# Patient Record
Sex: Male | Born: 1970 | Race: White | Hispanic: No | Marital: Single | Smoking: Never smoker
Health system: Southern US, Community
[De-identification: ages and names within clinical notes are randomized; demographics above are authoritative.]

## PROBLEM LIST (undated history)

## (undated) DIAGNOSIS — I10 Essential (primary) hypertension: Secondary | ICD-10-CM

## (undated) DIAGNOSIS — M109 Gout, unspecified: Secondary | ICD-10-CM

## (undated) DIAGNOSIS — I879 Disorder of vein, unspecified: Secondary | ICD-10-CM

## (undated) DIAGNOSIS — I82409 Acute embolism and thrombosis of unspecified deep veins of unspecified lower extremity: Secondary | ICD-10-CM

## (undated) DIAGNOSIS — I509 Heart failure, unspecified: Secondary | ICD-10-CM

## (undated) DIAGNOSIS — M1990 Unspecified osteoarthritis, unspecified site: Secondary | ICD-10-CM

## (undated) DIAGNOSIS — M797 Fibromyalgia: Secondary | ICD-10-CM

## (undated) DIAGNOSIS — K589 Irritable bowel syndrome without diarrhea: Secondary | ICD-10-CM

## (undated) DIAGNOSIS — F32A Depression, unspecified: Secondary | ICD-10-CM

## (undated) DIAGNOSIS — I2510 Atherosclerotic heart disease of native coronary artery without angina pectoris: Secondary | ICD-10-CM

## (undated) DIAGNOSIS — E119 Type 2 diabetes mellitus without complications: Secondary | ICD-10-CM

## (undated) DIAGNOSIS — R0600 Dyspnea, unspecified: Secondary | ICD-10-CM

## (undated) DIAGNOSIS — F419 Anxiety disorder, unspecified: Secondary | ICD-10-CM

## (undated) DIAGNOSIS — G4730 Sleep apnea, unspecified: Secondary | ICD-10-CM

## (undated) DIAGNOSIS — Z87442 Personal history of urinary calculi: Secondary | ICD-10-CM

## (undated) DIAGNOSIS — J45909 Unspecified asthma, uncomplicated: Secondary | ICD-10-CM

## (undated) HISTORY — DX: Acute embolism and thrombosis of unspecified deep veins of unspecified lower extremity: I82.409

## (undated) HISTORY — PX: FOOT SURGERY: SHX648

---

## 2015-06-02 DIAGNOSIS — F4321 Adjustment disorder with depressed mood: Secondary | ICD-10-CM | POA: Insufficient documentation

## 2015-06-02 DIAGNOSIS — E669 Obesity, unspecified: Secondary | ICD-10-CM | POA: Insufficient documentation

## 2015-06-02 DIAGNOSIS — J4530 Mild persistent asthma, uncomplicated: Secondary | ICD-10-CM | POA: Insufficient documentation

## 2015-06-02 DIAGNOSIS — M1A09X0 Idiopathic chronic gout, multiple sites, without tophus (tophi): Secondary | ICD-10-CM | POA: Insufficient documentation

## 2015-06-02 DIAGNOSIS — M797 Fibromyalgia: Secondary | ICD-10-CM | POA: Insufficient documentation

## 2015-06-02 DIAGNOSIS — E119 Type 2 diabetes mellitus without complications: Secondary | ICD-10-CM | POA: Insufficient documentation

## 2015-06-02 DIAGNOSIS — K580 Irritable bowel syndrome with diarrhea: Secondary | ICD-10-CM | POA: Insufficient documentation

## 2015-07-23 DIAGNOSIS — M109 Gout, unspecified: Secondary | ICD-10-CM | POA: Insufficient documentation

## 2015-07-23 DIAGNOSIS — R0683 Snoring: Secondary | ICD-10-CM | POA: Insufficient documentation

## 2015-07-23 DIAGNOSIS — I10 Essential (primary) hypertension: Secondary | ICD-10-CM | POA: Insufficient documentation

## 2015-07-23 DIAGNOSIS — K2100 Gastro-esophageal reflux disease with esophagitis, without bleeding: Secondary | ICD-10-CM | POA: Insufficient documentation

## 2015-07-23 DIAGNOSIS — G4733 Obstructive sleep apnea (adult) (pediatric): Secondary | ICD-10-CM | POA: Insufficient documentation

## 2015-11-30 IMAGING — US US EXTREM LOW VENOUS*L*
1 series · 13 of 24 positions shown · non-contrast
Comparison: None.

ADDENDUM:
The initially dictated report fail to include a description of the
mid aspect of the left greater saphenous vein where there are
internal echoes and only partial compressibility consistent with the
presence of thrombus.
CLINICAL DATA: Diffuse left leg pain for 6 years which has recently
worsened with swelling.



[Series 1: us extrem low venous*left* · 0.08mm/px · 13 of 31 slices shown]
[im 1/31]
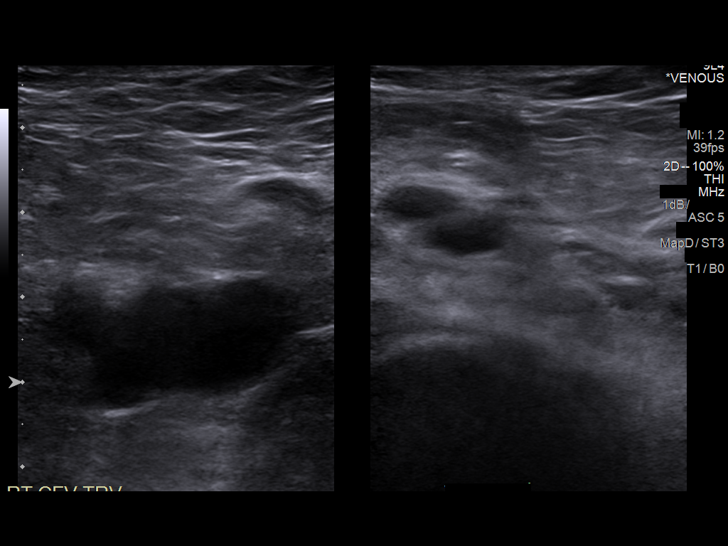
[im 3/31]
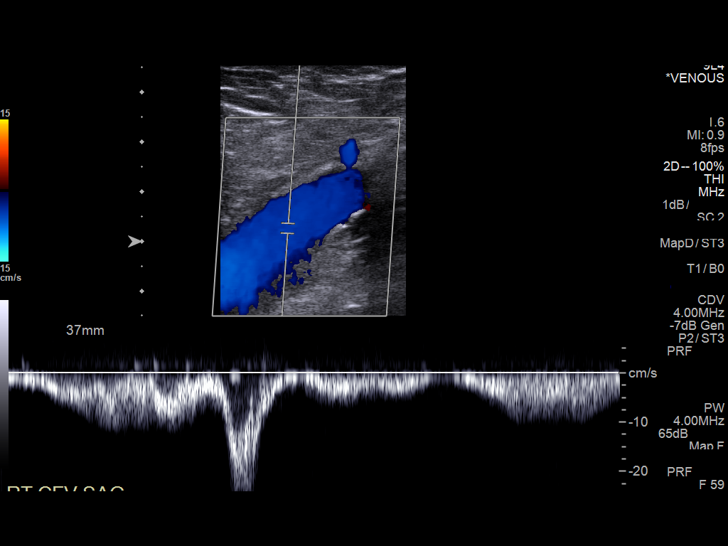
[im 6/31]
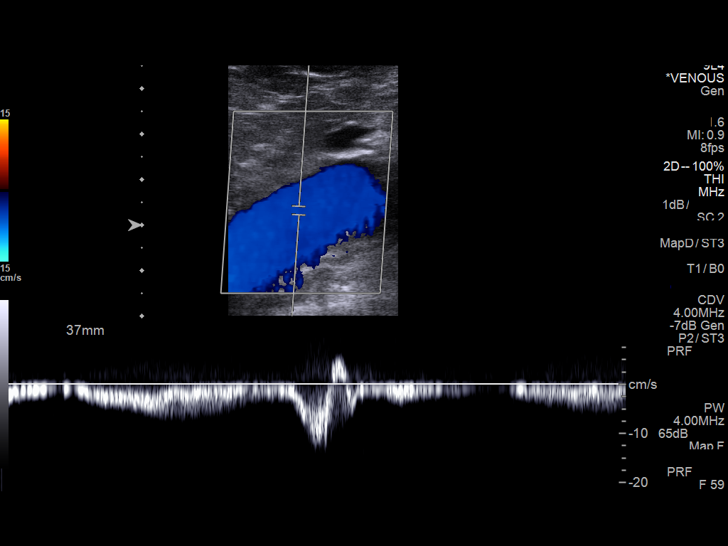
[im 8/31]
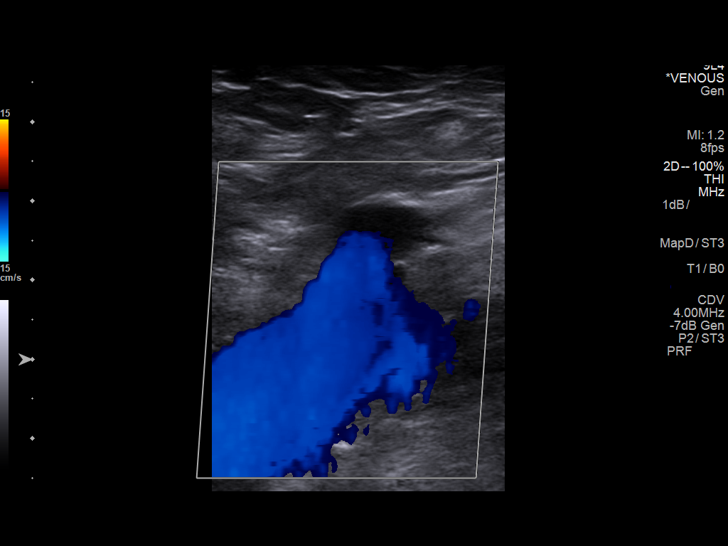
[im 11/31]
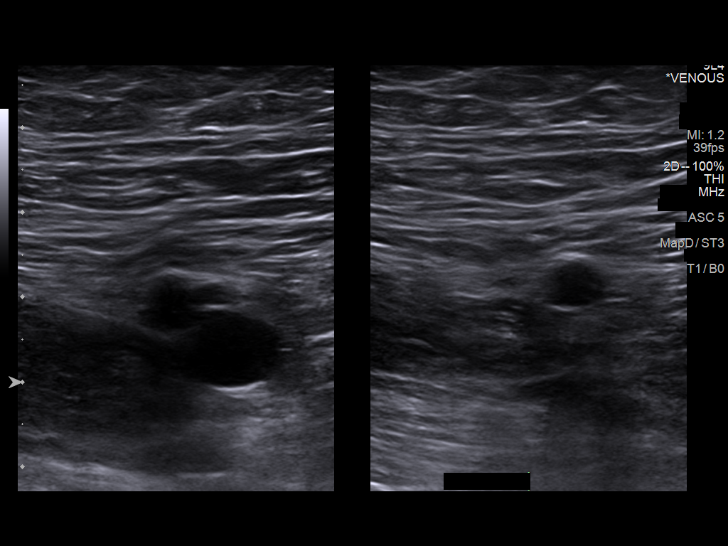
[im 14/31]
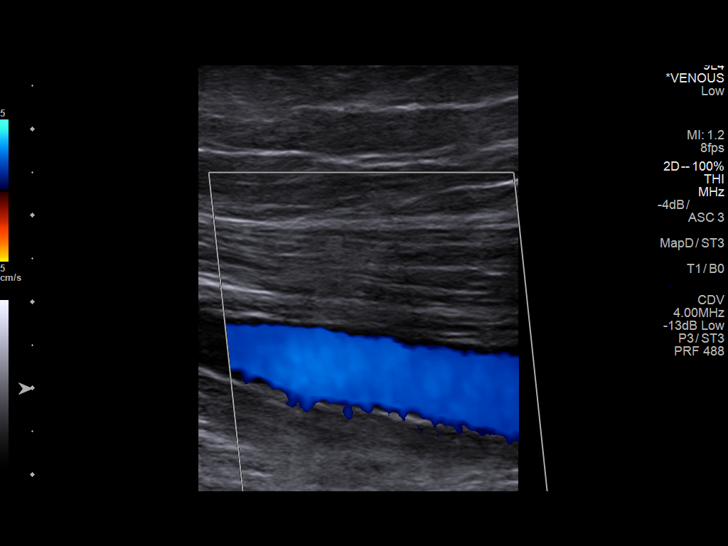
[im 16/31]
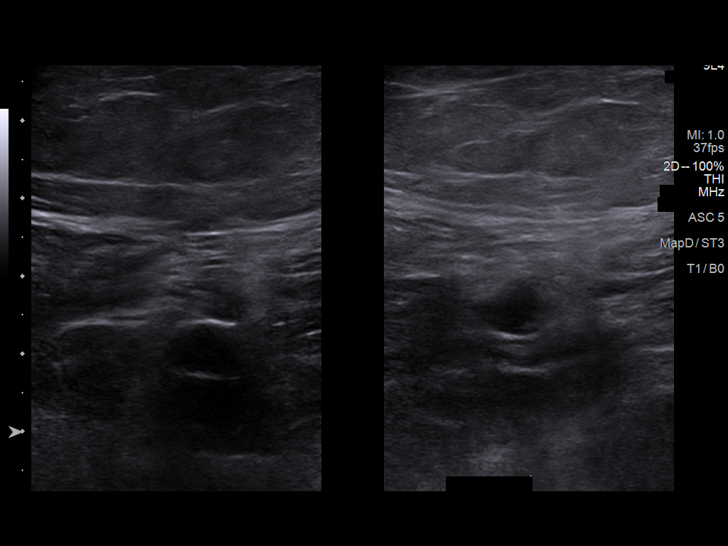
[im 17/31]
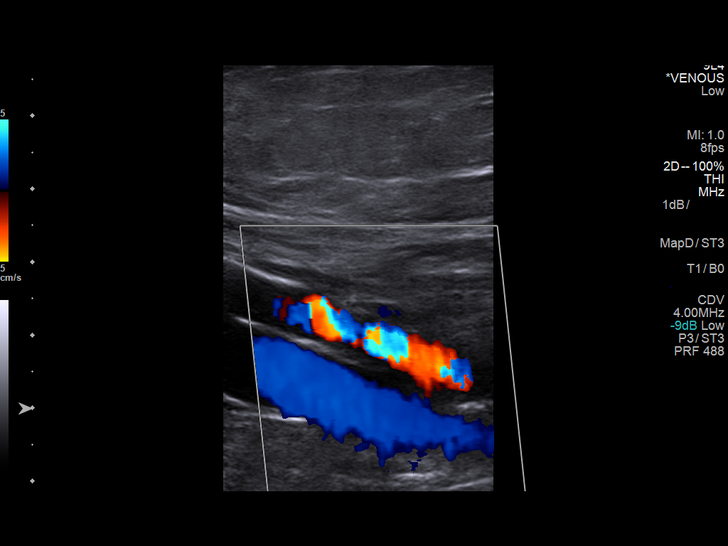
[im 20/31]
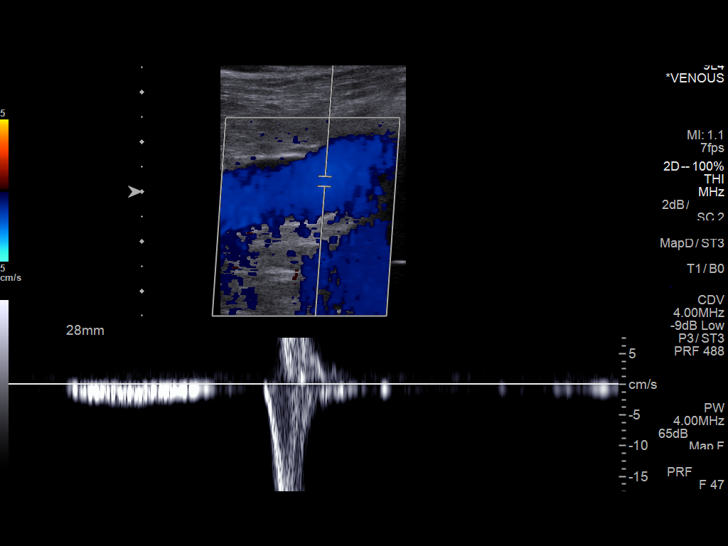
[im 23/31]
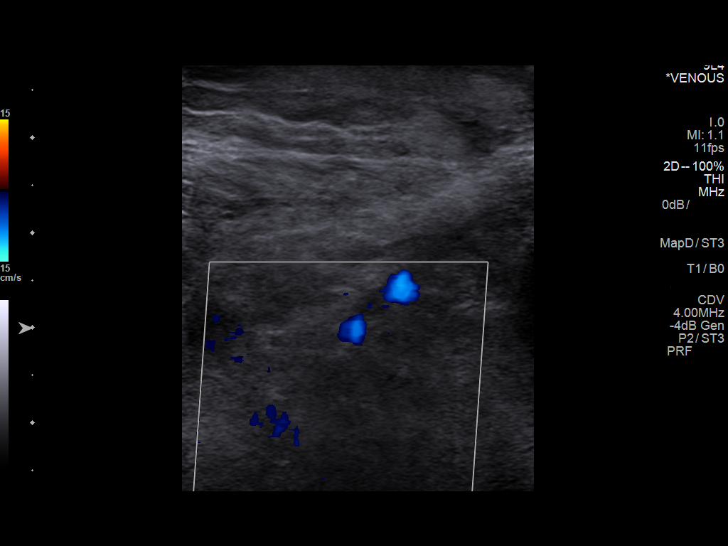
[im 25/31]
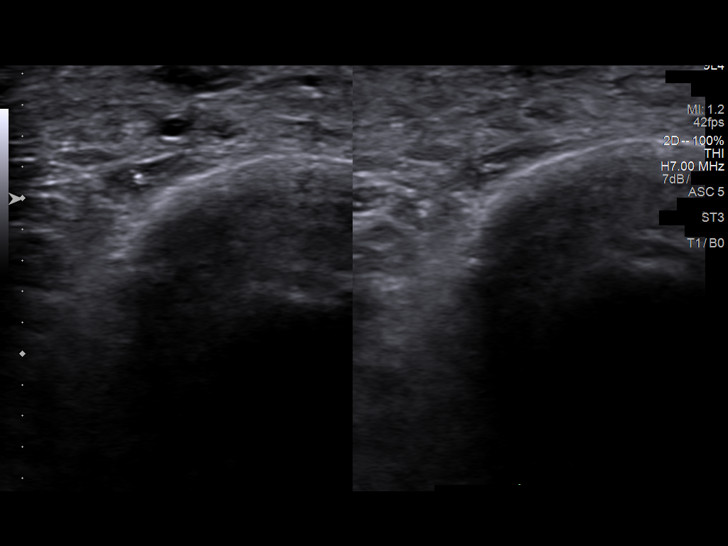
[im 28/31]
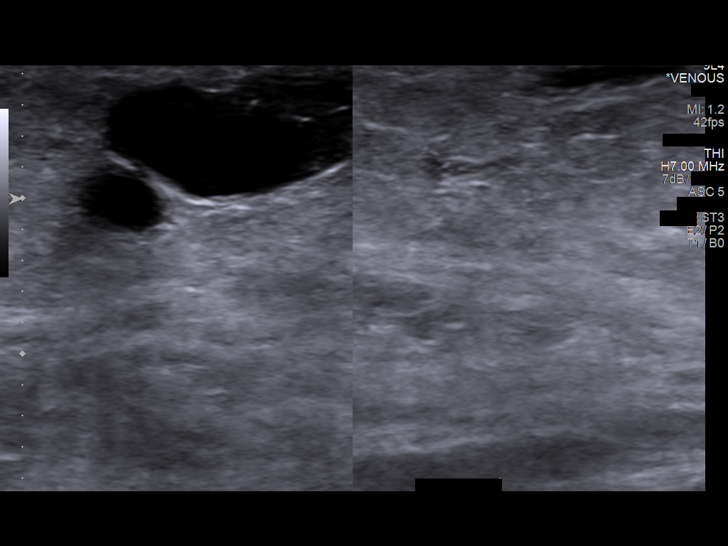
[im 31/31]
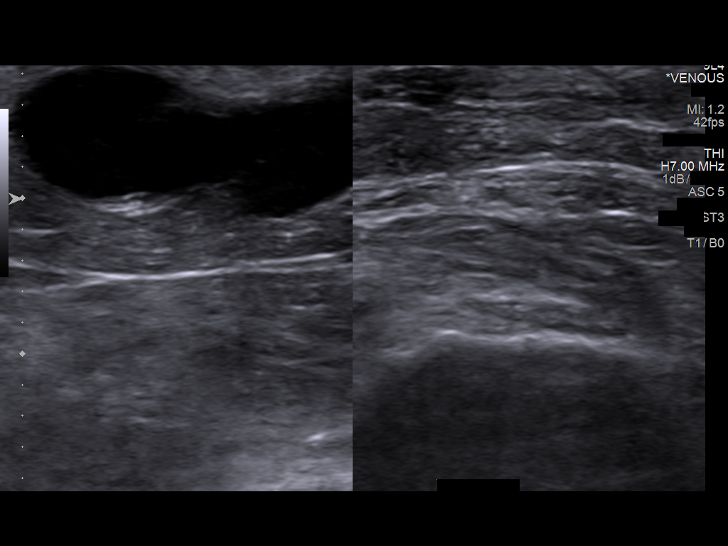

[13 of 24 positions shown; findings below may reference images not displayed]

FINDINGS: Contralateral Common Femoral Vein: Respiratory phasicity is normal
and symmetric with the symptomatic side. No evidence of thrombus.
Normal compressibility.

Common Femoral Vein: No evidence of thrombus. Normal
compressibility, respiratory phasicity and response to augmentation.

Saphenofemoral Junction: No evidence of thrombus. Normal
compressibility and flow on color Doppler imaging.

Profunda Femoral Vein: No evidence of thrombus. Normal
compressibility and flow on color Doppler imaging.

Femoral Vein: No evidence of thrombus. Normal compressibility,
respiratory phasicity and response to augmentation.

Popliteal Vein: No evidence of thrombus. Normal compressibility,
respiratory phasicity and response to augmentation.

Calf Veins: No evidence of thrombus. Normal compressibility and flow
on color Doppler imaging.

Superficial Great Saphenous Vein: No evidence of thrombus. Normal
compressibility.

Venous Reflux:  None.

Other Findings:  Multiple compressible varicosities are present.
IMPRESSION: Negative for deep venous thrombosis.

Varicose veins without evidence of thrombophlebitis.

## 2017-03-16 ENCOUNTER — Emergency Department (HOSPITAL_BASED_OUTPATIENT_CLINIC_OR_DEPARTMENT_OTHER)
Admission: EM | Admit: 2017-03-16 | Discharge: 2017-03-16 | Disposition: A | Discharge status: Home / Self Care | Payer: Self-pay | Attending: Emergency Medicine | Admitting: Emergency Medicine

## 2017-03-16 ENCOUNTER — Encounter (HOSPITAL_BASED_OUTPATIENT_CLINIC_OR_DEPARTMENT_OTHER): Payer: Self-pay | Admitting: *Deleted

## 2017-03-16 ENCOUNTER — Other Ambulatory Visit: Payer: Self-pay

## 2017-03-16 ENCOUNTER — Emergency Department (HOSPITAL_BASED_OUTPATIENT_CLINIC_OR_DEPARTMENT_OTHER): Payer: Self-pay

## 2017-03-16 DIAGNOSIS — E11621 Type 2 diabetes mellitus with foot ulcer: Secondary | ICD-10-CM | POA: Insufficient documentation

## 2017-03-16 DIAGNOSIS — L97421 Non-pressure chronic ulcer of left heel and midfoot limited to breakdown of skin: Secondary | ICD-10-CM | POA: Insufficient documentation

## 2017-03-16 DIAGNOSIS — Z79899 Other long term (current) drug therapy: Secondary | ICD-10-CM | POA: Insufficient documentation

## 2017-03-16 DIAGNOSIS — I110 Hypertensive heart disease with heart failure: Secondary | ICD-10-CM | POA: Insufficient documentation

## 2017-03-16 DIAGNOSIS — I509 Heart failure, unspecified: Secondary | ICD-10-CM | POA: Insufficient documentation

## 2017-03-16 DIAGNOSIS — J45909 Unspecified asthma, uncomplicated: Secondary | ICD-10-CM | POA: Insufficient documentation

## 2017-03-16 DIAGNOSIS — Z7984 Long term (current) use of oral hypoglycemic drugs: Secondary | ICD-10-CM | POA: Insufficient documentation

## 2017-03-16 HISTORY — DX: Fibromyalgia: M79.7

## 2017-03-16 HISTORY — DX: Disorder of vein, unspecified: I87.9

## 2017-03-16 HISTORY — DX: Heart failure, unspecified: I50.9

## 2017-03-16 HISTORY — DX: Irritable bowel syndrome, unspecified: K58.9

## 2017-03-16 HISTORY — DX: Essential (primary) hypertension: I10

## 2017-03-16 HISTORY — DX: Gout, unspecified: M10.9

## 2017-03-16 HISTORY — DX: Type 2 diabetes mellitus without complications: E11.9

## 2017-03-16 HISTORY — DX: Unspecified asthma, uncomplicated: J45.909

## 2017-03-16 HISTORY — DX: Unspecified osteoarthritis, unspecified site: M19.90

## 2017-03-16 LAB — CBC WITH DIFFERENTIAL/PLATELET
BASOS PCT: 0 %
Basophils Absolute: 0 10*3/uL (ref 0.0–0.1)
EOS ABS: 0 10*3/uL (ref 0.0–0.7)
Eosinophils Relative: 0 %
HCT: 41.8 % (ref 39.0–52.0)
Hemoglobin: 15 g/dL (ref 13.0–17.0)
Lymphocytes Relative: 7 %
Lymphs Abs: 1.3 10*3/uL (ref 0.7–4.0)
MCH: 32.6 pg (ref 26.0–34.0)
MCHC: 35.9 g/dL (ref 30.0–36.0)
MCV: 90.9 fL (ref 78.0–100.0)
Monocytes Absolute: 0.4 10*3/uL (ref 0.1–1.0)
Monocytes Relative: 2 %
NEUTROS PCT: 91 %
Neutro Abs: 16.4 10*3/uL — ABNORMAL HIGH (ref 1.7–7.7)
Platelets: 289 10*3/uL (ref 150–400)
RBC: 4.6 MIL/uL (ref 4.22–5.81)
RDW: 12.2 % (ref 11.5–15.5)
WBC: 18.1 10*3/uL — ABNORMAL HIGH (ref 4.0–10.5)

## 2017-03-16 LAB — BASIC METABOLIC PANEL
Anion gap: 16 — ABNORMAL HIGH (ref 5–15)
BUN: 20 mg/dL (ref 6–20)
CALCIUM: 9.3 mg/dL (ref 8.9–10.3)
CO2: 25 mmol/L (ref 22–32)
CREATININE: 1.05 mg/dL (ref 0.61–1.24)
Chloride: 89 mmol/L — ABNORMAL LOW (ref 101–111)
GFR calc Af Amer: >60 mL/min (ref >60)
Glucose, Bld: 477 mg/dL — ABNORMAL HIGH (ref 65–99)
Potassium: 4 mmol/L (ref 3.5–5.1)
SODIUM: 130 mmol/L — AB (ref 135–145)

## 2017-03-16 IMAGING — DX DG FOOT COMPLETE 3+V*L*
3 series · 3 of 3 positions shown · non-contrast
Comparison: None.

CLINICAL DATA: Diabetes.  Plantar ulcer at fifth metatarsal bone..

EXAM:
LEFT FOOT - COMPLETE 3+ VIEW

[foot ap]
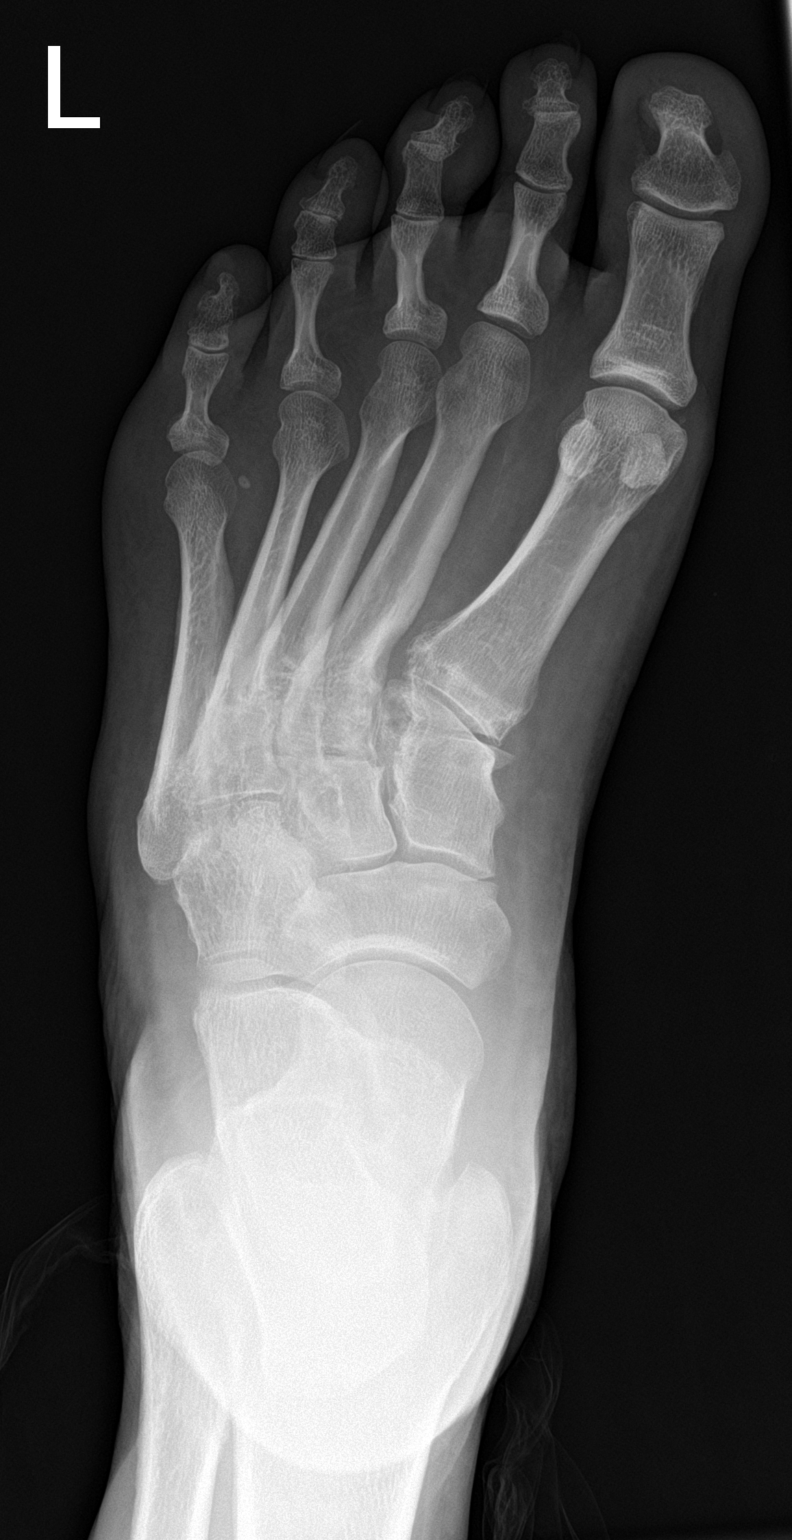

[foot obl]
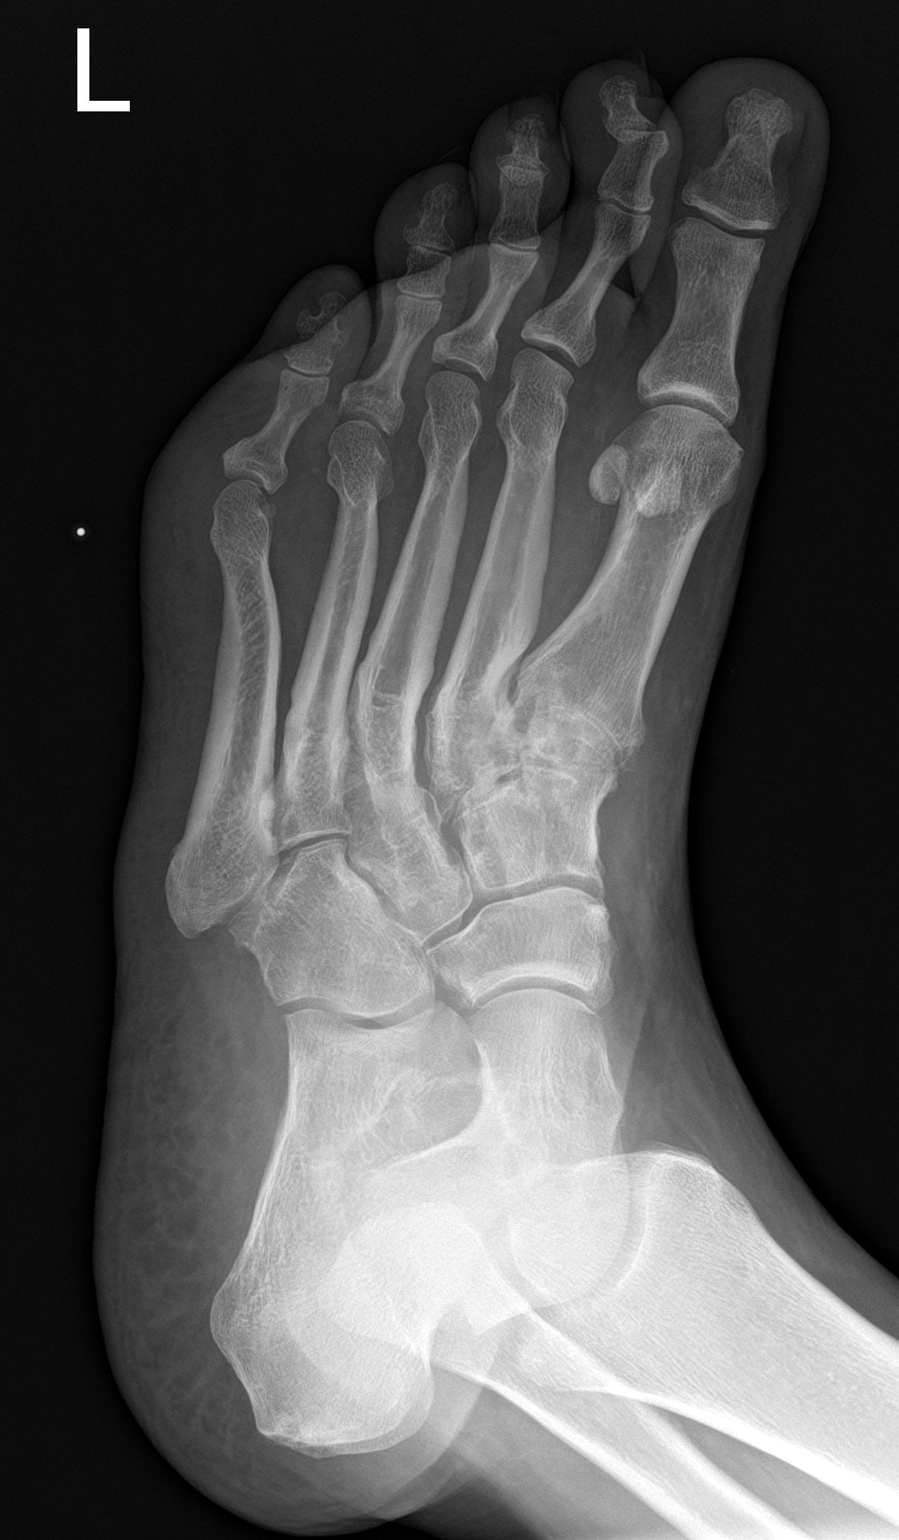

[foot lat]
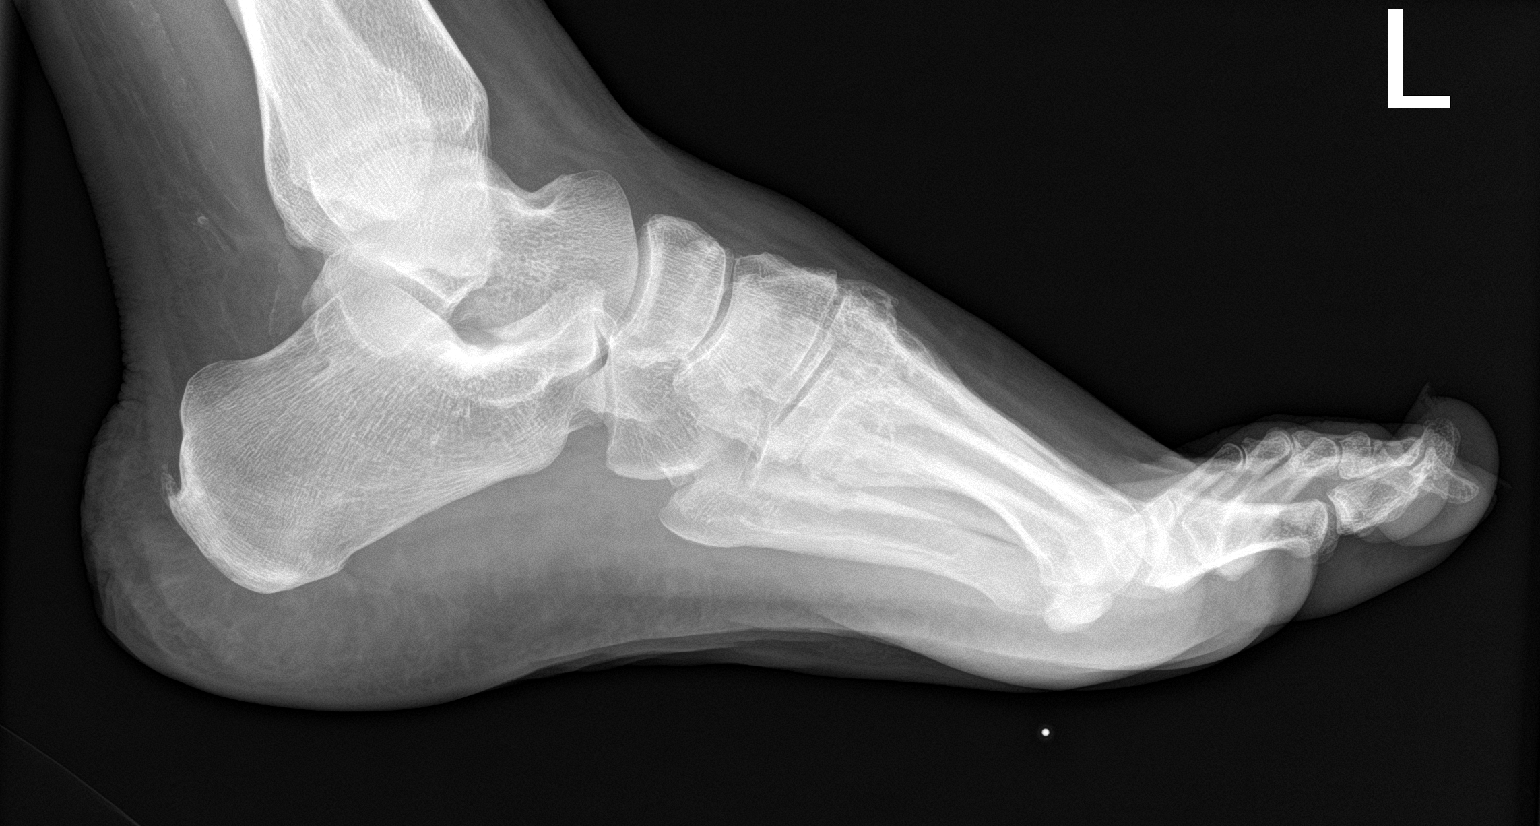

[3 of 3 positions shown; findings below may reference images not displayed]

FINDINGS: A fiducial marker was placed over the area of concern. No underlying
bone erosion identified. No fracture or dislocation. There is no
evidence of fracture or dislocation. There is no evidence of
arthropathy or other focal bone abnormality. Soft tissue swelling
overlying the distal aspect of the fifth metatarsal bone identified.
IMPRESSION: 1. Low lateral soft tissue swelling overlying the fifth metatarsal
bone without underlying osseous abnormality.

## 2017-03-16 MED ORDER — CIPROFLOXACIN HCL 500 MG PO TABS: 500.0000 mg | ORAL_TABLET | Freq: Two times a day (BID) | ORAL | 0 refills | Status: DC

## 2017-03-16 MED ORDER — METFORMIN HCL 1000 MG PO TABS: 1000.0000 mg | ORAL_TABLET | Freq: Two times a day (BID) | ORAL | 0 refills | Status: DC

## 2017-03-16 MED ORDER — TRAMADOL HCL 50 MG PO TABS: 50.0000 mg | ORAL_TABLET | Freq: Four times a day (QID) | ORAL | 0 refills | Status: DC | PRN

## 2017-03-16 NOTE — ED Provider Notes (Signed)
[facility] EMERGENCY DEPARTMENT Provider Note   CSN: [id number] Arrival date & time: 03/16/17  1434     History   Chief Complaint Chief Complaint  Patient presents with  . Foot Pain    HPI Micheal Bell is a 47 y.o. male.  Complaint is "my foot hurts"  HPI 47 year old male.  History of diabetes for the last 2 years.  On Glucophage 1000 mg once per day, and glipizide 10 mg once per day.  Said some pain with walking on his left foot for the last few weeks.  No drainage.  No erythema.  No injury.  No history of diabetic foot ulcers.  Past Medical History:  Diagnosis Date  . Arthritis   . Asthma   . CHF (congestive heart failure) (HCC)   . Diabetes mellitus without complication (HCC)   . Fibromyalgia   . Gout   . Hypertension   . IBS (irritable bowel syndrome)   . Vein disorder     There are no active problems to display for this patient.       Home Medications    Prior to Admission medications   Medication Sig Start Date End Date Taking? Authorizing Provider  albuterol (PROVENTIL HFA;VENTOLIN HFA) 108 (90 Base) MCG/ACT inhaler Inhale into the lungs every 6 (six) hours as needed for wheezing or shortness of breath.   Yes [provider]  allopurinol (ZYLOPRIM) 300 MG tablet Take 300 mg by mouth daily.   Yes [provider]  chlorthalidone (HYGROTON) 25 MG tablet Take 25 mg by mouth daily.   Yes [provider]  citalopram (CELEXA) 40 MG tablet Take 40 mg by mouth daily.   Yes [provider]  cyclobenzaprine (FLEXERIL) 10 MG tablet Take 10 mg by mouth 3 (three) times daily as needed for muscle spasms.   Yes [provider]  Fluticasone Propionate, Inhal, (FLOVENT IN) Inhale into the lungs.   Yes [provider]  furosemide (LASIX) 20 MG tablet Take 20 mg by mouth.   Yes [provider]  glipiZIDE (GLUCOTROL) 10 MG tablet Take 10 mg by mouth daily before breakfast.   Yes [provider]  HYDROcodone-acetaminophen (NORCO/VICODIN) 5-325 MG tablet Take 1 tablet by mouth every 6 (six) hours as needed for moderate pain.   Yes [provider]  meloxicam (MOBIC) 15 MG tablet Take 15 mg by mouth daily.   Yes [provider]  mometasone-formoterol (DULERA) 100-5 MCG/ACT AERO Inhale 2 puffs into the lungs 2 (two) times daily.   Yes [provider]  montelukast (SINGULAIR) 10 MG tablet Take 10 mg by mouth at bedtime.   Yes [provider]  nitroGLYCERIN (NITRODUR - DOSED IN MG/24 HR) 0.2 mg/hr patch Place 0.2 mg onto the skin daily.   Yes [provider]  nortriptyline (PAMELOR) 25 MG capsule Take 25 mg by mouth at bedtime.   Yes [provider]  pantoprazole (PROTONIX) 40 MG tablet Take 40 mg by mouth daily.   Yes [provider]  predniSONE (DELTASONE) 10 MG tablet Take 10 mg by mouth daily with breakfast.   Yes [provider]  verapamil (CALAN-SR) 240 MG CR tablet Take 240 mg by mouth at bedtime.   Yes [provider]  zolpidem (AMBIEN) 10 MG tablet Take 10 mg by mouth at bedtime as needed for sleep.   Yes [provider]  ciprofloxacin (CIPRO) 500 MG tablet Take 1 tablet (500 mg total) by mouth every 12 (twelve)  hours. 03/16/17   Rolland Porter, MD  metFORMIN (GLUCOPHAGE) 1000 MG tablet Take 1 tablet (1,000 mg total) by mouth 2 (two) times daily. 03/16/17   Rolland Porter, MD  traMADol (ULTRAM) 50 MG tablet Take 1 tablet (50 mg total) by mouth every 6 (six) hours as needed. 03/16/17   Rolland Porter, MD    Family History No family history on file.  Social History Social History   Tobacco Use  . Smoking status: Not on file  Substance Use Topics  . Alcohol use: Not on file  . Drug use: Not on file     Allergies   Azithromycin and Lodine [etodolac]   Review of Systems Review of Systems  Constitutional: Negative for appetite change, chills, diaphoresis, fatigue and fever.  HENT: Negative for  mouth sores, sore throat and trouble swallowing.   Eyes: Negative for visual disturbance.  Respiratory: Negative for cough, chest tightness, shortness of breath and wheezing.   Cardiovascular: Negative for chest pain.  Gastrointestinal: Negative for abdominal distention, abdominal pain, diarrhea, nausea and vomiting.  Endocrine: Negative for polydipsia, polyphagia and polyuria.  Genitourinary: Negative for dysuria, frequency and hematuria.  Musculoskeletal: Negative for gait problem.       Left lateral plantar foot pain  Skin: Negative for color change, pallor and rash.  Neurological: Negative for dizziness, syncope, light-headedness and headaches.  Hematological: Does not bruise/bleed easily.  Psychiatric/Behavioral: Negative for behavioral problems and confusion.     Physical Exam Updated Vital Signs BP 132/83 (BP Location: Left Arm)   Pulse 94   Temp 98 F (36.7 C) (Oral)   Resp 18   Ht 6\' 2"  (1.88 m)   Wt (!) 145.2 kg (320 lb)   SpO2 95%   BMI 41.09 kg/m   Physical Exam  Constitutional: He is oriented to person, place, and time. He appears well-developed and well-nourished. No distress.  HENT:  Head: Normocephalic.  Eyes: Conjunctivae are normal. Pupils are equal, round, and reactive to light. No scleral icterus.  Neck: Normal range of motion. Neck supple. No thyromegaly present.  Cardiovascular: Normal rate and regular rhythm. Exam reveals no gallop and no friction rub.  No murmur heard. Pulmonary/Chest: Effort normal and breath sounds normal. No respiratory distress. He has no wheezes. He has no rales.  Abdominal: Soft. Bowel sounds are normal. He exhibits no distension. There is no tenderness. There is no rebound.  Musculoskeletal: Normal range of motion.  Thick callus with some minimal surrounding erythema plantar aspect left fifth metatarsal distal.  No soft tissue swelling.  No crepitus.  No lymphangitis.  He has good sensation in the foot.  States he occasionally  gets pins and needles but is not have neuropathy  Neurological: He is alert and oriented to person, place, and time.  Skin: Skin is warm and dry. No rash noted.  Psychiatric: He has a normal mood and affect. His behavior is normal.     ED Treatments / Results  Labs (all labs ordered are listed, but only abnormal results are displayed) Labs Reviewed  CBC WITH DIFFERENTIAL/PLATELET - Abnormal; Notable for the following components:      Result Value   WBC 18.1 (*)    Neutro Abs 16.4 (*)    All other components within normal limits  BASIC METABOLIC PANEL - Abnormal; Notable for the following components:   Sodium 130 (*)    Chloride 89 (*)    Glucose, Bld 477 (*)    Anion gap 16 (*)    All  other components within normal limits    EKG  EKG Interpretation None       Radiology Dg Foot Complete Left  Result Date: 03/16/2017 CLINICAL DATA:  Diabetes.  Plantar ulcer at fifth metatarsal bone. EXAM: LEFT FOOT - COMPLETE 3+ VIEW COMPARISON:  None. FINDINGS: A fiducial marker was placed over the area of concern. No underlying bone erosion identified. No fracture or dislocation. There is no evidence of fracture or dislocation. There is no evidence of arthropathy or other focal bone abnormality. Soft tissue swelling overlying the distal aspect of the fifth metatarsal bone identified. IMPRESSION: 1. Low lateral soft tissue swelling overlying the fifth metatarsal bone without underlying osseous abnormality. Electronically Signed   By: Signa Kell M.D.   On: 03/16/2017 17:01    Procedures Procedures (including critical care time)  Medications Ordered in ED Medications - No data to display   Initial Impression / Assessment and Plan / ED Course  I have reviewed the triage vital signs and the nursing notes.  Pertinent labs & imaging results that were available during my care of the patient were reviewed by me and considered in my medical decision making (see chart for details).      X-rays shows soft tissue swelling and thickened skin.  No gas.  No foot bony abnormalities.  He is hyperglycemic but not acidotic.  Slight leukocytosis.  He does not appear ill or toxic.  Do not feel his sugars need acute intervention at this time.  Is taking 10 mg of prednisone a day for rheumatoid arthritis and has been on this dose for some time.  Plan increase Glucophage to twice per day.  New prescription given.  Tramadol for pain.  Of asked him to obtain a insert for his foot to take pressure off of this area and have given him referral to podiatry to discuss possibility of orthotic to offload pressure from his foot ulcer.  If signs of infection worsen with drainage redness or other symptoms recheck here.  Cipro times 10 days twice daily.  Final Clinical Impressions(s) / ED Diagnoses   Final diagnoses:  Diabetic ulcer of left midfoot associated with type 2 diabetes mellitus, limited to breakdown of skin [facility])    ED Discharge Orders        Ordered    ciprofloxacin (CIPRO) 500 MG tablet  Every 12 hours     03/16/17 1732    traMADol (ULTRAM) 50 MG tablet  Every 6 hours PRN     03/16/17 1732    metFORMIN (GLUCOPHAGE) 1000 MG tablet  2 times daily     03/16/17 1732       Rolland Porter, MD 03/16/17 1740

## 2017-03-16 NOTE — Discharge Instructions (Signed)
Call Dr. Ardelle Anton for follow up appointment. Use foot inserts to keep pressure off of this area. Increase your Glucophage to a.m. and p.m. Increase your Glucophage to morning and evening. New prescription given.

## 2017-03-16 NOTE — ED Triage Notes (Signed)
Pt. Reports he has an ulcer on the L lower foot.  Noted Pt. Reports pain for a week or two on the bottom side of the L foot just under the L 5th toe.  There is a callus purple/red in color with no drainage noted to the area.  Pt. Is a diabetic oral dependant.

## 2017-03-17 ENCOUNTER — Telehealth: Payer: Self-pay | Admitting: Podiatry

## 2017-03-17 NOTE — Telephone Encounter (Signed)
ED referral per, pt does not have insurance, or a way to pay. Hes going to call to speak with his Caseworker about full Medicaid. Currently has [employer].

## 2017-03-19 ENCOUNTER — Ambulatory Visit (HOSPITAL_BASED_OUTPATIENT_CLINIC_OR_DEPARTMENT_OTHER)
Admission: RE | Admit: 2017-03-19 | Discharge: 2017-03-19 | Disposition: A | Discharge status: Home / Self Care | Payer: Medicaid Other | Source: Ambulatory Visit | Attending: Podiatry | Admitting: Podiatry

## 2017-03-19 ENCOUNTER — Ambulatory Visit (INDEPENDENT_AMBULATORY_CARE_PROVIDER_SITE_OTHER): Payer: Self-pay | Admitting: Podiatry

## 2017-03-19 ENCOUNTER — Encounter: Payer: Self-pay | Admitting: Sports Medicine

## 2017-03-19 ENCOUNTER — Encounter: Payer: Self-pay | Admitting: Podiatry

## 2017-03-19 ENCOUNTER — Telehealth: Payer: Self-pay | Admitting: *Deleted

## 2017-03-19 ENCOUNTER — Other Ambulatory Visit: Payer: Self-pay | Admitting: Podiatry

## 2017-03-19 DIAGNOSIS — R609 Edema, unspecified: Secondary | ICD-10-CM | POA: Insufficient documentation

## 2017-03-19 DIAGNOSIS — L02619 Cutaneous abscess of unspecified foot: Secondary | ICD-10-CM

## 2017-03-19 DIAGNOSIS — I739 Peripheral vascular disease, unspecified: Secondary | ICD-10-CM

## 2017-03-19 DIAGNOSIS — M79662 Pain in left lower leg: Secondary | ICD-10-CM

## 2017-03-19 DIAGNOSIS — L03119 Cellulitis of unspecified part of limb: Secondary | ICD-10-CM

## 2017-03-19 DIAGNOSIS — I8390 Asymptomatic varicose veins of unspecified lower extremity: Secondary | ICD-10-CM | POA: Insufficient documentation

## 2017-03-19 DIAGNOSIS — L97501 Non-pressure chronic ulcer of other part of unspecified foot limited to breakdown of skin: Secondary | ICD-10-CM

## 2017-03-19 MED ORDER — AMOXICILLIN-POT CLAVULANATE 875-125 MG PO TABS: 1.0000 | ORAL_TABLET | Freq: Two times a day (BID) | ORAL | 0 refills | Status: DC

## 2017-03-19 NOTE — Telephone Encounter (Signed)
[facility] scheduled pt for appt today 7:00pm to arrive 6:45pm. I gave pt a Dr. Ardelle Anton card with the appt time.

## 2017-03-19 NOTE — Progress Notes (Signed)
Micheal Bell from imaging called with venous duplex results. Negative for DVT. I advise patient rest, ice, elevation and Tylenol or Motrin as needed for pain. Patient desires note and report to be sent to PCP. I advised patient that I will notify Dr. Ardelle Anton who will send note and reports. Patient expressed understanding and I advised him to call answering service if he has any problems over the weekend.  Dr. Marylene Land

## 2017-03-19 NOTE — Progress Notes (Signed)
Subjective:    Patient ID: Micheal Bell, male    DOB: 1971/02/06, 47 y.o.   MRN: [id number]  HPI Selassie presents the office today for concerns of a callus area which is painful to the left foot submetatarsal 5 which is been ongoing for about 2 weeks.  He states is been getting worse he went to the ER he was prescribed ciprofloxacin.  He states that the area has not improved with this antibiotic.  He has been using a callus pad for offloading.  He denies any drainage or pus but he has had some swelling to the left foot and leg.  Is also noticed some bruising to the back of the left leg.  Overall he feels well and he denies any systemic complaints such as fevers, chills, nausea, vomiting.  No calf pain, chest pain, shortness of breath.  His last A1c was 14.   Review of Systems  Constitutional: Positive for activity change and fatigue.  Musculoskeletal: Positive for arthralgias and gait problem.  Skin: Positive for wound.  All other systems reviewed and are negative.  Past Medical History:  Diagnosis Date  . Arthritis   . Asthma   . CHF (congestive heart failure) (HCC)   . Diabetes mellitus without complication (HCC)   . Fibromyalgia   . Gout   . Hypertension   . IBS (irritable bowel syndrome)   . Vein disorder        Current Outpatient Medications:  .  ciprofloxacin (CIPRO) 500 MG tablet, Take 500 mg by mouth 2 (two) times daily., Disp: , Rfl:  .  albuterol (PROVENTIL HFA;VENTOLIN HFA) 108 (90 Base) MCG/ACT inhaler, Inhale into the lungs every 6 (six) hours as needed for wheezing or shortness of breath., Disp: , Rfl:  .  allopurinol (ZYLOPRIM) 300 MG tablet, Take 300 mg by mouth daily., Disp: , Rfl:  .  amoxicillin-clavulanate (AUGMENTIN) 875-125 MG tablet, Take 1 tablet by mouth 2 (two) times daily., Disp: 20 tablet, Rfl: 0 .  chlorthalidone (HYGROTON) 25 MG tablet, Take 25 mg by mouth daily., Disp: , Rfl:  .  citalopram (CELEXA) 40 MG tablet, Take 40 mg by mouth daily., Disp: ,  Rfl:  .  cyclobenzaprine (FLEXERIL) 10 MG tablet, Take 10 mg by mouth 3 (three) times daily as needed for muscle spasms., Disp: , Rfl:  .  Fluticasone Propionate, Inhal, (FLOVENT IN), Inhale into the lungs., Disp: , Rfl:  .  furosemide (LASIX) 20 MG tablet, Take 20 mg by mouth., Disp: , Rfl:  .  glipiZIDE (GLUCOTROL) 10 MG tablet, Take 10 mg by mouth daily before breakfast., Disp: , Rfl:  .  HYDROcodone-acetaminophen (NORCO/VICODIN) 5-325 MG tablet, Take 1 tablet by mouth every 6 (six) hours as needed for moderate pain., Disp: , Rfl:  .  meloxicam (MOBIC) 15 MG tablet, Take 15 mg by mouth daily., Disp: , Rfl:  .  metFORMIN (GLUCOPHAGE) 1000 MG tablet, Take 1 tablet (1,000 mg total) by mouth 2 (two) times daily., Disp: 60 tablet, Rfl: 0 .  mometasone-formoterol (DULERA) 100-5 MCG/ACT AERO, Inhale 2 puffs into the lungs 2 (two) times daily., Disp: , Rfl:  .  montelukast (SINGULAIR) 10 MG tablet, Take 10 mg by mouth at bedtime., Disp: , Rfl:  .  nitroGLYCERIN (NITRODUR - DOSED IN MG/24 HR) 0.2 mg/hr patch, Place 0.2 mg onto the skin daily., Disp: , Rfl:  .  nortriptyline (PAMELOR) 25 MG capsule, Take 25 mg by mouth at bedtime., Disp: , Rfl:  .  pantoprazole (PROTONIX) 40 MG tablet, Take 40 mg by mouth daily., Disp: , Rfl:  .  predniSONE (DELTASONE) 10 MG tablet, Take 10 mg by mouth daily with breakfast., Disp: , Rfl:  .  traMADol (ULTRAM) 50 MG tablet, Take 1 tablet (50 mg total) by mouth every 6 (six) hours as needed., Disp: 15 tablet, Rfl: 0 .  verapamil (CALAN-SR) 240 MG CR tablet, Take 240 mg by mouth at bedtime., Disp: , Rfl:  .  zolpidem (AMBIEN) 10 MG tablet, Take 10 mg by mouth at bedtime as needed for sleep., Disp: , Rfl:   Allergies  Allergen Reactions  . Azithromycin   . Lodine [Etodolac] Anaphylaxis    Social History   Socioeconomic History  . Marital status: Single    Spouse name: Not on file  . Number of children: Not on file  . Years of education: Not on file  . Highest  education level: Not on file  Social Needs  . Financial resource strain: Not on file  . Food insecurity - worry: Not on file  . Food insecurity - inability: Not on file  . Transportation needs - medical: Not on file  . Transportation needs - non-medical: Not on file  Occupational History  . Not on file  Tobacco Use  . Smoking status: Unknown If Ever Smoked  . Smokeless tobacco: Never Used  Substance and Sexual Activity  . Alcohol use: Not on file  . Drug use: Not on file  . Sexual activity: Not on file  Other Topics Concern  . Not on file  Social History Narrative  . Not on file         Objective:   Physical Exam General: AAO x3, NAD  Dermatological: On the left foot submetatarsal 5.  Thick hyperkeratotic lesion with swelling around the area.  Upon debridement there was purulence expressed underneath the callus and there was a granular wound present measuring approximately 2 x 2 cm. There is no probing, undermining or tunneling.  No surrounding erythema but there is an increase in warmth to the foot and there is swelling to the left foot and leg.  No fluctuation or crepitation.  There is no malodor.  No other open lesions or pre-ulcerative lesions.  Vascular: Dorsalis Pedis artery and Posterior Tibial artery pedal pulses are 2/4 bilateral with immedate capillary fill time.  There is mild swelling to the left leg but there is no pain with calf compression there is bruising to the back of the left knee.  Varicose veins are present  Neruologic: Sensation decreased with Dorann Ou monofilament  Musculoskeletal: Tenderness to the hyperkeratotic lesion prior to debridement left foot muscular strength 5/5 in all groups tested bilateral.  Gait: Unassisted, Nonantalgic.      Assessment & Plan:  47 year old male abscess, cellulitis left foot with ulceration -Treatment options discussed including all alternatives, risks, and complications -Etiology of symptoms were  discussed -Reviewed the x-rays from the emergency department. -After verbal consent was obtained I sharply debrided the hyperkeratotic tissue and the wound.  There was purulence expressed and this was cultured today.  Area was cleaned.  The wound appeared to be superficial.  Given the continued cellulitis and increased white blood cell count in emergency department will start antibiotics again.  Finish Cipro and also prescribed Augmentin. -Recommend small amount of antibiotic ointment dressing changes daily to the area.  Offloading at all times. -We will get a venous duplex to rule out DVT -ABI was done in the office on  the left side was 1.24 the right was 1.23. -Monitor any signs or symptoms of worsening infection return to the ER should any occur.  If symptoms worsen he will likely need admission to the hospital IV antibiotics. -Follow-up in 1 week or sooner if needed.  Call any questions or concerns.  Vivi Barrack DPM

## 2017-03-23 LAB — WOUND CULTURE
MICRO NUMBER:: 90108930
SPECIMEN QUALITY: ADEQUATE

## 2017-03-24 ENCOUNTER — Telehealth: Payer: Self-pay | Admitting: *Deleted

## 2017-03-24 NOTE — Telephone Encounter (Signed)
-----   Message from Matthew R Wagoner, DPM sent at 03/24/2017  4:44 PM EST ----- Val please let him know they did an addendum to show he does have a superficial clot but it is not in a deep vein. Follow-up with PCP, continue motrin as needed but likely no need for anticoagulation. If he develops any calf pain, swelling increase return to the ER. 

## 2017-03-24 NOTE — Telephone Encounter (Signed)
Left message to call concerning the final results of the circulation test.

## 2017-03-26 ENCOUNTER — Encounter: Payer: Self-pay | Admitting: *Deleted

## 2017-03-26 NOTE — Telephone Encounter (Signed)
Mailed letter informing pt the circulation test results were available.

## 2017-03-26 NOTE — Telephone Encounter (Signed)
Left message informing pt results were available to call my office.

## 2017-03-26 NOTE — Telephone Encounter (Signed)
Left message informing pt I had left several messages and mailed a note to call for results.

## 2017-03-26 NOTE — Telephone Encounter (Signed)
Pt called and I informed of Dr. Gabriel Rung review of results and addendum and that he should make an appt with PCP and if calf pain, swelling worsened to go to the ED. Pt states understanding.

## 2017-03-26 NOTE — Telephone Encounter (Deleted)
-----   Message from Vivi Barrack, DPM sent at 03/24/2017  4:44 PM EST ----- Val please let him know they did an addendum to show he does have a superficial clot but it is not in a deep vein. Follow-up with PCP, continue motrin as needed but likely no need for anticoagulation. If he develops any calf pain, swelling increase return to the ER.

## 2017-03-26 NOTE — Telephone Encounter (Signed)
Pt returned call concerning results.

## 2017-04-02 ENCOUNTER — Ambulatory Visit: Payer: Self-pay | Admitting: Podiatry

## 2017-04-14 ENCOUNTER — Encounter: Payer: Self-pay | Admitting: Podiatry

## 2017-04-14 ENCOUNTER — Telehealth: Payer: Self-pay | Admitting: *Deleted

## 2017-04-14 NOTE — Telephone Encounter (Signed)
[facility] [facility] states the wound culture is final and the results will be on the fax machine in less than 10 minutes.

## 2017-04-14 NOTE — Telephone Encounter (Signed)
-----   Message from Vivi Barrack, DPM sent at 04/13/2017  5:16 PM EST ----- Val- can you call to see if the culture is complete? It has been so long and have not yet seen a final result.

## 2017-04-19 ENCOUNTER — Ambulatory Visit: Payer: Self-pay | Admitting: Podiatry

## 2017-04-22 ENCOUNTER — Telehealth: Payer: Self-pay | Admitting: *Deleted

## 2017-04-22 NOTE — Telephone Encounter (Signed)
-----   Message from Vivi Barrack, DPM sent at 04/19/2017  7:43 PM EST ----- He came in for a wound and he has cancelled his appointments and he has not come back in. Can you please try to call him to see how he is doing? If no answer we need to mail a letter. Thanks.

## 2017-04-22 NOTE — Telephone Encounter (Signed)
I spoke to Micheal Bell and he stated the left foot wound seemed to be healing, and in the process his grandfather fell and broke his pelvis and they are in the process of getting him in to a nursing home, but he is resting the area. I told Micheal Bell that it was important to let Dr.Wagoner evaluated the wound, to make sure it had not healed over infection, and that I would transfer to schedulers so he could schedule for his convenience.

## 2017-05-12 ENCOUNTER — Other Ambulatory Visit: Payer: Self-pay

## 2017-05-12 ENCOUNTER — Emergency Department (HOSPITAL_BASED_OUTPATIENT_CLINIC_OR_DEPARTMENT_OTHER)
Admission: EM | Admit: 2017-05-12 | Discharge: 2017-05-12 | Disposition: A | Discharge status: Home / Self Care | Payer: Medicaid Other | Attending: Emergency Medicine | Admitting: Emergency Medicine

## 2017-05-12 ENCOUNTER — Emergency Department (HOSPITAL_BASED_OUTPATIENT_CLINIC_OR_DEPARTMENT_OTHER): Payer: Medicaid Other

## 2017-05-12 ENCOUNTER — Encounter (HOSPITAL_BASED_OUTPATIENT_CLINIC_OR_DEPARTMENT_OTHER): Payer: Self-pay | Admitting: Emergency Medicine

## 2017-05-12 DIAGNOSIS — N50811 Right testicular pain: Secondary | ICD-10-CM | POA: Insufficient documentation

## 2017-05-12 DIAGNOSIS — R350 Frequency of micturition: Secondary | ICD-10-CM | POA: Insufficient documentation

## 2017-05-12 DIAGNOSIS — Z79899 Other long term (current) drug therapy: Secondary | ICD-10-CM | POA: Insufficient documentation

## 2017-05-12 DIAGNOSIS — J45909 Unspecified asthma, uncomplicated: Secondary | ICD-10-CM | POA: Insufficient documentation

## 2017-05-12 DIAGNOSIS — I110 Hypertensive heart disease with heart failure: Secondary | ICD-10-CM | POA: Insufficient documentation

## 2017-05-12 DIAGNOSIS — R1031 Right lower quadrant pain: Secondary | ICD-10-CM | POA: Insufficient documentation

## 2017-05-12 DIAGNOSIS — R3915 Urgency of urination: Secondary | ICD-10-CM | POA: Insufficient documentation

## 2017-05-12 DIAGNOSIS — I509 Heart failure, unspecified: Secondary | ICD-10-CM | POA: Insufficient documentation

## 2017-05-12 DIAGNOSIS — R109 Unspecified abdominal pain: Secondary | ICD-10-CM | POA: Insufficient documentation

## 2017-05-12 DIAGNOSIS — E119 Type 2 diabetes mellitus without complications: Secondary | ICD-10-CM | POA: Insufficient documentation

## 2017-05-12 DIAGNOSIS — Z7984 Long term (current) use of oral hypoglycemic drugs: Secondary | ICD-10-CM | POA: Insufficient documentation

## 2017-05-12 DIAGNOSIS — R300 Dysuria: Secondary | ICD-10-CM | POA: Insufficient documentation

## 2017-05-12 LAB — CBC WITH DIFFERENTIAL/PLATELET
BASOS PCT: 0 %
Basophils Absolute: 0 10*3/uL (ref 0.0–0.1)
EOS PCT: 0 %
Eosinophils Absolute: 0 10*3/uL (ref 0.0–0.7)
HCT: 37.8 % — ABNORMAL LOW (ref 39.0–52.0)
Hemoglobin: 13 g/dL (ref 13.0–17.0)
LYMPHS ABS: 1.7 10*3/uL (ref 0.7–4.0)
Lymphocytes Relative: 14 %
MCH: 32.2 pg (ref 26.0–34.0)
MCHC: 34.4 g/dL (ref 30.0–36.0)
MCV: 93.6 fL (ref 78.0–100.0)
MONO ABS: 0.5 10*3/uL (ref 0.1–1.0)
Monocytes Relative: 4 %
NEUTROS ABS: 9.6 10*3/uL — AB (ref 1.7–7.7)
Neutrophils Relative %: 82 %
PLATELETS: 262 10*3/uL (ref 150–400)
RBC: 4.04 MIL/uL — ABNORMAL LOW (ref 4.22–5.81)
RDW: 12.7 % (ref 11.5–15.5)
WBC: 11.8 10*3/uL — ABNORMAL HIGH (ref 4.0–10.5)

## 2017-05-12 LAB — URINALYSIS, ROUTINE W REFLEX MICROSCOPIC
BILIRUBIN URINE: NEGATIVE
Glucose, UA: >=500 mg/dL — AB
Hgb urine dipstick: NEGATIVE
KETONES UR: 15 mg/dL — AB
LEUKOCYTES UA: NEGATIVE
NITRITE: NEGATIVE
Protein, ur: NEGATIVE mg/dL
SPECIFIC GRAVITY, URINE: 1.015 (ref 1.005–1.030)
pH: 6 (ref 5.0–8.0)

## 2017-05-12 LAB — BASIC METABOLIC PANEL
Anion gap: 14 (ref 5–15)
BUN: 17 mg/dL (ref 6–20)
CHLORIDE: 95 mmol/L — AB (ref 101–111)
CO2: 25 mmol/L (ref 22–32)
Calcium: 9.3 mg/dL (ref 8.9–10.3)
Creatinine, Ser: 1.13 mg/dL (ref 0.61–1.24)
GFR calc Af Amer: >60 mL/min (ref >60)
GFR calc non Af Amer: >60 mL/min (ref >60)
Glucose, Bld: 371 mg/dL — ABNORMAL HIGH (ref 65–99)
Potassium: 4.1 mmol/L (ref 3.5–5.1)
Sodium: 134 mmol/L — ABNORMAL LOW (ref 135–145)

## 2017-05-12 LAB — URINALYSIS, MICROSCOPIC (REFLEX): WBC, UA: NONE SEEN WBC/hpf (ref 0–5)

## 2017-05-12 IMAGING — CT CT ABD-PELV W/ CM
2 of 5 series · 16 of 46 positions shown, 18 images · IV contrast (APPLIED)
Comparison: None.

CLINICAL DATA: Right-sided abdominal pain radiating to the hip and
groin

EXAM:
CT ABDOMEN AND PELVIS WITH CONTRAST
TECHNIQUE: Multidetector CT imaging of the abdomen and pelvis was performed
using the standard protocol following bolus administration of
intravenous contrast.
CONTRAST:  100mL [KM] IOPAMIDOL ([KM]) INJECTION 61%

[Series 2: axial st · axial · 0.95mm/px · z∈[-516,-61]mm · 13 of 103 slices shown, 15 images]
[im 6/103  soft-tissue]
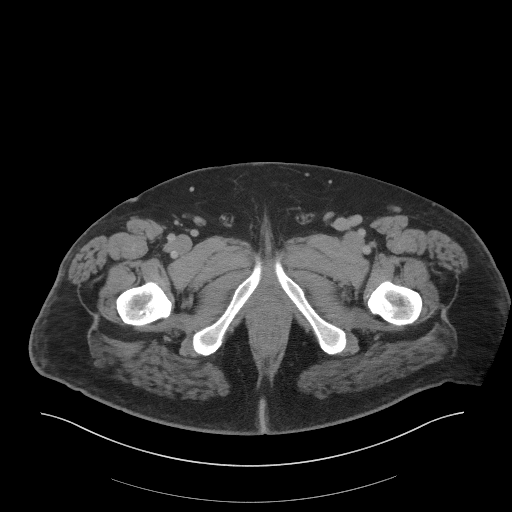
[im 6/103  bone]
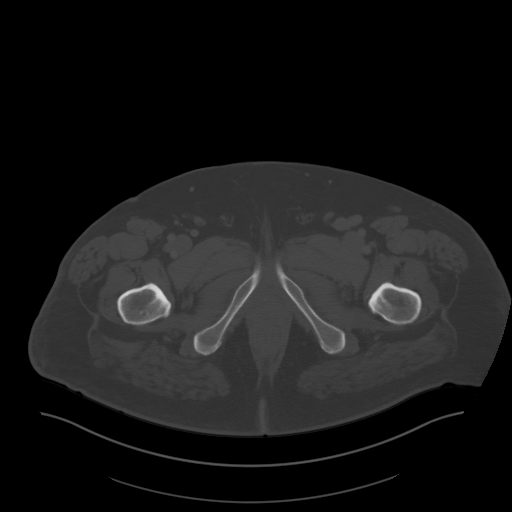
[im 12/103  soft-tissue]
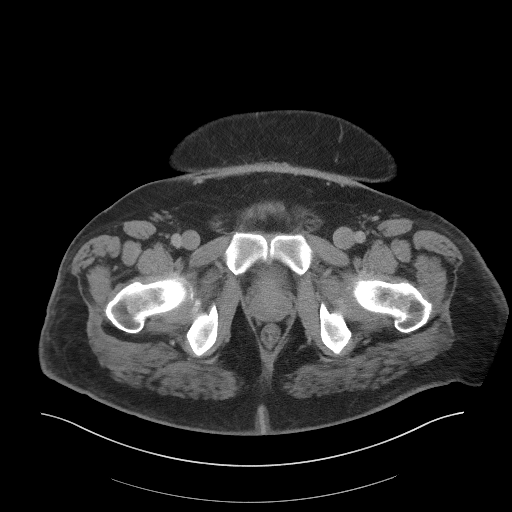
[im 23/103  soft-tissue]
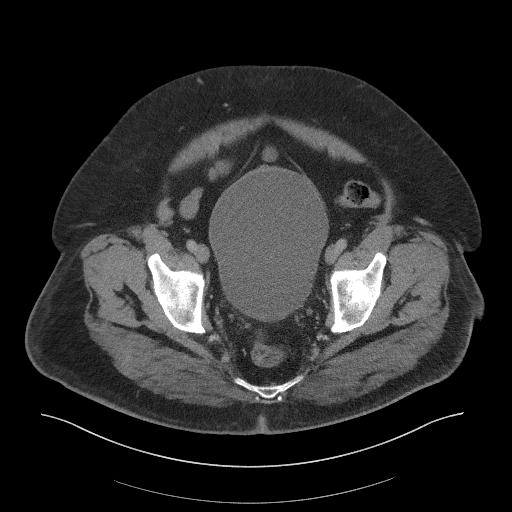
[im 29/103  soft-tissue]
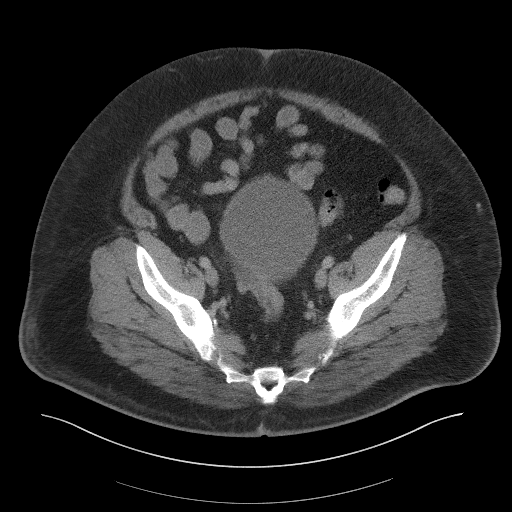
[im 35/103  soft-tissue]
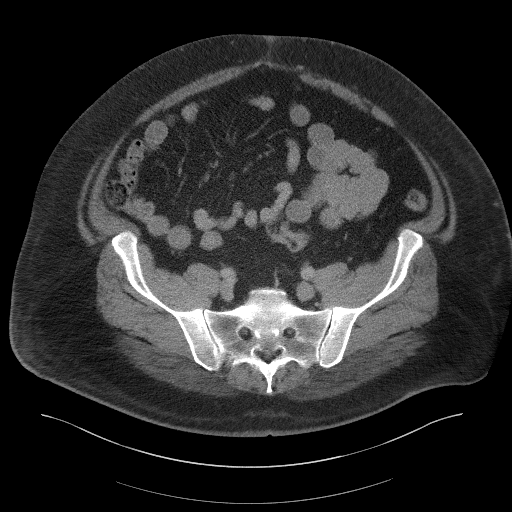
[im 46/103  soft-tissue]
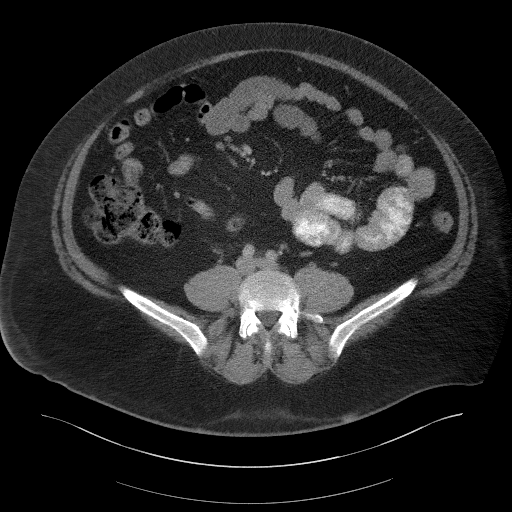
[im 52/103  soft-tissue]
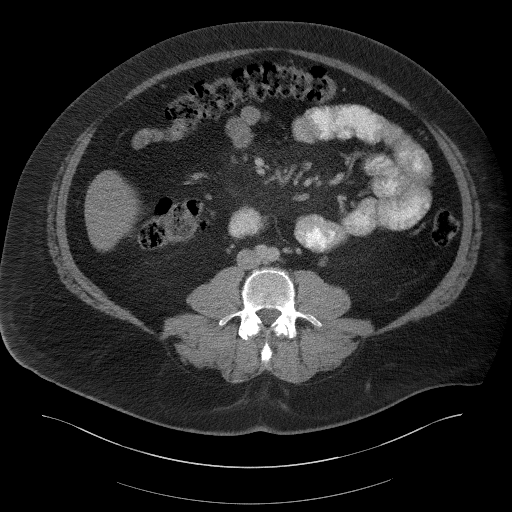
[im 57/103  soft-tissue]
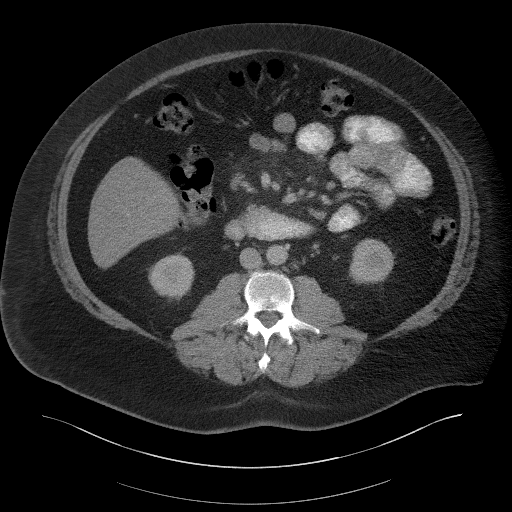
[im 69/103  soft-tissue]
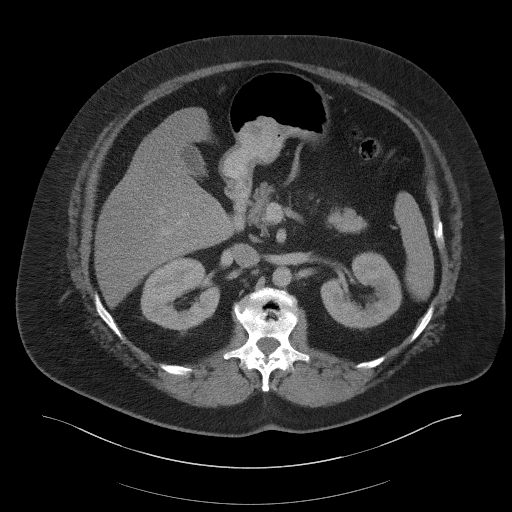
[im 69/103  bone]
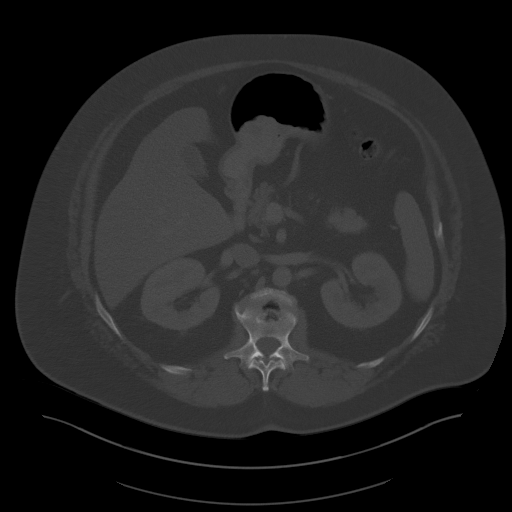
[im 74/103  soft-tissue]
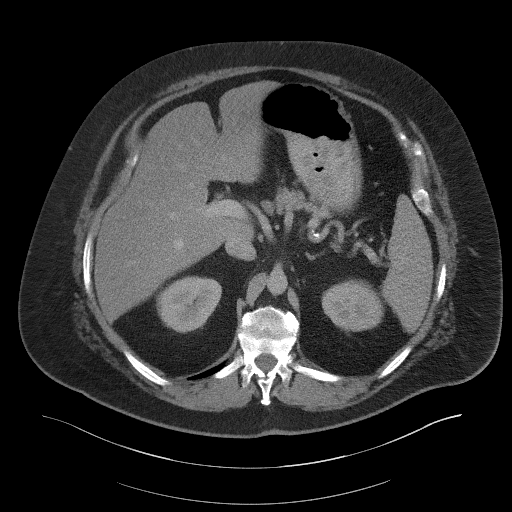
[im 80/103  soft-tissue]
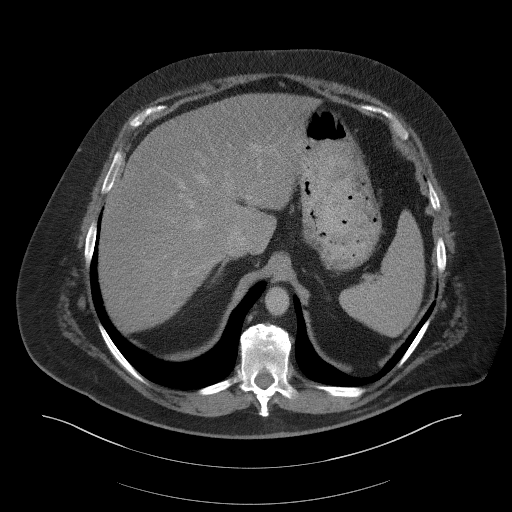
[im 91/103  soft-tissue]
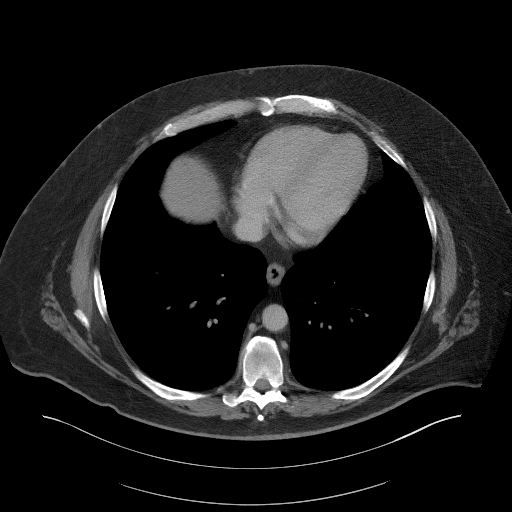
[im 97/103  soft-tissue]
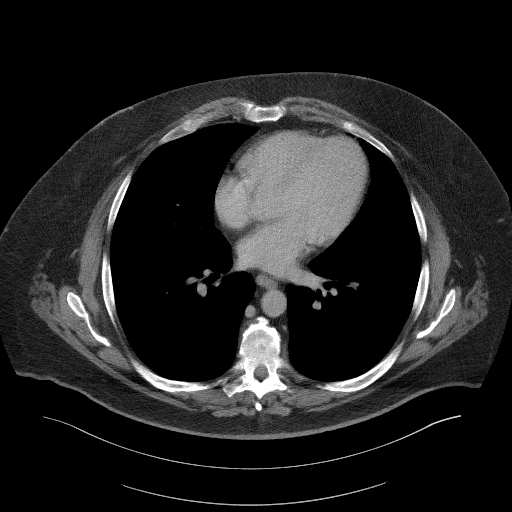

[Series 5: coronal st · coronal · 0.86mm/px · 3 of 122 slices shown]
[im 41/122  soft-tissue]
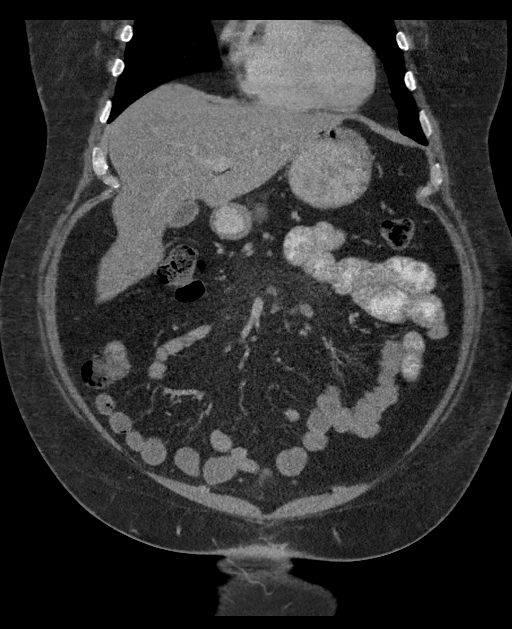
[im 54/122  soft-tissue]
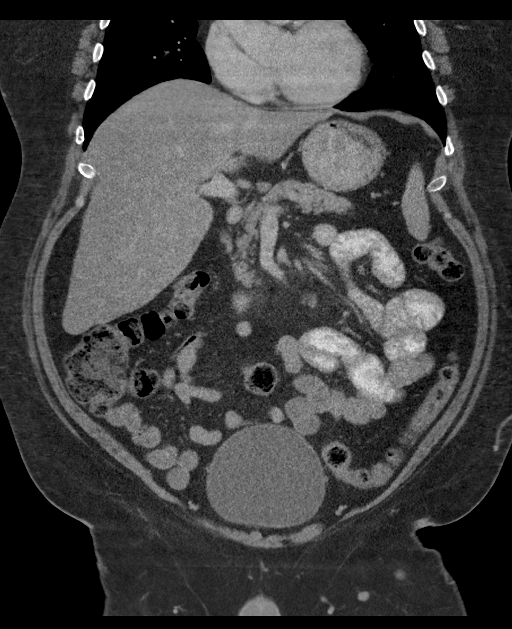
[im 68/122  soft-tissue]
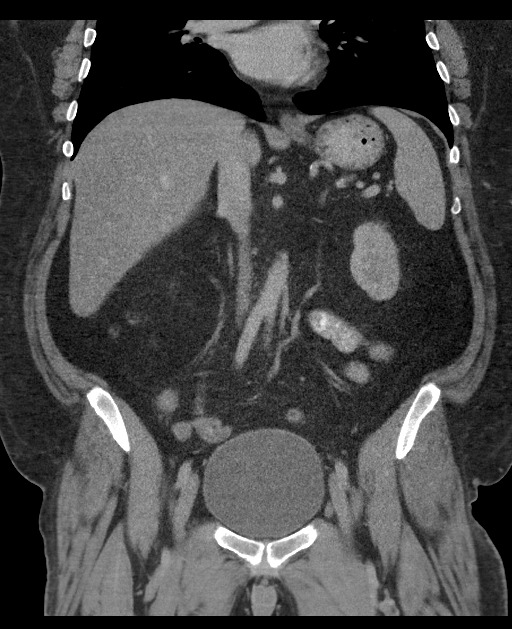

[16 of 46 positions shown; findings below may reference images not displayed]

FINDINGS: Lower chest: Lung bases demonstrate no acute consolidation or
effusion. The heart size is within normal limits.

Hepatobiliary: Hepatic steatosis. No calcified gallstones or biliary
dilatation

Pancreas: Unremarkable. No pancreatic ductal dilatation or
surrounding inflammatory changes.

Spleen: Normal in size without focal abnormality.

Adrenals/Urinary Tract: Adrenal glands are unremarkable. Kidneys are
normal, without renal calculi, focal lesion, or hydronephrosis.
Bladder is unremarkable.

Stomach/Bowel: Stomach is within normal limits. Appendix appears
normal. No evidence of bowel wall thickening, distention, or
inflammatory changes.

Vascular/Lymphatic: No significant vascular findings are present. No
enlarged abdominal or pelvic lymph nodes.

Reproductive: Prostate is unremarkable.

Other: Minimal haziness at the root of the mesentery. No free air or
free fluid. Small fat in the umbilical region.

Musculoskeletal: Kyphosis of the lower thoracic spine with partial
fusion, contiguous wedge deformities and anterior flowing
osteophytes. No acute abnormality.
IMPRESSION: 1. No CT evidence for acute intra-abdominal or pelvic abnormality.
2. Hepatic steatosis.

## 2017-05-12 MED ORDER — MORPHINE SULFATE (PF) 4 MG/ML IV SOLN
4.0000 mg | Freq: Once | INTRAVENOUS | Status: AC
  Administered 2017-05-12: 4 mg via INTRAVENOUS
  Filled 2017-05-12: qty 1

## 2017-05-12 MED ORDER — ONDANSETRON HCL 4 MG/2ML IJ SOLN
4.0000 mg | Freq: Once | INTRAMUSCULAR | Status: AC
  Administered 2017-05-12: 4 mg via INTRAVENOUS
  Filled 2017-05-12: qty 2

## 2017-05-12 MED ORDER — SULFAMETHOXAZOLE-TRIMETHOPRIM 800-160 MG PO TABS: 1.0000 | ORAL_TABLET | Freq: Two times a day (BID) | ORAL | 0 refills | Status: DC

## 2017-05-12 MED ORDER — IOPAMIDOL (ISOVUE-300) INJECTION 61%
100.0000 mL | Freq: Once | INTRAVENOUS | Status: AC | PRN
  Administered 2017-05-12: 100 mL via INTRAVENOUS

## 2017-05-12 MED ORDER — LIDOCAINE 5 % EX PTCH
1.0000 | MEDICATED_PATCH | CUTANEOUS | 0 refills | Status: DC
Start: 2017-05-12 — End: 2020-02-06

## 2017-05-12 NOTE — ED Provider Notes (Signed)
[facility] EMERGENCY DEPARTMENT Provider Note   CSN: [id number] Arrival date & time: 05/12/17  1615     History   Chief Complaint Chief Complaint  Patient presents with  . Flank Pain    HPI Micheal Bell is a 47 y.o. male.  HPI 47 year old Caucasian male past medical history significant for CHF, asthma, diabetes, fibromyalgia on chronic pain medication including hydrocodone, hypertension and irritable bowel syndrome presents to the emergency department today for evaluation of right flank pain, right testicular pain, dysuria.  Patient states that the right flank pain radiates to his right hip and into his right groin at times.  States that walking and prolonged sitting makes the pain worse.  States that when he lays on his left side the pain is improved.  Patient also reports some right testicular pain.  Denies any associated swelling.  Denies any associated penile discharge.  Does report some burning with associated with urgency and frequency.  Patient is concerned he may have a kidney stone with no history of.  Patient states he is on chronic pain medication including hydrocodone at home that is not helping his pain.  Denies any recent injury.  Patient states that he is not sexually active.  Denies any associated fevers, chills, nausea or vomiting.  Does report some right lower quadrant abdominal pain at times.  Denies any associated chest pain or shortness of breath.  Patient has been taking his pain medication along with Flexeril at home without any relief of his symptoms.  Does report chronic diarrhea with history of hemorrhoids in bleeding at times.  Denies any new symptoms of change in his bowel habits or melena or hematochezia. Pt denies any ha, night sweats, hx of ivdu/cancer, loss or bowel or bladder, urinary retention, saddle paresthesias, lower extremity paresthesias.    Pt denies any fever, chill, ha, vision changes, lightheadedness, dizziness, congestion, neck pain,  cp, sob, cough,  n/v/d, urinary symptoms, change in bowel habits,  lower extremity paresthesias.  Past Medical History:  Diagnosis Date  . Arthritis   . Asthma   . CHF (congestive heart failure) (HCC)   . Diabetes mellitus without complication (HCC)   . Fibromyalgia   . Gout   . Hypertension   . IBS (irritable bowel syndrome)   . Vein disorder     Patient Active Problem List   Diagnosis Date Noted  . Benign hypertension 07/23/2015  . Gastro-esophageal reflux disease with esophagitis 07/23/2015  . Gout 07/23/2015  . Obstructive sleep apnea 07/23/2015  . Snoring 07/23/2015  . Chronic idiopathic gout of multiple sites 06/02/2015  . Class 2 obesity in adult 06/02/2015  . Fibromyalgia 06/02/2015  . Irritable bowel syndrome with diarrhea 06/02/2015  . Mild persistent asthma 06/02/2015  . Reaction, adjustment, with depressed mood, prolonged 06/02/2015  . Type 2 diabetes mellitus without complication, without long-term current use of insulin (HCC) 06/02/2015    History reviewed. No pertinent surgical history.     Home Medications    Prior to Admission medications   Medication Sig Start Date End Date Taking? Authorizing Provider  albuterol (PROVENTIL HFA;VENTOLIN HFA) 108 (90 Base) MCG/ACT inhaler Inhale into the lungs every 6 (six) hours as needed for wheezing or shortness of breath.    [provider]  allopurinol (ZYLOPRIM) 300 MG tablet Take 300 mg by mouth daily.    [provider]  amoxicillin-clavulanate (AUGMENTIN) 875-125 MG tablet Take 1 tablet by mouth 2 (two) times daily. 03/19/17   Vivi Barrack,  DPM  chlorthalidone (HYGROTON) 25 MG tablet Take 25 mg by mouth daily.    [provider]  ciprofloxacin (CIPRO) 500 MG tablet Take 500 mg by mouth 2 (two) times daily.    [provider]  citalopram (CELEXA) 40 MG tablet Take 40 mg by mouth daily.    [provider]  cyclobenzaprine (FLEXERIL) 10 MG tablet Take 10 mg by mouth  3 (three) times daily as needed for muscle spasms.    [provider]  Fluticasone Propionate, Inhal, (FLOVENT IN) Inhale into the lungs.    [provider]  furosemide (LASIX) 20 MG tablet Take 20 mg by mouth.    [provider]  glipiZIDE (GLUCOTROL) 10 MG tablet Take 10 mg by mouth daily before breakfast.    [provider]  HYDROcodone-acetaminophen (NORCO/VICODIN) 5-325 MG tablet Take 1 tablet by mouth every 6 (six) hours as needed for moderate pain.    [provider]  lidocaine (LIDODERM) 5 % Place 1 patch onto the skin daily. Remove & Discard patch within 12 hours or as directed by MD 05/12/17   Rise Mu, PA-C  meloxicam (MOBIC) 15 MG tablet Take 15 mg by mouth daily.    [provider]  metFORMIN (GLUCOPHAGE) 1000 MG tablet Take 1 tablet (1,000 mg total) by mouth 2 (two) times daily. 03/16/17   Rolland Porter, MD  mometasone-formoterol [facility]) 100-5 MCG/ACT AERO Inhale 2 puffs into the lungs 2 (two) times daily.    [provider]  montelukast (SINGULAIR) 10 MG tablet Take 10 mg by mouth at bedtime.    [provider]  nitroGLYCERIN (NITRODUR - DOSED IN MG/24 HR) 0.2 mg/hr patch Place 0.2 mg onto the skin daily.    [provider]  nortriptyline (PAMELOR) 25 MG capsule Take 25 mg by mouth at bedtime.    [provider]  pantoprazole (PROTONIX) 40 MG tablet Take 40 mg by mouth daily.    [provider]  predniSONE (DELTASONE) 10 MG tablet Take 10 mg by mouth daily with breakfast.    [provider]  sulfamethoxazole-trimethoprim (BACTRIM DS,SEPTRA DS) 800-160 MG tablet Take 1 tablet by mouth 2 (two) times daily. 05/12/17   Rise Mu, PA-C  traMADol (ULTRAM) 50 MG tablet Take 1 tablet (50 mg total) by mouth every 6 (six) hours as needed. 03/16/17   Rolland Porter, MD  verapamil (CALAN-SR) 240 MG CR tablet Take 240 mg by mouth at bedtime.    [provider]  zolpidem  (AMBIEN) 10 MG tablet Take 10 mg by mouth at bedtime as needed for sleep.    [provider]    Family History History reviewed. No pertinent family history.  Social History Social History   Tobacco Use  . Smoking status: Unknown If Ever Smoked  . Smokeless tobacco: Never Used  Substance Use Topics  . Alcohol use: Not on file  . Drug use: Not on file     Allergies   Azithromycin and Lodine [etodolac]   Review of Systems Review of Systems  All other systems reviewed and are negative.    Physical Exam Updated Vital Signs BP 132/82 (BP Location: Left Arm)   Pulse 85   Temp 98.8 F (37.1 C) (Oral)   Resp 18   Ht 6\' 2"  (1.88 m)   Wt (!) 145.2 kg (320 lb)   SpO2 97%   BMI 41.09 kg/m   Physical Exam  Constitutional: He is oriented to person, place, and time.  He appears well-developed and well-nourished.  Non-toxic appearance. No distress.  HENT:  Head: Normocephalic and atraumatic.  Mouth/Throat: Oropharynx is clear and moist.  Eyes: Conjunctivae are normal. Pupils are equal, round, and reactive to light. Right eye exhibits no discharge. Left eye exhibits no discharge.  Neck: Normal range of motion. Neck supple.  Cardiovascular: Normal rate, regular rhythm, normal heart sounds and intact distal pulses. Exam reveals no gallop and no friction rub.  No murmur heard. Pulmonary/Chest: Effort normal and breath sounds normal. No stridor. No respiratory distress. He has no wheezes. He has no rales. He exhibits no tenderness.  Abdominal: Soft. Bowel sounds are normal. He exhibits no distension. There is no tenderness. There is CVA tenderness (right). There is no rigidity, no rebound, no guarding, no tenderness at McBurney's point and negative Murphy's sign.  Obese abdomen.  Genitourinary:  Genitourinary Comments: Chaperone present for exam. Circumcised male. No penile discharge, erythema, tenderness, lesion, or rash. 2 descended testes without swelling,, lesions or  rash.  Patient has mild pain to the right testicle with palpation.  No inguinal lymphadenopathy or hernia.    Musculoskeletal: Normal range of motion. He exhibits no tenderness.  No midline T spine or L spine tenderness. No deformities or step offs noted. Full ROM. Pelvis is stable. Patient does have mild tenderness palpation in the right lumbar paraspinal region that radiates to the right buttocks.  Negative straight leg raise test.  Skin compartments are soft.  DP pulses are 2+ bilaterally.  Sensation intact.  Brisk cap refill.  Normal reflexes of lower extremities.  Normal strength.  Lymphadenopathy:    He has no cervical adenopathy.  Neurological: He is alert and oriented to person, place, and time.  Skin: Skin is warm and dry. Capillary refill takes less than 2 seconds. No rash noted.  Psychiatric: His behavior is normal. Judgment and thought content normal.  Nursing note and vitals reviewed.    ED Treatments / Results  Labs (all labs ordered are listed, but only abnormal results are displayed) Labs Reviewed  URINALYSIS, ROUTINE W REFLEX MICROSCOPIC - Abnormal; Notable for the following components:      Result Value   Glucose, UA >=500 (*)    Ketones, ur 15 (*)    All other components within normal limits  URINALYSIS, MICROSCOPIC (REFLEX) - Abnormal; Notable for the following components:   Bacteria, UA RARE (*)    Squamous Epithelial / LPF 0-5 (*)    All other components within normal limits  BASIC METABOLIC PANEL - Abnormal; Notable for the following components:   Sodium 134 (*)    Chloride 95 (*)    Glucose, Bld 371 (*)    All other components within normal limits  CBC WITH DIFFERENTIAL/PLATELET - Abnormal; Notable for the following components:   WBC 11.8 (*)    RBC 4.04 (*)    HCT 37.8 (*)    Neutro Abs 9.6 (*)    All other components within normal limits    EKG  EKG Interpretation None       Radiology Ct Abdomen Pelvis W Contrast  Result Date:  05/12/2017 CLINICAL DATA:  Right-sided abdominal pain radiating to the hip and groin EXAM: CT ABDOMEN AND PELVIS WITH CONTRAST TECHNIQUE: Multidetector CT imaging of the abdomen and pelvis was performed using the standard protocol following bolus administration of intravenous contrast. CONTRAST:  ISOVUE-300 IOPAMIDOL (ISOVUE-300) INJECTION 61% COMPARISON:  None. FINDINGS: Lower chest: Lung bases demonstrate no acute consolidation or effusion. The heart size is  within normal limits. Hepatobiliary: Hepatic steatosis. No calcified gallstones or biliary dilatation Pancreas: Unremarkable. No pancreatic ductal dilatation or surrounding inflammatory changes. Spleen: Normal in size without focal abnormality. Adrenals/Urinary Tract: Adrenal glands are unremarkable. Kidneys are normal, without renal calculi, focal lesion, or hydronephrosis. Bladder is unremarkable. Stomach/Bowel: Stomach is within normal limits. Appendix appears normal. No evidence of bowel wall thickening, distention, or inflammatory changes. Vascular/Lymphatic: No significant vascular findings are present. No enlarged abdominal or pelvic lymph nodes. Reproductive: Prostate is unremarkable. Other: Minimal haziness at the root of the mesentery. No free air or free fluid. Small fat in the umbilical region. Musculoskeletal: Kyphosis of the lower thoracic spine with partial fusion, contiguous wedge deformities and anterior flowing osteophytes. No acute abnormality. IMPRESSION: 1. No CT evidence for acute intra-abdominal or pelvic abnormality. 2. Hepatic steatosis. Electronically Signed   By: Jasmine Pang M.D.   On: 05/12/2017 18:42   US Scrotum W/doppler  Result Date: 05/12/2017 CLINICAL DATA:  Right scrotal pain for 3 days EXAM: SCROTAL ULTRASOUND DOPPLER ULTRASOUND OF THE TESTICLES TECHNIQUE: Complete ultrasound examination of the testicles, epididymis, and other scrotal structures was performed. Color and spectral Doppler ultrasound were also  utilized to evaluate blood flow to the testicles. COMPARISON:  None. FINDINGS: Right testicle Measurements: 4.1 x 2.4 x 3 cm. No mass or microlithiasis visualized. Left testicle Measurements: 4 x 1.9 x 2.8 cm. No mass or microlithiasis visualized. Right epididymis:  Normal in size and appearance. Left epididymis:  Normal in size and appearance. Hydrocele:  Small bilateral hydroceles, right greater than left Varicocele:  None visualized. Pulsed Doppler interrogation of both testes demonstrates normal low resistance arterial and venous waveforms bilaterally. Slightly increased Doppler flow in the right testicle relative to the left. IMPRESSION: 1. Slightly increased Doppler flow in the right testicle relative the left concerning for mild orchitis. 2. No testicular mass or testicular torsion. 3. Small bilateral hydroceles. Electronically Signed   By: Elige Ko   On: 05/12/2017 19:55    Procedures Procedures (including critical care time)  Medications Ordered in ED Medications  morphine 4 MG/ML injection 4 mg (4 mg Intravenous Given 05/12/17 1727)  ondansetron (ZOFRAN) injection 4 mg (4 mg Intravenous Given 05/12/17 1727)  iopamidol (ISOVUE-300) 61 % injection 100 mL (100 mLs Intravenous Contrast Given 05/12/17 1818)  morphine 4 MG/ML injection 4 mg (4 mg Intravenous Given 05/12/17 2010)     Initial Impression / Assessment and Plan / ED Course  I have reviewed the triage vital signs and the nursing notes.  Pertinent labs & imaging results that were available during my care of the patient were reviewed by me and considered in my medical decision making (see chart for details).     Patient presents to the ED with complaints of right flank, right lower quadrant abdominal pain, right testicular pain with urinary symptoms.  Denies any associated change in bowel habits, fevers, chills, nausea or vomiting.  On exam patient overall well-appearing and nontoxic.  Vital signs are reassuring.  Patient is  afebrile, no tachycardia or hypotension is noted.  Patient has some mild right lower quadrant pain to palpation but no signs of peritonitis.  Mild right CVA tenderness with lumbar tenderness reproducible to palpation.  Negative straight leg raise test.  Neurovascularly intact in all extremities.  Genital exam reveals some mild tenderness of the right testicle but no edema or discharge noted.  Workup is a leukocytosis of 11,000.  Electrolytes are reassuring.  Elevation in glucose with history of diabetes.  Hemoglobin appears the patient's baseline and normal.  Normal liver enzymes.  UA shows no signs of infection.  CT scan of abdomen shows no acute findings.  Hepatic steatosis noted.  No signs of acute appendicitis, nephritis, ureteral stone, bowel obstruction, diverticulitis.  Ultrasound was obtained that shows no sign of testicular torsion but does note possible mild right orchitis.  Patient is older than 25.  Denies being sexually active.  Will treat patient with Bactrim given his CHF history and would like to avoid fluoroquinolones at this time.  Discussed symptomatic treatment with scrotal support and ice at home.  Patient is on chronic pain medication including daily hydrocodone.  He also has Flexeril at home.  I suspect the patient's back pain is related to musculoskeletal pain possibly patient's weight.  Pain is reproducible on palpation.  He has no red flag symptoms that be concerning for cauda equina.  Discussed that he may continue his hydrocodone and Flexeril at home.  Encouraged him to take over-the-counter NSAIDs along with Tylenol and have given him lidocaine patch.  Will need follow-up with his primary care doctor and urologist.   Patient's pain managed in the ED.  Repeat abdominal exam shows no focal signs of tenderness or peritonitis.  Patient is ambulatory.  Discharged in satisfactory condition.  Pt is hemodynamically stable, in NAD, & able to ambulate in the ED. Evaluation does not show  pathology that would require ongoing emergent intervention or inpatient treatment. I explained the diagnosis to the patient. Pain has been managed & has no complaints prior to dc. Pt is comfortable with above plan and is stable for discharge at this time. All questions were answered prior to disposition. Strict return precautions for f/u to the ED were discussed. Encouraged follow up with PCP.    Final Clinical Impressions(s) / ED Diagnoses   Final diagnoses:  Right flank pain  Pain in right testicle  Dysuria    ED Discharge Orders        Ordered    lidocaine (LIDODERM) 5 %  Every 24 hours     05/12/17 2021    sulfamethoxazole-trimethoprim (BACTRIM DS,SEPTRA DS) 800-160 MG tablet  2 times daily     05/12/17 2021       Wallace Keller 05/12/17 2034    Pricilla Loveless, MD 05/12/17 [phone]

## 2017-05-12 NOTE — ED Notes (Signed)
Pt in ultrasound at this time. Family at bedside

## 2017-05-12 NOTE — ED Triage Notes (Signed)
Patient states that he is having right side "kidney pain" that radiates to his hip and groin region  - Movement makes the pain worse - denies any N/V

## 2017-05-12 NOTE — ED Notes (Signed)
Patient transported to CT/Ultrasound

## 2017-05-12 NOTE — Discharge Instructions (Signed)
Your workup has been very reassuring in the ED.  Your CAT scan did not show any significant findings except some fat in your liver which does not explain your symptoms today.  Your blood work has been reassuring.  Your ultrasound did show some signs of inflammation in your testicle.  Will be treated with antibiotics for this.  I would encourage ice compression and elevation with little support as discussed.  In terms of your back pain this may be related to sciatic nerve pain versus musculoskeletal pain.  Would continue your hydrocodone at home.  May also use her muscle relaxers.  I would also recommend using NSAIDs such as Aleve, ibuprofen or Motrin for your pain.  I also given you lidocaine patches use of your back.  Perform low back stretches as discussed.  Follow-up with your primary care doctor concerning her symptoms have also given you follow-up to urology if your symptoms persist. Return to the ED if your symptoms worsen.

## 2017-05-12 NOTE — ED Notes (Signed)
ED Provider at bedside. 

## 2019-09-07 DIAGNOSIS — M79676 Pain in unspecified toe(s): Secondary | ICD-10-CM

## 2019-09-11 ENCOUNTER — Other Ambulatory Visit: Payer: Self-pay

## 2019-09-11 ENCOUNTER — Emergency Department (HOSPITAL_BASED_OUTPATIENT_CLINIC_OR_DEPARTMENT_OTHER): Payer: Self-pay

## 2019-09-11 ENCOUNTER — Encounter (HOSPITAL_BASED_OUTPATIENT_CLINIC_OR_DEPARTMENT_OTHER): Payer: Self-pay

## 2019-09-11 ENCOUNTER — Emergency Department (HOSPITAL_BASED_OUTPATIENT_CLINIC_OR_DEPARTMENT_OTHER)
Admission: EM | Admit: 2019-09-11 | Discharge: 2019-09-11 | Disposition: A | Discharge status: Home / Self Care | Payer: Self-pay | Attending: Emergency Medicine | Admitting: Emergency Medicine

## 2019-09-11 DIAGNOSIS — E119 Type 2 diabetes mellitus without complications: Secondary | ICD-10-CM | POA: Insufficient documentation

## 2019-09-11 DIAGNOSIS — S92342A Displaced fracture of fourth metatarsal bone, left foot, initial encounter for closed fracture: Secondary | ICD-10-CM | POA: Insufficient documentation

## 2019-09-11 DIAGNOSIS — Z7984 Long term (current) use of oral hypoglycemic drugs: Secondary | ICD-10-CM | POA: Insufficient documentation

## 2019-09-11 DIAGNOSIS — S92341A Displaced fracture of fourth metatarsal bone, right foot, initial encounter for closed fracture: Secondary | ICD-10-CM | POA: Diagnosis not present

## 2019-09-11 DIAGNOSIS — S92352A Displaced fracture of fifth metatarsal bone, left foot, initial encounter for closed fracture: Secondary | ICD-10-CM | POA: Diagnosis not present

## 2019-09-11 DIAGNOSIS — Y999 Unspecified external cause status: Secondary | ICD-10-CM | POA: Insufficient documentation

## 2019-09-11 DIAGNOSIS — I509 Heart failure, unspecified: Secondary | ICD-10-CM | POA: Insufficient documentation

## 2019-09-11 DIAGNOSIS — Y939 Activity, unspecified: Secondary | ICD-10-CM | POA: Insufficient documentation

## 2019-09-11 DIAGNOSIS — W1789XA Other fall from one level to another, initial encounter: Secondary | ICD-10-CM | POA: Insufficient documentation

## 2019-09-11 DIAGNOSIS — L03116 Cellulitis of left lower limb: Secondary | ICD-10-CM | POA: Diagnosis not present

## 2019-09-11 DIAGNOSIS — Z79899 Other long term (current) drug therapy: Secondary | ICD-10-CM | POA: Insufficient documentation

## 2019-09-11 DIAGNOSIS — I110 Hypertensive heart disease with heart failure: Secondary | ICD-10-CM | POA: Insufficient documentation

## 2019-09-11 DIAGNOSIS — L03119 Cellulitis of unspecified part of limb: Secondary | ICD-10-CM | POA: Insufficient documentation

## 2019-09-11 DIAGNOSIS — W19XXXA Unspecified fall, initial encounter: Secondary | ICD-10-CM

## 2019-09-11 DIAGNOSIS — J45909 Unspecified asthma, uncomplicated: Secondary | ICD-10-CM | POA: Insufficient documentation

## 2019-09-11 DIAGNOSIS — Z7951 Long term (current) use of inhaled steroids: Secondary | ICD-10-CM | POA: Insufficient documentation

## 2019-09-11 DIAGNOSIS — Y929 Unspecified place or not applicable: Secondary | ICD-10-CM | POA: Insufficient documentation

## 2019-09-11 LAB — COMPREHENSIVE METABOLIC PANEL
ALT: 28 U/L (ref 0–44)
AST: 25 U/L (ref 15–41)
Albumin: 4.2 g/dL (ref 3.5–5.0)
Alkaline Phosphatase: 97 U/L (ref 38–126)
Anion gap: 14 (ref 5–15)
BUN: 19 mg/dL (ref 6–20)
CO2: 31 mmol/L (ref 22–32)
Calcium: 9.2 mg/dL (ref 8.9–10.3)
Chloride: 91 mmol/L — ABNORMAL LOW (ref 98–111)
Creatinine, Ser: 0.9 mg/dL (ref 0.61–1.24)
GFR calc Af Amer: >60 mL/min (ref >60)
GFR calc non Af Amer: >60 mL/min (ref >60)
Glucose, Bld: 408 mg/dL — ABNORMAL HIGH (ref 70–99)
Potassium: 3.8 mmol/L (ref 3.5–5.1)
Sodium: 136 mmol/L (ref 135–145)
Total Bilirubin: 0.6 mg/dL (ref 0.3–1.2)
Total Protein: 7.6 g/dL (ref 6.5–8.1)

## 2019-09-11 LAB — CBC WITH DIFFERENTIAL/PLATELET
Abs Immature Granulocytes: 0.05 10*3/uL (ref 0.00–0.07)
Basophils Absolute: 0 10*3/uL (ref 0.0–0.1)
Basophils Relative: 0 %
Eosinophils Absolute: 0.1 10*3/uL (ref 0.0–0.5)
Eosinophils Relative: 1 %
HCT: 40.5 % (ref 39.0–52.0)
Hemoglobin: 13.9 g/dL (ref 13.0–17.0)
Immature Granulocytes: 0 %
Lymphocytes Relative: 18 %
Lymphs Abs: 2.1 10*3/uL (ref 0.7–4.0)
MCH: 31.5 pg (ref 26.0–34.0)
MCHC: 34.3 g/dL (ref 30.0–36.0)
MCV: 91.8 fL (ref 80.0–100.0)
Monocytes Absolute: 0.4 10*3/uL (ref 0.1–1.0)
Monocytes Relative: 4 %
Neutro Abs: 9.3 10*3/uL — ABNORMAL HIGH (ref 1.7–7.7)
Neutrophils Relative %: 77 %
Platelets: 272 10*3/uL (ref 150–400)
RBC: 4.41 MIL/uL (ref 4.22–5.81)
RDW: 12.2 % (ref 11.5–15.5)
WBC: 12 10*3/uL — ABNORMAL HIGH (ref 4.0–10.5)
nRBC: 0 % (ref 0.0–0.2)

## 2019-09-11 IMAGING — CR DG KNEE COMPLETE 4+V*R*
4 series · 4 of 4 positions shown · non-contrast
Comparison: None.

CLINICAL DATA: Pt arrives with c/o right knee pain and foot pain
after a fall on [REDACTED].

EXAM:
RIGHT KNEE - COMPLETE 4+ VIEW

[t knee ap right]
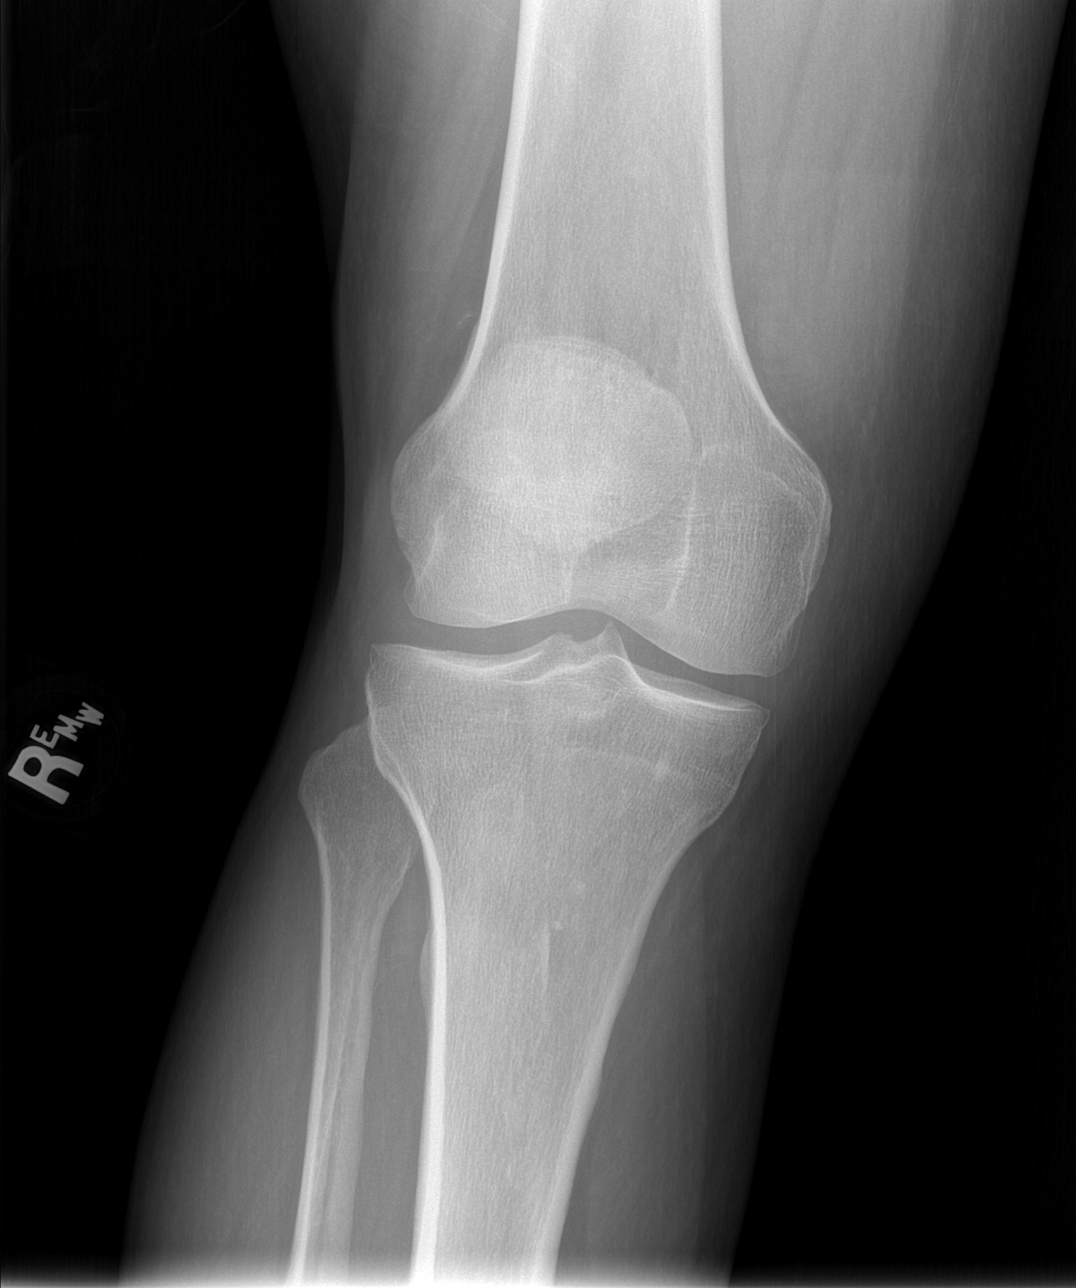

[t knee oblique right (1 of 2)]
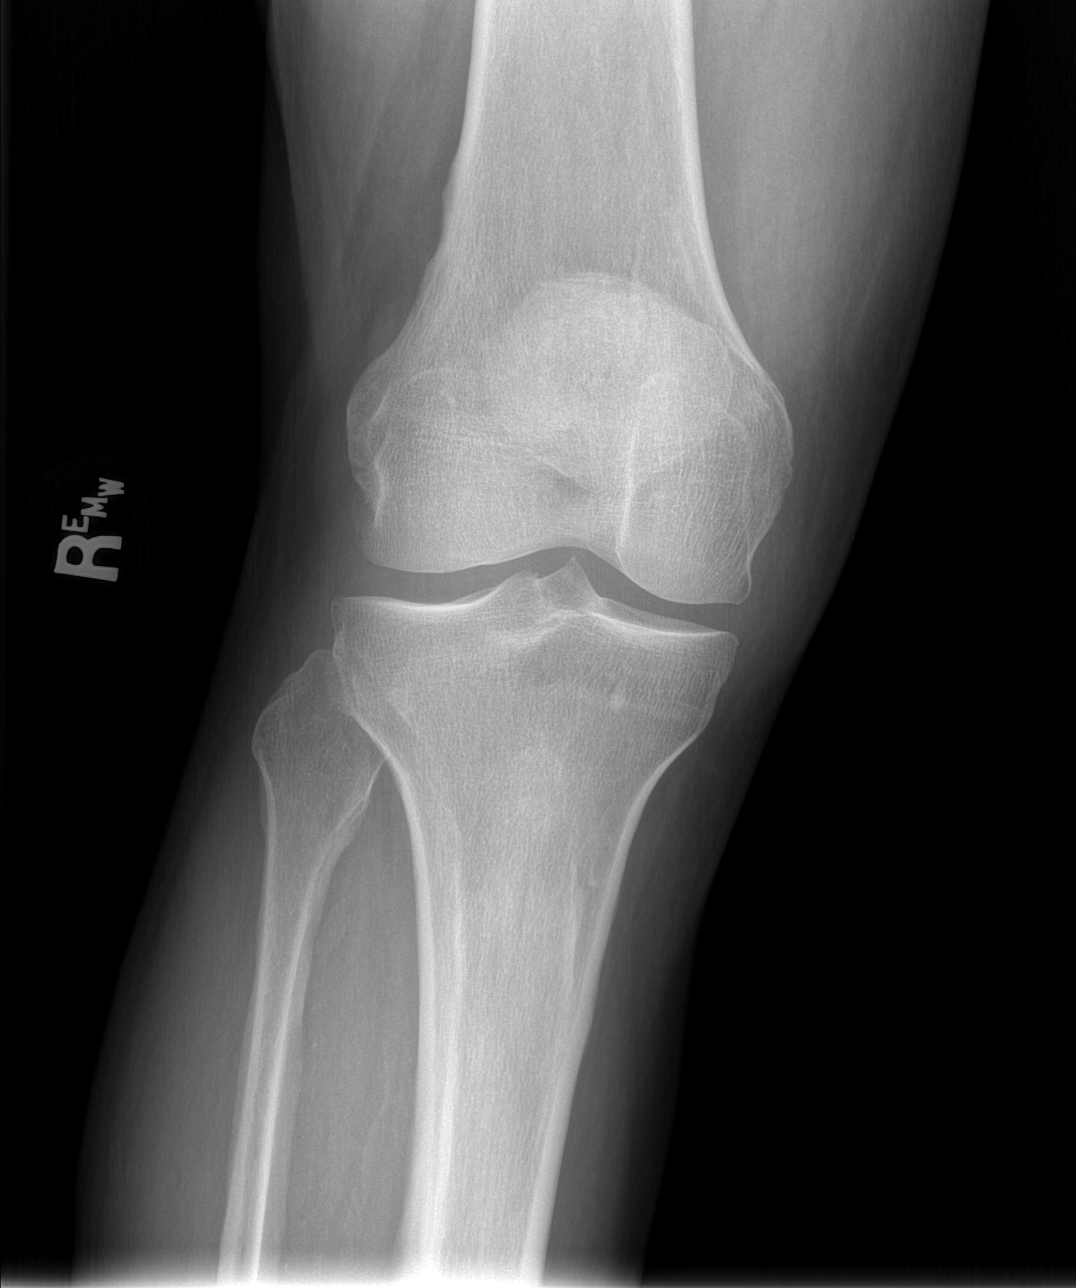

[t knee oblique right (2 of 2)]
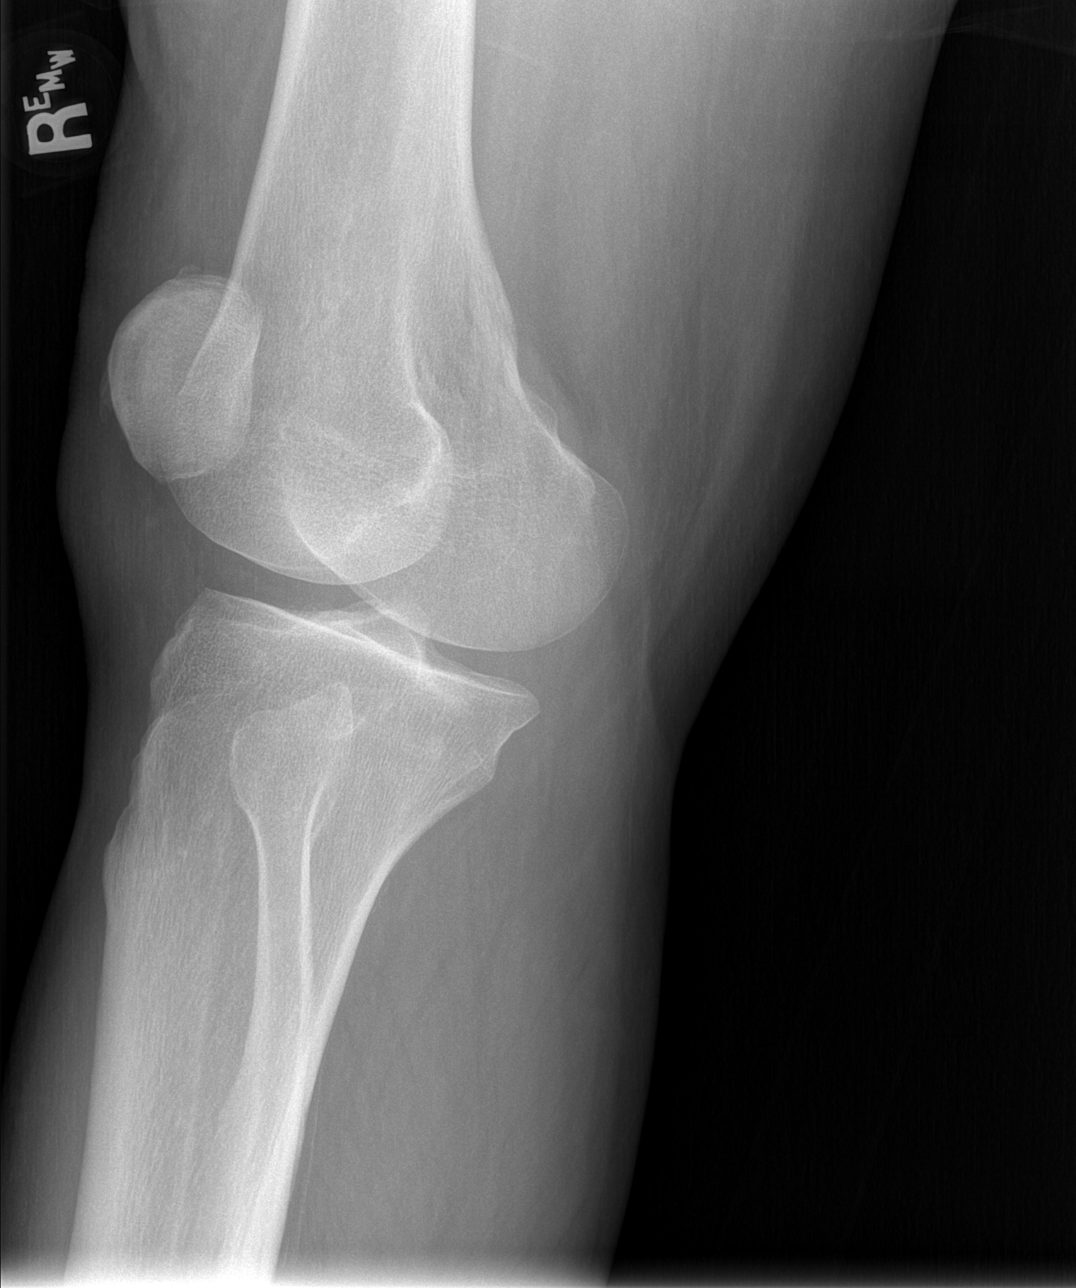

[t knee lat right]
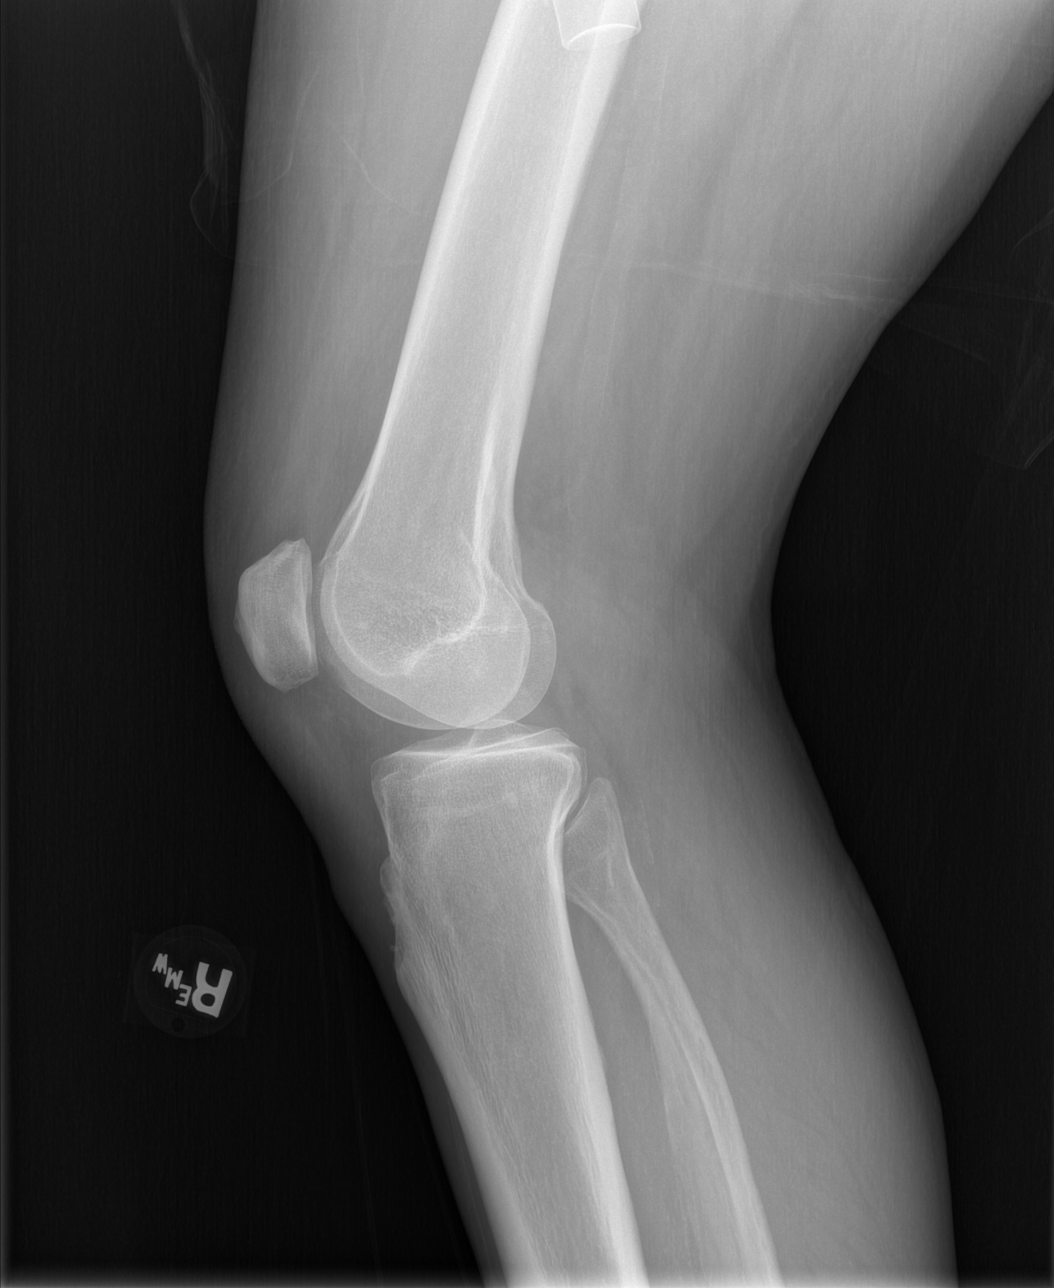

[4 of 4 positions shown; findings below may reference images not displayed]

FINDINGS: No evidence of fracture, dislocation, or joint effusion. No evidence
of arthropathy or other focal bone abnormality. Soft tissues are
unremarkable.

There is nonspecific bony expansion of the proximal fibular shaft,
incompletely visualized.
IMPRESSION: 1. No acute osseous abnormality in the right knee.
2. Nonspecific bony expansion of the proximal fibular shaft,
incompletely visualized. Consider dedicated radiographs of the tibia
and fibula.

## 2019-09-11 IMAGING — CR DG SHOULDER 2+V*R*
3 series · 3 of 3 positions shown · non-contrast
Comparison: None.

CLINICAL DATA: Right shoulder pain after recent fall

EXAM:
RIGHT SHOULDER - 2+ VIEW

[w shoulder grashey right]
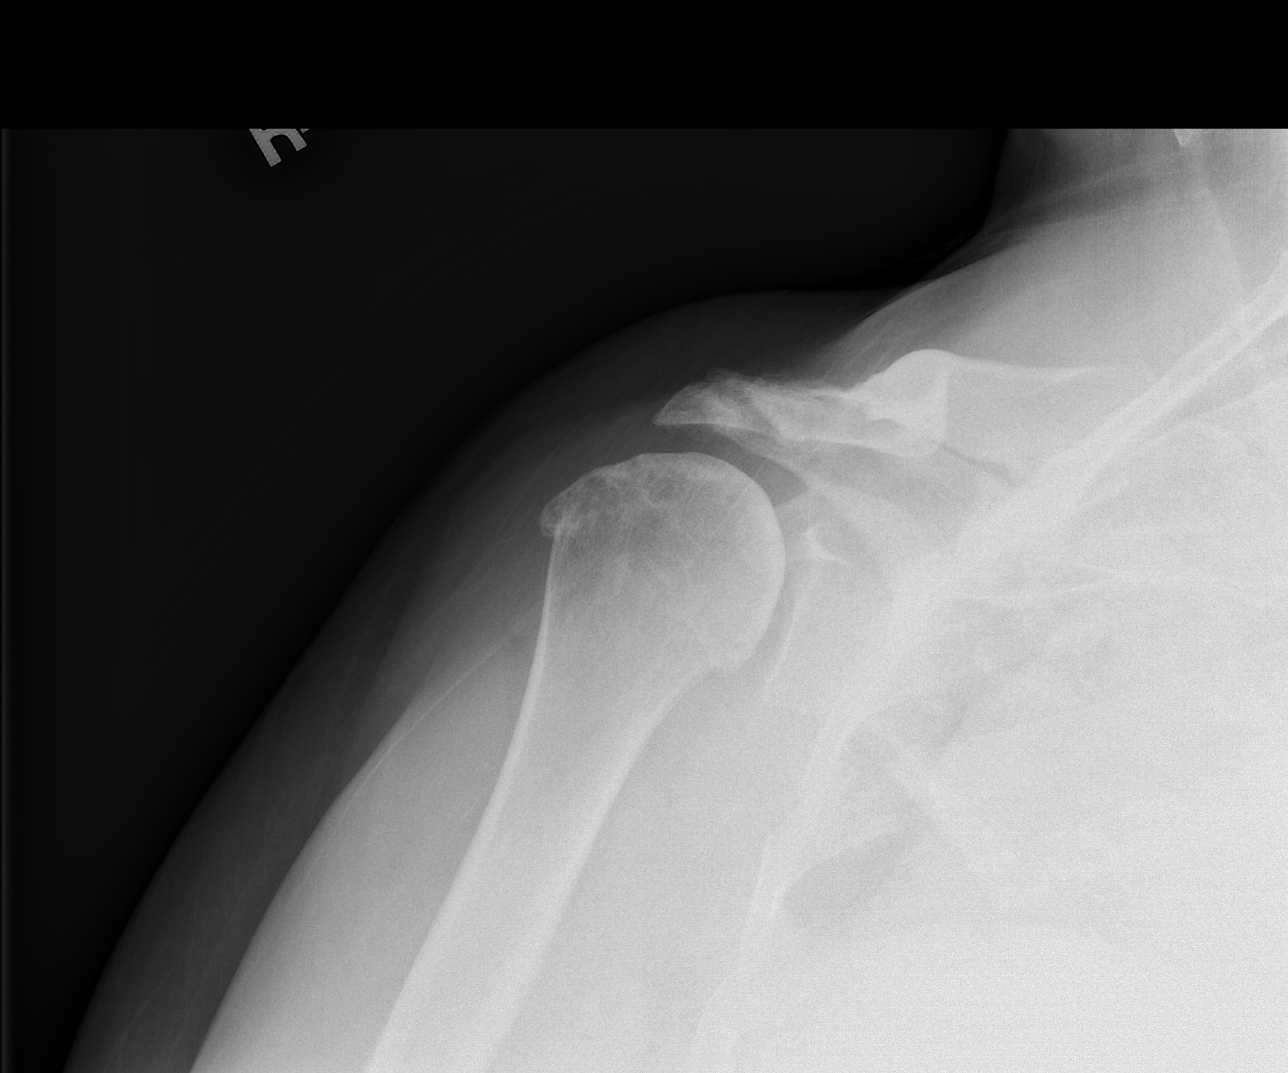

[w shoulder y view right *]
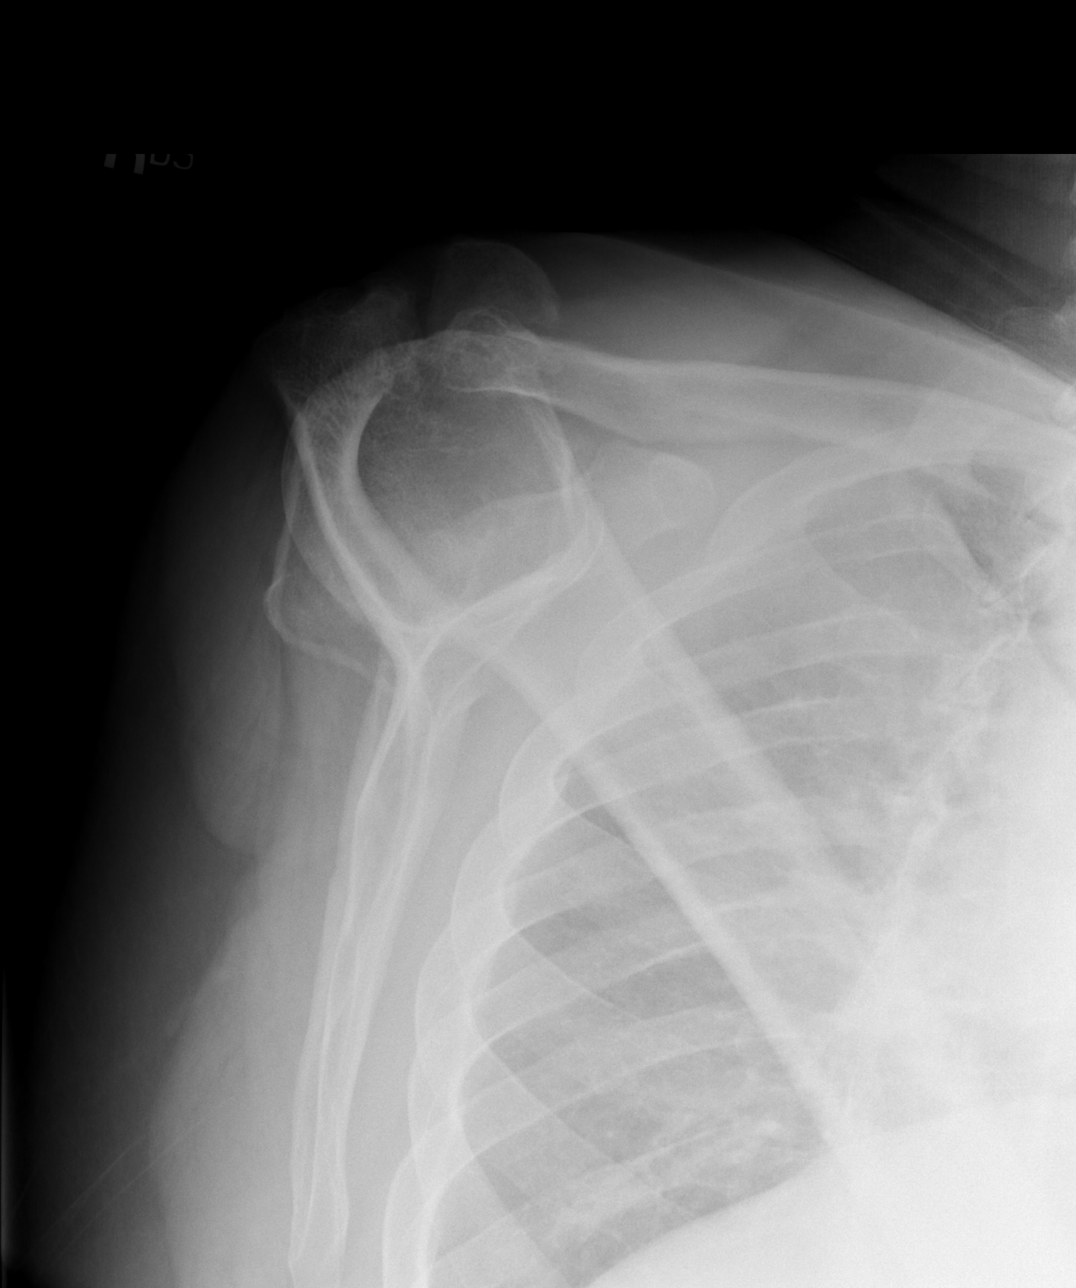

[x shoulder axillary right]
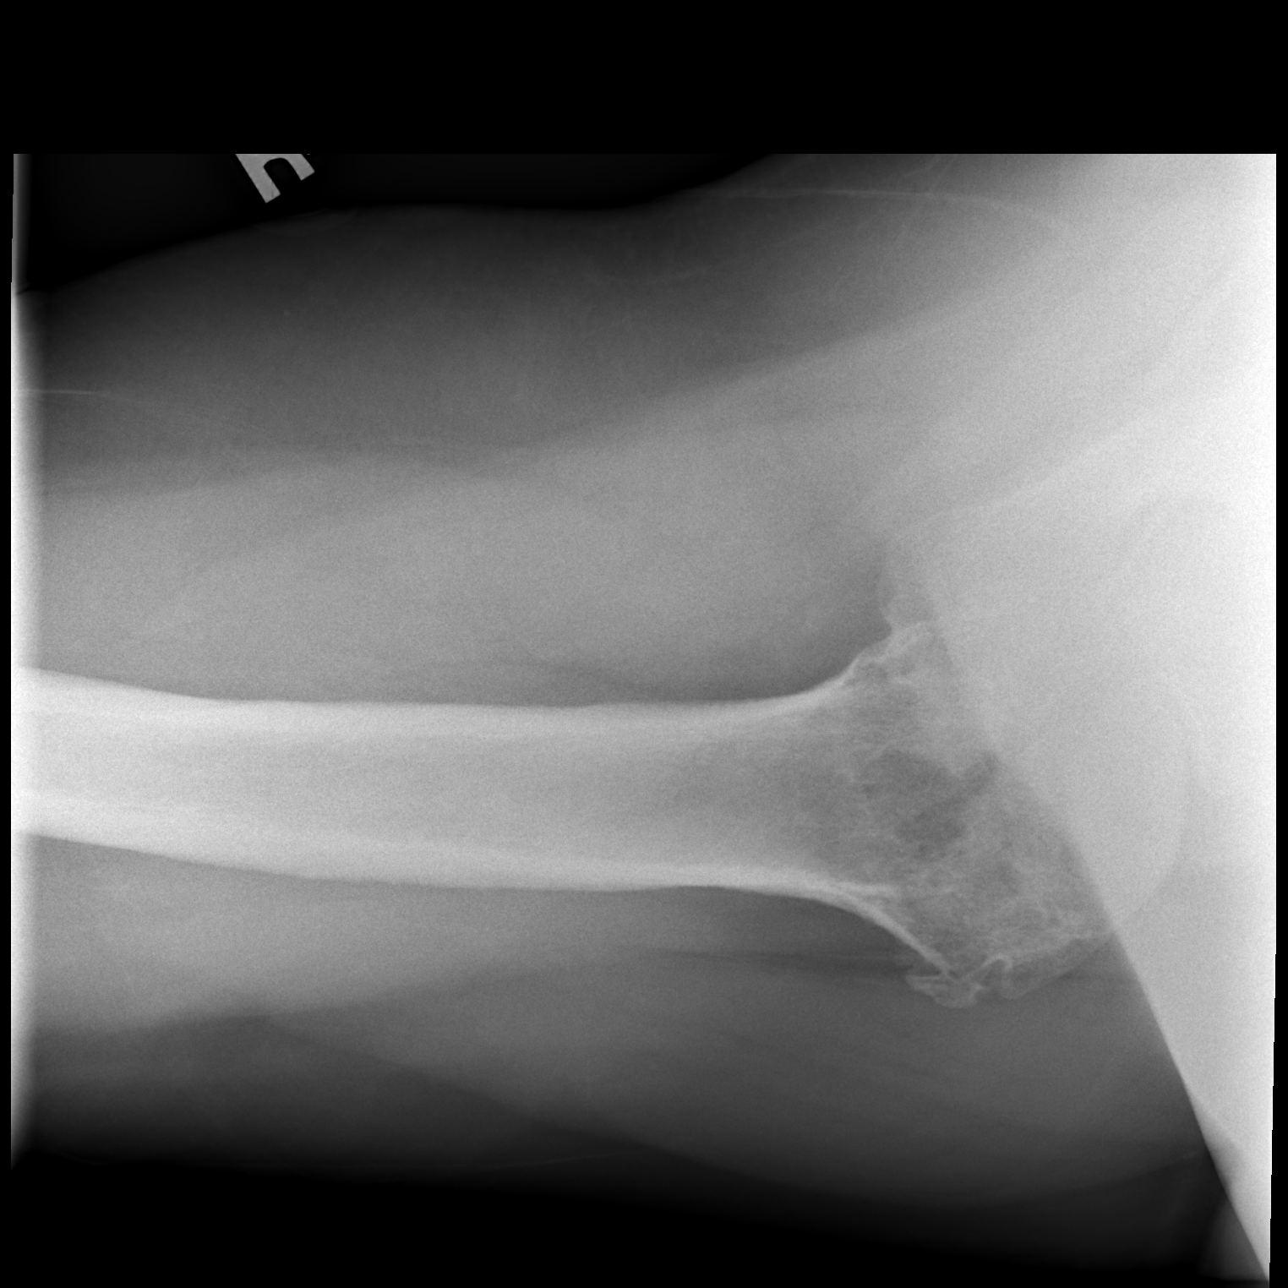

[3 of 3 positions shown; findings below may reference images not displayed]

FINDINGS: There is no evidence of fracture or dislocation. Mild-to-moderate
degenerative changes of the glenohumeral and acromioclavicular
joints. Sclerotic appearance of the superior humeral head raises the
suspicion for underlying avascular necrosis. Soft tissues are
unremarkable.
IMPRESSION: 1. No acute fracture or dislocation, right shoulder.
2. Mild-to-moderate degenerative changes of the right glenohumeral
and acromioclavicular joints.
3. Somewhat sclerotic appearance of the superior humeral head raises
the suspicion for underlying avascular necrosis.

## 2019-09-11 IMAGING — DX DG FOOT COMPLETE 3+V*R*
3 series · 3 of 3 positions shown · non-contrast
Comparison: None.

CLINICAL DATA: Fall, right foot pain

EXAM:
RIGHT FOOT COMPLETE - 3+ VIEW

[foot ap]
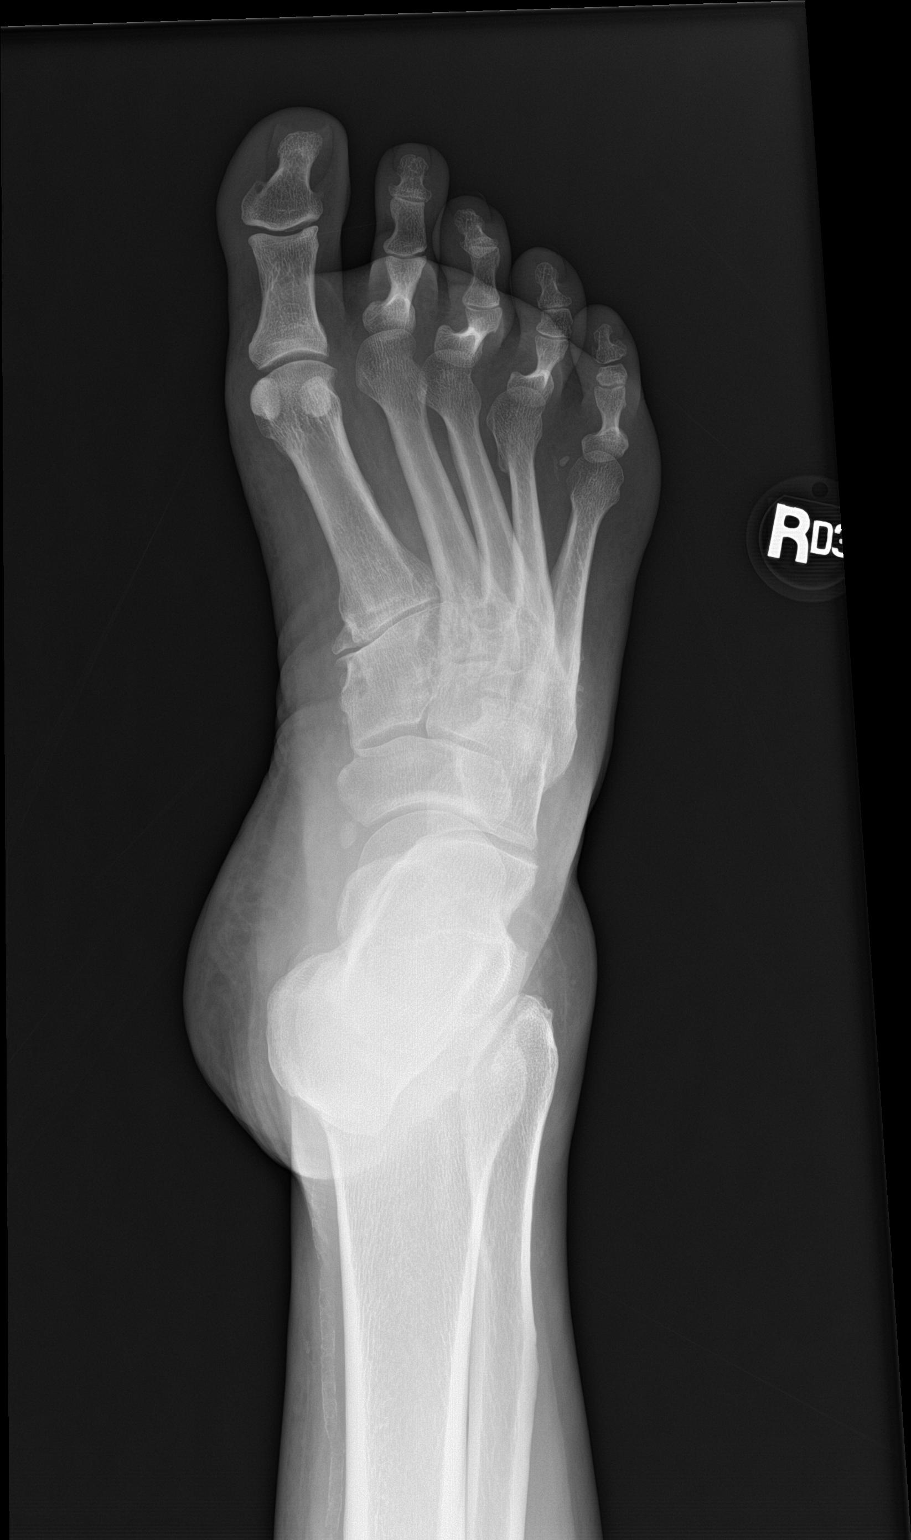

[foot obl]
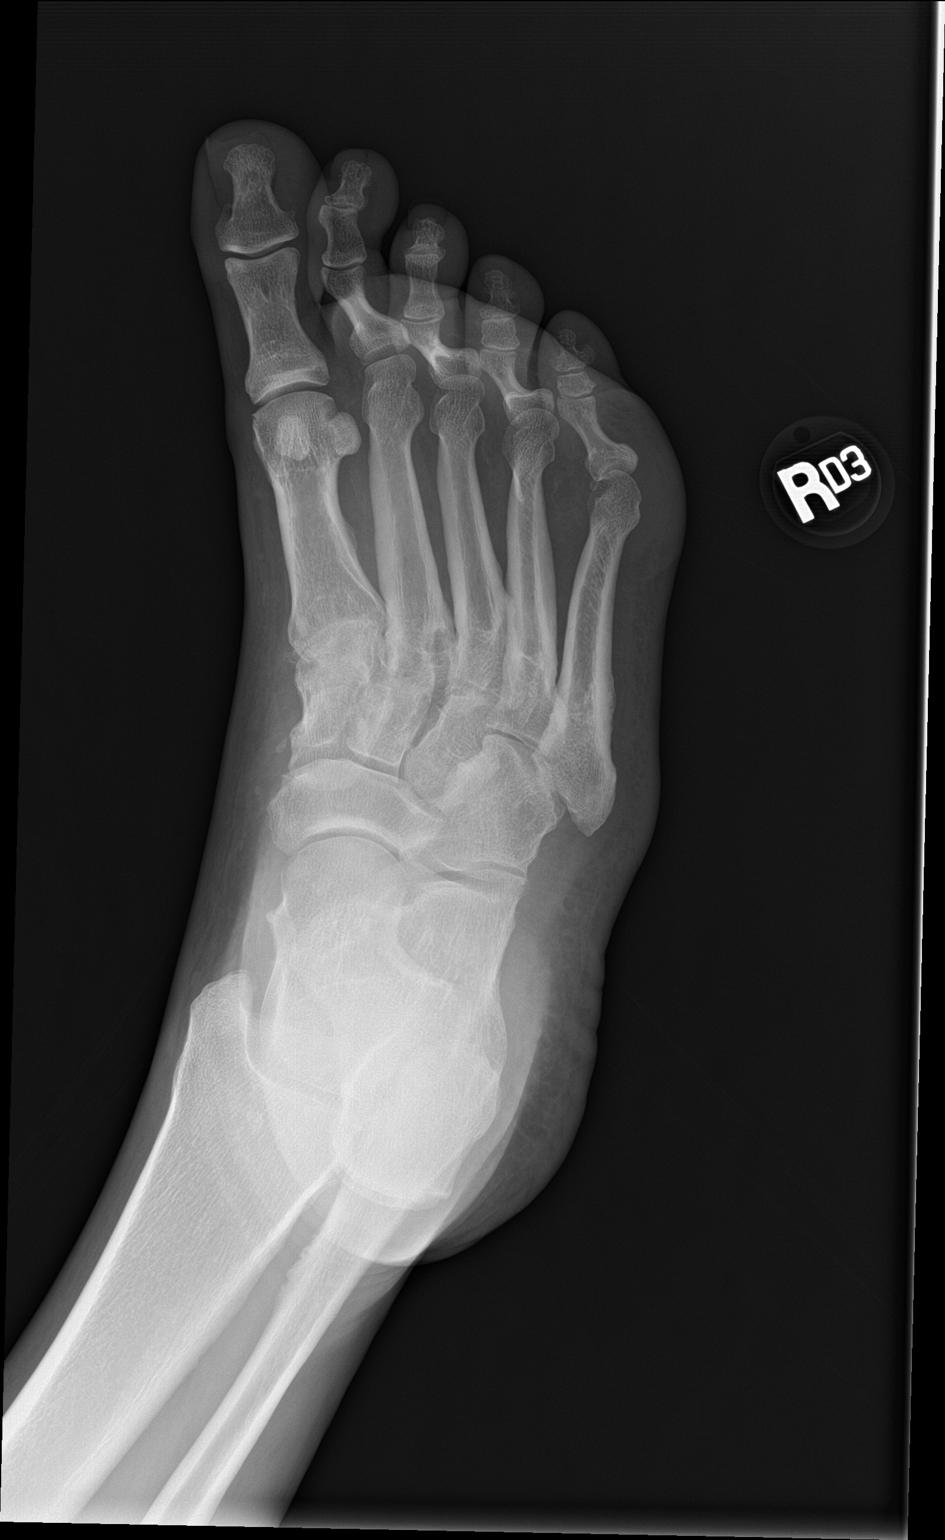

[foot lat]
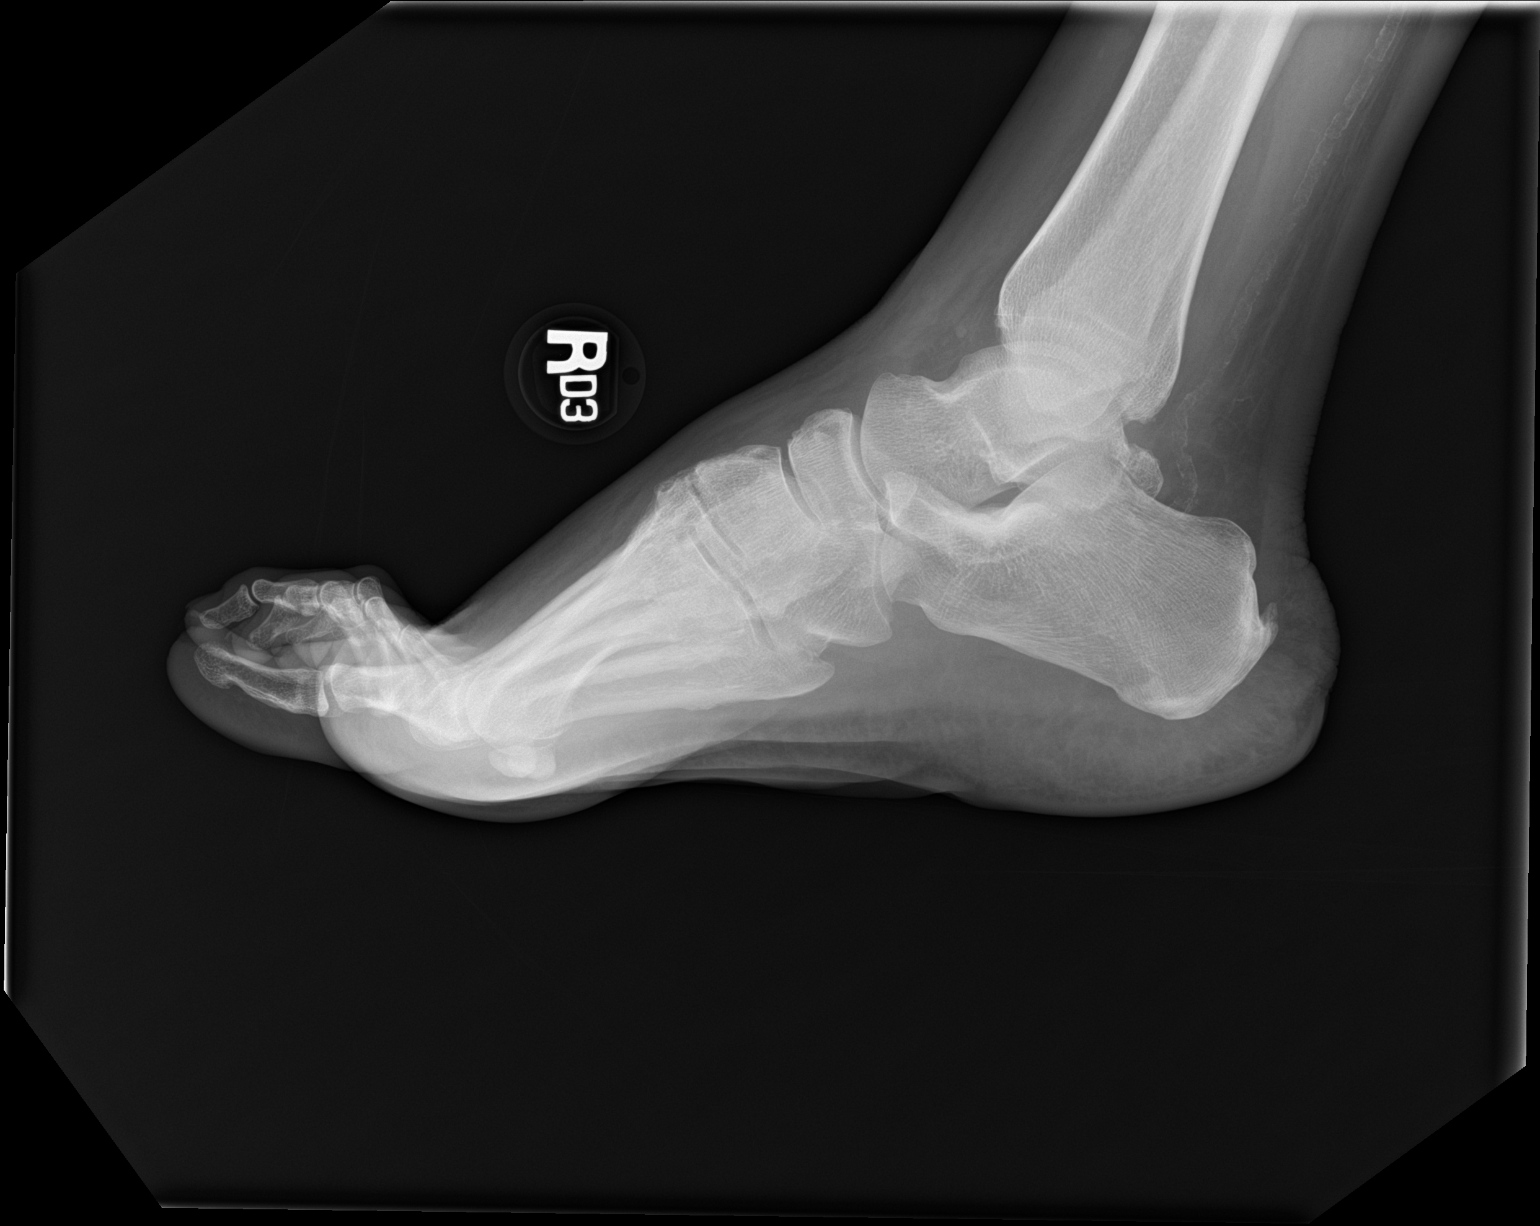

[3 of 3 positions shown; findings below may reference images not displayed]

FINDINGS: Three view radiograph right foot demonstrates mild varus angulation
of the a metatarsals, likely congenital in nature. Hammertoe
deformities of the a second, third, and fourth digits. There is no
acute fracture or dislocation. There is moderate degenerate
arthritis of the first, second, and third TMT joints, not well
characterized on this examination. Remaining joint spaces are
preserved. Small superior calcaneal spur. No ankle effusion.
Vascular calcifications are seen within the soft tissues.
IMPRESSION: Degenerative changes.  No acute fracture or dislocation.

## 2019-09-11 IMAGING — CR DG TIBIA/FIBULA 2V*R*
4 series · 4 of 4 positions shown · non-contrast
Comparison: None.

CLINICAL DATA: Right knee pain after fall

EXAM:
RIGHT TIBIA AND FIBULA - 2 VIEW

[t tib/fib ap right (1 of 2)]
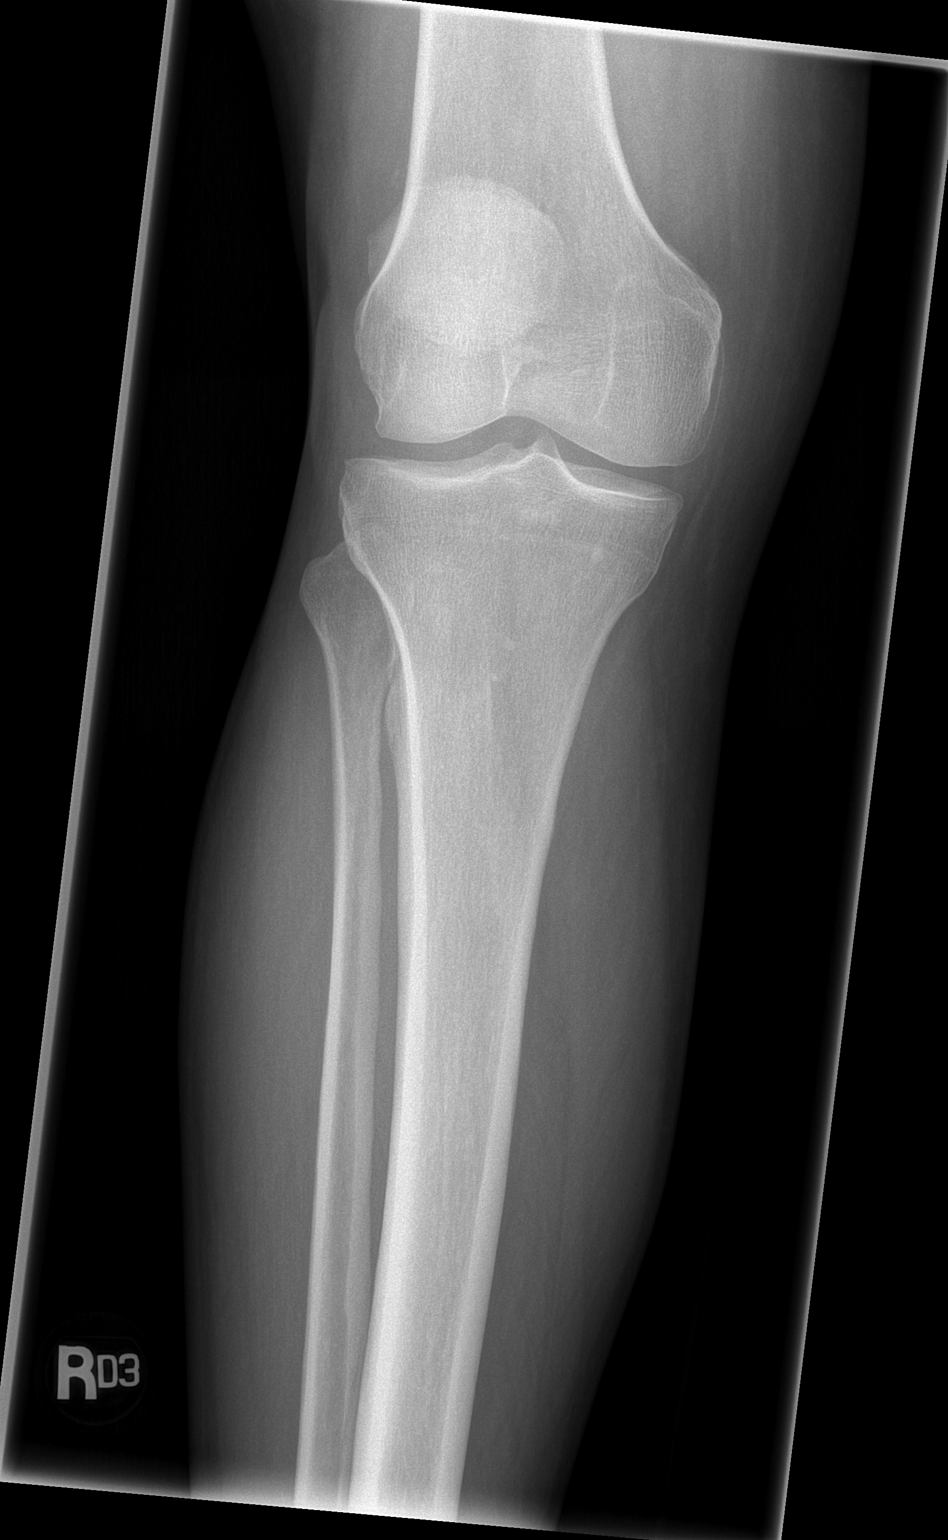

[t tib/fib ap right (2 of 2)]
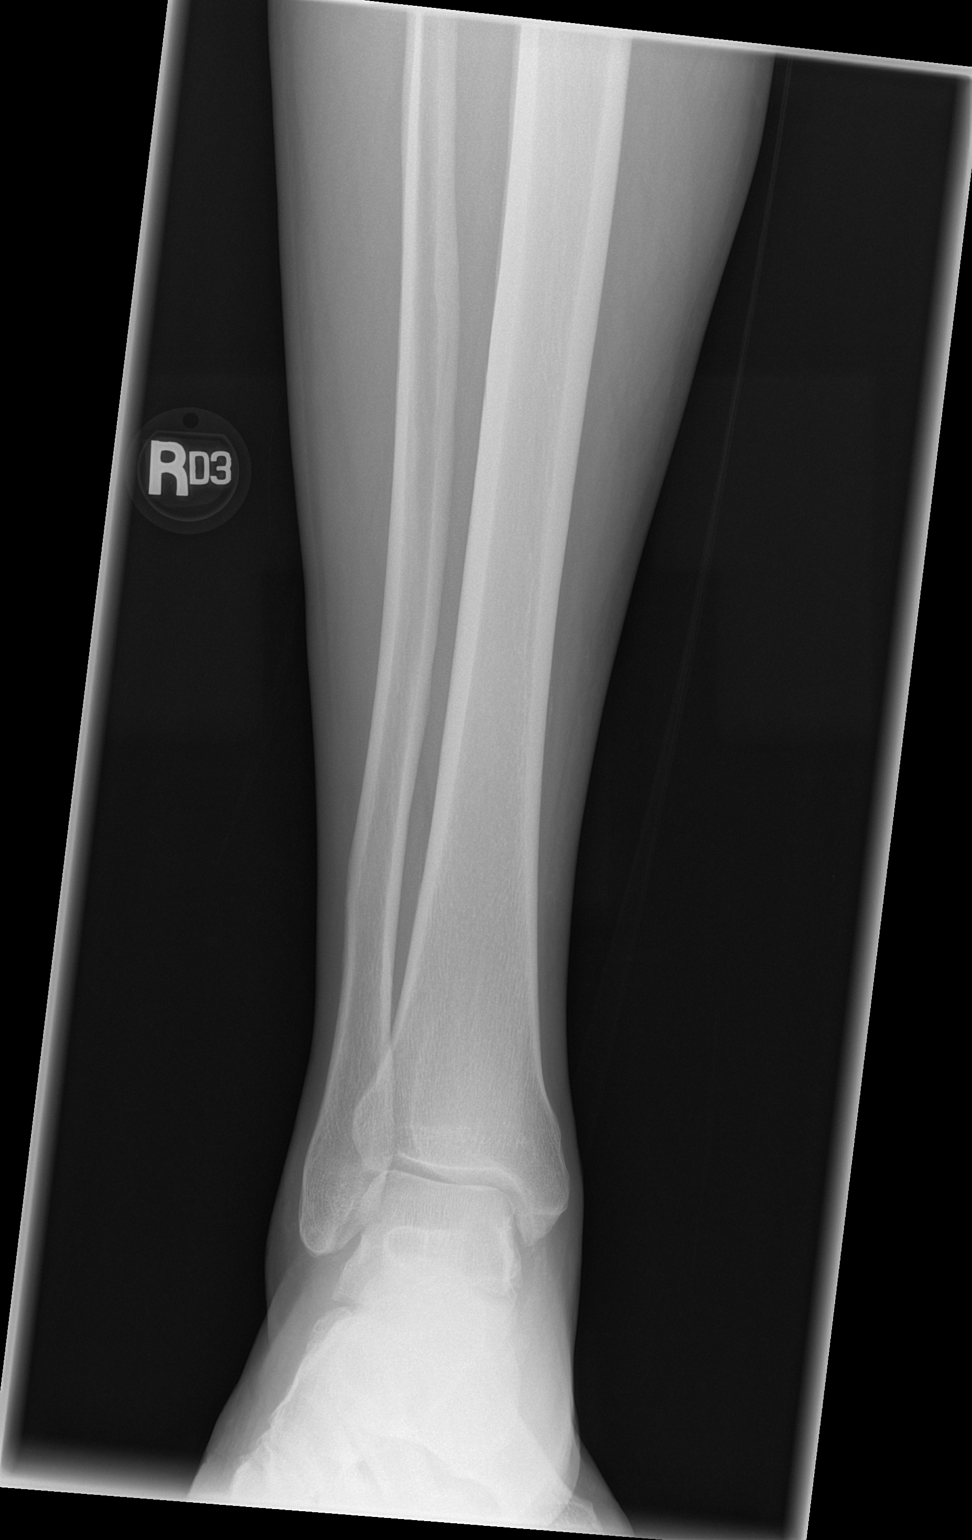

[t tib/fib lat right (1 of 2)]
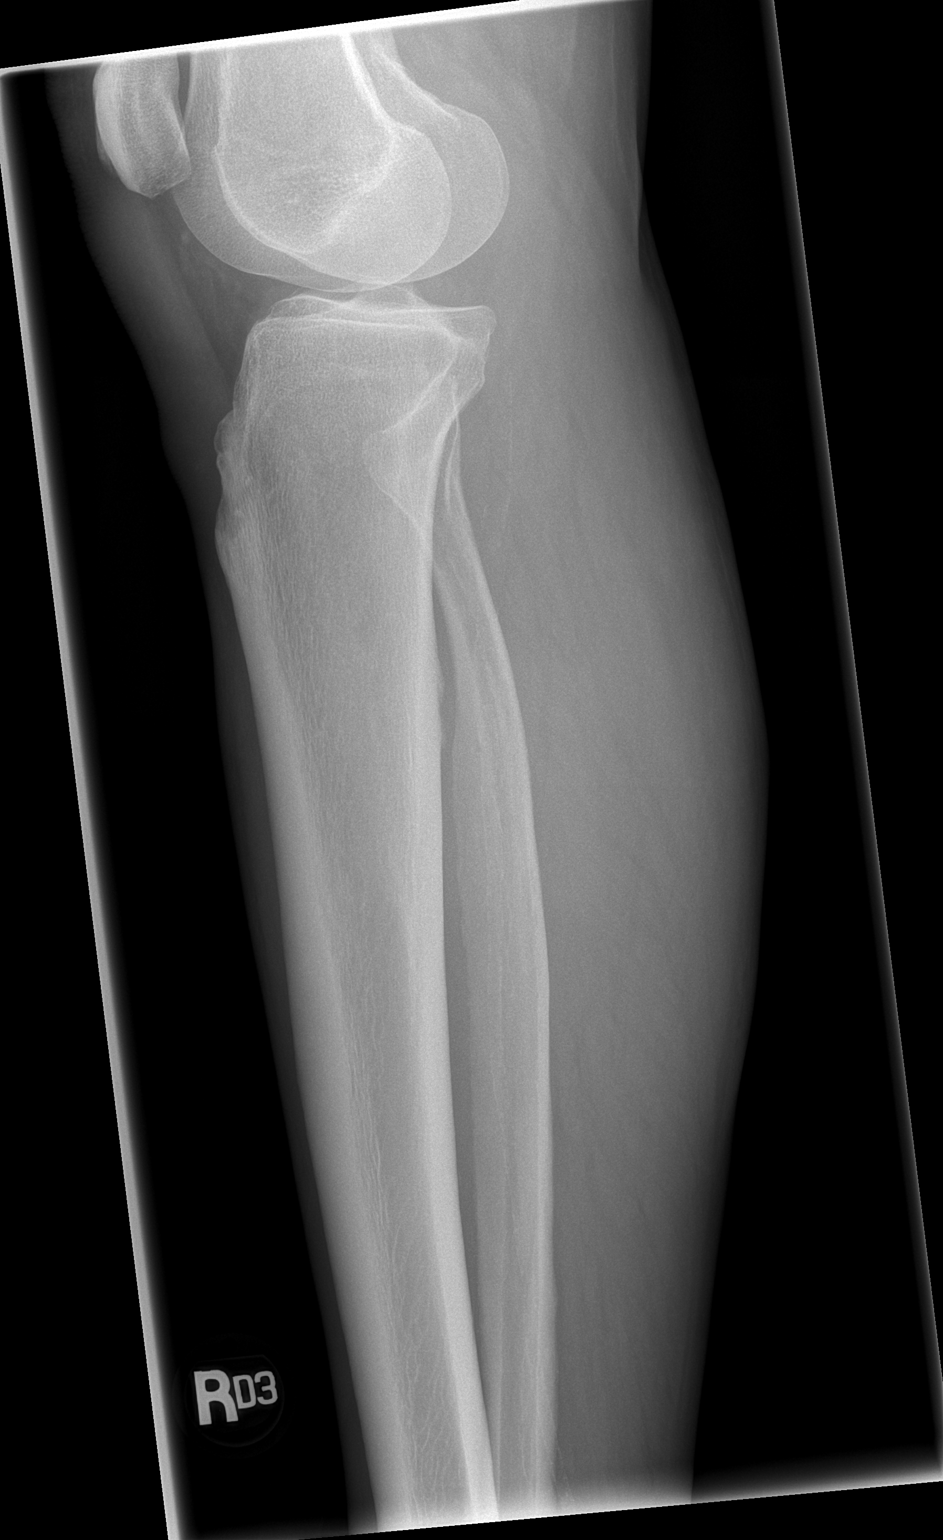

[t tib/fib lat right (2 of 2)]
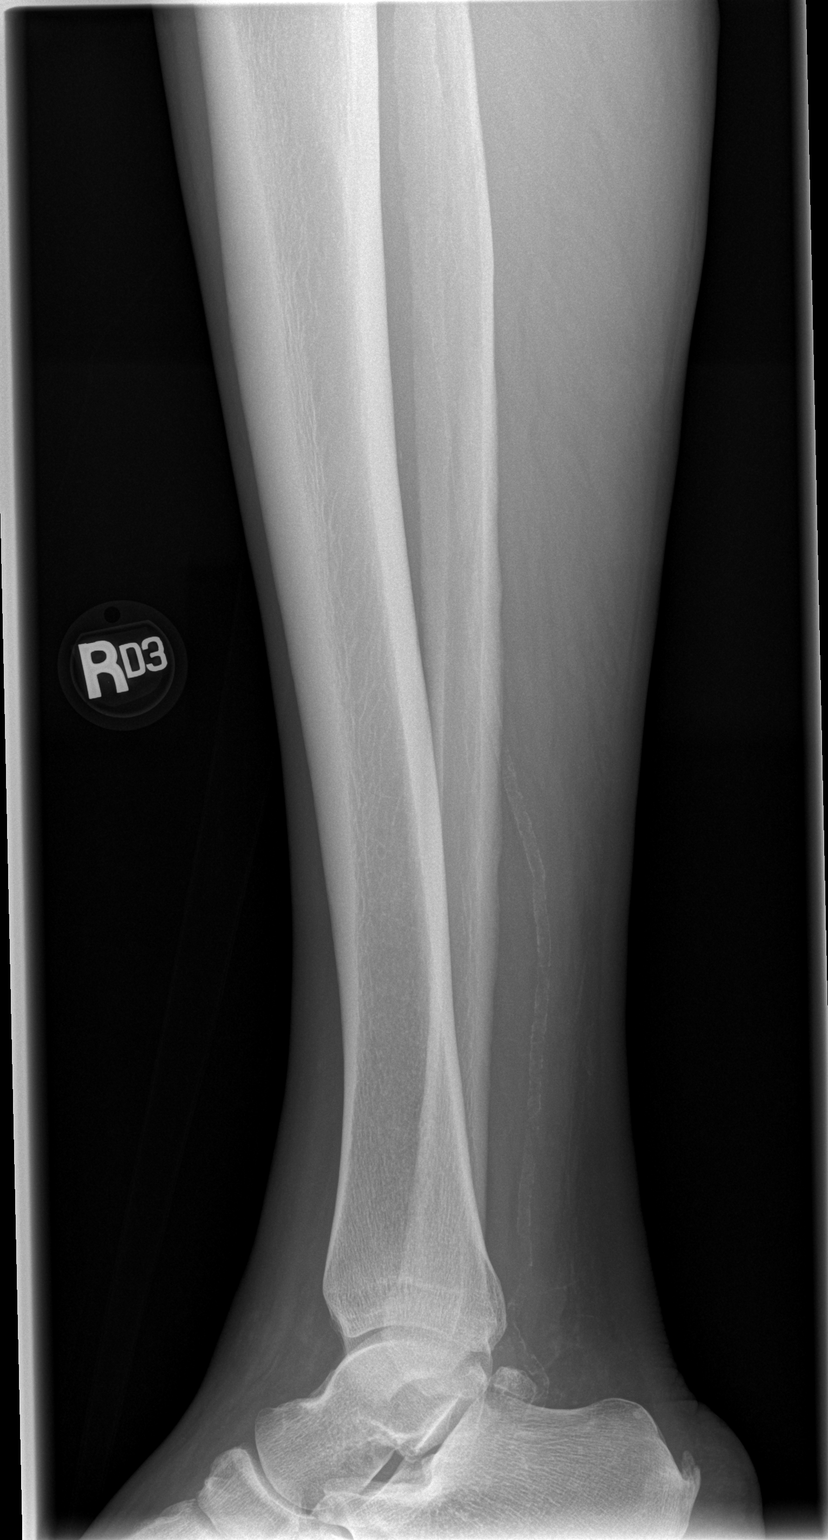

[4 of 4 positions shown; findings below may reference images not displayed]

FINDINGS: There is no evidence of fracture or other focal bone lesions.
Alignment at the knee and ankle are maintained. No focal soft tissue
swelling. Age advanced vascular calcifications.
IMPRESSION: Negative.

## 2019-09-11 IMAGING — CR DG FOOT COMPLETE 3+V*L*
3 series · 3 of 3 positions shown · non-contrast
Comparison: Left foot radiographs [DATE]

CLINICAL DATA: Pt arrives with c/o right knee pain and foot pain
after a fall on [REDACTED]. Pt also has a wound to the bottom of left
foot from [REDACTED].

EXAM:
LEFT FOOT - COMPLETE 3+ VIEW

[t foot ap left]
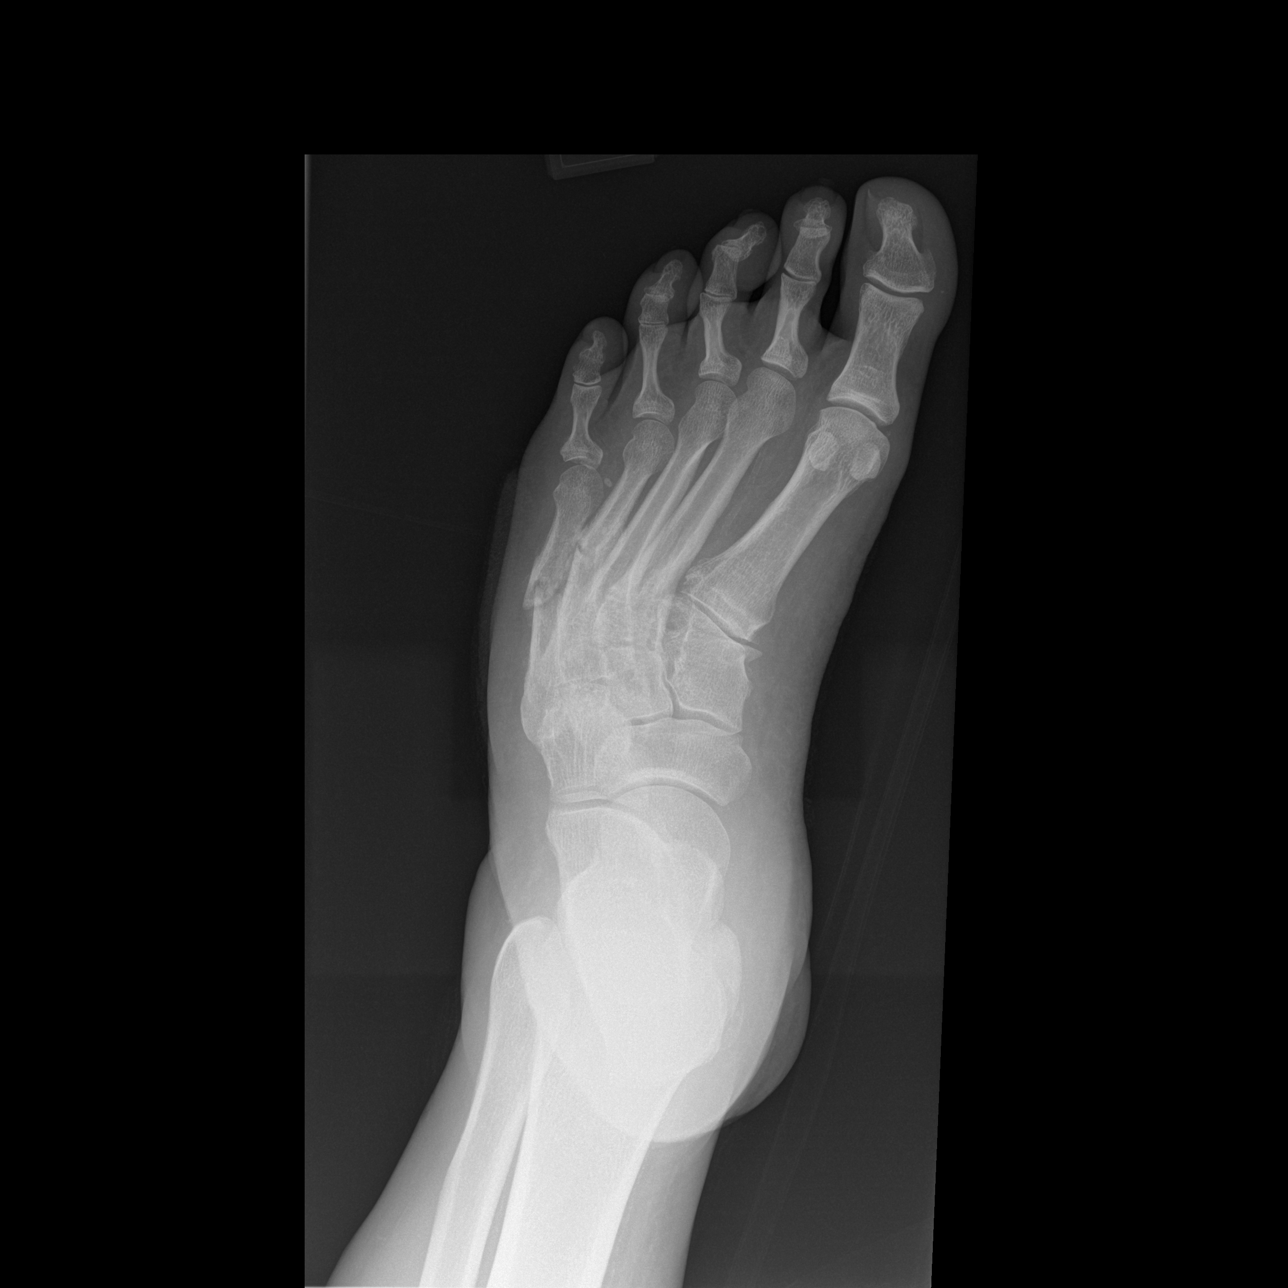

[t foot oblique left]
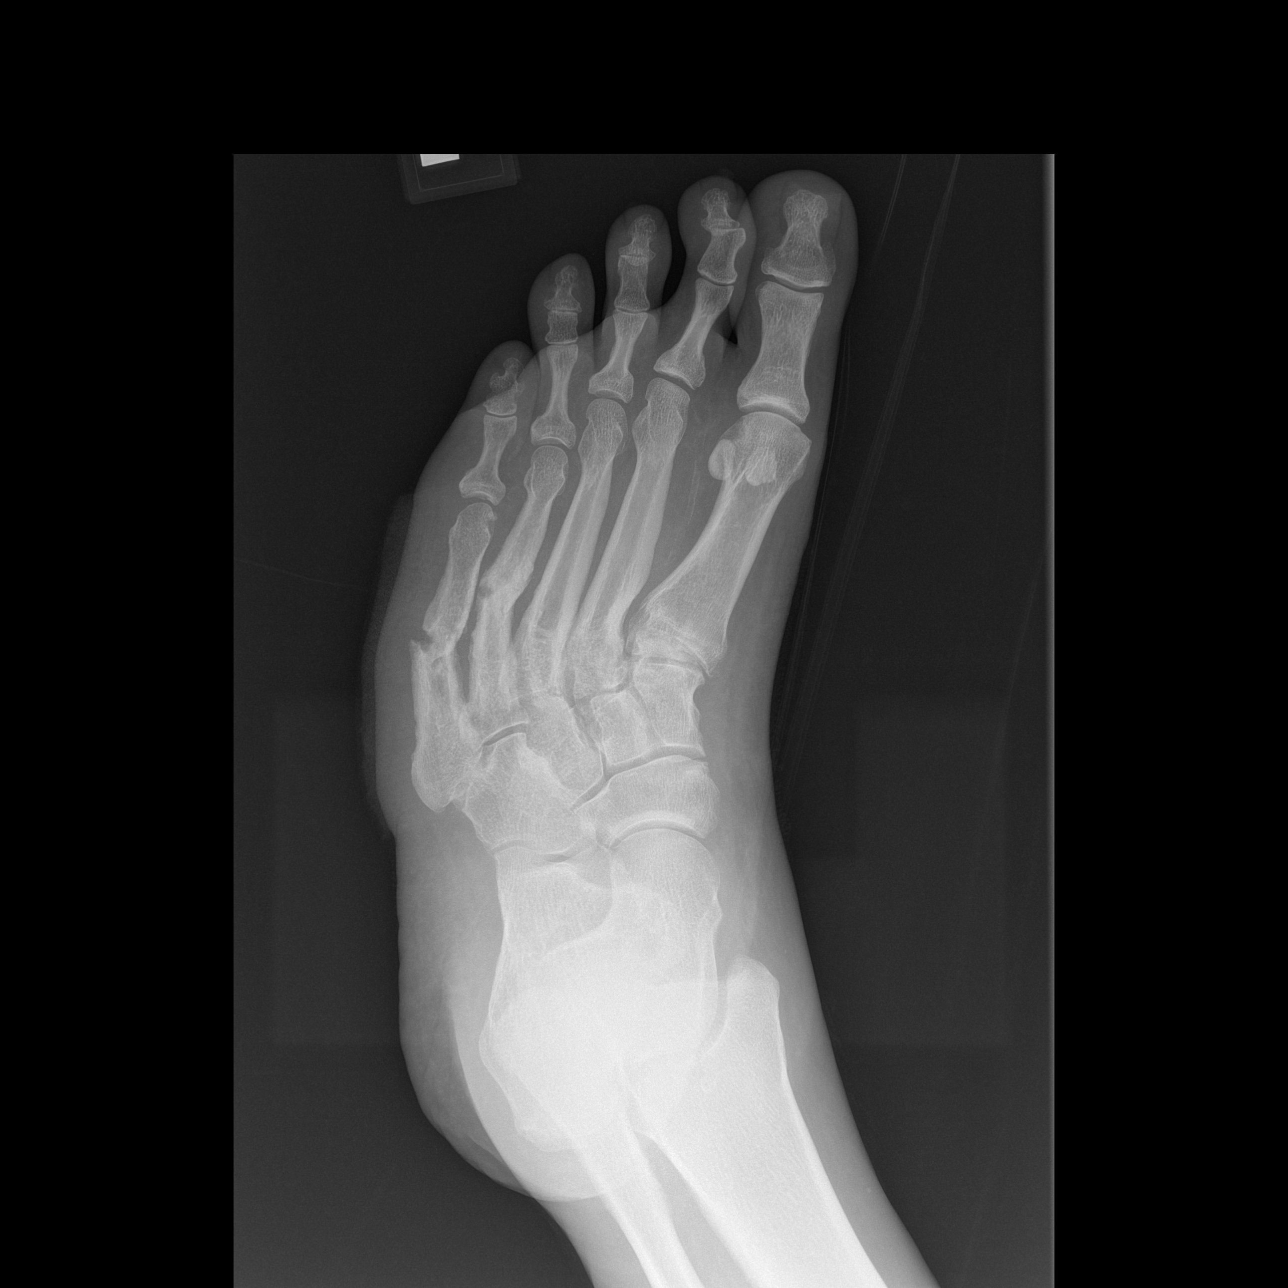

[t foot lat left]
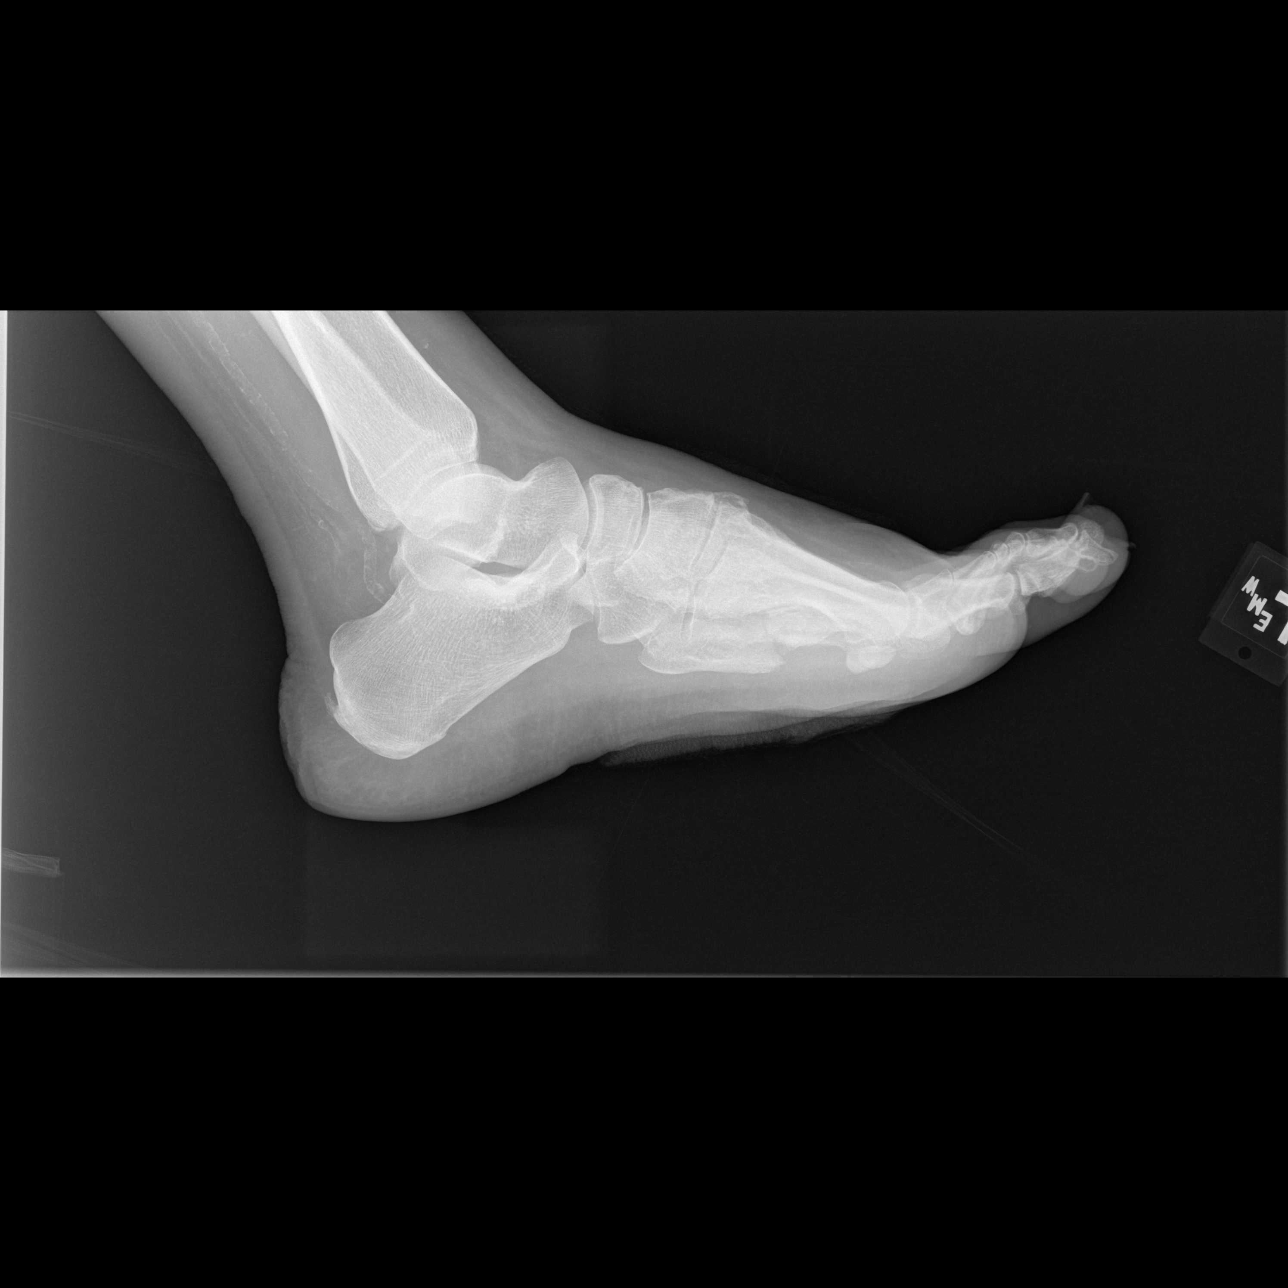

[3 of 3 positions shown; findings below may reference images not displayed]

FINDINGS: There are displaced and angulated fractures in the mid shafts of the
fourth and fifth metatarsals. There are degenerative changes
throughout the midfoot. Vascular calcifications are noted in the
regional cysts soft tissues.
IMPRESSION: Displaced and angulated fractures of the mid shafts of the fourth
and fifth metatarsals.

## 2019-09-11 MED ORDER — CIPROFLOXACIN HCL 500 MG PO TABS: 500.0000 mg | ORAL_TABLET | Freq: Two times a day (BID) | ORAL | 0 refills | Status: AC

## 2019-09-11 MED ORDER — CEPHALEXIN 500 MG PO CAPS: 500.0000 mg | ORAL_CAPSULE | Freq: Four times a day (QID) | ORAL | 0 refills | Status: AC

## 2019-09-11 MED ORDER — TETANUS-DIPHTH-ACELL PERTUSSIS 5-2.5-18.5 LF-MCG/0.5 IM SUSP
0.5000 mL | Freq: Once | INTRAMUSCULAR | Status: AC
  Administered 2019-09-11: 14:00:00 0.5 mL via INTRAMUSCULAR
  Filled 2019-09-11: qty 0.5

## 2019-09-11 MED ORDER — SODIUM CHLORIDE 0.9 % IV BOLUS
1000.0000 mL | Freq: Once | INTRAVENOUS | Status: AC
  Administered 2019-09-11: 15:00:00 1000 mL via INTRAVENOUS

## 2019-09-11 NOTE — ED Notes (Signed)
Patient transported to X-ray 

## 2019-09-11 NOTE — ED Notes (Signed)
Dr. Belfi at bedside 

## 2019-09-11 NOTE — ED Triage Notes (Signed)
Pt arrives with c/o right knee pain and foot pain after a fall on Saturday. Pt also has a wound to the bottom of left foot from Tuesday. Pt reports he had a staple in his shoe that he could not feel r/t his neuropathy, states when he got home he noticed blood in his shoe and that is how he discovered the wound.

## 2019-09-11 NOTE — Discharge Instructions (Addendum)
Want you to take both antibiotics for your cellulitis, I want you to speak to the pharmacist about any interactions that can be caused for example you could have some low blood sugar with your antibiotics and current diabetes medication, I want you to be aware of this and keep an eye on lightheadedness, nausea, vomiting, weakness, falling out.  If you start having any of the symptoms they stop taking your antibiotics.  I want you to speak to the pharmacist about this.  I want you to follow-up with the Ortho doctor in regards to your feet fractures and your shoulder in a week, you need to call them tomorrow to set up this appointment.  Please use the attached instructions.  If you start having any new or worsening concerning symptoms like high fever, numbness and tingling that is worsening, weakness please come back to the emergency department.  As we spoke about your blood sugar was high today in the emergency department, I want you to follow-up with your primary care about this as well.  If you have any new or worsening concerning symptoms come back to the ER.

## 2019-09-11 NOTE — ED Notes (Signed)
ED Provider at bedside. 

## 2019-09-11 NOTE — ED Provider Notes (Signed)
[facility] EMERGENCY DEPARTMENT Provider Note   CSN: [id number] Arrival date & time: 09/11/19  1104     History Chief Complaint  Patient presents with  . Foot Pain  . Knee Pain    Micheal FITZGERALD is a 49 y.o. male with pertinent past medical history of CHF, diabetes, fibromyalgia, hypertension, arthritis that presents the emergency department today for 2 complaints.  First complaint is patient fell on Saturday, states that he lives in a camper and fell through the camper onto the ground, states this is about 2 feet.  States that he hit his right knee on the ground and when he tried to get up to use his left foot and right shoulder, states that he think he injured both of these.  Did not hit his head, no LOC.  Did not hit anything else.  Was able to get up without any problems.  Was able to walk after this, wanted to come in today to get these checked out since he is having continuing knee pain specifically.  States that he has rotator cuff tears on bilateral shoulders constantly has pain, however states that the pain is little worse on the right shoulder today.  Has been using ice which has provided relief.  Does not know when last tetanus shot was.  Second complaint was that patient stepped on a staple gun stable on Tuesday, about a week ago.  States that he was walking around with a bloody shoe for the whole day, did not notice it because he has peripheral neuropathy due to diabetes. Is unsure if he stepped through with or without shoes on.  States that the wound is becoming bigger and is now ulcerating.  Stable was successfully removed from foot.  States that he is been able to walk on it, notices that it has been swelling.  Has been taking all his medications as directed.  Has not been taking any antibiotics.  Denies any chest pain, shortness of breath, fevers, chills, nausea, vomiting, abdominal pain, back pain, headache, vision changes, weakness, new paresthesias.  HPI     Past  Medical History:  Diagnosis Date  . Arthritis   . Asthma   . CHF (congestive heart failure) (HCC)   . Diabetes mellitus without complication (HCC)   . Fibromyalgia   . Gout   . Hypertension   . IBS (irritable bowel syndrome)   . Vein disorder     Patient Active Problem List   Diagnosis Date Noted  . Benign hypertension 07/23/2015  . Gastro-esophageal reflux disease with esophagitis 07/23/2015  . Gout 07/23/2015  . Obstructive sleep apnea 07/23/2015  . Snoring 07/23/2015  . Chronic idiopathic gout of multiple sites 06/02/2015  . Class 2 obesity in adult 06/02/2015  . Fibromyalgia 06/02/2015  . Irritable bowel syndrome with diarrhea 06/02/2015  . Mild persistent asthma 06/02/2015  . Reaction, adjustment, with depressed mood, prolonged 06/02/2015  . Type 2 diabetes mellitus without complication, without long-term current use of insulin (HCC) 06/02/2015    History reviewed. No pertinent surgical history.     No family history on file.  Social History   Tobacco Use  . Smoking status: Unknown If Ever Smoked  . Smokeless tobacco: Never Used  Substance Use Topics  . Alcohol use: Never  . Drug use: Never    Home Medications Prior to Admission medications   Medication Sig Start Date End Date Taking? Authorizing Provider  allopurinol (ZYLOPRIM) 300 MG tablet Take 300 mg by mouth daily.  Yes [provider]  chlorthalidone (HYGROTON) 25 MG tablet Take 25 mg by mouth daily.   Yes [provider]  cyclobenzaprine (FLEXERIL) 10 MG tablet Take 10 mg by mouth 3 (three) times daily as needed for muscle spasms.   Yes [provider]  Fluticasone Propionate, Inhal, (FLOVENT IN) Inhale into the lungs.   Yes [provider]  furosemide (LASIX) 20 MG tablet Take 20 mg by mouth.   Yes [provider]  glipiZIDE (GLUCOTROL) 10 MG tablet Take 10 mg by mouth daily before breakfast.   Yes [provider]  HYDROcodone-acetaminophen  (NORCO/VICODIN) 5-325 MG tablet Take 1 tablet by mouth every 6 (six) hours as needed for moderate pain.   Yes [provider]  nortriptyline (PAMELOR) 25 MG capsule Take 25 mg by mouth at bedtime.   Yes [provider]  pantoprazole (PROTONIX) 40 MG tablet Take 40 mg by mouth daily.   Yes [provider]  predniSONE (DELTASONE) 10 MG tablet Take 10 mg by mouth daily with breakfast.   Yes [provider]  verapamil (CALAN-SR) 240 MG CR tablet Take 240 mg by mouth at bedtime.   Yes [provider]  zolpidem (AMBIEN) 10 MG tablet Take 10 mg by mouth at bedtime as needed for sleep.   Yes [provider]  albuterol (PROVENTIL HFA;VENTOLIN HFA) 108 (90 Base) MCG/ACT inhaler Inhale into the lungs every 6 (six) hours as needed for wheezing or shortness of breath.    [provider]  amoxicillin-clavulanate (AUGMENTIN) 875-125 MG tablet Take 1 tablet by mouth 2 (two) times daily. 03/19/17   Vivi Barrack, DPM  buPROPion (WELLBUTRIN XL) 150 MG 24 hr tablet Take 150 mg by mouth every morning. 09/08/19   [provider]  cephALEXin (KEFLEX) 500 MG capsule Take 1 capsule (500 mg total) by mouth 4 (four) times daily for 7 days. 09/11/19 09/18/19  Farrel Gordon, PA-C  ciprofloxacin (CIPRO) 500 MG tablet Take 1 tablet (500 mg total) by mouth every 12 (twelve) hours for 7 days. 09/11/19 09/18/19  Farrel Gordon, PA-C  citalopram (CELEXA) 40 MG tablet Take 40 mg by mouth daily.    [provider]  lidocaine (LIDODERM) 5 % Place 1 patch onto the skin daily. Remove & Discard patch within 12 hours or as directed by MD 05/12/17   Rise Mu, PA-C  meloxicam (MOBIC) 15 MG tablet Take 15 mg by mouth daily.    [provider]  metFORMIN (GLUCOPHAGE) 1000 MG tablet Take 1 tablet (1,000 mg total) by mouth 2 (two) times daily. 03/16/17   Rolland Porter, MD  mometasone-formoterol [facility]) 100-5 MCG/ACT AERO Inhale 2 puffs into the lungs 2  (two) times daily.    [provider]  montelukast (SINGULAIR) 10 MG tablet Take 10 mg by mouth at bedtime.    [provider]  nitroGLYCERIN (NITRODUR - DOSED IN MG/24 HR) 0.2 mg/hr patch Place 0.2 mg onto the skin daily.    [provider]  pioglitazone (ACTOS) 15 MG tablet Take 15 mg by mouth daily. 08/11/19   [provider]  sulfamethoxazole-trimethoprim (BACTRIM DS,SEPTRA DS) 800-160 MG tablet Take 1 tablet by mouth 2 (two) times daily. 05/12/17   Rise Mu, PA-C  traMADol (ULTRAM) 50 MG tablet Take 1 tablet (50 mg total) by mouth every 6 (six) hours as needed. 03/16/17   Rolland Porter, MD    Allergies    Azithromycin and Lodine [etodolac]  Review of Systems  Review of Systems  Constitutional: Negative for chills, diaphoresis, fatigue and fever.  HENT: Negative for congestion, sore throat and trouble swallowing.   Eyes: Negative for pain and visual disturbance.  Respiratory: Negative for cough, shortness of breath and wheezing.   Cardiovascular: Negative for chest pain, palpitations and leg swelling.  Gastrointestinal: Negative for abdominal distention, abdominal pain, diarrhea, nausea and vomiting.  Genitourinary: Negative for difficulty urinating.  Musculoskeletal: Positive for arthralgias and joint swelling. Negative for back pain, neck pain and neck stiffness.  Skin: Positive for wound. Negative for pallor and rash.  Neurological: Negative for dizziness, speech difficulty, weakness and headaches.  Psychiatric/Behavioral: Negative for confusion.    Physical Exam Updated Vital Signs BP (!) 159/97 (BP Location: Right Arm)   Pulse 81   Temp 98.2 F (36.8 C) (Oral)   Resp 16   Ht  (1.88 m)   Wt 111.1 kg   SpO2 98%   BMI 31.46 kg/m   Physical Exam Constitutional:      General: He is not in acute distress.    Appearance: Normal appearance. He is not ill-appearing, toxic-appearing or diaphoretic.  HENT:     Mouth/Throat:      Mouth: Mucous membranes are moist.     Pharynx: Oropharynx is clear.  Eyes:     General: No scleral icterus.    Extraocular Movements: Extraocular movements intact.     Pupils: Pupils are equal, round, and reactive to light.  Cardiovascular:     Rate and Rhythm: Normal rate and regular rhythm.     Pulses: Normal pulses.     Heart sounds: Normal heart sounds.  Pulmonary:     Effort: Pulmonary effort is normal. No respiratory distress.     Breath sounds: Normal breath sounds. No stridor. No wheezing, rhonchi or rales.  Chest:     Chest wall: No tenderness.  Abdominal:     General: Abdomen is flat. There is no distension.     Palpations: Abdomen is soft.     Tenderness: There is no abdominal tenderness. There is no guarding or rebound.  Musculoskeletal:        General: No swelling or tenderness. Normal range of motion.     Cervical back: Normal range of motion and neck supple. No rigidity.     Right lower leg: No edema.     Left lower leg: No edema.     Comments: Upper extremity: Able to passively and actively range shoulder joints in all directions.  No pain to shoulder joint, no overlying signs of infection, no warmth or crepitus noticed.  Normal strength.  Normal sensation. Lower extremity: Left knee without any abnormalities.  Right knee with tenderness to lateral aspect, normal laxity test.  No overlying signs of infection, no edema.  Able to range knee in all directions passively and actively.  Normal strength.  Normal sensation.  Right and left foot with minimal sensation due to peripheral neuropathy, this is unchanged.  Normal strength.  Pulses equal.  Skin:    General: Skin is warm and dry.     Capillary Refill: Capillary refill takes less than 2 seconds.     Coloration: Skin is not pale.     Findings: Lesion present.     Comments: Left foot with dime sized ulceration, 1 mm thick to plantar foot, below fifth metatarsal.  Ulcer is not draining.  No palpable foreign bodies  noticed.  No overlying erythema or induration warmth noted on entire left foot.  Pedal edema  noticed to midfoot with mild erythema.  Neurological:     General: No focal deficit present.     Mental Status: He is alert and oriented to person, place, and time.  Psychiatric:        Mood and Affect: Mood normal.        Behavior: Behavior normal.     ED Results / Procedures / Treatments   Labs (all labs ordered are listed, but only abnormal results are displayed) Labs Reviewed  CBC WITH DIFFERENTIAL/PLATELET - Abnormal; Notable for the following components:      Result Value   WBC 12.0 (*)    Neutro Abs 9.3 (*)    All other components within normal limits  COMPREHENSIVE METABOLIC PANEL - Abnormal; Notable for the following components:   Chloride 91 (*)    Glucose, Bld 408 (*)    All other components within normal limits    EKG None  Radiology DG Shoulder Right  Result Date: 09/11/2019 CLINICAL DATA:  Right shoulder pain after recent fall EXAM: RIGHT SHOULDER - 2+ VIEW COMPARISON:  None. FINDINGS: There is no evidence of fracture or dislocation. Mild-to-moderate degenerative changes of the glenohumeral and acromioclavicular joints. Sclerotic appearance of the superior humeral head raises the suspicion for underlying avascular necrosis. Soft tissues are unremarkable. IMPRESSION: 1. No acute fracture or dislocation, right shoulder. 2. Mild-to-moderate degenerative changes of the right glenohumeral and acromioclavicular joints. 3. Somewhat sclerotic appearance of the superior humeral head raises the suspicion for underlying avascular necrosis. Electronically Signed   By: Duanne Guess D.O.   On: 09/11/2019 14:13   DG Tibia/Fibula Right  Result Date: 09/11/2019 CLINICAL DATA:  Right knee pain after fall EXAM: RIGHT TIBIA AND FIBULA - 2 VIEW COMPARISON:  None. FINDINGS: There is no evidence of fracture or other focal bone lesions. Alignment at the knee and ankle are maintained. No focal  soft tissue swelling. Age advanced vascular calcifications. IMPRESSION: Negative. Electronically Signed   By: Duanne Guess D.O.   On: 09/11/2019 14:11   DG Knee Complete 4 Views Right  Result Date: 09/11/2019 CLINICAL DATA:  Pt arrives with c/o right knee pain and foot pain after a fall on Saturday. EXAM: RIGHT KNEE - COMPLETE 4+ VIEW COMPARISON:  None. FINDINGS: No evidence of fracture, dislocation, or joint effusion. No evidence of arthropathy or other focal bone abnormality. Soft tissues are unremarkable. There is nonspecific bony expansion of the proximal fibular shaft, incompletely visualized. IMPRESSION: 1. No acute osseous abnormality in the right knee. 2. Nonspecific bony expansion of the proximal fibular shaft, incompletely visualized. Consider dedicated radiographs of the tibia and fibula. Electronically Signed   By: Emmaline Kluver M.D.   On: 09/11/2019 11:57   DG Foot Complete Left  Result Date: 09/11/2019 CLINICAL DATA:  Pt arrives with c/o right knee pain and foot pain after a fall on Saturday. Pt also has a wound to the bottom of left foot from Tuesday. EXAM: LEFT FOOT - COMPLETE 3+ VIEW COMPARISON:  Left foot radiographs 03/16/2017 FINDINGS: There are displaced and angulated fractures in the mid shafts of the fourth and fifth metatarsals. There are degenerative changes throughout the midfoot. Vascular calcifications are noted in the regional cysts soft tissues. IMPRESSION: Displaced and angulated fractures of the mid shafts of the fourth and fifth metatarsals. Electronically Signed   By: Emmaline Kluver M.D.   On: 09/11/2019 11:54   DG Foot Complete Right  Result Date: 09/11/2019 CLINICAL DATA:  Fall, right foot pain EXAM: RIGHT FOOT  COMPLETE - 3+ VIEW COMPARISON:  None. FINDINGS: Three view radiograph right foot demonstrates mild varus angulation of the a metatarsals, likely congenital in nature. Hammertoe deformities of the a second, third, and fourth digits. There is no acute  fracture or dislocation. There is moderate degenerate arthritis of the first, second, and third TMT joints, not well characterized on this examination. Remaining joint spaces are preserved. Small superior calcaneal spur. No ankle effusion. Vascular calcifications are seen within the soft tissues. IMPRESSION: Degenerative changes.  No acute fracture or dislocation. Electronically Signed   By: Helyn Numbers MD   On: 09/11/2019 15:36    Procedures Procedures (including critical care time)  Medications Ordered in ED Medications  Tdap (BOOSTRIX) injection 0.5 mL (0.5 mLs Intramuscular Given 09/11/19 1345)  sodium chloride 0.9 % bolus 1,000 mL (0 mLs Intravenous Stopped 09/11/19 1543)    ED Course  I have reviewed the triage vital signs and the nursing notes.  Pertinent labs & imaging results that were available during my care of the patient were reviewed by me and considered in my medical decision making (see chart for details).    MDM Rules/Calculators/A&P                         TAKSH HJORT is a 49 y.o. male with pertinent past medical history of CHF, diabetes, fibromyalgia, hypertension, arthritis that presents the emergency department today for fall and wound on left foot.  Patient is seeing podiatrist on Wednesday.  Patient given tetanus here today.  Stable vitals, leukocytosis to 12.  No other abnormalities on CBC. CMP without any abnormalities except for glucose in the 400s, will give IV fluids today.  Patient has not taken diabetes medications today.  Patient states that glucose is normally in the 500s at home. Plain films with displaced and angulated fractures of midshaft of fourth and fifth metatarsal, will consult Ortho.  Plain films of other extremities without any acute changes, did speak to patient about questionable avascular necrosis of right shoulder, patient agreeable to follow-up with Ortho.  Did speak to patient about all other incidental findings as well.  251 spoke to Dr.  Everardo Pacific, orthopedic surgery who states to put patient in nonweightbearing boot and to follow-up in office in 1 week.  Patient to be discharged with Ortho follow-up for metatarsal fractures and shoulder and knee.  Given cam boot with crutches and knee sleeve today.  Given Cipro and Keflex for cellulitis and possible puncture wound.  Patient given instructions and to consult pharmacist for interactions from all the medications he is taking.  Patient agreeable.  Patient aware of side effects of these medications.  Patient to follow-up with primary care about high blood sugar.  I discussed this case with my attending physician who cosigned this note including patient's presenting symptoms, physical exam, and planned diagnostics and interventions. Attending physician stated agreement with plan or made changes to plan which were implemented.   Attending physician assessed patient at bedside.  Final Clinical Impression(s) / ED Diagnoses Final diagnoses:  Fall  Cellulitis of foot  Closed displaced fracture of fifth metatarsal bone of left foot, initial encounter  Closed displaced fracture of fourth metatarsal bone of left foot, initial encounter    Rx / DC Orders ED Discharge Orders         Ordered    cephALEXin (KEFLEX) 500 MG capsule  4 times daily     Discontinue  Reprint  09/11/19 1654    ciprofloxacin (CIPRO) 500 MG tablet  Every 12 hours     Discontinue  Reprint     09/11/19 1654           Farrel Gordon, PA-C 09/11/19 1957    Rolan Bucco, MD 09/14/19 [phone]

## 2019-09-13 ENCOUNTER — Other Ambulatory Visit: Payer: Self-pay

## 2019-09-13 ENCOUNTER — Encounter: Payer: Self-pay | Admitting: Podiatry

## 2019-09-13 ENCOUNTER — Ambulatory Visit (INDEPENDENT_AMBULATORY_CARE_PROVIDER_SITE_OTHER): Payer: Self-pay | Admitting: Podiatry

## 2019-09-13 DIAGNOSIS — E11610 Type 2 diabetes mellitus with diabetic neuropathic arthropathy: Secondary | ICD-10-CM

## 2019-09-13 DIAGNOSIS — S92342A Displaced fracture of fourth metatarsal bone, left foot, initial encounter for closed fracture: Secondary | ICD-10-CM

## 2019-09-13 DIAGNOSIS — E1142 Type 2 diabetes mellitus with diabetic polyneuropathy: Secondary | ICD-10-CM

## 2019-09-13 DIAGNOSIS — L97422 Non-pressure chronic ulcer of left heel and midfoot with fat layer exposed: Secondary | ICD-10-CM

## 2019-09-13 DIAGNOSIS — E11621 Type 2 diabetes mellitus with foot ulcer: Secondary | ICD-10-CM

## 2019-09-13 DIAGNOSIS — Q66229 Congenital metatarsus adductus, unspecified foot: Secondary | ICD-10-CM

## 2019-09-13 DIAGNOSIS — S92352A Displaced fracture of fifth metatarsal bone, left foot, initial encounter for closed fracture: Secondary | ICD-10-CM

## 2019-09-13 NOTE — Progress Notes (Signed)
Subjective:  Patient ID: Micheal Bell, male    DOB: Feb 05, 1971,  MRN: [id number]  Chief Complaint  Patient presents with  . Wound Check    L plantar midfoot. Pt stated, "I discovered that a staple was sticking out of my shoe last Tuesday (8 days ago). The day after I noticed it, I found a wound on my foot. I went to the ER. They prescribed antibiotics and said that I also had a fracture. I don't remember any injury. I have neuropathy, so it doesn't hurt. No fever/chills/pus/foul odor/N&V. I just received the antibiotics yesterday, so I'm still taking them. I wear my boot from the ER, wrap the wound in gauze, and use salve".  . Diabetes    H/o neuropathy. A1c = 14 per pt. Pt stated, "My glucose has been 400mg /dL for awhile - it was like that before I got the wound/injury. I haven't checked it in awhile".    49 y.o. male presents with the above complaint. History confirmed with patient.   He lives in a camper and fell through the floor on Saturday last weekend, landing and injuring his R foot and shoulder. He also stepped on a staple gun about 2 weeks ago and thinks he had a staple in his left foot and caused a wound and is worsening. He is diabetic and his A1c has been very high for some time, his last 1 was 14%.  States he has not really received much medical care, sees a primary care doctor with [facility], [facility].  He states he has not worked for some time, he has been disabled for some time.  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  Objective:  Physical Exam: warm, good capillary refill, normal DP and PT pulses, reduced sensation grossly throughout the left lower extremity and ulceration at plantar lateral midfoot submet 5 base. Left Foot: C-shaped flap type with prominent fifth metatarsal base with overlying ulceration, this is full-thickness with exposed subcutaneous tissue.  Fibrotic  base.  No signs of infection.     Radiographs: X-ray of both feet: Films from [facility] ER 09/11/2019 reviewed.  There are transverse mildly displaced fractures of the fourth and fifth metatarsal diaphyses on the left foot with surrounding callus formation.  On the right foot there is subchondral sclerosis and joint space narrowing on the right foot with what looks like prior fragmentation of the midtarsal joint Assessment:   1. Type 2 diabetes mellitus with diabetic polyneuropathy, without long-term current use of insulin (HCC)   2. Diabetic ulcer of left midfoot associated with type 2 diabetes mellitus, with fat layer exposed (HCC)   3. Closed displaced fracture of fourth metatarsal bone of left foot, initial encounter   4. Closed displaced fracture of fifth metatarsal bone of left foot, initial encounter   5. Charcot's arthropathy, diabetic (HCC)   6. Metatarsus adductus      Plan:  Patient was evaluated and treated and all questions answered.  -I had a long discussion with him today regarding his left foot fractures and ulceration of the left foot as well as his worsening uncontrolled diabetes and overall health.  His A1c is severely elevated at 14%, he has an ulceration of the plantar foot with recent trauma and deformity of the foot.  I believe he is at high risk of developing Charcot arthropathy, and he has some evidence of  this already on the right side as well.  The combination of these severe pedal issues means that he is at high risk for minor or major amputation in the near future.  He currently is unable to work and pays for all his healthcare expenses out-of-pocket which is a huge financial strain on him and his mother.  I spoke with his disability advocate today Archie Endo, I agree with the patient and his advocate that he has severe health issues and complications resulting from them in his left lower extremity that put him at complete long-term disability.  Additionally, he  requires some sort of insurance and coverage in order to take care of his multiple health issues and left foot issues and to prevent significant morbidity, mortality, and amputation in the future.  I encouraged with him and his advocate to not hesitate to reach out to me if they require any supportive documentation for me regarding his pedal issues.  Long-term, his issues will require weekly or bimonthly visits and treatment for debridement of the wound, offloading with both prefabricated and custom molded bracing, serial radiographs and potential surgical intervention to heal his ulcer and prevent ulceration in the future. -I discussed with him that he develops any cellulitis, purulent drainage, or systemic symptoms of infection he should proceed to the nearest emergency room -A Peg assist offloading device was applied into his CAM boot.  He should remain nonweightbearing as much as possible with crutches to avoid weight and pressure on the left lower extremity -He has Bactroban ointment from the emergency room at home, he should continue to apply this daily.  Procedure: Excisional Debridement of Wound Indication: Removal of non-viable soft tissue from the wound to promote healing.  Anesthesia: none Pre-Debridement Wound Measurements: 2.1 cm x 1.8 cm x 0.3 cm  Post-Debridement Wound Measurements: 2.1 cm x 1.8 cm x 0.3 cm  Type of Debridement: Sharp Excisional Tissue Removed: Non-viable soft tissue Instrumentation: 15 blade and tissue nipper Depth of Debridement: subcutaneous tissue. Technique: Sharp excisional debridement to bleeding, viable wound base.  Dressing: Dry, sterile, compression dressing. Disposition: Patient tolerated procedure well. Patient to return in 1 week for follow-up.    Return in about 4 weeks (around 10/11/2019) for wound re-check.    Sharl Ma, DPM 09/13/2019

## 2019-09-13 NOTE — Patient Instructions (Signed)
Monitor for any signs/symptoms of infection. Signs of an infection could be redness beyond the site of the incision/procedure/wound, foul smelling odor, drainage that is thick and yellow or green, or severe swelling and pain. Call the office immediately if any occur or go directly to the emergency room. Call with any questions/concerns.    Wear the CAM boot with the peg insert daily. Avoid putting weight on the left foot as much as possible with crutches.   Change the dressing daily with a large band aid and the ointment from the hospital. If you run out, over the counter neosporin will work fine.

## 2019-09-15 DIAGNOSIS — M25551 Pain in right hip: Secondary | ICD-10-CM | POA: Diagnosis not present

## 2019-09-15 DIAGNOSIS — M25511 Pain in right shoulder: Secondary | ICD-10-CM | POA: Diagnosis not present

## 2019-09-15 DIAGNOSIS — M25561 Pain in right knee: Secondary | ICD-10-CM | POA: Diagnosis not present

## 2019-09-24 DIAGNOSIS — I219 Acute myocardial infarction, unspecified: Secondary | ICD-10-CM

## 2019-09-24 DIAGNOSIS — I639 Cerebral infarction, unspecified: Secondary | ICD-10-CM

## 2019-09-24 DIAGNOSIS — J189 Pneumonia, unspecified organism: Secondary | ICD-10-CM

## 2019-09-24 HISTORY — DX: Pneumonia, unspecified organism: J18.9

## 2019-09-24 HISTORY — DX: Cerebral infarction, unspecified: I63.9

## 2019-09-24 HISTORY — DX: Acute myocardial infarction, unspecified: I21.9

## 2019-10-03 DIAGNOSIS — Z23 Encounter for immunization: Secondary | ICD-10-CM | POA: Diagnosis not present

## 2019-10-05 ENCOUNTER — Ambulatory Visit (INDEPENDENT_AMBULATORY_CARE_PROVIDER_SITE_OTHER): Payer: Self-pay | Admitting: Podiatry

## 2019-10-05 ENCOUNTER — Other Ambulatory Visit: Payer: Self-pay

## 2019-10-05 DIAGNOSIS — E11621 Type 2 diabetes mellitus with foot ulcer: Secondary | ICD-10-CM

## 2019-10-05 DIAGNOSIS — S92352A Displaced fracture of fifth metatarsal bone, left foot, initial encounter for closed fracture: Secondary | ICD-10-CM

## 2019-10-05 DIAGNOSIS — S92342A Displaced fracture of fourth metatarsal bone, left foot, initial encounter for closed fracture: Secondary | ICD-10-CM

## 2019-10-05 DIAGNOSIS — E11610 Type 2 diabetes mellitus with diabetic neuropathic arthropathy: Secondary | ICD-10-CM

## 2019-10-05 DIAGNOSIS — Q66229 Congenital metatarsus adductus, unspecified foot: Secondary | ICD-10-CM

## 2019-10-05 DIAGNOSIS — L97422 Non-pressure chronic ulcer of left heel and midfoot with fat layer exposed: Secondary | ICD-10-CM

## 2019-10-05 DIAGNOSIS — E1142 Type 2 diabetes mellitus with diabetic polyneuropathy: Secondary | ICD-10-CM

## 2019-10-05 NOTE — Progress Notes (Signed)
  Subjective:  Patient ID: Micheal Bell, male    DOB: 1970-07-26,  MRN: [id number]  Chief Complaint  Patient presents with  . Wound Check    L plantar midfoot. Pt stated, "I think it's probably better. The dressing came off yesterday, so I left it off. Wearing the boot and using the salve". No fever/chills/N&V/foul odor per pt.    49 y.o. male presents with the above complaint. History confirmed with patient.  Received notification that he qualified for Medicaid, and then later did not qualify.  He is trying to work out the details with his advocate.  Still wearing the CAM boot and the peg assist device.  Applying mupirocin daily.  Feels he is doing well.  Objective:  Physical Exam: warm, good capillary refill, normal DP and PT pulses, reduced sensation grossly throughout the left lower extremity and ulceration at plantar lateral midfoot submet 5 base. Left Foot: C-shaped flap type with prominent fifth metatarsal base with overlying ulceration, this is full-thickness with exposed subcutaneous tissue.  Granular wound base today with a hyperkeratotic rim.  Much improved since visit.  Posterior debridement measures 1.5 cm x 1.5 cm x 0.3 cm     Assessment:   1. Type 2 diabetes mellitus with diabetic polyneuropathy, without long-term current use of insulin (HCC)   2. Diabetic ulcer of left midfoot associated with type 2 diabetes mellitus, with fat layer exposed (HCC)   3. Closed displaced fracture of fourth metatarsal bone of left foot, initial encounter   4. Closed displaced fracture of fifth metatarsal bone of left foot, initial encounter   5. Metatarsus adductus   6. Charcot's arthropathy, diabetic (HCC)      Plan:  Patient was evaluated and treated and all questions answered.  -Encouraged he has been hopefully able to get health coverage.  This would be critical to continue wound care visits as well as his overall prognosis and limb salvage effort. -Wound was debrided  today. -Continue WBAT in CAM boot, advised to rest as much as possible -Continue peg assist device -Need x-rays at next visit to evaluate metatarsal fractures -May consider total contact casting in the future  Procedure: Excisional Debridement of Wound Indication: Removal of non-viable soft tissue from the wound to promote healing.  Anesthesia: none Pre-Debridement Wound Measurements: 1.5 cm x 1.5 cm x 0.3 cm  Post-Debridement Wound Measurements: 1.5 cm x 1.5 cm x 0.3 cm Type of Debridement: Sharp selective Tissue Removed: Non-viable soft tissue Instrumentation: 15 blade and tissue nipper Depth of Debridement: subcutaneous tissue. Technique: Sharp selective debridement to bleeding, viable wound base.  Dressing: Dry, sterile, compression dressing. Disposition: Patient tolerated procedure well. Patient to return in 1 week for follow-up.    No follow-ups on file.    Sharl Ma, DPM 10/05/2019

## 2019-10-09 ENCOUNTER — Other Ambulatory Visit: Payer: Self-pay

## 2019-10-09 ENCOUNTER — Emergency Department (HOSPITAL_BASED_OUTPATIENT_CLINIC_OR_DEPARTMENT_OTHER): Payer: Medicaid Other

## 2019-10-09 ENCOUNTER — Inpatient Hospital Stay (HOSPITAL_BASED_OUTPATIENT_CLINIC_OR_DEPARTMENT_OTHER)
Admission: EM | Admit: 2019-10-09 | Discharge: 2019-11-15 | DRG: 871 | Disposition: A | Discharge status: Home Health | Payer: Medicaid Other | Attending: Internal Medicine | Admitting: Internal Medicine

## 2019-10-09 ENCOUNTER — Encounter (HOSPITAL_BASED_OUTPATIENT_CLINIC_OR_DEPARTMENT_OTHER): Payer: Self-pay

## 2019-10-09 DIAGNOSIS — I110 Hypertensive heart disease with heart failure: Secondary | ICD-10-CM | POA: Diagnosis present

## 2019-10-09 DIAGNOSIS — Z4659 Encounter for fitting and adjustment of other gastrointestinal appliance and device: Secondary | ICD-10-CM

## 2019-10-09 DIAGNOSIS — S92353A Displaced fracture of fifth metatarsal bone, unspecified foot, initial encounter for closed fracture: Secondary | ICD-10-CM | POA: Diagnosis present

## 2019-10-09 DIAGNOSIS — E1165 Type 2 diabetes mellitus with hyperglycemia: Secondary | ICD-10-CM | POA: Diagnosis present

## 2019-10-09 DIAGNOSIS — B9561 Methicillin susceptible Staphylococcus aureus infection as the cause of diseases classified elsewhere: Secondary | ICD-10-CM

## 2019-10-09 DIAGNOSIS — S92343A Displaced fracture of fourth metatarsal bone, unspecified foot, initial encounter for closed fracture: Secondary | ICD-10-CM | POA: Diagnosis present

## 2019-10-09 DIAGNOSIS — R0902 Hypoxemia: Secondary | ICD-10-CM

## 2019-10-09 DIAGNOSIS — I2510 Atherosclerotic heart disease of native coronary artery without angina pectoris: Secondary | ICD-10-CM | POA: Diagnosis present

## 2019-10-09 DIAGNOSIS — J1282 Pneumonia due to coronavirus disease 2019: Secondary | ICD-10-CM | POA: Diagnosis present

## 2019-10-09 DIAGNOSIS — U071 COVID-19: Secondary | ICD-10-CM | POA: Diagnosis not present

## 2019-10-09 DIAGNOSIS — Z789 Other specified health status: Secondary | ICD-10-CM

## 2019-10-09 DIAGNOSIS — G4733 Obstructive sleep apnea (adult) (pediatric): Secondary | ICD-10-CM | POA: Diagnosis present

## 2019-10-09 DIAGNOSIS — Z09 Encounter for follow-up examination after completed treatment for conditions other than malignant neoplasm: Secondary | ICD-10-CM

## 2019-10-09 DIAGNOSIS — I10 Essential (primary) hypertension: Secondary | ICD-10-CM | POA: Diagnosis not present

## 2019-10-09 DIAGNOSIS — N390 Urinary tract infection, site not specified: Secondary | ICD-10-CM | POA: Diagnosis present

## 2019-10-09 DIAGNOSIS — R739 Hyperglycemia, unspecified: Secondary | ICD-10-CM

## 2019-10-09 DIAGNOSIS — E876 Hypokalemia: Secondary | ICD-10-CM | POA: Diagnosis present

## 2019-10-09 DIAGNOSIS — Z794 Long term (current) use of insulin: Secondary | ICD-10-CM

## 2019-10-09 DIAGNOSIS — Z79899 Other long term (current) drug therapy: Secondary | ICD-10-CM

## 2019-10-09 DIAGNOSIS — E785 Hyperlipidemia, unspecified: Secondary | ICD-10-CM | POA: Diagnosis present

## 2019-10-09 DIAGNOSIS — Z978 Presence of other specified devices: Secondary | ICD-10-CM

## 2019-10-09 DIAGNOSIS — Z6832 Body mass index (BMI) 32.0-32.9, adult: Secondary | ICD-10-CM

## 2019-10-09 DIAGNOSIS — J22 Unspecified acute lower respiratory infection: Secondary | ICD-10-CM

## 2019-10-09 DIAGNOSIS — J80 Acute respiratory distress syndrome: Secondary | ICD-10-CM

## 2019-10-09 DIAGNOSIS — E669 Obesity, unspecified: Secondary | ICD-10-CM | POA: Diagnosis present

## 2019-10-09 DIAGNOSIS — G92 Toxic encephalopathy: Secondary | ICD-10-CM | POA: Diagnosis not present

## 2019-10-09 DIAGNOSIS — E1140 Type 2 diabetes mellitus with diabetic neuropathy, unspecified: Secondary | ICD-10-CM | POA: Diagnosis present

## 2019-10-09 DIAGNOSIS — L602 Onychogryphosis: Secondary | ICD-10-CM

## 2019-10-09 DIAGNOSIS — I749 Embolism and thrombosis of unspecified artery: Secondary | ICD-10-CM

## 2019-10-09 DIAGNOSIS — I82431 Acute embolism and thrombosis of right popliteal vein: Secondary | ICD-10-CM | POA: Diagnosis not present

## 2019-10-09 DIAGNOSIS — M109 Gout, unspecified: Secondary | ICD-10-CM | POA: Diagnosis present

## 2019-10-09 DIAGNOSIS — B951 Streptococcus, group B, as the cause of diseases classified elsewhere: Secondary | ICD-10-CM | POA: Diagnosis present

## 2019-10-09 DIAGNOSIS — E119 Type 2 diabetes mellitus without complications: Secondary | ICD-10-CM

## 2019-10-09 DIAGNOSIS — L02612 Cutaneous abscess of left foot: Secondary | ICD-10-CM

## 2019-10-09 DIAGNOSIS — R7881 Bacteremia: Secondary | ICD-10-CM

## 2019-10-09 DIAGNOSIS — M797 Fibromyalgia: Secondary | ICD-10-CM | POA: Diagnosis present

## 2019-10-09 DIAGNOSIS — R0602 Shortness of breath: Secondary | ICD-10-CM

## 2019-10-09 DIAGNOSIS — E11621 Type 2 diabetes mellitus with foot ulcer: Secondary | ICD-10-CM | POA: Diagnosis present

## 2019-10-09 DIAGNOSIS — I5021 Acute systolic (congestive) heart failure: Secondary | ICD-10-CM

## 2019-10-09 DIAGNOSIS — J45909 Unspecified asthma, uncomplicated: Secondary | ICD-10-CM | POA: Diagnosis present

## 2019-10-09 DIAGNOSIS — E872 Acidosis: Secondary | ICD-10-CM | POA: Diagnosis present

## 2019-10-09 DIAGNOSIS — E118 Type 2 diabetes mellitus with unspecified complications: Secondary | ICD-10-CM

## 2019-10-09 DIAGNOSIS — R509 Fever, unspecified: Secondary | ICD-10-CM

## 2019-10-09 DIAGNOSIS — Z8673 Personal history of transient ischemic attack (TIA), and cerebral infarction without residual deficits: Secondary | ICD-10-CM

## 2019-10-09 DIAGNOSIS — M869 Osteomyelitis, unspecified: Secondary | ICD-10-CM | POA: Diagnosis not present

## 2019-10-09 DIAGNOSIS — R230 Cyanosis: Secondary | ICD-10-CM | POA: Diagnosis not present

## 2019-10-09 DIAGNOSIS — R198 Other specified symptoms and signs involving the digestive system and abdomen: Secondary | ICD-10-CM

## 2019-10-09 DIAGNOSIS — J9601 Acute respiratory failure with hypoxia: Secondary | ICD-10-CM

## 2019-10-09 DIAGNOSIS — E1169 Type 2 diabetes mellitus with other specified complication: Secondary | ICD-10-CM | POA: Diagnosis not present

## 2019-10-09 DIAGNOSIS — G8929 Other chronic pain: Secondary | ICD-10-CM | POA: Diagnosis present

## 2019-10-09 DIAGNOSIS — B958 Unspecified staphylococcus as the cause of diseases classified elsewhere: Secondary | ICD-10-CM | POA: Diagnosis present

## 2019-10-09 DIAGNOSIS — A4189 Other specified sepsis: Principal | ICD-10-CM | POA: Diagnosis present

## 2019-10-09 DIAGNOSIS — E870 Hyperosmolality and hypernatremia: Secondary | ICD-10-CM | POA: Diagnosis present

## 2019-10-09 DIAGNOSIS — E871 Hypo-osmolality and hyponatremia: Secondary | ICD-10-CM | POA: Diagnosis present

## 2019-10-09 DIAGNOSIS — K589 Irritable bowel syndrome without diarrhea: Secondary | ICD-10-CM | POA: Diagnosis present

## 2019-10-09 DIAGNOSIS — Z888 Allergy status to other drugs, medicaments and biological substances status: Secondary | ICD-10-CM

## 2019-10-09 DIAGNOSIS — I5023 Acute on chronic systolic (congestive) heart failure: Secondary | ICD-10-CM | POA: Diagnosis present

## 2019-10-09 LAB — CBC WITH DIFFERENTIAL/PLATELET
Abs Immature Granulocytes: 0.06 10*3/uL (ref 0.00–0.07)
Basophils Absolute: 0 10*3/uL (ref 0.0–0.1)
Basophils Relative: 0 %
Eosinophils Absolute: 0 10*3/uL (ref 0.0–0.5)
Eosinophils Relative: 0 %
HCT: 43.6 % (ref 39.0–52.0)
Hemoglobin: 15.1 g/dL (ref 13.0–17.0)
Immature Granulocytes: 1 %
Lymphocytes Relative: 8 %
Lymphs Abs: 0.7 10*3/uL (ref 0.7–4.0)
MCH: 30.5 pg (ref 26.0–34.0)
MCHC: 34.6 g/dL (ref 30.0–36.0)
MCV: 88.1 fL (ref 80.0–100.0)
Monocytes Absolute: 0.3 10*3/uL (ref 0.1–1.0)
Monocytes Relative: 3 %
Neutro Abs: 8.1 10*3/uL — ABNORMAL HIGH (ref 1.7–7.7)
Neutrophils Relative %: 88 %
Platelets: 139 10*3/uL — ABNORMAL LOW (ref 150–400)
RBC: 4.95 MIL/uL (ref 4.22–5.81)
RDW: 11.9 % (ref 11.5–15.5)
WBC: 9.1 10*3/uL (ref 4.0–10.5)
nRBC: 0 % (ref 0.0–0.2)

## 2019-10-09 LAB — COMPREHENSIVE METABOLIC PANEL
ALT: 33 U/L (ref 0–44)
AST: 47 U/L — ABNORMAL HIGH (ref 15–41)
Albumin: 3.6 g/dL (ref 3.5–5.0)
Alkaline Phosphatase: 92 U/L (ref 38–126)
Anion gap: 16 — ABNORMAL HIGH (ref 5–15)
BUN: 23 mg/dL — ABNORMAL HIGH (ref 6–20)
CO2: 27 mmol/L (ref 22–32)
Calcium: 8.6 mg/dL — ABNORMAL LOW (ref 8.9–10.3)
Chloride: 82 mmol/L — ABNORMAL LOW (ref 98–111)
Creatinine, Ser: 1.04 mg/dL (ref 0.61–1.24)
GFR calc Af Amer: >60 mL/min (ref >60)
GFR calc non Af Amer: >60 mL/min (ref >60)
Glucose, Bld: 464 mg/dL — ABNORMAL HIGH (ref 70–99)
Potassium: 2.9 mmol/L — ABNORMAL LOW (ref 3.5–5.1)
Sodium: 125 mmol/L — ABNORMAL LOW (ref 135–145)
Total Bilirubin: 0.9 mg/dL (ref 0.3–1.2)
Total Protein: 7.6 g/dL (ref 6.5–8.1)

## 2019-10-09 LAB — URINALYSIS, MICROSCOPIC (REFLEX): WBC, UA: NONE SEEN WBC/hpf (ref 0–5)

## 2019-10-09 LAB — URINALYSIS, ROUTINE W REFLEX MICROSCOPIC
Bilirubin Urine: NEGATIVE
Glucose, UA: >=500 mg/dL — AB
Ketones, ur: 15 mg/dL — AB
Leukocytes,Ua: NEGATIVE
Nitrite: POSITIVE — AB
Protein, ur: 100 mg/dL — AB
Specific Gravity, Urine: 1.02 (ref 1.005–1.030)
pH: 6.5 (ref 5.0–8.0)

## 2019-10-09 LAB — LACTIC ACID, PLASMA
Lactic Acid, Venous: 2.4 mmol/L (ref 0.5–1.9)
Lactic Acid, Venous: 2.2 mmol/L (ref 0.5–1.9)

## 2019-10-09 LAB — SARS CORONAVIRUS 2 BY RT PCR (HOSPITAL ORDER, PERFORMED IN ~~LOC~~ HOSPITAL LAB): SARS Coronavirus 2: POSITIVE — AB

## 2019-10-09 IMAGING — DX DG CHEST 1V PORT
1 series · 1 of 1 positions shown · non-contrast
Comparison: None.

CLINICAL DATA: Cough, fever, shortness of breath

EXAM:
PORTABLE CHEST 1 VIEW

[chest ap]
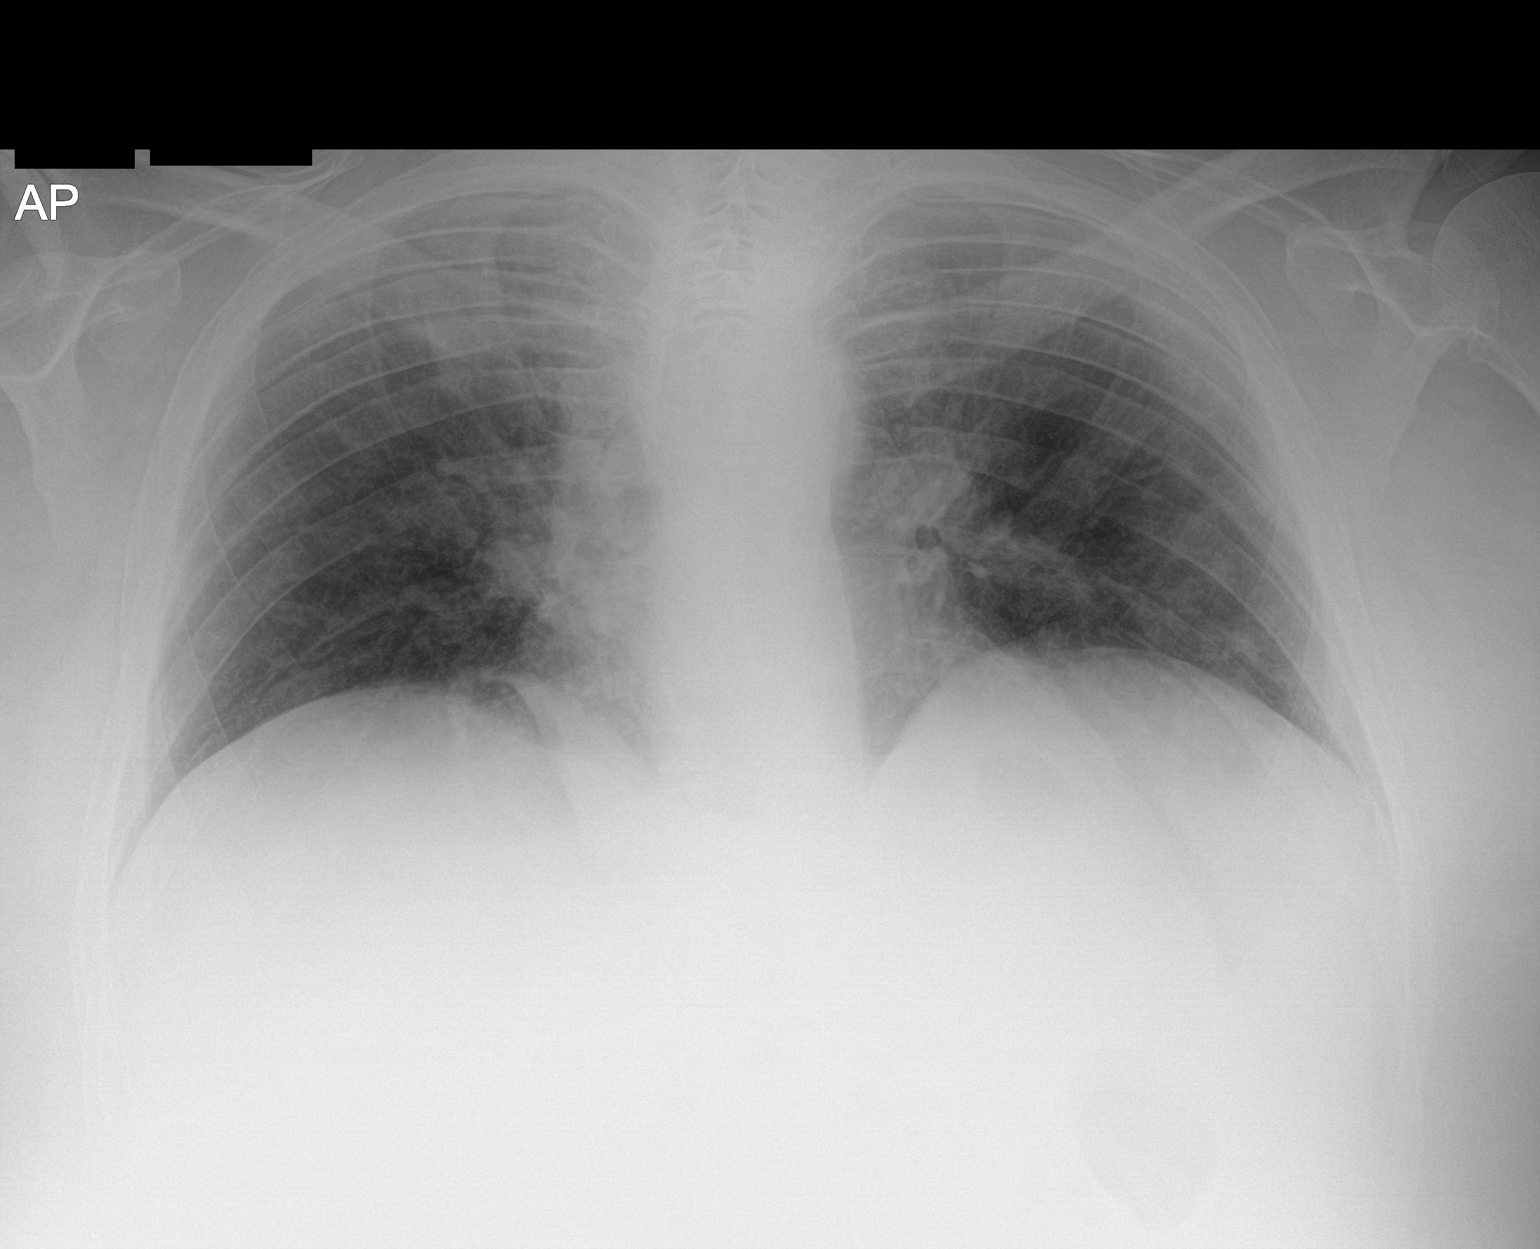

[1 of 1 positions shown; findings below may reference images not displayed]

FINDINGS: Single frontal view of the chest demonstrates unremarkable cardiac
silhouette. The lung volumes are diminished, with patchy bilateral
areas of faint ground-glass airspace disease consistent with
multifocal pneumonia. No effusion or pneumothorax.
IMPRESSION: 1. Patchy bilateral ground-glass airspace disease consistent with
multifocal pneumonia.

## 2019-10-09 MED ORDER — INSULIN REGULAR(HUMAN) IN NACL 100-0.9 UT/100ML-% IV SOLN
INTRAVENOUS | Status: DC
  Administered 2019-10-10: 19 [IU]/h via INTRAVENOUS
  Filled 2019-10-09: qty 100

## 2019-10-09 MED ORDER — SODIUM CHLORIDE 0.9 % IV BOLUS (SEPSIS)
1000.0000 mL | Freq: Once | INTRAVENOUS | Status: AC
  Administered 2019-10-10: 1000 mL via INTRAVENOUS

## 2019-10-09 MED ORDER — SODIUM CHLORIDE 0.9 % IV SOLN
1000.0000 mL | INTRAVENOUS | Status: DC
  Administered 2019-10-10 – 2019-10-12 (×5): 1000 mL via INTRAVENOUS

## 2019-10-09 MED ORDER — DEXTROSE 50 % IV SOLN: 0.0000 mL | INTRAVENOUS | Status: DC | PRN

## 2019-10-09 MED ORDER — DEXTROSE IN LACTATED RINGERS 5 % IV SOLN: INTRAVENOUS | Status: DC

## 2019-10-09 MED ORDER — POTASSIUM CHLORIDE 10 MEQ/100ML IV SOLN
10.0000 meq | Freq: Once | INTRAVENOUS | Status: AC
  Administered 2019-10-10: 10 meq via INTRAVENOUS
  Filled 2019-10-09: qty 100

## 2019-10-09 MED ORDER — ACETAMINOPHEN 325 MG PO TABS
650.0000 mg | ORAL_TABLET | Freq: Once | ORAL | Status: AC
  Administered 2019-10-09: 650 mg via ORAL
  Filled 2019-10-09: qty 2

## 2019-10-09 NOTE — ED Notes (Signed)
EDP at bedside  

## 2019-10-09 NOTE — ED Notes (Signed)
Critical lab results received; Lactic acid 2.2; Dr Charm Barges aware.

## 2019-10-09 NOTE — ED Notes (Signed)
Pt on monitor 

## 2019-10-09 NOTE — ED Notes (Signed)
Critical lab results received; lactic acid 2.4; Dr Charm Barges aware.

## 2019-10-09 NOTE — ED Notes (Signed)
Critical lab result received; Covid positive. Dr Charm Barges aware.

## 2019-10-09 NOTE — ED Triage Notes (Addendum)
Pt with flu sx x 1 week-pt also states he has wound to left foot-to triage in w/c

## 2019-10-09 NOTE — ED Notes (Addendum)
Pt placed on 4LNC; EDP Cardama made aware

## 2019-10-10 DIAGNOSIS — U071 COVID-19: Secondary | ICD-10-CM

## 2019-10-10 LAB — BASIC METABOLIC PANEL
Anion gap: 13 (ref 5–15)
BUN: 17 mg/dL (ref 6–20)
CO2: 25 mmol/L (ref 22–32)
Calcium: 7.7 mg/dL — ABNORMAL LOW (ref 8.9–10.3)
Chloride: 92 mmol/L — ABNORMAL LOW (ref 98–111)
Creatinine, Ser: 0.91 mg/dL (ref 0.61–1.24)
GFR calc Af Amer: >60 mL/min (ref >60)
GFR calc non Af Amer: >60 mL/min (ref >60)
Glucose, Bld: 177 mg/dL — ABNORMAL HIGH (ref 70–99)
Potassium: 3.2 mmol/L — ABNORMAL LOW (ref 3.5–5.1)
Sodium: 130 mmol/L — ABNORMAL LOW (ref 135–145)

## 2019-10-10 LAB — PROCALCITONIN: Procalcitonin: 1.52 ng/mL

## 2019-10-10 LAB — BLOOD CULTURE ID PANEL (REFLEXED) - BCID2
A.calcoaceticus-baumannii: NOT DETECTED
Bacteroides fragilis: NOT DETECTED
Candida albicans: NOT DETECTED
Candida auris: NOT DETECTED
Candida glabrata: NOT DETECTED
Candida krusei: NOT DETECTED
Candida parapsilosis: NOT DETECTED
Candida tropicalis: NOT DETECTED
Cryptococcus neoformans/gattii: NOT DETECTED
Enterobacter cloacae complex: NOT DETECTED
Enterobacterales: NOT DETECTED
Enterococcus Faecium: NOT DETECTED
Enterococcus faecalis: NOT DETECTED
Escherichia coli: NOT DETECTED
Haemophilus influenzae: NOT DETECTED
Klebsiella aerogenes: NOT DETECTED
Klebsiella oxytoca: NOT DETECTED
Klebsiella pneumoniae: NOT DETECTED
Listeria monocytogenes: NOT DETECTED
Meth resistant mecA/C and MREJ: NOT DETECTED
Neisseria meningitidis: NOT DETECTED
Proteus species: NOT DETECTED
Pseudomonas aeruginosa: NOT DETECTED
Salmonella species: NOT DETECTED
Serratia marcescens: NOT DETECTED
Staphylococcus aureus (BCID): DETECTED — AB
Staphylococcus epidermidis: NOT DETECTED
Staphylococcus lugdunensis: NOT DETECTED
Staphylococcus species: DETECTED — AB
Stenotrophomonas maltophilia: NOT DETECTED
Streptococcus agalactiae: DETECTED — AB
Streptococcus pneumoniae: NOT DETECTED
Streptococcus pyogenes: NOT DETECTED
Streptococcus species: DETECTED — AB

## 2019-10-10 LAB — CBG MONITORING, ED
Glucose-Capillary: 163 mg/dL — ABNORMAL HIGH (ref 70–99)
Glucose-Capillary: 168 mg/dL — ABNORMAL HIGH (ref 70–99)
Glucose-Capillary: 213 mg/dL — ABNORMAL HIGH (ref 70–99)
Glucose-Capillary: 236 mg/dL — ABNORMAL HIGH (ref 70–99)
Glucose-Capillary: 249 mg/dL — ABNORMAL HIGH (ref 70–99)
Glucose-Capillary: 251 mg/dL — ABNORMAL HIGH (ref 70–99)
Glucose-Capillary: 287 mg/dL — ABNORMAL HIGH (ref 70–99)
Glucose-Capillary: 380 mg/dL — ABNORMAL HIGH (ref 70–99)
Glucose-Capillary: 436 mg/dL — ABNORMAL HIGH (ref 70–99)

## 2019-10-10 LAB — D-DIMER, QUANTITATIVE: D-Dimer, Quant: 2.92 ug/mL-FEU — ABNORMAL HIGH (ref 0.00–0.50)

## 2019-10-10 LAB — TRIGLYCERIDES: Triglycerides: 120 mg/dL (ref <150)

## 2019-10-10 LAB — LACTIC ACID, PLASMA: Lactic Acid, Venous: 2 mmol/L (ref 0.5–1.9)

## 2019-10-10 LAB — HEMOGLOBIN A1C
Hgb A1c MFr Bld: 11.6 % — ABNORMAL HIGH (ref 4.8–5.6)
Mean Plasma Glucose: 286.22 mg/dL

## 2019-10-10 LAB — C-REACTIVE PROTEIN: CRP: 23.6 mg/dL — ABNORMAL HIGH (ref <1.0)

## 2019-10-10 LAB — LACTATE DEHYDROGENASE: LDH: 337 U/L — ABNORMAL HIGH (ref 98–192)

## 2019-10-10 LAB — FERRITIN: Ferritin: 519 ng/mL — ABNORMAL HIGH (ref 24–336)

## 2019-10-10 LAB — FIBRINOGEN: Fibrinogen: 780 mg/dL — ABNORMAL HIGH (ref 210–475)

## 2019-10-10 LAB — MAGNESIUM: Magnesium: 1 mg/dL — ABNORMAL LOW (ref 1.7–2.4)

## 2019-10-10 MED ORDER — POTASSIUM CHLORIDE IN NACL 20-0.9 MEQ/L-% IV SOLN
Freq: Once | INTRAVENOUS | Status: AC
  Filled 2019-10-10: qty 1000

## 2019-10-10 MED ORDER — MAGNESIUM SULFATE 4 GM/100ML IV SOLN
4.0000 g | INTRAVENOUS | Status: AC
  Administered 2019-10-10 – 2019-10-11 (×2): 4 g via INTRAVENOUS
  Filled 2019-10-10 (×2): qty 100

## 2019-10-10 MED ORDER — LOPERAMIDE HCL 2 MG PO CAPS
4.0000 mg | ORAL_CAPSULE | Freq: Once | ORAL | Status: AC
  Administered 2019-10-10: 4 mg via ORAL
  Filled 2019-10-10: qty 2

## 2019-10-10 MED ORDER — POTASSIUM CHLORIDE CRYS ER 20 MEQ PO TBCR
40.0000 meq | EXTENDED_RELEASE_TABLET | Freq: Once | ORAL | Status: AC
  Administered 2019-10-10: 40 meq via ORAL
  Filled 2019-10-10: qty 2

## 2019-10-10 MED ORDER — ONDANSETRON HCL 4 MG/2ML IJ SOLN
4.0000 mg | Freq: Once | INTRAMUSCULAR | Status: AC
  Administered 2019-10-10: 4 mg via INTRAVENOUS
  Filled 2019-10-10: qty 2

## 2019-10-10 MED ORDER — DEXAMETHASONE SODIUM PHOSPHATE 10 MG/ML IJ SOLN
6.0000 mg | Freq: Once | INTRAMUSCULAR | Status: AC
  Administered 2019-10-10: 6 mg via INTRAVENOUS
  Filled 2019-10-10: qty 1

## 2019-10-10 MED ORDER — SODIUM CHLORIDE 0.9 % IV SOLN
2.0000 g | INTRAVENOUS | Status: DC
  Administered 2019-10-10 – 2019-10-11 (×2): 2 g via INTRAVENOUS
  Filled 2019-10-10 (×2): qty 20

## 2019-10-10 MED ORDER — POTASSIUM CHLORIDE CRYS ER 20 MEQ PO TBCR
EXTENDED_RELEASE_TABLET | ORAL | Status: AC
  Administered 2019-10-10: 40 meq
  Filled 2019-10-10: qty 2

## 2019-10-10 MED ORDER — LACTATED RINGERS IV BOLUS
500.0000 mL | Freq: Once | INTRAVENOUS | Status: AC
  Administered 2019-10-10: 500 mL via INTRAVENOUS

## 2019-10-10 MED ORDER — LACTATED RINGERS IV BOLUS
1000.0000 mL | Freq: Once | INTRAVENOUS | Status: AC
  Administered 2019-10-10: 1000 mL via INTRAVENOUS

## 2019-10-10 MED ORDER — SODIUM CHLORIDE 0.9 % IV SOLN
100.0000 mg | INTRAVENOUS | Status: AC
  Administered 2019-10-10 (×2): 100 mg via INTRAVENOUS
  Filled 2019-10-10 (×2): qty 20

## 2019-10-10 MED ORDER — POTASSIUM CHLORIDE 20 MEQ PO PACK
40.0000 meq | PACK | ORAL | Status: AC
  Filled 2019-10-10: qty 2

## 2019-10-10 MED ORDER — LACTATED RINGERS IV SOLN: INTRAVENOUS | Status: AC

## 2019-10-10 MED ORDER — ACETAMINOPHEN 325 MG PO TABS
650.0000 mg | ORAL_TABLET | Freq: Once | ORAL | Status: AC
  Administered 2019-10-10: 650 mg via ORAL
  Filled 2019-10-10: qty 2

## 2019-10-10 MED ORDER — VANCOMYCIN HCL IN DEXTROSE 1-5 GM/200ML-% IV SOLN
1000.0000 mg | INTRAVENOUS | Status: AC
  Administered 2019-10-10 (×2): 1000 mg via INTRAVENOUS
  Filled 2019-10-10 (×2): qty 200

## 2019-10-10 MED ORDER — VANCOMYCIN HCL IN DEXTROSE 1-5 GM/200ML-% IV SOLN
1000.0000 mg | Freq: Three times a day (TID) | INTRAVENOUS | Status: DC
  Administered 2019-10-11 – 2019-10-12 (×3): 1000 mg via INTRAVENOUS
  Filled 2019-10-10 (×4): qty 200

## 2019-10-10 MED ORDER — SODIUM CHLORIDE 0.9 % IV SOLN
100.0000 mg | Freq: Every day | INTRAVENOUS | Status: AC
  Administered 2019-10-11 – 2019-10-14 (×4): 100 mg via INTRAVENOUS
  Filled 2019-10-10 (×5): qty 20

## 2019-10-10 MED ORDER — INSULIN ASPART 100 UNIT/ML ~~LOC~~ SOLN
0.0000 [IU] | SUBCUTANEOUS | Status: DC
  Administered 2019-10-10: 4 [IU] via SUBCUTANEOUS
  Administered 2019-10-10 (×2): 11 [IU] via SUBCUTANEOUS
  Administered 2019-10-10: 4 [IU] via SUBCUTANEOUS
  Administered 2019-10-10 – 2019-10-11 (×3): 7 [IU] via SUBCUTANEOUS
  Administered 2019-10-11 (×2): 4 [IU] via SUBCUTANEOUS
  Administered 2019-10-11: 7 [IU] via SUBCUTANEOUS
  Administered 2019-10-11: 4 [IU] via SUBCUTANEOUS
  Filled 2019-10-10: qty 7
  Filled 2019-10-10 (×3): qty 4
  Filled 2019-10-10: qty 15
  Filled 2019-10-10 (×2): qty 4
  Filled 2019-10-10: qty 7
  Filled 2019-10-10 (×2): qty 11
  Filled 2019-10-10: qty 7

## 2019-10-10 NOTE — ED Notes (Signed)
PT given 2 Entrees and multiple snacks, pt again requesting bedside commode. PT placed on bedside commode and bedside table, floor, commode, and bed cleaned for pt and cords untangled from iv lines.

## 2019-10-10 NOTE — ED Notes (Signed)
CBG entry for 0616 was for earlier, the glucometer needed new batteries and therefore just loaded previous data

## 2019-10-10 NOTE — Progress Notes (Signed)
Pharmacy Antibiotic Note  Micheal Bell is a 49 y.o. male admitted on 10/09/2019 with pneumonia.  Pharmacy has been consulted for vancomycin dosing. Pt is febrile with Tmax 102.9 and WBC is WNL. Lactic acid is elevated.   Plan: Vancomycin 2gm (1gm + 1gm) then 1gm IV Q8H F/u renal fxn, C&S, clinical status and trough at SS F/u de-escalation    Temp (24hrs), Avg:99.7 F (37.6 C), Min:99.2 F (37.3 C), Max:100.5 F (38.1 C)  Recent Labs  Lab 10/09/19 1900 10/09/19 2200 10/10/19 1411  WBC 9.1  --   --   CREATININE 1.04  --  0.91  LATICACIDVEN 2.4* 2.2*  --     CrCl cannot be calculated (Unknown ideal weight.).    Allergies  Allergen Reactions  . Azithromycin   . Lodine [Etodolac] Anaphylaxis    Thank you for allowing pharmacy to be a part of this patient's care.  Tayven Renteria, Drake Leach 10/10/2019 10:09 PM

## 2019-10-10 NOTE — ED Notes (Signed)
Diarrhea seems to be less often, pt still complains of feeling tired and weak, cont to await for room assignment in the [facility] system

## 2019-10-10 NOTE — ED Provider Notes (Signed)
[facility] EMERGENCY DEPARTMENT Provider Note  CSN: [id number] Arrival date & time: 10/09/19 1830  Chief Complaint(s) Cough  HPI Micheal Bell is a 49 y.o. male with a history of type 2 diabetes who presents to the emergency department with 1 week of cough, congestion, myalgias and now worsening shortness of breath with exertion.  Improved with rest.  Patient also reports decreased appetite and oral intake.  Has not taken his medications for 1 to 2 days.  Endorses nausea without emesis.  No abdominal pain.  No diarrhea.  No urinary symptoms.  HPI  Past Medical History Past Medical History:  Diagnosis Date  . Arthritis   . Asthma   . CHF (congestive heart failure) (HCC)   . Diabetes mellitus without complication (HCC)   . Fibromyalgia   . Gout   . Hypertension   . IBS (irritable bowel syndrome)   . Vein disorder    Patient Active Problem List   Diagnosis Date Noted  . COVID-19 virus infection 10/10/2019  . Benign hypertension 07/23/2015  . Gastro-esophageal reflux disease with esophagitis 07/23/2015  . Gout 07/23/2015  . Obstructive sleep apnea 07/23/2015  . Snoring 07/23/2015  . Chronic idiopathic gout of multiple sites 06/02/2015  . Class 2 obesity in adult 06/02/2015  . Fibromyalgia 06/02/2015  . Irritable bowel syndrome with diarrhea 06/02/2015  . Mild persistent asthma 06/02/2015  . Reaction, adjustment, with depressed mood, prolonged 06/02/2015  . Type 2 diabetes mellitus without complication, without long-term current use of insulin (HCC) 06/02/2015   Home Medication(s) Prior to Admission medications   Medication Sig Start Date End Date Taking? Authorizing Provider  albuterol (PROVENTIL HFA;VENTOLIN HFA) 108 (90 Base) MCG/ACT inhaler Inhale into the lungs every 6 (six) hours as needed for wheezing or shortness of breath.    [provider]  allopurinol (ZYLOPRIM) 300 MG tablet Take 300 mg by mouth daily.    [provider]  buPROPion  (WELLBUTRIN XL) 150 MG 24 hr tablet Take 150 mg by mouth every morning. 09/08/19   [provider]  chlorthalidone (HYGROTON) 25 MG tablet Take 25 mg by mouth daily.    [provider]  citalopram (CELEXA) 40 MG tablet Take 40 mg by mouth daily.    [provider]  cyclobenzaprine (FLEXERIL) 10 MG tablet Take 10 mg by mouth 3 (three) times daily as needed for muscle spasms.    [provider]  Fluticasone Propionate, Inhal, (FLOVENT IN) Inhale into the lungs.    [provider]  furosemide (LASIX) 20 MG tablet Take 20 mg by mouth.    [provider]  glipiZIDE (GLUCOTROL) 10 MG tablet Take 10 mg by mouth daily before breakfast.    [provider]  HYDROcodone-acetaminophen (NORCO/VICODIN) 5-325 MG tablet Take 1 tablet by mouth every 6 (six) hours as needed for moderate pain.    [provider]  ibuprofen (ADVIL) 600 MG tablet Take 600 mg by mouth every 6 (six) hours as needed. 03/21/19   [provider]  lidocaine (LIDODERM) 5 % Place 1 patch onto the skin daily. Remove & Discard patch within 12 hours or as directed by MD 05/12/17   Rise Mu, PA-C  meloxicam (MOBIC) 15 MG tablet Take 15 mg by mouth daily.    [provider]  mometasone-formoterol (DULERA) 100-5 MCG/ACT AERO Inhale 2 puffs into the lungs 2 (two) times daily.    [provider]  montelukast (SINGULAIR) 10 MG tablet Take 10 mg by mouth  at bedtime.    [provider]  nitroGLYCERIN (NITRODUR - DOSED IN MG/24 HR) 0.2 mg/hr patch Place 0.2 mg onto the skin daily.    [provider]  nortriptyline (PAMELOR) 25 MG capsule Take 25 mg by mouth at bedtime.    [provider]  pantoprazole (PROTONIX) 40 MG tablet Take 40 mg by mouth daily.    [provider]  pioglitazone (ACTOS) 15 MG tablet Take 15 mg by mouth daily. 08/11/19   [provider]  predniSONE (DELTASONE) 10 MG tablet Take 10 mg by  mouth daily with breakfast.    [provider]  traMADol (ULTRAM) 50 MG tablet Take 1 tablet (50 mg total) by mouth every 6 (six) hours as needed. 03/16/17   Rolland Porter, MD  verapamil (CALAN-SR) 240 MG CR tablet Take 240 mg by mouth at bedtime.    [provider]  zolpidem (AMBIEN) 10 MG tablet Take 10 mg by mouth at bedtime as needed for sleep.    [provider]                                                                                                                                    Past Surgical History Past Surgical History:  Procedure Laterality Date  . FOOT SURGERY     Family History No family history on file.  Social History Social History   Tobacco Use  . Smoking status: Never Smoker  . Smokeless tobacco: Never Used  Vaping Use  . Vaping Use: Never used  Substance Use Topics  . Alcohol use: Never  . Drug use: Never   Allergies Azithromycin and Lodine [etodolac]  Review of Systems Review of Systems All other systems are reviewed and are negative for acute change except as noted in the HPI  Physical Exam Vital Signs  I have reviewed the triage vital signs BP (!) 147/68   Pulse (!) 102   Temp 99.2 F (37.3 C) (Oral)   Resp (!) 21   SpO2 96%   Physical Exam Vitals reviewed.  Constitutional:      General: He is not in acute distress.    Appearance: He is well-developed. He is not diaphoretic.  HENT:     Head: Normocephalic and atraumatic.     Nose: Nose normal.  Eyes:     General: No scleral icterus.       Right eye: No discharge.        Left eye: No discharge.     Conjunctiva/sclera: Conjunctivae normal.     Pupils: Pupils are equal, round, and reactive to light.  Cardiovascular:     Rate and Rhythm: Regular rhythm. Tachycardia present.     Heart sounds: No murmur heard.  No friction rub. No gallop.   Pulmonary:     Effort: Pulmonary effort is normal. Tachypnea present. No respiratory distress.     Breath sounds: No  stridor. Examination  of the right-middle field reveals rales. Examination of the left-middle field reveals rales. Examination of the right-lower field reveals rales. Examination of the left-lower field reveals rales. Rales (faint) present.  Abdominal:     General: There is no distension.     Palpations: Abdomen is soft.     Tenderness: There is no abdominal tenderness.  Musculoskeletal:        General: No tenderness.     Cervical back: Normal range of motion and neck supple.  Skin:    General: Skin is warm and dry.     Findings: No erythema or rash.  Neurological:     Mental Status: He is alert and oriented to person, place, and time.     ED Results and Treatments Labs (all labs ordered are listed, but only abnormal results are displayed) Labs Reviewed  SARS CORONAVIRUS 2 BY RT PCR (HOSPITAL ORDER, PERFORMED IN [facility]) - Abnormal; Notable for the following components:      Result Value   SARS Coronavirus 2 POSITIVE (*)    All other components within normal limits  LACTIC ACID, PLASMA - Abnormal; Notable for the following components:   Lactic Acid, Venous 2.4 (*)    All other components within normal limits  LACTIC ACID, PLASMA - Abnormal; Notable for the following components:   Lactic Acid, Venous 2.2 (*)    All other components within normal limits  COMPREHENSIVE METABOLIC PANEL - Abnormal; Notable for the following components:   Sodium 125 (*)    Potassium 2.9 (*)    Chloride 82 (*)    Glucose, Bld 464 (*)    BUN 23 (*)    Calcium 8.6 (*)    AST 47 (*)    Anion gap 16 (*)    All other components within normal limits  CBC WITH DIFFERENTIAL/PLATELET - Abnormal; Notable for the following components:   Platelets 139 (*)    Neutro Abs 8.1 (*)    All other components within normal limits  URINALYSIS, ROUTINE W REFLEX MICROSCOPIC - Abnormal; Notable for the following components:   Glucose, UA >=500 (*)    Hgb urine dipstick SMALL (*)    Ketones, ur 15 (*)     Protein, ur 100 (*)    Nitrite POSITIVE (*)    All other components within normal limits  URINALYSIS, MICROSCOPIC (REFLEX) - Abnormal; Notable for the following components:   Bacteria, UA FEW (*)    All other components within normal limits  D-DIMER, QUANTITATIVE (NOT AT [facility]) - Abnormal; Notable for the following components:   D-Dimer, Quant 2.92 (*)    All other components within normal limits  CBG MONITORING, ED - Abnormal; Notable for the following components:   Glucose-Capillary 436 (*)    All other components within normal limits  CBG MONITORING, ED - Abnormal; Notable for the following components:   Glucose-Capillary 380 (*)    All other components within normal limits  CBG MONITORING, ED - Abnormal; Notable for the following components:   Glucose-Capillary 287 (*)    All other components within normal limits  CULTURE, BLOOD (SINGLE)  URINE CULTURE  PROCALCITONIN  LACTATE DEHYDROGENASE  FERRITIN  TRIGLYCERIDES  FIBRINOGEN  C-REACTIVE PROTEIN  HEMOGLOBIN A1C  EKG  EKG Interpretation  Date/Time:    Ventricular Rate:    PR Interval:    QRS Duration:   QT Interval:    QTC Calculation:   R Axis:     Text Interpretation:        Radiology DG Chest Portable 1 View  Result Date: 10/09/2019 CLINICAL DATA:  Cough, fever, shortness of breath EXAM: PORTABLE CHEST 1 VIEW COMPARISON:  None. FINDINGS: Single frontal view of the chest demonstrates unremarkable cardiac silhouette. The lung volumes are diminished, with patchy bilateral areas of faint ground-glass airspace disease consistent with multifocal pneumonia. No effusion or pneumothorax. IMPRESSION: 1. Patchy bilateral ground-glass airspace disease consistent with multifocal pneumonia. Electronically Signed   By: Sharlet Salina M.D.   On: 10/09/2019 19:32    Pertinent labs & imaging results that were  available during my care of the patient were reviewed by me and considered in my medical decision making (see chart for details).  Medications Ordered in ED Medications  sodium chloride 0.9 % bolus 1,000 mL (0 mLs Intravenous Stopped 10/10/19 0126)    Followed by  0.9 %  sodium chloride infusion (1,000 mLs Intravenous New Bag/Given 10/10/19 0127)  dextrose 5 % in lactated ringers infusion ( Intravenous Not Given 10/10/19 0217)  dextrose 50 % solution 0-50 mL (has no administration in time range)  remdesivir 100 mg in sodium chloride 0.9 % 100 mL IVPB (100 mg Intravenous New Bag/Given 10/10/19 0215)    Followed by  remdesivir 100 mg in sodium chloride 0.9 % 100 mL IVPB (has no administration in time range)  potassium chloride (KLOR-CON) packet 40 mEq (40 mEq Oral Not Given 10/10/19 0216)  insulin aspart (novoLOG) injection 0-20 Units (11 Units Subcutaneous Given 10/10/19 0210)  acetaminophen (TYLENOL) tablet 650 mg (650 mg Oral Given 10/09/19 1902)  potassium chloride 10 mEq in 100 mL IVPB (0 mEq Intravenous Stopped 10/10/19 0126)  0.9 % NaCl with KCl 20 mEq/ L  infusion ( Intravenous New Bag/Given 10/10/19 0216)  lactated ringers bolus 1,000 mL (1,000 mLs Intravenous New Bag/Given 10/10/19 0222)  potassium chloride SA (KLOR-CON) 20 MEQ CR tablet (40 mEq  Given 10/10/19 0216)                                                                                                                                    Procedures .Critical Care Performed by: Nira Conn, MD Authorized by: Nira Conn, MD     CRITICAL CARE Performed by: Amadeo Garnet Ramiel Forti Total critical care time: 40 minutes Critical care time was exclusive of separately billable procedures and treating other patients. Critical care was necessary to treat or prevent imminent or life-threatening deterioration. Critical care was time spent personally by me on the following activities: development of treatment plan with  patient and/or surrogate as well as nursing, discussions with consultants, evaluation of patient's response to treatment, examination of patient, obtaining history from patient  or surrogate, ordering and performing treatments and interventions, ordering and review of laboratory studies, ordering and review of radiographic studies, pulse oximetry and re-evaluation of patient's condition.   (including critical care time)  Medical Decision Making / ED Course I have reviewed the nursing notes for this encounter and the patient's prior records (if available in EHR or on provided paperwork).   Micheal Bell was evaluated in Emergency Department on 10/10/2019 for the symptoms described in the history of present illness. He was evaluated in the context of the global COVID-19 pandemic, which necessitated consideration that the patient might be at risk for infection with the SARS-CoV-2 virus that causes COVID-19. Institutional protocols and algorithms that pertain to the evaluation of patients at risk for COVID-19 are in a state of rapid change based on information released by regulatory bodies including the CDC and federal and state organizations. These policies and algorithms were followed during the patient's care in the ED.  Patient is Covid positive with chest x-ray consistent with Covid.  He is satting 86% on room air at rest.  Placed on 4 L nasal cannula which improved his saturations and work of breathing.  Labs also notable for hyperglycemia but not consistent with of DKA.   Patient given IV fluids and placed on insulin drip.  Remdesivir and steroids ordered.  Patient admitted to medicine for further work-up and management.      Final Clinical Impression(s) / ED Diagnoses Final diagnoses:  Lower respiratory tract infection due to COVID-19 virus  Hypoxia  Hyperglycemia      This chart was dictated using voice recognition software.  Despite best efforts to proofread,  errors can occur  which can change the documentation meaning.   Nira Conn, MD 10/10/19 [phone]

## 2019-10-10 NOTE — ED Notes (Signed)
Cont to have diarrhea, order rec for Immodium 4mg  PO

## 2019-10-10 NOTE — ED Notes (Signed)
Attempted to call family to give update, no answer

## 2019-10-10 NOTE — Significant Event (Addendum)
HOSPITAL MEDICINE OVERNIGHT EVENT NOTE    Called by charge nurse at [facility] concerning multiple medical issues with the patient seeing as how she has been awaiting a bed at either [facility] or [facility] for over 24 hours.  Blood cultures have come back positive for both Streptococcus and Staphylococcus.  Considering bilateral infiltrates on chest x-ray which were initially thought to be solely due to Covid, I will also add intravenous antibacterial coverage with vancomycin and ceftriaxone per the pneumonia order set.  Additionally, after reviewing the chart it seems the patient presents today lactic acidosis as of 10 PM last night however patient has not had a repeat lactate level since then.  I will order stat lactic acid level now.  Furthermore, patient is found to have substantial hypomagnesemia of 1.0 in addition to concurrent hypokalemia.  Ordered 4 g of intravenous magnesium sulfate as well as 40 mEq of oral potassium.  Will follow, bring to an inpatient bed as soon as available.   Marinda Elk  MD [facility]   ADDENDUM  Patient's lactate is 2.0, will order 500cc LR bolus followed by 125cc/hr.    Deno Lunger Daysen Gundrum

## 2019-10-10 NOTE — ED Notes (Signed)
Patient rounding placed on 10L HFNC due to increase in RR. Patient oxygen saturations of 92% on 10L HFNC @ 100% FIO2, HR 110, RR 30. Will continue to monitor and make changes as necessary. Patient on monitor. Patient tolerated well.

## 2019-10-10 NOTE — ED Notes (Signed)
Admitting provider discontinued insulin drip; this RN confirmed with EDP and Charge RN and then gave subQ insulin and discontinued insulin drip; pt aware of changes; will recheck BS in an hour

## 2019-10-10 NOTE — ED Notes (Signed)
Blood cultures reported to Dr Leafy Half  Orders received

## 2019-10-10 NOTE — ED Notes (Signed)
Current pt status: Pt alert and oriented x 4, GCS is 15, HR 102/min, NBP 156/85 by cuff of left arm, POX 90% on 8lpm via [place], pt noted to become even more tachypnea at times when moving around in bed and talking a lot, skin is warm and dry, c/o body aches, has completed Rocephin IVPB, 1st 1gm of Vanco, is now currently rec Mag gtt, LR bolus and 2 1gm IVPB of Vanco. Cont to await for room assignment, no further diarrhea is noted since last episode earlier this afternoon, at approx 1400hrs of sm amt of liquid light brown stool

## 2019-10-10 NOTE — ED Notes (Signed)
Spoke to pt sister and mother per pt request; gave following contact info and request update; will relay to oncoming RN Harriett Sine (mother): [phone]

## 2019-10-10 NOTE — ED Notes (Signed)
Pt desat to high 80s; Pt placed on HFNC at 8L per RT; EDP Adela Lank made aware

## 2019-10-11 LAB — CBG MONITORING, ED
Glucose-Capillary: 241 mg/dL — ABNORMAL HIGH (ref 70–99)
Glucose-Capillary: 208 mg/dL — ABNORMAL HIGH (ref 70–99)
Glucose-Capillary: 203 mg/dL — ABNORMAL HIGH (ref 70–99)
Glucose-Capillary: 192 mg/dL — ABNORMAL HIGH (ref 70–99)
Glucose-Capillary: 194 mg/dL — ABNORMAL HIGH (ref 70–99)
Glucose-Capillary: 192 mg/dL — ABNORMAL HIGH (ref 70–99)

## 2019-10-11 LAB — HIV ANTIBODY (ROUTINE TESTING W REFLEX): HIV Screen 4th Generation wRfx: NONREACTIVE

## 2019-10-11 LAB — LACTIC ACID, PLASMA: Lactic Acid, Venous: 2 mmol/L (ref 0.5–1.9)

## 2019-10-11 MED ORDER — ACETAMINOPHEN 325 MG PO TABS: 650.0000 mg | ORAL_TABLET | Freq: Once | ORAL | Status: AC

## 2019-10-11 MED ORDER — VERAPAMIL HCL ER 240 MG PO TBCR
240.0000 mg | EXTENDED_RELEASE_TABLET | Freq: Every day | ORAL | Status: DC
  Administered 2019-10-11: 240 mg via ORAL
  Filled 2019-10-11 (×2): qty 1

## 2019-10-11 MED ORDER — ACETAMINOPHEN 325 MG PO TABS
ORAL_TABLET | ORAL | Status: AC
  Administered 2019-10-11: 650 mg via ORAL
  Filled 2019-10-11: qty 2

## 2019-10-11 MED ORDER — HYDROCODONE-ACETAMINOPHEN 5-325 MG PO TABS
1.0000 | ORAL_TABLET | Freq: Four times a day (QID) | ORAL | Status: DC | PRN
  Administered 2019-10-11 (×3): 1 via ORAL
  Filled 2019-10-11 (×3): qty 1

## 2019-10-11 MED ORDER — NORTRIPTYLINE HCL 25 MG PO CAPS
25.0000 mg | ORAL_CAPSULE | Freq: Every day | ORAL | Status: DC
  Administered 2019-10-11: 25 mg via ORAL
  Filled 2019-10-11 (×2): qty 1

## 2019-10-11 MED ORDER — CYCLOBENZAPRINE HCL 10 MG PO TABS
10.0000 mg | ORAL_TABLET | Freq: Three times a day (TID) | ORAL | Status: DC | PRN
  Administered 2019-10-11 (×2): 10 mg via ORAL
  Filled 2019-10-11 (×2): qty 1

## 2019-10-11 NOTE — ED Notes (Signed)
Dr. Read Drivers in to see patient. He c/o lower back pain. Pain medications ordered by MD

## 2019-10-11 NOTE — ED Notes (Signed)
BP elevated, EDP notified

## 2019-10-11 NOTE — ED Notes (Signed)
Patient oxygen saturations continued to maintain @ 88-89% with occasional decreasing to 83%. Patient placed on additional 15L NRB to maintain oxygen saturations. Patient on monitor and will continue to monitor and make necessary adjustments as needed to maintain oxygen saturations. Patient tolerated well.

## 2019-10-11 NOTE — ED Notes (Addendum)
Pt with dime size circular open area to the posterior aspect of his left foot. States that he is currently being seen by a podiatrist for this. Wound cleaned. Non adherent dressing placed.

## 2019-10-11 NOTE — ED Notes (Signed)
[facility] pharm called for pt BP meds, dash notified. Meds in route to [facility]

## 2019-10-11 NOTE — Progress Notes (Signed)
Patient is on 15 liter Hi Flo nasal cannula and 100% non re-breather with a SPO2 of 95% and a respiratory rate of 18.  RT will continue to monitor.

## 2019-10-11 NOTE — ED Notes (Signed)
Patients' RR 30, oxygen saturations 89% increased HFNC to 15L/100% FIO2. Patient educated on a flutter valve. Patient demonstrated properly and able to expectorate afterwards. Patient on monitor and will continue to monitor and make changes as necessary. Patient tolerating well.

## 2019-10-11 NOTE — ED Notes (Signed)
Dr. Read Drivers aware of lactic of 2.0

## 2019-10-11 NOTE — ED Notes (Signed)
Checked CBG 208 RN Chanin informed

## 2019-10-11 NOTE — ED Provider Notes (Addendum)
Patient being admitted for Covid.  He has been on scheduled vancomycin for pneumonia as well as remdesivir for Covid..  Blood cultures grew out both staph and strep; vancomycin should cover these, sensitivity is pending.  The patient is in no distress but tells me he is having pain in his back and shoulders.  He is on hydrocodone at home (chronic pain management, receiving 120, hydrocodone/APAP 5/325 tablets monthly) as well as Flexeril, but these had not been ordered here.  Orders for these placed.    Kalliope Riesen, Jonny Ruiz, MD 10/11/19 0622    Paula Libra, MD 10/11/19 [phone]

## 2019-10-11 NOTE — ED Notes (Addendum)
Report to next shift

## 2019-10-11 NOTE — ED Notes (Signed)
02 sats 88% on RA non rebreather placed at 15L and 02 sats increased to 97%

## 2019-10-12 ENCOUNTER — Inpatient Hospital Stay (HOSPITAL_COMMUNITY): Payer: Medicaid Other

## 2019-10-12 ENCOUNTER — Inpatient Hospital Stay: Payer: Self-pay

## 2019-10-12 DIAGNOSIS — E872 Acidosis: Secondary | ICD-10-CM | POA: Diagnosis not present

## 2019-10-12 DIAGNOSIS — E118 Type 2 diabetes mellitus with unspecified complications: Secondary | ICD-10-CM | POA: Diagnosis not present

## 2019-10-12 DIAGNOSIS — E870 Hyperosmolality and hypernatremia: Secondary | ICD-10-CM | POA: Diagnosis not present

## 2019-10-12 DIAGNOSIS — B958 Unspecified staphylococcus as the cause of diseases classified elsewhere: Secondary | ICD-10-CM | POA: Diagnosis present

## 2019-10-12 DIAGNOSIS — R7881 Bacteremia: Secondary | ICD-10-CM | POA: Diagnosis not present

## 2019-10-12 DIAGNOSIS — J9601 Acute respiratory failure with hypoxia: Secondary | ICD-10-CM | POA: Diagnosis not present

## 2019-10-12 DIAGNOSIS — I82431 Acute embolism and thrombosis of right popliteal vein: Secondary | ICD-10-CM | POA: Diagnosis not present

## 2019-10-12 DIAGNOSIS — M869 Osteomyelitis, unspecified: Secondary | ICD-10-CM | POA: Diagnosis not present

## 2019-10-12 DIAGNOSIS — I5021 Acute systolic (congestive) heart failure: Secondary | ICD-10-CM | POA: Diagnosis not present

## 2019-10-12 DIAGNOSIS — B9561 Methicillin susceptible Staphylococcus aureus infection as the cause of diseases classified elsewhere: Secondary | ICD-10-CM | POA: Diagnosis not present

## 2019-10-12 DIAGNOSIS — E876 Hypokalemia: Secondary | ICD-10-CM | POA: Diagnosis not present

## 2019-10-12 DIAGNOSIS — R569 Unspecified convulsions: Secondary | ICD-10-CM | POA: Diagnosis not present

## 2019-10-12 DIAGNOSIS — E785 Hyperlipidemia, unspecified: Secondary | ICD-10-CM | POA: Diagnosis present

## 2019-10-12 DIAGNOSIS — R0902 Hypoxemia: Secondary | ICD-10-CM

## 2019-10-12 DIAGNOSIS — E1140 Type 2 diabetes mellitus with diabetic neuropathy, unspecified: Secondary | ICD-10-CM | POA: Diagnosis present

## 2019-10-12 DIAGNOSIS — L0390 Cellulitis, unspecified: Secondary | ICD-10-CM | POA: Diagnosis not present

## 2019-10-12 DIAGNOSIS — J45909 Unspecified asthma, uncomplicated: Secondary | ICD-10-CM | POA: Diagnosis not present

## 2019-10-12 DIAGNOSIS — I081 Rheumatic disorders of both mitral and tricuspid valves: Secondary | ICD-10-CM | POA: Diagnosis not present

## 2019-10-12 DIAGNOSIS — G92 Toxic encephalopathy: Secondary | ICD-10-CM | POA: Diagnosis not present

## 2019-10-12 DIAGNOSIS — A4189 Other specified sepsis: Secondary | ICD-10-CM | POA: Diagnosis not present

## 2019-10-12 DIAGNOSIS — U071 COVID-19: Secondary | ICD-10-CM | POA: Diagnosis not present

## 2019-10-12 DIAGNOSIS — I2510 Atherosclerotic heart disease of native coronary artery without angina pectoris: Secondary | ICD-10-CM | POA: Diagnosis present

## 2019-10-12 DIAGNOSIS — G8929 Other chronic pain: Secondary | ICD-10-CM | POA: Diagnosis present

## 2019-10-12 DIAGNOSIS — E1165 Type 2 diabetes mellitus with hyperglycemia: Secondary | ICD-10-CM | POA: Diagnosis present

## 2019-10-12 DIAGNOSIS — I110 Hypertensive heart disease with heart failure: Secondary | ICD-10-CM | POA: Diagnosis not present

## 2019-10-12 DIAGNOSIS — R739 Hyperglycemia, unspecified: Secondary | ICD-10-CM | POA: Diagnosis not present

## 2019-10-12 DIAGNOSIS — N390 Urinary tract infection, site not specified: Secondary | ICD-10-CM | POA: Diagnosis not present

## 2019-10-12 DIAGNOSIS — I749 Embolism and thrombosis of unspecified artery: Secondary | ICD-10-CM | POA: Diagnosis not present

## 2019-10-12 DIAGNOSIS — J1282 Pneumonia due to coronavirus disease 2019: Secondary | ICD-10-CM | POA: Diagnosis not present

## 2019-10-12 DIAGNOSIS — E871 Hypo-osmolality and hyponatremia: Secondary | ICD-10-CM | POA: Diagnosis not present

## 2019-10-12 DIAGNOSIS — L02612 Cutaneous abscess of left foot: Secondary | ICD-10-CM | POA: Diagnosis not present

## 2019-10-12 DIAGNOSIS — I5023 Acute on chronic systolic (congestive) heart failure: Secondary | ICD-10-CM | POA: Diagnosis not present

## 2019-10-12 DIAGNOSIS — G4733 Obstructive sleep apnea (adult) (pediatric): Secondary | ICD-10-CM | POA: Diagnosis not present

## 2019-10-12 DIAGNOSIS — I639 Cerebral infarction, unspecified: Secondary | ICD-10-CM | POA: Diagnosis not present

## 2019-10-12 LAB — LACTIC ACID, PLASMA
Lactic Acid, Venous: 2.1 mmol/L (ref 0.5–1.9)
Lactic Acid, Venous: 1.7 mmol/L (ref 0.5–1.9)

## 2019-10-12 LAB — POCT I-STAT 7, (LYTES, BLD GAS, ICA,H+H)
Acid-Base Excess: 1 mmol/L (ref 0.0–2.0)
Acid-Base Excess: 3 mmol/L — ABNORMAL HIGH (ref 0.0–2.0)
Acid-base deficit: 2 mmol/L (ref 0.0–2.0)
Bicarbonate: 24.7 mmol/L (ref 20.0–28.0)
Bicarbonate: 26 mmol/L (ref 20.0–28.0)
Bicarbonate: 26.2 mmol/L (ref 20.0–28.0)
Calcium, Ion: 1.08 mmol/L — ABNORMAL LOW (ref 1.15–1.40)
Calcium, Ion: 1.08 mmol/L — ABNORMAL LOW (ref 1.15–1.40)
Calcium, Ion: 1.1 mmol/L — ABNORMAL LOW (ref 1.15–1.40)
HCT: 37 % — ABNORMAL LOW (ref 39.0–52.0)
HCT: 38 % — ABNORMAL LOW (ref 39.0–52.0)
HCT: 42 % (ref 39.0–52.0)
Hemoglobin: 12.6 g/dL — ABNORMAL LOW (ref 13.0–17.0)
Hemoglobin: 12.9 g/dL — ABNORMAL LOW (ref 13.0–17.0)
Hemoglobin: 14.3 g/dL (ref 13.0–17.0)
O2 Saturation: 86 %
O2 Saturation: 88 %
O2 Saturation: 90 %
Patient temperature: 102
Patient temperature: 102
Patient temperature: 99.4
Potassium: 3.2 mmol/L — ABNORMAL LOW (ref 3.5–5.1)
Potassium: 3 mmol/L — ABNORMAL LOW (ref 3.5–5.1)
Potassium: 3.4 mmol/L — ABNORMAL LOW (ref 3.5–5.1)
Sodium: 129 mmol/L — ABNORMAL LOW (ref 135–145)
Sodium: 128 mmol/L — ABNORMAL LOW (ref 135–145)
Sodium: 129 mmol/L — ABNORMAL LOW (ref 135–145)
TCO2: 26 mmol/L (ref 22–32)
TCO2: 27 mmol/L (ref 22–32)
TCO2: 28 mmol/L (ref 22–32)
pCO2 arterial: 36.6 mmHg (ref 32.0–48.0)
pCO2 arterial: 36.1 mmHg (ref 32.0–48.0)
pCO2 arterial: 57.6 mmHg — ABNORMAL HIGH (ref 32.0–48.0)
pH, Arterial: 7.445 (ref 7.350–7.450)
pH, Arterial: 7.472 — ABNORMAL HIGH (ref 7.350–7.450)
pH, Arterial: 7.268 — ABNORMAL LOW (ref 7.350–7.450)
pO2, Arterial: 54 mmHg — ABNORMAL LOW (ref 83.0–108.0)
pO2, Arterial: 55 mmHg — ABNORMAL LOW (ref 83.0–108.0)
pO2, Arterial: 71 mmHg — ABNORMAL LOW (ref 83.0–108.0)

## 2019-10-12 LAB — COMPREHENSIVE METABOLIC PANEL
ALT: 36 U/L (ref 0–44)
AST: 67 U/L — ABNORMAL HIGH (ref 15–41)
Albumin: 2.3 g/dL — ABNORMAL LOW (ref 3.5–5.0)
Alkaline Phosphatase: 115 U/L (ref 38–126)
Anion gap: 17 — ABNORMAL HIGH (ref 5–15)
BUN: 10 mg/dL (ref 6–20)
CO2: 24 mmol/L (ref 22–32)
Calcium: 8.4 mg/dL — ABNORMAL LOW (ref 8.9–10.3)
Chloride: 89 mmol/L — ABNORMAL LOW (ref 98–111)
Creatinine, Ser: 0.91 mg/dL (ref 0.61–1.24)
GFR calc Af Amer: >60 mL/min (ref >60)
GFR calc non Af Amer: >60 mL/min (ref >60)
Glucose, Bld: 253 mg/dL — ABNORMAL HIGH (ref 70–99)
Potassium: 3.1 mmol/L — ABNORMAL LOW (ref 3.5–5.1)
Sodium: 130 mmol/L — ABNORMAL LOW (ref 135–145)
Total Bilirubin: 1.3 mg/dL — ABNORMAL HIGH (ref 0.3–1.2)
Total Protein: 6.7 g/dL (ref 6.5–8.1)

## 2019-10-12 LAB — GLUCOSE, CAPILLARY
Glucose-Capillary: 188 mg/dL — ABNORMAL HIGH (ref 70–99)
Glucose-Capillary: 219 mg/dL — ABNORMAL HIGH (ref 70–99)
Glucose-Capillary: 298 mg/dL — ABNORMAL HIGH (ref 70–99)
Glucose-Capillary: 374 mg/dL — ABNORMAL HIGH (ref 70–99)
Glucose-Capillary: 441 mg/dL — ABNORMAL HIGH (ref 70–99)
Glucose-Capillary: 389 mg/dL — ABNORMAL HIGH (ref 70–99)
Glucose-Capillary: 338 mg/dL — ABNORMAL HIGH (ref 70–99)
Glucose-Capillary: 355 mg/dL — ABNORMAL HIGH (ref 70–99)
Glucose-Capillary: 319 mg/dL — ABNORMAL HIGH (ref 70–99)
Glucose-Capillary: 230 mg/dL — ABNORMAL HIGH (ref 70–99)
Glucose-Capillary: 260 mg/dL — ABNORMAL HIGH (ref 70–99)

## 2019-10-12 LAB — ECHOCARDIOGRAM COMPLETE
Area-P 1/2: 2.34 cm2
Height: 74 in
S' Lateral: 3.9 cm
Weight: 3920 oz

## 2019-10-12 LAB — C-REACTIVE PROTEIN: CRP: 34.4 mg/dL — ABNORMAL HIGH (ref <1.0)

## 2019-10-12 LAB — CBC WITH DIFFERENTIAL/PLATELET
Abs Immature Granulocytes: 0.18 10*3/uL — ABNORMAL HIGH (ref 0.00–0.07)
Basophils Absolute: 0.1 10*3/uL (ref 0.0–0.1)
Basophils Relative: 1 %
Eosinophils Absolute: 0 10*3/uL (ref 0.0–0.5)
Eosinophils Relative: 0 %
HCT: 43.2 % (ref 39.0–52.0)
Hemoglobin: 15.1 g/dL (ref 13.0–17.0)
Immature Granulocytes: 1 %
Lymphocytes Relative: 4 %
Lymphs Abs: 0.6 10*3/uL — ABNORMAL LOW (ref 0.7–4.0)
MCH: 31.2 pg (ref 26.0–34.0)
MCHC: 35 g/dL (ref 30.0–36.0)
MCV: 89.3 fL (ref 80.0–100.0)
Monocytes Absolute: 0.4 10*3/uL (ref 0.1–1.0)
Monocytes Relative: 3 %
Neutro Abs: 14.2 10*3/uL — ABNORMAL HIGH (ref 1.7–7.7)
Neutrophils Relative %: 91 %
Platelets: 151 10*3/uL (ref 150–400)
RBC: 4.84 MIL/uL (ref 4.22–5.81)
RDW: 12.2 % (ref 11.5–15.5)
WBC: 15.5 10*3/uL — ABNORMAL HIGH (ref 4.0–10.5)
nRBC: 0 % (ref 0.0–0.2)

## 2019-10-12 LAB — COOXEMETRY PANEL
Carboxyhemoglobin: 0.5 % (ref 0.5–1.5)
Methemoglobin: 1 % (ref 0.0–1.5)
O2 Saturation: 69.3 %
Total hemoglobin: 12.5 g/dL (ref 12.0–16.0)

## 2019-10-12 LAB — PHOSPHORUS
Phosphorus: 2.1 mg/dL — ABNORMAL LOW (ref 2.5–4.6)
Phosphorus: 5.3 mg/dL — ABNORMAL HIGH (ref 2.5–4.6)

## 2019-10-12 LAB — TROPONIN I (HIGH SENSITIVITY)
Troponin I (High Sensitivity): 121 ng/L (ref <18)
Troponin I (High Sensitivity): 78 ng/L — ABNORMAL HIGH (ref <18)

## 2019-10-12 LAB — LACTATE DEHYDROGENASE: LDH: 565 U/L — ABNORMAL HIGH (ref 98–192)

## 2019-10-12 LAB — URINE CULTURE: Culture: >=100000 — AB

## 2019-10-12 LAB — FERRITIN: Ferritin: 866 ng/mL — ABNORMAL HIGH (ref 24–336)

## 2019-10-12 LAB — PROTIME-INR
INR: 1.1 (ref 0.8–1.2)
Prothrombin Time: 14.2 seconds (ref 11.4–15.2)

## 2019-10-12 LAB — TRIGLYCERIDES: Triglycerides: 393 mg/dL — ABNORMAL HIGH (ref <150)

## 2019-10-12 LAB — D-DIMER, QUANTITATIVE: D-Dimer, Quant: 8.99 ug/mL-FEU — ABNORMAL HIGH (ref 0.00–0.50)

## 2019-10-12 LAB — MAGNESIUM
Magnesium: 1.6 mg/dL — ABNORMAL LOW (ref 1.7–2.4)
Magnesium: 2.5 mg/dL — ABNORMAL HIGH (ref 1.7–2.4)

## 2019-10-12 IMAGING — DX DG CHEST 1V PORT
1 series · 1 of 1 positions shown · non-contrast
Comparison: Earlier today

CLINICAL DATA: Intubation

EXAM:
PORTABLE CHEST 1 VIEW

[chest ap]
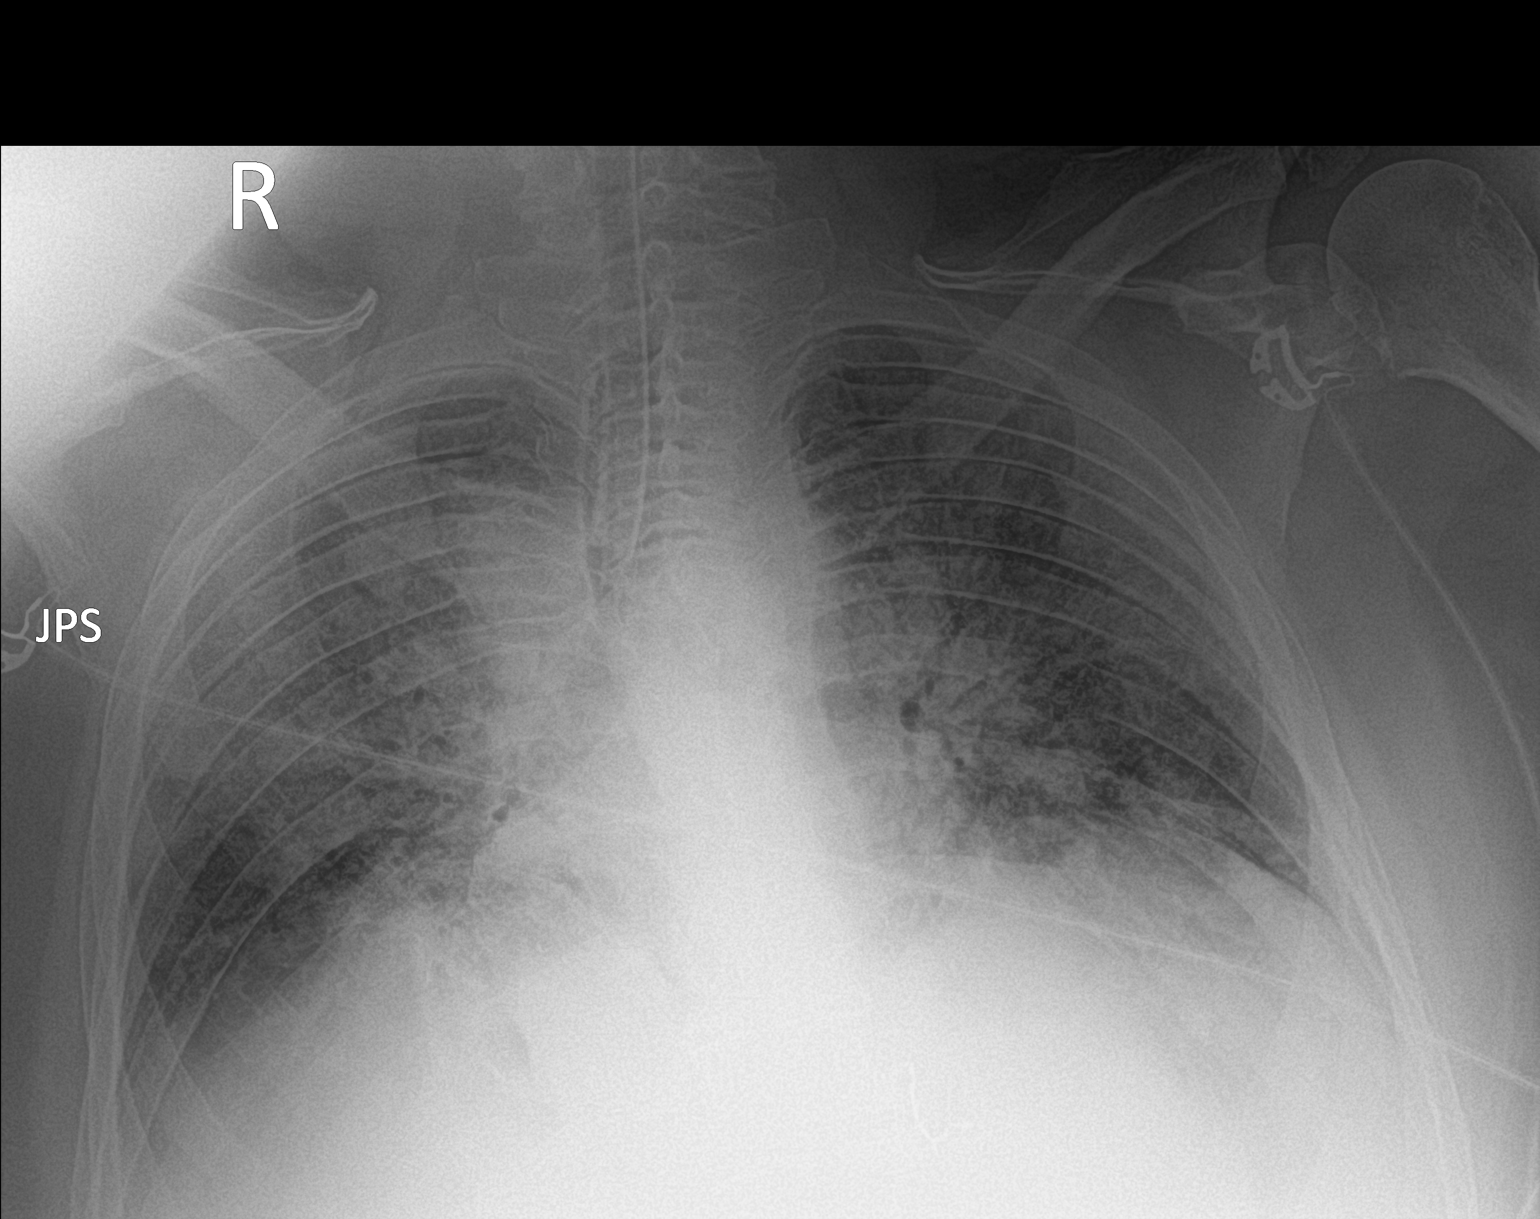

[1 of 1 positions shown; findings below may reference images not displayed]

FINDINGS: New endotracheal tube with tip at the clavicular heads, measuring 3
cm above the carina. Low volume chest with severe airspace disease.
No visible effusion or air leak. Stable heart size which is
distorted by positioning and volumes.
IMPRESSION: 1. Unremarkable positioning of the endotracheal tube.
2. Stable extensive airspace disease with low lung volumes.

## 2019-10-12 IMAGING — DX DG CHEST 1V PORT
1 series · 1 of 1 positions shown · non-contrast
Comparison: Three days ago

CLINICAL DATA: Shortness of breath

EXAM:
PORTABLE CHEST 1 VIEW

[chest]
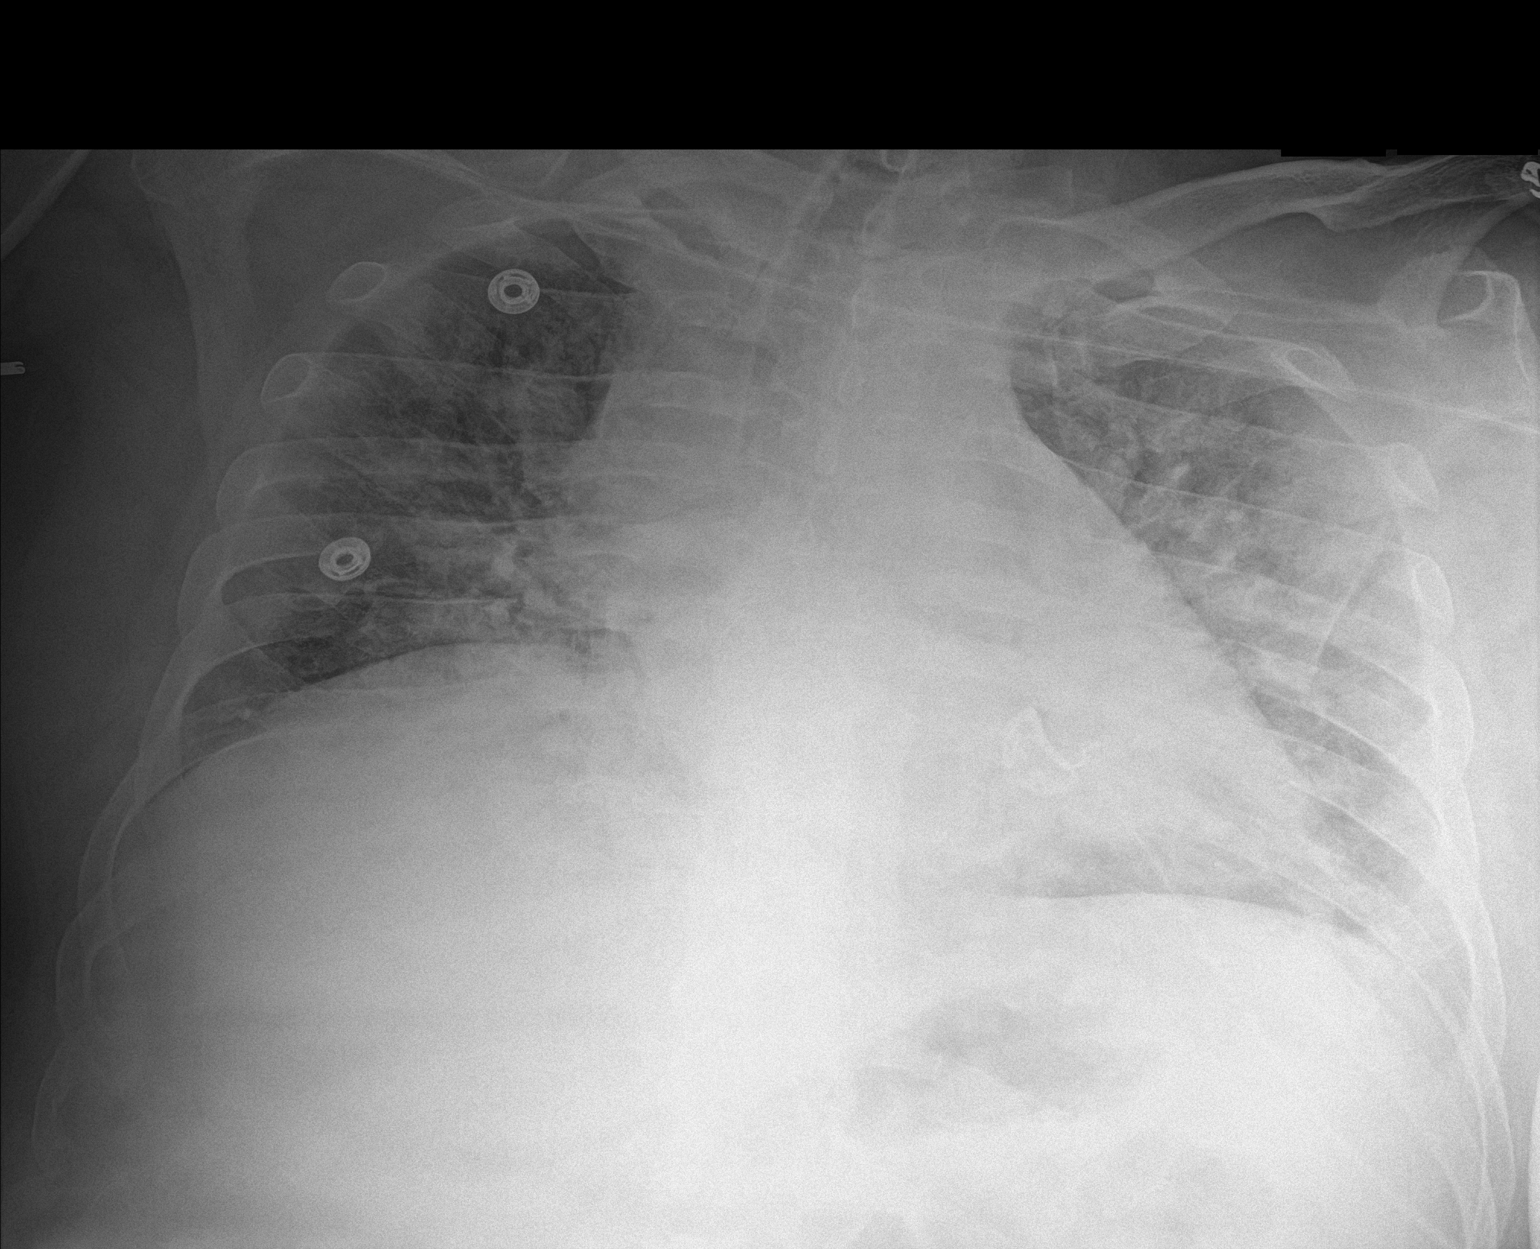

[1 of 1 positions shown; findings below may reference images not displayed]

FINDINGS: Very low volume chest with diffuse and patchy pulmonary opacity.
Vascular pedicle widening and prominent heart size largely related
to technique. No visible effusion or pneumothorax.
IMPRESSION: Worsening multifocal pneumonia.  Lung volumes remain very low.

## 2019-10-12 IMAGING — DX DG ABD PORTABLE 1V
2 series · 2 of 2 positions shown · non-contrast
Comparison: Portable exam [NI] hours without priors for comparison

CLINICAL DATA: Onychogryphosis

EXAM:
PORTABLE ABDOMEN - 1 VIEW

[abdomen kub (1 of 2)]
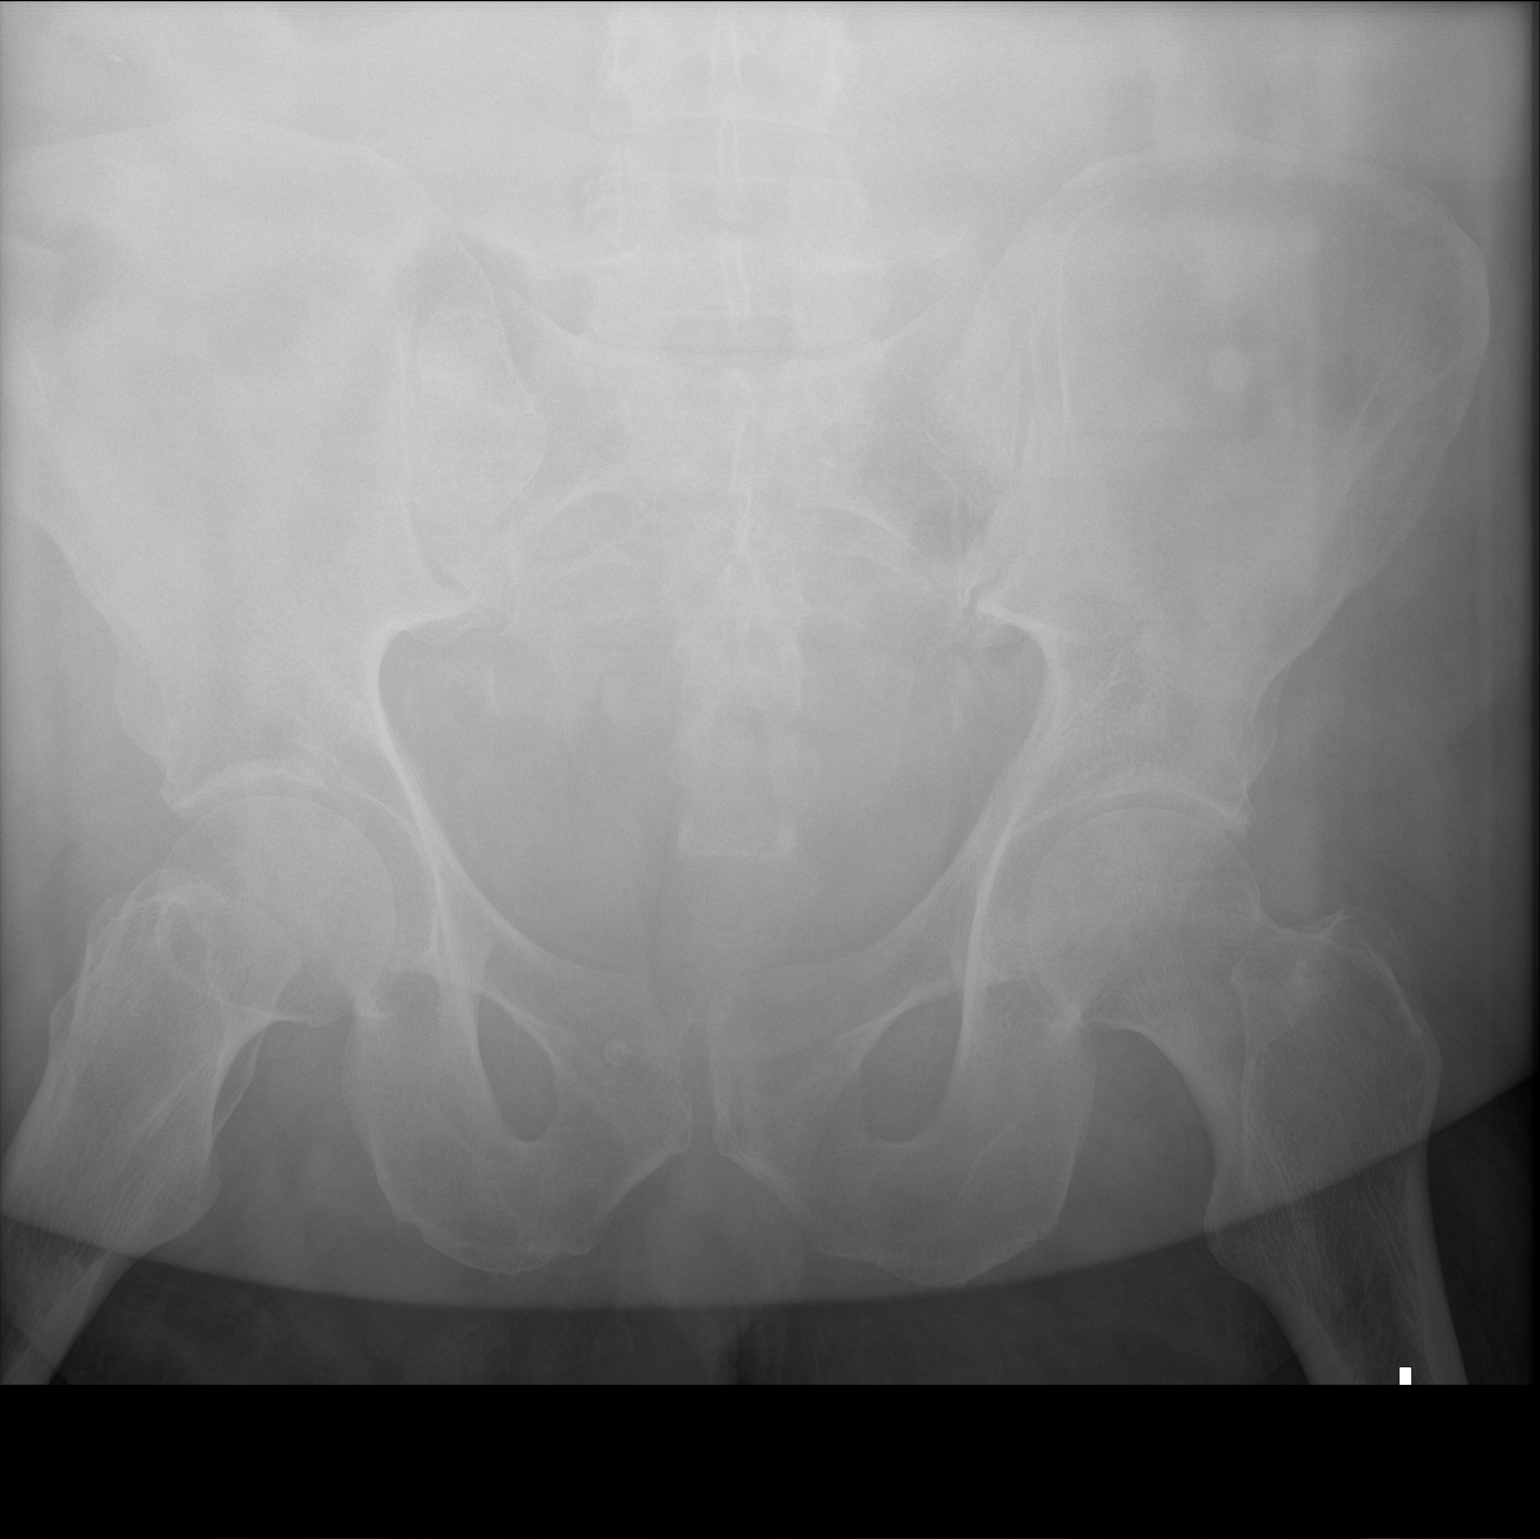

[abdomen kub (2 of 2)]
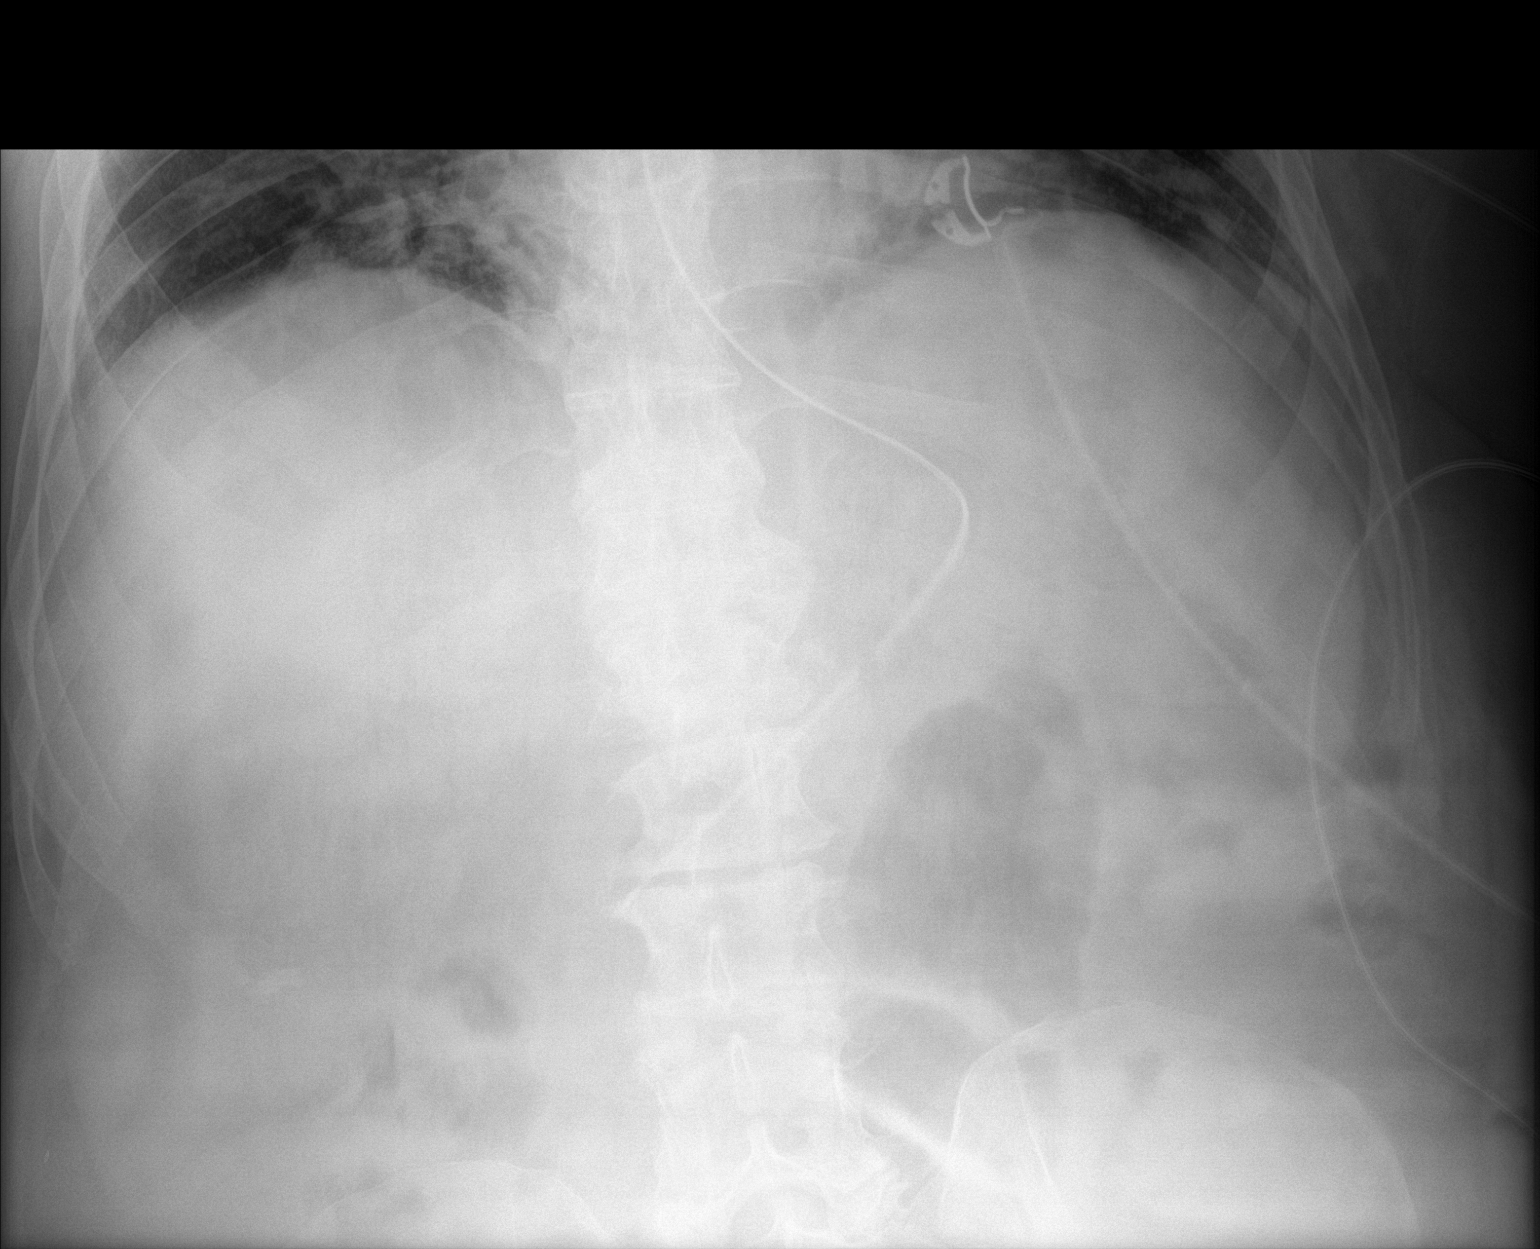

[2 of 2 positions shown; findings below may reference images not displayed]

FINDINGS: Tip of nasogastric tube projects over distal antrum.

Nonobstructive bowel gas pattern.

No bowel dilatation or bowel wall thickening.

Patchy bibasilar infiltrates.

No acute osseous findings.
IMPRESSION: Tip of nasogastric tube projects over distal gastric antrum.

Nonobstructive bowel gas pattern.

Bibasilar pulmonary infiltrates.

## 2019-10-12 MED ORDER — POLYETHYLENE GLYCOL 3350 17 G PO PACK
17.0000 g | PACK | Freq: Every day | ORAL | Status: DC
  Filled 2019-10-12: qty 1

## 2019-10-12 MED ORDER — CEFAZOLIN SODIUM-DEXTROSE 2-4 GM/100ML-% IV SOLN
2.0000 g | Freq: Three times a day (TID) | INTRAVENOUS | Status: DC
  Administered 2019-10-12 – 2019-10-26 (×43): 2 g via INTRAVENOUS
  Filled 2019-10-12 (×44): qty 100

## 2019-10-12 MED ORDER — SODIUM CHLORIDE 0.9 % IV SOLN
INTRAVENOUS | Status: DC | PRN
  Administered 2019-10-28: 250 mL via INTRAVENOUS

## 2019-10-12 MED ORDER — VITAL HIGH PROTEIN PO LIQD: 1000.0000 mL | ORAL | Status: DC

## 2019-10-12 MED ORDER — PROSOURCE TF PO LIQD
45.0000 mL | Freq: Two times a day (BID) | ORAL | Status: DC
  Administered 2019-10-12: 45 mL
  Filled 2019-10-12: qty 45

## 2019-10-12 MED ORDER — ENOXAPARIN SODIUM 40 MG/0.4ML ~~LOC~~ SOLN
40.0000 mg | Freq: Every day | SUBCUTANEOUS | Status: DC
  Administered 2019-10-12: 40 mg via SUBCUTANEOUS
  Filled 2019-10-12: qty 0.4

## 2019-10-12 MED ORDER — DEXTROSE 50 % IV SOLN: 0.0000 mL | INTRAVENOUS | Status: DC | PRN

## 2019-10-12 MED ORDER — METHYLPREDNISOLONE SODIUM SUCC 125 MG IJ SOLR
60.0000 mg | Freq: Three times a day (TID) | INTRAMUSCULAR | Status: DC
  Administered 2019-10-12 – 2019-10-15 (×9): 60 mg via INTRAVENOUS
  Filled 2019-10-12 (×9): qty 2

## 2019-10-12 MED ORDER — MIDAZOLAM HCL 2 MG/2ML IJ SOLN
INTRAMUSCULAR | Status: AC
  Administered 2019-10-12: 2 mg
  Filled 2019-10-12: qty 2

## 2019-10-12 MED ORDER — FENTANYL BOLUS VIA INFUSION
50.0000 ug | INTRAVENOUS | Status: DC | PRN
  Administered 2019-10-12 – 2019-10-15 (×6): 50 ug via INTRAVENOUS
  Filled 2019-10-12: qty 50

## 2019-10-12 MED ORDER — ENOXAPARIN SODIUM 60 MG/0.6ML ~~LOC~~ SOLN
0.5000 mg/kg | Freq: Every day | SUBCUTANEOUS | Status: DC
  Administered 2019-10-13: 55 mg via SUBCUTANEOUS
  Filled 2019-10-12: qty 0.6

## 2019-10-12 MED ORDER — DOCUSATE SODIUM 50 MG/5ML PO LIQD
100.0000 mg | Freq: Two times a day (BID) | ORAL | Status: DC
  Filled 2019-10-12: qty 10

## 2019-10-12 MED ORDER — BARICITINIB 2 MG PO TABS
4.0000 mg | ORAL_TABLET | Freq: Every day | ORAL | Status: DC
  Administered 2019-10-12 – 2019-10-14 (×3): 4 mg via ORAL
  Filled 2019-10-12 (×4): qty 2

## 2019-10-12 MED ORDER — FENTANYL CITRATE (PF) 100 MCG/2ML IJ SOLN
INTRAMUSCULAR | Status: AC
  Administered 2019-10-12: 100 ug
  Filled 2019-10-12: qty 2

## 2019-10-12 MED ORDER — DEXMEDETOMIDINE HCL IN NACL 400 MCG/100ML IV SOLN: 0.0000 ug/kg/h | INTRAVENOUS | Status: DC

## 2019-10-12 MED ORDER — FAMOTIDINE IN NACL 20-0.9 MG/50ML-% IV SOLN
20.0000 mg | Freq: Two times a day (BID) | INTRAVENOUS | Status: DC
  Administered 2019-10-12 – 2019-10-16 (×9): 20 mg via INTRAVENOUS
  Filled 2019-10-12 (×9): qty 50

## 2019-10-12 MED ORDER — ARTIFICIAL TEARS OPHTHALMIC OINT
1.0000 "application " | TOPICAL_OINTMENT | Freq: Three times a day (TID) | OPHTHALMIC | Status: DC
  Administered 2019-10-12 – 2019-10-15 (×9): 1 via OPHTHALMIC
  Filled 2019-10-12: qty 3.5

## 2019-10-12 MED ORDER — ETOMIDATE 2 MG/ML IV SOLN
20.0000 mg | Freq: Once | INTRAVENOUS | Status: AC
  Administered 2019-10-12: 20 mg via INTRAVENOUS

## 2019-10-12 MED ORDER — ROCURONIUM BROMIDE 50 MG/5ML IV SOLN
100.0000 mg | Freq: Once | INTRAVENOUS | Status: AC
  Administered 2019-10-12: 100 mg via INTRAVENOUS

## 2019-10-12 MED ORDER — VECURONIUM BROMIDE 10 MG IV SOLR
10.0000 mg | INTRAVENOUS | Status: DC | PRN
  Administered 2019-10-12 – 2019-10-13 (×4): 10 mg via INTRAVENOUS
  Filled 2019-10-12 (×5): qty 10

## 2019-10-12 MED ORDER — METHYLPREDNISOLONE SODIUM SUCC 125 MG IJ SOLR
55.0000 mg | Freq: Once | INTRAMUSCULAR | Status: AC
  Administered 2019-10-12: 55 mg via INTRAVENOUS

## 2019-10-12 MED ORDER — PROPOFOL 1000 MG/100ML IV EMUL
5.0000 ug/kg/min | INTRAVENOUS | Status: DC
  Administered 2019-10-12: 30 ug/kg/min via INTRAVENOUS
  Administered 2019-10-12: 80 ug/kg/min via INTRAVENOUS
  Filled 2019-10-12 (×2): qty 100

## 2019-10-12 MED ORDER — SODIUM CHLORIDE 0.9% FLUSH
10.0000 mL | Freq: Two times a day (BID) | INTRAVENOUS | Status: DC
  Administered 2019-10-12: 10 mL
  Administered 2019-10-12: 40 mL
  Administered 2019-10-13 – 2019-10-21 (×15): 10 mL
  Administered 2019-10-22: 20 mL
  Administered 2019-10-22 – 2019-10-23 (×3): 10 mL
  Administered 2019-10-27: 20 mL
  Administered 2019-10-29 – 2019-11-14 (×10): 10 mL

## 2019-10-12 MED ORDER — PROSOURCE TF PO LIQD
45.0000 mL | Freq: Four times a day (QID) | ORAL | Status: DC
  Administered 2019-10-12 – 2019-10-19 (×27): 45 mL
  Filled 2019-10-12 (×25): qty 45

## 2019-10-12 MED ORDER — SODIUM CHLORIDE 0.9% FLUSH: 10.0000 mL | INTRAVENOUS | Status: DC | PRN

## 2019-10-12 MED ORDER — FENTANYL 2500MCG IN NS 250ML (10MCG/ML) PREMIX INFUSION
50.0000 ug/h | INTRAVENOUS | Status: DC
  Administered 2019-10-12 – 2019-10-15 (×9): 300 ug/h via INTRAVENOUS
  Filled 2019-10-12 (×9): qty 250

## 2019-10-12 MED ORDER — SODIUM CHLORIDE 0.9 % IV SOLN: INTRAVENOUS | Status: DC | PRN

## 2019-10-12 MED ORDER — ORAL CARE MOUTH RINSE
15.0000 mL | OROMUCOSAL | Status: DC
  Administered 2019-10-12 – 2019-10-22 (×96): 15 mL via OROMUCOSAL

## 2019-10-12 MED ORDER — FENTANYL CITRATE (PF) 100 MCG/2ML IJ SOLN
50.0000 ug | Freq: Once | INTRAMUSCULAR | Status: AC
  Administered 2019-10-12: 50 ug via INTRAVENOUS

## 2019-10-12 MED ORDER — INSULIN ASPART 100 UNIT/ML ~~LOC~~ SOLN
5.0000 [IU] | SUBCUTANEOUS | Status: DC
  Administered 2019-10-12 (×2): 5 [IU] via SUBCUTANEOUS

## 2019-10-12 MED ORDER — PROPOFOL 1000 MG/100ML IV EMUL
25.0000 ug/kg/min | INTRAVENOUS | Status: DC
  Administered 2019-10-12: 60 ug/kg/min via INTRAVENOUS
  Administered 2019-10-12 – 2019-10-13 (×12): 80 ug/kg/min via INTRAVENOUS
  Filled 2019-10-12 (×9): qty 100
  Filled 2019-10-12: qty 200
  Filled 2019-10-12 (×2): qty 100

## 2019-10-12 MED ORDER — INSULIN REGULAR(HUMAN) IN NACL 100-0.9 UT/100ML-% IV SOLN
INTRAVENOUS | Status: DC
  Administered 2019-10-12: 30 [IU]/h via INTRAVENOUS
  Administered 2019-10-12: 24 [IU]/h via INTRAVENOUS
  Filled 2019-10-12 (×3): qty 100

## 2019-10-12 MED ORDER — VITAL 1.5 CAL PO LIQD
1000.0000 mL | ORAL | Status: DC
  Administered 2019-10-12 – 2019-10-16 (×4): 1000 mL
  Filled 2019-10-12 (×9): qty 1000

## 2019-10-12 MED ORDER — INSULIN ASPART 100 UNIT/ML ~~LOC~~ SOLN
0.0000 [IU] | SUBCUTANEOUS | Status: DC
  Administered 2019-10-12 (×2): 20 [IU] via SUBCUTANEOUS
  Administered 2019-10-12: 11 [IU] via SUBCUTANEOUS

## 2019-10-12 MED ORDER — DOCUSATE SODIUM 100 MG PO CAPS: 100.0000 mg | ORAL_CAPSULE | Freq: Two times a day (BID) | ORAL | Status: DC | PRN

## 2019-10-12 MED ORDER — FENTANYL 2500MCG IN NS 250ML (10MCG/ML) PREMIX INFUSION
0.0000 ug/h | INTRAVENOUS | Status: DC
  Administered 2019-10-12: 100 ug/h via INTRAVENOUS
  Filled 2019-10-12: qty 250

## 2019-10-12 MED ORDER — POTASSIUM PHOSPHATES 15 MMOLE/5ML IV SOLN
15.0000 mmol | Freq: Once | INTRAVENOUS | Status: AC
  Administered 2019-10-12: 15 mmol via INTRAVENOUS
  Filled 2019-10-12: qty 5

## 2019-10-12 MED ORDER — FUROSEMIDE 10 MG/ML IJ SOLN
40.0000 mg | Freq: Once | INTRAMUSCULAR | Status: AC
  Administered 2019-10-12: 40 mg via INTRAVENOUS
  Filled 2019-10-12: qty 4

## 2019-10-12 MED ORDER — DOCUSATE SODIUM 50 MG/5ML PO LIQD
100.0000 mg | Freq: Two times a day (BID) | ORAL | Status: DC | PRN
  Filled 2019-10-12: qty 10

## 2019-10-12 MED ORDER — SODIUM CHLORIDE 0.9 % IV SOLN: 2.0000 g | INTRAVENOUS | Status: DC

## 2019-10-12 MED ORDER — IPRATROPIUM-ALBUTEROL 0.5-2.5 (3) MG/3ML IN SOLN
3.0000 mL | Freq: Four times a day (QID) | RESPIRATORY_TRACT | Status: DC
  Administered 2019-10-12 – 2019-10-16 (×17): 3 mL via RESPIRATORY_TRACT
  Filled 2019-10-12 (×17): qty 3

## 2019-10-12 MED ORDER — POLYETHYLENE GLYCOL 3350 17 G PO PACK
17.0000 g | PACK | Freq: Every day | ORAL | Status: DC | PRN
  Administered 2019-10-12: 17 g via ORAL

## 2019-10-12 MED ORDER — MAGNESIUM SULFATE 4 GM/100ML IV SOLN
4.0000 g | Freq: Once | INTRAVENOUS | Status: AC
  Administered 2019-10-12: 4 g via INTRAVENOUS
  Filled 2019-10-12: qty 100

## 2019-10-12 MED ORDER — CHLORHEXIDINE GLUCONATE 0.12% ORAL RINSE (MEDLINE KIT)
15.0000 mL | Freq: Two times a day (BID) | OROMUCOSAL | Status: DC
  Administered 2019-10-12 – 2019-10-30 (×36): 15 mL via OROMUCOSAL

## 2019-10-12 MED ORDER — CHLORHEXIDINE GLUCONATE CLOTH 2 % EX PADS
6.0000 | MEDICATED_PAD | Freq: Every day | CUTANEOUS | Status: DC
  Administered 2019-10-12 – 2019-11-15 (×32): 6 via TOPICAL

## 2019-10-12 MED ORDER — POTASSIUM CHLORIDE 10 MEQ/100ML IV SOLN
10.0000 meq | INTRAVENOUS | Status: AC
  Administered 2019-10-12 (×4): 10 meq via INTRAVENOUS
  Filled 2019-10-12 (×5): qty 100

## 2019-10-12 NOTE — Progress Notes (Signed)
[facility] PCCM  LVEF 30-35%, mod depression in RV, no vegetation Coox OK UOP ok Not on levophed  Hyperglycemia worse  Plan: Hold vasopressors Repeat coox in AM Start insulin infusion  Heber [place], MD [facility] PCCM Pager: [phone] Cell: [phone] If no response, call [phone]

## 2019-10-12 NOTE — Progress Notes (Signed)
eLink Physician-Brief Progress Note Patient Name: DECODA VAN DOB: March 31, 1970 MRN: [id number]   Date of Service  10/12/2019  HPI/Events of Note  Patient recently intubated  And needs an order for sedation on the ventilator.  eICU Interventions  Propofol ordered.        Migdalia Dk 10/12/2019, 6:36 AM

## 2019-10-12 NOTE — Progress Notes (Signed)
Received pt from [facility] and placed on HHFNC. RT will closely monitor pt

## 2019-10-12 NOTE — Progress Notes (Signed)
Assisted tele visit to patient with wife.  Thomas, Lakiah Dhingra Renee, RN   

## 2019-10-12 NOTE — Progress Notes (Signed)
Patient is a 49 yo M with a PMHx of asthma, CHF, NIDDM, gout, HTN, IBS, obesity (BMI 31.46) presented to [facility] ED on 10/09/2019 with complaints of cough and SOB. SpO2 86% on RA on arrival, initially placed on 4L supplemental O2 but continued to have increasing oxygen requirements. SARS-CoV-2 PCR test positive. CXR done 8/16 showing multifocal pneumonia. Labs done at the ED showing elevated lactate 2.4, hypokalemia, hypomagnesemia, and mild hyponatremia. Blood glucose in the 400s on arrival to the ED on 8/16. Procalcitonin 1.52. Inflammatory markers elevated - D-dimer 2.92 and CRP 23.6. Blood cultures positive for both Streptococcus and Staphylococcus. Urine culture growing >100,000 colonies of staph aureus.   Patient was accepted for admission by the hospitalist service but has been boarding at [facility] ED since 8/16 due to lack of bed availability . He was given Decadron 6mg  x1 on 8/16, remdesivir, vancomycin, ceftriaxone, insulin, potassium supplementation, magnesium supplementation, and IVF. Patient arrived [facility] tonight. Paged by nursing staff upon patient arrival due to concern for respiratory distress and worsening hypoxemia.   Patient was immediately seen and examined at bedside. Hypoxic to the mid-70s on 50 L oxygen with H-HFNC, 100% FiO2 and 15 L via NRB. Tachypnic with RR up to 40s. Tachycardic with HR in 110s.  Awake and alert. Stated he does not feel well and complained of SOB. Course breath sounds appreciated upon auscultation of the lungs. No wheezing. Supplemental O2 increased to maximum capacity - 70 L via H-HFNC, 100% FiO2 and 15 L via NRB but patient continued to be hypoxic to the mid-70s with no improvement in tachypnea. PCCM consulted. Due to epic downtime, verbal orders placed for STAT CXR and patient was given IV Solumedrol 0.5/Kg (55 mg) x1. Spoke to RT and was told ABG could not be done due to epic downtime. Unable to start Bipap in 5W unit. I was in the room with the patient  until PCCM arrived. He is urgently being transferred to the ICU for further management.

## 2019-10-12 NOTE — Progress Notes (Signed)
°  Echocardiogram 2D Echocardiogram has been performed.  Micheal Bell 10/12/2019, 10:41 AM

## 2019-10-12 NOTE — Progress Notes (Signed)
Pt's head turned to the left and arms rotated. ETT secure throughout

## 2019-10-12 NOTE — Progress Notes (Signed)
Inpatient Diabetes Program Recommendations  AACE/ADA: New Consensus Statement on Inpatient Glycemic Control (2015)  Target Ranges:  Prepandial:   less than 140 mg/dL      Peak postprandial:   less than 180 mg/dL (1-2 hours)      Critically ill patients:  140 - 180 mg/dL   Lab Results  Component Value Date   GLUCAP 298 (H) 10/12/2019   HGBA1C 11.6 (H) 10/10/2019    Review of Glycemic Control Results for VEER, ELAMIN (MRN [id number]) as of 10/12/2019 10:58  Ref. Range 10/11/2019 15:43 10/11/2019 20:24 10/12/2019 03:44 10/12/2019 07:59  Glucose-Capillary Latest Ref Range: 70 - 99 mg/dL 974 (H) 163 (H) 845 (H) 298 (H)   Diabetes history: Type 2 DM Outpatient Diabetes medications: Actos 15 mg QD, Glipizide 10 mg QAM Current orders for Inpatient glycemic control: Novolog 5 units Q4H, Novolog 0-20 units Q4H Solumedrol 60 mg Q8H Vital 40 ml/hr  Inpatient Diabetes Program Recommendations:    In addition to recent changes, also consider adding Levemir 14 units BID and Tradjenta 5 mg QD.  Will plan to speak with patient when appropriate.   Thanks, Lujean Rave, MSN, RNC-OB Diabetes Coordinator [phone] (8a-5p)

## 2019-10-12 NOTE — Progress Notes (Signed)
Brief ID Progress Note:   Micheal Bell is a 49 y.o. male admitted with COVID pneumonia/ARDS. Also found to have active bacteremia with staphylococcus aureus and group b streptococcus.   With active infection resulting in bacteremia I worry about giving Baricitinib in this setting. Would recommend to defer at the moment until discussion with Dr. Drue Second & Dr. Kendrick Fries further.   Will see patient in formal consultation with auto-consult from [facility] bacteremia. Will narrow to cefazolin.   Rexene Alberts, MSN, NP-C [facility] Infectious Disease [facility]  [email or web address] Pager: [phone] Office: [phone] [facility] Main Line: [phone]

## 2019-10-12 NOTE — Consult Note (Signed)
[facility]  Total days of antibiotics 4         Reason for Consult: bacteremia    Referring Physician: [email or web address]  Active Problems:   COVID-19 virus infection   Hyperglycemia   Hypoxia    HPI: Micheal Bell is a 49 y.o. male with type 2 DM non insulin dependent, HTN, Gout, fibromyalgia, ?CHF, presented with 1 week history of worsening shortness of breath, found to have covid-19 pneumonia and subsequently intubated 36hr later. His infectious work up also revealed that he had had MSSA and group b strep bacteremia. On exam his left lateral foot has a plantar ulcer with draining purulence as possible source of bacteremia. He is undergoing bedside TTE presently, easily becoming hypoxic with movement  Past Medical History:  Diagnosis Date   Arthritis    Asthma    CHF (congestive heart failure) (HCC)    Diabetes mellitus without complication (HCC)    Fibromyalgia    Gout    Hypertension    IBS (irritable bowel syndrome)    Vein disorder     Allergies:  Allergies  Allergen Reactions   Azithromycin    Lodine [Etodolac] Anaphylaxis   MEDICATIONS:  artificial tears  1 application Both Eyes Q8H   baricitinib  4 mg Oral Daily   Chlorhexidine Gluconate Cloth  6 each Topical Daily   [START ON 10/13/2019] enoxaparin (LOVENOX) injection  0.5 mg/kg Subcutaneous Daily   feeding supplement (PROSource TF)  45 mL Per Tube BID   feeding supplement (VITAL HIGH PROTEIN)  1,000 mL Per Tube Q24H   insulin aspart  0-20 Units Subcutaneous Q4H   insulin aspart  5 Units Subcutaneous Q4H   ipratropium-albuterol  3 mL Nebulization Q6H   methylPREDNISolone (SOLU-MEDROL) injection  60 mg Intravenous Q8H    Social History   Tobacco Use   Smoking status: Never Smoker   Smokeless tobacco: Never Used  Vaping Use   Vaping Use: Never used  Substance Use Topics   Alcohol use: Never   Drug use: Never    No family history on file.  Review of  Systems - Unable to obtain since intubated/sedated  OBJECTIVE: Temp:  [99 F (37.2 C)-102 F (38.9 C)] 99.4 F (37.4 C) (08/19 0800) Pulse Rate:  [90-122] 90 (08/19 0849) Resp:  [17-50] 18 (08/19 0849) BP: (109-189)/(69-115) 111/74 (08/19 0849) SpO2:  [62 %-96 %] 62 % (08/19 0849) FiO2 (%):  [100 %] 100 % (08/19 0728) Physical Exam  Constitutional: He is sedated. He appears well-developed and well-nourished. No distress.  HENT:  Mouth/Throat: OETT.  Cardiovascular: tachy, regular rhythm and normal heart sounds. Exam reveals no gallop and no friction rub.  No murmur heard.  Pulmonary/Chest: Effort normal and breath sounds normal. No respiratory distress.  Abdominal: Soft. Bowel sounds are decreased. mildly distension. There is no tenderness.  Ext: left foot lateral ulcer expressed purulence Skin: Skin is warm and dry. No rash noted. No erythema.   LABS: Results for orders placed or performed during the hospital encounter of 10/09/19 (from the past 48 hour(s))  CBG monitoring, ED     Status: Abnormal   Collection Time: 10/10/19 10:29 AM  Result Value Ref Range   Glucose-Capillary 163 (H) 70 - 99 mg/dL    Comment: Glucose reference range applies only to samples taken after fasting for at least 8 hours.  CBG monitoring, ED     Status: Abnormal   Collection Time: 10/10/19  2:01 PM  Result Value  Ref Range   Glucose-Capillary 168 (H) 70 - 99 mg/dL    Comment: Glucose reference range applies only to samples taken after fasting for at least 8 hours.  Basic metabolic panel     Status: Abnormal   Collection Time: 10/10/19  2:11 PM  Result Value Ref Range   Sodium 130 (L) 135 - 145 mmol/L   Potassium 3.2 (L) 3.5 - 5.1 mmol/L   Chloride 92 (L) 98 - 111 mmol/L   CO2 25 22 - 32 mmol/L   Glucose, Bld 177 (H) 70 - 99 mg/dL    Comment: Glucose reference range applies only to samples taken after fasting for at least 8 hours.   BUN 17 6 - 20 mg/dL   Creatinine, Ser 1.61 0.61 - 1.24 mg/dL    Calcium 7.7 (L) 8.9 - 10.3 mg/dL   GFR calc non Af Amer >60 >60 mL/min   GFR calc Af Amer >60 >60 mL/min   Anion gap 13 5 - 15    Comment: Performed at [facility], [address] [place], [place] [address]  Magnesium     Status: Abnormal   Collection Time: 10/10/19  2:11 PM  Result Value Ref Range   Magnesium 1.0 (L) 1.7 - 2.4 mg/dL    Comment: Performed at [facility], [address] [place], [place] [address]  CBG monitoring, ED     Status: Abnormal   Collection Time: 10/10/19  6:51 PM  Result Value Ref Range   Glucose-Capillary 251 (H) 70 - 99 mg/dL    Comment: Glucose reference range applies only to samples taken after fasting for at least 8 hours.  CBG monitoring, ED     Status: Abnormal   Collection Time: 10/10/19 10:17 PM  Result Value Ref Range   Glucose-Capillary 236 (H) 70 - 99 mg/dL    Comment: Glucose reference range applies only to samples taken after fasting for at least 8 hours.  Lactic acid, plasma     Status: Abnormal   Collection Time: 10/10/19 10:27 PM  Result Value Ref Range   Lactic Acid, Venous 2.0 (HH) 0.5 - 1.9 mmol/L    Comment: CRITICAL RESULT CALLED TO, READ BACK BY AND VERIFIED WITH: ADKINS LISA RN AT 2317 ON 10/10/19 BY [email or web address] Performed at [facility], [address] [place], [place] [address]   HIV Antibody (routine testing w rflx)     Status: None   Collection Time: 10/10/19 10:27 PM  Result Value Ref Range   HIV Screen 4th Generation wRfx Non Reactive Non Reactive    Comment: Performed at [facility], [address], [place], [place] [address]  CBG monitoring, ED     Status: Abnormal   Collection Time: 10/11/19  1:26 AM  Result Value Ref Range   Glucose-Capillary 241 (H) 70 - 99 mg/dL    Comment: Glucose reference range applies only to samples taken after fasting for at least 8 hours.  Lactic acid, plasma     Status: Abnormal   Collection Time: 10/11/19  5:25 AM  Result Value Ref Range   Lactic  Acid, Venous 2.0 (HH) 0.5 - 1.9 mmol/L    Comment: ADKINS, L AT 0631 ON [id number] BY Devota Pace Performed at [facility], [address] [place], [place] [address]   CBG monitoring, ED     Status: Abnormal   Collection Time: 10/11/19  6:12 AM  Result Value Ref Range  Glucose-Capillary 208 (H) 70 - 99 mg/dL    Comment: Glucose reference range applies only to samples taken after fasting for at least 8 hours.   Comment 1 Notify RN   CBG monitoring, ED     Status: Abnormal   Collection Time: 10/11/19  9:25 AM  Result Value Ref Range   Glucose-Capillary 203 (H) 70 - 99 mg/dL    Comment: Glucose reference range applies only to samples taken after fasting for at least 8 hours.  CBG monitoring, ED     Status: Abnormal   Collection Time: 10/11/19 11:40 AM  Result Value Ref Range   Glucose-Capillary 192 (H) 70 - 99 mg/dL    Comment: Glucose reference range applies only to samples taken after fasting for at least 8 hours.  CBG monitoring, ED     Status: Abnormal   Collection Time: 10/11/19  3:43 PM  Result Value Ref Range   Glucose-Capillary 194 (H) 70 - 99 mg/dL    Comment: Glucose reference range applies only to samples taken after fasting for at least 8 hours.  CBG monitoring, ED     Status: Abnormal   Collection Time: 10/11/19  8:24 PM  Result Value Ref Range   Glucose-Capillary 192 (H) 70 - 99 mg/dL    Comment: Glucose reference range applies only to samples taken after fasting for at least 8 hours.  Glucose, capillary     Status: Abnormal   Collection Time: 10/12/19  3:44 AM  Result Value Ref Range   Glucose-Capillary 219 (H) 70 - 99 mg/dL    Comment: Glucose reference range applies only to samples taken after fasting for at least 8 hours.  I-STAT 7, (LYTES, BLD GAS, ICA, H+H)     Status: Abnormal   Collection Time: 10/12/19  3:53 AM  Result Value Ref Range   pH, Arterial 7.445 7.35 - 7.45   pCO2 arterial 36.6 32 - 48 mmHg   pO2, Arterial 54 (L) 83 - 108 mmHg    Bicarbonate 24.7 20.0 - 28.0 mmol/L   TCO2 26 22 - 32 mmol/L   O2 Saturation 86.0 %   Acid-Base Excess 1.0 0.0 - 2.0 mmol/L   Sodium 129 (L) 135 - 145 mmol/L   Potassium 3.2 (L) 3.5 - 5.1 mmol/L   Calcium, Ion 1.08 (L) 1.15 - 1.40 mmol/L   HCT 37.0 (L) 39 - 52 %   Hemoglobin 12.6 (L) 13.0 - 17.0 g/dL   Patient temperature 536.1 F    Collection site Radial    Drawn by RT    Sample type ARTERIAL   Comprehensive metabolic panel     Status: Abnormal   Collection Time: 10/12/19  4:23 AM  Result Value Ref Range   Sodium 130 (L) 135 - 145 mmol/L   Potassium 3.1 (L) 3.5 - 5.1 mmol/L   Chloride 89 (L) 98 - 111 mmol/L   CO2 24 22 - 32 mmol/L   Glucose, Bld 253 (H) 70 - 99 mg/dL    Comment: Glucose reference range applies only to samples taken after fasting for at least 8 hours.   BUN 10 6 - 20 mg/dL   Creatinine, Ser 4.43 0.61 - 1.24 mg/dL   Calcium 8.4 (L) 8.9 - 10.3 mg/dL   Total Protein 6.7 6.5 - 8.1 g/dL   Albumin 2.3 (L) 3.5 - 5.0 g/dL   AST 67 (H) 15 - 41 U/L   ALT 36 0 - 44 U/L   Alkaline Phosphatase 115 38 - 126 U/L  Total Bilirubin 1.3 (H) 0.3 - 1.2 mg/dL   GFR calc non Af Amer >60 >60 mL/min   GFR calc Af Amer >60 >60 mL/min   Anion gap 17 (H) 5 - 15    Comment: Performed at [facility], [address], [place], [place] [address]  Magnesium     Status: Abnormal   Collection Time: 10/12/19  4:23 AM  Result Value Ref Range   Magnesium 1.6 (L) 1.7 - 2.4 mg/dL    Comment: Performed at [facility], [address], [place], [place] [address]  Phosphorus     Status: Abnormal   Collection Time: 10/12/19  4:23 AM  Result Value Ref Range   Phosphorus 2.1 (L) 2.5 - 4.6 mg/dL    Comment: Performed at [facility], [address], [place], [place] [address]  CBC WITH DIFFERENTIAL     Status: Abnormal   Collection Time: 10/12/19  4:23 AM  Result Value Ref Range   WBC 15.5 (H) 4.0 - 10.5 K/uL   RBC 4.84 4.22 - 5.81 MIL/uL   Hemoglobin 15.1 13.0 - 17.0 g/dL    HCT 63.8 39 - 52 %   MCV 89.3 80.0 - 100.0 fL   MCH 31.2 26.0 - 34.0 pg   MCHC 35.0 30.0 - 36.0 g/dL   RDW 93.7 34.2 - 87.6 %   Platelets 151 150 - 400 K/uL   nRBC 0.0 0.0 - 0.2 %   Neutrophils Relative % 91 %   Neutro Abs 14.2 (H) 1.7 - 7.7 K/uL   Lymphocytes Relative 4 %   Lymphs Abs 0.6 (L) 0.7 - 4.0 K/uL   Monocytes Relative 3 %   Monocytes Absolute 0.4 0 - 1 K/uL   Eosinophils Relative 0 %   Eosinophils Absolute 0.0 0 - 0 K/uL   Basophils Relative 1 %   Basophils Absolute 0.1 0 - 0 K/uL   WBC Morphology DOHLE BODIES     Comment: TO PATHOLOGY TO REVIEW LYMPHOCYTES   Immature Granulocytes 1 %   Abs Immature Granulocytes 0.18 (H) 0.00 - 0.07 K/uL    Comment: Performed at [facility], [address] [place], [place] [address]  Protime-INR     Status: None   Collection Time: 10/12/19  4:23 AM  Result Value Ref Range   Prothrombin Time 14.2 11.4 - 15.2 seconds   INR 1.1 0.8 - 1.2    Comment: (NOTE) INR goal varies based on device and disease states. Performed at [facility], [address] [place], [place] [address]   Lactate dehydrogenase     Status: Abnormal   Collection Time: 10/12/19  4:23 AM  Result Value Ref Range   LDH 565 (H) 98 - 192 U/L    Comment: Performed at [facility], [address], [place], [place] [address]  C-reactive protein     Status: Abnormal   Collection Time: 10/12/19  4:23 AM  Result Value Ref Range   CRP 34.4 (H) <1.0 mg/dL    Comment: Performed at [facility], [address] [place], [place] [address]  D-dimer, quantitative (not at [facility])     Status: Abnormal   Collection Time: 10/12/19  4:23 AM  Result Value Ref Range   D-Dimer, Quant 8.99 (H) 0.00 - 0.50 ug/mL-FEU    Comment: (NOTE) At the manufacturer cut-off of 0.50 ug/mL FEU, this assay has been documented to exclude PE with a sensitivity and negative  predictive value of 97 to 99%.  At this time, this assay has not been approved by the FDA to exclude  DVT/VTE. Results should be correlated with clinical presentation. Performed at [facility], [address] [place], [place] [address]   Ferritin     Status: Abnormal   Collection Time: 10/12/19  4:23 AM  Result Value Ref Range   Ferritin 866 (H) 24 - 336 ng/mL    Comment: Performed at [facility], [address], [place], [place] [address]  I-STAT 7, (LYTES, BLD GAS, ICA, H+H)     Status: Abnormal   Collection Time: 10/12/19  5:11 AM  Result Value Ref Range   pH, Arterial 7.472 (H) 7.35 - 7.45   pCO2 arterial 36.1 32 - 48 mmHg   pO2, Arterial 55 (L) 83 - 108 mmHg   Bicarbonate 26.0 20.0 - 28.0 mmol/L   TCO2 27 22 - 32 mmol/L   O2 Saturation 88.0 %   Acid-Base Excess 3.0 (H) 0.0 - 2.0 mmol/L   Sodium 128 (L) 135 - 145 mmol/L   Potassium 3.0 (L) 3.5 - 5.1 mmol/L   Calcium, Ion 1.08 (L) 1.15 - 1.40 mmol/L   HCT 38.0 (L) 39 - 52 %   Hemoglobin 12.9 (L) 13.0 - 17.0 g/dL   Patient temperature 518.8 F    Collection site Radial    Drawn by RT    Sample type ARTERIAL   Lactic acid, plasma     Status: Abnormal   Collection Time: 10/12/19  5:25 AM  Result Value Ref Range   Lactic Acid, Venous 2.1 (HH) 0.5 - 1.9 mmol/L    Comment: CRITICAL RESULT CALLED TO, READ BACK BY AND VERIFIED WITH: WARD,S RN @ 0745 10/12/19 LEONARD,A Performed at [facility], [address], [place], [place] [address]   I-STAT 7, (LYTES, BLD GAS, ICA, H+H)     Status: Abnormal   Collection Time: 10/12/19  7:52 AM  Result Value Ref Range   pH, Arterial 7.268 (L) 7.35 - 7.45   pCO2 arterial 57.6 (H) 32 - 48 mmHg   pO2, Arterial 71 (L) 83 - 108 mmHg   Bicarbonate 26.2 20.0 - 28.0 mmol/L   TCO2 28 22 - 32 mmol/L   O2 Saturation 90.0 %   Acid-base deficit 2.0 0.0 - 2.0 mmol/L   Sodium 129 (L) 135 - 145 mmol/L   Potassium 3.4 (L) 3.5 - 5.1 mmol/L   Calcium, Ion 1.10 (L) 1.15 - 1.40 mmol/L   HCT 42.0 39 - 52 %   Hemoglobin 14.3 13.0 - 17.0 g/dL   Patient temperature 63.0 F    Collection  site Radial    Drawn by Operator    Sample type ARTERIAL   Glucose, capillary     Status: Abnormal   Collection Time: 10/12/19  7:59 AM  Result Value Ref Range   Glucose-Capillary 298 (H) 70 - 99 mg/dL    Comment: Glucose reference range applies only to samples taken after fasting for at least 8 hours.    MICRO: 8/16 blood cx +MSSA 8/16 urine cx +MSSA IMAGING: DG CHEST PORT 1 VIEW  Result Date: 10/12/2019 CLINICAL DATA:  Intubation EXAM: PORTABLE CHEST 1 VIEW COMPARISON:  Earlier today FINDINGS: New endotracheal tube with tip at the clavicular heads, measuring 3 cm above the carina. Low volume chest with severe airspace disease. No visible effusion or air leak. Stable heart size which is distorted by positioning and volumes. IMPRESSION: 1.  Unremarkable positioning of the endotracheal tube. 2. Stable extensive airspace disease with low lung volumes. Electronically Signed   By: Marnee Spring M.D.   On: 10/12/2019 07:13   DG CHEST PORT 1 VIEW  Result Date: 10/12/2019 CLINICAL DATA:  Shortness of breath EXAM: PORTABLE CHEST 1 VIEW COMPARISON:  Three days ago FINDINGS: Very low volume chest with diffuse and patchy pulmonary opacity. Vascular pedicle widening and prominent heart size largely related to technique. No visible effusion or pneumothorax. IMPRESSION: Worsening multifocal pneumonia.  Lung volumes remain very low. Electronically Signed   By: Marnee Spring M.D.   On: 10/12/2019 04:07   DG Abd Portable 1V  Result Date: 10/12/2019 CLINICAL DATA:  Onychogryphosis EXAM: PORTABLE ABDOMEN - 1 VIEW COMPARISON:  Portable exam 0830 hours without priors for comparison FINDINGS: Tip of nasogastric tube projects over distal antrum. Nonobstructive bowel gas pattern. No bowel dilatation or bowel wall thickening. Patchy bibasilar infiltrates. No acute osseous findings. IMPRESSION: Tip of nasogastric tube projects over distal gastric antrum. Nonobstructive bowel gas pattern. Bibasilar pulmonary  infiltrates. Electronically Signed   By: Ulyses Southward M.D.   On: 10/12/2019 08:51   [place] EKG SITE RITE  Result Date: 10/12/2019 If Site Rite image not attached, placement could not be confirmed due to current cardiac rhythm.   Assessment/Plan:  49yo M with MSSA bacteremia and COVID pneumonia sepsis, respiratory distress remains intubation.  mssa sepsis- continue with cefazolin  covid-pneumonia = continue on remdesivir, steroid and IL-6 inhibitor. Critical care management by dr Ulyses Jarred team.

## 2019-10-12 NOTE — Progress Notes (Addendum)
Initial Nutrition Assessment  DOCUMENTATION CODES:   Not applicable  INTERVENTION:   Tube Feeding:  Vital 1.5 at 45 ml/hr Pro-Stat 45 mL QID Provides 117 g of protein, 1780 kcals, 821 mL of free water Meets 100% estimated protein needs  TF regimen and propofol at current rate providing 2390 total kcal/day    NUTRITION DIAGNOSIS:   Inadequate oral intake related to acute illness as evidenced by NPO status.  GOAL:   Patient will meet greater than or equal to 90% of their needs  MONITOR:   Weight trends, Vent status, Labs, TF tolerance, Skin  REASON FOR ASSESSMENT:   Ventilator    ASSESSMENT:   49 yo male admitted with COVID 19 pneumonia requiring intubation. PMH includes HTN, gout, DM, CHF, IBS   8/16 Admitted to [facility] 8/19 Transferred to [facility], Intubated  Pt remains on vent support, plan to prone today, on paralytic, propofol at 23.1 ml/hr  OG tube in stomach, Adult TF protocol initiated this AM  Pt with wound to L. Foot after stepping on nail a few weeks ago.   Current weight 111.1 kg; no recent weight encounters  Unable to obtain diet and weight history at this time  CBGs as high as 400s, insulin drip started today  Labs: CBGs 298-441 Meds: ss novolog, novolog q 4 hours, solumedrol, potassium phosphate  Diet Order:   Diet Order            Diet NPO time specified  Diet effective now                 EDUCATION NEEDS:   Not appropriate for education at this time  Skin:  Skin Assessment: Skin Integrity Issues: Skin Integrity Issues:: Other (Comment) Other: wound to L. Foot after stepping on nail 2-3 weeks ago  Last BM:  8/17  Height:   Ht Readings from Last 1 Encounters:  10/10/19 6\' 2"  (1.88 m)    Weight:   Wt Readings from Last 1 Encounters:  10/10/19 111.1 kg    BMI:  Body mass index is 31.46 kg/m.  Estimated Nutritional Needs:   Kcal:  2000-2200 kcals  Protein:  115-130 g  Fluid:  >/= 2 L   10/12/19 MS, RDN, LDN,  CNSC Registered Dietitian III Clinical Nutrition RD Pager and On-Call Pager Number Located in [place]

## 2019-10-12 NOTE — Progress Notes (Signed)
Peripherally Inserted Central Catheter Placement  The IV Nurse has discussed with the patient and/or persons authorized to consent for the patient, the purpose of this procedure and the potential benefits and risks involved with this procedure.  The benefits include less needle sticks, lab draws from the catheter, and the patient may be discharged home with the catheter. Risks include, but not limited to, infection, bleeding, blood clot (thrombus formation), and puncture of an artery; nerve damage and irregular heartbeat and possibility to perform a PICC exchange if needed/ordered by physician.  Alternatives to this procedure were also discussed.  Bard Power PICC patient education guide, fact sheet on infection prevention and patient information card has been provided to patient /or left at bedside.    PICC Placement Documentation  PICC Triple Lumen 10/12/19 PICC Right Basilic 44 cm 0 cm (Active)  Indication for Insertion or Continuance of Line Vasoactive infusions 10/12/19 1052  Exposed Catheter (cm) 0 cm 10/12/19 1052  Site Assessment Dry;Intact 10/12/19 1052  Lumen #1 Status Blood return noted;Saline locked;Flushed 10/12/19 1052  Lumen #2 Status Flushed;Blood return noted;Saline locked 10/12/19 1052  Lumen #3 Status Flushed;Blood return noted;Saline locked 10/12/19 1052  Dressing Type Transparent;Securing device 10/12/19 1052  Dressing Status Clean;Dry;Intact;Antimicrobial disc in place 10/12/19 1052  Dressing Change Due 10/19/19 10/12/19 1052       Romie Jumper 10/12/2019, 10:57 AM

## 2019-10-12 NOTE — Progress Notes (Signed)
   10/12/19 0200  Assess: MEWS Score  Temp 99.2 F (37.3 C)  BP 127/75  Pulse Rate (!) 117  ECG Heart Rate (!) 117  Resp (!) 40  Level of Consciousness Alert  SpO2 (!) 76 %  O2 Device HFNC;Non-rebreather Mask (Heated)  O2 Flow Rate (L/min) 40 L/min  FiO2 (%) 100 %  Assess: MEWS Score  MEWS Temp 0  MEWS Systolic 0  MEWS Pulse 2  MEWS RR 3  MEWS LOC 0  MEWS Score 5  MEWS Score Color Red  Assess: if the MEWS score is Yellow or Red  Were vital signs taken at a resting state? Yes  Focused Assessment No change from prior assessment  Early Detection of Sepsis Score *See Row Information* Low  MEWS guidelines implemented *See Row Information* Yes  Take Vital Signs  Increase Vital Sign Frequency  Red: Q 1hr X 4 then Q 4hr X 4, if remains red, continue Q 4hrs  Escalate  MEWS: Escalate Red: discuss with charge nurse/RN and provider, consider discussing with RRT  Notify: Charge Nurse/RN  Name of Charge Nurse/RN Notified Casimiro Needle, Consulting civil engineer  Date Charge Nurse/RN Notified 10/12/19  Time Charge Nurse/RN Notified 0205  Notify: Provider  Provider Name/Title V. Loney Loh, MD  Date Provider Notified 10/12/19  Time Provider Notified 0205  Notification Type Page  Notification Reason Change in status  Response See new orders  Date of Provider Response 10/12/19  Time of Provider Response 0205   Pt was admitted around 0100~ from [facility] and at admission Epic was experiencing downtime. Per transfer report, pt was on 15L O2 w/ NRB on and was SpO2 >90%. On arrival to unit, pt's SpO2 was around mid 60s-70s. RT was contacted and pt was placed on 30L/100% FiO2 and was still continuing at mid 70s SpO2; O2 was adjusted to 40L/100% @ 0148 then 50L/100% @ 0230 and finally at 70L/100% @ 0245. Rathore MD was at bedside and initiated contact w/ ICU team and ICU team then gave verbal order to transfer pt to the ICU. Pt was transferred to the ICU w/ assistance from [place], [place]; bedside report given  to ICU RN.

## 2019-10-12 NOTE — Progress Notes (Signed)
RT NOTE: Pt placed in prone position by RT x 2, RN x 3 and NT x 1 with out complications. ETT resecured with cloth tape at 25 at the lips, with mepalex pads placed on cheeks and upper lip prior to proning.

## 2019-10-12 NOTE — Progress Notes (Signed)
Mg 1.6, K+ 3.1, Phos 2.1 Electrolytes replaced per protocol

## 2019-10-12 NOTE — Procedures (Signed)
Intubation Procedure Note  Micheal Bell  [id number]  May 01, 1970  Date:10/12/19  Time:6:02 AM   Provider Performing:Venus Gilles, Tennis Must    Procedure: Intubation (31500)  Indication(s) Respiratory Failure  Consent Risks of the procedure as well as the alternatives and risks of each were explained to the patient and/or caregiver.  Consent for the procedure was obtained and is signed in the bedside chart   Anesthesia Etomidate, Versed, Fentanyl and Rocuronium   Time Out Verified patient identification, verified procedure, site/side was marked, verified correct patient position, special equipment/implants available, medications/allergies/relevant history reviewed, required imaging and test results available.   Sterile Technique Usual hand hygeine, masks, and gloves were used   Procedure Description Patient positioned in bed supine.  Sedation given as noted above.  Patient was intubated with endotracheal tube using Glidescope.  View was Grade 1 full glottis .  Number of attempts was 1.  Colorimetric CO2 detector was consistent with tracheal placement.   Complications/Tolerance None; patient tolerated the procedure well. Chest X-ray is ordered to verify placement.   EBL Minimal   Specimen(s) None

## 2019-10-12 NOTE — Progress Notes (Signed)
Pt's head turned to the right and arms rotated. ETT secured throughout. 

## 2019-10-12 NOTE — Progress Notes (Signed)
NAME:  Micheal Bell, MRN:  [id number], DOB:  1970/06/16, LOS: 0 ADMISSION DATE:  10/09/2019, CONSULTATION DATE:  8/19  REFERRING MD:  Leafy Half, CHIEF COMPLAINT:  COVID 39   Brief History   49 year old male admitted 8/16 for COVID 19 pneumonia. 8/19 early AM decompensated requiring up to 70 L HHFNC and NRB with O2 sats in the 70s and complaints of "wearing out" PCCM consulted.  Also treated for staph bacteremia and UTI. Partially vaccinated, first dose of vaccine was on 8/10. Developed cold symptoms on 8/11.  Past Medical History  Hypertension Gout DM2 CHF Asthma Irritable bowel syndrome fibromyalgia   Significant Hospital Events   8/16 present to [facility], COVID pos on 4L 8/19 transfer to [facility] on 70L HF and NRB, transferred to ICU due to respiratory fatigue, intubated  Consults:    Procedures:  8/19 ETT >   Significant Diagnostic Tests:    Micro Data:  8/16 SARS COV 2 > positive 8/16 Urine > MSSA 8/16 Blood > staph aureus 2/2, group B strep 2/2 8/19 blood >   Antimicrobials/COVID Rx:  8/16 remdesivir >  8/19 Baricitinib >  8/19 solumedrol >   ========  8/17 ceftriaxone >  8/17 vanc >   Interim history/subjective:  Brought to ICU overnight, intubated around 6 AM  Objective   Blood pressure 112/89, pulse (!) 105, temperature (!) 102 F (38.9 C), temperature source Axillary, resp. rate (!) 22, height 6\' 2"  (1.88 m), weight 111.1 kg, SpO2 94 %.    Vent Mode: PRVC FiO2 (%):  [100 %] 100 % Set Rate:  [12 bmp-22 bmp] 22 bmp Vt Set:  [580 mL] 580 mL PEEP:  [8 cmH20-16 cmH20] 16 cmH20 Plateau Pressure:  [32 cmH20] 32 cmH20   Intake/Output Summary (Last 24 hours) at 10/12/2019 10/14/2019 Last data filed at 10/12/2019 10/14/2019 Gross per 24 hour  Intake 1200 ml  Output 1650 ml  Net -450 ml   Filed Weights   10/10/19 2211  Weight: 111.1 kg    Examination: General:  In bed on vent HENT: NCAT ETT in place PULM: CTA B, vent supported breathing CV: RRR, no mgr GI:  BS+, soft, nontender MSK: normal bulk and tone Neuro: sedated on vent Derm: left foot 2-3cm diameter shallow ulcer       8/17 CXR > bilateral airspace disease, ETT in place  Resolved Hospital Problem list     Assessment & Plan:  ARDS due to COVID 19 Continue mechanical ventilation per ARDS protocol Target TVol 6-8cc/kgIBW Target Plateau Pressure < 30cm H20 Target driving pressure less than 15 cm of water Target PaO2 55-65: titrate PEEP/FiO2 per protocol As long as PaO2 to FiO2 ratio is less than 1:150 position in prone position for 16 hours a day Check CVP daily if CVL in place Target CVP less than 4, diurese as necessary Ventilator associated pneumonia prevention protocol 8/18 solumedrol, remdesivir, baricitinib, place PICC and check CVP, prone after PICC and echo  Severe ventilator dyssynchrony causing profound hypoxemia Stop PAD Start neuromuscular blockade protocol Fentanyl, propofol per NMB protocol  Acute on chronic CHF, uncertain if diastolic or systolic Check HS troponin to screen for COVID myocarditis Check echo prior to prone  Staph aureus bacteremia and UTI Left foot wound: stepped on a nail 2-3 weeks prior to admission Will need tee Repeat cultures Antibiotics per ID, can we stop ceftriaxone? 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 Free water  restrict Replace K Monitor BMET and UOP Replace electrolytes as needed  DM2 Add tube feeding coverage SSI  Best practice:  Diet: start tube feeding Pain/Anxiety/Delirium protocol (if indicated): as VAP protocol (if indicated): yes DVT prophylaxis: lovenox GI prophylaxis: start famotidine Glucose control: as above Mobility: bed rest Code Status: full Family Communication: I called his sister Rosey Bath for an update on 8/19 Disposition: remain in ICU  Labs   CBC: Recent Labs  Lab 10/09/19 1900 10/12/19 0353  10/12/19 0423 10/12/19 0511  WBC 9.1  --  15.5*  --   NEUTROABS 8.1*  --  14.2*  --   HGB 15.1 12.6* 15.1 12.9*  HCT 43.6 37.0* 43.2 38.0*  MCV 88.1  --  89.3  --   PLT 139*  --  151  --     Basic Metabolic Panel: Recent Labs  Lab 10/09/19 1900 10/10/19 1411 10/12/19 0353 10/12/19 0423 10/12/19 0511  NA 125* 130* 129* 130* 128*  K 2.9* 3.2* 3.2* 3.1* 3.0*  CL 82* 92*  --  89*  --   CO2 27 25  --  24  --   GLUCOSE 464* 177*  --  253*  --   BUN 23* 17  --  10  --   CREATININE 1.04 0.91  --  0.91  --   CALCIUM 8.6* 7.7*  --  8.4*  --   MG  --  1.0*  --  1.6*  --   PHOS  --   --   --  2.1*  --    GFR: Estimated Creatinine Clearance: 127.4 mL/min (by C-G formula based on SCr of 0.91 mg/dL). Recent Labs  Lab 10/09/19 1900 10/09/19 2200 10/10/19 0115 10/10/19 2227 10/11/19 0525 10/12/19 0423  PROCALCITON  --   --  1.52  --   --   --   WBC 9.1  --   --   --   --  15.5*  LATICACIDVEN 2.4* 2.2*  --  2.0* 2.0*  --     Liver Function Tests: Recent Labs  Lab 10/09/19 1900 10/12/19 0423  AST 47* 67*  ALT 33 36  ALKPHOS 92 115  BILITOT 0.9 1.3*  PROT 7.6 6.7  ALBUMIN 3.6 2.3*   No results for input(s): LIPASE, AMYLASE in the last 168 hours. No results for input(s): AMMONIA in the last 168 hours.  ABG    Component Value Date/Time   PHART 7.472 (H) 10/12/2019 0511   PCO2ART 36.1 10/12/2019 0511   PO2ART 55 (L) 10/12/2019 0511   HCO3 26.0 10/12/2019 0511   TCO2 27 10/12/2019 0511   O2SAT 88.0 10/12/2019 0511     Coagulation Profile: Recent Labs  Lab 10/12/19 0423  INR 1.1    Cardiac Enzymes: No results for input(s): CKTOTAL, CKMB, CKMBINDEX, TROPONINI in the last 168 hours.  HbA1C: Hgb A1c MFr Bld  Date/Time Value Ref Range Status  10/10/2019 01:15 AM 11.6 (H) 4.8 - 5.6 % Final    Comment:    (NOTE) Pre diabetes:          5.7%-6.4%  Diabetes:              >6.4%  Glycemic control for   <7.0% adults with diabetes     CBG: Recent Labs  Lab  10/11/19 0925 10/11/19 1140 10/11/19 1543 10/11/19 2024 10/12/19 0344  GLUCAP 203* 192* 194* 192* 219*    Critical care time: 40 minutes     Heber Summerton, MD [place] [facility] Pager:  [phone] Cell: [phone] If no response, call [phone]

## 2019-10-12 NOTE — Consult Note (Signed)
NAME:  Micheal Bell, MRN:  [id number], DOB:  Sep 08, 1970, LOS: 0 ADMISSION DATE:  10/09/2019, CONSULTATION DATE:  8/19 REFERRING MD:  Dr. Leafy Half TRH, CHIEF COMPLAINT:  COVId 19 pneumonia   Brief History   49 year old male admitted 8/16 for COVID 19 pneumonia. 8/19 early AM decompensated requiring up to 70 L HHFNC and NRB with O2 sats in the 70s and complaints of "wearing out" PCCM consulted.   History of present illness   History limited by dypsnea and BiPAP, therefore some history taken from chart review.  49 year old male with PMH as below, which is significant for DM, CHF, OSA not on CPAP, and Asthma. He presented to [facility] 8/16 with complaints of cough, congestion, and myalgias x one week, which progressed to involve dyspnea. Upon arrival to the ED CXR was concerning for COVID. Satting 86% on room air at that time and improved with 4 L [place]. COVID PCR resulted positive. He was started on remdesivir and steroids. Oxygen requirements, however, continued to worsen. Escalating up to 50L heated high flow and concurrent non-rebreather. He was able to be transferred to [facility] in the early AM hours of 8/19. Upon arrival to [facility] his O2 sats were in low 70s and his HFNC was increased to 70lpm and PCCM was consulted.   Past Medical History   has a past medical history of Arthritis, Asthma, CHF (congestive heart failure) (HCC), Diabetes mellitus without complication (HCC), Fibromyalgia, Gout, Hypertension, IBS (irritable bowel syndrome), and Vein disorder.   Significant Hospital Events   8/16 present to [facility], COVID pos on 4L 8/19 transfer to [facility] on 70L HF and NRB, transferred to ICU due to respiratory fatigue.   Consults:    Procedures:    Significant Diagnostic Tests:    Micro Data:  Blood cx 8/16 > Straph aureus >>> Blood cx 8/16 . Groub B Strep >>> Urine cx > Staph aureus >>>  Antimicrobials:  Ceftriaxone 8/19 > Vancomycin 8/19 >  Interim  history/subjective:    Objective   Blood pressure (!) 146/97, pulse (!) 108, temperature 99 F (37.2 C), temperature source Oral, resp. rate 18, height 6\' 2"  (1.88 m), weight 111.1 kg, SpO2 94 %.    FiO2 (%):  [100 %] 100 %   Intake/Output Summary (Last 24 hours) at 10/12/2019 0342 Last data filed at 10/11/2019 2013 Gross per 24 hour  Intake 1200 ml  Output 1200 ml  Net 0 ml   Filed Weights   10/10/19 2211  Weight: 111.1 kg    Examination: General: overweight male in moderate resp distress  HENT: /AT, PERRL, no JVD Lungs: coarse bilaterally. Tachypneic, accessory muscle use Cardiovascular: RRR, no MRG Abdomen: Soft, non-tender, non-distended Extremities: No acute deformity or ROM limitation. No edema Neuro: Alert, oriented, non-focal   Resolved Hospital Problem list     Assessment & Plan:   ARDS secondary to COVID-19 infection - Transfer to ICU for intubation - BiPAP trial - Continue remdesivir for 5 day course - Solumedrol 1mg /kg/day for 3-5 days then taper - lasix x 1  - baricitinib 4mg  daily - Low threshold for intubation  Acute on chronic CHF: documented history, no echo on file. Has not received home lasix in a couple of days.  - Give lasix - Echocardiogram - ICU hemodynamic monitoring  - Holding home verapamil while clinical status is tenuous.  Bacteremia: staph aureus, group B strep by blood culture 8/16 Staph UTI - Continue ceftriaxone and  vancomycin - Continue to follow cultures for sensitivities  Asthma: without acute exaerbation OSA not on CPAP - Scheduled duoneb - Hold home dulera  Hypokalemia: on labs from 8/27 - repeat chemistry  DM - CBG monitoring and SSI - Holding home Actos, glipizide  Best practice:  Diet: NPO Pain/Anxiety/Delirium protocol (if indicated): NA VAP protocol (if indicated): NA DVT prophylaxis: enoxaparin  GI prophylaxis: PPI Glucose control: SSI Mobility: BR Code Status: FULL Family Communication: patient  updated, mother updated via phone Disposition: ICU  Labs   CBC: Recent Labs  Lab 10/09/19 1900  WBC 9.1  NEUTROABS 8.1*  HGB 15.1  HCT 43.6  MCV 88.1  PLT 139*    Basic Metabolic Panel: Recent Labs  Lab 10/09/19 1900 10/10/19 1411  NA 125* 130*  K 2.9* 3.2*  CL 82* 92*  CO2 27 25  GLUCOSE 464* 177*  BUN 23* 17  CREATININE 1.04 0.91  CALCIUM 8.6* 7.7*  MG  --  1.0*   GFR: Estimated Creatinine Clearance: 127.4 mL/min (by C-G formula based on SCr of 0.91 mg/dL). Recent Labs  Lab 10/09/19 1900 10/09/19 2200 10/10/19 0115 10/10/19 2227 10/11/19 0525  PROCALCITON  --   --  1.52  --   --   WBC 9.1  --   --   --   --   LATICACIDVEN 2.4* 2.2*  --  2.0* 2.0*    Liver Function Tests: Recent Labs  Lab 10/09/19 1900  AST 47*  ALT 33  ALKPHOS 92  BILITOT 0.9  PROT 7.6  ALBUMIN 3.6   No results for input(s): LIPASE, AMYLASE in the last 168 hours. No results for input(s): AMMONIA in the last 168 hours.  ABG No results found for: PHART, PCO2ART, PO2ART, HCO3, TCO2, ACIDBASEDEF, O2SAT   Coagulation Profile: No results for input(s): INR, PROTIME in the last 168 hours.  Cardiac Enzymes: No results for input(s): CKTOTAL, CKMB, CKMBINDEX, TROPONINI in the last 168 hours.  HbA1C: Hgb A1c MFr Bld  Date/Time Value Ref Range Status  10/10/2019 01:15 AM 11.6 (H) 4.8 - 5.6 % Final    Comment:    (NOTE) Pre diabetes:          5.7%-6.4%  Diabetes:              >6.4%  Glycemic control for   <7.0% adults with diabetes     CBG: Recent Labs  Lab 10/11/19 0612 10/11/19 0925 10/11/19 1140 10/11/19 1543 10/11/19 2024  GLUCAP 208* 203* 192* 194* 192*    Review of Systems:   Bolds are positive  Constitutional: weight loss, gain, night sweats, Fevers, chills, fatigue .  HEENT: headaches, Sore throat, sneezing, nasal congestion, post nasal drip, Difficulty swallowing, Tooth/dental problems, visual complaints visual changes, ear ache CV:  chest pain,  radiates:,Orthopnea, PND, swelling in lower extremitie, dizziness, palpitations, syncope.  GI  heartburn, indigestion, abdominal pain, nausea, vomiting, diarrhea, change in bowel habits, loss of appetite, bloody stools.  Resp: cough, productive:, hemoptysis, dyspnea, chest pain, pleuritic.  Skin: rash or itching or icterus GU: dysuria, change in color of urine, urgency or frequency. flank pain, hematuria  MS: back pain or swelling. decreased range of motion  Psych: change in mood or affect. depression or anxiety.  Neuro: difficulty with speech, weakness, numbness, ataxia    Past Medical History  He,  has a past medical history of Arthritis, Asthma, CHF (congestive heart failure) (HCC), Diabetes mellitus without complication (HCC), Fibromyalgia, Gout, Hypertension, IBS (irritable bowel syndrome), and Vein disorder.  Surgical History    Past Surgical History:  Procedure Laterality Date  . FOOT SURGERY       Social History   reports that he has never smoked. He has never used smokeless tobacco. He reports that he does not drink alcohol and does not use drugs.   Family History   His family history is not on file.   Allergies Allergies  Allergen Reactions  . Azithromycin   . Lodine [Etodolac] Anaphylaxis     Home Medications  Prior to Admission medications   Medication Sig Start Date End Date Taking? Authorizing Provider  albuterol (PROVENTIL HFA;VENTOLIN HFA) 108 (90 Base) MCG/ACT inhaler Inhale into the lungs every 6 (six) hours as needed for wheezing or shortness of breath.    [provider]  allopurinol (ZYLOPRIM) 300 MG tablet Take 300 mg by mouth daily.    [provider]  buPROPion (WELLBUTRIN XL) 150 MG 24 hr tablet Take 150 mg by mouth every morning. 09/08/19   [provider]  chlorthalidone (HYGROTON) 25 MG tablet Take 25 mg by mouth daily.    [provider]  citalopram (CELEXA) 40 MG tablet Take 40 mg by mouth daily.    [provider]  cyclobenzaprine (FLEXERIL) 10 MG tablet Take 10 mg by mouth 3 (three) times daily as needed for muscle spasms.    [provider]  Fluticasone Propionate, Inhal, (FLOVENT IN) Inhale into the lungs.    [provider]  furosemide (LASIX) 20 MG tablet Take 20 mg by mouth.    [provider]  glipiZIDE (GLUCOTROL) 10 MG tablet Take 10 mg by mouth daily before breakfast.    [provider]  HYDROcodone-acetaminophen (NORCO/VICODIN) 5-325 MG tablet Take 1 tablet by mouth every 6 (six) hours as needed for moderate pain.    [provider]  ibuprofen (ADVIL) 600 MG tablet Take 600 mg by mouth every 6 (six) hours as needed. 03/21/19   [provider]  lidocaine (LIDODERM) 5 % Place 1 patch onto the skin daily. Remove & Discard patch within 12 hours or as directed by MD 05/12/17   Rise Mu, PA-C  meloxicam (MOBIC) 15 MG tablet Take 15 mg by mouth daily.    [provider]  mometasone-formoterol (DULERA) 100-5 MCG/ACT AERO Inhale 2 puffs into the lungs 2 (two) times daily.    [provider]  montelukast (SINGULAIR) 10 MG tablet Take 10 mg by mouth at bedtime.    [provider]  nitroGLYCERIN (NITRODUR - DOSED IN MG/24 HR) 0.2 mg/hr patch Place 0.2 mg onto the skin daily.    [provider]  nortriptyline (PAMELOR) 25 MG capsule Take 25 mg by mouth at bedtime.    [provider]  pantoprazole (PROTONIX) 40 MG tablet Take 40 mg by mouth daily.    [provider]  pioglitazone (ACTOS) 15 MG tablet Take 15 mg by mouth daily. 08/11/19   [provider]  predniSONE (DELTASONE) 10 MG tablet Take 10 mg by mouth daily with breakfast.    [provider]  traMADol (ULTRAM) 50 MG tablet Take 1 tablet (50 mg total) by mouth every 6 (six) hours as needed. 03/16/17   Rolland Porter, MD  verapamil (CALAN-SR) 240 MG CR tablet Take 240 mg by mouth at bedtime.    [provider]  zolpidem (AMBIEN) 10 MG tablet Take 10 mg by mouth at bedtime as needed for sleep.    [provider]  Critical care time: 50 mins      Joneen Roach, AGACNP-BC [place] [facility]  See Amion for personal pager PCCM on call pager [phone]  10/12/2019 4:43 AM

## 2019-10-12 NOTE — Progress Notes (Signed)
eLink Physician-Brief Progress Note Patient Name: Micheal Bell DOB: [birth year] MRN: [id number]   Date of Service  10/12/2019  HPI/Events of Note  Patient with acute hypoxemic respiratory failure secondary to Covid 19 pneumonia transferred to the ICU due to rapid deterioration raising concern regarding potential need for emergent intubation.  eICU Interventions  New Patient Evaluation completed.        Micheal Bell [name] 10/12/2019, 5:29 AM

## 2019-10-12 NOTE — Progress Notes (Signed)
Patient's head turned to the left and arms rotated.

## 2019-10-13 ENCOUNTER — Inpatient Hospital Stay (HOSPITAL_COMMUNITY): Payer: Medicaid Other

## 2019-10-13 LAB — CBC WITH DIFFERENTIAL/PLATELET
Abs Immature Granulocytes: 0.3 10*3/uL — ABNORMAL HIGH (ref 0.00–0.07)
Basophils Absolute: 0.1 10*3/uL (ref 0.0–0.1)
Basophils Relative: 0 %
Eosinophils Absolute: 0 10*3/uL (ref 0.0–0.5)
Eosinophils Relative: 0 %
HCT: 35 % — ABNORMAL LOW (ref 39.0–52.0)
Hemoglobin: 12.1 g/dL — ABNORMAL LOW (ref 13.0–17.0)
Immature Granulocytes: 2 %
Lymphocytes Relative: 7 %
Lymphs Abs: 0.9 10*3/uL (ref 0.7–4.0)
MCH: 31.1 pg (ref 26.0–34.0)
MCHC: 34.6 g/dL (ref 30.0–36.0)
MCV: 90 fL (ref 80.0–100.0)
Monocytes Absolute: 0.6 10*3/uL (ref 0.1–1.0)
Monocytes Relative: 5 %
Neutro Abs: 11.5 10*3/uL — ABNORMAL HIGH (ref 1.7–7.7)
Neutrophils Relative %: 86 %
Platelets: 142 10*3/uL — ABNORMAL LOW (ref 150–400)
RBC: 3.89 MIL/uL — ABNORMAL LOW (ref 4.22–5.81)
RDW: 12.1 % (ref 11.5–15.5)
WBC Morphology: ABNORMAL
WBC: 13.7 10*3/uL — ABNORMAL HIGH (ref 4.0–10.5)
nRBC: 0 % (ref 0.0–0.2)

## 2019-10-13 LAB — BASIC METABOLIC PANEL
Anion gap: 12 (ref 5–15)
BUN: 32 mg/dL — ABNORMAL HIGH (ref 6–20)
CO2: 22 mmol/L (ref 22–32)
Calcium: 6.7 mg/dL — ABNORMAL LOW (ref 8.9–10.3)
Chloride: 103 mmol/L (ref 98–111)
Creatinine, Ser: 1 mg/dL (ref 0.61–1.24)
GFR calc Af Amer: >60 mL/min (ref >60)
GFR calc non Af Amer: >60 mL/min (ref >60)
Glucose, Bld: 309 mg/dL — ABNORMAL HIGH (ref 70–99)
Potassium: 3.4 mmol/L — ABNORMAL LOW (ref 3.5–5.1)
Sodium: 137 mmol/L (ref 135–145)

## 2019-10-13 LAB — GLUCOSE, CAPILLARY
Glucose-Capillary: 160 mg/dL — ABNORMAL HIGH (ref 70–99)
Glucose-Capillary: 174 mg/dL — ABNORMAL HIGH (ref 70–99)
Glucose-Capillary: 181 mg/dL — ABNORMAL HIGH (ref 70–99)
Glucose-Capillary: 185 mg/dL — ABNORMAL HIGH (ref 70–99)
Glucose-Capillary: 189 mg/dL — ABNORMAL HIGH (ref 70–99)
Glucose-Capillary: 196 mg/dL — ABNORMAL HIGH (ref 70–99)
Glucose-Capillary: 218 mg/dL — ABNORMAL HIGH (ref 70–99)
Glucose-Capillary: 281 mg/dL — ABNORMAL HIGH (ref 70–99)
Glucose-Capillary: 342 mg/dL — ABNORMAL HIGH (ref 70–99)
Glucose-Capillary: 356 mg/dL — ABNORMAL HIGH (ref 70–99)
Glucose-Capillary: 384 mg/dL — ABNORMAL HIGH (ref 70–99)
Glucose-Capillary: 478 mg/dL — ABNORMAL HIGH (ref 70–99)

## 2019-10-13 LAB — TRIGLYCERIDES: Triglycerides: 1111 mg/dL — ABNORMAL HIGH (ref <150)

## 2019-10-13 LAB — COMPREHENSIVE METABOLIC PANEL
ALT: 23 U/L (ref 0–44)
AST: 48 U/L — ABNORMAL HIGH (ref 15–41)
Albumin: 1.9 g/dL — ABNORMAL LOW (ref 3.5–5.0)
Alkaline Phosphatase: 87 U/L (ref 38–126)
Anion gap: 11 (ref 5–15)
BUN: 28 mg/dL — ABNORMAL HIGH (ref 6–20)
CO2: 23 mmol/L (ref 22–32)
Calcium: 7.5 mg/dL — ABNORMAL LOW (ref 8.9–10.3)
Chloride: 94 mmol/L — ABNORMAL LOW (ref 98–111)
Creatinine, Ser: 1.11 mg/dL (ref 0.61–1.24)
GFR calc Af Amer: >60 mL/min (ref >60)
GFR calc non Af Amer: >60 mL/min (ref >60)
Glucose, Bld: 168 mg/dL — ABNORMAL HIGH (ref 70–99)
Potassium: 2.8 mmol/L — ABNORMAL LOW (ref 3.5–5.1)
Sodium: 128 mmol/L — ABNORMAL LOW (ref 135–145)
Total Bilirubin: 0.8 mg/dL (ref 0.3–1.2)
Total Protein: 5.5 g/dL — ABNORMAL LOW (ref 6.5–8.1)

## 2019-10-13 LAB — POCT I-STAT 7, (LYTES, BLD GAS, ICA,H+H)
Acid-Base Excess: 1 mmol/L (ref 0.0–2.0)
Bicarbonate: 27.7 mmol/L (ref 20.0–28.0)
Calcium, Ion: 1.1 mmol/L — ABNORMAL LOW (ref 1.15–1.40)
HCT: 33 % — ABNORMAL LOW (ref 39.0–52.0)
Hemoglobin: 11.2 g/dL — ABNORMAL LOW (ref 13.0–17.0)
O2 Saturation: 92 %
Patient temperature: 97.7
Potassium: 4 mmol/L (ref 3.5–5.1)
Sodium: 133 mmol/L — ABNORMAL LOW (ref 135–145)
TCO2: 29 mmol/L (ref 22–32)
pCO2 arterial: 54.4 mmHg — ABNORMAL HIGH (ref 32.0–48.0)
pH, Arterial: 7.312 — ABNORMAL LOW (ref 7.350–7.450)
pO2, Arterial: 68 mmHg — ABNORMAL LOW (ref 83.0–108.0)

## 2019-10-13 LAB — MAGNESIUM
Magnesium: 2.6 mg/dL — ABNORMAL HIGH (ref 1.7–2.4)
Magnesium: 2 mg/dL (ref 1.7–2.4)

## 2019-10-13 LAB — PATHOLOGIST SMEAR REVIEW

## 2019-10-13 LAB — PHOSPHORUS
Phosphorus: 4.3 mg/dL (ref 2.5–4.6)
Phosphorus: 3.1 mg/dL (ref 2.5–4.6)

## 2019-10-13 IMAGING — DX DG CHEST 1V PORT
1 series · 1 of 1 positions shown · non-contrast
Comparison: Portable exam [98] hours compared to [DATE]

CLINICAL DATA: Acute respiratory failure with hypoxemia, COVID
positive, asthma, diabetes mellitus, hypertension, CHF

EXAM:
PORTABLE CHEST 1 VIEW

[chest]
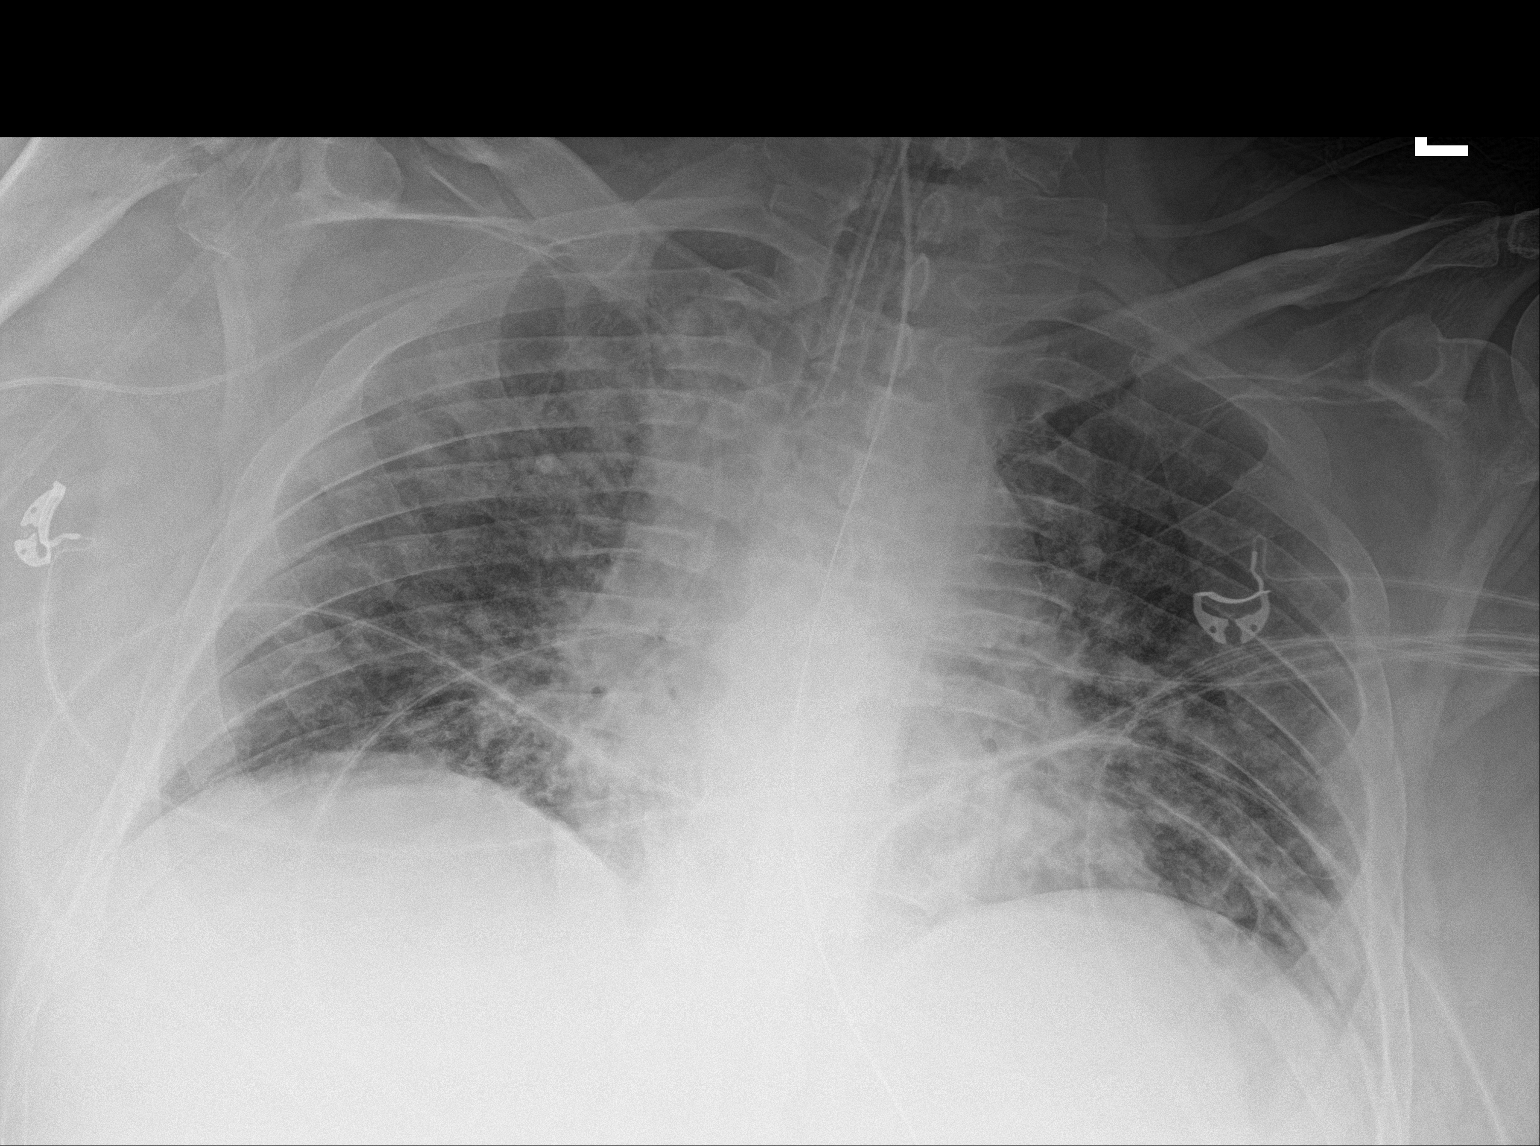

[1 of 1 positions shown; findings below may reference images not displayed]

FINDINGS: Tip of endotracheal tube projects 3.7 cm above carina.

Nasogastric tube extends into stomach.

RIGHT arm PICC line tip projects over cavoatrial junction.

Stable heart size mediastinal contours for technique.

Patchy BILATERAL airspace infiltrates consistent with multifocal
pneumonia, minimally improved.

No pleural effusion or pneumothorax.
IMPRESSION: Minimally improved pulmonary infiltrates.

## 2019-10-13 IMAGING — DX DG FOOT 2V*L*
2 series · 2 of 2 positions shown · non-contrast
Comparison: Left foot radiograph dated [DATE].

CLINICAL DATA: 49-year-old male with diabetic foot.

EXAM:
LEFT FOOT - 2 VIEW

[foot]
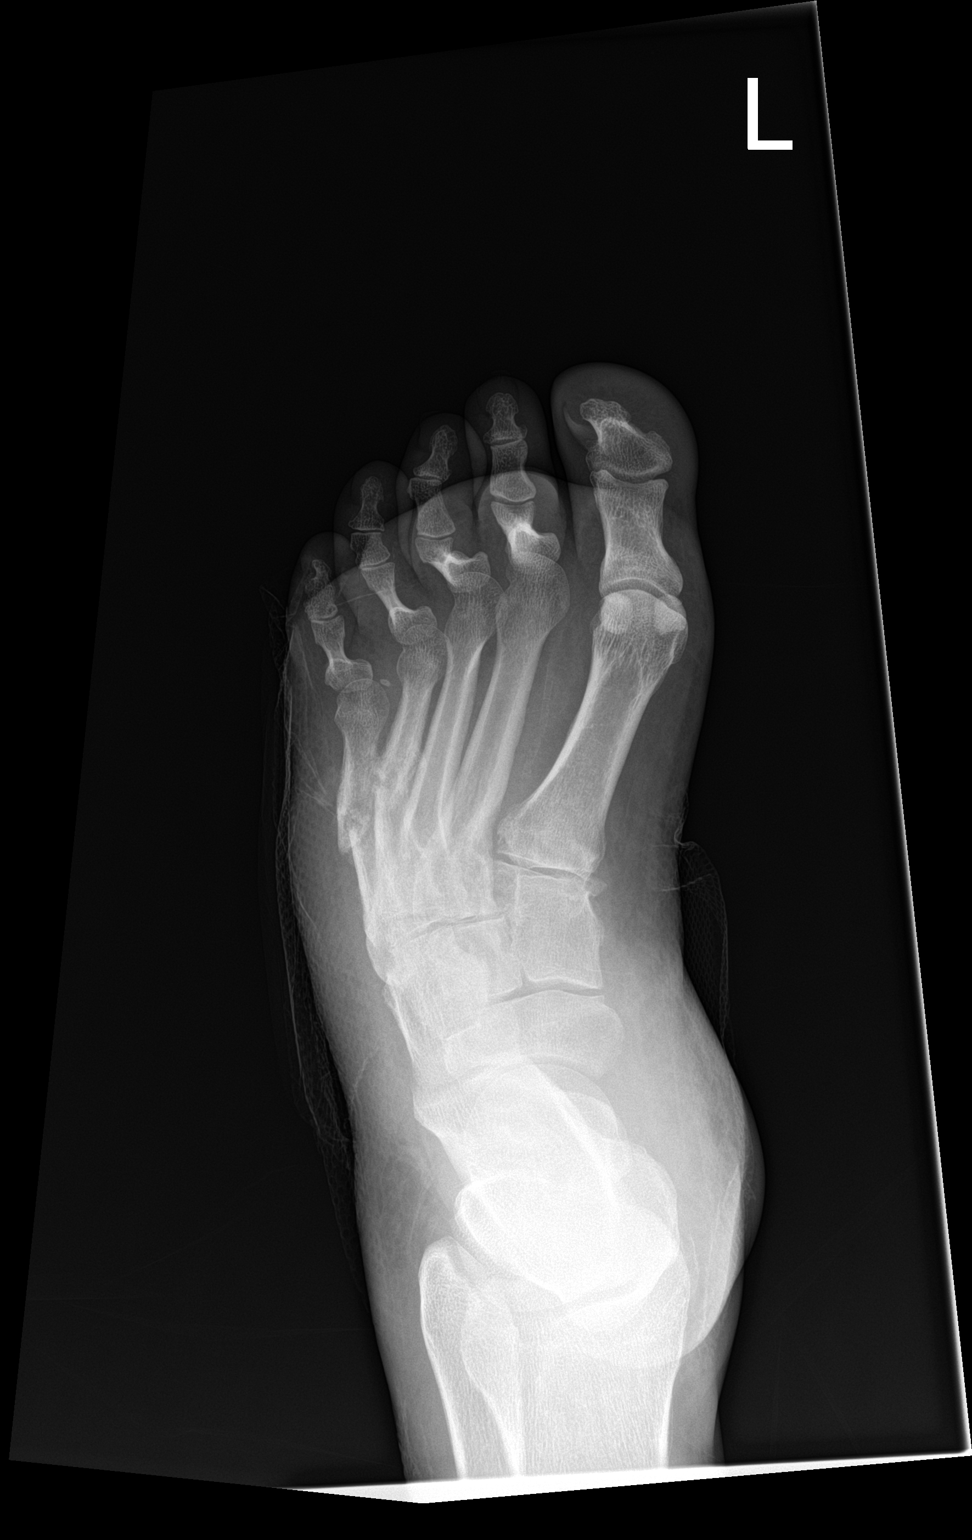

[leg]
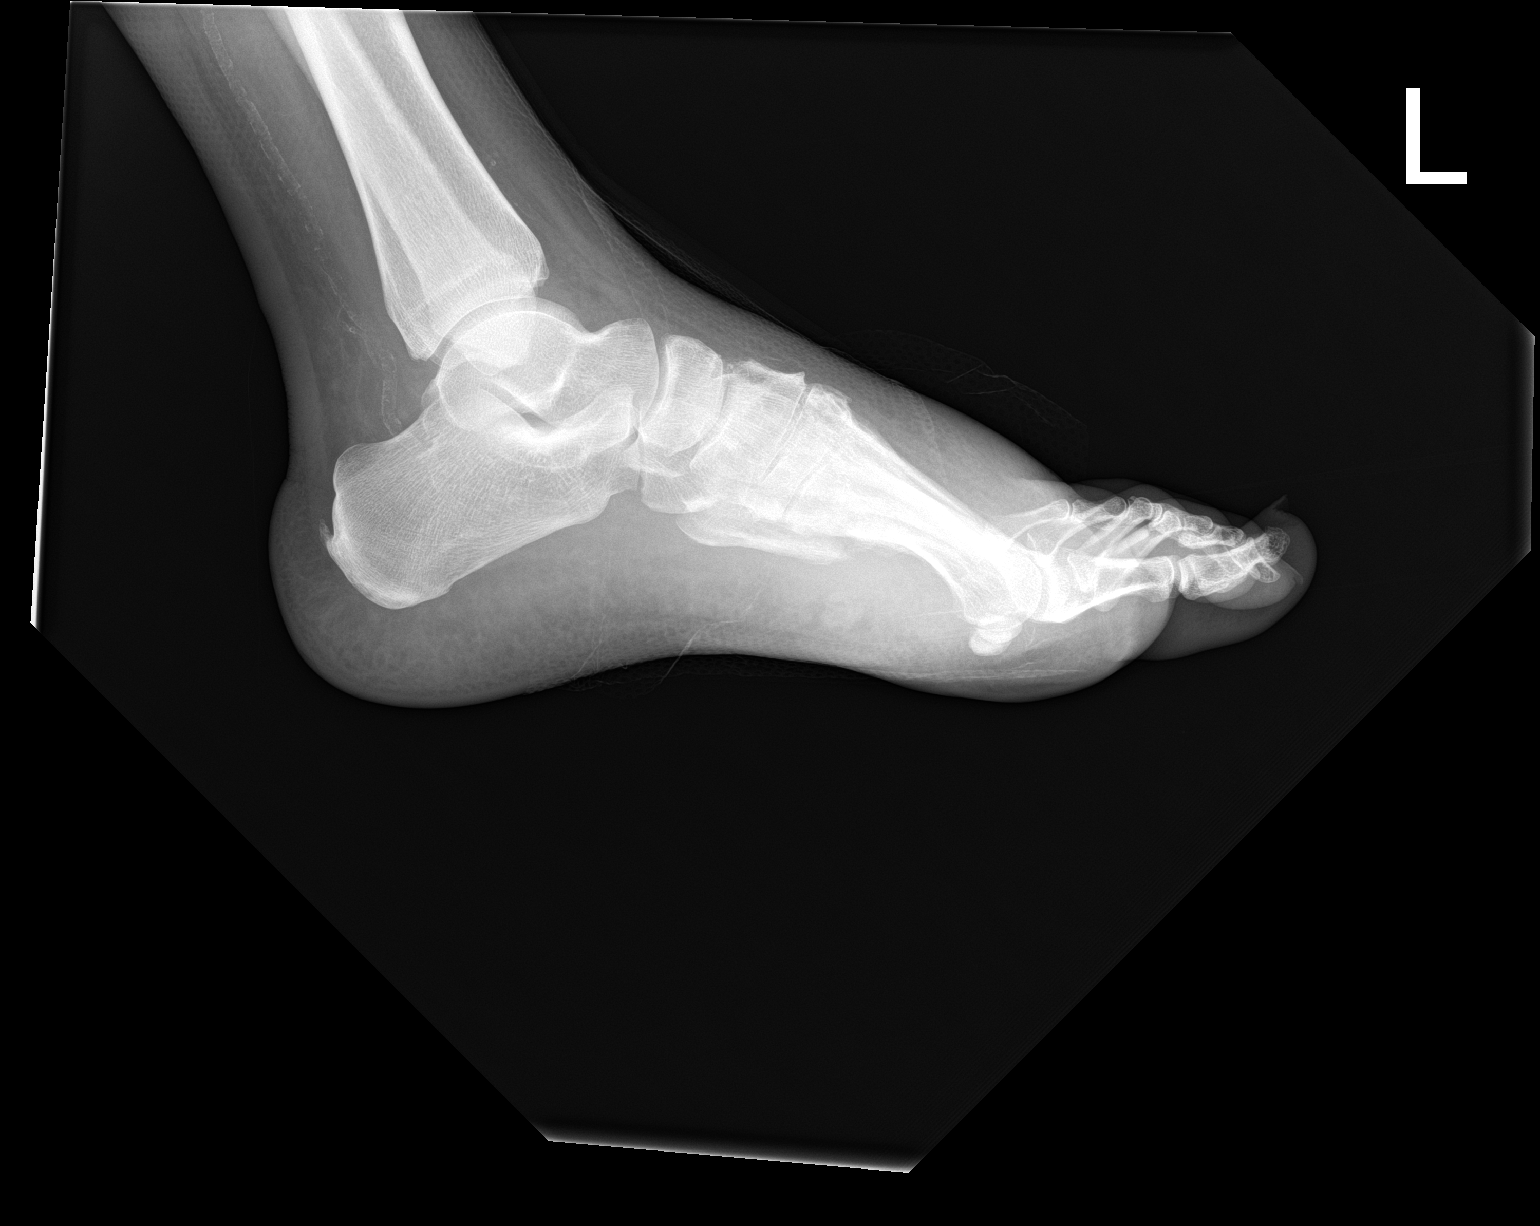

[2 of 2 positions shown; findings below may reference images not displayed]

FINDINGS: Evaluation is limited due to positioning. Fractures of the
midportion of the fourth and fifth metacarpal with interval
progression of bridging callus formation. No new fracture
identified. There is no dislocation. There is diffuse soft tissue
swelling of the foot. No radiopaque foreign object or soft tissue
gas.
IMPRESSION: 1. Healing fractures of the fourth and fifth metacarpal.
2. Diffuse soft tissue swelling. No radiopaque foreign object or
soft tissue gas.

## 2019-10-13 MED ORDER — ENOXAPARIN SODIUM 60 MG/0.6ML ~~LOC~~ SOLN
60.0000 mg | Freq: Every day | SUBCUTANEOUS | Status: DC
  Administered 2019-10-14 – 2019-10-16 (×3): 60 mg via SUBCUTANEOUS
  Filled 2019-10-13 (×3): qty 0.6

## 2019-10-13 MED ORDER — FUROSEMIDE 10 MG/ML IJ SOLN
40.0000 mg | Freq: Four times a day (QID) | INTRAMUSCULAR | Status: AC
  Administered 2019-10-13 (×2): 40 mg via INTRAVENOUS
  Filled 2019-10-13 (×2): qty 4

## 2019-10-13 MED ORDER — DEXTROSE 10 % IV SOLN: INTRAVENOUS | Status: DC | PRN

## 2019-10-13 MED ORDER — POTASSIUM CHLORIDE 10 MEQ/50ML IV SOLN
10.0000 meq | INTRAVENOUS | Status: AC
  Administered 2019-10-13 (×3): 10 meq via INTRAVENOUS
  Filled 2019-10-13 (×3): qty 50

## 2019-10-13 MED ORDER — POTASSIUM CHLORIDE 10 MEQ/100ML IV SOLN
INTRAVENOUS | Status: AC
  Filled 2019-10-13: qty 100

## 2019-10-13 MED ORDER — INSULIN DETEMIR 100 UNIT/ML ~~LOC~~ SOLN
38.0000 [IU] | Freq: Two times a day (BID) | SUBCUTANEOUS | Status: DC
  Administered 2019-10-13: 38 [IU] via SUBCUTANEOUS
  Filled 2019-10-13 (×2): qty 0.38

## 2019-10-13 MED ORDER — INSULIN ASPART 100 UNIT/ML ~~LOC~~ SOLN
3.0000 [IU] | SUBCUTANEOUS | Status: DC
  Administered 2019-10-13 – 2019-10-14 (×3): 9 [IU] via SUBCUTANEOUS

## 2019-10-13 MED ORDER — MIDAZOLAM 50MG/50ML (1MG/ML) PREMIX INFUSION
0.5000 mg/h | INTRAVENOUS | Status: DC
  Administered 2019-10-13: 10 mg/h via INTRAVENOUS
  Administered 2019-10-13: 3 mg/h via INTRAVENOUS
  Administered 2019-10-13: 5 mg/h via INTRAVENOUS
  Administered 2019-10-14 – 2019-10-15 (×6): 10 mg/h via INTRAVENOUS
  Filled 2019-10-13 (×9): qty 50

## 2019-10-13 MED ORDER — INSULIN ASPART 100 UNIT/ML ~~LOC~~ SOLN
2.0000 [IU] | SUBCUTANEOUS | Status: DC
  Administered 2019-10-13 (×2): 4 [IU] via SUBCUTANEOUS

## 2019-10-13 MED ORDER — POTASSIUM CHLORIDE 20 MEQ/15ML (10%) PO SOLN
40.0000 meq | Freq: Once | ORAL | Status: AC
  Administered 2019-10-13: 40 meq
  Filled 2019-10-13: qty 30

## 2019-10-13 MED ORDER — INSULIN ASPART 100 UNIT/ML ~~LOC~~ SOLN
15.0000 [IU] | Freq: Once | SUBCUTANEOUS | Status: AC
  Administered 2019-10-13: 15 [IU] via INTRAVENOUS

## 2019-10-13 MED ORDER — INSULIN ASPART 100 UNIT/ML ~~LOC~~ SOLN
13.0000 [IU] | SUBCUTANEOUS | Status: DC
  Administered 2019-10-13 (×3): 13 [IU] via SUBCUTANEOUS

## 2019-10-13 MED ORDER — INSULIN ASPART 100 UNIT/ML ~~LOC~~ SOLN
13.0000 [IU] | SUBCUTANEOUS | Status: DC
  Administered 2019-10-13 – 2019-10-14 (×4): 13 [IU] via SUBCUTANEOUS

## 2019-10-13 NOTE — Progress Notes (Signed)
Pts head turned to the left and arms rotated. ETT secured throughout.

## 2019-10-13 NOTE — Progress Notes (Signed)
Assisted tele visit to patient with mother & sister.  Offie Pickron DNP eLink RN

## 2019-10-13 NOTE — Progress Notes (Signed)
Pt's head turned to the right and arms rotated. ETT secured throughout. 

## 2019-10-13 NOTE — Progress Notes (Signed)
[facility] ADULT ICU REPLACEMENT PROTOCOL   The patient does apply for the [facility] Adult ICU Electrolyte Replacment Protocol based on the criteria listed below:   1. Is GFR >/= 30 ml/min? Yes.    Patient's GFR today is >60 2. Is SCr </= 2? Yes.   Patient's SCr is 1.11 ml/kg/hr 3. Did SCr increase >/= 0.5 in 24 hours? No. 4. Abnormal electrolyte(s): K+ 2.8 5. Ordered repletion with: Protocol 6. If a panic level lab has been reported, has the CCM MD in charge been notified? Yes.  .   Physician:  Reyne Dumas 10/13/2019 6:44 AM

## 2019-10-13 NOTE — Progress Notes (Signed)
Patient placed supine. Tolerated well. Vitals are stable. ABG to be drawn at 11:30. RT will continue to monitor.

## 2019-10-13 NOTE — Progress Notes (Signed)
NAME:  Micheal Bell, MRN:  [id number], DOB:  Sep 11, 1970, LOS: 1 ADMISSION DATE:  10/09/2019, CONSULTATION DATE:  8/19  REFERRING MD:  Leafy Half, CHIEF COMPLAINT:  COVID 41   Brief History   49 year old male admitted 8/16 for COVID 19 pneumonia. 8/19 early AM decompensated requiring up to 70 L HHFNC and NRB with O2 sats in the 70s and complaints of "wearing out" PCCM consulted.  Also treated for staph bacteremia and UTI. Partially vaccinated, first dose of vaccine was on 8/10. Developed cold symptoms on 8/11.  Past Medical History  Hypertension Gout DM2 CHF Asthma Irritable bowel syndrome fibromyalgia   Significant Hospital Events   8/16 present to [facility], COVID pos on 4L 8/19 transfer to [facility] on 70L HF and NRB, transferred to ICU due to respiratory fatigue, intubated  Consults:    Procedures:  8/19 ETT >  8/19 R arm PICC >   Significant Diagnostic Tests:  8/19 TTE > LVEF 30-35%  Micro Data:  8/16 SARS COV 2 > positive 8/16 Urine > MSSA 8/16 Blood > staph aureus 2/2, group B strep 2/2 8/19 blood >   Antimicrobials/COVID Rx:  8/16 remdesivir >  8/19 Baricitinib >  8/19 solumedrol >   ========  8/17 ceftriaxone >  8/17 vanc >   Interim history/subjective:    Off insulin drip K low Not on vasopressors   Objective   Blood pressure 107/73, pulse 69, temperature 97.6 F (36.4 C), temperature source Oral, resp. rate (!) 22, height 6\' 2"  (1.88 m), weight 127.2 kg, SpO2 99 %. CVP:  [20 mmHg] 20 mmHg  Vent Mode: PRVC FiO2 (%):  [70 %-100 %] 70 % Set Rate:  [22 bmp] 22 bmp Vt Set:  [580 mL] 580 mL PEEP:  [16 cmH20] 16 cmH20 Plateau Pressure:  [30 cmH20-33 cmH20] 30 cmH20   Intake/Output Summary (Last 24 hours) at 10/13/2019 0735 Last data filed at 10/13/2019 0700 Gross per 24 hour  Intake 4856.67 ml  Output 1700 ml  Net 3156.67 ml   Filed Weights   10/10/19 2211 10/13/19 0341  Weight: 111.1 kg 127.2 kg    Examination:  General:  In bed on  vent HENT: NCAT ETT in place PULM: Crackles bases  B, vent supported breathing CV: RRR, no mgr GI: BS+, soft, nontender MSK: normal bulk and tone Neuro: sedated on vent  8/20 CXR > bilateral airspace disease, ETT in place  Resolved Hospital Problem list     Assessment & Plan:  ARDS due to COVID 19 Continue mechanical ventilation per ARDS protocol Target TVol 6-8cc/kgIBW Target Plateau Pressure < 30cm H20 Target driving pressure less than 15 cm of water Target PaO2 55-65: titrate PEEP/FiO2 per protocol As long as PaO2 to FiO2 ratio is less than 1:150 position in prone position for 16 hours a day Check CVP daily if CVL in place Target CVP less than 4, diurese as necessary Ventilator associated pneumonia prevention protocol 8/20 continue solumedrol, remdesivir, baricitinib, lasix, prone again today  Severe ventilator dyssynchrony causing profound hypoxemia Continue neuromuscular blockade  RASS target -5 Fentanyl, propofol per NMB protocol  Acute on chronic systolic CHF Lasix today Tele  Staph aureus bacteremia and UTI Left foot wound: stepped on a nail 2-3 weeks prior to admission, diabetic foot Check X-ray foot F/u repeat cultures Continue cefazolin Wound care Will need MRI of foot at some point  Asthma without acute exacerbation Albuterol   Hypokalemia Hyponatremia> 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 Free water restrict Replace K  Monitor BMET and UOP Replace electrolytes as needed  DM2 Continue SSI  Add tube feeding coverage   Best practice:  Diet: start tube feeding Pain/Anxiety/Delirium protocol (if indicated): as VAP protocol (if indicated): yes DVT prophylaxis: lovenox GI prophylaxis: start famotidine Glucose control: as above Mobility: bed rest Code Status: full Family Communication: I called his sister Rosey Bath for an update on 8/20 Disposition: remain in ICU  Labs   CBC: Recent Labs  Lab 10/09/19 1900 10/09/19 1900 10/12/19 0353  10/12/19 0423 10/12/19 0511 10/12/19 0752 10/13/19 0340  WBC 9.1  --   --  15.5*  --   --  13.7*  NEUTROABS 8.1*  --   --  14.2*  --   --  PENDING  HGB 15.1   < > 12.6* 15.1 12.9* 14.3 12.1*  HCT 43.6   < > 37.0* 43.2 38.0* 42.0 35.0*  MCV 88.1  --   --  89.3  --   --  90.0  PLT 139*  --   --  151  --   --  142*   < > = values in this interval not displayed.    Basic Metabolic Panel: Recent Labs  Lab 10/09/19 1900 10/09/19 1900 10/10/19 1411 10/10/19 1411 10/12/19 0353 10/12/19 0423 10/12/19 0511 10/12/19 0752 10/12/19 1530 10/13/19 0340  NA 125*   < > 130*   < > 129* 130* 128* 129*  --  128*  K 2.9*   < > 3.2*   < > 3.2* 3.1* 3.0* 3.4*  --  2.8*  CL 82*  --  92*  --   --  89*  --   --   --  94*  CO2 27  --  25  --   --  24  --   --   --  23  GLUCOSE 464*  --  177*  --   --  253*  --   --   --  168*  BUN 23*  --  17  --   --  10  --   --   --  28*  CREATININE 1.04  --  0.91  --   --  0.91  --   --   --  1.11  CALCIUM 8.6*  --  7.7*  --   --  8.4*  --   --   --  7.5*  MG  --   --  1.0*  --   --  1.6*  --   --  2.5* 2.6*  PHOS  --   --   --   --   --  2.1*  --   --  5.3* 4.3   < > = values in this interval not displayed.   GFR: Estimated Creatinine Clearance: 111.6 mL/min (by C-G formula based on SCr of 1.11 mg/dL). Recent Labs  Lab 10/09/19 1900 10/09/19 2200 10/10/19 0115 10/10/19 2227 10/11/19 0525 10/12/19 0423 10/12/19 0525 10/12/19 1616 10/13/19 0340  PROCALCITON  --   --  1.52  --   --   --   --   --   --   WBC 9.1  --   --   --   --  15.5*  --   --  13.7*  LATICACIDVEN 2.4*   < >  --  2.0* 2.0*  --  2.1* 1.7  --    < > = values in this interval not displayed.    Liver Function Tests: Recent Labs  Lab 10/09/19 1900 10/12/19 0423 10/13/19 0340  AST 47* 67* 48*  ALT 33 36 23  ALKPHOS 92 115 87  BILITOT 0.9 1.3* 0.8  PROT 7.6 6.7 5.5*  ALBUMIN 3.6 2.3* 1.9*   No results for input(s): LIPASE, AMYLASE in the last 168 hours. No results for  input(s): AMMONIA in the last 168 hours.  ABG    Component Value Date/Time   PHART 7.268 (L) 10/12/2019 0752   PCO2ART 57.6 (H) 10/12/2019 0752   PO2ART 71 (L) 10/12/2019 0752   HCO3 26.2 10/12/2019 0752   TCO2 28 10/12/2019 0752   ACIDBASEDEF 2.0 10/12/2019 0752   O2SAT 69.3 10/12/2019 1245     Coagulation Profile: Recent Labs  Lab 10/12/19 0423  INR 1.1    Cardiac Enzymes: No results for input(s): CKTOTAL, CKMB, CKMBINDEX, TROPONINI in the last 168 hours.  HbA1C: Hgb A1c MFr Bld  Date/Time Value Ref Range Status  10/10/2019 01:15 AM 11.6 (H) 4.8 - 5.6 % Final    Comment:    (NOTE) Pre diabetes:          5.7%-6.4%  Diabetes:              >6.4%  Glycemic control for   <7.0% adults with diabetes     CBG: Recent Labs  Lab 10/13/19 0129 10/13/19 0227 10/13/19 0336 10/13/19 0447 10/13/19 0550  GLUCAP 189* 181* 196* 160* 185*    Critical care time: 35 minutes     Heber Bethany, MD [place] [facility] Pager: [phone] Cell: [phone] If no response, call [phone]

## 2019-10-13 NOTE — Progress Notes (Signed)
eLink Physician-Brief Progress Note Patient Name: Micheal Bell DOB: [birth year] MRN: [id number]   Date of Service  10/13/2019  HPI/Events of Note  Patient meets criteria for transition off Insulin infusion.  eICU Interventions  Hyperglycemia phase 3 orders (Transition) entered.        Thomasene Lot Micheal Bell 10/13/2019, 5:50 AM

## 2019-10-13 NOTE — Progress Notes (Signed)
[facility]ious [facility]    Date of Admission:  10/09/2019   Total days of antibiotics 5  ID: CY BRESEE is a 49 y.o. male with MSSA bacteremia, 2/2 DFU to left foot Active Problems:   COVID-19 virus infection   Hyperglycemia   Hypoxia    Subjective: Afebrile, remains ventilated on 70%FIO2. Leukocytosis slightly improved  Medications:   artificial tears  1 application Both Eyes [id number]   baricitinib  4 mg Oral Daily   chlorhexidine gluconate (MEDLINE KIT)  15 mL Mouth Rinse BID   Chlorhexidine Gluconate Cloth  6 each Topical Daily   [START ON 10/14/2019] enoxaparin (LOVENOX) injection  60 mg Subcutaneous Daily   feeding supplement (PROSource TF)  45 mL Per Tube QID   furosemide  40 mg Intravenous Q6H   insulin aspart  13 Units Subcutaneous Q4H   insulin aspart  3-9 Units Subcutaneous Q4H   insulin detemir  38 Units Subcutaneous BID   ipratropium-albuterol  3 mL Nebulization Q6H   mouth rinse  15 mL Mouth Rinse 10 times per day   methylPREDNISolone (SOLU-MEDROL) injection  60 mg Intravenous Q8H   sodium chloride flush  10-40 mL Intracatheter Q12H    Objective: Vital signs in last 24 hours: Temp:  [94 F (34.4 C)-97.7 F (36.5 C)] 97.6 F (36.4 C) (08/20 1555) Pulse Rate:  [62-78] 70 (08/20 1200) Resp:  [22] 22 (08/20 1200) BP: (94-125)/(64-84) 112/82 (08/20 1200) SpO2:  [89 %-100 %] 94 % (08/20 1459) FiO2 (%):  [70 %-80 %] 70 % (08/20 1500) Weight:  [127.2 kg] 127.2 kg (08/20 0341)  Physical Exam  Constitutional: He is intubated and sedated.He appears well-developed, obese, and well-nourished. No distress.  HENT:  Mouth/Throat: OETT No oropharyngeal exudate.  Cardiovascular: tachy, regular rhythm and normal heart sounds. Exam reveals no gallop and no friction rub.  No murmur heard.  Pulmonary/Chest: Effort normal and breath sounds normal. No respiratory distress. He has no wheezes.  Abdominal: Soft. Bowel sounds are normal. Mildly  distended WCH:ENID foot ulcer still draining purulent like exudate. Swollen with echymosis   Lab Results Recent Labs    10/12/19 0423 10/12/19 0511 10/13/19 0340 10/13/19 0340 10/13/19 1242 10/13/19 1419  WBC 15.5*  --  13.7*  --   --   --   HGB 15.1   < > 12.1*  --  11.2*  --   HCT 43.2   < > 35.0*  --  33.0*  --   NA 130*   < > 128*   < > 133* 137  K 3.1*   < > 2.8*   < > 4.0 3.4*  CL 89*   < > 94*  --   --  103  CO2 24   < > 23  --   --  22  BUN 10   < > 28*  --   --  32*  CREATININE 0.91   < > 1.11  --   --  1.00   < > = values in this interval not displayed.   Liver Panel Recent Labs    10/12/19 0423 10/13/19 0340  PROT 6.7 5.5*  ALBUMIN 2.3* 1.9*  AST 67* 48*  ALT 36 23  ALKPHOS 115 87  BILITOT 1.3* 0.8   Sedimentation Rate No results for input(s): ESRSEDRATE in the last 72 hours. C-Reactive Protein Recent Labs    10/12/19 0423  CRP 34.4*    Microbiology: 8/19 blood cxNGTD 8/16 blood cx MSSA  Studies/Results: DG Chest Port 1 View  Result Date: 10/13/2019 CLINICAL DATA:  Acute respiratory failure with hypoxemia, COVID positive, asthma, diabetes mellitus, hypertension, CHF EXAM: PORTABLE CHEST 1 VIEW COMPARISON:  Portable exam 1021 hours compared to 10/12/2019 FINDINGS: Tip of endotracheal tube projects 3.7 cm above carina. Nasogastric tube extends into stomach. RIGHT arm PICC line tip projects over cavoatrial junction. Stable heart size mediastinal contours for technique. Patchy BILATERAL airspace infiltrates consistent with multifocal pneumonia, minimally improved. No pleural effusion or pneumothorax. IMPRESSION: Minimally improved pulmonary infiltrates. Electronically Signed   By: Lavonia Dana M.D.   On: 10/13/2019 10:37   DG CHEST PORT 1 VIEW  Result Date: 10/12/2019 CLINICAL DATA:  Intubation EXAM: PORTABLE CHEST 1 VIEW COMPARISON:  Earlier today FINDINGS: New endotracheal tube with tip at the clavicular heads, measuring 3 cm above the carina. Low volume  chest with severe airspace disease. No visible effusion or air leak. Stable heart size which is distorted by positioning and volumes. IMPRESSION: 1. Unremarkable positioning of the endotracheal tube. 2. Stable extensive airspace disease with low lung volumes. Electronically Signed   By: Monte Fantasia M.D.   On: 10/12/2019 07:13   DG CHEST PORT 1 VIEW  Result Date: 10/12/2019 CLINICAL DATA:  Shortness of breath EXAM: PORTABLE CHEST 1 VIEW COMPARISON:  Three days ago FINDINGS: Very low volume chest with diffuse and patchy pulmonary opacity. Vascular pedicle widening and prominent heart size largely related to technique. No visible effusion or pneumothorax. IMPRESSION: Worsening multifocal pneumonia.  Lung volumes remain very low. Electronically Signed   By: Monte Fantasia M.D.   On: 10/12/2019 04:07   DG Abd Portable 1V  Result Date: 10/12/2019 CLINICAL DATA:  Onychogryphosis EXAM: PORTABLE ABDOMEN - 1 VIEW COMPARISON:  Portable exam 0830 hours without priors for comparison FINDINGS: Tip of nasogastric tube projects over distal antrum. Nonobstructive bowel gas pattern. No bowel dilatation or bowel wall thickening. Patchy bibasilar infiltrates. No acute osseous findings. IMPRESSION: Tip of nasogastric tube projects over distal gastric antrum. Nonobstructive bowel gas pattern. Bibasilar pulmonary infiltrates. Electronically Signed   By: Lavonia Dana M.D.   On: 10/12/2019 08:51   DG Foot 2 Views Left  Result Date: 10/13/2019 CLINICAL DATA:  49 year old male with diabetic foot. EXAM: LEFT FOOT - 2 VIEW COMPARISON:  Left foot radiograph dated 09/11/2019. FINDINGS: Evaluation is limited due to positioning. Fractures of the midportion of the fourth and fifth metacarpal with interval progression of bridging callus formation. No new fracture identified. There is no dislocation. There is diffuse soft tissue swelling of the foot. No radiopaque foreign object or soft tissue gas. IMPRESSION: 1. Healing fractures of  the fourth and fifth metacarpal. 2. Diffuse soft tissue swelling. No radiopaque foreign object or soft tissue gas. Electronically Signed   By: Anner Crete M.D.   On: 10/13/2019 16:49   ECHOCARDIOGRAM COMPLETE  Result Date: 10/12/2019    ECHOCARDIOGRAM REPORT   Patient Name:   Micheal Bell Date of Exam: 10/12/2019 Medical Rec #:  [id number]      Height:       74.0 in Accession #:    [id number]     Weight:       245.0 lb Date of Birth:  [birth year]      BSA:          2.369 m Patient Age:    6 years       BP:           111/74 mmHg Patient Gender: M  HR:           75 bpm. Exam Location:  Inpatient Procedure: 2D Echo Indications:    CHF 428                  & Bacteremia 790.7  History:        Patient has no prior history of Echocardiogram examinations.                 CHF; Risk Factors:Hypertension and Diabetes. Covid +.  Sonographer:    Jannett Celestine RDCS (AE) Referring Phys: [id number] Silvestre Moment [facility]  Sonographer Comments: Echo performed with patient supine and on artificial respirator. Image acquisition challenging due to respiratory motion and Image acquisition challenging due to patient body habitus. restricted mobility IMPRESSIONS  1. Extremely poor acoustic windows. LVEF appears to be depressed with diffuse hypokinesis, worse in the mid/distal inferior/inferoseptal walls. . Left ventricular ejection fraction, by estimation, is 30 to 35%. The left ventricle has moderately decreased function. The left ventricular internal cavity size was mildly dilated. Left ventricular diastolic parameters are indeterminate.  2. Right ventricular systolic function is moderately reduced. The right ventricular size is mildly enlarged.  3. The mitral valve is normal in structure. Trivial mitral valve regurgitation.  4. The aortic valve is normal in structure. Aortic valve regurgitation is not visualized. FINDINGS  Left Ventricle: Extremely poor acoustic windows. LVEF appears to be depressed with diffuse hypokinesis,  worse in the mid/distal inferior/inferoseptal walls. Left ventricular ejection fraction, by estimation, is 30 to 35%. The left ventricle has moderately decreased function. The left ventricle demonstrates regional wall motion abnormalities. The left ventricular internal cavity size was mildly dilated. There is no left ventricular hypertrophy. Left ventricular diastolic parameters are indeterminate. Right Ventricle: The right ventricular size is mildly enlarged. Right vetricular wall thickness was not assessed. Right ventricular systolic function is moderately reduced. Left Atrium: Left atrial size was normal in size. Right Atrium: Right atrial size was normal in size. Pericardium: There is no evidence of pericardial effusion. Mitral Valve: The mitral valve is normal in structure. Trivial mitral valve regurgitation. Tricuspid Valve: The tricuspid valve is normal in structure. Tricuspid valve regurgitation is trivial. Aortic Valve: The aortic valve is normal in structure. Aortic valve regurgitation is not visualized. Pulmonic Valve: The pulmonic valve was grossly normal. Pulmonic valve regurgitation is trivial. Aorta: The aortic root is normal in size and structure. IAS/Shunts: The interatrial septum was not assessed.  LEFT VENTRICLE PLAX 2D LVIDd:         5.60 cm  Diastology LVIDs:         3.90 cm  LV e' lateral:   8.81 cm/s LV PW:         1.40 cm  LV E/e' lateral: 5.7 LV IVS:        1.10 cm  LV e' medial:    5.87 cm/s LVOT diam:     2.40 cm  LV E/e' medial:  8.5 LV SV:         64 LV SV Index:   27 LVOT Area:     4.52 cm  RIGHT VENTRICLE RV S prime:     9.79 cm/s TAPSE (M-mode): 1.3 cm LEFT ATRIUM             Index       RIGHT ATRIUM           Index LA diam:        3.10 cm 1.31 cm/m  RA Area:  18.80 cm LA Vol (A2C):   62.9 ml 26.55 ml/m RA Volume:   51.80 ml  21.86 ml/m LA Vol (A4C):   40.8 ml 17.22 ml/m LA Biplane Vol: 50.8 ml 21.44 ml/m  AORTIC VALVE LVOT Vmax:   69.80 cm/s LVOT Vmean:  49.400 cm/s LVOT  VTI:    0.142 m  AORTA Ao Root diam: 3.60 cm MITRAL VALVE MV Area (PHT): 2.34 cm    SHUNTS MV Decel Time: 324 msec    Systemic VTI:  0.14 m MV E velocity: 50.10 cm/s  Systemic Diam: 2.40 cm MV A velocity: 71.60 cm/s MV E/A ratio:  0.70 Dorris Carnes MD Electronically signed by Dorris Carnes MD Signature Date/Time: 10/12/2019/1:58:41 PM    Final    [place] EKG SITE RITE  Result Date: 10/12/2019 If Site Rite image not attached, placement could not be confirmed due to current cardiac rhythm.    Assessment/Plan: MSSA bacteremia likely for DFU/wound = continue on cefazolin 2 gm IV Q 8hr. Once he is stabilized, recommend further imaging. On plain xray appears fractured 4th and 5th MT TTE is negative, once stable, would recommend TEE Will need to follow up on cutlures to see he has cleared his bacteremia  Severe covid illness with respiratory failure = continue with remdesivir, steroids, and baricitinib. pulm critical care guiding management.  Dr Tommy Medal available over the weekend. Will see on [facility] for Infect[facility] Cell: [phone] Pager: [phone]  10/13/2019, 5:51 PM

## 2019-10-13 NOTE — Progress Notes (Signed)
RT NOTE:  Pts head turned and arms rotated. ETT stable throughout 

## 2019-10-14 ENCOUNTER — Inpatient Hospital Stay (HOSPITAL_COMMUNITY): Payer: Medicaid Other

## 2019-10-14 LAB — GLUCOSE, CAPILLARY
Glucose-Capillary: 135 mg/dL — ABNORMAL HIGH (ref 70–99)
Glucose-Capillary: 140 mg/dL — ABNORMAL HIGH (ref 70–99)
Glucose-Capillary: 143 mg/dL — ABNORMAL HIGH (ref 70–99)
Glucose-Capillary: 144 mg/dL — ABNORMAL HIGH (ref 70–99)
Glucose-Capillary: 153 mg/dL — ABNORMAL HIGH (ref 70–99)
Glucose-Capillary: 153 mg/dL — ABNORMAL HIGH (ref 70–99)
Glucose-Capillary: 163 mg/dL — ABNORMAL HIGH (ref 70–99)
Glucose-Capillary: 165 mg/dL — ABNORMAL HIGH (ref 70–99)
Glucose-Capillary: 172 mg/dL — ABNORMAL HIGH (ref 70–99)
Glucose-Capillary: 187 mg/dL — ABNORMAL HIGH (ref 70–99)
Glucose-Capillary: 250 mg/dL — ABNORMAL HIGH (ref 70–99)
Glucose-Capillary: 254 mg/dL — ABNORMAL HIGH (ref 70–99)
Glucose-Capillary: 271 mg/dL — ABNORMAL HIGH (ref 70–99)
Glucose-Capillary: 275 mg/dL — ABNORMAL HIGH (ref 70–99)
Glucose-Capillary: 325 mg/dL — ABNORMAL HIGH (ref 70–99)
Glucose-Capillary: 375 mg/dL — ABNORMAL HIGH (ref 70–99)
Glucose-Capillary: 401 mg/dL — ABNORMAL HIGH (ref 70–99)
Glucose-Capillary: 417 mg/dL — ABNORMAL HIGH (ref 70–99)
Glucose-Capillary: 454 mg/dL — ABNORMAL HIGH (ref 70–99)
Glucose-Capillary: 465 mg/dL — ABNORMAL HIGH (ref 70–99)
Glucose-Capillary: 475 mg/dL — ABNORMAL HIGH (ref 70–99)
Glucose-Capillary: 480 mg/dL — ABNORMAL HIGH (ref 70–99)
Glucose-Capillary: 491 mg/dL — ABNORMAL HIGH (ref 70–99)

## 2019-10-14 LAB — CULTURE, BLOOD (SINGLE): Special Requests: ADEQUATE

## 2019-10-14 LAB — CBC WITH DIFFERENTIAL/PLATELET
Abs Immature Granulocytes: 0.45 10*3/uL — ABNORMAL HIGH (ref 0.00–0.07)
Basophils Absolute: 0 10*3/uL (ref 0.0–0.1)
Basophils Relative: 0 %
Eosinophils Absolute: 0 10*3/uL (ref 0.0–0.5)
Eosinophils Relative: 0 %
HCT: 33.5 % — ABNORMAL LOW (ref 39.0–52.0)
Hemoglobin: 11.5 g/dL — ABNORMAL LOW (ref 13.0–17.0)
Immature Granulocytes: 4 %
Lymphocytes Relative: 8 %
Lymphs Abs: 0.8 10*3/uL (ref 0.7–4.0)
MCH: 31 pg (ref 26.0–34.0)
MCHC: 34.3 g/dL (ref 30.0–36.0)
MCV: 90.3 fL (ref 80.0–100.0)
Monocytes Absolute: 0.5 10*3/uL (ref 0.1–1.0)
Monocytes Relative: 5 %
Neutro Abs: 9.1 10*3/uL — ABNORMAL HIGH (ref 1.7–7.7)
Neutrophils Relative %: 83 %
Platelets: 185 10*3/uL (ref 150–400)
RBC: 3.71 MIL/uL — ABNORMAL LOW (ref 4.22–5.81)
RDW: 12.4 % (ref 11.5–15.5)
Smear Review: ADEQUATE
WBC: 11.1 10*3/uL — ABNORMAL HIGH (ref 4.0–10.5)
nRBC: 0 % (ref 0.0–0.2)

## 2019-10-14 LAB — COMPREHENSIVE METABOLIC PANEL
ALT: 21 U/L (ref 0–44)
AST: 36 U/L (ref 15–41)
Albumin: 1.8 g/dL — ABNORMAL LOW (ref 3.5–5.0)
Alkaline Phosphatase: 92 U/L (ref 38–126)
Anion gap: 14 (ref 5–15)
BUN: 40 mg/dL — ABNORMAL HIGH (ref 6–20)
CO2: 27 mmol/L (ref 22–32)
Calcium: 7.8 mg/dL — ABNORMAL LOW (ref 8.9–10.3)
Chloride: 96 mmol/L — ABNORMAL LOW (ref 98–111)
Creatinine, Ser: 1.1 mg/dL (ref 0.61–1.24)
GFR calc Af Amer: >60 mL/min (ref >60)
GFR calc non Af Amer: >60 mL/min (ref >60)
Glucose, Bld: 517 mg/dL (ref 70–99)
Potassium: 3.1 mmol/L — ABNORMAL LOW (ref 3.5–5.1)
Sodium: 137 mmol/L (ref 135–145)
Total Bilirubin: 0.5 mg/dL (ref 0.3–1.2)
Total Protein: 5.5 g/dL — ABNORMAL LOW (ref 6.5–8.1)

## 2019-10-14 LAB — POCT I-STAT 7, (LYTES, BLD GAS, ICA,H+H)
Acid-Base Excess: 9 mmol/L — ABNORMAL HIGH (ref 0.0–2.0)
Bicarbonate: 35.1 mmol/L — ABNORMAL HIGH (ref 20.0–28.0)
Calcium, Ion: 1.15 mmol/L (ref 1.15–1.40)
HCT: 32 % — ABNORMAL LOW (ref 39.0–52.0)
Hemoglobin: 10.9 g/dL — ABNORMAL LOW (ref 13.0–17.0)
O2 Saturation: 98 %
Patient temperature: 98.7
Potassium: 3.7 mmol/L (ref 3.5–5.1)
Sodium: 143 mmol/L (ref 135–145)
TCO2: 37 mmol/L — ABNORMAL HIGH (ref 22–32)
pCO2 arterial: 56.7 mmHg — ABNORMAL HIGH (ref 32.0–48.0)
pH, Arterial: 7.4 (ref 7.350–7.450)
pO2, Arterial: 111 mmHg — ABNORMAL HIGH (ref 83.0–108.0)

## 2019-10-14 LAB — TRIGLYCERIDES: Triglycerides: 359 mg/dL — ABNORMAL HIGH (ref <150)

## 2019-10-14 IMAGING — DX DG CHEST 1V PORT
1 series · 1 of 1 positions shown · non-contrast
Comparison: [DATE]

CLINICAL DATA: [ZE], ARDS

EXAM:
PORTABLE CHEST 1 VIEW

[chest ap]
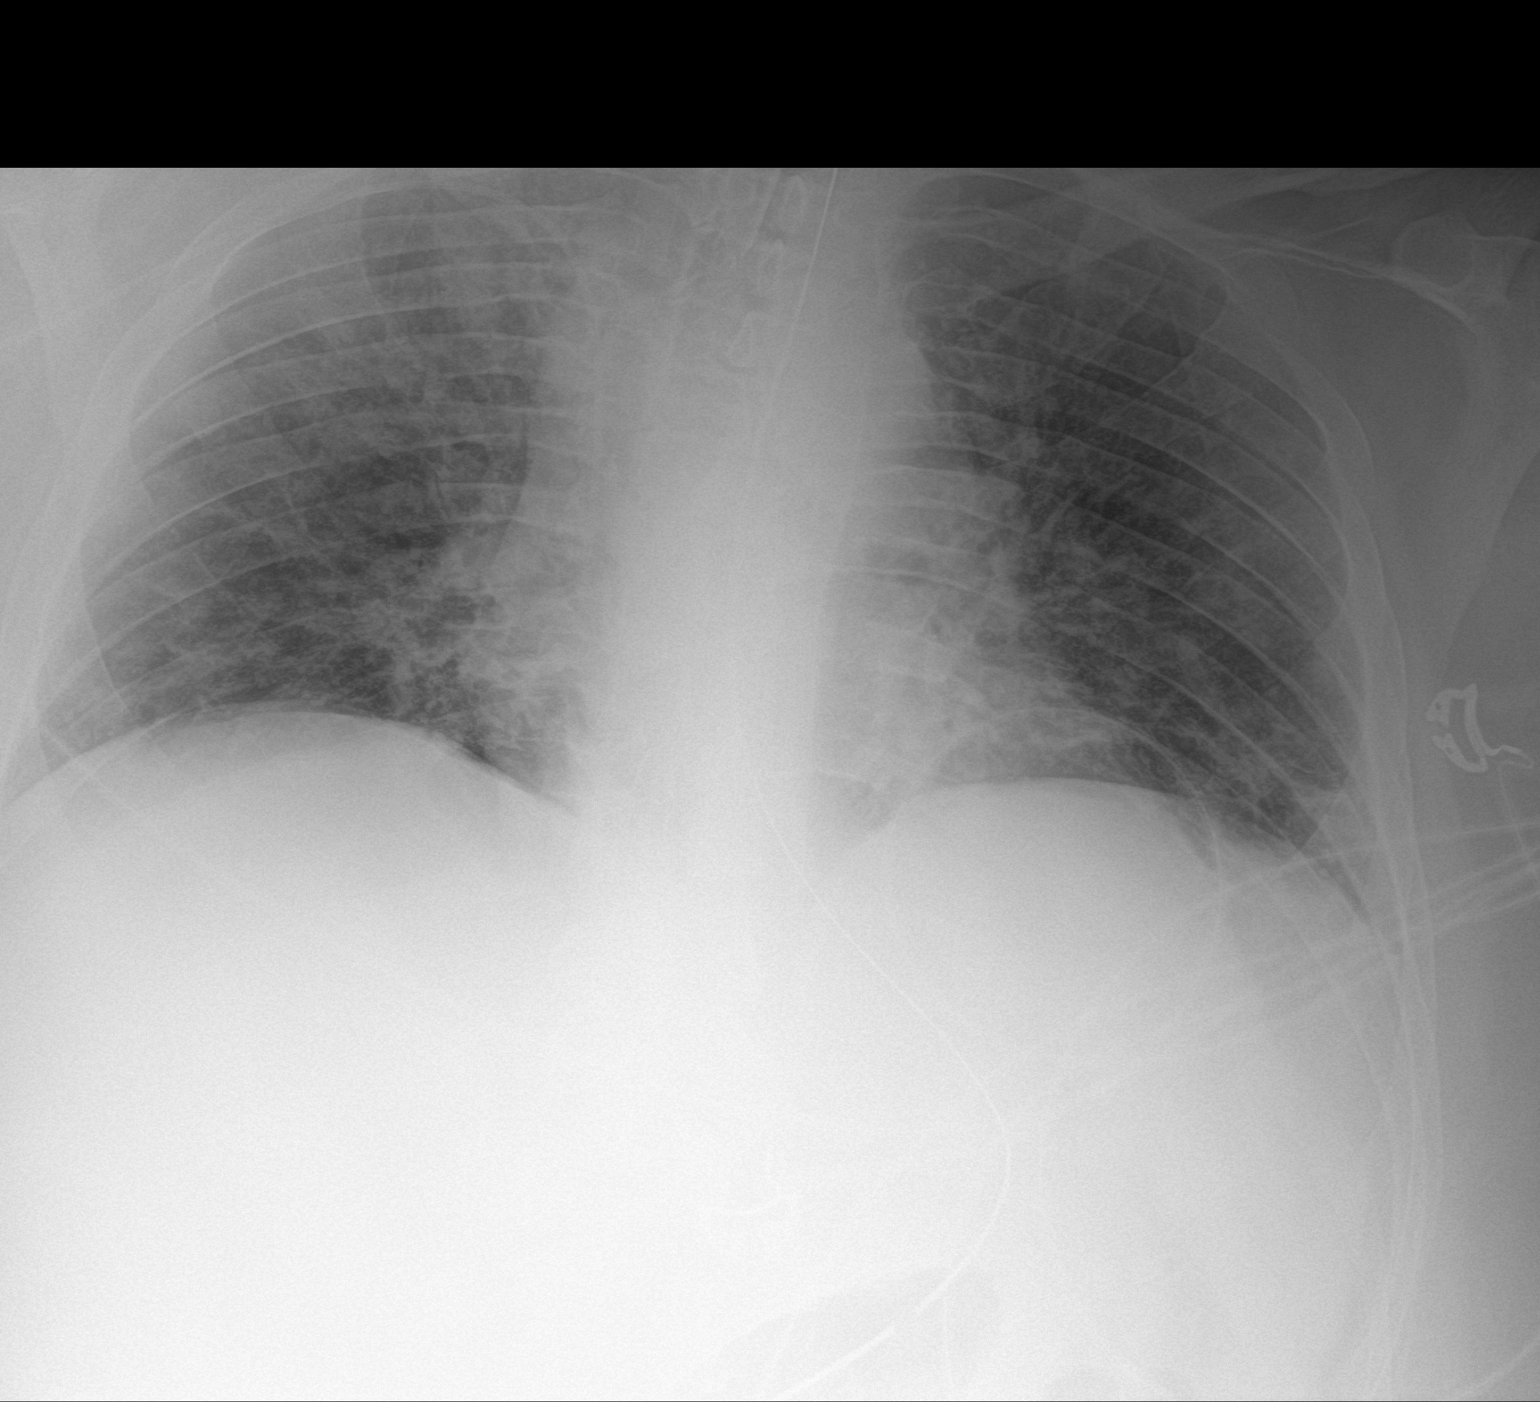

[1 of 1 positions shown; findings below may reference images not displayed]

FINDINGS: Single frontal view of the chest demonstrates stable position of the
endotracheal tube and enteric catheter. There is a right-sided PICC
tip overlying superior vena cava. The cardiac silhouette is stable.
Interstitial and ground-glass opacities are seen throughout the
lungs bilaterally, unchanged. Lung volumes are diminished. No
effusion or pneumothorax.
IMPRESSION: 1. Multifocal interstitial and ground-glass opacities, stable since
prior study, consistent with history of COVID 19 pneumonia and ARDS.
2. Stable support devices.

## 2019-10-14 MED ORDER — POTASSIUM CHLORIDE 10 MEQ/50ML IV SOLN
10.0000 meq | INTRAVENOUS | Status: AC
  Administered 2019-10-14 (×6): 10 meq via INTRAVENOUS
  Filled 2019-10-14 (×6): qty 50

## 2019-10-14 MED ORDER — POTASSIUM CHLORIDE 20 MEQ/15ML (10%) PO SOLN
40.0000 meq | Freq: Once | ORAL | Status: AC
  Administered 2019-10-14: 40 meq
  Filled 2019-10-14: qty 30

## 2019-10-14 MED ORDER — DEXTROSE 50 % IV SOLN: 0.0000 mL | INTRAVENOUS | Status: DC | PRN

## 2019-10-14 MED ORDER — BISACODYL 10 MG RE SUPP: 10.0000 mg | Freq: Every day | RECTAL | Status: DC | PRN

## 2019-10-14 MED ORDER — FUROSEMIDE 10 MG/ML IJ SOLN
40.0000 mg | Freq: Four times a day (QID) | INTRAMUSCULAR | Status: AC
  Administered 2019-10-14 (×2): 40 mg via INTRAVENOUS
  Filled 2019-10-14 (×2): qty 4

## 2019-10-14 MED ORDER — SENNOSIDES 8.8 MG/5ML PO SYRP: 10.0000 mL | ORAL_SOLUTION | Freq: Every day | ORAL | Status: DC | PRN

## 2019-10-14 MED ORDER — POLYETHYLENE GLYCOL 3350 17 G PO PACK
17.0000 g | PACK | Freq: Every day | ORAL | Status: DC
  Filled 2019-10-14: qty 1

## 2019-10-14 MED ORDER — INSULIN REGULAR(HUMAN) IN NACL 100-0.9 UT/100ML-% IV SOLN
INTRAVENOUS | Status: DC
  Administered 2019-10-14: 6 [IU]/h via INTRAVENOUS
  Administered 2019-10-14: 29 [IU]/h via INTRAVENOUS
  Administered 2019-10-14: 24 [IU]/h via INTRAVENOUS
  Administered 2019-10-15: 1.8 [IU]/h via INTRAVENOUS
  Administered 2019-10-15: 5.5 [IU]/h via INTRAVENOUS
  Filled 2019-10-14 (×7): qty 100

## 2019-10-14 NOTE — Progress Notes (Signed)
Assisted tele visit to patient with mother and family member.  Sandria Mcenroe DNP eLink RN

## 2019-10-14 NOTE — Progress Notes (Signed)
NAME:  Micheal Bell, MRN:  [id number], DOB:  01-Nov-1970, LOS: 2 ADMISSION DATE:  10/09/2019, CONSULTATION DATE:  8/19  REFERRING MD:  Leafy Half, CHIEF COMPLAINT:  COVID 63   Brief History   49 year old male admitted 8/16 for COVID 19 pneumonia. 8/19 early AM decompensated requiring up to 70 L HHFNC and NRB with O2 sats in the 70s and complaints of "wearing out" PCCM consulted.  Also treated for staph bacteremia and UTI. Partially vaccinated, first dose of vaccine was on 8/10. Developed cold symptoms on 8/11.  Past Medical History  Hypertension Gout DM2 CHF Asthma Irritable bowel syndrome fibromyalgia   Significant Hospital Events   8/16 present to [facility], COVID pos on 4L 8/19 transfer to [facility] on 70L HF and NRB, transferred to ICU due to respiratory fatigue, intubated, prone 8/20 prone, diuresed  Consults:    Procedures:  8/19 ETT >  8/19 R arm PICC >   Significant Diagnostic Tests:  8/19 TTE > LVEF 30-35% 8/20 left foot X -ray healing fractures of left foot (4-5th metatarsal)  Micro Data:  8/16 SARS COV 2 > positive 8/16 Urine > MSSA 8/16 Blood > staph aureus 2/2, group B strep 2/2 8/19 blood >   Antimicrobials/COVID Rx:  8/16 remdesivir >  8/19 Baricitinib >  8/19 solumedrol >   ========  8/17 ceftriaxone >  8/17 vanc >   Interim history/subjective:    Back on insulin drip overnight Net negative   Objective   Blood pressure 136/81, pulse 82, temperature 98.5 F (36.9 C), temperature source Oral, resp. rate (!) 22, height 6\' 2"  (1.88 m), weight 121.9 kg, SpO2 98 %. CVP:  [8 mmHg-12 mmHg] 9 mmHg  Vent Mode: PRVC FiO2 (%):  [60 %-70 %] 60 % Set Rate:  [22 bmp] 22 bmp Vt Set:  [580 mL] 580 mL PEEP:  [16 cmH20] 16 cmH20 Plateau Pressure:  [20 cmH20-30 cmH20] 29 cmH20   Intake/Output Summary (Last 24 hours) at 10/14/2019 0740 Last data filed at 10/14/2019 0600 Gross per 24 hour  Intake 2120.99 ml  Output 5750 ml  Net -3629.01 ml   Filed Weights    10/10/19 2211 10/13/19 0341 10/14/19 0401  Weight: 111.1 kg 127.2 kg 121.9 kg    Examination:  General:  In bed on vent HENT: NCAT ETT in place PULM: Crackles bases B, vent supported breathing CV: RRR, no mgr GI: BS+, soft, nontender MSK: normal bulk and tone Neuro: sedated on vent  8/21 CXR > bilateral airspace disease, ETT in place  Resolved Hospital Problem list     Assessment & Plan:  ARDS due to COVID 19 Continue mechanical ventilation per ARDS protocol Target TVol 6-8cc/kgIBW Target Plateau Pressure < 30cm H20 Target driving pressure less than 15 cm of water Target PaO2 55-65: titrate PEEP/FiO2 per protocol As long as PaO2 to FiO2 ratio is less than 1:150 position in prone position for 16 hours a day Check CVP daily if CVL in place Target CVP less than 4, diurese as necessary Ventilator associated pneumonia prevention protocol 8/21 continue solumedrol, remdesivir, baricitinib, lasix, check supine abg to determine if needs prone  Severe ventilator dyssynchrony causing profound hypoxemia Continue neuromuscular blockade RASS target -5 Fentanyl, propofol per NMB protocol  Acute on chronic systolic CHF Lasix again today Tele  Staph aureus bacteremia and UTI Left foot wound: stepped on a nail 2-3 weeks prior to admission, diabetic foot; has broken 4-5 metatarsal Continue cefazolin As has source control will hold off  on MRI foot to evaluate for osteo, but will need this eventually  Asthma without acute exacerbation Albuterol  Hypokalemia Hyponatremia> 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 Replace K Monitor BMET and UOP Replace electrolytes as needed  DM2 with difficult to control hyperglycemia Stay on insulin drip  Best practice:  Diet: start tube feeding Pain/Anxiety/Delirium protocol (if indicated): as VAP protocol (if indicated): yes DVT prophylaxis: lovenox GI prophylaxis: start famotidine Glucose control: as above Mobility: bed rest Code Status:  full Family Communication: I called his sister Rosey Bath for an update on 8/21 Disposition: remain in ICU  Labs   CBC: Recent Labs  Lab 10/09/19 1900 10/12/19 0353 10/12/19 0423 10/12/19 0423 10/12/19 0511 10/12/19 0752 10/13/19 0340 10/13/19 1242 10/14/19 0350  WBC 9.1  --  15.5*  --   --   --  13.7*  --  11.1*  NEUTROABS 8.1*  --  14.2*  --   --   --  11.5*  --  9.1*  HGB 15.1   < > 15.1   < > 12.9* 14.3 12.1* 11.2* 11.5*  HCT 43.6   < > 43.2   < > 38.0* 42.0 35.0* 33.0* 33.5*  MCV 88.1  --  89.3  --   --   --  90.0  --  90.3  PLT 139*  --  151  --   --   --  142*  --  185   < > = values in this interval not displayed.    Basic Metabolic Panel: Recent Labs  Lab 10/10/19 1411 10/12/19 0353 10/12/19 0423 10/12/19 0511 10/12/19 0752 10/12/19 1530 10/13/19 0340 10/13/19 1242 10/13/19 1419 10/14/19 0350  NA 130*   < > 130*   < > 129*  --  128* 133* 137 137  K 3.2*   < > 3.1*   < > 3.4*  --  2.8* 4.0 3.4* 3.1*  CL 92*  --  89*  --   --   --  94*  --  103 96*  CO2 25  --  24  --   --   --  23  --  22 27  GLUCOSE 177*  --  253*  --   --   --  168*  --  309* 517*  BUN 17  --  10  --   --   --  28*  --  32* 40*  CREATININE 0.91  --  0.91  --   --   --  1.11  --  1.00 1.10  CALCIUM 7.7*  --  8.4*  --   --   --  7.5*  --  6.7* 7.8*  MG 1.0*  --  1.6*  --   --  2.5* 2.6*  --  2.0  --   PHOS  --   --  2.1*  --   --  5.3* 4.3  --  3.1  --    < > = values in this interval not displayed.   GFR: Estimated Creatinine Clearance: 110.2 mL/min (by C-G formula based on SCr of 1.1 mg/dL). Recent Labs  Lab 10/09/19 1900 10/09/19 2200 10/10/19 0115 10/10/19 2227 10/11/19 0525 10/12/19 0423 10/12/19 0525 10/12/19 1616 10/13/19 0340 10/14/19 0350  PROCALCITON  --   --  1.52  --   --   --   --   --   --   --   WBC 9.1  --   --   --   --  15.5*  --   --  13.7* 11.1*  LATICACIDVEN 2.4*   < >  --  2.0* 2.0*  --  2.1* 1.7  --   --    < > = values in this interval not displayed.     Liver Function Tests: Recent Labs  Lab 10/09/19 1900 10/12/19 0423 10/13/19 0340 10/14/19 0350  AST 47* 67* 48* 36  ALT 33 36 23 21  ALKPHOS 92 115 87 92  BILITOT 0.9 1.3* 0.8 0.5  PROT 7.6 6.7 5.5* 5.5*  ALBUMIN 3.6 2.3* 1.9* 1.8*   No results for input(s): LIPASE, AMYLASE in the last 168 hours. No results for input(s): AMMONIA in the last 168 hours.  ABG    Component Value Date/Time   PHART 7.312 (L) 10/13/2019 1242   PCO2ART 54.4 (H) 10/13/2019 1242   PO2ART 68 (L) 10/13/2019 1242   HCO3 27.7 10/13/2019 1242   TCO2 29 10/13/2019 1242   ACIDBASEDEF 2.0 10/12/2019 0752   O2SAT 92.0 10/13/2019 1242     Coagulation Profile: Recent Labs  Lab 10/12/19 0423  INR 1.1    Cardiac Enzymes: No results for input(s): CKTOTAL, CKMB, CKMBINDEX, TROPONINI in the last 168 hours.  HbA1C: Hgb A1c MFr Bld  Date/Time Value Ref Range Status  10/10/2019 01:15 AM 11.6 (H) 4.8 - 5.6 % Final    Comment:    (NOTE) Pre diabetes:          5.7%-6.4%  Diabetes:              >6.4%  Glycemic control for   <7.0% adults with diabetes     CBG: Recent Labs  Lab 10/14/19 0447 10/14/19 0518 10/14/19 0559 10/14/19 0626 10/14/19 0653  GLUCAP 480* 465* 454* 417* 401*    Critical care time: 40 minutes     Heber [place], MD [facility] Pager: [phone] Cell: [phone] If no response, call [phone]

## 2019-10-14 NOTE — Progress Notes (Signed)
Patient's head repositioned and arms rotated.  Patient tolerated well.  No skin breakdown noted at this time.    

## 2019-10-14 NOTE — Progress Notes (Signed)
RT NOTE: ° °Pt's head turned to the right and arms rotated. ETT secured throughout. °

## 2019-10-14 NOTE — Progress Notes (Signed)
eLink Physician-Brief Progress Note Patient Name: Micheal Bell DOB: [birth year] MRN: [id number]   Date of Service  10/14/2019  HPI/Events of Note  Hyperglycemia - Blood glucose = 475. Patient already on Q 4 hour resistant Novolog SSI + high dose intermediate acting insulin + extra Q 4 hour Novolog.   eICU Interventions  Plan: 1. D/C currently insulin regimen.  2. Insulin IV infusion per [facility]     Intervention Category Major Interventions: Other:  Lenell Antu 10/14/2019, 3:57 AM

## 2019-10-14 NOTE — Progress Notes (Signed)
Patient placed in supine position.  ETT secured.  No breakdown noted.  Patient tolerated well with no complications. 

## 2019-10-14 NOTE — Progress Notes (Signed)
RT NOTE:  Pt's head turned to the left and arms rotated. ETT secured throughout. 

## 2019-10-15 ENCOUNTER — Inpatient Hospital Stay (HOSPITAL_COMMUNITY): Payer: Medicaid Other

## 2019-10-15 DIAGNOSIS — U071 COVID-19: Secondary | ICD-10-CM

## 2019-10-15 LAB — CBC WITH DIFFERENTIAL/PLATELET
Abs Immature Granulocytes: 0.89 10*3/uL — ABNORMAL HIGH (ref 0.00–0.07)
Basophils Absolute: 0 10*3/uL (ref 0.0–0.1)
Basophils Relative: 0 %
Eosinophils Absolute: 0 10*3/uL (ref 0.0–0.5)
Eosinophils Relative: 0 %
HCT: 32.6 % — ABNORMAL LOW (ref 39.0–52.0)
Hemoglobin: 10.9 g/dL — ABNORMAL LOW (ref 13.0–17.0)
Immature Granulocytes: 8 %
Lymphocytes Relative: 7 %
Lymphs Abs: 0.8 10*3/uL (ref 0.7–4.0)
MCH: 30.8 pg (ref 26.0–34.0)
MCHC: 33.4 g/dL (ref 30.0–36.0)
MCV: 92.1 fL (ref 80.0–100.0)
Monocytes Absolute: 0.7 10*3/uL (ref 0.1–1.0)
Monocytes Relative: 6 %
Neutro Abs: 9.2 10*3/uL — ABNORMAL HIGH (ref 1.7–7.7)
Neutrophils Relative %: 79 %
Platelets: 202 10*3/uL (ref 150–400)
RBC: 3.54 MIL/uL — ABNORMAL LOW (ref 4.22–5.81)
RDW: 13 % (ref 11.5–15.5)
WBC: 11.6 10*3/uL — ABNORMAL HIGH (ref 4.0–10.5)
nRBC: 0 % (ref 0.0–0.2)

## 2019-10-15 LAB — GLUCOSE, CAPILLARY
Glucose-Capillary: 156 mg/dL — ABNORMAL HIGH (ref 70–99)
Glucose-Capillary: 143 mg/dL — ABNORMAL HIGH (ref 70–99)
Glucose-Capillary: 192 mg/dL — ABNORMAL HIGH (ref 70–99)
Glucose-Capillary: 206 mg/dL — ABNORMAL HIGH (ref 70–99)
Glucose-Capillary: 181 mg/dL — ABNORMAL HIGH (ref 70–99)
Glucose-Capillary: 157 mg/dL — ABNORMAL HIGH (ref 70–99)
Glucose-Capillary: 195 mg/dL — ABNORMAL HIGH (ref 70–99)
Glucose-Capillary: 186 mg/dL — ABNORMAL HIGH (ref 70–99)
Glucose-Capillary: 190 mg/dL — ABNORMAL HIGH (ref 70–99)
Glucose-Capillary: 155 mg/dL — ABNORMAL HIGH (ref 70–99)
Glucose-Capillary: 129 mg/dL — ABNORMAL HIGH (ref 70–99)
Glucose-Capillary: 144 mg/dL — ABNORMAL HIGH (ref 70–99)
Glucose-Capillary: 113 mg/dL — ABNORMAL HIGH (ref 70–99)
Glucose-Capillary: 196 mg/dL — ABNORMAL HIGH (ref 70–99)
Glucose-Capillary: 201 mg/dL — ABNORMAL HIGH (ref 70–99)
Glucose-Capillary: 192 mg/dL — ABNORMAL HIGH (ref 70–99)
Glucose-Capillary: 170 mg/dL — ABNORMAL HIGH (ref 70–99)
Glucose-Capillary: 168 mg/dL — ABNORMAL HIGH (ref 70–99)
Glucose-Capillary: 169 mg/dL — ABNORMAL HIGH (ref 70–99)

## 2019-10-15 LAB — COMPREHENSIVE METABOLIC PANEL
ALT: 21 U/L (ref 0–44)
AST: 41 U/L (ref 15–41)
Albumin: 1.8 g/dL — ABNORMAL LOW (ref 3.5–5.0)
Alkaline Phosphatase: 108 U/L (ref 38–126)
Anion gap: 11 (ref 5–15)
BUN: 51 mg/dL — ABNORMAL HIGH (ref 6–20)
CO2: 33 mmol/L — ABNORMAL HIGH (ref 22–32)
Calcium: 8 mg/dL — ABNORMAL LOW (ref 8.9–10.3)
Chloride: 102 mmol/L (ref 98–111)
Creatinine, Ser: 0.96 mg/dL (ref 0.61–1.24)
GFR calc Af Amer: >60 mL/min (ref >60)
GFR calc non Af Amer: >60 mL/min (ref >60)
Glucose, Bld: 180 mg/dL — ABNORMAL HIGH (ref 70–99)
Potassium: 4 mmol/L (ref 3.5–5.1)
Sodium: 146 mmol/L — ABNORMAL HIGH (ref 135–145)
Total Bilirubin: 0.4 mg/dL (ref 0.3–1.2)
Total Protein: 5.6 g/dL — ABNORMAL LOW (ref 6.5–8.1)

## 2019-10-15 LAB — TRIGLYCERIDES: Triglycerides: 137 mg/dL (ref <150)

## 2019-10-15 IMAGING — DX DG CHEST 1V PORT
1 series · 1 of 1 positions shown · non-contrast
Comparison: Chest x-ray [DATE].

CLINICAL DATA: 49-year-old male with history of COVID infection.

EXAM:
PORTABLE CHEST 1 VIEW

[chest ap]
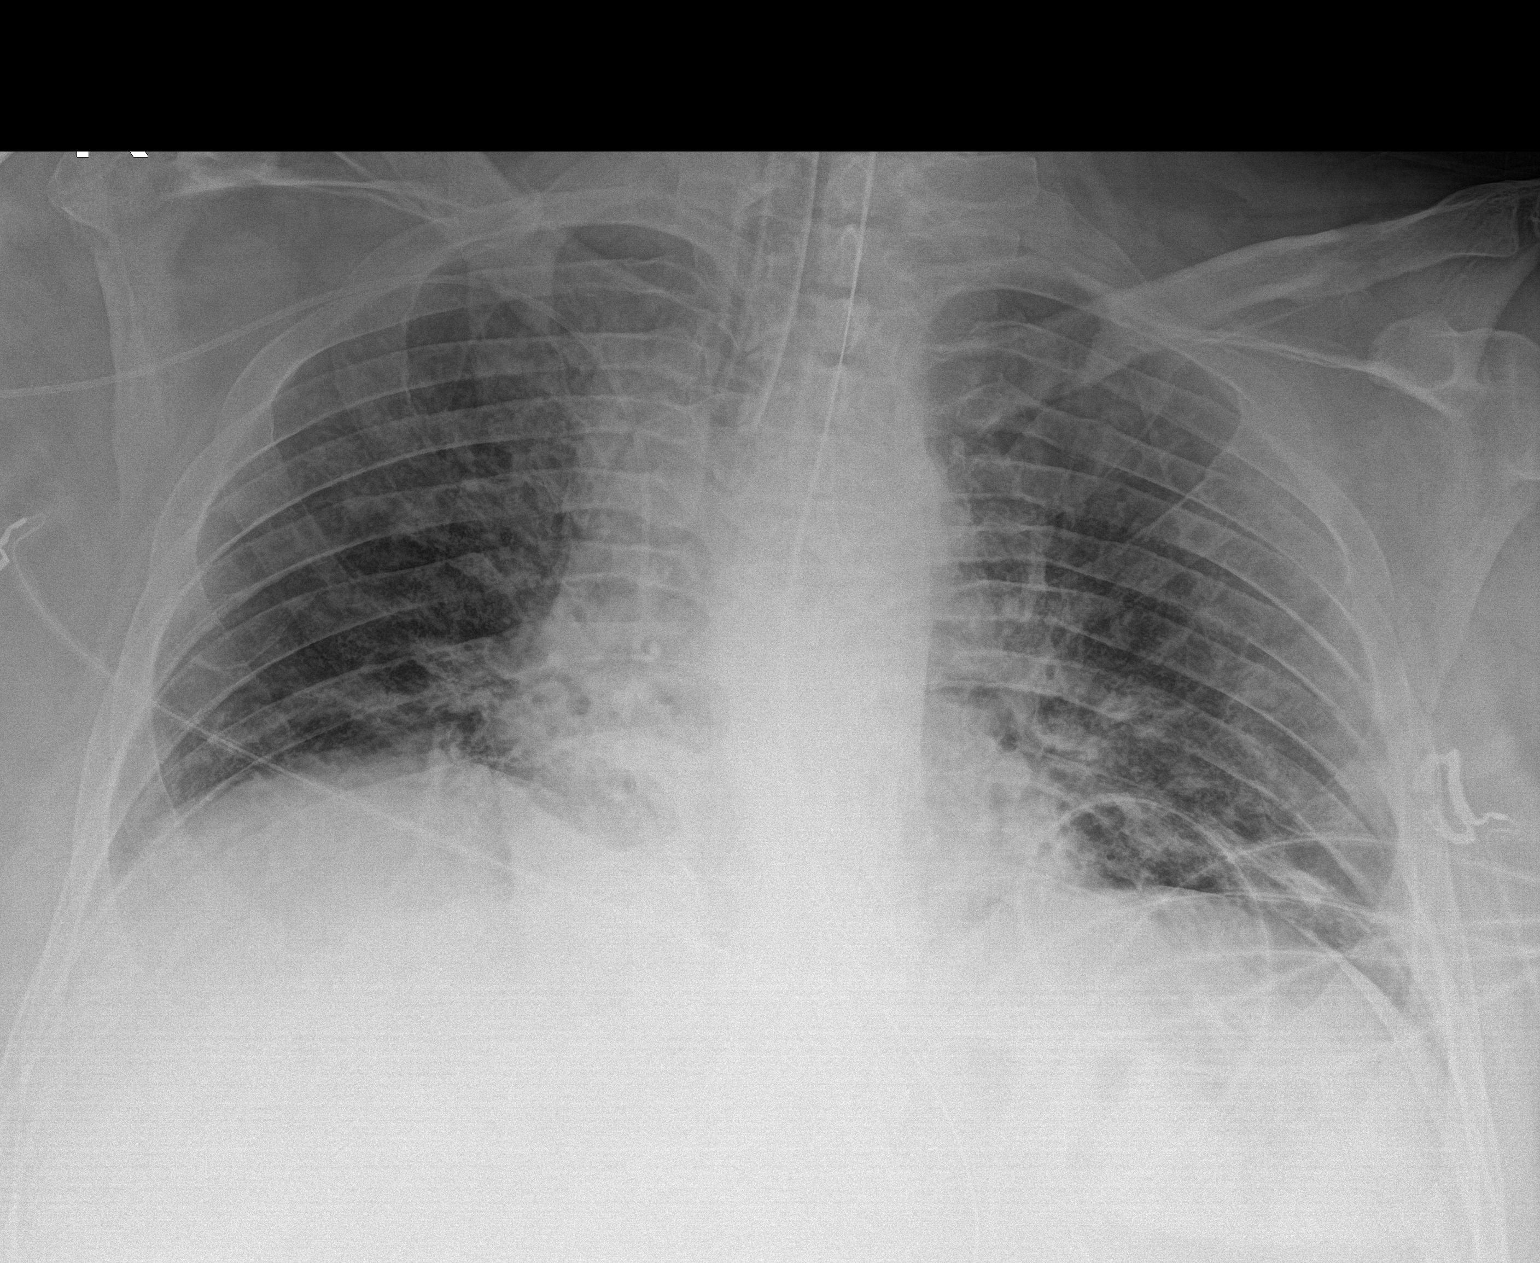

[1 of 1 positions shown; findings below may reference images not displayed]

FINDINGS: An endotracheal tube is in place with tip 3.6 cm above the carina. A
nasogastric tube is seen extending into the stomach, however, the
tip of the nasogastric tube extends below the lower margin of the
image. There is a right upper extremity PICC with tip terminating in
the right atrium. Lung volumes are low. Widespread patchy areas of
interstitial prominence an ill-defined airspace disease again noted
throughout the lungs bilaterally. Overall, aeration is essentially
unchanged. No pleural effusions. No evidence of pulmonary edema. No
pneumothorax. Heart size is normal. Upper mediastinal contours are
within normal limits.
IMPRESSION: 1. Support apparatus, as above.
2. The appearance the chest is compatible with multilobar pneumonia
from reported COVID infection.

## 2019-10-15 MED ORDER — MIDAZOLAM HCL 2 MG/2ML IJ SOLN
2.0000 mg | INTRAMUSCULAR | Status: AC | PRN
  Administered 2019-10-16 – 2019-10-20 (×3): 2 mg via INTRAVENOUS
  Filled 2019-10-15 (×4): qty 2

## 2019-10-15 MED ORDER — FENTANYL BOLUS VIA INFUSION
50.0000 ug | INTRAVENOUS | Status: DC | PRN
  Filled 2019-10-15: qty 50

## 2019-10-15 MED ORDER — CLONAZEPAM 0.5 MG PO TBDP
2.0000 mg | ORAL_TABLET | Freq: Two times a day (BID) | ORAL | Status: DC
  Administered 2019-10-15 – 2019-10-21 (×13): 2 mg
  Filled 2019-10-15 (×13): qty 4

## 2019-10-15 MED ORDER — PREDNISONE 20 MG PO TABS
50.0000 mg | ORAL_TABLET | Freq: Every day | ORAL | Status: DC
  Filled 2019-10-15: qty 1

## 2019-10-15 MED ORDER — FREE WATER
200.0000 mL | Status: DC
  Administered 2019-10-15 – 2019-10-16 (×7): 200 mL

## 2019-10-15 MED ORDER — DEXMEDETOMIDINE HCL IN NACL 400 MCG/100ML IV SOLN
0.0000 ug/kg/h | INTRAVENOUS | Status: AC
  Administered 2019-10-15: 0.8 ug/kg/h via INTRAVENOUS
  Administered 2019-10-15: 0.9 ug/kg/h via INTRAVENOUS
  Administered 2019-10-15: 0.8 ug/kg/h via INTRAVENOUS
  Administered 2019-10-15: 1 ug/kg/h via INTRAVENOUS
  Administered 2019-10-16: 1.2 ug/kg/h via INTRAVENOUS
  Administered 2019-10-16: 0.653 ug/kg/h via INTRAVENOUS
  Administered 2019-10-16: 1.2 ug/kg/h via INTRAVENOUS
  Administered 2019-10-16: 1 ug/kg/h via INTRAVENOUS
  Administered 2019-10-16: 0.8 ug/kg/h via INTRAVENOUS
  Administered 2019-10-16 (×2): 1.2 ug/kg/h via INTRAVENOUS
  Administered 2019-10-16 – 2019-10-17 (×4): 1 ug/kg/h via INTRAVENOUS
  Administered 2019-10-17: 0.4 ug/kg/h via INTRAVENOUS
  Administered 2019-10-17 (×2): 0.8 ug/kg/h via INTRAVENOUS
  Administered 2019-10-18 (×3): 1 ug/kg/h via INTRAVENOUS
  Filled 2019-10-15 (×2): qty 100
  Filled 2019-10-15: qty 200
  Filled 2019-10-15 (×7): qty 100
  Filled 2019-10-15: qty 200
  Filled 2019-10-15: qty 100
  Filled 2019-10-15: qty 200
  Filled 2019-10-15 (×2): qty 100
  Filled 2019-10-15: qty 200
  Filled 2019-10-15: qty 100

## 2019-10-15 MED ORDER — DOCUSATE SODIUM 50 MG/5ML PO LIQD
100.0000 mg | Freq: Two times a day (BID) | ORAL | Status: DC
  Administered 2019-10-15 – 2019-10-24 (×15): 100 mg
  Filled 2019-10-15 (×16): qty 10

## 2019-10-15 MED ORDER — FENTANYL CITRATE (PF) 100 MCG/2ML IJ SOLN: 50.0000 ug | Freq: Once | INTRAMUSCULAR | Status: DC

## 2019-10-15 MED ORDER — BARICITINIB 2 MG PO TABS
4.0000 mg | ORAL_TABLET | Freq: Every day | ORAL | Status: DC
  Administered 2019-10-15 – 2019-10-23 (×9): 4 mg
  Filled 2019-10-15 (×8): qty 2

## 2019-10-15 MED ORDER — FUROSEMIDE 10 MG/ML IJ SOLN
40.0000 mg | Freq: Four times a day (QID) | INTRAMUSCULAR | Status: AC
  Administered 2019-10-15 (×2): 40 mg via INTRAVENOUS
  Filled 2019-10-15 (×2): qty 4

## 2019-10-15 MED ORDER — POLYETHYLENE GLYCOL 3350 17 G PO PACK: 17.0000 g | PACK | Freq: Every day | ORAL | Status: DC

## 2019-10-15 MED ORDER — CLONAZEPAM 0.1 MG/ML ORAL SUSPENSION: 2.0000 mg | Freq: Two times a day (BID) | ORAL | Status: DC

## 2019-10-15 MED ORDER — DOCUSATE SODIUM 50 MG/5ML PO LIQD: 100.0000 mg | Freq: Two times a day (BID) | ORAL | Status: DC

## 2019-10-15 MED ORDER — POLYETHYLENE GLYCOL 3350 17 G PO PACK
17.0000 g | PACK | Freq: Every day | ORAL | Status: DC
  Administered 2019-10-15 – 2019-10-25 (×7): 17 g
  Filled 2019-10-15 (×7): qty 1

## 2019-10-15 MED ORDER — MIDAZOLAM HCL 2 MG/2ML IJ SOLN
2.0000 mg | INTRAMUSCULAR | Status: DC | PRN
  Administered 2019-10-15 – 2019-10-23 (×13): 2 mg via INTRAVENOUS
  Filled 2019-10-15 (×13): qty 2

## 2019-10-15 MED ORDER — FENTANYL 2500MCG IN NS 250ML (10MCG/ML) PREMIX INFUSION
0.0000 ug/h | INTRAVENOUS | Status: DC
  Administered 2019-10-15 (×2): 300 ug/h via INTRAVENOUS
  Administered 2019-10-16 (×3): 400 ug/h via INTRAVENOUS
  Administered 2019-10-16: 300 ug/h via INTRAVENOUS
  Administered 2019-10-17: 400 ug/h via INTRAVENOUS
  Administered 2019-10-17 (×2): 200 ug/h via INTRAVENOUS
  Administered 2019-10-18: 50 ug/h via INTRAVENOUS
  Administered 2019-10-18: 200 ug/h via INTRAVENOUS
  Administered 2019-10-19: 225 ug/h via INTRAVENOUS
  Administered 2019-10-19: 200 ug/h via INTRAVENOUS
  Administered 2019-10-20 (×2): 400 ug/h via INTRAVENOUS
  Administered 2019-10-20: 200 ug/h via INTRAVENOUS
  Administered 2019-10-21 (×4): 400 ug/h via INTRAVENOUS
  Filled 2019-10-15 (×4): qty 250
  Filled 2019-10-15: qty 500
  Filled 2019-10-15 (×12): qty 250

## 2019-10-15 MED ORDER — METOPROLOL TARTRATE 5 MG/5ML IV SOLN
2.5000 mg | Freq: Four times a day (QID) | INTRAVENOUS | Status: DC
  Administered 2019-10-15 – 2019-10-22 (×23): 2.5 mg via INTRAVENOUS
  Filled 2019-10-15 (×24): qty 5

## 2019-10-15 NOTE — Progress Notes (Signed)
NAME:  Micheal Bell, MRN:  [id number], DOB:  07-16-1970, LOS: 3 ADMISSION DATE:  10/09/2019, CONSULTATION DATE:  8/19  REFERRING MD:  Leafy Half, CHIEF COMPLAINT:  COVID 65   Brief History   49 year old male admitted 8/16 for COVID 19 pneumonia. 8/19 early AM decompensated requiring up to 70 L HHFNC and NRB with O2 sats in the 70s and complaints of "wearing out" PCCM consulted.  Also treated for staph bacteremia and UTI. Partially vaccinated, first dose of vaccine was on 8/10. Developed cold symptoms on 8/11.  Past Medical History  Hypertension Gout DM2 CHF Asthma Irritable bowel syndrome fibromyalgia   Significant Hospital Events   8/16 present to [facility], COVID pos on 4L 8/19 transfer to [facility] on 70L HF and NRB, transferred to ICU due to respiratory fatigue, intubated, prone 8/20 prone, diuresed  Consults:    Procedures:  8/19 ETT >  8/19 R arm PICC >   Significant Diagnostic Tests:  8/19 TTE > LVEF 30-35% 8/20 left foot X -ray healing fractures of left foot (4-5th metatarsal)  Micro Data:  8/16 SARS COV 2 > positive 8/16 Urine > MSSA 8/16 Blood > staph aureus 2/2, group B strep 2/2 8/19 blood >   Antimicrobials/COVID Rx:  8/16 remdesivir >  8/19 Baricitinib >  8/19 solumedrol >   ========  8/17 ceftriaxone >  8/17 vanc >   Interim history/subjective:   Diuresing Vent settings improving  Objective   Blood pressure (!) 153/87, pulse 72, temperature 98.8 F (37.1 C), temperature source Oral, resp. rate (!) 22, height 6\' 2"  (1.88 m), weight 122.5 kg, SpO2 95 %. CVP:  [6 mmHg-9 mmHg] 7 mmHg  Vent Mode: PRVC FiO2 (%):  [50 %-70 %] 50 % Set Rate:  [22 bmp] 22 bmp Vt Set:  [580 mL] 580 mL PEEP:  [12 cmH20-16 cmH20] 12 cmH20 Plateau Pressure:  [22 cmH20-27 cmH20] 22 cmH20   Intake/Output Summary (Last 24 hours) at 10/15/2019 0724 Last data filed at 10/15/2019 0700 Gross per 24 hour  Intake 3153.02 ml  Output 4650 ml  Net -1496.98 ml   Filed Weights     10/13/19 0341 10/14/19 0401 10/15/19 0229  Weight: 127.2 kg 121.9 kg 122.5 kg    Examination:  General:  In bed on vent HENT: NCAT ETT in place PULM: Crackles bases B, vent supported breathing CV: RRR, no mgr GI: BS+, soft, nontender  MSK: normal bulk and tone Neuro: sedated on vent but will wake up periodically  8/22 CXR > bilateral interstitial infiltrate slightly improved, left worse than right, ett in place  Resolved Hospital Problem list     Assessment & Plan:  ARDS due to COVID 19> oxygenation improving Acute pulmonary edema> improved with diuresis Continue mechanical ventilation per ARDS protocol Target TVol 6-8cc/kgIBW Target Plateau Pressure < 30cm H20 Target driving pressure less than 15 cm of water Target PaO2 55-65: titrate PEEP/FiO2 per protocol As long as PaO2 to FiO2 ratio is less than 1:150 position in prone position for 16 hours a day Check CVP daily if CVL in place Target CVP less than 4, diurese as necessary Ventilator associated pneumonia prevention protocol 8/22 > change solumedrol to prednisone, continue baricitinib, lasix,    Severe ventilator dyssynchrony causing profound hypoxemia D/c versed drip Fentanyl drip > consider change to dilaudid Start precedex Change RASS score to -2 Add clonazepam D/c NMB protocol  Acute on chronic systolic CHF Tele Lasix again today  Staph aureus bacteremia and UTI Left  foot wound: stepped on a nail 2-3 weeks prior to admission, diabetic foot; has broken 4-5 metatarsal; currently has source control Cefazolin Will need MRI foot eventually  Moderate persistent Asthma without acute exacerbation Scheduled douneb  Hyponatremia> resolved, now mild hypernatremia Monitor BMET and UOP Replace electrolytes as needed Add free water via tube  DM2 with difficult to control hyperglycemia Continue insulin drip as poorly controlled on sub cutaneous Stopping solumedrol, changing to prednisone Likely convert to sub q  this wee  Best practice:  Diet: start tube feeding Pain/Anxiety/Delirium protocol (if indicated): as VAP protocol (if indicated): yes DVT prophylaxis: lovenox GI prophylaxis: start famotidine Glucose control: as above Mobility: bed rest Code Status: full Family Communication: I called his sister Rosey Bath for an update on 8/21, will call again 8/22 Disposition: remain in ICU  Labs   CBC: Recent Labs  Lab 10/09/19 1900 10/12/19 0353 10/12/19 0423 10/12/19 0511 10/13/19 0340 10/13/19 1242 10/14/19 0350 10/14/19 1530 10/15/19 0430  WBC 9.1  --  15.5*  --  13.7*  --  11.1*  --  11.6*  NEUTROABS 8.1*  --  14.2*  --  11.5*  --  9.1*  --  9.2*  HGB 15.1   < > 15.1   < > 12.1* 11.2* 11.5* 10.9* 10.9*  HCT 43.6   < > 43.2   < > 35.0* 33.0* 33.5* 32.0* 32.6*  MCV 88.1  --  89.3  --  90.0  --  90.3  --  92.1  PLT 139*  --  151  --  142*  --  185  --  202   < > = values in this interval not displayed.    Basic Metabolic Panel: Recent Labs  Lab 10/10/19 1411 10/12/19 0353 10/12/19 0423 10/12/19 0511 10/12/19 0752 10/12/19 1530 10/13/19 0340 10/13/19 0340 10/13/19 1242 10/13/19 1419 10/14/19 0350 10/14/19 1530 10/15/19 0430  NA 130*   < > 130*   < >   < >  --  128*   < > 133* 137 137 143 146*  K 3.2*   < > 3.1*   < >   < >  --  2.8*   < > 4.0 3.4* 3.1* 3.7 4.0  CL 92*  --  89*  --   --   --  94*  --   --  103 96*  --  102  CO2 25  --  24  --   --   --  23  --   --  22 27  --  33*  GLUCOSE 177*  --  253*  --   --   --  168*  --   --  309* 517*  --  180*  BUN 17  --  10  --   --   --  28*  --   --  32* 40*  --  51*  CREATININE 0.91  --  0.91  --   --   --  1.11  --   --  1.00 1.10  --  0.96  CALCIUM 7.7*  --  8.4*  --   --   --  7.5*  --   --  6.7* 7.8*  --  8.0*  MG 1.0*  --  1.6*  --   --  2.5* 2.6*  --   --  2.0  --   --   --   PHOS  --   --  2.1*  --   --  5.3* 4.3  --   --  3.1  --   --   --    < > = values in this interval not displayed.   GFR: Estimated Creatinine  Clearance: 126.6 mL/min (by C-G formula based on SCr of 0.96 mg/dL). Recent Labs  Lab 10/09/19 1900 10/09/19 2200 10/10/19 0115 10/10/19 2227 10/11/19 0525 10/12/19 0423 10/12/19 0525 10/12/19 1616 10/13/19 0340 10/14/19 0350 10/15/19 0430  PROCALCITON  --   --  1.52  --   --   --   --   --   --   --   --   WBC   < >  --   --   --   --  15.5*  --   --  13.7* 11.1* 11.6*  LATICACIDVEN  --    < >  --  2.0* 2.0*  --  2.1* 1.7  --   --   --    < > = values in this interval not displayed.    Liver Function Tests: Recent Labs  Lab 10/09/19 1900 10/12/19 0423 10/13/19 0340 10/14/19 0350 10/15/19 0430  AST 47* 67* 48* 36 41  ALT 33 36 23 21 21   ALKPHOS 92 115 87 92 108  BILITOT 0.9 1.3* 0.8 0.5 0.4  PROT 7.6 6.7 5.5* 5.5* 5.6*  ALBUMIN 3.6 2.3* 1.9* 1.8* 1.8*   No results for input(s): LIPASE, AMYLASE in the last 168 hours. No results for input(s): AMMONIA in the last 168 hours.  ABG    Component Value Date/Time   PHART 7.400 10/14/2019 1530   PCO2ART 56.7 (H) 10/14/2019 1530   PO2ART 111 (H) 10/14/2019 1530   HCO3 35.1 (H) 10/14/2019 1530   TCO2 37 (H) 10/14/2019 1530   ACIDBASEDEF 2.0 10/12/2019 0752   O2SAT 98.0 10/14/2019 1530     Coagulation Profile: Recent Labs  Lab 10/12/19 0423  INR 1.1    Cardiac Enzymes: No results for input(s): CKTOTAL, CKMB, CKMBINDEX, TROPONINI in the last 168 hours.  HbA1C: Hgb A1c MFr Bld  Date/Time Value Ref Range Status  10/10/2019 01:15 AM 11.6 (H) 4.8 - 5.6 % Final    Comment:    (NOTE) Pre diabetes:          5.7%-6.4%  Diabetes:              >6.4%  Glycemic control for   <7.0% adults with diabetes     CBG: Recent Labs  Lab 10/15/19 0131 10/15/19 0325 10/15/19 0526 10/15/19 0619 10/15/19 0701  GLUCAP 156* 143* 192* 206* 181*    Critical care time: 32 minutes     10/17/19, MD [place] [facility] Pager: [phone] Cell: [phone] If no response, call [phone]

## 2019-10-15 NOTE — Progress Notes (Signed)
[facility]  I called Rosey Bath to give her an update  Has been hypertensive today, added low dose metoprolol IV  Ventilatory settings have weaned well  Diuresing   Heber [place], MD [place] [facility] Pager: [phone] Cell: [phone] If no response, call [phone]

## 2019-10-15 NOTE — Progress Notes (Signed)
Assisted tele visit to patient with family member.  Rawan Riendeau Ann, RN  

## 2019-10-15 NOTE — Plan of Care (Signed)
  Problem: Education: Goal: Knowledge of risk factors and measures for prevention of condition will improve Outcome: Progressing   Problem: Coping: Goal: Psychosocial and spiritual needs will be supported Outcome: Progressing   Problem: Respiratory: Goal: Will maintain a patent airway Outcome: Progressing   Problem: Education: Goal: Knowledge of General Education information will improve Description: Including pain rating scale, medication(s)/side effects and non-pharmacologic comfort measures Outcome: Progressing   Problem: Clinical Measurements: Goal: Respiratory complications will improve Outcome: Progressing   Problem: Activity: Goal: Risk for activity intolerance will decrease Outcome: Progressing   Problem: Coping: Goal: Level of anxiety will decrease Outcome: Progressing   Problem: Pain Managment: Goal: General experience of comfort will improve Outcome: Progressing   Problem: Safety: Goal: Ability to remain free from injury will improve Outcome: Progressing   Problem: Skin Integrity: Goal: Risk for impaired skin integrity will decrease Outcome: Progressing

## 2019-10-16 ENCOUNTER — Inpatient Hospital Stay (HOSPITAL_COMMUNITY): Payer: Medicaid Other

## 2019-10-16 LAB — CBC WITH DIFFERENTIAL/PLATELET
Abs Immature Granulocytes: 1.09 10*3/uL — ABNORMAL HIGH (ref 0.00–0.07)
Basophils Absolute: 0.1 10*3/uL (ref 0.0–0.1)
Basophils Relative: 1 %
Eosinophils Absolute: 0 10*3/uL (ref 0.0–0.5)
Eosinophils Relative: 0 %
HCT: 38.8 % — ABNORMAL LOW (ref 39.0–52.0)
Hemoglobin: 12.3 g/dL — ABNORMAL LOW (ref 13.0–17.0)
Immature Granulocytes: 8 %
Lymphocytes Relative: 11 %
Lymphs Abs: 1.5 10*3/uL (ref 0.7–4.0)
MCH: 30.2 pg (ref 26.0–34.0)
MCHC: 31.7 g/dL (ref 30.0–36.0)
MCV: 95.3 fL (ref 80.0–100.0)
Monocytes Absolute: 0.5 10*3/uL (ref 0.1–1.0)
Monocytes Relative: 4 %
Neutro Abs: 10.9 10*3/uL — ABNORMAL HIGH (ref 1.7–7.7)
Neutrophils Relative %: 76 %
Platelets: 238 10*3/uL (ref 150–400)
RBC: 4.07 MIL/uL — ABNORMAL LOW (ref 4.22–5.81)
RDW: 13.2 % (ref 11.5–15.5)
WBC: 14.1 10*3/uL — ABNORMAL HIGH (ref 4.0–10.5)
nRBC: 0.2 % (ref 0.0–0.2)

## 2019-10-16 LAB — COMPREHENSIVE METABOLIC PANEL
ALT: 34 U/L (ref 0–44)
AST: 73 U/L — ABNORMAL HIGH (ref 15–41)
Albumin: 2 g/dL — ABNORMAL LOW (ref 3.5–5.0)
Alkaline Phosphatase: 118 U/L (ref 38–126)
Anion gap: 12 (ref 5–15)
BUN: 51 mg/dL — ABNORMAL HIGH (ref 6–20)
CO2: 35 mmol/L — ABNORMAL HIGH (ref 22–32)
Calcium: 8.5 mg/dL — ABNORMAL LOW (ref 8.9–10.3)
Chloride: 102 mmol/L (ref 98–111)
Creatinine, Ser: 0.99 mg/dL (ref 0.61–1.24)
GFR calc Af Amer: >60 mL/min (ref >60)
GFR calc non Af Amer: >60 mL/min (ref >60)
Glucose, Bld: 131 mg/dL — ABNORMAL HIGH (ref 70–99)
Potassium: 3.4 mmol/L — ABNORMAL LOW (ref 3.5–5.1)
Sodium: 149 mmol/L — ABNORMAL HIGH (ref 135–145)
Total Bilirubin: 0.8 mg/dL (ref 0.3–1.2)
Total Protein: 6 g/dL — ABNORMAL LOW (ref 6.5–8.1)

## 2019-10-16 LAB — GLUCOSE, CAPILLARY
Glucose-Capillary: 103 mg/dL — ABNORMAL HIGH (ref 70–99)
Glucose-Capillary: 104 mg/dL — ABNORMAL HIGH (ref 70–99)
Glucose-Capillary: 130 mg/dL — ABNORMAL HIGH (ref 70–99)
Glucose-Capillary: 132 mg/dL — ABNORMAL HIGH (ref 70–99)
Glucose-Capillary: 134 mg/dL — ABNORMAL HIGH (ref 70–99)
Glucose-Capillary: 136 mg/dL — ABNORMAL HIGH (ref 70–99)
Glucose-Capillary: 141 mg/dL — ABNORMAL HIGH (ref 70–99)
Glucose-Capillary: 144 mg/dL — ABNORMAL HIGH (ref 70–99)
Glucose-Capillary: 147 mg/dL — ABNORMAL HIGH (ref 70–99)
Glucose-Capillary: 151 mg/dL — ABNORMAL HIGH (ref 70–99)
Glucose-Capillary: 154 mg/dL — ABNORMAL HIGH (ref 70–99)
Glucose-Capillary: 165 mg/dL — ABNORMAL HIGH (ref 70–99)
Glucose-Capillary: 173 mg/dL — ABNORMAL HIGH (ref 70–99)
Glucose-Capillary: 176 mg/dL — ABNORMAL HIGH (ref 70–99)
Glucose-Capillary: 176 mg/dL — ABNORMAL HIGH (ref 70–99)
Glucose-Capillary: 179 mg/dL — ABNORMAL HIGH (ref 70–99)

## 2019-10-16 LAB — POCT I-STAT 7, (LYTES, BLD GAS, ICA,H+H)
Acid-Base Excess: 14 mmol/L — ABNORMAL HIGH (ref 0.0–2.0)
Bicarbonate: 38.9 mmol/L — ABNORMAL HIGH (ref 20.0–28.0)
Calcium, Ion: 1.15 mmol/L (ref 1.15–1.40)
HCT: 37 % — ABNORMAL LOW (ref 39.0–52.0)
Hemoglobin: 12.6 g/dL — ABNORMAL LOW (ref 13.0–17.0)
O2 Saturation: 88 %
Patient temperature: 99.3
Potassium: 3.6 mmol/L (ref 3.5–5.1)
Sodium: 148 mmol/L — ABNORMAL HIGH (ref 135–145)
TCO2: 40 mmol/L — ABNORMAL HIGH (ref 22–32)
pCO2 arterial: 49.2 mmHg — ABNORMAL HIGH (ref 32.0–48.0)
pH, Arterial: 7.508 — ABNORMAL HIGH (ref 7.350–7.450)
pO2, Arterial: 52 mmHg — ABNORMAL LOW (ref 83.0–108.0)

## 2019-10-16 IMAGING — DX DG CHEST 1V PORT
1 series · 1 of 1 positions shown · non-contrast
Comparison: [DATE]

CLINICAL DATA: Status post intubation.

EXAM:
PORTABLE CHEST 1 VIEW

[chest ap]
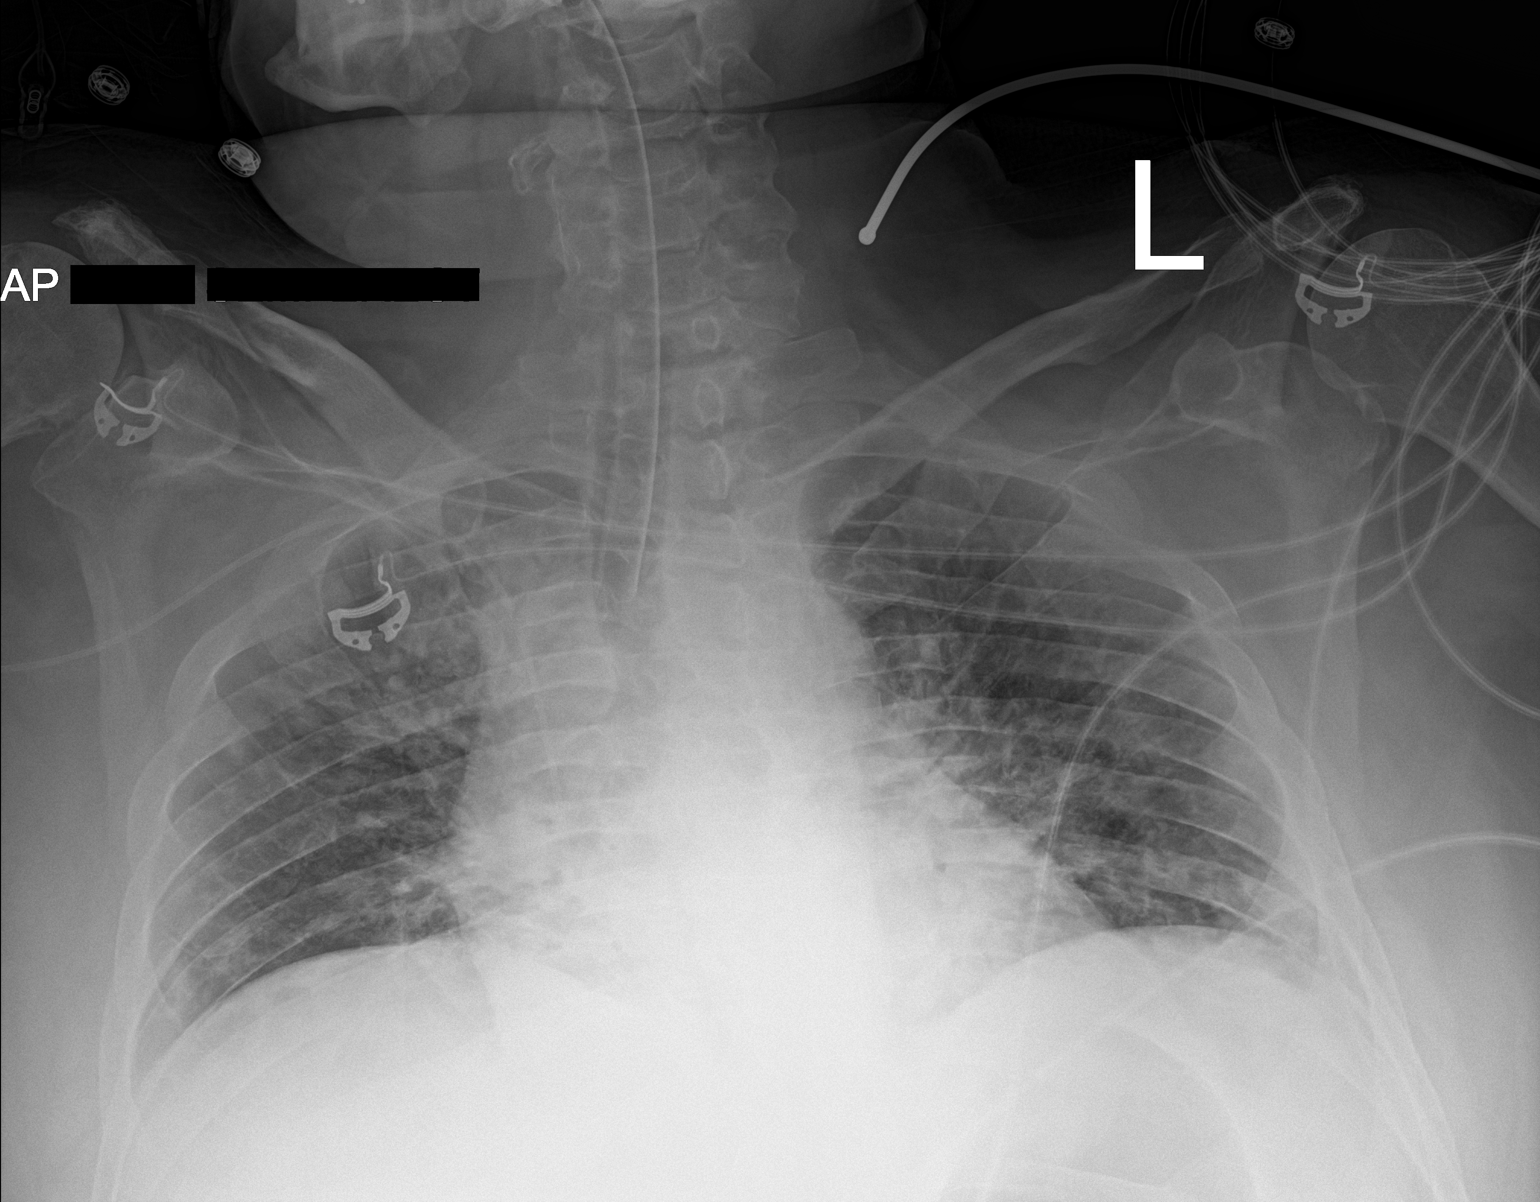

[1 of 1 positions shown; findings below may reference images not displayed]

FINDINGS: ET tube tip is above the carina. There is a right arm PICC line with
tip at the cavoatrial junction. Lung volumes are low. Similar
appearance of bilateral pulmonary opacities compatible with
multifocal pneumonia.
IMPRESSION: 1. Stable support apparatus.
2. Persistent bilateral pulmonary opacities compatible with
multifocal pneumonia.

## 2019-10-16 IMAGING — DX DG ABD PORTABLE 1V
1 series · 1 of 1 positions shown · non-contrast
Comparison: [DATE]

CLINICAL DATA: Status post OG tube placement

EXAM:
PORTABLE ABDOMEN - 1 VIEW

[abdomen]
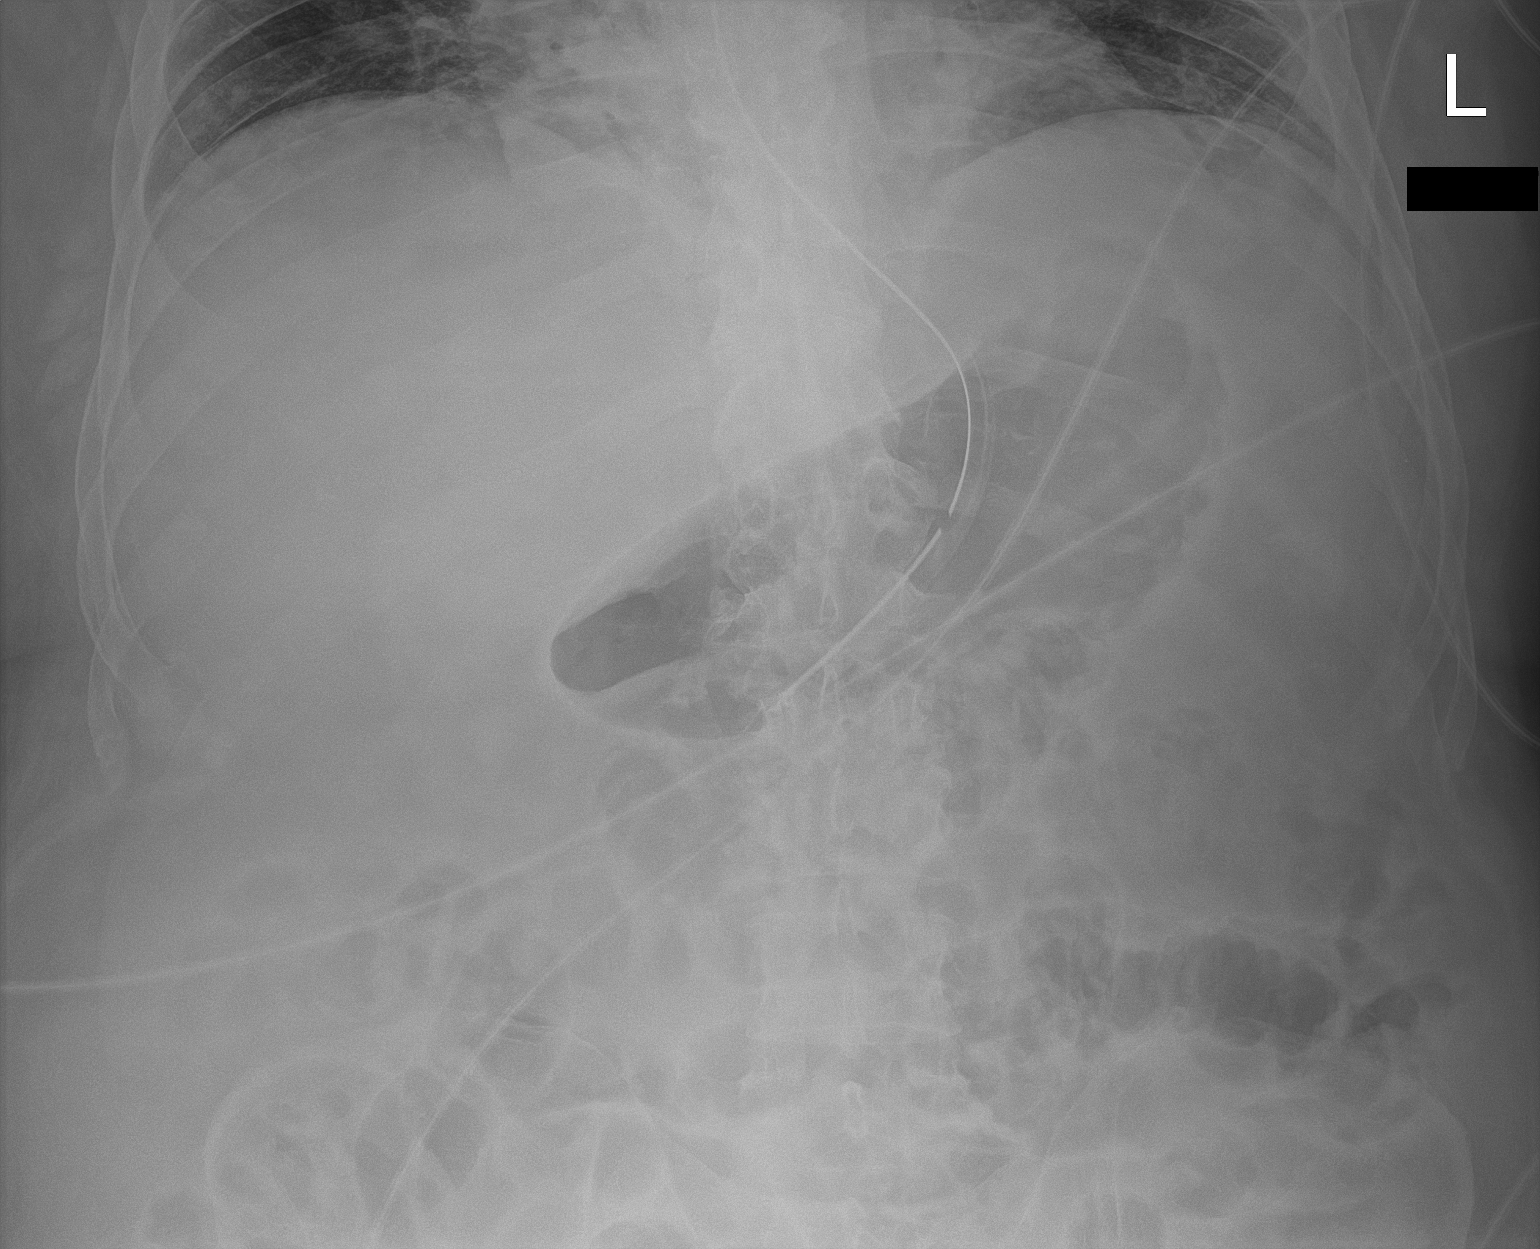

[1 of 1 positions shown; findings below may reference images not displayed]

FINDINGS: The enteric tube tip projects over the distal body of stomach in the
side port is below the level of the GE junction. A few air-filled
loops of small bowel are noted within the upper abdomen which
measure up to 2.8 cm.
IMPRESSION: Satisfactory position of enteric tube.

## 2019-10-16 MED ORDER — NALOXONE HCL 0.4 MG/ML IJ SOLN
INTRAMUSCULAR | Status: AC
  Administered 2019-10-16: 0.4 mg
  Filled 2019-10-16: qty 1

## 2019-10-16 MED ORDER — PREDNISONE 10 MG PO TABS: 10.0000 mg | ORAL_TABLET | Freq: Every day | ORAL | Status: DC

## 2019-10-16 MED ORDER — FAMOTIDINE 40 MG/5ML PO SUSR
20.0000 mg | Freq: Two times a day (BID) | ORAL | Status: DC
  Administered 2019-10-16 – 2019-10-25 (×17): 20 mg
  Filled 2019-10-16 (×19): qty 2.5

## 2019-10-16 MED ORDER — ROCURONIUM BROMIDE 50 MG/5ML IV SOLN
100.0000 mg | Freq: Once | INTRAVENOUS | Status: DC
  Filled 2019-10-16: qty 10

## 2019-10-16 MED ORDER — FREE WATER
300.0000 mL | Status: DC
  Administered 2019-10-16 – 2019-10-22 (×34): 300 mL

## 2019-10-16 MED ORDER — IPRATROPIUM-ALBUTEROL 0.5-2.5 (3) MG/3ML IN SOLN: 3.0000 mL | Freq: Four times a day (QID) | RESPIRATORY_TRACT | Status: DC | PRN

## 2019-10-16 MED ORDER — FENTANYL BOLUS VIA INFUSION
100.0000 ug | INTRAVENOUS | Status: DC | PRN
  Administered 2019-10-16 – 2019-10-21 (×4): 100 ug via INTRAVENOUS
  Filled 2019-10-16: qty 100

## 2019-10-16 MED ORDER — ETOMIDATE 2 MG/ML IV SOLN
20.0000 mg | Freq: Once | INTRAVENOUS | Status: AC
  Administered 2019-10-16: 20 mg via INTRAVENOUS

## 2019-10-16 MED ORDER — FENTANYL CITRATE (PF) 100 MCG/2ML IJ SOLN
200.0000 ug | Freq: Once | INTRAMUSCULAR | Status: AC
  Administered 2019-10-16: 200 ug via INTRAVENOUS
  Filled 2019-10-16: qty 4

## 2019-10-16 MED ORDER — PROPOFOL 1000 MG/100ML IV EMUL
5.0000 ug/kg/min | INTRAVENOUS | Status: DC
  Administered 2019-10-16: 10 ug/kg/min via INTRAVENOUS
  Administered 2019-10-17: 15 ug/kg/min via INTRAVENOUS
  Administered 2019-10-17: 20 ug/kg/min via INTRAVENOUS
  Administered 2019-10-17: 15 ug/kg/min via INTRAVENOUS
  Administered 2019-10-17 – 2019-10-18 (×4): 20 ug/kg/min via INTRAVENOUS
  Administered 2019-10-19: 15 ug/kg/min via INTRAVENOUS
  Administered 2019-10-19: 20 ug/kg/min via INTRAVENOUS
  Administered 2019-10-19: 25 ug/kg/min via INTRAVENOUS
  Administered 2019-10-20 (×2): 20 ug/kg/min via INTRAVENOUS
  Filled 2019-10-16 (×7): qty 100
  Filled 2019-10-16: qty 200
  Filled 2019-10-16 (×5): qty 100

## 2019-10-16 MED ORDER — PREDNISONE 20 MG PO TABS
50.0000 mg | ORAL_TABLET | Freq: Every day | ORAL | Status: AC
  Administered 2019-10-16 – 2019-10-21 (×6): 50 mg
  Filled 2019-10-16 (×5): qty 1

## 2019-10-16 MED ORDER — POTASSIUM CHLORIDE 20 MEQ PO PACK
40.0000 meq | PACK | Freq: Two times a day (BID) | ORAL | Status: AC
  Administered 2019-10-16 (×2): 40 meq
  Filled 2019-10-16 (×2): qty 2

## 2019-10-16 MED ORDER — POTASSIUM CHLORIDE 10 MEQ/100ML IV SOLN: 10.0000 meq | INTRAVENOUS | Status: DC

## 2019-10-16 NOTE — Progress Notes (Signed)
Patient self extubated about 4:30 am. Patient re-intubated.

## 2019-10-16 NOTE — Progress Notes (Addendum)
NAME:  Micheal Bell, MRN:  [id number], DOB:  [birth year], LOS: 4 ADMISSION DATE:  10/09/2019, CONSULTATION DATE:  8/19  REFERRING MD:  Leafy Half, CHIEF COMPLAINT:  COVID 49   Brief History   49 year old male admitted 8/16 for COVID 19 pneumonia. 8/19 early AM decompensated requiring up to 70 L HHFNC and NRB with O2 sats in the 70s and complaints of "wearing out" PCCM consulted.  Also treated for staph bacteremia and UTI. Partially vaccinated, first dose of vaccine was on 8/10. Developed cold symptoms on 8/11.  Past Medical History  Hypertension Gout DM2 CHF Asthma Irritable bowel syndrome fibromyalgia   Significant Hospital Events   8/16 present to [facility], COVID pos on 4L 8/19 transfer to [facility] on 70L HF and NRB, transferred to ICU due to respiratory fatigue, intubated, prone 8/20 prone, diuresed  Consults:    Procedures:  8/19 ETT >  8/19 R arm PICC >  8/23 self extubation and reintubation  Significant Diagnostic Tests:  8/19 TTE > LVEF 30-35%. LVEF appears to be depressed with  diffuse hypokinesis, worse in the mid/distal inferior/inferoseptal walls.   8/20 left foot X -ray healing fractures of left foot (4-5th metatarsal)  Micro Data:  8/16 SARS COV 2 > positive 8/16 Urine > MSSA 8/16 Blood > staph aureus 2/2, group B strep 2/2 8/19 wound> staph aureus  Antimicrobials/COVID Rx:  8/16 remdesivir >  8/19 Baricitinib >  8/19 solumedrol >   ========  8/17 ceftriaxone > 8/19 8/17 vanc > 8/19 8/19 cefazolin >  Interim history/subjective:  Overnight: Patient self extubated.  Given agitation and delirium not controlled with Precedex, he was reintubated  Patient is seen at bedside.  He is sedated and minimally responsive.  Blood sugars is well controlled with insulin drip.  Objective   Blood pressure 129/75, pulse 72, temperature 99.3 F (37.4 C), resp. rate (!) 22, height 6\' 2"  (1.88 m), weight 117.7 kg, SpO2 95 %. CVP:  [9 mmHg-14 mmHg] 10 mmHg  Vent  Mode: PRVC FiO2 (%):  [40 %-100 %] 70 % Set Rate:  [22 bmp] 22 bmp Vt Set:  [580 mL] 580 mL PEEP:  [8 cmH20-10 cmH20] 10 cmH20 Plateau Pressure:  [22 cmH20-27 cmH20] 27 cmH20   Intake/Output Summary (Last 24 hours) at 10/16/2019 1244 Last data filed at 10/16/2019 1100 Gross per 24 hour  Intake 2969.66 ml  Output 3250 ml  Net -280.34 ml   Filed Weights   10/14/19 0401 10/15/19 0229 10/16/19 0500  Weight: 121.9 kg 122.5 kg 117.7 kg    Examination:  Physical Exam Constitutional:      General: He is not in acute distress.    Comments: Sedated  HENT:     Head: Normocephalic and atraumatic.     Mouth/Throat:     Comments: Intubated Cardiovascular:     Rate and Rhythm: Normal rate and regular rhythm.     Heart sounds: Normal heart sounds. No murmur heard.   Pulmonary:     Comments: Diminished lung sound at bilateral lung bases Abdominal:     General: Bowel sounds are normal.     Palpations: Abdomen is soft.  Musculoskeletal:     Right lower leg: No edema.     Left lower leg: No edema.     Comments: A circular wound at left foot, no purulent discharge.  Neurological:     Comments: Sedated, minimally responsive to physical stimulation     8/23 CXR > persistent bilateral infiltrates consistent with  multifocal pneumonia, not improved since 8/22  Resolved Hospital Problem list     Assessment & Plan:  Acute hypoxic respiratory failure requiring mechanical ventilation COVID-19 pneumonia Acute on chronic congestive heart failure MSSA bacteremia Diabetic foot wound Hypernatremia Hypokalemia Diabetes with hyperglycemia   Plan:  Acute hypoxic respiratory failure requiring mechanical ventilation COVID-19 pneumonia Underlying asthma Continue to wean off of ventilator as tolerated.  Chest x-ray still shows persistent bilateral infiltrates which is not improved. PRVC/rate 22/VT 580/PEEP 10/FiO2 70% -Continue mechanical ventilation per ARDS protocol -Target TVol  6-8cc/kgIBW -Target Plateau Pressure < 30cm H20 -Target driving pressure less than 15 cm of water -Target PaO2 55-65: titrate PEEP/FiO2 per protocol -As long as PaO2 to FiO2 ratio is less than 1:150 position in prone position for 16 hours a day Check CVP daily if CVL in place -Target CVP less than 4, diurese as necessary -Ventilator associated pneumonia prevention protocol -Sedation with Precedex and fentanyl.  Wean as tolerated.  Clonazepam was added.  Target RASS score -2 -Scheduled duo nebs -Continue prednisone and baricitinib per protocol   Acute on chronic systolic CHF Echo 8/19 shows EF of 30 to 35% with regional wall motion abnormalities.  He got Lasix yesterday with good urine output.  Creatinine is normal.  Patient is euvolemic on exam.  We will hold Lasix for today   MSSA bacteremia Diabetic foot wound ID is on board.  Blood culture collected on 8/19 was negative.  Continue cefazolin for MSSA bacteremia.  Unclear if there is osteomyelitis involvement. -Continue cefazolin   Hypernatremia  Hypokalemia Sodium [phone].  Will increase free water flushes to 300 cc every 4 hours -Monitor BMET and UOP -Replace electrolytes as needed -Free water 300 cc every 4 hours   DM2 with difficult to control hyperglycemia -Continue insulin drip as poorly controlled on sub cutaneous -Stopping solumedrol, changing to prednisone -Can consider switch to injection when able  Best practice:  Diet: tube feeding Pain/Anxiety/Delirium protocol (if indicated): Fentanyl and Precedex VAP protocol (if indicated): yes DVT prophylaxis: lovenox GI prophylaxis:  famotidine Glucose control: Insulin drip Mobility: bed rest Code Status: full Family Communication: update family Disposition: remain in ICU for ventilator management  Critical care time: 30 minutes    Doran Stabler, DO Internal Medicine Residency My pager: [phone]   PCCM attending:   49 yo covid ARDs, BL PNA, complicated by  staph bacteremia, diabetic foot wound.  BP (!) 145/80   Pulse 66   Temp 99 F (37.2 C) (Oral)   Resp (!) 22   Ht 6\' 2"  (1.88 m)   Wt 117.7 kg   SpO2 97%   BMI 33.32 kg/m   Gen: elderly male, intuabted on life support  Heart: RRR s1 s2 Lungs: BL vented breaths   Labs reviewed  CXR: bl infiltrates, The patient's images have been independently reviewed by me.    A:  ARDS, COVID19 PNA  AoC systolic HF  Staph bacteremia  Diabetic foot wound  Asthma  Hyponatremia  Hyperglycemia, DM2  P: LTVV strategy  Tapering prednisone  Continue barcitinib  Stopped versed  precedex for sedation  Lasix for diuresis   This patient is critically ill with multiple organ system failure; which, requires frequent high complexity decision making, assessment, support, evaluation, and titration of therapies. This was completed through the application of advanced monitoring technologies and extensive interpretation of multiple databases. During this encounter critical care time was devoted to patient care services described in this note for 32 minutes.  ,  DO [facility] 10/16/2019 4:22 PM

## 2019-10-16 NOTE — Procedures (Signed)
Intubation Procedure Note  Micheal Bell  [id number]  1970-05-06  Date:10/16/19  Time:7:14 AM   Provider Performing:Shandie Bertz Marcos Eke    Procedure: Intubation (31500)  Indication(s) Respiratory Failure  Consent Unable to obtain consent due to emergent nature of procedure.   Anesthesia Fentanyl and etomidate   Time Out Verified patient identification, verified procedure, site/side was marked, verified correct patient position, special equipment/implants available, medications/allergies/relevant history reviewed, required imaging and test results available.   Sterile Technique Usual hand hygeine, masks, and gloves were used   Procedure Description Patient positioned in bed supine.  Sedation given as noted above.  Patient was intubated with endotracheal tube using Glidescope.  View was Grade 1 full glottis .  Number of attempts was 1.  Colorimetric CO2 detector was consistent with tracheal placement.   Complications/Tolerance None; patient tolerated the procedure well. Chest X-ray is ordered to verify placement.   EBL none   Specimen(s) None

## 2019-10-16 NOTE — Progress Notes (Signed)
[facility] for Infectious Disease    Date of Admission:  10/09/2019   Total days of antibiotics 8          ID: Micheal Bell is a 49 y.o. male with MSSA and group B streptococcal bacteremia and severe COVID illness with respiratory distress requiring ventilator Active Problems:   COVID-19 virus infection   Hyperglycemia   Hypoxia    Subjective: Had fever of 101F overnight, self extubated in the last 24hr requiring reintubation and  sedated, vent settings at [facility] of 60%, not needing pressors  Medications:  . baricitinib  4 mg Per Tube Daily  . chlorhexidine gluconate (MEDLINE KIT)  15 mL Mouth Rinse BID  . Chlorhexidine Gluconate Cloth  6 each Topical Daily  . clonazepam  2 mg Per Tube BID  . docusate  100 mg Per Tube BID  . famotidine  20 mg Per Tube BID  . feeding supplement (PROSource TF)  45 mL Per Tube QID  . free water  300 mL Per Tube Q4H  . mouth rinse  15 mL Mouth Rinse 10 times per day  . metoprolol tartrate  2.5 mg Intravenous Q6H  . polyethylene glycol  17 g Per Tube Daily  . potassium chloride  40 mEq Per Tube BID  . [START ON 10/22/2019] predniSONE  10 mg Oral Q breakfast  . predniSONE  50 mg Per Tube Q breakfast  . rocuronium  100 mg Intravenous Once  . sodium chloride flush  10-40 mL Intracatheter Q12H    Objective: Vital signs in last 24 hours: Temp:  [97.6 F (36.4 C)-101.5 F (38.6 C)] 97.6 F (36.4 C) (08/23 1600) Pulse Rate:  [64-108] 102 (08/23 1600) Resp:  [21-24] 21 (08/23 1600) BP: (109-183)/(67-92) 172/89 (08/23 1600) SpO2:  [88 %-100 %] 94 % (08/23 1600) FiO2 (%):  [40 %-100 %] 60 % (08/23 1600) Weight:  [117.7 kg] 117.7 kg (08/23 0500) Physical Exam  Constitutional: He is sedated. He appears well-developed and well-nourished. No distress.  HENT:  Mouth/Throat: OETT in place.  No oropharyngeal exudate.  Cardiovascular: Normal rate, regular rhythm and normal heart sounds. Exam reveals no gallop and no friction rub.  No murmur heard.   Pulmonary/Chest: Effort normal and breath sounds normal. No respiratory distress. He has no wheezes.  Abdominal: Soft. Bowel sounds are normal. He exhibits no distension. There is no tenderness.  Skin: Skin is warm and dry. No rash noted. No erythema. Left foot swollen, mild serous drainage from plantar wound     Lab Results Recent Labs    10/15/19 0430 10/15/19 0430 10/16/19 0307 10/16/19 0500  WBC 11.6*  --   --  14.1*  HGB 10.9*   < > 12.6* 12.3*  HCT 32.6*   < > 37.0* 38.8*  NA 146*   < > 148* 149*  K 4.0   < > 3.6 3.4*  CL 102  --   --  102  CO2 33*  --   --  35*  BUN 51*  --   --  51*  CREATININE 0.96  --   --  0.99   < > = values in this interval not displayed.   Liver Panel Recent Labs    10/15/19 0430 10/16/19 0500  PROT 5.6* 6.0*  ALBUMIN 1.8* 2.0*  AST 41 73*  ALT 21 34  ALKPHOS 108 118  BILITOT 0.4 0.8    Microbiology:  Studies/Results: DG CHEST PORT 1 VIEW  Result Date: 10/16/2019 CLINICAL  DATA:  Status post intubation. EXAM: PORTABLE CHEST 1 VIEW COMPARISON:  10/14/2019 FINDINGS: ET tube tip is above the carina. There is a right arm PICC line with tip at the cavoatrial junction. Lung volumes are low. Similar appearance of bilateral pulmonary opacities compatible with multifocal pneumonia. IMPRESSION: 1. Stable support apparatus. 2. Persistent bilateral pulmonary opacities compatible with multifocal pneumonia. Electronically Signed   By: Kerby Moors M.D.   On: 10/16/2019 06:14   DG Chest Port 1 View  Result Date: 10/15/2019 CLINICAL DATA:  49 year old male with history of COVID infection. EXAM: PORTABLE CHEST 1 VIEW COMPARISON:  Chest x-ray 10/14/2019. FINDINGS: An endotracheal tube is in place with tip 3.6 cm above the carina. A nasogastric tube is seen extending into the stomach, however, the tip of the nasogastric tube extends below the lower margin of the image. There is a right upper extremity PICC with tip terminating in the right atrium. Lung  volumes are low. Widespread patchy areas of interstitial prominence an ill-defined airspace disease again noted throughout the lungs bilaterally. Overall, aeration is essentially unchanged. No pleural effusions. No evidence of pulmonary edema. No pneumothorax. Heart size is normal. Upper mediastinal contours are within normal limits. IMPRESSION: 1. Support apparatus, as above. 2. The appearance the chest is compatible with multilobar pneumonia from reported COVID infection. Electronically Signed   By: Vinnie Langton M.D.   On: 10/15/2019 05:39   DG Abd Portable 1V  Result Date: 10/16/2019 CLINICAL DATA:  Status post OG tube placement EXAM: PORTABLE ABDOMEN - 1 VIEW COMPARISON:  10/16/2019 FINDINGS: The enteric tube tip projects over the distal body of stomach in the side port is below the level of the GE junction. A few air-filled loops of small bowel are noted within the upper abdomen which measure up to 2.8 cm. IMPRESSION: Satisfactory position of enteric tube. Electronically Signed   By: Kerby Moors M.D.   On: 10/16/2019 06:52     Assessment/Plan: Fever with leukocytosis = concern that we don't have source control. Recommend to repeat blood cx is still spiking fevers. Continue on cefazolin to cover MSSA and group B streptococcal bacteremia  dfu = appears unchanged. Eventually will need CT or MRI to decide if needs debridement and would help with length of therapy  covid-19= has finished remdesivir, continue on barticitinib and prednisone  IDDM = continue on insulin drip.  [facility] for Infectious Diseases Cell: [phone] Pager: [phone]  10/16/2019, 5:52 PM

## 2019-10-16 NOTE — Progress Notes (Signed)
eLink Physician-Brief Progress Note Patient Name: Micheal Bell DOB: 04-09-1970 MRN: [id number]   Date of Service  10/16/2019  HPI/Events of Note  Around 45 min ago, I was notified patient self extubated. Seen on camera, ETT was almost fully out, O2 sats were in 70s.   eICU Interventions  Tube removed and external O2 given and O2 sat up to 89. Bedside ICU team notified and promptly arrived to assess need for re intubation.      Intervention Category Major Interventions: Respiratory failure - evaluation and management  [name] [name] 10/16/2019, 5:18 AM

## 2019-10-16 NOTE — Progress Notes (Signed)
Called to bedside for extubation  Patient satting in mid to high 80s.  occasioonally in low 90s on HFNC. Strong cough, +gag, but Drowsy.  Non verbal.  Agitated with narcan, responsive, but remains very agitated.   Remained too agitated and delirious even with precedex.   Ultimately decided to proceed with reintubation.

## 2019-10-17 LAB — CULTURE, BLOOD (ROUTINE X 2)
Culture: NO GROWTH
Culture: NO GROWTH
Special Requests: ADEQUATE
Special Requests: ADEQUATE

## 2019-10-17 LAB — CBC
HCT: 38.2 % — ABNORMAL LOW (ref 39.0–52.0)
Hemoglobin: 12 g/dL — ABNORMAL LOW (ref 13.0–17.0)
MCH: 30.5 pg (ref 26.0–34.0)
MCHC: 31.4 g/dL (ref 30.0–36.0)
MCV: 97.2 fL (ref 80.0–100.0)
Platelets: 226 10*3/uL (ref 150–400)
RBC: 3.93 MIL/uL — ABNORMAL LOW (ref 4.22–5.81)
RDW: 13.2 % (ref 11.5–15.5)
WBC: 14 10*3/uL — ABNORMAL HIGH (ref 4.0–10.5)
nRBC: 0.2 % (ref 0.0–0.2)

## 2019-10-17 LAB — GLUCOSE, CAPILLARY
Glucose-Capillary: 189 mg/dL — ABNORMAL HIGH (ref 70–99)
Glucose-Capillary: 158 mg/dL — ABNORMAL HIGH (ref 70–99)
Glucose-Capillary: 153 mg/dL — ABNORMAL HIGH (ref 70–99)
Glucose-Capillary: 193 mg/dL — ABNORMAL HIGH (ref 70–99)
Glucose-Capillary: 168 mg/dL — ABNORMAL HIGH (ref 70–99)
Glucose-Capillary: 136 mg/dL — ABNORMAL HIGH (ref 70–99)
Glucose-Capillary: 229 mg/dL — ABNORMAL HIGH (ref 70–99)
Glucose-Capillary: 188 mg/dL — ABNORMAL HIGH (ref 70–99)
Glucose-Capillary: 126 mg/dL — ABNORMAL HIGH (ref 70–99)
Glucose-Capillary: 177 mg/dL — ABNORMAL HIGH (ref 70–99)
Glucose-Capillary: 215 mg/dL — ABNORMAL HIGH (ref 70–99)
Glucose-Capillary: 222 mg/dL — ABNORMAL HIGH (ref 70–99)
Glucose-Capillary: 204 mg/dL — ABNORMAL HIGH (ref 70–99)
Glucose-Capillary: 278 mg/dL — ABNORMAL HIGH (ref 70–99)
Glucose-Capillary: 283 mg/dL — ABNORMAL HIGH (ref 70–99)
Glucose-Capillary: 221 mg/dL — ABNORMAL HIGH (ref 70–99)

## 2019-10-17 LAB — MRSA PCR SCREENING: MRSA by PCR: NEGATIVE

## 2019-10-17 LAB — AEROBIC CULTURE W GRAM STAIN (SUPERFICIAL SPECIMEN): Special Requests: NORMAL

## 2019-10-17 LAB — BASIC METABOLIC PANEL
Anion gap: 9 (ref 5–15)
BUN: 35 mg/dL — ABNORMAL HIGH (ref 6–20)
CO2: 33 mmol/L — ABNORMAL HIGH (ref 22–32)
Calcium: 8.5 mg/dL — ABNORMAL LOW (ref 8.9–10.3)
Chloride: 104 mmol/L (ref 98–111)
Creatinine, Ser: 0.65 mg/dL (ref 0.61–1.24)
GFR calc Af Amer: >60 mL/min (ref >60)
GFR calc non Af Amer: >60 mL/min (ref >60)
Glucose, Bld: 200 mg/dL — ABNORMAL HIGH (ref 70–99)
Potassium: 4.1 mmol/L (ref 3.5–5.1)
Sodium: 146 mmol/L — ABNORMAL HIGH (ref 135–145)

## 2019-10-17 MED ORDER — INSULIN ASPART 100 UNIT/ML ~~LOC~~ SOLN
4.0000 [IU] | SUBCUTANEOUS | Status: DC
  Administered 2019-10-17: 4 [IU] via SUBCUTANEOUS

## 2019-10-17 MED ORDER — FUROSEMIDE 10 MG/ML IJ SOLN
40.0000 mg | Freq: Every day | INTRAMUSCULAR | Status: DC
  Administered 2019-10-17 – 2019-10-23 (×7): 40 mg via INTRAVENOUS
  Filled 2019-10-17 (×7): qty 4

## 2019-10-17 MED ORDER — INSULIN GLARGINE 100 UNIT/ML ~~LOC~~ SOLN
30.0000 [IU] | Freq: Two times a day (BID) | SUBCUTANEOUS | Status: DC
  Administered 2019-10-17 (×2): 30 [IU] via SUBCUTANEOUS
  Filled 2019-10-17 (×4): qty 0.3

## 2019-10-17 MED ORDER — INSULIN ASPART 100 UNIT/ML ~~LOC~~ SOLN
0.0000 [IU] | Freq: Three times a day (TID) | SUBCUTANEOUS | Status: DC
  Administered 2019-10-17: 5 [IU] via SUBCUTANEOUS

## 2019-10-17 MED ORDER — ENOXAPARIN SODIUM 60 MG/0.6ML ~~LOC~~ SOLN
60.0000 mg | Freq: Every day | SUBCUTANEOUS | Status: DC
  Administered 2019-10-17 – 2019-10-26 (×10): 60 mg via SUBCUTANEOUS
  Filled 2019-10-17 (×12): qty 0.6

## 2019-10-17 MED ORDER — INSULIN ASPART 100 UNIT/ML ~~LOC~~ SOLN
0.0000 [IU] | SUBCUTANEOUS | Status: DC
  Administered 2019-10-17 (×2): 8 [IU] via SUBCUTANEOUS
  Administered 2019-10-18 (×2): 3 [IU] via SUBCUTANEOUS
  Administered 2019-10-18: 5 [IU] via SUBCUTANEOUS
  Administered 2019-10-18: 3 [IU] via SUBCUTANEOUS
  Administered 2019-10-18: 8 [IU] via SUBCUTANEOUS
  Administered 2019-10-18: 5 [IU] via SUBCUTANEOUS
  Administered 2019-10-19: 2 [IU] via SUBCUTANEOUS
  Administered 2019-10-19 (×3): 8 [IU] via SUBCUTANEOUS
  Administered 2019-10-19: 5 [IU] via SUBCUTANEOUS
  Administered 2019-10-20: 8 [IU] via SUBCUTANEOUS
  Administered 2019-10-20: 5 [IU] via SUBCUTANEOUS
  Administered 2019-10-20: 3 [IU] via SUBCUTANEOUS
  Administered 2019-10-20: 5 [IU] via SUBCUTANEOUS
  Administered 2019-10-20: 8 [IU] via SUBCUTANEOUS
  Administered 2019-10-20 – 2019-10-21 (×2): 5 [IU] via SUBCUTANEOUS
  Administered 2019-10-21: 3 [IU] via SUBCUTANEOUS
  Administered 2019-10-21: 2 [IU] via SUBCUTANEOUS
  Administered 2019-10-21 (×2): 3 [IU] via SUBCUTANEOUS
  Administered 2019-10-21: 5 [IU] via SUBCUTANEOUS
  Administered 2019-10-22 (×3): 3 [IU] via SUBCUTANEOUS
  Administered 2019-10-22: 2 [IU] via SUBCUTANEOUS
  Administered 2019-10-22: 5 [IU] via SUBCUTANEOUS
  Administered 2019-10-23: 3 [IU] via SUBCUTANEOUS
  Administered 2019-10-23 (×4): 8 [IU] via SUBCUTANEOUS
  Administered 2019-10-23 – 2019-10-25 (×5): 3 [IU] via SUBCUTANEOUS
  Administered 2019-10-25: 8 [IU] via SUBCUTANEOUS
  Administered 2019-10-25: 5 [IU] via SUBCUTANEOUS
  Administered 2019-10-25: 2 [IU] via SUBCUTANEOUS
  Administered 2019-10-26: 8 [IU] via SUBCUTANEOUS
  Administered 2019-10-26: 2 [IU] via SUBCUTANEOUS
  Administered 2019-10-26: 5 [IU] via SUBCUTANEOUS
  Administered 2019-10-26: 8 [IU] via SUBCUTANEOUS
  Administered 2019-10-27: 3 [IU] via SUBCUTANEOUS
  Administered 2019-10-27: 5 [IU] via SUBCUTANEOUS
  Administered 2019-10-27: 11 [IU] via SUBCUTANEOUS
  Administered 2019-10-27: 5 [IU] via SUBCUTANEOUS
  Administered 2019-10-27: 2 [IU] via SUBCUTANEOUS
  Administered 2019-10-28: 5 [IU] via SUBCUTANEOUS
  Administered 2019-10-28: 2 [IU] via SUBCUTANEOUS
  Administered 2019-10-28: 8 [IU] via SUBCUTANEOUS
  Administered 2019-10-28 (×2): 5 [IU] via SUBCUTANEOUS
  Administered 2019-10-29: 2 [IU] via SUBCUTANEOUS
  Administered 2019-10-29: 4 [IU] via SUBCUTANEOUS
  Administered 2019-10-29: 3 [IU] via SUBCUTANEOUS
  Administered 2019-10-29 (×2): 5 [IU] via SUBCUTANEOUS
  Administered 2019-10-30: 2 [IU] via SUBCUTANEOUS
  Administered 2019-10-30: 5 [IU] via SUBCUTANEOUS
  Administered 2019-10-30: 2 [IU] via SUBCUTANEOUS
  Administered 2019-10-30: 3 [IU] via SUBCUTANEOUS
  Administered 2019-10-30: 5 [IU] via SUBCUTANEOUS
  Administered 2019-10-30: 3 [IU] via SUBCUTANEOUS
  Administered 2019-10-31 (×2): 2 [IU] via SUBCUTANEOUS
  Administered 2019-10-31 (×2): 3 [IU] via SUBCUTANEOUS
  Administered 2019-10-31: 5 [IU] via SUBCUTANEOUS
  Administered 2019-11-01: 2 [IU] via SUBCUTANEOUS
  Administered 2019-11-01 (×3): 3 [IU] via SUBCUTANEOUS
  Administered 2019-11-01 (×2): 5 [IU] via SUBCUTANEOUS
  Administered 2019-11-02 (×2): 3 [IU] via SUBCUTANEOUS
  Administered 2019-11-02: 2 [IU] via SUBCUTANEOUS
  Administered 2019-11-02 – 2019-11-03 (×5): 3 [IU] via SUBCUTANEOUS
  Administered 2019-11-03: 2 [IU] via SUBCUTANEOUS
  Administered 2019-11-03 – 2019-11-04 (×2): 3 [IU] via SUBCUTANEOUS
  Administered 2019-11-04: 2 [IU] via SUBCUTANEOUS
  Administered 2019-11-04: 5 [IU] via SUBCUTANEOUS
  Administered 2019-11-04: 2 [IU] via SUBCUTANEOUS
  Administered 2019-11-04 (×2): 3 [IU] via SUBCUTANEOUS
  Administered 2019-11-05: 2 [IU] via SUBCUTANEOUS
  Administered 2019-11-05 (×2): 3 [IU] via SUBCUTANEOUS
  Administered 2019-11-05: 5 [IU] via SUBCUTANEOUS
  Administered 2019-11-05 – 2019-11-06 (×3): 3 [IU] via SUBCUTANEOUS
  Administered 2019-11-06: 2 [IU] via SUBCUTANEOUS
  Administered 2019-11-06: 3 [IU] via SUBCUTANEOUS
  Administered 2019-11-06: 2 [IU] via SUBCUTANEOUS
  Administered 2019-11-06: 3 [IU] via SUBCUTANEOUS
  Administered 2019-11-06 – 2019-11-07 (×6): 2 [IU] via SUBCUTANEOUS
  Administered 2019-11-07: 3 [IU] via SUBCUTANEOUS
  Administered 2019-11-08: 2 [IU] via SUBCUTANEOUS
  Administered 2019-11-08 (×2): 3 [IU] via SUBCUTANEOUS
  Administered 2019-11-08 (×2): 2 [IU] via SUBCUTANEOUS
  Administered 2019-11-08 – 2019-11-09 (×2): 3 [IU] via SUBCUTANEOUS
  Administered 2019-11-09: 2 [IU] via SUBCUTANEOUS
  Administered 2019-11-09: 5 [IU] via SUBCUTANEOUS
  Administered 2019-11-09: 2 [IU] via SUBCUTANEOUS
  Administered 2019-11-09 (×2): 3 [IU] via SUBCUTANEOUS
  Administered 2019-11-10: 5 [IU] via SUBCUTANEOUS
  Administered 2019-11-10 (×3): 2 [IU] via SUBCUTANEOUS

## 2019-10-17 MED ORDER — INSULIN ASPART 100 UNIT/ML ~~LOC~~ SOLN
6.0000 [IU] | SUBCUTANEOUS | Status: DC
  Administered 2019-10-17 – 2019-10-19 (×11): 6 [IU] via SUBCUTANEOUS

## 2019-10-17 MED ORDER — SENNOSIDES 8.8 MG/5ML PO SYRP
10.0000 mL | ORAL_SOLUTION | Freq: Two times a day (BID) | ORAL | Status: DC
  Administered 2019-10-17 – 2019-10-21 (×8): 10 mL
  Filled 2019-10-17 (×16): qty 10

## 2019-10-17 NOTE — Progress Notes (Signed)
[facility] for Infectious Disease    Date of Admission:  10/09/2019   Total days of antibiotics 9           ID: Micheal Bell is a 49 y.o. male with  MSSA/Group b Strep bacteremia plus  Active Problems:   COVID-19 virus infection   Hyperglycemia   Hypoxia    Subjective: Afebrile, remains sedated and intubated  Medications:  . baricitinib  4 mg Per Tube Daily  . chlorhexidine gluconate (MEDLINE KIT)  15 mL Mouth Rinse BID  . Chlorhexidine Gluconate Cloth  6 each Topical Daily  . clonazepam  2 mg Per Tube BID  . docusate  100 mg Per Tube BID  . enoxaparin (LOVENOX) injection  60 mg Subcutaneous Daily  . famotidine  20 mg Per Tube BID  . feeding supplement (PROSource TF)  45 mL Per Tube QID  . free water  300 mL Per Tube Q4H  . furosemide  40 mg Intravenous Daily  . insulin aspart  0-15 Units Subcutaneous Q4H  . insulin aspart  6 Units Subcutaneous Q4H  . insulin glargine  30 Units Subcutaneous BID  . mouth rinse  15 mL Mouth Rinse 10 times per day  . metoprolol tartrate  2.5 mg Intravenous Q6H  . polyethylene glycol  17 g Per Tube Daily  . [START ON 10/22/2019] predniSONE  10 mg Oral Q breakfast  . predniSONE  50 mg Per Tube Q breakfast  . rocuronium  100 mg Intravenous Once  . sennosides  10 mL Per Tube BID  . sodium chloride flush  10-40 mL Intracatheter Q12H    Objective: Vital signs in last 24 hours: Temp:  [97.8 F (36.6 C)-99.9 F (37.7 C)] 97.8 F (36.6 C) (08/24 1531) Pulse Rate:  [61-117] 72 (08/24 1600) Resp:  [13-27] 15 (08/24 1600) BP: (111-156)/(66-99) 126/75 (08/24 1600) SpO2:  [89 %-95 %] 91 % (08/24 1600) FiO2 (%):  [50 %-60 %] 50 % (08/24 1503) Weight:  [117.5 kg] 117.5 kg (08/24 0500) Physical Exam  Constitutional: sedated. Intubated. He appears well-developed and well-nourished. No distress.  HENT:  OETT in place, no scleral icterus. No oropharyngeal exudate.  Cardiovascular: Normal rate, regular rhythm and normal heart sounds. Exam  reveals no gallop and no friction rub.  No murmur heard.  Pulmonary/Chest: Effort normal and breath sounds normal. No respiratory distress. He has no wheezes.  Abdominal: Soft. Bowel sounds are normal. He exhibits no distension. There is no tenderness.  Ext: right foot cool, left foot warm to touch, swollen     Lab Results Recent Labs    10/16/19 0500 10/17/19 0500  WBC 14.1* 14.0*  HGB 12.3* 12.0*  HCT 38.8* 38.2*  NA 149* 146*  K 3.4* 4.1  CL 102 104  CO2 35* 33*  BUN 51* 35*  CREATININE 0.99 0.65   Liver Panel Recent Labs    10/15/19 0430 10/16/19 0500  PROT 5.6* 6.0*  ALBUMIN 1.8* 2.0*  AST 41 73*  ALT 21 34  ALKPHOS 108 118  BILITOT 0.4 0.8   Sedimentation Rate No results for input(s): ESRSEDRATE in the last 72 hours. C-Reactive Protein No results for input(s): CRP in the last 72 hours.  Microbiology: 8/19 MRSA from wound  Studies/Results: DG CHEST PORT 1 VIEW  Result Date: 10/16/2019 CLINICAL DATA:  Status post intubation. EXAM: PORTABLE CHEST 1 VIEW COMPARISON:  10/14/2019 FINDINGS: ET tube tip is above the carina. There is a right arm PICC line with tip  at the cavoatrial junction. Lung volumes are low. Similar appearance of bilateral pulmonary opacities compatible with multifocal pneumonia. IMPRESSION: 1. Stable support apparatus. 2. Persistent bilateral pulmonary opacities compatible with multifocal pneumonia. Electronically Signed   By: Kerby Moors M.D.   On: 10/16/2019 06:14   DG Abd Portable 1V  Result Date: 10/16/2019 CLINICAL DATA:  Status post OG tube placement EXAM: PORTABLE ABDOMEN - 1 VIEW COMPARISON:  10/16/2019 FINDINGS: The enteric tube tip projects over the distal body of stomach in the side port is below the level of the GE junction. A few air-filled loops of small bowel are noted within the upper abdomen which measure up to 2.8 cm. IMPRESSION: Satisfactory position of enteric tube. Electronically Signed   By: Kerby Moors M.D.   On:  10/16/2019 06:52     Assessment/Plan: MSSA bacteremia and group b strep bacteremia likely secondary to left foot DFU = continue on cefazolin  covid pneumonia = currently on  steroids  [facility] for Infectious Diseases Cell: [phone] Pager: [phone]  10/17/2019, 6:15 PM

## 2019-10-17 NOTE — Progress Notes (Signed)
Spoke with patient's sister and mother and provided full update/answered all questions.

## 2019-10-17 NOTE — Progress Notes (Addendum)
NAME:  Micheal Bell, MRN:  [id number], DOB:  [birth year], LOS: 5 ADMISSION DATE:  10/09/2019, CONSULTATION DATE:  8/19  REFERRING MD:  Leafy Half, CHIEF COMPLAINT:  COVID 68   Brief History   49 year old male admitted 8/16 for COVID 19 pneumonia. 8/19 early AM decompensated requiring up to 70 L HHFNC and NRB with O2 sats in the 70s and complaints of "wearing out" PCCM consulted.  Also treated for staph bacteremia and UTI. Partially vaccinated, first dose of vaccine was on 8/10. Developed cold symptoms on 8/11.  Past Medical History  Hypertension Gout DM2 CHF Asthma Irritable bowel syndrome fibromyalgia   Significant Hospital Events   8/16 present to [facility], COVID pos on 4L 8/19 transfer to [facility] on 70L HF and NRB, transferred to ICU due to respiratory fatigue, intubated, prone 8/20 prone, diuresed  Consults:    Procedures:  8/19 ETT >  8/19 R arm PICC >  8/23 self extubation and reintubation  Significant Diagnostic Tests:  8/19 TTE > LVEF 30-35%. LVEF appears to be depressed with  diffuse hypokinesis, worse in the mid/distal inferior/inferoseptal walls.   8/20 left foot X -ray healing fractures of left foot (4-5th metatarsal)  Micro Data:  8/16 SARS COV 2 > positive 8/16 Urine > MSSA 8/16 Blood > staph aureus 2/2, group B strep 2/2 8/19 wound> staph aureus  Antimicrobials/COVID Rx:  8/16 remdesivir >  8/19 Baricitinib >  8/19 solumedrol >   ========  8/17 ceftriaxone > 8/19 8/17 vanc > 8/19 8/19 cefazolin >  Interim history/subjective:  No acute event overnight  Patient is seen at bedside.  He is sedated and minimally responsive.  Still spiking fever last 24 hours.  Transit to injectable insulin.  Objective   Blood pressure 116/73, pulse 66, temperature 98.2 F (36.8 C), temperature source Axillary, resp. rate 13, height 6\' 2"  (1.88 m), weight 117.5 kg, SpO2 (!) 89 %. CVP:  [10 mmHg] 10 mmHg  Vent Mode: PRVC FiO2 (%):  [50 %-70 %] 50 % Set Rate:  [22  bmp] 22 bmp Vt Set:  [580 mL] 580 mL PEEP:  [10 cmH20] 10 cmH20 Plateau Pressure:  [23 cmH20-27 cmH20] 24 cmH20   Intake/Output Summary (Last 24 hours) at 10/17/2019 1017 Last data filed at 10/17/2019 1000 Gross per 24 hour  Intake 2964.99 ml  Output 2560 ml  Net 404.99 ml   Filed Weights   10/15/19 0229 10/16/19 0500 10/17/19 0500  Weight: 122.5 kg 117.7 kg 117.5 kg    Examination:  Physical Exam Constitutional:      General: He is not in acute distress.    Appearance: He is not toxic-appearing.     Comments: Sedated  HENT:     Head: Normocephalic.     Mouth/Throat:     Comments: Intubated Eyes:     General: No scleral icterus.       Right eye: No discharge.        Left eye: No discharge.     Conjunctiva/sclera: Conjunctivae normal.     Pupils: Pupils are equal, round, and reactive to light.  Cardiovascular:     Rate and Rhythm: Normal rate and regular rhythm.     Heart sounds: Normal heart sounds. No murmur heard.   Pulmonary:     Effort: No respiratory distress.     Comments: Diminished breath sounds Abdominal:     General: Bowel sounds are normal. There is no distension.     Palpations: Abdomen is soft.  Musculoskeletal:     Comments: Trace edema lower extremity bilaterally  Skin:    General: Skin is warm.     Coloration: Skin is not jaundiced.  Neurological:     Comments:  minimally responsive     Resolved Hospital Problem list     Assessment & Plan:  Acute hypoxic respiratory failure requiring mechanical ventilation COVID-19 pneumonia Acute on chronic congestive heart failure MSSA bacteremia Diabetic foot wound Hypernatremia Hypokalemia Diabetes with hyperglycemia   Plan:  Acute hypoxic respiratory failure requiring mechanical ventilation COVID-19 pneumonia Underlying asthma Continue to wean off of ventilator as tolerated, currently on pressor support PEEP of 10/pressure 10.  Will come off sedation.  Hopefully can extubate  tomorrow -Continue mechanical ventilation per ARDS protocol -Target TVol 6-8cc/kgIBW -Target Plateau Pressure < 30cm H20 -Target driving pressure less than 15 cm of water -Target PaO2 55-65: titrate PEEP/FiO2 per protocol -As long as PaO2 to FiO2 ratio is less than 1:150 position in prone position for 16 hours a day Check CVP daily if CVL in place -Target CVP less than 4, diurese as necessary -Ventilator associated pneumonia prevention protocol -Sedation with Precedex, propofol and fentanyl.  Wean off as tolerated -Scheduled duo nebs -Continue prednisone and baricitinib per protocol   Acute on chronic systolic CHF Echo 8/19 shows EF of 30 to 35% with regional wall motion abnormalities.  He got good urine output yesterday.  Creatinine is normal.  Patient is euvolemic on exam.  Will start a daily Lasix 40 mg IV for gentle diuresis.  -Strict I&O -Daily weight -IV Lasix 40 mg daily   MSSA bacteremia Diabetic foot wound ID is on board.  Blood culture collected on 8/19 was negative.  Continue cefazolin for MSSA bacteremia.  Unclear if there is osteomyelitis involvement, will need MRI when he is more stable. -Continue cefazolin per ID   Hypernatremia  Sodium [phone].  Continue free water flushes to 300 cc every 4 hours -Monitor BMET and UOP -Replace electrolytes as needed -Free water 300 cc every 4 hours   DM2 with difficult to control hyperglycemia We will try to switch from insulin drip to basal insulin with NovoLog.  Continue tight blood sugar control with glucose less than 180. -Monitor CBG -Lantus 30 units twice daily -NovoLog 4 units every 4 hours -Sliding scale  Best practice:  Diet: tube feeding Pain/Anxiety/Delirium protocol (if indicated): Fentanyl, propofol and Precedex VAP protocol (if indicated): yes DVT prophylaxis: lovenox GI prophylaxis:  famotidine Glucose control: Lantus and NovoLog Mobility: bed rest Code Status: full Family Communication: update  family Disposition: remain in ICU for ventilator management  Critical care time: 25 minutes    Doran Stabler, DO Internal Medicine Residency My pager: [phone]   PCCM:   49 yo male, COVID 19 PNA, ARDS on mech vent   BP (!) 152/81   Pulse 66   Temp 97.8 F (36.6 C) (Axillary)   Resp 15   Ht 6\' 2"  (1.88 m)   Wt 117.5 kg   SpO2 91%   BMI 33.26 kg/m   Gen: male, intubated on life support  HENT: NCAT sclera clear Heart: rrr, s1 s2 Lung: BL vented breaths   Labs: reviewed  cxr reviewed   A:  AHRF  COVID19 PNA  ARDS  MSSA Bacteremia  Hyperglycemia, DMII  Acute systolic heart failure   P: LTVV  Tapering steroids  barcitinib  precedex  Scheduled diuresis to maintain euvolemia   This patient is critically ill with multiple organ system failure; which, requires  frequent high complexity decision making, assessment, support, evaluation, and titration of therapies. This was completed through the application of advanced monitoring technologies and extensive interpretation of multiple databases. During this encounter critical care time was devoted to patient care services described in this note for 33 minutes.  Josephine Igo, DO [facility] 10/17/2019 6:42 PM

## 2019-10-17 NOTE — Progress Notes (Signed)
Assisted tele visit to patient with provider.  Allyanna Appleman, Lang Snow, RN

## 2019-10-18 LAB — CBC
HCT: 37.5 % — ABNORMAL LOW (ref 39.0–52.0)
Hemoglobin: 11.8 g/dL — ABNORMAL LOW (ref 13.0–17.0)
MCH: 30.1 pg (ref 26.0–34.0)
MCHC: 31.5 g/dL (ref 30.0–36.0)
MCV: 95.7 fL (ref 80.0–100.0)
Platelets: 269 10*3/uL (ref 150–400)
RBC: 3.92 MIL/uL — ABNORMAL LOW (ref 4.22–5.81)
RDW: 12.8 % (ref 11.5–15.5)
WBC: 12.9 10*3/uL — ABNORMAL HIGH (ref 4.0–10.5)
nRBC: 0 % (ref 0.0–0.2)

## 2019-10-18 LAB — BASIC METABOLIC PANEL
Anion gap: 9 (ref 5–15)
BUN: 34 mg/dL — ABNORMAL HIGH (ref 6–20)
CO2: 34 mmol/L — ABNORMAL HIGH (ref 22–32)
Calcium: 8.6 mg/dL — ABNORMAL LOW (ref 8.9–10.3)
Chloride: 102 mmol/L (ref 98–111)
Creatinine, Ser: 0.74 mg/dL (ref 0.61–1.24)
GFR calc Af Amer: >60 mL/min (ref >60)
GFR calc non Af Amer: >60 mL/min (ref >60)
Glucose, Bld: 199 mg/dL — ABNORMAL HIGH (ref 70–99)
Potassium: 4 mmol/L (ref 3.5–5.1)
Sodium: 145 mmol/L (ref 135–145)

## 2019-10-18 LAB — GLUCOSE, CAPILLARY
Glucose-Capillary: 181 mg/dL — ABNORMAL HIGH (ref 70–99)
Glucose-Capillary: 183 mg/dL — ABNORMAL HIGH (ref 70–99)
Glucose-Capillary: 192 mg/dL — ABNORMAL HIGH (ref 70–99)
Glucose-Capillary: 227 mg/dL — ABNORMAL HIGH (ref 70–99)
Glucose-Capillary: 258 mg/dL — ABNORMAL HIGH (ref 70–99)
Glucose-Capillary: 266 mg/dL — ABNORMAL HIGH (ref 70–99)

## 2019-10-18 MED ORDER — DEXMEDETOMIDINE HCL IN NACL 400 MCG/100ML IV SOLN
0.4000 ug/kg/h | INTRAVENOUS | Status: AC
  Administered 2019-10-18 (×2): 1 ug/kg/h via INTRAVENOUS
  Administered 2019-10-18 – 2019-10-20 (×9): 0.8 ug/kg/h via INTRAVENOUS
  Administered 2019-10-20 – 2019-10-21 (×7): 1.2 ug/kg/h via INTRAVENOUS
  Filled 2019-10-18 (×7): qty 100
  Filled 2019-10-18: qty 400
  Filled 2019-10-18 (×11): qty 100

## 2019-10-18 MED ORDER — INSULIN GLARGINE 100 UNIT/ML ~~LOC~~ SOLN
33.0000 [IU] | Freq: Two times a day (BID) | SUBCUTANEOUS | Status: DC
  Administered 2019-10-18 – 2019-10-20 (×6): 33 [IU] via SUBCUTANEOUS
  Filled 2019-10-18 (×9): qty 0.33

## 2019-10-18 NOTE — Progress Notes (Addendum)
NAME:  Micheal Bell, MRN:  [id number], DOB:  1970/08/27, LOS: 6 ADMISSION DATE:  10/09/2019, CONSULTATION DATE:  8/19  REFERRING MD:  Leafy Half, CHIEF COMPLAINT:  COVID 71   Brief History   49 year old male admitted 8/16 for COVID 19 pneumonia. 8/19 early AM decompensated requiring up to 70 L HHFNC and NRB with O2 sats in the 70s and complaints of "wearing out" PCCM consulted.  Also treated for staph bacteremia and UTI. Partially vaccinated, first dose of vaccine was on 8/10. Developed cold symptoms on 8/11.  Past Medical History  Hypertension Gout DM2 CHF Asthma Irritable bowel syndrome fibromyalgia   Significant Hospital Events   8/16 present to [facility], COVID pos on 4L 8/19 transfer to [facility] on 70L HF and NRB, transferred to ICU due to respiratory fatigue, intubated, prone 8/20 prone, diuresed  Consults:    Procedures:  8/19 ETT >  8/19 R arm PICC >  8/23 self extubation and reintubation  Significant Diagnostic Tests:  8/19 TTE > LVEF 30-35%. LVEF appears to be depressed with  diffuse hypokinesis, worse in the mid/distal inferior/inferoseptal walls.   8/20 left foot X -ray healing fractures of left foot (4-5th metatarsal)  Micro Data:  8/16 SARS COV 2 > positive 8/16 Urine > MSSA 8/16 Blood > staph aureus 2/2, group B strep 2/2 8/19 wound> staph aureus  Antimicrobials/COVID Rx:  8/16 remdesivir >  8/19 Baricitinib >  8/19 solumedrol >   ========  8/17 ceftriaxone > 8/19 8/17 vanc > 8/19 8/19 cefazolin >  Interim history/subjective:  No acute event overnight  Patient is seen at bedside.  He is more responsive with physical and verbal stimulation.  Continue pressor support.  Objective   Blood pressure 125/80, pulse 70, temperature 98.6 F (37 C), temperature source Oral, resp. rate 20, height 6\' 2"  (1.88 m), weight 117.6 kg, SpO2 (!) 87 %. CVP:  [10 mmHg] 10 mmHg  Vent Mode: PRVC FiO2 (%):  [40 %-50 %] 40 % Set Rate:  [22 bmp] 22 bmp Vt Set:  [580  mL] 580 mL PEEP:  [10 cmH20] 10 cmH20 Pressure Support:  [8 cmH20] 8 cmH20 Plateau Pressure:  [23 cmH20-27 cmH20] 26 cmH20   Intake/Output Summary (Last 24 hours) at 10/18/2019 1320 Last data filed at 10/18/2019 1309 Gross per 24 hour  Intake 2809.93 ml  Output 6750 ml  Net -3940.07 ml   Filed Weights   10/16/19 0500 10/17/19 0500 10/18/19 0432  Weight: 117.7 kg 117.5 kg 117.6 kg    Examination:  Physical Exam Constitutional:      General: He is not in acute distress.    Appearance: He is not toxic-appearing.  HENT:     Head: Normocephalic.     Mouth/Throat:     Comments: Intubated Eyes:     General: No scleral icterus.    Conjunctiva/sclera: Conjunctivae normal.  Cardiovascular:     Rate and Rhythm: Normal rate and regular rhythm.     Heart sounds: Normal heart sounds. No murmur heard.   Pulmonary:     Effort: No respiratory distress.     Comments: Diminished breath sounds Abdominal:     General: Bowel sounds are normal. There is no distension.     Palpations: Abdomen is soft.  Musculoskeletal:     Comments: Trace edema Diabetic foot wound on the left lower extremity.  No purulent discharge  Skin:    General: Skin is warm.     Coloration: Skin is not jaundiced.  Neurological:     Comments: More responsive     Resolved Hospital Problem list     Assessment & Plan:  Acute hypoxic respiratory failure requiring mechanical ventilation COVID-19 pneumonia Acute on chronic congestive heart failure MSSA bacteremia Diabetic foot wound Hypernatremia Hypokalemia Diabetes with hyperglycemia   Plan:  Acute hypoxic respiratory failure requiring mechanical ventilation COVID-19 pneumonia Underlying asthma Continue pressure support.  Continue to wean off of propofol as sedation is a barrier for extubation. -Continue mechanical ventilation per ARDS protocol -Target TVol 6-8cc/kgIBW -Target Plateau Pressure < 30cm H20 -Target driving pressure less than 15 cm of  water -Ventilator associated pneumonia prevention protocol -Sedation with Precedex, propofol and fentanyl.  Wean off as tolerated.  Continue to try to come off of propofol. -Scheduled duo nebs -Continue prednisone and baricitinib per protocol   Acute on chronic systolic CHF Echo 8/19 shows EF of 30 to 35% with regional wall motion abnormalities.  Patient has good urine output in the last 24 hours -Strict I&O -Daily weight -IV Lasix 40 mg daily   MSSA bacteremia Diabetic foot wound ID is on board.  Blood culture collected on 8/19 was negative.  Continue cefazolin for MSSA bacteremia.  Unclear if there is osteomyelitis involvement, will need MRI when he is more stable. -Continue cefazolin per ID   Hypernatremia  Sodium [phone].  Continue free water flushes to 300 cc every 4 hours -Monitor BMET and UOP -Replace electrolytes as needed -Free water 300 cc every 4 hours   DM2 with difficult to control hyperglycemia  Continue tight blood sugar control with glucose less than 180. -Monitor CBG -Lantus 33 units twice daily -NovoLog 6 units every 4 hours -Sliding scale  Best practice:  Diet: tube feeding Pain/Anxiety/Delirium protocol (if indicated): Fentanyl, propofol and Precedex VAP protocol (if indicated): yes DVT prophylaxis: lovenox GI prophylaxis:  famotidine Glucose control: Lantus and NovoLog Mobility: bed rest Code Status: full Family Communication: update family Disposition: remain in ICU for ventilator management  Critical care time: 25 minutes    Doran Stabler, DO Internal Medicine Residency My pager: [phone]   Pulmonary critical care attending:  This is a 49 year old gentleman who COVID-19 pneumonia, ARDS, on mechanical support. Slowly has been improving from a mechanical ventilator mechanics standpoint.  Mental status has been difficult as well as sedation.  BP 119/68 (BP Location: Left Arm)   Pulse 71   Temp 98.3 F (36.8 C) (Oral)   Resp  16   Ht 6\' 2"  (1.88 m)   Wt 117.6 kg   SpO2 92%   BMI 33.29 kg/m   General: Male, intubated on life support. HEENT: NCAT, sclera clear Heart: Regular rate rhythm, S1-S2 Lungs: Bilateral mechanically ventilated breath sounds  Labs: reviewed  CXR: bilateral infiltrates   A:  Acute hypoxemic respiratory failure COVID-19 pneumonia ARDS MSSA bacteremia Hyperglycemia with type 2 diabetes Acute on chronic systolic heart failure  Plan: Continue diuresis Tolerating pressure support trials today. Mental status seems to be the biggest problem with liberation from the ventilator. Very difficult following commands.  Easily agitated. He is doing better.  But does still need some time off of Versed. Continue fentanyl and Precedex for sedation needs.  This patient is critically ill with multiple organ system failure; which, requires frequent high complexity decision making, assessment, support, evaluation, and titration of therapies. This was completed through the application of advanced monitoring technologies and extensive interpretation of multiple databases. During this encounter critical care time was devoted to patient care services described in  this note for 32 minutes.  Josephine Igo, DO [facility] 10/18/2019 6:50 PM

## 2019-10-18 NOTE — Progress Notes (Signed)
Assisted tele visit to patient with family member.  Micheal Dai McEachran, RN  

## 2019-10-18 NOTE — Progress Notes (Signed)
Updated pts mother and sister regarding pt current status.  All questions answered.  [name], RN

## 2019-10-18 NOTE — Procedures (Signed)
Cortrak  Person Inserting Tube:  Lashawn Bromwell, RD Tube Type:  Cortrak - 43 inches Tube Location:  Left nare Initial Placement:  Stomach Secured by: Bridle Technique Used to Measure Tube Placement:  Documented cm marking at nare/ corner of mouth Cortrak Secured At:  70 cm   Cortrak Tube Team Note:  Consult received to place a Cortrak feeding tube.   No x-ray is required. RN may begin using tube.   If the tube becomes dislodged please keep the tube and contact the Cortrak team at [email or web address] (password [email or web address]) for replacement.  If after hours and replacement cannot be delayed, place a NG tube and confirm placement with an abdominal x-ray.    Vanessa Kick RD, LDN Clinical Nutrition Pager listed in AMION

## 2019-10-19 ENCOUNTER — Ambulatory Visit: Payer: Medicaid Other | Admitting: Podiatry

## 2019-10-19 ENCOUNTER — Inpatient Hospital Stay (HOSPITAL_COMMUNITY): Payer: Medicaid Other

## 2019-10-19 DIAGNOSIS — U071 COVID-19: Secondary | ICD-10-CM

## 2019-10-19 DIAGNOSIS — M79606 Pain in leg, unspecified: Secondary | ICD-10-CM

## 2019-10-19 LAB — POCT I-STAT 7, (LYTES, BLD GAS, ICA,H+H)
Acid-Base Excess: 13 mmol/L — ABNORMAL HIGH (ref 0.0–2.0)
Bicarbonate: 36.2 mmol/L — ABNORMAL HIGH (ref 20.0–28.0)
Calcium, Ion: 1.16 mmol/L (ref 1.15–1.40)
HCT: 37 % — ABNORMAL LOW (ref 39.0–52.0)
Hemoglobin: 12.6 g/dL — ABNORMAL LOW (ref 13.0–17.0)
O2 Saturation: 90 %
Patient temperature: 100.9
Potassium: 3.9 mmol/L (ref 3.5–5.1)
Sodium: 141 mmol/L (ref 135–145)
TCO2: 37 mmol/L — ABNORMAL HIGH (ref 22–32)
pCO2 arterial: 41.1 mmHg (ref 32.0–48.0)
pH, Arterial: 7.557 — ABNORMAL HIGH (ref 7.350–7.450)
pO2, Arterial: 55 mmHg — ABNORMAL LOW (ref 83.0–108.0)

## 2019-10-19 LAB — BASIC METABOLIC PANEL
Anion gap: 9 (ref 5–15)
BUN: 28 mg/dL — ABNORMAL HIGH (ref 6–20)
CO2: 32 mmol/L (ref 22–32)
Calcium: 8.1 mg/dL — ABNORMAL LOW (ref 8.9–10.3)
Chloride: 98 mmol/L (ref 98–111)
Creatinine, Ser: 0.72 mg/dL (ref 0.61–1.24)
GFR calc Af Amer: >60 mL/min (ref >60)
GFR calc non Af Amer: >60 mL/min (ref >60)
Glucose, Bld: 250 mg/dL — ABNORMAL HIGH (ref 70–99)
Potassium: 4 mmol/L (ref 3.5–5.1)
Sodium: 139 mmol/L (ref 135–145)

## 2019-10-19 LAB — GLUCOSE, CAPILLARY
Glucose-Capillary: 275 mg/dL — ABNORMAL HIGH (ref 70–99)
Glucose-Capillary: 211 mg/dL — ABNORMAL HIGH (ref 70–99)
Glucose-Capillary: 143 mg/dL — ABNORMAL HIGH (ref 70–99)
Glucose-Capillary: 95 mg/dL (ref 70–99)
Glucose-Capillary: 188 mg/dL — ABNORMAL HIGH (ref 70–99)
Glucose-Capillary: 295 mg/dL — ABNORMAL HIGH (ref 70–99)

## 2019-10-19 LAB — CBC
HCT: 36.9 % — ABNORMAL LOW (ref 39.0–52.0)
Hemoglobin: 11.8 g/dL — ABNORMAL LOW (ref 13.0–17.0)
MCH: 30.5 pg (ref 26.0–34.0)
MCHC: 32 g/dL (ref 30.0–36.0)
MCV: 95.3 fL (ref 80.0–100.0)
Platelets: 299 10*3/uL (ref 150–400)
RBC: 3.87 MIL/uL — ABNORMAL LOW (ref 4.22–5.81)
RDW: 12.4 % (ref 11.5–15.5)
WBC: 10.8 10*3/uL — ABNORMAL HIGH (ref 4.0–10.5)
nRBC: 0 % (ref 0.0–0.2)

## 2019-10-19 LAB — TRIGLYCERIDES: Triglycerides: 162 mg/dL — ABNORMAL HIGH (ref <150)

## 2019-10-19 IMAGING — CT CT HEAD W/O CM
3 series · 14 of 47 positions shown, 16 images · non-contrast
Comparison: None.

CLINICAL DATA: Deliriums.  Mental status change.

EXAM:
CT HEAD WITHOUT CONTRAST
TECHNIQUE: Contiguous axial images were obtained from the base of the skull
through the vertex without intravenous contrast.

[Series 3: head 5.0 h30s · axial · 0.47mm/px · z∈[-98,+47]mm · 8 of 35 slices shown, 10 images]
[im 3/35  brain]
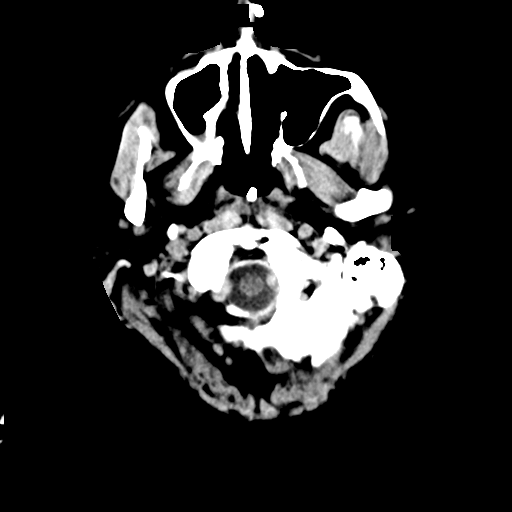
[im 3/35  bone]
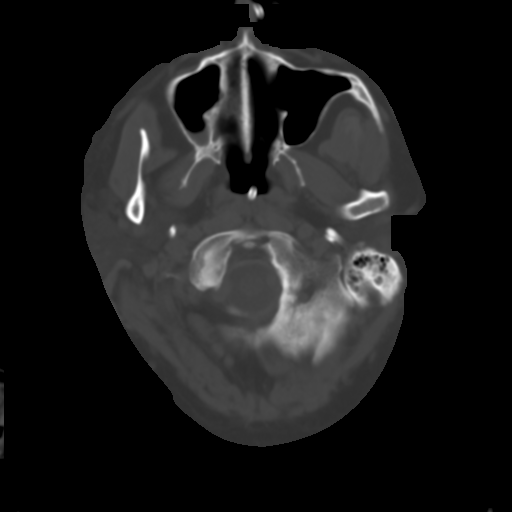
[im 8/35  brain]
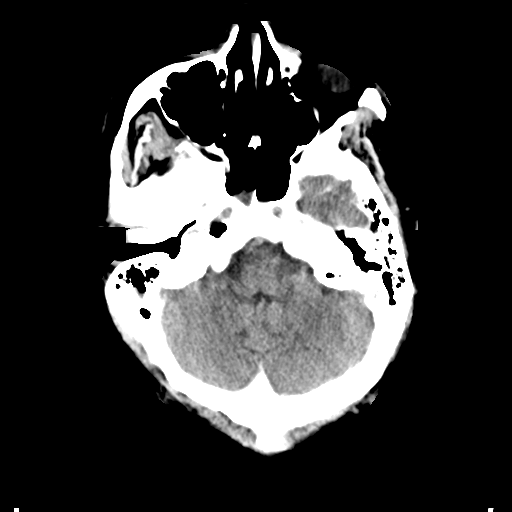
[im 11/35  brain]
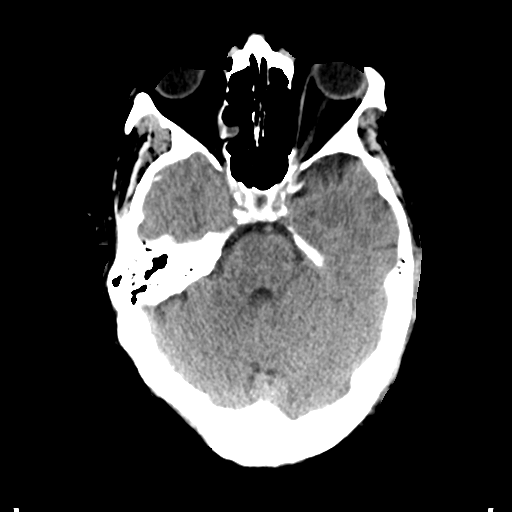
[im 16/35  brain]
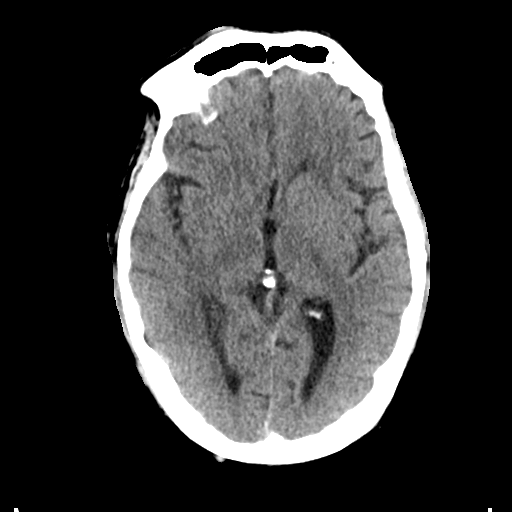
[im 19/35  brain]
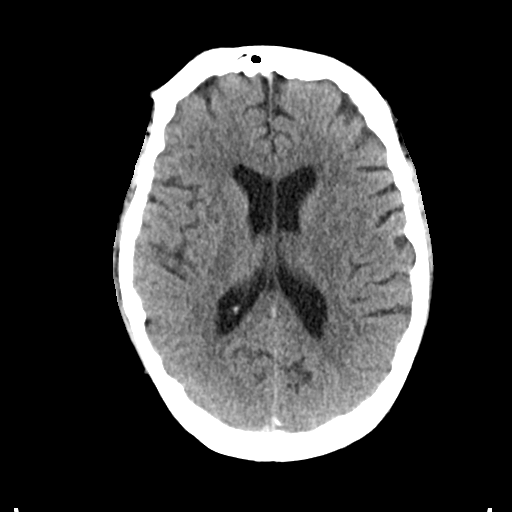
[im 19/35  bone]
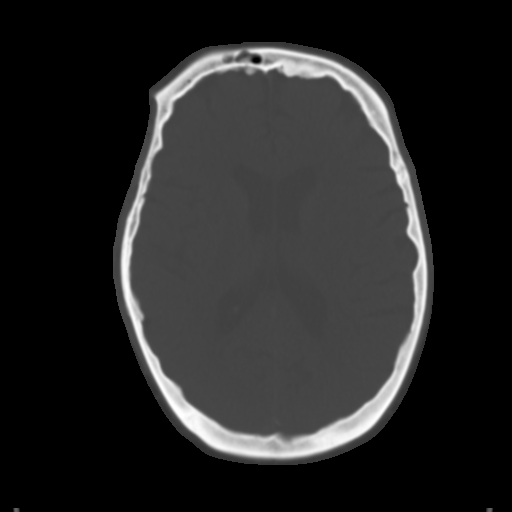
[im 24/35  brain]
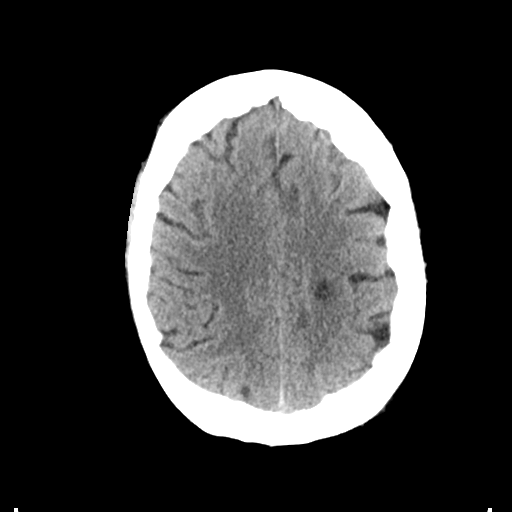
[im 27/35  brain]
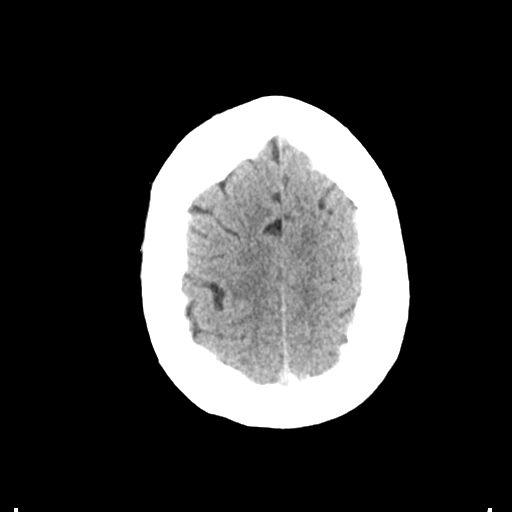
[im 32/35  brain]
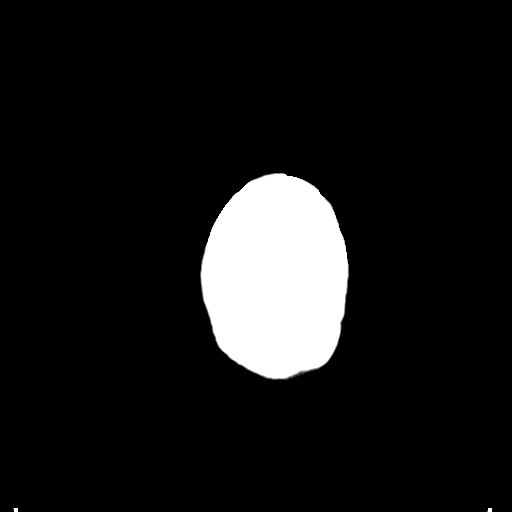

[Series 5: head 3.0 mpr cor · coronal · 0.33mm/px · 3 of 80 slices shown]
[im 27/80  brain]
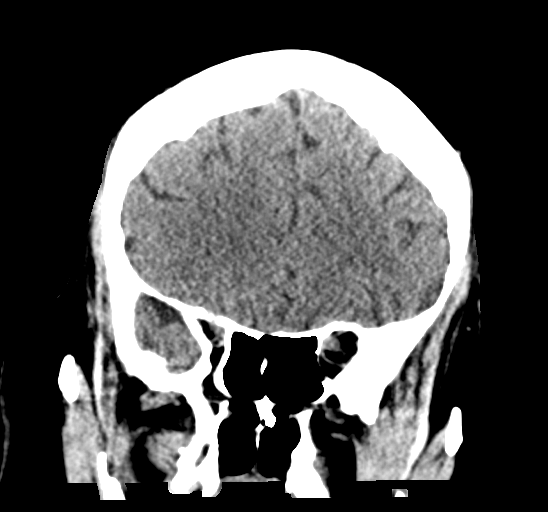
[im 36/80  brain]
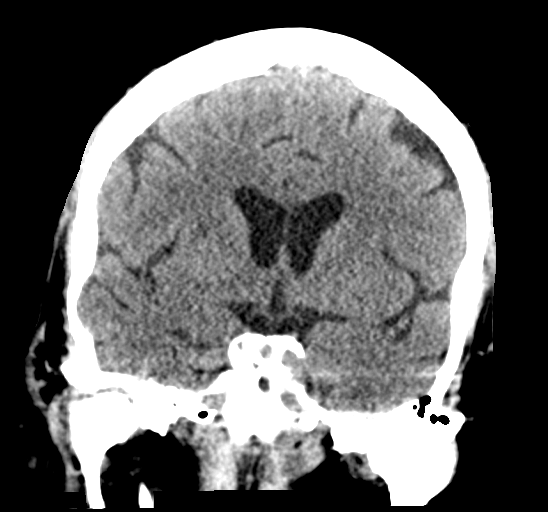
[im 44/80  brain]
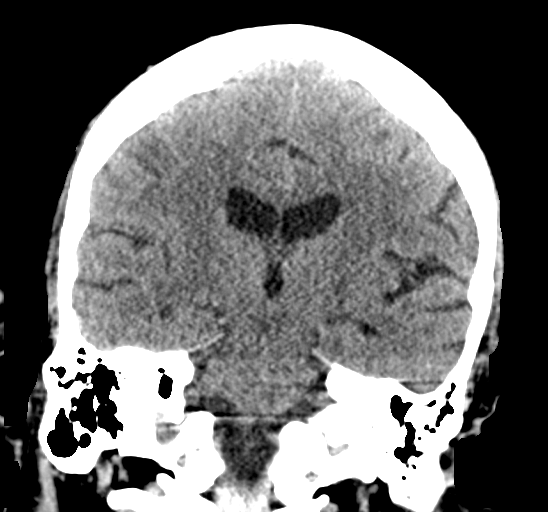

[Series 6: head 3.0 mpr sag · sagittal · 0.33mm/px · 3 of 62 slices shown]
[im 21/62  brain]
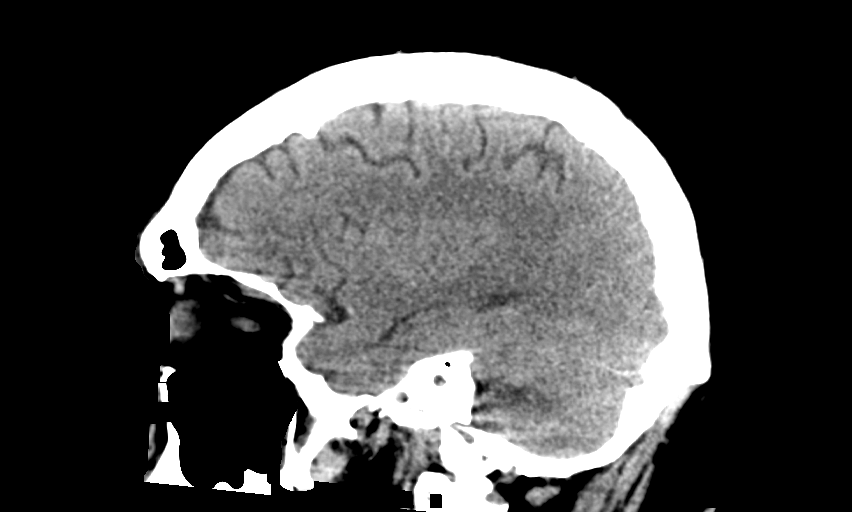
[im 31/62  brain]
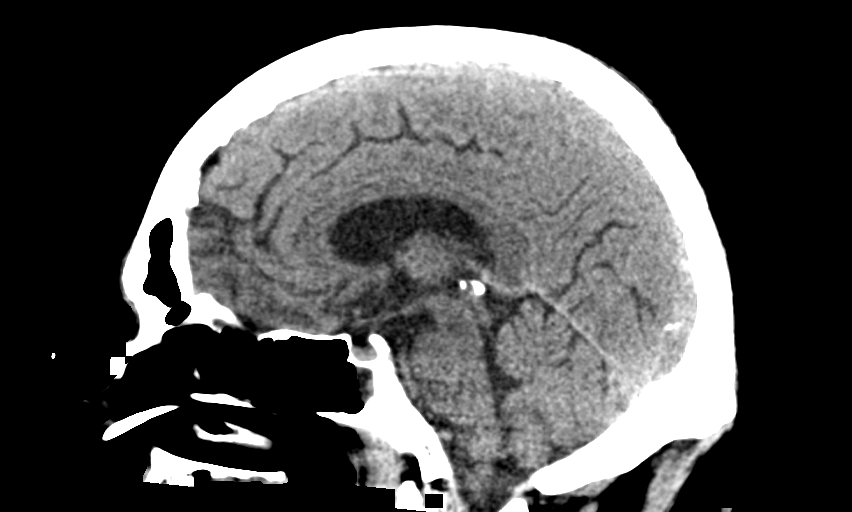
[im 41/62  brain]
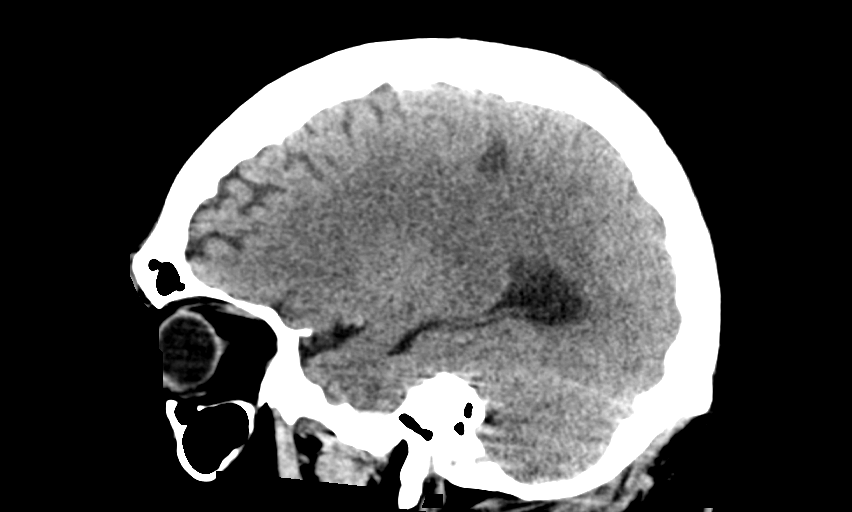

[14 of 47 positions shown; findings below may reference images not displayed]

FINDINGS: Brain: Area subcortical low-density involving the posterior left
frontal lobe at the convexity, series 3, image 25 and series 5,
image 49, likely encephalomalacia and sequela of remote infarct,
however nonspecific. No hemorrhage. No evidence of acute ischemia.
No hydrocephalus, midline shift or mass effect. No subdural or
extra-axial collection.

Vascular: Atherosclerosis of skullbase vasculature without
hyperdense vessel or abnormal calcification.

Skull: No fracture or focal lesion.

Sinuses/Orbits: Nasogastric tube in place. Fluid levels in the
sphenoid sinuses and right maxillary sinus with scattered
opacification of ethmoid air cells may be related to intubation.
Opacification of the right greater than left mastoid air cells.

Other: None.
IMPRESSION: 1. No acute intracranial abnormality.
2. Area of subcortical low-density involving the posterior left
frontal lobe at the convexity, likely encephalomalacia and sequela
of remote infarct. There are no prior exams for comparison to
establish chronicity. MRI could be considered for further evaluation
based on clinical concern.
3. Paranasal sinus fluid levels on opacification of the right
greater than left mastoid air cells, possibly related to intubation.

## 2019-10-19 MED ORDER — PREDNISONE 5 MG PO TABS
25.0000 mg | ORAL_TABLET | Freq: Every day | ORAL | Status: AC
  Administered 2019-10-22 – 2019-10-24 (×3): 25 mg
  Filled 2019-10-19 (×3): qty 1

## 2019-10-19 MED ORDER — INSULIN ASPART 100 UNIT/ML ~~LOC~~ SOLN
8.0000 [IU] | SUBCUTANEOUS | Status: DC
  Administered 2019-10-19 – 2019-10-23 (×25): 8 [IU] via SUBCUTANEOUS

## 2019-10-19 MED ORDER — PREDNISONE 10 MG PO TABS
10.0000 mg | ORAL_TABLET | Freq: Every day | ORAL | Status: DC
  Administered 2019-10-25: 10 mg
  Filled 2019-10-19 (×2): qty 1

## 2019-10-19 MED ORDER — PROSOURCE TF PO LIQD
45.0000 mL | Freq: Two times a day (BID) | ORAL | Status: DC
  Administered 2019-10-19 – 2019-10-25 (×10): 45 mL
  Filled 2019-10-19 (×11): qty 45

## 2019-10-19 MED ORDER — VITAL 1.5 CAL PO LIQD
1000.0000 mL | ORAL | Status: DC
  Administered 2019-10-19 – 2019-10-23 (×4): 1000 mL
  Filled 2019-10-19 (×12): qty 1000

## 2019-10-19 NOTE — Progress Notes (Signed)
Spoke with patient's sister and mother and answered all questions/provided full update.

## 2019-10-19 NOTE — Progress Notes (Signed)
VASCULAR LAB    Right lower extremity venous duplex completed.    Preliminary report:  See CV proc for preliminary results.  Cierra Rothgeb, RVT 10/19/2019, 12:24 PM

## 2019-10-19 NOTE — Progress Notes (Signed)
NAME:  Micheal Bell, MRN:  [id number], DOB:  April 10, 1970, LOS: 7 ADMISSION DATE:  10/09/2019, CONSULTATION DATE:  8/19  REFERRING MD:  Leafy Half, CHIEF COMPLAINT:  COVID 13   Brief History   49 year old male admitted 8/16 for COVID 19 pneumonia. 8/19 early AM decompensated requiring up to 70 L HHFNC and NRB with O2 sats in the 70s and complaints of "wearing out" PCCM consulted.  Also treated for staph bacteremia and UTI. Partially vaccinated, first dose of vaccine was on 8/10. Developed cold symptoms on 8/11.  Past Medical History  Hypertension Gout DM2 CHF Asthma Irritable bowel syndrome fibromyalgia   Significant Hospital Events   8/16 present to [facility], COVID pos on 4L 8/19 transfer to [facility] on 70L HF and NRB, transferred to ICU due to respiratory fatigue, intubated, prone 8/20 prone, diuresed  Consults:    Procedures:  8/19 ETT >  8/19 R arm PICC >  8/23 self extubation and reintubation  Significant Diagnostic Tests:  8/19 TTE > LVEF 30-35%. LVEF appears to be depressed with  diffuse hypokinesis, worse in the mid/distal inferior/inferoseptal walls.   8/20 left foot X -ray healing fractures of left foot (4-5th metatarsal)  Micro Data:  8/16 SARS COV 2 > positive 8/16 Urine > MSSA 8/16 Blood > staph aureus 2/2, group B strep 2/2 8/19 wound> staph aureus  Antimicrobials/COVID Rx:  8/16 remdesivir >  8/19 Baricitinib >  8/19 solumedrol >   ========  8/17 ceftriaxone > 8/19 8/17 vanc > 8/19 8/19 cefazolin >  Interim history/subjective:   Still not following commands.  Not moving right lower extremity also right lower extremity much cooler on exam.  Low-grade fevers.  Objective   Blood pressure 109/64, pulse 88, temperature (!) 100.4 F (38 C), temperature source Axillary, resp. rate (!) 27, height 6\' 2"  (1.88 m), weight 117.6 kg, SpO2 (!) 89 %.    Vent Mode: PRVC FiO2 (%):  [40 %] 40 % Set Rate:  [22 bmp] 22 bmp Vt Set:  [580 mL] 580 mL PEEP:  [8  cmH20-10 cmH20] 8 cmH20 Pressure Support:  [10 cmH20] 10 cmH20 Plateau Pressure:  [24 cmH20-26 cmH20] 26 cmH20   Intake/Output Summary (Last 24 hours) at 10/19/2019 0917 Last data filed at 10/19/2019 10/21/2019 Gross per 24 hour  Intake 3651.71 ml  Output 5000 ml  Net -1348.29 ml   Filed Weights   10/17/19 0500 10/18/19 0432 10/19/19 0500  Weight: 117.5 kg 117.6 kg 117.6 kg    Examination: General appearance: Debilitated male intubated on mechanical life support Eyes: Not tracking HENT: NCAT; endotracheal tube in place Neck: Trachea midline;  Lungs: Bilateral mechanical ventilation CV: Regular rate rhythm, S1-S2 Abdomen: Soft nontender nondistended Extremities: No significant edema, right lower extremity cooler than left Psych: Appropriate affect Neuro: Moves left side spontaneously right arm held move but I cannot get him to move his right lower extremity  Resolved Hospital Problem list     Assessment & Plan:  Acute hypoxic respiratory failure requiring mechanical ventilation COVID-19 pneumonia Acute on chronic congestive heart failure MSSA bacteremia Diabetic foot wound Hypernatremia Hypokalemia Diabetes with hyperglycemia   Plan:  Acute hypoxic respiratory failure requiring mechanical ventilation COVID-19 pneumonia Underlying asthma Continue mechanical ventilation per ARDS protocol Target TVol 6-8cc/kgIBW Target Plateau Pressure < 30cm H20 Target driving pressure less than 15 cm of water Target PaO2 55-65: titrate PEEP/FiO2 per protocol As long as PaO2 to FiO2 ratio is less than 1:150 position in prone position for 16  hours a day Check CVP daily if CVL in place Target CVP less than 4, diurese as necessary Ventilator associated pneumonia prevention protocol  Cool right lower extremity Also no movement with stimulus on the right lower extremity Plan: Head CT Arterial duplex lower extremity.  Acute on chronic systolic CHF Echo 8/19 shows EF of 30 to 35% with  regional wall motion abnormalities.  Patient has good urine output in the last 24 hours -Follow I's and O's, daily weight, IV Lasix daily maintain euvolemia  MSSA bacteremia Diabetic foot wound ID is on board.  Blood culture collected on 8/19 was negative.  Continue cefazolin for MSSA bacteremia.  Unclear if there is osteomyelitis involvement, will need MRI when he is more stable. -Continue cefazolin  Hypernatremia  Sodium [phone].  Continue free water flushes to 300 cc every 4 hours -Follow urine output and basic metabolic panel -Continue free water  DM2 with difficult to control hyperglycemia  Continue tight blood sugar control with glucose less than 180. -Continue to follow CBGs -Lantus plus NovoLog/SSI  Best practice:  Diet: tube feeding Pain/Anxiety/Delirium protocol (if indicated): Fentanyl, propofol and Precedex VAP protocol (if indicated): yes DVT prophylaxis: lovenox GI prophylaxis:  famotidine Glucose control: Lantus and NovoLog Mobility: bed rest Code Status: full Family Communication: update family Disposition: ICU for ventilator management  This patient is critically ill with multiple organ system failure; which, requires frequent high complexity decision making, assessment, support, evaluation, and titration of therapies. This was completed through the application of advanced monitoring technologies and extensive interpretation of multiple databases. During this encounter critical care time was devoted to patient care services described in this note for 33 minutes.  Josephine Igo, DO [facility] 10/19/2019 9:22 AM

## 2019-10-19 NOTE — Progress Notes (Signed)
eLink Physician-Brief Progress Note Patient Name: Micheal Bell DOB: 06/07/1970 MRN: [id number]   Date of Service  10/19/2019  HPI/Events of Note  Agitation - Request to renew bilateral soft wrist restraints.   eICU Interventions  Will renew bilateral soft wrist restraints X 10 hours.     Intervention Category Major Interventions: Delirium, psychosis, severe agitation - evaluation and management  [name] 10/19/2019, 10:16 PM

## 2019-10-19 NOTE — Progress Notes (Signed)
Nutrition Follow-up  DOCUMENTATION CODES:   Not applicable  INTERVENTION:   Tube Feeding via Cortrak:  Vital 1.5 at 70 ml/hr Pro-Source TF 45 mL BID Provide 135 g of protein, 2600 kcals and 1277 mL of free water Meets 100% estimated calorie and protein needs   NUTRITION DIAGNOSIS:   Inadequate oral intake related to acute illness as evidenced by NPO status.  Being addressed via TF   GOAL:   Patient will meet greater than or equal to 90% of their needs  Progressing  MONITOR:   Weight trends, Vent status, Labs, TF tolerance, Skin  REASON FOR ASSESSMENT:   Ventilator    ASSESSMENT:   49 yo male admitted with COVID 19 pneumonia requiring intubation. PMH includes HTN, gout, DM, CHF, IBS   8/16 Admitted to [facility] 8/19 Transferred to [facility], Intubated 8/25 Cortrak placed, gastric  Pt remains on vent support, sedated on fentanyl, propofol and precedex. Not currently prone  Propofol: 17.7 ml/hr  Vital 1.5 at 45 ml/hr via cortrak , Free water flush 300 mL q 4 hours  Current weight 117.6 kg. Net negative 6L  Labs: CBGs 181-275, sodium 141 (wdl), Creatinine wdl Meds: ss novolog, novolog q 4 hours, lantus, lasix, prednisone   Diet Order:   Diet Order            Diet NPO time specified  Diet effective now                 EDUCATION NEEDS:   Not appropriate for education at this time  Skin:  Skin Assessment: Skin Integrity Issues: Skin Integrity Issues:: Diabetic Ulcer Diabetic Ulcer: L. Foot after stepping on nail 2-3 weeks ago Other: wound to L. Foot after stepping on nail 2-3 weeks ago  Last BM:  8/22  Height:   Ht Readings from Last 1 Encounters:  10/10/19 6\' 2"  (1.88 m)    Weight:   Wt Readings from Last 1 Encounters:  10/19/19 117.6 kg    BMI:  Body mass index is 33.29 kg/m.  Estimated Nutritional Needs:   Kcal:  2600-2800 kcals  Protein:  130-150 g  Fluid:  >/= 2 L   10/21/19 MS, RDN, LDN, CNSC Registered Dietitian  III Clinical Nutrition RD Pager and On-Call Pager Number Located in [place]

## 2019-10-20 ENCOUNTER — Inpatient Hospital Stay (HOSPITAL_COMMUNITY): Payer: Medicaid Other

## 2019-10-20 LAB — POCT I-STAT 7, (LYTES, BLD GAS, ICA,H+H)
Acid-Base Excess: 12 mmol/L — ABNORMAL HIGH (ref 0.0–2.0)
Bicarbonate: 37.8 mmol/L — ABNORMAL HIGH (ref 20.0–28.0)
Calcium, Ion: 1.2 mmol/L (ref 1.15–1.40)
HCT: 36 % — ABNORMAL LOW (ref 39.0–52.0)
Hemoglobin: 12.2 g/dL — ABNORMAL LOW (ref 13.0–17.0)
O2 Saturation: 94 %
Patient temperature: 98.8
Potassium: 4.2 mmol/L (ref 3.5–5.1)
Sodium: 141 mmol/L (ref 135–145)
TCO2: 40 mmol/L — ABNORMAL HIGH (ref 22–32)
pCO2 arterial: 56.1 mmHg — ABNORMAL HIGH (ref 32.0–48.0)
pH, Arterial: 7.437 (ref 7.350–7.450)
pO2, Arterial: 70 mmHg — ABNORMAL LOW (ref 83.0–108.0)

## 2019-10-20 LAB — CBC
HCT: 38.1 % — ABNORMAL LOW (ref 39.0–52.0)
Hemoglobin: 11.9 g/dL — ABNORMAL LOW (ref 13.0–17.0)
MCH: 30.8 pg (ref 26.0–34.0)
MCHC: 31.2 g/dL (ref 30.0–36.0)
MCV: 98.7 fL (ref 80.0–100.0)
Platelets: 302 10*3/uL (ref 150–400)
RBC: 3.86 MIL/uL — ABNORMAL LOW (ref 4.22–5.81)
RDW: 12.2 % (ref 11.5–15.5)
WBC: 11 10*3/uL — ABNORMAL HIGH (ref 4.0–10.5)
nRBC: 0 % (ref 0.0–0.2)

## 2019-10-20 LAB — BASIC METABOLIC PANEL
Anion gap: 10 (ref 5–15)
BUN: 28 mg/dL — ABNORMAL HIGH (ref 6–20)
CO2: 30 mmol/L (ref 22–32)
Calcium: 8.1 mg/dL — ABNORMAL LOW (ref 8.9–10.3)
Chloride: 100 mmol/L (ref 98–111)
Creatinine, Ser: 0.61 mg/dL (ref 0.61–1.24)
GFR calc Af Amer: >60 mL/min (ref >60)
GFR calc non Af Amer: >60 mL/min (ref >60)
Glucose, Bld: 247 mg/dL — ABNORMAL HIGH (ref 70–99)
Potassium: 4.2 mmol/L (ref 3.5–5.1)
Sodium: 140 mmol/L (ref 135–145)

## 2019-10-20 LAB — GLUCOSE, CAPILLARY
Glucose-Capillary: 258 mg/dL — ABNORMAL HIGH (ref 70–99)
Glucose-Capillary: 220 mg/dL — ABNORMAL HIGH (ref 70–99)
Glucose-Capillary: 84 mg/dL (ref 70–99)
Glucose-Capillary: 233 mg/dL — ABNORMAL HIGH (ref 70–99)
Glucose-Capillary: 248 mg/dL — ABNORMAL HIGH (ref 70–99)
Glucose-Capillary: 181 mg/dL — ABNORMAL HIGH (ref 70–99)

## 2019-10-20 IMAGING — DX DG ABD PORTABLE 1V
1 series · 2 of 2 positions shown · non-contrast
Comparison: [DATE]

CLINICAL DATA: GI problem.

EXAM:
PORTABLE ABDOMEN - 1 VIEW

[Series 1: abdomen · 0.14mm/px · 2 of 2 slices shown]
[im 1/2]
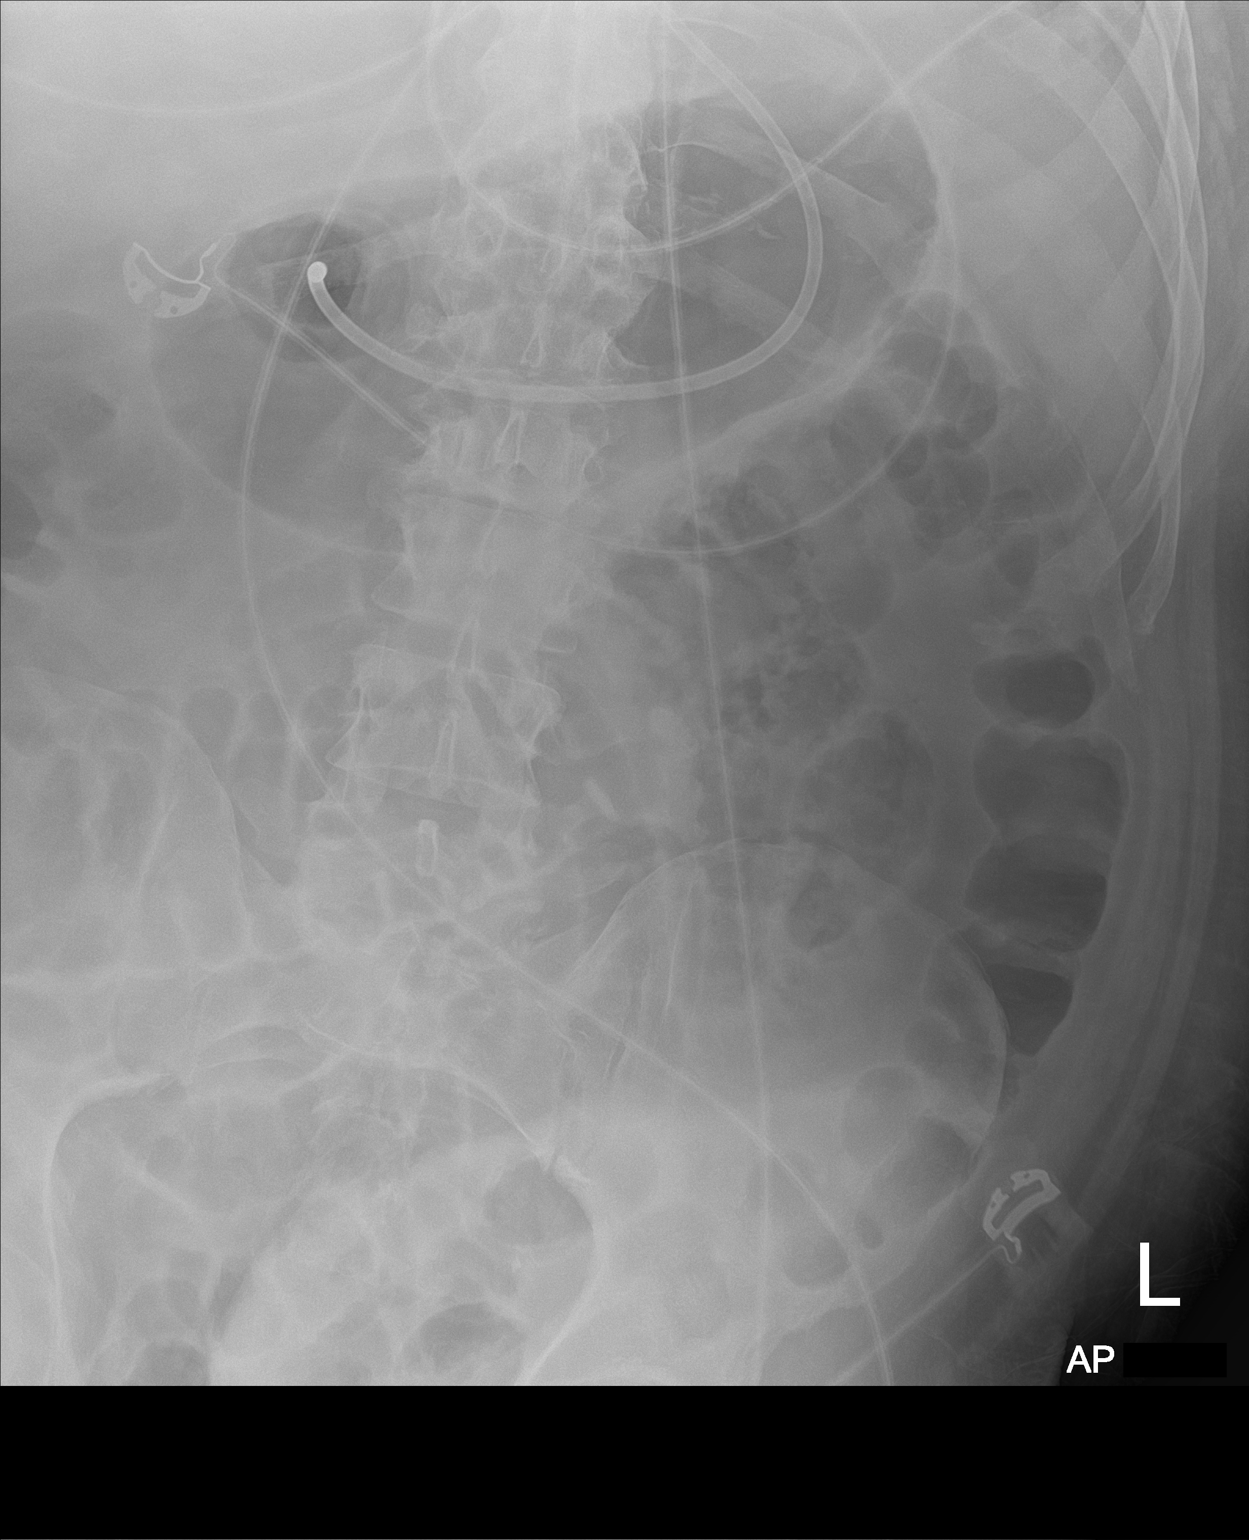
[im 2/2]
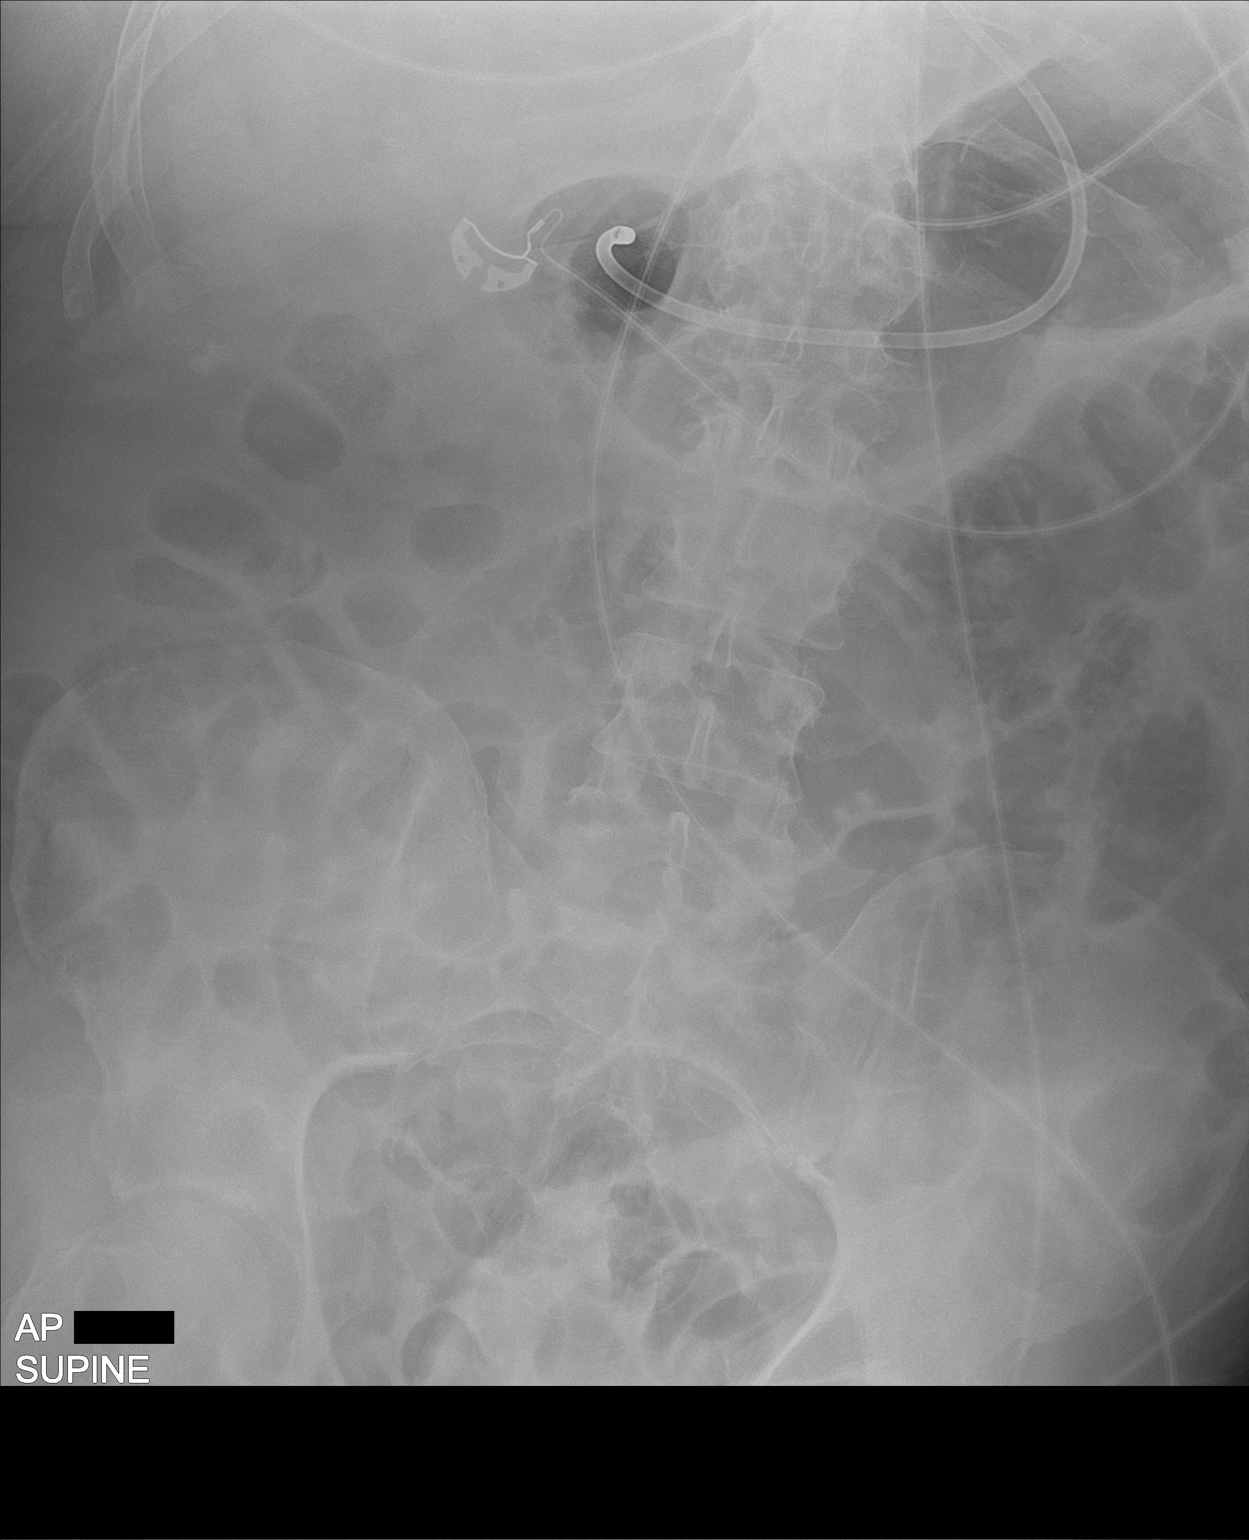

[2 of 2 positions shown; findings below may reference images not displayed]

FINDINGS: Feeding tube tip is in the distal stomach or proximal duodenum. Mild
gaseous distention of the stomach. Gas throughout nondistended large
and small bowel. No evidence of bowel obstruction, organomegaly or
free air.
IMPRESSION: Feeding tube tip in the distal stomach or proximal duodenum.

No evidence of obstruction or free air.

## 2019-10-20 NOTE — Progress Notes (Signed)
NAME:  Micheal Bell, MRN:  [id number], DOB:  Sep 09, 1970, LOS: 8 ADMISSION DATE:  10/09/2019, CONSULTATION DATE:  8/19  REFERRING MD:  Leafy Half, CHIEF COMPLAINT:  COVID 38   Brief History   49 year old male admitted 8/16 for COVID 19 pneumonia. 8/19 early AM decompensated requiring up to 70 L HHFNC and NRB with O2 sats in the 70s and complaints of "wearing out" PCCM consulted.  Also treated for staph bacteremia and UTI. Partially vaccinated, first dose of vaccine was on 8/10. Developed cold symptoms on 8/11.  Past Medical History  Hypertension Gout DM2 CHF Asthma Irritable bowel syndrome fibromyalgia   Significant Hospital Events   8/16 present to [facility], COVID pos on 4L 8/19 transfer to [facility] on 70L HF and NRB, transferred to ICU due to respiratory fatigue, intubated, prone 8/20 prone, diuresed  Consults:    Procedures:  8/19 ETT >  8/19 R arm PICC >  8/23 self extubation and reintubation  Significant Diagnostic Tests:  8/19 TTE > LVEF 30-35%. LVEF appears to be depressed with  diffuse hypokinesis, worse in the mid/distal inferior/inferoseptal walls.   8/20 left foot X -ray healing fractures of left foot (4-5th metatarsal)  Micro Data:  8/16 SARS COV 2 > positive 8/16 Urine > MSSA 8/16 Blood > staph aureus 2/2, group B strep 2/2 8/19 wound> staph aureus  Antimicrobials/COVID Rx:  8/16 remdesivir >  8/19 Baricitinib >  8/19 solumedrol >   ========  8/17 ceftriaxone > 8/19 8/17 vanc > 8/19 8/19 cefazolin >  Interim history/subjective:   Mental status remains and issue. Fully encephalopathic. Vent mechanics are better   Objective   Blood pressure 109/67, pulse 76, temperature 99.8 F (37.7 C), temperature source Axillary, resp. rate 17, height 6\' 2"  (1.88 m), weight 121.2 kg, SpO2 94 %.    Vent Mode: CPAP;PSV FiO2 (%):  [40 %] 40 % PEEP:  [8 cmH20] 8 cmH20 Pressure Support:  [8 cmH20] 8 cmH20 Plateau Pressure:  [19 cmH20] 19 cmH20   Intake/Output  Summary (Last 24 hours) at 10/20/2019 1713 Last data filed at 10/20/2019 1700 Gross per 24 hour  Intake 2590.64 ml  Output 3235 ml  Net -644.36 ml   Filed Weights   10/18/19 0432 10/19/19 0500 10/20/19 0402  Weight: 117.6 kg 117.6 kg 121.2 kg    Examination: General appearance: male intubated on life support  Eyes: tracking  HENT: ncat, ett in place  Neck: trachea midline  Lungs: BL mechanically ventilated breath sounds  CV: RRR s1 s2  Abdomen: soft, nt nd  Extremities: right LE still cooler  Neuro: still no moving right lower ext   Resolved Hospital Problem list     Assessment & Plan:  Acute hypoxic respiratory failure requiring mechanical ventilation COVID-19 pneumonia Acute on chronic congestive heart failure MSSA bacteremia Diabetic foot wound Hypernatremia Hypokalemia Diabetes with hyperglycemia   Plan:  Acute hypoxic respiratory failure requiring mechanical ventilation COVID-19 pneumonia Underlying asthma Continue mechanical ventilation per ARDS protocol Ventilator associated pneumonia prevention protocol 8/27: tolerating PS trials today  ?extubate tomorrow if his mental status is any better  Trying to avoid any bdz  Increased precedex Decreased fent ggt   ?CVA  Cool right lower extremity Also no movement with stimulus on the right lower extremity Plan: Head CT - was negative Arterial duplex lower extremity - no clot, patent  May need an MRI at some point   Acute on chronic systolic CHF Echo 8/19 shows EF of 30 to  35% with regional wall motion abnormalities.  Patient has good urine output in the last 24 hours - continue diuresis to maintain euvolemia   MSSA bacteremia Diabetic foot wound ID is on board.  Blood culture collected on 8/19 was negative.  Continue cefazolin for MSSA bacteremia.  Unclear if there is osteomyelitis involvement, will need MRI when he is more stable. -continue cefazolin   Hypernatremia  - follow bmp - continue free water  flushes   DM2 with difficult to control hyperglycemia cbg goal <180 - glycemic control protocol   Best practice:  Diet: tube feeding Pain/Anxiety/Delirium protocol (if indicated): Fentanyl, propofol and Precedex VAP protocol (if indicated): yes DVT prophylaxis: lovenox GI prophylaxis:  famotidine Glucose control: Lantus and NovoLog Mobility: bed rest Code Status: full Family Communication: we will update patients family  Disposition: ICU for ventilator management  This patient is critically ill with multiple organ system failure; which, requires frequent high complexity decision making, assessment, support, evaluation, and titration of therapies. This was completed through the application of advanced monitoring technologies and extensive interpretation of multiple databases. During this encounter critical care time was devoted to patient care services described in this note for 32 minutes.  Josephine Igo, DO [facility] 10/20/2019 5:13 PM

## 2019-10-21 LAB — POCT I-STAT 7, (LYTES, BLD GAS, ICA,H+H)
Acid-Base Excess: 13 mmol/L — ABNORMAL HIGH (ref 0.0–2.0)
Bicarbonate: 38.8 mmol/L — ABNORMAL HIGH (ref 20.0–28.0)
Calcium, Ion: 1.15 mmol/L (ref 1.15–1.40)
HCT: 36 % — ABNORMAL LOW (ref 39.0–52.0)
Hemoglobin: 12.2 g/dL — ABNORMAL LOW (ref 13.0–17.0)
O2 Saturation: 61 %
Patient temperature: 98.7
Potassium: 3.9 mmol/L (ref 3.5–5.1)
Sodium: 139 mmol/L (ref 135–145)
TCO2: 40 mmol/L — ABNORMAL HIGH (ref 22–32)
pCO2 arterial: 56 mmHg — ABNORMAL HIGH (ref 32.0–48.0)
pH, Arterial: 7.449 (ref 7.350–7.450)
pO2, Arterial: 31 mmHg — CL (ref 83.0–108.0)

## 2019-10-21 LAB — GLUCOSE, CAPILLARY
Glucose-Capillary: 205 mg/dL — ABNORMAL HIGH (ref 70–99)
Glucose-Capillary: 174 mg/dL — ABNORMAL HIGH (ref 70–99)
Glucose-Capillary: 165 mg/dL — ABNORMAL HIGH (ref 70–99)
Glucose-Capillary: 252 mg/dL — ABNORMAL HIGH (ref 70–99)
Glucose-Capillary: 136 mg/dL — ABNORMAL HIGH (ref 70–99)
Glucose-Capillary: 155 mg/dL — ABNORMAL HIGH (ref 70–99)

## 2019-10-21 LAB — BASIC METABOLIC PANEL
Anion gap: 11 (ref 5–15)
BUN: 27 mg/dL — ABNORMAL HIGH (ref 6–20)
CO2: 34 mmol/L — ABNORMAL HIGH (ref 22–32)
Calcium: 8.7 mg/dL — ABNORMAL LOW (ref 8.9–10.3)
Chloride: 98 mmol/L (ref 98–111)
Creatinine, Ser: 0.65 mg/dL (ref 0.61–1.24)
GFR calc Af Amer: >60 mL/min (ref >60)
GFR calc non Af Amer: >60 mL/min (ref >60)
Glucose, Bld: 203 mg/dL — ABNORMAL HIGH (ref 70–99)
Potassium: 3.8 mmol/L (ref 3.5–5.1)
Sodium: 143 mmol/L (ref 135–145)

## 2019-10-21 LAB — CBC
HCT: 39.4 % (ref 39.0–52.0)
Hemoglobin: 12.2 g/dL — ABNORMAL LOW (ref 13.0–17.0)
MCH: 30.1 pg (ref 26.0–34.0)
MCHC: 31 g/dL (ref 30.0–36.0)
MCV: 97.3 fL (ref 80.0–100.0)
Platelets: 368 10*3/uL (ref 150–400)
RBC: 4.05 MIL/uL — ABNORMAL LOW (ref 4.22–5.81)
RDW: 11.9 % (ref 11.5–15.5)
WBC: 15.3 10*3/uL — ABNORMAL HIGH (ref 4.0–10.5)
nRBC: 0 % (ref 0.0–0.2)

## 2019-10-21 LAB — LACTIC ACID, PLASMA: Lactic Acid, Venous: 1.1 mmol/L (ref 0.5–1.9)

## 2019-10-21 MED ORDER — DEXMEDETOMIDINE HCL IN NACL 400 MCG/100ML IV SOLN
0.4000 ug/kg/h | INTRAVENOUS | Status: DC
  Administered 2019-10-21 (×3): 1.2 ug/kg/h via INTRAVENOUS
  Administered 2019-10-22: 1.1 ug/kg/h via INTRAVENOUS
  Administered 2019-10-22: 0.5 ug/kg/h via INTRAVENOUS
  Filled 2019-10-21: qty 200
  Filled 2019-10-21 (×2): qty 100
  Filled 2019-10-21: qty 200
  Filled 2019-10-21: qty 100

## 2019-10-21 MED ORDER — FENTANYL CITRATE (PF) 100 MCG/2ML IJ SOLN
INTRAMUSCULAR | Status: AC
  Filled 2019-10-21: qty 4

## 2019-10-21 MED ORDER — INSULIN GLARGINE 100 UNIT/ML ~~LOC~~ SOLN
35.0000 [IU] | Freq: Two times a day (BID) | SUBCUTANEOUS | Status: DC
  Administered 2019-10-21 – 2019-10-23 (×5): 35 [IU] via SUBCUTANEOUS
  Filled 2019-10-21 (×6): qty 0.35

## 2019-10-21 MED ORDER — MIDAZOLAM HCL 2 MG/2ML IJ SOLN
INTRAMUSCULAR | Status: AC
  Filled 2019-10-21: qty 4

## 2019-10-21 NOTE — Progress Notes (Signed)
NAME:  Micheal Bell, MRN:  [id number], DOB:  1970-12-12, LOS: 9 ADMISSION DATE:  10/09/2019, CONSULTATION DATE:  8/19  REFERRING MD:  Leafy Half, CHIEF COMPLAINT:  COVID 38   Brief History   49 year old male admitted 8/16 for COVID 19 pneumonia. 8/19 early AM decompensated requiring up to 70 L HHFNC and NRB with O2 sats in the 70s and complaints of "wearing out" PCCM consulted.  Also treated for staph bacteremia and UTI. Partially vaccinated, first dose of vaccine was on 8/10. Developed cold symptoms on 8/11.  Past Medical History  Hypertension Gout DM2 CHF Asthma Irritable bowel syndrome fibromyalgia   Significant Hospital Events   8/16 present to [facility], COVID pos on 4L 8/19 transfer to [facility] on 70L HF and NRB, transferred to ICU due to respiratory fatigue, intubated, prone 8/20 prone, diuresed  Consults:    Procedures:  8/19 ETT >  8/19 R arm PICC >  8/23 self extubation and reintubation 8/28 extubate  Significant Diagnostic Tests:  8/19 TTE > LVEF 30-35%. LVEF appears to be depressed with  diffuse hypokinesis, worse in the mid/distal inferior/inferoseptal walls.   8/20 left foot X -ray healing fractures of left foot (4-5th metatarsal)  Micro Data:  8/16 SARS COV 2 > positive 8/16 Urine > MSSA 8/16 Blood > staph aureus 2/2, group B strep 2/2 8/19 wound> staph aureus  Antimicrobials/COVID Rx:  8/16 remdesivir >  8/19 Baricitinib >  8/19 solumedrol >   ========  8/17 ceftriaxone > 8/19 8/17 vanc > 8/19 8/19 cefazolin >  Interim history/subjective:  No acute event overnight  Patient was extubated today.  He does have good cough reflex.  Currently satting in the high 90s on 8 L of nasal cannula.  Continue to monitor overnight  I called and spoke to patient's mother, Harriett Sine.  All questions were answered.  She states that she will try to video chat him tonight  Objective   Blood pressure (!) 103/91, pulse 99, temperature 99.6 F (37.6 C), temperature  source Axillary, resp. rate 20, height 6\' 2"  (1.88 m), weight 116.1 kg, SpO2 92 %.    Vent Mode: PRVC FiO2 (%):  [30 %-100 %] 100 % Set Rate:  [18 bmp] 18 bmp Vt Set:  [400 mL] 400 mL PEEP:  [8 cmH20] 8 cmH20 Pressure Support:  [8 cmH20] 8 cmH20 Plateau Pressure:  [19 cmH20-20 cmH20] 20 cmH20   Intake/Output Summary (Last 24 hours) at 10/21/2019 1612 Last data filed at 10/21/2019 1500 Gross per 24 hour  Intake 3379.78 ml  Output 3670 ml  Net -290.22 ml   Filed Weights   10/19/19 0500 10/20/19 0402 10/21/19 0500  Weight: 117.6 kg 121.2 kg 116.1 kg    Examination: Physical Exam Constitutional:      General: He is not in acute distress. HENT:     Head: Normocephalic.  Eyes:     General: No scleral icterus.       Right eye: No discharge.        Left eye: No discharge.     Conjunctiva/sclera: Conjunctivae normal.  Cardiovascular:     Rate and Rhythm: Normal rate and regular rhythm.     Heart sounds: Normal heart sounds. No murmur heard.   Pulmonary:     Comments: Still limited air movement Abdominal:     General: Bowel sounds are normal. There is no distension.     Palpations: Abdomen is soft.  Musculoskeletal:     Cervical back: Normal range of  motion.     Right lower leg: No edema.     Left lower leg: No edema.     Comments: Left foot diabetic wound, nonpurulent discharge  Skin:    General: Skin is warm.     Coloration: Skin is not jaundiced.  Neurological:     Comments: Alert and able to follow simple commands     Resolved Hospital Problem list     Assessment & Plan:  Acute hypoxic respiratory failure requiring mechanical ventilation COVID-19 pneumonia Acute on chronic congestive heart failure MSSA bacteremia Diabetic foot wound Hypernatremia Hypokalemia Diabetes with hyperglycemia   Plan:  Acute hypoxic respiratory failure requiring mechanical ventilation COVID-19 pneumonia Underlying asthma Patient was extubated today.  SPO2 in the high 90s on 8  L of nasal cannula.  Patient did have good cough reflex.  We will continue to monitor overnight. -Continue oxygen   CVA? No for movement of right lower extremity but he does move his right upper extremity.  CT head was negative.  No MRI was obtained.  Sound duplex negative for DVT -May need an MRI when he is more stable   Acute on chronic systolic CHF Echo 8/19 shows EF of 30 to 35% with regional wall motion abnormalities.  Patient has good urine output in the last 24 hours. -Continue Lasix 40 daily -Strict I/O -Daily weight   MSSA bacteremia Diabetic foot wound ID is on board.  Blood culture collected on 8/19 was negative.  Continue cefazolin for MSSA bacteremia.  Unclear if there is osteomyelitis involvement, will need MRI when he is more stable. -continue cefazolin    Hypernatremia  Current sodium 143 - follow bmp -Continue free water flushes    DM2 with difficult to control hyperglycemia cbg goal <180 -Increase Lantus 35 twice daily -NovoLog 8 units every 4 hours -Sliding scale insulin -Monitor CBG  Best practice:  Diet: tube feeding Pain/Anxiety/Delirium protocol (if indicated): Precedex VAP protocol (if indicated): yes DVT prophylaxis: lovenox GI prophylaxis:  famotidine Glucose control: Lantus and NovoLog Mobility: bed rest Code Status: full Family Communication: Updated mother Disposition: ICU  Critical care time: 67 minutes  Doran Stabler, DO Internal Medicine Residency My pager: [phone]

## 2019-10-21 NOTE — Procedures (Signed)
Extubation Procedure Note  Patient Details:   Name: KEDRON UNO DOB: 1971/02/14 MRN: [id number]   Airway Documentation:    Vent end date: 10/21/19 Vent end time: 1450   Evaluation  O2 sats: stable throughout Complications: No apparent complications Patient did tolerate procedure well. Bilateral Breath Sounds: Diminished, Inspiratory wheezes   Yes   Pt extubated to 8L HFNC. Positive cuff leak noted prior to extubation. Pt unable/unwilling to speak or cough post extubation. MD at bedside. VS within normal limits  Carolan Shiver 10/21/2019, 2:53 PM

## 2019-10-22 LAB — GLUCOSE, CAPILLARY
Glucose-Capillary: 144 mg/dL — ABNORMAL HIGH (ref 70–99)
Glucose-Capillary: 166 mg/dL — ABNORMAL HIGH (ref 70–99)
Glucose-Capillary: 178 mg/dL — ABNORMAL HIGH (ref 70–99)
Glucose-Capillary: 187 mg/dL — ABNORMAL HIGH (ref 70–99)
Glucose-Capillary: 190 mg/dL — ABNORMAL HIGH (ref 70–99)
Glucose-Capillary: 206 mg/dL — ABNORMAL HIGH (ref 70–99)

## 2019-10-22 LAB — CBC
HCT: 36.7 % — ABNORMAL LOW (ref 39.0–52.0)
Hemoglobin: 11.6 g/dL — ABNORMAL LOW (ref 13.0–17.0)
MCH: 29.7 pg (ref 26.0–34.0)
MCHC: 31.6 g/dL (ref 30.0–36.0)
MCV: 94.1 fL (ref 80.0–100.0)
Platelets: 319 10*3/uL (ref 150–400)
RBC: 3.9 MIL/uL — ABNORMAL LOW (ref 4.22–5.81)
RDW: 11.6 % (ref 11.5–15.5)
WBC: 14.3 10*3/uL — ABNORMAL HIGH (ref 4.0–10.5)
nRBC: 0 % (ref 0.0–0.2)

## 2019-10-22 LAB — BASIC METABOLIC PANEL
Anion gap: 10 (ref 5–15)
BUN: 19 mg/dL (ref 6–20)
CO2: 30 mmol/L (ref 22–32)
Calcium: 8.1 mg/dL — ABNORMAL LOW (ref 8.9–10.3)
Chloride: 98 mmol/L (ref 98–111)
Creatinine, Ser: 0.53 mg/dL — ABNORMAL LOW (ref 0.61–1.24)
GFR calc Af Amer: >60 mL/min (ref >60)
GFR calc non Af Amer: >60 mL/min (ref >60)
Glucose, Bld: 185 mg/dL — ABNORMAL HIGH (ref 70–99)
Potassium: 3.3 mmol/L — ABNORMAL LOW (ref 3.5–5.1)
Sodium: 138 mmol/L (ref 135–145)

## 2019-10-22 MED ORDER — POTASSIUM CHLORIDE 20 MEQ/15ML (10%) PO SOLN
40.0000 meq | Freq: Two times a day (BID) | ORAL | Status: AC
  Administered 2019-10-22 (×2): 40 meq
  Filled 2019-10-22 (×2): qty 30

## 2019-10-22 MED ORDER — METOPROLOL TARTRATE 12.5 MG HALF TABLET
12.5000 mg | ORAL_TABLET | Freq: Two times a day (BID) | ORAL | Status: AC
  Administered 2019-10-22 – 2019-10-23 (×3): 12.5 mg
  Filled 2019-10-22 (×3): qty 1

## 2019-10-22 MED ORDER — METOPROLOL TARTRATE 12.5 MG HALF TABLET
12.5000 mg | ORAL_TABLET | Freq: Every day | ORAL | Status: DC
  Administered 2019-10-22: 12.5 mg
  Filled 2019-10-22: qty 1

## 2019-10-22 NOTE — Progress Notes (Signed)
Patient was extubated 8/28 and SPO2 is in the high 90s on 4 L of nasal cannula.  Patient will be transferred to 5 W. I signed off to [facility] hospitalist.  Doran Stabler, DO Internal Medicine Residency My pager: [phone]

## 2019-10-22 NOTE — Progress Notes (Signed)
NAME:  Micheal Bell, MRN:  [id number], DOB:  [birth year], LOS: 10 ADMISSION DATE:  10/09/2019, CONSULTATION DATE:  8/19  REFERRING MD:  Leafy Half, CHIEF COMPLAINT:  COVID 84   Brief History   49 year old male admitted 8/16 for COVID 19 pneumonia. 8/19 early AM decompensated requiring up to 70 L HHFNC and NRB with O2 sats in the 70s and complaints of "wearing out" PCCM consulted.  Also treated for staph bacteremia and UTI. Partially vaccinated, first dose of vaccine was on 8/10. Developed cold symptoms on 8/11.  Past Medical History  Hypertension Gout DM2 CHF Asthma Irritable bowel syndrome fibromyalgia   Significant Hospital Events   8/16 present to [facility], COVID pos on 4L 8/19 transfer to [facility] on 70L HF and NRB, transferred to ICU due to respiratory fatigue, intubated, prone 8/20 prone, diuresed  Consults:    Procedures:  8/19 ETT >  8/19 R arm PICC >  8/23 self extubation and reintubation 8/28 extubate  Significant Diagnostic Tests:  8/19 TTE > LVEF 30-35%. LVEF appears to be depressed with  diffuse hypokinesis, worse in the mid/distal inferior/inferoseptal walls.   8/20 left foot X -ray healing fractures of left foot (4-5th metatarsal)  Micro Data:  8/16 SARS COV 2 > positive 8/16 Urine > MSSA 8/16 Blood > staph aureus 2/2, group B strep 2/2 8/19 wound> staph aureus  Antimicrobials/COVID Rx:  8/16 remdesivir >  8/19 Baricitinib >  8/19 solumedrol >   ========  8/17 ceftriaxone > 8/19 8/17 vanc > 8/19 8/19 cefazolin >  Interim history/subjective:  No acute event overnight  Patient was extubated yesterday.  He appears comfortable and in no acute respiratory distress.  He is satting in the high 90s on 8 L of nasal cannula.  Patient is alert and only oriented to person.  He is able to move his right lower extremity and states that his right leg has always been stiff.  He moves other extremities normally.  I called and spoke to patient's sister and  mother.  I updated them with patient current medical condition and transfer to regular Covid floor.  They voiced understanding.  All questions were answered.  Objective   Blood pressure (!) 146/83, pulse 82, temperature 99.6 F (37.6 C), temperature source Oral, resp. rate (!) 24, height 6\' 2"  (1.88 m), weight 116.4 kg, SpO2 98 %.    Vent Mode: Stand-by FiO2 (%):  [100 %] 100 % Set Rate:  [18 bmp] 18 bmp Vt Set:  [400 mL] 400 mL PEEP:  [8 cmH20] 8 cmH20   Intake/Output Summary (Last 24 hours) at 10/22/2019 0814 Last data filed at 10/22/2019 0600 Gross per 24 hour  Intake 2805.16 ml  Output 2977 ml  Net -171.84 ml   Filed Weights   10/20/19 0402 10/21/19 0500 10/22/19 0423  Weight: 121.2 kg 116.1 kg 116.4 kg    Examination: Physical Exam Constitutional:      General: He is not in acute distress.    Comments: Pleasant  HENT:     Head: Normocephalic.  Eyes:     General: No scleral icterus.       Right eye: No discharge.        Left eye: No discharge.     Extraocular Movements: Extraocular movements intact.     Conjunctiva/sclera: Conjunctivae normal.  Cardiovascular:     Rate and Rhythm: Regular rhythm. Tachycardia present.     Heart sounds: Normal heart sounds. No murmur heard.   Pulmonary:  Effort: Pulmonary effort is normal. No respiratory distress.     Comments: Clear lungs sound Abdominal:     General: Bowel sounds are normal. There is no distension.     Palpations: Abdomen is soft.  Musculoskeletal:     Cervical back: Normal range of motion.     Right lower leg: No edema.     Left lower leg: No edema.     Comments: Able to flex right knee but unable to move right toes Able to move other extremities normally  Skin:    General: Skin is warm.     Coloration: Skin is not jaundiced.  Neurological:     Comments: Alert and only oriented to person  Psychiatric:        Mood and Affect: Mood normal.     Resolved Hospital Problem list     Assessment & Plan:    Acute hypoxic respiratory failure requiring mechanical ventilation COVID-19 pneumonia Acute on chronic congestive heart failure MSSA bacteremia Diabetic foot wound Hypernatremia Hypokalemia Diabetes with hyperglycemia   Plan:  Acute hypoxic respiratory failure requiring mechanical ventilation COVID-19 pneumonia Underlying asthma Patient was extubated yesterday.  Patient has good cough reflex.  SpO2 was in the high 90s on 8 L so wean down to 4 L.  Continue to monitor  -Continue oxygen -Transfer to regular Covid floor   CVA? Patient is able to move right lower extremity but limited motion.  He states this is a chronic problem.  This is unlikely to be CVA.  CT head was negative.  No MRI was obtained.   -No MRI was indicated at this time   Acute on chronic systolic CHF Echo 8/19 shows EF of 30 to 35% with regional wall motion abnormalities.  Patient has good urine output in the last 24 hours. -Continue Lasix 40 daily -Strict I/O -Daily weight   MSSA bacteremia Diabetic foot wound ID is on board.  Blood culture collected on 8/19 was negative.  Continue cefazolin for MSSA bacteremia.  Unclear if there is osteomyelitis involvement, will need MRI when he is more stable. -continue cefazolin  -Obtain MRI when patient is stable -Reach out to ID   Hypernatremia resolved - follow bmp -DC free water   DM2 with difficult to control hyperglycemia cbg goal <180 -Lantus 35 twice daily -NovoLog 8 units every 4 hours -Sliding scale insulin -Monitor CBG  Best practice:  Diet: tube feeding Pain/Anxiety/Delirium protocol (if indicated): N/A VAP protocol (if indicated): N/A DVT prophylaxis: lovenox GI prophylaxis:  famotidine Glucose control: Lantus and NovoLog Mobility: bed rest Code Status: full Family Communication: Updated mother and sister Disposition: Transfer to regular Covid floor  Critical care time: 25 minutes  Doran Stabler, DO Internal Medicine Residency My  pager: [phone]

## 2019-10-22 NOTE — Progress Notes (Signed)
[name] Ceciley Buist,RN and Burke Keels, RN wasted Fentanyl infusion 275 mls from the  bag and IV line in the stericycle.

## 2019-10-22 NOTE — Progress Notes (Signed)
eLink Physician-Brief Progress Note Patient Name: Micheal Bell DOB: 1970-08-10 MRN: [id number]   Date of Service  10/22/2019  HPI/Events of Note  Hypertension.  eICU Interventions  Increased metoprolol to 12.5mg  BID. May give a dose now.     Intervention Category Intermediate Interventions: Hypertension - evaluation and management  Marveen Reeks Mansour Balboa 10/22/2019, 9:45 PM

## 2019-10-23 ENCOUNTER — Inpatient Hospital Stay (HOSPITAL_COMMUNITY): Payer: Medicaid Other

## 2019-10-23 ENCOUNTER — Encounter (HOSPITAL_COMMUNITY): Payer: Medicaid Other

## 2019-10-23 DIAGNOSIS — J9601 Acute respiratory failure with hypoxia: Secondary | ICD-10-CM

## 2019-10-23 DIAGNOSIS — I5021 Acute systolic (congestive) heart failure: Secondary | ICD-10-CM

## 2019-10-23 DIAGNOSIS — I749 Embolism and thrombosis of unspecified artery: Secondary | ICD-10-CM

## 2019-10-23 DIAGNOSIS — E118 Type 2 diabetes mellitus with unspecified complications: Secondary | ICD-10-CM

## 2019-10-23 LAB — URINALYSIS, ROUTINE W REFLEX MICROSCOPIC
Bacteria, UA: NONE SEEN
Bilirubin Urine: NEGATIVE
Glucose, UA: 50 mg/dL — AB
Hgb urine dipstick: NEGATIVE
Ketones, ur: NEGATIVE mg/dL
Nitrite: NEGATIVE
Protein, ur: NEGATIVE mg/dL
Specific Gravity, Urine: 1.019 (ref 1.005–1.030)
pH: 7 (ref 5.0–8.0)

## 2019-10-23 LAB — BASIC METABOLIC PANEL
Anion gap: 12 (ref 5–15)
BUN: 21 mg/dL — ABNORMAL HIGH (ref 6–20)
CO2: 28 mmol/L (ref 22–32)
Calcium: 9 mg/dL (ref 8.9–10.3)
Chloride: 99 mmol/L (ref 98–111)
Creatinine, Ser: 0.66 mg/dL (ref 0.61–1.24)
GFR calc Af Amer: >60 mL/min (ref >60)
GFR calc non Af Amer: >60 mL/min (ref >60)
Glucose, Bld: 217 mg/dL — ABNORMAL HIGH (ref 70–99)
Potassium: 3.9 mmol/L (ref 3.5–5.1)
Sodium: 139 mmol/L (ref 135–145)

## 2019-10-23 LAB — CBC
HCT: 40.9 % (ref 39.0–52.0)
Hemoglobin: 13 g/dL (ref 13.0–17.0)
MCH: 29.8 pg (ref 26.0–34.0)
MCHC: 31.8 g/dL (ref 30.0–36.0)
MCV: 93.8 fL (ref 80.0–100.0)
Platelets: 523 10*3/uL — ABNORMAL HIGH (ref 150–400)
RBC: 4.36 MIL/uL (ref 4.22–5.81)
RDW: 11.9 % (ref 11.5–15.5)
WBC: 18.7 10*3/uL — ABNORMAL HIGH (ref 4.0–10.5)
nRBC: 0 % (ref 0.0–0.2)

## 2019-10-23 LAB — GLUCOSE, CAPILLARY
Glucose-Capillary: 190 mg/dL — ABNORMAL HIGH (ref 70–99)
Glucose-Capillary: 263 mg/dL — ABNORMAL HIGH (ref 70–99)
Glucose-Capillary: 317 mg/dL — ABNORMAL HIGH (ref 70–99)
Glucose-Capillary: 292 mg/dL — ABNORMAL HIGH (ref 70–99)
Glucose-Capillary: 272 mg/dL — ABNORMAL HIGH (ref 70–99)
Glucose-Capillary: 254 mg/dL — ABNORMAL HIGH (ref 70–99)
Glucose-Capillary: 182 mg/dL — ABNORMAL HIGH (ref 70–99)

## 2019-10-23 LAB — VITAMIN B12: Vitamin B-12: 1853 pg/mL — ABNORMAL HIGH (ref 180–914)

## 2019-10-23 LAB — PROCALCITONIN: Procalcitonin: <0.1 ng/mL

## 2019-10-23 LAB — D-DIMER, QUANTITATIVE: D-Dimer, Quant: 5.19 ug/mL-FEU — ABNORMAL HIGH (ref 0.00–0.50)

## 2019-10-23 LAB — LACTIC ACID, PLASMA
Lactic Acid, Venous: 2.3 mmol/L (ref 0.5–1.9)
Lactic Acid, Venous: 1.1 mmol/L (ref 0.5–1.9)

## 2019-10-23 LAB — AMMONIA: Ammonia: 22 umol/L (ref 9–35)

## 2019-10-23 LAB — TSH: TSH: 1.422 u[IU]/mL (ref 0.350–4.500)

## 2019-10-23 IMAGING — DX DG FOOT 2V*L*
2 series · 2 of 2 positions shown · non-contrast
Comparison: [DATE] and prior radiographs

CLINICAL DATA: Bacteremia, LEFT foot pain and diabetic.

EXAM:
LEFT FOOT - 2 VIEW

[foot ap]
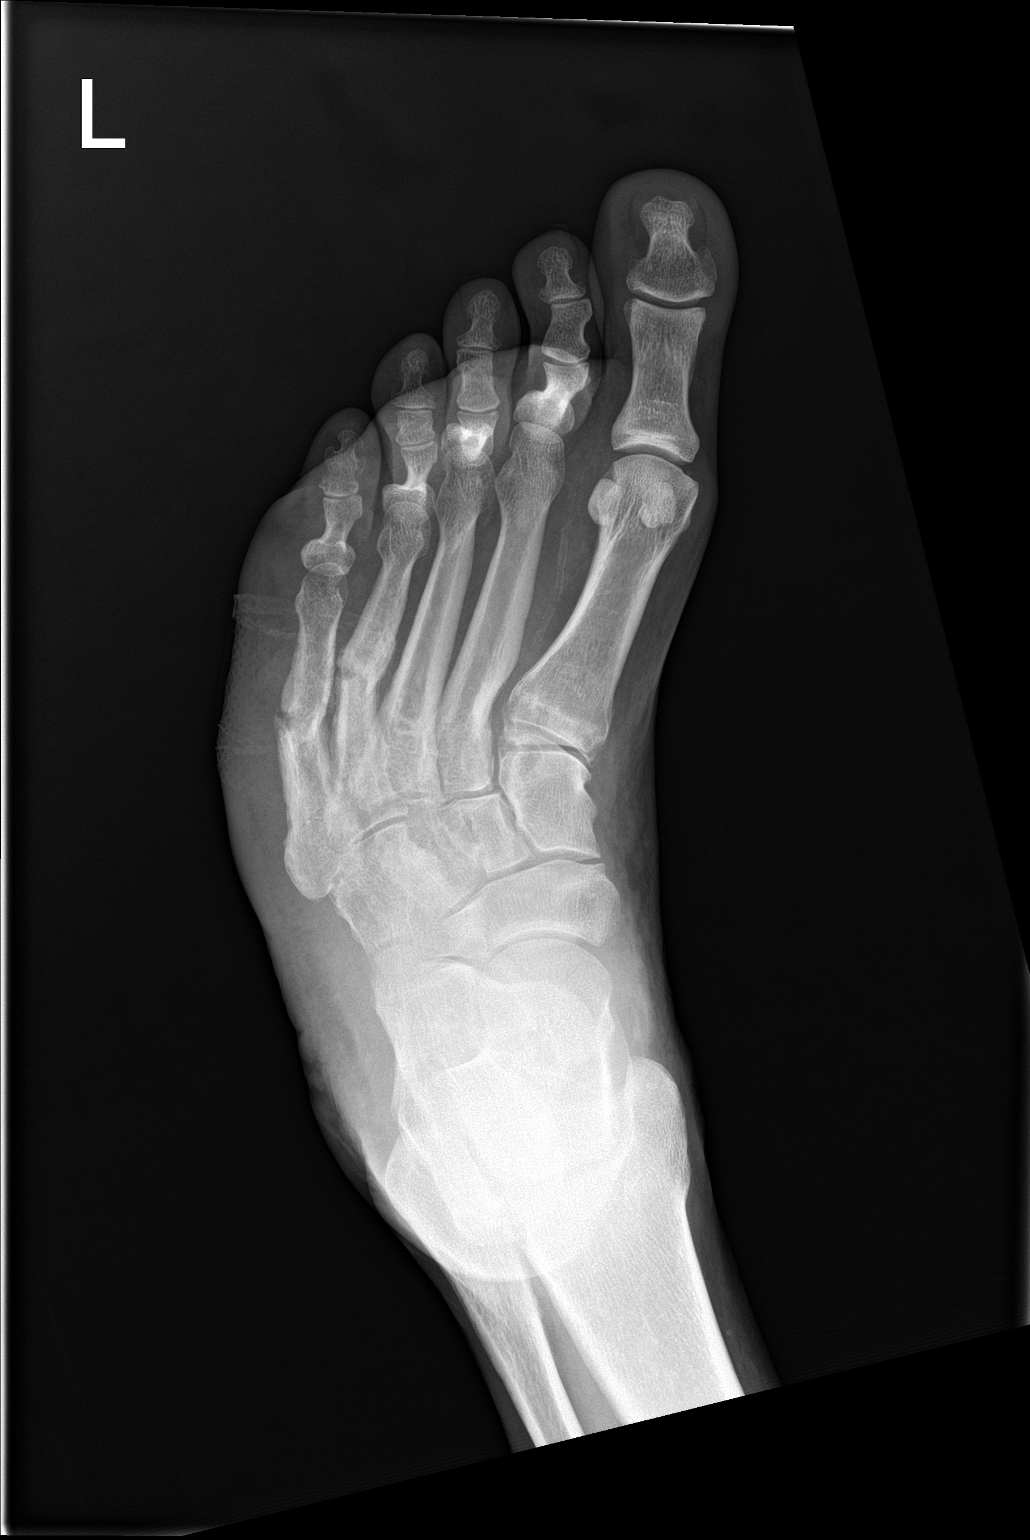

[foot lat]
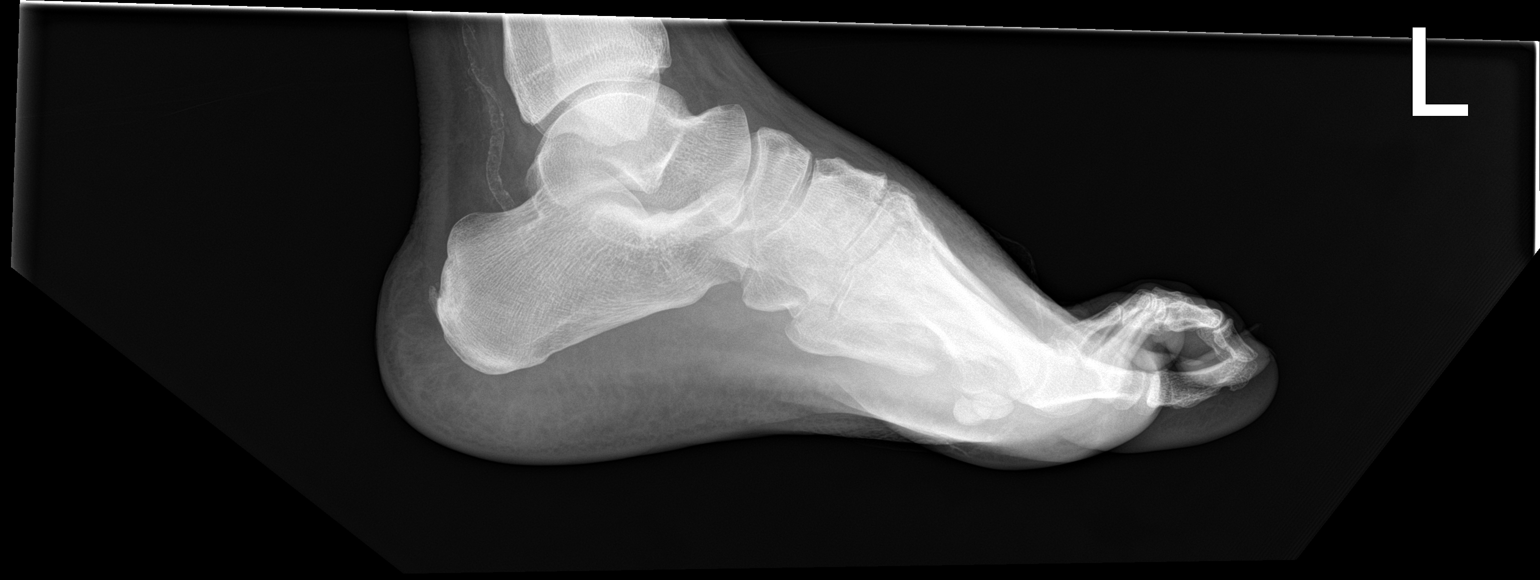

[2 of 2 positions shown; findings below may reference images not displayed]

FINDINGS: Mildly angulated fractures of the 4th and 5th metatarsals are again
noted with fracture lines still evident. Healing changes along these
fractures are noted.

Overlying soft tissue swelling is noted.

No new fracture, subluxation or dislocation noted.

No definite radiographic evidence of acute osteomyelitis noted.
IMPRESSION: Mildly angulated healing fractures of the 4th and 5th metatarsals
with soft tissue swelling again noted but without definite
radiographic evidence of acute osteomyelitis. Consider MRI for
further evaluation as clinically indicated.

## 2019-10-23 IMAGING — CT CT ANGIO CHEST
2 of 6 series · 17 of 46 positions shown · IV contrast (omnipaque)
Comparison: None.

CLINICAL DATA: COVID pneumonia, acute hypoxic respiratory failure,
positive D-dimer

EXAM:
CT ANGIOGRAPHY CHEST WITH CONTRAST
TECHNIQUE: Multidetector CT imaging of the chest was performed using the
standard protocol during bolus administration of intravenous
contrast. Multiplanar CT image reconstructions and MIPs were
obtained to evaluate the vascular anatomy.
CONTRAST:  75mL OMNIPAQUE IOHEXOL 350 MG/ML SOLN

[Series 6: thins · axial · 0.78mm/px · z∈[+1254,+1468]mm · 14 of 236 slices shown]
[im 11/236  lung]
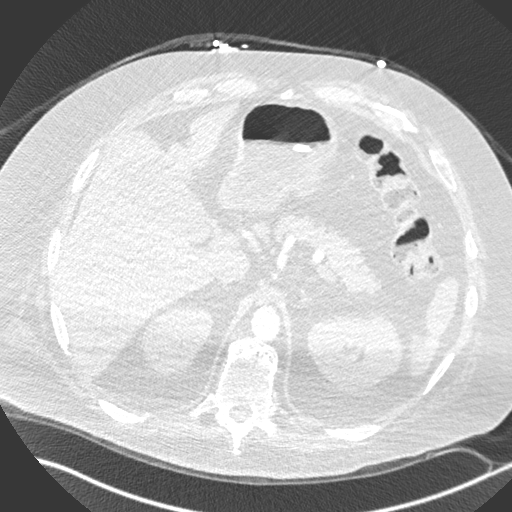
[im 31/236  soft-tissue]
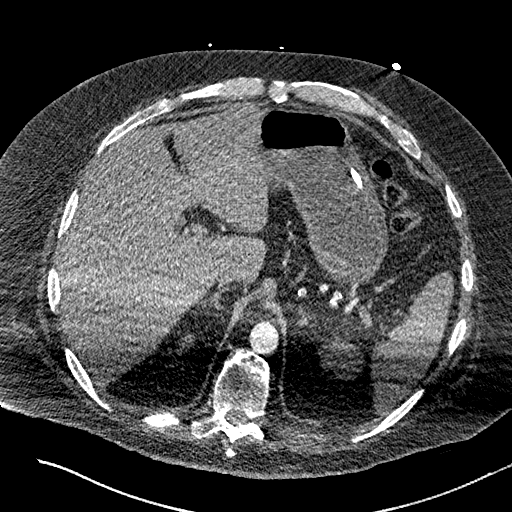
[im 41/236  lung]
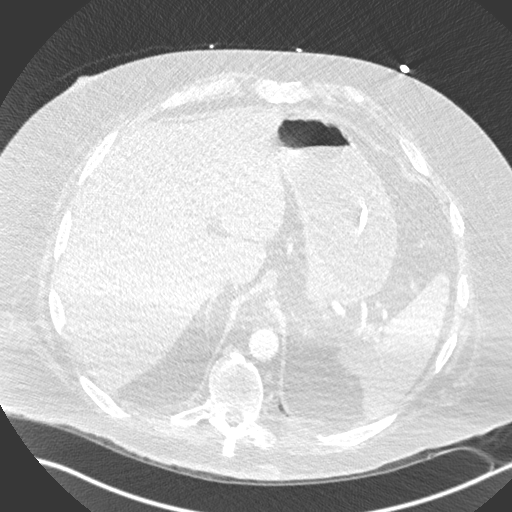
[im 62/236  soft-tissue]
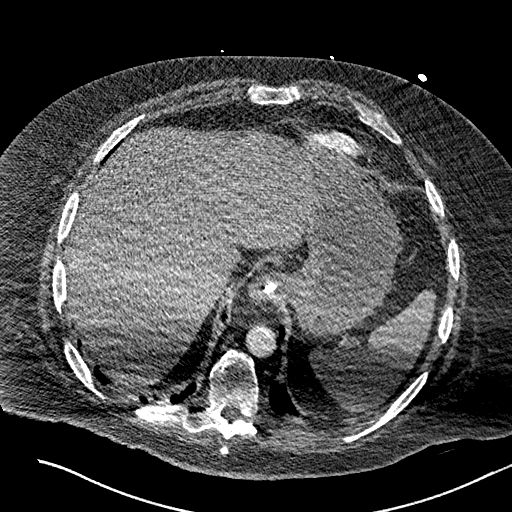
[im 82/236  lung]
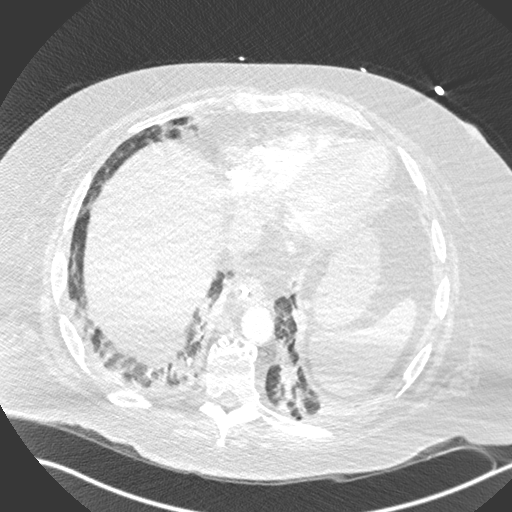
[im 92/236  soft-tissue]
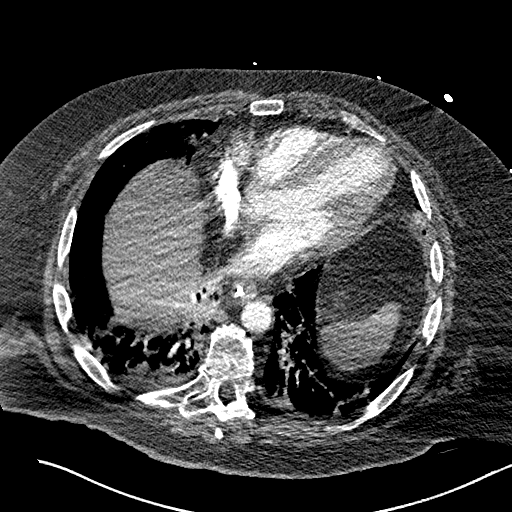
[im 113/236  lung]
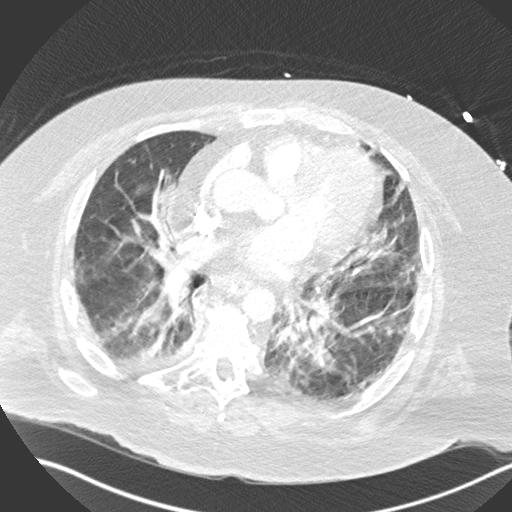
[im 123/236  soft-tissue]
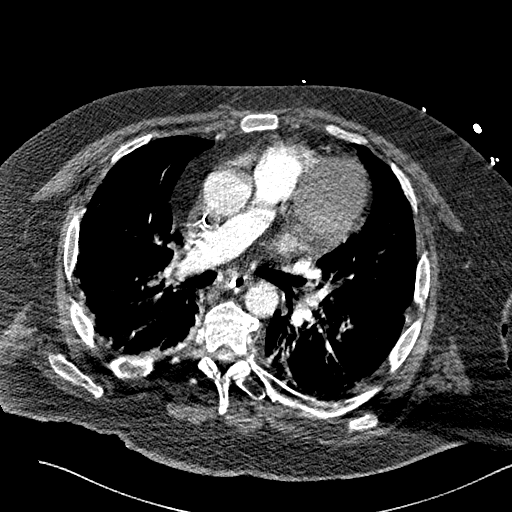
[im 144/236  lung]
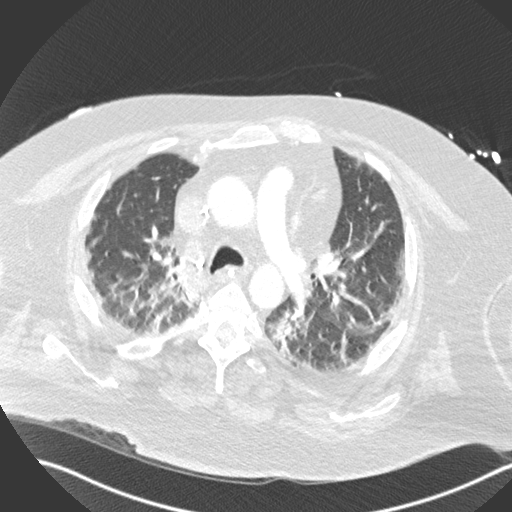
[im 154/236  soft-tissue]
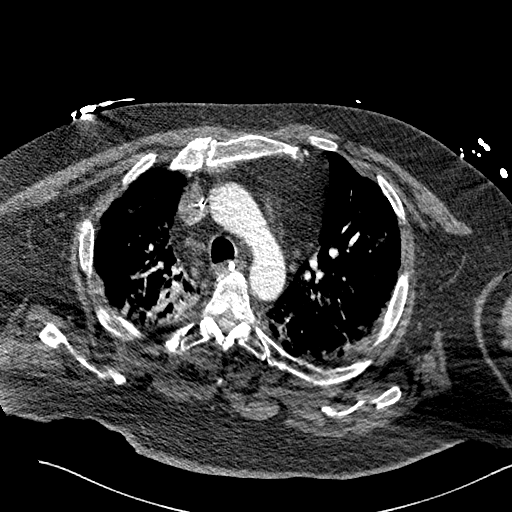
[im 174/236  lung]
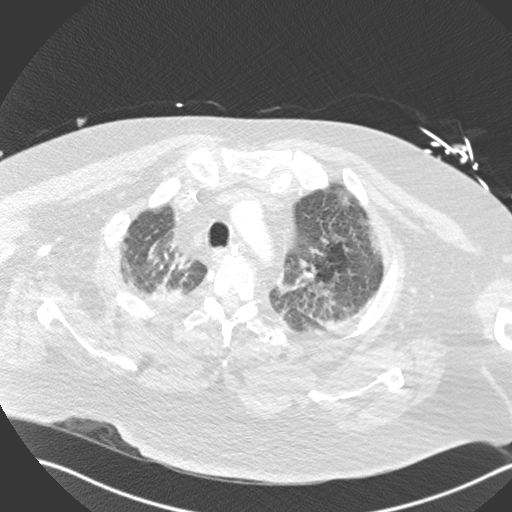
[im 195/236  soft-tissue]
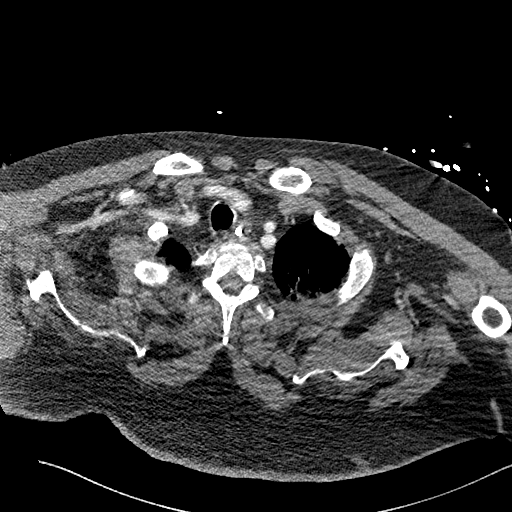
[im 205/236  lung]
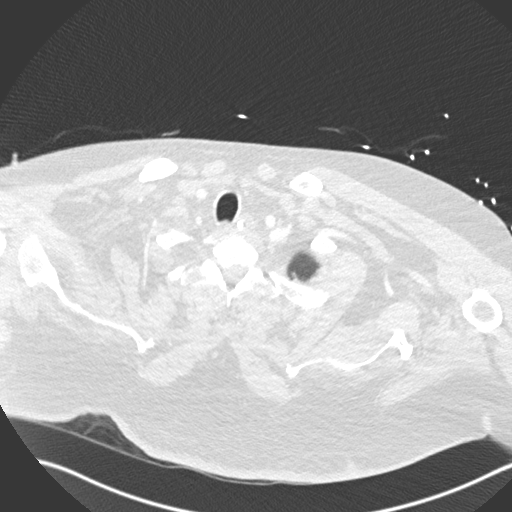
[im 225/236  soft-tissue]
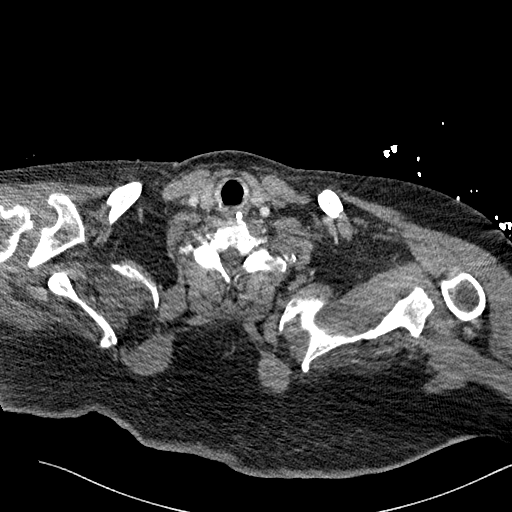

[Series 8: coronal mpr · coronal · 0.52mm/px · 3 of 147 slices shown]
[im 37/147  soft-tissue]
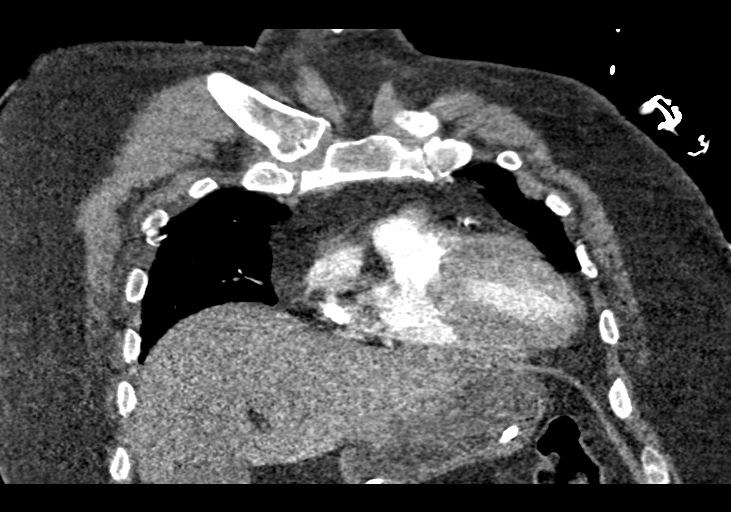
[im 74/147  soft-tissue]
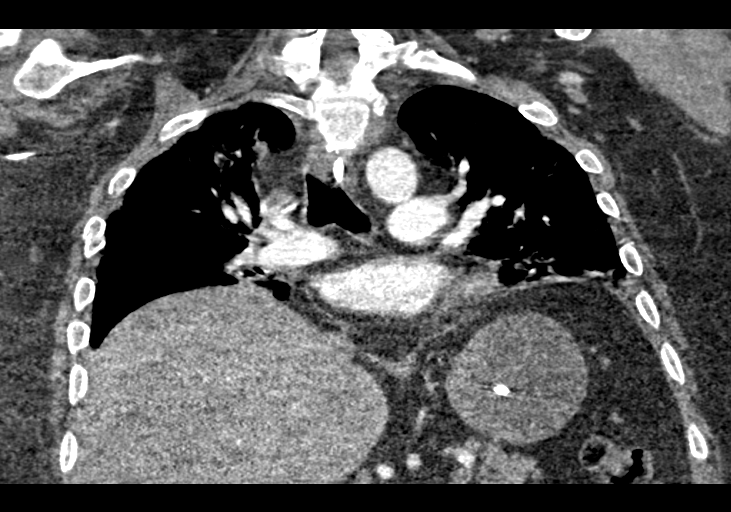
[im 110/147  soft-tissue]
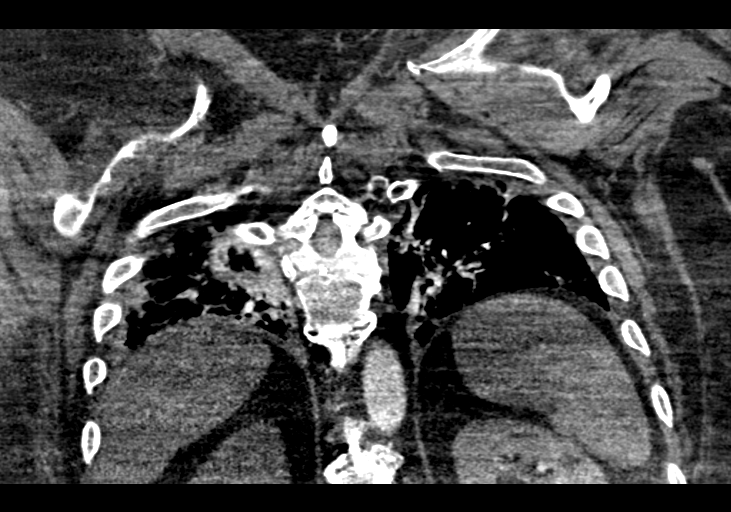

[17 of 46 positions shown; findings below may reference images not displayed]

FINDINGS: Cardiovascular: Satisfactory opacification of the pulmonary arteries
to the segmental level. No evidence of pulmonary embolism. The
central pulmonary arteries are of normal caliber. There is moderate
coronary artery calcification. Normal heart size. No pericardial
effusion. The thoracic aorta is unremarkable save for bovine arch
anatomy. Right upper extremity PICC line tip is seen within the
superior vena cava.

Mediastinum/Nodes: No enlarged mediastinal, hilar, or axillary lymph
nodes. Thyroid gland, trachea, and esophagus demonstrate no
significant findings. Nasoenteric feeding tube extends into the
upper abdomen beyond the margin of the examination.

Lungs/Pleura: Lung volumes are small. Evaluation is limited by
motion artifact, however, multifocal pulmonary infiltrates are
identified demonstrating a peripheral and basal predominance, likely
infectious or inflammatory in the acute setting. No central
obstructing lesion. No pneumothorax or pleural effusion. No
superimposed interstitial edema.

Upper Abdomen: No acute abnormality.

Musculoskeletal: No chest wall abnormality. No acute or significant
osseous findings.

Review of the MIP images confirms the above findings.
IMPRESSION: 1. No evidence of pulmonary embolism.
2. Multifocal pulmonary infiltrates are identified demonstrating a
peripheral and basal predominance, likely infectious or inflammatory
in the acute setting.
3. Moderate coronary artery calcification.

## 2019-10-23 MED ORDER — LACTATED RINGERS IV BOLUS: 500.0000 mL | Freq: Once | INTRAVENOUS | Status: DC

## 2019-10-23 MED ORDER — METOPROLOL SUCCINATE ER 25 MG PO TB24
25.0000 mg | ORAL_TABLET | Freq: Every day | ORAL | Status: DC
  Administered 2019-10-24 – 2019-10-26 (×3): 25 mg via ORAL
  Filled 2019-10-23 (×4): qty 1

## 2019-10-23 MED ORDER — LIVING WELL WITH DIABETES BOOK
Freq: Once | Status: AC
  Filled 2019-10-23: qty 1

## 2019-10-23 MED ORDER — OXYCODONE HCL 5 MG PO TABS: 5.0000 mg | ORAL_TABLET | ORAL | Status: DC | PRN

## 2019-10-23 MED ORDER — LACTATED RINGERS IV BOLUS
500.0000 mL | Freq: Once | INTRAVENOUS | Status: AC
  Administered 2019-10-23: 500 mL via INTRAVENOUS

## 2019-10-23 MED ORDER — IOHEXOL 350 MG/ML SOLN
75.0000 mL | Freq: Once | INTRAVENOUS | Status: AC | PRN
  Administered 2019-10-23: 75 mL via INTRAVENOUS

## 2019-10-23 MED ORDER — LINAGLIPTIN 5 MG PO TABS
5.0000 mg | ORAL_TABLET | Freq: Every day | ORAL | Status: DC
  Administered 2019-10-24 – 2019-11-06 (×14): 5 mg via ORAL
  Filled 2019-10-23 (×14): qty 1

## 2019-10-23 MED ORDER — ACETAMINOPHEN 325 MG PO TABS
650.0000 mg | ORAL_TABLET | Freq: Four times a day (QID) | ORAL | Status: AC | PRN
  Administered 2019-10-23: 650 mg via ORAL
  Filled 2019-10-23: qty 2

## 2019-10-23 MED ORDER — LACTATED RINGERS IV BOLUS
250.0000 mL | Freq: Once | INTRAVENOUS | Status: AC
  Administered 2019-10-23: 250 mL via INTRAVENOUS

## 2019-10-23 MED ORDER — INSULIN GLARGINE 100 UNIT/ML ~~LOC~~ SOLN
40.0000 [IU] | Freq: Two times a day (BID) | SUBCUTANEOUS | Status: DC
  Administered 2019-10-23 – 2019-10-24 (×2): 40 [IU] via SUBCUTANEOUS
  Filled 2019-10-23 (×3): qty 0.4

## 2019-10-23 NOTE — Progress Notes (Addendum)
Patient right foot is cold to touch, doppler DP, and could palpate TP MD at bedside and doppler ordered, Patient transfer to 5 west  With belongings including ring, report called and SWOT transported to [address]

## 2019-10-23 NOTE — Progress Notes (Signed)
   10/23/19 1800  Assess: MEWS Score  Temp 97.8 F (36.6 C)  BP 124/82  Pulse Rate (!) 111  ECG Heart Rate (!) 111  Resp (!) 32  Level of Consciousness Alert  SpO2 91 %  O2 Device Nasal Cannula  Patient Activity (if Appropriate) In bed  Assess: MEWS Score  MEWS Temp 0  MEWS Systolic 0  MEWS Pulse 2  MEWS RR 2  MEWS LOC 0  MEWS Score 4  MEWS Score Color Red  Assess: if the MEWS score is Yellow or Red  Were vital signs taken at a resting state? Yes  Focused Assessment No change from prior assessment  Early Detection of Sepsis Score *See Row Information* Low  MEWS guidelines implemented *See Row Information* Yes  Notified MD that patient of patient left foot wound, wound care orders on the chart and additional screening Reported off to next nurse

## 2019-10-23 NOTE — H&P (View-Only) (Signed)
Cardiology Consultation:   Patient ID: Micheal Bell; [id number]; 07/31/1970   Admit date: 10/09/2019 Date of Consult: 10/23/2019  Primary Care Provider: [name], MD Primary Cardiologist: New to [facility]   Patient Profile:   Micheal Bell is a 49 y.o. male with a hx of DM2, asthma, history of DVT, OSA not on CPAP who is being seen today for the evaluation of acute CHF at the request of Dr. Florene Glen.  History of Present Illness:   Micheal Bell is a 49 year old male with a history of type 2 diabetes who initially presented to [place] ED on 10/09/2019 with a 1 week history of cough, congestion, myalgias and worsening shortness of breath found to be Covid positive on ED evaluation. He was given Decadron 60m x1 on 8/16, remdesivir, vancomycin, ceftriaxone, insulin, potassium supplementation, magnesium supplementation, and IVF. Blood cultures found to be positive for Streptococcus and Staphylococcus with elevated lactic acid.  [facility] Ultimately, there were no beds available therefore he was held and treated until transfer 10/12/2019.   [place] to [place] requiring HFNC 100% and subsequently needed transfer to ICU for ETT intubation. Echocardiogram performed 10/12/2019 with an LVEF at 30 to 35% with indeterminate LV diastolic parameters with no prior study for comparison.  Unclear if this is a new diagnosis.  He has been treated with intermittent Lasix for fluid volume overload. He self extubated 10/16/2019 and was reintubated secondary to profound hypoxia and was then ultimately extubated to HFNC  10/21/2019. Course has been complicated secondary to sepsis with unclear etiology, currently being treated for MSSA and GBS bacteremia and presumed new systolic dysfunction.  Past Medical History:  Diagnosis Date  . Arthritis   . Asthma   . CHF (congestive heart failure) ([place]   .  Diabetes mellitus without complication ([place]   . Fibromyalgia   . Gout   . Hypertension   . IBS (irritable bowel syndrome)   . Vein disorder     Past Surgical History:  Procedure Laterality Date  . FOOT SURGERY       Prior to Admission medications   Medication Sig Start Date End Date Taking? Authorizing Provider  allopurinol (ZYLOPRIM) 300 MG tablet Take 300 mg by mouth daily.   Yes [provider]  buPROPion (WELLBUTRIN XL) 150 MG 24 hr tablet Take 150 mg by mouth every morning. 09/08/19  Yes [provider]  chlorthalidone (HYGROTON) 25 MG tablet Take 25 mg by mouth daily.   Yes [provider]  citalopram (CELEXA) 40 MG tablet Take 40 mg by mouth daily.   Yes [provider]  cyclobenzaprine (FLEXERIL) 10 MG tablet Take 10 mg by mouth 3 (three) times daily as needed for muscle spasms.   Yes [provider]  furosemide (LASIX) 20 MG tablet Take 20 mg by mouth.   Yes [provider]  glipiZIDE (GLUCOTROL) 10 MG tablet Take 10 mg by mouth daily before breakfast.   Yes [provider]  HYDROcodone-acetaminophen (NORCO/VICODIN) 5-325 MG tablet Take 1 tablet by mouth every 6 (six) hours as needed for moderate pain.   Yes [provider]  meloxicam (MOBIC) 15 MG tablet Take 15 mg by mouth daily.   Yes [provider]  montelukast (SINGULAIR) 10 MG tablet Take 10 mg by mouth at bedtime.   Yes [provider]  nitroGLYCERIN (NITRODUR - DOSED IN  MG/24 HR) 0.2 mg/hr patch Place 0.2 mg onto the skin daily.   Yes [provider]  nortriptyline (PAMELOR) 25 MG capsule Take 25 mg by mouth at bedtime.   Yes [provider]  pantoprazole (PROTONIX) 40 MG tablet Take 40 mg by mouth daily.   Yes [provider]  pioglitazone (ACTOS) 15 MG tablet Take 15 mg by mouth daily. 08/11/19  Yes [provider]  predniSONE (DELTASONE) 10 MG tablet Take 10 mg by mouth daily with breakfast.    Yes [provider]  verapamil (CALAN-SR) 240 MG CR tablet Take 240 mg by mouth at bedtime.   Yes [provider]  zolpidem (AMBIEN) 10 MG tablet Take 10 mg by mouth at bedtime as needed for sleep.   Yes [provider]  albuterol (PROVENTIL HFA;VENTOLIN HFA) 108 (90 Base) MCG/ACT inhaler Inhale into the lungs every 6 (six) hours as needed for wheezing or shortness of breath.    [provider]  Fluticasone Propionate, Inhal, (FLOVENT IN) Inhale into the lungs.    [provider]  ibuprofen (ADVIL) 600 MG tablet Take 600 mg by mouth every 6 (six) hours as needed (pain).  03/21/19   [provider]  lidocaine (LIDODERM) 5 % Place 1 patch onto the skin daily. Remove & Discard patch within 12 hours or as directed by MD 05/12/17   [name], PA-C  mometasone-formoterol (DULERA) 100-5 MCG/ACT AERO Inhale 2 puffs into the lungs 2 (two) times daily.    [provider]  traMADol (ULTRAM) 50 MG tablet Take 1 tablet (50 mg total) by mouth every 6 (six) hours as needed. Patient not taking: Reported on 10/12/2019 03/16/17   [name], MD    Inpatient Medications: Scheduled Meds: . chlorhexidine gluconate (MEDLINE KIT)  15 mL Mouth Rinse BID  . Chlorhexidine Gluconate Cloth  6 each Topical Daily  . docusate  100 mg Per Tube BID  . enoxaparin (LOVENOX) injection  60 mg Subcutaneous Daily  . famotidine  20 mg Per Tube BID  . feeding supplement (PROSource TF)  45 mL Per Tube BID  . insulin aspart  0-15 Units Subcutaneous Q4H  . insulin aspart  8 Units Subcutaneous Q4H  . insulin glargine  40 Units Subcutaneous BID  . linagliptin  5 mg Oral Daily  . living well with diabetes book   Does not apply Once  . metoprolol tartrate  12.5 mg Per Tube BID  . polyethylene glycol  17 g Per Tube Daily  . predniSONE  25 mg Per Tube Q breakfast   Followed by  . [START ON 10/25/2019] predniSONE  10 mg Per Tube Q breakfast  . sennosides  10 mL Per Tube  BID  . sodium chloride flush  10-40 mL Intracatheter Q12H   Continuous Infusions: . sodium chloride Stopped (10/12/19 1606)  . sodium chloride 10 mL/hr at 10/23/19 0900  .  ceFAZolin (ANCEF) IV 2 g (10/23/19 1416)  . feeding supplement (VITAL 1.5 CAL) 1,000 mL (10/23/19 1411)   PRN Meds: sodium chloride, sodium chloride, acetaminophen, bisacodyl, ipratropium-albuterol, oxyCODONE, sodium chloride flush  Allergies:    Allergies  Allergen Reactions  . Azithromycin   . Lodine [Etodolac] Anaphylaxis    Social History:   Social History   Socioeconomic History  . Marital status: Single    Spouse name: Not on file  . Number of children: Not on file  . Years of education: Not on file  . Highest education level: Not on  file  Occupational History  . Not on file  Tobacco Use  . Smoking status: Never Smoker  . Smokeless tobacco: Never Used  Vaping Use  . Vaping Use: Never used  Substance and Sexual Activity  . Alcohol use: Never  . Drug use: Never  . Sexual activity: Not on file  Other Topics Concern  . Not on file  Social History Narrative  . Not on file   Social Determinants of Health   Financial Resource Strain:   . Difficulty of Paying Living Expenses: Not on file  Food Insecurity:   . Worried About Charity fundraiser in the Last Year: Not on file  . Ran Out of Food in the Last Year: Not on file  Transportation Needs:   . Lack of Transportation (Medical): Not on file  . Lack of Transportation (Non-Medical): Not on file  Physical Activity:   . Days of Exercise per Week: Not on file  . Minutes of Exercise per Session: Not on file  Stress:   . Feeling of Stress : Not on file  Social Connections:   . Frequency of Communication with Friends and Family: Not on file  . Frequency of Social Gatherings with Friends and Family: Not on file  . Attends Religious Services: Not on file  . Active Member of Clubs or Organizations: Not on file  . Attends Archivist  Meetings: Not on file  . Marital Status: Not on file  Intimate Partner Violence:   . Fear of Current or Ex-Partner: Not on file  . Emotionally Abused: Not on file  . Physically Abused: Not on file  . Sexually Abused: Not on file    Family History:   No family history on file. Family Status:  No family status information on file.    ROS:  Please see the history of present illness.  All other ROS reviewed and negative.     Physical Exam/Data:   Vitals:   10/23/19 1100 10/23/19 1200 10/23/19 1300 10/23/19 1400  BP: 108/73 118/72 122/76 122/80  Pulse: (!) 129 (!) 123 (!) 123 (!) 119  Resp: (!) 34 (!) 21 19 (!) 29  Temp: 99.3 F (37.4 C) 98.9 F (37.2 C) 99.1 F (37.3 C) 98.8 F (37.1 C)  TempSrc: Oral  Oral Oral  SpO2: 97% 96% 96%   Weight:      Height:        Intake/Output Summary (Last 24 hours) at 10/23/2019 1530 Last data filed at 10/23/2019 0906 Gross per 24 hour  Intake 1219.54 ml  Output 1315 ml  Net -95.46 ml   Filed Weights   10/21/19 0500 10/22/19 0423 10/23/19 0346  Weight: 116.1 kg 116.4 kg 116.3 kg   Body mass index is 32.92 kg/m.   EKG:  The EKG was personally reviewed and demonstrates:  Sinus tachycardia.  Rate 133 bpm.  LVH with repolarization abnormalities. Telemetry:  Telemetry was personally reviewed and demonstrates:  Sinus tachycardia  Relevant CV Studies:  Echocardiogram 10/12/2019:  1. Extremely poor acoustic windows. LVEF appears to be depressed with  diffuse hypokinesis, worse in the mid/distal inferior/inferoseptal walls.  . Left ventricular ejection fraction, by estimation, is 30 to 35%. The  left ventricle has moderately  decreased function. The left ventricular internal cavity size was mildly  dilated. Left ventricular diastolic parameters are indeterminate.  2. Right ventricular systolic function is moderately reduced. The right  ventricular size is mildly enlarged.  3. The mitral valve is normal in structure. Trivial  mitral  valve  regurgitation.  4. The aortic valve is normal in structure. Aortic valve regurgitation is  not visualized.   Laboratory Data:  Chemistry Recent Labs  Lab 10/21/19 0458 10/22/19 0450 10/23/19 0352  NA 143 138 139  K 3.8 3.3* 3.9  CL 98 98 99  CO2 34* 30 28  GLUCOSE 203* 185* 217*  BUN 27* 19 21*  CREATININE 0.65 0.53* 0.66  CALCIUM 8.7* 8.1* 9.0  GFRNONAA >60 >60 >60  GFRAA >60 >60 >60  ANIONGAP 11 10 12     Total Protein  Date Value Ref Range Status  10/16/2019 6.0 (L) 6.5 - 8.1 g/dL Final   Albumin  Date Value Ref Range Status  10/16/2019 2.0 (L) 3.5 - 5.0 g/dL Final   AST  Date Value Ref Range Status  10/16/2019 73 (H) 15 - 41 U/L Final   ALT  Date Value Ref Range Status  10/16/2019 34 0 - 44 U/L Final   Alkaline Phosphatase  Date Value Ref Range Status  10/16/2019 118 38 - 126 U/L Final   Total Bilirubin  Date Value Ref Range Status  10/16/2019 0.8 0.3 - 1.2 mg/dL Final   Hematology Recent Labs  Lab 10/21/19 0458 10/22/19 0450 10/23/19 0352  WBC 15.3* 14.3* 18.7*  RBC 4.05* 3.90* 4.36  HGB 12.2* 11.6* 13.0  HCT 39.4 36.7* 40.9  MCV 97.3 94.1 93.8  MCH 30.1 29.7 29.8  MCHC 31.0 31.6 31.8  RDW 11.9 11.6 11.9  PLT 368 319 523*   Cardiac EnzymesNo results for input(s): TROPONINI in the last 168 hours. No results for input(s): TROPIPOC in the last 168 hours.  BNPNo results for input(s): BNP, PROBNP in the last 168 hours.  DDimer  Recent Labs  Lab 10/23/19 1058  DDIMER 5.19*   TSH:  Lab Results  Component Value Date   TSH 1.422 10/23/2019   Lipids: Lab Results  Component Value Date   TRIG 162 (H) 10/19/2019   HgbA1c: Lab Results  Component Value Date   HGBA1C 11.6 (H) 10/10/2019    Radiology/Studies:  DG Abd Portable 1V  Result Date: 10/20/2019 CLINICAL DATA:  GI problem. EXAM: PORTABLE ABDOMEN - 1 VIEW COMPARISON:  10/16/2019 FINDINGS: Feeding tube tip is in the distal stomach or proximal duodenum. Mild gaseous  distention of the stomach. Gas throughout nondistended large and small bowel. No evidence of bowel obstruction, organomegaly or free air. IMPRESSION: Feeding tube tip in the distal stomach or proximal duodenum. No evidence of obstruction or free air. Electronically Signed   By: Rolm Baptise M.D.   On: 10/20/2019 21:28   DG Foot 2 Views Left  Result Date: 10/23/2019 CLINICAL DATA:  Bacteremia, LEFT foot pain and diabetic. EXAM: LEFT FOOT - 2 VIEW COMPARISON:  10/13/2019 and prior radiographs FINDINGS: Mildly angulated fractures of the 4th and 5th metatarsals are again noted with fracture lines still evident. Healing changes along these fractures are noted. Overlying soft tissue swelling is noted. No new fracture, subluxation or dislocation noted. No definite radiographic evidence of acute osteomyelitis noted. IMPRESSION: Mildly angulated healing fractures of the 4th and 5th metatarsals with soft tissue swelling again noted but without definite radiographic evidence of acute osteomyelitis. Consider MRI for further evaluation as clinically indicated. Electronically Signed   By: Margarette Canada M.D.   On: 10/23/2019 13:40   Assessment and Plan:   1.  Acute systolic CHF: -Patient presented to [facility] ED on 10/09/2019 with a 1 week history of cough, congestion, myalgias  and worsening shortness of breath found to be Covid positive on ED evaluation. He was treated with Decadron 92m x1 on 8/16, remdesivir, vancomycin, ceftriaxone, insulin, potassium supplementation, magnesium supplementation, and IVF. Blood cultures found to be positive for Streptococcus and Staphylococcus with elevated lactic acid.  -Unfortunately, patient decompensated and was transferred to ICU for ETT intubation. -Echocardiogram performed 10/12/2019 with an LVEF at 30 to 35% with indeterminate LV diastolic parameters with no prior study for comparison.  Unclear if this is a new diagnosis>>>treated with intermittent Lasix for fluid volume  overload.  -IV Lasix held today per primary team given rising lactic acid from 1.1>> to 2.3 -Procalcitonin 1853>> 250 mL NS bolus given -Currently being treated with metoprolol tartrate 12.5 mg twice daily.  Can consider ARB/ARNI once stabilized from acute infection -Will likely need further ischemic work-up once stabilized from acute illness -See plan for #3>>> TEE for the evaluation of potential myocarditis given rising lactic acid, COVID-19 and bacteremia  2.  ARDS secondary to COVID-19 infection: -Extubated to high flow nasal cannula>> stabilizing -Plan per primary team/ID for continuation of steroid taper -Completed course of remdesivir  3.  Sepsis, likely secondary to left diabetic foot wound: -Blood cultures on presentation found to be positive for MSSA/GBS bacteremia treated with Ancef -Lactic acidosis with most current lactic acid elevated at 2.3 with a procalcitonin at 1853  Other acute issues per primary team include: -Acute metabolic encephalopathy -Left diabetic foot wound -DM2   For questions or updates, please contact [place] consult [email or web address] for contact info under Cardiology/STEMI.   SLyndel SafeNP-C [facility] Pager: [phone]/30/2021 3:30 PM   Attending addendum:  History and all data above reviewed.  Patient examined.  I agree with the findings as above.  The patient exam reveals:  Gen: Ill-appearing.  Not answering questions.  Neck: No JVD COR:  Tachycardic.  Regular rhythm.  No m/r/ Lungs:  CTAB. No crackles, wheezes or rhonchi Abd: Central adiposity.   Ext: R foot dusky.  Unable to palpate pedal pulses    All available labs, radiology testing, previous records reviewed. Agree with documented assessment and plan. Micheal Bell a 31M with obesity, diabetes and hypertension admitted with COVID-19/MSSA and Strep PNA.  His hospital course has been complicated by likely embolism to the R foot.  There is a remote CVA and echo  reveals LVEF 30-35% with no prior for comparison.  Mr. RMilhornis not answering any questions or speaking at the time of his interview.  Agree that a TEE is indicated for source of embolism and MSSA/Strep.  He is very tachycardic which is likely 2/2 sepsis as well as heart failure.  Will switch metoprolol to succinate.  Plan to add ARB as blood pressure allows.    Spok with the patient's mother and sister.  They report that they have a strong history of CAD and that he often reports that his heart hurts.  He hasn't seen a doctor because of lack of insurance and cost.  Chest CT-A today revealed coronary atherosclerosis.  He will need LHC once his acute illness has resolved.   Jacinta Penalver C. ROval Linsey MD, [facility] 10/23/2019 5:46 PM

## 2019-10-23 NOTE — Progress Notes (Signed)
   10/23/19 1300  Assess: MEWS Score  Temp 99.1 F (37.3 C)  BP 122/76  Pulse Rate (!) 123  ECG Heart Rate (!) 123  Resp 19  Level of Consciousness Alert  SpO2 96 %  O2 Device Nasal Cannula  Patient Activity (if Appropriate) In bed  O2 Flow Rate (L/min) 2 L/min  Assess: MEWS Score  MEWS Temp 0  MEWS Systolic 0  MEWS Pulse 2  MEWS RR 0  MEWS LOC 0  MEWS Score 2  MEWS Score Color Yellow  Assess: if the MEWS score is Yellow or Red  Were vital signs taken at a resting state? Yes  Focused Assessment No change from prior assessment  Early Detection of Sepsis Score *See Row Information* Low  MEWS guidelines implemented *See Row Information* Yes  Patient feeding started tolerating well, denied pain and no sob noted however shallow breathing evident. Patient Cta pending. Will continue to monitor

## 2019-10-23 NOTE — Progress Notes (Signed)
°   10/23/19 1700  Assess: MEWS Score  Temp 98.9 F (37.2 C)  BP 125/78  Pulse Rate (!) 124  ECG Heart Rate (!) 124  Resp (!) 33  Level of Consciousness Alert  SpO2 96 %  O2 Device Nasal Cannula  O2 Flow Rate (L/min) 2 L/min  Assess: MEWS Score  MEWS Temp 0  MEWS Systolic 0  MEWS Pulse 2  MEWS RR 2  MEWS LOC 0  MEWS Score 4  MEWS Score Color Red  Assess: if the MEWS score is Yellow or Red  Were vital signs taken at a resting state? Yes  Focused Assessment No change from prior assessment  Early Detection of Sepsis Score *See Row Information* Low  MEWS guidelines implemented *See Row Information* Yes  Patient episode of large urine incontinence and patient heart ranged from 111-124. Patient tolerating tube feeding and new orders noted in chart for increase in beta blocker beginning 10/23/19

## 2019-10-23 NOTE — Consult Note (Signed)
Cardiology Consultation:   Patient ID: Micheal Bell; [id number]; 1970/04/11   Admit date: 10/09/2019 Date of Consult: 10/23/2019  Primary Care Provider: [name], MD Primary Cardiologist: New to [facility]   Patient Profile:   Micheal Bell is a 49 y.o. male with a hx of DM2, asthma, history of DVT, OSA not on CPAP who is being seen today for the evaluation of acute CHF at the request of Dr. Florene Glen.  History of Present Illness:   Micheal Bell is a 49 year old male with a history of type 2 diabetes who initially presented to [facility] ED on 10/09/2019 with a 1 week history of cough, congestion, myalgias and worsening shortness of breath found to be Covid positive on ED evaluation. He was given Decadron 42m x1 on 8/16, remdesivir, vancomycin, ceftriaxone, insulin, potassium supplementation, magnesium supplementation, and IVF. Blood cultures found to be positive for Streptococcus and Staphylococcus with elevated lactic acid.  [facility] Ultimately, there were no beds available therefore he was held and treated until transfer 10/12/2019.   [place] requiring HFNC 100% and subsequently needed transfer to ICU for ETT intubation. Echocardiogram performed 10/12/2019 with an LVEF at 30 to 35% with indeterminate LV diastolic parameters with no prior study for comparison.  Unclear if this is a new diagnosis.  He has been treated with intermittent Lasix for fluid volume overload. He self extubated 10/16/2019 and was reintubated secondary to profound hypoxia and was then ultimately extubated to HFNC  10/21/2019. Course has been complicated secondary to sepsis with unclear etiology, currently being treated for MSSA and GBS bacteremia and presumed new systolic dysfunction.  Past Medical History:  Diagnosis Date  . Arthritis   . Asthma   . CH[place] ([place]estive heart failure) ([place]   .  Diabetes mellitus without complication ([place]   . Fibromyalgia   . Gout   . Hypertension   . IBS (irritable bowel syndrome)   . Vein disorder     Past Surgical History:  Procedure Laterality Date  . FOOT SURGERY       Prior to Admission medications   Medication Sig Start Date End Date Taking? Authorizing Provider  allopurinol (ZYLOPRIM) 300 MG tablet Take 300 mg by mouth daily.   Yes [provider]  buPROPion (WELLBUTRIN XL) 150 MG 24 hr tablet Take 150 mg by mouth every morning. 09/08/19  Yes [provider]  chlorthalidone (HYGROTON) 25 MG tablet Take 25 mg by mouth daily.   Yes [provider]  citalopram (CELEXA) 40 MG tablet Take 40 mg by mouth daily.   Yes [provider]  cyclobenzaprine (FLEXERIL) 10 MG tablet Take 10 mg by mouth 3 (three) times daily as needed for muscle spasms.   Yes [provider]  furosemide (LASIX) 20 MG tablet Take 20 mg by mouth.   Yes [provider]  glipiZIDE (GLUCOTROL) 10 MG tablet Take 10 mg by mouth daily before breakfast.   Yes [provider]  HYDROcodone-acetaminophen (NORCO/VICODIN) 5-325 MG tablet Take 1 tablet by mouth every 6 (six) hours as needed for moderate pain.   Yes [provider]  meloxicam (MOBIC) 15 MG tablet Take 15 mg by mouth daily.   Yes [provider]  montelukast (SINGULAIR) 10 MG tablet Take 10 mg by mouth at bedtime.   Yes [provider]  nitroGLYCERIN (NITRODUR - DOSED IN  MG/24 HR) 0.2 mg/hr patch Place 0.2 mg onto the skin daily.   Yes [provider]  nortriptyline (PAMELOR) 25 MG capsule Take 25 mg by mouth at bedtime.   Yes [provider]  pantoprazole (PROTONIX) 40 MG tablet Take 40 mg by mouth daily.   Yes [provider]  pioglitazone (ACTOS) 15 MG tablet Take 15 mg by mouth daily. 08/11/19  Yes [provider]  predniSONE (DELTASONE) 10 MG tablet Take 10 mg by mouth daily with breakfast.    Yes [provider]  verapamil (CALAN-SR) 240 MG CR tablet Take 240 mg by mouth at bedtime.   Yes [provider]  zolpidem (AMBIEN) 10 MG tablet Take 10 mg by mouth at bedtime as needed for sleep.   Yes [provider]  albuterol (PROVENTIL HFA;VENTOLIN HFA) 108 (90 Base) MCG/ACT inhaler Inhale into the lungs every 6 (six) hours as needed for wheezing or shortness of breath.    [provider]  Fluticasone Propionate, Inhal, (FLOVENT IN) Inhale into the lungs.    [provider]  ibuprofen (ADVIL) 600 MG tablet Take 600 mg by mouth every 6 (six) hours as needed (pain).  03/21/19   [provider]  lidocaine (LIDODERM) 5 % Place 1 patch onto the skin daily. Remove & Discard patch within 12 hours or as directed by MD 05/12/17   [name], PA-C  mometasone-formoterol (DULERA) 100-5 MCG/ACT AERO Inhale 2 puffs into the lungs 2 (two) times daily.    [provider]  traMADol (ULTRAM) 50 MG tablet Take 1 tablet (50 mg total) by mouth every 6 (six) hours as needed. Patient not taking: Reported on 10/12/2019 03/16/17   [name], MD    Inpatient Medications: Scheduled Meds: . chlorhexidine gluconate (MEDLINE KIT)  15 mL Mouth Rinse BID  . Chlorhexidine Gluconate Cloth  6 each Topical Daily  . docusate  100 mg Per Tube BID  . enoxaparin (LOVENOX) injection  60 mg Subcutaneous Daily  . famotidine  20 mg Per Tube BID  . feeding supplement (PROSource TF)  45 mL Per Tube BID  . insulin aspart  0-15 Units Subcutaneous Q4H  . insulin aspart  8 Units Subcutaneous Q4H  . insulin glargine  40 Units Subcutaneous BID  . linagliptin  5 mg Oral Daily  . living well with diabetes book   Does not apply Once  . metoprolol tartrate  12.5 mg Per Tube BID  . polyethylene glycol  17 g Per Tube Daily  . predniSONE  25 mg Per Tube Q breakfast   Followed by  . [START ON 10/25/2019] predniSONE  10 mg Per Tube Q breakfast  . sennosides  10 mL Per Tube  BID  . sodium chloride flush  10-40 mL Intracatheter Q12H   Continuous Infusions: . sodium chloride Stopped (10/12/19 1606)  . sodium chloride 10 mL/hr at 10/23/19 0900  .  ceFAZolin (ANCEF) IV 2 g (10/23/19 1416)  . feeding supplement (VITAL 1.5 CAL) 1,000 mL (10/23/19 1411)   PRN Meds: sodium chloride, sodium chloride, acetaminophen, bisacodyl, ipratropium-albuterol, oxyCODONE, sodium chloride flush  Allergies:    Allergies  Allergen Reactions  . Azithromycin   . Lodine [Etodolac] Anaphylaxis    Social History:   Social History   Socioeconomic History  . Marital status: Single    Spouse name: Not on file  . Number of children: Not on file  . Years of education: Not on file  . Highest education level: Not on  file  Occupational History  . Not on file  Tobacco Use  . Smoking status: Never Smoker  . Smokeless tobacco: Never Used  Vaping Use  . Vaping Use: Never used  Substance and Sexual Activity  . Alcohol use: Never  . Drug use: Never  . Sexual activity: Not on file  Other Topics Concern  . Not on file  Social History Narrative  . Not on file   Social Determinants of Health   Financial Resource Strain:   . Difficulty of Paying Living Expenses: Not on file  Food Insecurity:   . Worried About Charity fundraiser in the Last Year: Not on file  . Ran Out of Food in the Last Year: Not on file  Transportation Needs:   . Lack of Transportation (Medical): Not on file  . Lack of Transportation (Non-Medical): Not on file  Physical Activity:   . Days of Exercise per Week: Not on file  . Minutes of Exercise per Session: Not on file  Stress:   . Feeling of Stress : Not on file  Social Connections:   . Frequency of Communication with Friends and Family: Not on file  . Frequency of Social Gatherings with Friends and Family: Not on file  . Attends Religious Services: Not on file  . Active Member of Clubs or Organizations: Not on file  . Attends Archivist  Meetings: Not on file  . Marital Status: Not on file  Intimate Partner Violence:   . Fear of Current or Ex-Partner: Not on file  . Emotionally Abused: Not on file  . Physically Abused: Not on file  . Sexually Abused: Not on file    Family History:   No family history on file. Family Status:  No family status information on file.    ROS:  Please see the history of present illness.  All other ROS reviewed and negative.     Physical Exam/Data:   Vitals:   10/23/19 1100 10/23/19 1200 10/23/19 1300 10/23/19 1400  BP: 108/73 118/72 122/76 122/80  Pulse: (!) 129 (!) 123 (!) 123 (!) 119  Resp: (!) 34 (!) 21 19 (!) 29  Temp: 99.3 F (37.4 C) 98.9 F (37.2 C) 99.1 F (37.3 C) 98.8 F (37.1 C)  TempSrc: Oral  Oral Oral  SpO2: 97% 96% 96%   Weight:      Height:        Intake/Output Summary (Last 24 hours) at 10/23/2019 1530 Last data filed at 10/23/2019 0906 Gross per 24 hour  Intake 1219.54 ml  Output 1315 ml  Net -95.46 ml   Filed Weights   10/21/19 0500 10/22/19 0423 10/23/19 0346  Weight: 116.1 kg 116.4 kg 116.3 kg   Body mass index is 32.92 kg/m.   EKG:  The EKG was personally reviewed and demonstrates:  Sinus tachycardia.  Rate 133 bpm.  LVH with repolarization abnormalities. Telemetry:  Telemetry was personally reviewed and demonstrates:  Sinus tachycardia  Relevant CV Studies:  Echocardiogram 10/12/2019:  1. Extremely poor acoustic windows. LVEF appears to be depressed with  diffuse hypokinesis, worse in the mid/distal inferior/inferoseptal walls.  . Left ventricular ejection fraction, by estimation, is 30 to 35%. The  left ventricle has moderately  decreased function. The left ventricular internal cavity size was mildly  dilated. Left ventricular diastolic parameters are indeterminate.  2. Right ventricular systolic function is moderately reduced. The right  ventricular size is mildly enlarged.  3. The mitral valve is normal in structure. Trivial  mitral  valve  regurgitation.  4. The aortic valve is normal in structure. Aortic valve regurgitation is  not visualized.   Laboratory Data:  Chemistry Recent Labs  Lab 10/21/19 0458 10/22/19 0450 10/23/19 0352  NA 143 138 139  K 3.8 3.3* 3.9  CL 98 98 99  CO2 34* 30 28  GLUCOSE 203* 185* 217*  BUN 27* 19 21*  CREATININE 0.65 0.53* 0.66  CALCIUM 8.7* 8.1* 9.0  GFRNONAA >60 >60 >60  GFRAA >60 >60 >60  ANIONGAP 11 10 12     Total Protein  Date Value Ref Range Status  10/16/2019 6.0 (L) 6.5 - 8.1 g/dL Final   Albumin  Date Value Ref Range Status  10/16/2019 2.0 (L) 3.5 - 5.0 g/dL Final   AST  Date Value Ref Range Status  10/16/2019 73 (H) 15 - 41 U/L Final   ALT  Date Value Ref Range Status  10/16/2019 34 0 - 44 U/L Final   Alkaline Phosphatase  Date Value Ref Range Status  10/16/2019 118 38 - 126 U/L Final   Total Bilirubin  Date Value Ref Range Status  10/16/2019 0.8 0.3 - 1.2 mg/dL Final   Hematology Recent Labs  Lab 10/21/19 0458 10/22/19 0450 10/23/19 0352  WBC 15.3* 14.3* 18.7*  RBC 4.05* 3.90* 4.36  HGB 12.2* 11.6* 13.0  HCT 39.4 36.7* 40.9  MCV 97.3 94.1 93.8  MCH 30.1 29.7 29.8  MCHC 31.0 31.6 31.8  RDW 11.9 11.6 11.9  PLT 368 319 523*   Cardiac EnzymesNo results for input(s): TROPONINI in the last 168 hours. No results for input(s): TROPIPOC in the last 168 hours.  BNPNo results for input(s): BNP, PROBNP in the last 168 hours.  DDimer  Recent Labs  Lab 10/23/19 1058  DDIMER 5.19*   TSH:  Lab Results  Component Value Date   TSH 1.422 10/23/2019   Lipids: Lab Results  Component Value Date   TRIG 162 (H) 10/19/2019   HgbA1c: Lab Results  Component Value Date   HGBA1C 11.6 (H) 10/10/2019    Radiology/Studies:  DG Abd Portable 1V  Result Date: 10/20/2019 CLINICAL DATA:  GI problem. EXAM: PORTABLE ABDOMEN - 1 VIEW COMPARISON:  10/16/2019 FINDINGS: Feeding tube tip is in the distal stomach or proximal duodenum. Mild gaseous  distention of the stomach. Gas throughout nondistended large and small bowel. No evidence of bowel obstruction, organomegaly or free air. IMPRESSION: Feeding tube tip in the distal stomach or proximal duodenum. No evidence of obstruction or free air. Electronically Signed   By: Rolm Baptise M.D.   On: 10/20/2019 21:28   DG Foot 2 Views Left  Result Date: 10/23/2019 CLINICAL DATA:  Bacteremia, LEFT foot pain and diabetic. EXAM: LEFT FOOT - 2 VIEW COMPARISON:  10/13/2019 and prior radiographs FINDINGS: Mildly angulated fractures of the 4th and 5th metatarsals are again noted with fracture lines still evident. Healing changes along these fractures are noted. Overlying soft tissue swelling is noted. No new fracture, subluxation or dislocation noted. No definite radiographic evidence of acute osteomyelitis noted. IMPRESSION: Mildly angulated healing fractures of the 4th and 5th metatarsals with soft tissue swelling again noted but without definite radiographic evidence of acute osteomyelitis. Consider MRI for further evaluation as clinically indicated. Electronically Signed   By: Margarette Canada M.D.   On: 10/23/2019 13:40   Assessment and Plan:   1.  Acute systolic CHF: -Patient presented to [facility] ED on 10/09/2019 with a 1 week history of cough, congestion, myalgias  and worsening shortness of breath found to be Covid positive on ED evaluation. He was treated with Decadron 13m x1 on 8/16, remdesivir, vancomycin, ceftriaxone, insulin, potassium supplementation, magnesium supplementation, and IVF. Blood cultures found to be positive for Streptococcus and Staphylococcus with elevated lactic acid.  -Unfortunately, patient decompensated and was transferred to ICU for ETT intubation. -Echocardiogram performed 10/12/2019 with an LVEF at 30 to 35% with indeterminate LV diastolic parameters with no prior study for comparison.  Unclear if this is a new diagnosis>>>treated with intermittent Lasix for fluid volume  overload.  -IV Lasix held today per primary team given rising lactic acid from 1.1>> to 2.3 -Procalcitonin 1853>> 250 mL NS bolus given -Currently being treated with metoprolol tartrate 12.5 mg twice daily.  Can consider ARB/ARNI once stabilized from acute infection -Will likely need further ischemic work-up once stabilized from acute illness -See plan for #3>>> TEE for the evaluation of potential myocarditis given rising lactic acid, COVID-19 and bacteremia  2.  ARDS secondary to COVID-19 infection: -Extubated to high flow nasal cannula>> stabilizing -Plan per primary team/ID for continuation of steroid taper -Completed course of remdesivir  3.  Sepsis, likely secondary to left diabetic foot wound: -Blood cultures on presentation found to be positive for MSSA/GBS bacteremia treated with Ancef -Lactic acidosis with most current lactic acid elevated at 2.3 with a procalcitonin at 1853  Other acute issues per primary team include: -Acute metabolic encephalopathy -Left diabetic foot wound -DM2   For questions or updates, please contact CCanovaPlease consult [email or web address] for contact info under Cardiology/STEMI.   SLyndel SafeNP-C [facility] Pager: [phone]/30/2021 3:30 PM   Attending addendum:  History and all data above reviewed.  Patient examined.  I agree with the findings as above.  The patient exam reveals:  Gen: Ill-appearing.  Not answering questions.  Neck: No JVD COR:  Tachycardic.  Regular rhythm.  No m/r/ Lungs:  CTAB. No crackles, wheezes or rhonchi Abd: Central adiposity.   Ext: R foot dusky.  Unable to palpate pedal pulses    All available labs, radiology testing, previous records reviewed. Agree with documented assessment and plan. Mr. RSarvisis a 14M with obesity, diabetes and hypertension admitted with COVID-19/MSSA and Strep PNA.  His hospital course has been complicated by likely embolism to the R foot.  There is a remote CVA and echo  reveals LVEF 30-35% with no prior for comparison.  Mr. RManeis not answering any questions or speaking at the time of his interview.  Agree that a TEE is indicated for source of embolism and MSSA/Strep.  He is very tachycardic which is likely 2/2 sepsis as well as heart failure.  Will switch metoprolol to succinate.  Plan to add ARB as blood pressure allows.    Spok with the patient's mother and sister.  They report that they have a strong history of CAD and that he often reports that his heart hurts.  He hasn't seen a doctor because of lack of insurance and cost.  Chest CT-A today revealed coronary atherosclerosis.  He will need LHC once his acute illness has resolved.   Pedro Whiters C. ROval Linsey MD, [facility] 10/23/2019 5:46 PM

## 2019-10-23 NOTE — Progress Notes (Signed)
CRITICAL VALUE ALERT  Critical Value:  Lactic Acid 2.3  Date & Time Notied:  10/23/2019 1303  Provider Notified: Dr. Lowell Guitar paged   Orders Received/Actions taken: see new orders

## 2019-10-23 NOTE — Progress Notes (Signed)
Called Elink about patient's HR 125-130s and RR 25-30. No new orders received. Will continue to monitor.

## 2019-10-23 NOTE — Progress Notes (Signed)
Notified bedside nurse of need to draw repeat lactic acid again.

## 2019-10-23 NOTE — Progress Notes (Signed)
Inpatient Diabetes Program Recommendations  AACE/ADA: New Consensus Statement on Inpatient Glycemic Control (2015)  Target Ranges:  Prepandial:   less than 140 mg/dL      Peak postprandial:   less than 180 mg/dL (1-2 hours)      Critically ill patients:  140 - 180 mg/dL   Lab Results  Component Value Date   GLUCAP 292 (H) 10/23/2019   HGBA1C 11.6 (H) 10/10/2019    Review of Glycemic Control Results for Micheal Bell, Micheal Bell (MRN [id number]) as of 10/23/2019 12:59  Ref. Range 10/22/2019 23:50 10/23/2019 03:40 10/23/2019 07:57 10/23/2019 10:12 10/23/2019 11:41  Glucose-Capillary Latest Ref Range: 70 - 99 mg/dL 001 (H) 749 (H) 449 (H) 317 (H) 292 (H)   Diabetes history: DM 2 Outpatient Diabetes medications: Glipizide 10 mg Daily, Actos 15 mg Daily Current orders for Inpatient glycemic control:  Lantus 40 units bid Tradjenta 5 mg Daily Novolog 0-15 Q4 hours Novolog 8 units Q4  Vital AF 70 ml/hour PO prednisone 25 mg Daily decreased to 10 mg daily starting tomorrow 9/1  Inpatient Diabetes Program Recommendations:    Pt with medical issues going on. Per Nurse charting only oriented to person today. A1c 11.6% will continue to follow and discuss with pt when appropriate closer to d/c. Maybe new to insulin at time of d/c. Noted no insurance listed in chart. Dr. Benedetto Goad listed as PCP.  Thanks,  Christena Deem RN, MSN, BC-ADM Inpatient Diabetes Coordinator Team Pager [phone] (8a-5p)

## 2019-10-23 NOTE — Progress Notes (Signed)
°   10/23/19 1200  Assess: MEWS Score  Temp 98.9 F (37.2 C)  BP 118/72  Pulse Rate (!) 123  ECG Heart Rate (!) 123  Resp (!) 21  Level of Consciousness Alert  SpO2 96 %  O2 Device Nasal Cannula  Patient Activity (if Appropriate) In bed  O2 Flow Rate (L/min) 2 L/min  Assess: MEWS Score  MEWS Temp 0  MEWS Systolic 0  MEWS Pulse 2  MEWS RR 1  MEWS LOC 0  MEWS Score 3  MEWS Score Color Yellow  Assess: if the MEWS score is Yellow or Red  Were vital signs taken at a resting state? Yes  Focused Assessment No change from prior assessment  Early Detection of Sepsis Score *See Row Information* Low  MEWS guidelines implemented *See Row Information* Yes  Lactic acid resulted remote monitoring started, additional 500 cc LR bolus given per md and patient still sustaining at 120s heart rate and respirations between 22-30. Patient oxygen increased to 2 liters, feet elevated on pillows foley catheter removed and u/a sent to lab. Will continue to monitor

## 2019-10-23 NOTE — Progress Notes (Signed)
   10/23/19 1600  Assess: MEWS Score  Temp 98.9 F (37.2 C)  BP 127/76  Pulse Rate (!) 126  ECG Heart Rate (!) 121  Resp (!) 29  Level of Consciousness Alert  SpO2 96 %  O2 Device Nasal Cannula  O2 Flow Rate (L/min) 2 L/min  Assess: MEWS Score  MEWS Temp 0  MEWS Systolic 0  MEWS Pulse 2  MEWS RR 2  MEWS LOC 0  MEWS Score 4  MEWS Score Color Red  Assess: if the MEWS score is Yellow or Red  Were vital signs taken at a resting state? Yes  Focused Assessment No change from prior assessment  Early Detection of Sepsis Score *See Row Information* Low  MEWS guidelines implemented *See Row Information* Yes  IV stat order placed for lactic acid nurse reached out to the iv team and notified supervisor and manager of delay in access. Was informed lab would be drawn shortly, patient oral suction continued. No SOB noted will continue to monitor

## 2019-10-23 NOTE — Progress Notes (Signed)
eLink Physician-Brief Progress Note Patient Name: Micheal Bell DOB: 02/28/1970 MRN: [id number]   Date of Service  10/23/2019  HPI/Events of Note  Called regarding HR 120s-130s. Sinus tach. Patient examined on camera. Appears comfortable albeit slightly anxious. Afebrile. SBP 130s.   eICU Interventions  No changes to management indicated at this time. Continue to monitor.     Intervention Category Intermediate Interventions: Arrhythmia - evaluation and management  [name] 10/23/2019, 5:10 AM

## 2019-10-23 NOTE — Progress Notes (Signed)
Code sepsis called 1119, antibiotics given  @ 0501, blood cultures drawn @ 1058

## 2019-10-23 NOTE — Progress Notes (Signed)
    [facility]   Reason for visit: Follow up on bacteremia  Interval History: now in 5W.  Some mild fever to 100.7, WBC 18.7.  He does not answer questions.  Repeat blood cultures from 8/19 remained negative.     Physical Exam: Constitutional:  Vitals:   10/23/19 0904 10/23/19 1000  BP: (!) 126/94 129/85  Pulse: (!) 128 (!) 131  Resp:  (!) 35  Temp:  99 F (37.2 C)  SpO2:  92%   patient appears in NAD Respiratory: Normal respiratory effort; CTA B Cardiovascular: RRR GI: soft, nt, nd MS: right foot is cold, dusky appearance on all toes on the dorsal side; left foot with open ulcer, does not appear to extend to bone.    Review of Systems: Unable to be assessed due to mental status  Lab Results  Component Value Date   WBC 18.7 (H) 10/23/2019   HGB 13.0 10/23/2019   HCT 40.9 10/23/2019   MCV 93.8 10/23/2019   PLT 523 (H) 10/23/2019    Lab Results  Component Value Date   CREATININE 0.66 10/23/2019   BUN 21 (H) 10/23/2019   NA 139 10/23/2019   K 3.9 10/23/2019   CL 99 10/23/2019   CO2 28 10/23/2019    Lab Results  Component Value Date   ALT 34 10/16/2019   AST 73 (H) 10/16/2019   ALKPHOS 118 10/16/2019     Microbiology: Recent Results (from the past 240 hour(s))  MRSA PCR Screening     Status: None   Collection Time: 10/17/19  8:51 AM   Specimen: Nasal Mucosa; Nasopharyngeal  Result Value Ref Range Status   MRSA by PCR NEGATIVE NEGATIVE Final    Comment:        The GeneXpert MRSA Assay (FDA approved for NASAL specimens only), is one component of a comprehensive MRSA colonization surveillance program. It is not intended to diagnose MRSA infection nor to guide or monitor treatment for MRSA infections. Performed at [facility], [address] [place], [place] [address]     Impression/Plan:  1. Bacteremia - did have MSSA and group B strep bacteremia and has been on cefazolin.  TTE done with poor windows.   I do  recommend a TEE for ? Infective endocarditis.   2.  Left foot ulcer - swab culture with MSSA c/w his bacteremia and likely a source. Could repeat the foot xray to see if there is any progression or concern for osteomyelitis and if any debridement is indicated.  Also ABI for ? Blood flow for healing/wound care.    3.  Cold extremity - blue toes of right foot.  ? Embolic disease/IE.  ABI with good arterial blood flow.   As in #1, TEE for ? Left sided endocarditis.    4.  Infarct - remote finding on recent head CT.

## 2019-10-23 NOTE — Progress Notes (Signed)
°   10/23/19 1000  Assess: MEWS Score  Temp 99 F (37.2 C)  BP 129/85  Pulse Rate (!) 131  ECG Heart Rate (!) 128  Resp (!) 35  Level of Consciousness Alert  SpO2 92 %  O2 Device Nasal Cannula  O2 Flow Rate (L/min) 1 L/min  Assess: MEWS Score  MEWS Temp 0  MEWS Systolic 0  MEWS Pulse 2  MEWS RR 2  MEWS LOC 0  MEWS Score 4  MEWS Score Color Red  Assess: if the MEWS score is Yellow or Red  Were vital signs taken at a resting state? Yes  Focused Assessment No change from prior assessment  Early Detection of Sepsis Score *See Row Information* High  MEWS guidelines implemented *See Row Information* Yes  Patient received on unit MEWs 5, with st 125-130 sustaining. RR 35. Patient Axox2-3, feedtube in place, right lower foot purplish with weak pulse, left leg normal color with weak pulse. Patient denied pain responding by head motion and hand gestures. Patient required suctioning of white frothy secretions. Patient placed on 1 liter of oxygen and MD notified of findings. MD stated he would come to  Unit a assess patient.

## 2019-10-23 NOTE — Progress Notes (Signed)
Notified bedside nurse of need to draw repeat lactic acid. 

## 2019-10-23 NOTE — Progress Notes (Signed)
Per hospital policy notified iv team of lactic acid was informed team was behind schedule. Notif

## 2019-10-23 NOTE — Progress Notes (Addendum)
PROGRESS NOTE    Micheal Bell  GBE:[id number] DOB: 09-05-1970 DOA: 10/09/2019 PCP: [name], MD   Chief Complaint  Patient presents with  . Cough    Brief Narrative:  49 yo male with hx of HTN, T2DM, gout, IBS, fibromyalgia, obesity who presented on 8/16 for COVID pneumonia.  He decompensated on 8/19 and was intubated.  Self extubated on 8/23 and was reintubated.  He's subsequently been extubated on 8/28.  He's been on typical therapies for covid including steroids, baricitinib and remdesivir.  His hospitalization was complicated by MSSA and GBS bacteria.  Also found to have systolic heart failure.  Transferred to hospitalist service on 8/30.    Assessment & Plan:   Active Problems:   COVID-19 virus infection   Hyperglycemia   Hypoxia  Sepsis with unclear source: tachycardia and tachypnea appear to have worsened in past 24 hrs or so with fever x 2.  Leukocytosis is worsening, he continues on Eugean Arnott steroid taper.  Currently being treated for GBS bacteremia and MSSA bacteremia with ancef.    Code sepsis ordered Continue current abx with ancef Follow repeat blood cx, UA and urine culture (will discontinue indwelling foley catheter) Follow repeat procalcitonin, lactic acid Follow 250 cc bolus with hx of new HF and follow EKG Also on ddx would be VTE with dx of covid and elevated d dimer earlier in admission, follow LE [place] and CT PE protocol  Appreciate ID assistance, TEE with hx bacteremia? He has R arm PICC  Acute Hypoxic Respiratory Failure 2/2 COVID Pneumonia:  Intubated 8/19, extubated 8/28 Positive test on 10/09/2019 Remdesivir 8/16 - 8/21 Currently on steroid taper Baricitinib 8/19 - present --- with above and known bacteremia and improvement from resp standpoint, will discuss discontinuation with ID Continue to monitor IS, daily weights  COVID-19 Labs  No results for input(s): DDIMER, FERRITIN, LDH, CRP in the last 72 hours.  Lab Results  Component Value Date    SARSCOV2NAA POSITIVE (Micheal Bell) 10/09/2019   MSSA Bacteremia  GBS Bacteremia  L diabetic foot wound:  Appreciate ID assistance Vancomycin 8/17 - 8/19, ceftriaxone 8/17-18 Ancef 8/19 - present Follow ABI on left  Consider MRI once stable Consult cards for TEE  HFrEF with Exacerbation: new diagnosis? I don't see prior echo's in our system or care everywhere. Echo 8/19 with EF 30-35% with diffuse hypokinesis.  RV systolic function moderately reduced. (see report) With concern for sepsis above, hold lasix today, will give 250 cc bolus and follow response With new diagnosis and possible need for TEE with bacteremia above, will consult cardiology  Continue beta blocker, hold on ace/arb/arni with concern for sepsis above  Acute Metabolic Encephalopathy: suspect ICU/hospital delirium + COVID infection.  Unclear to me what his baseline is.  Currently he's alert, but only says 1 or 2 words.  Follows commands intermittently.  Per discussion with his mother, baseline is Micheal Bell&O, no hx of chronic RLE weakness. 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 Head CT as above Arterial duplex with normal examination, no evidence of arterial occlusive disease ? Embolic dz  Will continue to monitor  T2DM: Continue lantus, coverage, and SSI.  Tradjenta.  Adjust as needed.  Persistent hyperglycemia.  Hypertension: BP ok, will continue to follow on metop   DVT prophylaxis: lovenox Code Status: full  Family Communication: mother Disposition:   Status is: Inpatient  Remains inpatient appropriate because:Inpatient level of care appropriate due to severity of  illness   Dispo: The patient is from: Home              Anticipated d/c is to: pending              Anticipated d/c date is: > 3 days              Patient currently is not medically stable to d/c.   Consultants:   PCCM  Cardiology  ID  Procedures:  Arterial doppler Summary:  Right: Normal examination. No evidence of arterial occlusive disease.  Normal waveforms, no stenosis.     See table(s) above for measurements and observations.   Echo IMPRESSIONS    1. Extremely poor acoustic windows. LVEF appears to be depressed with  diffuse hypokinesis, worse in the mid/distal inferior/inferoseptal walls.  . Left ventricular ejection fraction, by estimation, is 30 to 35%. The  left ventricle has moderately  decreased function. The left ventricular internal cavity size was mildly  dilated. Left ventricular diastolic parameters are indeterminate.  2. Right ventricular systolic function is moderately reduced. The right  ventricular size is mildly enlarged.  3. The mitral valve is normal in structure. Trivial mitral valve  regurgitation.  4. The aortic valve is normal in structure. Aortic valve regurgitation is  not visualized.   Antimicrobials:  Anti-infectives (From admission, onward)   Start     Dose/Rate Route Frequency Ordered Stop   10/12/19 2200  cefTRIAXone (ROCEPHIN) 2 g in sodium chloride 0.9 % 100 mL IVPB  Status:  Discontinued        2 g 200 mL/hr over 30 Minutes Intravenous Every 24 hours 10/12/19 0441 10/12/19 0921   10/12/19 0930  ceFAZolin (ANCEF) IVPB 2g/100 mL premix        2 g 200 mL/hr over 30 Minutes Intravenous Every 8 hours 10/12/19 0921     10/11/19 1000  remdesivir 100 mg in sodium chloride 0.9 % 100 mL IVPB       "Followed by" Linked Group Details   100 mg 200 mL/hr over 30 Minutes Intravenous Daily 10/10/19 0050 10/14/19 0956   10/11/19 0800  vancomycin (VANCOCIN) IVPB 1000 mg/200 mL premix  Status:  Discontinued        1,000 mg 200 mL/hr  over 60 Minutes Intravenous Every 8 hours 10/10/19 2239 10/12/19 0921   10/10/19 2230  vancomycin (VANCOCIN) IVPB 1000 mg/200 mL premix        1,000 mg 200 mL/hr over 60 Minutes Intravenous Every 1 hr x 2 10/10/19 2208 10/11/19 0052   10/10/19 2215  cefTRIAXone (ROCEPHIN) 2 g in sodium chloride 0.9 % 100 mL IVPB  Status:  Discontinued        2 g 200 mL/hr over 30 Minutes Intravenous Every 24 hours 10/10/19 2205 10/12/19 0441   10/10/19 0100  remdesivir 100 mg in sodium chloride 0.9 % 100 mL IVPB       "Followed by" Linked Group Details   100 mg 200 mL/hr over 30 Minutes Intravenous Every 30 min 10/10/19 0050 10/10/19 0332     Subjective: Alert, but doesn't speak more than Micheal Bell few words, confused  Objective: Vitals:   10/23/19 0800  10/23/19 0900 10/23/19 0904 10/23/19 1000  BP:  (!) 126/94 (!) 126/94 129/85  Pulse: (!) 127 (!) 123 (!) 128 (!) 131  Resp: (!) 26 (!) 38  (!) 35  Temp: 99.5 F (37.5 C)   99 F (37.2 C)  TempSrc: Oral   Oral  SpO2: 98% 92%  92%  Weight:      Height:        Intake/Output Summary (Last 24 hours) at 10/23/2019 1112 Last data filed at 10/23/2019 0906 Gross per 24 hour  Intake 1641.24 ml  Output 1815 ml  Net -173.76 ml   Filed Weights   10/21/19 0500 10/22/19 0423 10/23/19 0346  Weight: 116.1 kg 116.4 kg 116.3 kg    Examination:  General exam: Appears calm and comfortable  Respiratory system: tachypneic, satting well on RA Cardiovascular system: tachycardic, regular  Gastrointestinal system: Abdomen is nondistended, soft and nontender. Central nervous system: alert, but not speaking much, says 1 or 2 words.  Follows some commands intermittently.  RLE weakness.  Extremities: cool RLE, faint palpable DP pulse.  Ulceration to L foot.  Data Reviewed: I have personally reviewed following labs and imaging studies  CBC: Recent Labs  Lab 10/19/19 0431 10/19/19 0455 10/20/19 0350 10/20/19 0350 10/20/19 0444 10/21/19 0417 10/21/19 0458  10/22/19 0450 10/23/19 0352  WBC 10.8*  --  11.0*  --   --   --  15.3* 14.3* 18.7*  HGB 11.8*   < > 11.9*   < > 12.2* 12.2* 12.2* 11.6* 13.0  HCT 36.9*   < > 38.1*   < > 36.0* 36.0* 39.4 36.7* 40.9  MCV 95.3  --  98.7  --   --   --  97.3 94.1 93.8  PLT 299  --  302  --   --   --  368 319 523*   < > = values in this interval not displayed.    Basic Metabolic Panel: Recent Labs  Lab 10/19/19 0431 10/19/19 0455 10/20/19 0350 10/20/19 0350 10/20/19 0444 10/21/19 0417 10/21/19 0458 10/22/19 0450 10/23/19 0352  NA 139   < > 140   < > 141 139 143 138 139  K 4.0   < > 4.2   < > 4.2 3.9 3.8 3.3* 3.9  CL 98  --  100  --   --   --  98 98 99  CO2 32  --  30  --   --   --  34* 30 28  GLUCOSE 250*  --  247*  --   --   --  203* 185* 217*  BUN 28*  --  28*  --   --   --  27* 19 21*  CREATININE 0.72  --  0.61  --   --   --  0.65 0.53* 0.66  CALCIUM 8.1*  --  8.1*  --   --   --  8.7* 8.1* 9.0   < > = values in this interval not displayed.    GFR: Estimated Creatinine Clearance: 148 mL/min (by C-G formula based on SCr of 0.66 mg/dL).  Liver Function Tests: No results for input(s): AST, ALT, ALKPHOS, BILITOT, PROT, ALBUMIN in the last 168 hours.  CBG: Recent Labs  Lab 10/22/19 1944 10/22/19 2350 10/23/19 0340 10/23/19 0757 10/23/19 1012  GLUCAP 206* 166* 190* 263* 317*     Recent Results (from the past 240 hour(s))  MRSA PCR Screening     Status: None   Collection Time: 10/17/19  8:51 AM   Specimen: Nasal Mucosa; Nasopharyngeal  Result Value Ref Range Status   MRSA by PCR NEGATIVE NEGATIVE Final    Comment:        The GeneXpert MRSA Assay (FDA approved for NASAL specimens only), is one component of Micheal Bell comprehensive MRSA colonization surveillance program. It is not intended to diagnose MRSA infection nor to guide or monitor treatment for MRSA infections. Performed at [facility], [place] [address] [place], [place] [address]          Radiology Studies: No  results found.      Scheduled Meds: . baricitinib  4 mg Per Tube Daily  . chlorhexidine gluconate (MEDLINE KIT)  15 mL Mouth Rinse BID  . Chlorhexidine Gluconate Cloth  6 each Topical Daily  . docusate  100 mg Per Tube BID  . enoxaparin (LOVENOX) injection  60 mg Subcutaneous Daily  . famotidine  20 mg Per Tube BID  . feeding supplement (PROSource TF)  45 mL Per Tube BID  . insulin aspart  0-15 Units Subcutaneous Q4H  . insulin aspart  8 Units Subcutaneous Q4H  . insulin glargine  35 Units Subcutaneous BID  . metoprolol tartrate  12.5 mg Per Tube BID  . polyethylene glycol  17 g Per Tube Daily  . predniSONE  25 mg Per Tube Q breakfast   Followed by  . [START ON 10/25/2019] predniSONE  10 mg Per Tube Q breakfast  . sennosides  10 mL Per Tube BID  . sodium chloride flush  10-40 mL Intracatheter Q12H   Continuous Infusions: . sodium chloride Stopped (10/12/19 1606)  . sodium chloride 10 mL/hr at 10/23/19 0900  .  ceFAZolin (ANCEF) IV Stopped (10/23/19 0531)  . dexmedetomidine (PRECEDEX) IV infusion Stopped (10/22/19 1200)  . feeding supplement (VITAL 1.5 CAL) 1,000 mL (10/23/19 0404)  . lactated ringers       LOS: 11 days    Time spent: over 30 min 40 min critical care time with sepsis with unclear source   Fayrene Helper, MD [facility]   To contact the attending provider between 7A-7P or the covering provider during after hours 7P-7A, please log into the web site [email or web address] and access using universal [place] password for that web site. If you do not have the password, please call the hospital operator.  10/23/2019, 11:12 AM

## 2019-10-23 NOTE — Progress Notes (Signed)
Notified bedside nurse of need to draw lactic acid.  

## 2019-10-23 NOTE — TOC Initial Note (Addendum)
Transition of Care [facility]) - Initial/Assessment Note    Patient Details  Name: Micheal Bell MRN: [id number] Date of Birth: 1970-12-05  [facility] CM/SW Contact:    Lockie Pares, RN Phone Number: 10/23/2019, 5:54 PM  Clinical Narrative:                 Patient admitted for COVID intubated heart failure ED 35% ? Emboli to legs. Patient encephalopathic, saying few words, needs MRI at some point of head. Has cotrak still on  oxygen flow.  Patient uninsured has mother and sister listed as next of kin,  Financial counseling needed.   Expected Discharge Plan: Skilled Nursing Facility Barriers to Discharge: Continued Medical Work up   Patient Goals and CMS Choice    Will need SNF, but patient uninsured     Expected Discharge Plan and Services Expected Discharge Plan: Skilled Nursing Facility In-house Referral: Clinical Social Work Discharge Planning Services: CM Consult                                          Prior Living Arrangements/Services   Lives with:: Self Patient language and need for interpreter reviewed:: Yes        Need for Family Participation in Patient Care: Yes (Comment) Care giver support system in place?: Yes (comment)   Criminal Activity/Legal Involvement Pertinent to Current Situation/Hospitalization: No - Comment as needed  Activities of Daily Living Home Assistive Devices/Equipment: Cane (specify quad or straight) ADL Screening (condition at time of admission) Patient's cognitive ability adequate to safely complete daily activities?: Yes Is the patient deaf or have difficulty hearing?: No Does the patient have difficulty seeing, even when wearing glasses/contacts?: No Does the patient have difficulty concentrating, remembering, or making decisions?: No Patient able to express need for assistance with ADLs?: Yes Does the patient have difficulty dressing or bathing?: Yes Independently performs ADLs?: No Communication:  Independent Dressing (OT): Independent Grooming: Independent Feeding: Independent Bathing: Needs assistance Is this a change from baseline?: Change from baseline, expected to last >3 days Toileting: Needs assistance Is this a change from baseline?: Change from baseline, expected to last >3days In/Out Bed: Needs assistance Is this a change from baseline?: Change from baseline, expected to last >3 days Walks in Home: Needs assistance Is this a change from baseline?: Change from baseline, expected to last >3 days Does the patient have difficulty walking or climbing stairs?: Yes Weakness of Legs: Both Weakness of Arms/Hands: Both  Permission Sought/Granted      Share Information with NAME: Sister           Emotional Assessment         Alcohol / Substance Use: Not Applicable Psych Involvement: No (comment)  Admission diagnosis:  Hyperglycemia [R73.9] Hypoxia [R09.02] COVID-19 virus infection [U07.1] Lower respiratory tract infection due to COVID-19 virus [U07.1, J22] Patient Active Problem List   Diagnosis Date Noted  . Acute respiratory failure with hypoxemia (HCC)   . Hyperglycemia   . Hypoxia   . COVID-19 virus infection 10/10/2019  . Benign hypertension 07/23/2015  . Gastro-esophageal reflux disease with esophagitis 07/23/2015  . Gout 07/23/2015  . Obstructive sleep apnea 07/23/2015  . Snoring 07/23/2015  . Chronic idiopathic gout of multiple sites 06/02/2015  . Class 2 obesity in adult 06/02/2015  . Fibromyalgia 06/02/2015  . Irritable bowel syndrome with diarrhea 06/02/2015  . Mild persistent asthma  06/02/2015  . Reaction, adjustment, with depressed mood, prolonged 06/02/2015  . Type 2 diabetes mellitus without complication, without long-term current use of insulin (HCC) 06/02/2015   PCP:  [name], MD Pharmacy:   [facility] [place], [place] - [address] [place] [address] Phone: [phone] Fax:  [phone]     Social Determinants of Health (SDOH) Interventions    Readmission Risk Interventions No flowsheet data found.

## 2019-10-23 NOTE — Progress Notes (Signed)
   10/23/19 1100  Assess: MEWS Score  Temp 99.3 F (37.4 C)  BP 108/73  Pulse Rate (!) 129  ECG Heart Rate (!) 128  Resp (!) 34  Level of Consciousness Alert  SpO2 97 %  O2 Device Nasal Cannula  O2 Flow Rate (L/min) 2 L/min  Assess: MEWS Score  MEWS Temp 0  MEWS Systolic 0  MEWS Pulse 2  MEWS RR 2  MEWS LOC 0  MEWS Score 4  MEWS Score Color Red  Assess: if the MEWS score is Yellow or Red  Were vital signs taken at a resting state? Yes  Focused Assessment No change from prior assessment  Early Detection of Sepsis Score *See Row Information* Low  MEWS guidelines implemented *See Row Information* Yes  MD assessment complete 250 cc bolus ordered and cta. Patient sustaining heart of 120s and respirations trending between 19-42. Patient bolus started. Will continue to monitor

## 2019-10-23 NOTE — Progress Notes (Signed)
   10/23/19 1400  Assess: MEWS Score  Temp 98.8 F (37.1 C)  BP 122/80  Pulse Rate (!) 119  ECG Heart Rate (!) 119  Resp (!) 29  SpO2 94 %  O2 Device Nasal Cannula  O2 Flow Rate (L/min) 2 L/min  Assess: MEWS Score  MEWS Temp 0  MEWS Systolic 0  MEWS Pulse 2  MEWS RR 2  MEWS LOC 0  MEWS Score 4  MEWS Score Color Red  Assess: if the MEWS score is Yellow or Red  Were vital signs taken at a resting state? Yes  Focused Assessment No change from prior assessment  Early Detection of Sepsis Score *See Row Information* Low  MEWS guidelines implemented *See Row Information* Yes  Patient antibotic started, axox2-3 patient spoke on the phone with family, denied -pain, no s/s of increased respiratory demands. Will continue to monitor

## 2019-10-24 ENCOUNTER — Inpatient Hospital Stay (HOSPITAL_COMMUNITY): Payer: Medicaid Other | Admitting: Certified Registered"

## 2019-10-24 ENCOUNTER — Inpatient Hospital Stay (HOSPITAL_COMMUNITY): Payer: Medicaid Other

## 2019-10-24 ENCOUNTER — Encounter (HOSPITAL_COMMUNITY)
Admission: EM | Disposition: A | Discharge status: Home Health | Payer: Self-pay | Source: Home / Self Care | Attending: Internal Medicine

## 2019-10-24 ENCOUNTER — Encounter (HOSPITAL_COMMUNITY): Payer: Self-pay | Admitting: Cardiovascular Disease

## 2019-10-24 DIAGNOSIS — J80 Acute respiratory distress syndrome: Secondary | ICD-10-CM

## 2019-10-24 DIAGNOSIS — R7881 Bacteremia: Secondary | ICD-10-CM

## 2019-10-24 HISTORY — PX: TEE WITHOUT CARDIOVERSION: SHX5443

## 2019-10-24 LAB — CBC WITH DIFFERENTIAL/PLATELET
Abs Immature Granulocytes: 0.3 10*3/uL — ABNORMAL HIGH (ref 0.00–0.07)
Basophils Absolute: 0.1 10*3/uL (ref 0.0–0.1)
Basophils Relative: 0 %
Eosinophils Absolute: 0.1 10*3/uL (ref 0.0–0.5)
Eosinophils Relative: 0 %
HCT: 39.2 % (ref 39.0–52.0)
Hemoglobin: 12.3 g/dL — ABNORMAL LOW (ref 13.0–17.0)
Immature Granulocytes: 2 %
Lymphocytes Relative: 21 %
Lymphs Abs: 3.8 10*3/uL (ref 0.7–4.0)
MCH: 29.8 pg (ref 26.0–34.0)
MCHC: 31.4 g/dL (ref 30.0–36.0)
MCV: 94.9 fL (ref 80.0–100.0)
Monocytes Absolute: 1.1 10*3/uL — ABNORMAL HIGH (ref 0.1–1.0)
Monocytes Relative: 6 %
Neutro Abs: 12.8 10*3/uL — ABNORMAL HIGH (ref 1.7–7.7)
Neutrophils Relative %: 71 %
Platelets: 554 10*3/uL — ABNORMAL HIGH (ref 150–400)
RBC: 4.13 MIL/uL — ABNORMAL LOW (ref 4.22–5.81)
RDW: 12.2 % (ref 11.5–15.5)
WBC: 18.1 10*3/uL — ABNORMAL HIGH (ref 4.0–10.5)
nRBC: 0 % (ref 0.0–0.2)

## 2019-10-24 LAB — MAGNESIUM: Magnesium: 2 mg/dL (ref 1.7–2.4)

## 2019-10-24 LAB — COMPREHENSIVE METABOLIC PANEL
ALT: 31 U/L (ref 0–44)
AST: 34 U/L (ref 15–41)
Albumin: 2.3 g/dL — ABNORMAL LOW (ref 3.5–5.0)
Alkaline Phosphatase: 122 U/L (ref 38–126)
Anion gap: 12 (ref 5–15)
BUN: 22 mg/dL — ABNORMAL HIGH (ref 6–20)
CO2: 29 mmol/L (ref 22–32)
Calcium: 9.2 mg/dL (ref 8.9–10.3)
Chloride: 102 mmol/L (ref 98–111)
Creatinine, Ser: 0.76 mg/dL (ref 0.61–1.24)
GFR calc Af Amer: >60 mL/min (ref >60)
GFR calc non Af Amer: >60 mL/min (ref >60)
Glucose, Bld: 82 mg/dL (ref 70–99)
Potassium: 3.5 mmol/L (ref 3.5–5.1)
Sodium: 143 mmol/L (ref 135–145)
Total Bilirubin: 0.3 mg/dL (ref 0.3–1.2)
Total Protein: 6.9 g/dL (ref 6.5–8.1)

## 2019-10-24 LAB — PROCALCITONIN: Procalcitonin: <0.1 ng/mL

## 2019-10-24 LAB — GLUCOSE, CAPILLARY
Glucose-Capillary: 70 mg/dL (ref 70–99)
Glucose-Capillary: 101 mg/dL — ABNORMAL HIGH (ref 70–99)
Glucose-Capillary: 159 mg/dL — ABNORMAL HIGH (ref 70–99)
Glucose-Capillary: 167 mg/dL — ABNORMAL HIGH (ref 70–99)
Glucose-Capillary: 107 mg/dL — ABNORMAL HIGH (ref 70–99)

## 2019-10-24 LAB — PHOSPHORUS: Phosphorus: 4.3 mg/dL (ref 2.5–4.6)

## 2019-10-24 LAB — FERRITIN: Ferritin: 779 ng/mL — ABNORMAL HIGH (ref 24–336)

## 2019-10-24 LAB — C-REACTIVE PROTEIN: CRP: 5.1 mg/dL — ABNORMAL HIGH (ref <1.0)

## 2019-10-24 LAB — D-DIMER, QUANTITATIVE: D-Dimer, Quant: 3.55 ug/mL-FEU — ABNORMAL HIGH (ref 0.00–0.50)

## 2019-10-24 SURGERY — ECHOCARDIOGRAM, TRANSESOPHAGEAL
Anesthesia: Monitor Anesthesia Care

## 2019-10-24 MED ORDER — PROPOFOL 10 MG/ML IV BOLUS
INTRAVENOUS | Status: DC | PRN
  Administered 2019-10-24 (×3): 25 mg via INTRAVENOUS
  Administered 2019-10-24: 20 mg via INTRAVENOUS

## 2019-10-24 MED ORDER — PROPOFOL 500 MG/50ML IV EMUL
INTRAVENOUS | Status: DC | PRN
  Administered 2019-10-24: 100 ug/kg/min via INTRAVENOUS

## 2019-10-24 MED ORDER — SODIUM CHLORIDE 0.9 % IV SOLN: INTRAVENOUS | Status: DC

## 2019-10-24 MED ORDER — INSULIN GLARGINE 100 UNIT/ML ~~LOC~~ SOLN
20.0000 [IU] | Freq: Two times a day (BID) | SUBCUTANEOUS | Status: DC
  Administered 2019-10-24 – 2019-10-29 (×11): 20 [IU] via SUBCUTANEOUS
  Filled 2019-10-24 (×13): qty 0.2

## 2019-10-24 MED ORDER — DEXTROSE IN LACTATED RINGERS 5 % IV SOLN: INTRAVENOUS | Status: DC

## 2019-10-24 NOTE — Progress Notes (Signed)
Nutrition Follow-up  DOCUMENTATION CODES:   Not applicable  INTERVENTION:  Once Cortrak NGT replaced and ready for use, Continue Vital 1.5 at goal rate of  70 ml/hr with Pro-Source TF 45 mL BID per tube.  Provide 135 g of protein, 2600 kcals and 1277 mL of free water Meets 100% estimated calorie and protein needs  NUTRITION DIAGNOSIS:   Inadequate oral intake related to acute illness as evidenced by NPO status; ongoing  GOAL:   Patient will meet greater than or equal to 90% of their needs; progressing  MONITOR:   Weight trends, Vent status, Labs, TF tolerance, Skin  REASON FOR ASSESSMENT:   Ventilator    ASSESSMENT:   49 yo male admitted with COVID 19 pneumonia requiring intubation. PMH includes HTN, gout, DM, CHF, IBS Pt with MSSA bacteremia, GBS bacteremia, L diabetic foot wound.   8/16 Admitted to [facility] 8/19 Transferred to [facility], Intubated 8/25 Cortrak placed, gastric  8/28 Extubated  Pt underwent TEE today without evidence of vegetation. Noted Cortrak dislodged today during TEE. Tube feeds on hold. Plans for Cortrak NGT replacement tomorrow. Pt currently on 3 L nasal cannula. Recommend continuation of tube feeding orders once Cortrak placed and ready for use.   Labs and medications reviewed.   Diet Order:   Diet Order    None      EDUCATION NEEDS:   Not appropriate for education at this time  Skin:  Skin Assessment: Reviewed RN Assessment Skin Integrity Issues:: Diabetic Ulcer Diabetic Ulcer: L. Foot after stepping on nail 2-3 weeks ago Other: wound to L. Foot after stepping on nail 2-3 weeks ago  Last BM:  8/29  Height:   Ht Readings from Last 1 Encounters:  10/24/19 6\' 2"  (1.88 m)    Weight:   Wt Readings from Last 1 Encounters:  10/24/19 112.2 kg   BMI:  Body mass index is 31.76 kg/m.  Estimated Nutritional Needs:   Kcal:  2600-2800 kcals  Protein:  130-150 g  Fluid:  >/= 2 L  10/26/19, MS, RD, LDN RD pager number/after hours  weekend pager number on Amion.

## 2019-10-24 NOTE — Progress Notes (Signed)
RN informed by Endo RN that pt cortrak had been pulled out during procedure. Dietitian paged to replace cortrak per MD.  Per on call RD, cortrak placed MWF. Order for re-placement put in to be filled tomorrow.

## 2019-10-24 NOTE — Progress Notes (Addendum)
PROGRESS NOTE    Micheal Bell  NOM:[id number] DOB: 08/07/1970 DOA: 10/09/2019 PCP: [name], MD   Chief Complaint  Patient presents with  . Cough    Brief Narrative:  49 yo male with hx of HTN, T2DM, gout, IBS, fibromyalgia, obesity who presented on 8/16 for COVID pneumonia.  He decompensated on 8/19 and was intubated.  Self extubated on 8/23 and was reintubated.  He's subsequently been extubated on 8/28.  He's been on typical therapies for covid including steroids, baricitinib and remdesivir.  His hospitalization was complicated by MSSA and GBS bacteria.  Also found to have systolic heart failure.  Transferred to hospitalist service on 8/30.    Assessment & Plan:   Active Problems:   Acute respiratory distress syndrome (ARDS) due to COVID-19 virus (HCC)   Hyperglycemia   Hypoxia   Acute respiratory failure with hypoxemia (HCC)   Diabetic foot (HCC)   Acute systolic heart failure (HCC)   Embolic infarction ([place])  Sepsis with unclear source: tachycardia and tachypnea appear worsened on 8/29-30 while he was on treatment for MSSA/GBS bacteremia.  Sepsis could have been related to this vs COVID 19 infection, though he's been improving on treatment noted below.  Physiology improved today.     Continue current abx with ancef Follow repeat blood cx (NG < 24 hrs), UA (UA not c/w UTI) and urine culture (doesn't appear to have been obtained, foley d/c'd) Follow repeat procalcitonin (< 0.10), lactic acid (resolved) Follow 250 cc bolus with hx of new HF and follow CT PP protocol without PE, multifocal pulm infiltrates  LE [place] pending TEE without evidence of vegetation He has R arm PICC  Acute Hypoxic Respiratory Failure 2/2 COVID Pneumonia:  Intubated 8/19, extubated 8/28 Currently on 3 L [place] Positive test on 10/09/2019 Remdesivir 8/16 - 8/21 Currently on steroid taper Baricitinib 8/19 - 8/31--- discontinued on 8/31 with sepsis and known bacteremia Continue to monitor IS, daily  weights  COVID-19 Labs  Recent Labs    10/23/19 1058 10/24/19 0130  DDIMER 5.19* 3.55*  FERRITIN  --  779*  CRP  --  5.1*    Lab Results  Component Value Date   SARSCOV2NAA POSITIVE ([facility] Bivins) 10/09/2019   MSSA Bacteremia  GBS Bacteremia  L diabetic foot wound:  Appreciate ID assistance Vancomycin 8/17 - 8/19, ceftriaxone 8/17-18 Ancef 8/19 - present L foot with healing fx of 4th and 5th metatarsals with soft tissue swelling, but no definite evidence of osteo Follow ABI on left  L foot MRI pending Consult cards for TEE (without apparent vegetation - see report)  HFrEF with Exacerbation: new diagnosis? I don't see prior echo's in our system or care everywhere. Echo 8/19 with EF 30-35% with diffuse hypokinesis.  RV systolic function moderately reduced. (see report) With concern for sepsis above, hold lasix today, will give 250 cc bolus and follow response With new diagnosis and possible need for TEE with bacteremia above, will consult cardiology  Continue beta blocker, hold on ace/arb/arni with concern for sepsis above  Acute Metabolic Encephalopathy: suspect ICU/hospital delirium + COVID infection.  Seems to be improving gradually, he spoke more to me today and followed commands. Per discussion with his mother, baseline is Bryley Chrisman&O, no hx of chronic RLE weakness (pt isn't clear enough to discuss this with me today). 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 Head CT as above Arterial duplex with normal examination, no evidence of arterial occlusive disease ? Embolic dz -> but echo without evidence of vegetation Will continue to  monitor   T2DM: Continue lantus, coverage, and SSI.  Tradjenta.  Hold tube feed coverage (8 units q4) while cortrak dislodged.  Half lantus while tube feeds being held.  D5 containing fluids.  Inadequate Oral Intake related to acute illness: was receiving tube feeds in ICU, cortrak dislodged during TEE.  Will plan to replace cortrak SLP eval for swallow eval with AMS  Hypertension: BP ok, will continue to follow on metop   DVT prophylaxis: lovenox Code Status: full  Family Communication: mother Disposition:   Status is: Inpatient  Remains inpatient appropriate because:Inpatient level of care appropriate due to severity of illness   Dispo: The patient is from: Home              Anticipated d/c is to: pending              Anticipated d/c date is: > 3 days              Patient currently is not medically stable to d/c.   Consultants:   PCCM  Cardiology  ID  Procedures:  Arterial doppler Summary:  Right: Normal examination. No evidence of arterial occlusive disease.  Normal waveforms, no stenosis.     See table(s) above for measurements and observations.   Echo IMPRESSIONS    1. Extremely poor acoustic windows. LVEF appears to be depressed with  diffuse hypokinesis, worse in the mid/distal inferior/inferoseptal walls.  . Left ventricular ejection fraction, by estimation, is 30 to 35%. The  left ventricle has moderately  decreased function. The left ventricular internal cavity size was mildly  dilated. Left ventricular diastolic parameters are indeterminate.  2. Right ventricular systolic function is moderately reduced. The right  ventricular size is mildly enlarged.  3. The mitral valve is normal in structure. Trivial mitral valve  regurgitation.  4. The aortic valve is normal in structure. Aortic valve regurgitation is  not visualized.   TEE  IMPRESSIONS    1. Left ventricular ejection fraction, by estimation, is 50 to 55%. The  left ventricle has low  normal function. The left ventricle has no regional  wall motion abnormalities.  2. Right ventricular systolic function is normal. The right ventricular  size is normal.  3. No left atrial/left atrial appendage thrombus was detected.  4. The mitral valve is normal in structure. Trivial mitral valve  regurgitation. No evidence of mitral stenosis.  5. The aortic valve is normal in structure. Aortic valve regurgitation is  not visualized. No aortic stenosis is present.  6. The inferior vena cava is normal in size with greater than 50%  respiratory variability, suggesting right atrial pressure of 3 mmHg.   Antimicrobials:  Anti-infectives (From admission, onward)   Start     Dose/Rate Route Frequency Ordered Stop   10/12/19 2200  cefTRIAXone (ROCEPHIN) 2 g in sodium chloride 0.9 % 100 mL IVPB  Status:  Discontinued        2 g 200 mL/hr over 30 Minutes Intravenous Every 24 hours 10/12/19 0441 10/12/19 0921   10/12/19 0930  ceFAZolin (ANCEF) IVPB 2g/100 mL premix  2 g 200 mL/hr over 30 Minutes Intravenous Every 8 hours 10/12/19 0921     10/11/19 1000  remdesivir 100 mg in sodium chloride 0.9 % 100 mL IVPB       "Followed by" Linked Group Details   100 mg 200 mL/hr over 30 Minutes Intravenous Daily 10/10/19 0050 10/14/19 0956   10/11/19 0800  vancomycin (VANCOCIN) IVPB 1000 mg/200 mL premix  Status:  Discontinued        1,000 mg 200 mL/hr over 60 Minutes Intravenous Every 8 hours 10/10/19 2239 10/12/19 0921   10/10/19 2230  vancomycin (VANCOCIN) IVPB 1000 mg/200 mL premix        1,000 mg 200 mL/hr over 60 Minutes Intravenous Every 1 hr x 2 10/10/19 2208 10/11/19 0052   10/10/19 2215  cefTRIAXone (ROCEPHIN) 2 g in sodium chloride 0.9 % 100 mL IVPB  Status:  Discontinued        2 g 200 mL/hr over 30 Minutes Intravenous Every 24 hours 10/10/19 2205 10/12/19 0441   10/10/19 0100  remdesivir 100 mg in sodium chloride 0.9 % 100 mL IVPB       "Followed by" Linked Group Details   100  mg 200 mL/hr over 30 Minutes Intravenous Every 30 min 10/10/19 0050 10/10/19 0332     Subjective: No new complaints Auren Valdes&Ox2 today  Objective: Vitals:   10/24/19 1524 10/24/19 1533 10/24/19 1539 10/24/19 1559  BP: 116/71 109/78 119/74 124/83  Pulse: (!) 116 (!) 107 100 97  Resp: (!) 23 (!) 26 (!) 30 19  Temp:   98.7 F (37.1 C) 98.6 F (37 C)  TempSrc:   Oral Oral  SpO2: 99% 98% 99% 95%  Weight:      Height:        Intake/Output Summary (Last 24 hours) at 10/24/2019 1653 Last data filed at 10/24/2019 1652 Gross per 24 hour  Intake 490 ml  Output 600 ml  Net -110 ml   Filed Weights   10/22/19 0423 10/23/19 0346 10/24/19 0331  Weight: 116.4 kg 116.3 kg 112.2 kg    Examination:  General: No acute distress. Cardiovascular: Heart sounds show Ambria Mayfield regular rate, and rhythm. Lungs: Clear to auscultation bilaterally  Abdomen: Soft, nontender, nondistended  Neurological: Alert and oriented 2. RLE weakness. Cranial nerves II through XII grossly intact. Skin: Warm and dry. No rashes or lesions. Extremities: intact dressing   Data Reviewed: I have personally reviewed following labs and imaging studies  CBC: Recent Labs  Lab 10/20/19 0350 10/20/19 0444 10/21/19 0417 10/21/19 0458 10/22/19 0450 10/23/19 0352 10/24/19 0130  WBC 11.0*  --   --  15.3* 14.3* 18.7* 18.1*  NEUTROABS  --   --   --   --   --   --  12.8*  HGB 11.9*   < > 12.2* 12.2* 11.6* 13.0 12.3*  HCT 38.1*   < > 36.0* 39.4 36.7* 40.9 39.2  MCV 98.7  --   --  97.3 94.1 93.8 94.9  PLT 302  --   --  368 319 523* 554*   < > = values in this interval not displayed.    Basic Metabolic Panel: Recent Labs  Lab 10/20/19 0350 10/20/19 0444 10/21/19 0417 10/21/19 0458 10/22/19 0450 10/23/19 0352 10/24/19 0130  NA 140   < > 139 143 138 139 143  K 4.2   < > 3.9 3.8 3.3* 3.9 3.5  CL 100  --   --  98 98 99 102  CO2 30  --   --  34* 30 28 29   GLUCOSE 247*  --   --  203* 185* 217* 82  BUN 28*  --   --  27* 19  21* 22*  CREATININE 0.61  --   --  0.65 0.53* 0.66 0.76  CALCIUM 8.1*  --   --  8.7* 8.1* 9.0 9.2  MG  --   --   --   --   --   --  2.0  PHOS  --   --   --   --   --   --  4.3   < > = values in this interval not displayed.    GFR: Estimated Creatinine Clearance: 145.6 mL/min (by C-G formula based on SCr of 0.76 mg/dL).  Liver Function Tests: Recent Labs  Lab 10/24/19 0130  AST 34  ALT 31  ALKPHOS 122  BILITOT 0.3  PROT 6.9  ALBUMIN 2.3*    CBG: Recent Labs  Lab 10/23/19 2312 10/24/19 0324 10/24/19 0751 10/24/19 1139 10/24/19 1604  GLUCAP 182* 70 101* 159* 167*     Recent Results (from the past 240 hour(s))  MRSA PCR Screening     Status: None   Collection Time: 10/17/19  8:51 AM   Specimen: Nasal Mucosa; Nasopharyngeal  Result Value Ref Range Status   MRSA by PCR NEGATIVE NEGATIVE Final    Comment:        The GeneXpert MRSA Assay (FDA approved for NASAL specimens only), is one component of Zakhari Fogel comprehensive MRSA colonization surveillance program. It is not intended to diagnose MRSA infection nor to guide or monitor treatment for MRSA infections. Performed at [facility], [place] [address], [place], [place] [address]   Culture, blood (routine x 2)     Status: None (Preliminary result)   Collection Time: 10/23/19 10:58 AM   Specimen: BLOOD  Result Value Ref Range Status   Specimen Description BLOOD LEFT ANTECUBITAL  Final   Special Requests   Final    BOTTLES DRAWN AEROBIC ONLY Blood Culture adequate volume   Culture   Final    NO GROWTH < 24 HOURS Performed at [facility], [place] [address] [place], [place] [address]    Report Status PENDING  Incomplete  Culture, blood (routine x 2)     Status: None (Preliminary result)   Collection Time: 10/23/19 11:00 AM   Specimen: BLOOD LEFT HAND  Result Value Ref Range Status   Specimen Description BLOOD LEFT HAND  Final   Special Requests   Final    BOTTLES DRAWN AEROBIC ONLY Blood Culture adequate  volume   Culture   Final    NO GROWTH < 24 HOURS Performed at [facility], [place] [address] [place], [place] [address]    Report Status PENDING  Incomplete         Radiology Studies: CT ANGIO CHEST PE W OR WO CONTRAST  Result Date: 10/23/2019 CLINICAL DATA:  COVID pneumonia, acute hypoxic respiratory failure, positive D-dimer EXAM: CT ANGIOGRAPHY CHEST WITH CONTRAST TECHNIQUE: Multidetector CT imaging of the chest was performed using the standard protocol during bolus administration of intravenous contrast. Multiplanar CT image reconstructions and MIPs were obtained to evaluate the vascular anatomy. CONTRAST:  23m OMNIPAQUE IOHEXOL 350 MG/ML SOLN COMPARISON:  None. FINDINGS: Cardiovascular: Satisfactory opacification of the pulmonary arteries to the segmental level. No evidence of pulmonary embolism. The central pulmonary arteries are of normal caliber. There is moderate coronary artery calcification. Normal heart size. No pericardial effusion.  The thoracic aorta is unremarkable save for bovine arch anatomy. Right upper extremity PICC line tip is seen within the superior vena cava. Mediastinum/Nodes: No enlarged mediastinal, hilar, or axillary lymph nodes. Thyroid gland, trachea, and esophagus demonstrate no significant findings. Nasoenteric feeding tube extends into the upper abdomen beyond the margin of the examination. Lungs/Pleura: Lung volumes are small. Evaluation is limited by motion artifact, however, multifocal pulmonary infiltrates are identified demonstrating Hermen Mario peripheral and basal predominance, likely infectious or inflammatory in the acute setting. No central obstructing lesion. No pneumothorax or pleural effusion. No superimposed interstitial edema. Upper Abdomen: No acute abnormality. Musculoskeletal: No chest wall abnormality. No acute or significant osseous findings. Review of the MIP images confirms the above findings. IMPRESSION: 1. No evidence of pulmonary embolism. 2.  Multifocal pulmonary infiltrates are identified demonstrating Doshie Maggi peripheral and basal predominance, likely infectious or inflammatory in the acute setting. 3. Moderate coronary artery calcification. Electronically Signed   By: Fidela Salisbury MD   On: 10/23/2019 16:25   DG Foot 2 Views Left  Result Date: 10/23/2019 CLINICAL DATA:  Bacteremia, LEFT foot pain and diabetic. EXAM: LEFT FOOT - 2 VIEW COMPARISON:  10/13/2019 and prior radiographs FINDINGS: Mildly angulated fractures of the 4th and 5th metatarsals are again noted with fracture lines still evident. Healing changes along these fractures are noted. Overlying soft tissue swelling is noted. No new fracture, subluxation or dislocation noted. No definite radiographic evidence of acute osteomyelitis noted. IMPRESSION: Mildly angulated healing fractures of the 4th and 5th metatarsals with soft tissue swelling again noted but without definite radiographic evidence of acute osteomyelitis. Consider MRI for further evaluation as clinically indicated. Electronically Signed   By: Margarette Canada M.D.   On: 10/23/2019 13:40   ECHO TEE  Result Date: 10/24/2019    TRANSESOPHOGEAL ECHO REPORT   Patient Name:   SHAFTER JUPIN Date of Exam: 10/24/2019 Medical Rec #:  [id number]      Height:       74.0 in Accession #:    [id number]     Weight:       247.4 lb Date of Birth:  03-02-1970      BSA:          2.379 m Patient Age:    76 years       BP:           120/80 mmHg Patient Gender: M              HR:           100 bpm. Exam Location:  Inpatient Procedure: Transesophageal Echo Indications:    Emobli/Bacteremia  History:        Patient has prior history of Echocardiogram examinations.  Sonographer:    Clayton Lefort RDCS (AE) Referring Phys: [phone] HAO MENG PROCEDURE: The transesophogeal probe was passed without difficulty through the esophogus of the patient. Sedation performed by different physician. The patient's vital signs; including heart rate, blood pressure, and oxygen  saturation; remained stable throughout the procedure. The patient developed no complications during the procedure. IMPRESSIONS  1. Left ventricular ejection fraction, by estimation, is 50 to 55%. The left ventricle has low normal function. The left ventricle has no regional wall motion abnormalities.  2. Right ventricular systolic function is normal. The right ventricular size is normal.  3. No left atrial/left atrial appendage thrombus was detected.  4. The mitral valve is normal in structure. Trivial mitral valve regurgitation. No evidence of mitral stenosis.  5. The aortic  valve is normal in structure. Aortic valve regurgitation is not visualized. No aortic stenosis is present.  6. The inferior vena cava is normal in size with greater than 50% respiratory variability, suggesting right atrial pressure of 3 mmHg. Conclusion(s)/Recommendation(s): Normal biventricular function without evidence of hemodynamically significant valvular heart disease. FINDINGS  Left Ventricle: Left ventricular ejection fraction, by estimation, is 50 to 55%. The left ventricle has low normal function. The left ventricle has no regional wall motion abnormalities. The left ventricular internal cavity size was normal in size. There is no left ventricular hypertrophy. Right Ventricle: The right ventricular size is normal. No increase in right ventricular wall thickness. Right ventricular systolic function is normal. Left Atrium: Left atrial size was normal in size. No left atrial/left atrial appendage thrombus was detected. Right Atrium: Right atrial size was normal in size. Pericardium: There is no evidence of pericardial effusion. Mitral Valve: The mitral valve is normal in structure. Normal mobility of the mitral valve leaflets. Trivial mitral valve regurgitation. No evidence of mitral valve stenosis. There is no evidence of mitral valve vegetation. Tricuspid Valve: The tricuspid valve is normal in structure. Tricuspid valve regurgitation  is mild . No evidence of tricuspid stenosis. There is no evidence of tricuspid valve vegetation. Aortic Valve: The aortic valve is normal in structure. Aortic valve regurgitation is not visualized. No aortic stenosis is present. There is no evidence of aortic valve vegetation. Pulmonic Valve: The pulmonic valve was normal in structure. Pulmonic valve regurgitation is trivial. No evidence of pulmonic stenosis. Aorta: The aortic root is normal in size and structure. Venous: The inferior vena cava is normal in size with greater than 50% respiratory variability, suggesting right atrial pressure of 3 mmHg. IAS/Shunts: No atrial level shunt detected by color flow Doppler. Jenkins Rouge MD Electronically signed by Jenkins Rouge MD Signature Date/Time: 10/24/2019/3:28:45 PM    Final         Scheduled Meds: . chlorhexidine gluconate (MEDLINE KIT)  15 mL Mouth Rinse BID  . Chlorhexidine Gluconate Cloth  6 each Topical Daily  . docusate  100 mg Per Tube BID  . enoxaparin (LOVENOX) injection  60 mg Subcutaneous Daily  . famotidine  20 mg Per Tube BID  . feeding supplement (PROSource TF)  45 mL Per Tube BID  . insulin aspart  0-15 Units Subcutaneous Q4H  . insulin aspart  8 Units Subcutaneous Q4H  . insulin glargine  40 Units Subcutaneous BID  . linagliptin  5 mg Oral Daily  . metoprolol succinate  25 mg Oral Daily  . polyethylene glycol  17 g Per Tube Daily  . [START ON 10/25/2019] predniSONE  10 mg Per Tube Q breakfast  . sennosides  10 mL Per Tube BID  . sodium chloride flush  10-40 mL Intracatheter Q12H   Continuous Infusions: . sodium chloride Stopped (10/12/19 1606)  . sodium chloride 10 mL/hr at 10/23/19 0900  .  ceFAZolin (ANCEF) IV 2 g (10/24/19 1312)  . feeding supplement (VITAL 1.5 CAL) Stopped (10/24/19 0000)     LOS: 12 days    Time spent: over 30 min    Fayrene Helper, MD [facility]   To contact the attending provider between 7A-7P or the covering provider during after  hours 7P-7A, please log into the web site [email or web address] and access using universal [place] password for that web site. If you do not have the password, please call the hospital operator.  10/24/2019, 4:53 PM

## 2019-10-24 NOTE — Progress Notes (Addendum)
Patient is scheduled for TEE this afternoon. Called and informed nurse. Consent previously given my patient's mother.   Micheal Gerads Fransico Michael, PA-C

## 2019-10-24 NOTE — Anesthesia Procedure Notes (Signed)
Procedure Name: MAC Date/Time: 10/24/2019 3:10 PM Performed by: Imagene Riches, CRNA Pre-anesthesia Checklist: Patient identified, Emergency Drugs available, Suction available, Patient being monitored and Timeout performed Patient Re-evaluated:Patient Re-evaluated prior to induction Oxygen Delivery Method: Nasal cannula

## 2019-10-24 NOTE — Anesthesia Preprocedure Evaluation (Signed)
Anesthesia Evaluation  Patient identified by MRN, date of birth, ID bandGeneral Assessment Comment:Awake , intermittently confused, respiratory extremis   Reviewed: Allergy & Precautions, NPO status , Patient's Chart, lab work & pertinent test results  History of Anesthesia Complications Negative for: history of anesthetic complications  Airway Mallampati: III  TM Distance: >3 FB Neck ROM: Full    Dental  (+) Dental Advisory Given   Pulmonary asthma , sleep apnea , Recent URI ,  Covid-19 Nucleic Acid Test Results Lab Results      Component                Value               Date                      SARSCOV2NAA              POSITIVE (A)        10/09/2019             covid ARDS intubated s/p extubation on high flow nasal cannula    + decreased breath sounds      Cardiovascular hypertension, +CHF   Rhythm:Regular Rate:Tachycardia     Neuro/Psych PSYCHIATRIC DISORDERS negative neurological ROS     GI/Hepatic negative GI ROS, Neg liver ROS,   Endo/Other  diabetes  Renal/GU negative Renal ROS     Musculoskeletal  (+) Arthritis , Fibromyalgia -  Abdominal   Peds  Hematology   Anesthesia Other Findings Sepsis, likely secondary to left diabetic foot wound:  Reproductive/Obstetrics                             Anesthesia Physical Anesthesia Plan  ASA: IV  Anesthesia Plan: MAC   Post-op Pain Management:    Induction: Intravenous  PONV Risk Score and Plan: 1 and Treatment may vary due to age or medical condition and Propofol infusion  Airway Management Planned: Nasal Cannula  Additional Equipment: None  Intra-op Plan:   Post-operative Plan:   Informed Consent: I have reviewed the patients History and Physical, chart, labs and discussed the procedure including the risks, benefits and alternatives for the proposed anesthesia with the patient or authorized representative who has indicated  his/her understanding and acceptance.     Dental advisory given  Plan Discussed with: CRNA and Surgeon  Anesthesia Plan Comments:         Anesthesia Quick Evaluation

## 2019-10-24 NOTE — Anesthesia Postprocedure Evaluation (Signed)
Anesthesia Post Note  Patient: Micheal Bell  Procedure(s) Performed: TRANSESOPHAGEAL ECHOCARDIOGRAM (TEE) (N/A )     Patient location during evaluation: Endoscopy Anesthesia Type: MAC Level of consciousness: patient cooperative and awake Pain management: pain level controlled Vital Signs Assessment: post-procedure vital signs reviewed and stable Respiratory status: spontaneous breathing, patient connected to nasal cannula oxygen and respiratory function unstable Cardiovascular status: stable Postop Assessment: no apparent nausea or vomiting Anesthetic complications: no   No complications documented.  Last Vitals:  Vitals:   10/24/19 1539 10/24/19 1559  BP: 119/74 124/83  Pulse: 100 97  Resp: (!) 30 19  Temp: 37.1 C 37 C  SpO2: 99% 95%    Last Pain:  Vitals:   10/24/19 1559  TempSrc: Oral  PainSc:                  Shontia Gillooly

## 2019-10-24 NOTE — CV Procedure (Signed)
Anesthesia:  Propofol TEE  EF 50-55% Trivial MR Mild TR No ASD/PFO Normal AV No effusion  Normal RV small with catheter seen in RV No LAA thrombus  No SOE/ vegetations   Micheal Haws MD [facility]

## 2019-10-24 NOTE — Progress Notes (Signed)
  Echocardiogram Echocardiogram Transesophageal has been performed.  Gerda Diss 10/24/2019, 3:48 PM

## 2019-10-24 NOTE — Transfer of Care (Signed)
Immediate Anesthesia Transfer of Care Note  Patient: Micheal Bell  Procedure(s) Performed: TRANSESOPHAGEAL ECHOCARDIOGRAM (TEE) (N/A )  Patient Location: Endoscopy Unit  Anesthesia Type:MAC  Level of Consciousness: drowsy  Airway & Oxygen Therapy: Patient Spontanous Breathing and Patient connected to nasal cannula oxygen  Post-op Assessment: Report given to RN and Post -op Vital signs reviewed and stable  Post vital signs: Reviewed and stable  Last Vitals:  Vitals Value Taken Time  BP    Temp    Pulse    Resp    SpO2      Last Pain:  Vitals:   10/24/19 1456  TempSrc: Oral  PainSc: 0-No pain         Complications: No complications documented.

## 2019-10-24 NOTE — Interval H&P Note (Signed)
History and Physical Interval Note:  10/24/2019 2:28 PM  Micheal Bell  has presented today for surgery, with the diagnosis of bacteremia.  The various methods of treatment have been discussed with the patient and family. After consideration of risks, benefits and other options for treatment, the patient has consented to  Procedure(s): TRANSESOPHAGEAL ECHOCARDIOGRAM (TEE) (N/A) as a surgical intervention.  The patient's history has been reviewed, patient examined, no change in status, stable for surgery.  I have reviewed the patient's chart and labs.  Questions were answered to the patient's satisfaction.     [name]

## 2019-10-25 ENCOUNTER — Inpatient Hospital Stay (HOSPITAL_COMMUNITY): Payer: Medicaid Other

## 2019-10-25 DIAGNOSIS — I5021 Acute systolic (congestive) heart failure: Secondary | ICD-10-CM

## 2019-10-25 DIAGNOSIS — B9561 Methicillin susceptible Staphylococcus aureus infection as the cause of diseases classified elsewhere: Secondary | ICD-10-CM

## 2019-10-25 DIAGNOSIS — I749 Embolism and thrombosis of unspecified artery: Secondary | ICD-10-CM

## 2019-10-25 DIAGNOSIS — R7881 Bacteremia: Secondary | ICD-10-CM

## 2019-10-25 LAB — CBC WITH DIFFERENTIAL/PLATELET
Abs Immature Granulocytes: 0.23 10*3/uL — ABNORMAL HIGH (ref 0.00–0.07)
Basophils Absolute: 0.1 10*3/uL (ref 0.0–0.1)
Basophils Relative: 1 %
Eosinophils Absolute: 0.1 10*3/uL (ref 0.0–0.5)
Eosinophils Relative: 1 %
HCT: 40.1 % (ref 39.0–52.0)
Hemoglobin: 13.2 g/dL (ref 13.0–17.0)
Immature Granulocytes: 2 %
Lymphocytes Relative: 16 %
Lymphs Abs: 2.4 10*3/uL (ref 0.7–4.0)
MCH: 31 pg (ref 26.0–34.0)
MCHC: 32.9 g/dL (ref 30.0–36.0)
MCV: 94.1 fL (ref 80.0–100.0)
Monocytes Absolute: 0.8 10*3/uL (ref 0.1–1.0)
Monocytes Relative: 5 %
Neutro Abs: 11.8 10*3/uL — ABNORMAL HIGH (ref 1.7–7.7)
Neutrophils Relative %: 75 %
Platelets: 506 10*3/uL — ABNORMAL HIGH (ref 150–400)
RBC: 4.26 MIL/uL (ref 4.22–5.81)
RDW: 12.4 % (ref 11.5–15.5)
WBC: 15.5 10*3/uL — ABNORMAL HIGH (ref 4.0–10.5)
nRBC: 0 % (ref 0.0–0.2)

## 2019-10-25 LAB — GLUCOSE, CAPILLARY
Glucose-Capillary: 102 mg/dL — ABNORMAL HIGH (ref 70–99)
Glucose-Capillary: 122 mg/dL — ABNORMAL HIGH (ref 70–99)
Glucose-Capillary: 183 mg/dL — ABNORMAL HIGH (ref 70–99)
Glucose-Capillary: 236 mg/dL — ABNORMAL HIGH (ref 70–99)
Glucose-Capillary: 278 mg/dL — ABNORMAL HIGH (ref 70–99)
Glucose-Capillary: 99 mg/dL (ref 70–99)

## 2019-10-25 LAB — MAGNESIUM: Magnesium: 1.7 mg/dL (ref 1.7–2.4)

## 2019-10-25 LAB — PHOSPHORUS: Phosphorus: 4.4 mg/dL (ref 2.5–4.6)

## 2019-10-25 LAB — COMPREHENSIVE METABOLIC PANEL
ALT: 25 U/L (ref 0–44)
AST: 30 U/L (ref 15–41)
Albumin: 2.5 g/dL — ABNORMAL LOW (ref 3.5–5.0)
Alkaline Phosphatase: 105 U/L (ref 38–126)
Anion gap: 12 (ref 5–15)
BUN: 17 mg/dL (ref 6–20)
CO2: 27 mmol/L (ref 22–32)
Calcium: 9.1 mg/dL (ref 8.9–10.3)
Chloride: 102 mmol/L (ref 98–111)
Creatinine, Ser: 0.69 mg/dL (ref 0.61–1.24)
GFR calc Af Amer: >60 mL/min (ref >60)
GFR calc non Af Amer: >60 mL/min (ref >60)
Glucose, Bld: 100 mg/dL — ABNORMAL HIGH (ref 70–99)
Potassium: 3.3 mmol/L — ABNORMAL LOW (ref 3.5–5.1)
Sodium: 141 mmol/L (ref 135–145)
Total Bilirubin: 0.4 mg/dL (ref 0.3–1.2)
Total Protein: 6.9 g/dL (ref 6.5–8.1)

## 2019-10-25 LAB — TYPE AND SCREEN
ABO/RH(D): O POS
Antibody Screen: NEGATIVE

## 2019-10-25 LAB — D-DIMER, QUANTITATIVE: D-Dimer, Quant: 5.12 ug/mL-FEU — ABNORMAL HIGH (ref 0.00–0.50)

## 2019-10-25 LAB — BRAIN NATRIURETIC PEPTIDE: B Natriuretic Peptide: 34.4 pg/mL (ref 0.0–100.0)

## 2019-10-25 LAB — PROCALCITONIN: Procalcitonin: <0.1 ng/mL

## 2019-10-25 LAB — FERRITIN: Ferritin: 658 ng/mL — ABNORMAL HIGH (ref 24–336)

## 2019-10-25 LAB — PROTIME-INR
INR: 1.2 (ref 0.8–1.2)
Prothrombin Time: 14.7 seconds (ref 11.4–15.2)

## 2019-10-25 LAB — C-REACTIVE PROTEIN: CRP: 3.7 mg/dL — ABNORMAL HIGH (ref <1.0)

## 2019-10-25 LAB — ABO/RH: ABO/RH(D): O POS

## 2019-10-25 IMAGING — DX DG CHEST 1V PORT
1 series · 1 of 1 positions shown · non-contrast
Comparison: [DATE].

CLINICAL DATA: Shortness of breath.

EXAM:
PORTABLE CHEST 1 VIEW

[chest]
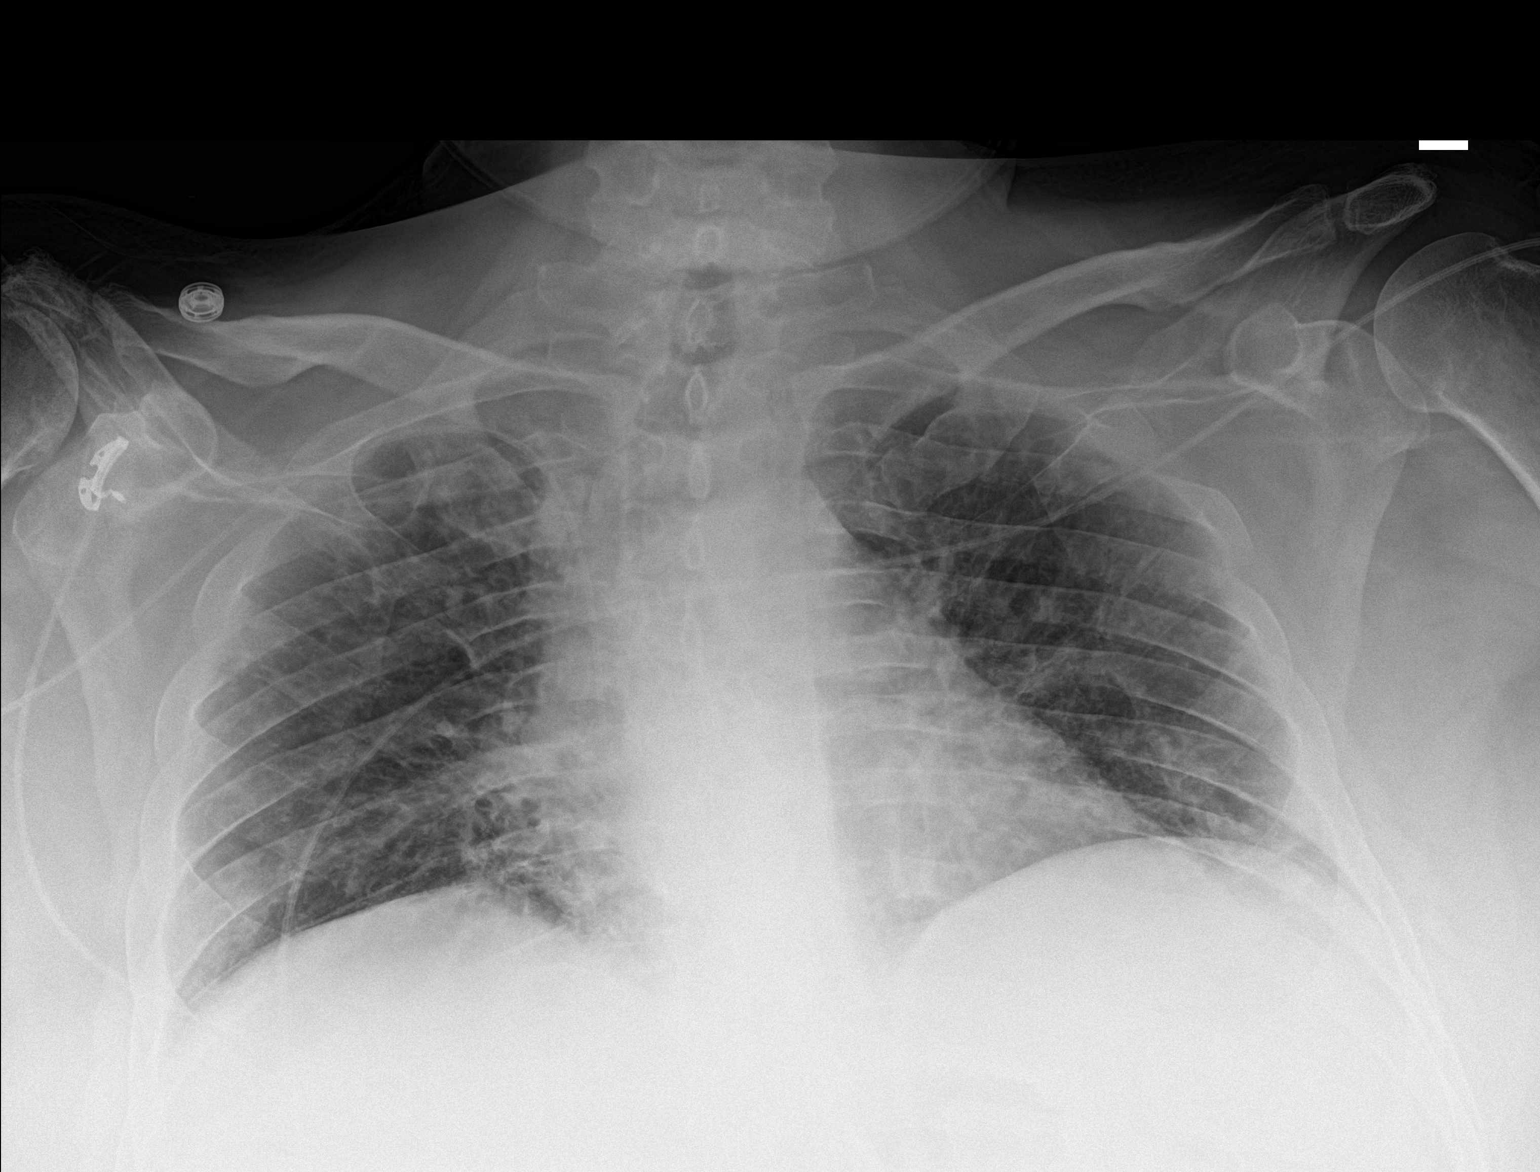

[1 of 1 positions shown; findings below may reference images not displayed]

FINDINGS: The heart size and mediastinal contours are within normal limits.
Hypoinflation of the lungs is noted with mild bibasilar subsegmental
atelectasis. Endotracheal tube has been removed. Right-sided PICC
line is unchanged in position. The visualized skeletal structures
are unremarkable.
IMPRESSION: Hypoinflation of the lungs with mild bibasilar subsegmental
atelectasis.

## 2019-10-25 IMAGING — MR MR FOOT*L* W/O CM
4 of 5 series · 18 of 40 positions shown · non-contrast
Comparison: [DATE]

CLINICAL DATA: Bacteremia left foot pain and diabetic

EXAM:
MRI OF THE LEFT FOOT WITHOUT CONTRAST
TECHNIQUE: Multiplanar, multisequence MR imaging of the left was performed. No
intravenous contrast was administered.

[Series 5: T2 fat-sat · oblique · 3.0mm · 0.31mm/px · 9 of 46 slices shown]
[im 1/46]
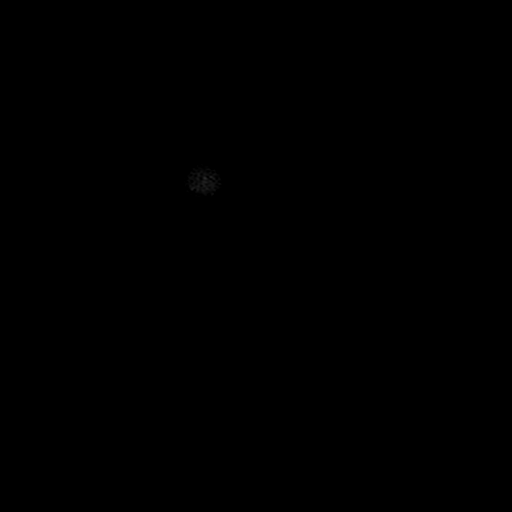
[im 6/46]
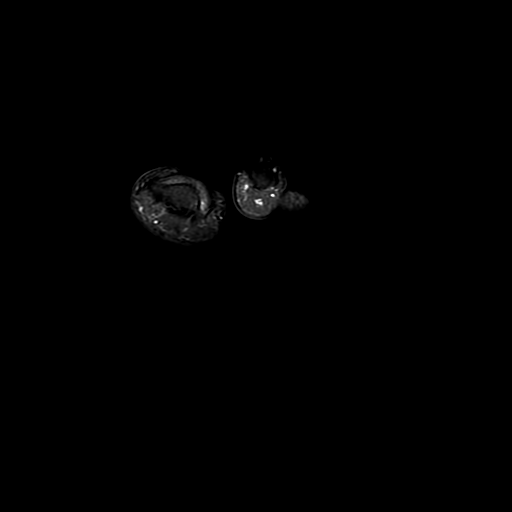
[im 12/46]
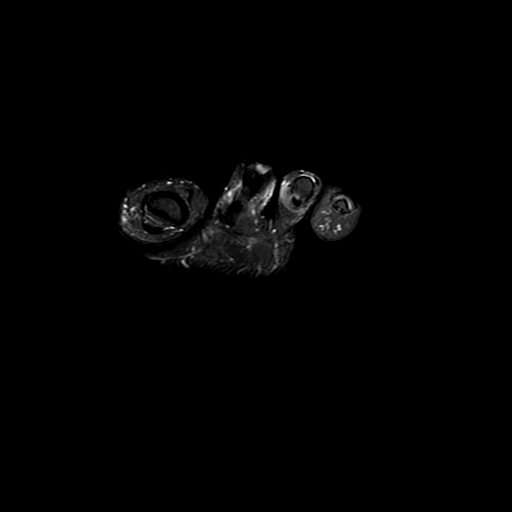
[im 17/46]
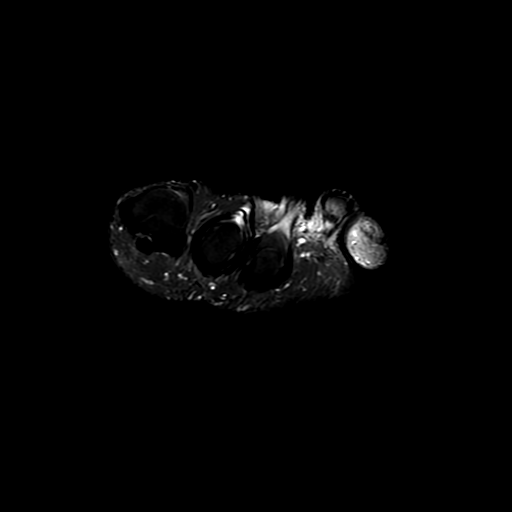
[im 23/46]
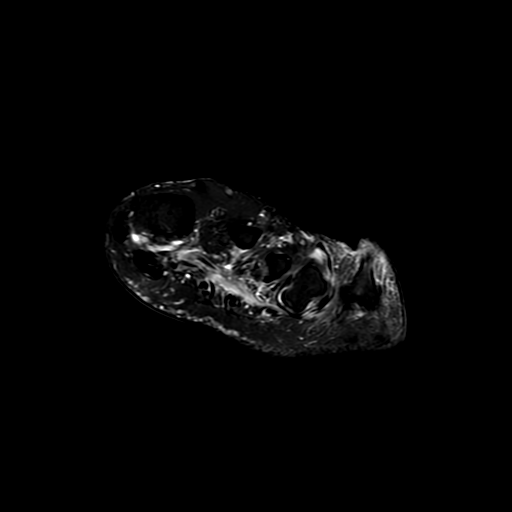
[im 29/46]
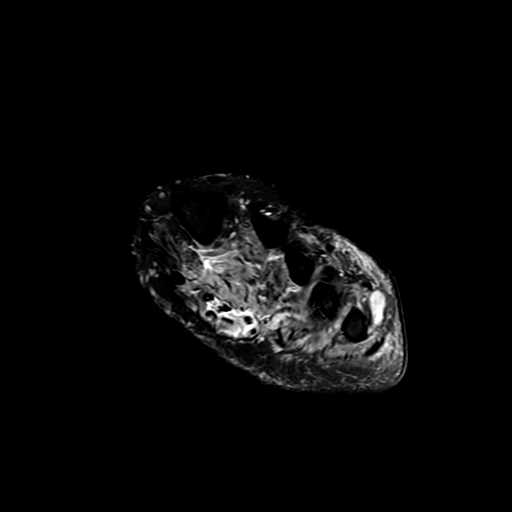
[im 34/46]
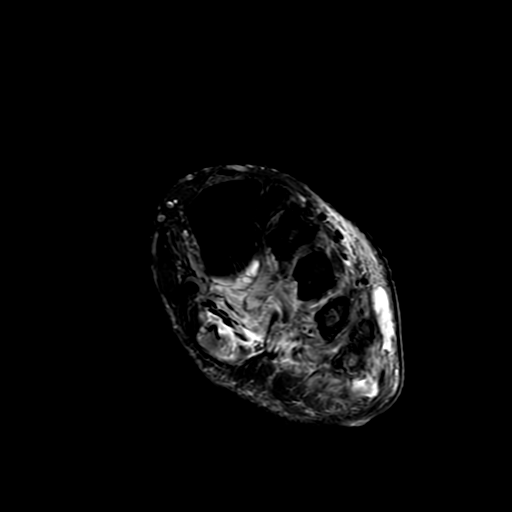
[im 40/46]
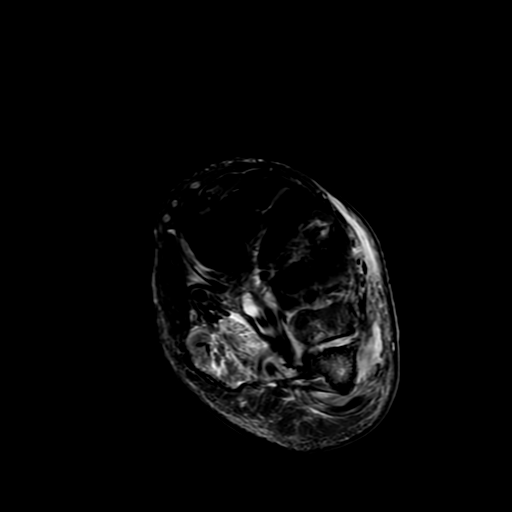
[im 46/46]
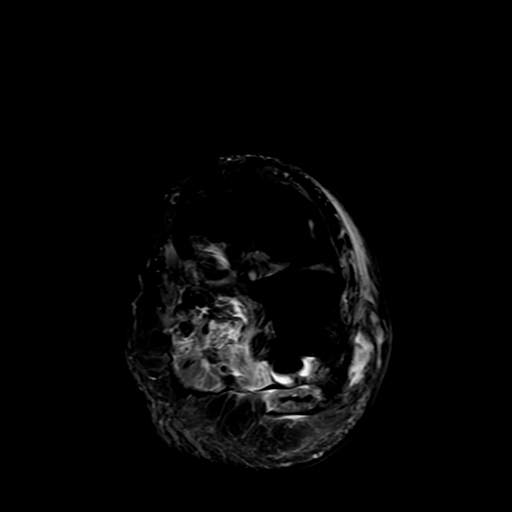

[Series 6: T1 · oblique · 3.0mm · 0.31mm/px · 3 of 46 slices shown (1 of 2)]
[im 6/46]
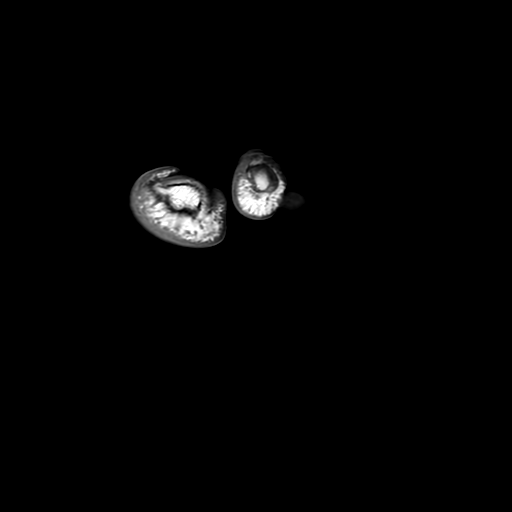
[im 26/46]
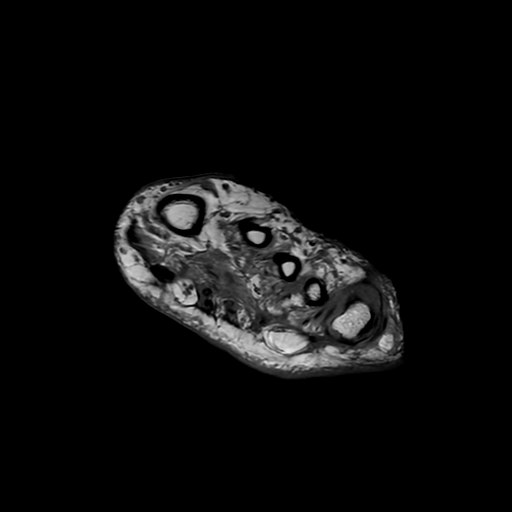
[im 41/46]
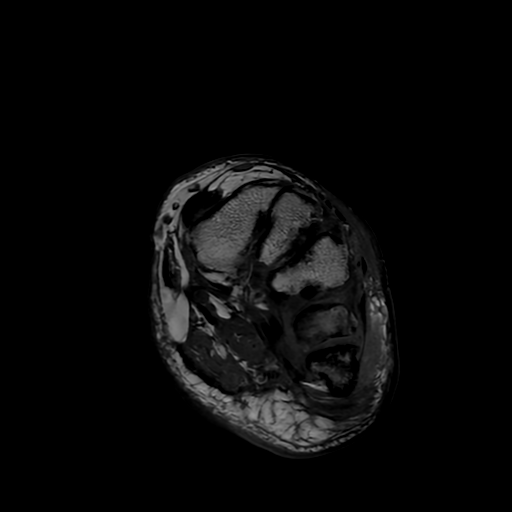

[Series 7: STIR · oblique · 3.0mm · 0.31mm/px · 3 of 31 slices shown]
[im 6/31]
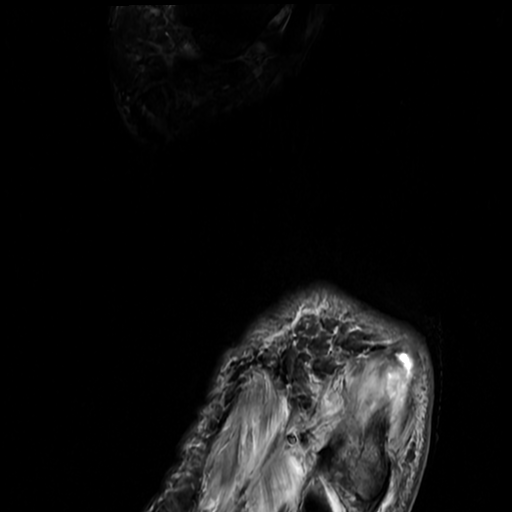
[im 16/31]
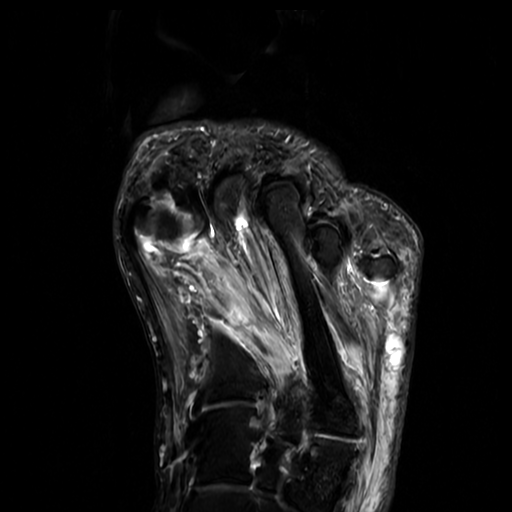
[im 26/31]
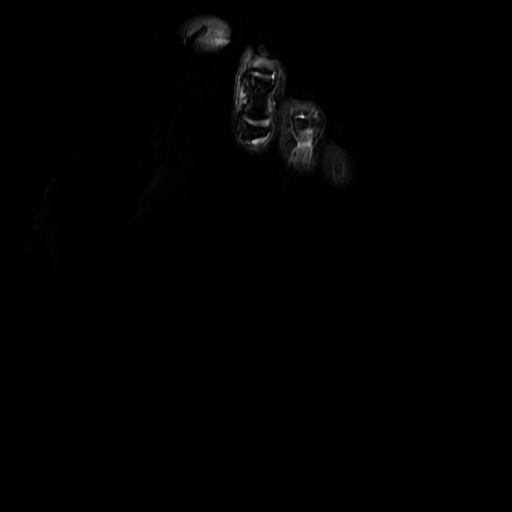

[Series 8: T1 · oblique · 3.0mm · 0.31mm/px · 3 of 31 slices shown (2 of 2)]
[im 6/31]
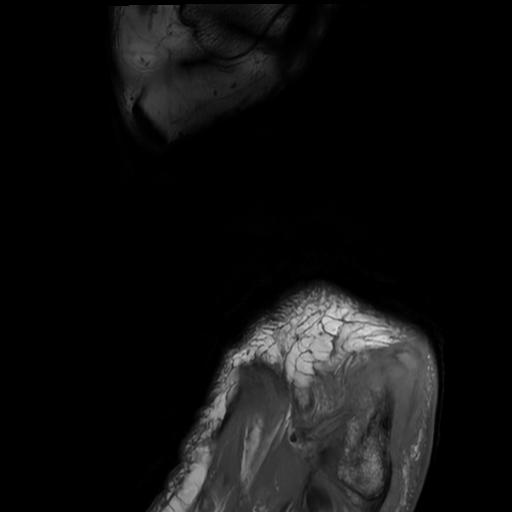
[im 16/31]
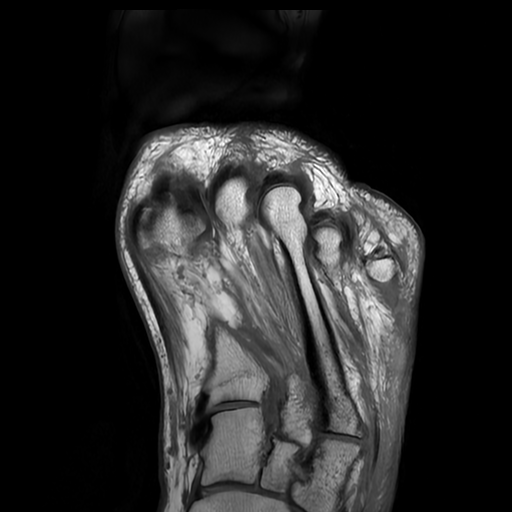
[im 26/31]
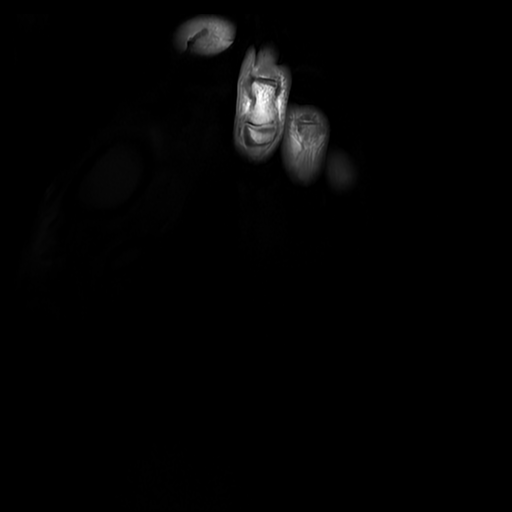

[18 of 40 positions shown; findings below may reference images not displayed]

FINDINGS: Bones/Joint/Cartilage

Mildly angulated nondisplaced fractures of the midshaft of the
fourth and fifth metatarsals are noted. There is periosteal reaction
seen along the medial margins of the metatarsal shafts. Increased
heterogeneous T2 hyperintense signal with subtle T1 hypointensity
seen at the base of the fourth and fifth metatarsals. No other
osseous marrow signal abnormality is seen. There is no large knee
joint effusions noted. The articular surfaces appear to be
maintained.

Ligaments

The Lisfranc ligament is intact.

Muscles and Tendons

Increased feathery signal with atrophy is seen throughout the
muscles of the forefoot. There is a small amount of fluid seen
surrounding the posterior tibialis tendon. The remainder of the
flexor and extensor tendons are intact.

Soft tissues

Lateral plantar surface of the forefoot overlying the fifth
metatarsal base there is a focal area of ulceration measuring
approximately 8 mm in transverse dimension. A fluid-filled sinus
tract is seen extending to the overlying osseous surface. There is a
multilocular fluid collection which extends around the dorsal
surface of the fifth metatarsal measuring approximately 5.4 x 0.9 x
3.4 cm. There is extensive dorsal and lateral subcutaneous edema and
skin thickening.
IMPRESSION: Superficial area of ulceration over the plantar lateral aspect of
the fifth metatarsal base with a fluid-filled sinus tract and
loculated probable abscess extending over the dorsal surface of the
fifth metatarsal measuring 5.4 x 0.9 by is a 3.4 cm.

Incomplete mildly angulated fourth and fifth metatarsal shaft
fractures with periosteal reaction

Findings which could be suggestive of reactive marrow versus early
osteomyelitis involving the base of the fourth and fifth
metatarsals.

## 2019-10-25 IMAGING — MR MR HEAD W/ CM
4 of 5 series · 12 of 48 positions shown · IV contrast (Yes GAD)
Comparison: Brain MRI without contrast [DATE]

CLINICAL DATA: Brain mass follow-up

EXAM:
MRI HEAD WITH CONTRAST
TECHNIQUE: Multiplanar, multiecho pulse sequences of the brain and surrounding
structures were obtained with intravenous contrast.
CONTRAST:  10mL GADAVIST GADOBUTROL 1 MMOL/ML IV SOLN

[Series 2: ax 3(person_name) pre · axial · non-contrast · 3.0mm · 0.94mm/px · z∈[-56,+44]mm · 3 of 52 slices shown]
[im 9/52]
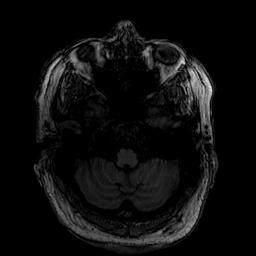
[im 26/52]
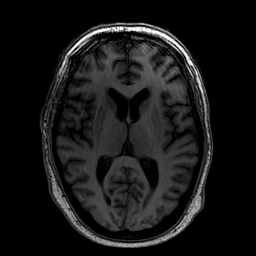
[im 43/52]
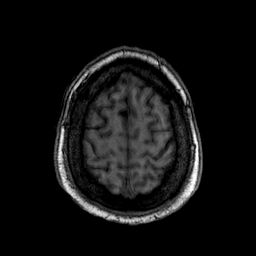

[Series 3: T2 post-contrast · coronal · 5.0mm · 0.39mm/px · 3 of 31 slices shown]
[im 5/31]
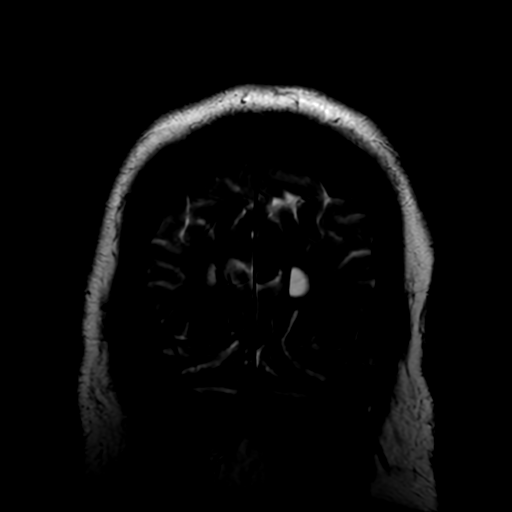
[im 18/31]
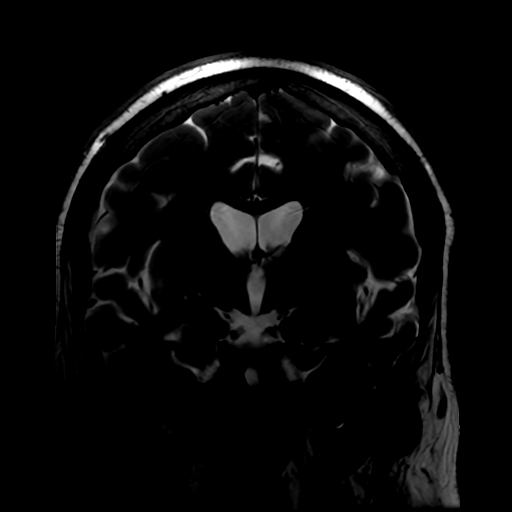
[im 26/31]
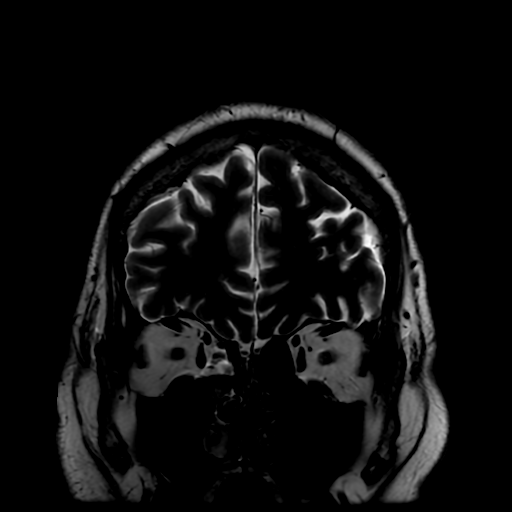

[Series 5: T1 · coronal · 5.0mm · 0.39mm/px · 3 of 31 slices shown]
[im 5/31]
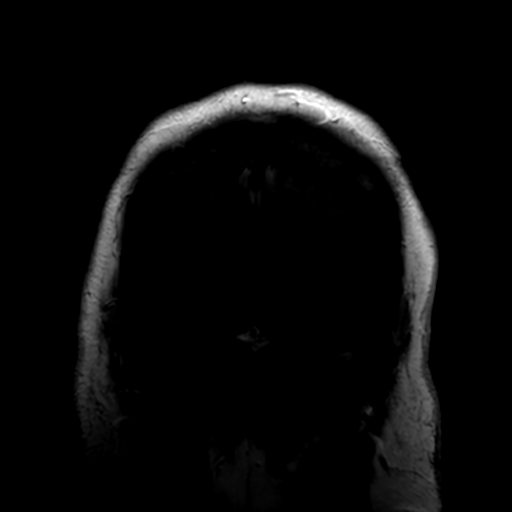
[im 18/31]
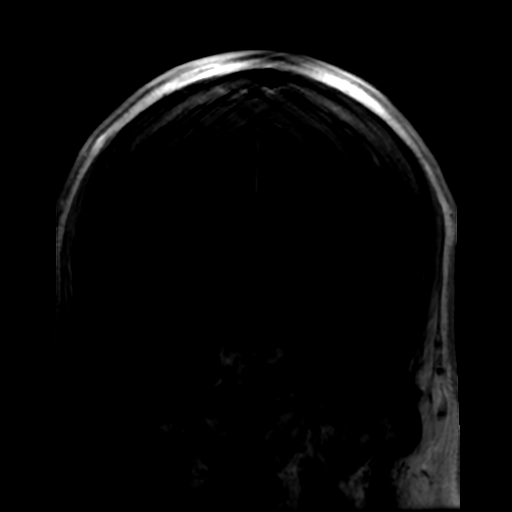
[im 26/31]
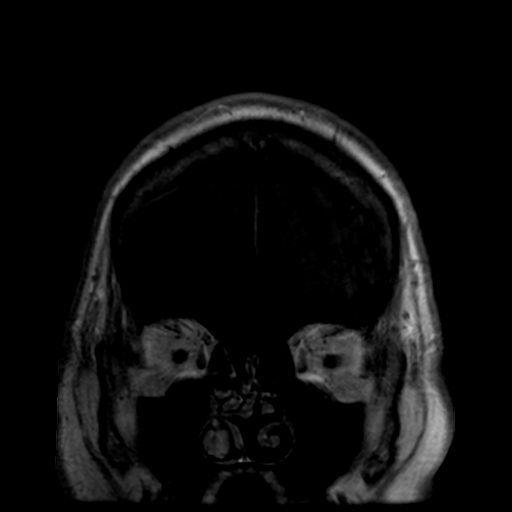

[Series 6: FLAIR post-contrast · sagittal · 5.0mm · 0.23mm/px · 3 of 24 slices shown]
[im 5/24]
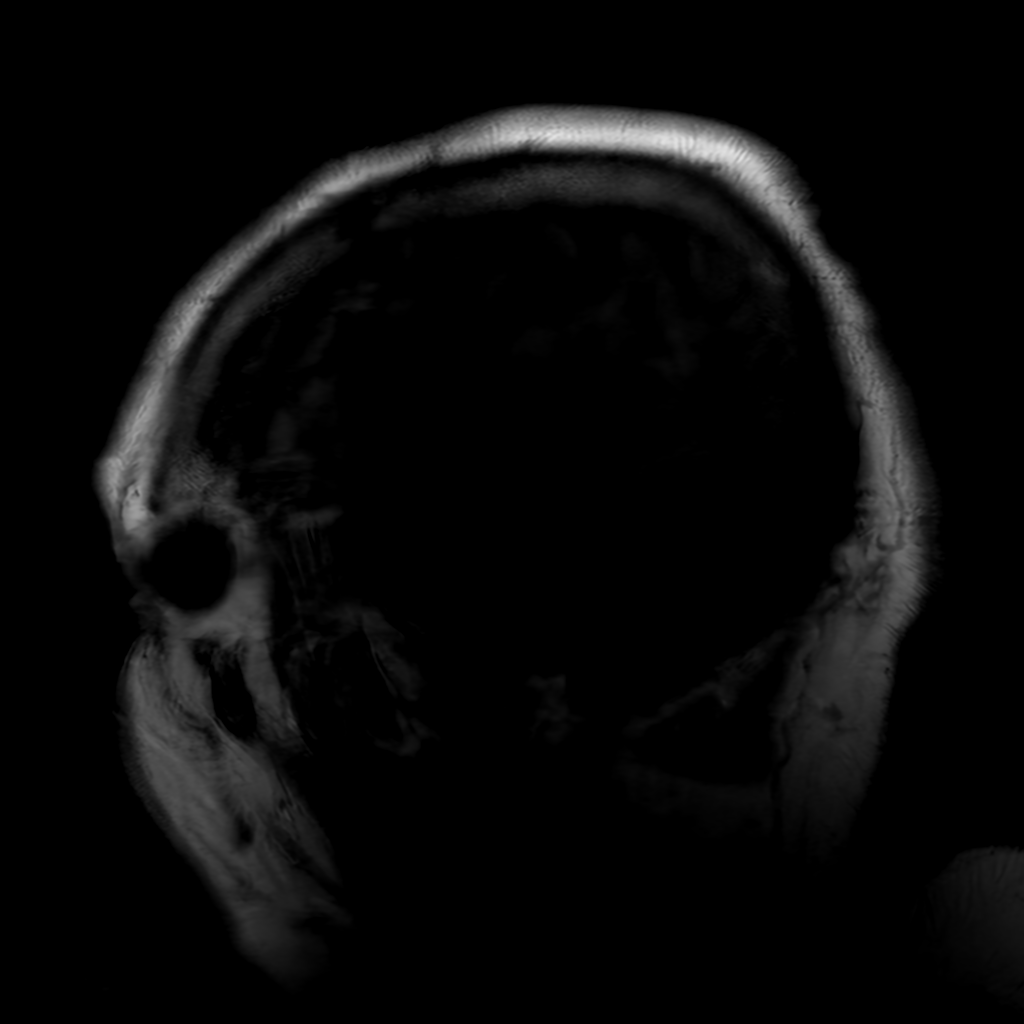
[im 14/24]
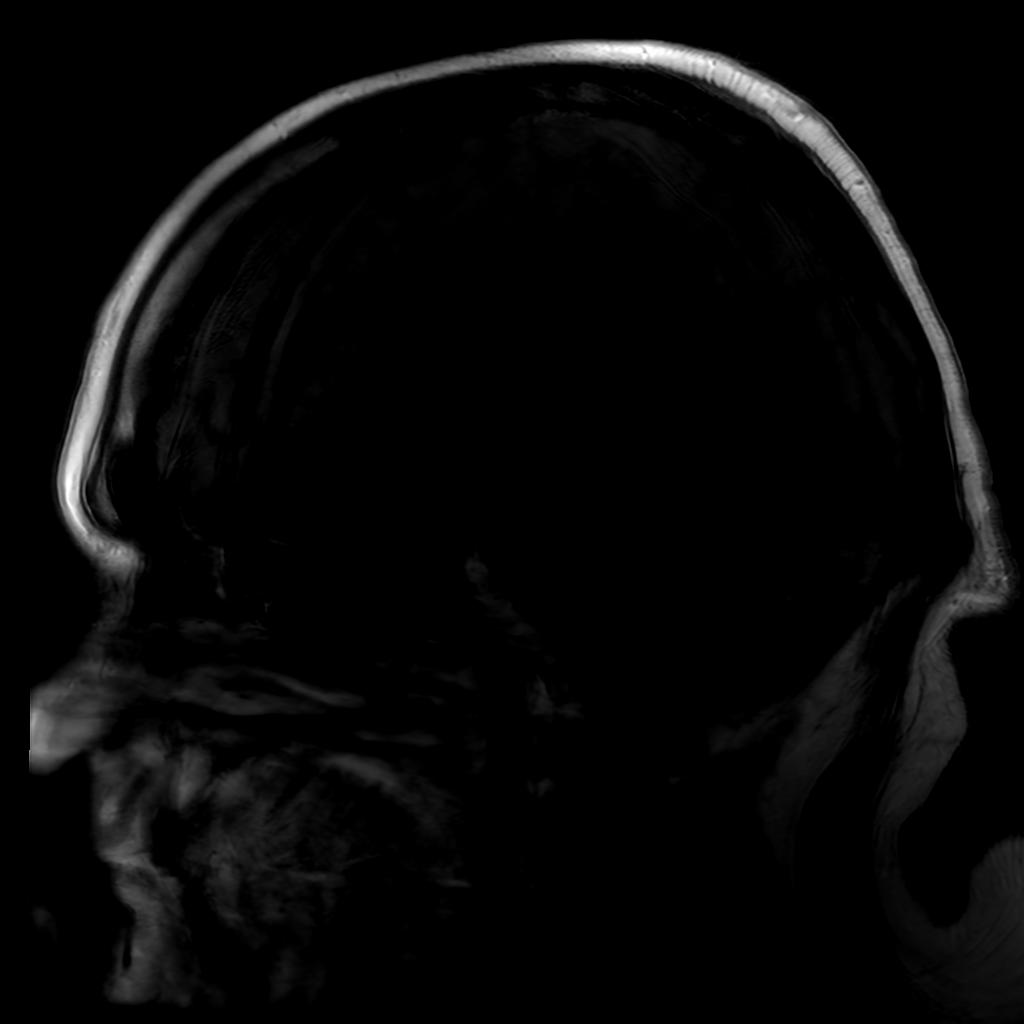
[im 24/24]
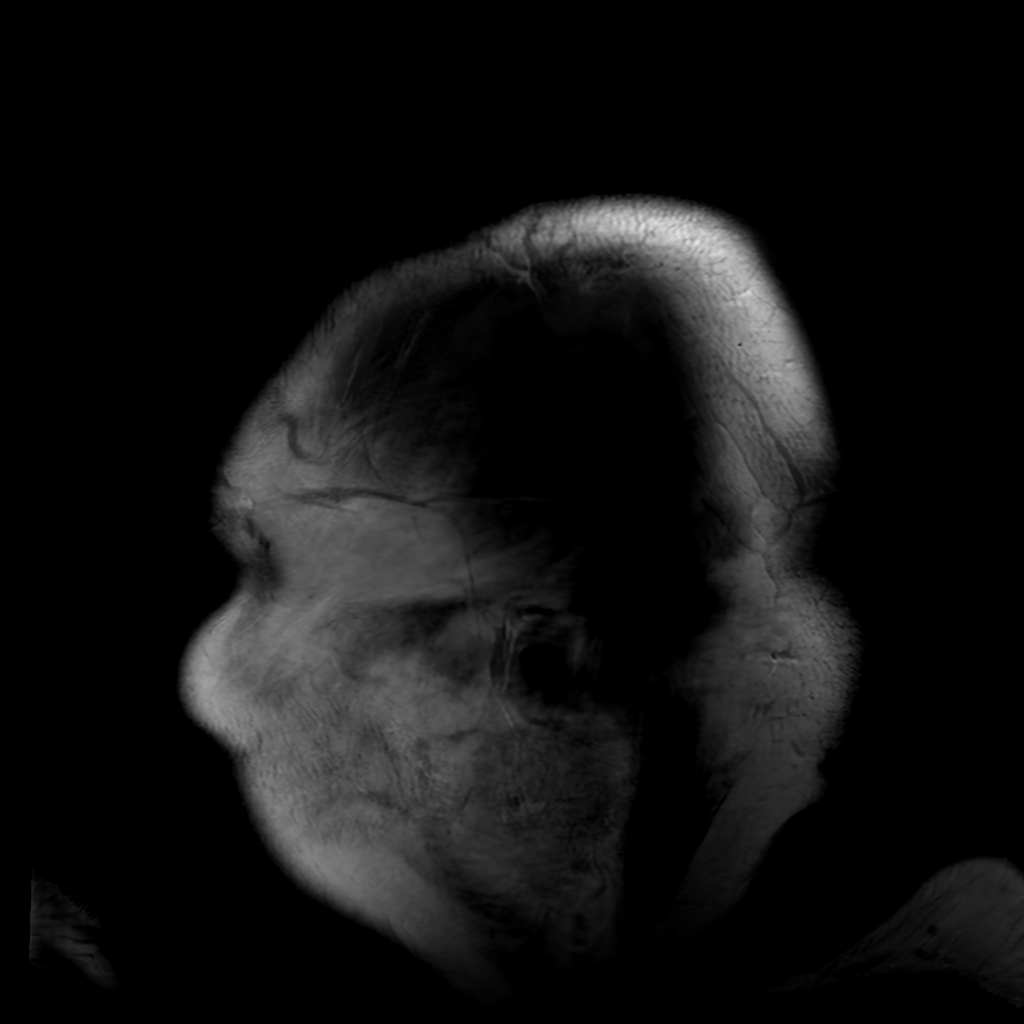

[12 of 48 positions shown; findings below may reference images not displayed]

FINDINGS: Brain: There is a peripherally enhancing lesion at the base of the
left precentral gyrus that measures 0.9 x 0.7 x 1.8 cm. There are no
other areas of abnormal contrast enhancement.
IMPRESSION: Peripherally enhancing lesion at the base of the left precentral
gyrus, favored to indicate cerebritis in this bacteremic patient.

## 2019-10-25 IMAGING — MR MR HEAD W/O CM
6 of 11 series · 24 of 48 positions shown · non-contrast
Comparison: Head CT [DATE].

CLINICAL DATA: Neuro deficit, acute, stroke suspected. Additional
history provided: COVID positive. Additional history obtained from
electronic medical record: Sepsis with MSSA and group B strep
bacteremia.

EXAM:
MRI HEAD WITHOUT CONTRAST
TECHNIQUE: Multiplanar, multiecho pulse sequences of the brain and surrounding
structures were obtained without intravenous contrast.

[Series 2: DWI · axial · 3.0mm · 0.94mm/px · z∈[-6,+140]mm · 7 of 102 slices shown (1 of 2)]
[im 1/102]
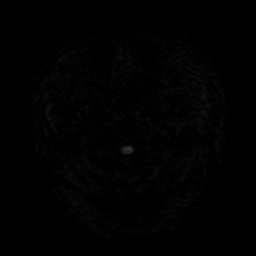
[im 17/102]
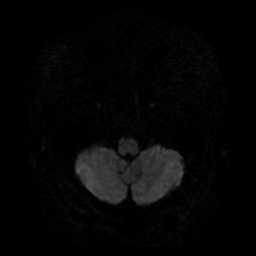
[im 34/102]
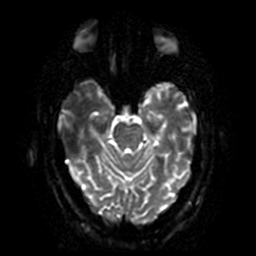
[im 51/102]
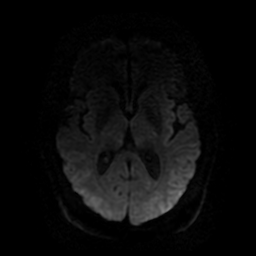
[im 68/102]
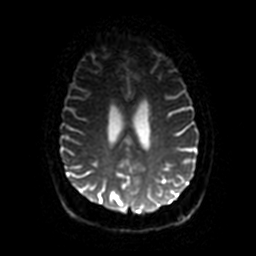
[im 85/102]
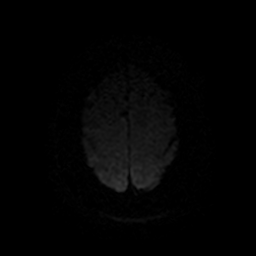
[im 102/102]
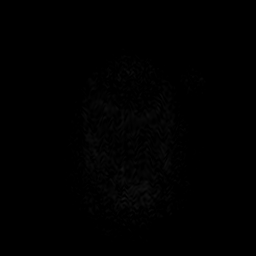

[Series 3: DWI · coronal · 4.0mm · 0.94mm/px · 6 of 82 slices shown (2 of 2)]
[im 1/82]
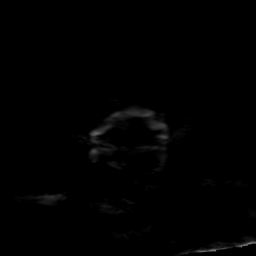
[im 17/82]
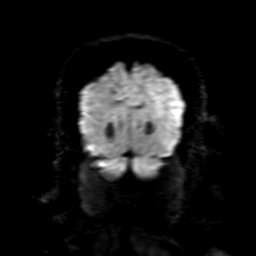
[im 33/82]
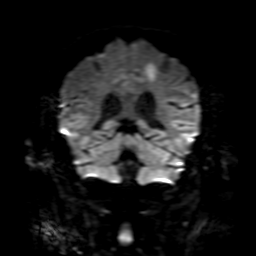
[im 49/82]
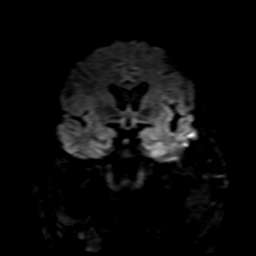
[im 65/82]
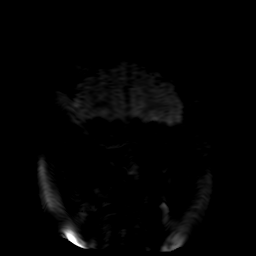
[im 82/82]
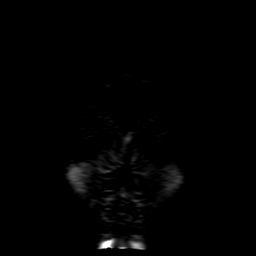

[Series 4: FLAIR · sagittal · 5.0mm · 0.23mm/px · 2 of 23 slices shown (1 of 2)]
[im 1/23]
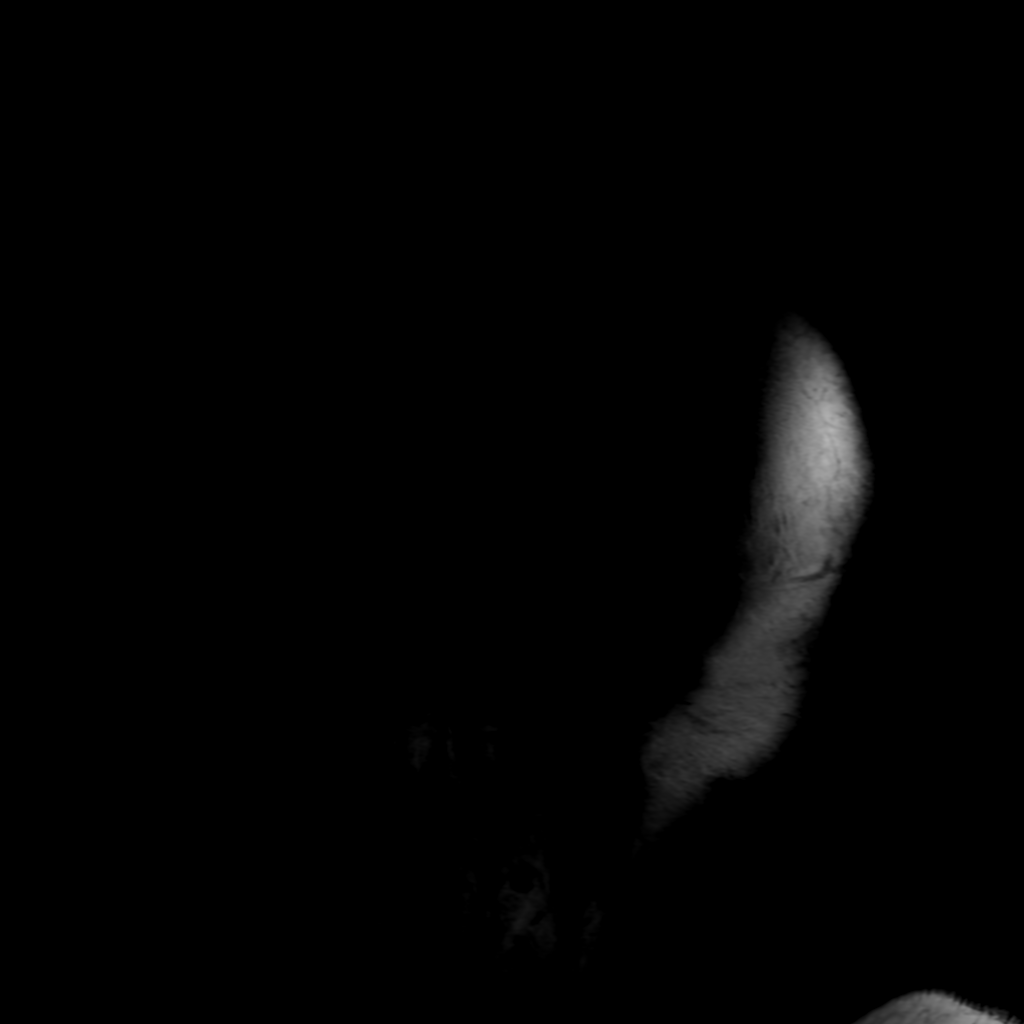
[im 23/23]
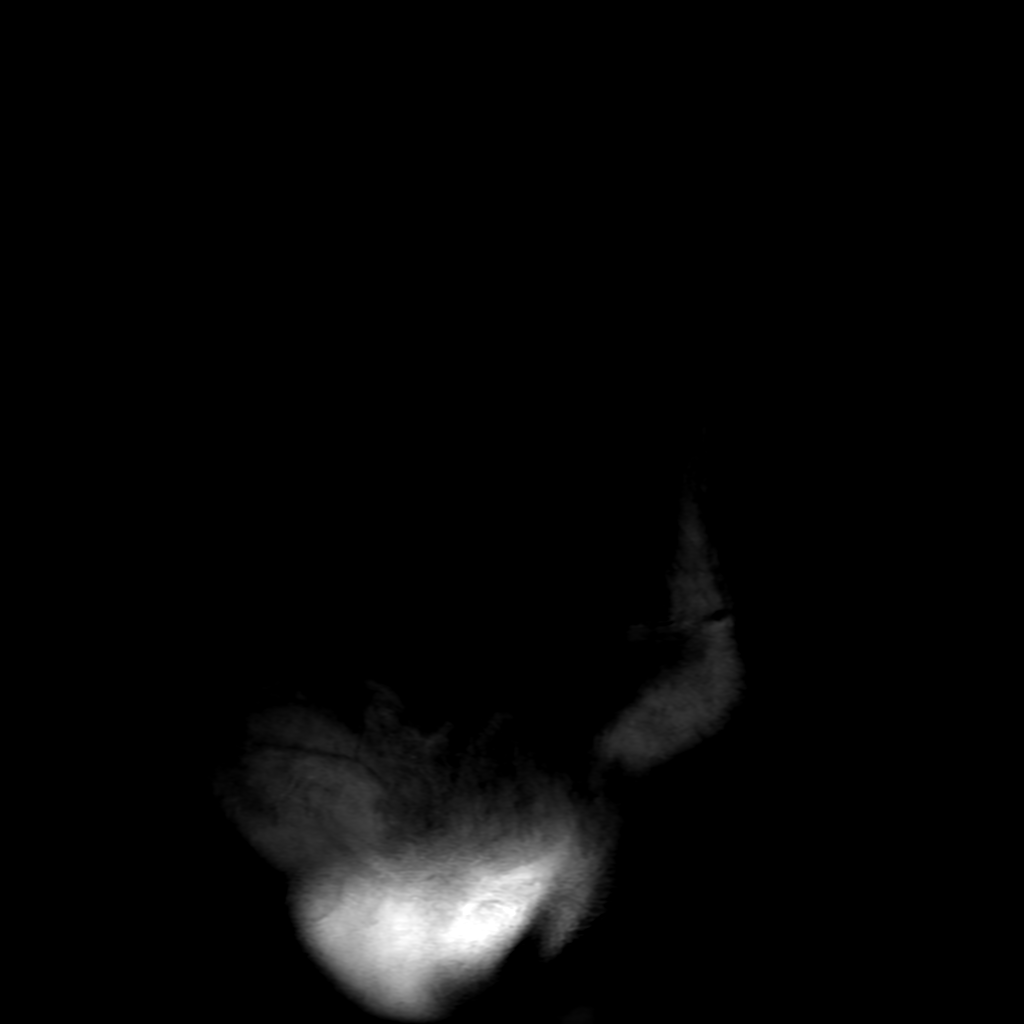

[Series 6: FLAIR · axial · 3.0mm · 0.47mm/px · z∈[-16,+130]mm · 2 of 26 slices shown (2 of 2)]
[im 1/26]
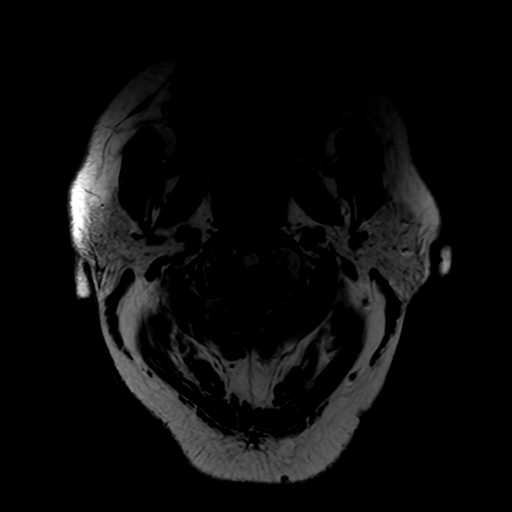
[im 26/26]
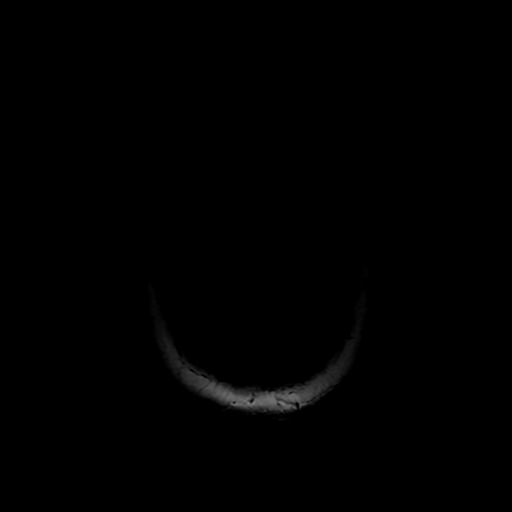

[Series 250: ADC · axial · 3.0mm · 0.94mm/px · z∈[-6,+140]mm · 4 of 51 slices shown (1 of 2)]
[im 1/51]
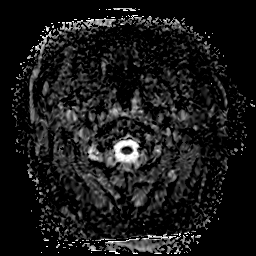
[im 17/51]
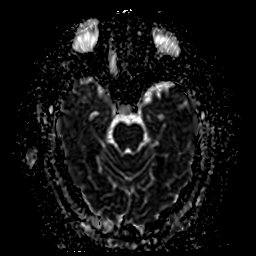
[im 34/51]
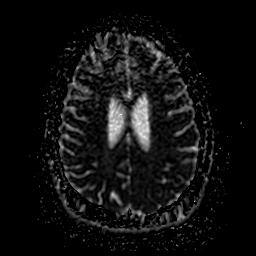
[im 51/51]
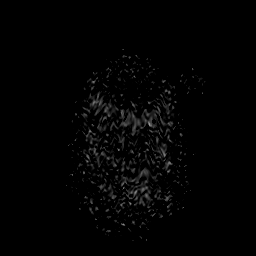

[Series 350: ADC · coronal · 4.0mm · 0.94mm/px · 3 of 41 slices shown (2 of 2)]
[im 1/41]
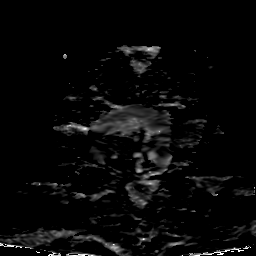
[im 21/41]
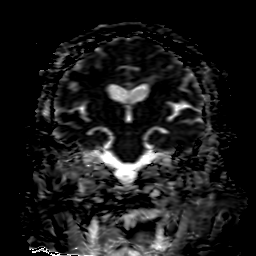
[im 41/41]
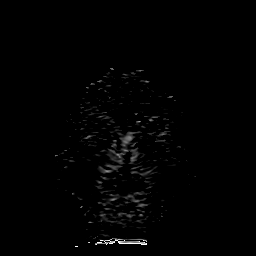

[24 of 48 positions shown; findings below may reference images not displayed]

FINDINGS: Brain:

The examination is limited by poor signal on several sequences as
well as motion degradation. Most notably, there is moderate motion
degradation of the axial T2 weighted sequence, severe motion
degradation of the axial SWI sequence and moderate motion
degradation of the coronal T2 weighted sequence.

Cerebral volume is normal for age.

There is a 17 mm focus of restricted diffusion and corresponding
T2/FLAIR hyperintensity within the subcortical left frontoparietal
lobes (for instance as seen on series 2, image 39) (series 3, image
17).

Additional mild scattered T2/FLAIR hyperintensity within the
cerebral white matter and pons is nonspecific, but consistent with
chronic small vessel ischemic disease.

Severe motion degradation of the axial SWI sequence precludes
adequate evaluation for intracranial blood products.

No extra-axial fluid collection.

No midline shift.

Vascular: Expected proximal arterial flow voids.

Skull and upper cervical spine: No focal marrow lesion is identified
within described limitations.

Sinuses/Orbits: Visualized orbits show no acute finding. Air-fluid
levels within the sphenoid and right maxillary sinuses. Partial
opacification of right ethmoid air cells. Bilateral mastoid
effusions.

These results will be called to the ordering clinician or
representative by the Radiologist Assistant, and communication
documented in the PACS or [REDACTED].
IMPRESSION: Examination limited by poor signal on several sequences as well as
motion degradation, as described.

17 mm focus of restricted diffusion and T2/FLAIR hyperintensity
within the subcortical left frontoparietal lobes. This finding is
nonspecific, but given the patient's history, the primary
differential considerations are acute/early subacute infarct versus
cerebritis. Consider contrast-enhanced MR imaging of the brain for
further evaluation of this lesion.

Mild chronic small vessel ischemic disease.

Paranasal sinus disease with air-fluid levels.

Bilateral mastoid effusions.

## 2019-10-25 MED ORDER — GADOBUTROL 1 MMOL/ML IV SOLN
10.0000 mL | Freq: Once | INTRAVENOUS | Status: AC | PRN
  Administered 2019-10-25: 10 mL via INTRAVENOUS

## 2019-10-25 MED ORDER — ALTEPLASE 2 MG IJ SOLR
2.0000 mg | Freq: Once | INTRAMUSCULAR | Status: AC
  Administered 2019-10-25: 2 mg
  Filled 2019-10-25: qty 2

## 2019-10-25 MED ORDER — PANTOPRAZOLE SODIUM 40 MG PO TBEC
40.0000 mg | DELAYED_RELEASE_TABLET | Freq: Every day | ORAL | Status: DC
  Administered 2019-10-25 – 2019-11-08 (×15): 40 mg via ORAL
  Filled 2019-10-25 (×15): qty 1

## 2019-10-25 MED ORDER — LOSARTAN POTASSIUM 50 MG PO TABS
25.0000 mg | ORAL_TABLET | Freq: Every day | ORAL | Status: DC
  Administered 2019-10-25 – 2019-10-26 (×2): 25 mg via ORAL
  Filled 2019-10-25 (×2): qty 1

## 2019-10-25 MED ORDER — DOCUSATE SODIUM 100 MG PO CAPS
100.0000 mg | ORAL_CAPSULE | Freq: Two times a day (BID) | ORAL | Status: DC
  Administered 2019-10-26 – 2019-11-15 (×15): 100 mg via ORAL
  Filled 2019-10-25 (×36): qty 1

## 2019-10-25 MED ORDER — ENSURE ENLIVE PO LIQD
237.0000 mL | Freq: Three times a day (TID) | ORAL | Status: DC
  Administered 2019-10-25 – 2019-11-05 (×21): 237 mL via ORAL

## 2019-10-25 MED ORDER — BUPROPION HCL ER (XL) 150 MG PO TB24
150.0000 mg | ORAL_TABLET | Freq: Every day | ORAL | Status: DC
  Administered 2019-10-25 – 2019-11-15 (×21): 150 mg via ORAL
  Filled 2019-10-25 (×22): qty 1

## 2019-10-25 MED ORDER — PREDNISONE 10 MG PO TABS
10.0000 mg | ORAL_TABLET | Freq: Every day | ORAL | Status: DC
  Administered 2019-10-26 – 2019-10-29 (×4): 10 mg via ORAL
  Filled 2019-10-25: qty 2
  Filled 2019-10-25 (×2): qty 1

## 2019-10-25 MED ORDER — POTASSIUM CHLORIDE CRYS ER 20 MEQ PO TBCR
40.0000 meq | EXTENDED_RELEASE_TABLET | Freq: Once | ORAL | Status: AC
  Administered 2019-10-25: 40 meq via ORAL
  Filled 2019-10-25: qty 2

## 2019-10-25 MED ORDER — POLYETHYLENE GLYCOL 3350 17 G PO PACK
17.0000 g | PACK | Freq: Every day | ORAL | Status: DC
  Administered 2019-10-26 – 2019-10-31 (×5): 17 g via ORAL
  Filled 2019-10-25 (×18): qty 1

## 2019-10-25 MED ORDER — CLOPIDOGREL BISULFATE 75 MG PO TABS: 75.0000 mg | ORAL_TABLET | Freq: Every day | ORAL | Status: DC

## 2019-10-25 MED ORDER — MAGNESIUM SULFATE IN D5W 1-5 GM/100ML-% IV SOLN
1.0000 g | Freq: Once | INTRAVENOUS | Status: AC
  Administered 2019-10-25: 1 g via INTRAVENOUS
  Filled 2019-10-25: qty 100

## 2019-10-25 MED ORDER — PROSOURCE PLUS PO LIQD
30.0000 mL | Freq: Two times a day (BID) | ORAL | Status: DC
  Administered 2019-10-26 – 2019-10-30 (×7): 30 mL via ORAL
  Filled 2019-10-25 (×8): qty 30

## 2019-10-25 MED ORDER — SENNA 8.6 MG PO TABS
2.0000 | ORAL_TABLET | Freq: Two times a day (BID) | ORAL | Status: DC
  Administered 2019-10-26 – 2019-11-15 (×15): 17.2 mg via ORAL
  Filled 2019-10-25 (×36): qty 2

## 2019-10-25 MED ORDER — STROKE: EARLY STAGES OF RECOVERY BOOK
Freq: Once | Status: AC
  Filled 2019-10-25: qty 1

## 2019-10-25 NOTE — Progress Notes (Signed)
PROGRESS NOTE                                                                                                                                                                                                             Patient Demographics:    Micheal Bell, is a 49 y.o. male, DOB - 08-Oct-1970, FXJ:[id number]  Outpatient Primary MD for the patient is [name], MD    LOS - 13  Admit date - 10/09/2019    Chief Complaint  Patient presents with  . Cough       Brief Narrative - 49 yo male with hx of HTN, T2DM, gout, IBS, fibromyalgia, obesity who presented on 8/16 for COVID pneumonia.  He decompensated on 8/19 and was intubated.  Self extubated on 8/23 and was reintubated.  He's subsequently been extubated on 8/28.  He's been on typical therapies for covid including steroids, baricitinib and remdesivir.  His hospitalization was complicated by MSSA and GBS bacteria.  Also found to have systolic heart failure.  Transferred to [facility] on 10/25/19   Subjective:    Micheal Bell today has, No headache, No chest pain, No abdominal pain - No Nausea, No new weakness tingling or numbness, no SOB.     Assessment  & Plan :     1. Acute Hypoxic Resp. Failure due to Acute Covid 19 Viral Pneumonitis during the ongoing 2020 Covid 19 Pandemic - he had severe hypoxia and parenchymal lung injury he was intubated and admitted by ICU, he was started on IV steroids, remdesivir and Baricitinib on 10/12/2019.  He was subsequently extubated on 10/21/2019.  His stay was complicated by bacteremia after which Baricitinib was discontinued on 10/24/2019.  His pulmonary status is gradually improving.  Encouraged the patient to sit up in chair in the daytime use I-S and flutter valve for pulmonary toiletry and then prone in bed when at night.  Will advance activity and titrate down oxygen as possible.   SpO2: 97 % O2 Flow Rate (L/min): 3 L/min FiO2 (%):  97 %  Recent Labs  Lab 10/21/19 0458 10/21/19 1517 10/22/19 0450 10/23/19 0352 10/23/19 1058 10/23/19 1119 10/23/19 1206 10/23/19 1659 10/24/19 0130 10/25/19 0352 10/25/19 0353  WBC 15.3*  --  14.3* 18.7*  --   --   --   --  18.1* 15.5*  --  PLT 368  --  319 523*  --   --   --   --  554* 506*  --   CRP  --   --   --   --   --   --   --   --  5.1* 3.7*  --   BNP  --   --   --   --   --   --   --   --   --   --  34.4  DDIMER  --   --   --   --  5.19*  --   --   --  3.55* 5.12*  --   PROCALCITON  --   --   --   --   --  <0.10  --   --  <0.10  --   --   AST  --   --   --   --   --   --   --   --  34 30  --   ALT  --   --   --   --   --   --   --   --  31 25  --   ALKPHOS  --   --   --   --   --   --   --   --  122 105  --   BILITOT  --   --   --   --   --   --   --   --  0.3 0.4  --   ALBUMIN  --   --   --   --   --   --   --   --  2.3* 2.5*  --   LATICACIDVEN  --  1.1  --   --   --   --  2.3* 1.1  --   --   --     2.  Sepsis with MSSA and Group B strep bacteremia.  ID following, currently on IV Ancef.  Source is not clear however TEE is negative, awaiting MRI of his feet as he has Charcot's joint and history of injury to his left foot with a sharp penetrating object prior to admission.  Sepsis pathophysiology has resolved.  ID following.  3.  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  Does have right-sided weakness also family with me that he has bilateral rotator cuff injury.  For now we will place him on statin for secondary prevention, he has anaphylactic allergy to aspirin related products, PT OT and speech eval.  Monitor.  Once he is stable we will pursue MRI of the brain.  3.  Possible systolic heart failure as shown by TTE however this could have been transient due to sepsis, on repeat TEE his EF is now normalized to 50 to 55%.  Supportive care.  Seen by cardiology and currently on combination of ARB and beta-blocker.   Compensated.   4.  Cyanotic right lower extremity toes.  Likely due to hypoperfusion which is transient, arterial ultrasound stable.  5.  History of chronic pain.  On multiple narcotics, Flexeril and high-dose benzodiazepines.  Hold all due to metabolic encephalopathy which is severe.  6. HTN -stable on combination of ARB and beta-blocker.   7.  DM type II.  Currently on Lantus and sliding scale monitor.  Lab Results  Component Value Date   HGBA1C 11.6 (H)  10/10/2019   CBG (last 3)  Recent Labs    10/25/19 0326 10/25/19 0726 10/25/19 1144  GLUCAP 102* 122* 183*    Condition - Extremely Guarded  Family Communication  :  Sister and mother [phone] - 10/25/19  Code Status :  Full  Consults  :  PCCM, ID, Cards  Procedures  :    CT Head -  1. No acute intracranial abnormality. 2. Area of subcortical low-density involving the posterior left frontal lobe at the convexity, likely encephalomalacia and sequela of remote infarct. There are no prior exams for comparison to establish chronicity. MRI could be considered for further evaluation based on clinical concern. 3. Paranasal sinus fluid levels on opacification of the right greater than left mastoid air cells, possibly related to intubation.  R. Leg Arterial US -  No evidence of arterial occlusive disease. Normal waveforms, no stenosis  TTE -  1. Extremely poor acoustic windows. LVEF appears to be depressed with diffuse hypokinesis, worse in the mid/distal inferior/inferoseptal walls. . Left ventricular ejection fraction, by estimation, is 30 to 35%. The left ventricle has moderately decreased function. The left ventricular internal cavity size was mildly dilated. Left ventricular diastolic parameters are indeterminate.  2. Right ventricular systolic function is moderately reduced. The right ventricular size is mildly enlarged.  3. The mitral valve is normal in structure. Trivial mitral valve regurgitation.  4. The aortic valve is  normal in structure. Aortic valve regurgitation is not visualized  TEE - 1. Left ventricular ejection fraction, by estimation, is 50 to 55%. The left ventricle has low normal function. The left ventricle has no regional wall motion abnormalities.  2. Right ventricular systolic function is normal. The right ventricular size is normal.  3. No left atrial/left atrial appendage thrombus was detected.  4. The mitral valve is normal in structure. Trivial mitral valve regurgitation. No evidence of mitral stenosis.  5. The aortic valve is normal in structure. Aortic valve regurgitation is not visualized. No aortic stenosis is present.  6. The inferior vena cava is normal in size with greater than 50% respiratory variability, suggesting right atrial pressure of 3 mmHg.  CT - 1. No acute intracranial abnormality. 2. Area of subcortical low-density involving the posterior left frontal lobe at the convexity, likely encephalomalacia and sequela of remote infarct. There are no prior exams for comparison to establish chronicity. MRI could be considered for further evaluation based on clinical concern. 3. Paranasal sinus fluid levels on opacification of the right greater than left mastoid air cells, possibly related to intubation    PUD Prophylaxis : PPI  Disposition Plan  :    Status is: Inpatient  Remains inpatient appropriate because:IV treatments appropriate due to intensity of illness or inability to take PO   Dispo: The patient is from: Home              Anticipated d/c is to: Home              Anticipated d/c date is: 3 days              Patient currently is not medically stable to d/c.   DVT Prophylaxis  :  Lovenox    Lab Results  Component Value Date   PLT 506 (H) 10/25/2019    Diet :  Diet Order            Diet heart healthy/carb modified Room service appropriate? Yes with Assist; Fluid consistency: Thin  Diet effective now  Inpatient Medications  Scheduled  Meds: . buPROPion  150 mg Oral Daily  . chlorhexidine gluconate (MEDLINE KIT)  15 mL Mouth Rinse BID  . Chlorhexidine Gluconate Cloth  6 each Topical Daily  . docusate  100 mg Per Tube BID  . enoxaparin (LOVENOX) injection  60 mg Subcutaneous Daily  . famotidine  20 mg Per Tube BID  . feeding supplement (PROSource TF)  45 mL Per Tube BID  . insulin aspart  0-15 Units Subcutaneous Q4H  . insulin glargine  20 Units Subcutaneous BID  . linagliptin  5 mg Oral Daily  . losartan  25 mg Oral Daily  . metoprolol succinate  25 mg Oral Daily  . pantoprazole  40 mg Oral Daily  . polyethylene glycol  17 g Per Tube Daily  . [START ON 10/26/2019] predniSONE  10 mg Oral Q breakfast  . sennosides  10 mL Per Tube BID  . sodium chloride flush  10-40 mL Intracatheter Q12H   Continuous Infusions: . sodium chloride Stopped (10/12/19 1606)  .  ceFAZolin (ANCEF) IV 2 g (10/25/19 1315)  . feeding supplement (VITAL 1.5 CAL) Stopped (10/24/19 0000)   PRN Meds:.sodium chloride, acetaminophen, bisacodyl, ipratropium-albuterol  Antibiotics  :    Anti-infectives (From admission, onward)   Start     Dose/Rate Route Frequency Ordered Stop   10/12/19 2200  cefTRIAXone (ROCEPHIN) 2 g in sodium chloride 0.9 % 100 mL IVPB  Status:  Discontinued        2 g 200 mL/hr over 30 Minutes Intravenous Every 24 hours 10/12/19 0441 10/12/19 0921   10/12/19 0930  ceFAZolin (ANCEF) IVPB 2g/100 mL premix        2 g 200 mL/hr over 30 Minutes Intravenous Every 8 hours 10/12/19 0921     10/11/19 1000  remdesivir 100 mg in sodium chloride 0.9 % 100 mL IVPB       "Followed by" Linked Group Details   100 mg 200 mL/hr over 30 Minutes Intravenous Daily 10/10/19 0050 10/14/19 0956   10/11/19 0800  vancomycin (VANCOCIN) IVPB 1000 mg/200 mL premix  Status:  Discontinued        1,000 mg 200 mL/hr over 60 Minutes Intravenous Every 8 hours 10/10/19 2239 10/12/19 0921   10/10/19 2230  vancomycin (VANCOCIN) IVPB 1000 mg/200 mL premix         1,000 mg 200 mL/hr over 60 Minutes Intravenous Every 1 hr x 2 10/10/19 2208 10/11/19 0052   10/10/19 2215  cefTRIAXone (ROCEPHIN) 2 g in sodium chloride 0.9 % 100 mL IVPB  Status:  Discontinued        2 g 200 mL/hr over 30 Minutes Intravenous Every 24 hours 10/10/19 2205 10/12/19 0441   10/10/19 0100  remdesivir 100 mg in sodium chloride 0.9 % 100 mL IVPB       "Followed by" Linked Group Details   100 mg 200 mL/hr over 30 Minutes Intravenous Every 30 min 10/10/19 0050 10/10/19 0332       Time Spent in minutes  30   Lala Lund M.D on 10/25/2019 at 1:54 PM  To page go to [email or web address] - password [facility]  [facility] -  Office  [phone]   See all Orders from today for further details    Objective:   Vitals:   10/25/19 0408 10/25/19 0435 10/25/19 0726 10/25/19 1146  BP:   (!) 143/77 129/76  Pulse:   88   Resp:   20 20  Temp:   97.8 F (36.6  C) 98 F (36.7 C)  TempSrc:   Oral Oral  SpO2:  91% 96% 97%  Weight: 112.5 kg     Height:        Wt Readings from Last 3 Encounters:  10/25/19 112.5 kg  09/11/19 111.1 kg  05/12/17 (!) 145.2 kg     Intake/Output Summary (Last 24 hours) at 10/25/2019 1354 Last data filed at 10/25/2019 0533 Gross per 24 hour  Intake 300 ml  Output 600 ml  Net -300 ml     Physical Exam  Awake but confused, right-sided weakness, deltoid weakness in both extremities,  [place].AT,PERRAL Supple Neck,No JVD, No cervical lymphadenopathy appriciated.  Symmetrical Chest wall movement, Good air movement bilaterally, CTAB RRR,No Gallops,Rubs or new Murmurs, No Parasternal Heave +ve B.Sounds, Abd Soft, No tenderness, No organomegaly appriciated, No rebound - guarding or rigidity. No Cyanosis, Clubbing or edema, No new Rash or bruise      Data Review:    CBC Recent Labs  Lab 10/21/19 0458 10/22/19 0450 10/23/19 0352 10/24/19 0130 10/25/19 0352  WBC 15.3* 14.3* 18.7* 18.1* 15.5*  HGB 12.2* 11.6* 13.0 12.3* 13.2  HCT 39.4 36.7* 40.9  39.2 40.1  PLT 368 319 523* 554* 506*  MCV 97.3 94.1 93.8 94.9 94.1  MCH 30.1 29.7 29.8 29.8 31.0  MCHC 31.0 31.6 31.8 31.4 32.9  RDW 11.9 11.6 11.9 12.2 12.4  LYMPHSABS  --   --   --  3.8 2.4  MONOABS  --   --   --  1.1* 0.8  EOSABS  --   --   --  0.1 0.1  BASOSABS  --   --   --  0.1 0.1    Recent Labs  Lab 10/21/19 0458 10/21/19 1517 10/22/19 0450 10/23/19 0352 10/23/19 1058 10/23/19 1119 10/23/19 1206 10/23/19 1659 10/24/19 0130 10/25/19 0352 10/25/19 0353  NA 143  --  138 139  --   --   --   --  143 141  --   K 3.8  --  3.3* 3.9  --   --   --   --  3.5 3.3*  --   CL 98  --  98 99  --   --   --   --  102 102  --   CO2 34*  --  30 28  --   --   --   --  29 27  --   GLUCOSE 203*  --  185* 217*  --   --   --   --  82 100*  --   BUN 27*  --  19 21*  --   --   --   --  22* 17  --   CREATININE 0.65  --  0.53* 0.66  --   --   --   --  0.76 0.69  --   CALCIUM 8.7*  --  8.1* 9.0  --   --   --   --  9.2 9.1  --   AST  --   --   --   --   --   --   --   --  34 30  --   ALT  --   --   --   --   --   --   --   --  31 25  --   ALKPHOS  --   --   --   --   --   --   --   --  122 105  --   BILITOT  --   --   --   --   --   --   --   --  0.3 0.4  --   ALBUMIN  --   --   --   --   --   --   --   --  2.3* 2.5*  --   MG  --   --   --   --   --   --   --   --  2.0 1.7  --   CRP  --   --   --   --   --   --   --   --  5.1* 3.7*  --   DDIMER  --   --   --   --  5.19*  --   --   --  3.55* 5.12*  --   PROCALCITON  --   --   --   --   --  <0.10  --   --  <0.10  --   --   LATICACIDVEN  --  1.1  --   --   --   --  2.3* 1.1  --   --   --   TSH  --   --   --   --   --   --  1.422  --   --   --   --   AMMONIA  --   --   --   --   --   --  22  --   --   --   --   BNP  --   --   --   --   --   --   --   --   --   --  34.4    ------------------------------------------------------------------------------------------------------------------ No results for input(s): CHOL, HDL, LDLCALC, TRIG, CHOLHDL,  LDLDIRECT in the last 72 hours.  Lab Results  Component Value Date   HGBA1C 11.6 (H) 10/10/2019   ------------------------------------------------------------------------------------------------------------------ Recent Labs    10/23/19 1206  TSH 1.422    Cardiac Enzymes No results for input(s): CKMB, TROPONINI, MYOGLOBIN in the last 168 hours.  Invalid input(s): CK ------------------------------------------------------------------------------------------------------------------    Component Value Date/Time   BNP 34.4 10/25/2019 0353    Micro Results Recent Results (from the past 240 hour(s))  MRSA PCR Screening     Status: None   Collection Time: 10/17/19  8:51 AM   Specimen: Nasal Mucosa; Nasopharyngeal  Result Value Ref Range Status   MRSA by PCR NEGATIVE NEGATIVE Final    Comment:        The GeneXpert MRSA Assay (FDA approved for NASAL specimens only), is one component of a comprehensive MRSA colonization surveillance program. It is not intended to diagnose MRSA infection nor to guide or monitor treatment for MRSA infections. Performed at [facility], [place] [address] [place], [place] [address]   Culture, blood (routine x 2)     Status: None (Preliminary result)   Collection Time: 10/23/19 10:58 AM   Specimen: BLOOD  Result Value Ref Range Status   Specimen Description BLOOD LEFT ANTECUBITAL  Final   Special Requests   Final    BOTTLES DRAWN AEROBIC ONLY Blood Culture adequate volume   Culture   Final    NO GROWTH 2 DAYS Performed at [facility], [place] [address], [place], [place] [address]    Report Status PENDING  Incomplete  Culture, blood (routine x 2)     Status: None (Preliminary result)   Collection Time: 10/23/19 11:00 AM   Specimen: BLOOD LEFT HAND  Result Value Ref Range Status   Specimen Description BLOOD LEFT HAND  Final   Special Requests   Final    BOTTLES DRAWN AEROBIC ONLY Blood Culture adequate volume   Culture   Final     NO GROWTH 2 DAYS Performed at [facility], [address], [place], [place] [address]    Report Status PENDING  Incomplete    Radiology Reports CT HEAD WO CONTRAST  Result Date: 10/19/2019 CLINICAL DATA:  Deliriums.  Mental status change. EXAM: CT HEAD WITHOUT CONTRAST TECHNIQUE: Contiguous axial images were obtained from the base of the skull through the vertex without intravenous contrast. COMPARISON:  None. FINDINGS: Brain: Area subcortical low-density involving the posterior left frontal lobe at the convexity, series 3, image 25 and series 5, image 49, likely encephalomalacia and sequela of remote infarct, however nonspecific. No hemorrhage. No evidence of acute ischemia. No hydrocephalus, midline shift or mass effect. No subdural or extra-axial collection. Vascular: Atherosclerosis of skullbase vasculature without hyperdense vessel or abnormal calcification. Skull: No fracture or focal lesion. Sinuses/Orbits: Nasogastric tube in place. Fluid levels in the sphenoid sinuses and right maxillary sinus with scattered opacification of ethmoid air cells may be related to intubation. Opacification of the right greater than left mastoid air cells. Other: None. IMPRESSION: 1. No acute intracranial abnormality. 2. Area of subcortical low-density involving the posterior left frontal lobe at the convexity, likely encephalomalacia and sequela of remote infarct. There are no prior exams for comparison to establish chronicity. MRI could be considered for further evaluation based on clinical concern. 3. Paranasal sinus fluid levels on opacification of the right greater than left mastoid air cells, possibly related to intubation. Electronically Signed   By: Keith Rake M.D.   On: 10/19/2019 15:30   CT ANGIO CHEST PE W OR WO CONTRAST  Result Date: 10/23/2019 CLINICAL DATA:  COVID pneumonia, acute hypoxic respiratory failure, positive D-dimer EXAM: CT ANGIOGRAPHY CHEST WITH CONTRAST TECHNIQUE:  Multidetector CT imaging of the chest was performed using the standard protocol during bolus administration of intravenous contrast. Multiplanar CT image reconstructions and MIPs were obtained to evaluate the vascular anatomy. CONTRAST:  9m OMNIPAQUE IOHEXOL 350 MG/ML SOLN COMPARISON:  None. FINDINGS: Cardiovascular: Satisfactory opacification of the pulmonary arteries to the segmental level. No evidence of pulmonary embolism. The central pulmonary arteries are of normal caliber. There is moderate coronary artery calcification. Normal heart size. No pericardial effusion. The thoracic aorta is unremarkable save for bovine arch anatomy. Right upper extremity PICC line tip is seen within the superior vena cava. Mediastinum/Nodes: No enlarged mediastinal, hilar, or axillary lymph nodes. Thyroid gland, trachea, and esophagus demonstrate no significant findings. Nasoenteric feeding tube extends into the upper abdomen beyond the margin of the examination. Lungs/Pleura: Lung volumes are small. Evaluation is limited by motion artifact, however, multifocal pulmonary infiltrates are identified demonstrating a peripheral and basal predominance, likely infectious or inflammatory in the acute setting. No central obstructing lesion. No pneumothorax or pleural effusion. No superimposed interstitial edema. Upper Abdomen: No acute abnormality. Musculoskeletal: No chest wall abnormality. No acute or significant osseous findings. Review of the MIP images confirms the above findings. IMPRESSION: 1. No evidence of pulmonary embolism. 2. Multifocal pulmonary infiltrates are identified demonstrating a peripheral and basal predominance, likely infectious or inflammatory in the acute setting. 3. Moderate coronary artery calcification.  Electronically Signed   By: Fidela Salisbury MD   On: 10/23/2019 16:25   DG Chest Port 1 View  Result Date: 10/25/2019 CLINICAL DATA:  Shortness of breath. EXAM: PORTABLE CHEST 1 VIEW COMPARISON:  October 16, 2019. FINDINGS: The heart size and mediastinal contours are within normal limits. Hypoinflation of the lungs is noted with mild bibasilar subsegmental atelectasis. Endotracheal tube has been removed. Right-sided PICC line is unchanged in position. The visualized skeletal structures are unremarkable. IMPRESSION: Hypoinflation of the lungs with mild bibasilar subsegmental atelectasis. Electronically Signed   By: Marijo Conception M.D.   On: 10/25/2019 08:37   DG CHEST PORT 1 VIEW  Result Date: 10/16/2019 CLINICAL DATA:  Status post intubation. EXAM: PORTABLE CHEST 1 VIEW COMPARISON:  10/14/2019 FINDINGS: ET tube tip is above the carina. There is a right arm PICC line with tip at the cavoatrial junction. Lung volumes are low. Similar appearance of bilateral pulmonary opacities compatible with multifocal pneumonia. IMPRESSION: 1. Stable support apparatus. 2. Persistent bilateral pulmonary opacities compatible with multifocal pneumonia. Electronically Signed   By: Kerby Moors M.D.   On: 10/16/2019 06:14   DG Chest Port 1 View  Result Date: 10/15/2019 CLINICAL DATA:  48 year old male with history of COVID infection. EXAM: PORTABLE CHEST 1 VIEW COMPARISON:  Chest x-ray 10/14/2019. FINDINGS: An endotracheal tube is in place with tip 3.6 cm above the carina. A nasogastric tube is seen extending into the stomach, however, the tip of the nasogastric tube extends below the lower margin of the image. There is a right upper extremity PICC with tip terminating in the right atrium. Lung volumes are low. Widespread patchy areas of interstitial prominence an ill-defined airspace disease again noted throughout the lungs bilaterally. Overall, aeration is essentially unchanged. No pleural effusions. No evidence of pulmonary edema. No pneumothorax. Heart size is normal. Upper mediastinal contours are within normal limits. IMPRESSION: 1. Support apparatus, as above. 2. The appearance the chest is compatible with multilobar  pneumonia from reported COVID infection. Electronically Signed   By: Vinnie Langton M.D.   On: 10/15/2019 05:39   DG Chest Port 1 View  Result Date: 10/14/2019 CLINICAL DATA:  COVID-19, ARDS EXAM: PORTABLE CHEST 1 VIEW COMPARISON:  10/13/2019 FINDINGS: Single frontal view of the chest demonstrates stable position of the endotracheal tube and enteric catheter. There is a right-sided PICC tip overlying superior vena cava. The cardiac silhouette is stable. Interstitial and ground-glass opacities are seen throughout the lungs bilaterally, unchanged. Lung volumes are diminished. No effusion or pneumothorax. IMPRESSION: 1. 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 2. Stable support devices. Electronically Signed   By: Randa Ngo M.D.   On: 10/14/2019 15:57   DG Chest Port 1 View  Result Date: 10/13/2019 CLINICAL DATA:  Acute respiratory failure with hypoxemia, COVID positive, asthma, diabetes mellitus, hypertension, CHF EXAM: PORTABLE CHEST 1 VIEW COMPARISON:  Portable exam 1021 hours compared to 10/12/2019 FINDINGS: Tip of endotracheal tube projects 3.7 cm above carina. Nasogastric tube extends into stomach. RIGHT arm PICC line tip projects over cavoatrial junction. Stable heart size mediastinal contours for technique. Patchy BILATERAL airspace infiltrates consistent with multifocal pneumonia, minimally improved. No pleural effusion or pneumothorax. IMPRESSION: Minimally improved pulmonary infiltrates. Electronically Signed   By: Lavonia Dana M.D.   On: 10/13/2019 10:37   DG CHEST PORT 1 VIEW  Result Date: 10/12/2019 CLINICAL DATA:  Intubation EXAM: PORTABLE CHEST 1 VIEW COMPARISON:  Earlier  today FINDINGS: New endotracheal tube with tip at the clavicular heads, measuring 3 cm above the carina. Low volume chest with severe airspace disease. No visible effusion or air leak. Stable heart size which is distorted by  positioning and volumes. IMPRESSION: 1. Unremarkable positioning of the endotracheal tube. 2. Stable extensive airspace disease with low lung volumes. Electronically Signed   By: Monte Fantasia M.D.   On: 10/12/2019 07:13   DG CHEST PORT 1 VIEW  Result Date: 10/12/2019 CLINICAL DATA:  Shortness of breath EXAM: PORTABLE CHEST 1 VIEW COMPARISON:  Three days ago FINDINGS: Very low volume chest with diffuse and patchy pulmonary opacity. Vascular pedicle widening and prominent heart size largely related to technique. No visible effusion or pneumothorax. IMPRESSION: Worsening multifocal pneumonia.  Lung volumes remain very low. Electronically Signed   By: Monte Fantasia M.D.   On: 10/12/2019 04:07   DG Chest Portable 1 View  Result Date: 10/09/2019 CLINICAL DATA:  Cough, fever, shortness of breath EXAM: PORTABLE CHEST 1 VIEW COMPARISON:  None. FINDINGS: Single frontal view of the chest demonstrates unremarkable cardiac silhouette. The lung volumes are diminished, with patchy bilateral areas of faint ground-glass airspace disease consistent with multifocal pneumonia. No effusion or pneumothorax. IMPRESSION: 1. Patchy bilateral ground-glass airspace disease consistent with multifocal pneumonia. Electronically Signed   By: Randa Ngo M.D.   On: 10/09/2019 19:32   DG Abd Portable 1V  Result Date: 10/20/2019 CLINICAL DATA:  GI problem. EXAM: PORTABLE ABDOMEN - 1 VIEW COMPARISON:  10/16/2019 FINDINGS: Feeding tube tip is in the distal stomach or proximal duodenum. Mild gaseous distention of the stomach. Gas throughout nondistended large and small bowel. No evidence of bowel obstruction, organomegaly or free air. IMPRESSION: Feeding tube tip in the distal stomach or proximal duodenum. No evidence of obstruction or free air. Electronically Signed   By: Rolm Baptise M.D.   On: 10/20/2019 21:28   DG Abd Portable 1V  Result Date: 10/16/2019 CLINICAL DATA:  Status post OG tube placement EXAM: PORTABLE ABDOMEN -  1 VIEW COMPARISON:  10/16/2019 FINDINGS: The enteric tube tip projects over the distal body of stomach in the side port is below the level of the GE junction. A few air-filled loops of small bowel are noted within the upper abdomen which measure up to 2.8 cm. IMPRESSION: Satisfactory position of enteric tube. Electronically Signed   By: Kerby Moors M.D.   On: 10/16/2019 06:52   DG Abd Portable 1V  Result Date: 10/12/2019 CLINICAL DATA:  Onychogryphosis EXAM: PORTABLE ABDOMEN - 1 VIEW COMPARISON:  Portable exam 0830 hours without priors for comparison FINDINGS: Tip of nasogastric tube projects over distal antrum. Nonobstructive bowel gas pattern. No bowel dilatation or bowel wall thickening. Patchy bibasilar infiltrates. No acute osseous findings. IMPRESSION: Tip of nasogastric tube projects over distal gastric antrum. Nonobstructive bowel gas pattern. Bibasilar pulmonary infiltrates. Electronically Signed   By: Lavonia Dana M.D.   On: 10/12/2019 08:51   DG Foot 2 Views Left  Result Date: 10/23/2019 CLINICAL DATA:  Bacteremia, LEFT foot pain and diabetic. EXAM: LEFT FOOT - 2 VIEW COMPARISON:  10/13/2019 and prior radiographs FINDINGS: Mildly angulated fractures of the 4th and 5th metatarsals are again noted with fracture lines still evident. Healing changes along these fractures are noted. Overlying soft tissue swelling is noted. No new fracture, subluxation or dislocation noted. No definite radiographic evidence of acute osteomyelitis noted. IMPRESSION: Mildly angulated healing fractures of the 4th and 5th metatarsals with soft tissue swelling again noted but  without definite radiographic evidence of acute osteomyelitis. Consider MRI for further evaluation as clinically indicated. Electronically Signed   By: Margarette Canada M.D.   On: 10/23/2019 13:40   DG Foot 2 Views Left  Result Date: 10/13/2019 CLINICAL DATA:  49 year old male with diabetic foot. EXAM: LEFT FOOT - 2 VIEW COMPARISON:  Left foot  radiograph dated 09/11/2019. FINDINGS: Evaluation is limited due to positioning. Fractures of the midportion of the fourth and fifth metacarpal with interval progression of bridging callus formation. No new fracture identified. There is no dislocation. There is diffuse soft tissue swelling of the foot. No radiopaque foreign object or soft tissue gas. IMPRESSION: 1. Healing fractures of the fourth and fifth metacarpal. 2. Diffuse soft tissue swelling. No radiopaque foreign object or soft tissue gas. Electronically Signed   By: Anner Crete M.D.   On: 10/13/2019 16:49   ECHOCARDIOGRAM COMPLETE  Result Date: 10/12/2019    ECHOCARDIOGRAM REPORT   Patient Name:   RODDRICK SHARRON Date of Exam: 10/12/2019 Medical Rec #:  [id number]      Height:       74.0 in Accession #:    [id number]     Weight:       245.0 lb Date of Birth:  1970-06-29      BSA:          2.369 m Patient Age:    67 years       BP:           111/74 mmHg Patient Gender: M              HR:           75 bpm. Exam Location:  Inpatient Procedure: 2D Echo Indications:    CHF 428                  & Bacteremia 790.7  History:        Patient has no prior history of Echocardiogram examinations.                 CHF; Risk Factors:Hypertension and Diabetes. Covid +.  Sonographer:    Jannett Celestine RDCS (AE) Referring Phys: [id number] Silvestre Moment [facility]  Sonographer Comments: Echo performed with patient supine and on artificial respirator. Image acquisition challenging due to respiratory motion and Image acquisition challenging due to patient body habitus. restricted mobility IMPRESSIONS  1. Extremely poor acoustic windows. LVEF appears to be depressed with diffuse hypokinesis, worse in the mid/distal inferior/inferoseptal walls. . Left ventricular ejection fraction, by estimation, is 30 to 35%. The left ventricle has moderately decreased function. The left ventricular internal cavity size was mildly dilated. Left ventricular diastolic parameters are indeterminate.  2. Right  ventricular systolic function is moderately reduced. The right ventricular size is mildly enlarged.  3. The mitral valve is normal in structure. Trivial mitral valve regurgitation.  4. The aortic valve is normal in structure. Aortic valve regurgitation is not visualized. FINDINGS  Left Ventricle: Extremely poor acoustic windows. LVEF appears to be depressed with diffuse hypokinesis, worse in the mid/distal inferior/inferoseptal walls. Left ventricular ejection fraction, by estimation, is 30 to 35%. The left ventricle has moderately decreased function. The left ventricle demonstrates regional wall motion abnormalities. The left ventricular internal cavity size was mildly dilated. There is no left ventricular hypertrophy. Left ventricular diastolic parameters are indeterminate. Right Ventricle: The right ventricular size is mildly enlarged. Right vetricular wall thickness was not assessed. Right ventricular systolic function is moderately reduced. Left Atrium: Left  atrial size was normal in size. Right Atrium: Right atrial size was normal in size. Pericardium: There is no evidence of pericardial effusion. Mitral Valve: The mitral valve is normal in structure. Trivial mitral valve regurgitation. Tricuspid Valve: The tricuspid valve is normal in structure. Tricuspid valve regurgitation is trivial. Aortic Valve: The aortic valve is normal in structure. Aortic valve regurgitation is not visualized. Pulmonic Valve: The pulmonic valve was grossly normal. Pulmonic valve regurgitation is trivial. Aorta: The aortic root is normal in size and structure. IAS/Shunts: The interatrial septum was not assessed.  LEFT VENTRICLE PLAX 2D LVIDd:         5.60 cm  Diastology LVIDs:         3.90 cm  LV e' lateral:   8.81 cm/s LV PW:         1.40 cm  LV E/e' lateral: 5.7 LV IVS:        1.10 cm  LV e' medial:    5.87 cm/s LVOT diam:     2.40 cm  LV E/e' medial:  8.5 LV SV:         64 LV SV Index:   27 LVOT Area:     4.52 cm  RIGHT VENTRICLE  RV S prime:     9.79 cm/s TAPSE (M-mode): 1.3 cm LEFT ATRIUM             Index       RIGHT ATRIUM           Index LA diam:        3.10 cm 1.31 cm/m  RA Area:     18.80 cm LA Vol (A2C):   62.9 ml 26.55 ml/m RA Volume:   51.80 ml  21.86 ml/m LA Vol (A4C):   40.8 ml 17.22 ml/m LA Biplane Vol: 50.8 ml 21.44 ml/m  AORTIC VALVE LVOT Vmax:   69.80 cm/s LVOT Vmean:  49.400 cm/s LVOT VTI:    0.142 m  AORTA Ao Root diam: 3.60 cm MITRAL VALVE MV Area (PHT): 2.34 cm    SHUNTS MV Decel Time: 324 msec    Systemic VTI:  0.14 m MV E velocity: 50.10 cm/s  Systemic Diam: 2.40 cm MV A velocity: 71.60 cm/s MV E/A ratio:  0.70 Dorris Carnes MD Electronically signed by Dorris Carnes MD Signature Date/Time: 10/12/2019/1:58:41 PM    Final    ECHO TEE  Result Date: 10/24/2019    TRANSESOPHOGEAL ECHO REPORT   Patient Name:   Arie Sabina Date of Exam: 10/24/2019 Medical Rec #:  [id number]      Height:       74.0 in Accession #:    [id number]     Weight:       247.4 lb Date of Birth:  [birth year]      BSA:          2.379 m Patient Age:    33 years       BP:           120/80 mmHg Patient Gender: M              HR:           100 bpm. Exam Location:  Inpatient Procedure: Transesophageal Echo Indications:    Emobli/Bacteremia  History:        Patient has prior history of Echocardiogram examinations.  Sonographer:    Clayton Lefort RDCS (AE) Referring Phys: [phone] HAO MENG PROCEDURE: The transesophogeal probe was passed without difficulty through the esophogus  of the patient. Sedation performed by different physician. The patient's vital signs; including heart rate, blood pressure, and oxygen saturation; remained stable throughout the procedure. The patient developed no complications during the procedure. IMPRESSIONS  1. Left ventricular ejection fraction, by estimation, is 50 to 55%. The left ventricle has low normal function. The left ventricle has no regional wall motion abnormalities.  2. Right ventricular systolic function is normal. The  right ventricular size is normal.  3. No left atrial/left atrial appendage thrombus was detected.  4. The mitral valve is normal in structure. Trivial mitral valve regurgitation. No evidence of mitral stenosis.  5. The aortic valve is normal in structure. Aortic valve regurgitation is not visualized. No aortic stenosis is present.  6. The inferior vena cava is normal in size with greater than 50% respiratory variability, suggesting right atrial pressure of 3 mmHg. Conclusion(s)/Recommendation(s): Normal biventricular function without evidence of hemodynamically significant valvular heart disease. FINDINGS  Left Ventricle: Left ventricular ejection fraction, by estimation, is 50 to 55%. The left ventricle has low normal function. The left ventricle has no regional wall motion abnormalities. The left ventricular internal cavity size was normal in size. There is no left ventricular hypertrophy. Right Ventricle: The right ventricular size is normal. No increase in right ventricular wall thickness. Right ventricular systolic function is normal. Left Atrium: Left atrial size was normal in size. No left atrial/left atrial appendage thrombus was detected. Right Atrium: Right atrial size was normal in size. Pericardium: There is no evidence of pericardial effusion. Mitral Valve: The mitral valve is normal in structure. Normal mobility of the mitral valve leaflets. Trivial mitral valve regurgitation. No evidence of mitral valve stenosis. There is no evidence of mitral valve vegetation. Tricuspid Valve: The tricuspid valve is normal in structure. Tricuspid valve regurgitation is mild . No evidence of tricuspid stenosis. There is no evidence of tricuspid valve vegetation. Aortic Valve: The aortic valve is normal in structure. Aortic valve regurgitation is not visualized. No aortic stenosis is present. There is no evidence of aortic valve vegetation. Pulmonic Valve: The pulmonic valve was normal in structure. Pulmonic valve  regurgitation is trivial. No evidence of pulmonic stenosis. Aorta: The aortic root is normal in size and structure. Venous: The inferior vena cava is normal in size with greater than 50% respiratory variability, suggesting right atrial pressure of 3 mmHg. IAS/Shunts: No atrial level shunt detected by color flow Doppler. Jenkins Rouge MD Electronically signed by Jenkins Rouge MD Signature Date/Time: 10/24/2019/3:28:45 PM    Final    VAS [place] LOWER EXTREMITY ARTERIAL DUPLEX  Result Date: 10/19/2019 LOWER EXTREMITY ARTERIAL DUPLEX STUDY Indications: Cold leg, Covid-19.  Current ABI: N/A Comparison Study: No prior study Performing Technologist: Sharion Dove RVS  Examination Guidelines: A complete evaluation includes B-mode imaging, spectral Doppler, color Doppler, and power Doppler as needed of all accessible portions of each vessel. Bilateral testing is considered an integral part of a complete examination. Limited examinations for reoccurring indications may be performed as noted.  +-----------+--------+-----+--------+---------+--------+ RIGHT      PSV cm/sRatioStenosisWaveform Comments +-----------+--------+-----+--------+---------+--------+ CFA Prox   67                   triphasic         +-----------+--------+-----+--------+---------+--------+ DFA        60                   triphasic         +-----------+--------+-----+--------+---------+--------+ SFA Prox   68  triphasic         +-----------+--------+-----+--------+---------+--------+ SFA Mid    62                   triphasic         +-----------+--------+-----+--------+---------+--------+ SFA Distal 69                   triphasic         +-----------+--------+-----+--------+---------+--------+ POP Prox   47                   triphasic         +-----------+--------+-----+--------+---------+--------+ POP Distal 42                   triphasic          +-----------+--------+-----+--------+---------+--------+ ATA Distal 15                   triphasic         +-----------+--------+-----+--------+---------+--------+ PTA Prox   31                   triphasic         +-----------+--------+-----+--------+---------+--------+ PTA Mid    38                   triphasic         +-----------+--------+-----+--------+---------+--------+ PTA Distal 38                   triphasic         +-----------+--------+-----+--------+---------+--------+ PERO Distal36                   triphasic         +-----------+--------+-----+--------+---------+--------+  Summary: Right: Normal examination. No evidence of arterial occlusive disease. Normal waveforms, no stenosis.  See table(s) above for measurements and observations. Electronically signed by Monica Martinez MD on 10/19/2019 at 3:25:02 PM.    Final    [place] EKG SITE RITE  Result Date: 10/12/2019 If Site Rite image not attached, placement could not be confirmed due to current cardiac rhythm.

## 2019-10-25 NOTE — Progress Notes (Addendum)
Nutrition Follow-up  DOCUMENTATION CODES:   Not applicable  INTERVENTION:  Provide Ensure Enlive po TID, each supplement provides 350 kcal and 20 grams of protein.  Provide 30 ml Prosource plus po BID, each supplement provides 100 kcal and 15 grams of protein.   Encourage adequate PO intake.   NUTRITION DIAGNOSIS:   Inadequate oral intake related to acute illness as evidenced by NPO status; diet advanced; ongoing  GOAL:   Patient will meet greater than or equal to 90% of their needs; progressing  MONITOR:   Supplement acceptance, PO intake, Skin, Weight trends, Labs, I & O's  REASON FOR ASSESSMENT:   Ventilator    ASSESSMENT:   49 yo male admitted with COVID 19 pneumonia requiring intubation. PMH includes HTN, gout, DM, CHF, IBS  8/16 Admitted to [facility] 8/19 Transferred to [facility], Intubated 8/25 Cortrak placed, gastric  8/28 Extubated 8/31 Cortrak dislodged during TEE  Diet advanced today. Cortrak NGT replacement canceled. RD to order nutritional supplements to aid in PO intake and caloric protein needs. Pt continues on 3 L nasal cannula. Labs and medications reviewed. Tube feeding orders to be discontinued. PO intake encouraged at meals.   Diet Order:   Diet Order            Diet heart healthy/carb modified Room service appropriate? Yes with Assist; Fluid consistency: Thin  Diet effective now                 EDUCATION NEEDS:   Not appropriate for education at this time  Skin:  Skin Assessment: Reviewed RN Assessment Skin Integrity Issues:: Diabetic Ulcer Diabetic Ulcer: L. Foot after stepping on nail 2-3 weeks ago Other: wound to L. Foot after stepping on nail 2-3 weeks ago  Last BM:  8/31  Height:   Ht Readings from Last 1 Encounters:  10/24/19 6\' 2"  (1.88 m)    Weight:   Wt Readings from Last 1 Encounters:  10/25/19 112.5 kg   BMI:  Body mass index is 31.84 kg/m.  Estimated Nutritional Needs:   Kcal:  2600-2800 kcals  Protein:  130-150  g  Fluid:  >/= 2 L  12/25/19, MS, RD, LDN RD pager number/after hours weekend pager number on Amion.

## 2019-10-25 NOTE — Consult Note (Signed)
NEUROLOGY CONSULTATION NOTE   Date of service: October 25, 2019 Patient Name: Micheal Bell MRN:  [id number] DOB:  [birth year] Reason for consult: "Stroke on MRI"  History of Present Illness  [name] is a 48 y.o. male with PMH significant for HTN, DM2, gout, IBS, firbomyalgia, obesity who is admitted with COVID Pneumonia s/p steroids, remdesivir, Baricitinib. He was intubated on 8/19 and extubated on 8/28. Hospitalization complicated by MSSA and Group B strep bacteremia on Ancef with no clear source and TEE neg for vegetation. He was transferred from ICU to floor on 10/25/19.  He had MRI Brain on 10/25/19 which demonstrated a 36mm focus of diffusion restriction in the left frotoparietal lobe concerning for acute/early subacute infarct vs cerebritis.  Patient denies any prior history of stroke, is not aware that he had a stroke on the MRI. Does not take aspirin, does not smoke. Endorses Grand father had a stroke.   ROS   Constitutional Denies weight loss, fever and chills.  HEENT Denies changes in vision and hearing.  Respiratory Denies SOB and cough.  CV Denies palpitations and CP  GI Denies abdominal pain, nausea, vomiting and diarrhea.  GU Denies dysuria and urinary frequency.  MSK Denies myalgia and joint pain.  Skin Denies rash and pruritus.  Neurological Denies headache and syncope.  Psychiatric Denies recent changes in mood. Denies anxiety and depression.   Past History   Past Medical History:  Diagnosis Date  . Arthritis   . Asthma   . CHF (congestive heart failure) (HCC)   . Diabetes mellitus without complication (HCC)   . Fibromyalgia   . Gout   . Hypertension   . IBS (irritable bowel syndrome)   . Vein disorder    Past Surgical History:  Procedure Laterality Date  . FOOT SURGERY    . TEE WITHOUT CARDIOVERSION N/A 10/24/2019   Procedure: TRANSESOPHAGEAL ECHOCARDIOGRAM (TEE);  Surgeon: [name], MD;  Location: [facility] ENDOSCOPY;  Service: Cardiovascular;   Laterality: N/A;   No family history on file. Social History   Socioeconomic History  . Marital status: Single    Spouse name: Not on file  . Number of children: Not on file  . Years of education: Not on file  . Highest education level: Not on file  Occupational History  . Not on file  Tobacco Use  . Smoking status: Never Smoker  . Smokeless tobacco: Never Used  Vaping Use  . Vaping Use: Never used  Substance and Sexual Activity  . Alcohol use: Never  . Drug use: Never  . Sexual activity: Not on file  Other Topics Concern  . Not on file  Social History Narrative  . Not on file   Social Determinants of Health   Financial Resource Strain:   . Difficulty of Paying Living Expenses: Not on file  Food Insecurity:   . Worried About Programme researcher, broadcasting/film/video in the Last Year: Not on file  . Ran Out of Food in the Last Year: Not on file  Transportation Needs:   . Lack of Transportation (Medical): Not on file  . Lack of Transportation (Non-Medical): Not on file  Physical Activity:   . Days of Exercise per Week: Not on file  . Minutes of Exercise per Session: Not on file  Stress:   . Feeling of Stress : Not on file  Social Connections:   . Frequency of Communication with Friends and Family: Not on file  . Frequency of Social Gatherings with Friends  and Family: Not on file  . Attends Religious Services: Not on file  . Active Member of Clubs or Organizations: Not on file  . Attends Banker Meetings: Not on file  . Marital Status: Not on file   Allergies  Allergen Reactions  . Azithromycin   . Lodine [Etodolac] Anaphylaxis    Medications   Medications Prior to Admission  Medication Sig Dispense Refill Last Dose  . allopurinol (ZYLOPRIM) 300 MG tablet Take 300 mg by mouth daily.   Past Week at Unknown time  . buPROPion (WELLBUTRIN XL) 150 MG 24 hr tablet Take 150 mg by mouth every morning.   Past Week at Unknown time  . chlorthalidone (HYGROTON) 25 MG tablet Take  25 mg by mouth daily.   Past Week at Unknown time  . citalopram (CELEXA) 40 MG tablet Take 40 mg by mouth daily.   Past Week at Unknown time  . cyclobenzaprine (FLEXERIL) 10 MG tablet Take 10 mg by mouth 3 (three) times daily as needed for muscle spasms.   Past Week at Unknown time  . furosemide (LASIX) 20 MG tablet Take 20 mg by mouth.   Past Week at Unknown time  . glipiZIDE (GLUCOTROL) 10 MG tablet Take 10 mg by mouth daily before breakfast.   Past Week at Unknown time  . HYDROcodone-acetaminophen (NORCO/VICODIN) 5-325 MG tablet Take 1 tablet by mouth every 6 (six) hours as needed for moderate pain.   Past Week at Unknown time  . meloxicam (MOBIC) 15 MG tablet Take 15 mg by mouth daily.   Past Week at Unknown time  . montelukast (SINGULAIR) 10 MG tablet Take 10 mg by mouth at bedtime.   Past Week at Unknown time  . nitroGLYCERIN (NITRODUR - DOSED IN MG/24 HR) 0.2 mg/hr patch Place 0.2 mg onto the skin daily.   unknowm  . nortriptyline (PAMELOR) 25 MG capsule Take 25 mg by mouth at bedtime.   Past Week at Unknown time  . pantoprazole (PROTONIX) 40 MG tablet Take 40 mg by mouth daily.   Past Week at Unknown time  . pioglitazone (ACTOS) 15 MG tablet Take 15 mg by mouth daily.   Past Week at Unknown time  . predniSONE (DELTASONE) 10 MG tablet Take 10 mg by mouth daily with breakfast.   Past Week at Unknown time  . verapamil (CALAN-SR) 240 MG CR tablet Take 240 mg by mouth at bedtime.   Past Week at Unknown time  . zolpidem (AMBIEN) 10 MG tablet Take 10 mg by mouth at bedtime as needed for sleep.   Past Week at Unknown time  . albuterol (PROVENTIL HFA;VENTOLIN HFA) 108 (90 Base) MCG/ACT inhaler Inhale into the lungs every 6 (six) hours as needed for wheezing or shortness of breath.   unknown  . Fluticasone Propionate, Inhal, (FLOVENT IN) Inhale into the lungs.   unknown  . ibuprofen (ADVIL) 600 MG tablet Take 600 mg by mouth every 6 (six) hours as needed (pain).    unknown  . lidocaine (LIDODERM) 5 %  Place 1 patch onto the skin daily. Remove & Discard patch within 12 hours or as directed by MD 30 patch 0 unknown  . mometasone-formoterol (DULERA) 100-5 MCG/ACT AERO Inhale 2 puffs into the lungs 2 (two) times daily.   unknown  . traMADol (ULTRAM) 50 MG tablet Take 1 tablet (50 mg total) by mouth every 6 (six) hours as needed. (Patient not taking: Reported on 10/12/2019) 15 tablet 0 Not Taking at Unknown time  Vitals  Temp:  [97.8 F (36.6 C)-99 F (37.2 C)] 98.5 F (36.9 C) (09/01 1715) Pulse Rate:  [84-111] 84 (09/01 1715) Resp:  [18-32] 21 (09/01 1715) BP: (123-143)/(64-90) 131/71 (09/01 1715) SpO2:  [88 %-97 %] 93 % (09/01 1715) Weight:  [112.5 kg] 112.5 kg (09/01 0408)  Body mass index is 31.84 kg/m.  Physical Exam   General: Laying comfortably in bed; in no acute distress.  HENT: Normal oropharynx and mucosa. Normal external appearance of ears and nose. Neck: Supple, no pain or tenderness CV: No JVD. No peripheral edema. Pulmonary: Symmetric Chest rise. Normal respiratory effort. Abdomen: Soft to touch, non-tender Ext: No cyanosis, edema, or deformity  Skin: No rash. Normal palpation of skin.   Musculoskeletal: Normal digits and nails by inspection. No clubbing.  Neurologic Examination  Mental status/Cognition: Alert, oriented to self, place, month but not year, poor attention. Speech/language: Fluent, comprehension intact, object naming intact, repetition intact. Cranial nerves:   CN II Pupils equal and reactive to light, no VF deficits   CN III,IV,VI EOM intact, no gaze preference or deviation, no nystagmus   CN V normal sensation in V1, V2, and V3 segments bilaterally   CN VII no asymmetry, no nasolabial fold flattening   CN VIII normal hearing to speech   CN IX & X normal palatal elevation, no uvular deviation   CN XI 5/5 head turn and 5/5 shoulder shrug bilaterally   CN XII midline tongue protrusion   Motor:  Muscle bulk: normal, tone decreased in RLE Mvmt  Root Nerve  Muscle Right Left Comments  SA C5/6 Ax Deltoid 3 3   EF C5/6 Mc Biceps 4+ 4+   EE C6/7/8 Rad Triceps 4+ 4+   WF C6/7 Med FCR 4+ 4+   WE C7/8 PIN ECU 4+ 4+   F Ab C8/T1 U ADM/FDI 4+ 4+   HF L1/2/3 Fem Illopsoas 2 4+   KE L2/3/4 Fem Quad 2 4+   DF L4/5 D Peron Tib Ant 3 4+   PF S1/2 Tibial Grc/Sol 3 4+    Reflexes:  Right Left Comments  Pectoralis      Biceps (C5/6) 1 1   Brachioradialis (C5/6) 1 1    Triceps (C6/7) 1 1    Patellar (L3/4) 1 1    Achilles (S1)      Hoffman      Plantar     Jaw jerk    Sensation:  Light touch Intact throughout   Pin prick    Temperature    Vibration   Proprioception    Coordination/Complex Motor:  - Finger to Nose intact BL - Gait: Deferred.  Labs   Lab Results  Component Value Date   NA 141 10/25/2019   K 3.3 (L) 10/25/2019   CL 102 10/25/2019   CO2 27 10/25/2019   GLUCOSE 100 (H) 10/25/2019   BUN 17 10/25/2019   CREATININE 0.69 10/25/2019   CALCIUM 9.1 10/25/2019   ALBUMIN 2.5 (L) 10/25/2019   AST 30 10/25/2019   ALT 25 10/25/2019   ALKPHOS 105 10/25/2019   BILITOT 0.4 10/25/2019   GFRNONAA >60 10/25/2019   GFRAA >60 10/25/2019     Imaging and Diagnostic studies  MRI Brain without contrast: Examination limited by poor signal on several sequences as well as motion degradation, as described. 17 mm focus of restricted diffusion and T2/FLAIR hyperintensity within the subcortical left frontoparietal lobes. This finding is nonspecific, but given the patient's history, the primary differential considerations are acute/early  subacute infarct versus cerebritis. Consider contrast-enhanced MR imaging of the brain for further evaluation of this lesion. Mild chronic small vessel ischemic disease. Paranasal sinus disease with air-fluid levels. Bilateral mastoid effusions.     Impression   FELDER LEBEDA is a 49 y.o. male with PMH significant for HTN, DM2, gout, IBS, firbomyalgia, obesity who is admitted with  COVID Pneumonia c/b MSSA and GBS Bacteremia with negative TEE. 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 Exam with RLE weakness and encephalopathy with some deconditioning. The appearance of the stroke on the MRI is somewhat atypical and does raise some suspicion for an underlying cerebritis or potentially mycotic anerysm specially with recent MSSA and GBS bacteremia. Therefore, in addition to the standard stroke workup, will recommend obtaining MRI Brain with contrast to assess for any contrast enhancement. Cerebritis should typically present as a ring enhancing lesion while stroke in subacute period can have patchy enhancement.  Recommendations  - NIHSS checks per stroke protocol - Brain imaging- MRI Brain with contrast  - MR Arteriogram brain without contrast - Vasc doppler carotid BL - TTE - EF of 30-35% - Lipid panel - ordered  - Statin to be started if LDL > 70. - A1C - ordered  - Antithrombotic - Plavix 75mg  daily(?allergic to aspirin) - DVT ppx - SQ Heparin - SBP goal - aim for gradual normotension - Telemetry monitoring for arrythmia - Swallow screen - Stroke education - PT/OT/SLP consult  ______________________________________________________________________   Thank you for the opportunity to take part in the care of this patient. If you have any further questions, please contact the neurology consultation attending.  Signed,  [facility] Pager Number [name]

## 2019-10-25 NOTE — Progress Notes (Signed)
[facility]   Reason for visit: Follow up on bacteremia  Interval History: afebrile, WBC 15.5.  Repeat xray of foot without osteomyelitis.  MRI scheduled as well.  TEE negative for vegetation.    Physical Exam: Constitutional:  Vitals:   10/25/19 0726 10/25/19 1146  BP: (!) 143/77 129/76  Pulse: 88   Resp: 20 20  Temp: 97.8 F (36.6 C) 98 F (36.7 C)  SpO2: 96% 97%   patient appears in NAD Skin: no rashes MS: right foot is cold, same dusky appearance on all toes on the dorsal side  Review of Systems: Constitutional: no fever GI: no diarrhea.   Lab Results  Component Value Date   WBC 15.5 (H) 10/25/2019   HGB 13.2 10/25/2019   HCT 40.1 10/25/2019   MCV 94.1 10/25/2019   PLT 506 (H) 10/25/2019    Lab Results  Component Value Date   CREATININE 0.69 10/25/2019   BUN 17 10/25/2019   NA 141 10/25/2019   K 3.3 (L) 10/25/2019   CL 102 10/25/2019   CO2 27 10/25/2019    Lab Results  Component Value Date   ALT 25 10/25/2019   AST 30 10/25/2019   ALKPHOS 105 10/25/2019     Microbiology: Recent Results (from the past 240 hour(s))  MRSA PCR Screening     Status: None   Collection Time: 10/17/19  8:51 AM   Specimen: Nasal Mucosa; Nasopharyngeal  Result Value Ref Range Status   MRSA by PCR NEGATIVE NEGATIVE Final    Comment:        The GeneXpert MRSA Assay (FDA approved for NASAL specimens only), is one component of a comprehensive MRSA colonization surveillance program. It is not intended to diagnose MRSA infection nor to guide or monitor treatment for MRSA infections. Performed at [facility], [address], [place], [place] [address]   Culture, blood (routine x 2)     Status: None (Preliminary result)   Collection Time: 10/23/19 10:58 AM   Specimen: BLOOD  Result Value Ref Range Status   Specimen Description BLOOD LEFT ANTECUBITAL  Final   Special Requests   Final    BOTTLES DRAWN AEROBIC ONLY Blood Culture adequate  volume   Culture   Final    NO GROWTH 2 DAYS Performed at [facility], [address], [place], [place] [address]    Report Status PENDING  Incomplete  Culture, blood (routine x 2)     Status: None (Preliminary result)   Collection Time: 10/23/19 11:00 AM   Specimen: BLOOD LEFT HAND  Result Value Ref Range Status   Specimen Description BLOOD LEFT HAND  Final   Special Requests   Final    BOTTLES DRAWN AEROBIC ONLY Blood Culture adequate volume   Culture   Final    NO GROWTH 2 DAYS Performed at [facility], [address] [place], [place] [address]    Report Status PENDING  Incomplete    Impression/Plan:  1. Bacteremia - MSSA and GBS.  His TEE is negative for vegetation, xray negative for osteomyelitis.  Day 16 total of antibiotics Day 14 of cefazolin Continue cefazolin.  He will not need long term IV antibiotics at discharge.  Duration will depend on MRI of foot, if osteomyelitis or not.    2.  Left foot ulcer - xray not revealing and MRI ordered.  Will continue to follow  3. COVID - severe and on isolation for 21 days.  Initially tested 8/16.

## 2019-10-25 NOTE — Progress Notes (Signed)
Progress Note  Patient Name: Micheal Bell Date of Encounter: 10/25/2019  [facility] Cardiologist: No primary care provider on file. new  Subjective   Feeling better.  Denies chest pain.  Breathing is improving  Inpatient Medications    Scheduled Meds: . chlorhexidine gluconate (MEDLINE KIT)  15 mL Mouth Rinse BID  . Chlorhexidine Gluconate Cloth  6 each Topical Daily  . docusate  100 mg Per Tube BID  . enoxaparin (LOVENOX) injection  60 mg Subcutaneous Daily  . famotidine  20 mg Per Tube BID  . feeding supplement (PROSource TF)  45 mL Per Tube BID  . insulin aspart  0-15 Units Subcutaneous Q4H  . insulin glargine  20 Units Subcutaneous BID  . linagliptin  5 mg Oral Daily  . metoprolol succinate  25 mg Oral Daily  . polyethylene glycol  17 g Per Tube Daily  . predniSONE  10 mg Per Tube Q breakfast  . sennosides  10 mL Per Tube BID  . sodium chloride flush  10-40 mL Intracatheter Q12H   Continuous Infusions: . sodium chloride Stopped (10/12/19 1606)  .  ceFAZolin (ANCEF) IV 2 g (10/25/19 0510)  . feeding supplement (VITAL 1.5 CAL) Stopped (10/24/19 0000)  . magnesium sulfate bolus IVPB 1 g (10/25/19 1048)   PRN Meds: sodium chloride, acetaminophen, bisacodyl, ipratropium-albuterol, oxyCODONE   Vital Signs    Vitals:   10/25/19 0405 10/25/19 0408 10/25/19 0435 10/25/19 0726  BP: 126/90   (!) 143/77  Pulse: (!) 111   88  Resp: 20   20  Temp: 98.7 F (37.1 C)   97.8 F (36.6 C)  TempSrc: Oral   Oral  SpO2: (!) 88%  91% 96%  Weight:  112.5 kg    Height:        Intake/Output Summary (Last 24 hours) at 10/25/2019 1145 Last data filed at 10/25/2019 0533 Gross per 24 hour  Intake 300 ml  Output 600 ml  Net -300 ml   Last 3 Weights 10/25/2019 10/24/2019 10/23/2019  Weight (lbs) 248 lb 0.3 oz 247 lb 5.7 oz 256 lb 6.3 oz  Weight (kg) 112.5 kg 112.2 kg 116.3 kg      Telemetry    Sinus rhythm, sinus tachycardia.  QTc 511 ms - Personally Reviewed  ECG    N/a -  Personally Reviewed  Physical Exam   VS:  BP 129/76 (BP Location: Left Arm)   Pulse 88   Temp 98 F (36.7 C) (Oral)   Resp 20   Ht 6' 2"  (1.88 m)   Wt 112.5 kg   SpO2 97%   BMI 31.84 kg/m  , BMI Body mass index is 31.84 kg/m. GENERAL: Ill-appearing.  Improving and more responsive. HEENT: Pupils equal round and reactive, fundi not visualized, oral mucosa unremarkable NECK:  No jugular venous distention, waveform within normal limits, carotid upstroke brisk and symmetric, no bruits, LUNGS:  Clear to auscultation bilaterally HEART:  Tachycardic.  Regular rhythm PMI not displaced or sustained,S1 and S2 within normal limits, no S3, no S4, no clicks, no rubs, no murmurs ABD:  Flat, positive bowel sounds normal in frequency in pitch, no bruits, no rebound, no guarding, no midline pulsatile mass, no hepatomegaly, no splenomegaly EXT:   R foot dusky.  Unable to palpate pedal pulses. No edema, no cyanosis no clubbing SKIN:  No rashes no nodules NEURO:  Cranial nerves II through XII grossly intact, motor grossly intact throughout PSYCH:  Answering questions today with one word answers.  Labs    High Sensitivity Troponin:   Recent Labs  Lab 10/12/19 1616 10/12/19 2011  TROPONINIHS 121* 78*      Chemistry Recent Labs  Lab 10/23/19 0352 10/24/19 0130 10/25/19 0352  NA 139 143 141  K 3.9 3.5 3.3*  CL 99 102 102  CO2 28 29 27   GLUCOSE 217* 82 100*  BUN 21* 22* 17  CREATININE 0.66 0.76 0.69  CALCIUM 9.0 9.2 9.1  PROT  --  6.9 6.9  ALBUMIN  --  2.3* 2.5*  AST  --  34 30  ALT  --  31 25  ALKPHOS  --  122 105  BILITOT  --  0.3 0.4  GFRNONAA >60 >60 >60  GFRAA >60 >60 >60  ANIONGAP 12 12 12      Hematology Recent Labs  Lab 10/23/19 0352 10/24/19 0130 10/25/19 0352  WBC 18.7* 18.1* 15.5*  RBC 4.36 4.13* 4.26  HGB 13.0 12.3* 13.2  HCT 40.9 39.2 40.1  MCV 93.8 94.9 94.1  MCH 29.8 29.8 31.0  MCHC 31.8 31.4 32.9  RDW 11.9 12.2 12.4  PLT 523* 554* 506*     BNP Recent Labs  Lab 10/25/19 0353  BNP 34.4     DDimer  Recent Labs  Lab 10/23/19 1058 10/24/19 0130 10/25/19 0352  DDIMER 5.19* 3.55* 5.12*     Radiology    CT ANGIO CHEST PE W OR WO CONTRAST  Result Date: 10/23/2019 CLINICAL DATA:  COVID pneumonia, acute hypoxic respiratory failure, positive D-dimer EXAM: CT ANGIOGRAPHY CHEST WITH CONTRAST TECHNIQUE: Multidetector CT imaging of the chest was performed using the standard protocol during bolus administration of intravenous contrast. Multiplanar CT image reconstructions and MIPs were obtained to evaluate the vascular anatomy. CONTRAST:  42m OMNIPAQUE IOHEXOL 350 MG/ML SOLN COMPARISON:  None. FINDINGS: Cardiovascular: Satisfactory opacification of the pulmonary arteries to the segmental level. No evidence of pulmonary embolism. The central pulmonary arteries are of normal caliber. There is moderate coronary artery calcification. Normal heart size. No pericardial effusion. The thoracic aorta is unremarkable save for bovine arch anatomy. Right upper extremity PICC line tip is seen within the superior vena cava. Mediastinum/Nodes: No enlarged mediastinal, hilar, or axillary lymph nodes. Thyroid gland, trachea, and esophagus demonstrate no significant findings. Nasoenteric feeding tube extends into the upper abdomen beyond the margin of the examination. Lungs/Pleura: Lung volumes are small. Evaluation is limited by motion artifact, however, multifocal pulmonary infiltrates are identified demonstrating a peripheral and basal predominance, likely infectious or inflammatory in the acute setting. No central obstructing lesion. No pneumothorax or pleural effusion. No superimposed interstitial edema. Upper Abdomen: No acute abnormality. Musculoskeletal: No chest wall abnormality. No acute or significant osseous findings. Review of the MIP images confirms the above findings. IMPRESSION: 1. No evidence of pulmonary embolism. 2. Multifocal pulmonary  infiltrates are identified demonstrating a peripheral and basal predominance, likely infectious or inflammatory in the acute setting. 3. Moderate coronary artery calcification. Electronically Signed   By: AFidela SalisburyMD   On: 10/23/2019 16:25   DG Chest Port 1 View  Result Date: 10/25/2019 CLINICAL DATA:  Shortness of breath. EXAM: PORTABLE CHEST 1 VIEW COMPARISON:  October 16, 2019. FINDINGS: The heart size and mediastinal contours are within normal limits. Hypoinflation of the lungs is noted with mild bibasilar subsegmental atelectasis. Endotracheal tube has been removed. Right-sided PICC line is unchanged in position. The visualized skeletal structures are unremarkable. IMPRESSION: Hypoinflation of the lungs with mild bibasilar subsegmental atelectasis. Electronically Signed   By: JJeneen Rinks  Murlean Caller M.D.   On: 10/25/2019 08:37   DG Foot 2 Views Left  Result Date: 10/23/2019 CLINICAL DATA:  Bacteremia, LEFT foot pain and diabetic. EXAM: LEFT FOOT - 2 VIEW COMPARISON:  10/13/2019 and prior radiographs FINDINGS: Mildly angulated fractures of the 4th and 5th metatarsals are again noted with fracture lines still evident. Healing changes along these fractures are noted. Overlying soft tissue swelling is noted. No new fracture, subluxation or dislocation noted. No definite radiographic evidence of acute osteomyelitis noted. IMPRESSION: Mildly angulated healing fractures of the 4th and 5th metatarsals with soft tissue swelling again noted but without definite radiographic evidence of acute osteomyelitis. Consider MRI for further evaluation as clinically indicated. Electronically Signed   By: Margarette Canada M.D.   On: 10/23/2019 13:40   ECHO TEE  Result Date: 10/24/2019    TRANSESOPHOGEAL ECHO REPORT   Patient Name:   Micheal Bell Date of Exam: 10/24/2019 Medical Rec #:  [id number]      Height:       74.0 in Accession #:    [id number]     Weight:       247.4 lb Date of Birth:  1970/03/31      BSA:          2.379 m  Patient Age:    63 years       BP:           120/80 mmHg Patient Gender: M              HR:           100 bpm. Exam Location:  Inpatient Procedure: Transesophageal Echo Indications:    Emobli/Bacteremia  History:        Patient has prior history of Echocardiogram examinations.  Sonographer:    Clayton Lefort RDCS (AE) Referring Phys: [phone] HAO MENG PROCEDURE: The transesophogeal probe was passed without difficulty through the esophogus of the patient. Sedation performed by different physician. The patient's vital signs; including heart rate, blood pressure, and oxygen saturation; remained stable throughout the procedure. The patient developed no complications during the procedure. IMPRESSIONS  1. Left ventricular ejection fraction, by estimation, is 50 to 55%. The left ventricle has low normal function. The left ventricle has no regional wall motion abnormalities.  2. Right ventricular systolic function is normal. The right ventricular size is normal.  3. No left atrial/left atrial appendage thrombus was detected.  4. The mitral valve is normal in structure. Trivial mitral valve regurgitation. No evidence of mitral stenosis.  5. The aortic valve is normal in structure. Aortic valve regurgitation is not visualized. No aortic stenosis is present.  6. The inferior vena cava is normal in size with greater than 50% respiratory variability, suggesting right atrial pressure of 3 mmHg. Conclusion(s)/Recommendation(s): Normal biventricular function without evidence of hemodynamically significant valvular heart disease. FINDINGS  Left Ventricle: Left ventricular ejection fraction, by estimation, is 50 to 55%. The left ventricle has low normal function. The left ventricle has no regional wall motion abnormalities. The left ventricular internal cavity size was normal in size. There is no left ventricular hypertrophy. Right Ventricle: The right ventricular size is normal. No increase in right ventricular wall thickness. Right  ventricular systolic function is normal. Left Atrium: Left atrial size was normal in size. No left atrial/left atrial appendage thrombus was detected. Right Atrium: Right atrial size was normal in size. Pericardium: There is no evidence of pericardial effusion. Mitral Valve: The mitral valve is normal in structure. Normal  mobility of the mitral valve leaflets. Trivial mitral valve regurgitation. No evidence of mitral valve stenosis. There is no evidence of mitral valve vegetation. Tricuspid Valve: The tricuspid valve is normal in structure. Tricuspid valve regurgitation is mild . No evidence of tricuspid stenosis. There is no evidence of tricuspid valve vegetation. Aortic Valve: The aortic valve is normal in structure. Aortic valve regurgitation is not visualized. No aortic stenosis is present. There is no evidence of aortic valve vegetation. Pulmonic Valve: The pulmonic valve was normal in structure. Pulmonic valve regurgitation is trivial. No evidence of pulmonic stenosis. Aorta: The aortic root is normal in size and structure. Venous: The inferior vena cava is normal in size with greater than 50% respiratory variability, suggesting right atrial pressure of 3 mmHg. IAS/Shunts: No atrial level shunt detected by color flow Doppler. Jenkins Rouge MD Electronically signed by Jenkins Rouge MD Signature Date/Time: 10/24/2019/3:28:45 PM    Final     Cardiac Studies   TTE 10/12/19: IMPRESSIONS  1. Extremely poor acoustic windows. LVEF appears to be depressed with  diffuse hypokinesis, worse in the mid/distal inferior/inferoseptal walls.  . Left ventricular ejection fraction, by estimation, is 30 to 35%. The  left ventricle has moderately  decreased function. The left ventricular internal cavity size was mildly  dilated. Left ventricular diastolic parameters are indeterminate.  2. Right ventricular systolic function is moderately reduced. The right  ventricular size is mildly enlarged.  3. The mitral valve is  normal in structure. Trivial mitral valve  regurgitation.  4. The aortic valve is normal in structure. Aortic valve regurgitation is  not visualized.   TEE 10/24/19: 1. Left ventricular ejection fraction, by estimation, is 50 to 55%. The  left ventricle has low normal function. The left ventricle has no regional  wall motion abnormalities.  2. Right ventricular systolic function is normal. The right ventricular  size is normal.  3. No left atrial/left atrial appendage thrombus was detected.  4. The mitral valve is normal in structure. Trivial mitral valve  regurgitation. No evidence of mitral stenosis.  5. The aortic valve is normal in structure. Aortic valve regurgitation is  not visualized. No aortic stenosis is present.  6. The inferior vena cava is normal in size with greater than 50%  respiratory variability, suggesting right atrial pressure of 3 mmHg.   LE arterial/venous Doppler pending.  Patient Profile     Mr. Remo is a 34M with obesity, diabetes and hypertension admitted with COVID-19/MSSA and Strep PNA.   Assessment & Plan    # Acute systolic and diastolic heart failure:  # Hypertension:  LVEF was initially 30-35% and now 50-55%.  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  He was started on metoprolol.  BNP was not elevated and he appears to be euvolemic.  Will add losartan 40m daily.   # Demand ischemia:  # CAD on CT: # Hyperlipidemia:  Hs-troponin was elevated to 121.  This is likely attributable to demand ischemia in the setting of Covid-19 viral infection.  However his family does report these had episodes of chest pain prior to admission.  CT-a of the chest has revealed coronary artery calcification.  Once he has recovered from his acute illness recommend outpatient stress versus cath.  Fortunately, LV systolic dysfunction has resolved as above.  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  We will not start aspirin.  Consider clopidogrel 75  mg daily if he will not need any surgical interventions or procedures.  His LDL goal should be less than 70.  Start rosuvastatin 10 mg daily.  Check lipids and a CMP in 3 months.  # Bacteremia: No evidence of endocarditis on TEE.  # ?Arterial embolic disease:   Right foot remains dusky.  Difficult pedal pulses to palpate.  Arterial Dopplers are pending.  There is no evidence of cardiac embolic source.  He has not had any atrial fibrillation on telemetry here.   [facility] will sign off.   Medication Recommendations: Starting losartan.  Consider adding clopidogrel. Other recommendations (labs, testing, etc): Basic metabolic panel in a week.  Repeat lipids and CMP in 2 to 3 months. Follow up as an outpatient: He will need outpatient cardiology follow-up at discharge.  He will need consideration for an ischemia evaluation.  For questions or updates, please contact [place] Please consult [email or web address] for contact info under        Signed, Skeet Latch, MD  10/25/2019, 11:45 AM

## 2019-10-25 NOTE — Evaluation (Signed)
Clinical/Bedside Swallow Evaluation Patient Details  Name: Micheal Bell MRN: [id number] Date of Birth: 10/03/1970  Today's Date: 10/25/2019 Time: SLP Start Time (ACUTE ONLY): 0919 SLP Stop Time (ACUTE ONLY): 0943 SLP Time Calculation (min) (ACUTE ONLY): 24 min  Past Medical History:  Past Medical History:  Diagnosis Date  . Arthritis   . Asthma   . CHF (congestive heart failure) (HCC)   . Diabetes mellitus without complication (HCC)   . Fibromyalgia   . Gout   . Hypertension   . IBS (irritable bowel syndrome)   . Vein disorder    Past Surgical History:  Past Surgical History:  Procedure Laterality Date  . FOOT SURGERY    . TEE WITHOUT CARDIOVERSION N/A 10/24/2019   Procedure: TRANSESOPHAGEAL ECHOCARDIOGRAM (TEE);  Surgeon: [name], MD;  Location: [facility] ENDOSCOPY;  Service: Cardiovascular;  Laterality: N/A;   HPI:  49 yo male with hx of HTN, T2DM, gout, IBS, fibromyalgia, obesity who presented on 8/16 for COVID pneumonia; decompensated on 8/19-8/28 (self extubated 8/23 and reintubated same day). Hospitalization was complicated by MSSA and GBS bacteria and found to have systolic heart failure. CXR 9/1 Hypoinflation of the lungs with mild bibasilar subsegmental atelectasis.   Assessment / Plan / Recommendation Clinical Impression  Pt need assist to initially grasp utensil and scoop food due to arm/hand weakness (especially on right) and for smaller bites. Mild throat clear following straw sip water x 2. Oral manipulation, mastication and transit unremarkable. Cognitively appropriate informally, however suspect executive functioning impairments for complex information (?). Recommend initiate regular texture/thin liquids, straws allowed, small sips and pills with liquid. Will follow up briefly.     SLP Visit Diagnosis: Dysphagia, unspecified (R13.10)    Aspiration Risk  Mild aspiration risk    Diet Recommendation Regular;Thin liquid   Liquid Administration via:  Straw;Cup Medication Administration: Whole meds with liquid Supervision: Staff to assist with self feeding Compensations: Slow rate;Small sips/bites Postural Changes: Seated upright at 90 degrees    Other  Recommendations Oral Care Recommendations: Oral care BID   Follow up Recommendations None      Frequency and Duration min 2x/week  2 weeks       Prognosis Prognosis for Safe Diet Advancement: Good      Swallow Study   General HPI: 49 yo male with hx of HTN, T2DM, gout, IBS, fibromyalgia, obesity who presented on 8/16 for COVID pneumonia; decompensated on 8/19-8/28 (self extubated 8/23 and reintubated same day). Hospitalization was complicated by MSSA and GBS bacteria and found to have systolic heart failure. CXR 9/1 Hypoinflation of the lungs with mild bibasilar subsegmental atelectasis. Type of Study: Bedside Swallow Evaluation Previous Swallow Assessment:  (none) Diet Prior to this Study: NPO Temperature Spikes Noted: No Respiratory Status: Nasal cannula History of Recent Intubation: Yes Length of Intubations (days):  (9) Date extubated: 10/21/19 Behavior/Cognition: Alert;Cooperative;Pleasant mood;Requires cueing Oral Cavity Assessment: Other (comment) (lingual candidias) Oral Care Completed by SLP: No Oral Cavity - Dentition: Adequate natural dentition Vision: Functional for self-feeding Self-Feeding Abilities: Needs assist Patient Positioning: Upright in bed Baseline Vocal Quality: Normal Volitional Cough: Strong Volitional Swallow: Able to elicit    Oral/Motor/Sensory Function Overall Oral Motor/Sensory Function: Within functional limits (mild tremors)   Ice Chips Ice chips: Not tested   Thin Liquid Thin Liquid: Impaired Presentation: Cup;Straw Oral Phase Impairments:  (none) Pharyngeal  Phase Impairments: Throat Clearing - Immediate    Nectar Thick Nectar Thick Liquid: Not tested   Honey Thick Honey Thick Liquid: Not tested  Puree Puree: Not tested    Solid     Solid: Within functional limits      Royce Macadamia 10/25/2019,10:00 AM   Breck Coons Lonell Face.Ed Nurse, children's [phone] Office [phone]

## 2019-10-25 NOTE — Progress Notes (Signed)
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 Pt with significant decline in functional status, requiring Max A +2 with mobility using lift equipment Antony Salmon), total A for LB ADL and Max A for UB ADL due to deficits listed below. R side overall weaker than L, especially LLE.  Apparent cognitive deficits and lack of insight/awareness into deficits. VSS with SpO2 89-96 on RA throughout session. VSS. Facetimed Mom at end of session. Pt will need post acute rehab. Recommend CIR however will most likely need 24/7 assistance after CIR.  Recommend staff mobilize pt to recliner daily with use of Maximove    10/25/19 1755  OT Visit Information  Last OT Received On 10/25/19  Assistance Needed +2  PT/OT/SLP Co-Evaluation/Treatment Yes  Reason for Co-Treatment Complexity of the patient's impairments (multi-system involvement);Necessary to address cognition/behavior during functional activity;For patient/therapist safety  OT goals addressed during session ADL's and self-care;Strengthening/ROM  History of Present Illness Pt is a 49 y.o. male admitted 10/09/19 for COVID PNA. Pt decompensated 8/19 and intubated; self extubated 8/23 and reintubated; extubated 8/28. Course complicated by MSSA and GBS bacteria, sepsis, systolic HF, acute metabolic encephalopathy. Head CT with subcortical low density of posterior L frontal lobe, likely encephalomalacia and sequela of remote infarct; MRI with acute/subacute L frontoparietal infarct. TEE without evidence of vegitation. L foot diabetic wound; MRI pending. PMH includes fibromyalgia, DM, CHF, asthma, HTN.  Precautions  Precautions Fall  Precaution Comments R heel wound  Home Living  Family/patient expects to be discharged to: Private residence  Living Arrangements Alone  Available Help at Discharge Family;Available PRN/intermittently  Type of Home Other(Comment)   Home Access Ramped entrance  Home Layout One level  Bathroom Shower/Tub Tub/shower unit  Tour manager None  Prior Function  Level of Independence Independent  Comments Reports he was caring for his grandfather  Communication  Communication No difficulties  Pain Assessment  Pain Assessment Faces  Faces Pain Scale 4  Pain Location L shoulder/general discomfort  Pain Descriptors / Indicators Discomfort;Grimacing  Pain Intervention(s) Limited activity within patient's tolerance  Cognition  Arousal/Alertness Awake/alert  Behavior During Therapy WFL for tasks assessed/performed  Overall Cognitive Status Impaired/Different from baseline  Area of Impairment Orientation;Attention;Memory;Following commands;Safety/judgement;Awareness;Problem solving  Orientation Level Disoriented to;Time  Current Attention Level Sustained  Memory Decreased short-term memory  Following Commands Follows one step commands inconsistently;Follows one step commands with increased time  Safety/Judgement Decreased awareness of safety;Decreased awareness of deficits  Awareness Intellectual  Problem Solving Slow processing;Decreased initiation;Difficulty sequencing;Requires verbal cues;Requires tactile cues  General Comments Easily distracted requiring frequent cues to reattend to task. Poor insight into deficits, "Let me walk to the bathroom so I can pee"  Upper Extremity Assessment  Upper Extremity Assessment RUE deficits/detail;LUE deficits/detail  RUE Deficits / Details Hx of RTC insufficiency B; apparent wasting of intrinsics/hand with claw hand deformity; elbow/wrist AROM WFL strenght @ 3+/5; grip strength @ 3/5; poor inhnad manipulation skillsweaker than LUE; Abduction 3/5  RUE Coordination decreased fine motor;decreased gross motor  LUE Deficits / Details RTC insufficiency; able to hold arm into abduction once placed @ 3+/5; elbow/rist hand AROM WFL; strength @ 3+/5; decreased  in-hand manipulation skills  LUE Coordination decreased fine motor;decreased gross motor  Lower Extremity Assessment  Lower Extremity Assessment Defer to PT evaluation  Cervical / Trunk Assessment  Cervical / Trunk Assessment Kyphotic;Other exceptions (forward head; posterior bias in sitting;  R lat lean)  ADL  Overall ADL's  Needs assistance/impaired  Eating/Feeding Minimal assistance;Sitting  Grooming Moderate assistance;Sitting  Upper Body Bathing Maximal assistance;Sitting  Lower Body Bathing Maximal assistance;Bed level  Lower Body Bathing Details (indicate cue type and reason) Pt washing upper thighs/can reach groin  Upper Body Dressing  Maximal assistance  Lower Body Dressing Total assistance  Toileting- Clothing Manipulation and Hygiene Total assistance  Functional mobility during ADLs Maximal assistance;+2 for physical assistance;Cueing for safety;Cueing for sequencing (with use of Stedy)  Vision- History  Baseline Vision/History Wears glasses  Wears Glasses At all times  Vision- Assessment  Additional Comments glasses not in room; will further assess vision  Perception  Spatial deficits poor spatical awareness; will assess  Praxis  Praxis tested? Deficits  Deficits Initiation  Praxis-Other Comments cues for sequencing  Bed Mobility  Overal bed mobility Needs Assistance  Bed Mobility Supine to Sit;Sit to Supine  Supine to sit Max assist;HOB elevated  Sit to supine Max assist;+2 for physical assistance  Transfers  Overall transfer level Needs assistance  Transfer via Lift Equipment Stedy  Transfers Sit to/from Stand  Sit to Stand Max assist;+2 physical assistance;From elevated surface  General transfer comment MaxA+2 to elevate trunk with bed pad into Stedy lift, pt assisting well with LUE support and initiating hips extension with verbal/tactile cues; bilateral knee instability (R>L) blocked by stedy frame  Balance  Overall balance assessment Needs assistance   Sitting balance-Leahy Scale Poor  Standing balance-Leahy Scale Zero  OT - End of Session  Activity Tolerance Patient tolerated treatment well  Patient left in bed;with call bell/phone within reach;with bed alarm set  Nurse Communication Mobility status;Need for lift equipment  OT Assessment  OT Recommendation/Assessment Patient needs continued OT Services  OT Visit Diagnosis Unsteadiness on feet (R26.81);Other abnormalities of gait and mobility (R26.89);Muscle weakness (generalized) (M62.81);Other symptoms and signs involving cognitive function;Hemiplegia and hemiparesis;Pain  Hemiplegia - Right/Left Right  Hemiplegia - dominant/non-dominant Dominant  Hemiplegia - caused by Cerebral infarction  Pain - Right/Left Left  Pain - part of body Shoulder  OT Problem List Decreased strength;Decreased range of motion;Decreased activity tolerance;Impaired balance (sitting and/or standing);Impaired vision/perception;Decreased coordination;Decreased cognition;Decreased safety awareness;Decreased knowledge of use of DME or AE;Cardiopulmonary status limiting activity;Impaired sensation;Impaired tone;Obesity;Impaired UE functional use;Pain  OT Plan  OT Frequency (ACUTE ONLY) Min 2X/week  OT Treatment/Interventions (ACUTE ONLY) Self-care/ADL training;Therapeutic exercise;Neuromuscular education;Energy conservation;DME and/or AE instruction;Therapeutic activities;Cognitive remediation/compensation;Visual/perceptual remediation/compensation;Patient/family education;Balance training  AM-PAC OT "6 Clicks" Daily Activity Outcome Measure (Version 2)  Help from another person eating meals? 3  Help from another person taking care of personal grooming? 2  Help from another person toileting, which includes using toliet, bedpan, or urinal? 1  Help from another person bathing (including washing, rinsing, drying)? 2  Help from another person to put on and taking off regular upper body clothing? 2  Help from another  person to put on and taking off regular lower body clothing? 1  6 Click Score 11  OT Recommendation  Recommendations for Other Services Rehab consult;Speech consult  Follow Up Recommendations CIR;Supervision/Assistance - 24 hour  OT Equipment 3 in 1 bedside commode;Hospital bed;Wheelchair (measurements OT);Wheelchair cushion (measurements OT)  Individuals Consulted  Consulted and Agree with Results and Recommendations Patient;Family member/caregiver  Family Member Consulted Mom via Facetime  Acute Rehab OT Goals  Patient Stated Goal get stronger and see "Sparky"  OT Goal Formulation With patient/family  Time For Goal Achievement 11/08/19  Potential to Achieve Goals Good  OT Time Calculation  OT Start Time (ACUTE ONLY) 1607  OT Stop Time (ACUTE ONLY) 1700  OT Time Calculation (min) 53 min  OT General Charges  $OT Visit 1 Visit  OT Evaluation  $OT Eval Moderate Complexity 1 Mod  OT Treatments  $Self Care/Home Management  8-22 mins  Written Expression  Dominant Hand Right  Luisa Dago, OT/L   Acute OT Clinical Specialist Acute Rehabilitation Services Pager [phone] Office [phone]

## 2019-10-25 NOTE — Evaluation (Signed)
Physical Therapy Evaluation Patient Details Name: Micheal Bell MRN: [id number] DOB: 1970-04-17 Today's Date: 10/25/2019   History of Present Illness  Pt is a 49 y.o. male admitted 10/09/19 for COVID PNA. Pt decompensated 8/19 and intubated; self extubated 8/23 and reintubated; extubated 8/28. Course complicated by MSSA and GBS bacteria, sepsis, systolic HF, acute metabolic encephalopathy. Head CT with subcortical low density of posterior L frontal lobe, likely encephalomalacia and sequela of remote infarct; MRI with acute/subacute L frontoparietal infarct. TEE without evidence of vegitation. L foot diabetic wound; MRI pending. PMH includes fibromyalgia, DM, CHF, asthma, HTN.    Clinical Impression  Pt presents with an overall decrease in functional mobility secondary to above. PTA, pt independent, lives alone with family next door. Today, pt presents with significant weakness (RUE/RLE > LUE/LLE) from prolonged bedrest with intubation, decreased activity tolerance, and cognitive impairments, including decreased awareness, poor problem solving and impaired attention. Pt pleasant and motivated to participate, required maxA+2 to stand with Stedy frame. Feel pt would benefit from intensive CIR-level therapies to maximize functional mobility and independence prior to d/c home.   SpO2 89-96% on RA    Follow Up Recommendations CIR;Supervision/Assistance - 24 hour    Equipment Recommendations   (defer)    Recommendations for Other Services       Precautions / Restrictions Precautions Precautions: Fall Restrictions Weight Bearing Restrictions: No      Mobility  Bed Mobility Overal bed mobility: Needs Assistance Bed Mobility: Supine to Sit;Sit to Supine     Supine to sit: Max assist;HOB elevated Sit to supine: Max assist;+2 for physical assistance   General bed mobility comments: MaxA to assist hips to EOB and elevate trunk; repeated cues for sequencing, pt easily  distracted  Transfers Overall transfer level: Needs assistance   Transfers: Sit to/from Stand Sit to Stand: Max assist;+2 physical assistance;From elevated surface         General transfer comment: MaxA+2 to elevate trunk with bed pad into Stedy lift, pt assisting well with LUE support and initiating hips extension with verbal/tactile cues; bilateral knee instability (R>L) blocked by stedy frame  Ambulation/Gait             General Gait Details: Unable  Stairs            Wheelchair Mobility    Modified Rankin (Stroke Patients Only)       Balance Overall balance assessment: Needs assistance   Sitting balance-Leahy Scale: Poor Sitting balance - Comments: R lateral lean, reliant on maxA for trnk support or LUE support on bed rail     Standing balance-Leahy Scale: Zero Standing balance comment: Reliant on UE support and maxA+2 to maintain static standing in stedy frame with bilateral knees blocked                             Pertinent Vitals/Pain Pain Assessment: No/denies pain    Home Living Family/patient expects to be discharged to:: Private residence Living Arrangements: Alone Available Help at Discharge: Family;Available PRN/intermittently Type of Home:  (camper trailer) Home Access: Ramped entrance     Home Layout: One level Home Equipment: (P) None Additional Comments: Lives alone with his dog Sparky. Camper is parked behind his mother's house    Prior Function Level of Independence: Independent         Comments: Reports he was caring for his grandfather     Hand Dominance        Extremity/Trunk  Assessment   Upper Extremity Assessment Upper Extremity Assessment: RUE deficits/detail;LUE deficits/detail;Defer to OT evaluation    Lower Extremity Assessment Lower Extremity Assessment: RLE deficits/detail;LLE deficits/detail RLE Deficits / Details: Functionally <2-3/5 throughout; R foot wound awaiting MRI (dressing  applied) LLE Deficits / Details: Hip flex <3/5, knee flex/ext 3/5, ankle PF/DF <3/5       Communication   Communication: No difficulties  Cognition Arousal/Alertness: Awake/alert Behavior During Therapy: WFL for tasks assessed/performed Overall Cognitive Status: Impaired/Different from baseline Area of Impairment: Orientation;Attention;Memory;Following commands;Safety/judgement;Awareness;Problem solving                 Orientation Level: Disoriented to;Time Current Attention Level: Sustained Memory: Decreased short-term memory Following Commands: Follows one step commands inconsistently;Follows one step commands with increased time Safety/Judgement: Decreased awareness of safety;Decreased awareness of deficits Awareness: Intellectual Problem Solving: Slow processing;Decreased initiation;Difficulty sequencing;Requires verbal cues General Comments: Easily distracted requiring frequent cues to reattend to task. Poor insight into deficits, "Let me walk to the bathroom so I can pee"      General Comments General comments (skin integrity, edema, etc.): SpO2 89-96% on RA    Exercises     Assessment/Plan    PT Assessment Patient needs continued PT services  PT Problem List Decreased strength;Decreased activity tolerance;Decreased balance;Decreased mobility;Decreased cognition;Decreased coordination;Decreased knowledge of use of DME;Decreased safety awareness;Obesity;Decreased skin integrity       PT Treatment Interventions DME instruction;Gait training;Functional mobility training;Therapeutic activities;Therapeutic exercise;Balance training;Neuromuscular re-education;Cognitive remediation;Patient/family education    PT Goals (Current goals can be found in the Care Plan section)  Acute Rehab PT Goals Patient Stated Goal: "Walk to the bathroom so I can pee" PT Goal Formulation: With patient Time For Goal Achievement: 11/08/19 Potential to Achieve Goals: Fair    Frequency Min  4X/week   Barriers to discharge        Co-evaluation PT/OT/SLP Co-Evaluation/Treatment: Yes Reason for Co-Treatment: Complexity of the patient's impairments (multi-system involvement);Necessary to address cognition/behavior during functional activity;For patient/therapist safety;To address functional/ADL transfers PT goals addressed during session: Mobility/safety with mobility         AM-PAC PT "6 Clicks" Mobility  Outcome Measure Help needed turning from your back to your side while in a flat bed without using bedrails?: A Lot Help needed moving from lying on your back to sitting on the side of a flat bed without using bedrails?: A Lot Help needed moving to and from a bed to a chair (including a wheelchair)?: Total Help needed standing up from a chair using your arms (e.g., wheelchair or bedside chair)?: Total Help needed to walk in hospital room?: Total Help needed climbing 3-5 steps with a railing? : Total 6 Click Score: 8    End of Session   Activity Tolerance: Patient tolerated treatment well Patient left: in bed;with call bell/phone within reach;with bed alarm set Nurse Communication: Mobility status;Need for lift equipment PT Visit Diagnosis: Other abnormalities of gait and mobility (R26.89);Muscle weakness (generalized) (M62.81);Other symptoms and signs involving the nervous system (R29.898)    Time: 3790-2409 PT Time Calculation (min) (ACUTE ONLY): 60 min   Charges:   PT Evaluation $PT Eval Moderate Complexity: 1 Mod PT Treatments $Therapeutic Activity: 8-22 mins       Ina Homes, PT, DPT Acute Rehabilitation Services  Pager [phone] Office [phone]  [name] 10/25/2019, 5:43 PM

## 2019-10-25 NOTE — Progress Notes (Signed)
PT Cancellation Note  Patient Details Name: Micheal Bell MRN: [id number] DOB: 1970-04-23   Cancelled Treatment:    Reason Eval/Treat Not Completed: Patient at procedure or test/unavailable (MRI). Will follow-up for PT Evaluation as schedule permits.  Ina Homes, PT, DPT Acute Rehabilitation Services  Pager [phone] Office [phone]  [name] 10/25/2019, 2:44 PM

## 2019-10-26 ENCOUNTER — Inpatient Hospital Stay (HOSPITAL_COMMUNITY): Payer: Medicaid Other

## 2019-10-26 ENCOUNTER — Encounter (HOSPITAL_COMMUNITY): Payer: Self-pay | Admitting: Anesthesiology

## 2019-10-26 ENCOUNTER — Other Ambulatory Visit: Payer: Self-pay | Admitting: Physician Assistant

## 2019-10-26 DIAGNOSIS — R7881 Bacteremia: Secondary | ICD-10-CM

## 2019-10-26 DIAGNOSIS — L02612 Cutaneous abscess of left foot: Secondary | ICD-10-CM

## 2019-10-26 DIAGNOSIS — R569 Unspecified convulsions: Secondary | ICD-10-CM

## 2019-10-26 DIAGNOSIS — L0390 Cellulitis, unspecified: Secondary | ICD-10-CM

## 2019-10-26 DIAGNOSIS — I639 Cerebral infarction, unspecified: Secondary | ICD-10-CM

## 2019-10-26 DIAGNOSIS — E118 Type 2 diabetes mellitus with unspecified complications: Secondary | ICD-10-CM

## 2019-10-26 DIAGNOSIS — B9561 Methicillin susceptible Staphylococcus aureus infection as the cause of diseases classified elsewhere: Secondary | ICD-10-CM

## 2019-10-26 LAB — COMPREHENSIVE METABOLIC PANEL
ALT: 16 U/L (ref 0–44)
AST: 23 U/L (ref 15–41)
Albumin: 2.3 g/dL — ABNORMAL LOW (ref 3.5–5.0)
Alkaline Phosphatase: 89 U/L (ref 38–126)
Anion gap: 9 (ref 5–15)
BUN: 12 mg/dL (ref 6–20)
CO2: 24 mmol/L (ref 22–32)
Calcium: 8.6 mg/dL — ABNORMAL LOW (ref 8.9–10.3)
Chloride: 101 mmol/L (ref 98–111)
Creatinine, Ser: 0.75 mg/dL (ref 0.61–1.24)
GFR calc Af Amer: >60 mL/min (ref >60)
GFR calc non Af Amer: >60 mL/min (ref >60)
Glucose, Bld: 111 mg/dL — ABNORMAL HIGH (ref 70–99)
Potassium: 3.5 mmol/L (ref 3.5–5.1)
Sodium: 134 mmol/L — ABNORMAL LOW (ref 135–145)
Total Bilirubin: 1.2 mg/dL (ref 0.3–1.2)
Total Protein: 6.3 g/dL — ABNORMAL LOW (ref 6.5–8.1)

## 2019-10-26 LAB — D-DIMER, QUANTITATIVE: D-Dimer, Quant: 6.29 ug/mL-FEU — ABNORMAL HIGH (ref 0.00–0.50)

## 2019-10-26 LAB — CBC WITH DIFFERENTIAL/PLATELET
Abs Immature Granulocytes: 0.19 10*3/uL — ABNORMAL HIGH (ref 0.00–0.07)
Basophils Absolute: 0.1 10*3/uL (ref 0.0–0.1)
Basophils Relative: 0 %
Eosinophils Absolute: 0.1 10*3/uL (ref 0.0–0.5)
Eosinophils Relative: 1 %
HCT: 34.3 % — ABNORMAL LOW (ref 39.0–52.0)
Hemoglobin: 11.2 g/dL — ABNORMAL LOW (ref 13.0–17.0)
Immature Granulocytes: 1 %
Lymphocytes Relative: 17 %
Lymphs Abs: 2.4 10*3/uL (ref 0.7–4.0)
MCH: 30.6 pg (ref 26.0–34.0)
MCHC: 32.7 g/dL (ref 30.0–36.0)
MCV: 93.7 fL (ref 80.0–100.0)
Monocytes Absolute: 0.8 10*3/uL (ref 0.1–1.0)
Monocytes Relative: 6 %
Neutro Abs: 10.7 10*3/uL — ABNORMAL HIGH (ref 1.7–7.7)
Neutrophils Relative %: 75 %
Platelets: 396 10*3/uL (ref 150–400)
RBC: 3.66 MIL/uL — ABNORMAL LOW (ref 4.22–5.81)
RDW: 12.3 % (ref 11.5–15.5)
WBC: 14.3 10*3/uL — ABNORMAL HIGH (ref 4.0–10.5)
nRBC: 0 % (ref 0.0–0.2)

## 2019-10-26 LAB — GLUCOSE, CAPILLARY
Glucose-Capillary: 101 mg/dL — ABNORMAL HIGH (ref 70–99)
Glucose-Capillary: 113 mg/dL — ABNORMAL HIGH (ref 70–99)
Glucose-Capillary: 132 mg/dL — ABNORMAL HIGH (ref 70–99)
Glucose-Capillary: 146 mg/dL — ABNORMAL HIGH (ref 70–99)
Glucose-Capillary: 209 mg/dL — ABNORMAL HIGH (ref 70–99)
Glucose-Capillary: 232 mg/dL — ABNORMAL HIGH (ref 70–99)
Glucose-Capillary: 253 mg/dL — ABNORMAL HIGH (ref 70–99)
Glucose-Capillary: 266 mg/dL — ABNORMAL HIGH (ref 70–99)

## 2019-10-26 LAB — PROCALCITONIN: Procalcitonin: <0.1 ng/mL

## 2019-10-26 LAB — LIPID PANEL
Cholesterol: 168 mg/dL (ref 0–200)
HDL: 43 mg/dL (ref >40)
LDL Cholesterol: 99 mg/dL (ref 0–99)
Total CHOL/HDL Ratio: 3.9 RATIO
Triglycerides: 130 mg/dL (ref <150)
VLDL: 26 mg/dL (ref 0–40)

## 2019-10-26 LAB — BRAIN NATRIURETIC PEPTIDE: B Natriuretic Peptide: 38 pg/mL (ref 0.0–100.0)

## 2019-10-26 LAB — HEMOGLOBIN A1C
Hgb A1c MFr Bld: 11.3 % — ABNORMAL HIGH (ref 4.8–5.6)
Mean Plasma Glucose: 277.61 mg/dL

## 2019-10-26 LAB — MAGNESIUM: Magnesium: 1.5 mg/dL — ABNORMAL LOW (ref 1.7–2.4)

## 2019-10-26 LAB — C-REACTIVE PROTEIN: CRP: 5.4 mg/dL — ABNORMAL HIGH (ref <1.0)

## 2019-10-26 IMAGING — DX DG CHEST 1V PORT
1 series · 1 of 1 positions shown · non-contrast
Comparison: [DATE]

CLINICAL DATA: Short of breath.  COVID positive

EXAM:
PORTABLE CHEST 1 VIEW

[chest]
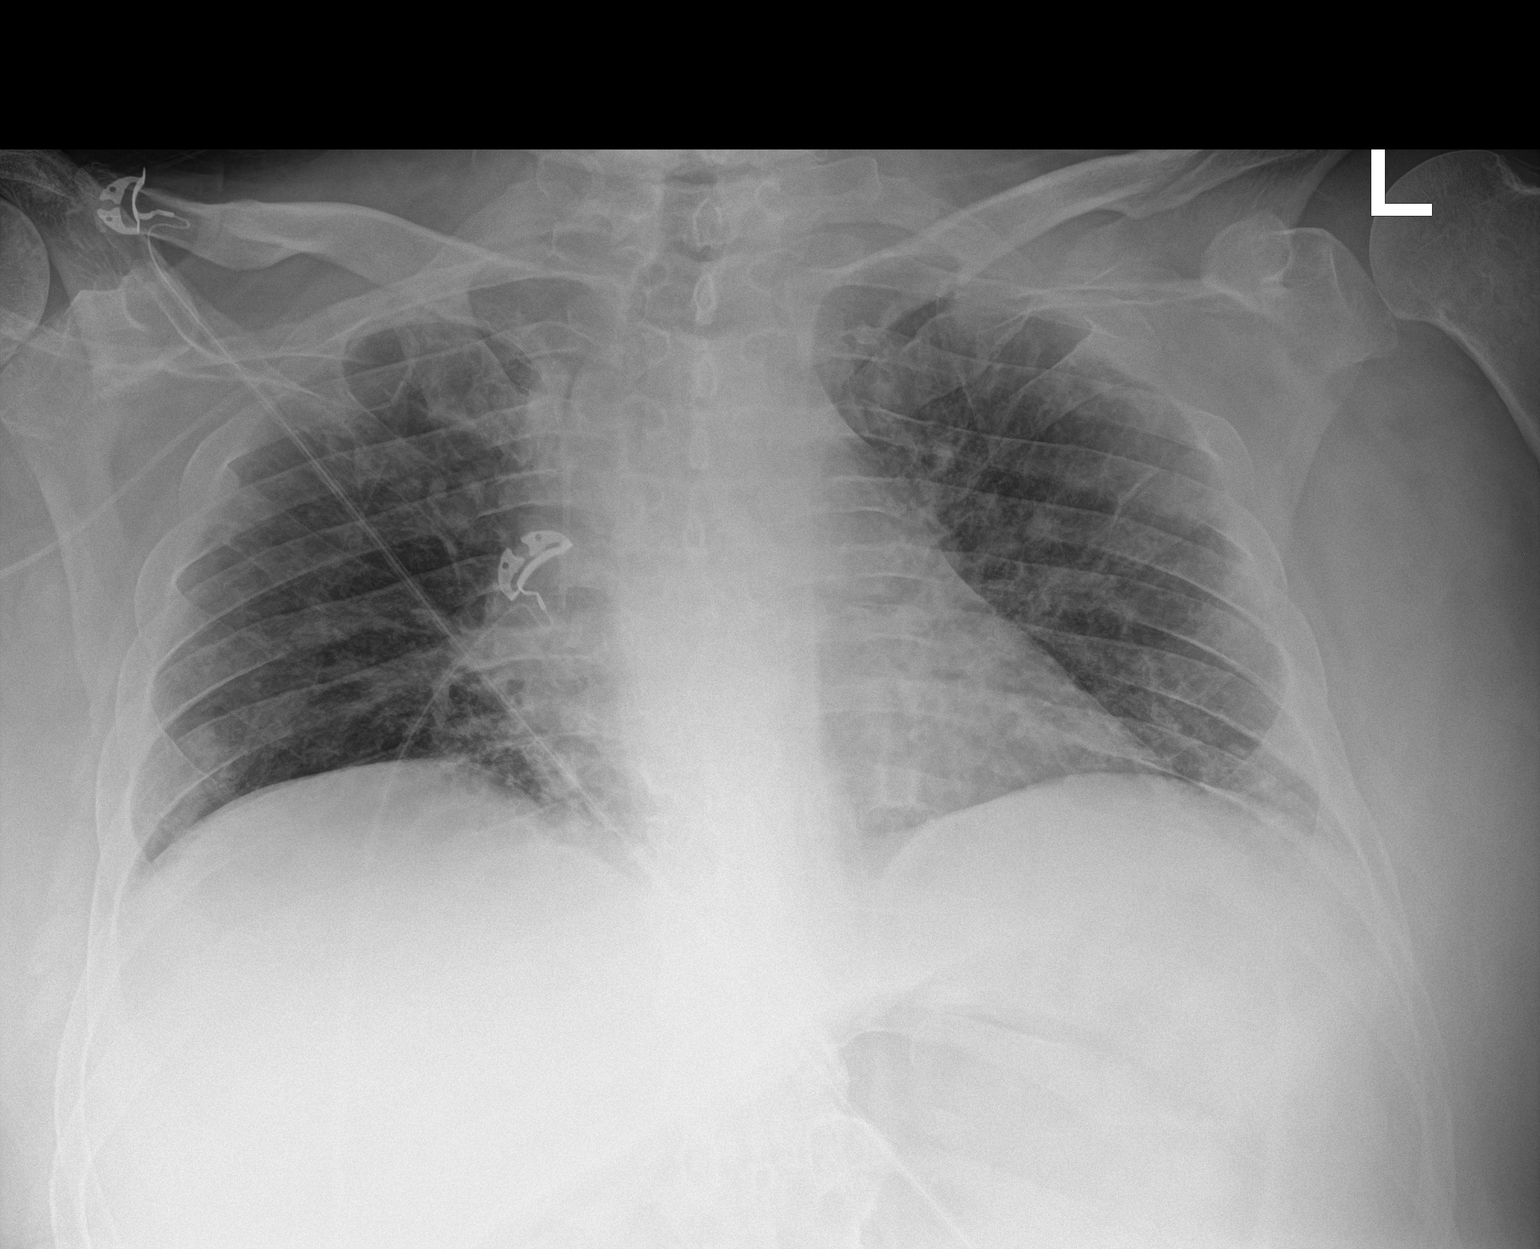

[1 of 1 positions shown; findings below may reference images not displayed]

FINDINGS: Hypoventilation with decreased lung volume. Mild bibasilar airspace
disease unchanged. No new infiltrate or effusion. Right arm PICC tip
in the SVC unchanged.
IMPRESSION: Hypoventilation with bibasilar mild airspace disease unchanged.

## 2019-10-26 MED ORDER — ATORVASTATIN CALCIUM 40 MG PO TABS
40.0000 mg | ORAL_TABLET | Freq: Every day | ORAL | Status: DC
  Administered 2019-10-26 – 2019-11-15 (×21): 40 mg via ORAL
  Filled 2019-10-26 (×21): qty 1

## 2019-10-26 MED ORDER — SODIUM CHLORIDE 0.9 % IV SOLN
2.0000 g | INTRAVENOUS | Status: DC
  Filled 2019-10-26 (×3): qty 2000

## 2019-10-26 MED ORDER — MAGNESIUM SULFATE 2 GM/50ML IV SOLN
2.0000 g | Freq: Once | INTRAVENOUS | Status: AC
  Administered 2019-10-26: 2 g via INTRAVENOUS
  Filled 2019-10-26: qty 50

## 2019-10-26 MED ORDER — POTASSIUM CHLORIDE CRYS ER 20 MEQ PO TBCR
40.0000 meq | EXTENDED_RELEASE_TABLET | Freq: Once | ORAL | Status: AC
  Administered 2019-10-26: 40 meq via ORAL
  Filled 2019-10-26: qty 2

## 2019-10-26 MED ORDER — METOPROLOL TARTRATE 50 MG PO TABS
50.0000 mg | ORAL_TABLET | Freq: Two times a day (BID) | ORAL | Status: DC
  Administered 2019-10-26 – 2019-11-11 (×33): 50 mg via ORAL
  Filled 2019-10-26 (×34): qty 1

## 2019-10-26 MED ORDER — NAFCILLIN SODIUM 2 G IJ SOLR
12.0000 g | Freq: Every day | INTRAMUSCULAR | Status: DC
  Filled 2019-10-26: qty 12000

## 2019-10-26 MED ORDER — NAFCILLIN SODIUM 1 G IJ SOLR
12.0000 g | INTRAVENOUS | Status: DC
  Filled 2019-10-26: qty 12000

## 2019-10-26 MED ORDER — NAFCILLIN SODIUM 2 G IJ SOLR
12.0000 g | INTRAVENOUS | Status: DC
  Administered 2019-10-26 – 2019-10-28 (×3): 12 g via INTRAVENOUS
  Filled 2019-10-26 (×4): qty 12000

## 2019-10-26 MED ORDER — LACTATED RINGERS IV BOLUS
1000.0000 mL | Freq: Once | INTRAVENOUS | Status: AC
  Administered 2019-10-26: 1000 mL via INTRAVENOUS

## 2019-10-26 NOTE — Evaluation (Addendum)
SLP present during session and agrees with student's results and recommendations.   Micheal Bell [place] Sports administrator Pager [phone] Office [phone]      Speech Language Pathology Evaluation Patient Details Name: Micheal Bell MRN: [id number] DOB: 03-Nov-1970 Today's Date: 10/26/2019 Time: 4332-9518 SLP Time Calculation (min) (ACUTE ONLY): 27 min  Problem List:  Patient Active Problem List   Diagnosis Date Noted  . Acute respiratory failure with hypoxemia (HCC)   . Diabetic foot (HCC)   . Acute systolic heart failure (HCC)   . Embolic infarction (HCC)   . Hyperglycemia   . Hypoxia   . Lower respiratory tract infection due to COVID-19 virus 10/10/2019  . Benign hypertension 07/23/2015  . Gastro-esophageal reflux disease with esophagitis 07/23/2015  . Gout 07/23/2015  . Obstructive sleep apnea 07/23/2015  . Snoring 07/23/2015  . Chronic idiopathic gout of multiple sites 06/02/2015  . Class 2 obesity in adult 06/02/2015  . Fibromyalgia 06/02/2015  . Irritable bowel syndrome with diarrhea 06/02/2015  . Mild persistent asthma 06/02/2015  . Reaction, adjustment, with depressed mood, prolonged 06/02/2015  . Type 2 diabetes mellitus without complication, without long-term current use of insulin (HCC) 06/02/2015   Past Medical History:  Past Medical History:  Diagnosis Date  . Arthritis   . Asthma   . CHF (congestive heart failure) (HCC)   . Diabetes mellitus without complication (HCC)   . Fibromyalgia   . Gout   . Hypertension   . IBS (irritable bowel syndrome)   . Vein disorder    Past Surgical History:  Past Surgical History:  Procedure Laterality Date  . FOOT SURGERY    . TEE WITHOUT CARDIOVERSION N/A 10/24/2019   Procedure: TRANSESOPHAGEAL ECHOCARDIOGRAM (TEE);  Surgeon: [name], MD;  Location: [facility] ENDOSCOPY;  Service: Cardiovascular;  Laterality: N/A;   HPI:  49 yo male with hx of HTN, T2DM, gout, IBS, fibromyalgia, obesity  who presented on 8/16 for COVID pneumonia; decompensated on 8/19-8/28 (self extubated 8/23 and reintubated same day). Hospitalization was complicated by MSSA and GBS bacteria and found to have systolic heart failure. CXR 9/1 Hypoinflation of the lungs with mild bibasilar subsegmental atelectasis.   Assessment / Plan / Recommendation Clinical Impression  Pt was alert and OX3 during SLE. Pt has been unemployed and is responsible for finances and medical management. Lives with mom. He acheived a score of 10/26 on the SLUMS, revealing several areas of impairement. Pt was attentive to the eval and demonstrated adequate focused and sustained attention. He demonstrated memory deficits with retrieval and recall of new information. All areas of awareness were observed to be impaired. The pt demonstrated great difficulty with executive function and processing of higher-level information. Auditory comprehension and verbal expression observed to be WNL. Speech intelligibility WNL. Recommend SLP f/u to address cognitive impairments.     SLP Assessment  SLP Recommendation/Assessment: Patient needs continued Speech Lanaguage Pathology Services SLP Visit Diagnosis: Cognitive communication deficit (R41.841)    Follow Up Recommendations       Frequency and Duration min 2x/week  2 weeks      SLP Evaluation Cognition  Overall Cognitive Status: Impaired/Different from baseline Arousal/Alertness: Awake/alert Orientation Level: Oriented to place;Oriented to person;Oriented to time;Disoriented to situation Attention: Sustained;Focused (suspect higher level attention difficulties) Focused Attention: Appears intact Sustained Attention: Appears intact Memory: Impaired Memory Impairment: Decreased recall of new information;Retrieval deficit Awareness: Impaired Awareness Impairment: Intellectual impairment;Emergent impairment;Anticipatory impairment Problem Solving: Impaired Problem Solving Impairment: Verbal  complex;Functional complex Executive Function: Reasoning;Organizing;Decision  Making;Self Correcting;Self Monitoring Reasoning: Impaired Reasoning Impairment: Functional complex;Verbal complex Organizing: Impaired Organizing Impairment: Verbal complex;Functional complex Decision Making: Impaired Decision Making Impairment: Verbal complex;Functional complex Self Monitoring: Impaired Self Monitoring Impairment: Verbal complex;Functional complex Self Correcting: Impaired Self Correcting Impairment: Verbal complex;Functional complex Safety/Judgment: Impaired       Comprehension  Auditory Comprehension Overall Auditory Comprehension: Appears within functional limits for tasks assessed Visual Recognition/Discrimination Discrimination: Not tested Reading Comprehension Reading Status: Not tested    Expression Expression Primary Mode of Expression: Verbal Verbal Expression Overall Verbal Expression: Appears within functional limits for tasks assessed Initiation: No impairment Automatic Speech: Name;Social Response;Day of week Level of Generative/Spontaneous Verbalization: Word;Phrase;Sentence;Conversation Written Expression Dominant Hand: Right Written Expression: Unable to assess (comment) (RUE weakness)   Oral / Motor  Oral Motor/Sensory Function Overall Oral Motor/Sensory Function: Within functional limits Motor Speech Overall Motor Speech: Appears within functional limits for tasks assessed Respiration: Within functional limits Phonation: Normal Resonance: Within functional limits Articulation: Within functional limitis Intelligibility: Intelligible Motor Planning: Witnin functional limits Motor Speech Errors: Not applicable   GO                    Royetta Crochet 10/26/2019, 2:57 PM

## 2019-10-26 NOTE — Progress Notes (Signed)
Inpatient Rehab Admissions Coordinator Note:   Per therapy recommendations, pt was screened for CIR candidacy by Estill Dooms, PT, DPT.  Per Infectious Disease note, pt with severe Covid and to be on isolation x21 days, which will expire on 9/6.  We will follow up with patient at that time.  Please contact me with questions.   Estill Dooms, PT, DPT [phone] 10/26/19 9:39 AM

## 2019-10-26 NOTE — Progress Notes (Signed)
[facility]   Reason for visit: Follow up on bacteremia  Interval History: MRI c/w osteomyelitis, MRI head c/w cerebritis; no new complaints.  No associated n/v/d/rash.    Physical Exam: Constitutional:  Vitals:   10/26/19 0328 10/26/19 0732  BP: 119/67 123/73  Pulse: 84 87  Resp: 19 16  Temp: 98.4 F (36.9 C) 98.3 F (36.8 C)  SpO2: 91% 90%   patient appears in NAD Respiratory: Normal respiratory effort; CTA B Cardiovascular: RRR GI: soft, nt, nd Neuro: alert and oriented.  More awake than previous and interactive  Review of Systems: Constitutional: negative for fevers and chills Gastrointestinal: negative for nausea and diarrhea  Lab Results  Component Value Date   WBC 14.3 (H) 10/26/2019   HGB 11.2 (L) 10/26/2019   HCT 34.3 (L) 10/26/2019   MCV 93.7 10/26/2019   PLT 396 10/26/2019    Lab Results  Component Value Date   CREATININE 0.75 10/26/2019   BUN 12 10/26/2019   NA 134 (L) 10/26/2019   K 3.5 10/26/2019   CL 101 10/26/2019   CO2 24 10/26/2019    Lab Results  Component Value Date   ALT 16 10/26/2019   AST 23 10/26/2019   ALKPHOS 89 10/26/2019     Microbiology: Recent Results (from the past 240 hour(s))  MRSA PCR Screening     Status: None   Collection Time: 10/17/19  8:51 AM   Specimen: Nasal Mucosa; Nasopharyngeal  Result Value Ref Range Status   MRSA by PCR NEGATIVE NEGATIVE Final    Comment:        The GeneXpert MRSA Assay (FDA approved for NASAL specimens only), is one component of a comprehensive MRSA colonization surveillance program. It is not intended to diagnose MRSA infection nor to guide or monitor treatment for MRSA infections. Performed at [facility], [address] [place], [place] [address]   Culture, blood (routine x 2)     Status: None (Preliminary result)   Collection Time: 10/23/19 10:58 AM   Specimen: BLOOD  Result Value Ref Range Status   Specimen Description BLOOD LEFT  ANTECUBITAL  Final   Special Requests   Final    BOTTLES DRAWN AEROBIC ONLY Blood Culture adequate volume   Culture   Final    NO GROWTH 3 DAYS Performed at [facility], [address] [place], [place] [address]    Report Status PENDING  Incomplete  Culture, blood (routine x 2)     Status: None (Preliminary result)   Collection Time: 10/23/19 11:00 AM   Specimen: BLOOD LEFT HAND  Result Value Ref Range Status   Specimen Description BLOOD LEFT HAND  Final   Special Requests   Final    BOTTLES DRAWN AEROBIC ONLY Blood Culture adequate volume   Culture   Final    NO GROWTH 3 DAYS Performed at [facility], [address] [place], [place] [address]    Report Status PENDING  Incomplete    Impression/Plan:  1. Bacteremia - MSSA and GBS, likely source is foot.  Repeat blood cultures have remained negative.   Has a picc line already.   2.  Encephalopathy - MRi c/w cerebritis and had been on cefazolin.  Changed to nafcillin.  Improving mentation.   Will need prolonged treatment with nafcillin for 4 more weeks through September 29th.    3.  Osteomyelitis with abscess - Dr. [name] to evaluate for possible surgical intervention.  Antibiotics as above.  He may need some continuation of antibiotics after 9/29 depending on progress.    Will continue to follow

## 2019-10-26 NOTE — Progress Notes (Deleted)
Speech-language-Cognition Assessment    10/26/19 1300  SLP Visit Information  SLP Received On 10/26/19  SLP Time Calculation  SLP Start Time (ACUTE ONLY) 1213  SLP Stop Time (ACUTE ONLY) 1240  SLP Time Calculation (min) (ACUTE ONLY) 27 min  General Information  HPI 49 yo male with hx of HTN, T2DM, gout, IBS, fibromyalgia, obesity who presented on 8/16 for COVID pneumonia; decompensated on 8/19-8/28 (self extubated 8/23 and reintubated same day). Hospitalization was complicated by MSSA and GBS bacteria and found to have systolic heart failure. CXR 9/1 Hypoinflation of the lungs with mild bibasilar subsegmental atelectasis.  Prior Functional Status  Cognitive/Linguistic Baseline Information not available (remote stroke, suspect baseline cognitive difficulties )  Type of Home Other(Comment)   Lives With Family  Available Help at Discharge Family;Available PRN/intermittently  Education completed 10th grade  Vocation Unemployed  Oral Motor/Sensory Function  Overall Oral Motor/Sensory Function WFL  Cognition  Overall Cognitive Status Impaired/Different from baseline  Arousal/Alertness Awake/alert  Orientation Level Oriented to place;Oriented to person;Oriented to time;Disoriented to situation  Attention Sustained;Focused (suspect higher level attention difficulties)  Focused Attention Appears intact  Sustained Attention Appears intact  Memory Impaired  Memory Impairment Decreased recall of new information;Retrieval deficit  Awareness Impaired  Awareness Impairment Intellectual impairment;Emergent impairment;Anticipatory impairment  Problem Solving Impaired  Problem Solving Impairment Verbal complex;Functional complex  Executive Function Reasoning;Organizing;Decision Making;Self Correcting;Self Monitoring  Reasoning Impaired  Reasoning Impairment Functional complex;Verbal complex  Organizing Impaired  Organizing Impairment Verbal complex;Functional complex  Decision Making Impaired   Decision Making Impairment Verbal complex;Functional complex  Self Monitoring Impaired  Self Monitoring Impairment Verbal complex;Functional complex  Self Correcting Impaired  Self Correcting Impairment Verbal complex;Functional complex  Safety/Judgment Impaired  Auditory Comprehension  Overall Auditory Comprehension Appears within functional limits for tasks assessed  Visual Recognition/Discrimination  Discrimination Not tested  Reading Comprehension  Reading Status Not tested  Expression  Primary Mode of Expression Verbal  Verbal Expression  Overall Verbal Expression Appears within functional limits for tasks assessed  Initiation No impairment  Automatic Speech Name;Social Response;Day of week  Level of Generative/Spontaneous Verbalization Word;Phrase;Sentence;Conversation  Written Expression  Dominant Hand Right  Written Expression Unable to assess (comment) (RUE weakness)  Motor Speech  Overall Motor Speech Appears within functional limits for tasks assessed  Respiration WFL  Phonation Normal  Resonance [facility]  Articulation [facility]  Motor Planning [facility]  Motor Speech Errors NA  Assessment  Clinical Impression Statement (ACUTE ONLY) Pt was alert and OX3 during SLE. Pt has been unemployed and is responsible for finances and medical management. Lives with mom. He acheived a score of 10/26 on the SLUMS, revealing several areas of impairement. Pt was attentive to the eval and demonstrated adequate focused and sustained attention. He demonstrated memory deficits with retrieval and recall of new information. All areas of awareness were observed to be impaired. The pt demonstrated great difficulty with executive function and processing of higher-level information. Auditory comprehension and verbal expression observed to be WNL. Speech intelligibility WNL. Recommend SLP f/u to address cognitive impairments.   SLP Recommendation/Assessment Patient needs continued Speech Lanaguage Pathology  Services  SLP Visit Diagnosis Cognitive communication deficit [phone])  Problem List Executive Functioning;Reasoning;Problem Solving;Memory;Attention  Plan  Speech Therapy Frequency (ACUTE ONLY) min 2x/week  Duration 2 weeks  Treatment/Interventions Cueing hierarchy;Internal/external aids;SLP instruction and feedback;Compensatory strategies  Potential to Achieve Goals (ACUTE ONLY) Good  Potential Considerations (ACUTE ONLY) Co-morbidities;Previous level of function (Simultaneous filing. User may not have seen previous data.)  SLP Recommendations  Follow  up Recommendations None  SLP Evaluations  $ SLP Speech Visit 1 Visit  SLP Evaluations  $ SLP EVAL LANGUAGE/SOUND PRODUCTION 1 Procedure  Breck Coons Asani Mcburney M.Ed Nurse, children's [phone] Office [phone]

## 2019-10-26 NOTE — Progress Notes (Signed)
Brief Neuro Update:  Reviewed MRI Brain. Agree with radiology read. The location of the lesion in the primary motor cortex would fit in with his R leg weakness. The appearance of the lesion is more elongated and somewhat atypical for what I would expect from stroke. Strokes tend to be either wedge shaped or if they are lacunar are very tiny and round. This is a rather large oblong lesion. The lesion also has peripheral enhancement which I would expect from cerebritis. Subacute stroke would have a more patchy enhancement. There is no clear fluid collection suggestive of an abscess.  He has not had any abnormal movements concerning for seizure. No other areas of contrast enhancement on the MRI.  Recs: 1. He is on Cefazolin which DOES NOT have adequate penetration across the blood brain barrier. Would recommend discussing with ID for an alternative antibiotic.  2. I am going to stop Plavix since suspicion for stroke is low. 3. I forgot to order MR Arteriogram head, I will order it today.

## 2019-10-26 NOTE — Progress Notes (Signed)
VASCULAR LAB    Carotid duplex completed.    Preliminary report:  See CV proc for preliminary results.  Gurshaan Matsuoka, RVT 10/26/2019, 11:29 AM

## 2019-10-26 NOTE — Progress Notes (Signed)
PROGRESS NOTE                                                                                                                                                                                                             Patient Demographics:    Micheal Bell, is a 49 y.o. male, DOB - May 31, 1970, QGB:[id number]  Outpatient Primary MD for the patient is [name], MD    LOS - 14  Admit date - 10/09/2019    Chief Complaint  Patient presents with  . Cough       Brief Narrative - 49 yo male with hx of HTN, T2DM, gout, IBS, fibromyalgia, obesity who presented on 8/16 for COVID pneumonia.  He decompensated on 8/19 and was intubated.  Self extubated on 8/23 and was reintubated.  He's subsequently been extubated on 8/28.  He's been on typical therapies for covid including steroids, baricitinib and remdesivir.  His hospitalization was complicated by MSSA and GBS bacteria.  Also found to have systolic heart failure.  Transferred to myservice on 10/25/19   Subjective:   Patient in bed appears to be in no distress but somewhat confused, denies any headache chest or abdominal pain.  Overall unreliable historian.   Assessment  & Plan :     1. Acute Hypoxic Resp. Failure due to Acute Covid 19 Viral Pneumonitis during the ongoing 2020 Covid 19 Pandemic - he had severe hypoxia and parenchymal lung injury he was intubated and admitted by ICU, he was started on IV steroids, remdesivir and Baricitinib on 10/12/2019.  He was subsequently extubated on 10/21/2019.  His stay was complicated by bacteremia after which Baricitinib was discontinued on 10/24/2019.  His pulmonary status is gradually improving.  Encouraged the patient to sit up in chair in the daytime use I-S and flutter valve for pulmonary toiletry and then prone in bed when at night.  Will advance activity and titrate down oxygen as possible.   SpO2: 90 % O2 Flow Rate (L/min): 3  L/min FiO2 (%): 97 %  Recent Labs  Lab 10/21/19 0458 10/21/19 1517 10/22/19 0450 10/23/19 0352 10/23/19 1058 10/23/19 1119 10/23/19 1206 10/23/19 1659 10/24/19 0130 10/25/19 0352 10/25/19 0353 10/25/19 1600 10/26/19 0402  WBC   < >  --  14.3* 18.7*  --   --   --   --  18.1* 15.5*  --   --  14.3*  PLT   < >  --  319 523*  --   --   --   --  554* 506*  --   --  396  CRP  --   --   --   --   --   --   --   --  5.1* 3.7*  --   --  5.4*  BNP  --   --   --   --   --   --   --   --   --   --  34.4  --  38.0  DDIMER  --   --   --   --  5.19*  --   --   --  3.55* 5.12*  --   --  6.29*  PROCALCITON  --   --   --   --   --  <0.10  --   --  <0.10  --   --  <0.10 <0.10  AST  --   --   --   --   --   --   --   --  34 30  --   --  23  ALT  --   --   --   --   --   --   --   --  31 25  --   --  16  ALKPHOS  --   --   --   --   --   --   --   --  122 105  --   --  89  BILITOT  --   --   --   --   --   --   --   --  0.3 0.4  --   --  1.2  ALBUMIN  --   --   --   --   --   --   --   --  2.3* 2.5*  --   --  2.3*  INR  --   --   --   --   --   --   --   --   --   --   --  1.2  --   LATICACIDVEN  --  1.1  --   --   --   --  2.3* 1.1  --   --   --   --   --    < > = values in this interval not displayed.    2.  Sepsis with MSSA and Group B strep bacteremia.  ID following, was on IV Ancef but switch to nafcillin on 10/26/2019.  Clean TEE, history of Charcot joints, peripheral neuropathy and recent left foot soft tissue injury, MRI left foot shows an abscess, Ortho has been consulted for drainage, also has osteomyelitis, defer duration of treatment with antibiotics to ID.  3.  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  Does have right-sided weakness and also some left arm weakness also family with me that he has bilateral rotator cuff injury.  For now we will place him on statin for secondary prevention, he has anaphylactic allergy to aspirin related  products, PT OT and speech eval.  Monitor.  MRI of the brain shows changes suggestive of cerebritis, neurology and ID on board.  Continue nafcillin and monitor.  3.  Possible systolic heart failure as  shown by TTE however this could have been transient due to sepsis, on repeat TEE his EF is now normalized to 50 to 55%.  Supportive care.  Cardiology on board currently blood pressure soft and he is tachycardic, likely dehydrated due to poor oral intake, hold ARB, increase beta-blocker dose, hydrate with IV fluids and monitor.   4.  Cyanotic right lower extremity toes.  Likely due to hypoperfusion which is transient, arterial ultrasound stable.  5.  History of chronic pain.  On multiple narcotics, Flexeril and high-dose benzodiazepines.  Hold all due to metabolic encephalopathy which is severe.  6. HTN -stable on combination of ARB and beta-blocker.   7.  DM type II.  Currently on Lantus and sliding scale monitor.  Lab Results  Component Value Date   HGBA1C 11.3 (H) 10/26/2019   CBG (last 3)  Recent Labs    10/26/19 0330 10/26/19 0720 10/26/19 1130  GLUCAP 101* 132* 146*    Condition - Extremely Guarded  Family Communication  :  Sister and mother [phone] - 10/25/19  Code Status :  Full  Consults  :  PCCM, ID, Cards  Procedures  :    CT Head -  1. No acute intracranial abnormality. 2. Area of subcortical low-density involving the posterior left frontal lobe at the convexity, likely encephalomalacia and sequela of remote infarct. There are no prior exams for comparison to establish chronicity. MRI could be considered for further evaluation based on clinical concern. 3. Paranasal sinus fluid levels on opacification of the right greater than left mastoid air cells, possibly related to intubation.  R. Leg Arterial US -  No evidence of arterial occlusive disease. Normal waveforms, no stenosis  TTE -  1. Extremely poor acoustic windows. LVEF appears to be depressed with diffuse  hypokinesis, worse in the mid/distal inferior/inferoseptal walls. . Left ventricular ejection fraction, by estimation, is 30 to 35%. The left ventricle has moderately decreased function. The left ventricular internal cavity size was mildly dilated. Left ventricular diastolic parameters are indeterminate.  2. Right ventricular systolic function is moderately reduced. The right ventricular size is mildly enlarged.  3. The mitral valve is normal in structure. Trivial mitral valve regurgitation.  4. The aortic valve is normal in structure. Aortic valve regurgitation is not visualized  TEE - 1. Left ventricular ejection fraction, by estimation, is 50 to 55%. The left ventricle has low normal function. The left ventricle has no regional wall motion abnormalities.  2. Right ventricular systolic function is normal. The right ventricular size is normal.  3. No left atrial/left atrial appendage thrombus was detected.  4. The mitral valve is normal in structure. Trivial mitral valve regurgitation. No evidence of mitral stenosis.  5. The aortic valve is normal in structure. Aortic valve regurgitation is not visualized. No aortic stenosis is present.  6. The inferior vena cava is normal in size with greater than 50% respiratory variability, suggesting right atrial pressure of 3 mmHg.  CT - 1. No acute intracranial abnormality. 2. Area of subcortical low-density involving the posterior left frontal lobe at the convexity, likely encephalomalacia and sequela of remote infarct. There are no prior exams for comparison to establish chronicity. MRI could be considered for further evaluation based on clinical concern. 3. Paranasal sinus fluid levels on opacification of the right greater than left mastoid air cells, possibly related to intubation    PUD Prophylaxis : PPI  Disposition Plan  :    Status is: Inpatient  Remains inpatient appropriate because:IV treatments  appropriate due to intensity of illness or inability to  take PO   Dispo: The patient is from: Home              Anticipated d/c is to: Home              Anticipated d/c date is: 3 days              Patient currently is not medically stable to d/c.   DVT Prophylaxis  :  Lovenox    Lab Results  Component Value Date   PLT 396 10/26/2019    Diet :  Diet Order            Diet heart healthy/carb modified Room service appropriate? No; Fluid consistency: Thin  Diet effective now                  Inpatient Medications  Scheduled Meds: . (feeding supplement) PROSource Plus  30 mL Oral BID BM  . buPROPion  150 mg Oral Daily  . chlorhexidine gluconate (MEDLINE KIT)  15 mL Mouth Rinse BID  . Chlorhexidine Gluconate Cloth  6 each Topical Daily  . docusate sodium  100 mg Oral BID  . enoxaparin (LOVENOX) injection  60 mg Subcutaneous Daily  . feeding supplement (ENSURE ENLIVE)  237 mL Oral TID BM  . insulin aspart  0-15 Units Subcutaneous Q4H  . insulin glargine  20 Units Subcutaneous BID  . linagliptin  5 mg Oral Daily  . metoprolol tartrate  50 mg Oral BID  . pantoprazole  40 mg Oral Daily  . polyethylene glycol  17 g Oral Daily  . potassium chloride  40 mEq Oral Once  . predniSONE  10 mg Oral Q breakfast  . senna  2 tablet Oral BID  . sodium chloride flush  10-40 mL Intracatheter Q12H   Continuous Infusions: . sodium chloride Stopped (10/12/19 1606)  . lactated ringers    . magnesium sulfate bolus IVPB    . nafcillin IV 12 g (10/26/19 1434)   PRN Meds:.sodium chloride, bisacodyl, ipratropium-albuterol  Antibiotics  :    Anti-infectives (From admission, onward)   Start     Dose/Rate Route Frequency Ordered Stop   10/26/19 1415  nafcillin 12 g in dextrose 5 % 500 mL IVPB        12 g 20.8 mL/hr over 24 Hours Intravenous Every 24 hours 10/26/19 1414     10/26/19 1400  nafcillin injection 12 g  Status:  Discontinued        12 g Intravenous Daily 10/26/19 1238 10/26/19 1242   10/26/19 1400  nafcillin 12 g in dextrose 5 % 50 mL  IVPB  Status:  Discontinued        12 g 100 mL/hr over 30 Minutes Intravenous Every 24 hours 10/26/19 1242 10/26/19 1339   10/26/19 1400  nafcillin 12 g in dextrose 5 % 500 mL IVPB  Status:  Discontinued        12 g 20.8 mL/hr over 24 Hours Intravenous Every 24 hours 10/26/19 1337 10/26/19 1416   10/26/19 1200  nafcillin 2 g in sodium chloride 0.9 % 100 mL IVPB  Status:  Discontinued        2 g 200 mL/hr over 30 Minutes Intravenous Every 4 hours 10/26/19 1010 10/26/19 1238   10/12/19 2200  cefTRIAXone (ROCEPHIN) 2 g in sodium chloride 0.9 % 100 mL IVPB  Status:  Discontinued        2 g 200 mL/hr  over 30 Minutes Intravenous Every 24 hours 10/12/19 0441 10/12/19 0921   10/12/19 0930  ceFAZolin (ANCEF) IVPB 2g/100 mL premix  Status:  Discontinued        2 g 200 mL/hr over 30 Minutes Intravenous Every 8 hours 10/12/19 0921 10/26/19 1010   10/11/19 1000  remdesivir 100 mg in sodium chloride 0.9 % 100 mL IVPB       "Followed by" Linked Group Details   100 mg 200 mL/hr over 30 Minutes Intravenous Daily 10/10/19 0050 10/14/19 0956   10/11/19 0800  vancomycin (VANCOCIN) IVPB 1000 mg/200 mL premix  Status:  Discontinued        1,000 mg 200 mL/hr over 60 Minutes Intravenous Every 8 hours 10/10/19 2239 10/12/19 0921   10/10/19 2230  vancomycin (VANCOCIN) IVPB 1000 mg/200 mL premix        1,000 mg 200 mL/hr over 60 Minutes Intravenous Every 1 hr x 2 10/10/19 2208 10/11/19 0052   10/10/19 2215  cefTRIAXone (ROCEPHIN) 2 g in sodium chloride 0.9 % 100 mL IVPB  Status:  Discontinued        2 g 200 mL/hr over 30 Minutes Intravenous Every 24 hours 10/10/19 2205 10/12/19 0441   10/10/19 0100  remdesivir 100 mg in sodium chloride 0.9 % 100 mL IVPB       "Followed by" Linked Group Details   100 mg 200 mL/hr over 30 Minutes Intravenous Every 30 min 10/10/19 0050 10/10/19 0332       Time Spent in minutes  30   Lala Lund M.D on 10/26/2019 at 3:06 PM  To page go to [email or web address] - password  [facility]  [facility] -  Office  [phone]   See all Orders from today for further details    Objective:   Vitals:   10/26/19 0328 10/26/19 0732 10/26/19 1130 10/26/19 1215  BP: 119/67 123/73 119/85   Pulse: 84 87 89   Resp: 19 16 (!) 27 20  Temp: 98.4 F (36.9 C) 98.3 F (36.8 C) 98.7 F (37.1 C)   TempSrc: Bladder Axillary Oral   SpO2: 91% 90% 90%   Weight: 117.7 kg     Height:        Wt Readings from Last 3 Encounters:  10/26/19 117.7 kg  09/11/19 111.1 kg  05/12/17 (!) 145.2 kg     Intake/Output Summary (Last 24 hours) at 10/26/2019 1506 Last data filed at 10/26/2019 1300 Gross per 24 hour  Intake 620 ml  Output 700 ml  Net -80 ml     Physical Exam  Awake but confused, right-sided weakness, deltoid weakness in both extremities,  [place].AT,PERRAL Supple Neck,No JVD, No cervical lymphadenopathy appriciated.  Symmetrical Chest wall movement, Good air movement bilaterally, CTAB RRR,No Gallops, Rubs or new Murmurs, No Parasternal Heave +ve B.Sounds, Abd Soft, No tenderness, No organomegaly appriciated, No rebound - guarding or rigidity. No Cyanosis, slightly cyanotic right toes but there is chronic, bandages on both ankles   Data Review:    CBC Recent Labs  Lab 10/22/19 0450 10/23/19 0352 10/24/19 0130 10/25/19 0352 10/26/19 0402  WBC 14.3* 18.7* 18.1* 15.5* 14.3*  HGB 11.6* 13.0 12.3* 13.2 11.2*  HCT 36.7* 40.9 39.2 40.1 34.3*  PLT 319 523* 554* 506* 396  MCV 94.1 93.8 94.9 94.1 93.7  MCH 29.7 29.8 29.8 31.0 30.6  MCHC 31.6 31.8 31.4 32.9 32.7  RDW 11.6 11.9 12.2 12.4 12.3  LYMPHSABS  --   --  3.8 2.4 2.4  MONOABS  --   --  1.1* 0.8 0.8  EOSABS  --   --  0.1 0.1 0.1  BASOSABS  --   --  0.1 0.1 0.1    Recent Labs  Lab 10/21/19 0458 10/21/19 1517 10/22/19 0450 10/23/19 0352 10/23/19 1058 10/23/19 1119 10/23/19 1206 10/23/19 1659 10/24/19 0130 10/25/19 0352 10/25/19 0353 10/25/19 1600 10/26/19 0402  NA   < >  --  138 139  --   --    --   --  143 141  --   --  134*  K   < >  --  3.3* 3.9  --   --   --   --  3.5 3.3*  --   --  3.5  CL   < >  --  98 99  --   --   --   --  102 102  --   --  101  CO2   < >  --  30 28  --   --   --   --  29 27  --   --  24  GLUCOSE   < >  --  185* 217*  --   --   --   --  82 100*  --   --  111*  BUN   < >  --  19 21*  --   --   --   --  22* 17  --   --  12  CREATININE   < >  --  0.53* 0.66  --   --   --   --  0.76 0.69  --   --  0.75  CALCIUM   < >  --  8.1* 9.0  --   --   --   --  9.2 9.1  --   --  8.6*  AST  --   --   --   --   --   --   --   --  34 30  --   --  23  ALT  --   --   --   --   --   --   --   --  31 25  --   --  16  ALKPHOS  --   --   --   --   --   --   --   --  122 105  --   --  89  BILITOT  --   --   --   --   --   --   --   --  0.3 0.4  --   --  1.2  ALBUMIN  --   --   --   --   --   --   --   --  2.3* 2.5*  --   --  2.3*  MG  --   --   --   --   --   --   --   --  2.0 1.7  --   --  1.5*  CRP  --   --   --   --   --   --   --   --  5.1* 3.7*  --   --  5.4*  DDIMER  --   --   --   --  5.19*  --   --   --  3.55* 5.12*  --   --  6.29*  PROCALCITON  --   --   --   --   --  <  0.10  --   --  <0.10  --   --  <0.10 <0.10  LATICACIDVEN  --  1.1  --   --   --   --  2.3* 1.1  --   --   --   --   --   INR  --   --   --   --   --   --   --   --   --   --   --  1.2  --   TSH  --   --   --   --   --   --  1.422  --   --   --   --   --   --   HGBA1C  --   --   --   --   --   --   --   --   --   --   --   --  11.3*  AMMONIA  --   --   --   --   --   --  22  --   --   --   --   --   --   BNP  --   --   --   --   --   --   --   --   --   --  34.4  --  38.0   < > = values in this interval not displayed.    ------------------------------------------------------------------------------------------------------------------ Recent Labs    10/26/19 0402  CHOL 168  HDL 43  LDLCALC 99  TRIG 130  CHOLHDL 3.9    Lab Results  Component Value Date   HGBA1C 11.3 (H) 10/26/2019    ------------------------------------------------------------------------------------------------------------------ No results for input(s): TSH, T4TOTAL, T3FREE, THYROIDAB in the last 72 hours.  Invalid input(s): FREET3  Cardiac Enzymes No results for input(s): CKMB, TROPONINI, MYOGLOBIN in the last 168 hours.  Invalid input(s): CK ------------------------------------------------------------------------------------------------------------------    Component Value Date/Time   BNP 38.0 10/26/2019 0402    Micro Results Recent Results (from the past 240 hour(s))  MRSA PCR Screening     Status: None   Collection Time: 10/17/19  8:51 AM   Specimen: Nasal Mucosa; Nasopharyngeal  Result Value Ref Range Status   MRSA by PCR NEGATIVE NEGATIVE Final    Comment:        The GeneXpert MRSA Assay (FDA approved for NASAL specimens only), is one component of a comprehensive MRSA colonization surveillance program. It is not intended to diagnose MRSA infection nor to guide or monitor treatment for MRSA infections. Performed at [facility], [place] [address], [place], [place] [address]   Culture, blood (routine x 2)     Status: None (Preliminary result)   Collection Time: 10/23/19 10:58 AM   Specimen: BLOOD  Result Value Ref Range Status   Specimen Description BLOOD LEFT ANTECUBITAL  Final   Special Requests   Final    BOTTLES DRAWN AEROBIC ONLY Blood Culture adequate volume   Culture   Final    NO GROWTH 3 DAYS Performed at [facility], [place] [address], [place], [place] [address]    Report Status PENDING  Incomplete  Culture, blood (routine x 2)     Status: None (Preliminary result)   Collection Time: 10/23/19 11:00 AM   Specimen: BLOOD LEFT HAND  Result Value Ref Range Status   Specimen Description BLOOD LEFT HAND  Final   Special Requests  Final    BOTTLES DRAWN AEROBIC ONLY Blood Culture adequate volume   Culture   Final    NO GROWTH 3 DAYS Performed at [facility], [place] [address] [place], [place] [address]    Report Status PENDING  Incomplete    Radiology Reports CT HEAD WO CONTRAST  Result Date: 10/19/2019 CLINICAL DATA:  Deliriums.  Mental status change. EXAM: CT HEAD WITHOUT CONTRAST TECHNIQUE: Contiguous axial images were obtained from the base of the skull through the vertex without intravenous contrast. COMPARISON:  None. FINDINGS: Brain: Area subcortical low-density involving the posterior left frontal lobe at the convexity, series 3, image 25 and series 5, image 49, likely encephalomalacia and sequela of remote infarct, however nonspecific. No hemorrhage. No evidence of acute ischemia. No hydrocephalus, midline shift or mass effect. No subdural or extra-axial collection. Vascular: Atherosclerosis of skullbase vasculature without hyperdense vessel or abnormal calcification. Skull: No fracture or focal lesion. Sinuses/Orbits: Nasogastric tube in place. Fluid levels in the sphenoid sinuses and right maxillary sinus with scattered opacification of ethmoid air cells may be related to intubation. Opacification of the right greater than left mastoid air cells. Other: None. IMPRESSION: 1. No acute intracranial abnormality. 2. Area of subcortical low-density involving the posterior left frontal lobe at the convexity, likely encephalomalacia and sequela of remote infarct. There are no prior exams for comparison to establish chronicity. MRI could be considered for further evaluation based on clinical concern. 3. Paranasal sinus fluid levels on opacification of the right greater than left mastoid air cells, possibly related to intubation. Electronically Signed   By: Keith Rake M.D.   On: 10/19/2019 15:30   CT ANGIO CHEST PE W OR WO CONTRAST  Result Date: 10/23/2019 CLINICAL DATA:  COVID pneumonia, acute hypoxic respiratory failure, positive D-dimer EXAM: CT ANGIOGRAPHY CHEST WITH CONTRAST TECHNIQUE: Multidetector CT imaging of the chest was  performed using the standard protocol during bolus administration of intravenous contrast. Multiplanar CT image reconstructions and MIPs were obtained to evaluate the vascular anatomy. CONTRAST:  44m OMNIPAQUE IOHEXOL 350 MG/ML SOLN COMPARISON:  None. FINDINGS: Cardiovascular: Satisfactory opacification of the pulmonary arteries to the segmental level. No evidence of pulmonary embolism. The central pulmonary arteries are of normal caliber. There is moderate coronary artery calcification. Normal heart size. No pericardial effusion. The thoracic aorta is unremarkable save for bovine arch anatomy. Right upper extremity PICC line tip is seen within the superior vena cava. Mediastinum/Nodes: No enlarged mediastinal, hilar, or axillary lymph nodes. Thyroid gland, trachea, and esophagus demonstrate no significant findings. Nasoenteric feeding tube extends into the upper abdomen beyond the margin of the examination. Lungs/Pleura: Lung volumes are small. Evaluation is limited by motion artifact, however, multifocal pulmonary infiltrates are identified demonstrating a peripheral and basal predominance, likely infectious or inflammatory in the acute setting. No central obstructing lesion. No pneumothorax or pleural effusion. No superimposed interstitial edema. Upper Abdomen: No acute abnormality. Musculoskeletal: No chest wall abnormality. No acute or significant osseous findings. Review of the MIP images confirms the above findings. IMPRESSION: 1. No evidence of pulmonary embolism. 2. Multifocal pulmonary infiltrates are identified demonstrating a peripheral and basal predominance, likely infectious or inflammatory in the acute setting. 3. Moderate coronary artery calcification. Electronically Signed   By: AFidela SalisburyMD   On: 10/23/2019 16:25   MR BRAIN WO CONTRAST  Result Date: 10/25/2019 CLINICAL DATA:  Neuro deficit, acute, stroke suspected. Additional history provided: COVID positive. [place]  group B strep bacte[place]. EXAM: MRI HEAD WITHOUT CONTRAST TECHNIQUE: Multiplanar, multiecho pulse sequences of the brain and surrounding structures were obtained without intravenous contrast. COMPARISON:  Head CT 10/19/2019. FINDINGS: Brain: The examination is limited by poor signal on several sequences as well as motion degradation. Most notably, there is moderate motion degradation of the axial T2 weighted sequence, severe motion degradation of the axial SWI sequence and moderate motion degradation of the coronal T2 weighted sequence. Cerebral volume is normal for age. There is a 17 mm focus of restricted diffusion and corresponding T2/FLAIR hyperintensity within the subcortical left frontoparietal lobes (for instance as seen on series 2, image 39) (series 3, image 17). Additional mild scattered T2/FLAIR hyperintensity within the cerebral white matter and pons is nonspecific, but consistent with chronic small vessel ischemic disease. Severe motion degradation of the axial SWI sequence precludes adequate evaluation for intracranial blood products. No extra-axial fluid collection. No midline shift. Vascular: Expected proximal arterial flow voids. Skull and upper cervical spine: No focal marrow lesion is identified within described limitations. Sinuses/Orbits: Visualized orbits show no acute finding. Air-fluid levels within the sphenoid and right maxillary sinuses. Partial opacification of right ethmoid air cells. Bilateral mastoid effusions. These results will be called to the ordering clinician or representative by the Radiologist Assistant, and communication documented in the PACS or [employer]. IMPRESSION: Examination limited by poor signal on several sequences as well as motion degradation, as described. 17 mm focus of restricted diffusion and T2/FLAIR hyperintensity within the subcortical left frontoparietal lobes. This finding is nonspecific, but given  the patient's history, the primary differential considerations are acute/early subacute infarct versus cerebritis. Consider contrast-enhanced MR imaging of the brain for further evaluation of this lesion. Mild chronic small vessel ischemic disease. Paranasal sinus disease with air-fluid levels. Bilateral mastoid effusions. Electronically Signed   By: Kellie Simmering DO   On: 10/25/2019 15:16   MR BRAIN W CONTRAST  Result Date: 10/25/2019 CLINICAL DATA:  Brain mass follow-up EXAM: MRI HEAD WITH CONTRAST TECHNIQUE: Multiplanar, multiecho pulse sequences of the brain and surrounding structures were obtained with intravenous contrast. CONTRAST:  25m GADAVIST GADOBUTROL 1 MMOL/ML IV SOLN COMPARISON:  Brain MRI without contrast 10/25/2019 FINDINGS: Brain: There is a peripherally enhancing lesion at the base of the left precentral gyrus that measures 0.9 x 0.7 x 1.8 cm. There are no other areas of abnormal contrast enhancement. 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 Electronically Signed   By: KUlyses JarredM.D.   On: 10/25/2019 22:47   MR FOOT LEFT WO CONTRAST  Result Date: 10/25/2019 CLINICAL DATA:  Bacteremia left foot pain and diabetic EXAM: MRI OF THE LEFT FOOT WITHOUT CONTRAST TECHNIQUE: Multiplanar, multisequence MR imaging of the left was performed. No intravenous contrast was administered. COMPARISON:  October 23, 2019 FINDINGS: Bones/Joint/Cartilage Mildly angulated nondisplaced fractures of the midshaft of the fourth and fifth metatarsals are noted. There is periosteal reaction seen along the medial margins of the metatarsal shafts. Increased heterogeneous T2 hyperintense signal with subtle T1 hypointensity seen at the base of the fourth and fifth metatarsals. No other osseous marrow signal abnormality is seen. There is no large knee joint effusions noted. The articular surfaces appear to be maintained.  Ligaments The Lisfranc ligament is intact. Muscles and Tendons Increased feathery signal with atrophy is seen throughout the muscles of the forefoot. There is a small amount of fluid seen surrounding the posterior tibialis tendon. The  remainder of the flexor and extensor tendons are intact. Soft tissues Lateral plantar surface of the forefoot overlying the fifth metatarsal base there is a focal area of ulceration measuring approximately 8 mm in transverse dimension. A fluid-filled sinus tract is seen extending to the overlying osseous surface. There is a multilocular fluid collection which extends around the dorsal surface of the fifth metatarsal measuring approximately 5.4 x 0.9 x 3.4 cm. There is extensive dorsal and lateral subcutaneous edema and skin thickening. IMPRESSION: Superficial area of ulceration over the plantar lateral aspect of the fifth metatarsal base with a fluid-filled sinus tract and loculated probable abscess extending over the dorsal surface of the fifth metatarsal measuring 5.4 x 0.9 by is a 3.4 cm. Incomplete mildly angulated fourth and fifth metatarsal shaft fractures with periosteal reaction Findings which could be suggestive of reactive marrow versus early osteomyelitis involving the base of the fourth and fifth metatarsals. Electronically Signed   By: Prudencio Pair M.D.   On: 10/25/2019 19:06   DG Chest Port 1 View  Result Date: 10/25/2019 CLINICAL DATA:  Shortness of breath. EXAM: PORTABLE CHEST 1 VIEW COMPARISON:  October 16, 2019. FINDINGS: The heart size and mediastinal contours are within normal limits. Hypoinflation of the lungs is noted with mild bibasilar subsegmental atelectasis. Endotracheal tube has been removed. Right-sided PICC line is unchanged in position. The visualized skeletal structures are unremarkable. IMPRESSION: Hypoinflation of the lungs with mild bibasilar subsegmental atelectasis. Electronically Signed   By: Marijo Conception M.D.   On: 10/25/2019 08:37   DG  CHEST PORT 1 VIEW  Result Date: 10/16/2019 CLINICAL DATA:  Status post intubation. EXAM: PORTABLE CHEST 1 VIEW COMPARISON:  10/14/2019 FINDINGS: ET tube tip is above the carina. There is a right arm PICC line with tip at the cavoatrial junction. Lung volumes are low. Similar appearance of bilateral pulmonary opacities compatible with multifocal pneumonia. IMPRESSION: 1. Stable support apparatus. 2. Persistent bilateral pulmonary opacities compatible with multifocal pneumonia. Electronically Signed   By: Kerby Moors M.D.   On: 10/16/2019 06:14   DG Chest Port 1 View  Result Date: 10/15/2019 CLINICAL DATA:  49 year old male with history of COVID infection. EXAM: PORTABLE CHEST 1 VIEW COMPARISON:  Chest x-ray 10/14/2019. FINDINGS: An endotracheal tube is in place with tip 3.6 cm above the carina. A nasogastric tube is seen extending into the stomach, however, the tip of the nasogastric tube extends below the lower margin of the image. There is a right upper extremity PICC with tip terminating in the right atrium. Lung volumes are low. Widespread patchy areas of interstitial prominence an ill-defined airspace disease again noted throughout the lungs bilaterally. Overall, aeration is essentially unchanged. No pleural effusions. No evidence of pulmonary edema. No pneumothorax. Heart size is normal. Upper mediastinal contours are within normal limits. IMPRESSION: 1. Support apparatus, as above. 2. The appearance the chest is compatible with multilobar pneumonia from reported COVID infection. Electronically Signed   By: Vinnie Langton M.D.   On: 10/15/2019 05:39   DG Chest Port 1 View  Result Date: 10/14/2019 CLINICAL DATA:  COVID-19, ARDS EXAM: PORTABLE CHEST 1 VIEW COMPARISON:  10/13/2019 FINDINGS: Single frontal view of the chest demonstrates stable position of the endotracheal tube and enteric catheter. There is a right-sided PICC tip overlying superior vena cava. The cardiac silhouette is stable.  Interstitial and ground-glass opacities are seen throughout the lungs bilaterally, unchanged. Lung volumes are diminished. No effusion or pneumothorax. IMPRESSION: 1. 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 2. Stable support devices. Electronically Signed   By: Randa Ngo M.D.   On: 10/14/2019 15:57   DG Chest Port 1 View  Result Date: 10/13/2019 CLINICAL DATA:  Acute respiratory failure with hypoxemia, COVID positive, asthma, diabetes mellitus, hypertension, CHF EXAM: PORTABLE CHEST 1 VIEW COMPARISON:  Portable exam 1021 hours compared to 10/12/2019 FINDINGS: Tip of endotracheal tube projects 3.7 cm above carina. Nasogastric tube extends into stomach. RIGHT arm PICC line tip projects over cavoatrial junction. Stable heart size mediastinal contours for technique. Patchy BILATERAL airspace infiltrates consistent with multifocal pneumonia, minimally improved. No pleural effusion or pneumothorax. IMPRESSION: Minimally improved pulmonary infiltrates. Electronically Signed   By: Lavonia Dana M.D.   On: 10/13/2019 10:37   DG CHEST PORT 1 VIEW  Result Date: 10/12/2019 CLINICAL DATA:  Intubation EXAM: PORTABLE CHEST 1 VIEW COMPARISON:  Earlier today FINDINGS: New endotracheal tube with tip at the clavicular heads, measuring 3 cm above the carina. Low volume chest with severe airspace disease. No visible effusion or air leak. Stable heart size which is distorted by positioning and volumes. IMPRESSION: 1. Unremarkable positioning of the endotracheal tube. 2. Stable extensive airspace disease with low lung volumes. Electronically Signed   By: Monte Fantasia M.D.   On: 10/12/2019 07:13   DG CHEST PORT 1 VIEW  Result Date: 10/12/2019 CLINICAL DATA:  Shortness of breath EXAM: PORTABLE CHEST 1 VIEW COMPARISON:  Three days ago FINDINGS: Very low volume chest with diffuse and patchy pulmonary opacity. Vascular pedicle  widening and prominent heart size largely related to technique. No visible effusion or pneumothorax. IMPRESSION: Worsening multifocal pneumonia.  Lung volumes remain very low. Electronically Signed   By: Monte Fantasia M.D.   On: 10/12/2019 04:07   DG Chest Portable 1 View  Result Date: 10/09/2019 CLINICAL DATA:  Cough, fever, shortness of breath EXAM: PORTABLE CHEST 1 VIEW COMPARISON:  None. FINDINGS: Single frontal view of the chest demonstrates unremarkable cardiac silhouette. The lung volumes are diminished, with patchy bilateral areas of faint ground-glass airspace disease consistent with multifocal pneumonia. No effusion or pneumothorax. IMPRESSION: 1. Patchy bilateral ground-glass airspace disease consistent with multifocal pneumonia. Electronically Signed   By: Randa Ngo M.D.   On: 10/09/2019 19:32   DG Abd Portable 1V  Result Date: 10/20/2019 CLINICAL DATA:  GI problem. EXAM: PORTABLE ABDOMEN - 1 VIEW COMPARISON:  10/16/2019 FINDINGS: Feeding tube tip is in the distal stomach or proximal duodenum. Mild gaseous distention of the stomach. Gas throughout nondistended large and small bowel. No evidence of bowel obstruction, organomegaly or free air. IMPRESSION: Feeding tube tip in the distal stomach or proximal duodenum. No evidence of obstruction or free air. Electronically Signed   By: Rolm Baptise M.D.   On: 10/20/2019 21:28   DG Abd Portable 1V  Result Date: 10/16/2019 CLINICAL DATA:  Status post OG tube placement EXAM: PORTABLE ABDOMEN - 1 VIEW COMPARISON:  10/16/2019 FINDINGS: The enteric tube tip projects over the distal body of stomach in the side port is below the level of the GE junction. A few air-filled loops of small bowel are noted within the upper abdomen which measure up to 2.8 cm. IMPRESSION: Satisfactory position of enteric tube. Electronically Signed   By: Kerby Moors M.D.   On: 10/16/2019 06:52   DG Abd Portable 1V  Result Date: 10/12/2019 CLINICAL DATA:   Onychogryphosis EXAM: PORTABLE ABDOMEN - 1 VIEW COMPARISON:  Portable exam 0830 hours without  priors for comparison FINDINGS: Tip of nasogastric tube projects over distal antrum. Nonobstructive bowel gas pattern. No bowel dilatation or bowel wall thickening. Patchy bibasilar infiltrates. No acute osseous findings. IMPRESSION: Tip of nasogastric tube projects over distal gastric antrum. Nonobstructive bowel gas pattern. Bibasilar pulmonary infiltrates. Electronically Signed   By: Lavonia Dana M.D.   On: 10/12/2019 08:51   DG Foot 2 Views Left  Result Date: 10/23/2019 CLINICAL DATA:  Bacteremia, LEFT foot pain and diabetic. EXAM: LEFT FOOT - 2 VIEW COMPARISON:  10/13/2019 and prior radiographs FINDINGS: Mildly angulated fractures of the 4th and 5th metatarsals are again noted with fracture lines still evident. Healing changes along these fractures are noted. Overlying soft tissue swelling is noted. No new fracture, subluxation or dislocation noted. No definite radiographic evidence of acute osteomyelitis noted. IMPRESSION: Mildly angulated healing fractures of the 4th and 5th metatarsals with soft tissue swelling again noted but without definite radiographic evidence of acute osteomyelitis. Consider MRI for further evaluation as clinically indicated. Electronically Signed   By: Margarette Canada M.D.   On: 10/23/2019 13:40   DG Foot 2 Views Left  Result Date: 10/13/2019 CLINICAL DATA:  49 year old male with diabetic foot. EXAM: LEFT FOOT - 2 VIEW COMPARISON:  Left foot radiograph dated 09/11/2019. FINDINGS: Evaluation is limited due to positioning. Fractures of the midportion of the fourth and fifth metacarpal with interval progression of bridging callus formation. No new fracture identified. There is no dislocation. There is diffuse soft tissue swelling of the foot. No radiopaque foreign object or soft tissue gas. IMPRESSION: 1. Healing fractures of the fourth and fifth metacarpal. 2. Diffuse soft tissue swelling.  No radiopaque foreign object or soft tissue gas. Electronically Signed   By: Anner Crete M.D.   On: 10/13/2019 16:49   ECHOCARDIOGRAM COMPLETE  Result Date: 10/12/2019    ECHOCARDIOGRAM REPORT   Patient Name:   GEVON MARKUS Date of Exam: 10/12/2019 Medical Rec #:  [id number]      Height:       74.0 in Accession #:    [id number]     Weight:       245.0 lb Date of Birth:  17-Dec-1970      BSA:          2.369 m Patient Age:    34 years       BP:           111/74 mmHg Patient Gender: M              HR:           75 bpm. Exam Location:  Inpatient Procedure: 2D Echo Indications:    CHF 428                  & Bacteremia 790.7  History:        Patient has no prior history of Echocardiogram examinations.                 CHF; Risk Factors:Hypertension and Diabetes. Covid +.  Sonographer:    Jannett Celestine RDCS (AE) Referring Phys: [id number] Silvestre Moment [facility]  Sonographer Comments: Echo performed with patient supine and on artificial respirator. Image acquisition challenging due to respiratory motion and Image acquisition challenging due to patient body habitus. restricted mobility IMPRESSIONS  1. Extremely poor acoustic windows. LVEF appears to be depressed with diffuse hypokinesis, worse in the mid/distal inferior/inferoseptal walls. . Left ventricular ejection fraction, by estimation, is 30 to 35%. The left ventricle has  moderately decreased function. The left ventricular internal cavity size was mildly dilated. Left ventricular diastolic parameters are indeterminate.  2. Right ventricular systolic function is moderately reduced. The right ventricular size is mildly enlarged.  3. The mitral valve is normal in structure. Trivial mitral valve regurgitation.  4. The aortic valve is normal in structure. Aortic valve regurgitation is not visualized. FINDINGS  Left Ventricle: Extremely poor acoustic windows. LVEF appears to be depressed with diffuse hypokinesis, worse in the mid/distal inferior/inferoseptal walls. Left ventricular  ejection fraction, by estimation, is 30 to 35%. The left ventricle has moderately decreased function. The left ventricle demonstrates regional wall motion abnormalities. The left ventricular internal cavity size was mildly dilated. There is no left ventricular hypertrophy. Left ventricular diastolic parameters are indeterminate. Right Ventricle: The right ventricular size is mildly enlarged. Right vetricular wall thickness was not assessed. Right ventricular systolic function is moderately reduced. Left Atrium: Left atrial size was normal in size. Right Atrium: Right atrial size was normal in size. Pericardium: There is no evidence of pericardial effusion. Mitral Valve: The mitral valve is normal in structure. Trivial mitral valve regurgitation. Tricuspid Valve: The tricuspid valve is normal in structure. Tricuspid valve regurgitation is trivial. Aortic Valve: The aortic valve is normal in structure. Aortic valve regurgitation is not visualized. Pulmonic Valve: The pulmonic valve was grossly normal. Pulmonic valve regurgitation is trivial. Aorta: The aortic root is normal in size and structure. IAS/Shunts: The interatrial septum was not assessed.  LEFT VENTRICLE PLAX 2D LVIDd:         5.60 cm  Diastology LVIDs:         3.90 cm  LV e' lateral:   8.81 cm/s LV PW:         1.40 cm  LV E/e' lateral: 5.7 LV IVS:        1.10 cm  LV e' medial:    5.87 cm/s LVOT diam:     2.40 cm  LV E/e' medial:  8.5 LV SV:         64 LV SV Index:   27 LVOT Area:     4.52 cm  RIGHT VENTRICLE RV S prime:     9.79 cm/s TAPSE (M-mode): 1.3 cm LEFT ATRIUM             Index       RIGHT ATRIUM           Index LA diam:        3.10 cm 1.31 cm/m  RA Area:     18.80 cm LA Vol (A2C):   62.9 ml 26.55 ml/m RA Volume:   51.80 ml  21.86 ml/m LA Vol (A4C):   40.8 ml 17.22 ml/m LA Biplane Vol: 50.8 ml 21.44 ml/m  AORTIC VALVE LVOT Vmax:   69.80 cm/s LVOT Vmean:  49.400 cm/s LVOT VTI:    0.142 m  AORTA Ao Root diam: 3.60 cm MITRAL VALVE MV Area (PHT):  2.34 cm    SHUNTS MV Decel Time: 324 msec    Systemic VTI:  0.14 m MV E velocity: 50.10 cm/s  Systemic Diam: 2.40 cm MV A velocity: 71.60 cm/s MV E/A ratio:  0.70 Dorris Carnes MD Electronically signed by Dorris Carnes MD Signature Date/Time: 10/12/2019/1:58:41 PM    Final    ECHO TEE  Result Date: 10/24/2019    TRANSESOPHOGEAL ECHO REPORT   Patient Name:   Arie Sabina Date of Exam: 10/24/2019 Medical Rec #:  [id number]      Height:  74.0 in Accession #:    [id number]     Weight:       247.4 lb Date of Birth:  [birth year]      BSA:          2.379 m Patient Age:    87 years       BP:           120/80 mmHg Patient Gender: M              HR:           100 bpm. Exam Location:  Inpatient Procedure: Transesophageal Echo Indications:    Emobli/Bacteremia  History:        Patient has prior history of Echocardiogram examinations.  Sonographer:    Clayton Lefort RDCS (AE) Referring Phys: [phone] HAO MENG PROCEDURE: The transesophogeal probe was passed without difficulty through the esophogus of the patient. Sedation performed by different physician. The patient's vital signs; including heart rate, blood pressure, and oxygen saturation; remained stable throughout the procedure. The patient developed no complications during the procedure. IMPRESSIONS  1. Left ventricular ejection fraction, by estimation, is 50 to 55%. The left ventricle has low normal function. The left ventricle has no regional wall motion abnormalities.  2. Right ventricular systolic function is normal. The right ventricular size is normal.  3. No left atrial/left atrial appendage thrombus was detected.  4. The mitral valve is normal in structure. Trivial mitral valve regurgitation. No evidence of mitral stenosis.  5. The aortic valve is normal in structure. Aortic valve regurgitation is not visualized. No aortic stenosis is present.  6. The inferior vena cava is normal in size with greater than 50% respiratory variability, suggesting right atrial pressure of  3 mmHg. Conclusion(s)/Recommendation(s): Normal biventricular function without evidence of hemodynamically significant valvular heart disease. FINDINGS  Left Ventricle: Left ventricular ejection fraction, by estimation, is 50 to 55%. The left ventricle has low normal function. The left ventricle has no regional wall motion abnormalities. The left ventricular internal cavity size was normal in size. There is no left ventricular hypertrophy. Right Ventricle: The right ventricular size is normal. No increase in right ventricular wall thickness. Right ventricular systolic function is normal. Left Atrium: Left atrial size was normal in size. No left atrial/left atrial appendage thrombus was detected. Right Atrium: Right atrial size was normal in size. Pericardium: There is no evidence of pericardial effusion. Mitral Valve: The mitral valve is normal in structure. Normal mobility of the mitral valve leaflets. Trivial mitral valve regurgitation. No evidence of mitral valve stenosis. There is no evidence of mitral valve vegetation. Tricuspid Valve: The tricuspid valve is normal in structure. Tricuspid valve regurgitation is mild . No evidence of tricuspid stenosis. There is no evidence of tricuspid valve vegetation. Aortic Valve: The aortic valve is normal in structure. Aortic valve regurgitation is not visualized. No aortic stenosis is present. There is no evidence of aortic valve vegetation. Pulmonic Valve: The pulmonic valve was normal in structure. Pulmonic valve regurgitation is trivial. No evidence of pulmonic stenosis. Aorta: The aortic root is normal in size and structure. Venous: The inferior vena cava is normal in size with greater than 50% respiratory variability, suggesting right atrial pressure of 3 mmHg. IAS/Shunts: No atrial level shunt detected by color flow Doppler. Jenkins Rouge MD Electronically signed by Jenkins Rouge MD Signature Date/Time: 10/24/2019/3:28:45 PM    Final    VAS US CAROTID (at [facility] and [place]  only)  Result Date: 10/26/2019 Carotid Arterial Duplex Study  Indications:       10m focus of diffusion restriction in the left                    frontoparietal lobe concerning for acute/early subacute                    infarct vs cerebritis. Risk Factors:      Hypertension, Diabetes. Other Factors:     Covid-19, bacteremia. Comparison Study:  No prior study on file Performing Technologist: CSharion DoveRVS  Examination Guidelines: A complete evaluation includes B-mode imaging, spectral Doppler, color Doppler, and power Doppler as needed of all accessible portions of each vessel. Bilateral testing is considered an integral part of a complete examination. Limited examinations for reoccurring indications may be performed as noted.  Right Carotid Findings: +----------+--------+--------+--------+------------------+--------+           PSV cm/sEDV cm/sStenosisPlaque DescriptionComments +----------+--------+--------+--------+------------------+--------+ CCA Prox  66      11                                         +----------+--------+--------+--------+------------------+--------+ CCA Distal71      18                                         +----------+--------+--------+--------+------------------+--------+ ICA Prox  58      19                                         +----------+--------+--------+--------+------------------+--------+ ICA Distal90      33                                         +----------+--------+--------+--------+------------------+--------+ ECA       114     16                                         +----------+--------+--------+--------+------------------+--------+ +----------+--------+-------+--------+-------------------+           PSV cm/sEDV cmsDescribeArm Pressure (mmHG) +----------+--------+-------+--------+-------------------+ [id number]                                       +----------+--------+-------+--------+-------------------+  +---------+--------+--+--------+--+ VertebralPSV cm/s38EDV cm/s11 +---------+--------+--+--------+--+  Left Carotid Findings: +----------+--------+--------+--------+------------------+--------+           PSV cm/sEDV cm/sStenosisPlaque DescriptionComments +----------+--------+--------+--------+------------------+--------+ CCA Prox  55      14                                         +----------+--------+--------+--------+------------------+--------+ CCA Distal65      20                                         +----------+--------+--------+--------+------------------+--------+ ICA Prox  68  20                                         +----------+--------+--------+--------+------------------+--------+ ICA Distal76      23                                         +----------+--------+--------+--------+------------------+--------+ ECA       77      3                                          +----------+--------+--------+--------+------------------+--------+ +----------+--------+--------+--------+-------------------+           PSV cm/sEDV cm/sDescribeArm Pressure (mmHG) +----------+--------+--------+--------+-------------------+ [id number]                                          +----------+--------+--------+--------+-------------------+ +---------+--------+--+--------+--+ VertebralPSV cm/s63EDV cm/s18 +---------+--------+--+--------+--+   Summary: Right Carotid: The extracranial vessels were near-normal with only minimal wall                thickening or plaque. Left Carotid: The extracranial vessels were near-normal with only minimal wall               thickening or plaque. Vertebrals:  Bilateral vertebral arteries demonstrate antegrade flow. Subclavians: Normal flow hemodynamics were seen in bilateral subclavian              arteries. *See table(s) above for measurements and observations.  Electronically signed by Antony Contras MD on 10/26/2019 at  12:37:48 PM.    Final    VAS [place] LOWER EXTREMITY ARTERIAL DUPLEX  Result Date: 10/26/2019 LOWER EXTREMITY ARTERIAL DUPLEX STUDY Indications: Ulceration, and Osteomyelitis. High Risk Factors: Hypertension, Diabetes. Other Factors: Covid-19. Charcot foot.  Current ABI: N/A Comparison Study: No prior study Performing Technologist: Sharion Dove RVS  Examination Guidelines: A complete evaluation includes B-mode imaging, spectral Doppler, color Doppler, and power Doppler as needed of all accessible portions of each vessel. Bilateral testing is considered an integral part of a complete examination. Limited examinations for reoccurring indications may be performed as noted.  +----------+--------+-----+--------+---------+--------+ LEFT      PSV cm/sRatioStenosisWaveform Comments +----------+--------+-----+--------+---------+--------+ CFA Prox  123                  triphasic         +----------+--------+-----+--------+---------+--------+ DFA       51                   triphasic         +----------+--------+-----+--------+---------+--------+ SFA Prox  97                   triphasic         +----------+--------+-----+--------+---------+--------+ SFA Mid   88                   triphasic         +----------+--------+-----+--------+---------+--------+ SFA Distal81                   triphasic         +----------+--------+-----+--------+---------+--------+ POP Prox  60  triphasic         +----------+--------+-----+--------+---------+--------+ POP Distal86                   triphasic         +----------+--------+-----+--------+---------+--------+ ATA Distal110                  triphasic         +----------+--------+-----+--------+---------+--------+ PTA Prox  34                   triphasic         +----------+--------+-----+--------+---------+--------+ PTA Mid   29                   triphasic          +----------+--------+-----+--------+---------+--------+ PTA [id number]                  triphasic         +----------+--------+-----+--------+---------+--------+  Summary: Left: Normal waveforms and adequate flow noted.  See table(s) above for measurements and observations.    Preliminary    VAS [place] LOWER EXTREMITY ARTERIAL DUPLEX  Result Date: 10/19/2019 LOWER EXTREMITY ARTERIAL DUPLEX STUDY Indications: Cold leg, Covid-19.  Current ABI: N/A Comparison Study: No prior study Performing Technologist: Sharion Dove RVS  Examination Guidelines: A complete evaluation includes B-mode imaging, spectral Doppler, color Doppler, and power Doppler as needed of all accessible portions of each vessel. Bilateral testing is considered an integral part of a complete examination. Limited examinations for reoccurring indications may be performed as noted.  +-----------+--------+-----+--------+---------+--------+ RIGHT      PSV cm/sRatioStenosisWaveform Comments +-----------+--------+-----+--------+---------+--------+ CFA Prox   67                   triphasic         +-----------+--------+-----+--------+---------+--------+ DFA        60                   triphasic         +-----------+--------+-----+--------+---------+--------+ SFA Prox   68                   triphasic         +-----------+--------+-----+--------+---------+--------+ SFA Mid    62                   triphasic         +-----------+--------+-----+--------+---------+--------+ SFA Distal 69                   triphasic         +-----------+--------+-----+--------+---------+--------+ POP Prox   47                   triphasic         +-----------+--------+-----+--------+---------+--------+ POP Distal 42                   triphasic         +-----------+--------+-----+--------+---------+--------+ ATA Distal 15                   triphasic         +-----------+--------+-----+--------+---------+--------+ PTA  Prox   31                   triphasic         +-----------+--------+-----+--------+---------+--------+ PTA Mid    38  triphasic         +-----------+--------+-----+--------+---------+--------+ PTA Distal 38                   triphasic         +-----------+--------+-----+--------+---------+--------+ PERO Distal36                   triphasic         +-----------+--------+-----+--------+---------+--------+  Summary: Right: Normal examination. No evidence of arterial occlusive disease. Normal waveforms, no stenosis.  See table(s) above for measurements and observations. Electronically signed by Monica Martinez MD on 10/19/2019 at 3:25:02 PM.    Final    VAS [place] LOWER EXTREMITY VENOUS (DVT)  Result Date: 10/26/2019  Lower Venous DVTStudy Indications: Covid-19, elevated D-Dimer.  Comparison Study: Prior negative left lower extremity venous duplex don 03/19/17                   and is available for comparison. Performing Technologist: Sharion Dove RVS  Examination Guidelines: A complete evaluation includes B-mode imaging, spectral Doppler, color Doppler, and power Doppler as needed of all accessible portions of each vessel. Bilateral testing is considered an integral part of a complete examination. Limited examinations for reoccurring indications may be performed as noted. The reflux portion of the exam is performed with the patient in reverse Trendelenburg.  +---------+---------------+---------+-----------+----------+--------------+ RIGHT    CompressibilityPhasicitySpontaneityPropertiesThrombus Aging +---------+---------------+---------+-----------+----------+--------------+ CFV      Full           Yes      Yes                                 +---------+---------------+---------+-----------+----------+--------------+ SFJ      Full                                                         +---------+---------------+---------+-----------+----------+--------------+ FV Prox  Full                                                        +---------+---------------+---------+-----------+----------+--------------+ FV Mid   Full                                                        +---------+---------------+---------+-----------+----------+--------------+ FV DistalFull                                                        +---------+---------------+---------+-----------+----------+--------------+ PFV      Full                                                        +---------+---------------+---------+-----------+----------+--------------+  POP      Partial        Yes      Yes                  Acute          +---------+---------------+---------+-----------+----------+--------------+ PTV      Full                                                        +---------+---------------+---------+-----------+----------+--------------+ PERO     Full                                                        +---------+---------------+---------+-----------+----------+--------------+   +---------+---------------+---------+-----------+----------+--------------+ LEFT     CompressibilityPhasicitySpontaneityPropertiesThrombus Aging +---------+---------------+---------+-----------+----------+--------------+ CFV      Full           Yes      Yes                                 +---------+---------------+---------+-----------+----------+--------------+ SFJ      Full                                                        +---------+---------------+---------+-----------+----------+--------------+ FV Prox  Full                                                        +---------+---------------+---------+-----------+----------+--------------+ FV Mid   Full                                                         +---------+---------------+---------+-----------+----------+--------------+ FV DistalFull                                                        +---------+---------------+---------+-----------+----------+--------------+ PFV      Full                                                        +---------+---------------+---------+-----------+----------+--------------+ POP      Full           Yes      Yes                                 +---------+---------------+---------+-----------+----------+--------------+  PTV      Full                                                        +---------+---------------+---------+-----------+----------+--------------+ PERO     Full                                                        +---------+---------------+---------+-----------+----------+--------------+     Summary: RIGHT: - Findings consistent with acute deep vein thrombosis involving the right popliteal vein. - Ultrasound characteristics of enlarged lymph nodes are noted in the groin.  LEFT: - There is no evidence of deep vein thrombosis in the lower extremity.  *See table(s) above for measurements and observations.    Preliminary    [place] EKG SITE RITE  Result Date: 10/12/2019 If Site Rite image not attached, placement could not be confirmed due to current cardiac rhythm.

## 2019-10-26 NOTE — Progress Notes (Signed)
Physical Therapy Treatment Patient Details Name: Micheal Bell MRN: [id number] DOB: 02-Feb-1971 Today's Date: 10/26/2019    History of Present Illness Pt is a 49 y.o. male admitted 10/09/19 for COVID PNA. Pt decompensated 8/19 and intubated; self extubated 8/23 and reintubated; extubated 8/28. Course complicated by MSSA and GBS bacteria, sepsis, systolic HF, acute metabolic encephalopathy. Head CT with subcortical low density of posterior L frontal lobe, likely encephalomalacia and sequela of remote infarct; MRI with acute/subacute L frontoparietal infarct. TEE without evidence of vegitation. L foot diabetic wound; MRI pending. PMH includes fibromyalgia, DM, CHF, asthma, HTN.    PT Comments    Pt agreeable to participation.  Slow to progress toward goals.  Emphasis on rolling, transition to sit via sidelying, sit to stand in the STEDY   Follow Up Recommendations  CIR;Supervision/Assistance - 24 hour     Equipment Recommendations  Other (comment) (TBA)    Recommendations for Other Services       Precautions / Restrictions Precautions Precautions: Fall Precaution Comments: R heel wound    Mobility  Bed Mobility Overal bed mobility: Needs Assistance Bed Mobility: Rolling;Sidelying to Sit;Sit to Supine Rolling: Mod assist Sidelying to sit: Max assist;+2 for physical assistance   Sit to supine: Max assist;+2 for physical assistance   General bed mobility comments: cues for direction/sequencing, assist for LE's and trunk in rolling and transitions up and down to supine.  Transfers Overall transfer level: Needs assistance   Transfers: Sit to/from Stand Sit to Stand: Max assist;+2 physical assistance;From elevated surface         General transfer comment: cues for hand placement, technique, esp coming forward.  Assist for coming forward, building some momentum and boost with hip extension support  Ambulation/Gait             General Gait Details: Unable   Stairs              Wheelchair Mobility    Modified Rankin (Stroke Patients Only) Modified Rankin (Stroke Patients Only) Pre-Morbid Rankin Score: No symptoms Modified Rankin: Severe disability     Balance Overall balance assessment: Needs assistance   Sitting balance-Leahy Scale: Poor Sitting balance - Comments: Still with tendency to list R without external support or UE assist   Standing balance support: During functional activity;Bilateral upper extremity supported Standing balance-Leahy Scale: Zero Standing balance comment: 3 trials sit to stand in the STEDY.  Pt able to attain submaximal upright posture on 2/3 trials, the 3rd trial able to get the seat out from under him readily                            Cognition Arousal/Alertness: Awake/alert Behavior During Therapy: [facility] for tasks assessed/performed Overall Cognitive Status: Impaired/Different from baseline (NT formally)                   Orientation Level: Time Current Attention Level: Sustained Memory: Decreased short-term memory Following Commands: Follows one step commands with increased time;Follows one step commands inconsistently Safety/Judgement: Decreased awareness of safety;Decreased awareness of deficits Awareness: Intellectual Problem Solving: Slow processing;Decreased initiation;Requires tactile cues General Comments: Requires frequent redirection to task      Exercises Other Exercises Other Exercises: Warm up ROM/strengthening LE exercise for hip/knee flex/ext with graded resistance and control assist R LE.    General Comments General comments (skin integrity, edema, etc.): transfer to bed aborted due to pt to go to MRI imminently,  VSS on RA  with a few drops into the 80's for O2 saturations, but with less than optimal waveform.      Pertinent Vitals/Pain Pain Assessment: Faces Faces Pain Scale: Hurts little more Pain Location: muscle under area L ant thigh Pain Descriptors /  Indicators: Burning;Discomfort Pain Intervention(s): Monitored during session    Home Living     Available Help at Discharge: Family;Available PRN/intermittently Type of Home: Other(Comment)              Prior Function            PT Goals (current goals can now be found in the care plan section) Acute Rehab PT Goals PT Goal Formulation: With patient Time For Goal Achievement: 11/08/19 Potential to Achieve Goals: Fair Progress towards PT goals: Progressing toward goals    Frequency    Min 4X/week      PT Plan Current plan remains appropriate    Co-evaluation              AM-PAC PT "6 Clicks" Mobility   Outcome Measure  Help needed turning from your back to your side while in a flat bed without using bedrails?: A Lot Help needed moving from lying on your back to sitting on the side of a flat bed without using bedrails?: Total Help needed moving to and from a bed to a chair (including a wheelchair)?: Total Help needed standing up from a chair using your arms (e.g., wheelchair or bedside chair)?: Total Help needed to walk in hospital room?: Total Help needed climbing 3-5 steps with a railing? : Total 6 Click Score: 7    End of Session   Activity Tolerance: Patient tolerated treatment well;Patient limited by fatigue Patient left: in bed;with call bell/phone within reach;with bed alarm set;Other (comment) (pt going imminently to MRI) Nurse Communication: Mobility status;Need for lift equipment PT Visit Diagnosis: Other abnormalities of gait and mobility (R26.89);Muscle weakness (generalized) (M62.81);Other symptoms and signs involving the nervous system (R29.898)     Time: 1400-1435 PT Time Calculation (min) (ACUTE ONLY): 35 min  Charges:  $Therapeutic Activity: 8-22 mins $Neuromuscular Re-education: 8-22 mins                     10/26/2019  Micheal Bell., PT Acute Rehabilitation Services [phone]  (pager) [phone]  (office)   Micheal Bell  Micheal Bell 10/26/2019, 3:25 PM

## 2019-10-26 NOTE — Progress Notes (Signed)
Patient states that he is extremely exhausted and would like to go to MRI in the morning. Contacted MRI to reschedule for the AM.   Patient also stated that he would like to see a chaplain. This RN will place a consult.

## 2019-10-26 NOTE — Consult Note (Signed)
ORTHOPAEDIC CONSULTATION  REQUESTING PHYSICIAN: Leroy Sea, MD  Chief Complaint: Ulceration and cellulitis lateral left foot.  HPI: Micheal Bell is a 49 y.o. male who presents with Covid as well as a chronic fracture through the shaft of the fourth and fifth metatarsals with a plantar ulcer with cellulitis laterally.  Past Medical History:  Diagnosis Date  . Arthritis   . Asthma   . CHF (congestive heart failure) (HCC)   . Diabetes mellitus without complication (HCC)   . Fibromyalgia   . Gout   . Hypertension   . IBS (irritable bowel syndrome)   . Vein disorder    Past Surgical History:  Procedure Laterality Date  . FOOT SURGERY    . TEE WITHOUT CARDIOVERSION N/A 10/24/2019   Procedure: TRANSESOPHAGEAL ECHOCARDIOGRAM (TEE);  Surgeon: [name], MD;  Location: [facility];  Service: Cardiovascular;  Laterality: N/A;   Social History   Socioeconomic History  . Marital status: Single    Spouse name: Not on file  . Number of children: Not on file  . Years of education: Not on file  . Highest education level: Not on file  Occupational History  . Not on file  Tobacco Use  . Smoking status: Never Smoker  . Smokeless tobacco: Never Used  Vaping Use  . Vaping Use: Never used  Substance and Sexual Activity  . Alcohol use: Never  . Drug use: Never  . Sexual activity: Not on file  Other Topics Concern  . Not on file  Social History Narrative  . Not on file   Social Determinants of Health   Financial Resource Strain:   . Difficulty of Paying Living Expenses: Not on file  Food Insecurity:   . Worried About Programme researcher, broadcasting/film/video in the Last Year: Not on file  . Ran Out of Food in the Last Year: Not on file  Transportation Needs:   . Lack of Transportation (Medical): Not on file  . Lack of Transportation (Non-Medical): Not on file  Physical Activity:   . Days of Exercise per Week: Not on file  . Minutes of Exercise per Session: Not on file  Stress:     . Feeling of Stress : Not on file  Social Connections:   . Frequency of Communication with Friends and Family: Not on file  . Frequency of Social Gatherings with Friends and Family: Not on file  . Attends Religious Services: Not on file  . Active Member of Clubs or Organizations: Not on file  . Attends Banker Meetings: Not on file  . Marital Status: Not on file   No family history on file. - negative except otherwise stated in the family history section Allergies  Allergen Reactions  . Azithromycin   . Lodine [Etodolac] Anaphylaxis   Prior to Admission medications   Medication Sig Start Date End Date Taking? Authorizing Provider  allopurinol (ZYLOPRIM) 300 MG tablet Take 300 mg by mouth daily.   Yes [provider]  buPROPion (WELLBUTRIN XL) 150 MG 24 hr tablet Take 150 mg by mouth every morning. 09/08/19  Yes [provider]  chlorthalidone (HYGROTON) 25 MG tablet Take 25 mg by mouth daily.   Yes [provider]  citalopram (CELEXA) 40 MG tablet Take 40 mg by mouth daily.   Yes [provider]  cyclobenzaprine (FLEXERIL) 10 MG tablet Take 10 mg by mouth 3 (three) times daily as needed for muscle spasms.   Yes [provider]  furosemide (LASIX) 20 MG tablet Take 20 mg by mouth.   Yes [provider]  glipiZIDE (GLUCOTROL) 10 MG tablet Take 10 mg by mouth daily before breakfast.   Yes [provider]  HYDROcodone-acetaminophen (NORCO/VICODIN) 5-325 MG tablet Take 1 tablet by mouth every 6 (six) hours as needed for moderate pain.   Yes [provider]  meloxicam (MOBIC) 15 MG tablet Take 15 mg by mouth daily.   Yes [provider]  montelukast (SINGULAIR) 10 MG tablet Take 10 mg by mouth at bedtime.   Yes [provider]  nitroGLYCERIN (NITRODUR - DOSED IN MG/24 HR) 0.2 mg/hr patch Place 0.2 mg onto the skin daily.   Yes [provider]  nortriptyline (PAMELOR) 25 MG capsule  Take 25 mg by mouth at bedtime.   Yes [provider]  pantoprazole (PROTONIX) 40 MG tablet Take 40 mg by mouth daily.   Yes [provider]  pioglitazone (ACTOS) 15 MG tablet Take 15 mg by mouth daily. 08/11/19  Yes [provider]  predniSONE (DELTASONE) 10 MG tablet Take 10 mg by mouth daily with breakfast.   Yes [provider]  verapamil (CALAN-SR) 240 MG CR tablet Take 240 mg by mouth at bedtime.   Yes [provider]  zolpidem (AMBIEN) 10 MG tablet Take 10 mg by mouth at bedtime as needed for sleep.   Yes [provider]  albuterol (PROVENTIL HFA;VENTOLIN HFA) 108 (90 Base) MCG/ACT inhaler Inhale into the lungs every 6 (six) hours as needed for wheezing or shortness of breath.    [provider]  Fluticasone Propionate, Inhal, (FLOVENT IN) Inhale into the lungs.    [provider]  ibuprofen (ADVIL) 600 MG tablet Take 600 mg by mouth every 6 (six) hours as needed (pain).  03/21/19   [provider]  lidocaine (LIDODERM) 5 % Place 1 patch onto the skin daily. Remove & Discard patch within 12 hours or as directed by MD 05/12/17   Rise Mu, PA-C  mometasone-formoterol (DULERA) 100-5 MCG/ACT AERO Inhale 2 puffs into the lungs 2 (two) times daily.    [provider]  traMADol (ULTRAM) 50 MG tablet Take 1 tablet (50 mg total) by mouth every 6 (six) hours as needed. Patient not taking: Reported on 10/12/2019 03/16/17   Rolland Porter, MD   MR BRAIN WO CONTRAST  Result Date: 10/25/2019 CLINICAL DATA:  Neuro deficit, acute, stroke suspected. Additional history provided: COVID positive. Additional history obtained from electronic MEDICAL RECORD NUMBERSepsis with MSSA and group B strep bacteremia. EXAM: MRI HEAD WITHOUT CONTRAST TECHNIQUE: Multiplanar, multiecho pulse sequences of the brain and surrounding structures were obtained without intravenous contrast. COMPARISON:  Head CT 10/19/2019. FINDINGS: Brain: The  examination is limited by poor signal on several sequences as well as motion degradation. Most notably, there is moderate motion degradation of the axial T2 weighted sequence, severe motion degradation of the axial SWI sequence and moderate motion degradation of the coronal T2 weighted sequence. Cerebral volume is normal for age. There is a 17 mm focus of restricted diffusion and corresponding T2/FLAIR hyperintensity within the subcortical left frontoparietal lobes (for instance as seen on series 2, image 39) (series 3, image 17). Additional mild scattered T2/FLAIR hyperintensity within the cerebral white matter and pons is nonspecific, but consistent with chronic small vessel ischemic disease. Severe motion degradation of the axial SWI sequence precludes adequate evaluation for intracranial blood products. No extra-axial fluid collection. No midline shift. Vascular: Expected proximal arterial flow  voids. Skull and upper cervical spine: No focal marrow lesion is identified within described limitations. Sinuses/Orbits: Visualized orbits show no acute finding. Air-fluid levels within the sphenoid and right maxillary sinuses. Partial opacification of right ethmoid air cells. Bilateral mastoid effusions. These results will be called to the ordering clinician or representative by the Radiologist Assistant, and communication documented in the PACS or [employer]. IMPRESSION: Examination limited by poor signal on several sequences as well as motion degradation, as described. 17 mm focus of restricted diffusion and T2/FLAIR hyperintensity within the subcortical left frontoparietal lobes. This finding is nonspecific, but given the patient's history, the primary differential considerations are acute/early subacute infarct versus cerebritis. Consider contrast-enhanced MR imaging of the brain for further evaluation of this lesion. Mild chronic small vessel ischemic disease. Paranasal sinus disease with air-fluid levels.  Bilateral mastoid effusions. Electronically Signed   By: Jackey Loge DO   On: 10/25/2019 15:16   MR BRAIN W CONTRAST  Result Date: 10/25/2019 CLINICAL DATA:  Brain mass follow-up EXAM: MRI HEAD WITH CONTRAST TECHNIQUE: Multiplanar, multiecho pulse sequences of the brain and surrounding structures were obtained with intravenous contrast. CONTRAST:  68mL GADAVIST GADOBUTROL 1 MMOL/ML IV SOLN COMPARISON:  Brain MRI without contrast 10/25/2019 FINDINGS: Brain: There is a peripherally enhancing lesion at the base of the left precentral gyrus that measures 0.9 x 0.7 x 1.8 cm. There are no other areas of abnormal contrast enhancement. 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 Electronically Signed   By: Deatra Robinson M.D.   On: 10/25/2019 22:47   MR FOOT LEFT WO CONTRAST  Result Date: 10/25/2019 CLINICAL DATA:  Bacteremia left foot pain and diabetic EXAM: MRI OF THE LEFT FOOT WITHOUT CONTRAST TECHNIQUE: Multiplanar, multisequence MR imaging of the left was performed. No intravenous contrast was administered. COMPARISON:  October 23, 2019 FINDINGS: Bones/Joint/Cartilage Mildly angulated nondisplaced fractures of the midshaft of the fourth and fifth metatarsals are noted. There is periosteal reaction seen along the medial margins of the metatarsal shafts. Increased heterogeneous T2 hyperintense signal with subtle T1 hypointensity seen at the base of the fourth and fifth metatarsals. No other osseous marrow signal abnormality is seen. There is no large knee joint effusions noted. The articular surfaces appear to be maintained. Ligaments The Lisfranc ligament is intact. Muscles and Tendons Increased feathery signal with atrophy is seen throughout the muscles of the forefoot. There is a small amount of fluid seen surrounding the posterior tibialis tendon. The remainder of the flexor and extensor tendons are intact. Soft  tissues Lateral plantar surface of the forefoot overlying the fifth metatarsal base there is a focal area of ulceration measuring approximately 8 mm in transverse dimension. A fluid-filled sinus tract is seen extending to the overlying osseous surface. There is a multilocular fluid collection which extends around the dorsal surface of the fifth metatarsal measuring approximately 5.4 x 0.9 x 3.4 cm. There is extensive dorsal and lateral subcutaneous edema and skin thickening. IMPRESSION: Superficial area of ulceration over the plantar lateral aspect of the fifth metatarsal base with a fluid-filled sinus tract and loculated probable abscess extending over the dorsal surface of the fifth metatarsal measuring 5.4 x 0.9 by is a 3.4 cm. Incomplete mildly angulated fourth and fifth metatarsal shaft fractures with periosteal reaction Findings which could be suggestive of reactive marrow versus early osteomyelitis involving the base of the fourth and fifth metatarsals. Electronically Signed   By: Jonna Clark  M.D.   On: 10/25/2019 19:06   DG Chest Port 1 View  Result Date: 10/26/2019 CLINICAL DATA:  Short of breath.  COVID positive EXAM: PORTABLE CHEST 1 VIEW COMPARISON:  10/25/2019 FINDINGS: Hypoventilation with decreased lung volume. Mild bibasilar airspace disease unchanged. No new infiltrate or effusion. Right arm PICC tip in the SVC unchanged. IMPRESSION: Hypoventilation with bibasilar mild airspace disease unchanged. Electronically Signed   By: Marlan Palau M.D.   On: 10/26/2019 16:10   DG Chest Port 1 View  Result Date: 10/25/2019 CLINICAL DATA:  Shortness of breath. EXAM: PORTABLE CHEST 1 VIEW COMPARISON:  October 16, 2019. FINDINGS: The heart size and mediastinal contours are within normal limits. Hypoinflation of the lungs is noted with mild bibasilar subsegmental atelectasis. Endotracheal tube has been removed. Right-sided PICC line is unchanged in position. The visualized skeletal structures are  unremarkable. IMPRESSION: Hypoinflation of the lungs with mild bibasilar subsegmental atelectasis. Electronically Signed   By: Lupita Raider M.D.   On: 10/25/2019 08:37   VAS US CAROTID (at [facility] and [facility] only)  Result Date: 10/26/2019 Carotid Arterial Duplex Study Indications:       17mm focus of diffusion restriction in the left                    frontoparietal lobe concerning for acute/early subacute                    infarct vs cerebritis. Risk Factors:      Hypertension, Diabetes. Other Factors:     Covid-19, bacteremia. Comparison Study:  No prior study on file Performing Technologist: Sherren Kerns RVS  Examination Guidelines: A complete evaluation includes B-mode imaging, spectral Doppler, color Doppler, and power Doppler as needed of all accessible portions of each vessel. Bilateral testing is considered an integral part of a complete examination. Limited examinations for reoccurring indications may be performed as noted.  Right Carotid Findings: +----------+--------+--------+--------+------------------+--------+           PSV cm/sEDV cm/sStenosisPlaque DescriptionComments +----------+--------+--------+--------+------------------+--------+ CCA Prox  66      11                                         +----------+--------+--------+--------+------------------+--------+ CCA Distal71      18                                         +----------+--------+--------+--------+------------------+--------+ ICA Prox  58      19                                         +----------+--------+--------+--------+------------------+--------+ ICA Distal90      33                                         +----------+--------+--------+--------+------------------+--------+ ECA       114     16                                         +----------+--------+--------+--------+------------------+--------+ +----------+--------+-------+--------+-------------------+  PSV cm/sEDV  cmsDescribeArm Pressure (mmHG) +----------+--------+-------+--------+-------------------+ [id number]                                        +----------+--------+-------+--------+-------------------+ +---------+--------+--+--------+--+ VertebralPSV cm/s38EDV cm/s11 +---------+--------+--+--------+--+  Left Carotid Findings: +----------+--------+--------+--------+------------------+--------+           PSV cm/sEDV cm/sStenosisPlaque DescriptionComments +----------+--------+--------+--------+------------------+--------+ CCA Prox  55      14                                         +----------+--------+--------+--------+------------------+--------+ CCA Distal65      20                                         +----------+--------+--------+--------+------------------+--------+ ICA Prox  68      20                                         +----------+--------+--------+--------+------------------+--------+ ICA Distal76      23                                         +----------+--------+--------+--------+------------------+--------+ ECA       77      3                                          +----------+--------+--------+--------+------------------+--------+ +----------+--------+--------+--------+-------------------+           PSV cm/sEDV cm/sDescribeArm Pressure (mmHG) +----------+--------+--------+--------+-------------------+ [id number]                                          +----------+--------+--------+--------+-------------------+ +---------+--------+--+--------+--+ VertebralPSV cm/s63EDV cm/s18 +---------+--------+--+--------+--+   Summary: Right Carotid: The extracranial vessels were near-normal with only minimal wall                thickening or plaque. Left Carotid: The extracranial vessels were near-normal with only minimal wall               thickening or plaque. Vertebrals:  Bilateral vertebral arteries demonstrate antegrade flow.  Subclavians: Normal flow hemodynamics were seen in bilateral subclavian              arteries. *See table(s) above for measurements and observations.  Electronically signed by Delia Heady MD on 10/26/2019 at 12:37:48 PM.    Final    VAS [place] LOWER EXTREMITY ARTERIAL DUPLEX  Result Date: 10/26/2019 LOWER EXTREMITY ARTERIAL DUPLEX STUDY Indications: Ulceration, and Osteomyelitis. High Risk Factors: Hypertension, Diabetes. Other Factors: Covid-19. Charcot foot.  Current ABI: N/A Comparison Study: No prior study Performing Technologist: Sherren Kerns RVS  Examination Guidelines: A complete evaluation includes B-mode imaging, spectral Doppler, color Doppler, and power Doppler as needed of all accessible portions of each vessel. Bilateral testing is considered an integral part of a complete examination. Limited examinations for reoccurring indications may be performed as  noted.  +----------+--------+-----+--------+---------+--------+ LEFT      PSV cm/sRatioStenosisWaveform Comments +----------+--------+-----+--------+---------+--------+ CFA Prox  123                  triphasic         +----------+--------+-----+--------+---------+--------+ DFA       51                   triphasic         +----------+--------+-----+--------+---------+--------+ SFA Prox  97                   triphasic         +----------+--------+-----+--------+---------+--------+ SFA Mid   88                   triphasic         +----------+--------+-----+--------+---------+--------+ SFA Distal81                   triphasic         +----------+--------+-----+--------+---------+--------+ POP Prox  60                   triphasic         +----------+--------+-----+--------+---------+--------+ POP Distal86                   triphasic         +----------+--------+-----+--------+---------+--------+ ATA Distal110                  triphasic          +----------+--------+-----+--------+---------+--------+ PTA Prox  34                   triphasic         +----------+--------+-----+--------+---------+--------+ PTA Mid   29                   triphasic         +----------+--------+-----+--------+---------+--------+ PTA [id number]                  triphasic         +----------+--------+-----+--------+---------+--------+  Summary: Left: Normal waveforms and adequate flow noted.  See table(s) above for measurements and observations. Electronically signed by Lemar Livings MD on 10/26/2019 at 4:50:14 PM.    Final    VAS [place] LOWER EXTREMITY VENOUS (DVT)  Result Date: 10/26/2019  Lower Venous DVTStudy Indications: Covid-19, elevated D-Dimer.  Comparison Study: Prior negative left lower extremity venous duplex don 03/19/17                   and is available for comparison. Performing Technologist: Sherren Kerns RVS  Examination Guidelines: A complete evaluation includes B-mode imaging, spectral Doppler, color Doppler, and power Doppler as needed of all accessible portions of each vessel. Bilateral testing is considered an integral part of a complete examination. Limited examinations for reoccurring indications may be performed as noted. The reflux portion of the exam is performed with the patient in reverse Trendelenburg.  +---------+---------------+---------+-----------+----------+--------------+ RIGHT    CompressibilityPhasicitySpontaneityPropertiesThrombus Aging +---------+---------------+---------+-----------+----------+--------------+ CFV      Full           Yes      Yes                                 +---------+---------------+---------+-----------+----------+--------------+ SFJ      Full                                                        +---------+---------------+---------+-----------+----------+--------------+  FV Prox  Full                                                         +---------+---------------+---------+-----------+----------+--------------+ FV Mid   Full                                                        +---------+---------------+---------+-----------+----------+--------------+ FV DistalFull                                                        +---------+---------------+---------+-----------+----------+--------------+ PFV      Full                                                        +---------+---------------+---------+-----------+----------+--------------+ POP      Partial        Yes      Yes                  Acute          +---------+---------------+---------+-----------+----------+--------------+ PTV      Full                                                        +---------+---------------+---------+-----------+----------+--------------+ PERO     Full                                                        +---------+---------------+---------+-----------+----------+--------------+   +---------+---------------+---------+-----------+----------+--------------+ LEFT     CompressibilityPhasicitySpontaneityPropertiesThrombus Aging +---------+---------------+---------+-----------+----------+--------------+ CFV      Full           Yes      Yes                                 +---------+---------------+---------+-----------+----------+--------------+ SFJ      Full                                                        +---------+---------------+---------+-----------+----------+--------------+ FV Prox  Full                                                        +---------+---------------+---------+-----------+----------+--------------+  FV Mid   Full                                                        +---------+---------------+---------+-----------+----------+--------------+ FV DistalFull                                                         +---------+---------------+---------+-----------+----------+--------------+ PFV      Full                                                        +---------+---------------+---------+-----------+----------+--------------+ POP      Full           Yes      Yes                                 +---------+---------------+---------+-----------+----------+--------------+ PTV      Full                                                        +---------+---------------+---------+-----------+----------+--------------+ PERO     Full                                                        +---------+---------------+---------+-----------+----------+--------------+     Summary: RIGHT: - Findings consistent with acute deep vein thrombosis involving the right popliteal vein. - Ultrasound characteristics of enlarged lymph nodes are noted in the groin.  LEFT: - There is no evidence of deep vein thrombosis in the lower extremity.  *See table(s) above for measurements and observations. Electronically signed by Lemar Livings MD on 10/26/2019 at 4:50:27 PM.    Final    - pertinent xrays, CT, MRI studies were reviewed and independently interpreted  Positive ROS: All other systems have been reviewed and were otherwise negative with the exception of those mentioned in the HPI and as above.  Physical Exam: General: Alert, no acute distress Psychiatric: Patient is competent for consent with normal mood and affect Lymphatic: No axillary or cervical lymphadenopathy Cardiovascular: No pedal edema Respiratory: No cyanosis, no use of accessory musculature GI: No organomegaly, abdomen is soft and non-tender    Images:  @ENCIMAGES @  Labs:  Lab Results  Component Value Date   HGBA1C 11.3 (H) 10/26/2019   HGBA1C 11.6 (H) 10/10/2019   CRP 5.4 (H) 10/26/2019   CRP 3.7 (H) 10/25/2019   CRP 5.1 (H) 10/24/2019   REPTSTATUS PENDING 10/23/2019   GRAMSTAIN  10/12/2019    FEW WBC PRESENT, PREDOMINANTLY  PMN FEW GRAM POSITIVE COCCI IN PAIRS IN CLUSTERS RARE YEAST Performed at [facility], [address] [place], [place] [address]    CULT  10/23/2019    NO GROWTH 3 DAYS Performed at [facility], [address], [place], [place] [address]    [facility] STAPHYLOCOCCUS AUREUS 10/12/2019    Lab Results  Component Value Date   ALBUMIN 2.3 (L) 10/26/2019   ALBUMIN 2.5 (L) 10/25/2019   ALBUMIN 2.3 (L) 10/24/2019    Neurologic: Patient does not have protective sensation bilateral lower extremities.   MUSCULOSKELETAL:   Skin: Examination patient has cellulitis laterally over the left foot but this clinically appears to be improving the skin is wrinkling well there is an insensate Wagner grade 1 ulcer on the plantar aspect of the fifth metatarsal head but there is no purulent drainage.  Patient's hemoglobin A1c has been consistently above 11.  Radiographs shows a stress fracture through the shaft of the fourth and fifth metatarsals.  Review the MRI scan appears to show an abscess lateral to the fifth metatarsal with nonunion of the fractures.  Doppler showed triphasic flow to the foot.  Albumin 2.3.  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  Plan: I would recommend follow-up orthopedically in the office after discharge.  After IV antibiotics are completed recommend discharge on oral doxycycline.  I will follow-up in the office 1 week after discharge.  Thank you for the consult and the opportunity to see Mr. Micheil Klaus, MD [facility] [phone] 5:26 PM

## 2019-10-26 NOTE — Consult Note (Signed)
Reason for Consult:Left foot osteo Referring Physician: P Shirley Bell is an 49 y.o. male.  HPI: Micheal Bell has been dealing with a left foot ulceration for months, possibly longer than a year. He describes intermittent discharge and intermittent short, sharp pains in the foot. He was admitted over 2 weeks ago with Covid PNA. He developed sepsis and bacteremia as complications.   Past Medical History:  Diagnosis Date  . Arthritis   . Asthma   . CHF (congestive heart failure) (HCC)   . Diabetes mellitus without complication (HCC)   . Fibromyalgia   . Gout   . Hypertension   . IBS (irritable bowel syndrome)   . Vein disorder     Past Surgical History:  Procedure Laterality Date  . FOOT SURGERY    . TEE WITHOUT CARDIOVERSION N/A 10/24/2019   Procedure: TRANSESOPHAGEAL ECHOCARDIOGRAM (TEE);  Surgeon: [name], MD;  Location: [facility] ENDOSCOPY;  Service: Cardiovascular;  Laterality: N/A;    No family history on file.  Social History:  reports that he has never smoked. He has never used smokeless tobacco. He reports that he does not drink alcohol and does not use drugs.  Allergies:  Allergies  Allergen Reactions  . Azithromycin   . Lodine [Etodolac] Anaphylaxis    Medications: I have reviewed the patient's current medications.  Results for orders placed or performed during the hospital encounter of 10/09/19 (from the past 48 hour(s))  Glucose, capillary     Status: Abnormal   Collection Time: 10/24/19 11:39 AM  Result Value Ref Range   Glucose-Capillary 159 (H) 70 - 99 mg/dL    Comment: Glucose reference range applies only to samples taken after fasting for at least 8 hours.  Glucose, capillary     Status: Abnormal   Collection Time: 10/24/19  4:04 PM  Result Value Ref Range   Glucose-Capillary 167 (H) 70 - 99 mg/dL    Comment: Glucose reference range applies only to samples taken after fasting for at least 8 hours.  Glucose, capillary     Status: Abnormal    Collection Time: 10/24/19  8:11 PM  Result Value Ref Range   Glucose-Capillary 107 (H) 70 - 99 mg/dL    Comment: Glucose reference range applies only to samples taken after fasting for at least 8 hours.   Comment 1 Document in Chart   Glucose, capillary     Status: None   Collection Time: 10/25/19 12:22 AM  Result Value Ref Range   Glucose-Capillary 99 70 - 99 mg/dL    Comment: Glucose reference range applies only to samples taken after fasting for at least 8 hours.   Comment 1 Document in Chart   Glucose, capillary     Status: Abnormal   Collection Time: 10/25/19  3:26 AM  Result Value Ref Range   Glucose-Capillary 102 (H) 70 - 99 mg/dL    Comment: Glucose reference range applies only to samples taken after fasting for at least 8 hours.  CBC with Differential/Platelet     Status: Abnormal   Collection Time: 10/25/19  3:52 AM  Result Value Ref Range   WBC 15.5 (H) 4.0 - 10.5 K/uL   RBC 4.26 4.22 - 5.81 MIL/uL   Hemoglobin 13.2 13.0 - 17.0 g/dL   HCT 40.9 39 - 52 %   MCV 94.1 80.0 - 100.0 fL   MCH 31.0 26.0 - 34.0 pg   MCHC 32.9 30.0 - 36.0 g/dL   RDW 81.1 91.4 - 78.2 %  Platelets 506 (H) 150 - 400 K/uL   nRBC 0.0 0.0 - 0.2 %   Neutrophils Relative % 75 %   Neutro Abs 11.8 (H) 1.7 - 7.7 K/uL   Lymphocytes Relative 16 %   Lymphs Abs 2.4 0.7 - 4.0 K/uL   Monocytes Relative 5 %   Monocytes Absolute 0.8 0 - 1 K/uL   Eosinophils Relative 1 %   Eosinophils Absolute 0.1 0 - 0 K/uL   Basophils Relative 1 %   Basophils Absolute 0.1 0 - 0 K/uL   Immature Granulocytes 2 %   Abs Immature Granulocytes 0.23 (H) 0.00 - 0.07 K/uL    Comment: Performed at [facility], [address], [place], [place] [address]  Comprehensive metabolic panel     Status: Abnormal   Collection Time: 10/25/19  3:52 AM  Result Value Ref Range   Sodium 141 135 - 145 mmol/L   Potassium 3.3 (L) 3.5 - 5.1 mmol/L   Chloride 102 98 - 111 mmol/L   CO2 27 22 - 32 mmol/L   Glucose, Bld 100 (H) 70 - 99 mg/dL     Comment: Glucose reference range applies only to samples taken after fasting for at least 8 hours.   BUN 17 6 - 20 mg/dL   Creatinine, Ser 3.23 0.61 - 1.24 mg/dL   Calcium 9.1 8.9 - 55.7 mg/dL   Total Protein 6.9 6.5 - 8.1 g/dL   Albumin 2.5 (L) 3.5 - 5.0 g/dL   AST 30 15 - 41 U/L   ALT 25 0 - 44 U/L   Alkaline Phosphatase 105 38 - 126 U/L   Total Bilirubin 0.4 0.3 - 1.2 mg/dL   GFR calc non Af Amer >60 >60 mL/min   GFR calc Af Amer >60 >60 mL/min   Anion gap 12 5 - 15    Comment: Performed at [facility], [address] [place], [place] [address]  C-reactive protein     Status: Abnormal   Collection Time: 10/25/19  3:52 AM  Result Value Ref Range   CRP 3.7 (H) <1.0 mg/dL    Comment: Performed at [facility], [address], [place], [place] [address]  D-dimer, quantitative (not at [facility])     Status: Abnormal   Collection Time: 10/25/19  3:52 AM  Result Value Ref Range   D-Dimer, Quant 5.12 (H) 0.00 - 0.50 ug/mL-FEU    Comment: (NOTE) At the manufacturer cut-off of 0.50 ug/mL FEU, this assay has been documented to exclude PE with a sensitivity and negative predictive value of 97 to 99%.  At this time, this assay has not been approved by the FDA to exclude DVT/VTE. Results should be correlated with clinical presentation. Performed at [facility], [address] [place], [place] [address]   Magnesium     Status: None   Collection Time: 10/25/19  3:52 AM  Result Value Ref Range   Magnesium 1.7 1.7 - 2.4 mg/dL    Comment: Performed at [facility], [address] [place], [place] [address]  Phosphorus     Status: None   Collection Time: 10/25/19  3:52 AM  Result Value Ref Range   Phosphorus 4.4 2.5 - 4.6 mg/dL    Comment: Performed at [facility], [address], [place], [place] [address]  Ferritin     Status: Abnormal   Collection Time: 10/25/19  3:52 AM  Result Value Ref Range   Ferritin 658 (  H) 24 - 336 ng/mL    Comment: Performed  at [facility], [address], [place], [place] [address]  Brain natriuretic peptide     Status: None   Collection Time: 10/25/19  3:53 AM  Result Value Ref Range   B Natriuretic Peptide 34.4 0.0 - 100.0 pg/mL    Comment: Performed at [facility], [address], [place], [place] [address]  ABO/Rh     Status: None   Collection Time: 10/25/19  5:00 AM  Result Value Ref Range   ABO/RH(D)      O POS Performed at [facility], [address] [place], [place] [address]   Glucose, capillary     Status: Abnormal   Collection Time: 10/25/19  7:26 AM  Result Value Ref Range   Glucose-Capillary 122 (H) 70 - 99 mg/dL    Comment: Glucose reference range applies only to samples taken after fasting for at least 8 hours.  Glucose, capillary     Status: Abnormal   Collection Time: 10/25/19 11:44 AM  Result Value Ref Range   Glucose-Capillary 183 (H) 70 - 99 mg/dL    Comment: Glucose reference range applies only to samples taken after fasting for at least 8 hours.  Protime-INR     Status: None   Collection Time: 10/25/19  4:00 PM  Result Value Ref Range   Prothrombin Time 14.7 11.4 - 15.2 seconds   INR 1.2 0.8 - 1.2    Comment: (NOTE) INR goal varies based on device and disease states. Performed at [facility], [address] [place], [place] [address]   Type and screen     Status: None   Collection Time: 10/25/19  4:00 PM  Result Value Ref Range   ABO/RH(D) O POS    Antibody Screen NEG    Sample Expiration      10/28/2019,2359 Performed at [facility], [address], [place], [place] [address]   Procalcitonin - Baseline     Status: None   Collection Time: 10/25/19  4:00 PM  Result Value Ref Range   Procalcitonin <0.10 ng/mL    Comment:        Interpretation: PCT (Procalcitonin) <= 0.5 ng/mL: Systemic infection (sepsis) is not likely. Local bacterial infection is possible. (NOTE)       Sepsis PCT Algorithm           Lower Respiratory Tract                                       Infection PCT Algorithm    ----------------------------     ----------------------------         PCT < 0.25 ng/mL                PCT < 0.10 ng/mL          Strongly encourage             Strongly discourage   discontinuation of antibiotics    initiation of antibiotics    ----------------------------     -----------------------------       PCT 0.25 - 0.50 ng/mL            PCT 0.10 - 0.25 ng/mL               OR       >80% decrease in PCT  Discourage initiation of                                            antibiotics      Encourage discontinuation           of antibiotics    ----------------------------     -----------------------------         PCT >= 0.50 ng/mL              PCT 0.26 - 0.50 ng/mL               AND        <80% decrease in PCT             Encourage initiation of                                             antibiotics       Encourage continuation           of antibiotics    ----------------------------     -----------------------------        PCT >= 0.50 ng/mL                  PCT > 0.50 ng/mL               AND         increase in PCT                  Strongly encourage                                      initiation of antibiotics    Strongly encourage escalation           of antibiotics                                     -----------------------------                                           PCT <= 0.25 ng/mL                                                 OR                                        > 80% decrease in PCT                                      Discontinue / Do not initiate  antibiotics  Performed at [facility], [address], [place], [place] [address]   Glucose, capillary     Status: Abnormal   Collection Time: 10/25/19  4:50 PM  Result Value Ref Range   Glucose-Capillary 236 (H) 70 - 99 mg/dL    Comment: Glucose reference range applies only to samples  taken after fasting for at least 8 hours.  Glucose, capillary     Status: Abnormal   Collection Time: 10/25/19  7:46 PM  Result Value Ref Range   Glucose-Capillary 278 (H) 70 - 99 mg/dL    Comment: Glucose reference range applies only to samples taken after fasting for at least 8 hours.  Glucose, capillary     Status: Abnormal   Collection Time: 10/26/19  1:29 AM  Result Value Ref Range   Glucose-Capillary 113 (H) 70 - 99 mg/dL    Comment: Glucose reference range applies only to samples taken after fasting for at least 8 hours.  Glucose, capillary     Status: Abnormal   Collection Time: 10/26/19  3:30 AM  Result Value Ref Range   Glucose-Capillary 101 (H) 70 - 99 mg/dL    Comment: Glucose reference range applies only to samples taken after fasting for at least 8 hours.  Magnesium     Status: Abnormal   Collection Time: 10/26/19  4:02 AM  Result Value Ref Range   Magnesium 1.5 (L) 1.7 - 2.4 mg/dL    Comment: Performed at [facility], [address], [place], [place] [address]  D-dimer, quantitative (not at [facility])     Status: Abnormal   Collection Time: 10/26/19  4:02 AM  Result Value Ref Range   D-Dimer, Quant 6.29 (H) 0.00 - 0.50 ug/mL-FEU    Comment: (NOTE) At the manufacturer cut-off of 0.50 ug/mL FEU, this assay has been documented to exclude PE with a sensitivity and negative predictive value of 97 to 99%.  At this time, this assay has not been approved by the FDA to exclude DVT/VTE. Results should be correlated with clinical presentation. Performed at [facility], [address], [place], [place] [address]   C-reactive protein     Status: Abnormal   Collection Time: 10/26/19  4:02 AM  Result Value Ref Range   CRP 5.4 (H) <1.0 mg/dL    Comment: Performed at [facility], [address] [place], [place] [address]  Comprehensive metabolic panel     Status: Abnormal   Collection Time: 10/26/19  4:02 AM  Result Value Ref Range   Sodium 134 (L) 135 - 145  mmol/L   Potassium 3.5 3.5 - 5.1 mmol/L   Chloride 101 98 - 111 mmol/L   CO2 24 22 - 32 mmol/L   Glucose, Bld 111 (H) 70 - 99 mg/dL    Comment: Glucose reference range applies only to samples taken after fasting for at least 8 hours.   BUN 12 6 - 20 mg/dL   Creatinine, Ser 0.86 0.61 - 1.24 mg/dL   Calcium 8.6 (L) 8.9 - 10.3 mg/dL   Total Protein 6.3 (L) 6.5 - 8.1 g/dL   Albumin 2.3 (L) 3.5 - 5.0 g/dL   AST 23 15 - 41 U/L   ALT 16 0 - 44 U/L   Alkaline Phosphatase 89 38 - 126 U/L   Total Bilirubin 1.2 0.3 - 1.2 mg/dL   GFR calc non Af Amer >60 >60 mL/min   GFR calc Af Amer >60 >60 mL/min  Anion gap 9 5 - 15    Comment: Performed at [facility], [address] [place], [place] [address]  Brain natriuretic peptide     Status: None   Collection Time: 10/26/19  4:02 AM  Result Value Ref Range   B Natriuretic Peptide 38.0 0.0 - 100.0 pg/mL    Comment: Performed at [facility], [address], [place], [place] [address]  CBC with Differential/Platelet     Status: Abnormal   Collection Time: 10/26/19  4:02 AM  Result Value Ref Range   WBC 14.3 (H) 4.0 - 10.5 K/uL   RBC 3.66 (L) 4.22 - 5.81 MIL/uL   Hemoglobin 11.2 (L) 13.0 - 17.0 g/dL   HCT 09.8 (L) 39 - 52 %   MCV 93.7 80.0 - 100.0 fL   MCH 30.6 26.0 - 34.0 pg   MCHC 32.7 30.0 - 36.0 g/dL   RDW 11.9 14.7 - 82.9 %   Platelets 396 150 - 400 K/uL   nRBC 0.0 0.0 - 0.2 %   Neutrophils Relative % 75 %   Neutro Abs 10.7 (H) 1.7 - 7.7 K/uL   Lymphocytes Relative 17 %   Lymphs Abs 2.4 0.7 - 4.0 K/uL   Monocytes Relative 6 %   Monocytes Absolute 0.8 0 - 1 K/uL   Eosinophils Relative 1 %   Eosinophils Absolute 0.1 0 - 0 K/uL   Basophils Relative 0 %   Basophils Absolute 0.1 0 - 0 K/uL   Immature Granulocytes 1 %   Abs Immature Granulocytes 0.19 (H) 0.00 - 0.07 K/uL    Comment: Performed at [facility], [address], [place], [place] [address]  Procalcitonin     Status: None   Collection Time: 10/26/19  4:02  AM  Result Value Ref Range   Procalcitonin <0.10 ng/mL    Comment:        Interpretation: PCT (Procalcitonin) <= 0.5 ng/mL: Systemic infection (sepsis) is not likely. Local bacterial infection is possible. (NOTE)       Sepsis PCT Algorithm           Lower Respiratory Tract                                      Infection PCT Algorithm    ----------------------------     ----------------------------         PCT < 0.25 ng/mL                PCT < 0.10 ng/mL          Strongly encourage             Strongly discourage   discontinuation of antibiotics    initiation of antibiotics    ----------------------------     -----------------------------       PCT 0.25 - 0.50 ng/mL            PCT 0.10 - 0.25 ng/mL               OR       >80% decrease in PCT            Discourage initiation of  antibiotics      Encourage discontinuation           of antibiotics    ----------------------------     -----------------------------         PCT >= 0.50 ng/mL              PCT 0.26 - 0.50 ng/mL               AND        <80% decrease in PCT             Encourage initiation of                                             antibiotics       Encourage continuation           of antibiotics    ----------------------------     -----------------------------        PCT >= 0.50 ng/mL                  PCT > 0.50 ng/mL               AND         increase in PCT                  Strongly encourage                                      initiation of antibiotics    Strongly encourage escalation           of antibiotics                                     -----------------------------                                           PCT <= 0.25 ng/mL                                                 OR                                        > 80% decrease in PCT                                      Discontinue / Do not initiate                                              antibiotics  Performed at [facility], [address], [place], [place] [address]   Hemoglobin A1c     Status: Abnormal   Collection  Time: 10/26/19  4:02 AM  Result Value Ref Range   Hgb A1c MFr Bld 11.3 (H) 4.8 - 5.6 %    Comment: (NOTE) Pre diabetes:          5.7%-6.4%  Diabetes:              >6.4%  Glycemic control for   <7.0% adults with diabetes    Mean Plasma Glucose 277.61 mg/dL    Comment: Performed at [facility], [address] [place], [place] [address]  Lipid panel     Status: None   Collection Time: 10/26/19  4:02 AM  Result Value Ref Range   Cholesterol 168 0 - 200 mg/dL   Triglycerides 578 <469 mg/dL   HDL 43 >62 mg/dL   Total CHOL/HDL Ratio 3.9 RATIO   VLDL 26 0 - 40 mg/dL   LDL Cholesterol 99 0 - 99 mg/dL    Comment:        Total Cholesterol/HDL:CHD Risk Coronary Heart Disease Risk Table                     Men   Women  1/2 Average Risk   3.4   3.3  Average Risk       5.0   4.4  2 X Average Risk   9.6   7.1  3 X Average Risk  23.4   11.0        Use the calculated Patient Ratio above and the CHD Risk Table to determine the patient's CHD Risk.        ATP III CLASSIFICATION (LDL):  <100     mg/dL   Optimal  952-841  mg/dL   Near or Above                    Optimal  130-159  mg/dL   Borderline  324-401  mg/dL   High  >027     mg/dL   Very High Performed at [facility], [address], [place], [place] [address]   Glucose, capillary     Status: Abnormal   Collection Time: 10/26/19  7:20 AM  Result Value Ref Range   Glucose-Capillary 132 (H) 70 - 99 mg/dL    Comment: Glucose reference range applies only to samples taken after fasting for at least 8 hours.    MR BRAIN WO CONTRAST  Result Date: 10/25/2019 CLINICAL DATA:  Neuro deficit, acute, stroke suspected. Additional history provided: COVID positive. Additional history obtained from electronic MEDICAL RECORD NUMBERSepsis with MSSA and group B strep bacteremia. EXAM: MRI HEAD  WITHOUT CONTRAST TECHNIQUE: Multiplanar, multiecho pulse sequences of the brain and surrounding structures were obtained without intravenous contrast. COMPARISON:  Head CT 10/19/2019. FINDINGS: Brain: The examination is limited by poor signal on several sequences as well as motion degradation. Most notably, there is moderate motion degradation of the axial T2 weighted sequence, severe motion degradation of the axial SWI sequence and moderate motion degradation of the coronal T2 weighted sequence. Cerebral volume is normal for age. There is a 17 mm focus of restricted diffusion and corresponding T2/FLAIR hyperintensity within the subcortical left frontoparietal lobes (for instance as seen on series 2, image 39) (series 3, image 17). Additional mild scattered T2/FLAIR hyperintensity within the cerebral white matter and pons is nonspecific, but consistent with chronic small vessel ischemic disease. Severe motion degradation of the axial SWI sequence precludes adequate evaluation for intracranial blood products. No extra-axial fluid collection. No  midline shift. Vascular: Expected proximal arterial flow voids. Skull and upper cervical spine: No focal marrow lesion is identified within described limitations. Sinuses/Orbits: Visualized orbits show no acute finding. Air-fluid levels within the sphenoid and right maxillary sinuses. Partial opacification of right ethmoid air cells. Bilateral mastoid effusions. These results will be called to the ordering clinician or representative by the Radiologist Assistant, and communication documented in the PACS or [employer]. IMPRESSION: Examination limited by poor signal on several sequences as well as motion degradation, as described. 17 mm focus of restricted diffusion and T2/FLAIR hyperintensity within the subcortical left frontoparietal lobes. This finding is nonspecific, but given the patient's history, the primary differential considerations are acute/early subacute  infarct versus cerebritis. Consider contrast-enhanced MR imaging of the brain for further evaluation of this lesion. Mild chronic small vessel ischemic disease. Paranasal sinus disease with air-fluid levels. Bilateral mastoid effusions. Electronically Signed   By: Jackey Loge DO   On: 10/25/2019 15:16   MR BRAIN W CONTRAST  Result Date: 10/25/2019 CLINICAL DATA:  Brain mass follow-up EXAM: MRI HEAD WITH CONTRAST TECHNIQUE: Multiplanar, multiecho pulse sequences of the brain and surrounding structures were obtained with intravenous contrast. CONTRAST:  10mL GADAVIST GADOBUTROL 1 MMOL/ML IV SOLN COMPARISON:  Brain MRI without contrast 10/25/2019 FINDINGS: Brain: There is a peripherally enhancing lesion at the base of the left precentral gyrus that measures 0.9 x 0.7 x 1.8 cm. There are no other areas of abnormal contrast enhancement. 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 Electronically Signed   By: Deatra Robinson M.D.   On: 10/25/2019 22:47   MR FOOT LEFT WO CONTRAST  Result Date: 10/25/2019 CLINICAL DATA:  Bacteremia left foot pain and diabetic EXAM: MRI OF THE LEFT FOOT WITHOUT CONTRAST TECHNIQUE: Multiplanar, multisequence MR imaging of the left was performed. No intravenous contrast was administered. COMPARISON:  October 23, 2019 FINDINGS: Bones/Joint/Cartilage Mildly angulated nondisplaced fractures of the midshaft of the fourth and fifth metatarsals are noted. There is periosteal reaction seen along the medial margins of the metatarsal shafts. Increased heterogeneous T2 hyperintense signal with subtle T1 hypointensity seen at the base of the fourth and fifth metatarsals. No other osseous marrow signal abnormality is seen. There is no large knee joint effusions noted. The articular surfaces appear to be maintained. Ligaments The Lisfranc ligament is intact. Muscles and Tendons Increased feathery signal  with atrophy is seen throughout the muscles of the forefoot. There is a small amount of fluid seen surrounding the posterior tibialis tendon. The remainder of the flexor and extensor tendons are intact. Soft tissues Lateral plantar surface of the forefoot overlying the fifth metatarsal base there is a focal area of ulceration measuring approximately 8 mm in transverse dimension. A fluid-filled sinus tract is seen extending to the overlying osseous surface. There is a multilocular fluid collection which extends around the dorsal surface of the fifth metatarsal measuring approximately 5.4 x 0.9 x 3.4 cm. There is extensive dorsal and lateral subcutaneous edema and skin thickening. IMPRESSION: Superficial area of ulceration over the plantar lateral aspect of the fifth metatarsal base with a fluid-filled sinus tract and loculated probable abscess extending over the dorsal surface of the fifth metatarsal measuring 5.4 x 0.9 by is a 3.4 cm. Incomplete mildly angulated fourth and fifth metatarsal shaft fractures with periosteal reaction Findings which could be suggestive of reactive marrow versus early osteomyelitis involving the base of the fourth and fifth metatarsals. Electronically  Signed   By: Jonna Clark M.D.   On: 10/25/2019 19:06   DG Chest Port 1 View  Result Date: 10/25/2019 CLINICAL DATA:  Shortness of breath. EXAM: PORTABLE CHEST 1 VIEW COMPARISON:  October 16, 2019. FINDINGS: The heart size and mediastinal contours are within normal limits. Hypoinflation of the lungs is noted with mild bibasilar subsegmental atelectasis. Endotracheal tube has been removed. Right-sided PICC line is unchanged in position. The visualized skeletal structures are unremarkable. IMPRESSION: Hypoinflation of the lungs with mild bibasilar subsegmental atelectasis. Electronically Signed   By: Lupita Raider M.D.   On: 10/25/2019 08:37   ECHO TEE  Result Date: 10/24/2019    TRANSESOPHOGEAL ECHO REPORT   Patient Name:   Micheal Bell Date of Exam: 10/24/2019 Medical Rec #:  [id number]      Height:       74.0 in Accession #:    [id number]     Weight:       247.4 lb Date of Birth:  04/15/1970      BSA:          2.379 m Patient Age:    49 years       BP:           120/80 mmHg Patient Gender: M              HR:           100 bpm. Exam Location:  Inpatient Procedure: Transesophageal Echo Indications:    Emobli/Bacteremia  History:        Patient has prior history of Echocardiogram examinations.  Sonographer:    Ross Ludwig RDCS (AE) Referring Phys: [phone] HAO MENG PROCEDURE: The transesophogeal probe was passed without difficulty through the esophogus of the patient. Sedation performed by different physician. The patient's vital signs; including heart rate, blood pressure, and oxygen saturation; remained stable throughout the procedure. The patient developed no complications during the procedure. IMPRESSIONS  1. Left ventricular ejection fraction, by estimation, is 50 to 55%. The left ventricle has low normal function. The left ventricle has no regional wall motion abnormalities.  2. Right ventricular systolic function is normal. The right ventricular size is normal.  3. No left atrial/left atrial appendage thrombus was detected.  4. The mitral valve is normal in structure. Trivial mitral valve regurgitation. No evidence of mitral stenosis.  5. The aortic valve is normal in structure. Aortic valve regurgitation is not visualized. No aortic stenosis is present.  6. The inferior vena cava is normal in size with greater than 50% respiratory variability, suggesting right atrial pressure of 3 mmHg. Conclusion(s)/Recommendation(s): Normal biventricular function without evidence of hemodynamically significant valvular heart disease. FINDINGS  Left Ventricle: Left ventricular ejection fraction, by estimation, is 50 to 55%. The left ventricle has low normal function. The left ventricle has no regional wall motion abnormalities. The left ventricular  internal cavity size was normal in size. There is no left ventricular hypertrophy. Right Ventricle: The right ventricular size is normal. No increase in right ventricular wall thickness. Right ventricular systolic function is normal. Left Atrium: Left atrial size was normal in size. No left atrial/left atrial appendage thrombus was detected. Right Atrium: Right atrial size was normal in size. Pericardium: There is no evidence of pericardial effusion. Mitral Valve: The mitral valve is normal in structure. Normal mobility of the mitral valve leaflets. Trivial mitral valve regurgitation. No evidence of mitral valve stenosis. There is no evidence of mitral valve vegetation. Tricuspid Valve: The tricuspid  valve is normal in structure. Tricuspid valve regurgitation is mild . No evidence of tricuspid stenosis. There is no evidence of tricuspid valve vegetation. Aortic Valve: The aortic valve is normal in structure. Aortic valve regurgitation is not visualized. No aortic stenosis is present. There is no evidence of aortic valve vegetation. Pulmonic Valve: The pulmonic valve was normal in structure. Pulmonic valve regurgitation is trivial. No evidence of pulmonic stenosis. Aorta: The aortic root is normal in size and structure. Venous: The inferior vena cava is normal in size with greater than 50% respiratory variability, suggesting right atrial pressure of 3 mmHg. IAS/Shunts: No atrial level shunt detected by color flow Doppler. [name] MD Electronically signed by [name] MD Signature Date/Time: 10/24/2019/3:28:45 PM    Final     Review of Systems  HENT: Negative for ear discharge, ear pain, hearing loss and tinnitus.   Eyes: Negative for photophobia and pain.  Respiratory: Negative for cough and shortness of breath.   Cardiovascular: Negative for chest pain.  Gastrointestinal: Negative for abdominal pain, nausea and vomiting.  Genitourinary: Negative for dysuria, flank pain, frequency and urgency.   Musculoskeletal: Positive for arthralgias (Left foot). Negative for back pain, myalgias and neck pain.  Neurological: Negative for dizziness and headaches.  Hematological: Does not bruise/bleed easily.  Psychiatric/Behavioral: The patient is not nervous/anxious.    Blood pressure 123/73, pulse 87, temperature 98.3 F (36.8 C), temperature source Axillary, resp. rate 16, height 6\' 2"  (1.88 m), weight 117.7 kg, SpO2 90 %. Physical Exam Constitutional:      General: He is not in acute distress.    Appearance: He is well-developed. He is not diaphoretic.  HENT:     Head: Normocephalic and atraumatic.  Eyes:     General: No scleral icterus.       Right eye: No discharge.        Left eye: No discharge.     Conjunctiva/sclera: Conjunctivae normal.  Cardiovascular:     Rate and Rhythm: Normal rate and regular rhythm.  Pulmonary:     Effort: Pulmonary effort is normal. No respiratory distress.  Musculoskeletal:     Cervical back: Normal range of motion.     Comments: LLE No traumatic wounds, ecchymosis, or rash  Small punctate ulceration lateral midfoot, edema proximal dorsal midfoot with mild erythema  No knee or ankle effusion  Knee stable to varus/ valgus and anterior/posterior stress  Sens DPN, SPN, TN intact  Motor EHL, ext, flex, evers 5/5  DP 1+, PT 1+, No significant edema  Skin:    General: Skin is warm and dry.  Neurological:     Mental Status: He is alert.  Psychiatric:        Behavior: Behavior normal.     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 [name] to evaluate. Multiple medical problems including HTN, T2DM, gout, IBS, fibromyalgia, obesity, and CHF -- per primary service    Freeman Caldron, PA-C Orthopedic Surgery [phone] 10/26/2019, 10:22 AM

## 2019-10-26 NOTE — Evaluation (Deleted)
Clinical/Bedside Swallow Evaluation Patient Details  Name: RAQUEL SAYRES MRN: [id number] Date of Birth: [birth year]  Today's Date: 10/26/2019 Time: SLP Start Time (ACUTE ONLY): 1213 SLP Stop Time (ACUTE ONLY): 1240 SLP Time Calculation (min) (ACUTE ONLY): 27 min  Past Medical History:  Past Medical History:  Diagnosis Date  . Arthritis   . Asthma   . CHF (congestive heart failure) (HCC)   . Diabetes mellitus without complication (HCC)   . Fibromyalgia   . Gout   . Hypertension   . IBS (irritable bowel syndrome)   . Vein disorder    Past Surgical History:  Past Surgical History:  Procedure Laterality Date  . FOOT SURGERY    . TEE WITHOUT CARDIOVERSION N/A 10/24/2019   Procedure: TRANSESOPHAGEAL ECHOCARDIOGRAM (TEE);  Surgeon: [name], MD;  [facility] ENDOSCOPY;  Service: Cardiovascular;  Laterality: N/A;   HPI:  49 yo male with hx of HTN, T2DM, gout, IBS, fibromyalgia, obesity who presented on 8/16 for COVID pneumonia; decompensated on 8/19-8/28 (self extubated 8/23 and reintubated same day). Hospitalization was complicated by MSSA and GBS bacteria and found to have systolic heart failure. CXR 9/1 Hypoinflation of the lungs with mild bibasilar subsegmental atelectasis.   Assessment / Plan / Recommendation Clinical Impression  Pt was alert and OX3 during SLE. Pt has been unemployed and is responsible for finances and medical management. Lives with mom. He acheived a score of 10/26 on the SLUMS, revealing several areas of impairement. Pt was attentive to the eval and demonstrated adequate focused and sustained attention. He demonstrated memory deficits with retrieval and recall of new information. All areas of awareness were observed to be impaired. The pt demonstrated great difficulty with executive function and processing of higher-level information. Auditory comprehension and verbal expression observed to be WNL. Speech intelligibility WNL. Recommend SLP f/u to address  cognitive impairments.  SLP Visit Diagnosis: Cognitive communication deficit (R41.841)    Aspiration Risk       Diet Recommendation          Other  Recommendations     Follow up Recommendations        Frequency and Duration min 2x/week          Prognosis        Swallow Study   General HPI: 49 yo male with hx of HTN, T2DM, gout, IBS, fibromyalgia, obesity who presented on 8/16 for COVID pneumonia; decompensated on 8/19-8/28 (self extubated 8/23 and reintubated same day). Hospitalization was complicated by MSSA and GBS bacteria and found to have systolic heart failure. CXR 9/1 Hypoinflation of the lungs with mild bibasilar subsegmental atelectasis.    Oral/Motor/Sensory Function Overall Oral Motor/Sensory Function: Within functional limits   Ice Chips     Thin Liquid      Nectar Thick     Honey Thick     Puree     Solid            Royetta Crochet 10/26/2019,2:54 PM

## 2019-10-26 NOTE — Progress Notes (Signed)
VASCULAR LAB    Bilateral lower extremity venous duplex completed.    Preliminary report:  See CV proc for preliminary results.   Messaged Dr. Thedore Mins with results.   Graeden Bitner, RVT 10/26/2019, 10:56 AM

## 2019-10-26 NOTE — Progress Notes (Signed)
VASCULAR LAB    Left lower extremity arterial duplex completed.    Preliminary report:  See CV proc for preliminary results.  Micheale Schlack, RVT 10/26/2019, 11:09 AM

## 2019-10-27 ENCOUNTER — Encounter (HOSPITAL_COMMUNITY)
Admission: EM | Disposition: A | Discharge status: Home Health | Payer: Self-pay | Source: Home / Self Care | Attending: Internal Medicine

## 2019-10-27 ENCOUNTER — Inpatient Hospital Stay (HOSPITAL_COMMUNITY): Payer: Medicaid Other

## 2019-10-27 DIAGNOSIS — R569 Unspecified convulsions: Secondary | ICD-10-CM

## 2019-10-27 LAB — COMPREHENSIVE METABOLIC PANEL
ALT: 17 U/L (ref 0–44)
AST: 28 U/L (ref 15–41)
Albumin: 2.2 g/dL — ABNORMAL LOW (ref 3.5–5.0)
Alkaline Phosphatase: 93 U/L (ref 38–126)
Anion gap: 8 (ref 5–15)
BUN: 11 mg/dL (ref 6–20)
CO2: 25 mmol/L (ref 22–32)
Calcium: 8.5 mg/dL — ABNORMAL LOW (ref 8.9–10.3)
Chloride: 102 mmol/L (ref 98–111)
Creatinine, Ser: 0.73 mg/dL (ref 0.61–1.24)
GFR calc Af Amer: >60 mL/min (ref >60)
GFR calc non Af Amer: >60 mL/min (ref >60)
Glucose, Bld: 134 mg/dL — ABNORMAL HIGH (ref 70–99)
Potassium: 3.5 mmol/L (ref 3.5–5.1)
Sodium: 135 mmol/L (ref 135–145)
Total Bilirubin: 1 mg/dL (ref 0.3–1.2)
Total Protein: 6 g/dL — ABNORMAL LOW (ref 6.5–8.1)

## 2019-10-27 LAB — CBC WITH DIFFERENTIAL/PLATELET
Abs Immature Granulocytes: 0.18 10*3/uL — ABNORMAL HIGH (ref 0.00–0.07)
Basophils Absolute: 0 10*3/uL (ref 0.0–0.1)
Basophils Relative: 0 %
Eosinophils Absolute: 0.1 10*3/uL (ref 0.0–0.5)
Eosinophils Relative: 1 %
HCT: 31.5 % — ABNORMAL LOW (ref 39.0–52.0)
Hemoglobin: 10.4 g/dL — ABNORMAL LOW (ref 13.0–17.0)
Immature Granulocytes: 0 %
Lymphocytes Relative: 16 %
Lymphs Abs: 2.2 10*3/uL (ref 0.7–4.0)
MCH: 30.5 pg (ref 26.0–34.0)
MCHC: 33 g/dL (ref 30.0–36.0)
MCV: 92.4 fL (ref 80.0–100.0)
Monocytes Absolute: 0.7 10*3/uL (ref 0.1–1.0)
Monocytes Relative: 5 %
Neutro Abs: 9.9 10*3/uL — ABNORMAL HIGH (ref 1.7–7.7)
Neutrophils Relative %: 77 %
Platelets: 339 10*3/uL (ref 150–400)
RBC: 3.41 MIL/uL — ABNORMAL LOW (ref 4.22–5.81)
RDW: 12.3 % (ref 11.5–15.5)
WBC: 13.1 10*3/uL — ABNORMAL HIGH (ref 4.0–10.5)
nRBC: 0 % (ref 0.0–0.2)

## 2019-10-27 LAB — GLUCOSE, CAPILLARY
Glucose-Capillary: 125 mg/dL — ABNORMAL HIGH (ref 70–99)
Glucose-Capillary: 161 mg/dL — ABNORMAL HIGH (ref 70–99)
Glucose-Capillary: 213 mg/dL — ABNORMAL HIGH (ref 70–99)
Glucose-Capillary: 229 mg/dL — ABNORMAL HIGH (ref 70–99)
Glucose-Capillary: 324 mg/dL — ABNORMAL HIGH (ref 70–99)

## 2019-10-27 LAB — PROCALCITONIN: Procalcitonin: 0.13 ng/mL

## 2019-10-27 LAB — MAGNESIUM: Magnesium: 1.7 mg/dL (ref 1.7–2.4)

## 2019-10-27 LAB — BRAIN NATRIURETIC PEPTIDE: B Natriuretic Peptide: 54.1 pg/mL (ref 0.0–100.0)

## 2019-10-27 LAB — TSH: TSH: 4.137 u[IU]/mL (ref 0.350–4.500)

## 2019-10-27 LAB — C-REACTIVE PROTEIN: CRP: 9.2 mg/dL — ABNORMAL HIGH (ref <1.0)

## 2019-10-27 LAB — D-DIMER, QUANTITATIVE: D-Dimer, Quant: 5.77 ug/mL-FEU — ABNORMAL HIGH (ref 0.00–0.50)

## 2019-10-27 SURGERY — AMPUTATION, FOOT, RAY
Anesthesia: Choice | Laterality: Left

## 2019-10-27 MED ORDER — ENOXAPARIN SODIUM 120 MG/0.8ML ~~LOC~~ SOLN
120.0000 mg | Freq: Two times a day (BID) | SUBCUTANEOUS | Status: DC
  Administered 2019-10-27 – 2019-11-02 (×13): 120 mg via SUBCUTANEOUS
  Filled 2019-10-27 (×14): qty 0.8

## 2019-10-27 MED ORDER — CYCLOBENZAPRINE HCL 5 MG PO TABS
5.0000 mg | ORAL_TABLET | Freq: Three times a day (TID) | ORAL | Status: DC
  Administered 2019-10-27 (×3): 5 mg via ORAL
  Filled 2019-10-27 (×3): qty 1

## 2019-10-27 MED ORDER — SODIUM CHLORIDE 0.9 % IV SOLN
2000.0000 mg | Freq: Once | INTRAVENOUS | Status: AC
  Administered 2019-10-27: 2000 mg via INTRAVENOUS
  Filled 2019-10-27: qty 20

## 2019-10-27 NOTE — Progress Notes (Signed)
NEUROLOGY CONSULTATION PROGRESS NOTE   Date of service: October 27, 2019 Patient Name: Micheal Bell MRN:  [id number] DOB:  January 21, 1971  Interval Hx  Notified by team of somnolence today. He is somnolent, but awakens to follow commands and then goes back to sleep.  Physical Exam    Neurologic Examination  Mental status/Cognition: Somnolent, opens eyes to voice, maintains eye contact throughout, oriented to self, place, month and year. Speech/language: Fluent, comprehension intact, object naming intact, repetition intact. Cranial nerves:   CN II Pupils equal and reactive to light, no VF deficits    CN III,IV,VI EOM intact, no gaze preference or deviation, no nystagmus    CN V normal sensation in V1, V2, and V3 segments bilaterally    CN VII no asymmetry, no nasolabial fold flattening    CN VIII normal hearing to speech    CN IX & X normal palatal elevation, no uvular deviation    CN XI 5/5 head turn and 5/5 shoulder shrug bilaterally    CN XII midline tongue protrusion    Motor:  Muscle bulk: normal, tone normal  Spontaneous and antigravity movement noted in BL upper and LLE.  No antigravity movement in RLE. Able to roll RLE on the bed horizontally.  Sensation:  Light touch intact   Pin prick    Temperature    Vibration   Proprioception     Labs   Lab Results  Component Value Date   NA 135 10/27/2019   K 3.5 10/27/2019   CL 102 10/27/2019   CO2 25 10/27/2019   GLUCOSE 134 (H) 10/27/2019   BUN 11 10/27/2019   CREATININE 0.73 10/27/2019   CALCIUM 8.5 (L) 10/27/2019   ALBUMIN 2.2 (L) 10/27/2019   AST 28 10/27/2019   ALT 17 10/27/2019   ALKPHOS 93 10/27/2019   BILITOT 1.0 10/27/2019   GFRNONAA >60 10/27/2019   GFRAA >60 10/27/2019     Imaging and Diagnostic studies  MRI Brain without contrast: Examination limited by poor signal on several sequences as well as motion degradation, as described. 17 mm focus of restricted diffusion and T2/FLAIR  hyperintensity within the subcortical left frontoparietal lobes. This finding is nonspecific, but given the patient's history, the primary differential considerations are acute/early subacute infarct versus cerebritis. Consider contrast-enhanced MR imaging of the brain for further evaluation of this lesion. Mild chronic small vessel ischemic disease. Paranasal sinus disease with air-fluid levels. Bilateral mastoid effusions.  CEEG: This study showed evidence of potential epileptogenicity arising from bilateral frontocentral, maximal vertex region. The sharp waves are at times rhythmic consistent with brief ictal-interictal rhythmic discharges. Additionally, there is evidence of mild diffuse encephalopathy, nonspecific etiology.  Impression   Micheal Bell is a 49 y.o. male with PMH significant for HTN, DM2, gout, IBS, firbomyalgia, obesity who is admitted with COVID Pneumonia c/b MSSA and GBS Bacteremia with negative TEE. He was found to have L frontoparietal lesion on MRI consistent with Cerebritis. Exam with RLE weakness likely due to the noted lesion.  He was noted to be somnolent today and therefore I ordered Keppra 2G IV once and start him on cEEG. He does have evidence of epileptogenicity on the cEEG. He did not have any distinct episodes that he reported or were witnesed by staff and concerning for seizures.  Impression: - L frontoparietal Cerebritis  Recommendations  - cEEG for 24 hours. - will decide on maintenance Keppra based on cEEG. ______________________________________________________________________   Thank you for the opportunity to take part in  the care of this patient. If you have any further questions, please contact the neurology consultation attending.  Signed,  [place] Pager Number [phone]

## 2019-10-27 NOTE — Progress Notes (Signed)
vLTM EEG started following spot EEG. Notified neuro 

## 2019-10-27 NOTE — Progress Notes (Signed)
ANTICOAGULATION CONSULT NOTE - Follow Up Consult  Pharmacy Consult for Lovenox Indication: DVT, RLE  Allergies  Allergen Reactions  . Azithromycin   . Lodine [Etodolac] Anaphylaxis    Patient Measurements: Height: 6\' 2"  (188 cm) Weight: 117.1 kg (258 lb 2.5 oz) IBW/kg (Calculated) : 82.2 Lovenox Dosing Weight: 117.1 kg  Vital Signs: Temp: 98.5 F (36.9 C) (09/03 0900) Temp Source: Oral (09/03 0900) BP: 134/74 (09/03 0751) Pulse Rate: 76 (09/03 0751)  Labs: Recent Labs    10/25/19 0352 10/25/19 0352 10/25/19 1600 10/26/19 0402 10/27/19 0500  HGB 13.2   < >  --  11.2* 10.4*  HCT 40.1  --   --  34.3* 31.5*  PLT 506*  --   --  396 339  LABPROT  --   --  14.7  --   --   INR  --   --  1.2  --   --   CREATININE 0.69  --   --  0.75 0.73   < > = values in this interval not displayed.    Estimated Creatinine Clearance: 148.6 mL/min (by C-G formula based on SCr of 0.73 mg/dL).  Assessment:  49 yr old male on Lovenox 60 mg (0.5 mg/kg) SQ q24h for VTE prophylaxis. Duplex negative for DVT on 8/26 but repeated due to rising d-dimer and now positive for RLE DVT. R leg arterial 9/26 did not show arterial occlusive disease.   Last Lovenox 60 mg dose ~2pm on 9/2.  Goal of Therapy:  Anti-Xa level 0.6-1 units/ml 4hrs after LMWH dose given Monitor platelets by anticoagulation protocol: Yes   Plan:   Increase Lovenox to 120 mg SQ q12hr.  Follow CBC; monitor for s/sx bleeding.  Follow up for transition to oral AC when able.  11/2, RPh Phone: [phone] 10/27/2019,12:11 PM

## 2019-10-27 NOTE — Progress Notes (Addendum)
    [facility]   Reason for visit: Follow up on osteomyelitis, cerebritis  Interval History: no surgery planned by Dr. [name] at this time.    Physical Exam: Constitutional:  Vitals:   10/27/19 0900 10/27/19 1211  BP:  115/67  Pulse:  92  Resp:  20  Temp: 98.5 F (36.9 C)   SpO2:  99%    Impression: osteomyelitis and cerebritis.    Plan: 1. Nafcillin IV through 9/29 and if there is still infection of the foot, can consider oral Keflex at that time as continuation.    Appears likely going to CIR but will do OPAT if not.   Will continue to follow intermittently.

## 2019-10-27 NOTE — Procedures (Addendum)
Patient Name: Micheal Bell  MRN: [id number]  Epilepsy Attending: Charlsie Quest  Referring Physician/Provider: Dr. [name] Date: 10/27/2019 Duration: 22.22 minutes  Patient history: 49 year old male with altered mental status. EEG evaluate for seizures.  Level of alertness: Awake, asleep  AEDs during EEG study: Keppra  Technical aspects: This EEG study was done with scalp electrodes positioned according to the 10-20 International system of electrode placement. Electrical activity was acquired at a sampling rate of 500Hz  and reviewed with a high frequency filter of 70Hz  and a low frequency filter of 1Hz . EEG data were recorded continuously and digitally stored.   Description: The posterior dominant rhythm consists of 8 Hz activity of moderate voltage (25-35 uV) seen predominantly in posterior head regions, symmetric and reactive to eye opening and eye closing. Sleep was characterized by vertex waves, sleep spindles (12 to 14 Hz), maximal frontocentral region. Frequent runs of sharp waves were seen in frontocentral region, maximal vertex lasting about 5 to 6 seconds consistent with brief ictal-interictal rhythmic discharges. Continuous generalized 5 to 6 Hz theta slowing was also noted. Hyperventilation and photic stimulation were not performed.     ABNORMALITY -Continuous slow, generalized -Brief ictal-interictal rhythmic discharges, bilateral frontocentral region, maximal vertex  IMPRESSION: This study showed evidence of potential epileptogenicity arising from bilateral frontocentral, maximal vertex region. The sharp waves are at times rhythmic consistent with brief ictal-interictal rhythmic discharges. Additionally, there is evidence of mild diffuse encephalopathy, nonspecific etiology.  [name] 

## 2019-10-27 NOTE — Progress Notes (Signed)
EEG completed, results pending. 

## 2019-10-27 NOTE — Progress Notes (Addendum)
PROGRESS NOTE                                                                                                                                                                                                             Patient Demographics:    Micheal Bell, is a 49 y.o. male, DOB - January 29, 1971, TWS:[id number]  Outpatient Primary MD for the patient is [name], MD    LOS - 15  Admit date - 10/09/2019    Chief Complaint  Patient presents with  . Cough       Brief Narrative - 49 yo male with hx of HTN, T2DM, gout, IBS, fibromyalgia, obesity who presented on 8/16 for COVID pneumonia.  He decompensated on 8/19 and was intubated.  Self extubated on 8/23 and was reintubated.  He's subsequently been extubated on 8/28.  He's been on typical therapies for covid including steroids, baricitinib and remdesivir.  His hospitalization was complicated by MSSA and GBS bacteria.  Also found to have systolic heart failure.  Transferred to myservice on 10/25/19   Subjective:   Patient in bed, appears comfortable, denies any headache, no fever, no chest pain or pressure, no shortness of breath , no abdominal pain. No focal weakness.  Remains extremely unreliable historian due to his underlying encephalopathy.   Assessment  & Plan :     1. Acute Hypoxic Resp. Failure due to Acute Covid 19 Viral Pneumonitis during the ongoing 2020 Covid 19 Pandemic - he had severe hypoxia and parenchymal lung injury he was intubated and admitted by ICU, he was started on IV steroids, remdesivir and Baricitinib on 10/12/2019.  He was subsequently extubated on 10/21/2019.  His stay was complicated by bacteremia after which Baricitinib was discontinued on 10/24/2019.  His pulmonary status is gradually improving.  Encouraged the patient to sit up in chair in the daytime use I-S and flutter valve for pulmonary toiletry and then prone in bed when at night.  Will advance  activity and titrate down oxygen as possible.   SpO2: 99 % O2 Flow Rate (L/min): 2 L/min FiO2 (%): 97 %  Recent Labs  Lab 10/21/19 1517 10/22/19 0450 10/23/19 0352 10/23/19 1058 10/23/19 1119 10/23/19 1206 10/23/19 1659 10/24/19 0130 10/25/19 0352 10/25/19 0353 10/25/19 1600 10/26/19 0402 10/27/19 0500  WBC  --    < > 18.7*  --   --   --   --  18.1* 15.5*  --   --  14.3* 13.1*  PLT  --    < > 523*  --   --   --   --  554* 506*  --   --  396 339  CRP  --   --   --   --   --   --   --  5.1* 3.7*  --   --  5.4* 9.2*  BNP  --   --   --   --   --   --   --   --   --  34.4  --  38.0 54.1  DDIMER  --   --   --  5.19*  --   --   --  3.55* 5.12*  --   --  6.29* 5.77*  PROCALCITON  --   --   --   --  <0.10  --   --  <0.10  --   --  <0.10 <0.10 0.13  AST  --   --   --   --   --   --   --  34 30  --   --  23 28  ALT  --   --   --   --   --   --   --  31 25  --   --  16 17  ALKPHOS  --   --   --   --   --   --   --  122 105  --   --  89 93  BILITOT  --   --   --   --   --   --   --  0.3 0.4  --   --  1.2 1.0  ALBUMIN  --   --   --   --   --   --   --  2.3* 2.5*  --   --  2.3* 2.2*  INR  --   --   --   --   --   --   --   --   --   --  1.2  --   --   LATICACIDVEN 1.1  --   --   --   --  2.3* 1.1  --   --   --   --   --   --    < > = values in this interval not displayed.    2.  Sepsis with MSSA and Group B strep bacteremia.  ID following, was on IV Ancef but switch to nafcillin on 10/26/2019.  Clean TEE, history of Charcot joints, peripheral neuropathy and recent left foot soft tissue injury, MRI left foot shows an abscess, Ortho has been consulted and they recommended medical management with outpatient follow-up with Dr. Sharol Given post discharge, also has osteomyelitis, defer duration of treatment with antibiotics to ID.  3.  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  Does have right-sided weakness and also some left arm weakness also family  with me that he has bilateral rotator cuff injury.  For now we will place him on statin for secondary prevention, he has anaphylactic allergy to aspirin related products, PT OT and speech eval.  Monitor.  MRI of the brain shows changes suggestive of cerebritis, neurology and ID on board.  Continue nafcillin, get EEG and monitor.  3.  Possible systolic heart failure as shown by TTE however  this could have been transient due to sepsis, on repeat TEE his EF is now normalized to 50 to 55%.  Supportive care.  Cardiology on board currently blood pressure soft and he is tachycardic, likely dehydrated due to poor oral intake, hold ARB, increase beta-blocker dose, hydrate with IV fluids and monitor.   4.  Cyanotic right lower extremity toes.  Likely due to hypoperfusion which is transient, arterial ultrasound stable.  5.  History of chronic pain.  On multiple narcotics, Flexeril (low dose to prevent withdrawal) and high-dose benzodiazepines.  Hold all due to metabolic encephalopathy which is severe.  6. HTN -stable on combination of ARB and beta-blocker.   7.  Elevated D-dimer, positive acute DVT in the right lower extremity.  Switch to full dose Lovenox.   8.  DM type II.  Currently on Lantus and sliding scale monitor.  Lab Results  Component Value Date   HGBA1C 11.3 (H) 10/26/2019   CBG (last 3)  Recent Labs    10/26/19 2350 10/27/19 0404 10/27/19 0910  GLUCAP 209* 161* 125*      Condition - Extremely Guarded  Family Communication  :  Sister and mother [phone] - 10/25/19, 10/27/19  Code Status :  Full  Consults  :  PCCM, ID, Cards  Procedures  :    EEG -   Bilateral lower extremity venous ultrasound - Acute right lower extremity popliteal vein DVT.  Carotid duplex.  Nonacute.    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  CT Head -  1. No acute intracranial abnormality. 2. Area of  subcortical low-density involving the posterior left frontal lobe at the convexity, likely encephalomalacia and sequela of remote infarct. There are no prior exams for comparison to establish chronicity. MRI could be considered for further evaluation based on clinical concern. 3. Paranasal sinus fluid levels on opacification of the right greater than left mastoid air cells, possibly related to intubation.  R. Leg Arterial US -  No evidence of arterial occlusive disease. Normal waveforms, no stenosis  TTE -  1. Extremely poor acoustic windows. LVEF appears to be depressed with diffuse hypokinesis, worse in the mid/distal inferior/inferoseptal walls. . Left ventricular ejection fraction, by estimation, is 30 to 35%. The left ventricle has moderately decreased function. The left ventricular internal cavity size was mildly dilated. Left ventricular diastolic parameters are indeterminate.  2. Right ventricular systolic function is moderately reduced. The right ventricular size is mildly enlarged.  3. The mitral valve is normal in structure. Trivial mitral valve regurgitation.  4. The aortic valve is normal in structure. Aortic valve regurgitation is not visualized  TEE - 1. Left ventricular ejection fraction, by estimation, is 50 to 55%. The left ventricle has low normal function. The left ventricle has no regional wall motion abnormalities.  2. Right ventricular systolic function is normal. The right ventricular size is normal.  3. No left atrial/left atrial appendage thrombus was detected.  4. The mitral valve is normal in structure. Trivial mitral valve regurgitation. No evidence of mitral stenosis.  5. The aortic valve is normal in structure. Aortic valve regurgitation is not visualized. No aortic stenosis is present.  6. The inferior vena cava is normal in size with greater than 50% respiratory variability, suggesting right atrial pressure of 3 mmHg.  CT - 1. No acute intracranial abnormality. 2. Area of  subcortical low-density involving the posterior left frontal lobe at the convexity,  likely encephalomalacia and sequela of remote infarct. There are no prior exams for comparison to establish chronicity. MRI could be considered for further evaluation based on clinical concern. 3. Paranasal sinus fluid levels on opacification of the right greater than left mastoid air cells, possibly related to intubation    PUD Prophylaxis : PPI  Disposition Plan  :    Status is: Inpatient  Remains inpatient appropriate because:IV treatments appropriate due to intensity of illness or inability to take PO   Dispo: The patient is from: Home              Anticipated d/c is to: Home              Anticipated d/c date is: 3 days              Patient currently is not medically stable to d/c.   DVT Prophylaxis  :  Lovenox    Lab Results  Component Value Date   PLT 339 10/27/2019    Diet :  Diet Order            Diet heart healthy/carb modified Room service appropriate? No; Fluid consistency: Thin  Diet effective now                  Inpatient Medications  Scheduled Meds: . (feeding supplement) PROSource Plus  30 mL Oral BID BM  . atorvastatin  40 mg Oral Daily  . buPROPion  150 mg Oral Daily  . chlorhexidine gluconate (MEDLINE KIT)  15 mL Mouth Rinse BID  . Chlorhexidine Gluconate Cloth  6 each Topical Daily  . docusate sodium  100 mg Oral BID  . enoxaparin (LOVENOX) injection  60 mg Subcutaneous Daily  . feeding supplement (ENSURE ENLIVE)  237 mL Oral TID BM  . insulin aspart  0-15 Units Subcutaneous Q4H  . insulin glargine  20 Units Subcutaneous BID  . linagliptin  5 mg Oral Daily  . metoprolol tartrate  50 mg Oral BID  . pantoprazole  40 mg Oral Daily  . polyethylene glycol  17 g Oral Daily  . predniSONE  10 mg Oral Q breakfast  . senna  2 tablet Oral BID  . sodium chloride flush  10-40 mL Intracatheter Q12H   Continuous Infusions: . sodium chloride Stopped (10/12/19 1606)  .  levETIRAcetam    . nafcillin IV 20.8 mL/hr at 10/26/19 1600   PRN Meds:.sodium chloride, bisacodyl, ipratropium-albuterol  Antibiotics  :    Anti-infectives (From admission, onward)   Start     Dose/Rate Route Frequency Ordered Stop   10/26/19 1415  nafcillin 12 g in dextrose 5 % 500 mL IVPB        12 g 20.8 mL/hr over 24 Hours Intravenous Every 24 hours 10/26/19 1414     10/26/19 1400  nafcillin injection 12 g  Status:  Discontinued        12 g Intravenous Daily 10/26/19 1238 10/26/19 1242   10/26/19 1400  nafcillin 12 g in dextrose 5 % 50 mL IVPB  Status:  Discontinued        12 g 100 mL/hr over 30 Minutes Intravenous Every 24 hours 10/26/19 1242 10/26/19 1339   10/26/19 1400  nafcillin 12 g in dextrose 5 % 500 mL IVPB  Status:  Discontinued        12 g 20.8 mL/hr over 24 Hours Intravenous Every 24 hours 10/26/19 1337 10/26/19 1416   10/26/19 1200  nafcillin 2 g in sodium chloride 0.9 %  100 mL IVPB  Status:  Discontinued        2 g 200 mL/hr over 30 Minutes Intravenous Every 4 hours 10/26/19 1010 10/26/19 1238   10/12/19 2200  cefTRIAXone (ROCEPHIN) 2 g in sodium chloride 0.9 % 100 mL IVPB  Status:  Discontinued        2 g 200 mL/hr over 30 Minutes Intravenous Every 24 hours 10/12/19 0441 10/12/19 0921   10/12/19 0930  ceFAZolin (ANCEF) IVPB 2g/100 mL premix  Status:  Discontinued        2 g 200 mL/hr over 30 Minutes Intravenous Every 8 hours 10/12/19 0921 10/26/19 1010   10/11/19 1000  remdesivir 100 mg in sodium chloride 0.9 % 100 mL IVPB       "Followed by" Linked Group Details   100 mg 200 mL/hr over 30 Minutes Intravenous Daily 10/10/19 0050 10/14/19 0956   10/11/19 0800  vancomycin (VANCOCIN) IVPB 1000 mg/200 mL premix  Status:  Discontinued        1,000 mg 200 mL/hr over 60 Minutes Intravenous Every 8 hours 10/10/19 2239 10/12/19 0921   10/10/19 2230  vancomycin (VANCOCIN) IVPB 1000 mg/200 mL premix        1,000 mg 200 mL/hr over 60 Minutes Intravenous Every 1 hr x 2  10/10/19 2208 10/11/19 0052   10/10/19 2215  cefTRIAXone (ROCEPHIN) 2 g in sodium chloride 0.9 % 100 mL IVPB  Status:  Discontinued        2 g 200 mL/hr over 30 Minutes Intravenous Every 24 hours 10/10/19 2205 10/12/19 0441   10/10/19 0100  remdesivir 100 mg in sodium chloride 0.9 % 100 mL IVPB       "Followed by" Linked Group Details   100 mg 200 mL/hr over 30 Minutes Intravenous Every 30 min 10/10/19 0050 10/10/19 0332       Time Spent in minutes  30   Lala Lund M.D on 10/27/2019 at 11:11 AM  To page go to [email or web address] - password [facility]  [facility] -  Office  [phone]   See all Orders from today for further details    Objective:   Vitals:   10/26/19 2247 10/27/19 0402 10/27/19 0751 10/27/19 0900  BP: 104/61 137/81 134/74   Pulse: 90 75 76   Resp: 17 20 20    Temp: 99.2 F (37.3 C) 98.4 F (36.9 C) 98.7 F (37.1 C) 98.5 F (36.9 C)  TempSrc: Oral Axillary Oral Oral  SpO2: 92% 90% 99%   Weight:  117.1 kg    Height:        Wt Readings from Last 3 Encounters:  10/27/19 117.1 kg  09/11/19 111.1 kg  05/12/17 (!) 145.2 kg     Intake/Output Summary (Last 24 hours) at 10/27/2019 1111 Last data filed at 10/27/2019 0300 Gross per 24 hour  Intake 642.1 ml  Output 750 ml  Net -107.9 ml     Physical Exam  Awake but confused, right-sided weakness, deltoid weakness in both extremities,  [place].AT,PERRAL Supple Neck,No JVD, No cervical lymphadenopathy appriciated.  Symmetrical Chest wall movement, Good air movement bilaterally, CTAB RRR,No Gallops, Rubs or new Murmurs, No Parasternal Heave +ve B.Sounds, Abd Soft, No tenderness, No organomegaly appriciated, No rebound - guarding or rigidity. Mild cyanosis of the right toes, both ankles under bandage,    Data Review:    CBC Recent Labs  Lab 10/23/19 0352 10/24/19 0130 10/25/19 0352 10/26/19 0402 10/27/19 0500  WBC 18.7* 18.1* 15.5* 14.3* 13.1*  HGB 13.0 12.3*  13.2 11.2* 10.4*  HCT 40.9 39.2 40.1  34.3* 31.5*  PLT 523* 554* 506* 396 339  MCV 93.8 94.9 94.1 93.7 92.4  MCH 29.8 29.8 31.0 30.6 30.5  MCHC 31.8 31.4 32.9 32.7 33.0  RDW 11.9 12.2 12.4 12.3 12.3  LYMPHSABS  --  3.8 2.4 2.4 2.2  MONOABS  --  1.1* 0.8 0.8 0.7  EOSABS  --  0.1 0.1 0.1 0.1  BASOSABS  --  0.1 0.1 0.1 0.0    Recent Labs  Lab 10/21/19 1517 10/22/19 0450 10/23/19 0352 10/23/19 1058 10/23/19 1119 10/23/19 1206 10/23/19 1659 10/24/19 0130 10/25/19 0352 10/25/19 0353 10/25/19 1600 10/26/19 0402 10/26/19 0439 10/27/19 0500  NA  --    < > 139  --   --   --   --  143 141  --   --  134*  --  135  K  --    < > 3.9  --   --   --   --  3.5 3.3*  --   --  3.5  --  3.5  CL  --    < > 99  --   --   --   --  102 102  --   --  101  --  102  CO2  --    < > 28  --   --   --   --  29 27  --   --  24  --  25  GLUCOSE  --    < > 217*  --   --   --   --  82 100*  --   --  111*  --  134*  BUN  --    < > 21*  --   --   --   --  22* 17  --   --  12  --  11  CREATININE  --    < > 0.66  --   --   --   --  0.76 0.69  --   --  0.75  --  0.73  CALCIUM  --    < > 9.0  --   --   --   --  9.2 9.1  --   --  8.6*  --  8.5*  AST  --   --   --   --   --   --   --  34 30  --   --  23  --  28  ALT  --   --   --   --   --   --   --  31 25  --   --  16  --  17  ALKPHOS  --   --   --   --   --   --   --  122 105  --   --  89  --  93  BILITOT  --   --   --   --   --   --   --  0.3 0.4  --   --  1.2  --  1.0  ALBUMIN  --   --   --   --   --   --   --  2.3* 2.5*  --   --  2.3*  --  2.2*  MG  --   --   --   --   --   --   --  2.0 1.7  --   --  1.5*  --  1.7  CRP  --   --   --   --   --   --   --  5.1* 3.7*  --   --  5.4*  --  9.2*  DDIMER  --   --   --  5.19*  --   --   --  3.55* 5.12*  --   --  6.29*  --  5.77*  PROCALCITON  --   --   --   --  <0.10  --   --  <0.10  --   --  <0.10 <0.10  --  0.13  LATICACIDVEN 1.1  --   --   --   --  2.3* 1.1  --   --   --   --   --   --   --   INR  --   --   --   --   --   --   --   --   --   --  1.2  --    --   --   TSH  --   --   --   --   --  1.422  --   --   --   --   --   --  4.137  --   HGBA1C  --   --   --   --   --   --   --   --   --   --   --  11.3*  --   --   AMMONIA  --   --   --   --   --  22  --   --   --   --   --   --   --   --   BNP  --   --   --   --   --   --   --   --   --  34.4  --  38.0  --  54.1   < > = values in this interval not displayed.    ------------------------------------------------------------------------------------------------------------------ Recent Labs    10/26/19 0402  CHOL 168  HDL 43  LDLCALC 99  TRIG 130  CHOLHDL 3.9    Lab Results  Component Value Date   HGBA1C 11.3 (H) 10/26/2019   ------------------------------------------------------------------------------------------------------------------ Recent Labs    10/26/19 0439  TSH 4.137    Cardiac Enzymes No results for input(s): CKMB, TROPONINI, MYOGLOBIN in the last 168 hours.  Invalid input(s): CK ------------------------------------------------------------------------------------------------------------------    Component Value Date/Time   BNP 54.1 10/27/2019 0500    Micro Results Recent Results (from the past 240 hour(s))  Culture, blood (routine x 2)     Status: None (Preliminary result)   Collection Time: 10/23/19 10:58 AM   Specimen: BLOOD  Result Value Ref Range Status   Specimen Description BLOOD LEFT ANTECUBITAL  Final   Special Requests   Final    BOTTLES DRAWN AEROBIC ONLY Blood Culture adequate volume   Culture   Final    NO GROWTH 4 DAYS Performed at [facility], [address], [place], [place] [address]    Report Status PENDING  Incomplete  Culture, blood (routine x 2)     Status: None (Preliminary result)   Collection Time: 10/23/19 11:00 AM   Specimen: BLOOD LEFT HAND  Result Value Ref Range Status   Specimen Description  BLOOD LEFT HAND  Final   Special Requests   Final    BOTTLES DRAWN AEROBIC ONLY Blood Culture adequate volume   Culture    Final    NO GROWTH 4 DAYS Performed at [facility], [address], [place], [place] [address]    Report Status PENDING  Incomplete    Radiology Reports CT HEAD WO CONTRAST  Result Date: 10/19/2019 CLINICAL DATA:  Deliriums.  Mental status change. EXAM: CT HEAD WITHOUT CONTRAST TECHNIQUE: Contiguous axial images were obtained from the base of the skull through the vertex without intravenous contrast. COMPARISON:  None. FINDINGS: Brain: Area subcortical low-density involving the posterior left frontal lobe at the convexity, series 3, image 25 and series 5, image 49, likely encephalomalacia and sequela of remote infarct, however nonspecific. No hemorrhage. No evidence of acute ischemia. No hydrocephalus, midline shift or mass effect. No subdural or extra-axial collection. Vascular: Atherosclerosis of skullbase vasculature without hyperdense vessel or abnormal calcification. Skull: No fracture or focal lesion. Sinuses/Orbits: Nasogastric tube in place. Fluid levels in the sphenoid sinuses and right maxillary sinus with scattered opacification of ethmoid air cells may be related to intubation. Opacification of the right greater than left mastoid air cells. Other: None. IMPRESSION: 1. No acute intracranial abnormality. 2. Area of subcortical low-density involving the posterior left frontal lobe at the convexity, likely encephalomalacia and sequela of remote infarct. There are no prior exams for comparison to establish chronicity. MRI could be considered for further evaluation based on clinical concern. 3. Paranasal sinus fluid levels on opacification of the right greater than left mastoid air cells, possibly related to intubation. Electronically Signed   By: Keith Rake M.D.   On: 10/19/2019 15:30   CT ANGIO CHEST PE W OR WO CONTRAST  Result Date: 10/23/2019 CLINICAL DATA:  COVID pneumonia, acute hypoxic respiratory failure, positive D-dimer EXAM: CT ANGIOGRAPHY CHEST WITH CONTRAST TECHNIQUE:  Multidetector CT imaging of the chest was performed using the standard protocol during bolus administration of intravenous contrast. Multiplanar CT image reconstructions and MIPs were obtained to evaluate the vascular anatomy. CONTRAST:  46m OMNIPAQUE IOHEXOL 350 MG/ML SOLN COMPARISON:  None. FINDINGS: Cardiovascular: Satisfactory opacification of the pulmonary arteries to the segmental level. No evidence of pulmonary embolism. The central pulmonary arteries are of normal caliber. There is moderate coronary artery calcification. Normal heart size. No pericardial effusion. The thoracic aorta is unremarkable save for bovine arch anatomy. Right upper extremity PICC line tip is seen within the superior vena cava. Mediastinum/Nodes: No enlarged mediastinal, hilar, or axillary lymph nodes. Thyroid gland, trachea, and esophagus demonstrate no significant findings. Nasoenteric feeding tube extends into the upper abdomen beyond the margin of the examination. Lungs/Pleura: Lung volumes are small. Evaluation is limited by motion artifact, however, multifocal pulmonary infiltrates are identified demonstrating a peripheral and basal predominance, likely infectious or inflammatory in the acute setting. No central obstructing lesion. No pneumothorax or pleural effusion. No superimposed interstitial edema. Upper Abdomen: No acute abnormality. Musculoskeletal: No chest wall abnormality. No acute or significant osseous findings. Review of the MIP images confirms the above findings. IMPRESSION: 1. No evidence of pulmonary embolism. 2. Multifocal pulmonary infiltrates are identified demonstrating a peripheral and basal predominance, likely infectious or inflammatory in the acute setting. 3. Moderate coronary artery calcification. Electronically Signed   By: AFidela SalisburyMD   On: 10/23/2019 16:25   MR BRAIN WO CONTRAST  Result Date: 10/25/2019 CLINICAL DATA:  Neuro deficit, acute, stroke suspected. Additional history provided:  COVID positive.  Additional history obtained from [place] with MSSA and group B strep bacteremia. EXAM: MRI HEAD WITHOUT CONTRAST TECHNIQUE: Multiplanar, multiecho pulse sequences of the brain and surrounding structures were obtained without intravenous contrast. COMPARISON:  Head CT 10/19/2019. FINDINGS: Brain: The examination is limited by poor signal on several sequences as well as motion degradation. Most notably, there is moderate motion degradation of the axial T2 weighted sequence, severe motion degradation of the axial SWI sequence and moderate motion degradation of the coronal T2 weighted sequence. Cerebral volume is normal for age. There is a 17 mm focus of restricted diffusion and corresponding T2/FLAIR hyperintensity within the subcortical left frontoparietal lobes (for instance as seen on series 2, image 39) (series 3, image 17). Additional mild scattered T2/FLAIR hyperintensity within the cerebral white matter and pons is nonspecific, but consistent with chronic small vessel ischemic disease. Severe motion degradation of the axial SWI sequence precludes adequate evaluation for intracranial blood products. No extra-axial fluid collection. No midline shift. Vascular: Expected proximal arterial flow voids. Skull and upper cervical spine: No focal marrow lesion is identified within described limitations. Sinuses/Orbits: Visualized orbits show no acute finding. Air-fluid levels within the sphenoid and right maxillary sinuses. Partial opacification of right ethmoid air cells. Bilateral mastoid effusions. These results will be called to the ordering clinician or representative by the Radiologist Assistant, and communication documented in the PACS or [employer]. IMPRESSION: Examination limited by poor signal on several sequences as well as motion degradation, as described. 17 mm focus of restricted diffusion and T2/FLAIR hyperintensity within the subcortical left frontoparietal  lobes. This finding is nonspecific, but given the patient's history, the primary differential considerations are acute/early subacute infarct versus cerebritis. Consider contrast-enhanced MR imaging of the brain for further evaluation of this lesion. Mild chronic small vessel ischemic disease. Paranasal sinus disease with air-fluid levels. Bilateral mastoid effusions. Electronically Signed   By: Kellie Simmering DO   On: 10/25/2019 15:16   MR BRAIN W CONTRAST  Result Date: 10/25/2019 CLINICAL DATA:  Brain mass follow-up EXAM: MRI HEAD WITH CONTRAST TECHNIQUE: Multiplanar, multiecho pulse sequences of the brain and surrounding structures were obtained with intravenous contrast. CONTRAST:  72m GADAVIST GADOBUTROL 1 MMOL/ML IV SOLN COMPARISON:  Brain MRI without contrast 10/25/2019 FINDINGS: Brain: There is a peripherally enhancing lesion at the base of the left precentral gyrus that measures 0.9 x 0.7 x 1.8 cm. There are no other areas of abnormal contrast enhancement. 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 Electronically Signed   By: KUlyses JarredM.D.   On: 10/25/2019 22:47   MR FOOT LEFT WO CONTRAST  Result Date: 10/25/2019 CLINICAL DATA:  Bacteremia left foot pain and diabetic EXAM: MRI OF THE LEFT FOOT WITHOUT CONTRAST TECHNIQUE: Multiplanar, multisequence MR imaging of the left was performed. No intravenous contrast was administered. COMPARISON:  October 23, 2019 FINDINGS: Bones/Joint/Cartilage Mildly angulated nondisplaced fractures of the midshaft of the fourth and fifth metatarsals are noted. There is periosteal reaction seen along the medial margins of the metatarsal shafts. Increased heterogeneous T2 hyperintense signal with subtle T1 hypointensity seen at the base of the fourth and fifth metatarsals. No other osseous marrow signal abnormality is seen. There is no large knee joint effusions noted. The  articular surfaces appear to be maintained. Ligaments The Lisfranc ligament is intact. Muscles and Tendons Increased feathery signal with atrophy is seen throughout the muscles of the forefoot. There is a  small amount of fluid seen surrounding the posterior tibialis tendon. The remainder of the flexor and extensor tendons are intact. Soft tissues Lateral plantar surface of the forefoot overlying the fifth metatarsal base there is a focal area of ulceration measuring approximately 8 mm in transverse dimension. A fluid-filled sinus tract is seen extending to the overlying osseous surface. There is a multilocular fluid collection which extends around the dorsal surface of the fifth metatarsal measuring approximately 5.4 x 0.9 x 3.4 cm. There is extensive dorsal and lateral subcutaneous edema and skin thickening. IMPRESSION: Superficial area of ulceration over the plantar lateral aspect of the fifth metatarsal base with a fluid-filled sinus tract and loculated probable abscess extending over the dorsal surface of the fifth metatarsal measuring 5.4 x 0.9 by is a 3.4 cm. Incomplete mildly angulated fourth and fifth metatarsal shaft fractures with periosteal reaction Findings which could be suggestive of reactive marrow versus early osteomyelitis involving the base of the fourth and fifth metatarsals. Electronically Signed   By: Prudencio Pair M.D.   On: 10/25/2019 19:06   DG Chest Port 1 View  Result Date: 10/26/2019 CLINICAL DATA:  Short of breath.  COVID positive EXAM: PORTABLE CHEST 1 VIEW COMPARISON:  10/25/2019 FINDINGS: Hypoventilation with decreased lung volume. Mild bibasilar airspace disease unchanged. No new infiltrate or effusion. Right arm PICC tip in the SVC unchanged. IMPRESSION: Hypoventilation with bibasilar mild airspace disease unchanged. Electronically Signed   By: Franchot Gallo M.D.   On: 10/26/2019 16:10   DG Chest Port 1 View  Result Date: 10/25/2019 CLINICAL DATA:  Shortness of breath. EXAM:  PORTABLE CHEST 1 VIEW COMPARISON:  October 16, 2019. FINDINGS: The heart size and mediastinal contours are within normal limits. Hypoinflation of the lungs is noted with mild bibasilar subsegmental atelectasis. Endotracheal tube has been removed. Right-sided PICC line is unchanged in position. The visualized skeletal structures are unremarkable. IMPRESSION: Hypoinflation of the lungs with mild bibasilar subsegmental atelectasis. Electronically Signed   By: Marijo Conception M.D.   On: 10/25/2019 08:37   DG CHEST PORT 1 VIEW  Result Date: 10/16/2019 CLINICAL DATA:  Status post intubation. EXAM: PORTABLE CHEST 1 VIEW COMPARISON:  10/14/2019 FINDINGS: ET tube tip is above the carina. There is a right arm PICC line with tip at the cavoatrial junction. Lung volumes are low. Similar appearance of bilateral pulmonary opacities compatible with multifocal pneumonia. IMPRESSION: 1. Stable support apparatus. 2. Persistent bilateral pulmonary opacities compatible with multifocal pneumonia. Electronically Signed   By: Kerby Moors M.D.   On: 10/16/2019 06:14   DG Chest Port 1 View  Result Date: 10/15/2019 CLINICAL DATA:  49 year old male with history of COVID infection. EXAM: PORTABLE CHEST 1 VIEW COMPARISON:  Chest x-ray 10/14/2019. FINDINGS: An endotracheal tube is in place with tip 3.6 cm above the carina. A nasogastric tube is seen extending into the stomach, however, the tip of the nasogastric tube extends below the lower margin of the image. There is a right upper extremity PICC with tip terminating in the right atrium. Lung volumes are low. Widespread patchy areas of interstitial prominence an ill-defined airspace disease again noted throughout the lungs bilaterally. Overall, aeration is essentially unchanged. No pleural effusions. No evidence of pulmonary edema. No pneumothorax. Heart size is normal. Upper mediastinal contours are within normal limits. IMPRESSION: 1. Support apparatus, as above. 2. The appearance  the chest is compatible with multilobar pneumonia from reported COVID infection. Electronically Signed   By: Vinnie Langton M.D.   On: 10/15/2019  05:39   DG Chest Port 1 View  Result Date: 10/14/2019 CLINICAL DATA:  COVID-19, ARDS EXAM: PORTABLE CHEST 1 VIEW COMPARISON:  10/13/2019 FINDINGS: Single frontal view of the chest demonstrates stable position of the endotracheal tube and enteric catheter. There is a right-sided PICC tip overlying superior vena cava. The cardiac silhouette is stable. Interstitial and ground-glass opacities are seen throughout the lungs bilaterally, unchanged. Lung volumes are diminished. No effusion or pneumothorax. IMPRESSION: 1. 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 2. Stable support devices. Electronically Signed   By: Randa Ngo M.D.   On: 10/14/2019 15:57   DG Chest Port 1 View  Result Date: 10/13/2019 CLINICAL DATA:  Acute respiratory failure with hypoxemia, COVID positive, asthma, diabetes mellitus, hypertension, CHF EXAM: PORTABLE CHEST 1 VIEW COMPARISON:  Portable exam 1021 hours compared to 10/12/2019 FINDINGS: Tip of endotracheal tube projects 3.7 cm above carina. Nasogastric tube extends into stomach. RIGHT arm PICC line tip projects over cavoatrial junction. Stable heart size mediastinal contours for technique. Patchy BILATERAL airspace infiltrates consistent with multifocal pneumonia, minimally improved. No pleural effusion or pneumothorax. IMPRESSION: Minimally improved pulmonary infiltrates. Electronically Signed   By: Lavonia Dana M.D.   On: 10/13/2019 10:37   DG CHEST PORT 1 VIEW  Result Date: 10/12/2019 CLINICAL DATA:  Intubation EXAM: PORTABLE CHEST 1 VIEW COMPARISON:  Earlier today FINDINGS: New endotracheal tube with tip at the clavicular heads, measuring 3 cm above the carina. Low volume chest with severe airspace disease. No visible effusion or air leak.  Stable heart size which is distorted by positioning and volumes. IMPRESSION: 1. Unremarkable positioning of the endotracheal tube. 2. Stable extensive airspace disease with low lung volumes. Electronically Signed   By: Monte Fantasia M.D.   On: 10/12/2019 07:13   DG CHEST PORT 1 VIEW  Result Date: 10/12/2019 CLINICAL DATA:  Shortness of breath EXAM: PORTABLE CHEST 1 VIEW COMPARISON:  Three days ago FINDINGS: Very low volume chest with diffuse and patchy pulmonary opacity. Vascular pedicle widening and prominent heart size largely related to technique. No visible effusion or pneumothorax. IMPRESSION: Worsening multifocal pneumonia.  Lung volumes remain very low. Electronically Signed   By: Monte Fantasia M.D.   On: 10/12/2019 04:07   DG Chest Portable 1 View  Result Date: 10/09/2019 CLINICAL DATA:  Cough, fever, shortness of breath EXAM: PORTABLE CHEST 1 VIEW COMPARISON:  None. FINDINGS: Single frontal view of the chest demonstrates unremarkable cardiac silhouette. The lung volumes are diminished, with patchy bilateral areas of faint ground-glass airspace disease consistent with multifocal pneumonia. No effusion or pneumothorax. IMPRESSION: 1. Patchy bilateral ground-glass airspace disease consistent with multifocal pneumonia. Electronically Signed   By: Randa Ngo M.D.   On: 10/09/2019 19:32   DG Abd Portable 1V  Result Date: 10/20/2019 CLINICAL DATA:  GI problem. EXAM: PORTABLE ABDOMEN - 1 VIEW COMPARISON:  10/16/2019 FINDINGS: Feeding tube tip is in the distal stomach or proximal duodenum. Mild gaseous distention of the stomach. Gas throughout nondistended large and small bowel. No evidence of bowel obstruction, organomegaly or free air. IMPRESSION: Feeding tube tip in the distal stomach or proximal duodenum. No evidence of obstruction or free air. Electronically Signed   By: Rolm Baptise M.D.   On: 10/20/2019 21:28   DG Abd Portable 1V  Result Date: 10/16/2019 CLINICAL DATA:  Status post OG  tube placement EXAM: PORTABLE ABDOMEN - 1 VIEW COMPARISON:  10/16/2019 FINDINGS: The enteric tube tip  projects over the distal body of stomach in the side port is below the level of the GE junction. A few air-filled loops of small bowel are noted within the upper abdomen which measure up to 2.8 cm. IMPRESSION: Satisfactory position of enteric tube. Electronically Signed   By: Kerby Moors M.D.   On: 10/16/2019 06:52   DG Abd Portable 1V  Result Date: 10/12/2019 CLINICAL DATA:  Onychogryphosis EXAM: PORTABLE ABDOMEN - 1 VIEW COMPARISON:  Portable exam 0830 hours without priors for comparison FINDINGS: Tip of nasogastric tube projects over distal antrum. Nonobstructive bowel gas pattern. No bowel dilatation or bowel wall thickening. Patchy bibasilar infiltrates. No acute osseous findings. IMPRESSION: Tip of nasogastric tube projects over distal gastric antrum. Nonobstructive bowel gas pattern. Bibasilar pulmonary infiltrates. Electronically Signed   By: Lavonia Dana M.D.   On: 10/12/2019 08:51   DG Foot 2 Views Left  Result Date: 10/23/2019 CLINICAL DATA:  Bacteremia, LEFT foot pain and diabetic. EXAM: LEFT FOOT - 2 VIEW COMPARISON:  10/13/2019 and prior radiographs FINDINGS: Mildly angulated fractures of the 4th and 5th metatarsals are again noted with fracture lines still evident. Healing changes along these fractures are noted. Overlying soft tissue swelling is noted. No new fracture, subluxation or dislocation noted. No definite radiographic evidence of acute osteomyelitis noted. IMPRESSION: Mildly angulated healing fractures of the 4th and 5th metatarsals with soft tissue swelling again noted but without definite radiographic evidence of acute osteomyelitis. Consider MRI for further evaluation as clinically indicated. Electronically Signed   By: Margarette Canada M.D.   On: 10/23/2019 13:40   DG Foot 2 Views Left  Result Date: 10/13/2019 CLINICAL DATA:  49 year old male with diabetic foot. EXAM: LEFT FOOT  - 2 VIEW COMPARISON:  Left foot radiograph dated 09/11/2019. FINDINGS: Evaluation is limited due to positioning. Fractures of the midportion of the fourth and fifth metacarpal with interval progression of bridging callus formation. No new fracture identified. There is no dislocation. There is diffuse soft tissue swelling of the foot. No radiopaque foreign object or soft tissue gas. IMPRESSION: 1. Healing fractures of the fourth and fifth metacarpal. 2. Diffuse soft tissue swelling. No radiopaque foreign object or soft tissue gas. Electronically Signed   By: Anner Crete M.D.   On: 10/13/2019 16:49   ECHOCARDIOGRAM COMPLETE  Result Date: 10/12/2019    ECHOCARDIOGRAM REPORT   Patient Name:   LORAS GRIESHOP Date of Exam: 10/12/2019 Medical Rec #:  [id number]      Height:       74.0 in Accession #:    [id number]     Weight:       245.0 lb Date of Birth:  [birth year]      BSA:          2.369 m Patient Age:    64 years       BP:           111/74 mmHg Patient Gender: M              HR:           75 bpm. Exam Location:  Inpatient Procedure: 2D Echo Indications:    CHF 428                  & Bacteremia 790.7  History:        Patient has no prior history of Echocardiogram examinations.                 CHF; Risk Factors:Hypertension  and Diabetes. Covid +.  Sonographer:    Jannett Celestine RDCS (AE) Referring Phys: [id number] Moment [facility]  Sonographer Comments: Echo performed with patient supine and on artificial respirator. Image acquisition challenging due to respiratory motion and Image acquisition challenging due to patient body habitus. restricted mobility IMPRESSIONS  1. Extremely poor acoustic windows. LVEF appears to be depressed with diffuse hypokinesis, worse in the mid/distal inferior/inferoseptal walls. . Left ventricular ejection fraction, by estimation, is 30 to 35%. The left ventricle has moderately decreased function. The left ventricular internal cavity size was mildly dilated. Left ventricular diastolic  parameters are indeterminate.  2. Right ventricular systolic function is moderately reduced. The right ventricular size is mildly enlarged.  3. The mitral valve is normal in structure. Trivial mitral valve regurgitation.  4. The aortic valve is normal in structure. Aortic valve regurgitation is not visualized. FINDINGS  Left Ventricle: Extremely poor acoustic windows. LVEF appears to be depressed with diffuse hypokinesis, worse in the mid/distal inferior/inferoseptal walls. Left ventricular ejection fraction, by estimation, is 30 to 35%. The left ventricle has moderately decreased function. The left ventricle demonstrates regional wall motion abnormalities. The left ventricular internal cavity size was mildly dilated. There is no left ventricular hypertrophy. Left ventricular diastolic parameters are indeterminate. Right Ventricle: The right ventricular size is mildly enlarged. Right vetricular wall thickness was not assessed. Right ventricular systolic function is moderately reduced. Left Atrium: Left atrial size was normal in size. Right Atrium: Right atrial size was normal in size. Pericardium: There is no evidence of pericardial effusion. Mitral Valve: The mitral valve is normal in structure. Trivial mitral valve regurgitation. Tricuspid Valve: The tricuspid valve is normal in structure. Tricuspid valve regurgitation is trivial. Aortic Valve: The aortic valve is normal in structure. Aortic valve regurgitation is not visualized. Pulmonic Valve: The pulmonic valve was grossly normal. Pulmonic valve regurgitation is trivial. Aorta: The aortic root is normal in size and structure. IAS/Shunts: The interatrial septum was not assessed.  LEFT VENTRICLE PLAX 2D LVIDd:         5.60 cm  Diastology LVIDs:         3.90 cm  LV e' lateral:   8.81 cm/s LV PW:         1.40 cm  LV E/e' lateral: 5.7 LV IVS:        1.10 cm  LV e' medial:    5.87 cm/s LVOT diam:     2.40 cm  LV E/e' medial:  8.5 LV SV:         64 LV SV Index:   27  LVOT Area:     4.52 cm  RIGHT VENTRICLE RV S prime:     9.79 cm/s TAPSE (M-mode): 1.3 cm LEFT ATRIUM             Index       RIGHT ATRIUM           Index LA diam:        3.10 cm 1.31 cm/m  RA Area:     18.80 cm LA Vol (A2C):   62.9 ml 26.55 ml/m RA Volume:   51.80 ml  21.86 ml/m LA Vol (A4C):   40.8 ml 17.22 ml/m LA Biplane Vol: 50.8 ml 21.44 ml/m  AORTIC VALVE LVOT Vmax:   69.80 cm/s LVOT Vmean:  49.400 cm/s LVOT VTI:    0.142 m  AORTA Ao Root diam: 3.60 cm MITRAL VALVE MV Area (PHT): 2.34 cm    SHUNTS MV Decel Time: 324  msec    Systemic VTI:  0.14 m MV E velocity: 50.10 cm/s  Systemic Diam: 2.40 cm MV A velocity: 71.60 cm/s MV E/A ratio:  0.70 Dorris Carnes MD Electronically signed by Dorris Carnes MD Signature Date/Time: 10/12/2019/1:58:41 PM    Final    ECHO TEE  Result Date: 10/24/2019    TRANSESOPHOGEAL ECHO REPORT   Patient Name:   Arie Sabina Date of Exam: 10/24/2019 Medical Rec #:  [id number]      Height:       74.0 in Accession #:    [id number]     Weight:       247.4 lb Date of Birth:  15-May-1970      BSA:          2.379 m Patient Age:    20 years       BP:           120/80 mmHg Patient Gender: M              HR:           100 bpm. Exam Location:  Inpatient Procedure: Transesophageal Echo Indications:    Emobli/Bacteremia  History:        Patient has prior history of Echocardiogram examinations.  Sonographer:    Clayton Lefort RDCS (AE) Referring Phys: [phone] HAO MENG PROCEDURE: The transesophogeal probe was passed without difficulty through the esophogus of the patient. Sedation performed by different physician. The patient's vital signs; including heart rate, blood pressure, and oxygen saturation; remained stable throughout the procedure. The patient developed no complications during the procedure. IMPRESSIONS  1. Left ventricular ejection fraction, by estimation, is 50 to 55%. The left ventricle has low normal function. The left ventricle has no regional wall motion abnormalities.  2. Right  ventricular systolic function is normal. The right ventricular size is normal.  3. No left atrial/left atrial appendage thrombus was detected.  4. The mitral valve is normal in structure. Trivial mitral valve regurgitation. No evidence of mitral stenosis.  5. The aortic valve is normal in structure. Aortic valve regurgitation is not visualized. No aortic stenosis is present.  6. The inferior vena cava is normal in size with greater than 50% respiratory variability, suggesting right atrial pressure of 3 mmHg. Conclusion(s)/Recommendation(s): Normal biventricular function without evidence of hemodynamically significant valvular heart disease. FINDINGS  Left Ventricle: Left ventricular ejection fraction, by estimation, is 50 to 55%. The left ventricle has low normal function. The left ventricle has no regional wall motion abnormalities. The left ventricular internal cavity size was normal in size. There is no left ventricular hypertrophy. Right Ventricle: The right ventricular size is normal. No increase in right ventricular wall thickness. Right ventricular systolic function is normal. Left Atrium: Left atrial size was normal in size. No left atrial/left atrial appendage thrombus was detected. Right Atrium: Right atrial size was normal in size. Pericardium: There is no evidence of pericardial effusion. Mitral Valve: The mitral valve is normal in structure. Normal mobility of the mitral valve leaflets. Trivial mitral valve regurgitation. No evidence of mitral valve stenosis. There is no evidence of mitral valve vegetation. Tricuspid Valve: The tricuspid valve is normal in structure. Tricuspid valve regurgitation is mild . No evidence of tricuspid stenosis. There is no evidence of tricuspid valve vegetation. Aortic Valve: The aortic valve is normal in structure. Aortic valve regurgitation is not visualized. No aortic stenosis is present. There is no evidence of aortic valve vegetation. Pulmonic Valve: The pulmonic valve  was  normal in structure. Pulmonic valve regurgitation is trivial. No evidence of pulmonic stenosis. Aorta: The aortic root is normal in size and structure. Venous: The inferior vena cava is normal in size with greater than 50% respiratory variability, suggesting right atrial pressure of 3 mmHg. IAS/Shunts: No atrial level shunt detected by color flow Doppler. Jenkins Rouge MD Electronically signed by Jenkins Rouge MD Signature Date/Time: 10/24/2019/3:28:45 PM    Final    VAS US CAROTID (at [facility] and [facility] only)  Result Date: 10/26/2019 Carotid Arterial Duplex Study Indications:       7m focus of diffusion restriction in the left                    frontoparietal lobe concerning for acute/early subacute                    infarct vs cerebritis. Risk Factors:      Hypertension, Diabetes. Other Factors:     Covid-19, bacteremia. Comparison Study:  No prior study on file Performing Technologist: CSharion DoveRVS  Examination Guidelines: A complete evaluation includes B-mode imaging, spectral Doppler, color Doppler, and power Doppler as needed of all accessible portions of each vessel. Bilateral testing is considered an integral part of a complete examination. Limited examinations for reoccurring indications may be performed as noted.  Right Carotid Findings: +----------+--------+--------+--------+------------------+--------+           PSV cm/sEDV cm/sStenosisPlaque DescriptionComments +----------+--------+--------+--------+------------------+--------+ CCA Prox  66      11                                         +----------+--------+--------+--------+------------------+--------+ CCA Distal71      18                                         +----------+--------+--------+--------+------------------+--------+ ICA Prox  58      19                                         +----------+--------+--------+--------+------------------+--------+ ICA Distal90      33                                          +----------+--------+--------+--------+------------------+--------+ ECA       114     16                                         +----------+--------+--------+--------+------------------+--------+ +----------+--------+-------+--------+-------------------+           PSV cm/sEDV cmsDescribeArm Pressure (mmHG) +----------+--------+-------+--------+-------------------+ [id number]                                       +----------+--------+-------+--------+-------------------+ +---------+--------+--+--------+--+ VertebralPSV cm/s38EDV cm/s11 +---------+--------+--+--------+--+  Left Carotid Findings: +----------+--------+--------+--------+------------------+--------+           PSV cm/sEDV cm/sStenosisPlaque DescriptionComments +----------+--------+--------+--------+------------------+--------+ CCA Prox  55      14                                         +----------+--------+--------+--------+------------------+--------+  CCA Distal65      20                                         +----------+--------+--------+--------+------------------+--------+ ICA Prox  68      20                                         +----------+--------+--------+--------+------------------+--------+ ICA Distal76      23                                         +----------+--------+--------+--------+------------------+--------+ ECA       77      3                                          +----------+--------+--------+--------+------------------+--------+ +----------+--------+--------+--------+-------------------+           PSV cm/sEDV cm/sDescribeArm Pressure (mmHG) +----------+--------+--------+--------+-------------------+ [id number]                                          +----------+--------+--------+--------+-------------------+ +---------+--------+--+--------+--+ VertebralPSV cm/s63EDV cm/s18 +---------+--------+--+--------+--+   Summary: Right  Carotid: The extracranial vessels were near-normal with only minimal wall                thickening or plaque. Left Carotid: The extracranial vessels were near-normal with only minimal wall               thickening or plaque. Vertebrals:  Bilateral vertebral arteries demonstrate antegrade flow. Subclavians: Normal flow hemodynamics were seen in bilateral subclavian              arteries. *See table(s) above for measurements and observations.  Electronically signed by Antony Contras MD on 10/26/2019 at 12:37:48 PM.    Final    VAS [place] LOWER EXTREMITY ARTERIAL DUPLEX  Result Date: 10/26/2019 LOWER EXTREMITY ARTERIAL DUPLEX STUDY Indications: Ulceration, and Osteomyelitis. High Risk Factors: Hypertension, Diabetes. Other Factors: Covid-19. Charcot foot.  Current ABI: N/A Comparison Study: No prior study Performing Technologist: Sharion Dove RVS  Examination Guidelines: A complete evaluation includes B-mode imaging, spectral Doppler, color Doppler, and power Doppler as needed of all accessible portions of each vessel. Bilateral testing is considered an integral part of a complete examination. Limited examinations for reoccurring indications may be performed as noted.  +----------+--------+-----+--------+---------+--------+ LEFT      PSV cm/sRatioStenosisWaveform Comments +----------+--------+-----+--------+---------+--------+ CFA Prox  123                  triphasic         +----------+--------+-----+--------+---------+--------+ DFA       51                   triphasic         +----------+--------+-----+--------+---------+--------+ SFA Prox  97                   triphasic         +----------+--------+-----+--------+---------+--------+ SFA Mid   88  triphasic         +----------+--------+-----+--------+---------+--------+ SFA Distal81                   triphasic         +----------+--------+-----+--------+---------+--------+ POP Prox  60                    triphasic         +----------+--------+-----+--------+---------+--------+ POP Distal86                   triphasic         +----------+--------+-----+--------+---------+--------+ ATA Distal110                  triphasic         +----------+--------+-----+--------+---------+--------+ PTA Prox  34                   triphasic         +----------+--------+-----+--------+---------+--------+ PTA Mid   29                   triphasic         +----------+--------+-----+--------+---------+--------+ PTA [id number]                  triphasic         +----------+--------+-----+--------+---------+--------+  Summary: Left: Normal waveforms and adequate flow noted.  See table(s) above for measurements and observations. Electronically signed by Servando Snare MD on 10/26/2019 at 4:50:14 PM.    Final    VAS [place] LOWER EXTREMITY ARTERIAL DUPLEX  Result Date: 10/19/2019 LOWER EXTREMITY ARTERIAL DUPLEX STUDY Indications: Cold leg, Covid-19.  Current ABI: N/A Comparison Study: No prior study Performing Technologist: Sharion Dove RVS  Examination Guidelines: A complete evaluation includes B-mode imaging, spectral Doppler, color Doppler, and power Doppler as needed of all accessible portions of each vessel. Bilateral testing is considered an integral part of a complete examination. Limited examinations for reoccurring indications may be performed as noted.  +-----------+--------+-----+--------+---------+--------+ RIGHT      PSV cm/sRatioStenosisWaveform Comments +-----------+--------+-----+--------+---------+--------+ CFA Prox   67                   triphasic         +-----------+--------+-----+--------+---------+--------+ DFA        60                   triphasic         +-----------+--------+-----+--------+---------+--------+ SFA Prox   68                   triphasic         +-----------+--------+-----+--------+---------+--------+ SFA Mid    62                    triphasic         +-----------+--------+-----+--------+---------+--------+ SFA Distal 69                   triphasic         +-----------+--------+-----+--------+---------+--------+ POP Prox   47                   triphasic         +-----------+--------+-----+--------+---------+--------+ POP Distal 42                   triphasic         +-----------+--------+-----+--------+---------+--------+ ATA Distal 15  triphasic         +-----------+--------+-----+--------+---------+--------+ PTA Prox   31                   triphasic         +-----------+--------+-----+--------+---------+--------+ PTA Mid    38                   triphasic         +-----------+--------+-----+--------+---------+--------+ PTA Distal 38                   triphasic         +-----------+--------+-----+--------+---------+--------+ PERO Distal36                   triphasic         +-----------+--------+-----+--------+---------+--------+  Summary: Right: Normal examination. No evidence of arterial occlusive disease. Normal waveforms, no stenosis.  See table(s) above for measurements and observations. Electronically signed by Monica Martinez MD on 10/19/2019 at 3:25:02 PM.    Final    VAS [place] LOWER EXTREMITY VENOUS (DVT)  Result Date: 10/26/2019  Lower Venous DVTStudy Indications: Covid-19, elevated D-Dimer.  Comparison Study: Prior negative left lower extremity venous duplex don 03/19/17                   and is available for comparison. Performing Technologist: Sharion Dove RVS  Examination Guidelines: A complete evaluation includes B-mode imaging, spectral Doppler, color Doppler, and power Doppler as needed of all accessible portions of each vessel. Bilateral testing is considered an integral part of a complete examination. Limited examinations for reoccurring indications may be performed as noted. The reflux portion of the exam is performed with the patient in reverse  Trendelenburg.  +---------+---------------+---------+-----------+----------+--------------+ RIGHT    CompressibilityPhasicitySpontaneityPropertiesThrombus Aging +---------+---------------+---------+-----------+----------+--------------+ CFV      Full           Yes      Yes                                 +---------+---------------+---------+-----------+----------+--------------+ SFJ      Full                                                        +---------+---------------+---------+-----------+----------+--------------+ FV Prox  Full                                                        +---------+---------------+---------+-----------+----------+--------------+ FV Mid   Full                                                        +---------+---------------+---------+-----------+----------+--------------+ FV DistalFull                                                        +---------+---------------+---------+-----------+----------+--------------+  PFV      Full                                                        +---------+---------------+---------+-----------+----------+--------------+ POP      Partial        Yes      Yes                  Acute          +---------+---------------+---------+-----------+----------+--------------+ PTV      Full                                                        +---------+---------------+---------+-----------+----------+--------------+ PERO     Full                                                        +---------+---------------+---------+-----------+----------+--------------+   +---------+---------------+---------+-----------+----------+--------------+ LEFT     CompressibilityPhasicitySpontaneityPropertiesThrombus Aging +---------+---------------+---------+-----------+----------+--------------+ CFV      Full           Yes      Yes                                  +---------+---------------+---------+-----------+----------+--------------+ SFJ      Full                                                        +---------+---------------+---------+-----------+----------+--------------+ FV Prox  Full                                                        +---------+---------------+---------+-----------+----------+--------------+ FV Mid   Full                                                        +---------+---------------+---------+-----------+----------+--------------+ FV DistalFull                                                        +---------+---------------+---------+-----------+----------+--------------+ PFV      Full                                                        +---------+---------------+---------+-----------+----------+--------------+  POP      Full           Yes      Yes                                 +---------+---------------+---------+-----------+----------+--------------+ PTV      Full                                                        +---------+---------------+---------+-----------+----------+--------------+ PERO     Full                                                        +---------+---------------+---------+-----------+----------+--------------+     Summary: RIGHT: - Findings consistent with acute deep vein thrombosis involving the right popliteal vein. - Ultrasound characteristics of enlarged lymph nodes are noted in the groin.  LEFT: - There is no evidence of deep vein thrombosis in the lower extremity.  *See table(s) above for measurements and observations. Electronically signed by Servando Snare MD on 10/26/2019 at 4:50:27 PM.    Final    [place] EKG SITE RITE  Result Date: 10/12/2019 If Site Rite image not attached, placement could not be confirmed due to current cardiac rhythm.

## 2019-10-27 NOTE — Progress Notes (Addendum)
Occupational Therapy Treatment Patient Details Name: Micheal Bell MRN: [id number] DOB: 05/23/1970 Today's Date: 10/27/2019    History of present illness Pt is a 49 y.o. male admitted 10/09/19 for COVID PNA. Pt decompensated 8/19 and intubated; self extubated 8/23 and reintubated; extubated 8/28. Course complicated by MSSA and GBS bacteria, sepsis, systolic HF, acute metabolic encephalopathy. 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 TEE without evidence of vegitation. 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   OT comments  Pt progressing towards established OT goals and demonstrating increased motivation to participate in therapy. Pt continues to present with decreased strength, balance, cognition, and activity tolerance. Pt requiring Max A +2 for sit<>stand with use of stedy; performing 3x during toileting. Requiring Total A for peri care after BM. Once set up in chair with upright posture, pt able to perform self feeding of breakfast with set up and Supervision. Continue to recommend dc to CIR and will continue to follow acutely as admitted.   SpO2 88-100% on 2.5L O2 [place] (unreliable pleth via ear probe), HR 109, BP 134/72 (P)    Follow Up Recommendations  CIR;Supervision/Assistance - 24 hour    Equipment Recommendations  3 in 1 bedside commode;Hospital bed;Wheelchair (measurements OT);Wheelchair cushion (measurements OT)    Recommendations for Other Services Rehab consult;Speech consult    Precautions / Restrictions Precautions Precautions: Fall;Other (comment) Precaution Comments: R heel wound; bowel incontinence Restrictions Weight Bearing Restrictions: No       Mobility Bed Mobility Overal bed mobility: Needs  Assistance Bed Mobility: Supine to Sit     Supine to sit: Max assist;+2 for safety/equipment     General bed mobility comments: Max, repeated cues to initiate movement and stay on task; assist for RLE to EOB, maxA with bilateral HHA to elevate trunk (+2 safety), heavy reliance on BUE support with poor core control  Transfers Overall transfer level: Needs assistance   Transfers: Sit to/from Stand Sit to Stand: Max assist;+2 physical assistance;From elevated surface         General transfer comment: Performed multiple sit<>stands in Stedy frame with maxA+2 to elevate trunk; pt with improving initiation of forward trunk translation and hip extension, intermittent verbal cues for this; R-lateral lean in standing. Transfer to recliner in [employer] Overall balance assessment: Needs assistance   Sitting balance-Leahy Scale: Poor Sitting balance - Comments: Prolonged static sitting at EOB, reliant on LUE support to maintain balance, but improved ability to maintain upright without external assist   Standing balance support: During functional activity;Bilateral upper extremity supported Standing balance-Leahy Scale: Zero Standing balance comment: Able to static stand in Stedy frame with assist+1, BUE support on bar and bilateral knees blocked - can hold for ~15-20 sec; dependent for pericare                           ADL either performed or assessed with clinical judgement   ADL Overall ADL's : Needs assistance/impaired Eating/Feeding: Supervision/ safety;Set up;Sitting Eating/Feeding Details (indicate cue type and reason): Supervision and set up to eat breakfast. Limited ROM at shoulder, so bowels and cups needing to be close to pt's chest for reach.  Lower Body Dressing: Total assistance Lower Body Dressing Details (indicate cue type and reason): don socks Toilet Transfer: Maximal assistance;+2 for physical assistance;+2 for safety/equipment  (sit<>stand with stedy)   Toileting- Clothing Manipulation and Hygiene: Total assistance Toileting - Clothing Manipulation Details (indicate cue type and reason): Total A for peri care after BM     Functional mobility during ADLs: Maximal assistance;+2 for physical assistance;+2 for safety/equipment (sit<>stand with use of stedy) General ADL Comments: Pt presenting with decreased balance, strength, cognition, and acitivty tolerance     Vision   Additional Comments: glasses not in room; will further assess vision   Perception     Praxis      Cognition Arousal/Alertness: Awake/alert Behavior During Therapy: WFL for tasks assessed/performed;Flat affect Overall Cognitive Status: Impaired/Different from baseline Area of Impairment: Orientation;Attention;Memory;Following commands;Safety/judgement;Awareness;Problem solving                 Orientation Level: Disoriented to;Time Current Attention Level: Sustained Memory: Decreased short-term memory Following Commands: Follows one step commands with increased time;Follows one step commands inconsistently Safety/Judgement: Decreased awareness of safety;Decreased awareness of deficits Awareness: Intellectual Problem Solving: Slow processing;Decreased initiation;Requires tactile cues General Comments: Requires frequent redirection to task and cues to initiate. Despite decreased orientation and poor problem solving, pt very motivated to participate in therapy. Poor awareness as seen by pt reporting he can get to bathroom by himself after just using stedy due to weakness.        Exercises Exercises: Other exercises Other Exercises Other Exercises: Warm up ROM/strengthening LE exercise for hip/knee flex/ext with graded resistance and control assist R LE.   Shoulder Instructions       General Comments Pt left in recliner with maximove pad ready for nursing staff to use (RN/NT notified). SpO2 88-100% on 2.5L O2 [place] (unreliable pleth  via ear probe), HR 109, BP 134/72    Pertinent Vitals/ Pain       Pain Assessment: Faces Faces Pain Scale: Hurts a little bit Pain Location: LLE Pain Descriptors / Indicators: Burning Pain Intervention(s): Limited activity within patient's tolerance;Monitored during session;Repositioned  Home Living                                          Prior Functioning/Environment              Frequency  Min 2X/week        Progress Toward Goals  OT Goals(current goals can now be found in the care plan section)  Progress towards OT goals: Progressing toward goals  Acute Rehab OT Goals Patient Stated Goal: get stronger and see "Sparky" OT Goal Formulation: With patient/family Time For Goal Achievement: 11/08/19 Potential to Achieve Goals: Good ADL Goals Pt Will Perform Eating: with set-up;sitting Pt Will Perform Grooming: with set-up;sitting Pt Will Perform Upper Body Bathing: with set-up;with supervision;sitting Pt Will Transfer to Toilet: bedside commode;squat pivot transfer;with +2 assist;with mod assist Additional ADL Goal #1: Pt will maintain midline postural control EOB with min A during ADL task Additional ADL Goal #2: Pt will require min vc to sustain attention to ADL task in minimally distracting environment  Plan Discharge plan remains appropriate    Co-evaluation    PT/OT/SLP Co-Evaluation/Treatment: Yes Reason for Co-Treatment: For patient/therapist safety;To address functional/ADL transfers PT goals addressed during session: Mobility/safety with mobility;Balance OT goals addressed during session: ADL's and self-care      AM-PAC OT "  6 Clicks" Daily Activity     Outcome Measure   Help from another person eating meals?: A Little Help from another person taking care of personal grooming?: A Lot Help from another person toileting, which includes using toliet, bedpan, or urinal?: Total Help from another person bathing (including washing, rinsing,  drying)?: A Lot Help from another person to put on and taking off regular upper body clothing?: A Lot Help from another person to put on and taking off regular lower body clothing?: Total 6 Click Score: 11    End of Session Equipment Utilized During Treatment: Oxygen  OT Visit Diagnosis: Unsteadiness on feet (R26.81);Other abnormalities of gait and mobility (R26.89);Muscle weakness (generalized) (M62.81);Other symptoms and signs involving cognitive function;Hemiplegia and hemiparesis;Pain Hemiplegia - Right/Left: Right Hemiplegia - dominant/non-dominant: Dominant Hemiplegia - caused by: Cerebral infarction Pain - Right/Left: Left Pain - part of body: Shoulder   Activity Tolerance Patient tolerated treatment well   Patient Left with call bell/phone within reach;in chair;with chair alarm set;Other (comment) (lift pad)   Nurse Communication Mobility status;Need for lift equipment        Time: 0910-1000 OT Time Calculation (min): 50 min  Charges: OT General Charges $OT Visit: 1 Visit OT Treatments $Self Care/Home Management : 8-22 mins  Amareon Phung MSOT, OTR/L Acute Rehab Pager: [phone] Office: [phone]   Micheal Bell 10/27/2019, 2:00 PM

## 2019-10-27 NOTE — Progress Notes (Signed)
Physical Therapy Treatment Patient Details Name: Micheal Bell MRN: [id number] DOB: [birth year] Today's Date: 10/27/2019    History of Present Illness Pt is a 49 y.o. male admitted 10/09/19 for COVID PNA. Pt decompensated 8/19 and intubated; self extubated 8/23 and reintubated; extubated 8/28. Course complicated by MSSA and GBS bacteria, sepsis, systolic HF, acute metabolic encephalopathy. 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 TEE without evidence of vegitation. 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   PT Comments    Pt progressing well with mobility, pleasant and motivated to participate. Today's session focused on seated balance and transfer training, pt able to perform multiple sit<>stands and transfer to recliner with Stedy frame, requiring up to maxA+2. Pt continues to demonstrate poor awareness, decreased attention and difficult problem solving. Demonstrates improving strength. Recommend use of maximove lift for transfers with nursing staff. Continue to recommend intensive CIR-level therapies to maximize functional mobility and independence.    Follow Up Recommendations  CIR;Supervision/Assistance - 24 hour     Equipment Recommendations   (TBD)    Recommendations for Other Services       Precautions / Restrictions Precautions Precautions: Fall;Other (comment) Precaution Comments: R heel wound; bowel incontinence Restrictions Weight Bearing Restrictions: No    Mobility  Bed Mobility Overal bed mobility: Needs Assistance Bed Mobility: Supine to Sit     Supine to sit: Max assist;+2 for safety/equipment     General bed mobility comments: Max, repeated cues to initiate movement and stay on task; assist for  RLE to EOB, maxA with bilateral HHA to elevate trunk (+2 safety), heavy reliance on BUE support with poor core control  Transfers Overall transfer level: Needs assistance   Transfers: Sit to/from Stand Sit to Stand: Max assist;+2 physical assistance;From elevated surface         General transfer comment: Performed multiple sit<>stands in Stedy frame with maxA+2 to elevate trunk; pt with improving initiation of forward trunk translation and hip extension, intermittent verbal cues for this; R-lateral lean in standing. Transfer to recliner in [employer]  Ambulation/Gait                 Stairs             Wheelchair Mobility    Modified Rankin (Stroke Patients Only)       Balance Overall balance assessment: Needs assistance   Sitting balance-Leahy Scale: Poor Sitting balance - Comments: Prolonged static sitting at EOB, reliant on LUE support to maintain balance, but improved ability to maintain upright without external assist   Standing balance support: During functional activity;Bilateral upper extremity supported Standing balance-Leahy Scale: Zero Standing balance comment: Able to static stand in Stedy frame with assist+1, BUE support on bar and bilateral knees blocked - can hold for ~15-20 sec; dependent for pericare                            Cognition Arousal/Alertness: Awake/alert Behavior During Therapy: [facility] for tasks assessed/performed;Flat affect Overall Cognitive Status: Impaired/Different from baseline Area of Impairment: Orientation;Attention;Memory;Following commands;Safety/judgement;Awareness;Problem solving                 Orientation Level: Disoriented to;Time Current Attention Level: Sustained Memory: Decreased short-term memory Following Commands: Follows one step commands with increased  time;Follows one step commands inconsistently Safety/Judgement: Decreased awareness of safety;Decreased awareness of deficits Awareness:  Intellectual Problem Solving: Slow processing;Decreased initiation;Requires tactile cues General Comments: Requires frequent redirection to task and cues to initiate      Exercises      General Comments General comments (skin integrity, edema, etc.): Pt left in recliner with maximove pad ready for nursing staff to use (RN/NT notified). SpO2 88-100% on 2.5L O2 [place] (unreliable pleth via ear probe), HR 109, BP 134/72      Pertinent Vitals/Pain Pain Assessment: Faces Faces Pain Scale: Hurts a little bit Pain Location: LLE Pain Descriptors / Indicators: Burning Pain Intervention(s): Monitored during session    Home Living                      Prior Function            PT Goals (current goals can now be found in the care plan section) Progress towards PT goals: Progressing toward goals    Frequency    Min 4X/week      PT Plan Current plan remains appropriate    Co-evaluation PT/OT/SLP Co-Evaluation/Treatment: Yes Reason for Co-Treatment: Complexity of the patient's impairments (multi-system involvement);Necessary to address cognition/behavior during functional activity;For patient/therapist safety;To address functional/ADL transfers PT goals addressed during session: Mobility/safety with mobility;Balance        AM-PAC PT "6 Clicks" Mobility   Outcome Measure  Help needed turning from your back to your side while in a flat bed without using bedrails?: A Lot Help needed moving from lying on your back to sitting on the side of a flat bed without using bedrails?: A Lot Help needed moving to and from a bed to a chair (including a wheelchair)?: Total Help needed standing up from a chair using your arms (e.g., wheelchair or bedside chair)?: Total Help needed to walk in hospital room?: Total Help needed climbing 3-5 steps with a railing? : Total 6 Click Score: 8    End of Session Equipment Utilized During Treatment: Gait belt;Oxygen Activity Tolerance: Patient  tolerated treatment well Patient left: in chair;with call bell/phone within reach;with chair alarm set Nurse Communication: Mobility status;Need for lift equipment PT Visit Diagnosis: Other abnormalities of gait and mobility (R26.89);Muscle weakness (generalized) (M62.81);Other symptoms and signs involving the nervous system (R29.898)     Time: 8676-1950 PT Time Calculation (min) (ACUTE ONLY): 49 min  Charges:  $Therapeutic Activity: 23-37 mins                     Ina Homes, PT, DPT Acute Rehabilitation Services  Pager [phone] Office [phone]  [name] 10/27/2019, 11:36 AM

## 2019-10-27 NOTE — Progress Notes (Signed)
   10/27/19 1420  Clinical Encounter Type  Visited With Patient  Visit Type Follow-up  Referral From Nurse  Consult/Referral To Chaplain  Called patient to follow up on spiritual care, line no answer.This note was prepared by Deneen Harts.  For questions please contact by phone [phone]

## 2019-10-27 NOTE — Progress Notes (Signed)
   10/27/19 1110  Clinical Encounter Type  Visited With Patient  Visit Type Initial  Referral From Nurse  Consult/Referral To Chaplain  Patient requested prayer. Called room 3x's  Line busy, visited room; however, unable to visit , medical team was in session with patient.   This note was prepared by Deneen Harts.  For questions please contact by phone [phone]

## 2019-10-28 LAB — CBC WITH DIFFERENTIAL/PLATELET
Abs Immature Granulocytes: 0.15 10*3/uL — ABNORMAL HIGH (ref 0.00–0.07)
Basophils Absolute: 0.1 10*3/uL (ref 0.0–0.1)
Basophils Relative: 1 %
Eosinophils Absolute: 0.1 10*3/uL (ref 0.0–0.5)
Eosinophils Relative: 1 %
HCT: 32.2 % — ABNORMAL LOW (ref 39.0–52.0)
Hemoglobin: 10.6 g/dL — ABNORMAL LOW (ref 13.0–17.0)
Immature Granulocytes: 1 %
Lymphocytes Relative: 18 %
Lymphs Abs: 1.9 10*3/uL (ref 0.7–4.0)
MCH: 30.8 pg (ref 26.0–34.0)
MCHC: 32.9 g/dL (ref 30.0–36.0)
MCV: 93.6 fL (ref 80.0–100.0)
Monocytes Absolute: 0.5 10*3/uL (ref 0.1–1.0)
Monocytes Relative: 5 %
Neutro Abs: 8 10*3/uL — ABNORMAL HIGH (ref 1.7–7.7)
Neutrophils Relative %: 74 %
Platelets: 372 10*3/uL (ref 150–400)
RBC: 3.44 MIL/uL — ABNORMAL LOW (ref 4.22–5.81)
RDW: 12.5 % (ref 11.5–15.5)
WBC: 10.7 10*3/uL — ABNORMAL HIGH (ref 4.0–10.5)
nRBC: 0 % (ref 0.0–0.2)

## 2019-10-28 LAB — C-REACTIVE PROTEIN: CRP: 8.8 mg/dL — ABNORMAL HIGH (ref <1.0)

## 2019-10-28 LAB — COMPREHENSIVE METABOLIC PANEL
ALT: 18 U/L (ref 0–44)
AST: 24 U/L (ref 15–41)
Albumin: 2.1 g/dL — ABNORMAL LOW (ref 3.5–5.0)
Alkaline Phosphatase: 92 U/L (ref 38–126)
Anion gap: 8 (ref 5–15)
BUN: 8 mg/dL (ref 6–20)
CO2: 25 mmol/L (ref 22–32)
Calcium: 8.3 mg/dL — ABNORMAL LOW (ref 8.9–10.3)
Chloride: 99 mmol/L (ref 98–111)
Creatinine, Ser: 0.75 mg/dL (ref 0.61–1.24)
GFR calc Af Amer: >60 mL/min (ref >60)
GFR calc non Af Amer: >60 mL/min (ref >60)
Glucose, Bld: 205 mg/dL — ABNORMAL HIGH (ref 70–99)
Potassium: 3.5 mmol/L (ref 3.5–5.1)
Sodium: 132 mmol/L — ABNORMAL LOW (ref 135–145)
Total Bilirubin: 0.6 mg/dL (ref 0.3–1.2)
Total Protein: 6.3 g/dL — ABNORMAL LOW (ref 6.5–8.1)

## 2019-10-28 LAB — D-DIMER, QUANTITATIVE: D-Dimer, Quant: 5.74 ug/mL-FEU — ABNORMAL HIGH (ref 0.00–0.50)

## 2019-10-28 LAB — GLUCOSE, CAPILLARY
Glucose-Capillary: 225 mg/dL — ABNORMAL HIGH (ref 70–99)
Glucose-Capillary: 133 mg/dL — ABNORMAL HIGH (ref 70–99)
Glucose-Capillary: 263 mg/dL — ABNORMAL HIGH (ref 70–99)
Glucose-Capillary: 214 mg/dL — ABNORMAL HIGH (ref 70–99)
Glucose-Capillary: 231 mg/dL — ABNORMAL HIGH (ref 70–99)

## 2019-10-28 LAB — CULTURE, BLOOD (ROUTINE X 2)
Culture: NO GROWTH
Culture: NO GROWTH
Special Requests: ADEQUATE
Special Requests: ADEQUATE

## 2019-10-28 LAB — PROCALCITONIN: Procalcitonin: <0.1 ng/mL

## 2019-10-28 LAB — MAGNESIUM: Magnesium: 1.4 mg/dL — ABNORMAL LOW (ref 1.7–2.4)

## 2019-10-28 LAB — BRAIN NATRIURETIC PEPTIDE: B Natriuretic Peptide: 46 pg/mL (ref 0.0–100.0)

## 2019-10-28 MED ORDER — MELATONIN 3 MG PO TABS
3.0000 mg | ORAL_TABLET | Freq: Every day | ORAL | Status: DC
  Administered 2019-10-28 – 2019-11-10 (×13): 3 mg via ORAL
  Filled 2019-10-28 (×16): qty 1

## 2019-10-28 MED ORDER — ALTEPLASE 2 MG IJ SOLR
2.0000 mg | Freq: Once | INTRAMUSCULAR | Status: AC
  Administered 2019-10-28: 2 mg
  Filled 2019-10-28: qty 2

## 2019-10-28 MED ORDER — MAGNESIUM SULFATE 2 GM/50ML IV SOLN
2.0000 g | Freq: Once | INTRAVENOUS | Status: AC
  Administered 2019-10-28: 2 g via INTRAVENOUS
  Filled 2019-10-28: qty 50

## 2019-10-28 MED ORDER — ALTEPLASE 2 MG IJ SOLR
2.0000 mg | Freq: Once | INTRAMUSCULAR | Status: DC
  Filled 2019-10-28: qty 2

## 2019-10-28 MED ORDER — POTASSIUM CHLORIDE CRYS ER 20 MEQ PO TBCR
40.0000 meq | EXTENDED_RELEASE_TABLET | Freq: Once | ORAL | Status: AC
  Administered 2019-10-28: 40 meq via ORAL
  Filled 2019-10-28: qty 2

## 2019-10-28 MED ORDER — LEVETIRACETAM IN NACL 500 MG/100ML IV SOLN
500.0000 mg | Freq: Two times a day (BID) | INTRAVENOUS | Status: DC
  Administered 2019-10-28 – 2019-10-29 (×4): 500 mg via INTRAVENOUS
  Filled 2019-10-28 (×4): qty 100

## 2019-10-28 NOTE — Progress Notes (Signed)
LTM maint complete - no skin breakdown under: F4 F3 Fp1 Fp2

## 2019-10-28 NOTE — Progress Notes (Signed)
PROGRESS NOTE                                                                                                                                                                                                             Patient Demographics:    Micheal Bell, is a 49 y.o. male, DOB - 09/29/1970, YBO:[id number]  Outpatient Primary MD for the patient is [name], MD    LOS - 16  Admit date - 10/09/2019    Chief Complaint  Patient presents with  . Cough       Brief Narrative - 49 yo male with hx of HTN, T2DM, gout, IBS, fibromyalgia, obesity who presented on 8/16 for COVID pneumonia.  He decompensated on 8/19 and was intubated.  Self extubated on 8/23 and was reintubated.  He's subsequently been extubated on 8/28.  He's been on typical therapies for covid including steroids, baricitinib and remdesivir.  His hospitalization was complicated by MSSA and GBS bacteria.  Also found to have systolic heart failure.  Transferred to [facility] on 10/25/19   Subjective:   Patient in bed, appears comfortable, denies any headache, no fever, no chest pain or pressure, no shortness of breath , no abdominal pain. No focal weakness.   Assessment  & Plan :     1. Acute Hypoxic Resp. Failure due to Acute Covid 19 Viral Pneumonitis during the ongoing 2020 Covid 19 Pandemic - he had severe hypoxia and parenchymal lung injury he was intubated and admitted by ICU, he was started on IV steroids, remdesivir and Baricitinib on 10/12/2019.  He was subsequently extubated on 10/21/2019.  His stay was complicated by bacteremia after which Baricitinib was discontinued on 10/24/2019.  His pulmonary status is gradually improving.  Encouraged the patient to sit up in chair in the daytime use I-S and flutter valve for pulmonary toiletry and then prone in bed when at night.  Will advance activity and titrate down oxygen as possible.   SpO2: 98 % O2 Flow Rate  (L/min): 2 L/min FiO2 (%): 97 %  Recent Labs  Lab 10/21/19 1517 10/22/19 0450 10/23/19 0352 10/23/19 1206 10/23/19 1659 10/24/19 0130 10/25/19 0352 10/25/19 0353 10/25/19 1600 10/26/19 0402 10/27/19 0500 10/28/19 0453 10/28/19 0454  WBC  --    < >   < >  --   --  18.1*  15.5*  --   --  14.3* 13.1* 10.7*  --   PLT  --    < >   < >  --   --  554* 506*  --   --  396 339 372  --   CRP  --   --   --   --   --  5.1* 3.7*  --   --  5.4* 9.2* 8.8*  --   BNP  --   --   --   --   --   --   --  34.4  --  38.0 54.1  --  46.0  DDIMER  --    < >   < >  --   --  3.55* 5.12*  --   --  6.29* 5.77* 5.74*  --   PROCALCITON  --    < >   < >  --   --  <0.10  --   --  <0.10 <0.10 0.13 <0.10  --   AST  --   --   --   --   --  34 30  --   --  23 28 24   --   ALT  --   --   --   --   --  31 25  --   --  16 17 18   --   ALKPHOS  --   --   --   --   --  122 105  --   --  89 93 92  --   BILITOT  --   --   --   --   --  0.3 0.4  --   --  1.2 1.0 0.6  --   ALBUMIN  --   --   --   --   --  2.3* 2.5*  --   --  2.3* 2.2* 2.1*  --   INR  --   --   --   --   --   --   --   --  1.2  --   --   --   --   LATICACIDVEN 1.1  --   --  2.3* 1.1  --   --   --   --   --   --   --   --    < > = values in this interval not displayed.    2.  Sepsis with MSSA and Group B strep bacteremia.  ID following, was on IV Ancef but switch to nafcillin on 10/26/2019.  Clean TEE, history of Charcot joints, peripheral neuropathy and recent left foot soft tissue injury, MRI left foot shows an abscess, Ortho has been consulted and they recommended medical management with outpatient follow-up with Dr. Sharol Given post discharge, also has osteomyelitis, defer duration of treatment with antibiotics to ID.  3.  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 Also now evidence of cerebritis on MRI along with ongoing seizures on EEG.  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  For seizures he has been started on Keppra with good effect and his mentation has improved.  Continue PT OT and speech.  Neuro following.   3. Possible systolic heart failure as shown by TTE however this could have  been transient due to sepsis, on repeat TEE his EF is now normalized to 50 to 55%.  Supportive care.  Cardiology on board currently blood pressure soft and he is tachycardic, likely dehydrated due to poor oral intake, hold ARB, increase beta-blocker dose, hydrate with IV fluids and monitor.   4.  Cyanotic right lower extremity toes.  Likely due to hypoperfusion which is transient, arterial ultrasound stable.  5.  History of chronic pain.  On multiple narcotics, Flexeril (low dose to prevent withdrawal) and high-dose benzodiazepines.  Hold all due to metabolic encephalopathy which is severe.  6. HTN -stable on combination of ARB and beta-blocker.   7.  Elevated D-dimer, positive acute DVT in the right lower extremity.  Switched to full dose Lovenox.   8.  DM type II.  Currently on Lantus and sliding scale monitor.  Lab Results  Component Value Date   HGBA1C 11.3 (H) 10/26/2019   CBG (last 3)  Recent Labs    10/27/19 2350 10/28/19 0417 10/28/19 0722  GLUCAP 213* 225* 133*      Condition -   Guarded  Family Communication  :  Sister and mother [phone] - 10/25/19, 10/27/19, 10/28/19  Code Status :  Full  Consults  :  PCCM, ID, Cards  Procedures  :    EEG - This study showed evidence of potential epileptogenicity arising from bilateral frontocentral, maximal vertex region. The sharp waves are at times rhythmic consistent with brief ictal-interictal rhythmic discharges. Additionally, there is evidence of mild diffuse encephalopathy, nonspecific etiology.  Bilateral lower extremity venous ultrasound - Acute right lower extremity popliteal vein  DVT.  Carotid duplex.  Nonacute.    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  CT Head -  1. No acute intracranial abnormality. 2. Area of subcortical low-density involving the posterior left frontal lobe at the convexity, likely encephalomalacia and sequela of remote infarct. There are no prior exams for comparison to establish chronicity. MRI could be considered for further evaluation based on clinical concern. 3. Paranasal sinus fluid levels on opacification of the right greater than left mastoid air cells, possibly related to intubation.  R. Leg Arterial US -  No evidence of arterial occlusive disease. Normal waveforms, no stenosis  TTE -  1. Extremely poor acoustic windows. LVEF appears to be depressed with diffuse hypokinesis, worse in the mid/distal inferior/inferoseptal walls. . Left ventricular ejection fraction, by estimation, is 30 to 35%. The left ventricle has moderately decreased function. The left ventricular internal cavity size was mildly dilated. Left ventricular diastolic parameters are indeterminate.  2. Right ventricular systolic function is moderately reduced. The right ventricular size is mildly enlarged.  3. The mitral valve is normal in structure. Trivial mitral valve regurgitation.  4. The aortic valve is normal in structure. Aortic valve regurgitation is not visualized  TEE - 1. Left ventricular ejection fraction, by estimation, is 50 to 55%. The left ventricle has low normal function. The left ventricle has no regional wall motion abnormalities.  2. Right ventricular systolic function is normal. The right ventricular size is normal.  3. No left atrial/left atrial appendage thrombus was detected.  4. The mitral valve is normal in structure. Trivial mitral valve regurgitation. No evidence of mitral stenosis.  5. The aortic valve is normal in structure. Aortic valve regurgitation is not  visualized. No aortic stenosis is present.  6. The inferior vena cava is normal in size  with greater than 50% respiratory variability, suggesting right atrial pressure of 3 mmHg.  CT - 1. No acute intracranial abnormality. 2. Area of subcortical low-density involving the posterior left frontal lobe at the convexity, likely encephalomalacia and sequela of remote infarct. There are no prior exams for comparison to establish chronicity. MRI could be considered for further evaluation based on clinical concern. 3. Paranasal sinus fluid levels on opacification of the right greater than left mastoid air cells, possibly related to intubation    PUD Prophylaxis : PPI  Disposition Plan  :    Status is: Inpatient  Remains inpatient appropriate because:IV treatments appropriate due to intensity of illness or inability to take PO   Dispo: The patient is from: Home              Anticipated d/c is to: Home              Anticipated d/c date is: 3 days              Patient currently is not medically stable to d/c.   DVT Prophylaxis  :  Lovenox    Lab Results  Component Value Date   PLT 372 10/28/2019    Diet :  Diet Order            Diet heart healthy/carb modified Room service appropriate? No; Fluid consistency: Thin  Diet effective now                  Inpatient Medications  Scheduled Meds: . (feeding supplement) PROSource Plus  30 mL Oral BID BM  . atorvastatin  40 mg Oral Daily  . buPROPion  150 mg Oral Daily  . chlorhexidine gluconate (MEDLINE KIT)  15 mL Mouth Rinse BID  . Chlorhexidine Gluconate Cloth  6 each Topical Daily  . docusate sodium  100 mg Oral BID  . enoxaparin (LOVENOX) injection  120 mg Subcutaneous Q12H  . feeding supplement (ENSURE ENLIVE)  237 mL Oral TID BM  . insulin aspart  0-15 Units Subcutaneous Q4H  . insulin glargine  20 Units Subcutaneous BID  . linagliptin  5 mg Oral Daily  . metoprolol tartrate  50 mg Oral BID  . pantoprazole  40 mg Oral Daily  .  polyethylene glycol  17 g Oral Daily  . potassium chloride  40 mEq Oral Once  . predniSONE  10 mg Oral Q breakfast  . senna  2 tablet Oral BID  . sodium chloride flush  10-40 mL Intracatheter Q12H   Continuous Infusions: . sodium chloride Stopped (10/12/19 1606)  . levETIRAcetam    . magnesium sulfate bolus IVPB    . nafcillin IV 12 g (10/27/19 1444)   PRN Meds:.sodium chloride, bisacodyl, ipratropium-albuterol  Antibiotics  :    Anti-infectives (From admission, onward)   Start     Dose/Rate Route Frequency Ordered Stop   10/26/19 1415  nafcillin 12 g in dextrose 5 % 500 mL IVPB        12 g 20.8 mL/hr over 24 Hours Intravenous Every 24 hours 10/26/19 1414     10/26/19 1400  nafcillin injection 12 g  Status:  Discontinued        12 g Intravenous Daily 10/26/19 1238 10/26/19 1242   10/26/19 1400  nafcillin 12 g in dextrose 5 % 50 mL IVPB  Status:  Discontinued        12 g 100 mL/hr over 30 Minutes Intravenous Every 24 hours 10/26/19 1242 10/26/19 1339  10/26/19 1400  nafcillin 12 g in dextrose 5 % 500 mL IVPB  Status:  Discontinued        12 g 20.8 mL/hr over 24 Hours Intravenous Every 24 hours 10/26/19 1337 10/26/19 1416   10/26/19 1200  nafcillin 2 g in sodium chloride 0.9 % 100 mL IVPB  Status:  Discontinued        2 g 200 mL/hr over 30 Minutes Intravenous Every 4 hours 10/26/19 1010 10/26/19 1238   10/12/19 2200  cefTRIAXone (ROCEPHIN) 2 g in sodium chloride 0.9 % 100 mL IVPB  Status:  Discontinued        2 g 200 mL/hr over 30 Minutes Intravenous Every 24 hours 10/12/19 0441 10/12/19 0921   10/12/19 0930  ceFAZolin (ANCEF) IVPB 2g/100 mL premix  Status:  Discontinued        2 g 200 mL/hr over 30 Minutes Intravenous Every 8 hours 10/12/19 0921 10/26/19 1010   10/11/19 1000  remdesivir 100 mg in sodium chloride 0.9 % 100 mL IVPB       "Followed by" Linked Group Details   100 mg 200 mL/hr over 30 Minutes Intravenous Daily 10/10/19 0050 10/14/19 0956   10/11/19 0800   vancomycin (VANCOCIN) IVPB 1000 mg/200 mL premix  Status:  Discontinued        1,000 mg 200 mL/hr over 60 Minutes Intravenous Every 8 hours 10/10/19 2239 10/12/19 0921   10/10/19 2230  vancomycin (VANCOCIN) IVPB 1000 mg/200 mL premix        1,000 mg 200 mL/hr over 60 Minutes Intravenous Every 1 hr x 2 10/10/19 2208 10/11/19 0052   10/10/19 2215  cefTRIAXone (ROCEPHIN) 2 g in sodium chloride 0.9 % 100 mL IVPB  Status:  Discontinued        2 g 200 mL/hr over 30 Minutes Intravenous Every 24 hours 10/10/19 2205 10/12/19 0441   10/10/19 0100  remdesivir 100 mg in sodium chloride 0.9 % 100 mL IVPB       "Followed by" Linked Group Details   100 mg 200 mL/hr over 30 Minutes Intravenous Every 30 min 10/10/19 0050 10/10/19 0332       Time Spent in minutes  30   Lala Lund M.D on 10/28/2019 at 9:48 AM  To page go to [email or web address] - password [facility]  [facility] -  Office  [phone]   See all Orders from today for further details    Objective:   Vitals:   10/27/19 2355 10/28/19 0414 10/28/19 0416 10/28/19 0723  BP: 112/63 129/77    Pulse: 98 85    Resp: 18 20  18   Temp: 98.8 F (37.1 C) 97.8 F (36.6 C)  98.3 F (36.8 C)  TempSrc: Oral Oral  Oral  SpO2: 100% 98%    Weight:   116 kg   Height:        Wt Readings from Last 3 Encounters:  10/28/19 116 kg  09/11/19 111.1 kg  05/12/17 (!) 145.2 kg     Intake/Output Summary (Last 24 hours) at 10/28/2019 0948 Last data filed at 10/27/2019 1100 Gross per 24 hour  Intake --  Output 725 ml  Net -725 ml     Physical Exam  Awake & alert, chronic right-sided weakness, deltoid weakness in both extremities,  Awake Alert, No new F.N deficits, Normal affect .AT,PERRAL Supple Neck,No JVD, No cervical lymphadenopathy appriciated.  Symmetrical Chest wall movement, Good air movement bilaterally, CTAB RRR,No Gallops, Rubs or new Murmurs, No Parasternal Heave +ve B.Sounds,  Abd Soft, No tenderness, No organomegaly appriciated,  No rebound - guarding or rigidity. Mild cyanosis of the right toes, both ankles under bandage,    Data Review:    CBC Recent Labs  Lab 10/24/19 0130 10/25/19 0352 10/26/19 0402 10/27/19 0500 10/28/19 0453  WBC 18.1* 15.5* 14.3* 13.1* 10.7*  HGB 12.3* 13.2 11.2* 10.4* 10.6*  HCT 39.2 40.1 34.3* 31.5* 32.2*  PLT 554* 506* 396 339 372  MCV 94.9 94.1 93.7 92.4 93.6  MCH 29.8 31.0 30.6 30.5 30.8  MCHC 31.4 32.9 32.7 33.0 32.9  RDW 12.2 12.4 12.3 12.3 12.5  LYMPHSABS 3.8 2.4 2.4 2.2 1.9  MONOABS 1.1* 0.8 0.8 0.7 0.5  EOSABS 0.1 0.1 0.1 0.1 0.1  BASOSABS 0.1 0.1 0.1 0.0 0.1    Recent Labs  Lab  0000 10/21/19 1517 10/22/19 0450 10/23/19 1119 10/23/19 1206 10/23/19 1659 10/24/19 0130 10/25/19 0352 10/25/19 0353 10/25/19 1600 10/26/19 0402 10/26/19 0439 10/27/19 0500 10/28/19 0453 10/28/19 0454  NA   < >  --    < >  --   --   --  143 141  --   --  134*  --  135 132*  --   K   < >  --    < >  --   --   --  3.5 3.3*  --   --  3.5  --  3.5 3.5  --   CL   < >  --    < >  --   --   --  102 102  --   --  101  --  102 99  --   CO2   < >  --    < >  --   --   --  29 27  --   --  24  --  25 25  --   GLUCOSE   < >  --    < >  --   --   --  82 100*  --   --  111*  --  134* 205*  --   BUN   < >  --    < >  --   --   --  22* 17  --   --  12  --  11 8  --   CREATININE   < >  --    < >  --   --   --  0.76 0.69  --   --  0.75  --  0.73 0.75  --   CALCIUM   < >  --    < >  --   --   --  9.2 9.1  --   --  8.6*  --  8.5* 8.3*  --   AST  --   --   --   --   --   --  34 30  --   --  23  --  28 24  --   ALT  --   --   --   --   --   --  31 25  --   --  16  --  17 18  --   ALKPHOS  --   --   --   --   --   --  122 105  --   --  89  --  93 92  --   BILITOT  --   --   --   --   --   --  0.3 0.4  --   --  1.2  --  1.0 0.6  --   ALBUMIN  --   --   --   --   --   --  2.3* 2.5*  --   --  2.3*  --  2.2* 2.1*  --   MG  --   --   --   --   --   --  2.0 1.7  --   --  1.5*  --  1.7 1.4*  --   CRP  --    --   --   --   --   --  5.1* 3.7*  --   --  5.4*  --  9.2* 8.8*  --   DDIMER  --   --    < >   < >  --   --  3.55* 5.12*  --   --  6.29*  --  5.77* 5.74*  --   PROCALCITON  --   --    < >   < >  --   --  <0.10  --   --  <0.10 <0.10  --  0.13 <0.10  --   LATICACIDVEN  --  1.1  --   --  2.3* 1.1  --   --   --   --   --   --   --   --   --   INR  --   --   --   --   --   --   --   --   --  1.2  --   --   --   --   --   TSH  --   --   --   --  1.422  --   --   --   --   --   --  4.137  --   --   --   HGBA1C  --   --   --   --   --   --   --   --   --   --  11.3*  --   --   --   --   AMMONIA  --   --   --   --  22  --   --   --   --   --   --   --   --   --   --   BNP  --   --   --   --   --   --   --   --  34.4  --  38.0  --  54.1  --  46.0   < > = values in this interval not displayed.    ------------------------------------------------------------------------------------------------------------------ Recent Labs    10/26/19 0402  CHOL 168  HDL 43  LDLCALC 99  TRIG 130  CHOLHDL 3.9    Lab Results  Component Value Date   HGBA1C 11.3 (H) 10/26/2019   ------------------------------------------------------------------------------------------------------------------ Recent Labs    10/26/19 0439  TSH 4.137    Cardiac Enzymes No results for input(s): CKMB, TROPONINI, MYOGLOBIN in the last 168 hours.  Invalid input(s): CK ------------------------------------------------------------------------------------------------------------------    Component Value Date/Time   BNP 46.0 10/28/2019 0454    Micro Results Recent Results (from the past 240 hour(s))  Culture, blood (routine x 2)     Status: None (Preliminary result)   Collection Time: 10/23/19 10:58 AM   Specimen:  BLOOD  Result Value Ref Range Status   Specimen Description BLOOD LEFT ANTECUBITAL  Final   Special Requests   Final    BOTTLES DRAWN AEROBIC ONLY Blood Culture adequate volume   Culture   Final    NO GROWTH 4  DAYS Performed at [facility], [address], [place], [place] [address]    Report Status PENDING  Incomplete  Culture, blood (routine x 2)     Status: None (Preliminary result)   Collection Time: 10/23/19 11:00 AM   Specimen: BLOOD LEFT HAND  Result Value Ref Range Status   Specimen Description BLOOD LEFT HAND  Final   Special Requests   Final    BOTTLES DRAWN AEROBIC ONLY Blood Culture adequate volume   Culture   Final    NO GROWTH 4 DAYS Performed at [facility], [place] [address], [place], [place] [address]    Report Status PENDING  Incomplete    Radiology Reports CT HEAD WO CONTRAST  Result Date: 10/19/2019 CLINICAL DATA:  Deliriums.  Mental status change. EXAM: CT HEAD WITHOUT CONTRAST TECHNIQUE: Contiguous axial images were obtained from the base of the skull through the vertex without intravenous contrast. COMPARISON:  None. FINDINGS: Brain: Area subcortical low-density involving the posterior left frontal lobe at the convexity, series 3, image 25 and series 5, image 49, likely encephalomalacia and sequela of remote infarct, however nonspecific. No hemorrhage. No evidence of acute ischemia. No hydrocephalus, midline shift or mass effect. No subdural or extra-axial collection. Vascular: Atherosclerosis of skullbase vasculature without hyperdense vessel or abnormal calcification. Skull: No fracture or focal lesion. Sinuses/Orbits: Nasogastric tube in place. Fluid levels in the sphenoid sinuses and right maxillary sinus with scattered opacification of ethmoid air cells may be related to intubation. Opacification of the right greater than left mastoid air cells. Other: None. IMPRESSION: 1. No acute intracranial abnormality. 2. Area of subcortical low-density involving the posterior left frontal lobe at the convexity, likely encephalomalacia and sequela of remote infarct. There are no prior exams for comparison to establish chronicity. MRI could be considered for further  evaluation based on clinical concern. 3. Paranasal sinus fluid levels on opacification of the right greater than left mastoid air cells, possibly related to intubation. Electronically Signed   By: Keith Rake M.D.   On: 10/19/2019 15:30   CT ANGIO CHEST PE W OR WO CONTRAST  Result Date: 10/23/2019 CLINICAL DATA:  COVID pneumonia, acute hypoxic respiratory failure, positive D-dimer EXAM: CT ANGIOGRAPHY CHEST WITH CONTRAST TECHNIQUE: Multidetector CT imaging of the chest was performed using the standard protocol during bolus administration of intravenous contrast. Multiplanar CT image reconstructions and MIPs were obtained to evaluate the vascular anatomy. CONTRAST:  57m OMNIPAQUE IOHEXOL 350 MG/ML SOLN COMPARISON:  None. FINDINGS: Cardiovascular: Satisfactory opacification of the pulmonary arteries to the segmental level. No evidence of pulmonary embolism. The central pulmonary arteries are of normal caliber. There is moderate coronary artery calcification. Normal heart size. No pericardial effusion. The thoracic aorta is unremarkable save for bovine arch anatomy. Right upper extremity PICC line tip is seen within the superior vena cava. Mediastinum/Nodes: No enlarged mediastinal, hilar, or axillary lymph nodes. Thyroid gland, trachea, and esophagus demonstrate no significant findings. Nasoenteric feeding tube extends into the upper abdomen beyond the margin of the examination. Lungs/Pleura: Lung volumes are small. Evaluation is limited by motion artifact, however, multifocal pulmonary infiltrates are identified demonstrating a peripheral and basal predominance, likely infectious or inflammatory in the acute setting. No central obstructing  lesion. No pneumothorax or pleural effusion. No superimposed interstitial edema. Upper Abdomen: No acute abnormality. Musculoskeletal: No chest wall abnormality. No acute or significant osseous findings. Review of the MIP images confirms the above findings. IMPRESSION: 1.  No evidence of pulmonary embolism. 2. Multifocal pulmonary infiltrates are identified demonstrating a peripheral and basal predominance, likely infectious or inflammatory in the acute setting. 3. Moderate coronary artery calcification. Electronically Signed   By: Fidela Salisbury MD   On: 10/23/2019 16:25   MR BRAIN WO CONTRAST  Result Date: 10/25/2019 CLINICAL DATA:  Neuro deficit, acute, stroke suspected. Additional history provided: COVID positive. Additional history obtained from [facility] with MSSA and group B strep bacteremia. EXAM: MRI HEAD WITHOUT CONTRAST TECHNIQUE: Multiplanar, multiecho pulse sequences of the brain and surrounding structures were obtained without intravenous contrast. COMPARISON:  Head CT 10/19/2019. FINDINGS: Brain: The examination is limited by poor signal on several sequences as well as motion degradation. Most notably, there is moderate motion degradation of the axial T2 weighted sequence, severe motion degradation of the axial SWI sequence and moderate motion degradation of the coronal T2 weighted sequence. Cerebral volume is normal for age. There is a 17 mm focus of restricted diffusion and corresponding T2/FLAIR hyperintensity within the subcortical left frontoparietal lobes (for instance as seen on series 2, image 39) (series 3, image 17). Additional mild scattered T2/FLAIR hyperintensity within the cerebral white matter and pons is nonspecific, but consistent with chronic small vessel ischemic disease. Severe motion degradation of the axial SWI sequence precludes adequate evaluation for intracranial blood products. No extra-axial fluid collection. No midline shift. Vascular: Expected proximal arterial flow voids. Skull and upper cervical spine: No focal marrow lesion is identified within described limitations. Sinuses/Orbits: Visualized orbits show no acute finding. Air-fluid levels within the sphenoid and right maxillary sinuses. Partial opacification of  right ethmoid air cells. Bilateral mastoid effusions. These results will be called to the ordering clinician or representative by the Radiologist Assistant, and communication documented in the PACS or [employer]. IMPRESSION: Examination limited by poor signal on several sequences as well as motion degradation, as described. 17 mm focus of restricted diffusion and T2/FLAIR hyperintensity within the subcortical left frontoparietal lobes. This finding is nonspecific, but given the patient's history, the primary differential considerations are acute/early subacute infarct versus cerebritis. Consider contrast-enhanced MR imaging of the brain for further evaluation of this lesion. Mild chronic small vessel ischemic disease. Paranasal sinus disease with air-fluid levels. Bilateral mastoid effusions. Electronically Signed   By: Kellie Simmering DO   On: 10/25/2019 15:16   MR BRAIN W CONTRAST  Result Date: 10/25/2019 CLINICAL DATA:  Brain mass follow-up EXAM: MRI HEAD WITH CONTRAST TECHNIQUE: Multiplanar, multiecho pulse sequences of the brain and surrounding structures were obtained with intravenous contrast. CONTRAST:  39m GADAVIST GADOBUTROL 1 MMOL/ML IV SOLN COMPARISON:  Brain MRI without contrast 10/25/2019 FINDINGS: Brain: There is a peripherally enhancing lesion at the base of the left precentral gyrus that measures 0.9 x 0.7 x 1.8 cm. There are no other areas of abnormal contrast enhancement. 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 Electronically Signed   By: KUlyses JarredM.D.   On: 10/25/2019 22:47   MR FOOT LEFT WO CONTRAST  Result Date: 10/25/2019 CLINICAL DATA:  Bacteremia left foot pain and diabetic EXAM: MRI OF THE LEFT FOOT WITHOUT CONTRAST TECHNIQUE: Multiplanar, multisequence MR imaging of the left was performed. No intravenous contrast  was administered. COMPARISON:  October 23, 2019 FINDINGS:  Bones/Joint/Cartilage Mildly angulated nondisplaced fractures of the midshaft of the fourth and fifth metatarsals are noted. There is periosteal reaction seen along the medial margins of the metatarsal shafts. Increased heterogeneous T2 hyperintense signal with subtle T1 hypointensity seen at the base of the fourth and fifth metatarsals. No other osseous marrow signal abnormality is seen. There is no large knee joint effusions noted. The articular surfaces appear to be maintained. Ligaments The Lisfranc ligament is intact. Muscles and Tendons Increased feathery signal with atrophy is seen throughout the muscles of the forefoot. There is a small amount of fluid seen surrounding the posterior tibialis tendon. The remainder of the flexor and extensor tendons are intact. Soft tissues Lateral plantar surface of the forefoot overlying the fifth metatarsal base there is a focal area of ulceration measuring approximately 8 mm in transverse dimension. A fluid-filled sinus tract is seen extending to the overlying osseous surface. There is a multilocular fluid collection which extends around the dorsal surface of the fifth metatarsal measuring approximately 5.4 x 0.9 x 3.4 cm. There is extensive dorsal and lateral subcutaneous edema and skin thickening. IMPRESSION: Superficial area of ulceration over the plantar lateral aspect of the fifth metatarsal base with a fluid-filled sinus tract and loculated probable abscess extending over the dorsal surface of the fifth metatarsal measuring 5.4 x 0.9 by is a 3.4 cm. Incomplete mildly angulated fourth and fifth metatarsal shaft fractures with periosteal reaction Findings which could be suggestive of reactive marrow versus early osteomyelitis involving the base of the fourth and fifth metatarsals. Electronically Signed   By: Prudencio Pair M.D.   On: 10/25/2019 19:06   DG Chest Port 1 View  Result Date: 10/26/2019 CLINICAL DATA:  Short of breath.  COVID positive EXAM: PORTABLE CHEST 1  VIEW COMPARISON:  10/25/2019 FINDINGS: Hypoventilation with decreased lung volume. Mild bibasilar airspace disease unchanged. No new infiltrate or effusion. Right arm PICC tip in the SVC unchanged. IMPRESSION: Hypoventilation with bibasilar mild airspace disease unchanged. Electronically Signed   By: Franchot Gallo M.D.   On: 10/26/2019 16:10   DG Chest Port 1 View  Result Date: 10/25/2019 CLINICAL DATA:  Shortness of breath. EXAM: PORTABLE CHEST 1 VIEW COMPARISON:  October 16, 2019. FINDINGS: The heart size and mediastinal contours are within normal limits. Hypoinflation of the lungs is noted with mild bibasilar subsegmental atelectasis. Endotracheal tube has been removed. Right-sided PICC line is unchanged in position. The visualized skeletal structures are unremarkable. IMPRESSION: Hypoinflation of the lungs with mild bibasilar subsegmental atelectasis. Electronically Signed   By: Marijo Conception M.D.   On: 10/25/2019 08:37   DG CHEST PORT 1 VIEW  Result Date: 10/16/2019 CLINICAL DATA:  Status post intubation. EXAM: PORTABLE CHEST 1 VIEW COMPARISON:  10/14/2019 FINDINGS: ET tube tip is above the carina. There is a right arm PICC line with tip at the cavoatrial junction. Lung volumes are low. Similar appearance of bilateral pulmonary opacities compatible with multifocal pneumonia. IMPRESSION: 1. Stable support apparatus. 2. Persistent bilateral pulmonary opacities compatible with multifocal pneumonia. Electronically Signed   By: Kerby Moors M.D.   On: 10/16/2019 06:14   DG Chest Port 1 View  Result Date: 10/15/2019 CLINICAL DATA:  49 year old male with history of COVID infection. EXAM: PORTABLE CHEST 1 VIEW COMPARISON:  Chest x-ray 10/14/2019. FINDINGS: An endotracheal tube is in place with tip 3.6 cm above the carina. A nasogastric tube is seen extending into the stomach, however, the tip of  the nasogastric tube extends below the lower margin of the image. There is a right upper extremity PICC with  tip terminating in the right atrium. Lung volumes are low. Widespread patchy areas of interstitial prominence an ill-defined airspace disease again noted throughout the lungs bilaterally. Overall, aeration is essentially unchanged. No pleural effusions. No evidence of pulmonary edema. No pneumothorax. Heart size is normal. Upper mediastinal contours are within normal limits. IMPRESSION: 1. Support apparatus, as above. 2. The appearance the chest is compatible with multilobar pneumonia from reported COVID infection. Electronically Signed   By: Vinnie Langton M.D.   On: 10/15/2019 05:39   DG Chest Port 1 View  Result Date: 10/14/2019 CLINICAL DATA:  COVID-19, ARDS EXAM: PORTABLE CHEST 1 VIEW COMPARISON:  10/13/2019 FINDINGS: Single frontal view of the chest demonstrates stable position of the endotracheal tube and enteric catheter. There is a right-sided PICC tip overlying superior vena cava. The cardiac silhouette is stable. Interstitial and ground-glass opacities are seen throughout the lungs bilaterally, unchanged. Lung volumes are diminished. No effusion or pneumothorax. IMPRESSION: 1. 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 2. Stable support devices. Electronically Signed   By: Randa Ngo M.D.   On: 10/14/2019 15:57   DG Chest Port 1 View  Result Date: 10/13/2019 CLINICAL DATA:  Acute respiratory failure with hypoxemia, COVID positive, asthma, diabetes mellitus, hypertension, CHF EXAM: PORTABLE CHEST 1 VIEW COMPARISON:  Portable exam 1021 hours compared to 10/12/2019 FINDINGS: Tip of endotracheal tube projects 3.7 cm above carina. Nasogastric tube extends into stomach. RIGHT arm PICC line tip projects over cavoatrial junction. Stable heart size mediastinal contours for technique. Patchy BILATERAL airspace infiltrates consistent with multifocal pneumonia, minimally improved. No pleural effusion or  pneumothorax. IMPRESSION: Minimally improved pulmonary infiltrates. Electronically Signed   By: Lavonia Dana M.D.   On: 10/13/2019 10:37   DG CHEST PORT 1 VIEW  Result Date: 10/12/2019 CLINICAL DATA:  Intubation EXAM: PORTABLE CHEST 1 VIEW COMPARISON:  Earlier today FINDINGS: New endotracheal tube with tip at the clavicular heads, measuring 3 cm above the carina. Low volume chest with severe airspace disease. No visible effusion or air leak. Stable heart size which is distorted by positioning and volumes. IMPRESSION: 1. Unremarkable positioning of the endotracheal tube. 2. Stable extensive airspace disease with low lung volumes. Electronically Signed   By: Monte Fantasia M.D.   On: 10/12/2019 07:13   DG CHEST PORT 1 VIEW  Result Date: 10/12/2019 CLINICAL DATA:  Shortness of breath EXAM: PORTABLE CHEST 1 VIEW COMPARISON:  Three days ago FINDINGS: Very low volume chest with diffuse and patchy pulmonary opacity. Vascular pedicle widening and prominent heart size largely related to technique. No visible effusion or pneumothorax. IMPRESSION: Worsening multifocal pneumonia.  Lung volumes remain very low. Electronically Signed   By: Monte Fantasia M.D.   On: 10/12/2019 04:07   DG Chest Portable 1 View  Result Date: 10/09/2019 CLINICAL DATA:  Cough, fever, shortness of breath EXAM: PORTABLE CHEST 1 VIEW COMPARISON:  None. FINDINGS: Single frontal view of the chest demonstrates unremarkable cardiac silhouette. The lung volumes are diminished, with patchy bilateral areas of faint ground-glass airspace disease consistent with multifocal pneumonia. No effusion or pneumothorax. IMPRESSION: 1. Patchy bilateral ground-glass airspace disease consistent with multifocal pneumonia. Electronically Signed   By: Randa Ngo M.D.   On: 10/09/2019 19:32   DG Abd Portable 1V  Result Date: 10/20/2019 CLINICAL DATA:  GI problem. EXAM: PORTABLE ABDOMEN -  1 VIEW COMPARISON:  10/16/2019 FINDINGS: Feeding tube tip is in the  distal stomach or proximal duodenum. Mild gaseous distention of the stomach. Gas throughout nondistended large and small bowel. No evidence of bowel obstruction, organomegaly or free air. IMPRESSION: Feeding tube tip in the distal stomach or proximal duodenum. No evidence of obstruction or free air. Electronically Signed   By: Rolm Baptise M.D.   On: 10/20/2019 21:28   DG Abd Portable 1V  Result Date: 10/16/2019 CLINICAL DATA:  Status post OG tube placement EXAM: PORTABLE ABDOMEN - 1 VIEW COMPARISON:  10/16/2019 FINDINGS: The enteric tube tip projects over the distal body of stomach in the side port is below the level of the GE junction. A few air-filled loops of small bowel are noted within the upper abdomen which measure up to 2.8 cm. IMPRESSION: Satisfactory position of enteric tube. Electronically Signed   By: Kerby Moors M.D.   On: 10/16/2019 06:52   DG Abd Portable 1V  Result Date: 10/12/2019 CLINICAL DATA:  Onychogryphosis EXAM: PORTABLE ABDOMEN - 1 VIEW COMPARISON:  Portable exam 0830 hours without priors for comparison FINDINGS: Tip of nasogastric tube projects over distal antrum. Nonobstructive bowel gas pattern. No bowel dilatation or bowel wall thickening. Patchy bibasilar infiltrates. No acute osseous findings. IMPRESSION: Tip of nasogastric tube projects over distal gastric antrum. Nonobstructive bowel gas pattern. Bibasilar pulmonary infiltrates. Electronically Signed   By: Lavonia Dana M.D.   On: 10/12/2019 08:51   DG Foot 2 Views Left  Result Date: 10/23/2019 CLINICAL DATA:  Bacteremia, LEFT foot pain and diabetic. EXAM: LEFT FOOT - 2 VIEW COMPARISON:  10/13/2019 and prior radiographs FINDINGS: Mildly angulated fractures of the 4th and 5th metatarsals are again noted with fracture lines still evident. Healing changes along these fractures are noted. Overlying soft tissue swelling is noted. No new fracture, subluxation or dislocation noted. No definite radiographic evidence of acute  osteomyelitis noted. IMPRESSION: Mildly angulated healing fractures of the 4th and 5th metatarsals with soft tissue swelling again noted but without definite radiographic evidence of acute osteomyelitis. Consider MRI for further evaluation as clinically indicated. Electronically Signed   By: Margarette Canada M.D.   On: 10/23/2019 13:40   DG Foot 2 Views Left  Result Date: 10/13/2019 CLINICAL DATA:  49 year old male with diabetic foot. EXAM: LEFT FOOT - 2 VIEW COMPARISON:  Left foot radiograph dated 09/11/2019. FINDINGS: Evaluation is limited due to positioning. Fractures of the midportion of the fourth and fifth metacarpal with interval progression of bridging callus formation. No new fracture identified. There is no dislocation. There is diffuse soft tissue swelling of the foot. No radiopaque foreign object or soft tissue gas. IMPRESSION: 1. Healing fractures of the fourth and fifth metacarpal. 2. Diffuse soft tissue swelling. No radiopaque foreign object or soft tissue gas. Electronically Signed   By: Anner Crete M.D.   On: 10/13/2019 16:49   ECHOCARDIOGRAM COMPLETE  Result Date: 10/12/2019    ECHOCARDIOGRAM REPORT   Patient Name:   RACIEL CAFFREY Date of Exam: 10/12/2019 Medical Rec #:  [id number]      Height:       74.0 in Accession #:    [id number]     Weight:       245.0 lb Date of Birth:  01/05/1971      BSA:          2.369 m Patient Age:    81 years       BP:  111/74 mmHg Patient Gender: M              HR:           75 bpm. Exam Location:  Inpatient Procedure: 2D Echo Indications:    CHF 428                  & Bacteremia 790.7  History:        Patient has no prior history of Echocardiogram examinations.                 CHF; Risk Factors:Hypertension and Diabetes. Covid +.  Sonographer:    Jannett Celestine RDCS (AE) Referring Phys: [id number] Silvestre Moment [facility]  Sonographer Comments: Echo performed with patient supine and on artificial respirator. Image acquisition challenging due to respiratory motion and  Image acquisition challenging due to patient body habitus. restricted mobility IMPRESSIONS  1. Extremely poor acoustic windows. LVEF appears to be depressed with diffuse hypokinesis, worse in the mid/distal inferior/inferoseptal walls. . Left ventricular ejection fraction, by estimation, is 30 to 35%. The left ventricle has moderately decreased function. The left ventricular internal cavity size was mildly dilated. Left ventricular diastolic parameters are indeterminate.  2. Right ventricular systolic function is moderately reduced. The right ventricular size is mildly enlarged.  3. The mitral valve is normal in structure. Trivial mitral valve regurgitation.  4. The aortic valve is normal in structure. Aortic valve regurgitation is not visualized. FINDINGS  Left Ventricle: Extremely poor acoustic windows. LVEF appears to be depressed with diffuse hypokinesis, worse in the mid/distal inferior/inferoseptal walls. Left ventricular ejection fraction, by estimation, is 30 to 35%. The left ventricle has moderately decreased function. The left ventricle demonstrates regional wall motion abnormalities. The left ventricular internal cavity size was mildly dilated. There is no left ventricular hypertrophy. Left ventricular diastolic parameters are indeterminate. Right Ventricle: The right ventricular size is mildly enlarged. Right vetricular wall thickness was not assessed. Right ventricular systolic function is moderately reduced. Left Atrium: Left atrial size was normal in size. Right Atrium: Right atrial size was normal in size. Pericardium: There is no evidence of pericardial effusion. Mitral Valve: The mitral valve is normal in structure. Trivial mitral valve regurgitation. Tricuspid Valve: The tricuspid valve is normal in structure. Tricuspid valve regurgitation is trivial. Aortic Valve: The aortic valve is normal in structure. Aortic valve regurgitation is not visualized. Pulmonic Valve: The pulmonic valve was grossly  normal. Pulmonic valve regurgitation is trivial. Aorta: The aortic root is normal in size and structure. IAS/Shunts: The interatrial septum was not assessed.  LEFT VENTRICLE PLAX 2D LVIDd:         5.60 cm  Diastology LVIDs:         3.90 cm  LV e' lateral:   8.81 cm/s LV PW:         1.40 cm  LV E/e' lateral: 5.7 LV IVS:        1.10 cm  LV e' medial:    5.87 cm/s LVOT diam:     2.40 cm  LV E/e' medial:  8.5 LV SV:         64 LV SV Index:   27 LVOT Area:     4.52 cm  RIGHT VENTRICLE RV S prime:     9.79 cm/s TAPSE (M-mode): 1.3 cm LEFT ATRIUM             Index       RIGHT ATRIUM  Index LA diam:        3.10 cm 1.31 cm/m  RA Area:     18.80 cm LA Vol (A2C):   62.9 ml 26.55 ml/m RA Volume:   51.80 ml  21.86 ml/m LA Vol (A4C):   40.8 ml 17.22 ml/m LA Biplane Vol: 50.8 ml 21.44 ml/m  AORTIC VALVE LVOT Vmax:   69.80 cm/s LVOT Vmean:  49.400 cm/s LVOT VTI:    0.142 m  AORTA Ao Root diam: 3.60 cm MITRAL VALVE MV Area (PHT): 2.34 cm    SHUNTS MV Decel Time: 324 msec    Systemic VTI:  0.14 m MV E velocity: 50.10 cm/s  Systemic Diam: 2.40 cm MV A velocity: 71.60 cm/s MV E/A ratio:  0.70 Dorris Carnes MD Electronically signed by Dorris Carnes MD Signature Date/Time: 10/12/2019/1:58:41 PM    Final    ECHO TEE  Result Date: 10/24/2019    TRANSESOPHOGEAL ECHO REPORT   Patient Name:   Arie Sabina Date of Exam: 10/24/2019 Medical Rec #:  [id number]      Height:       74.0 in Accession #:    [id number]     Weight:       247.4 lb Date of Birth:  19-Jan-1971      BSA:          2.379 m Patient Age:    42 years       BP:           120/80 mmHg Patient Gender: M              HR:           100 bpm. Exam Location:  Inpatient Procedure: Transesophageal Echo Indications:    Emobli/Bacteremia  History:        Patient has prior history of Echocardiogram examinations.  Sonographer:    Clayton Lefort RDCS (AE) Referring Phys: [phone] HAO MENG PROCEDURE: The transesophogeal probe was passed without difficulty through the esophogus of the  patient. Sedation performed by different physician. The patient's vital signs; including heart rate, blood pressure, and oxygen saturation; remained stable throughout the procedure. The patient developed no complications during the procedure. IMPRESSIONS  1. Left ventricular ejection fraction, by estimation, is 50 to 55%. The left ventricle has low normal function. The left ventricle has no regional wall motion abnormalities.  2. Right ventricular systolic function is normal. The right ventricular size is normal.  3. No left atrial/left atrial appendage thrombus was detected.  4. The mitral valve is normal in structure. Trivial mitral valve regurgitation. No evidence of mitral stenosis.  5. The aortic valve is normal in structure. Aortic valve regurgitation is not visualized. No aortic stenosis is present.  6. The inferior vena cava is normal in size with greater than 50% respiratory variability, suggesting right atrial pressure of 3 mmHg. Conclusion(s)/Recommendation(s): Normal biventricular function without evidence of hemodynamically significant valvular heart disease. FINDINGS  Left Ventricle: Left ventricular ejection fraction, by estimation, is 50 to 55%. The left ventricle has low normal function. The left ventricle has no regional wall motion abnormalities. The left ventricular internal cavity size was normal in size. There is no left ventricular hypertrophy. Right Ventricle: The right ventricular size is normal. No increase in right ventricular wall thickness. Right ventricular systolic function is normal. Left Atrium: Left atrial size was normal in size. No left atrial/left atrial appendage thrombus was detected. Right Atrium: Right atrial size was normal in size. Pericardium: There is no evidence  of pericardial effusion. Mitral Valve: The mitral valve is normal in structure. Normal mobility of the mitral valve leaflets. Trivial mitral valve regurgitation. No evidence of mitral valve stenosis. There is no  evidence of mitral valve vegetation. Tricuspid Valve: The tricuspid valve is normal in structure. Tricuspid valve regurgitation is mild . No evidence of tricuspid stenosis. There is no evidence of tricuspid valve vegetation. Aortic Valve: The aortic valve is normal in structure. Aortic valve regurgitation is not visualized. No aortic stenosis is present. There is no evidence of aortic valve vegetation. Pulmonic Valve: The pulmonic valve was normal in structure. Pulmonic valve regurgitation is trivial. No evidence of pulmonic stenosis. Aorta: The aortic root is normal in size and structure. Venous: The inferior vena cava is normal in size with greater than 50% respiratory variability, suggesting right atrial pressure of 3 mmHg. IAS/Shunts: No atrial level shunt detected by color flow Doppler. Jenkins Rouge MD Electronically signed by Jenkins Rouge MD Signature Date/Time: 10/24/2019/3:28:45 PM    Final    VAS US CAROTID (at [facility] and [facility] only)  Result Date: 10/26/2019 Carotid Arterial Duplex Study Indications:       23m focus of diffusion restriction in the left                    frontoparietal lobe concerning for acute/early subacute                    infarct vs cerebritis. Risk Factors:      Hypertension, Diabetes. Other Factors:     Covid-19, bacteremia. Comparison Study:  No prior study on file Performing Technologist: CSharion DoveRVS  Examination Guidelines: A complete evaluation includes B-mode imaging, spectral Doppler, color Doppler, and power Doppler as needed of all accessible portions of each vessel. Bilateral testing is considered an integral part of a complete examination. Limited examinations for reoccurring indications may be performed as noted.  Right Carotid Findings: +----------+--------+--------+--------+------------------+--------+           PSV cm/sEDV cm/sStenosisPlaque DescriptionComments +----------+--------+--------+--------+------------------+--------+ CCA Prox  66      11                                          +----------+--------+--------+--------+------------------+--------+ CCA Distal71      18                                         +----------+--------+--------+--------+------------------+--------+ ICA Prox  58      19                                         +----------+--------+--------+--------+------------------+--------+ ICA Distal90      33                                         +----------+--------+--------+--------+------------------+--------+ ECA       114     16                                         +----------+--------+--------+--------+------------------+--------+ +----------+--------+-------+--------+-------------------+  PSV cm/sEDV cmsDescribeArm Pressure (mmHG) +----------+--------+-------+--------+-------------------+ [id number]                                        +----------+--------+-------+--------+-------------------+ +---------+--------+--+--------+--+ VertebralPSV cm/s38EDV cm/s11 +---------+--------+--+--------+--+  Left Carotid Findings: +----------+--------+--------+--------+------------------+--------+           PSV cm/sEDV cm/sStenosisPlaque DescriptionComments +----------+--------+--------+--------+------------------+--------+ CCA Prox  55      14                                         +----------+--------+--------+--------+------------------+--------+ CCA Distal65      20                                         +----------+--------+--------+--------+------------------+--------+ ICA Prox  68      20                                         +----------+--------+--------+--------+------------------+--------+ ICA Distal76      23                                         +----------+--------+--------+--------+------------------+--------+ ECA       77      3                                           +----------+--------+--------+--------+------------------+--------+ +----------+--------+--------+--------+-------------------+           PSV cm/sEDV cm/sDescribeArm Pressure (mmHG) +----------+--------+--------+--------+-------------------+ [id number]                                          +----------+--------+--------+--------+-------------------+ +---------+--------+--+--------+--+ VertebralPSV cm/s63EDV cm/s18 +---------+--------+--+--------+--+   Summary: Right Carotid: The extracranial vessels were near-normal with only minimal wall                thickening or plaque. Left Carotid: The extracranial vessels were near-normal with only minimal wall               thickening or plaque. Vertebrals:  Bilateral vertebral arteries demonstrate antegrade flow. Subclavians: Normal flow hemodynamics were seen in bilateral subclavian              arteries. *See table(s) above for measurements and observations.  Electronically signed by Antony Contras MD on 10/26/2019 at 12:37:48 PM.    Final    VAS [place] LOWER EXTREMITY ARTERIAL DUPLEX  Result Date: 10/26/2019 LOWER EXTREMITY ARTERIAL DUPLEX STUDY Indications: Ulceration, and Osteomyelitis. High Risk Factors: Hypertension, Diabetes. Other Factors: Covid-19. Charcot foot.  Current ABI: N/A Comparison Study: No prior study Performing Technologist: Sharion Dove RVS  Examination Guidelines: A complete evaluation includes B-mode imaging, spectral Doppler, color Doppler, and power Doppler as needed of all accessible portions of each vessel. Bilateral testing is considered an integral part of a complete examination. Limited examinations for reoccurring indications may be performed as noted.  +----------+--------+-----+--------+---------+--------+  LEFT      PSV cm/sRatioStenosisWaveform Comments +----------+--------+-----+--------+---------+--------+ CFA Prox  123                  triphasic          +----------+--------+-----+--------+---------+--------+ DFA       51                   triphasic         +----------+--------+-----+--------+---------+--------+ SFA Prox  97                   triphasic         +----------+--------+-----+--------+---------+--------+ SFA Mid   88                   triphasic         +----------+--------+-----+--------+---------+--------+ SFA Distal81                   triphasic         +----------+--------+-----+--------+---------+--------+ POP Prox  60                   triphasic         +----------+--------+-----+--------+---------+--------+ POP Distal86                   triphasic         +----------+--------+-----+--------+---------+--------+ ATA Distal110                  triphasic         +----------+--------+-----+--------+---------+--------+ PTA Prox  34                   triphasic         +----------+--------+-----+--------+---------+--------+ PTA Mid   29                   triphasic         +----------+--------+-----+--------+---------+--------+ PTA [id number]                  triphasic         +----------+--------+-----+--------+---------+--------+  Summary: Left: Normal waveforms and adequate flow noted.  See table(s) above for measurements and observations. Electronically signed by Servando Snare MD on 10/26/2019 at 4:50:14 PM.    Final    VAS [place] LOWER EXTREMITY ARTERIAL DUPLEX  Result Date: 10/19/2019 LOWER EXTREMITY ARTERIAL DUPLEX STUDY Indications: Cold leg, Covid-19.  Current ABI: N/A Comparison Study: No prior study Performing Technologist: Sharion Dove RVS  Examination Guidelines: A complete evaluation includes B-mode imaging, spectral Doppler, color Doppler, and power Doppler as needed of all accessible portions of each vessel. Bilateral testing is considered an integral part of a complete examination. Limited examinations for reoccurring indications may be performed as noted.   +-----------+--------+-----+--------+---------+--------+ RIGHT      PSV cm/sRatioStenosisWaveform Comments +-----------+--------+-----+--------+---------+--------+ CFA Prox   67                   triphasic         +-----------+--------+-----+--------+---------+--------+ DFA        60                   triphasic         +-----------+--------+-----+--------+---------+--------+ SFA Prox   68                   triphasic         +-----------+--------+-----+--------+---------+--------+ SFA Mid    62  triphasic         +-----------+--------+-----+--------+---------+--------+ SFA Distal 69                   triphasic         +-----------+--------+-----+--------+---------+--------+ POP Prox   47                   triphasic         +-----------+--------+-----+--------+---------+--------+ POP Distal 42                   triphasic         +-----------+--------+-----+--------+---------+--------+ ATA Distal 15                   triphasic         +-----------+--------+-----+--------+---------+--------+ PTA Prox   31                   triphasic         +-----------+--------+-----+--------+---------+--------+ PTA Mid    38                   triphasic         +-----------+--------+-----+--------+---------+--------+ PTA Distal 38                   triphasic         +-----------+--------+-----+--------+---------+--------+ PERO Distal36                   triphasic         +-----------+--------+-----+--------+---------+--------+  Summary: Right: Normal examination. No evidence of arterial occlusive disease. Normal waveforms, no stenosis.  See table(s) above for measurements and observations. Electronically signed by Monica Martinez MD on 10/19/2019 at 3:25:02 PM.    Final    VAS [place] LOWER EXTREMITY VENOUS (DVT)  Result Date: 10/26/2019  Lower Venous DVTStudy Indications: Covid-19, elevated D-Dimer.  Comparison Study: Prior  negative left lower extremity venous duplex don 03/19/17                   and is available for comparison. Performing Technologist: Sharion Dove RVS  Examination Guidelines: A complete evaluation includes B-mode imaging, spectral Doppler, color Doppler, and power Doppler as needed of all accessible portions of each vessel. Bilateral testing is considered an integral part of a complete examination. Limited examinations for reoccurring indications may be performed as noted. The reflux portion of the exam is performed with the patient in reverse Trendelenburg.  +---------+---------------+---------+-----------+----------+--------------+ RIGHT    CompressibilityPhasicitySpontaneityPropertiesThrombus Aging +---------+---------------+---------+-----------+----------+--------------+ CFV      Full           Yes      Yes                                 +---------+---------------+---------+-----------+----------+--------------+ SFJ      Full                                                        +---------+---------------+---------+-----------+----------+--------------+ FV Prox  Full                                                        +---------+---------------+---------+-----------+----------+--------------+  FV Mid   Full                                                        +---------+---------------+---------+-----------+----------+--------------+ FV DistalFull                                                        +---------+---------------+---------+-----------+----------+--------------+ PFV      Full                                                        +---------+---------------+---------+-----------+----------+--------------+ POP      Partial        Yes      Yes                  Acute          +---------+---------------+---------+-----------+----------+--------------+ PTV      Full                                                         +---------+---------------+---------+-----------+----------+--------------+ PERO     Full                                                        +---------+---------------+---------+-----------+----------+--------------+   +---------+---------------+---------+-----------+----------+--------------+ LEFT     CompressibilityPhasicitySpontaneityPropertiesThrombus Aging +---------+---------------+---------+-----------+----------+--------------+ CFV      Full           Yes      Yes                                 +---------+---------------+---------+-----------+----------+--------------+ SFJ      Full                                                        +---------+---------------+---------+-----------+----------+--------------+ FV Prox  Full                                                        +---------+---------------+---------+-----------+----------+--------------+ FV Mid   Full                                                        +---------+---------------+---------+-----------+----------+--------------+  FV DistalFull                                                        +---------+---------------+---------+-----------+----------+--------------+ PFV      Full                                                        +---------+---------------+---------+-----------+----------+--------------+ POP      Full           Yes      Yes                                 +---------+---------------+---------+-----------+----------+--------------+ PTV      Full                                                        +---------+---------------+---------+-----------+----------+--------------+ PERO     Full                                                        +---------+---------------+---------+-----------+----------+--------------+     Summary: RIGHT: - Findings consistent with acute deep vein thrombosis involving the right popliteal vein. - Ultrasound  characteristics of enlarged lymph nodes are noted in the groin.  LEFT: - There is no evidence of deep vein thrombosis in the lower extremity.  *See table(s) above for measurements and observations. Electronically signed by Servando Snare MD on 10/26/2019 at 4:50:27 PM.    Final    [place] EKG SITE RITE  Result Date: 10/12/2019 If Site Rite image not attached, placement could not be confirmed due to current cardiac rhythm.

## 2019-10-28 NOTE — Procedures (Addendum)
Patient Name: Micheal Bell  MRN: [id number]  Epilepsy Attending: Charlsie Quest  Referring Physician/Provider: Dr. [name] Duration: 10/27/2019 1145 to 10/28/2019 1145  Patient history: 49 year old male with altered mental status. EEG evaluate for seizures.  Level of alertness: Awake, asleep  AEDs during EEG study: Keppra  Technical aspects: This EEG study was done with scalp electrodes positioned according to the 10-20 International system of electrode placement. Electrical activity was acquired at a sampling rate of 500Hz  and reviewed with a high frequency filter of 70Hz  and a low frequency filter of 1Hz . EEG data were recorded continuously and digitally stored.   Description: The posterior dominant rhythm consists of 8 Hz activity of moderate voltage (25-35 uV) seen predominantly in posterior head regions, symmetric and reactive to eye opening and eye closing. Sleep was characterized by vertex waves, sleep spindles (12 to 14 Hz), maximal frontocentral region. Initially, frequent runs of sharp waves were seen in frontocentral region, maximal vertex lasting about 5 to 6 seconds consistent with brief ictal-interictal rhythmic discharges. Continuous generalized 5 to 6 Hz theta slowing as well as intermittent generalized rhythmic 2-3hz  delta slowing  was also noted. Hyperventilation and photic stimulation were not performed.     ABNORMALITY -Continuous slow, generalized - Intermittent rhythmic delta slowing, generalized -Brief ictal-interictal rhythmic discharges, bilateral frontocentral region, maximal vertex  IMPRESSION: This study initially showed evidence of potential epileptogenicity arising from bilateral frontocentral, maximal vertex region. The sharp waves are at times rhythmic consistent with brief ictal-interictal rhythmic discharges. Additionally, there is evidence of mild diffuse encephalopathy, nonspecific etiology. No seizures were seen during thi study.   EEG  appears to be improving compared to previous day.  Laketia Vicknair 

## 2019-10-28 NOTE — Progress Notes (Signed)
NEUROLOGY CONSULTATION PROGRESS NOTE   Date of service: October 28, 2019 Patient Name: Micheal Bell MRN:  [id number] DOB:  10-12-1970  Interval Hx  Notified by team of somnolence today. He is somnolent, but awakens to follow commands and then goes back to sleep.  Physical Exam    Neurologic Examination  Mental status/Cognition: More awake, greets me when entering the door. Maintains consistent eye contact. Oriented to self, place, month and year. Able to engage in a conversation. Answer questions, follow 3 step commands, digit span of 8 and able to do very simple calculations but does endorse being bad at math. Speech/language: Fluent, comprehension intact, object naming intact, repetition intact. Cranial nerves:   CN II Pupils equal and reactive to light, no VF deficits    CN III,IV,VI EOM intact, no gaze preference or deviation, no nystagmus    CN V normal sensation in V1, V2, and V3 segments bilaterally    CN VII no asymmetry, no nasolabial fold flattening    CN VIII normal hearing to speech    CN IX & X normal palatal elevation, no uvular deviation    CN XI 5/5 head turn and 5/5 shoulder shrug bilaterally    CN XII midline tongue protrusion    Motor:  Muscle bulk: normal, tone normal  Spontaneous and antigravity movement noted in BL upper and LLE. Proximal weaker than distal in BL uppers.  No antigravity movement in RLE. Able to roll RLE on the bed horizontally.  Sensation:  Light touch intact   Pin prick    Temperature    Vibration   Proprioception     Labs   Lab Results  Component Value Date   NA 132 (L) 10/28/2019   K 3.5 10/28/2019   CL 99 10/28/2019   CO2 25 10/28/2019   GLUCOSE 205 (H) 10/28/2019   BUN 8 10/28/2019   CREATININE 0.75 10/28/2019   CALCIUM 8.3 (L) 10/28/2019   ALBUMIN 2.1 (L) 10/28/2019   AST 24 10/28/2019   ALT 18 10/28/2019   ALKPHOS 92 10/28/2019   BILITOT 0.6 10/28/2019   GFRNONAA >60 10/28/2019   GFRAA >60 10/28/2019      Imaging and Diagnostic studies  MRI Brain without contrast: Examination limited by poor signal on several sequences as well as motion degradation, as described. 17 mm focus of restricted diffusion and T2/FLAIR hyperintensity within the subcortical left frontoparietal lobes. This finding is nonspecific, but given the patient's history, the primary differential considerations are acute/early subacute infarct versus cerebritis. Consider contrast-enhanced MR imaging of the brain for further evaluation of this lesion. Mild chronic small vessel ischemic disease. Paranasal sinus disease with air-fluid levels. Bilateral mastoid effusions.  CEEG: This study showed evidence of potential epileptogenicity arising from bilateral frontocentral, maximal vertex region. The sharp waves are at times rhythmic consistent with brief ictal-interictal rhythmic discharges. Additionally, there is evidence of mild diffuse encephalopathy, nonspecific etiology.  Impression   Micheal Bell is a 49 y.o. male with PMH significant for HTN, DM2, gout, IBS, firbomyalgia, obesity who is admitted with COVID Pneumonia c/b MSSA and GBS Bacteremia with negative TEE. He was found to have L frontoparietal lesion on MRI consistent with Cerebritis. Exam with RLE weakness likely due to the noted lesion.  Mentation is improved today.  Impression: - L frontoparietal Cerebritis  Recommendations  - Will keep on cEEG for another 24 hours. - continue Keppra 500mg  BID - I also ordered melatonin 3mg  qhs for delirium. ______________________________________________________________________   Thank you for  the opportunity to take part in the care of this patient. If you have any further questions, please contact the neurology consultation attending.  Signed,  [name] [facility] Pager Number [phone]

## 2019-10-29 ENCOUNTER — Inpatient Hospital Stay (HOSPITAL_COMMUNITY): Payer: Medicaid Other

## 2019-10-29 ENCOUNTER — Encounter (HOSPITAL_COMMUNITY): Payer: Self-pay | Admitting: Internal Medicine

## 2019-10-29 LAB — CBC WITH DIFFERENTIAL/PLATELET
Abs Immature Granulocytes: 0.11 10*3/uL — ABNORMAL HIGH (ref 0.00–0.07)
Basophils Absolute: 0 10*3/uL (ref 0.0–0.1)
Basophils Relative: 0 %
Eosinophils Absolute: 0.2 10*3/uL (ref 0.0–0.5)
Eosinophils Relative: 2 %
HCT: 30.8 % — ABNORMAL LOW (ref 39.0–52.0)
Hemoglobin: 9.7 g/dL — ABNORMAL LOW (ref 13.0–17.0)
Immature Granulocytes: 1 %
Lymphocytes Relative: 26 %
Lymphs Abs: 2.8 10*3/uL (ref 0.7–4.0)
MCH: 29.3 pg (ref 26.0–34.0)
MCHC: 31.5 g/dL (ref 30.0–36.0)
MCV: 93.1 fL (ref 80.0–100.0)
Monocytes Absolute: 0.6 10*3/uL (ref 0.1–1.0)
Monocytes Relative: 5 %
Neutro Abs: 6.9 10*3/uL (ref 1.7–7.7)
Neutrophils Relative %: 66 %
Platelets: 363 10*3/uL (ref 150–400)
RBC: 3.31 MIL/uL — ABNORMAL LOW (ref 4.22–5.81)
RDW: 12.4 % (ref 11.5–15.5)
WBC: 10.5 10*3/uL (ref 4.0–10.5)
nRBC: 0 % (ref 0.0–0.2)

## 2019-10-29 LAB — GLUCOSE, CAPILLARY
Glucose-Capillary: 107 mg/dL — ABNORMAL HIGH (ref 70–99)
Glucose-Capillary: 127 mg/dL — ABNORMAL HIGH (ref 70–99)
Glucose-Capillary: 190 mg/dL — ABNORMAL HIGH (ref 70–99)
Glucose-Capillary: 196 mg/dL — ABNORMAL HIGH (ref 70–99)
Glucose-Capillary: 199 mg/dL — ABNORMAL HIGH (ref 70–99)
Glucose-Capillary: 214 mg/dL — ABNORMAL HIGH (ref 70–99)
Glucose-Capillary: 250 mg/dL — ABNORMAL HIGH (ref 70–99)

## 2019-10-29 LAB — COMPREHENSIVE METABOLIC PANEL
ALT: 17 U/L (ref 0–44)
AST: 21 U/L (ref 15–41)
Albumin: 2.1 g/dL — ABNORMAL LOW (ref 3.5–5.0)
Alkaline Phosphatase: 86 U/L (ref 38–126)
Anion gap: 8 (ref 5–15)
BUN: 7 mg/dL (ref 6–20)
CO2: 26 mmol/L (ref 22–32)
Calcium: 8.5 mg/dL — ABNORMAL LOW (ref 8.9–10.3)
Chloride: 100 mmol/L (ref 98–111)
Creatinine, Ser: 0.67 mg/dL (ref 0.61–1.24)
GFR calc Af Amer: >60 mL/min (ref >60)
GFR calc non Af Amer: >60 mL/min (ref >60)
Glucose, Bld: 145 mg/dL — ABNORMAL HIGH (ref 70–99)
Potassium: 3.7 mmol/L (ref 3.5–5.1)
Sodium: 134 mmol/L — ABNORMAL LOW (ref 135–145)
Total Bilirubin: 0.8 mg/dL (ref 0.3–1.2)
Total Protein: 6.2 g/dL — ABNORMAL LOW (ref 6.5–8.1)

## 2019-10-29 LAB — C-REACTIVE PROTEIN: CRP: 8.4 mg/dL — ABNORMAL HIGH (ref <1.0)

## 2019-10-29 LAB — BRAIN NATRIURETIC PEPTIDE: B Natriuretic Peptide: 76.9 pg/mL (ref 0.0–100.0)

## 2019-10-29 LAB — MAGNESIUM: Magnesium: 1.4 mg/dL — ABNORMAL LOW (ref 1.7–2.4)

## 2019-10-29 LAB — D-DIMER, QUANTITATIVE: D-Dimer, Quant: 5.18 ug/mL-FEU — ABNORMAL HIGH (ref 0.00–0.50)

## 2019-10-29 LAB — PROCALCITONIN: Procalcitonin: <0.1 ng/mL

## 2019-10-29 IMAGING — MR MR MRA HEAD W/O CM
1 series · 20 of 48 positions shown · non-contrast
Comparison: Four days ago

CLINICAL DATA: Neuro deficit with stroke suspected

EXAM:
MRA HEAD WITHOUT CONTRAST
TECHNIQUE: Angiographic images of the Circle of Willis were obtained using MRA
technique without intravenous contrast.

[Series 3: ax (id) · axial · 1.0mm · 0.43mm/px · z∈[-74,+15]mm · 20 of 188 slices shown]
[im 1/188]
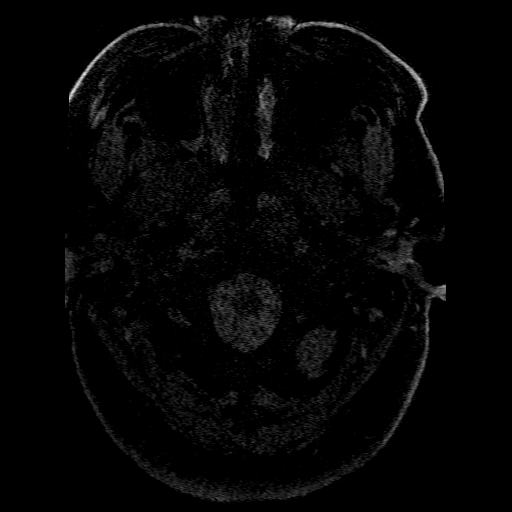
[im 4/188]
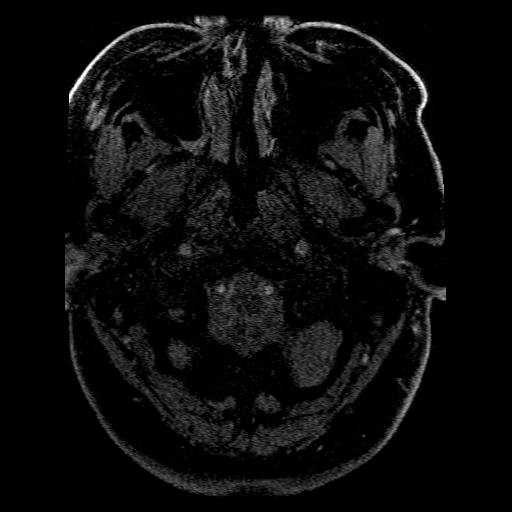
[im 8/188]
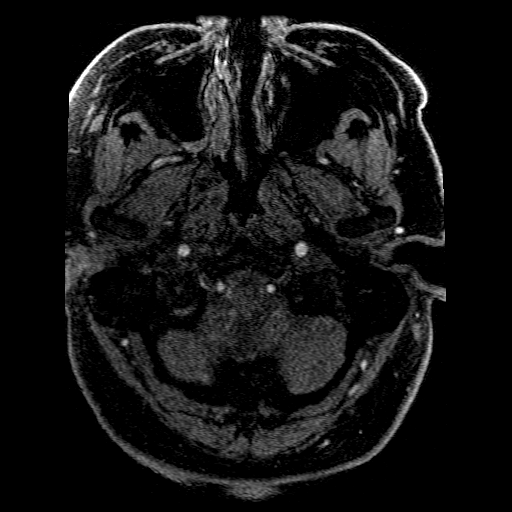
[im 12/188]
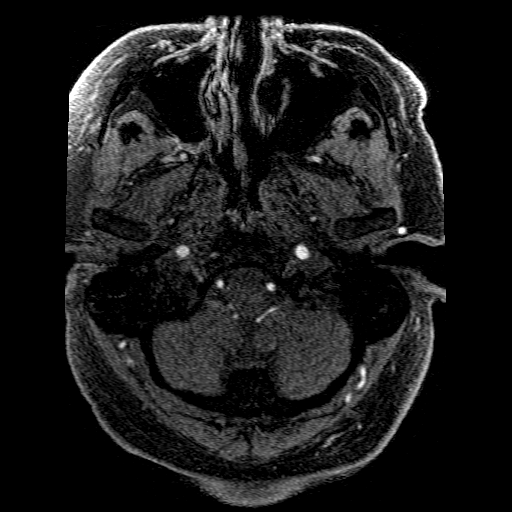
[im 16/188]
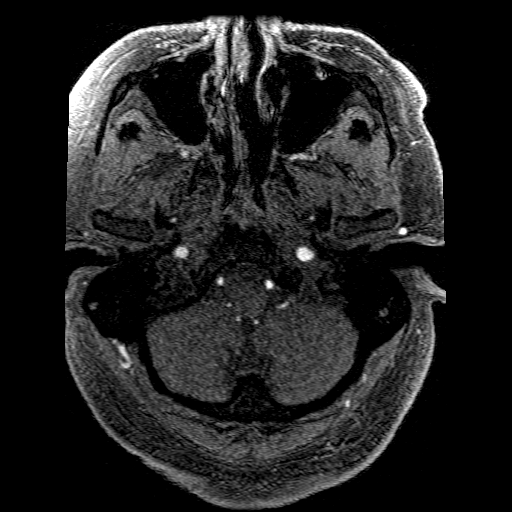
[im 20/188]
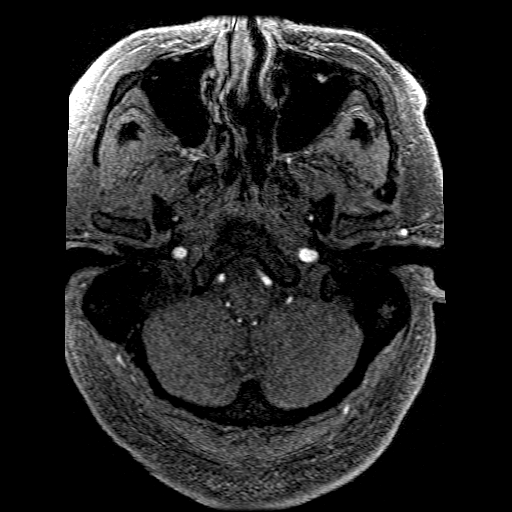
[im 24/188]
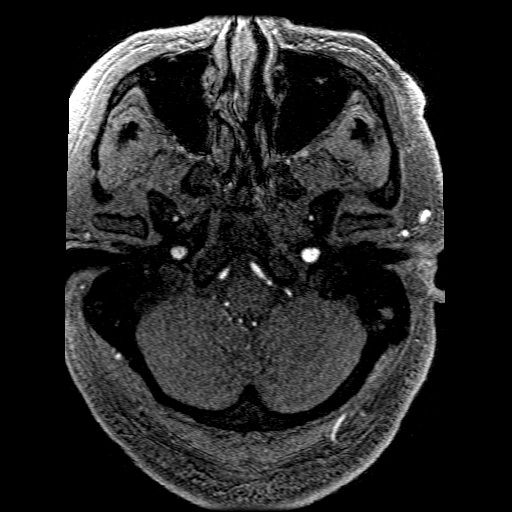
[im 28/188]
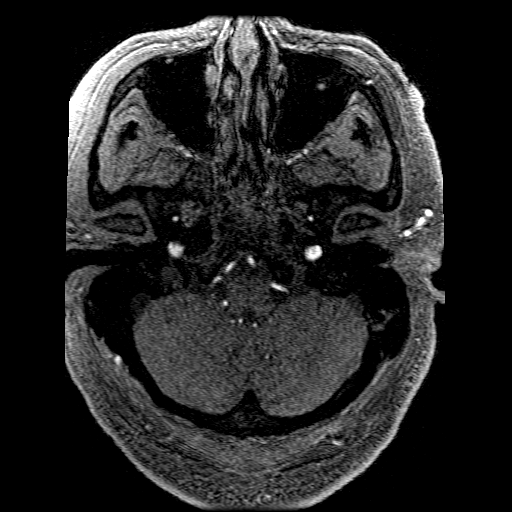
[im 32/188]
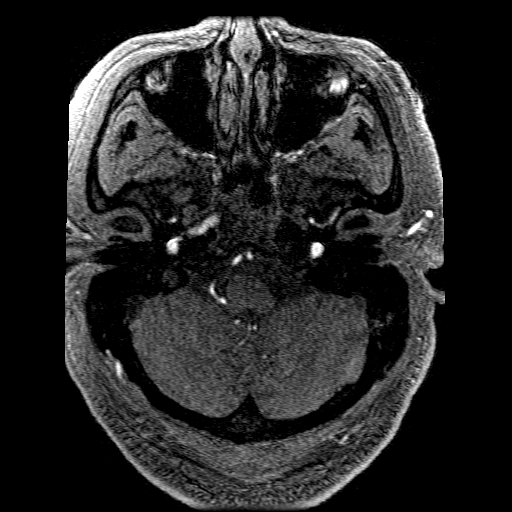
[im 36/188]
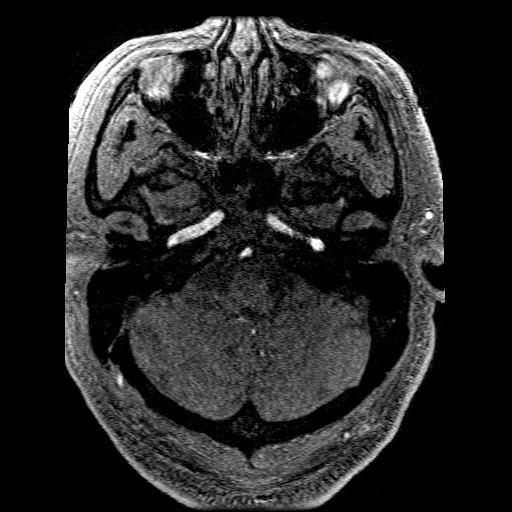
[im 40/188]
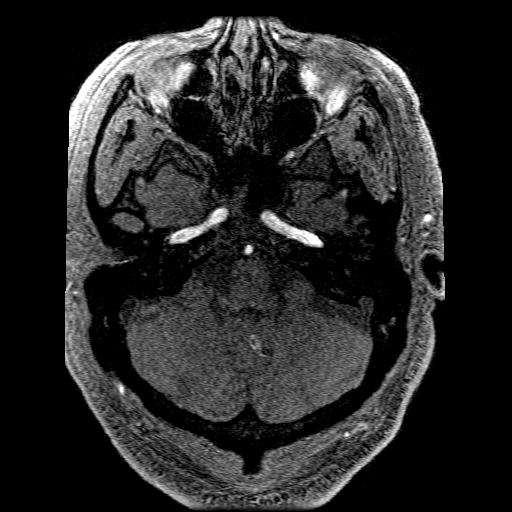
[im 44/188]
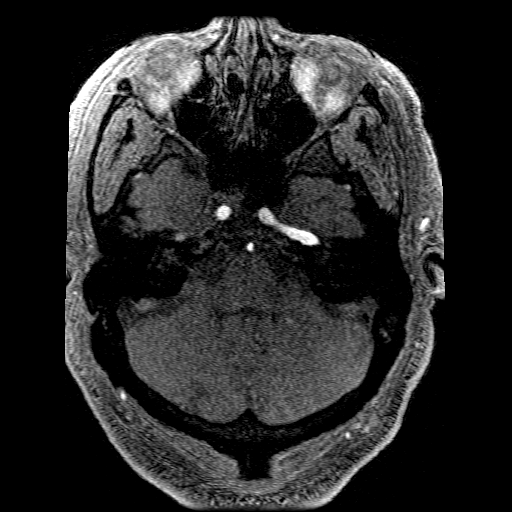
[im 60/188]
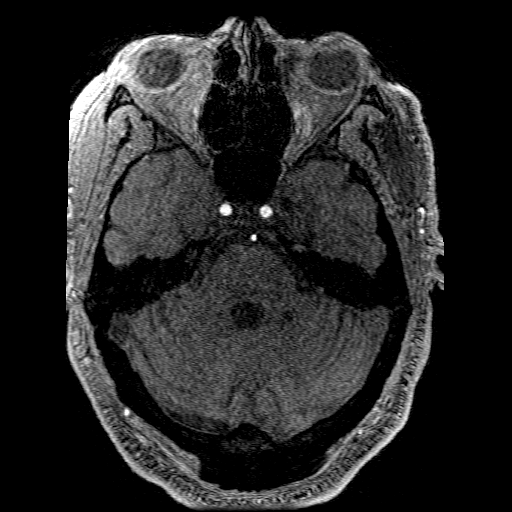
[im 84/188]
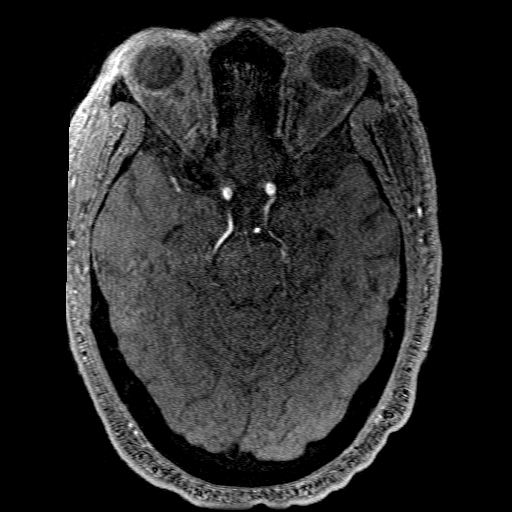
[im 96/188]
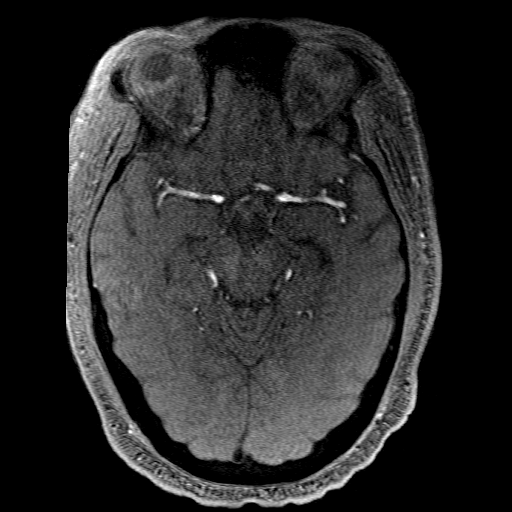
[im 108/188]
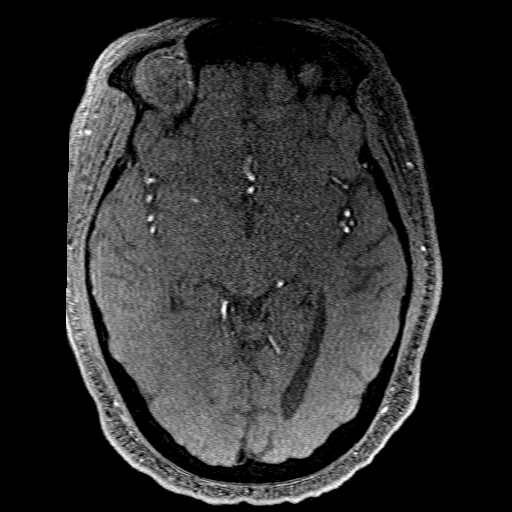
[im 132/188]
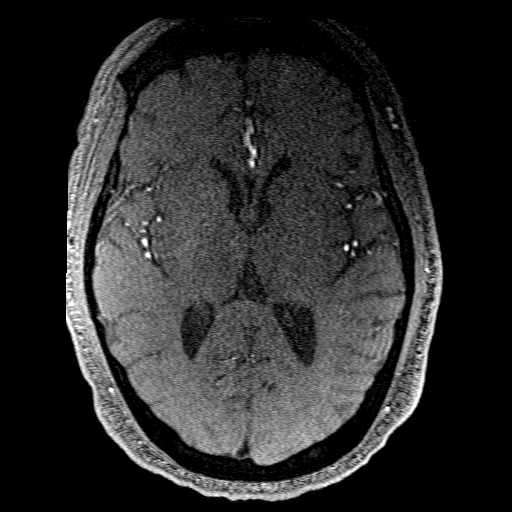
[im 156/188]
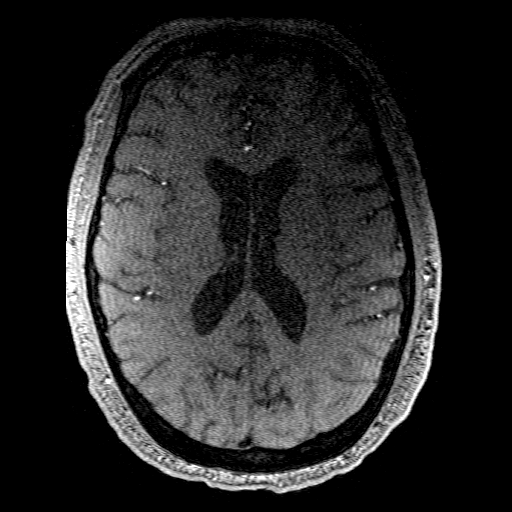
[im 160/188]
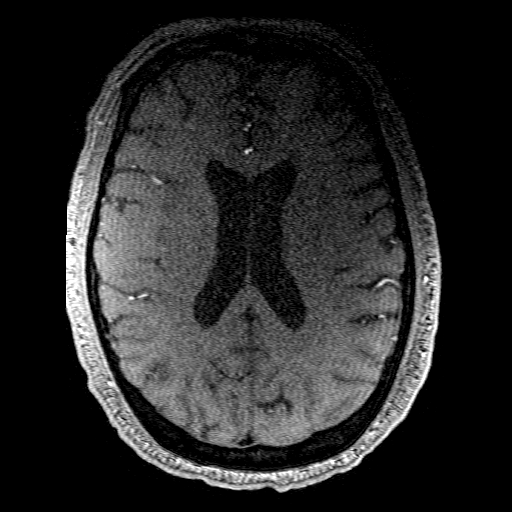
[im 180/188]
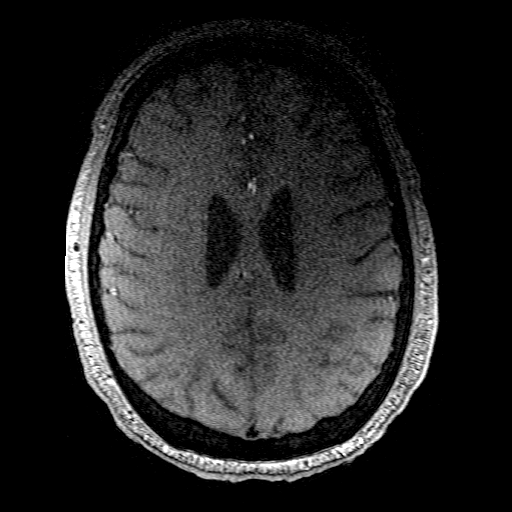

[20 of 48 positions shown; findings below may reference images not displayed]

FINDINGS: History of bacteremia and embolic infarction. No vessel beading,
stenosis, or pseudoaneurysm.
IMPRESSION: Negative intracranial MRA.

## 2019-10-29 MED ORDER — SODIUM CHLORIDE 0.9 % IV SOLN
12.0000 g | INTRAVENOUS | Status: DC
  Administered 2019-10-29 – 2019-11-14 (×16): 12 g via INTRAVENOUS
  Filled 2019-10-29 (×22): qty 12000

## 2019-10-29 MED ORDER — NAFCILLIN SODIUM 2 G IJ SOLR
12.0000 g | INTRAVENOUS | Status: DC
  Filled 2019-10-29: qty 12000

## 2019-10-29 MED ORDER — MAGNESIUM SULFATE 2 GM/50ML IV SOLN
2.0000 g | Freq: Once | INTRAVENOUS | Status: AC
  Administered 2019-10-29: 2 g via INTRAVENOUS
  Filled 2019-10-29: qty 50

## 2019-10-29 NOTE — Procedures (Addendum)
Patient Name:Micheal Bell YQM:[id number] Epilepsy Attending:[name] [name] Referring Physician/Provider:Dr. [name] Duration: 10/28/2019 1145 to 10/29/2019  [id number]  Patient history:49 year old male with altered mental status. EEG evaluate for seizures.  Level of alertness:Awake, asleep  AEDs during EEG study:Keppra  Technical aspects: This EEG study was done with scalp electrodes positioned according to the 10-20 International system of electrode placement. Electrical activity was acquired at a sampling rate of 500Hz  and reviewed with a high frequency filter of 70Hz  and a low frequency filter of 1Hz . EEG data were recorded continuously and digitally stored.   Description: The posterior dominant rhythm consists of8Hz  activity of moderate voltage (25-35 uV) seen predominantly in posterior head regions, symmetric and reactive to eye opening and eye closing. Sleep was characterized by vertex waves, sleep spindles (12 to 14 Hz), maximal frontocentral region. Continuous generalized 5 to 6 Hz theta slowing as well as intermittent generalized rhythmic 2-3hz  delta slowing  was also noted. Hyperventilation and photic stimulation were not performed.   ABNORMALITY -Continuous slow, generalized - Intermittent rhythmic delta slowing, generalized  IMPRESSION: This studyis suggestive of mild diffuse encephalopathy, nonspecific etiology. No seizures and epileptiform discharges were seen during thi study.   Micheal Bell 

## 2019-10-29 NOTE — Progress Notes (Signed)
Inpatient Rehab Admissions Coordinator Note:   Per therapy recommendations, pt was screened for CIR candidacy by Bernard Donahoo, MS CCC-SLP. At this time, Pt. Appears to have functional decline and is a good candidate for CIR. Will place order for rehab consult per protocol.  Please contact me with questions.   Micheal Cauthon, MS, CCC-SLP Rehab Admissions Coordinator  [phone] (celll) [phone] (office)  

## 2019-10-29 NOTE — Progress Notes (Signed)
PROGRESS NOTE                                                                                                                                                                                                             Patient Demographics:    Micheal Bell, is a 49 y.o. male, DOB - 03-28-1970, JEH:[id number]  Outpatient Primary MD for the patient is [name], MD    LOS - 17  Admit date - 10/09/2019    Chief Complaint  Patient presents with  . Cough       Brief Narrative - 49 yo male with hx of HTN, T2DM, gout, IBS, fibromyalgia, obesity who presented on 8/16 for COVID pneumonia.  He decompensated on 8/19 and was intubated.  Self extubated on 8/23 and was reintubated.  He's subsequently been extubated on 8/28.  He's been on typical therapies for covid including steroids, baricitinib and remdesivir.  His hospitalization was complicated by MSSA and GBS bacteria.  Also found to have systolic heart failure.  Transferred to my[facility] on 10/25/19   Subjective:   Patient in bed, appears comfortable, denies any headache, no fever, no chest pain or pressure, no shortness of breath , no abdominal pain. No focal weakness.   Assessment  & Plan :     1. Acute Hypoxic Resp. Failure due to Acute Covid 19 Viral Pneumonitis during the ongoing 2020 Covid 19 Pandemic - he had severe hypoxia and parenchymal lung injury he was intubated and admitted by ICU, he was started on IV steroids, remdesivir and Baricitinib on 10/12/2019.  He was subsequently extubated on 10/21/2019.  His stay was complicated by bacteremia after which Baricitinib was discontinued on 10/24/2019.  His pulmonary status is gradually improving.  Encouraged the patient to sit up in chair in the daytime use I-S and flutter valve for pulmonary toiletry and then prone in bed when at night.  Will advance activity and titrate down oxygen as possible.   SpO2: 94 % O2 Flow Rate  (L/min): 2 L/min FiO2 (%): 97 %  Recent Labs  Lab 10/23/19 1206 10/23/19 1659 10/24/19 0130 10/25/19 0352 10/25/19 0353 10/25/19 1600 10/26/19 0402 10/27/19 0500 10/28/19 0453 10/28/19 0454 10/29/19 0409 10/29/19 0410  WBC  --   --    < > 15.5*  --   --  14.3* 13.1* 10.7*  --  10.5  --   PLT  --   --    < > 506*  --   --  396 339 372  --  363  --   CRP  --   --    < > 3.7*  --   --  5.4* 9.2* 8.8*  --  8.4*  --   BNP  --   --   --   --  34.4  --  38.0 54.1  --  46.0  --  76.9  DDIMER  --   --    < > 5.12*  --   --  6.29* 5.77* 5.74*  --  5.18*  --   PROCALCITON  --   --    < >  --   --  <0.10 <0.10 0.13 <0.10  --  <0.10  --   AST  --   --    < > 30  --   --  _0 --  21  --   ALT  --   --    < > 25  --   --  _1 --  17  --   ALKPHOS  --   --    < > 105  --   --  89 93 92  --  86  --   BILITOT  --   --    < > 0.4  --   --  1.2 1.0 0.6  --  0.8  --   ALBUMIN  --   --    < > 2.5*  --   --  2.3* 2.2* 2.1*  --  2.1*  --   INR  --   --   --   --   --  1.2  --   --   --   --   --   --   LATICACIDVEN 2.3* 1.1  --   --   --   --   --   --   --   --   --   --    < > = values in this interval not displayed.    2.  Sepsis with MSSA and Group B strep bacteremia.  ID following, was on IV Ancef but switch to nafcillin on 10/26/2019.  Clean TEE, history of Charcot joints, peripheral neuropathy and recent left foot soft tissue injury, MRI left foot shows an abscess, Ortho has been consulted and they recommended medical management with outpatient follow-up with Dr. Sharol Given post discharge, also has osteomyelitis, defer duration of treatment with antibiotics to ID.  3.  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 Also now evidence of cerebritis on MRI along with ongoing seizures on EEG.  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  For seizures he has been started on Keppra with good effect and his mentation has improved considerably. Continue PT OT and speech.  Neuro following.   3. Possible systolic heart failure as shown by TTE however this could have been transient due to sepsis, on repeat TEE his EF is now normalized to 50 to 55%.  Supportive care.  Cardiology on board currently blood pressure soft and he is tachycardic, likely dehydrated due to poor oral intake, hold ARB, increase beta-blocker  dose, hydrated with IV fluids and monitor.   4.  Cyanotic right lower extremity toes.  Likely due to hypoperfusion which is transient, arterial ultrasound stable.  5.  History of chronic pain.  On multiple narcotics, Flexeril (low dose to prevent withdrawal) and high-dose benzodiazepines.  Hold all due to metabolic encephalopathy which is severe.  6. HTN -stable on combination of ARB and beta-blocker.   7.  Elevated D-dimer, positive acute DVT in the right lower extremity.  Switched to full dose Lovenox.   8.  DM type II.  Currently on Lantus and sliding scale monitor.  Lab Results  Component Value Date   HGBA1C 11.3 (H) 10/26/2019   CBG (last 3)  Recent Labs    10/29/19 0018 10/29/19 0440 10/29/19 0714  GLUCAP 190* 127* 107*      Condition -   Guarded  Family Communication  :  Sister and mother [phone] - 10/25/19, 10/27/19, 10/28/19  Code Status :  Full  Consults  :  PCCM, ID, Cards  Procedures  :    EEG - This study showed evidence of potential epileptogenicity arising from bilateral frontocentral, maximal vertex region. The sharp waves are at times rhythmic consistent with brief ictal-interictal rhythmic discharges. Additionally, there is evidence of mild diffuse encephalopathy, nonspecific etiology.  Bilateral lower extremity venous ultrasound - Acute right lower extremity popliteal vein DVT.  Carotid duplex.  Nonacute.    MRI - Peripherally enhancing lesion at  the base of the left precentral gyrus, favored to indicate cerebritis in this bacteremic patient.  CT Head -  1. No acute intracranial abnormality. 2. Area of subcortical low-density involving the posterior left frontal lobe at the convexity, likely encephalomalacia and sequela of remote infarct. There are no prior exams for comparison to establish chronicity. MRI could be considered for further evaluation based on clinical concern. 3. Paranasal sinus fluid levels on opacification of the right greater than left mastoid air cells, possibly related to intubation.  R. Leg Arterial US -  No evidence of arterial occlusive disease. Normal waveforms, no stenosis  TTE -  1. Extremely poor acoustic windows. LVEF appears to be depressed with diffuse hypokinesis, worse in the mid/distal inferior/inferoseptal walls. . Left ventricular ejection fraction, by estimation, is 30 to 35%. The left ventricle has moderately decreased function. The left ventricular internal cavity size was mildly dilated. Left ventricular diastolic parameters are indeterminate.  2. Right ventricular systolic function is moderately reduced. The right ventricular size is mildly enlarged.  3. The mitral valve is normal in structure. Trivial mitral valve regurgitation.  4. The aortic valve is normal in structure. Aortic valve regurgitation is not visualized  TEE - 1. Left ventricular ejection fraction, by estimation, is 50 to 55%. The left ventricle has low normal function. The left ventricle has no regional wall motion abnormalities.  2. Right ventricular systolic function is normal. The right ventricular size is normal.  3. No left atrial/left atrial appendage thrombus was detected.  4. The mitral valve is normal in structure. Trivial mitral valve regurgitation. No evidence of mitral stenosis.  5. The aortic valve is normal in structure. Aortic valve regurgitation is not visualized. No aortic stenosis is present.  6. The inferior vena cava is  normal in size with greater than 50% respiratory variability, suggesting right atrial pressure of 3 mmHg.  CT - 1. No acute intracranial abnormality. 2. Area of subcortical low-density involving the posterior left frontal lobe at the convexity, likely encephalomalacia and sequela of remote infarct. There  are no prior exams for comparison to establish chronicity. MRI could be considered for further evaluation based on clinical concern. 3. Paranasal sinus fluid levels on opacification of the right greater than left mastoid air cells, possibly related to intubation    PUD Prophylaxis : PPI  Disposition Plan  :    Status is: Inpatient  Remains inpatient appropriate because:IV treatments appropriate due to intensity of illness or inability to take PO   Dispo: The patient is from: Home              Anticipated d/c is to: Home              Anticipated d/c date is: 3 days              Patient currently is not medically stable to d/c.   DVT Prophylaxis  :  Lovenox    Lab Results  Component Value Date   PLT 363 10/29/2019    Diet :  Diet Order            Diet heart healthy/carb modified Room service appropriate? No; Fluid consistency: Thin  Diet effective now                  Inpatient Medications  Scheduled Meds: . (feeding supplement) PROSource Plus  30 mL Oral BID BM  . alteplase  2 mg Intracatheter Once  . atorvastatin  40 mg Oral Daily  . buPROPion  150 mg Oral Daily  . chlorhexidine gluconate (MEDLINE KIT)  15 mL Mouth Rinse BID  . Chlorhexidine Gluconate Cloth  6 each Topical Daily  . docusate sodium  100 mg Oral BID  . enoxaparin (LOVENOX) injection  120 mg Subcutaneous Q12H  . feeding supplement (ENSURE ENLIVE)  237 mL Oral TID BM  . insulin aspart  0-15 Units Subcutaneous Q4H  . insulin glargine  20 Units Subcutaneous BID  . linagliptin  5 mg Oral Daily  . melatonin  3 mg Oral QHS  . metoprolol tartrate  50 mg Oral BID  . pantoprazole  40 mg Oral Daily  .  polyethylene glycol  17 g Oral Daily  . predniSONE  10 mg Oral Q breakfast  . senna  2 tablet Oral BID  . sodium chloride flush  10-40 mL Intracatheter Q12H   Continuous Infusions: . sodium chloride 10 mL/hr at 10/29/19 0600  . levETIRAcetam 500 mg (10/29/19 0828)  . magnesium sulfate bolus IVPB 2 g (10/29/19 0950)  . nafcillin (NAFCIL) continuous infusion     PRN Meds:.sodium chloride, bisacodyl, ipratropium-albuterol  Antibiotics  :    Anti-infectives (From admission, onward)   Start     Dose/Rate Route Frequency Ordered Stop   10/29/19 1415  nafcillin 12 g in dextrose 5 % 500 mL IVPB  Status:  Discontinued        12 g 20.8 mL/hr over 24 Hours Intravenous Every 24 hours 10/29/19 0434 10/29/19 0507   10/29/19 1415  nafcillin 12 g in sodium chloride 0.9 % 500 mL continuous infusion        12 g 20.8 mL/hr over 24 Hours Intravenous Every 24 hours 10/29/19 0507     10/26/19 1415  nafcillin 12 g in dextrose 5 % 500 mL IVPB  Status:  Discontinued        12 g 20.8 mL/hr over 24 Hours Intravenous Every 24 hours 10/26/19 1414 10/29/19 0434   10/26/19 1400  nafcillin injection 12 g  Status:  Discontinued  12 g Intravenous Daily 10/26/19 1238 10/26/19 1242   10/26/19 1400  nafcillin 12 g in dextrose 5 % 50 mL IVPB  Status:  Discontinued        12 g 100 mL/hr over 30 Minutes Intravenous Every 24 hours 10/26/19 1242 10/26/19 1339   10/26/19 1400  nafcillin 12 g in dextrose 5 % 500 mL IVPB  Status:  Discontinued        12 g 20.8 mL/hr over 24 Hours Intravenous Every 24 hours 10/26/19 1337 10/26/19 1416   10/26/19 1200  nafcillin 2 g in sodium chloride 0.9 % 100 mL IVPB  Status:  Discontinued        2 g 200 mL/hr over 30 Minutes Intravenous Every 4 hours 10/26/19 1010 10/26/19 1238   10/12/19 2200  cefTRIAXone (ROCEPHIN) 2 g in sodium chloride 0.9 % 100 mL IVPB  Status:  Discontinued        2 g 200 mL/hr over 30 Minutes Intravenous Every 24 hours 10/12/19 0441 10/12/19 0921   10/12/19  0930  ceFAZolin (ANCEF) IVPB 2g/100 mL premix  Status:  Discontinued        2 g 200 mL/hr over 30 Minutes Intravenous Every 8 hours 10/12/19 0921 10/26/19 1010   10/11/19 1000  remdesivir 100 mg in sodium chloride 0.9 % 100 mL IVPB       "Followed by" Linked Group Details   100 mg 200 mL/hr over 30 Minutes Intravenous Daily 10/10/19 0050 10/14/19 0956   10/11/19 0800  vancomycin (VANCOCIN) IVPB 1000 mg/200 mL premix  Status:  Discontinued        1,000 mg 200 mL/hr over 60 Minutes Intravenous Every 8 hours 10/10/19 2239 10/12/19 0921   10/10/19 2230  vancomycin (VANCOCIN) IVPB 1000 mg/200 mL premix        1,000 mg 200 mL/hr over 60 Minutes Intravenous Every 1 hr x 2 10/10/19 2208 10/11/19 0052   10/10/19 2215  cefTRIAXone (ROCEPHIN) 2 g in sodium chloride 0.9 % 100 mL IVPB  Status:  Discontinued        2 g 200 mL/hr over 30 Minutes Intravenous Every 24 hours 10/10/19 2205 10/12/19 0441   10/10/19 0100  remdesivir 100 mg in sodium chloride 0.9 % 100 mL IVPB       "Followed by" Linked Group Details   100 mg 200 mL/hr over 30 Minutes Intravenous Every 30 min 10/10/19 0050 10/10/19 0332       Time Spent in minutes  30   Lala Lund M.D on 10/29/2019 at 10:19 AM  To page go to [email or web address] - password [facility]  [facility] -  Office  [phone]   See all Orders from today for further details    Objective:   Vitals:   10/29/19 0000 10/29/19 0400 10/29/19 0500 10/29/19 0716  BP: 136/68 116/72    Pulse: 78 79    Resp: _0 Temp: 98 F (36.7 C) 98 F (36.7 C)  97.8 F (36.6 C)  TempSrc: Oral Oral  Oral  SpO2: 96% 94%    Weight:   115.5 kg   Height:        Wt Readings from Last 3 Encounters:  10/29/19 115.5 kg  09/11/19 111.1 kg  05/12/17 (!) 145.2 kg     Intake/Output Summary (Last 24 hours) at 10/29/2019 1019 Last data filed at 10/29/2019 1010 Gross per 24 hour  Intake 2350.92 ml  Output 950 ml  Net 1400.92 ml  Physical Exam  Awake & alert,  chronic right-sided weakness, deltoid weakness in both extremities,  .AT,PERRAL Supple Neck,No JVD, No cervical lymphadenopathy appriciated.  Symmetrical Chest wall movement, Good air movement bilaterally, CTAB RRR,No Gallops, Rubs or new Murmurs, No Parasternal Heave +ve B.Sounds, Abd Soft, No tenderness, No organomegaly appriciated, No rebound - guarding or rigidity. Mild cyanosis of the right toes, both ankles under bandage,    Data Review:    CBC Recent Labs  Lab 10/25/19 0352 10/26/19 0402 10/27/19 0500 10/28/19 0453 10/29/19 0409  WBC 15.5* 14.3* 13.1* 10.7* 10.5  HGB 13.2 11.2* 10.4* 10.6* 9.7*  HCT 40.1 34.3* 31.5* 32.2* 30.8*  PLT 506* 396 339 372 363  MCV 94.1 93.7 92.4 93.6 93.1  MCH 31.0 30.6 30.5 30.8 29.3  MCHC 32.9 32.7 33.0 32.9 31.5  RDW 12.4 12.3 12.3 12.5 12.4  LYMPHSABS 2.4 2.4 2.2 1.9 2.8  MONOABS 0.8 0.8 0.7 0.5 0.6  EOSABS 0.1 0.1 0.1 0.1 0.2  BASOSABS 0.1 0.1 0.0 0.1 0.0    Recent Labs  Lab 10/23/19 1206 10/23/19 1659 10/24/19 0130 10/25/19 0352 10/25/19 0353 10/25/19 1600 10/26/19 0402 10/26/19 0439 10/27/19 0500 10/28/19 0453 10/28/19 0454 10/29/19 0409 10/29/19 0410  NA  --   --    < > 141  --   --  134*  --  135 132*  --  134*  --   K  --   --    < > 3.3*  --   --  3.5  --  3.5 3.5  --  3.7  --   CL  --   --    < > 102  --   --  101  --  102 99  --  100  --   CO2  --   --    < > 27  --   --  24  --  25 25  --  26  --   GLUCOSE  --   --    < > 100*  --   --  111*  --  134* 205*  --  145*  --   BUN  --   --    < > 17  --   --  12  --  11 8  --  7  --   CREATININE  --   --    < > 0.69  --   --  0.75  --  0.73 0.75  --  0.67  --   CALCIUM  --   --    < > 9.1  --   --  8.6*  --  8.5* 8.3*  --  8.5*  --   AST  --   --    < > 30  --   --  23  --  28 24  --  21  --   ALT  --   --    < > 25  --   --  16  --  17 18  --  17  --   ALKPHOS  --   --    < > 105  --   --  89  --  93 92  --  86  --   BILITOT  --   --    < > 0.4  --   --  1.2   --  1.0 0.6  --  0.8  --   ALBUMIN  --   --    < >  2.5*  --   --  2.3*  --  2.2* 2.1*  --  2.1*  --   MG  --   --    < > 1.7  --   --  1.5*  --  1.7 1.4*  --  1.4*  --   CRP  --   --    < > 3.7*  --   --  5.4*  --  9.2* 8.8*  --  8.4*  --   DDIMER  --   --    < > 5.12*  --   --  6.29*  --  5.77* 5.74*  --  5.18*  --   PROCALCITON  --   --    < >  --   --  <0.10 <0.10  --  0.13 <0.10  --  <0.10  --   LATICACIDVEN 2.3* 1.1  --   --   --   --   --   --   --   --   --   --   --   INR  --   --   --   --   --  1.2  --   --   --   --   --   --   --   TSH 1.422  --   --   --   --   --   --  4.137  --   --   --   --   --   HGBA1C  --   --   --   --   --   --  11.3*  --   --   --   --   --   --   AMMONIA 22  --   --   --   --   --   --   --   --   --   --   --   --   BNP  --   --   --   --  34.4  --  38.0  --  54.1  --  46.0  --  76.9   < > = values in this interval not displayed.    ------------------------------------------------------------------------------------------------------------------ No results for input(s): CHOL, HDL, LDLCALC, TRIG, CHOLHDL, LDLDIRECT in the last 72 hours.  Lab Results  Component Value Date   HGBA1C 11.3 (H) 10/26/2019   ------------------------------------------------------------------------------------------------------------------ No results for input(s): TSH, T4TOTAL, T3FREE, THYROIDAB in the last 72 hours.  Invalid input(s): FREET3  Cardiac Enzymes No results for input(s): CKMB, TROPONINI, MYOGLOBIN in the last 168 hours.  Invalid input(s): CK ------------------------------------------------------------------------------------------------------------------    Component Value Date/Time   BNP 76.9 10/29/2019 0410    Micro Results Recent Results (from the past 240 hour(s))  Culture, blood (routine x 2)     Status: None   Collection Time: 10/23/19 10:58 AM   Specimen: BLOOD  Result Value Ref Range Status   Specimen Description BLOOD LEFT ANTECUBITAL   Final   Special Requests   Final    BOTTLES DRAWN AEROBIC ONLY Blood Culture adequate volume   Culture   Final    NO GROWTH 5 DAYS Performed at [facility], [address] [place], [place] [address]    Report Status 10/28/2019 FINAL  Final  Culture, blood (routine x 2)     Status: None   Collection Time: 10/23/19 11:00 AM   Specimen: BLOOD LEFT HAND  Result Value Ref Range Status  Specimen Description BLOOD LEFT HAND  Final   Special Requests   Final    BOTTLES DRAWN AEROBIC ONLY Blood Culture adequate volume   Culture   Final    NO GROWTH 5 DAYS Performed at [facility], [address], [place], [place] [address]    Report Status 10/28/2019 FINAL  Final    Radiology Reports CT HEAD WO CONTRAST  Result Date: 10/19/2019 CLINICAL DATA:  Deliriums.  Mental status change. EXAM: CT HEAD WITHOUT CONTRAST TECHNIQUE: Contiguous axial images were obtained from the base of the skull through the vertex without intravenous contrast. COMPARISON:  None. FINDINGS: Brain: Area subcortical low-density involving the posterior left frontal lobe at the convexity, series 3, image 25 and series 5, image 49, likely encephalomalacia and sequela of remote infarct, however nonspecific. No hemorrhage. No evidence of acute ischemia. No hydrocephalus, midline shift or mass effect. No subdural or extra-axial collection. Vascular: Atherosclerosis of skullbase vasculature without hyperdense vessel or abnormal calcification. Skull: No fracture or focal lesion. Sinuses/Orbits: Nasogastric tube in place. Fluid levels in the sphenoid sinuses and right maxillary sinus with scattered opacification of ethmoid air cells may be related to intubation. Opacification of the right greater than left mastoid air cells. Other: None. IMPRESSION: 1. No acute intracranial abnormality. 2. Area of subcortical low-density involving the posterior left frontal lobe at the convexity, likely encephalomalacia and sequela of remote  infarct. There are no prior exams for comparison to establish chronicity. MRI could be considered for further evaluation based on clinical concern. 3. Paranasal sinus fluid levels on opacification of the right greater than left mastoid air cells, possibly related to intubation. Electronically Signed   By: Keith Rake M.D.   On: 10/19/2019 15:30   CT ANGIO CHEST PE W OR WO CONTRAST  Result Date: 10/23/2019 CLINICAL DATA:  COVID pneumonia, acute hypoxic respiratory failure, positive D-dimer EXAM: CT ANGIOGRAPHY CHEST WITH CONTRAST TECHNIQUE: Multidetector CT imaging of the chest was performed using the standard protocol during bolus administration of intravenous contrast. Multiplanar CT image reconstructions and MIPs were obtained to evaluate the vascular anatomy. CONTRAST:  21m OMNIPAQUE IOHEXOL 350 MG/ML SOLN COMPARISON:  None. FINDINGS: Cardiovascular: Satisfactory opacification of the pulmonary arteries to the segmental level. No evidence of pulmonary embolism. The central pulmonary arteries are of normal caliber. There is moderate coronary artery calcification. Normal heart size. No pericardial effusion. The thoracic aorta is unremarkable save for bovine arch anatomy. Right upper extremity PICC line tip is seen within the superior vena cava. Mediastinum/Nodes: No enlarged mediastinal, hilar, or axillary lymph nodes. Thyroid gland, trachea, and esophagus demonstrate no significant findings. Nasoenteric feeding tube extends into the upper abdomen beyond the margin of the examination. Lungs/Pleura: Lung volumes are small. Evaluation is limited by motion artifact, however, multifocal pulmonary infiltrates are identified demonstrating a peripheral and basal predominance, likely infectious or inflammatory in the acute setting. No central obstructing lesion. No pneumothorax or pleural effusion. No superimposed interstitial edema. Upper Abdomen: No acute abnormality. Musculoskeletal: No chest wall abnormality.  No acute or significant osseous findings. Review of the MIP images confirms the above findings. IMPRESSION: 1. No evidence of pulmonary embolism. 2. Multifocal pulmonary infiltrates are identified demonstrating a peripheral and basal predominance, likely infectious or inflammatory in the acute setting. 3. Moderate coronary artery calcification. Electronically Signed   By: AFidela SalisburyMD   On: 10/23/2019 16:25   MR BRAIN WO CONTRAST  Result Date: 10/25/2019 CLINICAL DATA:  Neuro deficit, acute, stroke suspected. Additional history provided:  COVID positive. Additional history obtained from [place] with MSSA and group B strep bacteremia. EXAM: MRI HEAD WITHOUT CONTRAST TECHNIQUE: Multiplanar, multiecho pulse sequences of the brain and surrounding structures were obtained without intravenous contrast. COMPARISON:  Head CT 10/19/2019. FINDINGS: Brain: The examination is limited by poor signal on several sequences as well as motion degradation. Most notably, there is moderate motion degradation of the axial T2 weighted sequence, severe motion degradation of the axial SWI sequence and moderate motion degradation of the coronal T2 weighted sequence. Cerebral volume is normal for age. There is a 17 mm focus of restricted diffusion and corresponding T2/FLAIR hyperintensity within the subcortical left frontoparietal lobes (for instance as seen on series 2, image 39) (series 3, image 17). Additional mild scattered T2/FLAIR hyperintensity within the cerebral white matter and pons is nonspecific, but consistent with chronic small vessel ischemic disease. Severe motion degradation of the axial SWI sequence precludes adequate evaluation for intracranial blood products. No extra-axial fluid collection. No midline shift. Vascular: Expected proximal arterial flow voids. Skull and upper cervical spine: No focal marrow lesion is identified within described limitations. Sinuses/Orbits: Visualized orbits  show no acute finding. Air-fluid levels within the sphenoid and right maxillary sinuses. Partial opacification of right ethmoid air cells. Bilateral mastoid effusions. These results will be called to the ordering clinician or representative by the Radiologist Assistant, and communication documented in the PACS or [employer]. IMPRESSION: Examination limited by poor signal on several sequences as well as motion degradation, as described. 17 mm focus of restricted diffusion and T2/FLAIR hyperintensity within the subcortical left frontoparietal lobes. This finding is nonspecific, but given the patient's history, the primary differential considerations are acute/early subacute infarct versus cerebritis. Consider contrast-enhanced MR imaging of the brain for further evaluation of this lesion. Mild chronic small vessel ischemic disease. Paranasal sinus disease with air-fluid levels. Bilateral mastoid effusions. Electronically Signed   By: Kellie Simmering DO   On: 10/25/2019 15:16   MR BRAIN W CONTRAST  Result Date: 10/25/2019 CLINICAL DATA:  Brain mass follow-up EXAM: MRI HEAD WITH CONTRAST TECHNIQUE: Multiplanar, multiecho pulse sequences of the brain and surrounding structures were obtained with intravenous contrast. CONTRAST:  98m GADAVIST GADOBUTROL 1 MMOL/ML IV SOLN COMPARISON:  Brain MRI without contrast 10/25/2019 FINDINGS: Brain: There is a peripherally enhancing lesion at the base of the left precentral gyrus that measures 0.9 x 0.7 x 1.8 cm. There are no other areas of abnormal contrast enhancement. 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 Electronically Signed   By: KUlyses JarredM.D.   On: 10/25/2019 22:47   MR FOOT LEFT WO CONTRAST  Result Date: 10/25/2019 CLINICAL DATA:  Bacteremia left foot pain and diabetic EXAM: MRI OF THE LEFT FOOT WITHOUT CONTRAST TECHNIQUE: Multiplanar, multisequence MR  imaging of the left was performed. No intravenous contrast was administered. COMPARISON:  October 23, 2019 FINDINGS: Bones/Joint/Cartilage Mildly angulated nondisplaced fractures of the midshaft of the fourth and fifth metatarsals are noted. There is periosteal reaction seen along the medial margins of the metatarsal shafts. Increased heterogeneous T2 hyperintense signal with subtle T1 hypointensity seen at the base of the fourth and fifth metatarsals. No other osseous marrow signal abnormality is seen. There is no large knee joint effusions noted. The articular surfaces appear to be maintained. Ligaments The Lisfranc ligament is intact. Muscles and Tendons Increased feathery signal with atrophy is seen throughout the muscles of the forefoot. There  is a small amount of fluid seen surrounding the posterior tibialis tendon. The remainder of the flexor and extensor tendons are intact. Soft tissues Lateral plantar surface of the forefoot overlying the fifth metatarsal base there is a focal area of ulceration measuring approximately 8 mm in transverse dimension. A fluid-filled sinus tract is seen extending to the overlying osseous surface. There is a multilocular fluid collection which extends around the dorsal surface of the fifth metatarsal measuring approximately 5.4 x 0.9 x 3.4 cm. There is extensive dorsal and lateral subcutaneous edema and skin thickening. IMPRESSION: Superficial area of ulceration over the plantar lateral aspect of the fifth metatarsal base with a fluid-filled sinus tract and loculated probable abscess extending over the dorsal surface of the fifth metatarsal measuring 5.4 x 0.9 by is a 3.4 cm. Incomplete mildly angulated fourth and fifth metatarsal shaft fractures with periosteal reaction Findings which could be suggestive of reactive marrow versus early osteomyelitis involving the base of the fourth and fifth metatarsals. Electronically Signed   By: Prudencio Pair M.D.   On: 10/25/2019 19:06   DG  Chest Port 1 View  Result Date: 10/26/2019 CLINICAL DATA:  Short of breath.  COVID positive EXAM: PORTABLE CHEST 1 VIEW COMPARISON:  10/25/2019 FINDINGS: Hypoventilation with decreased lung volume. Mild bibasilar airspace disease unchanged. No new infiltrate or effusion. Right arm PICC tip in the SVC unchanged. IMPRESSION: Hypoventilation with bibasilar mild airspace disease unchanged. Electronically Signed   By: Franchot Gallo M.D.   On: 10/26/2019 16:10   DG Chest Port 1 View  Result Date: 10/25/2019 CLINICAL DATA:  Shortness of breath. EXAM: PORTABLE CHEST 1 VIEW COMPARISON:  October 16, 2019. FINDINGS: The heart size and mediastinal contours are within normal limits. Hypoinflation of the lungs is noted with mild bibasilar subsegmental atelectasis. Endotracheal tube has been removed. Right-sided PICC line is unchanged in position. The visualized skeletal structures are unremarkable. IMPRESSION: Hypoinflation of the lungs with mild bibasilar subsegmental atelectasis. Electronically Signed   By: Marijo Conception M.D.   On: 10/25/2019 08:37   DG CHEST PORT 1 VIEW  Result Date: 10/16/2019 CLINICAL DATA:  Status post intubation. EXAM: PORTABLE CHEST 1 VIEW COMPARISON:  10/14/2019 FINDINGS: ET tube tip is above the carina. There is a right arm PICC line with tip at the cavoatrial junction. Lung volumes are low. Similar appearance of bilateral pulmonary opacities compatible with multifocal pneumonia. IMPRESSION: 1. Stable support apparatus. 2. Persistent bilateral pulmonary opacities compatible with multifocal pneumonia. Electronically Signed   By: Kerby Moors M.D.   On: 10/16/2019 06:14   DG Chest Port 1 View  Result Date: 10/15/2019 CLINICAL DATA:  49 year old male with history of COVID infection. EXAM: PORTABLE CHEST 1 VIEW COMPARISON:  Chest x-ray 10/14/2019. FINDINGS: An endotracheal tube is in place with tip 3.6 cm above the carina. A nasogastric tube is seen extending into the stomach, however, the  tip of the nasogastric tube extends below the lower margin of the image. There is a right upper extremity PICC with tip terminating in the right atrium. Lung volumes are low. Widespread patchy areas of interstitial prominence an ill-defined airspace disease again noted throughout the lungs bilaterally. Overall, aeration is essentially unchanged. No pleural effusions. No evidence of pulmonary edema. No pneumothorax. Heart size is normal. Upper mediastinal contours are within normal limits. IMPRESSION: 1. Support apparatus, as above. 2. The appearance the chest is compatible with multilobar pneumonia from reported COVID infection. Electronically Signed   By: Mauri Brooklyn.D.  On: 10/15/2019 05:39   DG Chest Port 1 View  Result Date: 10/14/2019 CLINICAL DATA:  COVID-19, ARDS EXAM: PORTABLE CHEST 1 VIEW COMPARISON:  10/13/2019 FINDINGS: Single frontal view of the chest demonstrates stable position of the endotracheal tube and enteric catheter. There is a right-sided PICC tip overlying superior vena cava. The cardiac silhouette is stable. Interstitial and ground-glass opacities are seen throughout the lungs bilaterally, unchanged. Lung volumes are diminished. No effusion or pneumothorax. IMPRESSION: 1. 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 2. Stable support devices. Electronically Signed   By: Randa Ngo M.D.   On: 10/14/2019 15:57   DG Chest Port 1 View  Result Date: 10/13/2019 CLINICAL DATA:  Acute respiratory failure with hypoxemia, COVID positive, asthma, diabetes mellitus, hypertension, CHF EXAM: PORTABLE CHEST 1 VIEW COMPARISON:  Portable exam 1021 hours compared to 10/12/2019 FINDINGS: Tip of endotracheal tube projects 3.7 cm above carina. Nasogastric tube extends into stomach. RIGHT arm PICC line tip projects over cavoatrial junction. Stable heart size mediastinal contours for technique. Patchy  BILATERAL airspace infiltrates consistent with multifocal pneumonia, minimally improved. No pleural effusion or pneumothorax. IMPRESSION: Minimally improved pulmonary infiltrates. Electronically Signed   By: Lavonia Dana M.D.   On: 10/13/2019 10:37   DG CHEST PORT 1 VIEW  Result Date: 10/12/2019 CLINICAL DATA:  Intubation EXAM: PORTABLE CHEST 1 VIEW COMPARISON:  Earlier today FINDINGS: New endotracheal tube with tip at the clavicular heads, measuring 3 cm above the carina. Low volume chest with severe airspace disease. No visible effusion or air leak. Stable heart size which is distorted by positioning and volumes. IMPRESSION: 1. Unremarkable positioning of the endotracheal tube. 2. Stable extensive airspace disease with low lung volumes. Electronically Signed   By: Monte Fantasia M.D.   On: 10/12/2019 07:13   DG CHEST PORT 1 VIEW  Result Date: 10/12/2019 CLINICAL DATA:  Shortness of breath EXAM: PORTABLE CHEST 1 VIEW COMPARISON:  Three days ago FINDINGS: Very low volume chest with diffuse and patchy pulmonary opacity. Vascular pedicle widening and prominent heart size largely related to technique. No visible effusion or pneumothorax. IMPRESSION: Worsening multifocal pneumonia.  Lung volumes remain very low. Electronically Signed   By: Monte Fantasia M.D.   On: 10/12/2019 04:07   DG Chest Portable 1 View  Result Date: 10/09/2019 CLINICAL DATA:  Cough, fever, shortness of breath EXAM: PORTABLE CHEST 1 VIEW COMPARISON:  None. FINDINGS: Single frontal view of the chest demonstrates unremarkable cardiac silhouette. The lung volumes are diminished, with patchy bilateral areas of faint ground-glass airspace disease consistent with multifocal pneumonia. No effusion or pneumothorax. IMPRESSION: 1. Patchy bilateral ground-glass airspace disease consistent with multifocal pneumonia. Electronically Signed   By: Randa Ngo M.D.   On: 10/09/2019 19:32   DG Abd Portable 1V  Result Date: 10/20/2019 CLINICAL  DATA:  GI problem. EXAM: PORTABLE ABDOMEN - 1 VIEW COMPARISON:  10/16/2019 FINDINGS: Feeding tube tip is in the distal stomach or proximal duodenum. Mild gaseous distention of the stomach. Gas throughout nondistended large and small bowel. No evidence of bowel obstruction, organomegaly or free air. IMPRESSION: Feeding tube tip in the distal stomach or proximal duodenum. No evidence of obstruction or free air. Electronically Signed   By: Rolm Baptise M.D.   On: 10/20/2019 21:28   DG Abd Portable 1V  Result Date: 10/16/2019 CLINICAL DATA:  Status post OG tube placement EXAM: PORTABLE ABDOMEN - 1 VIEW COMPARISON:  10/16/2019 FINDINGS: The enteric  tube tip projects over the distal body of stomach in the side port is below the level of the GE junction. A few air-filled loops of small bowel are noted within the upper abdomen which measure up to 2.8 cm. IMPRESSION: Satisfactory position of enteric tube. Electronically Signed   By: Kerby Moors M.D.   On: 10/16/2019 06:52   DG Abd Portable 1V  Result Date: 10/12/2019 CLINICAL DATA:  Onychogryphosis EXAM: PORTABLE ABDOMEN - 1 VIEW COMPARISON:  Portable exam 0830 hours without priors for comparison FINDINGS: Tip of nasogastric tube projects over distal antrum. Nonobstructive bowel gas pattern. No bowel dilatation or bowel wall thickening. Patchy bibasilar infiltrates. No acute osseous findings. IMPRESSION: Tip of nasogastric tube projects over distal gastric antrum. Nonobstructive bowel gas pattern. Bibasilar pulmonary infiltrates. Electronically Signed   By: Lavonia Dana M.D.   On: 10/12/2019 08:51   DG Foot 2 Views Left  Result Date: 10/23/2019 CLINICAL DATA:  Bacteremia, LEFT foot pain and diabetic. EXAM: LEFT FOOT - 2 VIEW COMPARISON:  10/13/2019 and prior radiographs FINDINGS: Mildly angulated fractures of the 4th and 5th metatarsals are again noted with fracture lines still evident. Healing changes along these fractures are noted. Overlying soft tissue  swelling is noted. No new fracture, subluxation or dislocation noted. No definite radiographic evidence of acute osteomyelitis noted. IMPRESSION: Mildly angulated healing fractures of the 4th and 5th metatarsals with soft tissue swelling again noted but without definite radiographic evidence of acute osteomyelitis. Consider MRI for further evaluation as clinically indicated. Electronically Signed   By: Margarette Canada M.D.   On: 10/23/2019 13:40   DG Foot 2 Views Left  Result Date: 10/13/2019 CLINICAL DATA:  50 year old male with diabetic foot. EXAM: LEFT FOOT - 2 VIEW COMPARISON:  Left foot radiograph dated 09/11/2019. FINDINGS: Evaluation is limited due to positioning. Fractures of the midportion of the fourth and fifth metacarpal with interval progression of bridging callus formation. No new fracture identified. There is no dislocation. There is diffuse soft tissue swelling of the foot. No radiopaque foreign object or soft tissue gas. IMPRESSION: 1. Healing fractures of the fourth and fifth metacarpal. 2. Diffuse soft tissue swelling. No radiopaque foreign object or soft tissue gas. Electronically Signed   By: Anner Crete M.D.   On: 10/13/2019 16:49   EEG adult  Result Date: 10/27/2019 Lora Havens, MD     10/28/2019 10:13 AM Patient Name: RAYDER SULLENGER MRN: [id number] Epilepsy Attending: Lora Havens Referring Physician/Provider: Dr. Donnetta Simpers Date: 10/27/2019 Duration: 22.22 minutes Patient history: 49 year old male with altered mental status. EEG evaluate for seizures. Level of alertness: Awake, asleep AEDs during EEG study: Keppra Technical aspects: This EEG study was done with scalp electrodes positioned according to the 10-20 International system of electrode placement. Electrical activity was acquired at a sampling rate of 500Hz and reviewed with a high frequency filter of 70Hz and a low frequency filter of 1Hz. EEG data were recorded continuously and digitally stored. Description: The  posterior dominant rhythm consists of 8 Hz activity of moderate voltage (25-35 uV) seen predominantly in posterior head regions, symmetric and reactive to eye opening and eye closing. Sleep was characterized by vertex waves, sleep spindles (12 to 14 Hz), maximal frontocentral region. Frequent runs of sharp waves were seen in frontocentral region, maximal vertex lasting about 5 to 6 seconds consistent with brief ictal-interictal rhythmic discharges. Continuous generalized 5 to 6 Hz theta slowing was also noted. Hyperventilation and photic stimulation were not performed.   ABNORMALITY -  Continuous slow, generalized -Brief ictal-interictal rhythmic discharges, bilateral frontocentral region, maximal vertex IMPRESSION: This study showed evidence of potential epileptogenicity arising from bilateral frontocentral, maximal vertex region. The sharp waves are at times rhythmic consistent with brief ictal-interictal rhythmic discharges. Additionally, there is evidence of mild diffuse encephalopathy, nonspecific etiology. Priyanka Barbra Sarks   Overnight EEG with video  Result Date: 10/28/2019 Lora Havens, MD     10/29/2019  8:27 AM Patient Name: PIKE SCANTLEBURY MRN: [id number] Epilepsy Attending: Lora Havens Referring Physician/Provider: Dr. Donnetta Simpers Duration: 10/27/2019 1145 to 10/28/2019 1145  Patient history: 49 year old male with altered mental status. EEG evaluate for seizures.  Level of alertness: Awake, asleep AEDs during EEG study: Keppra  Technical aspects: This EEG study was done with scalp electrodes positioned according to the 10-20 International system of electrode placement. Electrical activity was acquired at a sampling rate of 500Hz and reviewed with a high frequency filter of 70Hz and a low frequency filter of 1Hz. EEG data were recorded continuously and digitally stored.  Description: The posterior dominant rhythm consists of 8 Hz activity of moderate voltage (25-35 uV) seen predominantly in  posterior head regions, symmetric and reactive to eye opening and eye closing. Sleep was characterized by vertex waves, sleep spindles (12 to 14 Hz), maximal frontocentral region. Initially, frequent runs of sharp waves were seen in frontocentral region, maximal vertex lasting about 5 to 6 seconds consistent with brief ictal-interictal rhythmic discharges. Continuous generalized 5 to 6 Hz theta slowing as well as intermittent generalized rhythmic 2-3hz delta slowing  was also noted. Hyperventilation and photic stimulation were not performed.    ABNORMALITY -Continuous slow, generalized - Intermittent rhythmic delta slowing, generalized -Brief ictal-interictal rhythmic discharges, bilateral frontocentral region, maximal vertex  IMPRESSION: This study initially showed evidence of potential epileptogenicity arising from bilateral frontocentral, maximal vertex region. The sharp waves are at times rhythmic consistent with brief ictal-interictal rhythmic discharges. Additionally, there is evidence of mild diffuse encephalopathy, nonspecific etiology. No seizures were seen during thi study. EEG appears to be improving compared to previous day.  Lora Havens   ECHOCARDIOGRAM COMPLETE  Result Date: 10/12/2019    ECHOCARDIOGRAM REPORT   Patient Name:   KAMERON BLETHEN Date of Exam: 10/12/2019 Medical Rec #:  [id number]      Height:       74.0 in Accession #:    [id number]     Weight:       245.0 lb Date of Birth:  1970-11-19      BSA:          2.369 m Patient Age:    12 years       BP:           111/74 mmHg Patient Gender: M              HR:           75 bpm. Exam Location:  Inpatient Procedure: 2D Echo Indications:    CHF 428                  & Bacteremia 790.7  History:        Patient has no prior history of Echocardiogram examinations.                 CHF; Risk Factors:Hypertension and Diabetes. Covid +.  Sonographer:    Jannett Celestine RDCS (AE) Referring Phys: [id number] Silvestre Moment [facility]  Sonographer Comments: Echo  performed with patient supine and on artificial  respirator. Image acquisition challenging due to respiratory motion and Image acquisition challenging due to patient body habitus. restricted mobility IMPRESSIONS  1. Extremely poor acoustic windows. LVEF appears to be depressed with diffuse hypokinesis, worse in the mid/distal inferior/inferoseptal walls. . Left ventricular ejection fraction, by estimation, is 30 to 35%. The left ventricle has moderately decreased function. The left ventricular internal cavity size was mildly dilated. Left ventricular diastolic parameters are indeterminate.  2. Right ventricular systolic function is moderately reduced. The right ventricular size is mildly enlarged.  3. The mitral valve is normal in structure. Trivial mitral valve regurgitation.  4. The aortic valve is normal in structure. Aortic valve regurgitation is not visualized. FINDINGS  Left Ventricle: Extremely poor acoustic windows. LVEF appears to be depressed with diffuse hypokinesis, worse in the mid/distal inferior/inferoseptal walls. Left ventricular ejection fraction, by estimation, is 30 to 35%. The left ventricle has moderately decreased function. The left ventricle demonstrates regional wall motion abnormalities. The left ventricular internal cavity size was mildly dilated. There is no left ventricular hypertrophy. Left ventricular diastolic parameters are indeterminate. Right Ventricle: The right ventricular size is mildly enlarged. Right vetricular wall thickness was not assessed. Right ventricular systolic function is moderately reduced. Left Atrium: Left atrial size was normal in size. Right Atrium: Right atrial size was normal in size. Pericardium: There is no evidence of pericardial effusion. Mitral Valve: The mitral valve is normal in structure. Trivial mitral valve regurgitation. Tricuspid Valve: The tricuspid valve is normal in structure. Tricuspid valve regurgitation is trivial. Aortic Valve: The aortic  valve is normal in structure. Aortic valve regurgitation is not visualized. Pulmonic Valve: The pulmonic valve was grossly normal. Pulmonic valve regurgitation is trivial. Aorta: The aortic root is normal in size and structure. IAS/Shunts: The interatrial septum was not assessed.  LEFT VENTRICLE PLAX 2D LVIDd:         5.60 cm  Diastology LVIDs:         3.90 cm  LV e' lateral:   8.81 cm/s LV PW:         1.40 cm  LV E/e' lateral: 5.7 LV IVS:        1.10 cm  LV e' medial:    5.87 cm/s LVOT diam:     2.40 cm  LV E/e' medial:  8.5 LV SV:         64 LV SV Index:   27 LVOT Area:     4.52 cm  RIGHT VENTRICLE RV S prime:     9.79 cm/s TAPSE (M-mode): 1.3 cm LEFT ATRIUM             Index       RIGHT ATRIUM           Index LA diam:        3.10 cm 1.31 cm/m  RA Area:     18.80 cm LA Vol (A2C):   62.9 ml 26.55 ml/m RA Volume:   51.80 ml  21.86 ml/m LA Vol (A4C):   40.8 ml 17.22 ml/m LA Biplane Vol: 50.8 ml 21.44 ml/m  AORTIC VALVE LVOT Vmax:   69.80 cm/s LVOT Vmean:  49.400 cm/s LVOT VTI:    0.142 m  AORTA Ao Root diam: 3.60 cm MITRAL VALVE MV Area (PHT): 2.34 cm    SHUNTS MV Decel Time: 324 msec    Systemic VTI:  0.14 m MV E velocity: 50.10 cm/s  Systemic Diam: 2.40 cm MV A velocity: 71.60 cm/s MV E/A ratio:  0.70 Dorris Carnes  MD Electronically signed by Dorris Carnes MD Signature Date/Time: 10/12/2019/1:58:41 PM    Final    ECHO TEE  Result Date: 10/24/2019    TRANSESOPHOGEAL ECHO REPORT   Patient Name:   KOHLE WINNER Date of Exam: 10/24/2019 Medical Rec #:  [id number]      Height:       74.0 in Accession #:    [id number]     Weight:       247.4 lb Date of Birth:  [birth year]      BSA:          2.379 m Patient Age:    8 years       BP:           120/80 mmHg Patient Gender: M              HR:           100 bpm. Exam Location:  Inpatient Procedure: Transesophageal Echo Indications:    Emobli/Bacteremia  History:        Patient has prior history of Echocardiogram examinations.  Sonographer:    Clayton Lefort RDCS (AE) Referring  Phys: [phone] HAO MENG PROCEDURE: The transesophogeal probe was passed without difficulty through the esophogus of the patient. Sedation performed by different physician. The patient's vital signs; including heart rate, blood pressure, and oxygen saturation; remained stable throughout the procedure. The patient developed no complications during the procedure. IMPRESSIONS  1. Left ventricular ejection fraction, by estimation, is 50 to 55%. The left ventricle has low normal function. The left ventricle has no regional wall motion abnormalities.  2. Right ventricular systolic function is normal. The right ventricular size is normal.  3. No left atrial/left atrial appendage thrombus was detected.  4. The mitral valve is normal in structure. Trivial mitral valve regurgitation. No evidence of mitral stenosis.  5. The aortic valve is normal in structure. Aortic valve regurgitation is not visualized. No aortic stenosis is present.  6. The inferior vena cava is normal in size with greater than 50% respiratory variability, suggesting right atrial pressure of 3 mmHg. Conclusion(s)/Recommendation(s): Normal biventricular function without evidence of hemodynamically significant valvular heart disease. FINDINGS  Left Ventricle: Left ventricular ejection fraction, by estimation, is 50 to 55%. The left ventricle has low normal function. The left ventricle has no regional wall motion abnormalities. The left ventricular internal cavity size was normal in size. There is no left ventricular hypertrophy. Right Ventricle: The right ventricular size is normal. No increase in right ventricular wall thickness. Right ventricular systolic function is normal. Left Atrium: Left atrial size was normal in size. No left atrial/left atrial appendage thrombus was detected. Right Atrium: Right atrial size was normal in size. Pericardium: There is no evidence of pericardial effusion. Mitral Valve: The mitral valve is normal in structure. Normal  mobility of the mitral valve leaflets. Trivial mitral valve regurgitation. No evidence of mitral valve stenosis. There is no evidence of mitral valve vegetation. Tricuspid Valve: The tricuspid valve is normal in structure. Tricuspid valve regurgitation is mild . No evidence of tricuspid stenosis. There is no evidence of tricuspid valve vegetation. Aortic Valve: The aortic valve is normal in structure. Aortic valve regurgitation is not visualized. No aortic stenosis is present. There is no evidence of aortic valve vegetation. Pulmonic Valve: The pulmonic valve was normal in structure. Pulmonic valve regurgitation is trivial. No evidence of pulmonic stenosis. Aorta: The aortic root is normal in size and structure. Venous: The inferior vena cava is normal in  size with greater than 50% respiratory variability, suggesting right atrial pressure of 3 mmHg. IAS/Shunts: No atrial level shunt detected by color flow Doppler. Jenkins Rouge MD Electronically signed by Jenkins Rouge MD Signature Date/Time: 10/24/2019/3:28:45 PM    Final    VAS US CAROTID (at [facility] and [facility] only)  Result Date: 10/26/2019 Carotid Arterial Duplex Study Indications:       75m focus of diffusion restriction in the left                    frontoparietal lobe concerning for acute/early subacute                    infarct vs cerebritis. Risk Factors:      Hypertension, Diabetes. Other Factors:     Covid-19, bacteremia. Comparison Study:  No prior study on file Performing Technologist: CSharion DoveRVS  Examination Guidelines: A complete evaluation includes B-mode imaging, spectral Doppler, color Doppler, and power Doppler as needed of all accessible portions of each vessel. Bilateral testing is considered an integral part of a complete examination. Limited examinations for reoccurring indications may be performed as noted.  Right Carotid Findings: +----------+--------+--------+--------+------------------+--------+           PSV cm/sEDV  cm/sStenosisPlaque DescriptionComments +----------+--------+--------+--------+------------------+--------+ CCA Prox  66      11                                         +----------+--------+--------+--------+------------------+--------+ CCA Distal71      18                                         +----------+--------+--------+--------+------------------+--------+ ICA Prox  58      19                                         +----------+--------+--------+--------+------------------+--------+ ICA Distal90      33                                         +----------+--------+--------+--------+------------------+--------+ ECA       114     16                                         +----------+--------+--------+--------+------------------+--------+ +----------+--------+-------+--------+-------------------+           PSV cm/sEDV cmsDescribeArm Pressure (mmHG) +----------+--------+-------+--------+-------------------+ [id number]                                       +----------+--------+-------+--------+-------------------+ +---------+--------+--+--------+--+ VertebralPSV cm/s38EDV cm/s11 +---------+--------+--+--------+--+  Left Carotid Findings: +----------+--------+--------+--------+------------------+--------+           PSV cm/sEDV cm/sStenosisPlaque DescriptionComments +----------+--------+--------+--------+------------------+--------+ CCA Prox  55      14                                         +----------+--------+--------+--------+------------------+--------+  CCA Distal65      20                                         +----------+--------+--------+--------+------------------+--------+ ICA Prox  68      20                                         +----------+--------+--------+--------+------------------+--------+ ICA Distal76      23                                          +----------+--------+--------+--------+------------------+--------+ ECA       77      3                                          +----------+--------+--------+--------+------------------+--------+ +----------+--------+--------+--------+-------------------+           PSV cm/sEDV cm/sDescribeArm Pressure (mmHG) +----------+--------+--------+--------+-------------------+ [id number]                                          +----------+--------+--------+--------+-------------------+ +---------+--------+--+--------+--+ VertebralPSV cm/s63EDV cm/s18 +---------+--------+--+--------+--+   Summary: Right Carotid: The extracranial vessels were near-normal with only minimal wall                thickening or plaque. Left Carotid: The extracranial vessels were near-normal with only minimal wall               thickening or plaque. Vertebrals:  Bilateral vertebral arteries demonstrate antegrade flow. Subclavians: Normal flow hemodynamics were seen in bilateral subclavian              arteries. *See table(s) above for measurements and observations.  Electronically signed by Antony Contras MD on 10/26/2019 at 12:37:48 PM.    Final    VAS [place] LOWER EXTREMITY ARTERIAL DUPLEX  Result Date: 10/26/2019 LOWER EXTREMITY ARTERIAL DUPLEX STUDY Indications: Ulceration, and Osteomyelitis. High Risk Factors: Hypertension, Diabetes. Other Factors: Covid-19. Charcot foot.  Current ABI: N/A Comparison Study: No prior study Performing Technologist: Sharion Dove RVS  Examination Guidelines: A complete evaluation includes B-mode imaging, spectral Doppler, color Doppler, and power Doppler as needed of all accessible portions of each vessel. Bilateral testing is considered an integral part of a complete examination. Limited examinations for reoccurring indications may be performed as noted.  +----------+--------+-----+--------+---------+--------+ LEFT      PSV cm/sRatioStenosisWaveform Comments  +----------+--------+-----+--------+---------+--------+ CFA Prox  123                  triphasic         +----------+--------+-----+--------+---------+--------+ DFA       51                   triphasic         +----------+--------+-----+--------+---------+--------+ SFA Prox  97                   triphasic         +----------+--------+-----+--------+---------+--------+ SFA Mid   88  triphasic         +----------+--------+-----+--------+---------+--------+ SFA Distal81                   triphasic         +----------+--------+-----+--------+---------+--------+ POP Prox  60                   triphasic         +----------+--------+-----+--------+---------+--------+ POP Distal86                   triphasic         +----------+--------+-----+--------+---------+--------+ ATA Distal110                  triphasic         +----------+--------+-----+--------+---------+--------+ PTA Prox  34                   triphasic         +----------+--------+-----+--------+---------+--------+ PTA Mid   29                   triphasic         +----------+--------+-----+--------+---------+--------+ PTA [id number]                  triphasic         +----------+--------+-----+--------+---------+--------+  Summary: Left: Normal waveforms and adequate flow noted.  See table(s) above for measurements and observations. Electronically signed by Servando Snare MD on 10/26/2019 at 4:50:14 PM.    Final    VAS [place] LOWER EXTREMITY ARTERIAL DUPLEX  Result Date: 10/19/2019 LOWER EXTREMITY ARTERIAL DUPLEX STUDY Indications: Cold leg, Covid-19.  Current ABI: N/A Comparison Study: No prior study Performing Technologist: Sharion Dove RVS  Examination Guidelines: A complete evaluation includes B-mode imaging, spectral Doppler, color Doppler, and power Doppler as needed of all accessible portions of each vessel. Bilateral testing is considered an integral part of a  complete examination. Limited examinations for reoccurring indications may be performed as noted.  +-----------+--------+-----+--------+---------+--------+ RIGHT      PSV cm/sRatioStenosisWaveform Comments +-----------+--------+-----+--------+---------+--------+ CFA Prox   67                   triphasic         +-----------+--------+-----+--------+---------+--------+ DFA        60                   triphasic         +-----------+--------+-----+--------+---------+--------+ SFA Prox   68                   triphasic         +-----------+--------+-----+--------+---------+--------+ SFA Mid    62                   triphasic         +-----------+--------+-----+--------+---------+--------+ SFA Distal 69                   triphasic         +-----------+--------+-----+--------+---------+--------+ POP Prox   47                   triphasic         +-----------+--------+-----+--------+---------+--------+ POP Distal 42                   triphasic         +-----------+--------+-----+--------+---------+--------+ ATA Distal 15  triphasic         +-----------+--------+-----+--------+---------+--------+ PTA Prox   31                   triphasic         +-----------+--------+-----+--------+---------+--------+ PTA Mid    38                   triphasic         +-----------+--------+-----+--------+---------+--------+ PTA Distal 38                   triphasic         +-----------+--------+-----+--------+---------+--------+ PERO Distal36                   triphasic         +-----------+--------+-----+--------+---------+--------+  Summary: Right: Normal examination. No evidence of arterial occlusive disease. Normal waveforms, no stenosis.  See table(s) above for measurements and observations. Electronically signed by Monica Martinez MD on 10/19/2019 at 3:25:02 PM.    Final    VAS [place] LOWER EXTREMITY VENOUS (DVT)  Result Date:  10/26/2019  Lower Venous DVTStudy Indications: Covid-19, elevated D-Dimer.  Comparison Study: Prior negative left lower extremity venous duplex don 03/19/17                   and is available for comparison. Performing Technologist: Sharion Dove RVS  Examination Guidelines: A complete evaluation includes B-mode imaging, spectral Doppler, color Doppler, and power Doppler as needed of all accessible portions of each vessel. Bilateral testing is considered an integral part of a complete examination. Limited examinations for reoccurring indications may be performed as noted. The reflux portion of the exam is performed with the patient in reverse Trendelenburg.  +---------+---------------+---------+-----------+----------+--------------+ RIGHT    CompressibilityPhasicitySpontaneityPropertiesThrombus Aging +---------+---------------+---------+-----------+----------+--------------+ CFV      Full           Yes      Yes                                 +---------+---------------+---------+-----------+----------+--------------+ SFJ      Full                                                        +---------+---------------+---------+-----------+----------+--------------+ FV Prox  Full                                                        +---------+---------------+---------+-----------+----------+--------------+ FV Mid   Full                                                        +---------+---------------+---------+-----------+----------+--------------+ FV DistalFull                                                        +---------+---------------+---------+-----------+----------+--------------+  PFV      Full                                                        +---------+---------------+---------+-----------+----------+--------------+ POP      Partial        Yes      Yes                  Acute           +---------+---------------+---------+-----------+----------+--------------+ PTV      Full                                                        +---------+---------------+---------+-----------+----------+--------------+ PERO     Full                                                        +---------+---------------+---------+-----------+----------+--------------+   +---------+---------------+---------+-----------+----------+--------------+ LEFT     CompressibilityPhasicitySpontaneityPropertiesThrombus Aging +---------+---------------+---------+-----------+----------+--------------+ CFV      Full           Yes      Yes                                 +---------+---------------+---------+-----------+----------+--------------+ SFJ      Full                                                        +---------+---------------+---------+-----------+----------+--------------+ FV Prox  Full                                                        +---------+---------------+---------+-----------+----------+--------------+ FV Mid   Full                                                        +---------+---------------+---------+-----------+----------+--------------+ FV DistalFull                                                        +---------+---------------+---------+-----------+----------+--------------+ PFV      Full                                                        +---------+---------------+---------+-----------+----------+--------------+  POP      Full           Yes      Yes                                 +---------+---------------+---------+-----------+----------+--------------+ PTV      Full                                                        +---------+---------------+---------+-----------+----------+--------------+ PERO     Full                                                         +---------+---------------+---------+-----------+----------+--------------+     Summary: RIGHT: - Findings consistent with acute deep vein thrombosis involving the right popliteal vein. - Ultrasound characteristics of enlarged lymph nodes are noted in the groin.  LEFT: - There is no evidence of deep vein thrombosis in the lower extremity.  *See table(s) above for measurements and observations. Electronically signed by Servando Snare MD on 10/26/2019 at 4:50:27 PM.    Final    [place] EKG SITE RITE  Result Date: 10/12/2019 If Site Rite image not attached, placement could not be confirmed due to current cardiac rhythm.

## 2019-10-29 NOTE — Progress Notes (Signed)
LTM EEG disconnected - no skin breakdown at unhook.  

## 2019-10-30 LAB — GLUCOSE, CAPILLARY
Glucose-Capillary: 128 mg/dL — ABNORMAL HIGH (ref 70–99)
Glucose-Capillary: 127 mg/dL — ABNORMAL HIGH (ref 70–99)
Glucose-Capillary: 211 mg/dL — ABNORMAL HIGH (ref 70–99)
Glucose-Capillary: 170 mg/dL — ABNORMAL HIGH (ref 70–99)
Glucose-Capillary: 209 mg/dL — ABNORMAL HIGH (ref 70–99)

## 2019-10-30 LAB — PROCALCITONIN: Procalcitonin: <0.1 ng/mL

## 2019-10-30 MED ORDER — INSULIN GLARGINE 100 UNIT/ML ~~LOC~~ SOLN
20.0000 [IU] | Freq: Every day | SUBCUTANEOUS | Status: DC
  Administered 2019-10-30 – 2019-11-15 (×17): 20 [IU] via SUBCUTANEOUS
  Filled 2019-10-30 (×17): qty 0.2

## 2019-10-30 MED ORDER — LEVETIRACETAM 500 MG PO TABS
500.0000 mg | ORAL_TABLET | Freq: Two times a day (BID) | ORAL | Status: DC
  Administered 2019-10-30 – 2019-11-15 (×33): 500 mg via ORAL
  Filled 2019-10-30 (×33): qty 1

## 2019-10-30 MED ORDER — IPRATROPIUM-ALBUTEROL 20-100 MCG/ACT IN AERS
1.0000 | INHALATION_SPRAY | Freq: Four times a day (QID) | RESPIRATORY_TRACT | Status: DC
  Administered 2019-10-31 – 2019-11-02 (×9): 1 via RESPIRATORY_TRACT
  Filled 2019-10-30: qty 4

## 2019-10-30 NOTE — Progress Notes (Signed)
Inpatient Rehab Admissions Coordinator:   I spoke with Pt.'s sister and mother over the phone regarding CIR admission. They are interested in CIR for Pt. But want to think about how they would have to rearrange schedules to be able to provide 24/7 assistance. If unable, they understand that Pt. May need SNF. I have no insurance on file and when I called Medicaid for verification, they only had a family planning policy on file for Pt.; however, pt.'s sister stated that another family member had recently called on behalf of the patient and was told he had been approved for full medicaid and that a letter came in the mail. She is checking on this and will call back with information.   Megan Salon, MS, CCC-SLP Rehab Admissions Coordinator  [phone] (celll) [phone] (office)

## 2019-10-30 NOTE — Plan of Care (Signed)
  Problem: Education: Goal: Knowledge of risk factors and measures for prevention of condition will improve Outcome: Progressing   Problem: Coping: Goal: Psychosocial and spiritual needs will be supported Outcome: Progressing   Problem: Respiratory: Goal: Will maintain a patent airway Outcome: Progressing   Problem: Clinical Measurements: Goal: Respiratory complications will improve Outcome: Progressing

## 2019-10-30 NOTE — Progress Notes (Signed)
ANTICOAGULATION CONSULT NOTE  Pharmacy Consult for Lovenox Indication: DVT, RLE  Allergies  Allergen Reactions  . Azithromycin   . Lodine [Etodolac] Anaphylaxis    Patient Measurements: Height: 6\' 2"  (188 cm) Weight: 112.9 kg (248 lb 14.4 oz) IBW/kg (Calculated) : 82.2 Lovenox Dosing Weight: 117.1 kg  Vital Signs: Temp: 97.9 F (36.6 C) (09/06 0719) Temp Source: Oral (09/06 0719) BP: 117/61 (09/06 0719) Pulse Rate: 87 (09/06 0719)  Labs: Recent Labs    10/28/19 0453 10/29/19 0409  HGB 10.6* 9.7*  HCT 32.2* 30.8*  PLT 372 363  CREATININE 0.75 0.67    Estimated Creatinine Clearance: 146 mL/min (by C-G formula based on SCr of 0.67 mg/dL).  Assessment: 49 yr old male previously on Lovenox 60 mg (0.5 mg/kg) SQ q24h for VTE prophylaxis. Duplex negative for DVT on 8/26 but repeated due to rising d-dimer and now positive for RLE DVT. R leg arterial 9/26 did not show arterial occlusive disease. Head CT 9/5 neg for stroke.   Patient has continued to lose weight on admit: 117.1 kg for initial therapeutic lovenox dosing now 112.9 kg. BMI 32. No bleeding reported, CBC not obtained today but 9/5 Hgb down to 9.7, Plts 363.   Goal of Therapy:  Anti-Xa level 0.6-1 units/ml 4hrs after LMWH dose given Monitor platelets by anticoagulation protocol: Yes   Plan:   Continue Lovenox 120 mg SQ q12hr.  Monitor for further weight loss, may need to adjust dose if it falls further  Follow CBC; monitor for s/sx bleeding.  Follow up for transition to oral AC when able.  11/5, PharmD PGY1 Acute Care Pharmacy Resident  10/30/2019 9:33 AM  Please check [email or web address] for unit-specific pharmacy phone numbers.

## 2019-10-30 NOTE — Progress Notes (Signed)
Inpatient Rehab Admissions Coordinator:   I have attempted to call Pt. And wife regarding potential CIR admission. I left a voicemail for Pt.'s wife with request for callback. Will continue to follow for potential admission once contact with pt. And family is established. I will have one of my colleagues see patient later tomorrow or later this week if patient is off of COVID precautions.   Megan Salon, MS, CCC-SLP Rehab Admissions Coordinator  [phone] (celll) [phone] (office)

## 2019-10-30 NOTE — Progress Notes (Signed)
PROGRESS NOTE                                                                                                                                                                                                             Patient Demographics:    Micheal Bell, is a 49 y.o. male, DOB - 1970-12-24, FUX:[id number]  Outpatient Primary MD for the patient is [name], MD    LOS - 18  Admit date - 10/09/2019    Chief Complaint  Patient presents with  . Cough       Brief Narrative - 49 yo male with hx of HTN, T2DM, gout, IBS, fibromyalgia, obesity who presented on 8/16 for COVID pneumonia.  He decompensated on 8/19 and was intubated.  Self extubated on 8/23 and was reintubated.  He's subsequently been extubated on 8/28.  He's been on typical therapies for covid including steroids, baricitinib and remdesivir.  His hospitalization was complicated by MSSA and GBS bacteria.  Also found to have systolic heart failure.  Transferred to [facility] on 10/25/19   Subjective:   Patient in bed, appears comfortable, denies any headache, no fever, no chest pain or pressure, no shortness of breath , no abdominal pain. No focal weakness.   Assessment  & Plan :     1. Acute Hypoxic Resp. Failure due to Acute Covid 19 Viral Pneumonitis during the ongoing 2020 Covid 19 Pandemic - he had severe hypoxia and parenchymal lung injury he was intubated and admitted by ICU, he was started on IV steroids, remdesivir and Baricitinib on 10/12/2019.  He was subsequently extubated on 10/21/2019.  His stay was complicated by bacteremia after which Baricitinib was discontinued on 10/24/2019.  His pulmonary status is gradually improving, currently he is stable and symptom-free on 1L nasal cannula oxygen.  Encouraged the patient to sit up in chair in the daytime use I-S and flutter valve for pulmonary toiletry and then prone in bed when at night.  Will advance activity  and titrate down oxygen as possible.   Recent Labs  Lab 10/23/19 1206 10/23/19 1659 10/24/19 0130 10/25/19 0352 10/25/19 0353 10/25/19 1600 10/25/19 1600 10/26/19 0402 10/27/19 0500 10/28/19 0453 10/28/19 0454 10/29/19 0409 10/29/19 0410 10/30/19 0320  WBC  --   --    < > 15.5*  --   --   --  14.3* 13.1* 10.7*  --  10.5  --   --   PLT  --   --    < > 506*  --   --   --  396 339 372  --  363  --   --   CRP  --   --    < > 3.7*  --   --   --  5.4* 9.2* 8.8*  --  8.4*  --   --   BNP  --   --   --   --  34.4  --   --  38.0 54.1  --  46.0  --  76.9  --   DDIMER  --   --    < > 5.12*  --   --   --  6.29* 5.77* 5.74*  --  5.18*  --   --   PROCALCITON  --   --    < >  --   --  <0.10   < > <0.10 0.13 <0.10  --  <0.10  --  <0.10  AST  --   --    < > 30  --   --   --  _0 --  21  --   --   ALT  --   --    < > 25  --   --   --  _1 --  17  --   --   ALKPHOS  --   --    < > 105  --   --   --  89 93 92  --  86  --   --   BILITOT  --   --    < > 0.4  --   --   --  1.2 1.0 0.6  --  0.8  --   --   ALBUMIN  --   --    < > 2.5*  --   --   --  2.3* 2.2* 2.1*  --  2.1*  --   --   INR  --   --   --   --   --  1.2  --   --   --   --   --   --   --   --   LATICACIDVEN 2.3* 1.1  --   --   --   --   --   --   --   --   --   --   --   --    < > = values in this interval not displayed.    2.  Sepsis with MSSA and Group B strep bacteremia due to left foot abscess.  ID following, was on IV Ancef but switch to nafcillin on 10/26/2019.  Clean TEE, history of Charcot joints, peripheral neuropathy and recent left foot soft tissue injury, MRI left foot shows an abscess, Ortho was consulted and they recommended medical management with outpatient follow-up with Dr. Sharol Given post discharge, also has osteomyelitis, defer duration of treatment with antibiotics to ID.  Current ID recommendations on nafcillin till 11/22/2019 thereafter Keflex with no stop date, follow closely with Dr. Linus Salmons.  3.  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 Also now evidence of cerebritis on MRI along with ongoing seizures on EEG.  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  For seizures he has been started on Keppra with good effect and his mentation has improved considerably. Continue PT OT and speech.  Neuro following.   4. Possible systolic heart failure as shown by TTE however this could have been transient due to sepsis, on repeat TEE his EF is now normalized to 50 to 55%.  He is currently compensated, blood pressure was too low and after hydration has improved, continue beta-blocker.   5.  Cyanotic right lower extremity toes.  Likely due to hypoperfusion which is transient, arterial ultrasound stable.  6.  History of chronic pain.  On multiple narcotics, Flexeril and benzodiazepines.  All on hold in the hospital.  7. HTN - BP was low on multiple blood pressure medications, was hydrated now able to tolerate low-dose beta-blocker.  8.  Elevated D-dimer, positive acute DVT in the right lower extremity.  Switched to full dose Lovenox.   9.  DM type II.  Currently on Lantus and sliding scale monitor.  Lab Results  Component Value Date   HGBA1C 11.3 (H) 10/26/2019   CBG (last 3)  Recent Labs    10/29/19 2350 10/30/19 0401 10/30/19 0721  GLUCAP 196* 128* 127*      Condition -   Guarded  Family Communication  :  Sister and mother [phone] - 10/25/19, 10/27/19, 10/28/19  Code Status :  Full  Consults  :  PCCM, ID, Cards  Procedures  :    EEG - This study showed evidence of potential epileptogenicity arising from bilateral frontocentral, maximal vertex region. The sharp waves are at times rhythmic consistent with brief ictal-interictal rhythmic discharges. Additionally, there is evidence of mild diffuse  encephalopathy, nonspecific etiology.  Bilateral lower extremity venous ultrasound - Acute right lower extremity popliteal vein DVT.  Carotid duplex.  Nonacute.    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  CT Head -  1. No acute intracranial abnormality. 2. Area of subcortical low-density involving the posterior left frontal lobe at the convexity, likely encephalomalacia and sequela of remote infarct. There are no prior exams for comparison to establish chronicity. MRI could be considered for further evaluation based on clinical concern. 3. Paranasal sinus fluid levels on opacification of the right greater than left mastoid air cells, possibly related to intubation.  R. Leg Arterial US -  No evidence of arterial occlusive disease. Normal waveforms, no stenosis  TTE -  1. Extremely poor acoustic windows. LVEF appears to be depressed with diffuse hypokinesis, worse in the mid/distal inferior/inferoseptal walls. . Left ventricular ejection fraction, by estimation, is 30 to 35%. The left ventricle has moderately decreased function. The left ventricular internal cavity size was mildly dilated. Left ventricular diastolic parameters are indeterminate.  2. Right ventricular systolic function is moderately reduced. The right ventricular size is mildly enlarged.  3. The mitral valve is normal in structure. Trivial mitral valve regurgitation.  4. The aortic valve is normal in structure. Aortic valve regurgitation is not visualized  TEE - 1. Left ventricular ejection fraction, by estimation, is 50 to 55%. The left ventricle has low normal function. The left ventricle has no regional wall motion abnormalities.  2. Right ventricular systolic function is normal. The right ventricular size is normal.  3. No left atrial/left atrial appendage thrombus was detected.  4. The mitral valve  is normal in structure. Trivial mitral  valve regurgitation. No evidence of mitral stenosis.  5. The aortic valve is normal in structure. Aortic valve regurgitation is not visualized. No aortic stenosis is present.  6. The inferior vena cava is normal in size with greater than 50% respiratory variability, suggesting right atrial pressure of 3 mmHg.  CT - 1. No acute intracranial abnormality. 2. Area of subcortical low-density involving the posterior left frontal lobe at the convexity, likely encephalomalacia and sequela of remote infarct. There are no prior exams for comparison to establish chronicity. MRI could be considered for further evaluation based on clinical concern. 3. Paranasal sinus fluid levels on opacification of the right greater than left mastoid air cells, possibly related to intubation    PUD Prophylaxis : PPI  Disposition Plan  :    Status is: Inpatient  Remains inpatient appropriate because:IV treatments appropriate due to intensity of illness or inability to take PO   Dispo: The patient is from: Home              Anticipated d/c is to: Home              Anticipated d/c date is: 3 days              Patient currently is not medically stable to d/c.   DVT Prophylaxis  :  Lovenox    Lab Results  Component Value Date   PLT 363 10/29/2019    Diet :  Diet Order            Diet heart healthy/carb modified Room service appropriate? No; Fluid consistency: Thin  Diet effective now                  Inpatient Medications  Scheduled Meds: . (feeding supplement) PROSource Plus  30 mL Oral BID BM  . alteplase  2 mg Intracatheter Once  . atorvastatin  40 mg Oral Daily  . buPROPion  150 mg Oral Daily  . chlorhexidine gluconate (MEDLINE KIT)  15 mL Mouth Rinse BID  . Chlorhexidine Gluconate Cloth  6 each Topical Daily  . docusate sodium  100 mg Oral BID  . enoxaparin (LOVENOX) injection  120 mg Subcutaneous Q12H  . feeding supplement (ENSURE ENLIVE)  237 mL Oral TID BM  . insulin aspart  0-15 Units  Subcutaneous Q4H  . insulin glargine  20 Units Subcutaneous Daily  . levETIRAcetam  500 mg Oral BID  . linagliptin  5 mg Oral Daily  . melatonin  3 mg Oral QHS  . metoprolol tartrate  50 mg Oral BID  . pantoprazole  40 mg Oral Daily  . polyethylene glycol  17 g Oral Daily  . senna  2 tablet Oral BID  . sodium chloride flush  10-40 mL Intracatheter Q12H   Continuous Infusions: . sodium chloride 10 mL/hr at 10/30/19 0409  . nafcillin (NAFCIL) continuous infusion 12 g (10/29/19 1752)   PRN Meds:.sodium chloride, bisacodyl, ipratropium-albuterol  Antibiotics  :    Anti-infectives (From admission, onward)   Start     Dose/Rate Route Frequency Ordered Stop   10/29/19 1415  nafcillin 12 g in dextrose 5 % 500 mL IVPB  Status:  Discontinued        12 g 20.8 mL/hr over 24 Hours Intravenous Every 24 hours 10/29/19 0434 10/29/19 0507   10/29/19 1415  nafcillin 12 g in sodium chloride 0.9 % 500 mL continuous infusion        12 g  20.8 mL/hr over 24 Hours Intravenous Every 24 hours 10/29/19 0507     10/26/19 1415  nafcillin 12 g in dextrose 5 % 500 mL IVPB  Status:  Discontinued        12 g 20.8 mL/hr over 24 Hours Intravenous Every 24 hours 10/26/19 1414 10/29/19 0434   10/26/19 1400  nafcillin injection 12 g  Status:  Discontinued        12 g Intravenous Daily 10/26/19 1238 10/26/19 1242   10/26/19 1400  nafcillin 12 g in dextrose 5 % 50 mL IVPB  Status:  Discontinued        12 g 100 mL/hr over 30 Minutes Intravenous Every 24 hours 10/26/19 1242 10/26/19 1339   10/26/19 1400  nafcillin 12 g in dextrose 5 % 500 mL IVPB  Status:  Discontinued        12 g 20.8 mL/hr over 24 Hours Intravenous Every 24 hours 10/26/19 1337 10/26/19 1416   10/26/19 1200  nafcillin 2 g in sodium chloride 0.9 % 100 mL IVPB  Status:  Discontinued        2 g 200 mL/hr over 30 Minutes Intravenous Every 4 hours 10/26/19 1010 10/26/19 1238   10/12/19 2200  cefTRIAXone (ROCEPHIN) 2 g in sodium chloride 0.9 % 100 mL IVPB   Status:  Discontinued        2 g 200 mL/hr over 30 Minutes Intravenous Every 24 hours 10/12/19 0441 10/12/19 0921   10/12/19 0930  ceFAZolin (ANCEF) IVPB 2g/100 mL premix  Status:  Discontinued        2 g 200 mL/hr over 30 Minutes Intravenous Every 8 hours 10/12/19 0921 10/26/19 1010   10/11/19 1000  remdesivir 100 mg in sodium chloride 0.9 % 100 mL IVPB       "Followed by" Linked Group Details   100 mg 200 mL/hr over 30 Minutes Intravenous Daily 10/10/19 0050 10/14/19 0956   10/11/19 0800  vancomycin (VANCOCIN) IVPB 1000 mg/200 mL premix  Status:  Discontinued        1,000 mg 200 mL/hr over 60 Minutes Intravenous Every 8 hours 10/10/19 2239 10/12/19 0921   10/10/19 2230  vancomycin (VANCOCIN) IVPB 1000 mg/200 mL premix        1,000 mg 200 mL/hr over 60 Minutes Intravenous Every 1 hr x 2 10/10/19 2208 10/11/19 0052   10/10/19 2215  cefTRIAXone (ROCEPHIN) 2 g in sodium chloride 0.9 % 100 mL IVPB  Status:  Discontinued        2 g 200 mL/hr over 30 Minutes Intravenous Every 24 hours 10/10/19 2205 10/12/19 0441   10/10/19 0100  remdesivir 100 mg in sodium chloride 0.9 % 100 mL IVPB       "Followed by" Linked Group Details   100 mg 200 mL/hr over 30 Minutes Intravenous Every 30 min 10/10/19 0050 10/10/19 0332       Time Spent in minutes  30   Lala Lund M.D on 10/30/2019 at 9:39 AM  To page go to [email or web address] - password [facility]  [facility] -  Office  [phone]   See all Orders from today for further details    Objective:   Vitals:   10/29/19 2214 10/29/19 2352 10/30/19 0402 10/30/19 0719  BP: 133/76 125/69 100/68 117/61  Pulse: 88 96 83 87  Resp:  _0 Temp:  98.2 F (36.8 C) 97.8 F (36.6 C) 97.9 F (36.6 C)  TempSrc:  Oral Oral Oral  SpO2:  94% 90%  92%  Weight:   112.9 kg   Height:        Wt Readings from Last 3 Encounters:  10/30/19 112.9 kg  09/11/19 111.1 kg  05/12/17 (!) 145.2 kg     Intake/Output Summary (Last 24 hours) at 10/30/2019  0939 Last data filed at 10/30/2019 0710 Gross per 24 hour  Intake 1199.3 ml  Output 950 ml  Net 249.3 ml     Physical Exam  Awake & alert, chronic right-sided weakness, deltoid weakness in both extremities,  [place].AT,PERRAL Supple Neck,No JVD, No cervical lymphadenopathy appriciated.  Symmetrical Chest wall movement, Good air movement bilaterally, CTAB RRR,No Gallops, Rubs or new Murmurs, No Parasternal Heave +ve B.Sounds, Abd Soft, No tenderness, No organomegaly appriciated, No rebound - guarding or rigidity. Mild cyanosis of the right toes, both ankles under bandage,    Data Review:    CBC Recent Labs  Lab 10/25/19 0352 10/26/19 0402 10/27/19 0500 10/28/19 0453 10/29/19 0409  WBC 15.5* 14.3* 13.1* 10.7* 10.5  HGB 13.2 11.2* 10.4* 10.6* 9.7*  HCT 40.1 34.3* 31.5* 32.2* 30.8*  PLT 506* 396 339 372 363  MCV 94.1 93.7 92.4 93.6 93.1  MCH 31.0 30.6 30.5 30.8 29.3  MCHC 32.9 32.7 33.0 32.9 31.5  RDW 12.4 12.3 12.3 12.5 12.4  LYMPHSABS 2.4 2.4 2.2 1.9 2.8  MONOABS 0.8 0.8 0.7 0.5 0.6  EOSABS 0.1 0.1 0.1 0.1 0.2  BASOSABS 0.1 0.1 0.0 0.1 0.0    Recent Labs  Lab 10/23/19 1206 10/23/19 1659 10/24/19 0130 10/25/19 0352 10/25/19 0353 10/25/19 1600 10/25/19 1600 10/26/19 0402 10/26/19 0439 10/27/19 0500 10/28/19 0453 10/28/19 0454 10/29/19 0409 10/29/19 0410 10/30/19 0320  NA  --   --    < > 141  --   --   --  134*  --  135 132*  --  134*  --   --   K  --   --    < > 3.3*  --   --   --  3.5  --  3.5 3.5  --  3.7  --   --   CL  --   --    < > 102  --   --   --  101  --  102 99  --  100  --   --   CO2  --   --    < > 27  --   --   --  24  --  25 25  --  26  --   --   GLUCOSE  --   --    < > 100*  --   --   --  111*  --  134* 205*  --  145*  --   --   BUN  --   --    < > 17  --   --   --  12  --  11 8  --  7  --   --   CREATININE  --   --    < > 0.69  --   --   --  0.75  --  0.73 0.75  --  0.67  --   --   CALCIUM  --   --    < > 9.1  --   --   --  8.6*  --  8.5* 8.3*   --  8.5*  --   --   AST  --   --    < >  30  --   --   --  23  --  28 24  --  21  --   --   ALT  --   --    < > 25  --   --   --  16  --  17 18  --  17  --   --   ALKPHOS  --   --    < > 105  --   --   --  89  --  93 92  --  86  --   --   BILITOT  --   --    < > 0.4  --   --   --  1.2  --  1.0 0.6  --  0.8  --   --   ALBUMIN  --   --    < > 2.5*  --   --   --  2.3*  --  2.2* 2.1*  --  2.1*  --   --   MG  --   --    < > 1.7  --   --   --  1.5*  --  1.7 1.4*  --  1.4*  --   --   CRP  --   --    < > 3.7*  --   --   --  5.4*  --  9.2* 8.8*  --  8.4*  --   --   DDIMER  --   --    < > 5.12*  --   --   --  6.29*  --  5.77* 5.74*  --  5.18*  --   --   PROCALCITON  --   --    < >  --   --  <0.10   < > <0.10  --  0.13 <0.10  --  <0.10  --  <0.10  LATICACIDVEN 2.3* 1.1  --   --   --   --   --   --   --   --   --   --   --   --   --   INR  --   --   --   --   --  1.2  --   --   --   --   --   --   --   --   --   TSH 1.422  --   --   --   --   --   --   --  4.137  --   --   --   --   --   --   HGBA1C  --   --   --   --   --   --   --  11.3*  --   --   --   --   --   --   --   AMMONIA 22  --   --   --   --   --   --   --   --   --   --   --   --   --   --   BNP  --   --   --   --  34.4  --   --  38.0  --  54.1  --  46.0  --  76.9  --    < > = values in this interval not displayed.    ------------------------------------------------------------------------------------------------------------------  No results for input(s): CHOL, HDL, LDLCALC, TRIG, CHOLHDL, LDLDIRECT in the last 72 hours.  Lab Results  Component Value Date   HGBA1C 11.3 (H) 10/26/2019   ------------------------------------------------------------------------------------------------------------------ No results for input(s): TSH, T4TOTAL, T3FREE, THYROIDAB in the last 72 hours.  Invalid input(s): FREET3  Cardiac Enzymes No results for input(s): CKMB, TROPONINI, MYOGLOBIN in the last 168 hours.  Invalid input(s):  CK ------------------------------------------------------------------------------------------------------------------    Component Value Date/Time   BNP 76.9 10/29/2019 0410    Micro Results Recent Results (from the past 240 hour(s))  Culture, blood (routine x 2)     Status: None   Collection Time: 10/23/19 10:58 AM   Specimen: BLOOD  Result Value Ref Range Status   Specimen Description BLOOD LEFT ANTECUBITAL  Final   Special Requests   Final    BOTTLES DRAWN AEROBIC ONLY Blood Culture adequate volume   Culture   Final    NO GROWTH 5 DAYS Performed at [facility], [address] [place], [place] [address]    Report Status 10/28/2019 FINAL  Final  Culture, blood (routine x 2)     Status: None   Collection Time: 10/23/19 11:00 AM   Specimen: BLOOD LEFT HAND  Result Value Ref Range Status   Specimen Description BLOOD LEFT HAND  Final   Special Requests   Final    BOTTLES DRAWN AEROBIC ONLY Blood Culture adequate volume   Culture   Final    NO GROWTH 5 DAYS Performed at [facility], [place] [address], [place],  [address]    Report Status 10/28/2019 FINAL  Final    Radiology Reports CT HEAD WO CONTRAST  Result Date: 10/19/2019 CLINICAL DATA:  Deliriums.  Mental status change. EXAM: CT HEAD WITHOUT CONTRAST TECHNIQUE: Contiguous axial images were obtained from the base of the skull through the vertex without intravenous contrast. COMPARISON:  None. FINDINGS: Brain: Area subcortical low-density involving the posterior left frontal lobe at the convexity, series 3, image 25 and series 5, image 49, likely encephalomalacia and sequela of remote infarct, however nonspecific. No hemorrhage. No evidence of acute ischemia. No hydrocephalus, midline shift or mass effect. No subdural or extra-axial collection. Vascular: Atherosclerosis of skullbase vasculature without hyperdense vessel or abnormal calcification. Skull: No fracture or focal lesion. Sinuses/Orbits: Nasogastric  tube in place. Fluid levels in the sphenoid sinuses and right maxillary sinus with scattered opacification of ethmoid air cells may be related to intubation. Opacification of the right greater than left mastoid air cells. Other: None. IMPRESSION: 1. No acute intracranial abnormality. 2. Area of subcortical low-density involving the posterior left frontal lobe at the convexity, likely encephalomalacia and sequela of remote infarct. There are no prior exams for comparison to establish chronicity. MRI could be considered for further evaluation based on clinical concern. 3. Paranasal sinus fluid levels on opacification of the right greater than left mastoid air cells, possibly related to intubation. Electronically Signed   By: Keith Rake M.D.   On: 10/19/2019 15:30   CT ANGIO CHEST PE W OR WO CONTRAST  Result Date: 10/23/2019 CLINICAL DATA:  COVID pneumonia, acute hypoxic respiratory failure, positive D-dimer EXAM: CT ANGIOGRAPHY CHEST WITH CONTRAST TECHNIQUE: Multidetector CT imaging of the chest was performed using the standard protocol during bolus administration of intravenous contrast. Multiplanar CT image reconstructions and MIPs were obtained to evaluate the vascular anatomy. CONTRAST:  58m OMNIPAQUE IOHEXOL 350 MG/ML SOLN COMPARISON:  None. FINDINGS: Cardiovascular: Satisfactory opacification of the pulmonary arteries to the segmental level.  No evidence of pulmonary embolism. The central pulmonary arteries are of normal caliber. There is moderate coronary artery calcification. Normal heart size. No pericardial effusion. The thoracic aorta is unremarkable save for bovine arch anatomy. Right upper extremity PICC line tip is seen within the superior vena cava. Mediastinum/Nodes: No enlarged mediastinal, hilar, or axillary lymph nodes. Thyroid gland, trachea, and esophagus demonstrate no significant findings. Nasoenteric feeding tube extends into the upper abdomen beyond the margin of the examination.  Lungs/Pleura: Lung volumes are small. Evaluation is limited by motion artifact, however, multifocal pulmonary infiltrates are identified demonstrating a peripheral and basal predominance, likely infectious or inflammatory in the acute setting. No central obstructing lesion. No pneumothorax or pleural effusion. No superimposed interstitial edema. Upper Abdomen: No acute abnormality. Musculoskeletal: No chest wall abnormality. No acute or significant osseous findings. Review of the MIP images confirms the above findings. IMPRESSION: 1. No evidence of pulmonary embolism. 2. Multifocal pulmonary infiltrates are identified demonstrating a peripheral and basal predominance, likely infectious or inflammatory in the acute setting. 3. Moderate coronary artery calcification. Electronically Signed   By: Fidela Salisbury MD   On: 10/23/2019 16:25   MR ANGIO HEAD WO CONTRAST  Result Date: 10/29/2019 CLINICAL DATA:  Neuro deficit with stroke suspected EXAM: MRA HEAD WITHOUT CONTRAST TECHNIQUE: Angiographic images of the Circle of Willis were obtained using MRA technique without intravenous contrast. COMPARISON:  Four days ago FINDINGS: History of bacteremia and embolic infarction. No vessel beading, stenosis, or pseudoaneurysm. IMPRESSION: Negative intracranial MRA. Electronically Signed   By: Monte Fantasia M.D.   On: 10/29/2019 12:04   MR BRAIN WO CONTRAST  Result Date: 10/25/2019 CLINICAL DATA:  Neuro deficit, acute, stroke suspected. Additional history provided: COVID positive. Additional history obtained from [place] with MSSA and group B strep bacteremia. EXAM: MRI HEAD WITHOUT CONTRAST TECHNIQUE: Multiplanar, multiecho pulse sequences of the brain and surrounding structures were obtained without intravenous contrast. COMPARISON:  Head CT 10/19/2019. FINDINGS: Brain: The examination is limited by poor signal on several sequences as well as motion degradation. Most notably, there is moderate  motion degradation of the axial T2 weighted sequence, severe motion degradation of the axial SWI sequence and moderate motion degradation of the coronal T2 weighted sequence. Cerebral volume is normal for age. There is a 17 mm focus of restricted diffusion and corresponding T2/FLAIR hyperintensity within the subcortical left frontoparietal lobes (for instance as seen on series 2, image 39) (series 3, image 17). Additional mild scattered T2/FLAIR hyperintensity within the cerebral white matter and pons is nonspecific, but consistent with chronic small vessel ischemic disease. Severe motion degradation of the axial SWI sequence precludes adequate evaluation for intracranial blood products. No extra-axial fluid collection. No midline shift. Vascular: Expected proximal arterial flow voids. Skull and upper cervical spine: No focal marrow lesion is identified within described limitations. Sinuses/Orbits: Visualized orbits show no acute finding. Air-fluid levels within the sphenoid and right maxillary sinuses. Partial opacification of right ethmoid air cells. Bilateral mastoid effusions. These results will be called to the ordering clinician or representative by the Radiologist Assistant, and communication documented in the PACS or [employer]. IMPRESSION: Examination limited by poor signal on several sequences as well as motion degradation, as described. 17 mm focus of restricted diffusion and T2/FLAIR hyperintensity within the subcortical left frontoparietal lobes. This finding is nonspecific, but given the patient's history, the primary differential considerations are acute/early subacute infarct versus cerebritis. Consider contrast-enhanced MR imaging of the brain for further evaluation of this lesion.  Mild chronic small vessel ischemic disease. Paranasal sinus disease with air-fluid levels. Bilateral mastoid effusions. Electronically Signed   By: Kellie Simmering DO   On: 10/25/2019 15:16   MR BRAIN W  CONTRAST  Result Date: 10/25/2019 CLINICAL DATA:  Brain mass follow-up EXAM: MRI HEAD WITH CONTRAST TECHNIQUE: Multiplanar, multiecho pulse sequences of the brain and surrounding structures were obtained with intravenous contrast. CONTRAST:  60m GADAVIST GADOBUTROL 1 MMOL/ML IV SOLN COMPARISON:  Brain MRI without contrast 10/25/2019 FINDINGS: Brain: There is a peripherally enhancing lesion at the base of the left precentral gyrus that measures 0.9 x 0.7 x 1.8 cm. There are no other areas of abnormal contrast enhancement. 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 Electronically Signed   By: KUlyses JarredM.D.   On: 10/25/2019 22:47   MR FOOT LEFT WO CONTRAST  Result Date: 10/25/2019 CLINICAL DATA:  Bacteremia left foot pain and diabetic EXAM: MRI OF THE LEFT FOOT WITHOUT CONTRAST TECHNIQUE: Multiplanar, multisequence MR imaging of the left was performed. No intravenous contrast was administered. COMPARISON:  October 23, 2019 FINDINGS: Bones/Joint/Cartilage Mildly angulated nondisplaced fractures of the midshaft of the fourth and fifth metatarsals are noted. There is periosteal reaction seen along the medial margins of the metatarsal shafts. Increased heterogeneous T2 hyperintense signal with subtle T1 hypointensity seen at the base of the fourth and fifth metatarsals. No other osseous marrow signal abnormality is seen. There is no large knee joint effusions noted. The articular surfaces appear to be maintained. Ligaments The Lisfranc ligament is intact. Muscles and Tendons Increased feathery signal with atrophy is seen throughout the muscles of the forefoot. There is a small amount of fluid seen surrounding the posterior tibialis tendon. The remainder of the flexor and extensor tendons are intact. Soft tissues Lateral plantar surface of the forefoot overlying the fifth metatarsal base there is a focal area of  ulceration measuring approximately 8 mm in transverse dimension. A fluid-filled sinus tract is seen extending to the overlying osseous surface. There is a multilocular fluid collection which extends around the dorsal surface of the fifth metatarsal measuring approximately 5.4 x 0.9 x 3.4 cm. There is extensive dorsal and lateral subcutaneous edema and skin thickening. IMPRESSION: Superficial area of ulceration over the plantar lateral aspect of the fifth metatarsal base with a fluid-filled sinus tract and loculated probable abscess extending over the dorsal surface of the fifth metatarsal measuring 5.4 x 0.9 by is a 3.4 cm. Incomplete mildly angulated fourth and fifth metatarsal shaft fractures with periosteal reaction Findings which could be suggestive of reactive marrow versus early osteomyelitis involving the base of the fourth and fifth metatarsals. Electronically Signed   By: BPrudencio PairM.D.   On: 10/25/2019 19:06   DG Chest Port 1 View  Result Date: 10/26/2019 CLINICAL DATA:  Short of breath.  COVID positive EXAM: PORTABLE CHEST 1 VIEW COMPARISON:  10/25/2019 FINDINGS: Hypoventilation with decreased lung volume. Mild bibasilar airspace disease unchanged. No new infiltrate or effusion. Right arm PICC tip in the SVC unchanged. IMPRESSION: Hypoventilation with bibasilar mild airspace disease unchanged. Electronically Signed   By: CFranchot GalloM.D.   On: 10/26/2019 16:10   DG Chest Port 1 View  Result Date: 10/25/2019 CLINICAL DATA:  Shortness of breath. EXAM: PORTABLE CHEST 1 VIEW COMPARISON:  October 16, 2019. FINDINGS: The heart size and mediastinal contours are within normal limits. Hypoinflation of the lungs is noted with mild bibasilar subsegmental  atelectasis. Endotracheal tube has been removed. Right-sided PICC line is unchanged in position. The visualized skeletal structures are unremarkable. IMPRESSION: Hypoinflation of the lungs with mild bibasilar subsegmental atelectasis. Electronically  Signed   By: Marijo Conception M.D.   On: 10/25/2019 08:37   DG CHEST PORT 1 VIEW  Result Date: 10/16/2019 CLINICAL DATA:  Status post intubation. EXAM: PORTABLE CHEST 1 VIEW COMPARISON:  10/14/2019 FINDINGS: ET tube tip is above the carina. There is a right arm PICC line with tip at the cavoatrial junction. Lung volumes are low. Similar appearance of bilateral pulmonary opacities compatible with multifocal pneumonia. IMPRESSION: 1. Stable support apparatus. 2. Persistent bilateral pulmonary opacities compatible with multifocal pneumonia. Electronically Signed   By: Kerby Moors M.D.   On: 10/16/2019 06:14   DG Chest Port 1 View  Result Date: 10/15/2019 CLINICAL DATA:  49 year old male with history of COVID infection. EXAM: PORTABLE CHEST 1 VIEW COMPARISON:  Chest x-ray 10/14/2019. FINDINGS: An endotracheal tube is in place with tip 3.6 cm above the carina. A nasogastric tube is seen extending into the stomach, however, the tip of the nasogastric tube extends below the lower margin of the image. There is a right upper extremity PICC with tip terminating in the right atrium. Lung volumes are low. Widespread patchy areas of interstitial prominence an ill-defined airspace disease again noted throughout the lungs bilaterally. Overall, aeration is essentially unchanged. No pleural effusions. No evidence of pulmonary edema. No pneumothorax. Heart size is normal. Upper mediastinal contours are within normal limits. IMPRESSION: 1. Support apparatus, as above. 2. The appearance the chest is compatible with multilobar pneumonia from reported COVID infection. Electronically Signed   By: Vinnie Langton M.D.   On: 10/15/2019 05:39   DG Chest Port 1 View  Result Date: 10/14/2019 CLINICAL DATA:  COVID-19, ARDS EXAM: PORTABLE CHEST 1 VIEW COMPARISON:  10/13/2019 FINDINGS: Single frontal view of the chest demonstrates stable position of the endotracheal tube and enteric catheter. There is a right-sided PICC tip  overlying superior vena cava. The cardiac silhouette is stable. Interstitial and ground-glass opacities are seen throughout the lungs bilaterally, unchanged. Lung volumes are diminished. No effusion or pneumothorax. IMPRESSION: 1. 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 2. Stable support devices. Electronically Signed   By: Randa Ngo M.D.   On: 10/14/2019 15:57   DG Chest Port 1 View  Result Date: 10/13/2019 CLINICAL DATA:  Acute respiratory failure with hypoxemia, COVID positive, asthma, diabetes mellitus, hypertension, CHF EXAM: PORTABLE CHEST 1 VIEW COMPARISON:  Portable exam 1021 hours compared to 10/12/2019 FINDINGS: Tip of endotracheal tube projects 3.7 cm above carina. Nasogastric tube extends into stomach. RIGHT arm PICC line tip projects over cavoatrial junction. Stable heart size mediastinal contours for technique. Patchy BILATERAL airspace infiltrates consistent with multifocal pneumonia, minimally improved. No pleural effusion or pneumothorax. IMPRESSION: Minimally improved pulmonary infiltrates. Electronically Signed   By: Lavonia Dana M.D.   On: 10/13/2019 10:37   DG CHEST PORT 1 VIEW  Result Date: 10/12/2019 CLINICAL DATA:  Intubation EXAM: PORTABLE CHEST 1 VIEW COMPARISON:  Earlier today FINDINGS: New endotracheal tube with tip at the clavicular heads, measuring 3 cm above the carina. Low volume chest with severe airspace disease. No visible effusion or air leak. Stable heart size which is distorted by positioning and volumes. IMPRESSION: 1. Unremarkable positioning of the endotracheal tube. 2. Stable extensive airspace disease with low lung volumes. Electronically Signed   By:  Monte Fantasia M.D.   On: 10/12/2019 07:13   DG CHEST PORT 1 VIEW  Result Date: 10/12/2019 CLINICAL DATA:  Shortness of breath EXAM: PORTABLE CHEST 1 VIEW COMPARISON:  Three days ago FINDINGS: Very low volume chest  with diffuse and patchy pulmonary opacity. Vascular pedicle widening and prominent heart size largely related to technique. No visible effusion or pneumothorax. IMPRESSION: Worsening multifocal pneumonia.  Lung volumes remain very low. Electronically Signed   By: Monte Fantasia M.D.   On: 10/12/2019 04:07   DG Chest Portable 1 View  Result Date: 10/09/2019 CLINICAL DATA:  Cough, fever, shortness of breath EXAM: PORTABLE CHEST 1 VIEW COMPARISON:  None. FINDINGS: Single frontal view of the chest demonstrates unremarkable cardiac silhouette. The lung volumes are diminished, with patchy bilateral areas of faint ground-glass airspace disease consistent with multifocal pneumonia. No effusion or pneumothorax. IMPRESSION: 1. Patchy bilateral ground-glass airspace disease consistent with multifocal pneumonia. Electronically Signed   By: Randa Ngo M.D.   On: 10/09/2019 19:32   DG Abd Portable 1V  Result Date: 10/20/2019 CLINICAL DATA:  GI problem. EXAM: PORTABLE ABDOMEN - 1 VIEW COMPARISON:  10/16/2019 FINDINGS: Feeding tube tip is in the distal stomach or proximal duodenum. Mild gaseous distention of the stomach. Gas throughout nondistended large and small bowel. No evidence of bowel obstruction, organomegaly or free air. IMPRESSION: Feeding tube tip in the distal stomach or proximal duodenum. No evidence of obstruction or free air. Electronically Signed   By: Rolm Baptise M.D.   On: 10/20/2019 21:28   DG Abd Portable 1V  Result Date: 10/16/2019 CLINICAL DATA:  Status post OG tube placement EXAM: PORTABLE ABDOMEN - 1 VIEW COMPARISON:  10/16/2019 FINDINGS: The enteric tube tip projects over the distal body of stomach in the side port is below the level of the GE junction. A few air-filled loops of small bowel are noted within the upper abdomen which measure up to 2.8 cm. IMPRESSION: Satisfactory position of enteric tube. Electronically Signed   By: Kerby Moors M.D.   On: 10/16/2019 06:52   DG Abd  Portable 1V  Result Date: 10/12/2019 CLINICAL DATA:  Onychogryphosis EXAM: PORTABLE ABDOMEN - 1 VIEW COMPARISON:  Portable exam 0830 hours without priors for comparison FINDINGS: Tip of nasogastric tube projects over distal antrum. Nonobstructive bowel gas pattern. No bowel dilatation or bowel wall thickening. Patchy bibasilar infiltrates. No acute osseous findings. IMPRESSION: Tip of nasogastric tube projects over distal gastric antrum. Nonobstructive bowel gas pattern. Bibasilar pulmonary infiltrates. Electronically Signed   By: Lavonia Dana M.D.   On: 10/12/2019 08:51   DG Foot 2 Views Left  Result Date: 10/23/2019 CLINICAL DATA:  Bacteremia, LEFT foot pain and diabetic. EXAM: LEFT FOOT - 2 VIEW COMPARISON:  10/13/2019 and prior radiographs FINDINGS: Mildly angulated fractures of the 4th and 5th metatarsals are again noted with fracture lines still evident. Healing changes along these fractures are noted. Overlying soft tissue swelling is noted. No new fracture, subluxation or dislocation noted. No definite radiographic evidence of acute osteomyelitis noted. IMPRESSION: Mildly angulated healing fractures of the 4th and 5th metatarsals with soft tissue swelling again noted but without definite radiographic evidence of acute osteomyelitis. Consider MRI for further evaluation as clinically indicated. Electronically Signed   By: Margarette Canada M.D.   On: 10/23/2019 13:40   DG Foot 2 Views Left  Result Date: 10/13/2019 CLINICAL DATA:  49 year old male with diabetic foot. EXAM: LEFT FOOT - 2 VIEW COMPARISON:  Left foot radiograph dated 09/11/2019. FINDINGS:  Evaluation is limited due to positioning. Fractures of the midportion of the fourth and fifth metacarpal with interval progression of bridging callus formation. No new fracture identified. There is no dislocation. There is diffuse soft tissue swelling of the foot. No radiopaque foreign object or soft tissue gas. IMPRESSION: 1. Healing fractures of the fourth  and fifth metacarpal. 2. Diffuse soft tissue swelling. No radiopaque foreign object or soft tissue gas. Electronically Signed   By: Anner Crete M.D.   On: 10/13/2019 16:49   EEG adult  Result Date: 10/27/2019 Lora Havens, MD     10/28/2019 10:13 AM Patient Name: Micheal Bell MRN: [id number] Epilepsy Attending: Lora Havens Referring Physician/Provider: Dr. Donnetta Simpers Date: 10/27/2019 Duration: 22.22 minutes Patient history: 49 year old male with altered mental status. EEG evaluate for seizures. Level of alertness: Awake, asleep AEDs during EEG study: Keppra Technical aspects: This EEG study was done with scalp electrodes positioned according to the 10-20 International system of electrode placement. Electrical activity was acquired at a sampling rate of _0  and reviewed with a high frequency filter of _1  and a low frequency filter of _2 . EEG data were recorded continuously and digitally stored. Description: The posterior dominant rhythm consists of 8 Hz activity of moderate voltage (25-35 uV) seen predominantly in posterior head regions, symmetric and reactive to eye opening and eye closing. Sleep was characterized by vertex waves, sleep spindles (12 to 14 Hz), maximal frontocentral region. Frequent runs of sharp waves were seen in frontocentral region, maximal vertex lasting about 5 to 6 seconds consistent with brief ictal-interictal rhythmic discharges. Continuous generalized 5 to 6 Hz theta slowing was also noted. Hyperventilation and photic stimulation were not performed.   ABNORMALITY -Continuous slow, generalized -Brief ictal-interictal rhythmic discharges, bilateral frontocentral region, maximal vertex IMPRESSION: This study showed evidence of potential epileptogenicity arising from bilateral frontocentral, maximal vertex region. The sharp waves are at times rhythmic consistent with brief ictal-interictal rhythmic discharges. Additionally, there is evidence of mild diffuse  encephalopathy, nonspecific etiology. Priyanka Barbra Sarks   Overnight EEG with video  Result Date: 10/28/2019 Lora Havens, MD     10/29/2019  8:27 AM Patient Name: Micheal Bell MRN: [id number] Epilepsy Attending: Lora Havens Referring Physician/Provider: Dr. Donnetta Simpers Duration: 10/27/2019 1145 to 10/28/2019 1145  Patient history: 49 year old male with altered mental status. EEG evaluate for seizures.  Level of alertness: Awake, asleep AEDs during EEG study: Keppra  Technical aspects: This EEG study was done with scalp electrodes positioned according to the 10-20 International system of electrode placement. Electrical activity was acquired at a sampling rate of _3  and reviewed with a high frequency filter of _4  and a low frequency filter of _5 . EEG data were recorded continuously and digitally stored.  Description: The posterior dominant rhythm consists of 8 Hz activity of moderate voltage (25-35 uV) seen predominantly in posterior head regions, symmetric and reactive to eye opening and eye closing. Sleep was characterized by vertex waves, sleep spindles (12 to 14 Hz), maximal frontocentral region. Initially, frequent runs of sharp waves were seen in frontocentral region, maximal vertex lasting about 5 to 6 seconds consistent with brief ictal-interictal rhythmic discharges. Continuous generalized 5 to 6 Hz theta slowing as well as intermittent generalized rhythmic 2-_6  delta slowing  was also noted. Hyperventilation and photic stimulation were not performed.    ABNORMALITY -Continuous slow, generalized - Intermittent rhythmic delta slowing, generalized -Brief ictal-interictal rhythmic discharges, bilateral frontocentral region, maximal vertex  IMPRESSION: This study initially showed evidence of  potential epileptogenicity arising from bilateral frontocentral, maximal vertex region. The sharp waves are at times rhythmic consistent with brief ictal-interictal rhythmic discharges. Additionally,  there is evidence of mild diffuse encephalopathy, nonspecific etiology. No seizures were seen during thi study. EEG appears to be improving compared to previous day.  Lora Havens   ECHOCARDIOGRAM COMPLETE  Result Date: 10/12/2019    ECHOCARDIOGRAM REPORT   Patient Name:   Micheal Bell Date of Exam: 10/12/2019 Medical Rec #:  [id number]      Height:       74.0 in Accession #:    [id number]     Weight:       245.0 lb Date of Birth:  1970-03-18      BSA:          2.369 m Patient Age:    57 years       BP:           111/74 mmHg Patient Gender: M              HR:           75 bpm. Exam Location:  Inpatient Procedure: 2D Echo Indications:    CHF 428                  & Bacteremia 790.7  History:        Patient has no prior history of Echocardiogram examinations.                 CHF; Risk Factors:Hypertension and Diabetes. Covid +.  Sonographer:    Jannett Celestine RDCS (AE) Referring Phys: [id number] Silvestre Moment [facility]  Sonographer Comments: Echo performed with patient supine and on artificial respirator. Image acquisition challenging due to respiratory motion and Image acquisition challenging due to patient body habitus. restricted mobility IMPRESSIONS  1. Extremely poor acoustic windows. LVEF appears to be depressed with diffuse hypokinesis, worse in the mid/distal inferior/inferoseptal walls. . Left ventricular ejection fraction, by estimation, is 30 to 35%. The left ventricle has moderately decreased function. The left ventricular internal cavity size was mildly dilated. Left ventricular diastolic parameters are indeterminate.  2. Right ventricular systolic function is moderately reduced. The right ventricular size is mildly enlarged.  3. The mitral valve is normal in structure. Trivial mitral valve regurgitation.  4. The aortic valve is normal in structure. Aortic valve regurgitation is not visualized. FINDINGS  Left Ventricle: Extremely poor acoustic windows. LVEF appears to be depressed with diffuse hypokinesis, worse  in the mid/distal inferior/inferoseptal walls. Left ventricular ejection fraction, by estimation, is 30 to 35%. The left ventricle has moderately decreased function. The left ventricle demonstrates regional wall motion abnormalities. The left ventricular internal cavity size was mildly dilated. There is no left ventricular hypertrophy. Left ventricular diastolic parameters are indeterminate. Right Ventricle: The right ventricular size is mildly enlarged. Right vetricular wall thickness was not assessed. Right ventricular systolic function is moderately reduced. Left Atrium: Left atrial size was normal in size. Right Atrium: Right atrial size was normal in size. Pericardium: There is no evidence of pericardial effusion. Mitral Valve: The mitral valve is normal in structure. Trivial mitral valve regurgitation. Tricuspid Valve: The tricuspid valve is normal in structure. Tricuspid valve regurgitation is trivial. Aortic Valve: The aortic valve is normal in structure. Aortic valve regurgitation is not visualized. Pulmonic Valve: The pulmonic valve was grossly normal. Pulmonic valve regurgitation is trivial. Aorta: The aortic root is normal in size and structure. IAS/Shunts: The interatrial septum was not assessed.  LEFT VENTRICLE PLAX 2D LVIDd:         5.60 cm  Diastology LVIDs:         3.90 cm  LV e' lateral:   8.81 cm/s LV PW:         1.40 cm  LV E/e' lateral: 5.7 LV IVS:        1.10 cm  LV e' medial:    5.87 cm/s LVOT diam:     2.40 cm  LV E/e' medial:  8.5 LV SV:         64 LV SV Index:   27 LVOT Area:     4.52 cm  RIGHT VENTRICLE RV S prime:     9.79 cm/s TAPSE (M-mode): 1.3 cm LEFT ATRIUM             Index       RIGHT ATRIUM           Index LA diam:        3.10 cm 1.31 cm/m  RA Area:     18.80 cm LA Vol (A2C):   62.9 ml 26.55 ml/m RA Volume:   51.80 ml  21.86 ml/m LA Vol (A4C):   40.8 ml 17.22 ml/m LA Biplane Vol: 50.8 ml 21.44 ml/m  AORTIC VALVE LVOT Vmax:   69.80 cm/s LVOT Vmean:  49.400 cm/s LVOT VTI:     0.142 m  AORTA Ao Root diam: 3.60 cm MITRAL VALVE MV Area (PHT): 2.34 cm    SHUNTS MV Decel Time: 324 msec    Systemic VTI:  0.14 m MV E velocity: 50.10 cm/s  Systemic Diam: 2.40 cm MV A velocity: 71.60 cm/s MV E/A ratio:  0.70 Dorris Carnes MD Electronically signed by Dorris Carnes MD Signature Date/Time: 10/12/2019/1:58:41 PM    Final    ECHO TEE  Result Date: 10/24/2019    TRANSESOPHOGEAL ECHO REPORT   Patient Name:   Micheal Bell Date of Exam: 10/24/2019 Medical Rec #:  [id number]      Height:       74.0 in Accession #:    [id number]     Weight:       247.4 lb Date of Birth:  1970/09/25      BSA:          2.379 m Patient Age:    67 years       BP:           120/80 mmHg Patient Gender: M              HR:           100 bpm. Exam Location:  Inpatient Procedure: Transesophageal Echo Indications:    Emobli/Bacteremia  History:        Patient has prior history of Echocardiogram examinations.  Sonographer:    Clayton Lefort RDCS (AE) Referring Phys: [phone] HAO MENG PROCEDURE: The transesophogeal probe was passed without difficulty through the esophogus of the patient. Sedation performed by different physician. The patient's vital signs; including heart rate, blood pressure, and oxygen saturation; remained stable throughout the procedure. The patient developed no complications during the procedure. IMPRESSIONS  1. Left ventricular ejection fraction, by estimation, is 50 to 55%. The left ventricle has low normal function. The left ventricle has no regional wall motion abnormalities.  2. Right ventricular systolic function is normal. The right ventricular size is normal.  3. No left atrial/left atrial appendage thrombus was detected.  4. The mitral valve is normal in structure. Trivial  mitral valve regurgitation. No evidence of mitral stenosis.  5. The aortic valve is normal in structure. Aortic valve regurgitation is not visualized. No aortic stenosis is present.  6. The inferior vena cava is normal in size with greater than  50% respiratory variability, suggesting right atrial pressure of 3 mmHg. Conclusion(s)/Recommendation(s): Normal biventricular function without evidence of hemodynamically significant valvular heart disease. FINDINGS  Left Ventricle: Left ventricular ejection fraction, by estimation, is 50 to 55%. The left ventricle has low normal function. The left ventricle has no regional wall motion abnormalities. The left ventricular internal cavity size was normal in size. There is no left ventricular hypertrophy. Right Ventricle: The right ventricular size is normal. No increase in right ventricular wall thickness. Right ventricular systolic function is normal. Left Atrium: Left atrial size was normal in size. No left atrial/left atrial appendage thrombus was detected. Right Atrium: Right atrial size was normal in size. Pericardium: There is no evidence of pericardial effusion. Mitral Valve: The mitral valve is normal in structure. Normal mobility of the mitral valve leaflets. Trivial mitral valve regurgitation. No evidence of mitral valve stenosis. There is no evidence of mitral valve vegetation. Tricuspid Valve: The tricuspid valve is normal in structure. Tricuspid valve regurgitation is mild . No evidence of tricuspid stenosis. There is no evidence of tricuspid valve vegetation. Aortic Valve: The aortic valve is normal in structure. Aortic valve regurgitation is not visualized. No aortic stenosis is present. There is no evidence of aortic valve vegetation. Pulmonic Valve: The pulmonic valve was normal in structure. Pulmonic valve regurgitation is trivial. No evidence of pulmonic stenosis. Aorta: The aortic root is normal in size and structure. Venous: The inferior vena cava is normal in size with greater than 50% respiratory variability, suggesting right atrial pressure of 3 mmHg. IAS/Shunts: No atrial level shunt detected by color flow Doppler. Jenkins Rouge MD Electronically signed by Jenkins Rouge MD Signature Date/Time:  10/24/2019/3:28:45 PM    Final    VAS US CAROTID (at [facility] and [facility] only)  Result Date: 10/26/2019 Carotid Arterial Duplex Study Indications:       50m focus of diffusion restriction in the left                    frontoparietal lobe concerning for acute/early subacute                    infarct vs cerebritis. Risk Factors:      Hypertension, Diabetes. Other Factors:     Covid-19, bacteremia. Comparison Study:  No prior study on file Performing Technologist: CSharion DoveRVS  Examination Guidelines: A complete evaluation includes B-mode imaging, spectral Doppler, color Doppler, and power Doppler as needed of all accessible portions of each vessel. Bilateral testing is considered an integral part of a complete examination. Limited examinations for reoccurring indications may be performed as noted.  Right Carotid Findings: +----------+--------+--------+--------+------------------+--------+           PSV cm/sEDV cm/sStenosisPlaque DescriptionComments +----------+--------+--------+--------+------------------+--------+ CCA Prox  66      11                                         +----------+--------+--------+--------+------------------+--------+ CCA Distal71      18                                         +----------+--------+--------+--------+------------------+--------+  ICA Prox  58      19                                         +----------+--------+--------+--------+------------------+--------+ ICA Distal90      33                                         +----------+--------+--------+--------+------------------+--------+ ECA       114     16                                         +----------+--------+--------+--------+------------------+--------+ +----------+--------+-------+--------+-------------------+           PSV cm/sEDV cmsDescribeArm Pressure (mmHG) +----------+--------+-------+--------+-------------------+ [id number]                                         +----------+--------+-------+--------+-------------------+ +---------+--------+--+--------+--+ VertebralPSV cm/s38EDV cm/s11 +---------+--------+--+--------+--+  Left Carotid Findings: +----------+--------+--------+--------+------------------+--------+           PSV cm/sEDV cm/sStenosisPlaque DescriptionComments +----------+--------+--------+--------+------------------+--------+ CCA Prox  55      14                                         +----------+--------+--------+--------+------------------+--------+ CCA Distal65      20                                         +----------+--------+--------+--------+------------------+--------+ ICA Prox  68      20                                         +----------+--------+--------+--------+------------------+--------+ ICA Distal76      23                                         +----------+--------+--------+--------+------------------+--------+ ECA       77      3                                          +----------+--------+--------+--------+------------------+--------+ +----------+--------+--------+--------+-------------------+           PSV cm/sEDV cm/sDescribeArm Pressure (mmHG) +----------+--------+--------+--------+-------------------+ [id number]                                          +----------+--------+--------+--------+-------------------+ +---------+--------+--+--------+--+ VertebralPSV cm/s63EDV cm/s18 +---------+--------+--+--------+--+   Summary: Right Carotid: The extracranial vessels were near-normal with only minimal wall                thickening or plaque. Left Carotid: The extracranial vessels were near-normal with only  minimal wall               thickening or plaque. Vertebrals:  Bilateral vertebral arteries demonstrate antegrade flow. Subclavians: Normal flow hemodynamics were seen in bilateral subclavian              arteries. *See table(s) above for measurements and observations.   Electronically signed by Antony Contras MD on 10/26/2019 at 12:37:48 PM.    Final    VAS [place] LOWER EXTREMITY ARTERIAL DUPLEX  Result Date: 10/26/2019 LOWER EXTREMITY ARTERIAL DUPLEX STUDY Indications: Ulceration, and Osteomyelitis. High Risk Factors: Hypertension, Diabetes. Other Factors: Covid-19. Charcot foot.  Current ABI: N/A Comparison Study: No prior study Performing Technologist: Sharion Dove RVS  Examination Guidelines: A complete evaluation includes B-mode imaging, spectral Doppler, color Doppler, and power Doppler as needed of all accessible portions of each vessel. Bilateral testing is considered an integral part of a complete examination. Limited examinations for reoccurring indications may be performed as noted.  +----------+--------+-----+--------+---------+--------+ LEFT      PSV cm/sRatioStenosisWaveform Comments +----------+--------+-----+--------+---------+--------+ CFA Prox  123                  triphasic         +----------+--------+-----+--------+---------+--------+ DFA       51                   triphasic         +----------+--------+-----+--------+---------+--------+ SFA Prox  97                   triphasic         +----------+--------+-----+--------+---------+--------+ SFA Mid   88                   triphasic         +----------+--------+-----+--------+---------+--------+ SFA Distal81                   triphasic         +----------+--------+-----+--------+---------+--------+ POP Prox  60                   triphasic         +----------+--------+-----+--------+---------+--------+ POP Distal86                   triphasic         +----------+--------+-----+--------+---------+--------+ ATA Distal110                  triphasic         +----------+--------+-----+--------+---------+--------+ PTA Prox  34                   triphasic         +----------+--------+-----+--------+---------+--------+ PTA Mid   29                    triphasic         +----------+--------+-----+--------+---------+--------+ PTA [id number]                  triphasic         +----------+--------+-----+--------+---------+--------+  Summary: Left: Normal waveforms and adequate flow noted.  See table(s) above for measurements and observations. Electronically signed by Servando Snare MD on 10/26/2019 at 4:50:14 PM.    Final    VAS [place] LOWER EXTREMITY ARTERIAL DUPLEX  Result Date: 10/19/2019 LOWER EXTREMITY ARTERIAL DUPLEX STUDY Indications: Cold leg, Covid-19.  Current ABI: N/A Comparison Study: No prior study Performing Technologist: Sharion Dove RVS  Examination Guidelines: A complete evaluation  includes B-mode imaging, spectral Doppler, color Doppler, and power Doppler as needed of all accessible portions of each vessel. Bilateral testing is considered an integral part of a complete examination. Limited examinations for reoccurring indications may be performed as noted.  +-----------+--------+-----+--------+---------+--------+ RIGHT      PSV cm/sRatioStenosisWaveform Comments +-----------+--------+-----+--------+---------+--------+ CFA Prox   67                   triphasic         +-----------+--------+-----+--------+---------+--------+ DFA        60                   triphasic         +-----------+--------+-----+--------+---------+--------+ SFA Prox   68                   triphasic         +-----------+--------+-----+--------+---------+--------+ SFA Mid    62                   triphasic         +-----------+--------+-----+--------+---------+--------+ SFA Distal 69                   triphasic         +-----------+--------+-----+--------+---------+--------+ POP Prox   47                   triphasic         +-----------+--------+-----+--------+---------+--------+ POP Distal 42                   triphasic         +-----------+--------+-----+--------+---------+--------+ ATA Distal 15                    triphasic         +-----------+--------+-----+--------+---------+--------+ PTA Prox   31                   triphasic         +-----------+--------+-----+--------+---------+--------+ PTA Mid    38                   triphasic         +-----------+--------+-----+--------+---------+--------+ PTA Distal 38                   triphasic         +-----------+--------+-----+--------+---------+--------+ PERO Distal36                   triphasic         +-----------+--------+-----+--------+---------+--------+  Summary: Right: Normal examination. No evidence of arterial occlusive disease. Normal waveforms, no stenosis.  See table(s) above for measurements and observations. Electronically signed by Monica Martinez MD on 10/19/2019 at 3:25:02 PM.    Final    VAS [place] LOWER EXTREMITY VENOUS (DVT)  Result Date: 10/26/2019  Lower Venous DVTStudy Indications: Covid-19, elevated D-Dimer.  Comparison Study: Prior negative left lower extremity venous duplex don 03/19/17                   and is available for comparison. Performing Technologist: Sharion Dove RVS  Examination Guidelines: A complete evaluation includes B-mode imaging, spectral Doppler, color Doppler, and power Doppler as needed of all accessible portions of each vessel. Bilateral testing is considered an integral part of a complete examination. Limited examinations for reoccurring indications may be performed as noted. The reflux portion of the exam is performed with the patient  in reverse Trendelenburg.  +---------+---------------+---------+-----------+----------+--------------+ RIGHT    CompressibilityPhasicitySpontaneityPropertiesThrombus Aging +---------+---------------+---------+-----------+----------+--------------+ CFV      Full           Yes      Yes                                 +---------+---------------+---------+-----------+----------+--------------+ SFJ      Full                                                         +---------+---------------+---------+-----------+----------+--------------+ FV Prox  Full                                                        +---------+---------------+---------+-----------+----------+--------------+ FV Mid   Full                                                        +---------+---------------+---------+-----------+----------+--------------+ FV DistalFull                                                        +---------+---------------+---------+-----------+----------+--------------+ PFV      Full                                                        +---------+---------------+---------+-----------+----------+--------------+ POP      Partial        Yes      Yes                  Acute          +---------+---------------+---------+-----------+----------+--------------+ PTV      Full                                                        +---------+---------------+---------+-----------+----------+--------------+ PERO     Full                                                        +---------+---------------+---------+-----------+----------+--------------+   +---------+---------------+---------+-----------+----------+--------------+ LEFT     CompressibilityPhasicitySpontaneityPropertiesThrombus Aging +---------+---------------+---------+-----------+----------+--------------+ CFV      Full           Yes      Yes                                 +---------+---------------+---------+-----------+----------+--------------+  SFJ      Full                                                        +---------+---------------+---------+-----------+----------+--------------+ FV Prox  Full                                                        +---------+---------------+---------+-----------+----------+--------------+ FV Mid   Full                                                         +---------+---------------+---------+-----------+----------+--------------+ FV DistalFull                                                        +---------+---------------+---------+-----------+----------+--------------+ PFV      Full                                                        +---------+---------------+---------+-----------+----------+--------------+ POP      Full           Yes      Yes                                 +---------+---------------+---------+-----------+----------+--------------+ PTV      Full                                                        +---------+---------------+---------+-----------+----------+--------------+ PERO     Full                                                        +---------+---------------+---------+-----------+----------+--------------+     Summary: RIGHT: - Findings consistent with acute deep vein thrombosis involving the right popliteal vein. - Ultrasound characteristics of enlarged lymph nodes are noted in the groin.  LEFT: - There is no evidence of deep vein thrombosis in the lower extremity.  *See table(s) above for measurements and observations. Electronically signed by Servando Snare MD on 10/26/2019 at 4:50:27 PM.    Final    [place] EKG SITE RITE  Result Date: 10/12/2019 If Site Rite image not attached, placement could not be confirmed due to current cardiac rhythm.

## 2019-10-30 NOTE — Plan of Care (Signed)
  Problem: Education: Goal: Knowledge of risk factors and measures for prevention of condition will improve Outcome: Progressing   Problem: Coping: Goal: Psychosocial and spiritual needs will be supported Outcome: Progressing   Problem: Respiratory: Goal: Will maintain a patent airway Outcome: Progressing   Problem: Education: Goal: Knowledge of General Education information will improve Description: Including pain rating scale, medication(s)/side effects and non-pharmacologic comfort measures Outcome: Progressing   Problem: Clinical Measurements: Goal: Respiratory complications will improve Outcome: Progressing   Problem: Activity: Goal: Risk for activity intolerance will decrease Outcome: Progressing   Problem: Coping: Goal: Level of anxiety will decrease Outcome: Progressing   Problem: Pain Managment: Goal: General experience of comfort will improve Outcome: Progressing   Problem: Safety: Goal: Ability to remain free from injury will improve Outcome: Progressing   Problem: Education: Goal: Knowledge of disease or condition will improve Outcome: Progressing Goal: Knowledge of secondary prevention will improve Outcome: Progressing   Problem: Coping: Goal: Will verbalize positive feelings about self Outcome: Progressing Goal: Will identify appropriate support needs Outcome: Progressing   Problem: Self-Care: Goal: Verbalization of feelings and concerns over difficulty with self-care will improve Outcome: Progressing Goal: Ability to communicate needs accurately will improve Outcome: Progressing   Problem: Nutrition: Goal: Risk of aspiration will decrease Outcome: Progressing Goal: Dietary intake will improve Outcome: Progressing   Problem: Ischemic Stroke/TIA Tissue Perfusion: Goal: Complications of ischemic stroke/TIA will be minimized Outcome: Progressing

## 2019-10-30 NOTE — Progress Notes (Signed)
Physical Therapy Treatment Patient Details Name: Micheal Bell MRN: [id number] DOB: February 12, 1971 Today's Date: 10/30/2019    History of Present Illness Pt is a 49 y.o. male admitted 10/09/19 for COVID PNA. Pt decompensated 8/19 and intubated; self extubated 8/23 and reintubated; extubated 8/28. Course complicated by MSSA and GBS bacteria, sepsis, systolic HF, acute metabolic encephalopathy. 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 TEE without evidence of vegitation. 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    PT Comments    Pt continues to make steady progress towards goals. Continue to feel patient appropriate for CIR.    Follow Up Recommendations  CIR;Supervision/Assistance - 24 hour     Equipment Recommendations   (TBD)    Recommendations for Other Services       Precautions / Restrictions Precautions Precautions: Fall;Other (comment) Precaution Comments: wounds on feet; bowel incontinence    Mobility  Bed Mobility Overal bed mobility: Needs Assistance Bed Mobility: Supine to Sit     Supine to sit: Max assist;+2 for safety/equipment     General bed mobility comments: Pt able to move feet off of bed with cues but heavy assist to bring trunk up into sitting. Pt able to assist scooting hips to EOB.  Transfers Overall transfer level: Needs assistance   Transfers: Sit to/from Stand Sit to Stand: Mod assist;+2 physical assistance;From elevated surface         General transfer comment: Assist to bring hips up and for balance. Used Stedy for transfer to chair. Attempted to stand from chair with walker. Pt with difficulty processing hand placement and was moving laterally forward off of chair seat instead of  upwards. Unable to stand with +2 max with walker from chair.   Ambulation/Gait                 Stairs             Wheelchair Mobility    Modified Rankin (Stroke Patients Only)       Balance Overall balance assessment: Needs assistance Sitting-balance support: Bilateral upper extremity supported;Feet supported Sitting balance-Leahy Scale: Poor Sitting balance - Comments: Sat EOB with UE support and min guard assist.    Standing balance support: During functional activity;Bilateral upper extremity supported Standing balance-Leahy Scale: Poor Standing balance comment: Pt stood in Stedy x 1 minute with min assist for balance.                             Cognition Arousal/Alertness: Awake/alert Behavior During Therapy: WFL for tasks assessed/performed;Flat affect Overall Cognitive Status: Impaired/Different from baseline Area of Impairment: Orientation;Attention;Memory;Following commands;Safety/judgement;Awareness;Problem solving                 Orientation Level: Disoriented to;Time Current Attention Level: Sustained Memory: Decreased short-term memory Following Commands: Follows one step commands with increased time;Follows one step commands inconsistently Safety/Judgement: Decreased awareness of safety;Decreased awareness of deficits Awareness: Intellectual Problem Solving: Slow processing;Decreased initiation;Requires tactile cues General Comments: Requires frequent cues for tasks. Believes he would be fine and able to walk if he had his boots.       Exercises      General Comments General comments (skin integrity, edema, etc.): VSS on RA      Pertinent Vitals/Pain  Pain Assessment: Faces Faces Pain Scale: Hurts a little bit Pain Location: LLE Pain Descriptors / Indicators: Burning Pain Intervention(s): Monitored during session    Home Living                      Prior Function            PT Goals (current goals can  now be found in the care plan section) Progress towards PT goals: Progressing toward goals    Frequency    Min 4X/week      PT Plan Current plan remains appropriate    Co-evaluation              AM-PAC PT "6 Clicks" Mobility   Outcome Measure  Help needed turning from your back to your side while in a flat bed without using bedrails?: A Lot Help needed moving from lying on your back to sitting on the side of a flat bed without using bedrails?: A Lot Help needed moving to and from a bed to a chair (including a wheelchair)?: Total Help needed standing up from a chair using your arms (e.g., wheelchair or bedside chair)?: Total Help needed to walk in hospital room?: Total Help needed climbing 3-5 steps with a railing? : Total 6 Click Score: 8    End of Session Equipment Utilized During Treatment: Gait belt Activity Tolerance: Patient tolerated treatment well Patient left: in chair;with call bell/phone within reach;with chair alarm set Nurse Communication: Mobility status;Need for lift equipment (secure chat with nurse and nurse tech) PT Visit Diagnosis: Other abnormalities of gait and mobility (R26.89);Muscle weakness (generalized) (M62.81);Other symptoms and signs involving the nervous system (R29.898)     Time: 2703-5009 PT Time Calculation (min) (ACUTE ONLY): 23 min  Charges:  $Therapeutic Activity: 23-37 mins                     [facility] PT Acute Rehabilitation Services Pager [phone] Office [phone]    Angelina Ok [facility] 10/30/2019, 3:39 PM

## 2019-10-30 NOTE — Plan of Care (Signed)
  Problem: Education: Goal: Knowledge of risk factors and measures for prevention of condition will improve Outcome: Progressing   Problem: Coping: Goal: Psychosocial and spiritual needs will be supported Outcome: Progressing   Problem: Respiratory: Goal: Will maintain a patent airway Outcome: Progressing   Problem: Education: Goal: Knowledge of General Education information will improve Description: Including pain rating scale, medication(s)/side effects and non-pharmacologic comfort measures Outcome: Progressing   Problem: Clinical Measurements: Goal: Respiratory complications will improve Outcome: Progressing   Problem: Activity: Goal: Risk for activity intolerance will decrease Outcome: Progressing   Problem: Coping: Goal: Level of anxiety will decrease Outcome: Progressing   Problem: Pain Managment: Goal: General experience of comfort will improve Outcome: Progressing   Problem: Safety: Goal: Ability to remain free from injury will improve Outcome: Progressing

## 2019-10-30 NOTE — Consult Note (Addendum)
Physical Medicine and Rehabilitation Consult   Reason for Consult: Functional deficits due to debility.  Referring Physician: Dr. Thedore Mins   HPI: Micheal Bell is a 49 y.o. male with history of T2DM, IBS, OSA, fibromyalgia- chronic pain;  who was admitted on 10/09/19 with Covid PNA and placed on HTNC due to tachypnea. Broad spectrum antibiotics added for Staph UTI with strep bacteremia. He was  treated with steroids, Baricitinib and Remdesivir. He continued to have increase WOB due to ARDS requiring intubation 08/19 as well as diuresis for acute on chronic CHF.  2 D echo showed EF 30-35% with moderate decrease in LVF. ID consulted for input and patient found to have purulent drainage from plantar ulcer felt to be the source of bacteremia. He briefly self extubated on 08/23, was reintubated and tolerated extubation by 08/28. He did develop tachycardia with tachypnea and worsening of leucocytosis on 08/30.  BLE dopplers done due to elevated d-dimer and revealed acute  DVT in right popliteal vein. CTA chest was negative for PE and showed multifocal pulmonary infiltrates likely inflammatory v/s infectious.   Cardiology consulted for input on new heart failure/CM and recommended  TEE to rule out covid myocarditis. TEE showed EF 50-55% and was negative for thrombus, vegetation or valvular dz.  He was noted to be encephalopathic with cognitive deficits and R>L sided weakness on therapy evaluation. Ortho consulted for input on right foot wound and MRI right foot showed sinus fluid filled tract at 5th MT base felt to be abscess measuring 5.4X 0.9X 3.4 cm as well as question of early osteomyelitis base of 4th and 5th MT.  Dr. [name] recommended transitioning patient to oral doxycycline past completing course of IV antibiotics and to follow up post discharge.   MRI brain showed restricted diffusion with FLAIR hyperintensity within subcortical left frontoparietal lobes concerning for acute/subacute cerebritis.  Neurology recommended change in antibiotics for better coverage and Nafcillin added with recommendations of 4 week treatment --end date of 09/29 and to consider Keflex at that time if infection still present in the foot. LT_EEG ordered for work up of somnolence and showed brief ictal-interictal discharges bifrontocentral region and Keppra added with resolution of seizures.  Melatonin also added for delirum/sleep-wake disturbance. Therapy ongoing and patient limited by diffuse weakness as well as cognitive deficits. CIR recommended due to functional decline.  .   Review of Systems  Constitutional: Negative for chills and fever.  HENT: Negative for hearing loss and tinnitus.   Eyes: Negative for blurred vision and double vision.  Respiratory: Negative for cough and hemoptysis.   Cardiovascular: Negative for chest pain and palpitations.  Gastrointestinal: Negative for heartburn and nausea.  Genitourinary: Negative for dysuria and urgency.  Musculoskeletal: Negative for myalgias and neck pain.  Skin: Negative for itching and rash.  Neurological: Negative for headaches.  Endo/Heme/Allergies: Negative for environmental allergies. Does not bruise/bleed easily.  Psychiatric/Behavioral: Negative for depression and suicidal ideas.      Past Medical History:  Diagnosis Date  . Arthritis   . Asthma   . CHF (congestive heart failure) (HCC)   . Diabetes mellitus without complication (HCC)   . Fibromyalgia   . Gout   . Hypertension   . IBS (irritable bowel syndrome)   . Vein disorder     Past Surgical History:  Procedure Laterality Date  . FOOT SURGERY    . TEE WITHOUT CARDIOVERSION N/A 10/24/2019   Procedure: TRANSESOPHAGEAL ECHOCARDIOGRAM (TEE);  Surgeon: [name], MD;  Location: [facility];  Service: Cardiovascular;  Laterality: N/A;    No family history on file.    Social History: Lives with mother and independent PTA. Unemployed.  reports that he has never smoked. He has never  used smokeless tobacco. He reports that he does not drink alcohol and does not use drugs.   Allergies  Allergen Reactions  . Azithromycin   . Lodine [Etodolac] Anaphylaxis    Medications Prior to Admission  Medication Sig Dispense Refill  . allopurinol (ZYLOPRIM) 300 MG tablet Take 300 mg by mouth daily.    Marland Kitchen buPROPion (WELLBUTRIN XL) 150 MG 24 hr tablet Take 150 mg by mouth every morning.    . chlorthalidone (HYGROTON) 25 MG tablet Take 25 mg by mouth daily.    . citalopram (CELEXA) 40 MG tablet Take 40 mg by mouth daily.    . cyclobenzaprine (FLEXERIL) 10 MG tablet Take 10 mg by mouth 3 (three) times daily as needed for muscle spasms.    . furosemide (LASIX) 20 MG tablet Take 20 mg by mouth.    Marland Kitchen glipiZIDE (GLUCOTROL) 10 MG tablet Take 10 mg by mouth daily before breakfast.    . HYDROcodone-acetaminophen (NORCO/VICODIN) 5-325 MG tablet Take 1 tablet by mouth every 6 (six) hours as needed for moderate pain.    . meloxicam (MOBIC) 15 MG tablet Take 15 mg by mouth daily.    . montelukast (SINGULAIR) 10 MG tablet Take 10 mg by mouth at bedtime.    . nitroGLYCERIN (NITRODUR - DOSED IN MG/24 HR) 0.2 mg/hr patch Place 0.2 mg onto the skin daily.    . nortriptyline (PAMELOR) 25 MG capsule Take 25 mg by mouth at bedtime.    . pantoprazole (PROTONIX) 40 MG tablet Take 40 mg by mouth daily.    . pioglitazone (ACTOS) 15 MG tablet Take 15 mg by mouth daily.    . predniSONE (DELTASONE) 10 MG tablet Take 10 mg by mouth daily with breakfast.    . verapamil (CALAN-SR) 240 MG CR tablet Take 240 mg by mouth at bedtime.    Marland Kitchen zolpidem (AMBIEN) 10 MG tablet Take 10 mg by mouth at bedtime as needed for sleep.    Marland Kitchen albuterol (PROVENTIL HFA;VENTOLIN HFA) 108 (90 Base) MCG/ACT inhaler Inhale into the lungs every 6 (six) hours as needed for wheezing or shortness of breath.    . Fluticasone Propionate, Inhal, (FLOVENT IN) Inhale into the lungs.    Marland Kitchen ibuprofen (ADVIL) 600 MG tablet Take 600 mg by mouth every 6  (six) hours as needed (pain).     Marland Kitchen lidocaine (LIDODERM) 5 % Place 1 patch onto the skin daily. Remove & Discard patch within 12 hours or as directed by MD 30 patch 0  . mometasone-formoterol (DULERA) 100-5 MCG/ACT AERO Inhale 2 puffs into the lungs 2 (two) times daily.    . traMADol (ULTRAM) 50 MG tablet Take 1 tablet (50 mg total) by mouth every 6 (six) hours as needed. (Patient not taking: Reported on 10/12/2019) 15 tablet 0    Home: Home Living Family/patient expects to be discharged to:: Private residence Living Arrangements: Alone Available Help at Discharge: Family, Available PRN/intermittently Type of Home: Other(Comment) Home Access: Ramped entrance Home Layout: One level Bathroom Shower/Tub: Engineer, manufacturing systems: Standard Home Equipment: None Additional Comments: Lives alone with his dog Sparky. Camper is parked behind his mother's house  Lives With: Family  Functional History: Prior Function Level of Independence: Independent Comments: Reports he was caring for his grandfather  Functional Status:  Mobility: Bed Mobility Overal bed mobility: Needs Assistance Bed Mobility: Supine to Sit Rolling: Mod assist Sidelying to sit: Max assist, +2 for physical assistance Supine to sit: Max assist, +2 for safety/equipment Sit to supine: Max assist, +2 for physical assistance General bed mobility comments: Max, repeated cues to initiate movement and stay on task; assist for RLE to EOB, maxA with bilateral HHA to elevate trunk (+2 safety), heavy reliance on BUE support with poor core control Transfers Overall transfer level: Needs assistance Transfer via Lift Equipment: Stedy Transfers: Sit to/from Stand Sit to Stand: Max assist, +2 physical assistance, From elevated surface General transfer comment: Performed multiple sit<>stands in [place] frame with maxA+2 to elevate trunk; pt with improving initiation of forward trunk translation and hip extension, intermittent verbal  cues for this; R-lateral lean in standing. Transfer to recliner in [place] Ambulation/Gait General Gait Details: Unable    ADL: ADL Overall ADL's : Needs assistance/impaired Eating/Feeding: Supervision/ safety, Set up, Sitting Eating/Feeding Details (indicate cue type and reason): Supervision and set up to eat breakfast. Limited ROM at shoulder, so bowels and cups needing to be close to pt's chest for reach.  Grooming: Moderate assistance, Sitting Upper Body Bathing: Maximal assistance, Sitting Lower Body Bathing: Maximal assistance, Bed level Lower Body Bathing Details (indicate cue type and reason): Pt washing upper thighs/can reach groin Upper Body Dressing : Maximal assistance Lower Body Dressing: Total assistance Lower Body Dressing Details (indicate cue type and reason): don socks Toilet Transfer: Maximal assistance, +2 for physical assistance, +2 for safety/equipment (sit<>stand with stedy) Toileting- Clothing Manipulation and Hygiene: Total assistance Toileting - Clothing Manipulation Details (indicate cue type and reason): Total A for peri care after BM Functional mobility during ADLs: Maximal assistance, +2 for physical assistance, +2 for safety/equipment (sit<>stand with use of stedy) General ADL Comments: Pt presenting with decreased balance, strength, cognition, and acitivty tolerance  Cognition: Cognition Overall Cognitive Status: Impaired/Different from baseline Arousal/Alertness: Awake/alert Orientation Level: Oriented X4 Attention: Sustained, Focused (suspect higher level attention difficulties) Focused Attention: Appears intact Sustained Attention: Appears intact Memory: Impaired Memory Impairment: Decreased recall of new information, Retrieval deficit Awareness: Impaired Awareness Impairment: Intellectual impairment, Emergent impairment, Anticipatory impairment Problem Solving: Impaired Problem Solving Impairment: Verbal complex, Functional complex Executive  Function: Reasoning, Organizing, Decision Making, Self Correcting, Self Monitoring Reasoning: Impaired Reasoning Impairment: Functional complex, Verbal complex Organizing: Impaired Organizing Impairment: Verbal complex, Functional complex Decision Making: Impaired Decision Making Impairment: Verbal complex, Functional complex Self Monitoring: Impaired Self Monitoring Impairment: Verbal complex, Functional complex Self Correcting: Impaired Self Correcting Impairment: Verbal complex, Functional complex Safety/Judgment: Impaired Cognition Arousal/Alertness: Awake/alert Behavior During Therapy: WFL for tasks assessed/performed, Flat affect Overall Cognitive Status: Impaired/Different from baseline Area of Impairment: Orientation, Attention, Memory, Following commands, Safety/judgement, Awareness, Problem solving Orientation Level: Disoriented to, Time Current Attention Level: Sustained Memory: Decreased short-term memory Following Commands: Follows one step commands with increased time, Follows one step commands inconsistently Safety/Judgement: Decreased awareness of safety, Decreased awareness of deficits Awareness: Intellectual Problem Solving: Slow processing, Decreased initiation, Requires tactile cues General Comments: Requires frequent redirection to task and cues to initiate. Despite decreased orientation and poor problem solving, pt very motivated to participate in therapy. Poor awareness as seen by pt reporting he can get to bathroom by himself after just using stedy due to weakness.  Blood pressure 117/61, pulse 87, temperature 97.9 F (36.6 C), temperature source Oral, resp. rate 19, height 6\' 2"  (1.88 m), weight 112.9 kg, SpO2 95 %. Physical Exam  General:  Obese, No apparent distress HEENT: Head is normocephalic, atraumatic, PERRLA, EOMI, sclera anicteric, oral mucosa pink and moist, dentition intact, ext ear canals clear,  Neck: Supple without JVD or lymphadenopathy Heart:  Reg rate and rhythm. No murmurs rubs or gallops Chest: CTA bilaterally without wheezes, rales, or rhonchi; no distress Abdomen: Soft, non-tender, non-distended, bowel sounds positive. Extremities: No clubbing, cyanosis, or edema. Pulses are 2+ Skin: Clean and intact without signs of breakdown Neuro: Poor insight and awareness of deficits. RLE significantly weak. Testing limited by patient being changed. Antigravity in other extremities Psych: Pt's affect is appropriate. Pt is cooperative   Results for orders placed or performed during the hospital encounter of 10/09/19 (from the past 24 hour(s))  Glucose, capillary     Status: Abnormal   Collection Time: 10/29/19 12:23 PM  Result Value Ref Range   Glucose-Capillary 199 (H) 70 - 99 mg/dL  Glucose, capillary     Status: Abnormal   Collection Time: 10/29/19  4:55 PM  Result Value Ref Range   Glucose-Capillary 250 (H) 70 - 99 mg/dL  Glucose, capillary     Status: Abnormal   Collection Time: 10/29/19  7:58 PM  Result Value Ref Range   Glucose-Capillary 214 (H) 70 - 99 mg/dL  Glucose, capillary     Status: Abnormal   Collection Time: 10/29/19 11:50 PM  Result Value Ref Range   Glucose-Capillary 196 (H) 70 - 99 mg/dL  Procalcitonin     Status: None   Collection Time: 10/30/19  3:20 AM  Result Value Ref Range   Procalcitonin <0.10 ng/mL  Glucose, capillary     Status: Abnormal   Collection Time: 10/30/19  4:01 AM  Result Value Ref Range   Glucose-Capillary 128 (H) 70 - 99 mg/dL  Glucose, capillary     Status: Abnormal   Collection Time: 10/30/19  7:21 AM  Result Value Ref Range   Glucose-Capillary 127 (H) 70 - 99 mg/dL   MR ANGIO HEAD WO CONTRAST  Result Date: 10/29/2019 CLINICAL DATA:  Neuro deficit with stroke suspected EXAM: MRA HEAD WITHOUT CONTRAST TECHNIQUE: Angiographic images of the Circle of Willis were obtained using MRA technique without intravenous contrast. COMPARISON:  Four days ago FINDINGS: History of bacteremia and  embolic infarction. No vessel beading, stenosis, or pseudoaneurysm. IMPRESSION: Negative intracranial MRA. Electronically Signed   By: Marnee Spring M.D.   On: 10/29/2019 12:04    Assessment/Plan: Diagnosis: Acute hypoxic respiratory failure due to COVID 19 1. Does the need for close, 24 hr/day medical supervision in concert with the patient's rehab needs make it unreasonable for this patient to be served in a less intensive setting? Yes 2. Co-Morbidities requiring supervision/potential complications: sepsis with MSAA and group B strep bacteremia due to left foot abscess, severe metabolic encephalopathy, prior left frontal lobe CVA, cerebritis, ongoing seizures 3. Due to bladder management, bowel management, safety, skin/wound care, disease management, medication administration, pain management and patient education, does the patient require 24 hr/day rehab nursing? Yes 4. Does the patient require coordinated care of a physician, rehab nurse, therapy disciplines of PT, OT, SLP to address physical and functional deficits in the context of the above medical diagnosis(es)? Yes Addressing deficits in the following areas: balance, endurance, locomotion, strength, transferring, bowel/bladder control, bathing, dressing, feeding, grooming, toileting, cognition and psychosocial support 5. Can the patient actively participate in an intensive therapy program of at least 3 hrs of therapy per day at least 5 days per week? Yes 6. The potential for patient to  make measurable gains while on inpatient rehab is good 7. Anticipated functional outcomes upon discharge from inpatient rehab are min assist  with PT, min assist with OT, min assist with SLP. 8. Estimated rehab length of stay to reach the above functional goals is: 17-18 days 9. Anticipated discharge destination: Home 10. Overall Rehab/Functional Prognosis: good  RECOMMENDATIONS: This patient's condition is appropriate for continued rehabilitative care in  the following setting: CIR Patient has agreed to participate in recommended program. No Note that insurance prior authorization may be required for reimbursement for recommended care.  Comment: Micheal Bell has not agreed to CIR but does not have capacity to make his own decisions. Parents whom he lives with should be involved in decision making process. He would be excellent CIR candidate once medically ready and in compliance with our COVID guidelines. Thank you for this consult. Admission coordinator to follow.   I have personally performed a face to face diagnostic evaluation, including, but not limited to relevant history and physical exam findings, of this patient and developed relevant assessment and plan.  Additionally, I have reviewed and concur with the physician assistant's documentation above.  Sula Soda, MD   Jacquelynn Cree, PA-C 10/30/2019

## 2019-10-31 LAB — CBC WITH DIFFERENTIAL/PLATELET
Abs Immature Granulocytes: 0.08 10*3/uL — ABNORMAL HIGH (ref 0.00–0.07)
Basophils Absolute: 0 10*3/uL (ref 0.0–0.1)
Basophils Relative: 1 %
Eosinophils Absolute: 0.3 10*3/uL (ref 0.0–0.5)
Eosinophils Relative: 3 %
HCT: 30.5 % — ABNORMAL LOW (ref 39.0–52.0)
Hemoglobin: 10.1 g/dL — ABNORMAL LOW (ref 13.0–17.0)
Immature Granulocytes: 1 %
Lymphocytes Relative: 28 %
Lymphs Abs: 2.3 10*3/uL (ref 0.7–4.0)
MCH: 30.4 pg (ref 26.0–34.0)
MCHC: 33.1 g/dL (ref 30.0–36.0)
MCV: 91.9 fL (ref 80.0–100.0)
Monocytes Absolute: 0.6 10*3/uL (ref 0.1–1.0)
Monocytes Relative: 7 %
Neutro Abs: 5 10*3/uL (ref 1.7–7.7)
Neutrophils Relative %: 60 %
Platelets: 338 10*3/uL (ref 150–400)
RBC: 3.32 MIL/uL — ABNORMAL LOW (ref 4.22–5.81)
RDW: 12.5 % (ref 11.5–15.5)
WBC: 8.3 10*3/uL (ref 4.0–10.5)
nRBC: 0 % (ref 0.0–0.2)

## 2019-10-31 LAB — COMPREHENSIVE METABOLIC PANEL
ALT: 17 U/L (ref 0–44)
AST: 18 U/L (ref 15–41)
Albumin: 2.1 g/dL — ABNORMAL LOW (ref 3.5–5.0)
Alkaline Phosphatase: 85 U/L (ref 38–126)
Anion gap: 9 (ref 5–15)
BUN: 6 mg/dL (ref 6–20)
CO2: 27 mmol/L (ref 22–32)
Calcium: 8.9 mg/dL (ref 8.9–10.3)
Chloride: 98 mmol/L (ref 98–111)
Creatinine, Ser: 0.59 mg/dL — ABNORMAL LOW (ref 0.61–1.24)
GFR calc Af Amer: >60 mL/min (ref >60)
GFR calc non Af Amer: >60 mL/min (ref >60)
Glucose, Bld: 139 mg/dL — ABNORMAL HIGH (ref 70–99)
Potassium: 3.7 mmol/L (ref 3.5–5.1)
Sodium: 134 mmol/L — ABNORMAL LOW (ref 135–145)
Total Bilirubin: 0.7 mg/dL (ref 0.3–1.2)
Total Protein: 6.2 g/dL — ABNORMAL LOW (ref 6.5–8.1)

## 2019-10-31 LAB — GLUCOSE, CAPILLARY
Glucose-Capillary: 118 mg/dL — ABNORMAL HIGH (ref 70–99)
Glucose-Capillary: 122 mg/dL — ABNORMAL HIGH (ref 70–99)
Glucose-Capillary: 140 mg/dL — ABNORMAL HIGH (ref 70–99)
Glucose-Capillary: 160 mg/dL — ABNORMAL HIGH (ref 70–99)
Glucose-Capillary: 164 mg/dL — ABNORMAL HIGH (ref 70–99)
Glucose-Capillary: 212 mg/dL — ABNORMAL HIGH (ref 70–99)

## 2019-10-31 LAB — MAGNESIUM: Magnesium: 1.3 mg/dL — ABNORMAL LOW (ref 1.7–2.4)

## 2019-10-31 MED ORDER — MAGNESIUM SULFATE 4 GM/100ML IV SOLN
4.0000 g | Freq: Once | INTRAVENOUS | Status: AC
  Administered 2019-10-31: 4 g via INTRAVENOUS
  Filled 2019-10-31: qty 100

## 2019-10-31 MED ORDER — SODIUM CHLORIDE 0.9 % IV SOLN: 12.0000 g | INTRAVENOUS | Status: DC

## 2019-10-31 NOTE — Progress Notes (Signed)
Nutrition Follow-up  DOCUMENTATION CODES:   Not applicable  INTERVENTION:  Continue Ensure Enlive po TID, each supplement provides 350 kcal and 20 grams of protein.  Encourage adequate PO intake.   NUTRITION DIAGNOSIS:   Inadequate oral intake related to acute illness as evidenced by NPO status; diet advanced; progressing  GOAL:   Patient will meet greater than or equal to 90% of their needs; progressing  MONITOR:   Supplement acceptance, PO intake, Skin, Weight trends, Labs, I & O's  REASON FOR ASSESSMENT:   Ventilator    ASSESSMENT:   49 yo male admitted with COVID 19 pneumonia requiring intubation. PMH includes HTN, gout, DM, CHF, IBS  8/16 Admitted to [facility] 8/19 Transferred to [facility], Intubated 8/25 Cortrak placed, gastric 8/28 Extubated 8/31 Cortrak dislodged during TEE 9/01 Diet advanced   Meal completion has been 50-100%. Pt currently has Ensure ordered and has been consuming them. RD to continue with current orders to aid in caloric and protein needs. Labs and medications reviewed.   Diet Order:   Diet Order            Diet heart healthy/carb modified Room service appropriate? No; Fluid consistency: Thin  Diet effective now                 EDUCATION NEEDS:   Not appropriate for education at this time  Skin:  Skin Assessment: Reviewed RN Assessment Skin Integrity Issues:: Diabetic Ulcer Diabetic Ulcer: L. Foot after stepping on nail 2-3 weeks ago Other: wound to L. Foot after stepping on nail 2-3 weeks ago  Last BM:  9/7  Height:   Ht Readings from Last 1 Encounters:  10/24/19 6\' 2"  (1.88 m)    Weight:   Wt Readings from Last 1 Encounters:  10/31/19 114.2 kg   BMI:  Body mass index is 32.32 kg/m.  Estimated Nutritional Needs:   Kcal:  2600-2800 kcals  Protein:  130-150 g  Fluid:  >/= 2 L  12/31/19, MS, RD, LDN RD pager number/after hours weekend pager number on Amion.

## 2019-10-31 NOTE — TOC Progression Note (Signed)
Transition of Care [facility]) - Progression Note    Patient Details  Name: Micheal Bell MRN: [id number] Date of Birth: Dec 12, 1970  [facility] CM/SW Contact  Lockie Pares, RN Phone Number: 10/31/2019, 12:28 PM  Clinical Narrative:    Patient is being considered for CIR. His sister stated they may  have received a letter indicating full Medicaid.. If cannot get into CIR or SNF will have to have care at home with assistance, 24 hour supervision, family is aware of this. Will continue to follow for discharge needs.   Expected Discharge Plan: Skilled Nursing Facility Barriers to Discharge: Continued Medical Work up  Expected Discharge Plan and Services Expected Discharge Plan: Skilled Nursing Facility In-house Referral: Clinical Social Work Discharge Planning Services: CM Consult                                           Social Determinants of Health (SDOH) Interventions    Readmission Risk Interventions No flowsheet data found.

## 2019-10-31 NOTE — Progress Notes (Addendum)
Physical Therapy Treatment Patient Details Name: Micheal Bell MRN: [id number] DOB: [birth year] Today's Date: 10/31/2019    History of Present Illness Pt is a 49 y.o. male admitted 10/09/19 for COVID PNA. Pt decompensated 8/19 and intubated; self extubated 8/23 and reintubated; extubated 8/28. Course complicated by MSSA and GBS bacteria, sepsis, systolic HF, acute metabolic encephalopathy. 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 TEE without evidence of vegitation. 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    PT Comments    Pt requesting to get out of recliner and use the commode. Was able to get on [facility] with use of Stedy. Continue to recommend CIR.    Follow Up Recommendations  CIR;Supervision/Assistance - 24 hour     Equipment Recommendations       Recommendations for Other Services       Precautions / Restrictions Precautions Precautions: Fall;Other (comment) Precaution Comments: wounds on feet Restrictions Weight Bearing Restrictions: No    Mobility  Bed Mobility Overal bed mobility: Needs Assistance Bed Mobility: Sit to Supine      Sit to supine: Min assist   General bed mobility comments: Assist to bring legs back up into bed.  Transfers Overall transfer level: Needs assistance Equipment used: Ambulation equipment used Transfers: Sit to/from Stand Sit to Stand: Mod assist;Max assist;+2 physical assistance         General transfer comment: +2 max assist to rise from low recliner with Stedy. Recliner to bsc with Stedy. Mod assist to stand from bsc. BSC to bed with Stedy. Min assist to rise from the elevated seat of Stedy  Ambulation/Gait                 Stairs              Wheelchair Mobility    Modified Rankin (Stroke Patients Only) Modified Rankin (Stroke Patients Only) Pre-Morbid Rankin Score: No symptoms Modified Rankin: Severe disability     Balance Overall balance assessment: Needs assistance Sitting-balance support: Bilateral upper extremity supported;Feet supported Sitting balance-Leahy Scale: Poor Sitting balance - Comments: Sat EOB with UE support and min guard assist.    Standing balance support: During functional activity;Bilateral upper extremity supported Standing balance-Leahy Scale: Poor Standing balance comment: Stedy and min assist for static standing. Stood x 4 with [employer].                            Cognition Arousal/Alertness: Awake/alert Behavior During Therapy: WFL for tasks assessed/performed;Flat affect Overall Cognitive Status: Impaired/Different from baseline Area of Impairment: Orientation;Attention;Memory;Following commands;Safety/judgement;Awareness;Problem solving                 Orientation Level: Disoriented to;Time;Situation Current Attention Level: Sustained Memory: Decreased short-term memory Following Commands: Follows one step commands with increased time Safety/Judgement: Decreased awareness of safety;Decreased awareness of deficits Awareness: Intellectual Problem Solving: Slow processing;Decreased initiation;Requires tactile cues General Comments: Pt with improved appropriate conversation today though tangential by end of session. Decreased awareness of deficits and problem solving      Exercises      General Comments General comments (skin integrity, edema, etc.): RHR 112. HR with activity 142. On RA with SpO2 > 92%.       Pertinent Vitals/Pain Pain Assessment: No/denies pain Faces  Pain Scale: No hurt Pain Intervention(s): Monitored during session    Home Living                      Prior Function            PT Goals (current goals can now be found in the  care plan section) Acute Rehab PT Goals Patient Stated Goal: be able to use toilet Progress towards PT goals: Progressing toward goals    Frequency    Min 4X/week      PT Plan Current plan remains appropriate    Co-evaluation PT/OT/SLP Co-Evaluation/Treatment: Yes Reason for Co-Treatment: Complexity of the patient's impairments (multi-system involvement);For patient/therapist safety PT goals addressed during session: Mobility/safety with mobility;Proper use of DME OT goals addressed during session: ADL's and self-care      AM-PAC PT "6 Clicks" Mobility   Outcome Measure  Help needed turning from your back to your side while in a flat bed without using bedrails?: A Lot Help needed moving from lying on your back to sitting on the side of a flat bed without using bedrails?: A Lot Help needed moving to and from a bed to a chair (including a wheelchair)?: Total Help needed standing up from a chair using your arms (e.g., wheelchair or bedside chair)?: A Lot Help needed to walk in hospital room?: Total Help needed climbing 3-5 steps with a railing? : Total 6 Click Score: 9    End of Session Equipment Utilized During Treatment: Gait belt Activity Tolerance: Patient tolerated treatment well Patient left: with call bell/phone within reach;in bed;with bed alarm set Nurse Communication: Mobility status;Need for lift equipment PT Visit Diagnosis: Other abnormalities of gait and mobility (R26.89);Muscle weakness (generalized) (M62.81);Other symptoms and signs involving the nervous system (R29.898)     Time: 0051-1021 PT Time Calculation (min) (ACUTE ONLY): 37 min  Charges:  $Therapeutic Activity: 23-37 mins                     [facility] PT Acute Rehabilitation Services Pager [phone] Office [phone]    Angelina Ok [facility] 10/31/2019, 1:13 PM

## 2019-10-31 NOTE — Progress Notes (Signed)
PROGRESS NOTE                                                                                                                                                                                                             Patient Demographics:    Micheal Bell, is a 49 y.o. male, DOB - 1970/08/18, KTG:[id number]  Outpatient Primary MD for the patient is [name], MD    LOS - 19  Admit date - 10/09/2019    Chief Complaint  Patient presents with  . Cough       Brief Narrative - 49 yo male with hx of HTN, T2DM, gout, IBS, fibromyalgia, obesity who presented on 8/16 for COVID pneumonia.  He decompensated on 8/19 and was intubated.  Self extubated on 8/23 and was reintubated.  He's subsequently been extubated on 8/28.  He's been on typical therapies for covid including steroids, baricitinib and remdesivir.  His hospitalization was complicated by MSSA and GBS bacteria.  Also found to have systolic heart failure.  Transferred to myservice on 10/25/19   Subjective:   Patient in bed, appears comfortable, denies any headache, no fever, no chest pain or pressure, no shortness of breath , no abdominal pain. No focal weakness.   Assessment  & Plan :     1. Acute Hypoxic Resp. Failure due to Acute Covid 19 Viral Pneumonitis during the ongoing 2020 Covid 19 Pandemic - he had severe hypoxia and parenchymal lung injury he was intubated and admitted by ICU, he was started on IV steroids, remdesivir and Baricitinib on 10/12/2019.  He was subsequently extubated on 10/21/2019.  His stay was complicated by bacteremia after which Baricitinib was discontinued on 10/24/2019.  His pulmonary status is gradually improving, currently he is stable and symptom-free on 1L nasal cannula oxygen.  Encouraged the patient to sit up in chair in the daytime use I-S and flutter valve for pulmonary toiletry and then prone in bed when at night.  Will advance activity  and titrate down oxygen as possible.   Recent Labs  Lab 10/25/19 0352 10/25/19 0352 10/25/19 0353 10/25/19 1600 10/26/19 0402 10/27/19 0500 10/28/19 0453 10/28/19 0454 10/29/19 0409 10/29/19 0410 10/30/19 0320 10/31/19 0411  WBC 15.5*   < >  --   --  14.3* 13.1* 10.7*  --  10.5  --   --  8.3  PLT 506*   < >  --   --  396 339 372  --  363  --   --  338  CRP 3.7*  --   --   --  5.4* 9.2* 8.8*  --  8.4*  --   --   --   BNP  --   --  34.4  --  38.0 54.1  --  46.0  --  76.9  --   --   DDIMER 5.12*  --   --   --  6.29* 5.77* 5.74*  --  5.18*  --   --   --   PROCALCITON  --    < >  --  <0.10 <0.10 0.13 <0.10  --  <0.10  --  <0.10  --   AST 30   < >  --   --  23 28 24   --  21  --   --  18  ALT 25   < >  --   --  16 17 18   --  17  --   --  17  ALKPHOS 105   < >  --   --  89 93 92  --  86  --   --  85  BILITOT 0.4   < >  --   --  1.2 1.0 0.6  --  0.8  --   --  0.7  ALBUMIN 2.5*   < >  --   --  2.3* 2.2* 2.1*  --  2.1*  --   --  2.1*  INR  --   --   --  1.2  --   --   --   --   --   --   --   --    < > = values in this interval not displayed.    2.  Sepsis with MSSA and Group B strep bacteremia due to left foot abscess.  ID following, was on IV Ancef but switch to nafcillin on 10/26/2019.  Clean TEE, history of Charcot joints, peripheral neuropathy and recent left foot soft tissue injury, MRI left foot shows an abscess, Ortho was consulted and they recommended medical management with outpatient follow-up with Dr. Sharol Given post discharge, also has osteomyelitis, defer duration of treatment with antibiotics to ID.  Current ID recommendations on nafcillin via PICC till 11/22/2019 thereafter Keflex with no stop date, follow closely with Dr. Linus Salmons.  3.  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  For seizures he has been started on Keppra with good effect and his mentation has improved considerably. Continue PT OT and speech, SNF VS CIR .  Neuro following.   4. Possible systolic heart failure as shown by TTE however this could have been transient due to sepsis, on repeat TEE his EF is now normalized to 50 to 55%.  He is currently compensated, blood pressure was too low and after hydration has improved, continue beta-blocker.   5.  Cyanotic right lower extremity toes.  Likely due to hypoperfusion which is transient, arterial ultrasound stable.  6.  History of chronic pain.  On multiple narcotics, Flexeril  and benzodiazepines.  All on hold in the hospital.  7. HTN - BP was low on multiple blood pressure medications, was hydrated now able to tolerate low-dose beta-blocker.  8.  Elevated D-dimer, positive acute DVT in the right lower extremity.  Switched to full dose Lovenox.   9.  Hypomagnesemia.  Replaced.    10. DM type II.  Currently on Lantus and sliding scale monitor.  Lab Results  Component Value Date   HGBA1C 11.3 (H) 10/26/2019   CBG (last 3)  Recent Labs    10/31/19 0016 10/31/19 0402 10/31/19 0804  GLUCAP 118* 140* 122*      Condition -   Guarded  Family Communication  :  Sister and mother [phone] - 10/25/19, 10/27/19, 10/28/19  Code Status :  Full  Consults  :  PCCM, ID, Cards  Procedures  :    EEG - This study showed evidence of potential epileptogenicity arising from bilateral frontocentral, maximal vertex region. The sharp waves are at times rhythmic consistent with brief ictal-interictal rhythmic discharges. Additionally, there is evidence of mild diffuse encephalopathy, nonspecific etiology.  Bilateral lower extremity venous ultrasound - Acute right lower extremity popliteal vein DVT.  Carotid duplex.  Nonacute.    MRI - Peripherally  enhancing lesion at the base of the left precentral gyrus, favored to indicate cerebritis in this bacteremic patient.  CT Head -  1. No acute intracranial abnormality. 2. Area of subcortical low-density involving the posterior left frontal lobe at the convexity, likely encephalomalacia and sequela of remote infarct. There are no prior exams for comparison to establish chronicity. MRI could be considered for further evaluation based on clinical concern. 3. Paranasal sinus fluid levels on opacification of the right greater than left mastoid air cells, possibly related to intubation.  R. Leg Arterial US -  No evidence of arterial occlusive disease. Normal waveforms, no stenosis  TTE -  1. Extremely poor acoustic windows. LVEF appears to be depressed with diffuse hypokinesis, worse in the mid/distal inferior/inferoseptal walls. . Left ventricular ejection fraction, by estimation, is 30 to 35%. The left ventricle has moderately decreased function. The left ventricular internal cavity size was mildly dilated. Left ventricular diastolic parameters are indeterminate.  2. Right ventricular systolic function is moderately reduced. The right ventricular size is mildly enlarged.  3. The mitral valve is normal in structure. Trivial mitral valve regurgitation.  4. The aortic valve is normal in structure. Aortic valve regurgitation is not visualized  TEE - 1. Left ventricular ejection fraction, by estimation, is 50 to 55%. The left ventricle has low normal function. The left ventricle has no regional wall motion abnormalities.  2. Right ventricular systolic function is normal. The right ventricular size is normal.  3. No left atrial/left atrial appendage thrombus was detected.  4. The mitral valve is normal in structure. Trivial mitral valve regurgitation. No evidence of mitral stenosis.  5. The aortic valve is normal in structure. Aortic valve regurgitation is not visualized. No aortic stenosis is present.  6. The  inferior vena cava is normal in size with greater than 50% respiratory variability, suggesting right atrial pressure of 3 mmHg.  CT - 1. No acute intracranial abnormality. 2. Area of subcortical low-density involving the posterior left frontal lobe at the convexity, likely encephalomalacia and sequela of remote infarct. There are no prior exams for comparison to establish chronicity. MRI could be considered for further evaluation based on clinical concern. 3. Paranasal sinus fluid levels on opacification of the right greater than  left mastoid air cells, possibly related to intubation    PUD Prophylaxis : PPI  Disposition Plan  :    Status is: Inpatient  Remains inpatient appropriate because:IV treatments appropriate due to intensity of illness or inability to take PO   Dispo: The patient is from: Home              Anticipated d/c is to: Home              Anticipated d/c date is: 3 days              Patient currently is not medically stable to d/c.   DVT Prophylaxis  :  Lovenox    Lab Results  Component Value Date   PLT 338 10/31/2019    Diet :  Diet Order            Diet heart healthy/carb modified Room service appropriate? No; Fluid consistency: Thin  Diet effective now                  Inpatient Medications  Scheduled Meds: . (feeding supplement) PROSource Plus  30 mL Oral BID BM  . alteplase  2 mg Intracatheter Once  . atorvastatin  40 mg Oral Daily  . buPROPion  150 mg Oral Daily  . chlorhexidine gluconate (MEDLINE KIT)  15 mL Mouth Rinse BID  . Chlorhexidine Gluconate Cloth  6 each Topical Daily  . docusate sodium  100 mg Oral BID  . enoxaparin (LOVENOX) injection  120 mg Subcutaneous Q12H  . feeding supplement (ENSURE ENLIVE)  237 mL Oral TID BM  . insulin aspart  0-15 Units Subcutaneous Q4H  . insulin glargine  20 Units Subcutaneous Daily  . Ipratropium-Albuterol  1 puff Inhalation Q6H  . levETIRAcetam  500 mg Oral BID  . linagliptin  5 mg Oral Daily  .  melatonin  3 mg Oral QHS  . metoprolol tartrate  50 mg Oral BID  . pantoprazole  40 mg Oral Daily  . polyethylene glycol  17 g Oral Daily  . senna  2 tablet Oral BID  . sodium chloride flush  10-40 mL Intracatheter Q12H   Continuous Infusions: . sodium chloride 10 mL/hr at 10/30/19 1622  . magnesium sulfate bolus IVPB 4 g (10/31/19 0845)  . nafcillin (NAFCIL) continuous infusion 12 g (10/30/19 1618)   PRN Meds:.sodium chloride, bisacodyl  Antibiotics  :    Anti-infectives (From admission, onward)   Start     Dose/Rate Route Frequency Ordered Stop   10/31/19 1415  nafcillin 12 g in sodium chloride 0.9 % 500 mL continuous infusion  Status:  Discontinued        12 g 20.8 mL/hr over 24 Hours Intravenous Every 24 hours 10/31/19 0608 10/31/19 0609   10/29/19 1415  nafcillin 12 g in dextrose 5 % 500 mL IVPB  Status:  Discontinued        12 g 20.8 mL/hr over 24 Hours Intravenous Every 24 hours 10/29/19 0434 10/29/19 0507   10/29/19 1415  nafcillin 12 g in sodium chloride 0.9 % 500 mL continuous infusion        12 g 20.8 mL/hr over 24 Hours Intravenous Every 24 hours 10/29/19 0507     10/26/19 1415  nafcillin 12 g in dextrose 5 % 500 mL IVPB  Status:  Discontinued        12 g 20.8 mL/hr over 24 Hours Intravenous Every 24 hours 10/26/19 1414 10/29/19 0434   10/26/19 1400  nafcillin injection 12 g  Status:  Discontinued        12 g Intravenous Daily 10/26/19 1238 10/26/19 1242   10/26/19 1400  nafcillin 12 g in dextrose 5 % 50 mL IVPB  Status:  Discontinued        12 g 100 mL/hr over 30 Minutes Intravenous Every 24 hours 10/26/19 1242 10/26/19 1339   10/26/19 1400  nafcillin 12 g in dextrose 5 % 500 mL IVPB  Status:  Discontinued        12 g 20.8 mL/hr over 24 Hours Intravenous Every 24 hours 10/26/19 1337 10/26/19 1416   10/26/19 1200  nafcillin 2 g in sodium chloride 0.9 % 100 mL IVPB  Status:  Discontinued        2 g 200 mL/hr over 30 Minutes Intravenous Every 4 hours 10/26/19 1010  10/26/19 1238   10/12/19 2200  cefTRIAXone (ROCEPHIN) 2 g in sodium chloride 0.9 % 100 mL IVPB  Status:  Discontinued        2 g 200 mL/hr over 30 Minutes Intravenous Every 24 hours 10/12/19 0441 10/12/19 0921   10/12/19 0930  ceFAZolin (ANCEF) IVPB 2g/100 mL premix  Status:  Discontinued        2 g 200 mL/hr over 30 Minutes Intravenous Every 8 hours 10/12/19 0921 10/26/19 1010   10/11/19 1000  remdesivir 100 mg in sodium chloride 0.9 % 100 mL IVPB       "Followed by" Linked Group Details   100 mg 200 mL/hr over 30 Minutes Intravenous Daily 10/10/19 0050 10/14/19 0956   10/11/19 0800  vancomycin (VANCOCIN) IVPB 1000 mg/200 mL premix  Status:  Discontinued        1,000 mg 200 mL/hr over 60 Minutes Intravenous Every 8 hours 10/10/19 2239 10/12/19 0921   10/10/19 2230  vancomycin (VANCOCIN) IVPB 1000 mg/200 mL premix        1,000 mg 200 mL/hr over 60 Minutes Intravenous Every 1 hr x 2 10/10/19 2208 10/11/19 0052   10/10/19 2215  cefTRIAXone (ROCEPHIN) 2 g in sodium chloride 0.9 % 100 mL IVPB  Status:  Discontinued        2 g 200 mL/hr over 30 Minutes Intravenous Every 24 hours 10/10/19 2205 10/12/19 0441   10/10/19 0100  remdesivir 100 mg in sodium chloride 0.9 % 100 mL IVPB       "Followed by" Linked Group Details   100 mg 200 mL/hr over 30 Minutes Intravenous Every 30 min 10/10/19 0050 10/10/19 0332       Time Spent in minutes  30   Lala Lund M.D on 10/31/2019 at 8:50 AM  To page go to [email or web address] - password [email or web address]  [facility] -  Office  [phone]   See all Orders from today for further details    Objective:   Vitals:   10/30/19 2000 10/31/19 0000 10/31/19 0400 10/31/19 0807  BP: 119/90 (!) 102/58 112/73 129/81  Pulse: 99 80 93 85  Resp: 20 18 20 20   Temp: 98.8 F (37.1 C) 98.5 F (36.9 C) 98.7 F (37.1 C) 98.2 F (36.8 C)  TempSrc: Oral Oral Oral Oral  SpO2: 94% 94% 91% 94%  Weight:   114.2 kg   Height:        Wt Readings from Last 3 Encounters:    10/31/19 114.2 kg  09/11/19 111.1 kg  05/12/17 (!) 145.2 kg     Intake/Output Summary (Last 24 hours) at 10/31/2019 0850 Last data filed at 10/31/2019  0642 Gross per 24 hour  Intake 480 ml  Output 1825 ml  Net -1345 ml     Physical Exam  Awake & alert, chronic right-sided weakness, deltoid weakness in both extremities,  [place].AT,PERRAL Supple Neck,No JVD, No cervical lymphadenopathy appriciated.  Symmetrical Chest wall movement, Good air movement bilaterally, CTAB RRR,No Gallops, Rubs or new Murmurs, No Parasternal Heave +ve B.Sounds, Abd Soft, No tenderness, No organomegaly appriciated, No rebound - guarding or rigidity. Mild cyanosis of the right toes, both ankles under bandage,    Data Review:    CBC Recent Labs  Lab 10/26/19 0402 10/27/19 0500 10/28/19 0453 10/29/19 0409 10/31/19 0411  WBC 14.3* 13.1* 10.7* 10.5 8.3  HGB 11.2* 10.4* 10.6* 9.7* 10.1*  HCT 34.3* 31.5* 32.2* 30.8* 30.5*  PLT 396 339 372 363 338  MCV 93.7 92.4 93.6 93.1 91.9  MCH 30.6 30.5 30.8 29.3 30.4  MCHC 32.7 33.0 32.9 31.5 33.1  RDW 12.3 12.3 12.5 12.4 12.5  LYMPHSABS 2.4 2.2 1.9 2.8 2.3  MONOABS 0.8 0.7 0.5 0.6 0.6  EOSABS 0.1 0.1 0.1 0.2 0.3  BASOSABS 0.1 0.0 0.1 0.0 0.0    Recent Labs  Lab  0000 10/25/19 0352 10/25/19 0353 10/25/19 1600 10/25/19 1600 10/26/19 0402 10/26/19 0439 10/27/19 0500 10/28/19 0453 10/28/19 0454 10/29/19 0409 10/29/19 0410 10/30/19 0320 10/31/19 0411  NA   < > 141  --   --   --  134*  --  135 132*  --  134*  --   --  134*  K   < > 3.3*  --   --   --  3.5  --  3.5 3.5  --  3.7  --   --  3.7  CL   < > 102  --   --   --  101  --  102 99  --  100  --   --  98  CO2   < > 27  --   --   --  24  --  25 25  --  26  --   --  27  GLUCOSE   < > 100*  --   --   --  111*  --  134* 205*  --  145*  --   --  139*  BUN   < > 17  --   --   --  12  --  11 8  --  7  --   --  6  CREATININE   < > 0.69  --   --   --  0.75  --  0.73 0.75  --  0.67  --   --  0.59*  CALCIUM    < > 9.1  --   --   --  8.6*  --  8.5* 8.3*  --  8.5*  --   --  8.9  AST   < > 30  --   --   --  23  --  28 24  --  21  --   --  18  ALT   < > 25  --   --   --  16  --  17 18  --  17  --   --  17  ALKPHOS   < > 105  --   --   --  89  --  93 92  --  86  --   --  85  BILITOT   < >  0.4  --   --   --  1.2  --  1.0 0.6  --  0.8  --   --  0.7  ALBUMIN   < > 2.5*  --   --   --  2.3*  --  2.2* 2.1*  --  2.1*  --   --  2.1*  MG   < > 1.7  --   --   --  1.5*  --  1.7 1.4*  --  1.4*  --   --  1.3*  CRP  --  3.7*  --   --   --  5.4*  --  9.2* 8.8*  --  8.4*  --   --   --   DDIMER  --  5.12*  --   --   --  6.29*  --  5.77* 5.74*  --  5.18*  --   --   --   PROCALCITON  --   --   --  <0.10   < > <0.10  --  0.13 <0.10  --  <0.10  --  <0.10  --   INR  --   --   --  1.2  --   --   --   --   --   --   --   --   --   --   TSH  --   --   --   --   --   --  4.137  --   --   --   --   --   --   --   HGBA1C  --   --   --   --   --  11.3*  --   --   --   --   --   --   --   --   BNP  --   --  34.4  --   --  38.0  --  54.1  --  46.0  --  76.9  --   --    < > = values in this interval not displayed.    ------------------------------------------------------------------------------------------------------------------ No results for input(s): CHOL, HDL, LDLCALC, TRIG, CHOLHDL, LDLDIRECT in the last 72 hours.  Lab Results  Component Value Date   HGBA1C 11.3 (H) 10/26/2019   ------------------------------------------------------------------------------------------------------------------ No results for input(s): TSH, T4TOTAL, T3FREE, THYROIDAB in the last 72 hours.  Invalid input(s): FREET3  Cardiac Enzymes No results for input(s): CKMB, TROPONINI, MYOGLOBIN in the last 168 hours.  Invalid input(s): CK ------------------------------------------------------------------------------------------------------------------    Component Value Date/Time   BNP 76.9 10/29/2019 0410    Micro Results Recent Results (from  the past 240 hour(s))  Culture, blood (routine x 2)     Status: None   Collection Time: 10/23/19 10:58 AM   Specimen: BLOOD  Result Value Ref Range Status   Specimen Description BLOOD LEFT ANTECUBITAL  Final   Special Requests   Final    BOTTLES DRAWN AEROBIC ONLY Blood Culture adequate volume   Culture   Final    NO GROWTH 5 DAYS Performed at [facility], [address] [place], [place] [address]    Report Status 10/28/2019 FINAL  Final  Culture, blood (routine x 2)     Status: None   Collection Time: 10/23/19 11:00 AM   Specimen: BLOOD LEFT HAND  Result Value Ref Range Status   Specimen Description BLOOD LEFT HAND  Final   Special Requests   Final  BOTTLES DRAWN AEROBIC ONLY Blood Culture adequate volume   Culture   Final    NO GROWTH 5 DAYS Performed at [facility], [place] [address], [place], [place] [address]    Report Status 10/28/2019 FINAL  Final    Radiology Reports CT HEAD WO CONTRAST  Result Date: 10/19/2019 CLINICAL DATA:  Deliriums.  Mental status change. EXAM: CT HEAD WITHOUT CONTRAST TECHNIQUE: Contiguous axial images were obtained from the base of the skull through the vertex without intravenous contrast. COMPARISON:  None. FINDINGS: Brain: Area subcortical low-density involving the posterior left frontal lobe at the convexity, series 3, image 25 and series 5, image 49, likely encephalomalacia and sequela of remote infarct, however nonspecific. No hemorrhage. No evidence of acute ischemia. No hydrocephalus, midline shift or mass effect. No subdural or extra-axial collection. Vascular: Atherosclerosis of skullbase vasculature without hyperdense vessel or abnormal calcification. Skull: No fracture or focal lesion. Sinuses/Orbits: Nasogastric tube in place. Fluid levels in the sphenoid sinuses and right maxillary sinus with scattered opacification of ethmoid air cells may be related to intubation. Opacification of the right greater than left mastoid air cells.  Other: None. IMPRESSION: 1. No acute intracranial abnormality. 2. Area of subcortical low-density involving the posterior left frontal lobe at the convexity, likely encephalomalacia and sequela of remote infarct. There are no prior exams for comparison to establish chronicity. MRI could be considered for further evaluation based on clinical concern. 3. Paranasal sinus fluid levels on opacification of the right greater than left mastoid air cells, possibly related to intubation. Electronically Signed   By: Keith Rake M.D.   On: 10/19/2019 15:30   CT ANGIO CHEST PE W OR WO CONTRAST  Result Date: 10/23/2019 CLINICAL DATA:  COVID pneumonia, acute hypoxic respiratory failure, positive D-dimer EXAM: CT ANGIOGRAPHY CHEST WITH CONTRAST TECHNIQUE: Multidetector CT imaging of the chest was performed using the standard protocol during bolus administration of intravenous contrast. Multiplanar CT image reconstructions and MIPs were obtained to evaluate the vascular anatomy. CONTRAST:  48m OMNIPAQUE IOHEXOL 350 MG/ML SOLN COMPARISON:  None. FINDINGS: Cardiovascular: Satisfactory opacification of the pulmonary arteries to the segmental level. No evidence of pulmonary embolism. The central pulmonary arteries are of normal caliber. There is moderate coronary artery calcification. Normal heart size. No pericardial effusion. The thoracic aorta is unremarkable save for bovine arch anatomy. Right upper extremity PICC line tip is seen within the superior vena cava. Mediastinum/Nodes: No enlarged mediastinal, hilar, or axillary lymph nodes. Thyroid gland, trachea, and esophagus demonstrate no significant findings. Nasoenteric feeding tube extends into the upper abdomen beyond the margin of the examination. Lungs/Pleura: Lung volumes are small. Evaluation is limited by motion artifact, however, multifocal pulmonary infiltrates are identified demonstrating a peripheral and basal predominance, likely infectious or inflammatory in  the acute setting. No central obstructing lesion. No pneumothorax or pleural effusion. No superimposed interstitial edema. Upper Abdomen: No acute abnormality. Musculoskeletal: No chest wall abnormality. No acute or significant osseous findings. Review of the MIP images confirms the above findings. IMPRESSION: 1. No evidence of pulmonary embolism. 2. Multifocal pulmonary infiltrates are identified demonstrating a peripheral and basal predominance, likely infectious or inflammatory in the acute setting. 3. Moderate coronary artery calcification. Electronically Signed   By: AFidela SalisburyMD   On: 10/23/2019 16:25   MR ANGIO HEAD WO CONTRAST  Result Date: 10/29/2019 CLINICAL DATA:  Neuro deficit with stroke suspected EXAM: MRA HEAD WITHOUT CONTRAST TECHNIQUE: Angiographic images of the Circle of Willis were obtained using MRA technique without  intravenous contrast. COMPARISON:  Four days ago FINDINGS: History of bacteremia and embolic infarction. No vessel beading, stenosis, or pseudoaneurysm. IMPRESSION: Negative intracranial MRA. Electronically Signed   By: Monte Fantasia M.D.   On: 10/29/2019 12:04   MR BRAIN WO CONTRAST  Result Date: 10/25/2019 CLINICAL DATA:  Neuro deficit, acute, stroke suspected. Additional history provided: COVID positive. Additional history obtained from [place] with MSSA and group B strep bacteremia. EXAM: MRI HEAD WITHOUT CONTRAST TECHNIQUE: Multiplanar, multiecho pulse sequences of the brain and surrounding structures were obtained without intravenous contrast. COMPARISON:  Head CT 10/19/2019. FINDINGS: Brain: The examination is limited by poor signal on several sequences as well as motion degradation. Most notably, there is moderate motion degradation of the axial T2 weighted sequence, severe motion degradation of the axial SWI sequence and moderate motion degradation of the coronal T2 weighted sequence. Cerebral volume is normal for age. There is a 17 mm  focus of restricted diffusion and corresponding T2/FLAIR hyperintensity within the subcortical left frontoparietal lobes (for instance as seen on series 2, image 39) (series 3, image 17). Additional mild scattered T2/FLAIR hyperintensity within the cerebral white matter and pons is nonspecific, but consistent with chronic small vessel ischemic disease. Severe motion degradation of the axial SWI sequence precludes adequate evaluation for intracranial blood products. No extra-axial fluid collection. No midline shift. Vascular: Expected proximal arterial flow voids. Skull and upper cervical spine: No focal marrow lesion is identified within described limitations. Sinuses/Orbits: Visualized orbits show no acute finding. Air-fluid levels within the sphenoid and right maxillary sinuses. Partial opacification of right ethmoid air cells. Bilateral mastoid effusions. These results will be called to the ordering clinician or representative by the Radiologist Assistant, and communication documented in the PACS or [employer]. IMPRESSION: Examination limited by poor signal on several sequences as well as motion degradation, as described. 17 mm focus of restricted diffusion and T2/FLAIR hyperintensity within the subcortical left frontoparietal lobes. This finding is nonspecific, but given the patient's history, the primary differential considerations are acute/early subacute infarct versus cerebritis. Consider contrast-enhanced MR imaging of the brain for further evaluation of this lesion. Mild chronic small vessel ischemic disease. Paranasal sinus disease with air-fluid levels. Bilateral mastoid effusions. Electronically Signed   By: Kellie Simmering DO   On: 10/25/2019 15:16   MR BRAIN W CONTRAST  Result Date: 10/25/2019 CLINICAL DATA:  Brain mass follow-up EXAM: MRI HEAD WITH CONTRAST TECHNIQUE: Multiplanar, multiecho pulse sequences of the brain and surrounding structures were obtained with intravenous contrast.  CONTRAST:  68m GADAVIST GADOBUTROL 1 MMOL/ML IV SOLN COMPARISON:  Brain MRI without contrast 10/25/2019 FINDINGS: Brain: There is a peripherally enhancing lesion at the base of the left precentral gyrus that measures 0.9 x 0.7 x 1.8 cm. There are no other areas of abnormal contrast enhancement. 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 Electronically Signed   By: KUlyses JarredM.D.   On: 10/25/2019 22:47   MR FOOT LEFT WO CONTRAST  Result Date: 10/25/2019 CLINICAL DATA:  Bacteremia left foot pain and diabetic EXAM: MRI OF THE LEFT FOOT WITHOUT CONTRAST TECHNIQUE: Multiplanar, multisequence MR imaging of the left was performed. No intravenous contrast was administered. COMPARISON:  October 23, 2019 FINDINGS: Bones/Joint/Cartilage Mildly angulated nondisplaced fractures of the midshaft of the fourth and fifth metatarsals are noted. There is periosteal reaction seen along the medial margins of the metatarsal shafts. Increased heterogeneous T2 hyperintense signal with  subtle T1 hypointensity seen at the base of the fourth and fifth metatarsals. No other osseous marrow signal abnormality is seen. There is no large knee joint effusions noted. The articular surfaces appear to be maintained. Ligaments The Lisfranc ligament is intact. Muscles and Tendons Increased feathery signal with atrophy is seen throughout the muscles of the forefoot. There is a small amount of fluid seen surrounding the posterior tibialis tendon. The remainder of the flexor and extensor tendons are intact. Soft tissues Lateral plantar surface of the forefoot overlying the fifth metatarsal base there is a focal area of ulceration measuring approximately 8 mm in transverse dimension. A fluid-filled sinus tract is seen extending to the overlying osseous surface. There is a multilocular fluid collection which extends around the dorsal surface of the  fifth metatarsal measuring approximately 5.4 x 0.9 x 3.4 cm. There is extensive dorsal and lateral subcutaneous edema and skin thickening. IMPRESSION: Superficial area of ulceration over the plantar lateral aspect of the fifth metatarsal base with a fluid-filled sinus tract and loculated probable abscess extending over the dorsal surface of the fifth metatarsal measuring 5.4 x 0.9 by is a 3.4 cm. Incomplete mildly angulated fourth and fifth metatarsal shaft fractures with periosteal reaction Findings which could be suggestive of reactive marrow versus early osteomyelitis involving the base of the fourth and fifth metatarsals. Electronically Signed   By: Prudencio Pair M.D.   On: 10/25/2019 19:06   DG Chest Port 1 View  Result Date: 10/26/2019 CLINICAL DATA:  Short of breath.  COVID positive EXAM: PORTABLE CHEST 1 VIEW COMPARISON:  10/25/2019 FINDINGS: Hypoventilation with decreased lung volume. Mild bibasilar airspace disease unchanged. No new infiltrate or effusion. Right arm PICC tip in the SVC unchanged. IMPRESSION: Hypoventilation with bibasilar mild airspace disease unchanged. Electronically Signed   By: Franchot Gallo M.D.   On: 10/26/2019 16:10   DG Chest Port 1 View  Result Date: 10/25/2019 CLINICAL DATA:  Shortness of breath. EXAM: PORTABLE CHEST 1 VIEW COMPARISON:  October 16, 2019. FINDINGS: The heart size and mediastinal contours are within normal limits. Hypoinflation of the lungs is noted with mild bibasilar subsegmental atelectasis. Endotracheal tube has been removed. Right-sided PICC line is unchanged in position. The visualized skeletal structures are unremarkable. IMPRESSION: Hypoinflation of the lungs with mild bibasilar subsegmental atelectasis. Electronically Signed   By: Marijo Conception M.D.   On: 10/25/2019 08:37   DG CHEST PORT 1 VIEW  Result Date: 10/16/2019 CLINICAL DATA:  Status post intubation. EXAM: PORTABLE CHEST 1 VIEW COMPARISON:  10/14/2019 FINDINGS: ET tube tip is above the  carina. There is a right arm PICC line with tip at the cavoatrial junction. Lung volumes are low. Similar appearance of bilateral pulmonary opacities compatible with multifocal pneumonia. IMPRESSION: 1. Stable support apparatus. 2. Persistent bilateral pulmonary opacities compatible with multifocal pneumonia. Electronically Signed   By: Kerby Moors M.D.   On: 10/16/2019 06:14   DG Chest Port 1 View  Result Date: 10/15/2019 CLINICAL DATA:  49 year old male with history of COVID infection. EXAM: PORTABLE CHEST 1 VIEW COMPARISON:  Chest x-ray 10/14/2019. FINDINGS: An endotracheal tube is in place with tip 3.6 cm above the carina. A nasogastric tube is seen extending into the stomach, however, the tip of the nasogastric tube extends below the lower margin of the image. There is a right upper extremity PICC with tip terminating in the right atrium. Lung volumes are low. Widespread patchy areas of interstitial prominence an ill-defined airspace disease again noted throughout the  lungs bilaterally. Overall, aeration is essentially unchanged. No pleural effusions. No evidence of pulmonary edema. No pneumothorax. Heart size is normal. Upper mediastinal contours are within normal limits. IMPRESSION: 1. Support apparatus, as above. 2. The appearance the chest is compatible with multilobar pneumonia from reported COVID infection. Electronically Signed   By: Vinnie Langton M.D.   On: 10/15/2019 05:39   DG Chest Port 1 View  Result Date: 10/14/2019 CLINICAL DATA:  COVID-19, ARDS EXAM: PORTABLE CHEST 1 VIEW COMPARISON:  10/13/2019 FINDINGS: Single frontal view of the chest demonstrates stable position of the endotracheal tube and enteric catheter. There is a right-sided PICC tip overlying superior vena cava. The cardiac silhouette is stable. Interstitial and ground-glass opacities are seen throughout the lungs bilaterally, unchanged. Lung volumes are diminished. No effusion or pneumothorax. IMPRESSION: 1. 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 2. Stable support devices. Electronically Signed   By: Randa Ngo M.D.   On: 10/14/2019 15:57   DG Chest Port 1 View  Result Date: 10/13/2019 CLINICAL DATA:  Acute respiratory failure with hypoxemia, COVID positive, asthma, diabetes mellitus, hypertension, CHF EXAM: PORTABLE CHEST 1 VIEW COMPARISON:  Portable exam 1021 hours compared to 10/12/2019 FINDINGS: Tip of endotracheal tube projects 3.7 cm above carina. Nasogastric tube extends into stomach. RIGHT arm PICC line tip projects over cavoatrial junction. Stable heart size mediastinal contours for technique. Patchy BILATERAL airspace infiltrates consistent with multifocal pneumonia, minimally improved. No pleural effusion or pneumothorax. IMPRESSION: Minimally improved pulmonary infiltrates. Electronically Signed   By: Lavonia Dana M.D.   On: 10/13/2019 10:37   DG CHEST PORT 1 VIEW  Result Date: 10/12/2019 CLINICAL DATA:  Intubation EXAM: PORTABLE CHEST 1 VIEW COMPARISON:  Earlier today FINDINGS: New endotracheal tube with tip at the clavicular heads, measuring 3 cm above the carina. Low volume chest with severe airspace disease. No visible effusion or air leak. Stable heart size which is distorted by positioning and volumes. IMPRESSION: 1. Unremarkable positioning of the endotracheal tube. 2. Stable extensive airspace disease with low lung volumes. Electronically Signed   By: Monte Fantasia M.D.   On: 10/12/2019 07:13   DG CHEST PORT 1 VIEW  Result Date: 10/12/2019 CLINICAL DATA:  Shortness of breath EXAM: PORTABLE CHEST 1 VIEW COMPARISON:  Three days ago FINDINGS: Very low volume chest with diffuse and patchy pulmonary opacity. Vascular pedicle widening and prominent heart size largely related to technique. No visible effusion or pneumothorax. IMPRESSION: Worsening multifocal pneumonia.  Lung volumes remain very low.  Electronically Signed   By: Monte Fantasia M.D.   On: 10/12/2019 04:07   DG Chest Portable 1 View  Result Date: 10/09/2019 CLINICAL DATA:  Cough, fever, shortness of breath EXAM: PORTABLE CHEST 1 VIEW COMPARISON:  None. FINDINGS: Single frontal view of the chest demonstrates unremarkable cardiac silhouette. The lung volumes are diminished, with patchy bilateral areas of faint ground-glass airspace disease consistent with multifocal pneumonia. No effusion or pneumothorax. IMPRESSION: 1. Patchy bilateral ground-glass airspace disease consistent with multifocal pneumonia. Electronically Signed   By: Randa Ngo M.D.   On: 10/09/2019 19:32   DG Abd Portable 1V  Result Date: 10/20/2019 CLINICAL DATA:  GI problem. EXAM: PORTABLE ABDOMEN - 1 VIEW COMPARISON:  10/16/2019 FINDINGS: Feeding tube tip is in the distal stomach or proximal duodenum. Mild gaseous distention of the stomach. Gas throughout nondistended large and small bowel. No evidence of bowel obstruction, organomegaly or free air. IMPRESSION: Feeding tube  tip in the distal stomach or proximal duodenum. No evidence of obstruction or free air. Electronically Signed   By: Rolm Baptise M.D.   On: 10/20/2019 21:28   DG Abd Portable 1V  Result Date: 10/16/2019 CLINICAL DATA:  Status post OG tube placement EXAM: PORTABLE ABDOMEN - 1 VIEW COMPARISON:  10/16/2019 FINDINGS: The enteric tube tip projects over the distal body of stomach in the side port is below the level of the GE junction. A few air-filled loops of small bowel are noted within the upper abdomen which measure up to 2.8 cm. IMPRESSION: Satisfactory position of enteric tube. Electronically Signed   By: Kerby Moors M.D.   On: 10/16/2019 06:52   DG Abd Portable 1V  Result Date: 10/12/2019 CLINICAL DATA:  Onychogryphosis EXAM: PORTABLE ABDOMEN - 1 VIEW COMPARISON:  Portable exam 0830 hours without priors for comparison FINDINGS: Tip of nasogastric tube projects over distal antrum.  Nonobstructive bowel gas pattern. No bowel dilatation or bowel wall thickening. Patchy bibasilar infiltrates. No acute osseous findings. IMPRESSION: Tip of nasogastric tube projects over distal gastric antrum. Nonobstructive bowel gas pattern. Bibasilar pulmonary infiltrates. Electronically Signed   By: Lavonia Dana M.D.   On: 10/12/2019 08:51   DG Foot 2 Views Left  Result Date: 10/23/2019 CLINICAL DATA:  Bacteremia, LEFT foot pain and diabetic. EXAM: LEFT FOOT - 2 VIEW COMPARISON:  10/13/2019 and prior radiographs FINDINGS: Mildly angulated fractures of the 4th and 5th metatarsals are again noted with fracture lines still evident. Healing changes along these fractures are noted. Overlying soft tissue swelling is noted. No new fracture, subluxation or dislocation noted. No definite radiographic evidence of acute osteomyelitis noted. IMPRESSION: Mildly angulated healing fractures of the 4th and 5th metatarsals with soft tissue swelling again noted but without definite radiographic evidence of acute osteomyelitis. Consider MRI for further evaluation as clinically indicated. Electronically Signed   By: Margarette Canada M.D.   On: 10/23/2019 13:40   DG Foot 2 Views Left  Result Date: 10/13/2019 CLINICAL DATA:  49 year old male with diabetic foot. EXAM: LEFT FOOT - 2 VIEW COMPARISON:  Left foot radiograph dated 09/11/2019. FINDINGS: Evaluation is limited due to positioning. Fractures of the midportion of the fourth and fifth metacarpal with interval progression of bridging callus formation. No new fracture identified. There is no dislocation. There is diffuse soft tissue swelling of the foot. No radiopaque foreign object or soft tissue gas. IMPRESSION: 1. Healing fractures of the fourth and fifth metacarpal. 2. Diffuse soft tissue swelling. No radiopaque foreign object or soft tissue gas. Electronically Signed   By: Anner Crete M.D.   On: 10/13/2019 16:49   EEG adult  Result Date: 10/27/2019 Lora Havens,  MD     10/28/2019 10:13 AM Patient Name: CAIN FITZHENRY MRN: [id number] Epilepsy Attending: Lora Havens Referring Physician/Provider: Dr. Donnetta Simpers Date: 10/27/2019 Duration: 22.22 minutes Patient history: 49 year old male with altered mental status. EEG evaluate for seizures. Level of alertness: Awake, asleep AEDs during EEG study: Keppra Technical aspects: This EEG study was done with scalp electrodes positioned according to the 10-20 International system of electrode placement. Electrical activity was acquired at a sampling rate of 500Hz  and reviewed with a high frequency filter of 70Hz  and a low frequency filter of 1Hz . EEG data were recorded continuously and digitally stored. Description: The posterior dominant rhythm consists of 8 Hz activity of moderate voltage (25-35 uV) seen predominantly in posterior head regions, symmetric and reactive to eye opening and eye closing. Sleep  was characterized by vertex waves, sleep spindles (12 to 14 Hz), maximal frontocentral region. Frequent runs of sharp waves were seen in frontocentral region, maximal vertex lasting about 5 to 6 seconds consistent with brief ictal-interictal rhythmic discharges. Continuous generalized 5 to 6 Hz theta slowing was also noted. Hyperventilation and photic stimulation were not performed.   ABNORMALITY -Continuous slow, generalized -Brief ictal-interictal rhythmic discharges, bilateral frontocentral region, maximal vertex IMPRESSION: This study showed evidence of potential epileptogenicity arising from bilateral frontocentral, maximal vertex region. The sharp waves are at times rhythmic consistent with brief ictal-interictal rhythmic discharges. Additionally, there is evidence of mild diffuse encephalopathy, nonspecific etiology. Priyanka Barbra Sarks   Overnight EEG with video  Result Date: 10/28/2019 Lora Havens, MD     10/29/2019  8:27 AM Patient Name: KATELYN KOHLMEYER MRN: [id number] Epilepsy Attending: Lora Havens Referring  Physician/Provider: Dr. Donnetta Simpers Duration: 10/27/2019 1145 to 10/28/2019 1145  Patient history: 49 year old male with altered mental status. EEG evaluate for seizures.  Level of alertness: Awake, asleep AEDs during EEG study: Keppra  Technical aspects: This EEG study was done with scalp electrodes positioned according to the 10-20 International system of electrode placement. Electrical activity was acquired at a sampling rate of 500Hz  and reviewed with a high frequency filter of 70Hz  and a low frequency filter of 1Hz . EEG data were recorded continuously and digitally stored.  Description: The posterior dominant rhythm consists of 8 Hz activity of moderate voltage (25-35 uV) seen predominantly in posterior head regions, symmetric and reactive to eye opening and eye closing. Sleep was characterized by vertex waves, sleep spindles (12 to 14 Hz), maximal frontocentral region. Initially, frequent runs of sharp waves were seen in frontocentral region, maximal vertex lasting about 5 to 6 seconds consistent with brief ictal-interictal rhythmic discharges. Continuous generalized 5 to 6 Hz theta slowing as well as intermittent generalized rhythmic 2-3hz  delta slowing  was also noted. Hyperventilation and photic stimulation were not performed.    ABNORMALITY -Continuous slow, generalized - Intermittent rhythmic delta slowing, generalized -Brief ictal-interictal rhythmic discharges, bilateral frontocentral region, maximal vertex  IMPRESSION: This study initially showed evidence of potential epileptogenicity arising from bilateral frontocentral, maximal vertex region. The sharp waves are at times rhythmic consistent with brief ictal-interictal rhythmic discharges. Additionally, there is evidence of mild diffuse encephalopathy, nonspecific etiology. No seizures were seen during thi study. EEG appears to be improving compared to previous day.  Lora Havens   ECHOCARDIOGRAM COMPLETE  Result Date: 10/12/2019     ECHOCARDIOGRAM REPORT   Patient Name:   GERALD KUEHL Date of Exam: 10/12/2019 Medical Rec #:  [id number]      Height:       74.0 in Accession #:    [id number]     Weight:       245.0 lb Date of Birth:  April 14, 1970      BSA:          2.369 m Patient Age:    5 years       BP:           111/74 mmHg Patient Gender: M              HR:           75 bpm. Exam Location:  Inpatient Procedure: 2D Echo Indications:    CHF 428                  & Bacteremia 790.7  History:  Patient has no prior history of Echocardiogram examinations.                 CHF; Risk Factors:Hypertension and Diabetes. Covid +.  Sonographer:    Jannett Celestine RDCS (AE) Referring Phys: [id number] Silvestre Moment [facility]  Sonographer Comments: Echo performed with patient supine and on artificial respirator. Image acquisition challenging due to respiratory motion and Image acquisition challenging due to patient body habitus. restricted mobility IMPRESSIONS  1. Extremely poor acoustic windows. LVEF appears to be depressed with diffuse hypokinesis, worse in the mid/distal inferior/inferoseptal walls. . Left ventricular ejection fraction, by estimation, is 30 to 35%. The left ventricle has moderately decreased function. The left ventricular internal cavity size was mildly dilated. Left ventricular diastolic parameters are indeterminate.  2. Right ventricular systolic function is moderately reduced. The right ventricular size is mildly enlarged.  3. The mitral valve is normal in structure. Trivial mitral valve regurgitation.  4. The aortic valve is normal in structure. Aortic valve regurgitation is not visualized. FINDINGS  Left Ventricle: Extremely poor acoustic windows. LVEF appears to be depressed with diffuse hypokinesis, worse in the mid/distal inferior/inferoseptal walls. Left ventricular ejection fraction, by estimation, is 30 to 35%. The left ventricle has moderately decreased function. The left ventricle demonstrates regional wall motion abnormalities. The left  ventricular internal cavity size was mildly dilated. There is no left ventricular hypertrophy. Left ventricular diastolic parameters are indeterminate. Right Ventricle: The right ventricular size is mildly enlarged. Right vetricular wall thickness was not assessed. Right ventricular systolic function is moderately reduced. Left Atrium: Left atrial size was normal in size. Right Atrium: Right atrial size was normal in size. Pericardium: There is no evidence of pericardial effusion. Mitral Valve: The mitral valve is normal in structure. Trivial mitral valve regurgitation. Tricuspid Valve: The tricuspid valve is normal in structure. Tricuspid valve regurgitation is trivial. Aortic Valve: The aortic valve is normal in structure. Aortic valve regurgitation is not visualized. Pulmonic Valve: The pulmonic valve was grossly normal. Pulmonic valve regurgitation is trivial. Aorta: The aortic root is normal in size and structure. IAS/Shunts: The interatrial septum was not assessed.  LEFT VENTRICLE PLAX 2D LVIDd:         5.60 cm  Diastology LVIDs:         3.90 cm  LV e' lateral:   8.81 cm/s LV PW:         1.40 cm  LV E/e' lateral: 5.7 LV IVS:        1.10 cm  LV e' medial:    5.87 cm/s LVOT diam:     2.40 cm  LV E/e' medial:  8.5 LV SV:         64 LV SV Index:   27 LVOT Area:     4.52 cm  RIGHT VENTRICLE RV S prime:     9.79 cm/s TAPSE (M-mode): 1.3 cm LEFT ATRIUM             Index       RIGHT ATRIUM           Index LA diam:        3.10 cm 1.31 cm/m  RA Area:     18.80 cm LA Vol (A2C):   62.9 ml 26.55 ml/m RA Volume:   51.80 ml  21.86 ml/m LA Vol (A4C):   40.8 ml 17.22 ml/m LA Biplane Vol: 50.8 ml 21.44 ml/m  AORTIC VALVE LVOT Vmax:   69.80 cm/s LVOT Vmean:  49.400 cm/s LVOT VTI:  0.142 m  AORTA Ao Root diam: 3.60 cm MITRAL VALVE MV Area (PHT): 2.34 cm    SHUNTS MV Decel Time: 324 msec    Systemic VTI:  0.14 m MV E velocity: 50.10 cm/s  Systemic Diam: 2.40 cm MV A velocity: 71.60 cm/s MV E/A ratio:  0.70 Dorris Carnes MD  Electronically signed by Dorris Carnes MD Signature Date/Time: 10/12/2019/1:58:41 PM    Final    ECHO TEE  Result Date: 10/24/2019    TRANSESOPHOGEAL ECHO REPORT   Patient Name:   Arie Sabina Date of Exam: 10/24/2019 Medical Rec #:  [id number]      Height:       74.0 in Accession #:    [id number]     Weight:       247.4 lb Date of Birth:  January 01, 1971      BSA:          2.379 m Patient Age:    59 years       BP:           120/80 mmHg Patient Gender: M              HR:           100 bpm. Exam Location:  Inpatient Procedure: Transesophageal Echo Indications:    Emobli/Bacteremia  History:        Patient has prior history of Echocardiogram examinations.  Sonographer:    Clayton Lefort RDCS (AE) Referring Phys: [id number] HAO MENG PROCEDURE: The transesophogeal probe was passed without difficulty through the esophogus of the patient. Sedation performed by different physician. The patient's vital signs; including heart rate, blood pressure, and oxygen saturation; remained stable throughout the procedure. The patient developed no complications during the procedure. IMPRESSIONS  1. Left ventricular ejection fraction, by estimation, is 50 to 55%. The left ventricle has low normal function. The left ventricle has no regional wall motion abnormalities.  2. Right ventricular systolic function is normal. The right ventricular size is normal.  3. No left atrial/left atrial appendage thrombus was detected.  4. The mitral valve is normal in structure. Trivial mitral valve regurgitation. No evidence of mitral stenosis.  5. The aortic valve is normal in structure. Aortic valve regurgitation is not visualized. No aortic stenosis is present.  6. The inferior vena cava is normal in size with greater than 50% respiratory variability, suggesting right atrial pressure of 3 mmHg. Conclusion(s)/Recommendation(s): Normal biventricular function without evidence of hemodynamically significant valvular heart disease. FINDINGS  Left Ventricle: Left  ventricular ejection fraction, by estimation, is 50 to 55%. The left ventricle has low normal function. The left ventricle has no regional wall motion abnormalities. The left ventricular internal cavity size was normal in size. There is no left ventricular hypertrophy. Right Ventricle: The right ventricular size is normal. No increase in right ventricular wall thickness. Right ventricular systolic function is normal. Left Atrium: Left atrial size was normal in size. No left atrial/left atrial appendage thrombus was detected. Right Atrium: Right atrial size was normal in size. Pericardium: There is no evidence of pericardial effusion. Mitral Valve: The mitral valve is normal in structure. Normal mobility of the mitral valve leaflets. Trivial mitral valve regurgitation. No evidence of mitral valve stenosis. There is no evidence of mitral valve vegetation. Tricuspid Valve: The tricuspid valve is normal in structure. Tricuspid valve regurgitation is mild . No evidence of tricuspid stenosis. There is no evidence of tricuspid valve vegetation. Aortic Valve: The aortic valve is normal in structure. Aortic  valve regurgitation is not visualized. No aortic stenosis is present. There is no evidence of aortic valve vegetation. Pulmonic Valve: The pulmonic valve was normal in structure. Pulmonic valve regurgitation is trivial. No evidence of pulmonic stenosis. Aorta: The aortic root is normal in size and structure. Venous: The inferior vena cava is normal in size with greater than 50% respiratory variability, suggesting right atrial pressure of 3 mmHg. IAS/Shunts: No atrial level shunt detected by color flow Doppler. Jenkins Rouge MD Electronically signed by Jenkins Rouge MD Signature Date/Time: 10/24/2019/3:28:45 PM    Final    VAS US CAROTID (at [facility] and [facility] only)  Result Date: 10/26/2019 Carotid Arterial Duplex Study Indications:       5m focus of diffusion restriction in the left                    frontoparietal lobe  concerning for acute/early subacute                    infarct vs cerebritis. Risk Factors:      Hypertension, Diabetes. Other Factors:     Covid-19, bacteremia. Comparison Study:  No prior study on file Performing Technologist: CSharion DoveRVS  Examination Guidelines: A complete evaluation includes B-mode imaging, spectral Doppler, color Doppler, and power Doppler as needed of all accessible portions of each vessel. Bilateral testing is considered an integral part of a complete examination. Limited examinations for reoccurring indications may be performed as noted.  Right Carotid Findings: +----------+--------+--------+--------+------------------+--------+           PSV cm/sEDV cm/sStenosisPlaque DescriptionComments +----------+--------+--------+--------+------------------+--------+ CCA Prox  66      11                                         +----------+--------+--------+--------+------------------+--------+ CCA Distal71      18                                         +----------+--------+--------+--------+------------------+--------+ ICA Prox  58      19                                         +----------+--------+--------+--------+------------------+--------+ ICA Distal90      33                                         +----------+--------+--------+--------+------------------+--------+ ECA       114     16                                         +----------+--------+--------+--------+------------------+--------+ +----------+--------+-------+--------+-------------------+           PSV cm/sEDV cmsDescribeArm Pressure (mmHG) +----------+--------+-------+--------+-------------------+ [id number]                                       +----------+--------+-------+--------+-------------------+ +---------+--------+--+--------+--+ VertebralPSV cm/s38EDV cm/s11 +---------+--------+--+--------+--+  Left Carotid Findings:  +----------+--------+--------+--------+------------------+--------+  PSV cm/sEDV cm/sStenosisPlaque DescriptionComments +----------+--------+--------+--------+------------------+--------+ CCA Prox  55      14                                         +----------+--------+--------+--------+------------------+--------+ CCA Distal65      20                                         +----------+--------+--------+--------+------------------+--------+ ICA Prox  68      20                                         +----------+--------+--------+--------+------------------+--------+ ICA Distal76      23                                         +----------+--------+--------+--------+------------------+--------+ ECA       77      3                                          +----------+--------+--------+--------+------------------+--------+ +----------+--------+--------+--------+-------------------+           PSV cm/sEDV cm/sDescribeArm Pressure (mmHG) +----------+--------+--------+--------+-------------------+ [id number]                                          +----------+--------+--------+--------+-------------------+ +---------+--------+--+--------+--+ VertebralPSV cm/s63EDV cm/s18 +---------+--------+--+--------+--+   Summary: Right Carotid: The extracranial vessels were near-normal with only minimal wall                thickening or plaque. Left Carotid: The extracranial vessels were near-normal with only minimal wall               thickening or plaque. Vertebrals:  Bilateral vertebral arteries demonstrate antegrade flow. Subclavians: Normal flow hemodynamics were seen in bilateral subclavian              arteries. *See table(s) above for measurements and observations.  Electronically signed by Antony Contras MD on 10/26/2019 at 12:37:48 PM.    Final    VAS [place] LOWER EXTREMITY ARTERIAL DUPLEX  Result Date: 10/26/2019 LOWER EXTREMITY ARTERIAL DUPLEX STUDY  Indications: Ulceration, and Osteomyelitis. High Risk Factors: Hypertension, Diabetes. Other Factors: Covid-19. Charcot foot.  Current ABI: N/A Comparison Study: No prior study Performing Technologist: Sharion Dove RVS  Examination Guidelines: A complete evaluation includes B-mode imaging, spectral Doppler, color Doppler, and power Doppler as needed of all accessible portions of each vessel. Bilateral testing is considered an integral part of a complete examination. Limited examinations for reoccurring indications may be performed as noted.  +----------+--------+-----+--------+---------+--------+ LEFT      PSV cm/sRatioStenosisWaveform Comments +----------+--------+-----+--------+---------+--------+ CFA Prox  123                  triphasic         +----------+--------+-----+--------+---------+--------+ DFA       51  triphasic         +----------+--------+-----+--------+---------+--------+ SFA Prox  97                   triphasic         +----------+--------+-----+--------+---------+--------+ SFA Mid   88                   triphasic         +----------+--------+-----+--------+---------+--------+ SFA Distal81                   triphasic         +----------+--------+-----+--------+---------+--------+ POP Prox  60                   triphasic         +----------+--------+-----+--------+---------+--------+ POP Distal86                   triphasic         +----------+--------+-----+--------+---------+--------+ ATA Distal110                  triphasic         +----------+--------+-----+--------+---------+--------+ PTA Prox  34                   triphasic         +----------+--------+-----+--------+---------+--------+ PTA Mid   29                   triphasic         +----------+--------+-----+--------+---------+--------+ PTA [id number]                  triphasic         +----------+--------+-----+--------+---------+--------+   Summary: Left: Normal waveforms and adequate flow noted.  See table(s) above for measurements and observations. Electronically signed by Servando Snare MD on 10/26/2019 at 4:50:14 PM.    Final    VAS [place] LOWER EXTREMITY ARTERIAL DUPLEX  Result Date: 10/19/2019 LOWER EXTREMITY ARTERIAL DUPLEX STUDY Indications: Cold leg, Covid-19.  Current ABI: N/A Comparison Study: No prior study Performing Technologist: Sharion Dove RVS  Examination Guidelines: A complete evaluation includes B-mode imaging, spectral Doppler, color Doppler, and power Doppler as needed of all accessible portions of each vessel. Bilateral testing is considered an integral part of a complete examination. Limited examinations for reoccurring indications may be performed as noted.  +-----------+--------+-----+--------+---------+--------+ RIGHT      PSV cm/sRatioStenosisWaveform Comments +-----------+--------+-----+--------+---------+--------+ CFA Prox   67                   triphasic         +-----------+--------+-----+--------+---------+--------+ DFA        60                   triphasic         +-----------+--------+-----+--------+---------+--------+ SFA Prox   68                   triphasic         +-----------+--------+-----+--------+---------+--------+ SFA Mid    62                   triphasic         +-----------+--------+-----+--------+---------+--------+ SFA Distal 69                   triphasic         +-----------+--------+-----+--------+---------+--------+ POP Prox   47  triphasic         +-----------+--------+-----+--------+---------+--------+ POP Distal 42                   triphasic         +-----------+--------+-----+--------+---------+--------+ ATA Distal 15                   triphasic         +-----------+--------+-----+--------+---------+--------+ PTA Prox   31                   triphasic          +-----------+--------+-----+--------+---------+--------+ PTA Mid    38                   triphasic         +-----------+--------+-----+--------+---------+--------+ PTA Distal 38                   triphasic         +-----------+--------+-----+--------+---------+--------+ PERO Distal36                   triphasic         +-----------+--------+-----+--------+---------+--------+  Summary: Right: Normal examination. No evidence of arterial occlusive disease. Normal waveforms, no stenosis.  See table(s) above for measurements and observations. Electronically signed by Monica Martinez MD on 10/19/2019 at 3:25:02 PM.    Final    VAS [place] LOWER EXTREMITY VENOUS (DVT)  Result Date: 10/26/2019  Lower Venous DVTStudy Indications: Covid-19, elevated D-Dimer.  Comparison Study: Prior negative left lower extremity venous duplex don 03/19/17                   and is available for comparison. Performing Technologist: Sharion Dove RVS  Examination Guidelines: A complete evaluation includes B-mode imaging, spectral Doppler, color Doppler, and power Doppler as needed of all accessible portions of each vessel. Bilateral testing is considered an integral part of a complete examination. Limited examinations for reoccurring indications may be performed as noted. The reflux portion of the exam is performed with the patient in reverse Trendelenburg.  +---------+---------------+---------+-----------+----------+--------------+ RIGHT    CompressibilityPhasicitySpontaneityPropertiesThrombus Aging +---------+---------------+---------+-----------+----------+--------------+ CFV      Full           Yes      Yes                                 +---------+---------------+---------+-----------+----------+--------------+ SFJ      Full                                                        +---------+---------------+---------+-----------+----------+--------------+ FV Prox  Full                                                         +---------+---------------+---------+-----------+----------+--------------+ FV Mid   Full                                                        +---------+---------------+---------+-----------+----------+--------------+  FV DistalFull                                                        +---------+---------------+---------+-----------+----------+--------------+ PFV      Full                                                        +---------+---------------+---------+-----------+----------+--------------+ POP      Partial        Yes      Yes                  Acute          +---------+---------------+---------+-----------+----------+--------------+ PTV      Full                                                        +---------+---------------+---------+-----------+----------+--------------+ PERO     Full                                                        +---------+---------------+---------+-----------+----------+--------------+   +---------+---------------+---------+-----------+----------+--------------+ LEFT     CompressibilityPhasicitySpontaneityPropertiesThrombus Aging +---------+---------------+---------+-----------+----------+--------------+ CFV      Full           Yes      Yes                                 +---------+---------------+---------+-----------+----------+--------------+ SFJ      Full                                                        +---------+---------------+---------+-----------+----------+--------------+ FV Prox  Full                                                        +---------+---------------+---------+-----------+----------+--------------+ FV Mid   Full                                                        +---------+---------------+---------+-----------+----------+--------------+ FV DistalFull                                                         +---------+---------------+---------+-----------+----------+--------------+  PFV      Full                                                        +---------+---------------+---------+-----------+----------+--------------+ POP      Full           Yes      Yes                                 +---------+---------------+---------+-----------+----------+--------------+ PTV      Full                                                        +---------+---------------+---------+-----------+----------+--------------+ PERO     Full                                                        +---------+---------------+---------+-----------+----------+--------------+     Summary: RIGHT: - Findings consistent with acute deep vein thrombosis involving the right popliteal vein. - Ultrasound characteristics of enlarged lymph nodes are noted in the groin.  LEFT: - There is no evidence of deep vein thrombosis in the lower extremity.  *See table(s) above for measurements and observations. Electronically signed by Servando Snare MD on 10/26/2019 at 4:50:27 PM.    Final    [place] EKG SITE RITE  Result Date: 10/12/2019 If Site Rite image not attached, placement could not be confirmed due to current cardiac rhythm.

## 2019-10-31 NOTE — Progress Notes (Signed)
Occupational Therapy Treatment Patient Details Name: Micheal Bell MRN: [id number] DOB: 10/23/1970 Today's Date: 10/31/2019    History of present illness Pt is a 49 y.o. male admitted 10/09/19 for COVID PNA. Pt decompensated 8/19 and intubated; self extubated 8/23 and reintubated; extubated 8/28. Course complicated by MSSA and GBS bacteria, sepsis, systolic HF, acute metabolic encephalopathy. 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 TEE without evidence of vegitation. 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   OT comments  Pt progressing with OT goals gradually and cooperative with therapy session. Pt demonstrated ability to complete sit to stand transfer with RW at Mod A x 2 but limited by knee buckling. Guided pt in grooming tasks in [place] at sink with minor cues needed for problem solving. HR up to 142bpm during session but normalized with seated rest break. Pt continues to be limited by decreased strength, balance, endurance, and cognition. Continue to recommend CIR for intensive therapies.    Follow Up Recommendations  CIR;Supervision/Assistance - 24 hour    Equipment Recommendations  3 in 1 bedside commode;Hospital bed;Wheelchair (measurements OT);Wheelchair cushion (measurements OT)    Recommendations for Other Services Rehab consult;Speech consult    Precautions / Restrictions Precautions Precautions: Fall;Other (comment) Precaution Comments: wounds on feet; bowel incontinence Restrictions Weight Bearing Restrictions: No       Mobility Bed Mobility Overal bed mobility: Needs Assistance Bed Mobility: Supine to Sit     Supine to sit: Max assist;HOB elevated     General bed mobility comments: Pt required  assist to advance R LE off of bed and bring trunk up to sitting EOB  Transfers Overall transfer level: Needs assistance Equipment used: Rolling walker (2 wheeled);Ambulation equipment used Transfers: Sit to/from Stand Sit to Stand: Mod assist;+2 physical assistance;From elevated surface         General transfer comment: Mod A x 2 for two sit to stand trials with RW and elevated bed, cues for safe hand placement. Pt Min A x 2 for sit to stand in [place], min guard to maintain safe placement in [name]    Balance Overall balance assessment: Needs assistance Sitting-balance support: Bilateral upper extremity supported;Feet supported Sitting balance-Leahy Scale: Poor Sitting balance - Comments: Sat EOB with UE support and min guard assist.    Standing balance support: During functional activity;Bilateral upper extremity supported Standing balance-Leahy Scale: Poor Standing balance comment: Pt able to stand with B UE and external support from therapist. unable to complete dynamic standing tasks without knee buckling                           ADL either performed or assessed with clinical judgement   ADL Overall ADL's : Needs assistance/impaired     Grooming: Min guard;Oral care (standing in [place]) Grooming Details (indicate cue type and reason): min guard for brushing teeth at sink in [place]. Slow processing with minor cues for problem solving. Rather than standing in [place] to rinse, pt decided to projectile spit into sink                               General ADL Comments: Pt continues with deficits in strength, balance, cognition and problem solving  though motivated to attempt tasks during session     Vision   Vision Assessment?: No apparent visual deficits   Perception     Praxis      Cognition Arousal/Alertness: Awake/alert Behavior During Therapy: WFL for tasks assessed/performed;Flat affect Overall Cognitive Status: Impaired/Different from  baseline Area of Impairment: Orientation;Attention;Memory;Following commands;Safety/judgement;Awareness;Problem solving                 Orientation Level: Disoriented to;Time;Situation Current Attention Level: Sustained Memory: Decreased short-term memory Following Commands: Follows one step commands with increased time Safety/Judgement: Decreased awareness of safety;Decreased awareness of deficits Awareness: Intellectual Problem Solving: Slow processing;Decreased initiation;Requires tactile cues General Comments: Pt with improved appropriate conversation today though tangential by end of session. Decreased awareness of deficits and problem solving        Exercises     Shoulder Instructions       General Comments HR up to 142bpm during activity, O2 on RA [facility]    Pertinent Vitals/ Pain       Pain Assessment: Faces Faces Pain Scale: No hurt Pain Intervention(s): Monitored during session  Home Living                                          Prior Functioning/Environment              Frequency  Min 2X/week        Progress Toward Goals  OT Goals(current goals can now be found in the care plan section)  Progress towards OT goals: Progressing toward goals  Acute Rehab OT Goals Patient Stated Goal: be able to use toilet OT Goal Formulation: With patient/family Time For Goal Achievement: 11/08/19 Potential to Achieve Goals: Good ADL Goals Pt Will Perform Eating: with set-up;sitting Pt Will Perform Grooming: with set-up;sitting Pt Will Perform Upper Body Bathing: with set-up;with supervision;sitting Pt Will Transfer to Toilet: bedside commode;squat pivot transfer;with +2 assist;with mod assist Additional ADL Goal #1: Pt will maintain midline postural control EOB with min A during ADL task Additional ADL Goal #2: Pt will require min vc to sustain attention to ADL task in minimally distracting environment  Plan Discharge plan remains appropriate     Co-evaluation    PT/OT/SLP Co-Evaluation/Treatment: Yes Reason for Co-Treatment: Complexity of the patient's impairments (multi-system involvement);For patient/therapist safety;To address functional/ADL transfers;Necessary to address cognition/behavior during functional activity   OT goals addressed during session: ADL's and self-care      AM-PAC OT "6 Clicks" Daily Activity     Outcome Measure   Help from another person eating meals?: A Little Help from another person taking care of personal grooming?: A Little Help from another person toileting, which includes using toliet, bedpan, or urinal?: Total Help from another person bathing (including washing, rinsing, drying)?: A Lot Help from another person to put on and taking off regular upper body clothing?: A Lot Help from another person to put on and taking off regular lower body clothing?: Total 6 Click Score: 12    End of Session Equipment Utilized During Treatment: Gait belt;Rolling walker  OT Visit Diagnosis: Unsteadiness on feet (R26.81);Other abnormalities of gait and mobility (R26.89);Muscle weakness (generalized) (M62.81);Other symptoms and signs involving cognitive function;Hemiplegia and hemiparesis;Pain Hemiplegia - Right/Left: Right Hemiplegia - dominant/non-dominant: Dominant Hemiplegia - caused by: Cerebral infarction Pain - Right/Left: Left Pain - part of body: Shoulder   Activity Tolerance Patient tolerated treatment well  Patient Left in chair;with call bell/phone within reach   Nurse Communication          Time: 1000-1027 OT Time Calculation (min): 27 min  Charges: OT General Charges $OT Visit: 1 Visit OT Treatments $Self Care/Home Management : 8-22 mins  Lorre Munroe, OTR/L   Lorre Munroe 10/31/2019, 11:14 AM

## 2019-10-31 NOTE — Progress Notes (Addendum)
Physical Therapy Treatment Patient Details Name: Micheal Bell MRN: [id number] DOB: 1970-10-06 Today's Date: 10/31/2019    History of Present Illness Pt is a 49 y.o. male admitted 10/09/19 for COVID PNA. Pt decompensated 8/19 and intubated; self extubated 8/23 and reintubated; extubated 8/28. Course complicated by MSSA and GBS bacteria, sepsis, systolic HF, acute metabolic encephalopathy. 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 TEE without evidence of vegitation. 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    PT Comments    Pt making steady progress. Continue to recommend CIR for further rehab. Pt's cognition improving but remains impaired. Pt appropriate with conversation during most of session but during the end wanted [place] to look out his window to see the man carved in the tree and then talked about how there was a newspaper article written about it.    Follow Up Recommendations  CIR;Supervision/Assistance - 24 hour     Equipment Recommendations       Recommendations for Other Services       Precautions / Restrictions Precautions Precautions: Fall;Other (comment) Precaution Comments: wounds on feet Restrictions Weight Bearing Restrictions: No    Mobility  Bed Mobility Overal bed mobility: Needs Assistance Bed Mobility: Supine to Sit     Supine to sit: Max assist;HOB elevated     General bed mobility comments: Pt required assist to advance R LE off of bed and bring trunk up to sitting EOB  Transfers Overall transfer level: Needs assistance Equipment used: Rolling walker (2 wheeled);Ambulation equipment used Transfers: Sit to/from Stand Sit to Stand: Mod assist;+2 physical assistance;From elevated surface          General transfer comment: Mod A x 2 for two sit to stand trials with RW and elevated bed, cues for safe hand placement. Pt Min A x 2 for sit to stand in [place], min guard to maintain safe placement in [place]. Used [place] for bed to chair transfer.  Ambulation/Gait                 Stairs             Wheelchair Mobility    Modified Rankin (Stroke Patients Only) Modified Rankin (Stroke Patients Only) Pre-Morbid Rankin Score: No symptoms Modified Rankin: Severe disability     Balance Overall balance assessment: Needs assistance Sitting-balance support: Bilateral upper extremity supported;Feet supported Sitting balance-Leahy Scale: Poor Sitting balance - Comments: Sat EOB with UE support and min guard assist.    Standing balance support: During functional activity;Bilateral upper extremity supported Standing balance-Leahy Scale: Poor Standing balance comment: Walker and +2 min assist for static standing with posterior LLE propped against bed and knee hyperextended. Lt knee begins buckling when not hyperextended. Pt stood 2 times with walker for ~ 30 sec. Stood 2 more times with [employer].                             Cognition Arousal/Alertness: Awake/alert Behavior During Therapy: WFL for tasks assessed/performed;Flat affect Overall Cognitive Status: Impaired/Different from baseline Area of Impairment: Orientation;Attention;Memory;Following commands;Safety/judgement;Awareness;Problem solving                 Orientation Level: Disoriented to;Time;Situation Current Attention Level: Sustained Memory: Decreased short-term memory Following Commands: Follows one step  commands with increased time Safety/Judgement: Decreased awareness of safety;Decreased awareness of deficits Awareness: Intellectual Problem Solving: Slow processing;Decreased initiation;Requires tactile cues General Comments: Pt with improved appropriate conversation today though  tangential by end of session. Decreased awareness of deficits and problem solving      Exercises      General Comments General comments (skin integrity, edema, etc.): RHR 112. HR with activity 142. On RA with SpO2 > 92%.       Pertinent Vitals/Pain Pain Assessment: No/denies pain Faces Pain Scale: No hurt Pain Intervention(s): Monitored during session    Home Living                      Prior Function            PT Goals (current goals can now be found in the care plan section) Acute Rehab PT Goals Patient Stated Goal: be able to use toilet Progress towards PT goals: Progressing toward goals    Frequency    Min 4X/week      PT Plan Current plan remains appropriate    Co-evaluation PT/OT/SLP Co-Evaluation/Treatment: Yes Reason for Co-Treatment: Complexity of the patient's impairments (multi-system involvement);For patient/therapist safety PT goals addressed during session: Mobility/safety with mobility;Proper use of DME OT goals addressed during session: ADL's and self-care      AM-PAC PT "6 Clicks" Mobility   Outcome Measure  Help needed turning from your back to your side while in a flat bed without using bedrails?: A Lot Help needed moving from lying on your back to sitting on the side of a flat bed without using bedrails?: A Lot Help needed moving to and from a bed to a chair (including a wheelchair)?: Total Help needed standing up from a chair using your arms (e.g., wheelchair or bedside chair)?: A Lot Help needed to walk in hospital room?: Total Help needed climbing 3-5 steps with a railing? : Total 6 Click Score: 9    End of Session Equipment Utilized During Treatment: Gait belt Activity Tolerance: Patient tolerated treatment well Patient left: in chair;with call bell/phone within reach;with chair alarm set Nurse Communication: Mobility status;Need for lift equipment PT Visit Diagnosis: Other abnormalities of gait and mobility (R26.89);Muscle  weakness (generalized) (M62.81);Other symptoms and signs involving the nervous system (R29.898)     Time: 1000-1028 PT Time Calculation (min) (ACUTE ONLY): 28 min  Charges:  $Therapeutic Activity: 8-22 mins                     [facility] PT Acute Rehabilitation Services Pager [phone] Office [phone]    Angelina Ok [facility] 10/31/2019, 1:06 PM

## 2019-10-31 NOTE — Progress Notes (Signed)
    [facility]   Reason for visit: Follow up on abscess, osteomyelitis, bacteremia  Interval History: no acute events.   Physical Exam: Constitutional:  Vitals:   10/31/19 0400 10/31/19 0807  BP: 112/73 129/81  Pulse: 93 85  Resp: 20 20  Temp: 98.7 F (37.1 C) 98.2 F (36.8 C)  SpO2: 91% 94%   patient appears in NAD  Impression: bacteremia with MSSA and GBS.  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  TEE without vegetation.  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   Plan: 1.  No changes - plan for 4 weeks total IV nafcillin.  If there are any issues with nafcillin (elevated creat, thrombocytopenia), would change to IV ceftriaxone 2 grams twice a day. Nafcillin through 9/29 then start keflex 500 mg 4 times a day He has follow up with me arranged October 13 at 10:30  I will sign off, call with questions.

## 2019-11-01 DIAGNOSIS — J80 Acute respiratory distress syndrome: Secondary | ICD-10-CM

## 2019-11-01 LAB — CBC WITH DIFFERENTIAL/PLATELET
Abs Immature Granulocytes: 0.06 10*3/uL (ref 0.00–0.07)
Basophils Absolute: 0 10*3/uL (ref 0.0–0.1)
Basophils Relative: 1 %
Eosinophils Absolute: 0.3 10*3/uL (ref 0.0–0.5)
Eosinophils Relative: 3 %
HCT: 30.8 % — ABNORMAL LOW (ref 39.0–52.0)
Hemoglobin: 9.7 g/dL — ABNORMAL LOW (ref 13.0–17.0)
Immature Granulocytes: 1 %
Lymphocytes Relative: 29 %
Lymphs Abs: 2.5 10*3/uL (ref 0.7–4.0)
MCH: 29.5 pg (ref 26.0–34.0)
MCHC: 31.5 g/dL (ref 30.0–36.0)
MCV: 93.6 fL (ref 80.0–100.0)
Monocytes Absolute: 0.5 10*3/uL (ref 0.1–1.0)
Monocytes Relative: 6 %
Neutro Abs: 5.2 10*3/uL (ref 1.7–7.7)
Neutrophils Relative %: 60 %
Platelets: 342 10*3/uL (ref 150–400)
RBC: 3.29 MIL/uL — ABNORMAL LOW (ref 4.22–5.81)
RDW: 12.7 % (ref 11.5–15.5)
WBC: 8.7 10*3/uL (ref 4.0–10.5)
nRBC: 0 % (ref 0.0–0.2)

## 2019-11-01 LAB — COMPREHENSIVE METABOLIC PANEL
ALT: 18 U/L (ref 0–44)
AST: 19 U/L (ref 15–41)
Albumin: 2 g/dL — ABNORMAL LOW (ref 3.5–5.0)
Alkaline Phosphatase: 89 U/L (ref 38–126)
Anion gap: 12 (ref 5–15)
BUN: 6 mg/dL (ref 6–20)
CO2: 24 mmol/L (ref 22–32)
Calcium: 8.5 mg/dL — ABNORMAL LOW (ref 8.9–10.3)
Chloride: 97 mmol/L — ABNORMAL LOW (ref 98–111)
Creatinine, Ser: 0.62 mg/dL (ref 0.61–1.24)
GFR calc Af Amer: >60 mL/min (ref >60)
GFR calc non Af Amer: >60 mL/min (ref >60)
Glucose, Bld: 228 mg/dL — ABNORMAL HIGH (ref 70–99)
Potassium: 3.6 mmol/L (ref 3.5–5.1)
Sodium: 133 mmol/L — ABNORMAL LOW (ref 135–145)
Total Bilirubin: 0.6 mg/dL (ref 0.3–1.2)
Total Protein: 6.1 g/dL — ABNORMAL LOW (ref 6.5–8.1)

## 2019-11-01 LAB — GLUCOSE, CAPILLARY
Glucose-Capillary: 143 mg/dL — ABNORMAL HIGH (ref 70–99)
Glucose-Capillary: 165 mg/dL — ABNORMAL HIGH (ref 70–99)
Glucose-Capillary: 191 mg/dL — ABNORMAL HIGH (ref 70–99)
Glucose-Capillary: 194 mg/dL — ABNORMAL HIGH (ref 70–99)
Glucose-Capillary: 212 mg/dL — ABNORMAL HIGH (ref 70–99)
Glucose-Capillary: 216 mg/dL — ABNORMAL HIGH (ref 70–99)

## 2019-11-01 LAB — MAGNESIUM: Magnesium: 1.5 mg/dL — ABNORMAL LOW (ref 1.7–2.4)

## 2019-11-01 MED ORDER — ACETAMINOPHEN 325 MG PO TABS
650.0000 mg | ORAL_TABLET | Freq: Four times a day (QID) | ORAL | Status: DC | PRN
  Administered 2019-11-13 – 2019-11-14 (×2): 650 mg via ORAL
  Filled 2019-11-01 (×2): qty 2

## 2019-11-01 MED ORDER — STROKE: EARLY STAGES OF RECOVERY BOOK
Freq: Once | Status: AC
  Filled 2019-11-01: qty 1

## 2019-11-01 MED ORDER — OXYCODONE HCL 5 MG PO TABS
5.0000 mg | ORAL_TABLET | Freq: Once | ORAL | Status: AC
  Administered 2019-11-01: 5 mg via ORAL
  Filled 2019-11-01: qty 1

## 2019-11-01 MED ORDER — MAGNESIUM SULFATE 50 % IJ SOLN
3.0000 g | Freq: Once | INTRAVENOUS | Status: AC
  Administered 2019-11-01: 3 g via INTRAVENOUS
  Filled 2019-11-01: qty 6

## 2019-11-01 MED ORDER — HYDROCODONE-ACETAMINOPHEN 5-325 MG PO TABS
1.0000 | ORAL_TABLET | Freq: Two times a day (BID) | ORAL | Status: DC | PRN
  Administered 2019-11-01 – 2019-11-15 (×21): 1 via ORAL
  Filled 2019-11-01 (×23): qty 1

## 2019-11-01 NOTE — Care Management (Signed)
Medicaid letter was faxed to [facility] [phone] from patients sister. Will follow up with CIR to see if they have received it and if patient can be accepted to rehab.

## 2019-11-01 NOTE — Progress Notes (Signed)
PROGRESS NOTE                                                                                                                                                                                                             Patient Demographics:    Micheal Bell, is a 49 y.o. male, DOB - 1970/09/25, ZOX:[id number]  Outpatient Primary MD for the patient is [name], MD    LOS - 20  Admit date - 10/09/2019    Chief Complaint  Patient presents with  . Cough       Brief Narrative - 49 yo male with hx of HTN, T2DM, gout, IBS, fibromyalgia, obesity who presented on 8/16 for COVID pneumonia.  He decompensated on 8/19 and was intubated.  Self extubated on 8/23 and was reintubated.  He's subsequently been extubated on 8/28.  He's been on typical therapies for covid including steroids, baricitinib and remdesivir.  His hospitalization was complicated by MSSA and GBS bacteria.  Also found to have systolic heart failure.  Transferred to myservice on 10/25/19   Subjective:   Patient in bed, appears comfortable, denies any headache, no fever, no chest pain or pressure, no shortness of breath , no abdominal pain. No focal weakness.   Assessment  & Plan :   Acute Hypoxic Resp. Failure due to Acute Covid 19 Viral Pneumonitis during the ongoing 2020 Covid 19 Pandemic - he had severe hypoxia and parenchymal lung injury he was intubated and admitted by ICU, he was started on IV steroids, remdesivir and Baricitinib on 10/12/2019.  He was subsequently extubated on 10/21/2019.  His stay was complicated by bacteremia after which Baricitinib was discontinued on 10/24/2019.  His pulmonary status is gradually improving, currently he is stable and symptom-free on 1L nasal cannula oxygen.  Encouraged the patient to sit up in chair in the daytime use I-S and flutter valve for pulmonary toiletry and then prone in bed when at night.  Will advance activity and  titrate down oxygen as possible.   Recent Labs  Lab 10/25/19 1600 10/25/19 1600 10/26/19 0402 10/26/19 0402 10/27/19 0500 10/28/19 0453 10/28/19 0454 10/29/19 0409 10/29/19 0410 10/30/19 0320 10/31/19 0411 11/01/19 0152  WBC  --   --  14.3*   < > 13.1* 10.7*  --  10.5  --   --  8.3 8.7  PLT  --   --  396   < > 339 372  --  363  --   --  338 342  CRP  --   --  5.4*  --  9.2* 8.8*  --  8.4*  --   --   --   --   BNP  --   --  38.0  --  54.1  --  46.0  --  76.9  --   --   --   DDIMER  --   --  6.29*  --  5.77* 5.74*  --  5.18*  --   --   --   --   PROCALCITON <0.10   < > <0.10  --  0.13 <0.10  --  <0.10  --  <0.10  --   --   AST  --   --  23   < > 28 24  --  21  --   --  18 19  ALT  --   --  16   < > 17 18  --  17  --   --  17 18  ALKPHOS  --   --  89   < > 93 92  --  86  --   --  85 89  BILITOT  --   --  1.2   < > 1.0 0.6  --  0.8  --   --  0.7 0.6  ALBUMIN  --   --  2.3*   < > 2.2* 2.1*  --  2.1*  --   --  2.1* 2.0*  INR 1.2  --   --   --   --   --   --   --   --   --   --   --    < > = values in this interval not displayed.    Sepsis with MSSA and Group B strep bacteremia due to left foot abscess.   -ID following, was on IV Ancef but switch to nafcillin on 10/26/2019.  Clean TEE, history of Charcot joints, peripheral neuropathy and recent left foot soft tissue injury, MRI left foot shows an abscess, Ortho was consulted and they recommended medical management with outpatient follow-up with Dr. [name] post discharge, also has osteomyelitis, defer duration of treatment with antibiotics to ID.  Antibiotics management per ID, to continue next insulin via PICC line through 9/29, thereafter on Keflex, and to follow with ID Dr. Benay Spice 10/13 after discharge.  .   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  For seizures he has been started on Keppra with good effect and his mentation has improved considerably. Continue PT OT and speech, SNF VS CIR .  Neuro following.   Possible systolic heart failure as shown by TTE however this could have been transient due to sepsis, on repeat TEE his EF is now normalized to 50 to 55%.  He is currently compensated, blood pressure was too low and after hydration has improved, continue beta-blocker.   Cyanotic right lower extremity toes.  Likely due to hypoperfusion which is transient, arterial ultrasound stable.  History of chronic pain.  On multiple narcotics, Flexeril and benzodiazepines.  All on hold in the hospital.  HTN - BP was low on multiple blood pressure medications, was hydrated now able to tolerate low-dose beta-blocker.  Elevated D-dimer, positive acute DVT in the right lower extremity.  Switched to full dose Lovenox.   Hypomagnesemia.  Replaced.    DM type II.  Currently on Lantus and sliding scale monitor.  Lab Results  Component Value Date   HGBA1C 11.3 (H) 10/26/2019   CBG (last 3)  Recent Labs    11/01/19 0412 11/01/19 0725 11/01/19 1141  GLUCAP 194* 143* 191*      Condition -   Guarded  Family Communication  :  Sister and mother [phone] - 10/25/19, 10/27/19, 10/28/19  Code Status :  Full  Consults  :  PCCM, ID, Cards  Procedures  :    EEG - This study showed evidence of potential epileptogenicity arising from bilateral frontocentral, maximal vertex region. The sharp waves are at times rhythmic consistent with brief ictal-interictal rhythmic discharges. Additionally, there is evidence of mild diffuse encephalopathy, nonspecific etiology.  Bilateral lower extremity venous ultrasound - Acute right lower extremity popliteal vein DVT.  Carotid duplex.  Nonacute.    MRI -  Peripherally enhancing lesion at the base of the left precentral gyrus, favored to indicate cerebritis in this bacteremic patient.  CT Head -  1. No acute intracranial abnormality. 2. Area of subcortical low-density involving the posterior left frontal lobe at the convexity, likely encephalomalacia and sequela of remote infarct. There are no prior exams for comparison to establish chronicity. MRI could be considered for further evaluation based on clinical concern. 3. Paranasal sinus fluid levels on opacification of the right greater than left mastoid air cells, possibly related to intubation.  R. Leg Arterial US -  No evidence of arterial occlusive disease. Normal waveforms, no stenosis  TTE -  1. Extremely poor acoustic windows. LVEF appears to be depressed with diffuse hypokinesis, worse in the mid/distal inferior/inferoseptal walls. . Left ventricular ejection fraction, by estimation, is 30 to 35%. The left ventricle has moderately decreased function. The left ventricular internal cavity size was mildly dilated. Left ventricular diastolic parameters are indeterminate.  2. Right ventricular systolic function is moderately reduced. The right ventricular size is mildly enlarged.  3. The mitral valve is normal in structure. Trivial mitral valve regurgitation.  4. The aortic valve is normal in structure. Aortic valve regurgitation is not visualized  TEE - 1. Left ventricular ejection fraction, by estimation, is 50 to 55%. The left ventricle has low normal function. The left ventricle has no regional wall motion abnormalities.  2. Right ventricular systolic function is normal. The right ventricular size is normal.  3. No left atrial/left atrial appendage thrombus was detected.  4. The mitral valve is normal in structure. Trivial mitral valve regurgitation. No evidence of mitral stenosis.  5. The aortic valve is normal in structure. Aortic valve regurgitation is not visualized. No aortic stenosis is present.   6. The inferior vena cava is normal in size with greater than 50% respiratory variability, suggesting right atrial pressure of 3 mmHg.  CT - 1. No acute intracranial abnormality. 2. Area of subcortical low-density involving the posterior left frontal lobe at the convexity, likely encephalomalacia and sequela of remote infarct. There are no prior exams for comparison to establish chronicity. MRI could be considered for further evaluation based on clinical concern. 3. Paranasal sinus fluid levels on opacification of the  right greater than left mastoid air cells, possibly related to intubation    PUD Prophylaxis : PPI  Disposition Plan  :    Status is: Inpatient  Remains inpatient appropriate because:IV treatments appropriate due to intensity of illness or inability to take PO   Dispo: The patient is from: Home              Anticipated d/c is to: Home              Anticipated d/c date is: 1 day              Patient currently is medically stable to d/c. to CIR when bed is available.   DVT Prophylaxis  :  Lovenox    Lab Results  Component Value Date   PLT 342 11/01/2019    Diet :  Diet Order            Diet heart healthy/carb modified Room service appropriate? No; Fluid consistency: Thin  Diet effective now                  Inpatient Medications  Scheduled Meds: . alteplase  2 mg Intracatheter Once  . atorvastatin  40 mg Oral Daily  . buPROPion  150 mg Oral Daily  . Chlorhexidine Gluconate Cloth  6 each Topical Daily  . docusate sodium  100 mg Oral BID  . enoxaparin (LOVENOX) injection  120 mg Subcutaneous Q12H  . feeding supplement (ENSURE ENLIVE)  237 mL Oral TID BM  . insulin aspart  0-15 Units Subcutaneous Q4H  . insulin glargine  20 Units Subcutaneous Daily  . Ipratropium-Albuterol  1 puff Inhalation Q6H  . levETIRAcetam  500 mg Oral BID  . linagliptin  5 mg Oral Daily  . melatonin  3 mg Oral QHS  . metoprolol tartrate  50 mg Oral BID  . pantoprazole  40 mg Oral  Daily  . polyethylene glycol  17 g Oral Daily  . senna  2 tablet Oral BID  . sodium chloride flush  10-40 mL Intracatheter Q12H   Continuous Infusions: . sodium chloride 10 mL/hr at 10/30/19 1622  . nafcillin (NAFCIL) continuous infusion 12 g (10/31/19 1324)   PRN Meds:.sodium chloride, acetaminophen, bisacodyl  Antibiotics  :    Anti-infectives (From admission, onward)   Start     Dose/Rate Route Frequency Ordered Stop   10/31/19 1415  nafcillin 12 g in sodium chloride 0.9 % 500 mL continuous infusion  Status:  Discontinued        12 g 20.8 mL/hr over 24 Hours Intravenous Every 24 hours 10/31/19 0608 10/31/19 0609   10/29/19 1415  nafcillin 12 g in dextrose 5 % 500 mL IVPB  Status:  Discontinued        12 g 20.8 mL/hr over 24 Hours Intravenous Every 24 hours 10/29/19 0434 10/29/19 0507   10/29/19 1415  nafcillin 12 g in sodium chloride 0.9 % 500 mL continuous infusion        12 g 20.8 mL/hr over 24 Hours Intravenous Every 24 hours 10/29/19 0507     10/26/19 1415  nafcillin 12 g in dextrose 5 % 500 mL IVPB  Status:  Discontinued        12 g 20.8 mL/hr over 24 Hours Intravenous Every 24 hours 10/26/19 1414 10/29/19 0434   10/26/19 1400  nafcillin injection 12 g  Status:  Discontinued        12 g Intravenous Daily 10/26/19 1238 10/26/19 1242  10/26/19 1400  nafcillin 12 g in dextrose 5 % 50 mL IVPB  Status:  Discontinued        12 g 100 mL/hr over 30 Minutes Intravenous Every 24 hours 10/26/19 1242 10/26/19 1339   10/26/19 1400  nafcillin 12 g in dextrose 5 % 500 mL IVPB  Status:  Discontinued        12 g 20.8 mL/hr over 24 Hours Intravenous Every 24 hours 10/26/19 1337 10/26/19 1416   10/26/19 1200  nafcillin 2 g in sodium chloride 0.9 % 100 mL IVPB  Status:  Discontinued        2 g 200 mL/hr over 30 Minutes Intravenous Every 4 hours 10/26/19 1010 10/26/19 1238   10/12/19 2200  cefTRIAXone (ROCEPHIN) 2 g in sodium chloride 0.9 % 100 mL IVPB  Status:  Discontinued        2  g 200 mL/hr over 30 Minutes Intravenous Every 24 hours 10/12/19 0441 10/12/19 0921   10/12/19 0930  ceFAZolin (ANCEF) IVPB 2g/100 mL premix  Status:  Discontinued        2 g 200 mL/hr over 30 Minutes Intravenous Every 8 hours 10/12/19 0921 10/26/19 1010   10/11/19 1000  remdesivir 100 mg in sodium chloride 0.9 % 100 mL IVPB       "Followed by" Linked Group Details   100 mg 200 mL/hr over 30 Minutes Intravenous Daily 10/10/19 0050 10/14/19 0956   10/11/19 0800  vancomycin (VANCOCIN) IVPB 1000 mg/200 mL premix  Status:  Discontinued        1,000 mg 200 mL/hr over 60 Minutes Intravenous Every 8 hours 10/10/19 2239 10/12/19 0921   10/10/19 2230  vancomycin (VANCOCIN) IVPB 1000 mg/200 mL premix        1,000 mg 200 mL/hr over 60 Minutes Intravenous Every 1 hr x 2 10/10/19 2208 10/11/19 0052   10/10/19 2215  cefTRIAXone (ROCEPHIN) 2 g in sodium chloride 0.9 % 100 mL IVPB  Status:  Discontinued        2 g 200 mL/hr over 30 Minutes Intravenous Every 24 hours 10/10/19 2205 10/12/19 0441   10/10/19 0100  remdesivir 100 mg in sodium chloride 0.9 % 100 mL IVPB       "Followed by" Linked Group Details   100 mg 200 mL/hr over 30 Minutes Intravenous Every 30 min 10/10/19 0050 10/10/19 0332        Mliss Fritz Kindra Bickham M.D on 11/01/2019 at 2:30 PM  To page go to [email or web address] -  [facility] -  Office  [phone]   See all Orders from today for further details    Objective:   Vitals:   11/01/19 0002 11/01/19 0410 11/01/19 0745 11/01/19 1208  BP: 107/71 126/69 (!) 114/92 116/75  Pulse: (!) 102 86 80 90  Resp: 18 16  18   Temp: 98.6 F (37 C) 99.1 F (37.3 C) 98.1 F (36.7 C) 98 F (36.7 C)  TempSrc: Oral Oral Oral Axillary  SpO2: 91% 90% 94% 94%  Weight:  113.7 kg    Height:        Wt Readings from Last 3 Encounters:  11/01/19 113.7 kg  09/11/19 111.1 kg  05/12/17 (!) 145.2 kg     Intake/Output Summary (Last 24 hours) at 11/01/2019 1430 Last data filed at 11/01/2019 0603 Gross  per 24 hour  Intake 840 ml  Output 1650 ml  Net -810 ml     Physical Exam   chronic right-sided weakness, deltoid weakness in both extremities,  Awake Alert, Oriented X 3, No new F.N deficits, Normal affect Symmetrical Chest wall movement, Good air movement bilaterally, CTAB RRR,No Gallops,Rubs or new Murmurs, No Parasternal Heave +ve B.Sounds, Abd Soft, No tenderness, No rebound - guarding or rigidity. No Cyanosis, Clubbing or edema, No new Rash or bruise   Mild cyanosis of the right toes, both ankles under bandage,    Data Review:    CBC Recent Labs  Lab 10/27/19 0500 10/28/19 0453 10/29/19 0409 10/31/19 0411 11/01/19 0152  WBC 13.1* 10.7* 10.5 8.3 8.7  HGB 10.4* 10.6* 9.7* 10.1* 9.7*  HCT 31.5* 32.2* 30.8* 30.5* 30.8*  PLT 339 372 363 338 342  MCV 92.4 93.6 93.1 91.9 93.6  MCH 30.5 30.8 29.3 30.4 29.5  MCHC 33.0 32.9 31.5 33.1 31.5  RDW 12.3 12.5 12.4 12.5 12.7  LYMPHSABS 2.2 1.9 2.8 2.3 2.5  MONOABS 0.7 0.5 0.6 0.6 0.5  EOSABS 0.1 0.1 0.2 0.3 0.3  BASOSABS 0.0 0.1 0.0 0.0 0.0    Recent Labs  Lab 10/25/19 1600 10/25/19 1600 10/26/19 0402 10/26/19 0402 10/26/19 0439 10/27/19 0500 10/28/19 0453 10/28/19 0454 10/29/19 0409 10/29/19 0410 10/30/19 0320 10/31/19 0411 11/01/19 0152  NA  --   --  134*   < >  --  135 132*  --  134*  --   --  134* 133*  K  --   --  3.5   < >  --  3.5 3.5  --  3.7  --   --  3.7 3.6  CL  --   --  101   < >  --  102 99  --  100  --   --  98 97*  CO2  --   --  24   < >  --  25 25  --  26  --   --  27 24  GLUCOSE  --   --  111*   < >  --  134* 205*  --  145*  --   --  139* 228*  BUN  --   --  12   < >  --  11 8  --  7  --   --  6 6  CREATININE  --   --  0.75   < >  --  0.73 0.75  --  0.67  --   --  0.59* 0.62  CALCIUM  --   --  8.6*   < >  --  8.5* 8.3*  --  8.5*  --   --  8.9 8.5*  AST  --   --  23   < >  --  28 24  --  21  --   --  18 19  ALT  --   --  16   < >  --  17 18  --  17  --   --  17 18  ALKPHOS  --   --  89   < >   --  93 92  --  86  --   --  85 89  BILITOT  --   --  1.2   < >  --  1.0 0.6  --  0.8  --   --  0.7 0.6  ALBUMIN  --   --  2.3*   < >  --  2.2* 2.1*  --  2.1*  --   --  2.1* 2.0*  MG  --   --  1.5*   < >  --  1.7 1.4*  --  1.4*  --   --  1.3* 1.5*  CRP  --   --  5.4*  --   --  9.2* 8.8*  --  8.4*  --   --   --   --   DDIMER  --   --  6.29*  --   --  5.77* 5.74*  --  5.18*  --   --   --   --   PROCALCITON <0.10   < > <0.10  --   --  0.13 <0.10  --  <0.10  --  <0.10  --   --   INR 1.2  --   --   --   --   --   --   --   --   --   --   --   --   TSH  --   --   --   --  4.137  --   --   --   --   --   --   --   --   HGBA1C  --   --  11.3*  --   --   --   --   --   --   --   --   --   --   BNP  --   --  38.0  --   --  54.1  --  46.0  --  76.9  --   --   --    < > = values in this interval not displayed.    ------------------------------------------------------------------------------------------------------------------ No results for input(s): CHOL, HDL, LDLCALC, TRIG, CHOLHDL, LDLDIRECT in the last 72 hours.  Lab Results  Component Value Date   HGBA1C 11.3 (H) 10/26/2019   ------------------------------------------------------------------------------------------------------------------ No results for input(s): TSH, T4TOTAL, T3FREE, THYROIDAB in the last 72 hours.  Invalid input(s): FREET3  Cardiac Enzymes No results for input(s): CKMB, TROPONINI, MYOGLOBIN in the last 168 hours.  Invalid input(s): CK ------------------------------------------------------------------------------------------------------------------    Component Value Date/Time   BNP 76.9 10/29/2019 0410    Micro Results Recent Results (from the past 240 hour(s))  Culture, blood (routine x 2)     Status: None   Collection Time: 10/23/19 10:58 AM   Specimen: BLOOD  Result Value Ref Range Status   Specimen Description BLOOD LEFT ANTECUBITAL  Final   Special Requests   Final    BOTTLES DRAWN AEROBIC ONLY Blood  Culture adequate volume   Culture   Final    NO GROWTH 5 DAYS Performed at [facility], [address] [place], [place] [address]    Report Status 10/28/2019 FINAL  Final  Culture, blood (routine x 2)     Status: None   Collection Time: 10/23/19 11:00 AM   Specimen: BLOOD LEFT HAND  Result Value Ref Range Status   Specimen Description BLOOD LEFT HAND  Final   Special Requests   Final    BOTTLES DRAWN AEROBIC ONLY Blood Culture adequate volume   Culture   Final    NO GROWTH 5 DAYS Performed at [facility], [address] [place], [place] [address]    Report Status 10/28/2019 FINAL  Final    Radiology Reports CT HEAD WO CONTRAST  Result Date: 10/19/2019 CLINICAL DATA:  Deliriums.  Mental status change. EXAM: CT HEAD WITHOUT CONTRAST TECHNIQUE: Contiguous axial images were obtained from the base of the skull through the  vertex without intravenous contrast. COMPARISON:  None. FINDINGS: Brain: Area subcortical low-density involving the posterior left frontal lobe at the convexity, series 3, image 25 and series 5, image 49, likely encephalomalacia and sequela of remote infarct, however nonspecific. No hemorrhage. No evidence of acute ischemia. No hydrocephalus, midline shift or mass effect. No subdural or extra-axial collection. Vascular: Atherosclerosis of skullbase vasculature without hyperdense vessel or abnormal calcification. Skull: No fracture or focal lesion. Sinuses/Orbits: Nasogastric tube in place. Fluid levels in the sphenoid sinuses and right maxillary sinus with scattered opacification of ethmoid air cells may be related to intubation. Opacification of the right greater than left mastoid air cells. Other: None. IMPRESSION: 1. No acute intracranial abnormality. 2. Area of subcortical low-density involving the posterior left frontal lobe at the convexity, likely encephalomalacia and sequela of remote infarct. There are no prior exams for comparison to establish chronicity.  MRI could be considered for further evaluation based on clinical concern. 3. Paranasal sinus fluid levels on opacification of the right greater than left mastoid air cells, possibly related to intubation. Electronically Signed   By: Narda Rutherford M.D.   On: 10/19/2019 15:30   CT ANGIO CHEST PE W OR WO CONTRAST  Result Date: 10/23/2019 CLINICAL DATA:  COVID pneumonia, acute hypoxic respiratory failure, positive D-dimer EXAM: CT ANGIOGRAPHY CHEST WITH CONTRAST TECHNIQUE: Multidetector CT imaging of the chest was performed using the standard protocol during bolus administration of intravenous contrast. Multiplanar CT image reconstructions and MIPs were obtained to evaluate the vascular anatomy. CONTRAST:  36mL OMNIPAQUE IOHEXOL 350 MG/ML SOLN COMPARISON:  None. FINDINGS: Cardiovascular: Satisfactory opacification of the pulmonary arteries to the segmental level. No evidence of pulmonary embolism. The central pulmonary arteries are of normal caliber. There is moderate coronary artery calcification. Normal heart size. No pericardial effusion. The thoracic aorta is unremarkable save for bovine arch anatomy. Right upper extremity PICC line tip is seen within the superior vena cava. Mediastinum/Nodes: No enlarged mediastinal, hilar, or axillary lymph nodes. Thyroid gland, trachea, and esophagus demonstrate no significant findings. Nasoenteric feeding tube extends into the upper abdomen beyond the margin of the examination. Lungs/Pleura: Lung volumes are small. Evaluation is limited by motion artifact, however, multifocal pulmonary infiltrates are identified demonstrating a peripheral and basal predominance, likely infectious or inflammatory in the acute setting. No central obstructing lesion. No pneumothorax or pleural effusion. No superimposed interstitial edema. Upper Abdomen: No acute abnormality. Musculoskeletal: No chest wall abnormality. No acute or significant osseous findings. Review of the MIP images  confirms the above findings. IMPRESSION: 1. No evidence of pulmonary embolism. 2. Multifocal pulmonary infiltrates are identified demonstrating a peripheral and basal predominance, likely infectious or inflammatory in the acute setting. 3. Moderate coronary artery calcification. Electronically Signed   By: Helyn Numbers MD   On: 10/23/2019 16:25   MR ANGIO HEAD WO CONTRAST  Result Date: 10/29/2019 CLINICAL DATA:  Neuro deficit with stroke suspected EXAM: MRA HEAD WITHOUT CONTRAST TECHNIQUE: Angiographic images of the Circle of Willis were obtained using MRA technique without intravenous contrast. COMPARISON:  Four days ago FINDINGS: History of bacteremia and embolic infarction. No vessel beading, stenosis, or pseudoaneurysm. IMPRESSION: Negative intracranial MRA. Electronically Signed   By: Marnee Spring M.D.   On: 10/29/2019 12:04   MR BRAIN WO CONTRAST  Result Date: 10/25/2019 CLINICAL DATA:  Neuro deficit, acute, stroke suspected. Additional history provided: COVID positive. Additional history obtained from electronic MEDICAL RECORD NUMBERSepsis with MSSA and group B strep bacteremia. EXAM: MRI HEAD WITHOUT CONTRAST TECHNIQUE: Multiplanar,  multiecho pulse sequences of the brain and surrounding structures were obtained without intravenous contrast. COMPARISON:  Head CT 10/19/2019. FINDINGS: Brain: The examination is limited by poor signal on several sequences as well as motion degradation. Most notably, there is moderate motion degradation of the axial T2 weighted sequence, severe motion degradation of the axial SWI sequence and moderate motion degradation of the coronal T2 weighted sequence. Cerebral volume is normal for age. There is a 17 mm focus of restricted diffusion and corresponding T2/FLAIR hyperintensity within the subcortical left frontoparietal lobes (for instance as seen on series 2, image 39) (series 3, image 17). Additional mild scattered T2/FLAIR hyperintensity within the cerebral white matter  and pons is nonspecific, but consistent with chronic small vessel ischemic disease. Severe motion degradation of the axial SWI sequence precludes adequate evaluation for intracranial blood products. No extra-axial fluid collection. No midline shift. Vascular: Expected proximal arterial flow voids. Skull and upper cervical spine: No focal marrow lesion is identified within described limitations. Sinuses/Orbits: Visualized orbits show no acute finding. Air-fluid levels within the sphenoid and right maxillary sinuses. Partial opacification of right ethmoid air cells. Bilateral mastoid effusions. These results will be called to the ordering clinician or representative by the Radiologist Assistant, and communication documented in the PACS or [employer]. IMPRESSION: Examination limited by poor signal on several sequences as well as motion degradation, as described. 17 mm focus of restricted diffusion and T2/FLAIR hyperintensity within the subcortical left frontoparietal lobes. This finding is nonspecific, but given the patient's history, the primary differential considerations are acute/early subacute infarct versus cerebritis. Consider contrast-enhanced MR imaging of the brain for further evaluation of this lesion. Mild chronic small vessel ischemic disease. Paranasal sinus disease with air-fluid levels. Bilateral mastoid effusions. Electronically Signed   By: Jackey Loge DO   On: 10/25/2019 15:16   MR BRAIN W CONTRAST  Result Date: 10/25/2019 CLINICAL DATA:  Brain mass follow-up EXAM: MRI HEAD WITH CONTRAST TECHNIQUE: Multiplanar, multiecho pulse sequences of the brain and surrounding structures were obtained with intravenous contrast. CONTRAST:  10mL GADAVIST GADOBUTROL 1 MMOL/ML IV SOLN COMPARISON:  Brain MRI without contrast 10/25/2019 FINDINGS: Brain: There is a peripherally enhancing lesion at the base of the left precentral gyrus that measures 0.9 x 0.7 x 1.8 cm. There are no other areas of abnormal  contrast enhancement. 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 Electronically Signed   By: Deatra Robinson M.D.   On: 10/25/2019 22:47   MR FOOT LEFT WO CONTRAST  Result Date: 10/25/2019 CLINICAL DATA:  Bacteremia left foot pain and diabetic EXAM: MRI OF THE LEFT FOOT WITHOUT CONTRAST TECHNIQUE: Multiplanar, multisequence MR imaging of the left was performed. No intravenous contrast was administered. COMPARISON:  October 23, 2019 FINDINGS: Bones/Joint/Cartilage Mildly angulated nondisplaced fractures of the midshaft of the fourth and fifth metatarsals are noted. There is periosteal reaction seen along the medial margins of the metatarsal shafts. Increased heterogeneous T2 hyperintense signal with subtle T1 hypointensity seen at the base of the fourth and fifth metatarsals. No other osseous marrow signal abnormality is seen. There is no large knee joint effusions noted. The articular surfaces appear to be maintained. Ligaments The Lisfranc ligament is intact. Muscles and Tendons Increased feathery signal with atrophy is seen throughout the muscles of the forefoot. There is a small amount of fluid seen surrounding the posterior tibialis tendon. The remainder of the flexor and extensor tendons are intact. Soft tissues  Lateral plantar surface of the forefoot overlying the fifth metatarsal base there is a focal area of ulceration measuring approximately 8 mm in transverse dimension. A fluid-filled sinus tract is seen extending to the overlying osseous surface. There is a multilocular fluid collection which extends around the dorsal surface of the fifth metatarsal measuring approximately 5.4 x 0.9 x 3.4 cm. There is extensive dorsal and lateral subcutaneous edema and skin thickening. IMPRESSION: Superficial area of ulceration over the plantar lateral aspect of the fifth metatarsal base with a fluid-filled sinus tract and  loculated probable abscess extending over the dorsal surface of the fifth metatarsal measuring 5.4 x 0.9 by is a 3.4 cm. Incomplete mildly angulated fourth and fifth metatarsal shaft fractures with periosteal reaction Findings which could be suggestive of reactive marrow versus early osteomyelitis involving the base of the fourth and fifth metatarsals. Electronically Signed   By: Jonna Clark M.D.   On: 10/25/2019 19:06   DG Chest Port 1 View  Result Date: 10/26/2019 CLINICAL DATA:  Short of breath.  COVID positive EXAM: PORTABLE CHEST 1 VIEW COMPARISON:  10/25/2019 FINDINGS: Hypoventilation with decreased lung volume. Mild bibasilar airspace disease unchanged. No new infiltrate or effusion. Right arm PICC tip in the SVC unchanged. IMPRESSION: Hypoventilation with bibasilar mild airspace disease unchanged. Electronically Signed   By: Marlan Palau M.D.   On: 10/26/2019 16:10   DG Chest Port 1 View  Result Date: 10/25/2019 CLINICAL DATA:  Shortness of breath. EXAM: PORTABLE CHEST 1 VIEW COMPARISON:  October 16, 2019. FINDINGS: The heart size and mediastinal contours are within normal limits. Hypoinflation of the lungs is noted with mild bibasilar subsegmental atelectasis. Endotracheal tube has been removed. Right-sided PICC line is unchanged in position. The visualized skeletal structures are unremarkable. IMPRESSION: Hypoinflation of the lungs with mild bibasilar subsegmental atelectasis. Electronically Signed   By: Lupita Raider M.D.   On: 10/25/2019 08:37   DG CHEST PORT 1 VIEW  Result Date: 10/16/2019 CLINICAL DATA:  Status post intubation. EXAM: PORTABLE CHEST 1 VIEW COMPARISON:  10/14/2019 FINDINGS: ET tube tip is above the carina. There is a right arm PICC line with tip at the cavoatrial junction. Lung volumes are low. Similar appearance of bilateral pulmonary opacities compatible with multifocal pneumonia. IMPRESSION: 1. Stable support apparatus. 2. Persistent bilateral pulmonary opacities  compatible with multifocal pneumonia. Electronically Signed   By: Signa Kell M.D.   On: 10/16/2019 06:14   DG Chest Port 1 View  Result Date: 10/15/2019 CLINICAL DATA:  49 year old male with history of COVID infection. EXAM: PORTABLE CHEST 1 VIEW COMPARISON:  Chest x-ray 10/14/2019. FINDINGS: An endotracheal tube is in place with tip 3.6 cm above the carina. A nasogastric tube is seen extending into the stomach, however, the tip of the nasogastric tube extends below the lower margin of the image. There is a right upper extremity PICC with tip terminating in the right atrium. Lung volumes are low. Widespread patchy areas of interstitial prominence an ill-defined airspace disease again noted throughout the lungs bilaterally. Overall, aeration is essentially unchanged. No pleural effusions. No evidence of pulmonary edema. No pneumothorax. Heart size is normal. Upper mediastinal contours are within normal limits. IMPRESSION: 1. Support apparatus, as above. 2. The appearance the chest is compatible with multilobar pneumonia from reported COVID infection. Electronically Signed   By: Trudie Reed M.D.   On: 10/15/2019 05:39   DG Chest Port 1 View  Result Date: 10/14/2019 CLINICAL DATA:  COVID-19, ARDS EXAM: PORTABLE CHEST 1 VIEW  COMPARISON:  10/13/2019 FINDINGS: Single frontal view of the chest demonstrates stable position of the endotracheal tube and enteric catheter. There is a right-sided PICC tip overlying superior vena cava. The cardiac silhouette is stable. Interstitial and ground-glass opacities are seen throughout the lungs bilaterally, unchanged. Lung volumes are diminished. No effusion or pneumothorax. IMPRESSION: 1. 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 2. Stable support devices. Electronically Signed   By: Sharlet Salina M.D.   On: 10/14/2019 15:57   DG Chest Port 1 View  Result Date:  10/13/2019 CLINICAL DATA:  Acute respiratory failure with hypoxemia, COVID positive, asthma, diabetes mellitus, hypertension, CHF EXAM: PORTABLE CHEST 1 VIEW COMPARISON:  Portable exam 1021 hours compared to 10/12/2019 FINDINGS: Tip of endotracheal tube projects 3.7 cm above carina. Nasogastric tube extends into stomach. RIGHT arm PICC line tip projects over cavoatrial junction. Stable heart size mediastinal contours for technique. Patchy BILATERAL airspace infiltrates consistent with multifocal pneumonia, minimally improved. No pleural effusion or pneumothorax. IMPRESSION: Minimally improved pulmonary infiltrates. Electronically Signed   By: Ulyses Southward M.D.   On: 10/13/2019 10:37   DG CHEST PORT 1 VIEW  Result Date: 10/12/2019 CLINICAL DATA:  Intubation EXAM: PORTABLE CHEST 1 VIEW COMPARISON:  Earlier today FINDINGS: New endotracheal tube with tip at the clavicular heads, measuring 3 cm above the carina. Low volume chest with severe airspace disease. No visible effusion or air leak. Stable heart size which is distorted by positioning and volumes. IMPRESSION: 1. Unremarkable positioning of the endotracheal tube. 2. Stable extensive airspace disease with low lung volumes. Electronically Signed   By: Marnee Spring M.D.   On: 10/12/2019 07:13   DG CHEST PORT 1 VIEW  Result Date: 10/12/2019 CLINICAL DATA:  Shortness of breath EXAM: PORTABLE CHEST 1 VIEW COMPARISON:  Three days ago FINDINGS: Very low volume chest with diffuse and patchy pulmonary opacity. Vascular pedicle widening and prominent heart size largely related to technique. No visible effusion or pneumothorax. IMPRESSION: Worsening multifocal pneumonia.  Lung volumes remain very low. Electronically Signed   By: Marnee Spring M.D.   On: 10/12/2019 04:07   DG Chest Portable 1 View  Result Date: 10/09/2019 CLINICAL DATA:  Cough, fever, shortness of breath EXAM: PORTABLE CHEST 1 VIEW COMPARISON:  None. FINDINGS: Single frontal view of the chest  demonstrates unremarkable cardiac silhouette. The lung volumes are diminished, with patchy bilateral areas of faint ground-glass airspace disease consistent with multifocal pneumonia. No effusion or pneumothorax. IMPRESSION: 1. Patchy bilateral ground-glass airspace disease consistent with multifocal pneumonia. Electronically Signed   By: Sharlet Salina M.D.   On: 10/09/2019 19:32   DG Abd Portable 1V  Result Date: 10/20/2019 CLINICAL DATA:  GI problem. EXAM: PORTABLE ABDOMEN - 1 VIEW COMPARISON:  10/16/2019 FINDINGS: Feeding tube tip is in the distal stomach or proximal duodenum. Mild gaseous distention of the stomach. Gas throughout nondistended large and small bowel. No evidence of bowel obstruction, organomegaly or free air. IMPRESSION: Feeding tube tip in the distal stomach or proximal duodenum. No evidence of obstruction or free air. Electronically Signed   By: Charlett Nose M.D.   On: 10/20/2019 21:28   DG Abd Portable 1V  Result Date: 10/16/2019 CLINICAL DATA:  Status post OG tube placement EXAM: PORTABLE ABDOMEN - 1 VIEW COMPARISON:  10/16/2019 FINDINGS: The enteric tube tip projects over the distal body of stomach in the side port is below the level of the GE junction. A few air-filled  loops of small bowel are noted within the upper abdomen which measure up to 2.8 cm. IMPRESSION: Satisfactory position of enteric tube. Electronically Signed   By: Signa Kell M.D.   On: 10/16/2019 06:52   DG Abd Portable 1V  Result Date: 10/12/2019 CLINICAL DATA:  Onychogryphosis EXAM: PORTABLE ABDOMEN - 1 VIEW COMPARISON:  Portable exam 0830 hours without priors for comparison FINDINGS: Tip of nasogastric tube projects over distal antrum. Nonobstructive bowel gas pattern. No bowel dilatation or bowel wall thickening. Patchy bibasilar infiltrates. No acute osseous findings. IMPRESSION: Tip of nasogastric tube projects over distal gastric antrum. Nonobstructive bowel gas pattern. Bibasilar pulmonary infiltrates.  Electronically Signed   By: Ulyses Southward M.D.   On: 10/12/2019 08:51   DG Foot 2 Views Left  Result Date: 10/23/2019 CLINICAL DATA:  Bacteremia, LEFT foot pain and diabetic. EXAM: LEFT FOOT - 2 VIEW COMPARISON:  10/13/2019 and prior radiographs FINDINGS: Mildly angulated fractures of the 4th and 5th metatarsals are again noted with fracture lines still evident. Healing changes along these fractures are noted. Overlying soft tissue swelling is noted. No new fracture, subluxation or dislocation noted. No definite radiographic evidence of acute osteomyelitis noted. IMPRESSION: Mildly angulated healing fractures of the 4th and 5th metatarsals with soft tissue swelling again noted but without definite radiographic evidence of acute osteomyelitis. Consider MRI for further evaluation as clinically indicated. Electronically Signed   By: Harmon Pier M.D.   On: 10/23/2019 13:40   DG Foot 2 Views Left  Result Date: 10/13/2019 CLINICAL DATA:  49 year old male with diabetic foot. EXAM: LEFT FOOT - 2 VIEW COMPARISON:  Left foot radiograph dated 09/11/2019. FINDINGS: Evaluation is limited due to positioning. Fractures of the midportion of the fourth and fifth metacarpal with interval progression of bridging callus formation. No new fracture identified. There is no dislocation. There is diffuse soft tissue swelling of the foot. No radiopaque foreign object or soft tissue gas. IMPRESSION: 1. Healing fractures of the fourth and fifth metacarpal. 2. Diffuse soft tissue swelling. No radiopaque foreign object or soft tissue gas. Electronically Signed   By: Elgie Collard M.D.   On: 10/13/2019 16:49   EEG adult  Result Date: 10/27/2019 Charlsie Quest, MD     10/28/2019 10:13 AM Patient Name: CHARLETON DEYOUNG MRN: [id number] Epilepsy Attending: Charlsie Quest Referring Physician/Provider: Dr. [name] Date: 10/27/2019 Duration: 22.22 minutes Patient history: 49 year old male with altered mental status. EEG evaluate for  seizures. Level of alertness: Awake, asleep AEDs during EEG study: Keppra Technical aspects: This EEG study was done with scalp electrodes positioned according to the 10-20 International system of electrode placement. Electrical activity was acquired at a sampling rate of 500Hz  and reviewed with a high frequency filter of 70Hz  and a low frequency filter of 1Hz . EEG data were recorded continuously and digitally stored. Description: The posterior dominant rhythm consists of 8 Hz activity of moderate voltage (25-35 uV) seen predominantly in posterior head regions, symmetric and reactive to eye opening and eye closing. Sleep was characterized by vertex waves, sleep spindles (12 to 14 Hz), maximal frontocentral region. Frequent runs of sharp waves were seen in frontocentral region, maximal vertex lasting about 5 to 6 seconds consistent with brief ictal-interictal rhythmic discharges. Continuous generalized 5 to 6 Hz theta slowing was also noted. Hyperventilation and photic stimulation were not performed.   ABNORMALITY -Continuous slow, generalized -Brief ictal-interictal rhythmic discharges, bilateral frontocentral region, maximal vertex IMPRESSION: This study showed evidence of potential epileptogenicity arising from bilateral frontocentral,  maximal vertex region. The sharp waves are at times rhythmic consistent with brief ictal-interictal rhythmic discharges. Additionally, there is evidence of mild diffuse encephalopathy, nonspecific etiology. Priyanka [name]   Overnight EEG with video  Result Date: 10/28/2019 Charlsie Quest, MD     10/29/2019  8:27 AM Patient Name: DALTEN AMBROSINO MRN: [id number] Epilepsy Attending: Charlsie Quest Referring Physician/Provider: Dr. [name] Duration: 10/27/2019 1145 to 10/28/2019 1145  Patient history: 49 year old male with altered mental status. EEG evaluate for seizures.  Level of alertness: Awake, asleep AEDs during EEG study: Keppra  Technical aspects: This EEG study  was done with scalp electrodes positioned according to the 10-20 International system of electrode placement. Electrical activity was acquired at a sampling rate of 500Hz  and reviewed with a high frequency filter of 70Hz  and a low frequency filter of 1Hz . EEG data were recorded continuously and digitally stored.  Description: The posterior dominant rhythm consists of 8 Hz activity of moderate voltage (25-35 uV) seen predominantly in posterior head regions, symmetric and reactive to eye opening and eye closing. Sleep was characterized by vertex waves, sleep spindles (12 to 14 Hz), maximal frontocentral region. Initially, frequent runs of sharp waves were seen in frontocentral region, maximal vertex lasting about 5 to 6 seconds consistent with brief ictal-interictal rhythmic discharges. Continuous generalized 5 to 6 Hz theta slowing as well as intermittent generalized rhythmic 2-3hz  delta slowing  was also noted. Hyperventilation and photic stimulation were not performed.    ABNORMALITY -Continuous slow, generalized - Intermittent rhythmic delta slowing, generalized -Brief ictal-interictal rhythmic discharges, bilateral frontocentral region, maximal vertex  IMPRESSION: This study initially showed evidence of potential epileptogenicity arising from bilateral frontocentral, maximal vertex region. The sharp waves are at times rhythmic consistent with brief ictal-interictal rhythmic discharges. Additionally, there is evidence of mild diffuse encephalopathy, nonspecific etiology. No seizures were seen during thi study. EEG appears to be improving compared to previous day.  Charlsie Quest   ECHOCARDIOGRAM COMPLETE  Result Date: 10/12/2019    ECHOCARDIOGRAM REPORT   Patient Name:   VONG GARRINGER Date of Exam: 10/12/2019 Medical Rec #:  [id number]      Height:       74.0 in Accession #:    [id number]     Weight:       245.0 lb Date of Birth:  March 08, 1970      BSA:          2.369 m Patient Age:    49 years       BP:            111/74 mmHg Patient Gender: M              HR:           75 bpm. Exam Location:  Inpatient Procedure: 2D Echo Indications:    CHF 428                  & Bacteremia 790.7  History:        Patient has no prior history of Echocardiogram examinations.                 CHF; Risk Factors:Hypertension and Diabetes. Covid +.  Sonographer:    Celene Skeen RDCS (AE) Referring Phys: [id number] Clarene [facility]  Sonographer Comments: Echo performed with patient supine and on artificial respirator. Image acquisition challenging due to respiratory motion and Image acquisition challenging due to patient body habitus. restricted mobility IMPRESSIONS  1. Extremely poor  acoustic windows. LVEF appears to be depressed with diffuse hypokinesis, worse in the mid/distal inferior/inferoseptal walls. . Left ventricular ejection fraction, by estimation, is 30 to 35%. The left ventricle has moderately decreased function. The left ventricular internal cavity size was mildly dilated. Left ventricular diastolic parameters are indeterminate.  2. Right ventricular systolic function is moderately reduced. The right ventricular size is mildly enlarged.  3. The mitral valve is normal in structure. Trivial mitral valve regurgitation.  4. The aortic valve is normal in structure. Aortic valve regurgitation is not visualized. FINDINGS  Left Ventricle: Extremely poor acoustic windows. LVEF appears to be depressed with diffuse hypokinesis, worse in the mid/distal inferior/inferoseptal walls. Left ventricular ejection fraction, by estimation, is 30 to 35%. The left ventricle has moderately decreased function. The left ventricle demonstrates regional wall motion abnormalities. The left ventricular internal cavity size was mildly dilated. There is no left ventricular hypertrophy. Left ventricular diastolic parameters are indeterminate. Right Ventricle: The right ventricular size is mildly enlarged. Right vetricular wall thickness was not assessed. Right  ventricular systolic function is moderately reduced. Left Atrium: Left atrial size was normal in size. Right Atrium: Right atrial size was normal in size. Pericardium: There is no evidence of pericardial effusion. Mitral Valve: The mitral valve is normal in structure. Trivial mitral valve regurgitation. Tricuspid Valve: The tricuspid valve is normal in structure. Tricuspid valve regurgitation is trivial. Aortic Valve: The aortic valve is normal in structure. Aortic valve regurgitation is not visualized. Pulmonic Valve: The pulmonic valve was grossly normal. Pulmonic valve regurgitation is trivial. Aorta: The aortic root is normal in size and structure. IAS/Shunts: The interatrial septum was not assessed.  LEFT VENTRICLE PLAX 2D LVIDd:         5.60 cm  Diastology LVIDs:         3.90 cm  LV e' lateral:   8.81 cm/s LV PW:         1.40 cm  LV E/e' lateral: 5.7 LV IVS:        1.10 cm  LV e' medial:    5.87 cm/s LVOT diam:     2.40 cm  LV E/e' medial:  8.5 LV SV:         64 LV SV Index:   27 LVOT Area:     4.52 cm  RIGHT VENTRICLE RV S prime:     9.79 cm/s TAPSE (M-mode): 1.3 cm LEFT ATRIUM             Index       RIGHT ATRIUM           Index LA diam:        3.10 cm 1.31 cm/m  RA Area:     18.80 cm LA Vol (A2C):   62.9 ml 26.55 ml/m RA Volume:   51.80 ml  21.86 ml/m LA Vol (A4C):   40.8 ml 17.22 ml/m LA Biplane Vol: 50.8 ml 21.44 ml/m  AORTIC VALVE LVOT Vmax:   69.80 cm/s LVOT Vmean:  49.400 cm/s LVOT VTI:    0.142 m  AORTA Ao Root diam: 3.60 cm MITRAL VALVE MV Area (PHT): 2.34 cm    SHUNTS MV Decel Time: 324 msec    Systemic VTI:  0.14 m MV E velocity: 50.10 cm/s  Systemic Diam: 2.40 cm MV A velocity: 71.60 cm/s MV E/A ratio:  0.70 Dietrich Pates MD Electronically signed by Dietrich Pates MD Signature Date/Time: 10/12/2019/1:58:41 PM    Final    ECHO TEE  Result Date: 10/24/2019  TRANSESOPHOGEAL ECHO REPORT   Patient Name:   [name] Date of Exam: 10/24/2019 Medical Rec #:  [id number]      Height:       74.0 in  Accession #:    [id number]     Weight:       247.4 lb Date of Birth:  03/19/1970      BSA:          2.379 m Patient Age:    49 years       BP:           120/80 mmHg Patient Gender: M              HR:           100 bpm. Exam Location:  Inpatient Procedure: Transesophageal Echo Indications:    Emobli/Bacteremia  History:        Patient has prior history of Echocardiogram examinations.  Sonographer:    Ross Ludwig RDCS (AE) Referring Phys: [phone] HAO MENG PROCEDURE: The transesophogeal probe was passed without difficulty through the esophogus of the patient. Sedation performed by different physician. The patient's vital signs; including heart rate, blood pressure, and oxygen saturation; remained stable throughout the procedure. The patient developed no complications during the procedure. IMPRESSIONS  1. Left ventricular ejection fraction, by estimation, is 50 to 55%. The left ventricle has low normal function. The left ventricle has no regional wall motion abnormalities.  2. Right ventricular systolic function is normal. The right ventricular size is normal.  3. No left atrial/left atrial appendage thrombus was detected.  4. The mitral valve is normal in structure. Trivial mitral valve regurgitation. No evidence of mitral stenosis.  5. The aortic valve is normal in structure. Aortic valve regurgitation is not visualized. No aortic stenosis is present.  6. The inferior vena cava is normal in size with greater than 50% respiratory variability, suggesting right atrial pressure of 3 mmHg. Conclusion(s)/Recommendation(s): Normal biventricular function without evidence of hemodynamically significant valvular heart disease. FINDINGS  Left Ventricle: Left ventricular ejection fraction, by estimation, is 50 to 55%. The left ventricle has low normal function. The left ventricle has no regional wall motion abnormalities. The left ventricular internal cavity size was normal in size. There is no left ventricular hypertrophy. Right  Ventricle: The right ventricular size is normal. No increase in right ventricular wall thickness. Right ventricular systolic function is normal. Left Atrium: Left atrial size was normal in size. No left atrial/left atrial appendage thrombus was detected. Right Atrium: Right atrial size was normal in size. Pericardium: There is no evidence of pericardial effusion. Mitral Valve: The mitral valve is normal in structure. Normal mobility of the mitral valve leaflets. Trivial mitral valve regurgitation. No evidence of mitral valve stenosis. There is no evidence of mitral valve vegetation. Tricuspid Valve: The tricuspid valve is normal in structure. Tricuspid valve regurgitation is mild . No evidence of tricuspid stenosis. There is no evidence of tricuspid valve vegetation. Aortic Valve: The aortic valve is normal in structure. Aortic valve regurgitation is not visualized. No aortic stenosis is present. There is no evidence of aortic valve vegetation. Pulmonic Valve: The pulmonic valve was normal in structure. Pulmonic valve regurgitation is trivial. No evidence of pulmonic stenosis. Aorta: The aortic root is normal in size and structure. Venous: The inferior vena cava is normal in size with greater than 50% respiratory variability, suggesting right atrial pressure of 3 mmHg. IAS/Shunts: No atrial level shunt detected by color flow Doppler. [name] MD  Electronically signed by [name] MD Signature Date/Time: 10/24/2019/3:28:45 PM    Final    VAS US CAROTID (at [facility] and [facility] only)  Result Date: 10/26/2019 Carotid Arterial Duplex Study Indications:       17mm focus of diffusion restriction in the left                    frontoparietal lobe concerning for acute/early subacute                    infarct vs cerebritis. Risk Factors:      Hypertension, Diabetes. Other Factors:     Covid-19, bacteremia. Comparison Study:  No prior study on file Performing Technologist: Sherren Kerns RVS  Examination Guidelines: A complete  evaluation includes B-mode imaging, spectral Doppler, color Doppler, and power Doppler as needed of all accessible portions of each vessel. Bilateral testing is considered an integral part of a complete examination. Limited examinations for reoccurring indications may be performed as noted.  Right Carotid Findings: +----------+--------+--------+--------+------------------+--------+           PSV cm/sEDV cm/sStenosisPlaque DescriptionComments +----------+--------+--------+--------+------------------+--------+ CCA Prox  66      11                                         +----------+--------+--------+--------+------------------+--------+ CCA Distal71      18                                         +----------+--------+--------+--------+------------------+--------+ ICA Prox  58      19                                         +----------+--------+--------+--------+------------------+--------+ ICA Distal90      33                                         +----------+--------+--------+--------+------------------+--------+ ECA       114     16                                         +----------+--------+--------+--------+------------------+--------+ +----------+--------+-------+--------+-------------------+           PSV cm/sEDV cmsDescribeArm Pressure (mmHG) +----------+--------+-------+--------+-------------------+ [id number]                                        +----------+--------+-------+--------+-------------------+ +---------+--------+--+--------+--+ VertebralPSV cm/s38EDV cm/s11 +---------+--------+--+--------+--+  Left Carotid Findings: +----------+--------+--------+--------+------------------+--------+           PSV cm/sEDV cm/sStenosisPlaque DescriptionComments +----------+--------+--------+--------+------------------+--------+ CCA Prox  55      14                                          +----------+--------+--------+--------+------------------+--------+ CCA Distal65      20                                         +----------+--------+--------+--------+------------------+--------+  ICA Prox  68      20                                         +----------+--------+--------+--------+------------------+--------+ ICA Distal76      23                                         +----------+--------+--------+--------+------------------+--------+ ECA       77      3                                          +----------+--------+--------+--------+------------------+--------+ +----------+--------+--------+--------+-------------------+           PSV cm/sEDV cm/sDescribeArm Pressure (mmHG) +----------+--------+--------+--------+-------------------+ [id number]                                          +----------+--------+--------+--------+-------------------+ +---------+--------+--+--------+--+ VertebralPSV cm/s63EDV cm/s18 +---------+--------+--+--------+--+   Summary: Right Carotid: The extracranial vessels were near-normal with only minimal wall                thickening or plaque. Left Carotid: The extracranial vessels were near-normal with only minimal wall               thickening or plaque. Vertebrals:  Bilateral vertebral arteries demonstrate antegrade flow. Subclavians: Normal flow hemodynamics were seen in bilateral subclavian              arteries. *See table(s) above for measurements and observations.  Electronically signed by Delia Heady MD on 10/26/2019 at 12:37:48 PM.    Final    VAS [place] LOWER EXTREMITY ARTERIAL DUPLEX  Result Date: 10/26/2019 LOWER EXTREMITY ARTERIAL DUPLEX STUDY Indications: Ulceration, and Osteomyelitis. High Risk Factors: Hypertension, Diabetes. Other Factors: Covid-19. Charcot foot.  Current ABI: N/A Comparison Study: No prior study Performing Technologist: Sherren Kerns RVS  Examination Guidelines: A complete evaluation includes  B-mode imaging, spectral Doppler, color Doppler, and power Doppler as needed of all accessible portions of each vessel. Bilateral testing is considered an integral part of a complete examination. Limited examinations for reoccurring indications may be performed as noted.  +----------+--------+-----+--------+---------+--------+ LEFT      PSV cm/sRatioStenosisWaveform Comments +----------+--------+-----+--------+---------+--------+ CFA Prox  123                  triphasic         +----------+--------+-----+--------+---------+--------+ DFA       51                   triphasic         +----------+--------+-----+--------+---------+--------+ SFA Prox  97                   triphasic         +----------+--------+-----+--------+---------+--------+ SFA Mid   88                   triphasic         +----------+--------+-----+--------+---------+--------+ SFA Distal81                   triphasic         +----------+--------+-----+--------+---------+--------+  POP Prox  60                   triphasic         +----------+--------+-----+--------+---------+--------+ POP Distal86                   triphasic         +----------+--------+-----+--------+---------+--------+ ATA Distal110                  triphasic         +----------+--------+-----+--------+---------+--------+ PTA Prox  34                   triphasic         +----------+--------+-----+--------+---------+--------+ PTA Mid   29                   triphasic         +----------+--------+-----+--------+---------+--------+ PTA [id number]                  triphasic         +----------+--------+-----+--------+---------+--------+  Summary: Left: Normal waveforms and adequate flow noted.  See table(s) above for measurements and observations. Electronically signed by Lemar Livings MD on 10/26/2019 at 4:50:14 PM.    Final    VAS [place] LOWER EXTREMITY ARTERIAL DUPLEX  Result Date: 10/19/2019 LOWER EXTREMITY  ARTERIAL DUPLEX STUDY Indications: Cold leg, Covid-19.  Current ABI: N/A Comparison Study: No prior study Performing Technologist: Sherren Kerns RVS  Examination Guidelines: A complete evaluation includes B-mode imaging, spectral Doppler, color Doppler, and power Doppler as needed of all accessible portions of each vessel. Bilateral testing is considered an integral part of a complete examination. Limited examinations for reoccurring indications may be performed as noted.  +-----------+--------+-----+--------+---------+--------+ RIGHT      PSV cm/sRatioStenosisWaveform Comments +-----------+--------+-----+--------+---------+--------+ CFA Prox   67                   triphasic         +-----------+--------+-----+--------+---------+--------+ DFA        60                   triphasic         +-----------+--------+-----+--------+---------+--------+ SFA Prox   68                   triphasic         +-----------+--------+-----+--------+---------+--------+ SFA Mid    62                   triphasic         +-----------+--------+-----+--------+---------+--------+ SFA Distal 69                   triphasic         +-----------+--------+-----+--------+---------+--------+ POP Prox   47                   triphasic         +-----------+--------+-----+--------+---------+--------+ POP Distal 42                   triphasic         +-----------+--------+-----+--------+---------+--------+ ATA Distal 15                   triphasic         +-----------+--------+-----+--------+---------+--------+ PTA Prox   31                   triphasic         +-----------+--------+-----+--------+---------+--------+  PTA Mid    38                   triphasic         +-----------+--------+-----+--------+---------+--------+ PTA Distal 38                   triphasic         +-----------+--------+-----+--------+---------+--------+ PERO Distal36                    triphasic         +-----------+--------+-----+--------+---------+--------+  Summary: Right: Normal examination. No evidence of arterial occlusive disease. Normal waveforms, no stenosis.  See table(s) above for measurements and observations. Electronically signed by Sherald Hess MD on 10/19/2019 at 3:25:02 PM.    Final    VAS [place] LOWER EXTREMITY VENOUS (DVT)  Result Date: 10/26/2019  Lower Venous DVTStudy Indications: Covid-19, elevated D-Dimer.  Comparison Study: Prior negative left lower extremity venous duplex don 03/19/17                   and is available for comparison. Performing Technologist: Sherren Kerns RVS  Examination Guidelines: A complete evaluation includes B-mode imaging, spectral Doppler, color Doppler, and power Doppler as needed of all accessible portions of each vessel. Bilateral testing is considered an integral part of a complete examination. Limited examinations for reoccurring indications may be performed as noted. The reflux portion of the exam is performed with the patient in reverse Trendelenburg.  +---------+---------------+---------+-----------+----------+--------------+ RIGHT    CompressibilityPhasicitySpontaneityPropertiesThrombus Aging +---------+---------------+---------+-----------+----------+--------------+ CFV      Full           Yes      Yes                                 +---------+---------------+---------+-----------+----------+--------------+ SFJ      Full                                                        +---------+---------------+---------+-----------+----------+--------------+ FV Prox  Full                                                        +---------+---------------+---------+-----------+----------+--------------+ FV Mid   Full                                                        +---------+---------------+---------+-----------+----------+--------------+ FV DistalFull                                                         +---------+---------------+---------+-----------+----------+--------------+ PFV      Full                                                        +---------+---------------+---------+-----------+----------+--------------+  POP      Partial        Yes      Yes                  Acute          +---------+---------------+---------+-----------+----------+--------------+ PTV      Full                                                        +---------+---------------+---------+-----------+----------+--------------+ PERO     Full                                                        +---------+---------------+---------+-----------+----------+--------------+   +---------+---------------+---------+-----------+----------+--------------+ LEFT     CompressibilityPhasicitySpontaneityPropertiesThrombus Aging +---------+---------------+---------+-----------+----------+--------------+ CFV      Full           Yes      Yes                                 +---------+---------------+---------+-----------+----------+--------------+ SFJ      Full                                                        +---------+---------------+---------+-----------+----------+--------------+ FV Prox  Full                                                        +---------+---------------+---------+-----------+----------+--------------+ FV Mid   Full                                                        +---------+---------------+---------+-----------+----------+--------------+ FV DistalFull                                                        +---------+---------------+---------+-----------+----------+--------------+ PFV      Full                                                        +---------+---------------+---------+-----------+----------+--------------+ POP      Full           Yes      Yes                                  +---------+---------------+---------+-----------+----------+--------------+  PTV      Full                                                        +---------+---------------+---------+-----------+----------+--------------+ PERO     Full                                                        +---------+---------------+---------+-----------+----------+--------------+     Summary: RIGHT: - Findings consistent with acute deep vein thrombosis involving the right popliteal vein. - Ultrasound characteristics of enlarged lymph nodes are noted in the groin.  LEFT: - There is no evidence of deep vein thrombosis in the lower extremity.  *See table(s) above for measurements and observations. Electronically signed by Lemar Livings MD on 10/26/2019 at 4:50:27 PM.    Final    [place] EKG SITE RITE  Result Date: 10/12/2019 If Site Rite image not attached, placement could not be confirmed due to current cardiac rhythm.

## 2019-11-01 NOTE — Progress Notes (Signed)
Physical Therapy Treatment Patient Details Name: Micheal Bell MRN: [id number] DOB: 1970-02-28 Today's Date: 11/01/2019    History of Present Illness Pt is a 49 y.o. male admitted 10/09/19 for COVID PNA. Pt decompensated 8/19 and intubated; self extubated 8/23 and reintubated; extubated 8/28. Course complicated by MSSA and GBS bacteria, sepsis, systolic HF, acute metabolic encephalopathy. 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 TEE without evidence of vegitation. 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    PT Comments    Pt continues to make good progress. Able to take some steps with walker.    Follow Up Recommendations  CIR;Supervision/Assistance - 24 hour     Equipment Recommendations  Other (comment) (To be determined)    Recommendations for Other Services       Precautions / Restrictions Precautions Precautions: Fall;Other (comment) Precaution Comments: wounds on feet    Mobility  Bed Mobility Overal bed mobility: Needs Assistance Bed Mobility: Rolling;Sidelying to Sit Rolling: Min guard Sidelying to sit: Mod assist       General bed mobility comments: Assist to elevate trunk into sitting. Verbal cues for technique  Transfers Overall transfer level: Needs assistance Equipment used: Rolling walker (2 wheeled) Transfers: Sit to/from [employer] Sit to Stand: Mod assist;From elevated surface Stand pivot transfers: Mod assist       General transfer comment: Assist to bring hips up and for balance. Stood x 2 from bed. On second stand then took pivotal steps from bed to chair with walker. Unable to stand from chair with walker with one person assist due to height of  chair  Ambulation/Gait Ambulation/Gait assistance: Mod assist Gait Distance (Feet): 2 Feet Assistive device: Rolling walker (2 wheeled) Gait Pattern/deviations: Step-to pattern;Decreased step length - right;Decreased step length - left Gait velocity: decr Gait velocity interpretation: <1.31 ft/sec, indicative of household ambulator General Gait Details: Side stepped up the side of bed    Stairs             Wheelchair Mobility    Modified Rankin (Stroke Patients Only) Modified Rankin (Stroke Patients Only) Pre-Morbid Rankin Score: No symptoms Modified Rankin: Severe disability     Balance Overall balance assessment: Needs assistance Sitting-balance support: Bilateral upper extremity supported;Feet supported Sitting balance-Leahy Scale: Poor Sitting balance - Comments: Sat EOB with UE support and min guard assist.    Standing balance support: During functional activity;Bilateral upper extremity supported Standing balance-Leahy Scale: Poor Standing balance comment: walker and min assist for static standing                            Cognition Arousal/Alertness: Awake/alert Behavior During Therapy: WFL for tasks assessed/performed;Flat affect Overall Cognitive Status: Impaired/Different from baseline Area of Impairment: Orientation;Attention;Memory;Following commands;Safety/judgement;Awareness;Problem solving                 Orientation Level: Disoriented to;Situation Current Attention Level: Sustained Memory: Decreased short-term memory Following Commands: Follows one step commands with increased time Safety/Judgement: Decreased awareness of safety;Decreased awareness of deficits Awareness: Intellectual Problem Solving: Slow processing;Decreased initiation;Requires tactile cues General Comments: Pt said multiple times during session that he could just get up and walk and go home. Pointed out to him that he was just starting to stand with the walker.  Exercises      General Comments        Pertinent Vitals/Pain Pain Assessment: Faces Faces Pain Scale: No hurt    Home Living                      Prior Function            PT Goals (current goals can now be found in the care plan section) Acute Rehab PT Goals Patient Stated Goal: be able to use toilet Progress towards PT goals: Progressing toward goals    Frequency    Min 4X/week      PT Plan Current plan remains appropriate    Co-evaluation              AM-PAC PT "6 Clicks" Mobility   Outcome Measure  Help needed turning from your back to your side while in a flat bed without using bedrails?: A Little Help needed moving from lying on your back to sitting on the side of a flat bed without using bedrails?: A Lot Help needed moving to and from a bed to a chair (including a wheelchair)?: A Lot Help needed standing up from a chair using your arms (e.g., wheelchair or bedside chair)?: A Lot Help needed to walk in hospital room?: Total Help needed climbing 3-5 steps with a railing? : Total 6 Click Score: 11    End of Session Equipment Utilized During Treatment: Gait belt Activity Tolerance: Patient tolerated treatment well Patient left: with call bell/phone within reach;in chair;with chair alarm set Nurse Communication: Mobility status;Need for lift equipment PT Visit Diagnosis: Other abnormalities of gait and mobility (R26.89);Muscle weakness (generalized) (M62.81);Other symptoms and signs involving the nervous system (N23.557)     Time: 3220-2542 PT Time Calculation (min) (ACUTE ONLY): 36 min  Charges:  $Therapeutic Activity: 23-37 mins                     [facility] PT Acute Rehabilitation Services Pager [phone] Office [phone]    Angelina Ok [facility] 11/01/2019, 12:25 PM

## 2019-11-01 NOTE — Progress Notes (Signed)
IP rehab admissions - Continuing to follow for potential CIR admission.  I have no bed available today for this patient.  Will follow up again tomorrow for bed availability.  Call me for questions.  [phone]

## 2019-11-01 NOTE — Plan of Care (Signed)
  Problem: Education: Goal: Knowledge of risk factors and measures for prevention of condition will improve Outcome: Progressing   Problem: Respiratory: Goal: Will maintain a patent airway Outcome: Progressing   Problem: Activity: Goal: Risk for activity intolerance will decrease Outcome: Progressing   Problem: Coping: Goal: Level of anxiety will decrease Outcome: Progressing   

## 2019-11-02 LAB — CBC WITH DIFFERENTIAL/PLATELET
Abs Immature Granulocytes: 0.08 10*3/uL — ABNORMAL HIGH (ref 0.00–0.07)
Basophils Absolute: 0 10*3/uL (ref 0.0–0.1)
Basophils Relative: 1 %
Eosinophils Absolute: 0.4 10*3/uL (ref 0.0–0.5)
Eosinophils Relative: 5 %
HCT: 30.5 % — ABNORMAL LOW (ref 39.0–52.0)
Hemoglobin: 9.7 g/dL — ABNORMAL LOW (ref 13.0–17.0)
Immature Granulocytes: 1 %
Lymphocytes Relative: 32 %
Lymphs Abs: 2.6 10*3/uL (ref 0.7–4.0)
MCH: 29.8 pg (ref 26.0–34.0)
MCHC: 31.8 g/dL (ref 30.0–36.0)
MCV: 93.8 fL (ref 80.0–100.0)
Monocytes Absolute: 0.6 10*3/uL (ref 0.1–1.0)
Monocytes Relative: 8 %
Neutro Abs: 4.4 10*3/uL (ref 1.7–7.7)
Neutrophils Relative %: 53 %
Platelets: 322 10*3/uL (ref 150–400)
RBC: 3.25 MIL/uL — ABNORMAL LOW (ref 4.22–5.81)
RDW: 12.9 % (ref 11.5–15.5)
WBC: 8 10*3/uL (ref 4.0–10.5)
nRBC: 0 % (ref 0.0–0.2)

## 2019-11-02 LAB — GLUCOSE, CAPILLARY
Glucose-Capillary: 136 mg/dL — ABNORMAL HIGH (ref 70–99)
Glucose-Capillary: 147 mg/dL — ABNORMAL HIGH (ref 70–99)
Glucose-Capillary: 154 mg/dL — ABNORMAL HIGH (ref 70–99)
Glucose-Capillary: 166 mg/dL — ABNORMAL HIGH (ref 70–99)
Glucose-Capillary: 185 mg/dL — ABNORMAL HIGH (ref 70–99)
Glucose-Capillary: 188 mg/dL — ABNORMAL HIGH (ref 70–99)
Glucose-Capillary: 192 mg/dL — ABNORMAL HIGH (ref 70–99)

## 2019-11-02 LAB — COMPREHENSIVE METABOLIC PANEL
ALT: 17 U/L (ref 0–44)
AST: 17 U/L (ref 15–41)
Albumin: 2.1 g/dL — ABNORMAL LOW (ref 3.5–5.0)
Alkaline Phosphatase: 92 U/L (ref 38–126)
Anion gap: 12 (ref 5–15)
BUN: 5 mg/dL — ABNORMAL LOW (ref 6–20)
CO2: 25 mmol/L (ref 22–32)
Calcium: 8.7 mg/dL — ABNORMAL LOW (ref 8.9–10.3)
Chloride: 98 mmol/L (ref 98–111)
Creatinine, Ser: 0.74 mg/dL (ref 0.61–1.24)
GFR calc Af Amer: >60 mL/min (ref >60)
GFR calc non Af Amer: >60 mL/min (ref >60)
Glucose, Bld: 167 mg/dL — ABNORMAL HIGH (ref 70–99)
Potassium: 3.8 mmol/L (ref 3.5–5.1)
Sodium: 135 mmol/L (ref 135–145)
Total Bilirubin: 0.7 mg/dL (ref 0.3–1.2)
Total Protein: 6.5 g/dL (ref 6.5–8.1)

## 2019-11-02 LAB — MAGNESIUM: Magnesium: 1.8 mg/dL (ref 1.7–2.4)

## 2019-11-02 MED ORDER — ENOXAPARIN SODIUM 120 MG/0.8ML ~~LOC~~ SOLN
110.0000 mg | Freq: Two times a day (BID) | SUBCUTANEOUS | Status: DC
  Administered 2019-11-02 – 2019-11-04 (×5): 110 mg via SUBCUTANEOUS
  Filled 2019-11-02 (×6): qty 0.74

## 2019-11-02 MED ORDER — CYCLOBENZAPRINE HCL 5 MG PO TABS
5.0000 mg | ORAL_TABLET | Freq: Three times a day (TID) | ORAL | Status: DC | PRN
  Administered 2019-11-02 – 2019-11-04 (×5): 5 mg via ORAL
  Filled 2019-11-02 (×7): qty 1

## 2019-11-02 MED ORDER — IPRATROPIUM-ALBUTEROL 20-100 MCG/ACT IN AERS
1.0000 | INHALATION_SPRAY | RESPIRATORY_TRACT | Status: DC | PRN
  Administered 2019-11-02: 1 via RESPIRATORY_TRACT
  Filled 2019-11-02: qty 4

## 2019-11-02 NOTE — TOC Progression Note (Signed)
Transition of Care [facility]) - Progression Note    Patient Details  Name: Micheal Bell MRN: [id number] Date of Birth: 1970/09/26  [facility] CM/SW Contact  Lockie Pares, RN Phone Number: 11/02/2019, 4:32 PM  Clinical Narrative:     Received  Amendment to Medicaid by Jeronimo Norma from 4th floor CIR. Copied and given to [facility]. For input into system.    Expected Discharge Plan: Skilled Nursing Facility Barriers to Discharge: Continued Medical Work up  Expected Discharge Plan and Services Expected Discharge Plan: Skilled Nursing Facility In-house Referral: Clinical Social Work Discharge Planning Services: CM Consult                                           Social Determinants of Health (SDOH) Interventions    Readmission Risk Interventions No flowsheet data found.

## 2019-11-02 NOTE — TOC Progression Note (Signed)
Transition of Care [facility]) - Progression Note    Patient Details  Name: Micheal Bell MRN: [id number] Date of Birth: 28-Mar-1970  Transition of Care [facility]) CM/SW Contact  Lockie Pares, RN Phone Number: 11/02/2019, 10:42 AM  Clinical Narrative:    Received a call from jeanie in CIR, She does have the Medicaid approval  letter and will bring down a copy to  Place in place in patients chart.    Expected Discharge Plan: Skilled Nursing Facility Barriers to Discharge: Continued Medical Work up  Expected Discharge Plan and Services Expected Discharge Plan: Skilled Nursing Facility In-house Referral: Clinical Social Work Discharge Planning Services: CM Consult                                           Social Determinants of Health (SDOH) Interventions    Readmission Risk Interventions No flowsheet data found.

## 2019-11-02 NOTE — Plan of Care (Signed)
  Problem: Education: Goal: Knowledge of risk factors and measures for prevention of condition will improve Outcome: Progressing   Problem: Coping: Goal: Psychosocial and spiritual needs will be supported Outcome: Progressing   Problem: Respiratory: Goal: Will maintain a patent airway Outcome: Progressing   Problem: Education: Goal: Knowledge of General Education information will improve Description: Including pain rating scale, medication(s)/side effects and non-pharmacologic comfort measures Outcome: Progressing   Problem: Clinical Measurements: Goal: Respiratory complications will improve Outcome: Progressing   Problem: Activity: Goal: Risk for activity intolerance will decrease Outcome: Progressing   Problem: Coping: Goal: Level of anxiety will decrease Outcome: Progressing   Problem: Pain Managment: Goal: General experience of comfort will improve Outcome: Progressing   Problem: Safety: Goal: Ability to remain free from injury will improve Outcome: Progressing   Problem: Skin Integrity: Goal: Risk for impaired skin integrity will decrease Outcome: Progressing   Problem: Education: Goal: Knowledge of disease or condition will improve Outcome: Progressing Goal: Knowledge of secondary prevention will improve Outcome: Progressing Goal: Knowledge of patient specific risk factors addressed and post discharge goals established will improve Outcome: Progressing Goal: Individualized Educational Video(s) Outcome: Progressing   Problem: Coping: Goal: Will verbalize positive feelings about self Outcome: Progressing Goal: Will identify appropriate support needs Outcome: Progressing   Problem: Health Behavior/Discharge Planning: Goal: Ability to manage health-related needs will improve Outcome: Progressing   Problem: Self-Care: Goal: Ability to participate in self-care as condition permits will improve Outcome: Progressing Goal: Verbalization of feelings and  concerns over difficulty with self-care will improve Outcome: Progressing Goal: Ability to communicate needs accurately will improve Outcome: Progressing   Problem: Nutrition: Goal: Risk of aspiration will decrease Outcome: Progressing Goal: Dietary intake will improve Outcome: Progressing   Problem: Ischemic Stroke/TIA Tissue Perfusion: Goal: Complications of ischemic stroke/TIA will be minimized Outcome: Progressing

## 2019-11-02 NOTE — Progress Notes (Signed)
PROGRESS NOTE                                                                                                                                                                                                             Patient Demographics:    Micheal Bell, is a 49 y.o. male, DOB - [birth year], OIT:[id number]  Outpatient Primary MD for the patient is [name], MD    LOS - 21  Admit date - 10/09/2019    Chief Complaint  Patient presents with  . Cough       Brief Narrative - 49 yo male with hx of HTN, T2DM, gout, IBS, fibromyalgia, obesity who presented on 8/16 for COVID pneumonia.  He decompensated on 8/19 and was intubated.  Self extubated on 8/23 and was reintubated.  He's subsequently been extubated on 8/28.  He's been on typical therapies for covid including steroids, baricitinib and remdesivir.  His hospitalization was complicated by MSSA and GBS bacteria.  Also found to have systolic heart failure.    Subjective:   Patient in bed, appears comfortable, denies any headache, no fever, no chest pain or pressure, no shortness of breath , he does report some chronic back pain requesting Flexeril.    Assessment  & Plan :   Acute Hypoxic Resp. Failure due to Acute Covid 19 Viral Pneumonitis during the ongoing 2020 Covid 19 Pandemic - he had severe hypoxia and parenchymal lung injury he was intubated and admitted by ICU, he was started on IV steroids, remdesivir and Baricitinib on 10/12/2019.  He was subsequently extubated on 10/21/2019.  His stay was complicated by bacteremia after which Baricitinib was discontinued on 10/24/2019.  His pulmonary status is gradually improving, currently he is stable and symptom-free, he is on room air, I have discussed with him about incentive spirometry, flutter valve, pulmonary toiletry,   Recent Labs  Lab 10/27/19 0500 10/27/19 0500 10/28/19 0453 10/28/19 0454 10/29/19 0409  10/29/19 0410 10/30/19 0320 10/31/19 0411 11/01/19 0152 11/02/19 0342  WBC 13.1*   < > 10.7*  --  10.5  --   --  8.3 8.7 8.0  PLT 339   < > 372  --  363  --   --  338 342 322  CRP 9.2*  --  8.8*  --  8.4*  --   --   --   --   --  BNP 54.1  --   --  46.0  --  76.9  --   --   --   --   DDIMER 5.77*  --  5.74*  --  5.18*  --   --   --   --   --   PROCALCITON 0.13  --  <0.10  --  <0.10  --  <0.10  --   --   --   AST 28   < > 24  --  21  --   --  18 19 17   ALT 17   < > 18  --  17  --   --  17 18 17   ALKPHOS 93   < > 92  --  86  --   --  85 89 92  BILITOT 1.0   < > 0.6  --  0.8  --   --  0.7 0.6 0.7  ALBUMIN 2.2*   < > 2.1*  --  2.1*  --   --  2.1* 2.0* 2.1*   < > = values in this interval not displayed.    Sepsis with MSSA and Group B strep bacteremia due to left foot abscess.   -ID following, was on IV Ancef but switch to nafcillin on 10/26/2019.  Clean TEE, history of Charcot joints, peripheral neuropathy and recent left foot soft tissue injury, MRI left foot shows an abscess, Ortho was consulted and they recommended medical management with outpatient follow-up with Dr. [name] post discharge, also has osteomyelitis, defer duration of treatment with antibiotics to ID.  Antibiotics management per ID, plan to continue total of 4 weeks of IV nafcillin, through 9/29, then start Keflex 500 mg 4 times daily, he has a follow-up arranged with Dr. [name] on October 13 at 10:30 AM .  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  For seizures he has been started on Keppra with good effect and his mentation has improved considerably. Continue PT OT and speech, SNF VS CIR .   Neuro following.   Possible systolic heart failure as shown by TTE however this could have been transient due to sepsis, on repeat TEE his EF is now normalized to 50 to 55%.  He is currently compensated, blood pressure was too low and after hydration has improved, continue beta-blocker.   Cyanotic right lower extremity toes.  Likely due to hypoperfusion which is transient, arterial ultrasound stable.  History of chronic pain.  On multiple narcotics, Flexeril and benzodiazepines.  All on hold in the hospital.  HTN - BP was low on multiple blood pressure medications, was hydrated now able to tolerate low-dose beta-blocker.  Elevated D-dimer, positive acute DVT in the right lower extremity.  Switched to full dose Lovenox.   Hypomagnesemia.  Replaced.    DM type II.  Currently on Lantus and sliding scale monitor.  Lab Results  Component Value Date   HGBA1C 11.3 (H) 10/26/2019   CBG (last 3)  Recent Labs    11/02/19 0416 11/02/19 0727 11/02/19 1159  GLUCAP 154* 166* 192*      Condition -   Guarded  Family Communication  :  Sister and mother [phone] - 10/25/19, 10/27/19, 10/28/19  Code Status :  Full  Consults  :  PCCM, ID, Cards  Procedures  :    EEG - This study showed evidence of potential epileptogenicity arising from bilateral frontocentral, maximal vertex region. The sharp waves are at times rhythmic consistent with brief ictal-interictal rhythmic discharges. Additionally, there is evidence of mild diffuse encephalopathy, nonspecific etiology.  Bilateral lower extremity venous ultrasound - Acute right lower extremity popliteal vein DVT.  Carotid duplex.  Nonacute.    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  CT Head -  1. No acute intracranial abnormality. 2. Area of subcortical low-density involving the posterior left frontal lobe at the convexity, likely encephalomalacia  and sequela of remote infarct. There are no prior exams for comparison to establish chronicity. MRI could be considered for further evaluation based on clinical concern. 3. Paranasal sinus fluid levels on opacification of the right greater than left mastoid air cells, possibly related to intubation.  R. Leg Arterial US -  No evidence of arterial occlusive disease. Normal waveforms, no stenosis  TTE -  1. Extremely poor acoustic windows. LVEF appears to be depressed with diffuse hypokinesis, worse in the mid/distal inferior/inferoseptal walls. . Left ventricular ejection fraction, by estimation, is 30 to 35%. The left ventricle has moderately decreased function. The left ventricular internal cavity size was mildly dilated. Left ventricular diastolic parameters are indeterminate.  2. Right ventricular systolic function is moderately reduced. The right ventricular size is mildly enlarged.  3. The mitral valve is normal in structure. Trivial mitral valve regurgitation.  4. The aortic valve is normal in structure. Aortic valve regurgitation is not visualized  TEE - 1. Left ventricular ejection fraction, by estimation, is 50 to 55%. The left ventricle has low normal function. The left ventricle has no regional wall motion abnormalities.  2. Right ventricular systolic function is normal. The right ventricular size is normal.  3. No left atrial/left atrial appendage thrombus was detected.  4. The mitral valve is normal in structure. Trivial mitral valve regurgitation. No evidence of mitral stenosis.  5. The aortic valve is normal in structure. Aortic valve regurgitation is not visualized. No aortic stenosis is present.  6. The inferior vena cava is normal in size with greater than 50% respiratory variability, suggesting right atrial pressure of 3 mmHg.  CT - 1. No acute intracranial abnormality. 2. Area of subcortical low-density involving the posterior left frontal lobe at the convexity, likely encephalomalacia and  sequela of remote infarct. There are no prior exams for comparison to establish chronicity. MRI could be considered for further evaluation based on clinical concern. 3. Paranasal sinus fluid levels on opacification of the right greater than left mastoid air cells, possibly related to intubation    PUD Prophylaxis : PPI  Disposition Plan  :    Status is: Inpatient  Remains inpatient appropriate because:IV treatments appropriate due to intensity of illness or inability to take PO   Dispo: The patient is from: Home              Anticipated d/c is to: Home              Anticipated d/c date is: 1 day              Patient currently is medically stable to d/c. to CIR when bed is available.   DVT Prophylaxis  :  Lovenox    Lab Results  Component Value Date   PLT 322 11/02/2019    Diet :  Diet Order            Diet heart healthy/carb modified Room service appropriate? No; Fluid consistency: Thin  Diet effective now                  Inpatient Medications  Scheduled Meds: . alteplase  2 mg Intracatheter Once  . atorvastatin  40 mg Oral Daily  . buPROPion  150 mg Oral Daily  . Chlorhexidine Gluconate Cloth  6 each Topical Daily  . docusate sodium  100 mg Oral BID  . enoxaparin (LOVENOX) injection  110 mg Subcutaneous Q12H  . feeding supplement (ENSURE ENLIVE)  237 mL Oral TID BM  . insulin aspart  0-15 Units Subcutaneous Q4H  . insulin glargine  20 Units Subcutaneous Daily  . levETIRAcetam  500 mg Oral BID  . linagliptin  5 mg Oral Daily  . melatonin  3 mg Oral QHS  . metoprolol tartrate  50 mg Oral BID  . pantoprazole  40 mg Oral Daily  . polyethylene glycol  17 g Oral Daily  . senna  2 tablet Oral BID  . sodium chloride flush  10-40 mL Intracatheter Q12H   Continuous Infusions: . sodium chloride 10 mL/hr at 11/02/19 0600  . nafcillin (NAFCIL) continuous infusion 12 g (11/02/19 1042)   PRN Meds:.sodium chloride, acetaminophen, bisacodyl, HYDROcodone-acetaminophen,  Ipratropium-Albuterol  Antibiotics  :    Anti-infectives (From admission, onward)   Start     Dose/Rate Route Frequency Ordered Stop   10/31/19 1415  nafcillin 12 g in sodium chloride 0.9 % 500 mL continuous infusion  Status:  Discontinued        12 g 20.8 mL/hr over 24 Hours Intravenous Every 24 hours 10/31/19 0608 10/31/19 0609   10/29/19 1415  nafcillin 12 g in dextrose 5 % 500 mL IVPB  Status:  Discontinued        12 g 20.8 mL/hr over 24 Hours Intravenous Every 24 hours 10/29/19 0434 10/29/19 0507   10/29/19 1415  nafcillin 12 g in sodium chloride 0.9 % 500 mL continuous infusion        12 g 20.8 mL/hr over 24 Hours Intravenous Every 24 hours 10/29/19 0507     10/26/19 1415  nafcillin 12 g in dextrose 5 % 500 mL IVPB  Status:  Discontinued        12 g 20.8 mL/hr over 24 Hours Intravenous Every 24 hours 10/26/19 1414 10/29/19 0434   10/26/19 1400  nafcillin injection 12 g  Status:  Discontinued        12 g Intravenous Daily 10/26/19 1238 10/26/19 1242   10/26/19 1400  nafcillin 12 g in dextrose 5 % 50 mL IVPB  Status:  Discontinued        12 g 100 mL/hr over 30 Minutes Intravenous Every 24 hours 10/26/19 1242 10/26/19 1339   10/26/19 1400  nafcillin 12 g in dextrose 5 % 500 mL IVPB  Status:  Discontinued        12 g 20.8 mL/hr over 24 Hours Intravenous Every 24 hours 10/26/19 1337 10/26/19 1416   10/26/19 1200  nafcillin 2 g in sodium chloride 0.9 % 100 mL IVPB  Status:  Discontinued        2 g 200 mL/hr over 30 Minutes Intravenous Every 4 hours 10/26/19 1010 10/26/19 1238   10/12/19 2200  cefTRIAXone (ROCEPHIN) 2  g in sodium chloride 0.9 % 100 mL IVPB  Status:  Discontinued        2 g 200 mL/hr over 30 Minutes Intravenous Every 24 hours 10/12/19 0441 10/12/19 0921   10/12/19 0930  ceFAZolin (ANCEF) IVPB 2g/100 mL premix  Status:  Discontinued        2 g 200 mL/hr over 30 Minutes Intravenous Every 8 hours 10/12/19 0921 10/26/19 1010   10/11/19 1000  remdesivir 100 mg in sodium  chloride 0.9 % 100 mL IVPB       "Followed by" Linked Group Details   100 mg 200 mL/hr over 30 Minutes Intravenous Daily 10/10/19 0050 10/14/19 0956   10/11/19 0800  vancomycin (VANCOCIN) IVPB 1000 mg/200 mL premix  Status:  Discontinued        1,000 mg 200 mL/hr over 60 Minutes Intravenous Every 8 hours 10/10/19 2239 10/12/19 0921   10/10/19 2230  vancomycin (VANCOCIN) IVPB 1000 mg/200 mL premix        1,000 mg 200 mL/hr over 60 Minutes Intravenous Every 1 hr x 2 10/10/19 2208 10/11/19 0052   10/10/19 2215  cefTRIAXone (ROCEPHIN) 2 g in sodium chloride 0.9 % 100 mL IVPB  Status:  Discontinued        2 g 200 mL/hr over 30 Minutes Intravenous Every 24 hours 10/10/19 2205 10/12/19 0441   10/10/19 0100  remdesivir 100 mg in sodium chloride 0.9 % 100 mL IVPB       "Followed by" Linked Group Details   100 mg 200 mL/hr over 30 Minutes Intravenous Every 30 min 10/10/19 0050 10/10/19 0332        Mliss Fritz Rodderick Holtzer M.D on 11/02/2019 at 3:29 PM  To page go to [email or web address] -  [facility] -  Office  [phone]   See all Orders from today for further details    Objective:   Vitals:   11/02/19 0416 11/02/19 0500 11/02/19 0724 11/02/19 1150  BP: 120/73  127/73 127/75  Pulse: 76 67 80 76  Resp: 19  20 17   Temp: 98 F (36.7 C)  98 F (36.7 C) 98.4 F (36.9 C)  TempSrc: Oral  Oral Oral  SpO2: 93% (!) 86% 99% 96%  Weight: 112.6 kg     Height:        Wt Readings from Last 3 Encounters:  11/02/19 112.6 kg  09/11/19 111.1 kg  05/12/17 (!) 145.2 kg     Intake/Output Summary (Last 24 hours) at 11/02/2019 1529 Last data filed at 11/02/2019 0900 Gross per 24 hour  Intake 2208.31 ml  Output 2500 ml  Net -291.69 ml     Physical Exam   chronic right-sided weakness, deltoid weakness in both extremities,  Awake Alert, Oriented X 3, No new F.N deficits, chronic right-sided weakness, and deltoid weakness in both extremities Symmetrical Chest wall movement, Good air movement  bilaterally, CTAB RRR,No Gallops,Rubs or new Murmurs, No Parasternal Heave +ve B.Sounds, Abd Soft, No tenderness, No rebound - guarding or rigidity. Mild cyanosis of the right toes, both ankles under bandage,    Data Review:    CBC Recent Labs  Lab 10/28/19 0453 10/29/19 0409 10/31/19 0411 11/01/19 0152 11/02/19 0342  WBC 10.7* 10.5 8.3 8.7 8.0  HGB 10.6* 9.7* 10.1* 9.7* 9.7*  HCT 32.2* 30.8* 30.5* 30.8* 30.5*  PLT 372 363 338 342 322  MCV 93.6 93.1 91.9 93.6 93.8  MCH 30.8 29.3 30.4 29.5 29.8  MCHC 32.9 31.5 33.1 31.5 31.8  RDW 12.5 12.4 12.5  12.7 12.9  LYMPHSABS 1.9 2.8 2.3 2.5 2.6  MONOABS 0.5 0.6 0.6 0.5 0.6  EOSABS 0.1 0.2 0.3 0.3 0.4  BASOSABS 0.1 0.0 0.0 0.0 0.0    Recent Labs  Lab 10/27/19 0500 10/27/19 0500 10/28/19 0453 10/28/19 0454 10/29/19 0409 10/29/19 0410 10/30/19 0320 10/31/19 0411 11/01/19 0152 11/02/19 0342  NA 135   < > 132*  --  134*  --   --  134* 133* 135  K 3.5   < > 3.5  --  3.7  --   --  3.7 3.6 3.8  CL 102   < > 99  --  100  --   --  98 97* 98  CO2 25   < > 25  --  26  --   --  27 24 25   GLUCOSE 134*   < > 205*  --  145*  --   --  139* 228* 167*  BUN 11   < > 8  --  7  --   --  6 6 5*  CREATININE 0.73   < > 0.75  --  0.67  --   --  0.59* 0.62 0.74  CALCIUM 8.5*   < > 8.3*  --  8.5*  --   --  8.9 8.5* 8.7*  AST 28   < > 24  --  21  --   --  18 19 17   ALT 17   < > 18  --  17  --   --  17 18 17   ALKPHOS 93   < > 92  --  86  --   --  85 89 92  BILITOT 1.0   < > 0.6  --  0.8  --   --  0.7 0.6 0.7  ALBUMIN 2.2*   < > 2.1*  --  2.1*  --   --  2.1* 2.0* 2.1*  MG 1.7   < > 1.4*  --  1.4*  --   --  1.3* 1.5* 1.8  CRP 9.2*  --  8.8*  --  8.4*  --   --   --   --   --   DDIMER 5.77*  --  5.74*  --  5.18*  --   --   --   --   --   PROCALCITON 0.13  --  <0.10  --  <0.10  --  <0.10  --   --   --   BNP 54.1  --   --  46.0  --  76.9  --   --   --   --    < > = values in this interval not displayed.     ------------------------------------------------------------------------------------------------------------------ No results for input(s): CHOL, HDL, LDLCALC, TRIG, CHOLHDL, LDLDIRECT in the last 72 hours.  Lab Results  Component Value Date   HGBA1C 11.3 (H) 10/26/2019   ------------------------------------------------------------------------------------------------------------------ No results for input(s): TSH, T4TOTAL, T3FREE, THYROIDAB in the last 72 hours.  Invalid input(s): FREET3  Cardiac Enzymes No results for input(s): CKMB, TROPONINI, MYOGLOBIN in the last 168 hours.  Invalid input(s): CK ------------------------------------------------------------------------------------------------------------------    Component Value Date/Time   BNP 76.9 10/29/2019 0410    Micro Results No results found for this or any previous visit (from the past 240 hour(s)).  Radiology Reports CT HEAD WO CONTRAST  Result Date: 10/19/2019 CLINICAL DATA:  Deliriums.  Mental status change. EXAM: CT HEAD WITHOUT CONTRAST TECHNIQUE: Contiguous axial images were obtained from the  base of the skull through the vertex without intravenous contrast. COMPARISON:  None. FINDINGS: Brain: Area subcortical low-density involving the posterior left frontal lobe at the convexity, series 3, image 25 and series 5, image 49, likely encephalomalacia and sequela of remote infarct, however nonspecific. No hemorrhage. No evidence of acute ischemia. No hydrocephalus, midline shift or mass effect. No subdural or extra-axial collection. Vascular: Atherosclerosis of skullbase vasculature without hyperdense vessel or abnormal calcification. Skull: No fracture or focal lesion. Sinuses/Orbits: Nasogastric tube in place. Fluid levels in the sphenoid sinuses and right maxillary sinus with scattered opacification of ethmoid air cells may be related to intubation. Opacification of the right greater than left mastoid air cells. Other:  None. IMPRESSION: 1. No acute intracranial abnormality. 2. Area of subcortical low-density involving the posterior left frontal lobe at the convexity, likely encephalomalacia and sequela of remote infarct. There are no prior exams for comparison to establish chronicity. MRI could be considered for further evaluation based on clinical concern. 3. Paranasal sinus fluid levels on opacification of the right greater than left mastoid air cells, possibly related to intubation. Electronically Signed   By: Narda Rutherford M.D.   On: 10/19/2019 15:30   CT ANGIO CHEST PE W OR WO CONTRAST  Result Date: 10/23/2019 CLINICAL DATA:  COVID pneumonia, acute hypoxic respiratory failure, positive D-dimer EXAM: CT ANGIOGRAPHY CHEST WITH CONTRAST TECHNIQUE: Multidetector CT imaging of the chest was performed using the standard protocol during bolus administration of intravenous contrast. Multiplanar CT image reconstructions and MIPs were obtained to evaluate the vascular anatomy. CONTRAST:  75mL OMNIPAQUE IOHEXOL 350 MG/ML SOLN COMPARISON:  None. FINDINGS: Cardiovascular: Satisfactory opacification of the pulmonary arteries to the segmental level. No evidence of pulmonary embolism. The central pulmonary arteries are of normal caliber. There is moderate coronary artery calcification. Normal heart size. No pericardial effusion. The thoracic aorta is unremarkable save for bovine arch anatomy. Right upper extremity PICC line tip is seen within the superior vena cava. Mediastinum/Nodes: No enlarged mediastinal, hilar, or axillary lymph nodes. Thyroid gland, trachea, and esophagus demonstrate no significant findings. Nasoenteric feeding tube extends into the upper abdomen beyond the margin of the examination. Lungs/Pleura: Lung volumes are small. Evaluation is limited by motion artifact, however, multifocal pulmonary infiltrates are identified demonstrating a peripheral and basal predominance, likely infectious or inflammatory in the  acute setting. No central obstructing lesion. No pneumothorax or pleural effusion. No superimposed interstitial edema. Upper Abdomen: No acute abnormality. Musculoskeletal: No chest wall abnormality. No acute or significant osseous findings. Review of the MIP images confirms the above findings. IMPRESSION: 1. No evidence of pulmonary embolism. 2. Multifocal pulmonary infiltrates are identified demonstrating a peripheral and basal predominance, likely infectious or inflammatory in the acute setting. 3. Moderate coronary artery calcification. Electronically Signed   By: Helyn Numbers MD   On: 10/23/2019 16:25   MR ANGIO HEAD WO CONTRAST  Result Date: 10/29/2019 CLINICAL DATA:  Neuro deficit with stroke suspected EXAM: MRA HEAD WITHOUT CONTRAST TECHNIQUE: Angiographic images of the Circle of Willis were obtained using MRA technique without intravenous contrast. COMPARISON:  Four days ago FINDINGS: History of bacteremia and embolic infarction. No vessel beading, stenosis, or pseudoaneurysm. IMPRESSION: Negative intracranial MRA. Electronically Signed   By: Marnee Spring M.D.   On: 10/29/2019 12:04   MR BRAIN WO CONTRAST  Result Date: 10/25/2019 CLINICAL DATA:  Neuro deficit, acute, stroke suspected. Additional history provided: COVID positive. Additional history obtained from electronic MEDICAL RECORD NUMBERSepsis with MSSA and group B strep bacteremia. EXAM:  MRI HEAD WITHOUT CONTRAST TECHNIQUE: Multiplanar, multiecho pulse sequences of the brain and surrounding structures were obtained without intravenous contrast. COMPARISON:  Head CT 10/19/2019. FINDINGS: Brain: The examination is limited by poor signal on several sequences as well as motion degradation. Most notably, there is moderate motion degradation of the axial T2 weighted sequence, severe motion degradation of the axial SWI sequence and moderate motion degradation of the coronal T2 weighted sequence. Cerebral volume is normal for age. There is a 17 mm  focus of restricted diffusion and corresponding T2/FLAIR hyperintensity within the subcortical left frontoparietal lobes (for instance as seen on series 2, image 39) (series 3, image 17). Additional mild scattered T2/FLAIR hyperintensity within the cerebral white matter and pons is nonspecific, but consistent with chronic small vessel ischemic disease. Severe motion degradation of the axial SWI sequence precludes adequate evaluation for intracranial blood products. No extra-axial fluid collection. No midline shift. Vascular: Expected proximal arterial flow voids. Skull and upper cervical spine: No focal marrow lesion is identified within described limitations. Sinuses/Orbits: Visualized orbits show no acute finding. Air-fluid levels within the sphenoid and right maxillary sinuses. Partial opacification of right ethmoid air cells. Bilateral mastoid effusions. These results will be called to the ordering clinician or representative by the Radiologist Assistant, and communication documented in the PACS or [employer]. IMPRESSION: Examination limited by poor signal on several sequences as well as motion degradation, as described. 17 mm focus of restricted diffusion and T2/FLAIR hyperintensity within the subcortical left frontoparietal lobes. This finding is nonspecific, but given the patient's history, the primary differential considerations are acute/early subacute infarct versus cerebritis. Consider contrast-enhanced MR imaging of the brain for further evaluation of this lesion. Mild chronic small vessel ischemic disease. Paranasal sinus disease with air-fluid levels. Bilateral mastoid effusions. Electronically Signed   By: Jackey Loge DO   On: 10/25/2019 15:16   MR BRAIN W CONTRAST  Result Date: 10/25/2019 CLINICAL DATA:  Brain mass follow-up EXAM: MRI HEAD WITH CONTRAST TECHNIQUE: Multiplanar, multiecho pulse sequences of the brain and surrounding structures were obtained with intravenous contrast.  CONTRAST:  10mL GADAVIST GADOBUTROL 1 MMOL/ML IV SOLN COMPARISON:  Brain MRI without contrast 10/25/2019 FINDINGS: Brain: There is a peripherally enhancing lesion at the base of the left precentral gyrus that measures 0.9 x 0.7 x 1.8 cm. There are no other areas of abnormal contrast enhancement. 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 Electronically Signed   By: Deatra Robinson M.D.   On: 10/25/2019 22:47   MR FOOT LEFT WO CONTRAST  Result Date: 10/25/2019 CLINICAL DATA:  Bacteremia left foot pain and diabetic EXAM: MRI OF THE LEFT FOOT WITHOUT CONTRAST TECHNIQUE: Multiplanar, multisequence MR imaging of the left was performed. No intravenous contrast was administered. COMPARISON:  October 23, 2019 FINDINGS: Bones/Joint/Cartilage Mildly angulated nondisplaced fractures of the midshaft of the fourth and fifth metatarsals are noted. There is periosteal reaction seen along the medial margins of the metatarsal shafts. Increased heterogeneous T2 hyperintense signal with subtle T1 hypointensity seen at the base of the fourth and fifth metatarsals. No other osseous marrow signal abnormality is seen. There is no large knee joint effusions noted. The articular surfaces appear to be maintained. Ligaments The Lisfranc ligament is intact. Muscles and Tendons Increased feathery signal with atrophy is seen throughout the muscles of the forefoot. There is a small amount of fluid seen surrounding the posterior tibialis tendon. The remainder of the flexor and  extensor tendons are intact. Soft tissues Lateral plantar surface of the forefoot overlying the fifth metatarsal base there is a focal area of ulceration measuring approximately 8 mm in transverse dimension. A fluid-filled sinus tract is seen extending to the overlying osseous surface. There is a multilocular fluid collection which extends around the dorsal surface of the  fifth metatarsal measuring approximately 5.4 x 0.9 x 3.4 cm. There is extensive dorsal and lateral subcutaneous edema and skin thickening. IMPRESSION: Superficial area of ulceration over the plantar lateral aspect of the fifth metatarsal base with a fluid-filled sinus tract and loculated probable abscess extending over the dorsal surface of the fifth metatarsal measuring 5.4 x 0.9 by is a 3.4 cm. Incomplete mildly angulated fourth and fifth metatarsal shaft fractures with periosteal reaction Findings which could be suggestive of reactive marrow versus early osteomyelitis involving the base of the fourth and fifth metatarsals. Electronically Signed   By: Jonna Clark M.D.   On: 10/25/2019 19:06   DG Chest Port 1 View  Result Date: 10/26/2019 CLINICAL DATA:  Short of breath.  COVID positive EXAM: PORTABLE CHEST 1 VIEW COMPARISON:  10/25/2019 FINDINGS: Hypoventilation with decreased lung volume. Mild bibasilar airspace disease unchanged. No new infiltrate or effusion. Right arm PICC tip in the SVC unchanged. IMPRESSION: Hypoventilation with bibasilar mild airspace disease unchanged. Electronically Signed   By: Marlan Palau M.D.   On: 10/26/2019 16:10   DG Chest Port 1 View  Result Date: 10/25/2019 CLINICAL DATA:  Shortness of breath. EXAM: PORTABLE CHEST 1 VIEW COMPARISON:  October 16, 2019. FINDINGS: The heart size and mediastinal contours are within normal limits. Hypoinflation of the lungs is noted with mild bibasilar subsegmental atelectasis. Endotracheal tube has been removed. Right-sided PICC line is unchanged in position. The visualized skeletal structures are unremarkable. IMPRESSION: Hypoinflation of the lungs with mild bibasilar subsegmental atelectasis. Electronically Signed   By: Lupita Raider M.D.   On: 10/25/2019 08:37   DG CHEST PORT 1 VIEW  Result Date: 10/16/2019 CLINICAL DATA:  Status post intubation. EXAM: PORTABLE CHEST 1 VIEW COMPARISON:  10/14/2019 FINDINGS: ET tube tip is above the  carina. There is a right arm PICC line with tip at the cavoatrial junction. Lung volumes are low. Similar appearance of bilateral pulmonary opacities compatible with multifocal pneumonia. IMPRESSION: 1. Stable support apparatus. 2. Persistent bilateral pulmonary opacities compatible with multifocal pneumonia. Electronically Signed   By: Signa Kell M.D.   On: 10/16/2019 06:14   DG Chest Port 1 View  Result Date: 10/15/2019 CLINICAL DATA:  49 year old male with history of COVID infection. EXAM: PORTABLE CHEST 1 VIEW COMPARISON:  Chest x-ray 10/14/2019. FINDINGS: An endotracheal tube is in place with tip 3.6 cm above the carina. A nasogastric tube is seen extending into the stomach, however, the tip of the nasogastric tube extends below the lower margin of the image. There is a right upper extremity PICC with tip terminating in the right atrium. Lung volumes are low. Widespread patchy areas of interstitial prominence an ill-defined airspace disease again noted throughout the lungs bilaterally. Overall, aeration is essentially unchanged. No pleural effusions. No evidence of pulmonary edema. No pneumothorax. Heart size is normal. Upper mediastinal contours are within normal limits. IMPRESSION: 1. Support apparatus, as above. 2. The appearance the chest is compatible with multilobar pneumonia from reported COVID infection. Electronically Signed   By: Trudie Reed M.D.   On: 10/15/2019 05:39   DG Chest Port 1 View  Result Date: 10/14/2019 CLINICAL DATA:  COVID-19,  ARDS EXAM: PORTABLE CHEST 1 VIEW COMPARISON:  10/13/2019 FINDINGS: Single frontal view of the chest demonstrates stable position of the endotracheal tube and enteric catheter. There is a right-sided PICC tip overlying superior vena cava. The cardiac silhouette is stable. Interstitial and ground-glass opacities are seen throughout the lungs bilaterally, unchanged. Lung volumes are diminished. No effusion or pneumothorax. IMPRESSION: 1. 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 2. Stable support devices. Electronically Signed   By: Sharlet Salina M.D.   On: 10/14/2019 15:57   DG Chest Port 1 View  Result Date: 10/13/2019 CLINICAL DATA:  Acute respiratory failure with hypoxemia, COVID positive, asthma, diabetes mellitus, hypertension, CHF EXAM: PORTABLE CHEST 1 VIEW COMPARISON:  Portable exam 1021 hours compared to 10/12/2019 FINDINGS: Tip of endotracheal tube projects 3.7 cm above carina. Nasogastric tube extends into stomach. RIGHT arm PICC line tip projects over cavoatrial junction. Stable heart size mediastinal contours for technique. Patchy BILATERAL airspace infiltrates consistent with multifocal pneumonia, minimally improved. No pleural effusion or pneumothorax. IMPRESSION: Minimally improved pulmonary infiltrates. Electronically Signed   By: Ulyses Southward M.D.   On: 10/13/2019 10:37   DG CHEST PORT 1 VIEW  Result Date: 10/12/2019 CLINICAL DATA:  Intubation EXAM: PORTABLE CHEST 1 VIEW COMPARISON:  Earlier today FINDINGS: New endotracheal tube with tip at the clavicular heads, measuring 3 cm above the carina. Low volume chest with severe airspace disease. No visible effusion or air leak. Stable heart size which is distorted by positioning and volumes. IMPRESSION: 1. Unremarkable positioning of the endotracheal tube. 2. Stable extensive airspace disease with low lung volumes. Electronically Signed   By: Marnee Spring M.D.   On: 10/12/2019 07:13   DG CHEST PORT 1 VIEW  Result Date: 10/12/2019 CLINICAL DATA:  Shortness of breath EXAM: PORTABLE CHEST 1 VIEW COMPARISON:  Three days ago FINDINGS: Very low volume chest with diffuse and patchy pulmonary opacity. Vascular pedicle widening and prominent heart size largely related to technique. No visible effusion or pneumothorax. IMPRESSION: Worsening multifocal pneumonia.  Lung volumes remain very low.  Electronically Signed   By: Marnee Spring M.D.   On: 10/12/2019 04:07   DG Chest Portable 1 View  Result Date: 10/09/2019 CLINICAL DATA:  Cough, fever, shortness of breath EXAM: PORTABLE CHEST 1 VIEW COMPARISON:  None. FINDINGS: Single frontal view of the chest demonstrates unremarkable cardiac silhouette. The lung volumes are diminished, with patchy bilateral areas of faint ground-glass airspace disease consistent with multifocal pneumonia. No effusion or pneumothorax. IMPRESSION: 1. Patchy bilateral ground-glass airspace disease consistent with multifocal pneumonia. Electronically Signed   By: Sharlet Salina M.D.   On: 10/09/2019 19:32   DG Abd Portable 1V  Result Date: 10/20/2019 CLINICAL DATA:  GI problem. EXAM: PORTABLE ABDOMEN - 1 VIEW COMPARISON:  10/16/2019 FINDINGS: Feeding tube tip is in the distal stomach or proximal duodenum. Mild gaseous distention of the stomach. Gas throughout nondistended large and small bowel. No evidence of bowel obstruction, organomegaly or free air. IMPRESSION: Feeding tube tip in the distal stomach or proximal duodenum. No evidence of obstruction or free air. Electronically Signed   By: Charlett Nose M.D.   On: 10/20/2019 21:28   DG Abd Portable 1V  Result Date: 10/16/2019 CLINICAL DATA:  Status post OG tube placement EXAM: PORTABLE ABDOMEN - 1 VIEW COMPARISON:  10/16/2019 FINDINGS: The enteric tube tip projects over the distal body of stomach in the side port is below the level of  the GE junction. A few air-filled loops of small bowel are noted within the upper abdomen which measure up to 2.8 cm. IMPRESSION: Satisfactory position of enteric tube. Electronically Signed   By: Signa Kell M.D.   On: 10/16/2019 06:52   DG Abd Portable 1V  Result Date: 10/12/2019 CLINICAL DATA:  Onychogryphosis EXAM: PORTABLE ABDOMEN - 1 VIEW COMPARISON:  Portable exam 0830 hours without priors for comparison FINDINGS: Tip of nasogastric tube projects over distal antrum.  Nonobstructive bowel gas pattern. No bowel dilatation or bowel wall thickening. Patchy bibasilar infiltrates. No acute osseous findings. IMPRESSION: Tip of nasogastric tube projects over distal gastric antrum. Nonobstructive bowel gas pattern. Bibasilar pulmonary infiltrates. Electronically Signed   By: Ulyses Southward M.D.   On: 10/12/2019 08:51   DG Foot 2 Views Left  Result Date: 10/23/2019 CLINICAL DATA:  Bacteremia, LEFT foot pain and diabetic. EXAM: LEFT FOOT - 2 VIEW COMPARISON:  10/13/2019 and prior radiographs FINDINGS: Mildly angulated fractures of the 4th and 5th metatarsals are again noted with fracture lines still evident. Healing changes along these fractures are noted. Overlying soft tissue swelling is noted. No new fracture, subluxation or dislocation noted. No definite radiographic evidence of acute osteomyelitis noted. IMPRESSION: Mildly angulated healing fractures of the 4th and 5th metatarsals with soft tissue swelling again noted but without definite radiographic evidence of acute osteomyelitis. Consider MRI for further evaluation as clinically indicated. Electronically Signed   By: Harmon Pier M.D.   On: 10/23/2019 13:40   DG Foot 2 Views Left  Result Date: 10/13/2019 CLINICAL DATA:  49 year old male with diabetic foot. EXAM: LEFT FOOT - 2 VIEW COMPARISON:  Left foot radiograph dated 09/11/2019. FINDINGS: Evaluation is limited due to positioning. Fractures of the midportion of the fourth and fifth metacarpal with interval progression of bridging callus formation. No new fracture identified. There is no dislocation. There is diffuse soft tissue swelling of the foot. No radiopaque foreign object or soft tissue gas. IMPRESSION: 1. Healing fractures of the fourth and fifth metacarpal. 2. Diffuse soft tissue swelling. No radiopaque foreign object or soft tissue gas. Electronically Signed   By: Elgie Collard M.D.   On: 10/13/2019 16:49   EEG adult  Result Date: 10/27/2019 Charlsie Quest,  MD     10/28/2019 10:13 AM Patient Name: ULAS ZUERCHER MRN: [id number] Epilepsy Attending: Charlsie Quest Referring Physician/Provider: Dr. [name] Date: 10/27/2019 Duration: 22.22 minutes Patient history: 49 year old male with altered mental status. EEG evaluate for seizures. Level of alertness: Awake, asleep AEDs during EEG study: Keppra Technical aspects: This EEG study was done with scalp electrodes positioned according to the 10-20 International system of electrode placement. Electrical activity was acquired at a sampling rate of 500Hz  and reviewed with a high frequency filter of 70Hz  and a low frequency filter of 1Hz . EEG data were recorded continuously and digitally stored. Description: The posterior dominant rhythm consists of 8 Hz activity of moderate voltage (25-35 uV) seen predominantly in posterior head regions, symmetric and reactive to eye opening and eye closing. Sleep was characterized by vertex waves, sleep spindles (12 to 14 Hz), maximal frontocentral region. Frequent runs of sharp waves were seen in frontocentral region, maximal vertex lasting about 5 to 6 seconds consistent with brief ictal-interictal rhythmic discharges. Continuous generalized 5 to 6 Hz theta slowing was also noted. Hyperventilation and photic stimulation were not performed.   ABNORMALITY -Continuous slow, generalized -Brief ictal-interictal rhythmic discharges, bilateral frontocentral region, maximal vertex IMPRESSION: This study showed evidence of  potential epileptogenicity arising from bilateral frontocentral, maximal vertex region. The sharp waves are at times rhythmic consistent with brief ictal-interictal rhythmic discharges. Additionally, there is evidence of mild diffuse encephalopathy, nonspecific etiology. Priyanka [name]   Overnight EEG with video  Result Date: 10/28/2019 Charlsie Quest, MD     10/29/2019  8:27 AM Patient Name: NIKOLA MARONE MRN: [id number] Epilepsy Attending: Charlsie Quest Referring  Physician/Provider: Dr. [name] Duration: 10/27/2019 1145 to 10/28/2019 1145  Patient history: 50 year old male with altered mental status. EEG evaluate for seizures.  Level of alertness: Awake, asleep AEDs during EEG study: Keppra  Technical aspects: This EEG study was done with scalp electrodes positioned according to the 10-20 International system of electrode placement. Electrical activity was acquired at a sampling rate of 500Hz  and reviewed with a high frequency filter of 70Hz  and a low frequency filter of 1Hz . EEG data were recorded continuously and digitally stored.  Description: The posterior dominant rhythm consists of 8 Hz activity of moderate voltage (25-35 uV) seen predominantly in posterior head regions, symmetric and reactive to eye opening and eye closing. Sleep was characterized by vertex waves, sleep spindles (12 to 14 Hz), maximal frontocentral region. Initially, frequent runs of sharp waves were seen in frontocentral region, maximal vertex lasting about 5 to 6 seconds consistent with brief ictal-interictal rhythmic discharges. Continuous generalized 5 to 6 Hz theta slowing as well as intermittent generalized rhythmic 2-3hz  delta slowing  was also noted. Hyperventilation and photic stimulation were not performed.    ABNORMALITY -Continuous slow, generalized - Intermittent rhythmic delta slowing, generalized -Brief ictal-interictal rhythmic discharges, bilateral frontocentral region, maximal vertex  IMPRESSION: This study initially showed evidence of potential epileptogenicity arising from bilateral frontocentral, maximal vertex region. The sharp waves are at times rhythmic consistent with brief ictal-interictal rhythmic discharges. Additionally, there is evidence of mild diffuse encephalopathy, nonspecific etiology. No seizures were seen during thi study. EEG appears to be improving compared to previous day.    ECHOCARDIOGRAM COMPLETE  Result Date: 10/12/2019     ECHOCARDIOGRAM REPORT   Patient Name:   ABDURRAHMAN PETERSHEIM Date of Exam: 10/12/2019 Medical Rec #:  10/14/2019      Height:       74.0 in Accession #:    [name]     Weight:       245.0 lb Date of Birth:  March 22, 1970      BSA:          2.369 m Patient Age:    49 years       BP:           111/74 mmHg Patient Gender: M              HR:           75 bpm. Exam Location:  Inpatient Procedure: 2D Echo Indications:    CHF 428                  & Bacteremia 790.7  History:        Patient has no prior history of Echocardiogram examinations.                 CHF; Risk Factors:Hypertension and Diabetes. Covid +.  Sonographer:    [id number] RDCS (AE) Referring Phys: [id number] 05-09-2006 [facility]  Sonographer Comments: Echo performed with patient supine and on artificial respirator. Image acquisition challenging due to respiratory motion and Image acquisition challenging due to patient body habitus. restricted  mobility IMPRESSIONS  1. Extremely poor acoustic windows. LVEF appears to be depressed with diffuse hypokinesis, worse in the mid/distal inferior/inferoseptal walls. . Left ventricular ejection fraction, by estimation, is 30 to 35%. The left ventricle has moderately decreased function. The left ventricular internal cavity size was mildly dilated. Left ventricular diastolic parameters are indeterminate.  2. Right ventricular systolic function is moderately reduced. The right ventricular size is mildly enlarged.  3. The mitral valve is normal in structure. Trivial mitral valve regurgitation.  4. The aortic valve is normal in structure. Aortic valve regurgitation is not visualized. FINDINGS  Left Ventricle: Extremely poor acoustic windows. LVEF appears to be depressed with diffuse hypokinesis, worse in the mid/distal inferior/inferoseptal walls. Left ventricular ejection fraction, by estimation, is 30 to 35%. The left ventricle has moderately decreased function. The left ventricle demonstrates regional wall motion abnormalities. The left  ventricular internal cavity size was mildly dilated. There is no left ventricular hypertrophy. Left ventricular diastolic parameters are indeterminate. Right Ventricle: The right ventricular size is mildly enlarged. Right vetricular wall thickness was not assessed. Right ventricular systolic function is moderately reduced. Left Atrium: Left atrial size was normal in size. Right Atrium: Right atrial size was normal in size. Pericardium: There is no evidence of pericardial effusion. Mitral Valve: The mitral valve is normal in structure. Trivial mitral valve regurgitation. Tricuspid Valve: The tricuspid valve is normal in structure. Tricuspid valve regurgitation is trivial. Aortic Valve: The aortic valve is normal in structure. Aortic valve regurgitation is not visualized. Pulmonic Valve: The pulmonic valve was grossly normal. Pulmonic valve regurgitation is trivial. Aorta: The aortic root is normal in size and structure. IAS/Shunts: The interatrial septum was not assessed.  LEFT VENTRICLE PLAX 2D LVIDd:         5.60 cm  Diastology LVIDs:         3.90 cm  LV e' lateral:   8.81 cm/s LV PW:         1.40 cm  LV E/e' lateral: 5.7 LV IVS:        1.10 cm  LV e' medial:    5.87 cm/s LVOT diam:     2.40 cm  LV E/e' medial:  8.5 LV SV:         64 LV SV Index:   27 LVOT Area:     4.52 cm  RIGHT VENTRICLE RV S prime:     9.79 cm/s TAPSE (M-mode): 1.3 cm LEFT ATRIUM             Index       RIGHT ATRIUM           Index LA diam:        3.10 cm 1.31 cm/m  RA Area:     18.80 cm LA Vol (A2C):   62.9 ml 26.55 ml/m RA Volume:   51.80 ml  21.86 ml/m LA Vol (A4C):   40.8 ml 17.22 ml/m LA Biplane Vol: 50.8 ml 21.44 ml/m  AORTIC VALVE LVOT Vmax:   69.80 cm/s LVOT Vmean:  49.400 cm/s LVOT VTI:    0.142 m  AORTA Ao Root diam: 3.60 cm MITRAL VALVE MV Area (PHT): 2.34 cm    SHUNTS MV Decel Time: 324 msec    Systemic VTI:  0.14 m MV E velocity: 50.10 cm/s  Systemic Diam: 2.40 cm MV A velocity: 71.60 cm/s MV E/A ratio:  0.70 Dietrich Pates MD  Electronically signed by Dietrich Pates MD Signature Date/Time: 10/12/2019/1:58:41 PM    Final  ECHO TEE  Result Date: 10/24/2019    TRANSESOPHOGEAL ECHO REPORT   Patient Name:   CAREY JOHNDROW Date of Exam: 10/24/2019 Medical Rec #:  [id number]      Height:       74.0 in Accession #:    [id number]     Weight:       247.4 lb Date of Birth:  March 24, 1970      BSA:          2.379 m Patient Age:    49 years       BP:           120/80 mmHg Patient Gender: M              HR:           100 bpm. Exam Location:  Inpatient Procedure: Transesophageal Echo Indications:    Emobli/Bacteremia  History:        Patient has prior history of Echocardiogram examinations.  Sonographer:    Ross Ludwig RDCS (AE) Referring Phys: [phone] HAO MENG PROCEDURE: The transesophogeal probe was passed without difficulty through the esophogus of the patient. Sedation performed by different physician. The patient's vital signs; including heart rate, blood pressure, and oxygen saturation; remained stable throughout the procedure. The patient developed no complications during the procedure. IMPRESSIONS  1. Left ventricular ejection fraction, by estimation, is 50 to 55%. The left ventricle has low normal function. The left ventricle has no regional wall motion abnormalities.  2. Right ventricular systolic function is normal. The right ventricular size is normal.  3. No left atrial/left atrial appendage thrombus was detected.  4. The mitral valve is normal in structure. Trivial mitral valve regurgitation. No evidence of mitral stenosis.  5. The aortic valve is normal in structure. Aortic valve regurgitation is not visualized. No aortic stenosis is present.  6. The inferior vena cava is normal in size with greater than 50% respiratory variability, suggesting right atrial pressure of 3 mmHg. Conclusion(s)/Recommendation(s): Normal biventricular function without evidence of hemodynamically significant valvular heart disease. FINDINGS  Left Ventricle: Left  ventricular ejection fraction, by estimation, is 50 to 55%. The left ventricle has low normal function. The left ventricle has no regional wall motion abnormalities. The left ventricular internal cavity size was normal in size. There is no left ventricular hypertrophy. Right Ventricle: The right ventricular size is normal. No increase in right ventricular wall thickness. Right ventricular systolic function is normal. Left Atrium: Left atrial size was normal in size. No left atrial/left atrial appendage thrombus was detected. Right Atrium: Right atrial size was normal in size. Pericardium: There is no evidence of pericardial effusion. Mitral Valve: The mitral valve is normal in structure. Normal mobility of the mitral valve leaflets. Trivial mitral valve regurgitation. No evidence of mitral valve stenosis. There is no evidence of mitral valve vegetation. Tricuspid Valve: The tricuspid valve is normal in structure. Tricuspid valve regurgitation is mild . No evidence of tricuspid stenosis. There is no evidence of tricuspid valve vegetation. Aortic Valve: The aortic valve is normal in structure. Aortic valve regurgitation is not visualized. No aortic stenosis is present. There is no evidence of aortic valve vegetation. Pulmonic Valve: The pulmonic valve was normal in structure. Pulmonic valve regurgitation is trivial. No evidence of pulmonic stenosis. Aorta: The aortic root is normal in size and structure. Venous: The inferior vena cava is normal in size with greater than 50% respiratory variability, suggesting right atrial pressure of 3 mmHg. IAS/Shunts: No atrial level shunt  detected by color flow Doppler. [name] MD Electronically signed by [name] MD Signature Date/Time: 10/24/2019/3:28:45 PM    Final    VAS US CAROTID (at [facility] and [facility] only)  Result Date: 10/26/2019 Carotid Arterial Duplex Study Indications:       28mm focus of diffusion restriction in the left                    frontoparietal lobe  concerning for acute/early subacute                    infarct vs cerebritis. Risk Factors:      Hypertension, Diabetes. Other Factors:     Covid-19, bacteremia. Comparison Study:  No prior study on file Performing Technologist: Sherren Kerns RVS  Examination Guidelines: A complete evaluation includes B-mode imaging, spectral Doppler, color Doppler, and power Doppler as needed of all accessible portions of each vessel. Bilateral testing is considered an integral part of a complete examination. Limited examinations for reoccurring indications may be performed as noted.  Right Carotid Findings: +----------+--------+--------+--------+------------------+--------+           PSV cm/sEDV cm/sStenosisPlaque DescriptionComments +----------+--------+--------+--------+------------------+--------+ CCA Prox  66      11                                         +----------+--------+--------+--------+------------------+--------+ CCA Distal71      18                                         +----------+--------+--------+--------+------------------+--------+ ICA Prox  58      19                                         +----------+--------+--------+--------+------------------+--------+ ICA Distal90      33                                         +----------+--------+--------+--------+------------------+--------+ ECA       114     16                                         +----------+--------+--------+--------+------------------+--------+ +----------+--------+-------+--------+-------------------+           PSV cm/sEDV cmsDescribeArm Pressure (mmHG) +----------+--------+-------+--------+-------------------+ [id number]                                        +----------+--------+-------+--------+-------------------+ +---------+--------+--+--------+--+ VertebralPSV cm/s38EDV cm/s11 +---------+--------+--+--------+--+  Left Carotid Findings:  +----------+--------+--------+--------+------------------+--------+           PSV cm/sEDV cm/sStenosisPlaque DescriptionComments +----------+--------+--------+--------+------------------+--------+ CCA Prox  55      14                                         +----------+--------+--------+--------+------------------+--------+ CCA Distal65      20                                         +----------+--------+--------+--------+------------------+--------+  ICA Prox  68      20                                         +----------+--------+--------+--------+------------------+--------+ ICA Distal76      23                                         +----------+--------+--------+--------+------------------+--------+ ECA       77      3                                          +----------+--------+--------+--------+------------------+--------+ +----------+--------+--------+--------+-------------------+           PSV cm/sEDV cm/sDescribeArm Pressure (mmHG) +----------+--------+--------+--------+-------------------+ [id number]                                          +----------+--------+--------+--------+-------------------+ +---------+--------+--+--------+--+ VertebralPSV cm/s63EDV cm/s18 +---------+--------+--+--------+--+   Summary: Right Carotid: The extracranial vessels were near-normal with only minimal wall                thickening or plaque. Left Carotid: The extracranial vessels were near-normal with only minimal wall               thickening or plaque. Vertebrals:  Bilateral vertebral arteries demonstrate antegrade flow. Subclavians: Normal flow hemodynamics were seen in bilateral subclavian              arteries. *See table(s) above for measurements and observations.  Electronically signed by Delia Heady MD on 10/26/2019 at 12:37:48 PM.    Final    VAS [place] LOWER EXTREMITY ARTERIAL DUPLEX  Result Date: 10/26/2019 LOWER EXTREMITY ARTERIAL DUPLEX STUDY  Indications: Ulceration, and Osteomyelitis. High Risk Factors: Hypertension, Diabetes. Other Factors: Covid-19. Charcot foot.  Current ABI: N/A Comparison Study: No prior study Performing Technologist: Sherren Kerns RVS  Examination Guidelines: A complete evaluation includes B-mode imaging, spectral Doppler, color Doppler, and power Doppler as needed of all accessible portions of each vessel. Bilateral testing is considered an integral part of a complete examination. Limited examinations for reoccurring indications may be performed as noted.  +----------+--------+-----+--------+---------+--------+ LEFT      PSV cm/sRatioStenosisWaveform Comments +----------+--------+-----+--------+---------+--------+ CFA Prox  123                  triphasic         +----------+--------+-----+--------+---------+--------+ DFA       51                   triphasic         +----------+--------+-----+--------+---------+--------+ SFA Prox  97                   triphasic         +----------+--------+-----+--------+---------+--------+ SFA Mid   88                   triphasic         +----------+--------+-----+--------+---------+--------+ SFA Distal81                   triphasic         +----------+--------+-----+--------+---------+--------+  POP Prox  60                   triphasic         +----------+--------+-----+--------+---------+--------+ POP Distal86                   triphasic         +----------+--------+-----+--------+---------+--------+ ATA Distal110                  triphasic         +----------+--------+-----+--------+---------+--------+ PTA Prox  34                   triphasic         +----------+--------+-----+--------+---------+--------+ PTA Mid   29                   triphasic         +----------+--------+-----+--------+---------+--------+ PTA [id number]                  triphasic         +----------+--------+-----+--------+---------+--------+   Summary: Left: Normal waveforms and adequate flow noted.  See table(s) above for measurements and observations. Electronically signed by Lemar Livings MD on 10/26/2019 at 4:50:14 PM.    Final    VAS [place] LOWER EXTREMITY ARTERIAL DUPLEX  Result Date: 10/19/2019 LOWER EXTREMITY ARTERIAL DUPLEX STUDY Indications: Cold leg, Covid-19.  Current ABI: N/A Comparison Study: No prior study Performing Technologist: Sherren Kerns RVS  Examination Guidelines: A complete evaluation includes B-mode imaging, spectral Doppler, color Doppler, and power Doppler as needed of all accessible portions of each vessel. Bilateral testing is considered an integral part of a complete examination. Limited examinations for reoccurring indications may be performed as noted.  +-----------+--------+-----+--------+---------+--------+ RIGHT      PSV cm/sRatioStenosisWaveform Comments +-----------+--------+-----+--------+---------+--------+ CFA Prox   67                   triphasic         +-----------+--------+-----+--------+---------+--------+ DFA        60                   triphasic         +-----------+--------+-----+--------+---------+--------+ SFA Prox   68                   triphasic         +-----------+--------+-----+--------+---------+--------+ SFA Mid    62                   triphasic         +-----------+--------+-----+--------+---------+--------+ SFA Distal 69                   triphasic         +-----------+--------+-----+--------+---------+--------+ POP Prox   47                   triphasic         +-----------+--------+-----+--------+---------+--------+ POP Distal 42                   triphasic         +-----------+--------+-----+--------+---------+--------+ ATA Distal 15                   triphasic         +-----------+--------+-----+--------+---------+--------+ PTA Prox   31                   triphasic          +-----------+--------+-----+--------+---------+--------+  PTA Mid    38                   triphasic         +-----------+--------+-----+--------+---------+--------+ PTA Distal 38                   triphasic         +-----------+--------+-----+--------+---------+--------+ PERO Distal36                   triphasic         +-----------+--------+-----+--------+---------+--------+  Summary: Right: Normal examination. No evidence of arterial occlusive disease. Normal waveforms, no stenosis.  See table(s) above for measurements and observations. Electronically signed by Sherald Hess MD on 10/19/2019 at 3:25:02 PM.    Final    VAS [place] LOWER EXTREMITY VENOUS (DVT)  Result Date: 10/26/2019  Lower Venous DVTStudy Indications: Covid-19, elevated D-Dimer.  Comparison Study: Prior negative left lower extremity venous duplex don 03/19/17                   and is available for comparison. Performing Technologist: Sherren Kerns RVS  Examination Guidelines: A complete evaluation includes B-mode imaging, spectral Doppler, color Doppler, and power Doppler as needed of all accessible portions of each vessel. Bilateral testing is considered an integral part of a complete examination. Limited examinations for reoccurring indications may be performed as noted. The reflux portion of the exam is performed with the patient in reverse Trendelenburg.  +---------+---------------+---------+-----------+----------+--------------+ RIGHT    CompressibilityPhasicitySpontaneityPropertiesThrombus Aging +---------+---------------+---------+-----------+----------+--------------+ CFV      Full           Yes      Yes                                 +---------+---------------+---------+-----------+----------+--------------+ SFJ      Full                                                        +---------+---------------+---------+-----------+----------+--------------+ FV Prox  Full                                                         +---------+---------------+---------+-----------+----------+--------------+ FV Mid   Full                                                        +---------+---------------+---------+-----------+----------+--------------+ FV DistalFull                                                        +---------+---------------+---------+-----------+----------+--------------+ PFV      Full                                                        +---------+---------------+---------+-----------+----------+--------------+  POP      Partial        Yes      Yes                  Acute          +---------+---------------+---------+-----------+----------+--------------+ PTV      Full                                                        +---------+---------------+---------+-----------+----------+--------------+ PERO     Full                                                        +---------+---------------+---------+-----------+----------+--------------+   +---------+---------------+---------+-----------+----------+--------------+ LEFT     CompressibilityPhasicitySpontaneityPropertiesThrombus Aging +---------+---------------+---------+-----------+----------+--------------+ CFV      Full           Yes      Yes                                 +---------+---------------+---------+-----------+----------+--------------+ SFJ      Full                                                        +---------+---------------+---------+-----------+----------+--------------+ FV Prox  Full                                                        +---------+---------------+---------+-----------+----------+--------------+ FV Mid   Full                                                        +---------+---------------+---------+-----------+----------+--------------+ FV DistalFull                                                         +---------+---------------+---------+-----------+----------+--------------+ PFV      Full                                                        +---------+---------------+---------+-----------+----------+--------------+ POP      Full           Yes      Yes                                 +---------+---------------+---------+-----------+----------+--------------+  PTV      Full                                                        +---------+---------------+---------+-----------+----------+--------------+ PERO     Full                                                        +---------+---------------+---------+-----------+----------+--------------+     Summary: RIGHT: - Findings consistent with acute deep vein thrombosis involving the right popliteal vein. - Ultrasound characteristics of enlarged lymph nodes are noted in the groin.  LEFT: - There is no evidence of deep vein thrombosis in the lower extremity.  *See table(s) above for measurements and observations. Electronically signed by Lemar Livings MD on 10/26/2019 at 4:50:27 PM.    Final    [place] EKG SITE RITE  Result Date: 10/12/2019 If Site Rite image not attached, placement could not be confirmed due to current cardiac rhythm.

## 2019-11-02 NOTE — Progress Notes (Signed)
Physical Therapy Treatment Patient Details Name: Micheal Bell MRN: [id number] DOB: 09-13-1970 Today's Date: 11/02/2019    History of Present Illness Pt is a 49 y.o. male admitted 10/09/19 for COVID PNA. Pt decompensated 8/19 and intubated; self extubated 8/23 and reintubated; extubated 8/28. Course complicated by MSSA and GBS bacteria, sepsis, systolic HF, acute metabolic encephalopathy. 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 TEE without evidence of vegitation. 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    PT Comments    Pt continues to make good progress with mobility. Able to walk very short distance today with walker and mod assist. Continues to be very motivated to return to more independence.    Follow Up Recommendations  CIR;Supervision/Assistance - 24 hour     Equipment Recommendations  Other (comment) (To be determined)    Recommendations for Other Services       Precautions / Restrictions Precautions Precautions: Fall;Other (comment) Precaution Comments: wounds on feet    Mobility  Bed Mobility Overal bed mobility: Needs Assistance Bed Mobility: Rolling;Sidelying to Sit Rolling: Min guard Sidelying to sit: Max assist       General bed mobility comments: Assist to elevate trunk into sitting. Verbal cues for technique  Transfers Overall transfer level: Needs assistance Equipment used: Rolling walker (2 wheeled) Transfers: Sit to/from [employer] Sit to Stand: Mod assist Stand pivot transfers: Mod assist       General transfer comment: Assist to bring hips up and for balance. Stood from elevated bed and then stood from regular chair with armrests. Bed to chair with pivotal steps  with walker  Ambulation/Gait Ambulation/Gait assistance: Mod assist Gait Distance (Feet): 5 Feet Assistive device: Rolling walker (2 wheeled) Gait Pattern/deviations: Step-to pattern;Decreased step length - right;Decreased step length - left;Narrow base of support Gait velocity: decr Gait velocity interpretation: <1.31 ft/sec, indicative of household ambulator General Gait Details: Assist for balance and support. Knees tend to hyperextend in stance   Stairs             Wheelchair Mobility    Modified Rankin (Stroke Patients Only) Modified Rankin (Stroke Patients Only) Pre-Morbid Rankin Score: No symptoms Modified Rankin: Severe disability     Balance Overall balance assessment: Needs assistance Sitting-balance support: Feet supported;No upper extremity supported Sitting balance-Leahy Scale: Fair     Standing balance support: During functional activity;Bilateral upper extremity supported Standing balance-Leahy Scale: Poor Standing balance comment: walker and min assist for static standing                            Cognition Arousal/Alertness: Awake/alert Behavior During Therapy: WFL for tasks assessed/performed;Flat affect Overall Cognitive Status: Impaired/Different from baseline Area of Impairment: Orientation;Attention;Memory;Following commands;Safety/judgement;Awareness;Problem solving                   Current Attention Level: Selective Memory: Decreased short-term memory Following Commands: Follows multi-step commands with increased time Safety/Judgement: Decreased awareness of safety;Decreased awareness of deficits Awareness: Intellectual Problem Solving: Slow processing;Requires verbal cues General Comments: Pt continues to overestimate his abilities regarding mobility.       Exercises      General Comments General comments (skin integrity, edema, etc.): VSS on RA      Pertinent Vitals/Pain Pain Assessment:  No/denies pain     Home Living                      Prior Function            PT Goals (current goals can now be found in the care plan section) Acute Rehab PT Goals Patient Stated Goal: return home Progress towards PT goals: Progressing toward goals    Frequency    Min 3X/week      PT Plan Current plan remains appropriate;Frequency needs to be updated    Co-evaluation              AM-PAC PT "6 Clicks" Mobility   Outcome Measure  Help needed turning from your back to your side while in a flat bed without using bedrails?: A Little Help needed moving from lying on your back to sitting on the side of a flat bed without using bedrails?: A Lot Help needed moving to and from a bed to a chair (including a wheelchair)?: A Lot Help needed standing up from a chair using your arms (e.g., wheelchair or bedside chair)?: A Lot Help needed to walk in hospital room?: A Lot Help needed climbing 3-5 steps with a railing? : Total 6 Click Score: 12    End of Session Equipment Utilized During Treatment: Gait belt Activity Tolerance: Patient tolerated treatment well Patient left: with call bell/phone within reach;in chair;with chair alarm set Nurse Communication: Mobility status PT Visit Diagnosis: Other abnormalities of gait and mobility (R26.89);Muscle weakness (generalized) (M62.81);Other symptoms and signs involving the nervous system (R29.898)     Time: 1201-1229 PT Time Calculation (min) (ACUTE ONLY): 28 min  Charges:  $Gait Training: 8-22 mins $Therapeutic Activity: 8-22 mins                     [facility] PT Acute Rehabilitation Services Pager [phone] Office [phone]    Angelina Ok [facility] 11/02/2019, 2:18 PM

## 2019-11-02 NOTE — Progress Notes (Signed)
ANTICOAGULATION CONSULT NOTE - Follow Up Consult  Pharmacy Consult for Lovenox Indication: DVT  Allergies  Allergen Reactions  . Azithromycin   . Lodine [Etodolac] Anaphylaxis    Patient Measurements: Height: 6\' 2"  (188 cm) Weight: 112.6 kg (248 lb 3.8 oz) IBW/kg (Calculated) : 82.2   Vital Signs: Temp: 98 F (36.7 C) (09/09 0724) Temp Source: Oral (09/09 0724) BP: 127/73 (09/09 0724) Pulse Rate: 80 (09/09 0724)  Labs: Recent Labs    10/31/19 0411 10/31/19 0411 11/01/19 0152 11/02/19 0342  HGB 10.1*   < > 9.7* 9.7*  HCT 30.5*  --  30.8* 30.5*  PLT 338  --  342 322  CREATININE 0.59*  --  0.62 0.74   < > = values in this interval not displayed.    Estimated Creatinine Clearance: 145.9 mL/min (by C-G formula based on SCr of 0.74 mg/dL).   Assessment: Anticoag: Enox 1mg /kg/Q12 on 9/3 for new RLE DVT per duplex 9/2 pm (prior duplex 8/27 neg). R foot had been dusky, arterial 11/2 neg. Hgb 9.7 stable. Scr <1  Goal of Therapy:  Anti-Xa level 0.6-1 units/ml 4hrs after LMWH dose given Monitor platelets by anticoagulation protocol: Yes   Plan:  Decrease Lovenox to 110mg  SQ q12hrs with weight loss. CBC q72h on LMWH   Lynsay Fesperman S. 9/27, PharmD, BCPS Clinical Staff Pharmacist [email or web address] [place], Ayah Cozzolino Stillinger 11/02/2019,9:41 AM

## 2019-11-02 NOTE — Progress Notes (Signed)
  Speech Language Pathology Treatment: Cognitive-Linquistic  Patient Details Name: Micheal Bell MRN: [id number] DOB: [birth year] Today's Date: 11/02/2019 Time: 1410-1430 SLP Time Calculation (min) (ACUTE ONLY): 20 min  Assessment / Plan / Recommendation Clinical Impression  Cognitive intervention focused on executive functioning specifically involving use of provided information to plan and problem solve and process of elimination. He was accurate approximately 40% needing cues mostly items involving  more abstract information.  Pt stated use of writing as a memory strategy. Heard on the phone recalling information learned previously. Pt's anticipatory awareness to overall impairments is decreased and would benefit from planned failure during sessions and self evaluation of efforts. Plans are to transfer to CIR when bed available.     HPI HPI: 49 yo male with hx of HTN, T2DM, gout, IBS, fibromyalgia, obesity who presented on 8/16 for COVID pneumonia; decompensated on 8/19-8/28 (self extubated 8/23 and reintubated same day). Hospitalization was complicated by MSSA and GBS bacteria and found to have systolic heart failure. CXR 9/1 Hypoinflation of the lungs with mild bibasilar subsegmental atelectasis.      SLP Plan  Continue with current plan of care       Recommendations                   General recommendations: Rehab consult Oral Care Recommendations: Oral care BID Follow up Recommendations: Inpatient Rehab SLP Visit Diagnosis: Cognitive communication deficit (E99.371) Plan: Continue with current plan of care       GO                Micheal Bell 11/02/2019, 3:57 PM  Micheal Bell M.Ed Nurse, children's [phone] Office [phone]

## 2019-11-02 NOTE — Progress Notes (Signed)
IP rehab admissions - I received a faxed medicaid decision letter for this patient.  I have given a copy of this letter to Kila, CM.  Currently rehab beds are full.  I have no bed to offer this patient today.  Call me for questions.  [phone]

## 2019-11-03 LAB — CBC WITH DIFFERENTIAL/PLATELET
Abs Immature Granulocytes: 0.08 10*3/uL — ABNORMAL HIGH (ref 0.00–0.07)
Basophils Absolute: 0.1 10*3/uL (ref 0.0–0.1)
Basophils Relative: 1 %
Eosinophils Absolute: 0.4 10*3/uL (ref 0.0–0.5)
Eosinophils Relative: 5 %
HCT: 30.2 % — ABNORMAL LOW (ref 39.0–52.0)
Hemoglobin: 9.6 g/dL — ABNORMAL LOW (ref 13.0–17.0)
Immature Granulocytes: 1 %
Lymphocytes Relative: 31 %
Lymphs Abs: 2.6 10*3/uL (ref 0.7–4.0)
MCH: 29.8 pg (ref 26.0–34.0)
MCHC: 31.8 g/dL (ref 30.0–36.0)
MCV: 93.8 fL (ref 80.0–100.0)
Monocytes Absolute: 0.7 10*3/uL (ref 0.1–1.0)
Monocytes Relative: 8 %
Neutro Abs: 4.8 10*3/uL (ref 1.7–7.7)
Neutrophils Relative %: 54 %
Platelets: 328 10*3/uL (ref 150–400)
RBC: 3.22 MIL/uL — ABNORMAL LOW (ref 4.22–5.81)
RDW: 13.1 % (ref 11.5–15.5)
WBC: 8.6 10*3/uL (ref 4.0–10.5)
nRBC: 0 % (ref 0.0–0.2)

## 2019-11-03 LAB — COMPREHENSIVE METABOLIC PANEL
ALT: 17 U/L (ref 0–44)
AST: 17 U/L (ref 15–41)
Albumin: 2.1 g/dL — ABNORMAL LOW (ref 3.5–5.0)
Alkaline Phosphatase: 85 U/L (ref 38–126)
Anion gap: 10 (ref 5–15)
BUN: <5 mg/dL — ABNORMAL LOW (ref 6–20)
CO2: 24 mmol/L (ref 22–32)
Calcium: 8.8 mg/dL — ABNORMAL LOW (ref 8.9–10.3)
Chloride: 102 mmol/L (ref 98–111)
Creatinine, Ser: 0.7 mg/dL (ref 0.61–1.24)
GFR calc Af Amer: >60 mL/min (ref >60)
GFR calc non Af Amer: >60 mL/min (ref >60)
Glucose, Bld: 105 mg/dL — ABNORMAL HIGH (ref 70–99)
Potassium: 3.6 mmol/L (ref 3.5–5.1)
Sodium: 136 mmol/L (ref 135–145)
Total Bilirubin: 0.8 mg/dL (ref 0.3–1.2)
Total Protein: 6.5 g/dL (ref 6.5–8.1)

## 2019-11-03 LAB — GLUCOSE, CAPILLARY
Glucose-Capillary: 100 mg/dL — ABNORMAL HIGH (ref 70–99)
Glucose-Capillary: 104 mg/dL — ABNORMAL HIGH (ref 70–99)
Glucose-Capillary: 147 mg/dL — ABNORMAL HIGH (ref 70–99)
Glucose-Capillary: 176 mg/dL — ABNORMAL HIGH (ref 70–99)
Glucose-Capillary: 196 mg/dL — ABNORMAL HIGH (ref 70–99)
Glucose-Capillary: 224 mg/dL — ABNORMAL HIGH (ref 70–99)

## 2019-11-03 LAB — MAGNESIUM: Magnesium: 1.4 mg/dL — ABNORMAL LOW (ref 1.7–2.4)

## 2019-11-03 NOTE — Progress Notes (Signed)
PROGRESS NOTE                                                                                                                                                                                                             Patient Demographics:    Micheal Bell, is a 49 y.o. male, DOB - May 24, 1970, ZOX:[id number]  Outpatient Primary MD for the patient is [name], MD    LOS - 22  Admit date - 10/09/2019    Chief Complaint  Patient presents with  . Cough       Brief Narrative - 49 yo male with hx of HTN, T2DM, gout, IBS, fibromyalgia, obesity who presented on 8/16 for COVID pneumonia.  He decompensated on 8/19 and was intubated.  Self extubated on 8/23 and was reintubated.  He's subsequently been extubated on 8/28.  He's been on typical therapies for covid including steroids, baricitinib and remdesivir.  His hospitalization was complicated by MSSA and GBS bacteria.  Also found to have systolic heart failure.    Subjective:   Patient in bed, appears comfortable, denies any headache, no fever, no chest pain or pressure, no shortness of breath , no abdominal pain. No focal weakness.    Assessment  & Plan :   Acute Hypoxic Resp. Failure due to Acute Covid 19 Viral Pneumonitis during the ongoing 2020 Covid 19 Pandemic - he had severe hypoxia and parenchymal lung injury he was intubated and admitted by ICU, he was started on IV steroids, remdesivir and Baricitinib on 10/12/2019.  He was subsequently extubated on 10/21/2019.  His stay was complicated by bacteremia after which Baricitinib was discontinued on 10/24/2019.  His pulmonary status is gradually improving, currently he is stable and symptom-free, he is on room air, I have discussed with him about incentive spirometry, flutter valve, pulmonary toiletry,   Recent Labs  Lab 10/28/19 0453 10/28/19 0453 10/28/19 0454 10/29/19 0409 10/29/19 0410 10/30/19 0320 10/31/19 0411  11/01/19 0152 11/02/19 0342 11/03/19 0356  WBC 10.7*   < >  --  10.5  --   --  8.3 8.7 8.0 8.6  PLT 372   < >  --  363  --   --  338 342 322 328  CRP 8.8*  --   --  8.4*  --   --   --   --   --   --  BNP  --   --  46.0  --  76.9  --   --   --   --   --   DDIMER 5.74*  --   --  5.18*  --   --   --   --   --   --   PROCALCITON <0.10  --   --  <0.10  --  <0.10  --   --   --   --   AST 24   < >  --  21  --   --  18 19 17 17   ALT 18   < >  --  17  --   --  17 18 17 17   ALKPHOS 92   < >  --  86  --   --  85 89 92 85  BILITOT 0.6   < >  --  0.8  --   --  0.7 0.6 0.7 0.8  ALBUMIN 2.1*   < >  --  2.1*  --   --  2.1* 2.0* 2.1* 2.1*   < > = values in this interval not displayed.    Sepsis with MSSA and Group B strep bacteremia due to left foot abscess.  ID following, was on IV Ancef but switch to nafcillin on 10/26/2019.  Clean TEE, history of Charcot joints, peripheral neuropathy and recent left foot soft tissue injury, MRI left foot shows an abscess, Ortho was consulted and they recommended medical management with outpatient follow-up with Dr. [name] post discharge, also has osteomyelitis, defer duration of treatment with antibiotics to ID.  Antibiotics management per ID, plan to continue total of 4 weeks of IV nafcillin, through 9/29, then start Keflex 500 mg 4 times daily, he has a follow-up arranged with Dr. [name] on October 13 at 10:30 AM .  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  For seizures he has been started on Keppra with good effect and his mentation has improved considerably. Continue PT OT and speech, SNF VS CIR .  Neuro following.   Possible systolic  heart failure as shown by TTE however this could have been transient due to sepsis, on repeat TEE his EF is now normalized to 50 to 55%.  He is currently compensated, blood pressure was too low and after hydration has improved, continue beta-blocker.   Cyanotic right lower extremity toes.  Likely due to hypoperfusion which is transient, arterial ultrasound stable. Resolved.   History of chronic pain.  On multiple narcotics, Flexeril and benzodiazepines.  All on hold in the hospital.  HTN - BP was low on multiple blood pressure medications, was hydrated now able to tolerate low-dose beta-blocker.  Elevated D-dimer, positive acute DVT in the right lower extremity.  Switched to full dose Lovenox.   Hypomagnesemia.  Replaced.    DM type II.  Currently on Lantus and sliding scale monitor.  Lab Results  Component Value Date   HGBA1C 11.3 (H) 10/26/2019   CBG (last 3)  Recent Labs    11/02/19 2302 11/03/19 0352 11/03/19 0738  GLUCAP 136* 104* 100*      Condition -  Guarded  Family Communication  :  Sister and mother [phone] - 10/25/19, 10/27/19, 10/28/19, 11/03/19  Code Status :  Full  Consults  :  PCCM, ID, Cards  Procedures  :    EEG - This study showed evidence of potential epileptogenicity arising from bilateral frontocentral, maximal vertex region. The sharp waves are at times rhythmic consistent with brief ictal-interictal rhythmic discharges. Additionally, there is evidence of mild diffuse encephalopathy, nonspecific etiology.  Bilateral lower extremity venous ultrasound - Acute right lower extremity popliteal vein DVT.  Carotid duplex.  Nonacute.    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  CT Head -  1. No acute intracranial abnormality. 2. Area of subcortical low-density involving the posterior left frontal lobe at the convexity, likely encephalomalacia and sequela of  remote infarct. There are no prior exams for comparison to establish chronicity. MRI could be considered for further evaluation based on clinical concern. 3. Paranasal sinus fluid levels on opacification of the right greater than left mastoid air cells, possibly related to intubation.  R. Leg Arterial US -  No evidence of arterial occlusive disease. Normal waveforms, no stenosis  TTE -  1. Extremely poor acoustic windows. LVEF appears to be depressed with diffuse hypokinesis, worse in the mid/distal inferior/inferoseptal walls. . Left ventricular ejection fraction, by estimation, is 30 to 35%. The left ventricle has moderately decreased function. The left ventricular internal cavity size was mildly dilated. Left ventricular diastolic parameters are indeterminate.  2. Right ventricular systolic function is moderately reduced. The right ventricular size is mildly enlarged.  3. The mitral valve is normal in structure. Trivial mitral valve regurgitation.  4. The aortic valve is normal in structure. Aortic valve regurgitation is not visualized  TEE - 1. Left ventricular ejection fraction, by estimation, is 50 to 55%. The left ventricle has low normal function. The left ventricle has no regional wall motion abnormalities.  2. Right ventricular systolic function is normal. The right ventricular size is normal.  3. No left atrial/left atrial appendage thrombus was detected.  4. The mitral valve is normal in structure. Trivial mitral valve regurgitation. No evidence of mitral stenosis.  5. The aortic valve is normal in structure. Aortic valve regurgitation is not visualized. No aortic stenosis is present.  6. The inferior vena cava is normal in size with greater than 50% respiratory variability, suggesting right atrial pressure of 3 mmHg.  CT - 1. No acute intracranial abnormality. 2. Area of subcortical low-density involving the posterior left frontal lobe at the convexity, likely encephalomalacia and sequela of  remote infarct. There are no prior exams for comparison to establish chronicity. MRI could be considered for further evaluation based on clinical concern. 3. Paranasal sinus fluid levels on opacification of the right greater than left mastoid air cells, possibly related to intubation    PUD Prophylaxis : PPI  Disposition Plan  :    Status is: Inpatient  Remains inpatient appropriate because:IV treatments appropriate due to intensity of illness or inability to take PO   Dispo: The patient is from: Home              Anticipated d/c is to: Home              Anticipated d/c date is: 1 day              Patient currently is medically stable to d/c. to CIR when bed is  available.   DVT Prophylaxis  :  Lovenox    Lab Results  Component Value Date   PLT 328 11/03/2019    Diet :  Diet Order            Diet heart healthy/carb modified Room service appropriate? No; Fluid consistency: Thin  Diet effective now                  Inpatient Medications  Scheduled Meds: . alteplase  2 mg Intracatheter Once  . atorvastatin  40 mg Oral Daily  . buPROPion  150 mg Oral Daily  . Chlorhexidine Gluconate Cloth  6 each Topical Daily  . docusate sodium  100 mg Oral BID  . enoxaparin (LOVENOX) injection  110 mg Subcutaneous Q12H  . feeding supplement (ENSURE ENLIVE)  237 mL Oral TID BM  . insulin aspart  0-15 Units Subcutaneous Q4H  . insulin glargine  20 Units Subcutaneous Daily  . levETIRAcetam  500 mg Oral BID  . linagliptin  5 mg Oral Daily  . melatonin  3 mg Oral QHS  . metoprolol tartrate  50 mg Oral BID  . pantoprazole  40 mg Oral Daily  . polyethylene glycol  17 g Oral Daily  . senna  2 tablet Oral BID  . sodium chloride flush  10-40 mL Intracatheter Q12H   Continuous Infusions: . sodium chloride 10 mL/hr at 11/02/19 0600  . nafcillin (NAFCIL) continuous infusion 12 g (11/02/19 1042)   PRN Meds:.sodium chloride, acetaminophen, bisacodyl, cyclobenzaprine,  HYDROcodone-acetaminophen, Ipratropium-Albuterol  Antibiotics  :    Anti-infectives (From admission, onward)   Start     Dose/Rate Route Frequency Ordered Stop   10/31/19 1415  nafcillin 12 g in sodium chloride 0.9 % 500 mL continuous infusion  Status:  Discontinued        12 g 20.8 mL/hr over 24 Hours Intravenous Every 24 hours 10/31/19 0608 10/31/19 0609   10/29/19 1415  nafcillin 12 g in dextrose 5 % 500 mL IVPB  Status:  Discontinued        12 g 20.8 mL/hr over 24 Hours Intravenous Every 24 hours 10/29/19 0434 10/29/19 0507   10/29/19 1415  nafcillin 12 g in sodium chloride 0.9 % 500 mL continuous infusion        12 g 20.8 mL/hr over 24 Hours Intravenous Every 24 hours 10/29/19 0507     10/26/19 1415  nafcillin 12 g in dextrose 5 % 500 mL IVPB  Status:  Discontinued        12 g 20.8 mL/hr over 24 Hours Intravenous Every 24 hours 10/26/19 1414 10/29/19 0434   10/26/19 1400  nafcillin injection 12 g  Status:  Discontinued        12 g Intravenous Daily 10/26/19 1238 10/26/19 1242   10/26/19 1400  nafcillin 12 g in dextrose 5 % 50 mL IVPB  Status:  Discontinued        12 g 100 mL/hr over 30 Minutes Intravenous Every 24 hours 10/26/19 1242 10/26/19 1339   10/26/19 1400  nafcillin 12 g in dextrose 5 % 500 mL IVPB  Status:  Discontinued        12 g 20.8 mL/hr over 24 Hours Intravenous Every 24 hours 10/26/19 1337 10/26/19 1416   10/26/19 1200  nafcillin 2 g in sodium chloride 0.9 % 100 mL IVPB  Status:  Discontinued        2 g 200 mL/hr over 30 Minutes Intravenous Every 4 hours 10/26/19 1010 10/26/19  1238   10/12/19 2200  cefTRIAXone (ROCEPHIN) 2 g in sodium chloride 0.9 % 100 mL IVPB  Status:  Discontinued        2 g 200 mL/hr over 30 Minutes Intravenous Every 24 hours 10/12/19 0441 10/12/19 0921   10/12/19 0930  ceFAZolin (ANCEF) IVPB 2g/100 mL premix  Status:  Discontinued        2 g 200 mL/hr over 30 Minutes Intravenous Every 8 hours 10/12/19 0921 10/26/19 1010   10/11/19 1000   remdesivir 100 mg in sodium chloride 0.9 % 100 mL IVPB       "Followed by" Linked Group Details   100 mg 200 mL/hr over 30 Minutes Intravenous Daily 10/10/19 0050 10/14/19 0956   10/11/19 0800  vancomycin (VANCOCIN) IVPB 1000 mg/200 mL premix  Status:  Discontinued        1,000 mg 200 mL/hr over 60 Minutes Intravenous Every 8 hours 10/10/19 2239 10/12/19 0921   10/10/19 2230  vancomycin (VANCOCIN) IVPB 1000 mg/200 mL premix        1,000 mg 200 mL/hr over 60 Minutes Intravenous Every 1 hr x 2 10/10/19 2208 10/11/19 0052   10/10/19 2215  cefTRIAXone (ROCEPHIN) 2 g in sodium chloride 0.9 % 100 mL IVPB  Status:  Discontinued        2 g 200 mL/hr over 30 Minutes Intravenous Every 24 hours 10/10/19 2205 10/12/19 0441   10/10/19 0100  remdesivir 100 mg in sodium chloride 0.9 % 100 mL IVPB       "Followed by" Linked Group Details   100 mg 200 mL/hr over 30 Minutes Intravenous Every 30 min 10/10/19 0050 10/10/19 0332        Susa Raring M.D on 11/03/2019 at 11:09 AM  To page go to [email or web address] -  [facility] -  Office  [phone]   See all Orders from today for further details    Objective:   Vitals:   11/03/19 0400 11/03/19 0623 11/03/19 0736 11/03/19 0802  BP: 111/72   132/66  Pulse: 82 71  77  Resp: 20  20 20   Temp: 97.9 F (36.6 C)  98.1 F (36.7 C) 98.2 F (36.8 C)  TempSrc: Axillary  Oral Oral  SpO2: 94% 94%  92%  Weight:  72.4 kg    Height:        Wt Readings from Last 3 Encounters:  11/03/19 72.4 kg  09/11/19 111.1 kg  05/12/17 (!) 145.2 kg     Intake/Output Summary (Last 24 hours) at 11/03/2019 1109 Last data filed at 11/03/2019 0653 Gross per 24 hour  Intake 599.61 ml  Output 1850 ml  Net -1250.39 ml     Physical Exam  Awake Alert, No new F.N deficits, chronic bilateral rotator cuff injury induced bilateral upper extremity weakness in the deltoid area, per patient this is old and unchanged.  [place].AT,PERRAL Supple Neck,No JVD, No cervical  lymphadenopathy appriciated.  Symmetrical Chest wall movement, Good air movement bilaterally, CTAB RRR,No Gallops, Rubs or new Murmurs, No Parasternal Heave +ve B.Sounds, Abd Soft, No tenderness, No organomegaly appriciated, No rebound - guarding or rigidity. No Cyanosis, Clubbing or edema,     Data Review:    CBC Recent Labs  Lab 10/29/19 0409 10/31/19 0411 11/01/19 0152 11/02/19 0342 11/03/19 0356  WBC 10.5 8.3 8.7 8.0 8.6  HGB 9.7* 10.1* 9.7* 9.7* 9.6*  HCT 30.8* 30.5* 30.8* 30.5* 30.2*  PLT 363 338 342 322 328  MCV 93.1 91.9 93.6 93.8 93.8  MCH  29.3 30.4 29.5 29.8 29.8  MCHC 31.5 33.1 31.5 31.8 31.8  RDW 12.4 12.5 12.7 12.9 13.1  LYMPHSABS 2.8 2.3 2.5 2.6 2.6  MONOABS 0.6 0.6 0.5 0.6 0.7  EOSABS 0.2 0.3 0.3 0.4 0.4  BASOSABS 0.0 0.0 0.0 0.0 0.1    Recent Labs  Lab 10/28/19 0453 10/28/19 0453 10/28/19 0454 10/29/19 0409 10/29/19 0410 10/30/19 0320 10/31/19 0411 11/01/19 0152 11/02/19 0342 11/03/19 0356  NA 132*   < >  --  134*  --   --  134* 133* 135 136  K 3.5   < >  --  3.7  --   --  3.7 3.6 3.8 3.6  CL 99   < >  --  100  --   --  98 97* 98 102  CO2 25   < >  --  26  --   --  GLUCOSE 205*   < >  --  145*  --   --  139* 228* 167* 105*  BUN 8   < >  --  7  --   --  6 6 5* <5*  CREATININE 0.75   < >  --  0.67  --   --  0.59* 0.62 0.74 0.70  CALCIUM 8.3*   < >  --  8.5*  --   --  8.9 8.5* 8.7* 8.8*  AST 24   < >  --  21  --   --  ALT 18   < >  --  17  --   --  ALKPHOS 92   < >  --  86  --   --  85 89 92 85  BILITOT 0.6   < >  --  0.8  --   --  0.7 0.6 0.7 0.8  ALBUMIN 2.1*   < >  --  2.1*  --   --  2.1* 2.0* 2.1* 2.1*  MG 1.4*   < >  --  1.4*  --   --  1.3* 1.5* 1.8 1.4*  CRP 8.8*  --   --  8.4*  --   --   --   --   --   --   DDIMER 5.74*  --   --  5.18*  --   --   --   --   --   --   PROCALCITON <0.10  --   --  <0.10  --  <0.10  --   --   --   --   BNP  --   --  46.0  --  76.9  --   --   --   --   --    < > =  values in this interval not displayed.    ------------------------------------------------------------------------------------------------------------------ No results for input(s): CHOL, HDL, LDLCALC, TRIG, CHOLHDL, LDLDIRECT in the last 72 hours.  Lab Results  Component Value Date   HGBA1C 11.3 (H) 10/26/2019   ------------------------------------------------------------------------------------------------------------------ No results for input(s): TSH, T4TOTAL, T3FREE, THYROIDAB in the last 72 hours.  Invalid input(s): FREET3  Cardiac Enzymes No results for input(s): CKMB, TROPONINI, MYOGLOBIN in the last 168 hours.  Invalid input(s): CK ------------------------------------------------------------------------------------------------------------------    Component Value Date/Time   BNP 76.9 10/29/2019 0410    Micro Results No results found for this or any previous visit (from the past 240 hour(s)).  Radiology Reports CT HEAD WO CONTRAST  Result Date: 10/19/2019 CLINICAL DATA:  Deliriums.  Mental status change. EXAM: CT HEAD WITHOUT CONTRAST TECHNIQUE: Contiguous axial images were obtained from the base of the skull through the vertex without intravenous contrast. COMPARISON:  None. FINDINGS: Brain: Area subcortical low-density involving the posterior left frontal lobe at the convexity, series 3, image 25 and series 5, image 49, likely encephalomalacia and sequela of remote infarct, however nonspecific. No hemorrhage. No evidence of acute ischemia. No hydrocephalus, midline shift or mass effect. No subdural or extra-axial collection. Vascular: Atherosclerosis of skullbase vasculature without hyperdense vessel or abnormal calcification. Skull: No fracture or focal lesion. Sinuses/Orbits: Nasogastric tube in place. Fluid levels in the sphenoid sinuses and right maxillary sinus with scattered opacification of ethmoid air cells may be related to intubation. Opacification of the right  greater than left mastoid air cells. Other: None. IMPRESSION: 1. No acute intracranial abnormality. 2. Area of subcortical low-density involving the posterior left frontal lobe at the convexity, likely encephalomalacia and sequela of remote infarct. There are no prior exams for comparison to establish chronicity. MRI could be considered for further evaluation based on clinical concern. 3. Paranasal sinus fluid levels on opacification of the right greater than left mastoid air cells, possibly related to intubation. Electronically Signed   By: Narda Rutherford M.D.   On: 10/19/2019 15:30   CT ANGIO CHEST PE W OR WO CONTRAST  Result Date: 10/23/2019 CLINICAL DATA:  COVID pneumonia, acute hypoxic respiratory failure, positive D-dimer EXAM: CT ANGIOGRAPHY CHEST WITH CONTRAST TECHNIQUE: Multidetector CT imaging of the chest was performed using the standard protocol during bolus administration of intravenous contrast. Multiplanar CT image reconstructions and MIPs were obtained to evaluate the vascular anatomy. CONTRAST:  75mL OMNIPAQUE IOHEXOL 350 MG/ML SOLN COMPARISON:  None. FINDINGS: Cardiovascular: Satisfactory opacification of the pulmonary arteries to the segmental level. No evidence of pulmonary embolism. The central pulmonary arteries are of normal caliber. There is moderate coronary artery calcification. Normal heart size. No pericardial effusion. The thoracic aorta is unremarkable save for bovine arch anatomy. Right upper extremity PICC line tip is seen within the superior vena cava. Mediastinum/Nodes: No enlarged mediastinal, hilar, or axillary lymph nodes. Thyroid gland, trachea, and esophagus demonstrate no significant findings. Nasoenteric feeding tube extends into the upper abdomen beyond the margin of the examination. Lungs/Pleura: Lung volumes are small. Evaluation is limited by motion artifact, however, multifocal pulmonary infiltrates are identified demonstrating a peripheral and basal predominance,  likely infectious or inflammatory in the acute setting. No central obstructing lesion. No pneumothorax or pleural effusion. No superimposed interstitial edema. Upper Abdomen: No acute abnormality. Musculoskeletal: No chest wall abnormality. No acute or significant osseous findings. Review of the MIP images confirms the above findings. IMPRESSION: 1. No evidence of pulmonary embolism. 2. Multifocal pulmonary infiltrates are identified demonstrating a peripheral and basal predominance, likely infectious or inflammatory in the acute setting. 3. Moderate coronary artery calcification. Electronically Signed   By: Helyn Numbers MD   On: 10/23/2019 16:25   MR ANGIO HEAD WO CONTRAST  Result Date: 10/29/2019 CLINICAL DATA:  Neuro deficit with stroke suspected EXAM: MRA HEAD WITHOUT CONTRAST TECHNIQUE: Angiographic images of the Circle of Willis were obtained using MRA technique without intravenous contrast. COMPARISON:  Four days ago FINDINGS: History of bacteremia and embolic infarction. No vessel beading, stenosis, or pseudoaneurysm. IMPRESSION: Negative intracranial MRA. Electronically Signed   By: Marnee Spring M.D.   On: 10/29/2019 12:04   MR BRAIN WO CONTRAST  Result Date: 10/25/2019 CLINICAL DATA:  Neuro deficit,  acute, stroke suspected. Additional history provided: COVID positive. Additional history obtained from electronic MEDICAL RECORD NUMBERSepsis with MSSA and group B strep bacteremia. EXAM: MRI HEAD WITHOUT CONTRAST TECHNIQUE: Multiplanar, multiecho pulse sequences of the brain and surrounding structures were obtained without intravenous contrast. COMPARISON:  Head CT 10/19/2019. FINDINGS: Brain: The examination is limited by poor signal on several sequences as well as motion degradation. Most notably, there is moderate motion degradation of the axial T2 weighted sequence, severe motion degradation of the axial SWI sequence and moderate motion degradation of the coronal T2 weighted sequence. Cerebral volume  is normal for age. There is a 17 mm focus of restricted diffusion and corresponding T2/FLAIR hyperintensity within the subcortical left frontoparietal lobes (for instance as seen on series 2, image 39) (series 3, image 17). Additional mild scattered T2/FLAIR hyperintensity within the cerebral white matter and pons is nonspecific, but consistent with chronic small vessel ischemic disease. Severe motion degradation of the axial SWI sequence precludes adequate evaluation for intracranial blood products. No extra-axial fluid collection. No midline shift. Vascular: Expected proximal arterial flow voids. Skull and upper cervical spine: No focal marrow lesion is identified within described limitations. Sinuses/Orbits: Visualized orbits show no acute finding. Air-fluid levels within the sphenoid and right maxillary sinuses. Partial opacification of right ethmoid air cells. Bilateral mastoid effusions. These results will be called to the ordering clinician or representative by the Radiologist Assistant, and communication documented in the PACS or [employer]. IMPRESSION: Examination limited by poor signal on several sequences as well as motion degradation, as described. 17 mm focus of restricted diffusion and T2/FLAIR hyperintensity within the subcortical left frontoparietal lobes. This finding is nonspecific, but given the patient's history, the primary differential considerations are acute/early subacute infarct versus cerebritis. Consider contrast-enhanced MR imaging of the brain for further evaluation of this lesion. Mild chronic small vessel ischemic disease. Paranasal sinus disease with air-fluid levels. Bilateral mastoid effusions. Electronically Signed   By: Jackey Loge DO   On: 10/25/2019 15:16   MR BRAIN W CONTRAST  Result Date: 10/25/2019 CLINICAL DATA:  Brain mass follow-up EXAM: MRI HEAD WITH CONTRAST TECHNIQUE: Multiplanar, multiecho pulse sequences of the brain and surrounding structures were obtained  with intravenous contrast. CONTRAST:  10mL GADAVIST GADOBUTROL 1 MMOL/ML IV SOLN COMPARISON:  Brain MRI without contrast 10/25/2019 FINDINGS: Brain: There is a peripherally enhancing lesion at the base of the left precentral gyrus that measures 0.9 x 0.7 x 1.8 cm. There are no other areas of abnormal contrast enhancement. 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 Electronically Signed   By: Deatra Robinson M.D.   On: 10/25/2019 22:47   MR FOOT LEFT WO CONTRAST  Result Date: 10/25/2019 CLINICAL DATA:  Bacteremia left foot pain and diabetic EXAM: MRI OF THE LEFT FOOT WITHOUT CONTRAST TECHNIQUE: Multiplanar, multisequence MR imaging of the left was performed. No intravenous contrast was administered. COMPARISON:  October 23, 2019 FINDINGS: Bones/Joint/Cartilage Mildly angulated nondisplaced fractures of the midshaft of the fourth and fifth metatarsals are noted. There is periosteal reaction seen along the medial margins of the metatarsal shafts. Increased heterogeneous T2 hyperintense signal with subtle T1 hypointensity seen at the base of the fourth and fifth metatarsals. No other osseous marrow signal abnormality is seen. There is no large knee joint effusions noted. The articular surfaces appear to be maintained. Ligaments The Lisfranc ligament is intact. Muscles and Tendons Increased feathery signal with atrophy is seen throughout  the muscles of the forefoot. There is a small amount of fluid seen surrounding the posterior tibialis tendon. The remainder of the flexor and extensor tendons are intact. Soft tissues Lateral plantar surface of the forefoot overlying the fifth metatarsal base there is a focal area of ulceration measuring approximately 8 mm in transverse dimension. A fluid-filled sinus tract is seen extending to the overlying osseous surface. There is a multilocular fluid collection which extends around the  dorsal surface of the fifth metatarsal measuring approximately 5.4 x 0.9 x 3.4 cm. There is extensive dorsal and lateral subcutaneous edema and skin thickening. IMPRESSION: Superficial area of ulceration over the plantar lateral aspect of the fifth metatarsal base with a fluid-filled sinus tract and loculated probable abscess extending over the dorsal surface of the fifth metatarsal measuring 5.4 x 0.9 by is a 3.4 cm. Incomplete mildly angulated fourth and fifth metatarsal shaft fractures with periosteal reaction Findings which could be suggestive of reactive marrow versus early osteomyelitis involving the base of the fourth and fifth metatarsals. Electronically Signed   By: Jonna Clark M.D.   On: 10/25/2019 19:06   DG Chest Port 1 View  Result Date: 10/26/2019 CLINICAL DATA:  Short of breath.  COVID positive EXAM: PORTABLE CHEST 1 VIEW COMPARISON:  10/25/2019 FINDINGS: Hypoventilation with decreased lung volume. Mild bibasilar airspace disease unchanged. No new infiltrate or effusion. Right arm PICC tip in the SVC unchanged. IMPRESSION: Hypoventilation with bibasilar mild airspace disease unchanged. Electronically Signed   By: Marlan Palau M.D.   On: 10/26/2019 16:10   DG Chest Port 1 View  Result Date: 10/25/2019 CLINICAL DATA:  Shortness of breath. EXAM: PORTABLE CHEST 1 VIEW COMPARISON:  October 16, 2019. FINDINGS: The heart size and mediastinal contours are within normal limits. Hypoinflation of the lungs is noted with mild bibasilar subsegmental atelectasis. Endotracheal tube has been removed. Right-sided PICC line is unchanged in position. The visualized skeletal structures are unremarkable. IMPRESSION: Hypoinflation of the lungs with mild bibasilar subsegmental atelectasis. Electronically Signed   By: Lupita Raider M.D.   On: 10/25/2019 08:37   DG CHEST PORT 1 VIEW  Result Date: 10/16/2019 CLINICAL DATA:  Status post intubation. EXAM: PORTABLE CHEST 1 VIEW COMPARISON:  10/14/2019 FINDINGS: ET  tube tip is above the carina. There is a right arm PICC line with tip at the cavoatrial junction. Lung volumes are low. Similar appearance of bilateral pulmonary opacities compatible with multifocal pneumonia. IMPRESSION: 1. Stable support apparatus. 2. Persistent bilateral pulmonary opacities compatible with multifocal pneumonia. Electronically Signed   By: Signa Kell M.D.   On: 10/16/2019 06:14   DG Chest Port 1 View  Result Date: 10/15/2019 CLINICAL DATA:  49 year old male with history of COVID infection. EXAM: PORTABLE CHEST 1 VIEW COMPARISON:  Chest x-ray 10/14/2019. FINDINGS: An endotracheal tube is in place with tip 3.6 cm above the carina. A nasogastric tube is seen extending into the stomach, however, the tip of the nasogastric tube extends below the lower margin of the image. There is a right upper extremity PICC with tip terminating in the right atrium. Lung volumes are low. Widespread patchy areas of interstitial prominence an ill-defined airspace disease again noted throughout the lungs bilaterally. Overall, aeration is essentially unchanged. No pleural effusions. No evidence of pulmonary edema. No pneumothorax. Heart size is normal. Upper mediastinal contours are within normal limits. IMPRESSION: 1. Support apparatus, as above. 2. The appearance the chest is compatible with multilobar pneumonia from reported COVID infection. Electronically Signed   By:  Trudie Reed M.D.   On: 10/15/2019 05:39   DG Chest Port 1 View  Result Date: 10/14/2019 CLINICAL DATA:  COVID-19, ARDS EXAM: PORTABLE CHEST 1 VIEW COMPARISON:  10/13/2019 FINDINGS: Single frontal view of the chest demonstrates stable position of the endotracheal tube and enteric catheter. There is a right-sided PICC tip overlying superior vena cava. The cardiac silhouette is stable. Interstitial and ground-glass opacities are seen throughout the lungs bilaterally, unchanged. Lung volumes are diminished. No effusion or pneumothorax.  IMPRESSION: 1. 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 2. Stable support devices. Electronically Signed   By: Sharlet Salina M.D.   On: 10/14/2019 15:57   DG Chest Port 1 View  Result Date: 10/13/2019 CLINICAL DATA:  Acute respiratory failure with hypoxemia, COVID positive, asthma, diabetes mellitus, hypertension, CHF EXAM: PORTABLE CHEST 1 VIEW COMPARISON:  Portable exam 1021 hours compared to 10/12/2019 FINDINGS: Tip of endotracheal tube projects 3.7 cm above carina. Nasogastric tube extends into stomach. RIGHT arm PICC line tip projects over cavoatrial junction. Stable heart size mediastinal contours for technique. Patchy BILATERAL airspace infiltrates consistent with multifocal pneumonia, minimally improved. No pleural effusion or pneumothorax. IMPRESSION: Minimally improved pulmonary infiltrates. Electronically Signed   By: Ulyses Southward M.D.   On: 10/13/2019 10:37   DG CHEST PORT 1 VIEW  Result Date: 10/12/2019 CLINICAL DATA:  Intubation EXAM: PORTABLE CHEST 1 VIEW COMPARISON:  Earlier today FINDINGS: New endotracheal tube with tip at the clavicular heads, measuring 3 cm above the carina. Low volume chest with severe airspace disease. No visible effusion or air leak. Stable heart size which is distorted by positioning and volumes. IMPRESSION: 1. Unremarkable positioning of the endotracheal tube. 2. Stable extensive airspace disease with low lung volumes. Electronically Signed   By: Marnee Spring M.D.   On: 10/12/2019 07:13   DG CHEST PORT 1 VIEW  Result Date: 10/12/2019 CLINICAL DATA:  Shortness of breath EXAM: PORTABLE CHEST 1 VIEW COMPARISON:  Three days ago FINDINGS: Very low volume chest with diffuse and patchy pulmonary opacity. Vascular pedicle widening and prominent heart size largely related to technique. No visible effusion or pneumothorax. IMPRESSION: Worsening multifocal pneumonia.  Lung volumes  remain very low. Electronically Signed   By: Marnee Spring M.D.   On: 10/12/2019 04:07   DG Chest Portable 1 View  Result Date: 10/09/2019 CLINICAL DATA:  Cough, fever, shortness of breath EXAM: PORTABLE CHEST 1 VIEW COMPARISON:  None. FINDINGS: Single frontal view of the chest demonstrates unremarkable cardiac silhouette. The lung volumes are diminished, with patchy bilateral areas of faint ground-glass airspace disease consistent with multifocal pneumonia. No effusion or pneumothorax. IMPRESSION: 1. Patchy bilateral ground-glass airspace disease consistent with multifocal pneumonia. Electronically Signed   By: Sharlet Salina M.D.   On: 10/09/2019 19:32   DG Abd Portable 1V  Result Date: 10/20/2019 CLINICAL DATA:  GI problem. EXAM: PORTABLE ABDOMEN - 1 VIEW COMPARISON:  10/16/2019 FINDINGS: Feeding tube tip is in the distal stomach or proximal duodenum. Mild gaseous distention of the stomach. Gas throughout nondistended large and small bowel. No evidence of bowel obstruction, organomegaly or free air. IMPRESSION: Feeding tube tip in the distal stomach or proximal duodenum. No evidence of obstruction or free air. Electronically Signed   By: Charlett Nose M.D.   On: 10/20/2019 21:28   DG Abd Portable 1V  Result Date: 10/16/2019 CLINICAL DATA:  Status post OG tube placement EXAM: PORTABLE ABDOMEN - 1 VIEW  COMPARISON:  10/16/2019 FINDINGS: The enteric tube tip projects over the distal body of stomach in the side port is below the level of the GE junction. A few air-filled loops of small bowel are noted within the upper abdomen which measure up to 2.8 cm. IMPRESSION: Satisfactory position of enteric tube. Electronically Signed   By: Signa Kell M.D.   On: 10/16/2019 06:52   DG Abd Portable 1V  Result Date: 10/12/2019 CLINICAL DATA:  Onychogryphosis EXAM: PORTABLE ABDOMEN - 1 VIEW COMPARISON:  Portable exam 0830 hours without priors for comparison FINDINGS: Tip of nasogastric tube projects over distal  antrum. Nonobstructive bowel gas pattern. No bowel dilatation or bowel wall thickening. Patchy bibasilar infiltrates. No acute osseous findings. IMPRESSION: Tip of nasogastric tube projects over distal gastric antrum. Nonobstructive bowel gas pattern. Bibasilar pulmonary infiltrates. Electronically Signed   By: Ulyses Southward M.D.   On: 10/12/2019 08:51   DG Foot 2 Views Left  Result Date: 10/23/2019 CLINICAL DATA:  Bacteremia, LEFT foot pain and diabetic. EXAM: LEFT FOOT - 2 VIEW COMPARISON:  10/13/2019 and prior radiographs FINDINGS: Mildly angulated fractures of the 4th and 5th metatarsals are again noted with fracture lines still evident. Healing changes along these fractures are noted. Overlying soft tissue swelling is noted. No new fracture, subluxation or dislocation noted. No definite radiographic evidence of acute osteomyelitis noted. IMPRESSION: Mildly angulated healing fractures of the 4th and 5th metatarsals with soft tissue swelling again noted but without definite radiographic evidence of acute osteomyelitis. Consider MRI for further evaluation as clinically indicated. Electronically Signed   By: Harmon Pier M.D.   On: 10/23/2019 13:40   DG Foot 2 Views Left  Result Date: 10/13/2019 CLINICAL DATA:  49 year old male with diabetic foot. EXAM: LEFT FOOT - 2 VIEW COMPARISON:  Left foot radiograph dated 09/11/2019. FINDINGS: Evaluation is limited due to positioning. Fractures of the midportion of the fourth and fifth metacarpal with interval progression of bridging callus formation. No new fracture identified. There is no dislocation. There is diffuse soft tissue swelling of the foot. No radiopaque foreign object or soft tissue gas. IMPRESSION: 1. Healing fractures of the fourth and fifth metacarpal. 2. Diffuse soft tissue swelling. No radiopaque foreign object or soft tissue gas. Electronically Signed   By: Elgie Collard M.D.   On: 10/13/2019 16:49   EEG adult  Result Date: 10/27/2019 Charlsie Quest, MD     10/28/2019 10:13 AM Patient Name: FABRIZIO FILIP MRN: [id number] Epilepsy Attending: Charlsie Quest Referring Physician/Provider: Dr. [name] Date: 10/27/2019 Duration: 22.22 minutes Patient history: 49 year old male with altered mental status. EEG evaluate for seizures. Level of alertness: Awake, asleep AEDs during EEG study: Keppra Technical aspects: This EEG study was done with scalp electrodes positioned according to the 10-20 International system of electrode placement. Electrical activity was acquired at a sampling rate of 500Hz  and reviewed with a high frequency filter of 70Hz  and a low frequency filter of 1Hz . EEG data were recorded continuously and digitally stored. Description: The posterior dominant rhythm consists of 8 Hz activity of moderate voltage (25-35 uV) seen predominantly in posterior head regions, symmetric and reactive to eye opening and eye closing. Sleep was characterized by vertex waves, sleep spindles (12 to 14 Hz), maximal frontocentral region. Frequent runs of sharp waves were seen in frontocentral region, maximal vertex lasting about 5 to 6 seconds consistent with brief ictal-interictal rhythmic discharges. Continuous generalized 5 to 6 Hz theta slowing was also noted. Hyperventilation and photic stimulation  were not performed.   ABNORMALITY -Continuous slow, generalized -Brief ictal-interictal rhythmic discharges, bilateral frontocentral region, maximal vertex IMPRESSION: This study showed evidence of potential epileptogenicity arising from bilateral frontocentral, maximal vertex region. The sharp waves are at times rhythmic consistent with brief ictal-interictal rhythmic discharges. Additionally, there is evidence of mild diffuse encephalopathy, nonspecific etiology. Priyanka [name]   Overnight EEG with video  Result Date: 10/28/2019 Charlsie Quest, MD     10/29/2019  8:27 AM Patient Name: KHING BELCHER MRN: [id number] Epilepsy Attending: Charlsie Quest  Referring Physician/Provider: Dr. [name] Duration: 10/27/2019 1145 to 10/28/2019 1145  Patient history: 49 year old male with altered mental status. EEG evaluate for seizures.  Level of alertness: Awake, asleep AEDs during EEG study: Keppra  Technical aspects: This EEG study was done with scalp electrodes positioned according to the 10-20 International system of electrode placement. Electrical activity was acquired at a sampling rate of 500Hz  and reviewed with a high frequency filter of 70Hz  and a low frequency filter of 1Hz . EEG data were recorded continuously and digitally stored.  Description: The posterior dominant rhythm consists of 8 Hz activity of moderate voltage (25-35 uV) seen predominantly in posterior head regions, symmetric and reactive to eye opening and eye closing. Sleep was characterized by vertex waves, sleep spindles (12 to 14 Hz), maximal frontocentral region. Initially, frequent runs of sharp waves were seen in frontocentral region, maximal vertex lasting about 5 to 6 seconds consistent with brief ictal-interictal rhythmic discharges. Continuous generalized 5 to 6 Hz theta slowing as well as intermittent generalized rhythmic 2-3hz  delta slowing  was also noted. Hyperventilation and photic stimulation were not performed.    ABNORMALITY -Continuous slow, generalized - Intermittent rhythmic delta slowing, generalized -Brief ictal-interictal rhythmic discharges, bilateral frontocentral region, maximal vertex  IMPRESSION: This study initially showed evidence of potential epileptogenicity arising from bilateral frontocentral, maximal vertex region. The sharp waves are at times rhythmic consistent with brief ictal-interictal rhythmic discharges. Additionally, there is evidence of mild diffuse encephalopathy, nonspecific etiology. No seizures were seen during thi study. EEG appears to be improving compared to previous day.  Charlsie Quest   ECHOCARDIOGRAM COMPLETE  Result Date:  10/12/2019    ECHOCARDIOGRAM REPORT   Patient Name:   LAKODA MCANANY Date of Exam: 10/12/2019 Medical Rec #:  [id number]      Height:       74.0 in Accession #:    [id number]     Weight:       245.0 lb Date of Birth:  Jan 16, 1971      BSA:          2.369 m Patient Age:    49 years       BP:           111/74 mmHg Patient Gender: M              HR:           75 bpm. Exam Location:  Inpatient Procedure: 2D Echo Indications:    CHF 428                  & Bacteremia 790.7  History:        Patient has no prior history of Echocardiogram examinations.                 CHF; Risk Factors:Hypertension and Diabetes. Covid +.  Sonographer:    Celene Skeen RDCS (AE) Referring Phys: [id number] Clarene Critchley [facility]  Sonographer Comments: Echo performed  with patient supine and on artificial respirator. Image acquisition challenging due to respiratory motion and Image acquisition challenging due to patient body habitus. restricted mobility IMPRESSIONS  1. Extremely poor acoustic windows. LVEF appears to be depressed with diffuse hypokinesis, worse in the mid/distal inferior/inferoseptal walls. . Left ventricular ejection fraction, by estimation, is 30 to 35%. The left ventricle has moderately decreased function. The left ventricular internal cavity size was mildly dilated. Left ventricular diastolic parameters are indeterminate.  2. Right ventricular systolic function is moderately reduced. The right ventricular size is mildly enlarged.  3. The mitral valve is normal in structure. Trivial mitral valve regurgitation.  4. The aortic valve is normal in structure. Aortic valve regurgitation is not visualized. FINDINGS  Left Ventricle: Extremely poor acoustic windows. LVEF appears to be depressed with diffuse hypokinesis, worse in the mid/distal inferior/inferoseptal walls. Left ventricular ejection fraction, by estimation, is 30 to 35%. The left ventricle has moderately decreased function. The left ventricle demonstrates regional wall motion  abnormalities. The left ventricular internal cavity size was mildly dilated. There is no left ventricular hypertrophy. Left ventricular diastolic parameters are indeterminate. Right Ventricle: The right ventricular size is mildly enlarged. Right vetricular wall thickness was not assessed. Right ventricular systolic function is moderately reduced. Left Atrium: Left atrial size was normal in size. Right Atrium: Right atrial size was normal in size. Pericardium: There is no evidence of pericardial effusion. Mitral Valve: The mitral valve is normal in structure. Trivial mitral valve regurgitation. Tricuspid Valve: The tricuspid valve is normal in structure. Tricuspid valve regurgitation is trivial. Aortic Valve: The aortic valve is normal in structure. Aortic valve regurgitation is not visualized. Pulmonic Valve: The pulmonic valve was grossly normal. Pulmonic valve regurgitation is trivial. Aorta: The aortic root is normal in size and structure. IAS/Shunts: The interatrial septum was not assessed.  LEFT VENTRICLE PLAX 2D LVIDd:         5.60 cm  Diastology LVIDs:         3.90 cm  LV e' lateral:   8.81 cm/s LV PW:         1.40 cm  LV E/e' lateral: 5.7 LV IVS:        1.10 cm  LV e' medial:    5.87 cm/s LVOT diam:     2.40 cm  LV E/e' medial:  8.5 LV SV:         64 LV SV Index:   27 LVOT Area:     4.52 cm  RIGHT VENTRICLE RV S prime:     9.79 cm/s TAPSE (M-mode): 1.3 cm LEFT ATRIUM             Index       RIGHT ATRIUM           Index LA diam:        3.10 cm 1.31 cm/m  RA Area:     18.80 cm LA Vol (A2C):   62.9 ml 26.55 ml/m RA Volume:   51.80 ml  21.86 ml/m LA Vol (A4C):   40.8 ml 17.22 ml/m LA Biplane Vol: 50.8 ml 21.44 ml/m  AORTIC VALVE LVOT Vmax:   69.80 cm/s LVOT Vmean:  49.400 cm/s LVOT VTI:    0.142 m  AORTA Ao Root diam: 3.60 cm MITRAL VALVE MV Area (PHT): 2.34 cm    SHUNTS MV Decel Time: 324 msec    Systemic VTI:  0.14 m MV E velocity: 50.10 cm/s  Systemic Diam: 2.40 cm MV A velocity: 71.60 cm/s MV  E/A  ratio:  0.70 Dietrich Pates MD Electronically signed by Dietrich Pates MD Signature Date/Time: 10/12/2019/1:58:41 PM    Final    ECHO TEE  Result Date: 10/24/2019    TRANSESOPHOGEAL ECHO REPORT   Patient Name:   MACRAE WIEGMAN Date of Exam: 10/24/2019 Medical Rec #:  [id number]      Height:       74.0 in Accession #:    [id number]     Weight:       247.4 lb Date of Birth:  1970-06-13      BSA:          2.379 m Patient Age:    49 years       BP:           120/80 mmHg Patient Gender: M              HR:           100 bpm. Exam Location:  Inpatient Procedure: Transesophageal Echo Indications:    Emobli/Bacteremia  History:        Patient has prior history of Echocardiogram examinations.  Sonographer:    Ross Ludwig RDCS (AE) Referring Phys: [phone] HAO MENG PROCEDURE: The transesophogeal probe was passed without difficulty through the esophogus of the patient. Sedation performed by different physician. The patient's vital signs; including heart rate, blood pressure, and oxygen saturation; remained stable throughout the procedure. The patient developed no complications during the procedure. IMPRESSIONS  1. Left ventricular ejection fraction, by estimation, is 50 to 55%. The left ventricle has low normal function. The left ventricle has no regional wall motion abnormalities.  2. Right ventricular systolic function is normal. The right ventricular size is normal.  3. No left atrial/left atrial appendage thrombus was detected.  4. The mitral valve is normal in structure. Trivial mitral valve regurgitation. No evidence of mitral stenosis.  5. The aortic valve is normal in structure. Aortic valve regurgitation is not visualized. No aortic stenosis is present.  6. The inferior vena cava is normal in size with greater than 50% respiratory variability, suggesting right atrial pressure of 3 mmHg. Conclusion(s)/Recommendation(s): Normal biventricular function without evidence of hemodynamically significant valvular heart disease. FINDINGS   Left Ventricle: Left ventricular ejection fraction, by estimation, is 50 to 55%. The left ventricle has low normal function. The left ventricle has no regional wall motion abnormalities. The left ventricular internal cavity size was normal in size. There is no left ventricular hypertrophy. Right Ventricle: The right ventricular size is normal. No increase in right ventricular wall thickness. Right ventricular systolic function is normal. Left Atrium: Left atrial size was normal in size. No left atrial/left atrial appendage thrombus was detected. Right Atrium: Right atrial size was normal in size. Pericardium: There is no evidence of pericardial effusion. Mitral Valve: The mitral valve is normal in structure. Normal mobility of the mitral valve leaflets. Trivial mitral valve regurgitation. No evidence of mitral valve stenosis. There is no evidence of mitral valve vegetation. Tricuspid Valve: The tricuspid valve is normal in structure. Tricuspid valve regurgitation is mild . No evidence of tricuspid stenosis. There is no evidence of tricuspid valve vegetation. Aortic Valve: The aortic valve is normal in structure. Aortic valve regurgitation is not visualized. No aortic stenosis is present. There is no evidence of aortic valve vegetation. Pulmonic Valve: The pulmonic valve was normal in structure. Pulmonic valve regurgitation is trivial. No evidence of pulmonic stenosis. Aorta: The aortic root is normal in size and structure. Venous: The  inferior vena cava is normal in size with greater than 50% respiratory variability, suggesting right atrial pressure of 3 mmHg. IAS/Shunts: No atrial level shunt detected by color flow Doppler. [name] MD Electronically signed by [name] MD Signature Date/Time: 10/24/2019/3:28:45 PM    Final    VAS US CAROTID (at [facility] and [facility] only)  Result Date: 10/26/2019 Carotid Arterial Duplex Study Indications:       17mm focus of diffusion restriction in the left                     frontoparietal lobe concerning for acute/early subacute                    infarct vs cerebritis. Risk Factors:      Hypertension, Diabetes. Other Factors:     Covid-19, bacteremia. Comparison Study:  No prior study on file Performing Technologist: Sherren Kerns RVS  Examination Guidelines: A complete evaluation includes B-mode imaging, spectral Doppler, color Doppler, and power Doppler as needed of all accessible portions of each vessel. Bilateral testing is considered an integral part of a complete examination. Limited examinations for reoccurring indications may be performed as noted.  Right Carotid Findings: +----------+--------+--------+--------+------------------+--------+           PSV cm/sEDV cm/sStenosisPlaque DescriptionComments +----------+--------+--------+--------+------------------+--------+ CCA Prox  66      11                                         +----------+--------+--------+--------+------------------+--------+ CCA Distal71      18                                         +----------+--------+--------+--------+------------------+--------+ ICA Prox  58      19                                         +----------+--------+--------+--------+------------------+--------+ ICA Distal90      33                                         +----------+--------+--------+--------+------------------+--------+ ECA       114     16                                         +----------+--------+--------+--------+------------------+--------+ +----------+--------+-------+--------+-------------------+           PSV cm/sEDV cmsDescribeArm Pressure (mmHG) +----------+--------+-------+--------+-------------------+ [id number]                                        +----------+--------+-------+--------+-------------------+ +---------+--------+--+--------+--+ VertebralPSV cm/s38EDV cm/s11 +---------+--------+--+--------+--+  Left Carotid Findings:  +----------+--------+--------+--------+------------------+--------+           PSV cm/sEDV cm/sStenosisPlaque DescriptionComments +----------+--------+--------+--------+------------------+--------+ CCA Prox  55      14                                         +----------+--------+--------+--------+------------------+--------+  CCA Distal65      20                                         +----------+--------+--------+--------+------------------+--------+ ICA Prox  68      20                                         +----------+--------+--------+--------+------------------+--------+ ICA Distal76      23                                         +----------+--------+--------+--------+------------------+--------+ ECA       77      3                                          +----------+--------+--------+--------+------------------+--------+ +----------+--------+--------+--------+-------------------+           PSV cm/sEDV cm/sDescribeArm Pressure (mmHG) +----------+--------+--------+--------+-------------------+ [id number]                                          +----------+--------+--------+--------+-------------------+ +---------+--------+--+--------+--+ VertebralPSV cm/s63EDV cm/s18 +---------+--------+--+--------+--+   Summary: Right Carotid: The extracranial vessels were near-normal with only minimal wall                thickening or plaque. Left Carotid: The extracranial vessels were near-normal with only minimal wall               thickening or plaque. Vertebrals:  Bilateral vertebral arteries demonstrate antegrade flow. Subclavians: Normal flow hemodynamics were seen in bilateral subclavian              arteries. *See table(s) above for measurements and observations.  Electronically signed by Delia Heady MD on 10/26/2019 at 12:37:48 PM.    Final    VAS [place] LOWER EXTREMITY ARTERIAL DUPLEX  Result Date: 10/26/2019 LOWER EXTREMITY ARTERIAL DUPLEX STUDY  Indications: Ulceration, and Osteomyelitis. High Risk Factors: Hypertension, Diabetes. Other Factors: Covid-19. Charcot foot.  Current ABI: N/A Comparison Study: No prior study Performing Technologist: Sherren Kerns RVS  Examination Guidelines: A complete evaluation includes B-mode imaging, spectral Doppler, color Doppler, and power Doppler as needed of all accessible portions of each vessel. Bilateral testing is considered an integral part of a complete examination. Limited examinations for reoccurring indications may be performed as noted.  +----------+--------+-----+--------+---------+--------+ LEFT      PSV cm/sRatioStenosisWaveform Comments +----------+--------+-----+--------+---------+--------+ CFA Prox  123                  triphasic         +----------+--------+-----+--------+---------+--------+ DFA       51                   triphasic         +----------+--------+-----+--------+---------+--------+ SFA Prox  97                   triphasic         +----------+--------+-----+--------+---------+--------+ SFA Mid   88  triphasic         +----------+--------+-----+--------+---------+--------+ SFA Distal81                   triphasic         +----------+--------+-----+--------+---------+--------+ POP Prox  60                   triphasic         +----------+--------+-----+--------+---------+--------+ POP Distal86                   triphasic         +----------+--------+-----+--------+---------+--------+ ATA Distal110                  triphasic         +----------+--------+-----+--------+---------+--------+ PTA Prox  34                   triphasic         +----------+--------+-----+--------+---------+--------+ PTA Mid   29                   triphasic         +----------+--------+-----+--------+---------+--------+ PTA [id number]                  triphasic         +----------+--------+-----+--------+---------+--------+   Summary: Left: Normal waveforms and adequate flow noted.  See table(s) above for measurements and observations. Electronically signed by Lemar Livings MD on 10/26/2019 at 4:50:14 PM.    Final    VAS [place] LOWER EXTREMITY ARTERIAL DUPLEX  Result Date: 10/19/2019 LOWER EXTREMITY ARTERIAL DUPLEX STUDY Indications: Cold leg, Covid-19.  Current ABI: N/A Comparison Study: No prior study Performing Technologist: Sherren Kerns RVS  Examination Guidelines: A complete evaluation includes B-mode imaging, spectral Doppler, color Doppler, and power Doppler as needed of all accessible portions of each vessel. Bilateral testing is considered an integral part of a complete examination. Limited examinations for reoccurring indications may be performed as noted.  +-----------+--------+-----+--------+---------+--------+ RIGHT      PSV cm/sRatioStenosisWaveform Comments +-----------+--------+-----+--------+---------+--------+ CFA Prox   67                   triphasic         +-----------+--------+-----+--------+---------+--------+ DFA        60                   triphasic         +-----------+--------+-----+--------+---------+--------+ SFA Prox   68                   triphasic         +-----------+--------+-----+--------+---------+--------+ SFA Mid    62                   triphasic         +-----------+--------+-----+--------+---------+--------+ SFA Distal 69                   triphasic         +-----------+--------+-----+--------+---------+--------+ POP Prox   47                   triphasic         +-----------+--------+-----+--------+---------+--------+ POP Distal 42                   triphasic         +-----------+--------+-----+--------+---------+--------+ ATA Distal 15  triphasic         +-----------+--------+-----+--------+---------+--------+ PTA Prox   31                   triphasic          +-----------+--------+-----+--------+---------+--------+ PTA Mid    38                   triphasic         +-----------+--------+-----+--------+---------+--------+ PTA Distal 38                   triphasic         +-----------+--------+-----+--------+---------+--------+ PERO Distal36                   triphasic         +-----------+--------+-----+--------+---------+--------+  Summary: Right: Normal examination. No evidence of arterial occlusive disease. Normal waveforms, no stenosis.  See table(s) above for measurements and observations. Electronically signed by Sherald Hess MD on 10/19/2019 at 3:25:02 PM.    Final    VAS [place] LOWER EXTREMITY VENOUS (DVT)  Result Date: 10/26/2019  Lower Venous DVTStudy Indications: Covid-19, elevated D-Dimer.  Comparison Study: Prior negative left lower extremity venous duplex don 03/19/17                   and is available for comparison. Performing Technologist: Sherren Kerns RVS  Examination Guidelines: A complete evaluation includes B-mode imaging, spectral Doppler, color Doppler, and power Doppler as needed of all accessible portions of each vessel. Bilateral testing is considered an integral part of a complete examination. Limited examinations for reoccurring indications may be performed as noted. The reflux portion of the exam is performed with the patient in reverse Trendelenburg.  +---------+---------------+---------+-----------+----------+--------------+ RIGHT    CompressibilityPhasicitySpontaneityPropertiesThrombus Aging +---------+---------------+---------+-----------+----------+--------------+ CFV      Full           Yes      Yes                                 +---------+---------------+---------+-----------+----------+--------------+ SFJ      Full                                                        +---------+---------------+---------+-----------+----------+--------------+ FV Prox  Full                                                         +---------+---------------+---------+-----------+----------+--------------+ FV Mid   Full                                                        +---------+---------------+---------+-----------+----------+--------------+ FV DistalFull                                                        +---------+---------------+---------+-----------+----------+--------------+  PFV      Full                                                        +---------+---------------+---------+-----------+----------+--------------+ POP      Partial        Yes      Yes                  Acute          +---------+---------------+---------+-----------+----------+--------------+ PTV      Full                                                        +---------+---------------+---------+-----------+----------+--------------+ PERO     Full                                                        +---------+---------------+---------+-----------+----------+--------------+   +---------+---------------+---------+-----------+----------+--------------+ LEFT     CompressibilityPhasicitySpontaneityPropertiesThrombus Aging +---------+---------------+---------+-----------+----------+--------------+ CFV      Full           Yes      Yes                                 +---------+---------------+---------+-----------+----------+--------------+ SFJ      Full                                                        +---------+---------------+---------+-----------+----------+--------------+ FV Prox  Full                                                        +---------+---------------+---------+-----------+----------+--------------+ FV Mid   Full                                                        +---------+---------------+---------+-----------+----------+--------------+ FV DistalFull                                                         +---------+---------------+---------+-----------+----------+--------------+ PFV      Full                                                        +---------+---------------+---------+-----------+----------+--------------+  POP      Full           Yes      Yes                                 +---------+---------------+---------+-----------+----------+--------------+ PTV      Full                                                        +---------+---------------+---------+-----------+----------+--------------+ PERO     Full                                                        +---------+---------------+---------+-----------+----------+--------------+     Summary: RIGHT: - Findings consistent with acute deep vein thrombosis involving the right popliteal vein. - Ultrasound characteristics of enlarged lymph nodes are noted in the groin.  LEFT: - There is no evidence of deep vein thrombosis in the lower extremity.  *See table(s) above for measurements and observations. Electronically signed by Servando Snare MD on 10/26/2019 at 4:50:27 PM.    Final    [place] EKG SITE RITE  Result Date: 10/12/2019 If Site Rite image not attached, placement could not be confirmed due to current cardiac rhythm.

## 2019-11-03 NOTE — Progress Notes (Signed)
Inpatient Rehab Admissions Coordinator:  There are no beds available today or this weekend for CIR admission.  Will follow up with pt next week.  Jaelin Devincentis Graves Madden, MS, CCC-SLP Admissions Coordinator [phone]  

## 2019-11-03 NOTE — Plan of Care (Signed)
  Problem: Education: Goal: Knowledge of risk factors and measures for prevention of condition will improve Outcome: Progressing   Problem: Coping: Goal: Psychosocial and spiritual needs will be supported Outcome: Progressing   Problem: Respiratory: Goal: Will maintain a patent airway Outcome: Progressing   Problem: Education: Goal: Knowledge of General Education information will improve Description: Including pain rating scale, medication(s)/side effects and non-pharmacologic comfort measures Outcome: Progressing   Problem: Clinical Measurements: Goal: Respiratory complications will improve Outcome: Progressing   Problem: Activity: Goal: Risk for activity intolerance will decrease Outcome: Progressing   Problem: Coping: Goal: Level of anxiety will decrease Outcome: Progressing   Problem: Pain Managment: Goal: General experience of comfort will improve Outcome: Progressing   Problem: Safety: Goal: Ability to remain free from injury will improve Outcome: Progressing   Problem: Skin Integrity: Goal: Risk for impaired skin integrity will decrease Outcome: Progressing   Problem: Education: Goal: Knowledge of disease or condition will improve Outcome: Progressing Goal: Knowledge of secondary prevention will improve Outcome: Progressing Goal: Knowledge of patient specific risk factors addressed and post discharge goals established will improve Outcome: Progressing Goal: Individualized Educational Video(s) Outcome: Progressing   Problem: Coping: Goal: Will verbalize positive feelings about self Outcome: Progressing Goal: Will identify appropriate support needs Outcome: Progressing   Problem: Health Behavior/Discharge Planning: Goal: Ability to manage health-related needs will improve Outcome: Progressing   Problem: Self-Care: Goal: Ability to participate in self-care as condition permits will improve Outcome: Progressing Goal: Verbalization of feelings and  concerns over difficulty with self-care will improve Outcome: Progressing Goal: Ability to communicate needs accurately will improve Outcome: Progressing   Problem: Nutrition: Goal: Risk of aspiration will decrease Outcome: Progressing Goal: Dietary intake will improve Outcome: Progressing   Problem: Ischemic Stroke/TIA Tissue Perfusion: Goal: Complications of ischemic stroke/TIA will be minimized Outcome: Progressing   

## 2019-11-04 LAB — GLUCOSE, CAPILLARY
Glucose-Capillary: 160 mg/dL — ABNORMAL HIGH (ref 70–99)
Glucose-Capillary: 148 mg/dL — ABNORMAL HIGH (ref 70–99)
Glucose-Capillary: 194 mg/dL — ABNORMAL HIGH (ref 70–99)
Glucose-Capillary: 192 mg/dL — ABNORMAL HIGH (ref 70–99)
Glucose-Capillary: 188 mg/dL — ABNORMAL HIGH (ref 70–99)

## 2019-11-04 LAB — MAGNESIUM: Magnesium: 1.8 mg/dL (ref 1.7–2.4)

## 2019-11-04 MED ORDER — MAGNESIUM SULFATE 2 GM/50ML IV SOLN
2.0000 g | Freq: Once | INTRAVENOUS | Status: AC
  Administered 2019-11-04: 2 g via INTRAVENOUS
  Filled 2019-11-04: qty 50

## 2019-11-04 NOTE — Progress Notes (Signed)
PROGRESS NOTE                                                                                                                                                                                                             Patient Demographics:    Micheal Bell, is a 49 y.o. male, DOB - 03/07/1970, ZOX:[id number]  Outpatient Primary MD for the patient is [name], MD    LOS - 23  Admit date - 10/09/2019    Chief Complaint  Patient presents with  . Cough       Brief Narrative - 49 yo male with hx of HTN, T2DM, gout, IBS, fibromyalgia, obesity who presented on 8/16 for COVID pneumonia.  He decompensated on 8/19 and was intubated.  Self extubated on 8/23 and was reintubated.  He's subsequently been extubated on 8/28.  He's been on typical therapies for covid including steroids, baricitinib and remdesivir.  His hospitalization was complicated by MSSA and GBS bacteria.  Also found to have systolic heart failure.    Subjective:   Patient in bed, appears comfortable, denies any headache, no fever, no chest pain or pressure, no shortness of breath , no abdominal pain. No focal weakness.   Assessment  & Plan :   Acute Hypoxic Resp. Failure due to Acute Covid 19 Viral Pneumonitis during the ongoing 2020 Covid 19 Pandemic - he had severe hypoxia and parenchymal lung injury he was intubated and admitted by ICU, he was started on IV steroids, remdesivir and Baricitinib on 10/12/2019.  He was subsequently extubated on 10/21/2019.  His stay was complicated by bacteremia after which Baricitinib was discontinued on 10/24/2019.  His pulmonary status is gradually improving, currently he is stable and symptom-free, he is on room air, I have discussed with him about incentive spirometry, flutter valve, pulmonary toiletry,   Recent Labs  Lab 10/29/19 0409 10/29/19 0410 10/30/19 0320 10/31/19 0411 11/01/19 0152 11/02/19 0342 11/03/19 0356    WBC 10.5  --   --  8.3 8.7 8.0 8.6  PLT 363  --   --  338 342 322 328  CRP 8.4*  --   --   --   --   --   --   BNP  --  76.9  --   --   --   --   --  DDIMER 5.18*  --   --   --   --   --   --   PROCALCITON <0.10  --  <0.10  --   --   --   --   AST 21  --   --  18 19 17 17   ALT 17  --   --  17 18 17 17   ALKPHOS 86  --   --  85 89 92 85  BILITOT 0.8  --   --  0.7 0.6 0.7 0.8  ALBUMIN 2.1*  --   --  2.1* 2.0* 2.1* 2.1*    Sepsis with MSSA and Group B strep bacteremia due to left foot abscess.  ID following, was on IV Ancef but switch to nafcillin on 10/26/2019.  Clean TEE, history of Charcot joints, peripheral neuropathy and recent left foot soft tissue injury, MRI left foot shows an abscess, Ortho was consulted and they recommended medical management with outpatient follow-up with Dr. post discharge, also has osteomyelitis, defer duration of treatment with antibiotics to ID.  Antibiotics management per ID, plan to continue total of 4 weeks of IV nafcillin, through 9/29, then start Keflex 500 mg 4 times daily, he has a follow-up arranged with Dr. [name] on October 13 at 10:30 AM .  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  For seizures he has been started on Keppra with good effect and his mentation has improved considerably. Continue PT OT and speech, SNF VS CIR .  Neuro following.   Possible systolic heart failure as shown by TTE however this could have been transient due to sepsis, on repeat TEE his EF is now normalized to 50 to 55%.  He is currently compensated, blood pressure was too low and after hydration has improved, continue beta-blocker.    Cyanotic right lower extremity toes.  Likely due to hypoperfusion which is transient, arterial ultrasound stable. Resolved.   History of chronic pain.  On multiple narcotics, Flexeril and benzodiazepines.  All on hold in the hospital.  HTN - BP was low on multiple blood pressure medications, was hydrated now able to tolerate low-dose beta-blocker.  Elevated D-dimer, positive acute DVT in the right lower extremity.  Switched to full dose Lovenox.   Hypomagnesemia.  Replaced.    DM type II.  Currently on Lantus and sliding scale monitor.  Lab Results  Component Value Date   HGBA1C 11.3 (H) 10/26/2019   CBG (last 3)  Recent Labs    11/03/19 2345 11/04/19 0405 11/04/19 0728  GLUCAP 224* 160* 148*      Condition -   Guarded  Family Communication  :  Sister and mother [phone] - 10/25/19, 10/27/19, 10/28/19, 11/03/19  Code Status :  Full  Consults  :  PCCM, ID, Cards  Procedures  :    EEG - This study showed evidence of potential epileptogenicity arising from bilateral frontocentral, maximal vertex region. The sharp waves are at times rhythmic consistent with brief ictal-interictal rhythmic discharges. Additionally, there is evidence of mild diffuse encephalopathy, nonspecific etiology.  Bilateral lower extremity venous ultrasound - Acute right lower extremity popliteal vein DVT.  Carotid  duplex.  Nonacute.    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  CT Head -  1. No acute intracranial abnormality. 2. Area of subcortical low-density involving the posterior left frontal lobe at the convexity, likely encephalomalacia and sequela of remote infarct. There are no prior exams for comparison to establish chronicity. MRI could be considered for further evaluation based on clinical concern. 3. Paranasal sinus fluid levels on opacification of the right greater than left mastoid air cells,  possibly related to intubation.  R. Leg Arterial US -  No evidence of arterial occlusive disease. Normal waveforms, no stenosis  TTE -  1. Extremely poor acoustic windows. LVEF appears to be depressed with diffuse hypokinesis, worse in the mid/distal inferior/inferoseptal walls. . Left ventricular ejection fraction, by estimation, is 30 to 35%. The left ventricle has moderately decreased function. The left ventricular internal cavity size was mildly dilated. Left ventricular diastolic parameters are indeterminate.  2. Right ventricular systolic function is moderately reduced. The right ventricular size is mildly enlarged.  3. The mitral valve is normal in structure. Trivial mitral valve regurgitation.  4. The aortic valve is normal in structure. Aortic valve regurgitation is not visualized  TEE - 1. Left ventricular ejection fraction, by estimation, is 50 to 55%. The left ventricle has low normal function. The left ventricle has no regional wall motion abnormalities.  2. Right ventricular systolic function is normal. The right ventricular size is normal.  3. No left atrial/left atrial appendage thrombus was detected.  4. The mitral valve is normal in structure. Trivial mitral valve regurgitation. No evidence of mitral stenosis.  5. The aortic valve is normal in structure. Aortic valve regurgitation is not visualized. No aortic stenosis is present.  6. The inferior vena cava is normal in size with greater than 50% respiratory variability, suggesting right atrial pressure of 3 mmHg.  CT - 1. No acute intracranial abnormality. 2. Area of subcortical low-density involving the posterior left frontal lobe at the convexity, likely encephalomalacia and sequela of remote infarct. There are no prior exams for comparison to establish chronicity. MRI could be considered for further evaluation based on clinical concern. 3. Paranasal sinus fluid levels on opacification of the right greater than left mastoid air cells,  possibly related to intubation    PUD Prophylaxis : PPI  Disposition Plan  :    Status is: Inpatient  Remains inpatient appropriate because:IV treatments appropriate due to intensity of illness or inability to take PO   Dispo: The patient is from: Home              Anticipated d/c is to: Home              Anticipated d/c date is: 1 day              Patient currently is medically stable to d/c. to CIR when bed is available.   DVT Prophylaxis  :  Lovenox    Lab Results  Component Value Date   PLT 328 11/03/2019    Diet :  Diet Order            Diet heart healthy/carb modified Room service appropriate? No; Fluid consistency: Thin  Diet effective now                  Inpatient Medications  Scheduled Meds: . alteplase  2 mg Intracatheter Once  . atorvastatin  40 mg  Oral Daily  . buPROPion  150 mg Oral Daily  . Chlorhexidine Gluconate Cloth  6 each Topical Daily  . docusate sodium  100 mg Oral BID  . enoxaparin (LOVENOX) injection  110 mg Subcutaneous Q12H  . feeding supplement (ENSURE ENLIVE)  237 mL Oral TID BM  . insulin aspart  0-15 Units Subcutaneous Q4H  . insulin glargine  20 Units Subcutaneous Daily  . levETIRAcetam  500 mg Oral BID  . linagliptin  5 mg Oral Daily  . melatonin  3 mg Oral QHS  . metoprolol tartrate  50 mg Oral BID  . pantoprazole  40 mg Oral Daily  . polyethylene glycol  17 g Oral Daily  . senna  2 tablet Oral BID  . sodium chloride flush  10-40 mL Intracatheter Q12H   Continuous Infusions: . sodium chloride 10 mL/hr at 11/02/19 0600  . magnesium sulfate bolus IVPB 2 g (11/04/19 1018)  . nafcillin (NAFCIL) continuous infusion 12 g (11/03/19 1000)   PRN Meds:.sodium chloride, acetaminophen, bisacodyl, cyclobenzaprine, HYDROcodone-acetaminophen, Ipratropium-Albuterol  Antibiotics  :    Anti-infectives (From admission, onward)   Start     Dose/Rate Route Frequency Ordered Stop   10/31/19 1415  nafcillin 12 g in sodium chloride 0.9 %  500 mL continuous infusion  Status:  Discontinued        12 g 20.8 mL/hr over 24 Hours Intravenous Every 24 hours 10/31/19 0608 10/31/19 0609   10/29/19 1415  nafcillin 12 g in dextrose 5 % 500 mL IVPB  Status:  Discontinued        12 g 20.8 mL/hr over 24 Hours Intravenous Every 24 hours 10/29/19 0434 10/29/19 0507   10/29/19 1415  nafcillin 12 g in sodium chloride 0.9 % 500 mL continuous infusion        12 g 20.8 mL/hr over 24 Hours Intravenous Every 24 hours 10/29/19 0507     10/26/19 1415  nafcillin 12 g in dextrose 5 % 500 mL IVPB  Status:  Discontinued        12 g 20.8 mL/hr over 24 Hours Intravenous Every 24 hours 10/26/19 1414 10/29/19 0434   10/26/19 1400  nafcillin injection 12 g  Status:  Discontinued        12 g Intravenous Daily 10/26/19 1238 10/26/19 1242   10/26/19 1400  nafcillin 12 g in dextrose 5 % 50 mL IVPB  Status:  Discontinued        12 g 100 mL/hr over 30 Minutes Intravenous Every 24 hours 10/26/19 1242 10/26/19 1339   10/26/19 1400  nafcillin 12 g in dextrose 5 % 500 mL IVPB  Status:  Discontinued        12 g 20.8 mL/hr over 24 Hours Intravenous Every 24 hours 10/26/19 1337 10/26/19 1416   10/26/19 1200  nafcillin 2 g in sodium chloride 0.9 % 100 mL IVPB  Status:  Discontinued        2 g 200 mL/hr over 30 Minutes Intravenous Every 4 hours 10/26/19 1010 10/26/19 1238   10/12/19 2200  cefTRIAXone (ROCEPHIN) 2 g in sodium chloride 0.9 % 100 mL IVPB  Status:  Discontinued        2 g 200 mL/hr over 30 Minutes Intravenous Every 24 hours 10/12/19 0441 10/12/19 0921   10/12/19 0930  ceFAZolin (ANCEF) IVPB 2g/100 mL premix  Status:  Discontinued        2 g 200 mL/hr over 30 Minutes Intravenous Every 8 hours 10/12/19 0921 10/26/19 1010  10/11/19 1000  remdesivir 100 mg in sodium chloride 0.9 % 100 mL IVPB       "Followed by" Linked Group Details   100 mg 200 mL/hr over 30 Minutes Intravenous Daily 10/10/19 0050 10/14/19 0956   10/11/19 0800  vancomycin (VANCOCIN) IVPB  1000 mg/200 mL premix  Status:  Discontinued        1,000 mg 200 mL/hr over 60 Minutes Intravenous Every 8 hours 10/10/19 2239 10/12/19 0921   10/10/19 2230  vancomycin (VANCOCIN) IVPB 1000 mg/200 mL premix        1,000 mg 200 mL/hr over 60 Minutes Intravenous Every 1 hr x 2 10/10/19 2208 10/11/19 0052   10/10/19 2215  cefTRIAXone (ROCEPHIN) 2 g in sodium chloride 0.9 % 100 mL IVPB  Status:  Discontinued        2 g 200 mL/hr over 30 Minutes Intravenous Every 24 hours 10/10/19 2205 10/12/19 0441   10/10/19 0100  remdesivir 100 mg in sodium chloride 0.9 % 100 mL IVPB       "Followed by" Linked Group Details   100 mg 200 mL/hr over 30 Minutes Intravenous Every 30 min 10/10/19 0050 10/10/19 0332        Susa Raring M.D on 11/04/2019 at 10:39 AM  To page go to [email or web address] -  [facility] -  Office  [phone]   See all Orders from today for further details    Objective:   Vitals:   11/03/19 2346 11/04/19 0406 11/04/19 0500 11/04/19 0744  BP: (!) 123/95 113/65  122/69  Pulse: 100 80  95  Resp: 20 (!) 22    Temp: 98.3 F (36.8 C) 98.6 F (37 C)  98.8 F (37.1 C)  TempSrc: Axillary Oral  Oral  SpO2: 92% 94%  92%  Weight:   116.4 kg   Height:        Wt Readings from Last 3 Encounters:  11/04/19 116.4 kg  09/11/19 111.1 kg  05/12/17 (!) 145.2 kg     Intake/Output Summary (Last 24 hours) at 11/04/2019 1039 Last data filed at 11/04/2019 0746 Gross per 24 hour  Intake 145.6 ml  Output 3100 ml  Net -2954.4 ml     Physical Exam  Awake Alert, No new F.N deficits, chronic bilateral rotator cuff injury induced bilateral upper extremity weakness in the deltoid area, per patient this is old and unchanged.  [place].AT,PERRAL Supple Neck,No JVD, No cervical lymphadenopathy appriciated.  Symmetrical Chest wall movement, Good air movement bilaterally, CTAB RRR,No Gallops, Rubs or new Murmurs, No Parasternal Heave +ve B.Sounds, Abd Soft, No tenderness, No organomegaly  appriciated, No rebound - guarding or rigidity. No Cyanosis, Clubbing or edema, No new Rash or bruise    Data Review:    CBC Recent Labs  Lab 10/29/19 0409 10/31/19 0411 11/01/19 0152 11/02/19 0342 11/03/19 0356  WBC 10.5 8.3 8.7 8.0 8.6  HGB 9.7* 10.1* 9.7* 9.7* 9.6*  HCT 30.8* 30.5* 30.8* 30.5* 30.2*  PLT 363 338 342 322 328  MCV 93.1 91.9 93.6 93.8 93.8  MCH 29.3 30.4 29.5 29.8 29.8  MCHC 31.5 33.1 31.5 31.8 31.8  RDW 12.4 12.5 12.7 12.9 13.1  LYMPHSABS 2.8 2.3 2.5 2.6 2.6  MONOABS 0.6 0.6 0.5 0.6 0.7  EOSABS 0.2 0.3 0.3 0.4 0.4  BASOSABS 0.0 0.0 0.0 0.0 0.1    Recent Labs  Lab 10/29/19 0409 10/29/19 0410 10/30/19 0320 10/31/19 0411 11/01/19 0152 11/02/19 0342 11/03/19 0356  NA 134*  --   --  134* 133* 135 136  K 3.7  --   --  3.7 3.6 3.8 3.6  CL 100  --   --  98 97* 98 102  CO2 26  --   --  27 24 25 24   GLUCOSE 145*  --   --  139* 228* 167* 105*  BUN 7  --   --  6 6 5* <5*  CREATININE 0.67  --   --  0.59* 0.62 0.74 0.70  CALCIUM 8.5*  --   --  8.9 8.5* 8.7* 8.8*  AST 21  --   --  18 19 17 17   ALT 17  --   --  17 18 17 17   ALKPHOS 86  --   --  85 89 92 85  BILITOT 0.8  --   --  0.7 0.6 0.7 0.8  ALBUMIN 2.1*  --   --  2.1* 2.0* 2.1* 2.1*  MG 1.4*  --   --  1.3* 1.5* 1.8 1.4*  CRP 8.4*  --   --   --   --   --   --   DDIMER 5.18*  --   --   --   --   --   --   PROCALCITON <0.10  --  <0.10  --   --   --   --   BNP  --  76.9  --   --   --   --   --     ------------------------------------------------------------------------------------------------------------------ No results for input(s): CHOL, HDL, LDLCALC, TRIG, CHOLHDL, LDLDIRECT in the last 72 hours.  Lab Results  Component Value Date   HGBA1C 11.3 (H) 10/26/2019   ------------------------------------------------------------------------------------------------------------------ No results for input(s): TSH, T4TOTAL, T3FREE, THYROIDAB in the last 72 hours.  Invalid input(s): FREET3  Cardiac  Enzymes No results for input(s): CKMB, TROPONINI, MYOGLOBIN in the last 168 hours.  Invalid input(s): CK ------------------------------------------------------------------------------------------------------------------    Component Value Date/Time   BNP 76.9 10/29/2019 0410    Micro Results No results found for this or any previous visit (from the past 240 hour(s)).  Radiology Reports CT HEAD WO CONTRAST  Result Date: 10/19/2019 CLINICAL DATA:  Deliriums.  Mental status change. EXAM: CT HEAD WITHOUT CONTRAST TECHNIQUE: Contiguous axial images were obtained from the base of the skull through the vertex without intravenous contrast. COMPARISON:  None. FINDINGS: Brain: Area subcortical low-density involving the posterior left frontal lobe at the convexity, series 3, image 25 and series 5, image 49, likely encephalomalacia and sequela of remote infarct, however nonspecific. No hemorrhage. No evidence of acute ischemia. No hydrocephalus, midline shift or mass effect. No subdural or extra-axial collection. Vascular: Atherosclerosis of skullbase vasculature without hyperdense vessel or abnormal calcification. Skull: No fracture or focal lesion. Sinuses/Orbits: Nasogastric tube in place. Fluid levels in the sphenoid sinuses and right maxillary sinus with scattered opacification of ethmoid air cells may be related to intubation. Opacification of the right greater than left mastoid air cells. Other: None. IMPRESSION: 1. No acute intracranial abnormality. 2. Area of subcortical low-density involving the posterior left frontal lobe at the convexity, likely encephalomalacia and sequela of remote infarct. There are no prior exams for comparison to establish chronicity. MRI could be considered for further evaluation based on clinical concern. 3. Paranasal sinus fluid levels on opacification of the right greater than left mastoid air cells, possibly related to intubation. Electronically Signed   By: 12/26/2019 M.D.   On: 10/19/2019 15:30   CT ANGIO CHEST PE  W OR WO CONTRAST  Result Date: 10/23/2019 CLINICAL DATA:  COVID pneumonia, acute hypoxic respiratory failure, positive D-dimer EXAM: CT ANGIOGRAPHY CHEST WITH CONTRAST TECHNIQUE: Multidetector CT imaging of the chest was performed using the standard protocol during bolus administration of intravenous contrast. Multiplanar CT image reconstructions and MIPs were obtained to evaluate the vascular anatomy. CONTRAST:  77mL OMNIPAQUE IOHEXOL 350 MG/ML SOLN COMPARISON:  None. FINDINGS: Cardiovascular: Satisfactory opacification of the pulmonary arteries to the segmental level. No evidence of pulmonary embolism. The central pulmonary arteries are of normal caliber. There is moderate coronary artery calcification. Normal heart size. No pericardial effusion. The thoracic aorta is unremarkable save for bovine arch anatomy. Right upper extremity PICC line tip is seen within the superior vena cava. Mediastinum/Nodes: No enlarged mediastinal, hilar, or axillary lymph nodes. Thyroid gland, trachea, and esophagus demonstrate no significant findings. Nasoenteric feeding tube extends into the upper abdomen beyond the margin of the examination. Lungs/Pleura: Lung volumes are small. Evaluation is limited by motion artifact, however, multifocal pulmonary infiltrates are identified demonstrating a peripheral and basal predominance, likely infectious or inflammatory in the acute setting. No central obstructing lesion. No pneumothorax or pleural effusion. No superimposed interstitial edema. Upper Abdomen: No acute abnormality. Musculoskeletal: No chest wall abnormality. No acute or significant osseous findings. Review of the MIP images confirms the above findings. IMPRESSION: 1. No evidence of pulmonary embolism. 2. Multifocal pulmonary infiltrates are identified demonstrating a peripheral and basal predominance, likely infectious or inflammatory in the acute setting. 3. Moderate  coronary artery calcification. Electronically Signed   By: Helyn Numbers MD   On: 10/23/2019 16:25   MR ANGIO HEAD WO CONTRAST  Result Date: 10/29/2019 CLINICAL DATA:  Neuro deficit with stroke suspected EXAM: MRA HEAD WITHOUT CONTRAST TECHNIQUE: Angiographic images of the Circle of Willis were obtained using MRA technique without intravenous contrast. COMPARISON:  Four days ago FINDINGS: History of bacteremia and embolic infarction. No vessel beading, stenosis, or pseudoaneurysm. IMPRESSION: Negative intracranial MRA. Electronically Signed   By: Marnee Spring M.D.   On: 10/29/2019 12:04   MR BRAIN WO CONTRAST  Result Date: 10/25/2019 CLINICAL DATA:  Neuro deficit, acute, stroke suspected. Additional history provided: COVID positive. Additional history obtained from electronic MEDICAL RECORD NUMBERSepsis with MSSA and group B strep bacteremia. EXAM: MRI HEAD WITHOUT CONTRAST TECHNIQUE: Multiplanar, multiecho pulse sequences of the brain and surrounding structures were obtained without intravenous contrast. COMPARISON:  Head CT 10/19/2019. FINDINGS: Brain: The examination is limited by poor signal on several sequences as well as motion degradation. Most notably, there is moderate motion degradation of the axial T2 weighted sequence, severe motion degradation of the axial SWI sequence and moderate motion degradation of the coronal T2 weighted sequence. Cerebral volume is normal for age. There is a 17 mm focus of restricted diffusion and corresponding T2/FLAIR hyperintensity within the subcortical left frontoparietal lobes (for instance as seen on series 2, image 39) (series 3, image 17). Additional mild scattered T2/FLAIR hyperintensity within the cerebral white matter and pons is nonspecific, but consistent with chronic small vessel ischemic disease. Severe motion degradation of the axial SWI sequence precludes adequate evaluation for intracranial blood products. No extra-axial fluid collection. No midline shift.  Vascular: Expected proximal arterial flow voids. Skull and upper cervical spine: No focal marrow lesion is identified within described limitations. Sinuses/Orbits: Visualized orbits show no acute finding. Air-fluid levels within the sphenoid and right maxillary sinuses. Partial opacification of right ethmoid air cells. Bilateral mastoid effusions. These results will be called to the  ordering clinician or representative by the Radiologist Assistant, and communication documented in the PACS or [employer]. IMPRESSION: Examination limited by poor signal on several sequences as well as motion degradation, as described. 17 mm focus of restricted diffusion and T2/FLAIR hyperintensity within the subcortical left frontoparietal lobes. This finding is nonspecific, but given the patient's history, the primary differential considerations are acute/early subacute infarct versus cerebritis. Consider contrast-enhanced MR imaging of the brain for further evaluation of this lesion. Mild chronic small vessel ischemic disease. Paranasal sinus disease with air-fluid levels. Bilateral mastoid effusions. Electronically Signed   By: Jackey Loge DO   On: 10/25/2019 15:16   MR BRAIN W CONTRAST  Result Date: 10/25/2019 CLINICAL DATA:  Brain mass follow-up EXAM: MRI HEAD WITH CONTRAST TECHNIQUE: Multiplanar, multiecho pulse sequences of the brain and surrounding structures were obtained with intravenous contrast. CONTRAST:  10mL GADAVIST GADOBUTROL 1 MMOL/ML IV SOLN COMPARISON:  Brain MRI without contrast 10/25/2019 FINDINGS: Brain: There is a peripherally enhancing lesion at the base of the left precentral gyrus that measures 0.9 x 0.7 x 1.8 cm. There are no other areas of abnormal contrast enhancement. 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 Electronically Signed   By: Deatra Robinson M.D.   On: 10/25/2019 22:47   MR FOOT LEFT WO  CONTRAST  Result Date: 10/25/2019 CLINICAL DATA:  Bacteremia left foot pain and diabetic EXAM: MRI OF THE LEFT FOOT WITHOUT CONTRAST TECHNIQUE: Multiplanar, multisequence MR imaging of the left was performed. No intravenous contrast was administered. COMPARISON:  October 23, 2019 FINDINGS: Bones/Joint/Cartilage Mildly angulated nondisplaced fractures of the midshaft of the fourth and fifth metatarsals are noted. There is periosteal reaction seen along the medial margins of the metatarsal shafts. Increased heterogeneous T2 hyperintense signal with subtle T1 hypointensity seen at the base of the fourth and fifth metatarsals. No other osseous marrow signal abnormality is seen. There is no large knee joint effusions noted. The articular surfaces appear to be maintained. Ligaments The Lisfranc ligament is intact. Muscles and Tendons Increased feathery signal with atrophy is seen throughout the muscles of the forefoot. There is a small amount of fluid seen surrounding the posterior tibialis tendon. The remainder of the flexor and extensor tendons are intact. Soft tissues Lateral plantar surface of the forefoot overlying the fifth metatarsal base there is a focal area of ulceration measuring approximately 8 mm in transverse dimension. A fluid-filled sinus tract is seen extending to the overlying osseous surface. There is a multilocular fluid collection which extends around the dorsal surface of the fifth metatarsal measuring approximately 5.4 x 0.9 x 3.4 cm. There is extensive dorsal and lateral subcutaneous edema and skin thickening. IMPRESSION: Superficial area of ulceration over the plantar lateral aspect of the fifth metatarsal base with a fluid-filled sinus tract and loculated probable abscess extending over the dorsal surface of the fifth metatarsal measuring 5.4 x 0.9 by is a 3.4 cm. Incomplete mildly angulated fourth and fifth metatarsal shaft fractures with periosteal reaction Findings which could be suggestive of  reactive marrow versus early osteomyelitis involving the base of the fourth and fifth metatarsals. Electronically Signed   By: Jonna Clark M.D.   On: 10/25/2019 19:06   DG Chest Port 1 View  Result Date: 10/26/2019 CLINICAL DATA:  Short of breath.  COVID positive EXAM: PORTABLE CHEST 1 VIEW COMPARISON:  10/25/2019 FINDINGS: Hypoventilation with decreased lung volume. Mild bibasilar airspace disease unchanged. No new infiltrate  or effusion. Right arm PICC tip in the SVC unchanged. IMPRESSION: Hypoventilation with bibasilar mild airspace disease unchanged. Electronically Signed   By: Marlan Palau M.D.   On: 10/26/2019 16:10   DG Chest Port 1 View  Result Date: 10/25/2019 CLINICAL DATA:  Shortness of breath. EXAM: PORTABLE CHEST 1 VIEW COMPARISON:  October 16, 2019. FINDINGS: The heart size and mediastinal contours are within normal limits. Hypoinflation of the lungs is noted with mild bibasilar subsegmental atelectasis. Endotracheal tube has been removed. Right-sided PICC line is unchanged in position. The visualized skeletal structures are unremarkable. IMPRESSION: Hypoinflation of the lungs with mild bibasilar subsegmental atelectasis. Electronically Signed   By: Lupita Raider M.D.   On: 10/25/2019 08:37   DG CHEST PORT 1 VIEW  Result Date: 10/16/2019 CLINICAL DATA:  Status post intubation. EXAM: PORTABLE CHEST 1 VIEW COMPARISON:  10/14/2019 FINDINGS: ET tube tip is above the carina. There is a right arm PICC line with tip at the cavoatrial junction. Lung volumes are low. Similar appearance of bilateral pulmonary opacities compatible with multifocal pneumonia. IMPRESSION: 1. Stable support apparatus. 2. Persistent bilateral pulmonary opacities compatible with multifocal pneumonia. Electronically Signed   By: Signa Kell M.D.   On: 10/16/2019 06:14   DG Chest Port 1 View  Result Date: 10/15/2019 CLINICAL DATA:  49 year old male with history of COVID infection. EXAM: PORTABLE CHEST 1 VIEW  COMPARISON:  Chest x-ray 10/14/2019. FINDINGS: An endotracheal tube is in place with tip 3.6 cm above the carina. A nasogastric tube is seen extending into the stomach, however, the tip of the nasogastric tube extends below the lower margin of the image. There is a right upper extremity PICC with tip terminating in the right atrium. Lung volumes are low. Widespread patchy areas of interstitial prominence an ill-defined airspace disease again noted throughout the lungs bilaterally. Overall, aeration is essentially unchanged. No pleural effusions. No evidence of pulmonary edema. No pneumothorax. Heart size is normal. Upper mediastinal contours are within normal limits. IMPRESSION: 1. Support apparatus, as above. 2. The appearance the chest is compatible with multilobar pneumonia from reported COVID infection. Electronically Signed   By: Trudie Reed M.D.   On: 10/15/2019 05:39   DG Chest Port 1 View  Result Date: 10/14/2019 CLINICAL DATA:  COVID-19, ARDS EXAM: PORTABLE CHEST 1 VIEW COMPARISON:  10/13/2019 FINDINGS: Single frontal view of the chest demonstrates stable position of the endotracheal tube and enteric catheter. There is a right-sided PICC tip overlying superior vena cava. The cardiac silhouette is stable. Interstitial and ground-glass opacities are seen throughout the lungs bilaterally, unchanged. Lung volumes are diminished. No effusion or pneumothorax. IMPRESSION: 1. 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 2. Stable support devices. Electronically Signed   By: Sharlet Salina M.D.   On: 10/14/2019 15:57   DG Chest Port 1 View  Result Date: 10/13/2019 CLINICAL DATA:  Acute respiratory failure with hypoxemia, COVID positive, asthma, diabetes mellitus, hypertension, CHF EXAM: PORTABLE CHEST 1 VIEW COMPARISON:  Portable exam 1021 hours compared to 10/12/2019 FINDINGS: Tip of endotracheal tube projects 3.7  cm above carina. Nasogastric tube extends into stomach. RIGHT arm PICC line tip projects over cavoatrial junction. Stable heart size mediastinal contours for technique. Patchy BILATERAL airspace infiltrates consistent with multifocal pneumonia, minimally improved. No pleural effusion or pneumothorax. IMPRESSION: Minimally improved pulmonary infiltrates. Electronically Signed   By: Ulyses Southward M.D.   On: 10/13/2019 10:37   DG CHEST PORT  1 VIEW  Result Date: 10/12/2019 CLINICAL DATA:  Intubation EXAM: PORTABLE CHEST 1 VIEW COMPARISON:  Earlier today FINDINGS: New endotracheal tube with tip at the clavicular heads, measuring 3 cm above the carina. Low volume chest with severe airspace disease. No visible effusion or air leak. Stable heart size which is distorted by positioning and volumes. IMPRESSION: 1. Unremarkable positioning of the endotracheal tube. 2. Stable extensive airspace disease with low lung volumes. Electronically Signed   By: Marnee Spring M.D.   On: 10/12/2019 07:13   DG CHEST PORT 1 VIEW  Result Date: 10/12/2019 CLINICAL DATA:  Shortness of breath EXAM: PORTABLE CHEST 1 VIEW COMPARISON:  Three days ago FINDINGS: Very low volume chest with diffuse and patchy pulmonary opacity. Vascular pedicle widening and prominent heart size largely related to technique. No visible effusion or pneumothorax. IMPRESSION: Worsening multifocal pneumonia.  Lung volumes remain very low. Electronically Signed   By: Marnee Spring M.D.   On: 10/12/2019 04:07   DG Chest Portable 1 View  Result Date: 10/09/2019 CLINICAL DATA:  Cough, fever, shortness of breath EXAM: PORTABLE CHEST 1 VIEW COMPARISON:  None. FINDINGS: Single frontal view of the chest demonstrates unremarkable cardiac silhouette. The lung volumes are diminished, with patchy bilateral areas of faint ground-glass airspace disease consistent with multifocal pneumonia. No effusion or pneumothorax. IMPRESSION: 1. Patchy bilateral ground-glass airspace  disease consistent with multifocal pneumonia. Electronically Signed   By: Sharlet Salina M.D.   On: 10/09/2019 19:32   DG Abd Portable 1V  Result Date: 10/20/2019 CLINICAL DATA:  GI problem. EXAM: PORTABLE ABDOMEN - 1 VIEW COMPARISON:  10/16/2019 FINDINGS: Feeding tube tip is in the distal stomach or proximal duodenum. Mild gaseous distention of the stomach. Gas throughout nondistended large and small bowel. No evidence of bowel obstruction, organomegaly or free air. IMPRESSION: Feeding tube tip in the distal stomach or proximal duodenum. No evidence of obstruction or free air. Electronically Signed   By: Charlett Nose M.D.   On: 10/20/2019 21:28   DG Abd Portable 1V  Result Date: 10/16/2019 CLINICAL DATA:  Status post OG tube placement EXAM: PORTABLE ABDOMEN - 1 VIEW COMPARISON:  10/16/2019 FINDINGS: The enteric tube tip projects over the distal body of stomach in the side port is below the level of the GE junction. A few air-filled loops of small bowel are noted within the upper abdomen which measure up to 2.8 cm. IMPRESSION: Satisfactory position of enteric tube. Electronically Signed   By: Signa Kell M.D.   On: 10/16/2019 06:52   DG Abd Portable 1V  Result Date: 10/12/2019 CLINICAL DATA:  Onychogryphosis EXAM: PORTABLE ABDOMEN - 1 VIEW COMPARISON:  Portable exam 0830 hours without priors for comparison FINDINGS: Tip of nasogastric tube projects over distal antrum. Nonobstructive bowel gas pattern. No bowel dilatation or bowel wall thickening. Patchy bibasilar infiltrates. No acute osseous findings. IMPRESSION: Tip of nasogastric tube projects over distal gastric antrum. Nonobstructive bowel gas pattern. Bibasilar pulmonary infiltrates. Electronically Signed   By: Ulyses Southward M.D.   On: 10/12/2019 08:51   DG Foot 2 Views Left  Result Date: 10/23/2019 CLINICAL DATA:  Bacteremia, LEFT foot pain and diabetic. EXAM: LEFT FOOT - 2 VIEW COMPARISON:  10/13/2019 and prior radiographs FINDINGS: Mildly  angulated fractures of the 4th and 5th metatarsals are again noted with fracture lines still evident. Healing changes along these fractures are noted. Overlying soft tissue swelling is noted. No new fracture, subluxation or dislocation noted. No definite radiographic evidence of acute osteomyelitis noted.  IMPRESSION: Mildly angulated healing fractures of the 4th and 5th metatarsals with soft tissue swelling again noted but without definite radiographic evidence of acute osteomyelitis. Consider MRI for further evaluation as clinically indicated. Electronically Signed   By: Harmon Pier M.D.   On: 10/23/2019 13:40   DG Foot 2 Views Left  Result Date: 10/13/2019 CLINICAL DATA:  50 year old male with diabetic foot. EXAM: LEFT FOOT - 2 VIEW COMPARISON:  Left foot radiograph dated 09/11/2019. FINDINGS: Evaluation is limited due to positioning. Fractures of the midportion of the fourth and fifth metacarpal with interval progression of bridging callus formation. No new fracture identified. There is no dislocation. There is diffuse soft tissue swelling of the foot. No radiopaque foreign object or soft tissue gas. IMPRESSION: 1. Healing fractures of the fourth and fifth metacarpal. 2. Diffuse soft tissue swelling. No radiopaque foreign object or soft tissue gas. Electronically Signed   By: Elgie Collard M.D.   On: 10/13/2019 16:49   EEG adult  Result Date: 10/27/2019 Charlsie Quest, MD     10/28/2019 10:13 AM Patient Name: Micheal Bell MRN: [id number] Epilepsy Attending: Charlsie Quest Referring Physician/Provider: Dr. [name] Date: 10/27/2019 Duration: 22.22 minutes Patient history: 49 year old male with altered mental status. EEG evaluate for seizures. Level of alertness: Awake, asleep AEDs during EEG study: Keppra Technical aspects: This EEG study was done with scalp electrodes positioned according to the 10-20 International system of electrode placement. Electrical activity was acquired at a sampling  rate of 500Hz  and reviewed with a high frequency filter of 70Hz  and a low frequency filter of 1Hz . EEG data were recorded continuously and digitally stored. Description: The posterior dominant rhythm consists of 8 Hz activity of moderate voltage (25-35 uV) seen predominantly in posterior head regions, symmetric and reactive to eye opening and eye closing. Sleep was characterized by vertex waves, sleep spindles (12 to 14 Hz), maximal frontocentral region. Frequent runs of sharp waves were seen in frontocentral region, maximal vertex lasting about 5 to 6 seconds consistent with brief ictal-interictal rhythmic discharges. Continuous generalized 5 to 6 Hz theta slowing was also noted. Hyperventilation and photic stimulation were not performed.   ABNORMALITY -Continuous slow, generalized -Brief ictal-interictal rhythmic discharges, bilateral frontocentral region, maximal vertex IMPRESSION: This study showed evidence of potential epileptogenicity arising from bilateral frontocentral, maximal vertex region. The sharp waves are at times rhythmic consistent with brief ictal-interictal rhythmic discharges. Additionally, there is evidence of mild diffuse encephalopathy, nonspecific etiology. Priyanka [name]   Overnight EEG with video  Result Date: 10/28/2019 Charlsie Quest, MD     10/29/2019  8:27 AM Patient Name: Micheal Bell MRN: [id number] Epilepsy Attending: Charlsie Quest Referring Physician/Provider: Dr. [name] Duration: 10/27/2019 1145 to 10/28/2019 1145  Patient history: 49 year old male with altered mental status. EEG evaluate for seizures.  Level of alertness: Awake, asleep AEDs during EEG study: Keppra  Technical aspects: This EEG study was done with scalp electrodes positioned according to the 10-20 International system of electrode placement. Electrical activity was acquired at a sampling rate of 500Hz  and reviewed with a high frequency filter of 70Hz  and a low frequency filter of 1Hz . EEG data  were recorded continuously and digitally stored.  Description: The posterior dominant rhythm consists of 8 Hz activity of moderate voltage (25-35 uV) seen predominantly in posterior head regions, symmetric and reactive to eye opening and eye closing. Sleep was characterized by vertex waves, sleep spindles (12 to 14 Hz), maximal frontocentral region. Initially, frequent runs  of sharp waves were seen in frontocentral region, maximal vertex lasting about 5 to 6 seconds consistent with brief ictal-interictal rhythmic discharges. Continuous generalized 5 to 6 Hz theta slowing as well as intermittent generalized rhythmic 2-3hz  delta slowing  was also noted. Hyperventilation and photic stimulation were not performed.    ABNORMALITY -Continuous slow, generalized - Intermittent rhythmic delta slowing, generalized -Brief ictal-interictal rhythmic discharges, bilateral frontocentral region, maximal vertex  IMPRESSION: This study initially showed evidence of potential epileptogenicity arising from bilateral frontocentral, maximal vertex region. The sharp waves are at times rhythmic consistent with brief ictal-interictal rhythmic discharges. Additionally, there is evidence of mild diffuse encephalopathy, nonspecific etiology. No seizures were seen during thi study. EEG appears to be improving compared to previous day.  Charlsie Quest   ECHOCARDIOGRAM COMPLETE  Result Date: 10/12/2019    ECHOCARDIOGRAM REPORT   Patient Name:   MALIKIAH DEBARR Date of Exam: 10/12/2019 Medical Rec #:  [id number]      Height:       74.0 in Accession #:    [id number]     Weight:       245.0 lb Date of Birth:  12-Dec-1970      BSA:          2.369 m Patient Age:    49 years       BP:           111/74 mmHg Patient Gender: M              HR:           75 bpm. Exam Location:  Inpatient Procedure: 2D Echo Indications:    CHF 428                  & Bacteremia 790.7  History:        Patient has no prior history of Echocardiogram examinations.                  CHF; Risk Factors:Hypertension and Diabetes. Covid +.  Sonographer:    Celene Skeen RDCS (AE) Referring Phys: [id number] Clarene Critchley [facility]  Sonographer Comments: Echo performed with patient supine and on artificial respirator. Image acquisition challenging due to respiratory motion and Image acquisition challenging due to patient body habitus. restricted mobility IMPRESSIONS  1. Extremely poor acoustic windows. LVEF appears to be depressed with diffuse hypokinesis, worse in the mid/distal inferior/inferoseptal walls. . Left ventricular ejection fraction, by estimation, is 30 to 35%. The left ventricle has moderately decreased function. The left ventricular internal cavity size was mildly dilated. Left ventricular diastolic parameters are indeterminate.  2. Right ventricular systolic function is moderately reduced. The right ventricular size is mildly enlarged.  3. The mitral valve is normal in structure. Trivial mitral valve regurgitation.  4. The aortic valve is normal in structure. Aortic valve regurgitation is not visualized. FINDINGS  Left Ventricle: Extremely poor acoustic windows. LVEF appears to be depressed with diffuse hypokinesis, worse in the mid/distal inferior/inferoseptal walls. Left ventricular ejection fraction, by estimation, is 30 to 35%. The left ventricle has moderately decreased function. The left ventricle demonstrates regional wall motion abnormalities. The left ventricular internal cavity size was mildly dilated. There is no left ventricular hypertrophy. Left ventricular diastolic parameters are indeterminate. Right Ventricle: The right ventricular size is mildly enlarged. Right vetricular wall thickness was not assessed. Right ventricular systolic function is moderately reduced. Left Atrium: Left atrial size was normal in size. Right Atrium: Right atrial size was normal in size. Pericardium:  There is no evidence of pericardial effusion. Mitral Valve: The mitral valve is normal in structure.  Trivial mitral valve regurgitation. Tricuspid Valve: The tricuspid valve is normal in structure. Tricuspid valve regurgitation is trivial. Aortic Valve: The aortic valve is normal in structure. Aortic valve regurgitation is not visualized. Pulmonic Valve: The pulmonic valve was grossly normal. Pulmonic valve regurgitation is trivial. Aorta: The aortic root is normal in size and structure. IAS/Shunts: The interatrial septum was not assessed.  LEFT VENTRICLE PLAX 2D LVIDd:         5.60 cm  Diastology LVIDs:         3.90 cm  LV e' lateral:   8.81 cm/s LV PW:         1.40 cm  LV E/e' lateral: 5.7 LV IVS:        1.10 cm  LV e' medial:    5.87 cm/s LVOT diam:     2.40 cm  LV E/e' medial:  8.5 LV SV:         64 LV SV Index:   27 LVOT Area:     4.52 cm  RIGHT VENTRICLE RV S prime:     9.79 cm/s TAPSE (M-mode): 1.3 cm LEFT ATRIUM             Index       RIGHT ATRIUM           Index LA diam:        3.10 cm 1.31 cm/m  RA Area:     18.80 cm LA Vol (A2C):   62.9 ml 26.55 ml/m RA Volume:   51.80 ml  21.86 ml/m LA Vol (A4C):   40.8 ml 17.22 ml/m LA Biplane Vol: 50.8 ml 21.44 ml/m  AORTIC VALVE LVOT Vmax:   69.80 cm/s LVOT Vmean:  49.400 cm/s LVOT VTI:    0.142 m  AORTA Ao Root diam: 3.60 cm MITRAL VALVE MV Area (PHT): 2.34 cm    SHUNTS MV Decel Time: 324 msec    Systemic VTI:  0.14 m MV E velocity: 50.10 cm/s  Systemic Diam: 2.40 cm MV A velocity: 71.60 cm/s MV E/A ratio:  0.70 Dietrich Pates MD Electronically signed by Dietrich Pates MD Signature Date/Time: 10/12/2019/1:58:41 PM    Final    ECHO TEE  Result Date: 10/24/2019    TRANSESOPHOGEAL ECHO REPORT   Patient Name:   Micheal Bell Date of Exam: 10/24/2019 Medical Rec #:  [id number]      Height:       74.0 in Accession #:    [id number]     Weight:       247.4 lb Date of Birth:  1970-11-22      BSA:          2.379 m Patient Age:    49 years       BP:           120/80 mmHg Patient Gender: M              HR:           100 bpm. Exam Location:  Inpatient Procedure:  Transesophageal Echo Indications:    Emobli/Bacteremia  History:        Patient has prior history of Echocardiogram examinations.  Sonographer:    Ross Ludwig RDCS (AE) Referring Phys: [phone] HAO MENG PROCEDURE: The transesophogeal probe was passed without difficulty through the esophogus of the patient. Sedation performed by different physician. The patient's vital signs; including heart rate,  blood pressure, and oxygen saturation; remained stable throughout the procedure. The patient developed no complications during the procedure. IMPRESSIONS  1. Left ventricular ejection fraction, by estimation, is 50 to 55%. The left ventricle has low normal function. The left ventricle has no regional wall motion abnormalities.  2. Right ventricular systolic function is normal. The right ventricular size is normal.  3. No left atrial/left atrial appendage thrombus was detected.  4. The mitral valve is normal in structure. Trivial mitral valve regurgitation. No evidence of mitral stenosis.  5. The aortic valve is normal in structure. Aortic valve regurgitation is not visualized. No aortic stenosis is present.  6. The inferior vena cava is normal in size with greater than 50% respiratory variability, suggesting right atrial pressure of 3 mmHg. Conclusion(s)/Recommendation(s): Normal biventricular function without evidence of hemodynamically significant valvular heart disease. FINDINGS  Left Ventricle: Left ventricular ejection fraction, by estimation, is 50 to 55%. The left ventricle has low normal function. The left ventricle has no regional wall motion abnormalities. The left ventricular internal cavity size was normal in size. There is no left ventricular hypertrophy. Right Ventricle: The right ventricular size is normal. No increase in right ventricular wall thickness. Right ventricular systolic function is normal. Left Atrium: Left atrial size was normal in size. No left atrial/left atrial appendage thrombus was detected.  Right Atrium: Right atrial size was normal in size. Pericardium: There is no evidence of pericardial effusion. Mitral Valve: The mitral valve is normal in structure. Normal mobility of the mitral valve leaflets. Trivial mitral valve regurgitation. No evidence of mitral valve stenosis. There is no evidence of mitral valve vegetation. Tricuspid Valve: The tricuspid valve is normal in structure. Tricuspid valve regurgitation is mild . No evidence of tricuspid stenosis. There is no evidence of tricuspid valve vegetation. Aortic Valve: The aortic valve is normal in structure. Aortic valve regurgitation is not visualized. No aortic stenosis is present. There is no evidence of aortic valve vegetation. Pulmonic Valve: The pulmonic valve was normal in structure. Pulmonic valve regurgitation is trivial. No evidence of pulmonic stenosis. Aorta: The aortic root is normal in size and structure. Venous: The inferior vena cava is normal in size with greater than 50% respiratory variability, suggesting right atrial pressure of 3 mmHg. IAS/Shunts: No atrial level shunt detected by color flow Doppler. [name] MD Electronically signed by [name] MD Signature Date/Time: 10/24/2019/3:28:45 PM    Final    VAS US CAROTID (at [facility] and [facility] only)  Result Date: 10/26/2019 Carotid Arterial Duplex Study Indications:       39mm focus of diffusion restriction in the left                    frontoparietal lobe concerning for acute/early subacute                    infarct vs cerebritis. Risk Factors:      Hypertension, Diabetes. Other Factors:     Covid-19, bacteremia. Comparison Study:  No prior study on file Performing Technologist: Sherren Kerns RVS  Examination Guidelines: A complete evaluation includes B-mode imaging, spectral Doppler, color Doppler, and power Doppler as needed of all accessible portions of each vessel. Bilateral testing is considered an integral part of a complete examination. Limited examinations for reoccurring  indications may be performed as noted.  Right Carotid Findings: +----------+--------+--------+--------+------------------+--------+           PSV cm/sEDV cm/sStenosisPlaque DescriptionComments +----------+--------+--------+--------+------------------+--------+ CCA Prox  66  11                                         +----------+--------+--------+--------+------------------+--------+ CCA Distal71      18                                         +----------+--------+--------+--------+------------------+--------+ ICA Prox  58      19                                         +----------+--------+--------+--------+------------------+--------+ ICA Distal90      33                                         +----------+--------+--------+--------+------------------+--------+ ECA       114     16                                         +----------+--------+--------+--------+------------------+--------+ +----------+--------+-------+--------+-------------------+           PSV cm/sEDV cmsDescribeArm Pressure (mmHG) +----------+--------+-------+--------+-------------------+ [id number]                                        +----------+--------+-------+--------+-------------------+ +---------+--------+--+--------+--+ VertebralPSV cm/s38EDV cm/s11 +---------+--------+--+--------+--+  Left Carotid Findings: +----------+--------+--------+--------+------------------+--------+           PSV cm/sEDV cm/sStenosisPlaque DescriptionComments +----------+--------+--------+--------+------------------+--------+ CCA Prox  55      14                                         +----------+--------+--------+--------+------------------+--------+ CCA Distal65      20                                         +----------+--------+--------+--------+------------------+--------+ ICA Prox  68      20                                          +----------+--------+--------+--------+------------------+--------+ ICA Distal76      23                                         +----------+--------+--------+--------+------------------+--------+ ECA       77      3                                          +----------+--------+--------+--------+------------------+--------+ +----------+--------+--------+--------+-------------------+           PSV cm/sEDV cm/sDescribeArm Pressure (  mmHG) +----------+--------+--------+--------+-------------------+ Subclavian88                                          +----------+--------+--------+--------+-------------------+ +---------+--------+--+--------+--+ VertebralPSV cm/s63EDV cm/s18 +---------+--------+--+--------+--+   Summary: Right Carotid: The extracranial vessels were near-normal with only minimal wall                thickening or plaque. Left Carotid: The extracranial vessels were near-normal with only minimal wall               thickening or plaque. Vertebrals:  Bilateral vertebral arteries demonstrate antegrade flow. Subclavians: Normal flow hemodynamics were seen in bilateral subclavian              arteries. *See table(s) above for measurements and observations.  Electronically signed by Delia Heady MD on 10/26/2019 at 12:37:48 PM.    Final    VAS [place] LOWER EXTREMITY ARTERIAL DUPLEX  Result Date: 10/26/2019 LOWER EXTREMITY ARTERIAL DUPLEX STUDY Indications: Ulceration, and Osteomyelitis. High Risk Factors: Hypertension, Diabetes. Other Factors: Covid-19. Charcot foot.  Current ABI: N/A Comparison Study: No prior study Performing Technologist: Sherren Kerns RVS  Examination Guidelines: A complete evaluation includes B-mode imaging, spectral Doppler, color Doppler, and power Doppler as needed of all accessible portions of each vessel. Bilateral testing is considered an integral part of a complete examination. Limited examinations for reoccurring indications may be performed as noted.   +----------+--------+-----+--------+---------+--------+ LEFT      PSV cm/sRatioStenosisWaveform Comments +----------+--------+-----+--------+---------+--------+ CFA Prox  123                  triphasic         +----------+--------+-----+--------+---------+--------+ DFA       51                   triphasic         +----------+--------+-----+--------+---------+--------+ SFA Prox  97                   triphasic         +----------+--------+-----+--------+---------+--------+ SFA Mid   88                   triphasic         +----------+--------+-----+--------+---------+--------+ SFA Distal81                   triphasic         +----------+--------+-----+--------+---------+--------+ POP Prox  60                   triphasic         +----------+--------+-----+--------+---------+--------+ POP Distal86                   triphasic         +----------+--------+-----+--------+---------+--------+ ATA Distal110                  triphasic         +----------+--------+-----+--------+---------+--------+ PTA Prox  34                   triphasic         +----------+--------+-----+--------+---------+--------+ PTA Mid   29                   triphasic         +----------+--------+-----+--------+---------+--------+ PTA [id number]  triphasic         +----------+--------+-----+--------+---------+--------+  Summary: Left: Normal waveforms and adequate flow noted.  See table(s) above for measurements and observations. Electronically signed by Lemar Livings MD on 10/26/2019 at 4:50:14 PM.    Final    VAS [place] LOWER EXTREMITY ARTERIAL DUPLEX  Result Date: 10/19/2019 LOWER EXTREMITY ARTERIAL DUPLEX STUDY Indications: Cold leg, Covid-19.  Current ABI: N/A Comparison Study: No prior study Performing Technologist: Sherren Kerns RVS  Examination Guidelines: A complete evaluation includes B-mode imaging, spectral Doppler, color Doppler, and power Doppler  as needed of all accessible portions of each vessel. Bilateral testing is considered an integral part of a complete examination. Limited examinations for reoccurring indications may be performed as noted.  +-----------+--------+-----+--------+---------+--------+ RIGHT      PSV cm/sRatioStenosisWaveform Comments +-----------+--------+-----+--------+---------+--------+ CFA Prox   67                   triphasic         +-----------+--------+-----+--------+---------+--------+ DFA        60                   triphasic         +-----------+--------+-----+--------+---------+--------+ SFA Prox   68                   triphasic         +-----------+--------+-----+--------+---------+--------+ SFA Mid    62                   triphasic         +-----------+--------+-----+--------+---------+--------+ SFA Distal 69                   triphasic         +-----------+--------+-----+--------+---------+--------+ POP Prox   47                   triphasic         +-----------+--------+-----+--------+---------+--------+ POP Distal 42                   triphasic         +-----------+--------+-----+--------+---------+--------+ ATA Distal 15                   triphasic         +-----------+--------+-----+--------+---------+--------+ PTA Prox   31                   triphasic         +-----------+--------+-----+--------+---------+--------+ PTA Mid    38                   triphasic         +-----------+--------+-----+--------+---------+--------+ PTA Distal 38                   triphasic         +-----------+--------+-----+--------+---------+--------+ PERO Distal36                   triphasic         +-----------+--------+-----+--------+---------+--------+  Summary: Right: Normal examination. No evidence of arterial occlusive disease. Normal waveforms, no stenosis.  See table(s) above for measurements and observations. Electronically signed by Sherald Hess MD on 10/19/2019 at 3:25:02 PM.    Final    VAS [place] LOWER EXTREMITY VENOUS (DVT)  Result Date: 10/26/2019  Lower Venous DVTStudy Indications: Covid-19, elevated D-Dimer.  Comparison Study: Prior negative left lower extremity venous duplex don 03/19/17  and is available for comparison. Performing Technologist: Sherren Kerns RVS  Examination Guidelines: A complete evaluation includes B-mode imaging, spectral Doppler, color Doppler, and power Doppler as needed of all accessible portions of each vessel. Bilateral testing is considered an integral part of a complete examination. Limited examinations for reoccurring indications may be performed as noted. The reflux portion of the exam is performed with the patient in reverse Trendelenburg.  +---------+---------------+---------+-----------+----------+--------------+ RIGHT    CompressibilityPhasicitySpontaneityPropertiesThrombus Aging +---------+---------------+---------+-----------+----------+--------------+ CFV      Full           Yes      Yes                                 +---------+---------------+---------+-----------+----------+--------------+ SFJ      Full                                                        +---------+---------------+---------+-----------+----------+--------------+ FV Prox  Full                                                        +---------+---------------+---------+-----------+----------+--------------+ FV Mid   Full                                                        +---------+---------------+---------+-----------+----------+--------------+ FV DistalFull                                                        +---------+---------------+---------+-----------+----------+--------------+ PFV      Full                                                        +---------+---------------+---------+-----------+----------+--------------+ POP      Partial        Yes      Yes                   Acute          +---------+---------------+---------+-----------+----------+--------------+ PTV      Full                                                        +---------+---------------+---------+-----------+----------+--------------+ PERO     Full                                                        +---------+---------------+---------+-----------+----------+--------------+   +---------+---------------+---------+-----------+----------+--------------+  LEFT     CompressibilityPhasicitySpontaneityPropertiesThrombus Aging +---------+---------------+---------+-----------+----------+--------------+ CFV      Full           Yes      Yes                                 +---------+---------------+---------+-----------+----------+--------------+ SFJ      Full                                                        +---------+---------------+---------+-----------+----------+--------------+ FV Prox  Full                                                        +---------+---------------+---------+-----------+----------+--------------+ FV Mid   Full                                                        +---------+---------------+---------+-----------+----------+--------------+ FV DistalFull                                                        +---------+---------------+---------+-----------+----------+--------------+ PFV      Full                                                        +---------+---------------+---------+-----------+----------+--------------+ POP      Full           Yes      Yes                                 +---------+---------------+---------+-----------+----------+--------------+ PTV      Full                                                        +---------+---------------+---------+-----------+----------+--------------+ PERO     Full                                                         +---------+---------------+---------+-----------+----------+--------------+     Summary: RIGHT: - Findings consistent with acute deep vein thrombosis involving the right popliteal vein. - Ultrasound characteristics of enlarged lymph nodes are noted in the groin.  LEFT: - There is no evidence of deep vein thrombosis in the lower extremity.  *  See table(s) above for measurements and observations. Electronically signed by Lemar Livings MD on 10/26/2019 at 4:50:27 PM.    Final    [place] EKG SITE RITE  Result Date: 10/12/2019 If Site Rite image not attached, placement could not be confirmed due to current cardiac rhythm.

## 2019-11-05 LAB — CBC WITH DIFFERENTIAL/PLATELET
Abs Immature Granulocytes: 0.1 10*3/uL — ABNORMAL HIGH (ref 0.00–0.07)
Basophils Absolute: 0 10*3/uL (ref 0.0–0.1)
Basophils Relative: 0 %
Eosinophils Absolute: 0.5 10*3/uL (ref 0.0–0.5)
Eosinophils Relative: 5 %
HCT: 30.9 % — ABNORMAL LOW (ref 39.0–52.0)
Hemoglobin: 10.1 g/dL — ABNORMAL LOW (ref 13.0–17.0)
Immature Granulocytes: 1 %
Lymphocytes Relative: 31 %
Lymphs Abs: 3 10*3/uL (ref 0.7–4.0)
MCH: 30.3 pg (ref 26.0–34.0)
MCHC: 32.7 g/dL (ref 30.0–36.0)
MCV: 92.8 fL (ref 80.0–100.0)
Monocytes Absolute: 0.8 10*3/uL (ref 0.1–1.0)
Monocytes Relative: 9 %
Neutro Abs: 5.2 10*3/uL (ref 1.7–7.7)
Neutrophils Relative %: 54 %
Platelets: 353 10*3/uL (ref 150–400)
RBC: 3.33 MIL/uL — ABNORMAL LOW (ref 4.22–5.81)
RDW: 13.2 % (ref 11.5–15.5)
WBC: 9.6 10*3/uL (ref 4.0–10.5)
nRBC: 0 % (ref 0.0–0.2)

## 2019-11-05 LAB — COMPREHENSIVE METABOLIC PANEL
ALT: 17 U/L (ref 0–44)
AST: 25 U/L (ref 15–41)
Albumin: 2.2 g/dL — ABNORMAL LOW (ref 3.5–5.0)
Alkaline Phosphatase: 84 U/L (ref 38–126)
Anion gap: 10 (ref 5–15)
BUN: 5 mg/dL — ABNORMAL LOW (ref 6–20)
CO2: 25 mmol/L (ref 22–32)
Calcium: 8.9 mg/dL (ref 8.9–10.3)
Chloride: 100 mmol/L (ref 98–111)
Creatinine, Ser: 0.76 mg/dL (ref 0.61–1.24)
GFR calc Af Amer: >60 mL/min (ref >60)
GFR calc non Af Amer: >60 mL/min (ref >60)
Glucose, Bld: 155 mg/dL — ABNORMAL HIGH (ref 70–99)
Potassium: 4 mmol/L (ref 3.5–5.1)
Sodium: 135 mmol/L (ref 135–145)
Total Bilirubin: 0.7 mg/dL (ref 0.3–1.2)
Total Protein: 7.3 g/dL (ref 6.5–8.1)

## 2019-11-05 LAB — GLUCOSE, CAPILLARY
Glucose-Capillary: 125 mg/dL — ABNORMAL HIGH (ref 70–99)
Glucose-Capillary: 150 mg/dL — ABNORMAL HIGH (ref 70–99)
Glucose-Capillary: 158 mg/dL — ABNORMAL HIGH (ref 70–99)
Glucose-Capillary: 179 mg/dL — ABNORMAL HIGH (ref 70–99)
Glucose-Capillary: 184 mg/dL — ABNORMAL HIGH (ref 70–99)
Glucose-Capillary: 200 mg/dL — ABNORMAL HIGH (ref 70–99)
Glucose-Capillary: 206 mg/dL — ABNORMAL HIGH (ref 70–99)

## 2019-11-05 LAB — C-REACTIVE PROTEIN: CRP: 9.8 mg/dL — ABNORMAL HIGH (ref <1.0)

## 2019-11-05 LAB — BRAIN NATRIURETIC PEPTIDE: B Natriuretic Peptide: 36 pg/mL (ref 0.0–100.0)

## 2019-11-05 LAB — MAGNESIUM: Magnesium: 1.6 mg/dL — ABNORMAL LOW (ref 1.7–2.4)

## 2019-11-05 LAB — PROCALCITONIN: Procalcitonin: <0.1 ng/mL

## 2019-11-05 LAB — D-DIMER, QUANTITATIVE: D-Dimer, Quant: 5.95 ug/mL-FEU — ABNORMAL HIGH (ref 0.00–0.50)

## 2019-11-05 MED ORDER — MAGNESIUM SULFATE 4 GM/100ML IV SOLN
4.0000 g | Freq: Once | INTRAVENOUS | Status: AC
  Administered 2019-11-05: 4 g via INTRAVENOUS
  Filled 2019-11-05: qty 100

## 2019-11-05 MED ORDER — CYCLOBENZAPRINE HCL 5 MG PO TABS
5.0000 mg | ORAL_TABLET | Freq: Two times a day (BID) | ORAL | Status: DC | PRN
  Administered 2019-11-06 – 2019-11-14 (×10): 5 mg via ORAL
  Filled 2019-11-05 (×11): qty 1

## 2019-11-05 MED ORDER — ENOXAPARIN SODIUM 120 MG/0.8ML ~~LOC~~ SOLN
120.0000 mg | Freq: Two times a day (BID) | SUBCUTANEOUS | Status: DC
  Administered 2019-11-05 – 2019-11-06 (×3): 120 mg via SUBCUTANEOUS
  Filled 2019-11-05 (×5): qty 0.8

## 2019-11-05 MED ORDER — MAGNESIUM SULFATE 2 GM/50ML IV SOLN
2.0000 g | Freq: Once | INTRAVENOUS | Status: AC
  Administered 2019-11-05: 2 g via INTRAVENOUS
  Filled 2019-11-05: qty 50

## 2019-11-05 NOTE — Progress Notes (Addendum)
ANTICOAGULATION CONSULT NOTE  Pharmacy Consult for Lovenox Indication: DVT  Allergies  Allergen Reactions  . Azithromycin   . Lodine [Etodolac] Anaphylaxis    Patient Measurements: Height: 6\' 2"  (188 cm) Weight: 116.1 kg (255 lb 15.3 oz) IBW/kg (Calculated) : 82.2   Vital Signs: Temp: 98.4 F (36.9 C) (09/12 0738) Temp Source: Oral (09/12 0738) BP: 113/72 (09/12 0738) Pulse Rate: 90 (09/12 0738)  Labs: Recent Labs    11/03/19 0356 11/05/19 0356  HGB 9.6* 10.1*  HCT 30.2* 30.9*  PLT 328 353  CREATININE 0.70 0.76    Estimated Creatinine Clearance: 148 mL/min (by C-G formula based on SCr of 0.76 mg/dL).   Assessment: 49 y.o.M with new RLE DVT per duplex 9/2 pm (prior duplex 8/27 neg), started on Enox 1mg /kg/Q12 on 9/3.   Patient with Hgb persistently in 9-10s and Plt 353 today. D-dimer remains elevated at 5.95. No signs of bleeding noted. Previously had decreased Lovenox to 110 mg q12h given weight loss but returned to previous weight and maintained over the fast few days. Plan to increase to 120 mg q12h today and monitor.   Goal of Therapy:  Anti-Xa level 0.6-1 units/ml 4hrs after LMWH dose given Monitor platelets by anticoagulation protocol: Yes   Plan:  Increase Lovenox to 120mg  SQ q12hrs with weight gain. CBC at least q72h on LMWH Monitor for s/sx of bleeding   11/3, PharmD PGY1 Acute Care Pharmacy Resident 11/05/2019 7:49 AM  Please check [email or web address] for unit-specific pharmacy phone numbers.

## 2019-11-05 NOTE — Progress Notes (Signed)
PROGRESS NOTE                                                                                                                                                                                                             Patient Demographics:    Micheal Bell, is a 49 y.o. male, DOB - 1971/02/11, WUJ:[id number]  Outpatient Primary MD for the patient is [name], MD    LOS - 24  Admit date - 10/09/2019    Chief Complaint  Patient presents with  . Cough       Brief Narrative - 49 yo male with hx of HTN, T2DM, gout, IBS, fibromyalgia, obesity who presented on 8/16 for COVID pneumonia.  He decompensated on 8/19 and was intubated.  Self extubated on 8/23 and was reintubated.  He's subsequently been extubated on 8/28.  He's been on typical therapies for covid including steroids, baricitinib and remdesivir.  His hospitalization was complicated by MSSA and GBS bacteria.  Also found to have systolic heart failure.    Subjective:   Patient in bed, appears comfortable, denies any headache, no fever, no chest pain or pressure, no shortness of breath , no abdominal pain. No focal weakness.   Assessment  & Plan :   Acute Hypoxic Resp. Failure due to Acute Covid 19 Viral Pneumonitis during the ongoing 2020 Covid 19 Pandemic - he had severe hypoxia and parenchymal lung injury he was intubated and admitted by ICU, he was started on IV steroids, remdesivir and Baricitinib on 10/12/2019.  He was subsequently extubated on 10/21/2019.  His stay was complicated by bacteremia after which Baricitinib was discontinued on 10/24/2019.  His pulmonary status is gradually improving, currently he is stable and symptom-free, he is on room air, I have discussed with him about incentive spirometry, flutter valve, pulmonary toiletry, he is now off of isolation and his pulmonary issues from COVID-19 is clinically resolved.   Recent Labs  Lab 10/30/19 0320  10/31/19 0411 11/01/19 0152 11/02/19 0342 11/03/19 0356 11/05/19 0356 11/05/19 0357  WBC  --  8.3 8.7 8.0 8.6 9.6  --   PLT  --  338 342 322 328 353  --   CRP  --   --   --   --   --  9.8*  --   BNP  --   --   --   --   --   --  36.0  DDIMER  --   --   --   --   --  5.95*  --   PROCALCITON <0.10  --   --   --   --  <0.10  --   AST  --  18 19 17 17 25   --   ALT  --  17 18 17 17 17   --   ALKPHOS  --  85 89 92 85 84  --   BILITOT  --  0.7 0.6 0.7 0.8 0.7  --   ALBUMIN  --  2.1* 2.0* 2.1* 2.1* 2.2*  --     Sepsis with MSSA and Group B strep bacteremia due to left foot abscess.  ID following, was on IV Ancef but switch to nafcillin on 10/26/2019.  Clean TEE, history of Charcot joints, peripheral neuropathy and recent left foot soft tissue injury, MRI left foot shows an abscess, Ortho was consulted and they recommended medical management with outpatient follow-up with Dr. [name] post discharge, also has osteomyelitis, defer duration of treatment with antibiotics to ID.  Antibiotics management per ID, plan to continue total of 4 weeks of IV nafcillin, through 9/29, then start Keflex 500 mg 4 times daily, he has a follow-up arranged with Dr. [name] on October 13 at 10:30 AM .  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  For seizures he has been started on Keppra with good effect and his mentation has improved considerably. Continue PT OT and speech, SNF VS CIR .  Neuro has seen the patient this admission.   Possible systolic heart failure as shown by TTE however this could have been transient due to sepsis, on repeat TEE his EF is now normalized to 50 to 55%.  He is  currently compensated, blood pressure was too low and after hydration has improved, continue beta-blocker.   Cyanotic right lower extremity toes.  Likely due to hypoperfusion which is transient, arterial ultrasound stable. Resolved.   History of chronic pain.  On multiple narcotics, Flexeril and benzodiazepines.  Was off of these medications for several days without any discomfort, reintroduced on his request at very low doses.  Do not escalate.  HTN - BP was low on multiple blood pressure medications, was hydrated now able to tolerate low-dose beta-blocker.  Acute DVT in the right lower extremity.  Switched to full dose Lovenox.   Hypomagnesemia.  Replaced aggressively again on 11/05/2019 will monitor.  DM type II.  Currently on Lantus and sliding scale monitor.  Lab Results  Component Value Date   HGBA1C 11.3 (H) 10/26/2019   CBG (last 3)  Recent Labs    11/05/19 0003 11/05/19 0409 11/05/19 0727  GLUCAP 206* 158* 125*      Condition -   Guarded  Family Communication  :  Sister and mother [phone] - 10/25/19, 10/27/19, 10/28/19, 11/03/19  Code Status :  Full  Consults  :  PCCM, ID, Cards  Procedures  :    EEG - This study showed evidence of potential epileptogenicity arising from bilateral frontocentral, maximal vertex region. The sharp waves are at times rhythmic consistent with brief ictal-interictal rhythmic discharges. Additionally,  there is evidence of mild diffuse encephalopathy, nonspecific etiology.  Bilateral lower extremity venous ultrasound - Acute right lower extremity popliteal vein DVT.  Carotid duplex.  Nonacute.    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  CT Head -  1. No acute intracranial abnormality. 2. Area of subcortical low-density involving the posterior left frontal lobe at the convexity, likely encephalomalacia and sequela of remote infarct. There are  no prior exams for comparison to establish chronicity. MRI could be considered for further evaluation based on clinical concern. 3. Paranasal sinus fluid levels on opacification of the right greater than left mastoid air cells, possibly related to intubation.  R. Leg Arterial US -  No evidence of arterial occlusive disease. Normal waveforms, no stenosis  TTE -  1. Extremely poor acoustic windows. LVEF appears to be depressed with diffuse hypokinesis, worse in the mid/distal inferior/inferoseptal walls. . Left ventricular ejection fraction, by estimation, is 30 to 35%. The left ventricle has moderately decreased function. The left ventricular internal cavity size was mildly dilated. Left ventricular diastolic parameters are indeterminate.  2. Right ventricular systolic function is moderately reduced. The right ventricular size is mildly enlarged.  3. The mitral valve is normal in structure. Trivial mitral valve regurgitation.  4. The aortic valve is normal in structure. Aortic valve regurgitation is not visualized  TEE - 1. Left ventricular ejection fraction, by estimation, is 50 to 55%. The left ventricle has low normal function. The left ventricle has no regional wall motion abnormalities.  2. Right ventricular systolic function is normal. The right ventricular size is normal.  3. No left atrial/left atrial appendage thrombus was detected.  4. The mitral valve is normal in structure. Trivial mitral valve regurgitation. No evidence of mitral stenosis.  5. The aortic valve is normal in structure. Aortic valve regurgitation is not visualized. No aortic stenosis is present.  6. The inferior vena cava is normal in size with greater than 50% respiratory variability, suggesting right atrial pressure of 3 mmHg.  CT - 1. No acute intracranial abnormality. 2. Area of subcortical low-density involving the posterior left frontal lobe at the convexity, likely encephalomalacia and sequela of remote infarct. There are no  prior exams for comparison to establish chronicity. MRI could be considered for further evaluation based on clinical concern. 3. Paranasal sinus fluid levels on opacification of the right greater than left mastoid air cells, possibly related to intubation    PUD Prophylaxis : PPI  Disposition Plan  :    Status is: Inpatient  Remains inpatient appropriate because:IV treatments appropriate due to intensity of illness or inability to take PO   Dispo: The patient is from: Home              Anticipated d/c is to: Home              Anticipated d/c date is: 1 day              Patient currently is medically stable to d/c. to CIR when bed is available.   DVT Prophylaxis  :  Lovenox    Lab Results  Component Value Date   PLT 353 11/05/2019    Diet :  Diet Order            Diet heart healthy/carb modified Room service appropriate? No; Fluid consistency: Thin  Diet effective now  Inpatient Medications  Scheduled Meds: . alteplase  2 mg Intracatheter Once  . atorvastatin  40 mg Oral Daily  . buPROPion  150 mg Oral Daily  . Chlorhexidine Gluconate Cloth  6 each Topical Daily  . docusate sodium  100 mg Oral BID  . enoxaparin (LOVENOX) injection  120 mg Subcutaneous Q12H  . feeding supplement (ENSURE ENLIVE)  237 mL Oral TID BM  . insulin aspart  0-15 Units Subcutaneous Q4H  . insulin glargine  20 Units Subcutaneous Daily  . levETIRAcetam  500 mg Oral BID  . linagliptin  5 mg Oral Daily  . melatonin  3 mg Oral QHS  . metoprolol tartrate  50 mg Oral BID  . pantoprazole  40 mg Oral Daily  . polyethylene glycol  17 g Oral Daily  . senna  2 tablet Oral BID  . sodium chloride flush  10-40 mL Intracatheter Q12H   Continuous Infusions: . sodium chloride 10 mL/hr at 11/02/19 0600  . magnesium sulfate bolus IVPB    . magnesium sulfate bolus IVPB    . nafcillin (NAFCIL) continuous infusion 12 g (11/03/19 1000)   PRN Meds:.sodium chloride, acetaminophen, bisacodyl,  cyclobenzaprine, HYDROcodone-acetaminophen, Ipratropium-Albuterol  Antibiotics  :    Anti-infectives (From admission, onward)   Start     Dose/Rate Route Frequency Ordered Stop   10/31/19 1415  nafcillin 12 g in sodium chloride 0.9 % 500 mL continuous infusion  Status:  Discontinued        12 g 20.8 mL/hr over 24 Hours Intravenous Every 24 hours 10/31/19 0608 10/31/19 0609   10/29/19 1415  nafcillin 12 g in dextrose 5 % 500 mL IVPB  Status:  Discontinued        12 g 20.8 mL/hr over 24 Hours Intravenous Every 24 hours 10/29/19 0434 10/29/19 0507   10/29/19 1415  nafcillin 12 g in sodium chloride 0.9 % 500 mL continuous infusion        12 g 20.8 mL/hr over 24 Hours Intravenous Every 24 hours 10/29/19 0507     10/26/19 1415  nafcillin 12 g in dextrose 5 % 500 mL IVPB  Status:  Discontinued        12 g 20.8 mL/hr over 24 Hours Intravenous Every 24 hours 10/26/19 1414 10/29/19 0434   10/26/19 1400  nafcillin injection 12 g  Status:  Discontinued        12 g Intravenous Daily 10/26/19 1238 10/26/19 1242   10/26/19 1400  nafcillin 12 g in dextrose 5 % 50 mL IVPB  Status:  Discontinued        12 g 100 mL/hr over 30 Minutes Intravenous Every 24 hours 10/26/19 1242 10/26/19 1339   10/26/19 1400  nafcillin 12 g in dextrose 5 % 500 mL IVPB  Status:  Discontinued        12 g 20.8 mL/hr over 24 Hours Intravenous Every 24 hours 10/26/19 1337 10/26/19 1416   10/26/19 1200  nafcillin 2 g in sodium chloride 0.9 % 100 mL IVPB  Status:  Discontinued        2 g 200 mL/hr over 30 Minutes Intravenous Every 4 hours 10/26/19 1010 10/26/19 1238   10/12/19 2200  cefTRIAXone (ROCEPHIN) 2 g in sodium chloride 0.9 % 100 mL IVPB  Status:  Discontinued        2 g 200 mL/hr over 30 Minutes Intravenous Every 24 hours 10/12/19 0441 10/12/19 0921   10/12/19 0930  ceFAZolin (ANCEF) IVPB 2g/100 mL premix  Status:  Discontinued        2 g 200 mL/hr over 30 Minutes Intravenous Every 8 hours 10/12/19 0921 10/26/19 1010    10/11/19 1000  remdesivir 100 mg in sodium chloride 0.9 % 100 mL IVPB       "Followed by" Linked Group Details   100 mg 200 mL/hr over 30 Minutes Intravenous Daily 10/10/19 0050 10/14/19 0956   10/11/19 0800  vancomycin (VANCOCIN) IVPB 1000 mg/200 mL premix  Status:  Discontinued        1,000 mg 200 mL/hr over 60 Minutes Intravenous Every 8 hours 10/10/19 2239 10/12/19 0921   10/10/19 2230  vancomycin (VANCOCIN) IVPB 1000 mg/200 mL premix        1,000 mg 200 mL/hr over 60 Minutes Intravenous Every 1 hr x 2 10/10/19 2208 10/11/19 0052   10/10/19 2215  cefTRIAXone (ROCEPHIN) 2 g in sodium chloride 0.9 % 100 mL IVPB  Status:  Discontinued        2 g 200 mL/hr over 30 Minutes Intravenous Every 24 hours 10/10/19 2205 10/12/19 0441   10/10/19 0100  remdesivir 100 mg in sodium chloride 0.9 % 100 mL IVPB       "Followed by" Linked Group Details   100 mg 200 mL/hr over 30 Minutes Intravenous Every 30 min 10/10/19 0050 10/10/19 0332        Susa Raring M.D on 11/05/2019 at 10:11 AM  To page go to [email or web address] -  [facility] -  Office  [phone]   See all Orders from today for further details    Objective:   Vitals:   11/04/19 2330 11/05/19 0412 11/05/19 0500 11/05/19 0738  BP: 109/64 123/75  113/72  Pulse: 100 87  90  Resp: 20 20    Temp: 98.3 F (36.8 C) 99 F (37.2 C)  98.4 F (36.9 C)  TempSrc: Oral Axillary  Oral  SpO2: 93% 96%  92%  Weight:   116.1 kg   Height:        Wt Readings from Last 3 Encounters:  11/05/19 116.1 kg  09/11/19 111.1 kg  05/12/17 (!) 145.2 kg     Intake/Output Summary (Last 24 hours) at 11/05/2019 1011 Last data filed at 11/05/2019 3785 Gross per 24 hour  Intake 254.02 ml  Output 2825 ml  Net -2570.98 ml     Physical Exam  Awake Alert, No new F.N deficits, chronic bilateral rotator cuff injury induced bilateral upper extremity weakness in the deltoid area, per patient this is old and unchanged,  chronic foot wounds on both  feet under bandage [place].AT,PERRAL Supple Neck,No JVD, No cervical lymphadenopathy appriciated.  Symmetrical Chest wall movement, Good air movement bilaterally, CTAB RRR,No Gallops, Rubs or new Murmurs, No Parasternal Heave +ve B.Sounds, Abd Soft, No tenderness, No organomegaly appriciated, No rebound - guarding or rigidity. No Cyanosis     Data Review:    CBC Recent Labs  Lab 10/31/19 0411 11/01/19 0152 11/02/19 0342 11/03/19 0356 11/05/19 0356  WBC 8.3 8.7 8.0 8.6 9.6  HGB 10.1* 9.7* 9.7* 9.6* 10.1*  HCT 30.5* 30.8* 30.5* 30.2* 30.9*  PLT 338 342 322 328 353  MCV 91.9 93.6 93.8 93.8 92.8  MCH 30.4 29.5 29.8 29.8 30.3  MCHC 33.1 31.5 31.8 31.8 32.7  RDW 12.5 12.7 12.9 13.1 13.2  LYMPHSABS 2.3 2.5 2.6 2.6 3.0  MONOABS 0.6 0.5 0.6 0.7 0.8  EOSABS 0.3 0.3 0.4 0.4 0.5  BASOSABS 0.0 0.0 0.0 0.1 0.0    Recent Labs  Lab 10/30/19 0320 10/31/19 0411 10/31/19 0411 11/01/19 0152 11/02/19 0342 11/03/19 0356 11/04/19 1347 11/05/19 0356 11/05/19 0357  NA  --  134*  --  133* 135 136  --  135  --   K  --  3.7  --  3.6 3.8 3.6  --  4.0  --   CL  --  98  --  97* 98 102  --  100  --   CO2  --  27  --  24 25 24   --  25  --   GLUCOSE  --  139*  --  228* 167* 105*  --  155*  --   BUN  --  6  --  6 5* <5*  --  5*  --   CREATININE  --  0.59*  --  0.62 0.74 0.70  --  0.76  --   CALCIUM  --  8.9  --  8.5* 8.7* 8.8*  --  8.9  --   AST  --  18  --  19 17 17   --  25  --   ALT  --  17  --  18 17 17   --  17  --   ALKPHOS  --  85  --  89 92 85  --  84  --   BILITOT  --  0.7  --  0.6 0.7 0.8  --  0.7  --   ALBUMIN  --  2.1*  --  2.0* 2.1* 2.1*  --  2.2*  --   MG  --  1.3*   < > 1.5* 1.8 1.4* 1.8 1.6*  --   CRP  --   --   --   --   --   --   --  9.8*  --   DDIMER  --   --   --   --   --   --   --  5.95*  --   PROCALCITON <0.10  --   --   --   --   --   --  <0.10  --   BNP  --   --   --   --   --   --   --   --  36.0   < > = values in this interval not displayed.     ------------------------------------------------------------------------------------------------------------------ No results for input(s): CHOL, HDL, LDLCALC, TRIG, CHOLHDL, LDLDIRECT in the last 72 hours.  Lab Results  Component Value Date   HGBA1C 11.3 (H) 10/26/2019   ------------------------------------------------------------------------------------------------------------------ No results for input(s): TSH, T4TOTAL, T3FREE, THYROIDAB in the last 72 hours.  Invalid input(s): FREET3  Cardiac Enzymes No results for input(s): CKMB, TROPONINI, MYOGLOBIN in the last 168 hours.  Invalid input(s): CK ------------------------------------------------------------------------------------------------------------------    Component Value Date/Time   BNP 36.0 11/05/2019 0357    Micro Results No results found for this or any previous visit (from the past 240 hour(s)).  Radiology Reports CT HEAD WO CONTRAST  Result Date: 10/19/2019 CLINICAL DATA:  Deliriums.  Mental status change. EXAM: CT HEAD WITHOUT CONTRAST TECHNIQUE: Contiguous axial images were obtained from the base of the skull through the vertex without intravenous contrast. COMPARISON:  None. FINDINGS: Brain: Area subcortical low-density involving the posterior left frontal lobe at the convexity, series 3, image 25 and series 5, image 49, likely encephalomalacia and sequela of remote infarct, however nonspecific. No hemorrhage. No evidence of acute ischemia. No hydrocephalus, midline shift or mass effect. No subdural or extra-axial  collection. Vascular: Atherosclerosis of skullbase vasculature without hyperdense vessel or abnormal calcification. Skull: No fracture or focal lesion. Sinuses/Orbits: Nasogastric tube in place. Fluid levels in the sphenoid sinuses and right maxillary sinus with scattered opacification of ethmoid air cells may be related to intubation. Opacification of the right greater than left mastoid air cells. Other:  None. IMPRESSION: 1. No acute intracranial abnormality. 2. Area of subcortical low-density involving the posterior left frontal lobe at the convexity, likely encephalomalacia and sequela of remote infarct. There are no prior exams for comparison to establish chronicity. MRI could be considered for further evaluation based on clinical concern. 3. Paranasal sinus fluid levels on opacification of the right greater than left mastoid air cells, possibly related to intubation. Electronically Signed   By: Narda Rutherford M.D.   On: 10/19/2019 15:30   CT ANGIO CHEST PE W OR WO CONTRAST  Result Date: 10/23/2019 CLINICAL DATA:  COVID pneumonia, acute hypoxic respiratory failure, positive D-dimer EXAM: CT ANGIOGRAPHY CHEST WITH CONTRAST TECHNIQUE: Multidetector CT imaging of the chest was performed using the standard protocol during bolus administration of intravenous contrast. Multiplanar CT image reconstructions and MIPs were obtained to evaluate the vascular anatomy. CONTRAST:  75mL OMNIPAQUE IOHEXOL 350 MG/ML SOLN COMPARISON:  None. FINDINGS: Cardiovascular: Satisfactory opacification of the pulmonary arteries to the segmental level. No evidence of pulmonary embolism. The central pulmonary arteries are of normal caliber. There is moderate coronary artery calcification. Normal heart size. No pericardial effusion. The thoracic aorta is unremarkable save for bovine arch anatomy. Right upper extremity PICC line tip is seen within the superior vena cava. Mediastinum/Nodes: No enlarged mediastinal, hilar, or axillary lymph nodes. Thyroid gland, trachea, and esophagus demonstrate no significant findings. Nasoenteric feeding tube extends into the upper abdomen beyond the margin of the examination. Lungs/Pleura: Lung volumes are small. Evaluation is limited by motion artifact, however, multifocal pulmonary infiltrates are identified demonstrating a peripheral and basal predominance, likely infectious or inflammatory in the  acute setting. No central obstructing lesion. No pneumothorax or pleural effusion. No superimposed interstitial edema. Upper Abdomen: No acute abnormality. Musculoskeletal: No chest wall abnormality. No acute or significant osseous findings. Review of the MIP images confirms the above findings. IMPRESSION: 1. No evidence of pulmonary embolism. 2. Multifocal pulmonary infiltrates are identified demonstrating a peripheral and basal predominance, likely infectious or inflammatory in the acute setting. 3. Moderate coronary artery calcification. Electronically Signed   By: Helyn Numbers MD   On: 10/23/2019 16:25   MR ANGIO HEAD WO CONTRAST  Result Date: 10/29/2019 CLINICAL DATA:  Neuro deficit with stroke suspected EXAM: MRA HEAD WITHOUT CONTRAST TECHNIQUE: Angiographic images of the Circle of Willis were obtained using MRA technique without intravenous contrast. COMPARISON:  Four days ago FINDINGS: History of bacteremia and embolic infarction. No vessel beading, stenosis, or pseudoaneurysm. IMPRESSION: Negative intracranial MRA. Electronically Signed   By: Marnee Spring M.D.   On: 10/29/2019 12:04   MR BRAIN WO CONTRAST  Result Date: 10/25/2019 CLINICAL DATA:  Neuro deficit, acute, stroke suspected. Additional history provided: COVID positive. Additional history obtained from electronic MEDICAL RECORD NUMBERSepsis with MSSA and group B strep bacteremia. EXAM: MRI HEAD WITHOUT CONTRAST TECHNIQUE: Multiplanar, multiecho pulse sequences of the brain and surrounding structures were obtained without intravenous contrast. COMPARISON:  Head CT 10/19/2019. FINDINGS: Brain: The examination is limited by poor signal on several sequences as well as motion degradation. Most notably, there is moderate motion degradation of the axial T2 weighted sequence, severe motion degradation of the axial SWI sequence  and moderate motion degradation of the coronal T2 weighted sequence. Cerebral volume is normal for age. There is a 17 mm  focus of restricted diffusion and corresponding T2/FLAIR hyperintensity within the subcortical left frontoparietal lobes (for instance as seen on series 2, image 39) (series 3, image 17). Additional mild scattered T2/FLAIR hyperintensity within the cerebral white matter and pons is nonspecific, but consistent with chronic small vessel ischemic disease. Severe motion degradation of the axial SWI sequence precludes adequate evaluation for intracranial blood products. No extra-axial fluid collection. No midline shift. Vascular: Expected proximal arterial flow voids. Skull and upper cervical spine: No focal marrow lesion is identified within described limitations. Sinuses/Orbits: Visualized orbits show no acute finding. Air-fluid levels within the sphenoid and right maxillary sinuses. Partial opacification of right ethmoid air cells. Bilateral mastoid effusions. These results will be called to the ordering clinician or representative by the Radiologist Assistant, and communication documented in the PACS or [employer]. IMPRESSION: Examination limited by poor signal on several sequences as well as motion degradation, as described. 17 mm focus of restricted diffusion and T2/FLAIR hyperintensity within the subcortical left frontoparietal lobes. This finding is nonspecific, but given the patient's history, the primary differential considerations are acute/early subacute infarct versus cerebritis. Consider contrast-enhanced MR imaging of the brain for further evaluation of this lesion. Mild chronic small vessel ischemic disease. Paranasal sinus disease with air-fluid levels. Bilateral mastoid effusions. Electronically Signed   By: Jackey Loge DO   On: 10/25/2019 15:16   MR BRAIN W CONTRAST  Result Date: 10/25/2019 CLINICAL DATA:  Brain mass follow-up EXAM: MRI HEAD WITH CONTRAST TECHNIQUE: Multiplanar, multiecho pulse sequences of the brain and surrounding structures were obtained with intravenous contrast.  CONTRAST:  10mL GADAVIST GADOBUTROL 1 MMOL/ML IV SOLN COMPARISON:  Brain MRI without contrast 10/25/2019 FINDINGS: Brain: There is a peripherally enhancing lesion at the base of the left precentral gyrus that measures 0.9 x 0.7 x 1.8 cm. There are no other areas of abnormal contrast enhancement. 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 Electronically Signed   By: Deatra Robinson M.D.   On: 10/25/2019 22:47   MR FOOT LEFT WO CONTRAST  Result Date: 10/25/2019 CLINICAL DATA:  Bacteremia left foot pain and diabetic EXAM: MRI OF THE LEFT FOOT WITHOUT CONTRAST TECHNIQUE: Multiplanar, multisequence MR imaging of the left was performed. No intravenous contrast was administered. COMPARISON:  October 23, 2019 FINDINGS: Bones/Joint/Cartilage Mildly angulated nondisplaced fractures of the midshaft of the fourth and fifth metatarsals are noted. There is periosteal reaction seen along the medial margins of the metatarsal shafts. Increased heterogeneous T2 hyperintense signal with subtle T1 hypointensity seen at the base of the fourth and fifth metatarsals. No other osseous marrow signal abnormality is seen. There is no large knee joint effusions noted. The articular surfaces appear to be maintained. Ligaments The Lisfranc ligament is intact. Muscles and Tendons Increased feathery signal with atrophy is seen throughout the muscles of the forefoot. There is a small amount of fluid seen surrounding the posterior tibialis tendon. The remainder of the flexor and extensor tendons are intact. Soft tissues Lateral plantar surface of the forefoot overlying the fifth metatarsal base there is a focal area of ulceration measuring approximately 8 mm in transverse dimension. A fluid-filled sinus tract is seen extending to the overlying osseous surface. There is a multilocular fluid collection which extends around the dorsal surface of the  fifth metatarsal measuring approximately 5.4  x 0.9 x 3.4 cm. There is extensive dorsal and lateral subcutaneous edema and skin thickening. IMPRESSION: Superficial area of ulceration over the plantar lateral aspect of the fifth metatarsal base with a fluid-filled sinus tract and loculated probable abscess extending over the dorsal surface of the fifth metatarsal measuring 5.4 x 0.9 by is a 3.4 cm. Incomplete mildly angulated fourth and fifth metatarsal shaft fractures with periosteal reaction Findings which could be suggestive of reactive marrow versus early osteomyelitis involving the base of the fourth and fifth metatarsals. Electronically Signed   By: Jonna Clark M.D.   On: 10/25/2019 19:06   DG Chest Port 1 View  Result Date: 10/26/2019 CLINICAL DATA:  Short of breath.  COVID positive EXAM: PORTABLE CHEST 1 VIEW COMPARISON:  10/25/2019 FINDINGS: Hypoventilation with decreased lung volume. Mild bibasilar airspace disease unchanged. No new infiltrate or effusion. Right arm PICC tip in the SVC unchanged. IMPRESSION: Hypoventilation with bibasilar mild airspace disease unchanged. Electronically Signed   By: Marlan Palau M.D.   On: 10/26/2019 16:10   DG Chest Port 1 View  Result Date: 10/25/2019 CLINICAL DATA:  Shortness of breath. EXAM: PORTABLE CHEST 1 VIEW COMPARISON:  October 16, 2019. FINDINGS: The heart size and mediastinal contours are within normal limits. Hypoinflation of the lungs is noted with mild bibasilar subsegmental atelectasis. Endotracheal tube has been removed. Right-sided PICC line is unchanged in position. The visualized skeletal structures are unremarkable. IMPRESSION: Hypoinflation of the lungs with mild bibasilar subsegmental atelectasis. Electronically Signed   By: Lupita Raider M.D.   On: 10/25/2019 08:37   DG CHEST PORT 1 VIEW  Result Date: 10/16/2019 CLINICAL DATA:  Status post intubation. EXAM: PORTABLE CHEST 1 VIEW COMPARISON:  10/14/2019 FINDINGS: ET tube tip is above the  carina. There is a right arm PICC line with tip at the cavoatrial junction. Lung volumes are low. Similar appearance of bilateral pulmonary opacities compatible with multifocal pneumonia. IMPRESSION: 1. Stable support apparatus. 2. Persistent bilateral pulmonary opacities compatible with multifocal pneumonia. Electronically Signed   By: Signa Kell M.D.   On: 10/16/2019 06:14   DG Chest Port 1 View  Result Date: 10/15/2019 CLINICAL DATA:  49 year old male with history of COVID infection. EXAM: PORTABLE CHEST 1 VIEW COMPARISON:  Chest x-ray 10/14/2019. FINDINGS: An endotracheal tube is in place with tip 3.6 cm above the carina. A nasogastric tube is seen extending into the stomach, however, the tip of the nasogastric tube extends below the lower margin of the image. There is a right upper extremity PICC with tip terminating in the right atrium. Lung volumes are low. Widespread patchy areas of interstitial prominence an ill-defined airspace disease again noted throughout the lungs bilaterally. Overall, aeration is essentially unchanged. No pleural effusions. No evidence of pulmonary edema. No pneumothorax. Heart size is normal. Upper mediastinal contours are within normal limits. IMPRESSION: 1. Support apparatus, as above. 2. The appearance the chest is compatible with multilobar pneumonia from reported COVID infection. Electronically Signed   By: Trudie Reed M.D.   On: 10/15/2019 05:39   DG Chest Port 1 View  Result Date: 10/14/2019 CLINICAL DATA:  COVID-19, ARDS EXAM: PORTABLE CHEST 1 VIEW COMPARISON:  10/13/2019 FINDINGS: Single frontal view of the chest demonstrates stable position of the endotracheal tube and enteric catheter. There is a right-sided PICC tip overlying superior vena cava. The cardiac silhouette is stable. Interstitial and ground-glass opacities are seen throughout the lungs bilaterally, unchanged. Lung volumes are diminished. No effusion or pneumothorax. IMPRESSION: 1. 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 2. Stable support devices. Electronically Signed   By: Sharlet Salina M.D.   On: 10/14/2019 15:57   DG Chest Port 1 View  Result Date: 10/13/2019 CLINICAL DATA:  Acute respiratory failure with hypoxemia, COVID positive, asthma, diabetes mellitus, hypertension, CHF EXAM: PORTABLE CHEST 1 VIEW COMPARISON:  Portable exam 1021 hours compared to 10/12/2019 FINDINGS: Tip of endotracheal tube projects 3.7 cm above carina. Nasogastric tube extends into stomach. RIGHT arm PICC line tip projects over cavoatrial junction. Stable heart size mediastinal contours for technique. Patchy BILATERAL airspace infiltrates consistent with multifocal pneumonia, minimally improved. No pleural effusion or pneumothorax. IMPRESSION: Minimally improved pulmonary infiltrates. Electronically Signed   By: Ulyses Southward M.D.   On: 10/13/2019 10:37   DG CHEST PORT 1 VIEW  Result Date: 10/12/2019 CLINICAL DATA:  Intubation EXAM: PORTABLE CHEST 1 VIEW COMPARISON:  Earlier today FINDINGS: New endotracheal tube with tip at the clavicular heads, measuring 3 cm above the carina. Low volume chest with severe airspace disease. No visible effusion or air leak. Stable heart size which is distorted by positioning and volumes. IMPRESSION: 1. Unremarkable positioning of the endotracheal tube. 2. Stable extensive airspace disease with low lung volumes. Electronically Signed   By: Marnee Spring M.D.   On: 10/12/2019 07:13   DG CHEST PORT 1 VIEW  Result Date: 10/12/2019 CLINICAL DATA:  Shortness of breath EXAM: PORTABLE CHEST 1 VIEW COMPARISON:  Three days ago FINDINGS: Very low volume chest with diffuse and patchy pulmonary opacity. Vascular pedicle widening and prominent heart size largely related to technique. No visible effusion or pneumothorax. IMPRESSION: Worsening multifocal pneumonia.  Lung volumes remain very low.  Electronically Signed   By: Marnee Spring M.D.   On: 10/12/2019 04:07   DG Chest Portable 1 View  Result Date: 10/09/2019 CLINICAL DATA:  Cough, fever, shortness of breath EXAM: PORTABLE CHEST 1 VIEW COMPARISON:  None. FINDINGS: Single frontal view of the chest demonstrates unremarkable cardiac silhouette. The lung volumes are diminished, with patchy bilateral areas of faint ground-glass airspace disease consistent with multifocal pneumonia. No effusion or pneumothorax. IMPRESSION: 1. Patchy bilateral ground-glass airspace disease consistent with multifocal pneumonia. Electronically Signed   By: Sharlet Salina M.D.   On: 10/09/2019 19:32   DG Abd Portable 1V  Result Date: 10/20/2019 CLINICAL DATA:  GI problem. EXAM: PORTABLE ABDOMEN - 1 VIEW COMPARISON:  10/16/2019 FINDINGS: Feeding tube tip is in the distal stomach or proximal duodenum. Mild gaseous distention of the stomach. Gas throughout nondistended large and small bowel. No evidence of bowel obstruction, organomegaly or free air. IMPRESSION: Feeding tube tip in the distal stomach or proximal duodenum. No evidence of obstruction or free air. Electronically Signed   By: Charlett Nose M.D.   On: 10/20/2019 21:28   DG Abd Portable 1V  Result Date: 10/16/2019 CLINICAL DATA:  Status post OG tube placement EXAM: PORTABLE ABDOMEN - 1 VIEW COMPARISON:  10/16/2019 FINDINGS: The enteric tube tip projects over the distal body of stomach in the side port is below the level of the GE junction. A few air-filled loops of small bowel are noted within the upper abdomen which measure up to 2.8 cm. IMPRESSION: Satisfactory position of enteric tube. Electronically Signed   By: Signa Kell M.D.   On: 10/16/2019 06:52   DG Abd Portable 1V  Result Date: 10/12/2019 CLINICAL DATA:  Onychogryphosis EXAM: PORTABLE ABDOMEN - 1 VIEW COMPARISON:  Portable exam 0830 hours without priors for comparison FINDINGS: Tip of nasogastric tube projects over distal antrum.  Nonobstructive bowel gas pattern. No bowel dilatation or bowel wall thickening. Patchy bibasilar infiltrates. No acute osseous findings. IMPRESSION: Tip of nasogastric tube projects over distal gastric antrum. Nonobstructive bowel gas pattern. Bibasilar pulmonary infiltrates. Electronically Signed   By: Ulyses Southward M.D.   On: 10/12/2019 08:51   DG Foot 2 Views Left  Result Date: 10/23/2019 CLINICAL DATA:  Bacteremia, LEFT foot pain and diabetic. EXAM: LEFT FOOT - 2 VIEW COMPARISON:  10/13/2019 and prior radiographs FINDINGS: Mildly angulated fractures of the 4th and 5th metatarsals are again noted with fracture lines still evident. Healing changes along these fractures are noted. Overlying soft tissue swelling is noted. No new fracture, subluxation or dislocation noted. No definite radiographic evidence of acute osteomyelitis noted. IMPRESSION: Mildly angulated healing fractures of the 4th and 5th metatarsals with soft tissue swelling again noted but without definite radiographic evidence of acute osteomyelitis. Consider MRI for further evaluation as clinically indicated. Electronically Signed   By: Harmon Pier M.D.   On: 10/23/2019 13:40   DG Foot 2 Views Left  Result Date: 10/13/2019 CLINICAL DATA:  49 year old male with diabetic foot. EXAM: LEFT FOOT - 2 VIEW COMPARISON:  Left foot radiograph dated 09/11/2019. FINDINGS: Evaluation is limited due to positioning. Fractures of the midportion of the fourth and fifth metacarpal with interval progression of bridging callus formation. No new fracture identified. There is no dislocation. There is diffuse soft tissue swelling of the foot. No radiopaque foreign object or soft tissue gas. IMPRESSION: 1. Healing fractures of the fourth and fifth metacarpal. 2. Diffuse soft tissue swelling. No radiopaque foreign object or soft tissue gas. Electronically Signed   By: Elgie Collard M.D.   On: 10/13/2019 16:49   EEG adult  Result Date: 10/27/2019 Charlsie Quest,  MD     10/28/2019 10:13 AM Patient Name: VISHWA DAIS MRN: [id number] Epilepsy Attending: Charlsie Quest Referring Physician/Provider: Dr. [name] Date: 10/27/2019 Duration: 22.22 minutes Patient history: 49 year old male with altered mental status. EEG evaluate for seizures. Level of alertness: Awake, asleep AEDs during EEG study: Keppra Technical aspects: This EEG study was done with scalp electrodes positioned according to the 10-20 International system of electrode placement. Electrical activity was acquired at a sampling rate of 500Hz  and reviewed with a high frequency filter of 70Hz  and a low frequency filter of 1Hz . EEG data were recorded continuously and digitally stored. Description: The posterior dominant rhythm consists of 8 Hz activity of moderate voltage (25-35 uV) seen predominantly in posterior head regions, symmetric and reactive to eye opening and eye closing. Sleep was characterized by vertex waves, sleep spindles (12 to 14 Hz), maximal frontocentral region. Frequent runs of sharp waves were seen in frontocentral region, maximal vertex lasting about 5 to 6 seconds consistent with brief ictal-interictal rhythmic discharges. Continuous generalized 5 to 6 Hz theta slowing was also noted. Hyperventilation and photic stimulation were not performed.   ABNORMALITY -Continuous slow, generalized -Brief ictal-interictal rhythmic discharges, bilateral frontocentral region, maximal vertex IMPRESSION: This study showed evidence of potential epileptogenicity arising from bilateral frontocentral, maximal vertex region. The sharp waves are at times rhythmic consistent with brief ictal-interictal rhythmic discharges. Additionally, there is evidence of mild diffuse encephalopathy, nonspecific etiology. Priyanka [name]   Overnight EEG with video  Result Date: 10/28/2019 Charlsie Quest, MD     10/29/2019  8:27 AM Patient Name: ADOLF ORMISTON MRN: [id number]  Epilepsy Attending: Charlsie Quest Referring  Physician/Provider: Dr. [name] Duration: 10/27/2019 1145 to 10/28/2019 1145  Patient history: 49 year old male with altered mental status. EEG evaluate for seizures.  Level of alertness: Awake, asleep AEDs during EEG study: Keppra  Technical aspects: This EEG study was done with scalp electrodes positioned according to the 10-20 International system of electrode placement. Electrical activity was acquired at a sampling rate of 500Hz  and reviewed with a high frequency filter of 70Hz  and a low frequency filter of 1Hz . EEG data were recorded continuously and digitally stored.  Description: The posterior dominant rhythm consists of 8 Hz activity of moderate voltage (25-35 uV) seen predominantly in posterior head regions, symmetric and reactive to eye opening and eye closing. Sleep was characterized by vertex waves, sleep spindles (12 to 14 Hz), maximal frontocentral region. Initially, frequent runs of sharp waves were seen in frontocentral region, maximal vertex lasting about 5 to 6 seconds consistent with brief ictal-interictal rhythmic discharges. Continuous generalized 5 to 6 Hz theta slowing as well as intermittent generalized rhythmic 2-3hz  delta slowing  was also noted. Hyperventilation and photic stimulation were not performed.    ABNORMALITY -Continuous slow, generalized - Intermittent rhythmic delta slowing, generalized -Brief ictal-interictal rhythmic discharges, bilateral frontocentral region, maximal vertex  IMPRESSION: This study initially showed evidence of potential epileptogenicity arising from bilateral frontocentral, maximal vertex region. The sharp waves are at times rhythmic consistent with brief ictal-interictal rhythmic discharges. Additionally, there is evidence of mild diffuse encephalopathy, nonspecific etiology. No seizures were seen during thi study. EEG appears to be improving compared to previous day.  Charlsie Quest   ECHOCARDIOGRAM COMPLETE  Result Date: 10/12/2019     ECHOCARDIOGRAM REPORT   Patient Name:   GARIN KOPERSKI Date of Exam: 10/12/2019 Medical Rec #:  [id number]      Height:       74.0 in Accession #:    [id number]     Weight:       245.0 lb Date of Birth:  [birth year]      BSA:          2.369 m Patient Age:    49 years       BP:           111/74 mmHg Patient Gender: M              HR:           75 bpm. Exam Location:  Inpatient Procedure: 2D Echo Indications:    CHF 428                  & Bacteremia 790.7  History:        Patient has no prior history of Echocardiogram examinations.                 CHF; Risk Factors:Hypertension and Diabetes. Covid +.  Sonographer:    Celene Skeen RDCS (AE) Referring Phys: [id number] Clarene Critchley [facility]  Sonographer Comments: Echo performed with patient supine and on artificial respirator. Image acquisition challenging due to respiratory motion and Image acquisition challenging due to patient body habitus. restricted mobility IMPRESSIONS  1. Extremely poor acoustic windows. LVEF appears to be depressed with diffuse hypokinesis, worse in the mid/distal inferior/inferoseptal walls. . Left ventricular ejection fraction, by estimation, is 30 to 35%. The left ventricle has moderately decreased function. The left ventricular internal cavity size was mildly dilated. Left ventricular diastolic parameters are indeterminate.  2. Right ventricular systolic function is moderately reduced.  The right ventricular size is mildly enlarged.  3. The mitral valve is normal in structure. Trivial mitral valve regurgitation.  4. The aortic valve is normal in structure. Aortic valve regurgitation is not visualized. FINDINGS  Left Ventricle: Extremely poor acoustic windows. LVEF appears to be depressed with diffuse hypokinesis, worse in the mid/distal inferior/inferoseptal walls. Left ventricular ejection fraction, by estimation, is 30 to 35%. The left ventricle has moderately decreased function. The left ventricle demonstrates regional wall motion abnormalities. The left  ventricular internal cavity size was mildly dilated. There is no left ventricular hypertrophy. Left ventricular diastolic parameters are indeterminate. Right Ventricle: The right ventricular size is mildly enlarged. Right vetricular wall thickness was not assessed. Right ventricular systolic function is moderately reduced. Left Atrium: Left atrial size was normal in size. Right Atrium: Right atrial size was normal in size. Pericardium: There is no evidence of pericardial effusion. Mitral Valve: The mitral valve is normal in structure. Trivial mitral valve regurgitation. Tricuspid Valve: The tricuspid valve is normal in structure. Tricuspid valve regurgitation is trivial. Aortic Valve: The aortic valve is normal in structure. Aortic valve regurgitation is not visualized. Pulmonic Valve: The pulmonic valve was grossly normal. Pulmonic valve regurgitation is trivial. Aorta: The aortic root is normal in size and structure. IAS/Shunts: The interatrial septum was not assessed.  LEFT VENTRICLE PLAX 2D LVIDd:         5.60 cm  Diastology LVIDs:         3.90 cm  LV e' lateral:   8.81 cm/s LV PW:         1.40 cm  LV E/e' lateral: 5.7 LV IVS:        1.10 cm  LV e' medial:    5.87 cm/s LVOT diam:     2.40 cm  LV E/e' medial:  8.5 LV SV:         64 LV SV Index:   27 LVOT Area:     4.52 cm  RIGHT VENTRICLE RV S prime:     9.79 cm/s TAPSE (M-mode): 1.3 cm LEFT ATRIUM             Index       RIGHT ATRIUM           Index LA diam:        3.10 cm 1.31 cm/m  RA Area:     18.80 cm LA Vol (A2C):   62.9 ml 26.55 ml/m RA Volume:   51.80 ml  21.86 ml/m LA Vol (A4C):   40.8 ml 17.22 ml/m LA Biplane Vol: 50.8 ml 21.44 ml/m  AORTIC VALVE LVOT Vmax:   69.80 cm/s LVOT Vmean:  49.400 cm/s LVOT VTI:    0.142 m  AORTA Ao Root diam: 3.60 cm MITRAL VALVE MV Area (PHT): 2.34 cm    SHUNTS MV Decel Time: 324 msec    Systemic VTI:  0.14 m MV E velocity: 50.10 cm/s  Systemic Diam: 2.40 cm MV A velocity: 71.60 cm/s MV E/A ratio:  0.70 Dietrich Pates MD  Electronically signed by Dietrich Pates MD Signature Date/Time: 10/12/2019/1:58:41 PM    Final    ECHO TEE  Result Date: 10/24/2019    TRANSESOPHOGEAL ECHO REPORT   Patient Name:   [name] Date of Exam: 10/24/2019 Medical Rec #:  [id number]      Height:       74.0 in Accession #:    [id number]     Weight:       247.4 lb  Date of Birth:  08-18-1970      BSA:          2.379 m Patient Age:    49 years       BP:           120/80 mmHg Patient Gender: M              HR:           100 bpm. Exam Location:  Inpatient Procedure: Transesophageal Echo Indications:    Emobli/Bacteremia  History:        Patient has prior history of Echocardiogram examinations.  Sonographer:    Ross Ludwig RDCS (AE) Referring Phys: [phone] HAO MENG PROCEDURE: The transesophogeal probe was passed without difficulty through the esophogus of the patient. Sedation performed by different physician. The patient's vital signs; including heart rate, blood pressure, and oxygen saturation; remained stable throughout the procedure. The patient developed no complications during the procedure. IMPRESSIONS  1. Left ventricular ejection fraction, by estimation, is 50 to 55%. The left ventricle has low normal function. The left ventricle has no regional wall motion abnormalities.  2. Right ventricular systolic function is normal. The right ventricular size is normal.  3. No left atrial/left atrial appendage thrombus was detected.  4. The mitral valve is normal in structure. Trivial mitral valve regurgitation. No evidence of mitral stenosis.  5. The aortic valve is normal in structure. Aortic valve regurgitation is not visualized. No aortic stenosis is present.  6. The inferior vena cava is normal in size with greater than 50% respiratory variability, suggesting right atrial pressure of 3 mmHg. Conclusion(s)/Recommendation(s): Normal biventricular function without evidence of hemodynamically significant valvular heart disease. FINDINGS  Left Ventricle: Left  ventricular ejection fraction, by estimation, is 50 to 55%. The left ventricle has low normal function. The left ventricle has no regional wall motion abnormalities. The left ventricular internal cavity size was normal in size. There is no left ventricular hypertrophy. Right Ventricle: The right ventricular size is normal. No increase in right ventricular wall thickness. Right ventricular systolic function is normal. Left Atrium: Left atrial size was normal in size. No left atrial/left atrial appendage thrombus was detected. Right Atrium: Right atrial size was normal in size. Pericardium: There is no evidence of pericardial effusion. Mitral Valve: The mitral valve is normal in structure. Normal mobility of the mitral valve leaflets. Trivial mitral valve regurgitation. No evidence of mitral valve stenosis. There is no evidence of mitral valve vegetation. Tricuspid Valve: The tricuspid valve is normal in structure. Tricuspid valve regurgitation is mild . No evidence of tricuspid stenosis. There is no evidence of tricuspid valve vegetation. Aortic Valve: The aortic valve is normal in structure. Aortic valve regurgitation is not visualized. No aortic stenosis is present. There is no evidence of aortic valve vegetation. Pulmonic Valve: The pulmonic valve was normal in structure. Pulmonic valve regurgitation is trivial. No evidence of pulmonic stenosis. Aorta: The aortic root is normal in size and structure. Venous: The inferior vena cava is normal in size with greater than 50% respiratory variability, suggesting right atrial pressure of 3 mmHg. IAS/Shunts: No atrial level shunt detected by color flow Doppler. [name] MD Electronically signed by [name] MD Signature Date/Time: 10/24/2019/3:28:45 PM    Final    VAS US CAROTID (at [facility] and [facility] only)  Result Date: 10/26/2019 Carotid Arterial Duplex Study Indications:       17mm focus of diffusion restriction in the left  frontoparietal lobe  concerning for acute/early subacute                    infarct vs cerebritis. Risk Factors:      Hypertension, Diabetes. Other Factors:     Covid-19, bacteremia. Comparison Study:  No prior study on file Performing Technologist: Sherren Kerns RVS  Examination Guidelines: A complete evaluation includes B-mode imaging, spectral Doppler, color Doppler, and power Doppler as needed of all accessible portions of each vessel. Bilateral testing is considered an integral part of a complete examination. Limited examinations for reoccurring indications may be performed as noted.  Right Carotid Findings: +----------+--------+--------+--------+------------------+--------+           PSV cm/sEDV cm/sStenosisPlaque DescriptionComments +----------+--------+--------+--------+------------------+--------+ CCA Prox  66      11                                         +----------+--------+--------+--------+------------------+--------+ CCA Distal71      18                                         +----------+--------+--------+--------+------------------+--------+ ICA Prox  58      19                                         +----------+--------+--------+--------+------------------+--------+ ICA Distal90      33                                         +----------+--------+--------+--------+------------------+--------+ ECA       114     16                                         +----------+--------+--------+--------+------------------+--------+ +----------+--------+-------+--------+-------------------+           PSV cm/sEDV cmsDescribeArm Pressure (mmHG) +----------+--------+-------+--------+-------------------+ [id number]                                        +----------+--------+-------+--------+-------------------+ +---------+--------+--+--------+--+ VertebralPSV cm/s38EDV cm/s11 +---------+--------+--+--------+--+  Left Carotid Findings:  +----------+--------+--------+--------+------------------+--------+           PSV cm/sEDV cm/sStenosisPlaque DescriptionComments +----------+--------+--------+--------+------------------+--------+ CCA Prox  55      14                                         +----------+--------+--------+--------+------------------+--------+ CCA Distal65      20                                         +----------+--------+--------+--------+------------------+--------+ ICA Prox  68      20                                         +----------+--------+--------+--------+------------------+--------+  ICA Distal76      23                                         +----------+--------+--------+--------+------------------+--------+ ECA       77      3                                          +----------+--------+--------+--------+------------------+--------+ +----------+--------+--------+--------+-------------------+           PSV cm/sEDV cm/sDescribeArm Pressure (mmHG) +----------+--------+--------+--------+-------------------+ [id number]                                          +----------+--------+--------+--------+-------------------+ +---------+--------+--+--------+--+ VertebralPSV cm/s63EDV cm/s18 +---------+--------+--+--------+--+   Summary: Right Carotid: The extracranial vessels were near-normal with only minimal wall                thickening or plaque. Left Carotid: The extracranial vessels were near-normal with only minimal wall               thickening or plaque. Vertebrals:  Bilateral vertebral arteries demonstrate antegrade flow. Subclavians: Normal flow hemodynamics were seen in bilateral subclavian              arteries. *See table(s) above for measurements and observations.  Electronically signed by Delia Heady MD on 10/26/2019 at 12:37:48 PM.    Final    VAS [place] LOWER EXTREMITY ARTERIAL DUPLEX  Result Date: 10/26/2019 LOWER EXTREMITY ARTERIAL DUPLEX STUDY  Indications: Ulceration, and Osteomyelitis. High Risk Factors: Hypertension, Diabetes. Other Factors: Covid-19. Charcot foot.  Current ABI: N/A Comparison Study: No prior study Performing Technologist: Sherren Kerns RVS  Examination Guidelines: A complete evaluation includes B-mode imaging, spectral Doppler, color Doppler, and power Doppler as needed of all accessible portions of each vessel. Bilateral testing is considered an integral part of a complete examination. Limited examinations for reoccurring indications may be performed as noted.  +----------+--------+-----+--------+---------+--------+ LEFT      PSV cm/sRatioStenosisWaveform Comments +----------+--------+-----+--------+---------+--------+ CFA Prox  123                  triphasic         +----------+--------+-----+--------+---------+--------+ DFA       51                   triphasic         +----------+--------+-----+--------+---------+--------+ SFA Prox  97                   triphasic         +----------+--------+-----+--------+---------+--------+ SFA Mid   88                   triphasic         +----------+--------+-----+--------+---------+--------+ SFA Distal81                   triphasic         +----------+--------+-----+--------+---------+--------+ POP Prox  60                   triphasic         +----------+--------+-----+--------+---------+--------+ POP Distal86  triphasic         +----------+--------+-----+--------+---------+--------+ ATA Distal110                  triphasic         +----------+--------+-----+--------+---------+--------+ PTA Prox  34                   triphasic         +----------+--------+-----+--------+---------+--------+ PTA Mid   29                   triphasic         +----------+--------+-----+--------+---------+--------+ PTA [id number]                  triphasic         +----------+--------+-----+--------+---------+--------+   Summary: Left: Normal waveforms and adequate flow noted.  See table(s) above for measurements and observations. Electronically signed by Lemar Livings MD on 10/26/2019 at 4:50:14 PM.    Final    VAS [place] LOWER EXTREMITY ARTERIAL DUPLEX  Result Date: 10/19/2019 LOWER EXTREMITY ARTERIAL DUPLEX STUDY Indications: Cold leg, Covid-19.  Current ABI: N/A Comparison Study: No prior study Performing Technologist: Sherren Kerns RVS  Examination Guidelines: A complete evaluation includes B-mode imaging, spectral Doppler, color Doppler, and power Doppler as needed of all accessible portions of each vessel. Bilateral testing is considered an integral part of a complete examination. Limited examinations for reoccurring indications may be performed as noted.  +-----------+--------+-----+--------+---------+--------+ RIGHT      PSV cm/sRatioStenosisWaveform Comments +-----------+--------+-----+--------+---------+--------+ CFA Prox   67                   triphasic         +-----------+--------+-----+--------+---------+--------+ DFA        60                   triphasic         +-----------+--------+-----+--------+---------+--------+ SFA Prox   68                   triphasic         +-----------+--------+-----+--------+---------+--------+ SFA Mid    62                   triphasic         +-----------+--------+-----+--------+---------+--------+ SFA Distal 69                   triphasic         +-----------+--------+-----+--------+---------+--------+ POP Prox   47                   triphasic         +-----------+--------+-----+--------+---------+--------+ POP Distal 42                   triphasic         +-----------+--------+-----+--------+---------+--------+ ATA Distal 15                   triphasic         +-----------+--------+-----+--------+---------+--------+ PTA Prox   31                   triphasic          +-----------+--------+-----+--------+---------+--------+ PTA Mid    38                   triphasic         +-----------+--------+-----+--------+---------+--------+ PTA Distal 38  triphasic         +-----------+--------+-----+--------+---------+--------+ PERO Distal36                   triphasic         +-----------+--------+-----+--------+---------+--------+  Summary: Right: Normal examination. No evidence of arterial occlusive disease. Normal waveforms, no stenosis.  See table(s) above for measurements and observations. Electronically signed by Sherald Hess MD on 10/19/2019 at 3:25:02 PM.    Final    VAS [place] LOWER EXTREMITY VENOUS (DVT)  Result Date: 10/26/2019  Lower Venous DVTStudy Indications: Covid-19, elevated D-Dimer.  Comparison Study: Prior negative left lower extremity venous duplex don 03/19/17                   and is available for comparison. Performing Technologist: Sherren Kerns RVS  Examination Guidelines: A complete evaluation includes B-mode imaging, spectral Doppler, color Doppler, and power Doppler as needed of all accessible portions of each vessel. Bilateral testing is considered an integral part of a complete examination. Limited examinations for reoccurring indications may be performed as noted. The reflux portion of the exam is performed with the patient in reverse Trendelenburg.  +---------+---------------+---------+-----------+----------+--------------+ RIGHT    CompressibilityPhasicitySpontaneityPropertiesThrombus Aging +---------+---------------+---------+-----------+----------+--------------+ CFV      Full           Yes      Yes                                 +---------+---------------+---------+-----------+----------+--------------+ SFJ      Full                                                        +---------+---------------+---------+-----------+----------+--------------+ FV Prox  Full                                                         +---------+---------------+---------+-----------+----------+--------------+ FV Mid   Full                                                        +---------+---------------+---------+-----------+----------+--------------+ FV DistalFull                                                        +---------+---------------+---------+-----------+----------+--------------+ PFV      Full                                                        +---------+---------------+---------+-----------+----------+--------------+ POP      Partial        Yes      Yes  Acute          +---------+---------------+---------+-----------+----------+--------------+ PTV      Full                                                        +---------+---------------+---------+-----------+----------+--------------+ PERO     Full                                                        +---------+---------------+---------+-----------+----------+--------------+   +---------+---------------+---------+-----------+----------+--------------+ LEFT     CompressibilityPhasicitySpontaneityPropertiesThrombus Aging +---------+---------------+---------+-----------+----------+--------------+ CFV      Full           Yes      Yes                                 +---------+---------------+---------+-----------+----------+--------------+ SFJ      Full                                                        +---------+---------------+---------+-----------+----------+--------------+ FV Prox  Full                                                        +---------+---------------+---------+-----------+----------+--------------+ FV Mid   Full                                                        +---------+---------------+---------+-----------+----------+--------------+ FV DistalFull                                                         +---------+---------------+---------+-----------+----------+--------------+ PFV      Full                                                        +---------+---------------+---------+-----------+----------+--------------+ POP      Full           Yes      Yes                                 +---------+---------------+---------+-----------+----------+--------------+ PTV      Full                                                        +---------+---------------+---------+-----------+----------+--------------+  PERO     Full                                                        +---------+---------------+---------+-----------+----------+--------------+     Summary: RIGHT: - Findings consistent with acute deep vein thrombosis involving the right popliteal vein. - Ultrasound characteristics of enlarged lymph nodes are noted in the groin.  LEFT: - There is no evidence of deep vein thrombosis in the lower extremity.  *See table(s) above for measurements and observations. Electronically signed by Lemar Livings MD on 10/26/2019 at 4:50:27 PM.    Final    [place] EKG SITE RITE  Result Date: 10/12/2019 If Site Rite image not attached, placement could not be confirmed due to current cardiac rhythm.

## 2019-11-05 NOTE — Progress Notes (Signed)
Inpatient Rehab Admissions Coordinator:  Spoke with pt via phone. Updated on bed availability for today.  Pt continues to want to pursue CIR. Will continue to follow.   Wolfgang Phoenix, MS, CCC-SLP Admissions Coordinator [phone]

## 2019-11-06 LAB — CBC WITH DIFFERENTIAL/PLATELET
Abs Immature Granulocytes: 0.09 10*3/uL — ABNORMAL HIGH (ref 0.00–0.07)
Basophils Absolute: 0 10*3/uL (ref 0.0–0.1)
Basophils Relative: 0 %
Eosinophils Absolute: 0.5 10*3/uL (ref 0.0–0.5)
Eosinophils Relative: 5 %
HCT: 32.3 % — ABNORMAL LOW (ref 39.0–52.0)
Hemoglobin: 10.2 g/dL — ABNORMAL LOW (ref 13.0–17.0)
Immature Granulocytes: 1 %
Lymphocytes Relative: 32 %
Lymphs Abs: 3.1 10*3/uL (ref 0.7–4.0)
MCH: 29.8 pg (ref 26.0–34.0)
MCHC: 31.6 g/dL (ref 30.0–36.0)
MCV: 94.4 fL (ref 80.0–100.0)
Monocytes Absolute: 0.7 10*3/uL (ref 0.1–1.0)
Monocytes Relative: 8 %
Neutro Abs: 5.3 10*3/uL (ref 1.7–7.7)
Neutrophils Relative %: 54 %
Platelets: 354 10*3/uL (ref 150–400)
RBC: 3.42 MIL/uL — ABNORMAL LOW (ref 4.22–5.81)
RDW: 13.4 % (ref 11.5–15.5)
WBC: 9.7 10*3/uL (ref 4.0–10.5)
nRBC: 0 % (ref 0.0–0.2)

## 2019-11-06 LAB — COMPREHENSIVE METABOLIC PANEL
ALT: 18 U/L (ref 0–44)
AST: 21 U/L (ref 15–41)
Albumin: 2.3 g/dL — ABNORMAL LOW (ref 3.5–5.0)
Alkaline Phosphatase: 89 U/L (ref 38–126)
Anion gap: 10 (ref 5–15)
BUN: <5 mg/dL — ABNORMAL LOW (ref 6–20)
CO2: 26 mmol/L (ref 22–32)
Calcium: 8.9 mg/dL (ref 8.9–10.3)
Chloride: 98 mmol/L (ref 98–111)
Creatinine, Ser: 0.78 mg/dL (ref 0.61–1.24)
GFR calc Af Amer: >60 mL/min (ref >60)
GFR calc non Af Amer: >60 mL/min (ref >60)
Glucose, Bld: 163 mg/dL — ABNORMAL HIGH (ref 70–99)
Potassium: 4 mmol/L (ref 3.5–5.1)
Sodium: 134 mmol/L — ABNORMAL LOW (ref 135–145)
Total Bilirubin: 0.7 mg/dL (ref 0.3–1.2)
Total Protein: 7.4 g/dL (ref 6.5–8.1)

## 2019-11-06 LAB — C-REACTIVE PROTEIN: CRP: 11.4 mg/dL — ABNORMAL HIGH (ref <1.0)

## 2019-11-06 LAB — GLUCOSE, CAPILLARY
Glucose-Capillary: 166 mg/dL — ABNORMAL HIGH (ref 70–99)
Glucose-Capillary: 140 mg/dL — ABNORMAL HIGH (ref 70–99)
Glucose-Capillary: 160 mg/dL — ABNORMAL HIGH (ref 70–99)
Glucose-Capillary: 148 mg/dL — ABNORMAL HIGH (ref 70–99)
Glucose-Capillary: 191 mg/dL — ABNORMAL HIGH (ref 70–99)

## 2019-11-06 LAB — MAGNESIUM: Magnesium: 1.8 mg/dL (ref 1.7–2.4)

## 2019-11-06 MED ORDER — MAGNESIUM SULFATE 2 GM/50ML IV SOLN
2.0000 g | Freq: Once | INTRAVENOUS | Status: AC
  Administered 2019-11-06: 2 g via INTRAVENOUS
  Filled 2019-11-06: qty 50

## 2019-11-06 MED ORDER — APIXABAN 5 MG PO TABS
5.0000 mg | ORAL_TABLET | Freq: Two times a day (BID) | ORAL | Status: DC
  Administered 2019-11-06 – 2019-11-15 (×18): 5 mg via ORAL
  Filled 2019-11-06 (×18): qty 1

## 2019-11-06 NOTE — TOC Progression Note (Addendum)
Transition of Care [facility]) - Progression Note    Patient Details  Name: Micheal Bell MRN: [id number] Date of Birth: Jun 14, 1970  [facility]) CM/SW Contact  Mearl Latin, LCSW Phone Number: 11/06/2019, 12:00 PM  Clinical Narrative:    CSW continuing to follow patient for discharge needs. According to Financial Counseling (9/13), patient has [facility], which does not provide SNF benefits. If CIR is unable to accept patient, he would require an LOG SNF bed which may be difficult to find if he does not qualify for Disability. Will follow up with CIR to confirm benefits.   Update: Per CIR, the letter they received from family indicates that patient did receive [place] [employer]/Disability, though the Medicaid system was not able to confirm that. Lauren reported that she will call the number on the letter to check status.   Expected Discharge Plan: Skilled Nursing Facility Barriers to Discharge: Continued Medical Work up  Expected Discharge Plan and Services Expected Discharge Plan: Skilled Nursing Facility In-house Referral: Clinical Social Work Discharge Planning Services: CM Consult                                           Social Determinants of Health (SDOH) Interventions    Readmission Risk Interventions No flowsheet data found.

## 2019-11-06 NOTE — Plan of Care (Signed)
  Problem: Education: Goal: Knowledge of risk factors and measures for prevention of condition will improve Outcome: Progressing   Problem: Coping: Goal: Psychosocial and spiritual needs will be supported Outcome: Progressing   Problem: Respiratory: Goal: Will maintain a patent airway Outcome: Progressing   Problem: Education: Goal: Knowledge of General Education information will improve Description: Including pain rating scale, medication(s)/side effects and non-pharmacologic comfort measures Outcome: Progressing   Problem: Clinical Measurements: Goal: Respiratory complications will improve Outcome: Progressing   Problem: Activity: Goal: Risk for activity intolerance will decrease Outcome: Progressing   Problem: Coping: Goal: Level of anxiety will decrease Outcome: Progressing   Problem: Pain Managment: Goal: General experience of comfort will improve Outcome: Progressing   Problem: Safety: Goal: Ability to remain free from injury will improve Outcome: Progressing   Problem: Skin Integrity: Goal: Risk for impaired skin integrity will decrease Outcome: Progressing   Problem: Education: Goal: Knowledge of disease or condition will improve Outcome: Progressing Goal: Knowledge of secondary prevention will improve Outcome: Progressing Goal: Knowledge of patient specific risk factors addressed and post discharge goals established will improve Outcome: Progressing Goal: Individualized Educational Video(s) Outcome: Progressing   Problem: Coping: Goal: Will verbalize positive feelings about self Outcome: Progressing Goal: Will identify appropriate support needs Outcome: Progressing   Problem: Health Behavior/Discharge Planning: Goal: Ability to manage health-related needs will improve Outcome: Progressing   Problem: Self-Care: Goal: Ability to participate in self-care as condition permits will improve Outcome: Progressing Goal: Verbalization of feelings and  concerns over difficulty with self-care will improve Outcome: Progressing Goal: Ability to communicate needs accurately will improve Outcome: Progressing   Problem: Nutrition: Goal: Risk of aspiration will decrease Outcome: Progressing Goal: Dietary intake will improve Outcome: Progressing   Problem: Ischemic Stroke/TIA Tissue Perfusion: Goal: Complications of ischemic stroke/TIA will be minimized Outcome: Progressing   

## 2019-11-06 NOTE — Progress Notes (Signed)
ANTICOAGULATION CONSULT NOTE - Initial Consult  Pharmacy Consult for apixaban Indication: 9/2 RLE DVT  Allergies  Allergen Reactions  . Azithromycin   . Lodine [Etodolac] Anaphylaxis    Patient Measurements: Height: 6\' 2"  (188 cm) Weight: 116.1 kg (255 lb 15.3 oz) IBW/kg (Calculated) : 82.2  Vital Signs: Temp: 97.9 F (36.6 C) (09/13 0334) Temp Source: Oral (09/13 0334) BP: 103/90 (09/13 0334) Pulse Rate: 83 (09/13 0334)  Labs: Recent Labs    11/05/19 0356 11/06/19 0339  HGB 10.1* 10.2*  HCT 30.9* 32.3*  PLT 353 354  CREATININE 0.76 0.78    Estimated Creatinine Clearance: 148 mL/min (by C-G formula based on SCr of 0.78 mg/dL).     Assessment: 49 yo with acute RLE DVT on 10/26/19 in the setting of COVID. Patient has been on treatment dose enoxaparin 9/3 to 9/13. Discussed with MD who agreed with switch to apixaban. Given that patient has received 10 days of full enoxaparin, will start on apixaban 5mg  BID.   Goal of Therapy:  Monitor platelets by anticoagulation protocol: Yes   Plan:  Apixaban 5mg  BID  Will work with TOC for access to apixaban after discharge   10/13, PharmD, BCPS, [facility] Clinical Pharmacist  Please check AMION for all [facility] Pharmacy phone numbers After 10:00 PM, call Main Pharmacy [phone]

## 2019-11-06 NOTE — Progress Notes (Signed)
PROGRESS NOTE                                                                                                                                                                                                             Patient Demographics:    Micheal Bell, is a 49 y.o. male, DOB - [birth year], EAV:[id number]  Outpatient Primary MD for the patient is [name], MD    LOS - 25  Admit date - 10/09/2019    Chief Complaint  Patient presents with  . Cough       Brief Narrative - 49 yo male with hx of HTN, T2DM, gout, IBS, fibromyalgia, obesity who presented on 8/16 for COVID pneumonia.  He decompensated on 8/19 and was intubated.  Self extubated on 8/23 and was reintubated.  He's subsequently been extubated on 8/28.  He's been on typical therapies for covid including steroids, baricitinib and remdesivir.  His hospitalization was complicated by MSSA and GBS bacteria.  Also found to have systolic heart failure.    Subjective:   Patient in bed, appears comfortable, denies any headache, no fever, no chest pain or pressure, no shortness of breath , no abdominal pain. No focal weakness.   Assessment  & Plan :   Acute Hypoxic Resp. Failure due to Acute Covid 19 Viral Pneumonitis during the ongoing 2020 Covid 19 Pandemic - he had severe hypoxia and parenchymal lung injury he was intubated and admitted by ICU, he was started on IV steroids, remdesivir and Baricitinib on 10/12/2019.  He was subsequently extubated on 10/21/2019.  His stay was complicated by bacteremia after which Baricitinib was discontinued on 10/24/2019.  His pulmonary status is gradually improving, currently he is stable and symptom-free, he is on room air, I have discussed with him about incentive spirometry, flutter valve, pulmonary toiletry, he is now off of isolation and his pulmonary issues from COVID-19 is clinically resolved.   Recent Labs  Lab 11/01/19 0152  11/02/19 0342 11/03/19 0356 11/05/19 0356 11/05/19 0357 11/06/19 0339  WBC 8.7 8.0 8.6 9.6  --  9.7  PLT 342 322 328 353  --  354  CRP  --   --   --  9.8*  --  11.4*  BNP  --   --   --   --  36.0  --   DDIMER  --   --   --  5.95*  --   --   PROCALCITON  --   --   --  <0.10  --   --   AST 19 17 17 25   --  21  ALT 18 17 17 17   --  18  ALKPHOS 89 92 85 84  --  89  BILITOT 0.6 0.7 0.8 0.7  --  0.7  ALBUMIN 2.0* 2.1* 2.1* 2.2*  --  2.3*    Sepsis with MSSA and Group B strep bacteremia due to left foot abscess.  ID following, was on IV Ancef but switch to nafcillin on 10/26/2019.  Clean TEE, history of Charcot joints, peripheral neuropathy and recent left foot soft tissue injury, MRI left foot shows an abscess, Ortho was consulted and they recommended medical management with outpatient follow-up with Dr. [name] post discharge, also has osteomyelitis, defer duration of treatment with antibiotics to ID.  Antibiotics management per ID, plan to continue total of 4 weeks of IV nafcillin, through 9/29, then start Keflex 500 mg 4 times daily, he has a follow-up arranged with Dr. [name] on October 13 at 10:30 AM .  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  For seizures he has been started on Keppra with good effect and his mentation has improved considerably. Continue PT OT and speech, SNF VS CIR .  Neuro has seen the patient this admission.   Possible systolic heart failure as shown by TTE however this could have been transient due to sepsis, on repeat TEE his EF is now normalized to 50 to 55%.  He is currently compensated, blood pressure was too low and after hydration  has improved, continue beta-blocker.   Cyanotic right lower extremity toes.  Likely due to hypoperfusion which is transient, arterial ultrasound stable. Resolved.   History of chronic pain.  On multiple narcotics, Flexeril and benzodiazepines.  Was off of these medications for several days without any discomfort, reintroduced on his request at very low doses.  Do not escalate.  HTN - BP was low on multiple blood pressure medications, was hydrated now able to tolerate low-dose beta-blocker.  Acute DVT in the right lower extremity.  Switched to full dose Lovenox.   Hypomagnesemia.  Replaced aggressively and now resolved, will monitor.  DM type II.  Currently on Lantus and sliding scale monitor.  Lab Results  Component Value Date   HGBA1C 11.3 (H) 10/26/2019   CBG (last 3)  Recent Labs    11/05/19 2325 11/06/19 0329 11/06/19 0749  GLUCAP 150* 166* 140*      Condition -   Guarded  Family Communication  :  Sister and mother [phone] - 10/25/19, 10/27/19, 10/28/19, 11/03/19  Code Status :  Full  Consults  :  PCCM, ID, Cards  Procedures  :    EEG - This study showed evidence of potential epileptogenicity arising from bilateral frontocentral, maximal vertex region. The sharp waves are at times rhythmic consistent with brief ictal-interictal rhythmic discharges. Additionally, there is evidence of mild diffuse encephalopathy, nonspecific etiology.  Bilateral lower extremity venous ultrasound - Acute right lower extremity popliteal vein DVT.  Carotid duplex.  Nonacute.  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  CT Head -  1. No acute intracranial abnormality. 2. Area of subcortical low-density involving the posterior left frontal lobe at the convexity, likely encephalomalacia and sequela of remote infarct. There are no prior exams for comparison to establish chronicity. MRI could be  considered for further evaluation based on clinical concern. 3. Paranasal sinus fluid levels on opacification of the right greater than left mastoid air cells, possibly related to intubation.  R. Leg Arterial US -  No evidence of arterial occlusive disease. Normal waveforms, no stenosis  TTE -  1. Extremely poor acoustic windows. LVEF appears to be depressed with diffuse hypokinesis, worse in the mid/distal inferior/inferoseptal walls. . Left ventricular ejection fraction, by estimation, is 30 to 35%. The left ventricle has moderately decreased function. The left ventricular internal cavity size was mildly dilated. Left ventricular diastolic parameters are indeterminate.  2. Right ventricular systolic function is moderately reduced. The right ventricular size is mildly enlarged.  3. The mitral valve is normal in structure. Trivial mitral valve regurgitation.  4. The aortic valve is normal in structure. Aortic valve regurgitation is not visualized  TEE - 1. Left ventricular ejection fraction, by estimation, is 50 to 55%. The left ventricle has low normal function. The left ventricle has no regional wall motion abnormalities.  2. Right ventricular systolic function is normal. The right ventricular size is normal.  3. No left atrial/left atrial appendage thrombus was detected.  4. The mitral valve is normal in structure. Trivial mitral valve regurgitation. No evidence of mitral stenosis.  5. The aortic valve is normal in structure. Aortic valve regurgitation is not visualized. No aortic stenosis is present.  6. The inferior vena cava is normal in size with greater than 50% respiratory variability, suggesting right atrial pressure of 3 mmHg.  CT - 1. No acute intracranial abnormality. 2. Area of subcortical low-density involving the posterior left frontal lobe at the convexity, likely encephalomalacia and sequela of remote infarct. There are no prior exams for comparison to establish chronicity. MRI could be  considered for further evaluation based on clinical concern. 3. Paranasal sinus fluid levels on opacification of the right greater than left mastoid air cells, possibly related to intubation    PUD Prophylaxis : PPI  Disposition Plan  :    Status is: Inpatient  Remains inpatient appropriate because:IV treatments appropriate due to intensity of illness or inability to take PO   Dispo: The patient is from: Home              Anticipated d/c is to: Home              Anticipated d/c date is: 1 day              Patient currently is medically stable to d/c. to CIR when bed is available.   DVT Prophylaxis  :  Lovenox    Lab Results  Component Value Date   PLT 354 11/06/2019    Diet :  Diet Order            Diet heart healthy/carb modified Room service appropriate? No; Fluid consistency: Thin  Diet effective now                  Inpatient Medications  Scheduled Meds: . alteplase  2 mg Intracatheter Once  . atorvastatin  40 mg Oral Daily  . buPROPion  150 mg Oral Daily  . Chlorhexidine Gluconate Cloth  6 each Topical Daily  . docusate sodium  100 mg Oral BID  . enoxaparin (LOVENOX) injection  120 mg Subcutaneous Q12H  . feeding supplement (ENSURE ENLIVE)  237 mL Oral TID BM  . insulin aspart  0-15 Units Subcutaneous Q4H  . insulin glargine  20 Units Subcutaneous Daily  . levETIRAcetam  500 mg Oral BID  . linagliptin  5 mg Oral Daily  . melatonin  3 mg Oral QHS  . metoprolol tartrate  50 mg Oral BID  . pantoprazole  40 mg Oral Daily  . polyethylene glycol  17 g Oral Daily  . senna  2 tablet Oral BID  . sodium chloride flush  10-40 mL Intracatheter Q12H   Continuous Infusions: . sodium chloride 10 mL/hr at 11/02/19 0600  . nafcillin (NAFCIL) continuous infusion 12 g (11/06/19 1007)   PRN Meds:.sodium chloride, acetaminophen, bisacodyl, cyclobenzaprine, HYDROcodone-acetaminophen, Ipratropium-Albuterol  Antibiotics  :    Anti-infectives (From admission, onward)    Start     Dose/Rate Route Frequency Ordered Stop   10/31/19 1415  nafcillin 12 g in sodium chloride 0.9 % 500 mL continuous infusion  Status:  Discontinued        12 g 20.8 mL/hr over 24 Hours Intravenous Every 24 hours 10/31/19 0608 10/31/19 0609   10/29/19 1415  nafcillin 12 g in dextrose 5 % 500 mL IVPB  Status:  Discontinued        12 g 20.8 mL/hr over 24 Hours Intravenous Every 24 hours 10/29/19 0434 10/29/19 0507   10/29/19 1415  nafcillin 12 g in sodium chloride 0.9 % 500 mL continuous infusion        12 g 20.8 mL/hr over 24 Hours Intravenous Every 24 hours 10/29/19 0507     10/26/19 1415  nafcillin 12 g in dextrose 5 % 500 mL IVPB  Status:  Discontinued        12 g 20.8 mL/hr over 24 Hours Intravenous Every 24 hours 10/26/19 1414 10/29/19 0434   10/26/19 1400  nafcillin injection 12 g  Status:  Discontinued        12 g Intravenous Daily 10/26/19 1238 10/26/19 1242   10/26/19 1400  nafcillin 12 g in dextrose 5 % 50 mL IVPB  Status:  Discontinued        12 g 100 mL/hr over 30 Minutes Intravenous Every 24 hours 10/26/19 1242 10/26/19 1339   10/26/19 1400  nafcillin 12 g in dextrose 5 % 500 mL IVPB  Status:  Discontinued        12 g 20.8 mL/hr over 24 Hours Intravenous Every 24 hours 10/26/19 1337 10/26/19 1416   10/26/19 1200  nafcillin 2 g in sodium chloride 0.9 % 100 mL IVPB  Status:  Discontinued        2 g 200 mL/hr over 30 Minutes Intravenous Every 4 hours 10/26/19 1010 10/26/19 1238   10/12/19 2200  cefTRIAXone (ROCEPHIN) 2 g in sodium chloride 0.9 % 100 mL IVPB  Status:  Discontinued        2 g 200 mL/hr over 30 Minutes Intravenous Every 24 hours 10/12/19 0441 10/12/19 0921   10/12/19 0930  ceFAZolin (ANCEF) IVPB 2g/100 mL premix  Status:  Discontinued        2 g 200 mL/hr over 30 Minutes Intravenous Every 8 hours 10/12/19 0921 10/26/19 1010   10/11/19 1000  remdesivir 100 mg in sodium chloride 0.9 % 100 mL IVPB       "  Followed by" Linked Group Details   100 mg 200 mL/hr  over 30 Minutes Intravenous Daily 10/10/19 0050 10/14/19 0956   10/11/19 0800  vancomycin (VANCOCIN) IVPB 1000 mg/200 mL premix  Status:  Discontinued        1,000 mg 200 mL/hr over 60 Minutes Intravenous Every 8 hours 10/10/19 2239 10/12/19 0921   10/10/19 2230  vancomycin (VANCOCIN) IVPB 1000 mg/200 mL premix        1,000 mg 200 mL/hr over 60 Minutes Intravenous Every 1 hr x 2 10/10/19 2208 10/11/19 0052   10/10/19 2215  cefTRIAXone (ROCEPHIN) 2 g in sodium chloride 0.9 % 100 mL IVPB  Status:  Discontinued        2 g 200 mL/hr over 30 Minutes Intravenous Every 24 hours 10/10/19 2205 10/12/19 0441   10/10/19 0100  remdesivir 100 mg in sodium chloride 0.9 % 100 mL IVPB       "Followed by" Linked Group Details   100 mg 200 mL/hr over 30 Minutes Intravenous Every 30 min 10/10/19 0050 10/10/19 0332        Susa Raring M.D on 11/06/2019 at 11:31 AM  To page go to [email or web address] -  [facility] -  Office  [phone]   See all Orders from today for further details    Objective:   Vitals:   11/05/19 2000 11/05/19 2048 11/06/19 0148 11/06/19 0334  BP: 124/61 113/65 112/62 103/90  Pulse: 100 95 96 83  Resp: 20 17 17 17   Temp: 97.8 F (36.6 C) 98.6 F (37 C) 98.4 F (36.9 C) 97.9 F (36.6 C)  TempSrc: Oral Oral Oral Oral  SpO2: 95% 96% 96% 98%  Weight:      Height:        Wt Readings from Last 3 Encounters:  11/05/19 116.1 kg  09/11/19 111.1 kg  05/12/17 (!) 145.2 kg     Intake/Output Summary (Last 24 hours) at 11/06/2019 1131 Last data filed at 11/06/2019 0816 Gross per 24 hour  Intake 468.19 ml  Output 2000 ml  Net -1531.81 ml     Physical Exam  Awake Alert, No new F.N deficits, chronic bilateral rotator cuff injury induced bilateral upper extremity weakness in the deltoid area, per patient this is old and unchanged,  chronic foot wounds on both feet under bandage [place].AT,PERRAL Supple Neck,No JVD, No cervical lymphadenopathy appriciated.  Symmetrical  Chest wall movement, Good air movement bilaterally, CTAB RRR,No Gallops, Rubs or new Murmurs, No Parasternal Heave +ve B.Sounds, Abd Soft, No tenderness, No organomegaly appriciated, No rebound - guarding or rigidity. No Cyanosis, Clubbing or edema, No new Rash or bruise    Data Review:    CBC Recent Labs  Lab 11/01/19 0152 11/02/19 0342 11/03/19 0356 11/05/19 0356 11/06/19 0339  WBC 8.7 8.0 8.6 9.6 9.7  HGB 9.7* 9.7* 9.6* 10.1* 10.2*  HCT 30.8* 30.5* 30.2* 30.9* 32.3*  PLT 342 322 328 353 354  MCV 93.6 93.8 93.8 92.8 94.4  MCH 29.5 29.8 29.8 30.3 29.8  MCHC 31.5 31.8 31.8 32.7 31.6  RDW 12.7 12.9 13.1 13.2 13.4  LYMPHSABS 2.5 2.6 2.6 3.0 3.1  MONOABS 0.5 0.6 0.7 0.8 0.7  EOSABS 0.3 0.4 0.4 0.5 0.5  BASOSABS 0.0 0.0 0.1 0.0 0.0    Recent Labs  Lab 11/01/19 0152 11/01/19 0152 11/02/19 0342 11/03/19 0356 11/04/19 1347 11/05/19 0356 11/05/19 0357 11/06/19 0339  NA 133*  --  135 136  --  135  --  134*  K 3.6  --  3.8 3.6  --  4.0  --  4.0  CL 97*  --  98 102  --  100  --  98  CO2 24  --  25 24  --  25  --  26  GLUCOSE 228*  --  167* 105*  --  155*  --  163*  BUN 6  --  5* <5*  --  5*  --  <5*  CREATININE 0.62  --  0.74 0.70  --  0.76  --  0.78  CALCIUM 8.5*  --  8.7* 8.8*  --  8.9  --  8.9  AST 19  --  17 17  --  25  --  21  ALT 18  --  17 17  --  17  --  18  ALKPHOS 89  --  92 85  --  84  --  89  BILITOT 0.6  --  0.7 0.8  --  0.7  --  0.7  ALBUMIN 2.0*  --  2.1* 2.1*  --  2.2*  --  2.3*  MG 1.5*   < > 1.8 1.4* 1.8 1.6*  --  1.8  CRP  --   --   --   --   --  9.8*  --  11.4*  DDIMER  --   --   --   --   --  5.95*  --   --   PROCALCITON  --   --   --   --   --  <0.10  --   --   BNP  --   --   --   --   --   --  36.0  --    < > = values in this interval not displayed.    ------------------------------------------------------------------------------------------------------------------ No results for input(s): CHOL, HDL, LDLCALC, TRIG, CHOLHDL, LDLDIRECT in the  last 72 hours.  Lab Results  Component Value Date   HGBA1C 11.3 (H) 10/26/2019   ------------------------------------------------------------------------------------------------------------------ No results for input(s): TSH, T4TOTAL, T3FREE, THYROIDAB in the last 72 hours.  Invalid input(s): FREET3  Cardiac Enzymes No results for input(s): CKMB, TROPONINI, MYOGLOBIN in the last 168 hours.  Invalid input(s): CK ------------------------------------------------------------------------------------------------------------------    Component Value Date/Time   BNP 36.0 11/05/2019 0357    Micro Results No results found for this or any previous visit (from the past 240 hour(s)).  Radiology Reports CT HEAD WO CONTRAST  Result Date: 10/19/2019 CLINICAL DATA:  Deliriums.  Mental status change. EXAM: CT HEAD WITHOUT CONTRAST TECHNIQUE: Contiguous axial images were obtained from the base of the skull through the vertex without intravenous contrast. COMPARISON:  None. FINDINGS: Brain: Area subcortical low-density involving the posterior left frontal lobe at the convexity, series 3, image 25 and series 5, image 49, likely encephalomalacia and sequela of remote infarct, however nonspecific. No hemorrhage. No evidence of acute ischemia. No hydrocephalus, midline shift or mass effect. No subdural or extra-axial collection. Vascular: Atherosclerosis of skullbase vasculature without hyperdense vessel or abnormal calcification. Skull: No fracture or focal lesion. Sinuses/Orbits: Nasogastric tube in place. Fluid levels in the sphenoid sinuses and right maxillary sinus with scattered opacification of ethmoid air cells may be related to intubation. Opacification of the right greater than left mastoid air cells. Other: None. IMPRESSION: 1. No acute intracranial abnormality. 2. Area of subcortical low-density involving the posterior left frontal lobe at the convexity, likely encephalomalacia and sequela of remote  infarct. There are no prior exams for comparison to establish chronicity.  MRI could be considered for further evaluation based on clinical concern. 3. Paranasal sinus fluid levels on opacification of the right greater than left mastoid air cells, possibly related to intubation. Electronically Signed   By: Narda Rutherford M.D.   On: 10/19/2019 15:30   CT ANGIO CHEST PE W OR WO CONTRAST  Result Date: 10/23/2019 CLINICAL DATA:  COVID pneumonia, acute hypoxic respiratory failure, positive D-dimer EXAM: CT ANGIOGRAPHY CHEST WITH CONTRAST TECHNIQUE: Multidetector CT imaging of the chest was performed using the standard protocol during bolus administration of intravenous contrast. Multiplanar CT image reconstructions and MIPs were obtained to evaluate the vascular anatomy. CONTRAST:  29mL OMNIPAQUE IOHEXOL 350 MG/ML SOLN COMPARISON:  None. FINDINGS: Cardiovascular: Satisfactory opacification of the pulmonary arteries to the segmental level. No evidence of pulmonary embolism. The central pulmonary arteries are of normal caliber. There is moderate coronary artery calcification. Normal heart size. No pericardial effusion. The thoracic aorta is unremarkable save for bovine arch anatomy. Right upper extremity PICC line tip is seen within the superior vena cava. Mediastinum/Nodes: No enlarged mediastinal, hilar, or axillary lymph nodes. Thyroid gland, trachea, and esophagus demonstrate no significant findings. Nasoenteric feeding tube extends into the upper abdomen beyond the margin of the examination. Lungs/Pleura: Lung volumes are small. Evaluation is limited by motion artifact, however, multifocal pulmonary infiltrates are identified demonstrating a peripheral and basal predominance, likely infectious or inflammatory in the acute setting. No central obstructing lesion. No pneumothorax or pleural effusion. No superimposed interstitial edema. Upper Abdomen: No acute abnormality. Musculoskeletal: No chest wall abnormality.  No acute or significant osseous findings. Review of the MIP images confirms the above findings. IMPRESSION: 1. No evidence of pulmonary embolism. 2. Multifocal pulmonary infiltrates are identified demonstrating a peripheral and basal predominance, likely infectious or inflammatory in the acute setting. 3. Moderate coronary artery calcification. Electronically Signed   By: Helyn Numbers MD   On: 10/23/2019 16:25   MR ANGIO HEAD WO CONTRAST  Result Date: 10/29/2019 CLINICAL DATA:  Neuro deficit with stroke suspected EXAM: MRA HEAD WITHOUT CONTRAST TECHNIQUE: Angiographic images of the Circle of Willis were obtained using MRA technique without intravenous contrast. COMPARISON:  Four days ago FINDINGS: History of bacteremia and embolic infarction. No vessel beading, stenosis, or pseudoaneurysm. IMPRESSION: Negative intracranial MRA. Electronically Signed   By: Marnee Spring M.D.   On: 10/29/2019 12:04   MR BRAIN WO CONTRAST  Result Date: 10/25/2019 CLINICAL DATA:  Neuro deficit, acute, stroke suspected. Additional history provided: COVID positive. Additional history obtained from electronic MEDICAL RECORD NUMBERSepsis with MSSA and group B strep bacteremia. EXAM: MRI HEAD WITHOUT CONTRAST TECHNIQUE: Multiplanar, multiecho pulse sequences of the brain and surrounding structures were obtained without intravenous contrast. COMPARISON:  Head CT 10/19/2019. FINDINGS: Brain: The examination is limited by poor signal on several sequences as well as motion degradation. Most notably, there is moderate motion degradation of the axial T2 weighted sequence, severe motion degradation of the axial SWI sequence and moderate motion degradation of the coronal T2 weighted sequence. Cerebral volume is normal for age. There is a 17 mm focus of restricted diffusion and corresponding T2/FLAIR hyperintensity within the subcortical left frontoparietal lobes (for instance as seen on series 2, image 39) (series 3, image 17). Additional mild  scattered T2/FLAIR hyperintensity within the cerebral white matter and pons is nonspecific, but consistent with chronic small vessel ischemic disease. Severe motion degradation of the axial SWI sequence precludes adequate evaluation for intracranial blood products. No extra-axial fluid collection. No midline shift. Vascular: Expected  proximal arterial flow voids. Skull and upper cervical spine: No focal marrow lesion is identified within described limitations. Sinuses/Orbits: Visualized orbits show no acute finding. Air-fluid levels within the sphenoid and right maxillary sinuses. Partial opacification of right ethmoid air cells. Bilateral mastoid effusions. These results will be called to the ordering clinician or representative by the Radiologist Assistant, and communication documented in the PACS or [employer]. IMPRESSION: Examination limited by poor signal on several sequences as well as motion degradation, as described. 17 mm focus of restricted diffusion and T2/FLAIR hyperintensity within the subcortical left frontoparietal lobes. This finding is nonspecific, but given the patient's history, the primary differential considerations are acute/early subacute infarct versus cerebritis. Consider contrast-enhanced MR imaging of the brain for further evaluation of this lesion. Mild chronic small vessel ischemic disease. Paranasal sinus disease with air-fluid levels. Bilateral mastoid effusions. Electronically Signed   By: Jackey Loge DO   On: 10/25/2019 15:16   MR BRAIN W CONTRAST  Result Date: 10/25/2019 CLINICAL DATA:  Brain mass follow-up EXAM: MRI HEAD WITH CONTRAST TECHNIQUE: Multiplanar, multiecho pulse sequences of the brain and surrounding structures were obtained with intravenous contrast. CONTRAST:  21mL GADAVIST GADOBUTROL 1 MMOL/ML IV SOLN COMPARISON:  Brain MRI without contrast 10/25/2019 FINDINGS: Brain: There is a peripherally enhancing lesion at the base of the left precentral gyrus that  measures 0.9 x 0.7 x 1.8 cm. There are no other areas of abnormal contrast enhancement. 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 Electronically Signed   By: Deatra Robinson M.D.   On: 10/25/2019 22:47   MR FOOT LEFT WO CONTRAST  Result Date: 10/25/2019 CLINICAL DATA:  Bacteremia left foot pain and diabetic EXAM: MRI OF THE LEFT FOOT WITHOUT CONTRAST TECHNIQUE: Multiplanar, multisequence MR imaging of the left was performed. No intravenous contrast was administered. COMPARISON:  October 23, 2019 FINDINGS: Bones/Joint/Cartilage Mildly angulated nondisplaced fractures of the midshaft of the fourth and fifth metatarsals are noted. There is periosteal reaction seen along the medial margins of the metatarsal shafts. Increased heterogeneous T2 hyperintense signal with subtle T1 hypointensity seen at the base of the fourth and fifth metatarsals. No other osseous marrow signal abnormality is seen. There is no large knee joint effusions noted. The articular surfaces appear to be maintained. Ligaments The Lisfranc ligament is intact. Muscles and Tendons Increased feathery signal with atrophy is seen throughout the muscles of the forefoot. There is a small amount of fluid seen surrounding the posterior tibialis tendon. The remainder of the flexor and extensor tendons are intact. Soft tissues Lateral plantar surface of the forefoot overlying the fifth metatarsal base there is a focal area of ulceration measuring approximately 8 mm in transverse dimension. A fluid-filled sinus tract is seen extending to the overlying osseous surface. There is a multilocular fluid collection which extends around the dorsal surface of the fifth metatarsal measuring approximately 5.4 x 0.9 x 3.4 cm. There is extensive dorsal and lateral subcutaneous edema and skin thickening. IMPRESSION: Superficial area of ulceration over the plantar lateral aspect  of the fifth metatarsal base with a fluid-filled sinus tract and loculated probable abscess extending over the dorsal surface of the fifth metatarsal measuring 5.4 x 0.9 by is a 3.4 cm. Incomplete mildly angulated fourth and fifth metatarsal shaft fractures with periosteal reaction Findings which could be suggestive of reactive marrow versus early osteomyelitis involving the base of the fourth and fifth metatarsals. Electronically Signed   By:  Jonna Clark M.D.   On: 10/25/2019 19:06   DG Chest Port 1 View  Result Date: 10/26/2019 CLINICAL DATA:  Short of breath.  COVID positive EXAM: PORTABLE CHEST 1 VIEW COMPARISON:  10/25/2019 FINDINGS: Hypoventilation with decreased lung volume. Mild bibasilar airspace disease unchanged. No new infiltrate or effusion. Right arm PICC tip in the SVC unchanged. IMPRESSION: Hypoventilation with bibasilar mild airspace disease unchanged. Electronically Signed   By: Marlan Palau M.D.   On: 10/26/2019 16:10   DG Chest Port 1 View  Result Date: 10/25/2019 CLINICAL DATA:  Shortness of breath. EXAM: PORTABLE CHEST 1 VIEW COMPARISON:  October 16, 2019. FINDINGS: The heart size and mediastinal contours are within normal limits. Hypoinflation of the lungs is noted with mild bibasilar subsegmental atelectasis. Endotracheal tube has been removed. Right-sided PICC line is unchanged in position. The visualized skeletal structures are unremarkable. IMPRESSION: Hypoinflation of the lungs with mild bibasilar subsegmental atelectasis. Electronically Signed   By: Lupita Raider M.D.   On: 10/25/2019 08:37   DG CHEST PORT 1 VIEW  Result Date: 10/16/2019 CLINICAL DATA:  Status post intubation. EXAM: PORTABLE CHEST 1 VIEW COMPARISON:  10/14/2019 FINDINGS: ET tube tip is above the carina. There is a right arm PICC line with tip at the cavoatrial junction. Lung volumes are low. Similar appearance of bilateral pulmonary opacities compatible with multifocal pneumonia. IMPRESSION: 1. Stable  support apparatus. 2. Persistent bilateral pulmonary opacities compatible with multifocal pneumonia. Electronically Signed   By: Signa Kell M.D.   On: 10/16/2019 06:14   DG Chest Port 1 View  Result Date: 10/15/2019 CLINICAL DATA:  49 year old male with history of COVID infection. EXAM: PORTABLE CHEST 1 VIEW COMPARISON:  Chest x-ray 10/14/2019. FINDINGS: An endotracheal tube is in place with tip 3.6 cm above the carina. A nasogastric tube is seen extending into the stomach, however, the tip of the nasogastric tube extends below the lower margin of the image. There is a right upper extremity PICC with tip terminating in the right atrium. Lung volumes are low. Widespread patchy areas of interstitial prominence an ill-defined airspace disease again noted throughout the lungs bilaterally. Overall, aeration is essentially unchanged. No pleural effusions. No evidence of pulmonary edema. No pneumothorax. Heart size is normal. Upper mediastinal contours are within normal limits. IMPRESSION: 1. Support apparatus, as above. 2. The appearance the chest is compatible with multilobar pneumonia from reported COVID infection. Electronically Signed   By: Trudie Reed M.D.   On: 10/15/2019 05:39   DG Chest Port 1 View  Result Date: 10/14/2019 CLINICAL DATA:  COVID-19, ARDS EXAM: PORTABLE CHEST 1 VIEW COMPARISON:  10/13/2019 FINDINGS: Single frontal view of the chest demonstrates stable position of the endotracheal tube and enteric catheter. There is a right-sided PICC tip overlying superior vena cava. The cardiac silhouette is stable. Interstitial and ground-glass opacities are seen throughout the lungs bilaterally, unchanged. Lung volumes are diminished. No effusion or pneumothorax. IMPRESSION: 1. 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 2. Stable support devices. Electronically Signed   By: Sharlet Salina M.D.   On:  10/14/2019 15:57   DG Chest Port 1 View  Result Date: 10/13/2019 CLINICAL DATA:  Acute respiratory failure with hypoxemia, COVID positive, asthma, diabetes mellitus, hypertension, CHF EXAM: PORTABLE CHEST 1 VIEW COMPARISON:  Portable exam 1021 hours compared to 10/12/2019 FINDINGS: Tip of endotracheal tube projects 3.7 cm above carina. Nasogastric tube extends into stomach. RIGHT arm PICC line  tip projects over cavoatrial junction. Stable heart size mediastinal contours for technique. Patchy BILATERAL airspace infiltrates consistent with multifocal pneumonia, minimally improved. No pleural effusion or pneumothorax. IMPRESSION: Minimally improved pulmonary infiltrates. Electronically Signed   By: Ulyses Southward M.D.   On: 10/13/2019 10:37   DG CHEST PORT 1 VIEW  Result Date: 10/12/2019 CLINICAL DATA:  Intubation EXAM: PORTABLE CHEST 1 VIEW COMPARISON:  Earlier today FINDINGS: New endotracheal tube with tip at the clavicular heads, measuring 3 cm above the carina. Low volume chest with severe airspace disease. No visible effusion or air leak. Stable heart size which is distorted by positioning and volumes. IMPRESSION: 1. Unremarkable positioning of the endotracheal tube. 2. Stable extensive airspace disease with low lung volumes. Electronically Signed   By: Marnee Spring M.D.   On: 10/12/2019 07:13   DG CHEST PORT 1 VIEW  Result Date: 10/12/2019 CLINICAL DATA:  Shortness of breath EXAM: PORTABLE CHEST 1 VIEW COMPARISON:  Three days ago FINDINGS: Very low volume chest with diffuse and patchy pulmonary opacity. Vascular pedicle widening and prominent heart size largely related to technique. No visible effusion or pneumothorax. IMPRESSION: Worsening multifocal pneumonia.  Lung volumes remain very low. Electronically Signed   By: Marnee Spring M.D.   On: 10/12/2019 04:07   DG Chest Portable 1 View  Result Date: 10/09/2019 CLINICAL DATA:  Cough, fever, shortness of breath EXAM: PORTABLE CHEST 1 VIEW  COMPARISON:  None. FINDINGS: Single frontal view of the chest demonstrates unremarkable cardiac silhouette. The lung volumes are diminished, with patchy bilateral areas of faint ground-glass airspace disease consistent with multifocal pneumonia. No effusion or pneumothorax. IMPRESSION: 1. Patchy bilateral ground-glass airspace disease consistent with multifocal pneumonia. Electronically Signed   By: Sharlet Salina M.D.   On: 10/09/2019 19:32   DG Abd Portable 1V  Result Date: 10/20/2019 CLINICAL DATA:  GI problem. EXAM: PORTABLE ABDOMEN - 1 VIEW COMPARISON:  10/16/2019 FINDINGS: Feeding tube tip is in the distal stomach or proximal duodenum. Mild gaseous distention of the stomach. Gas throughout nondistended large and small bowel. No evidence of bowel obstruction, organomegaly or free air. IMPRESSION: Feeding tube tip in the distal stomach or proximal duodenum. No evidence of obstruction or free air. Electronically Signed   By: Charlett Nose M.D.   On: 10/20/2019 21:28   DG Abd Portable 1V  Result Date: 10/16/2019 CLINICAL DATA:  Status post OG tube placement EXAM: PORTABLE ABDOMEN - 1 VIEW COMPARISON:  10/16/2019 FINDINGS: The enteric tube tip projects over the distal body of stomach in the side port is below the level of the GE junction. A few air-filled loops of small bowel are noted within the upper abdomen which measure up to 2.8 cm. IMPRESSION: Satisfactory position of enteric tube. Electronically Signed   By: Signa Kell M.D.   On: 10/16/2019 06:52   DG Abd Portable 1V  Result Date: 10/12/2019 CLINICAL DATA:  Onychogryphosis EXAM: PORTABLE ABDOMEN - 1 VIEW COMPARISON:  Portable exam 0830 hours without priors for comparison FINDINGS: Tip of nasogastric tube projects over distal antrum. Nonobstructive bowel gas pattern. No bowel dilatation or bowel wall thickening. Patchy bibasilar infiltrates. No acute osseous findings. IMPRESSION: Tip of nasogastric tube projects over distal gastric antrum.  Nonobstructive bowel gas pattern. Bibasilar pulmonary infiltrates. Electronically Signed   By: Ulyses Southward M.D.   On: 10/12/2019 08:51   DG Foot 2 Views Left  Result Date: 10/23/2019 CLINICAL DATA:  Bacteremia, LEFT foot pain and diabetic. EXAM: LEFT FOOT - 2 VIEW COMPARISON:  10/13/2019 and prior radiographs FINDINGS: Mildly angulated fractures of the 4th and 5th metatarsals are again noted with fracture lines still evident. Healing changes along these fractures are noted. Overlying soft tissue swelling is noted. No new fracture, subluxation or dislocation noted. No definite radiographic evidence of acute osteomyelitis noted. IMPRESSION: Mildly angulated healing fractures of the 4th and 5th metatarsals with soft tissue swelling again noted but without definite radiographic evidence of acute osteomyelitis. Consider MRI for further evaluation as clinically indicated. Electronically Signed   By: Harmon Pier M.D.   On: 10/23/2019 13:40   DG Foot 2 Views Left  Result Date: 10/13/2019 CLINICAL DATA:  49 year old male with diabetic foot. EXAM: LEFT FOOT - 2 VIEW COMPARISON:  Left foot radiograph dated 09/11/2019. FINDINGS: Evaluation is limited due to positioning. Fractures of the midportion of the fourth and fifth metacarpal with interval progression of bridging callus formation. No new fracture identified. There is no dislocation. There is diffuse soft tissue swelling of the foot. No radiopaque foreign object or soft tissue gas. IMPRESSION: 1. Healing fractures of the fourth and fifth metacarpal. 2. Diffuse soft tissue swelling. No radiopaque foreign object or soft tissue gas. Electronically Signed   By: Elgie Collard M.D.   On: 10/13/2019 16:49   EEG adult  Result Date: 10/27/2019 Charlsie Quest, MD     10/28/2019 10:13 AM Patient Name: DONDRELL LOUDERMILK MRN: [id number] Epilepsy Attending: Charlsie Quest Referring Physician/Provider: Dr. [name] Date: 10/27/2019 Duration: 22.22 minutes Patient  history: 49 year old male with altered mental status. EEG evaluate for seizures. Level of alertness: Awake, asleep AEDs during EEG study: Keppra Technical aspects: This EEG study was done with scalp electrodes positioned according to the 10-20 International system of electrode placement. Electrical activity was acquired at a sampling rate of 500Hz  and reviewed with a high frequency filter of 70Hz  and a low frequency filter of 1Hz . EEG data were recorded continuously and digitally stored. Description: The posterior dominant rhythm consists of 8 Hz activity of moderate voltage (25-35 uV) seen predominantly in posterior head regions, symmetric and reactive to eye opening and eye closing. Sleep was characterized by vertex waves, sleep spindles (12 to 14 Hz), maximal frontocentral region. Frequent runs of sharp waves were seen in frontocentral region, maximal vertex lasting about 5 to 6 seconds consistent with brief ictal-interictal rhythmic discharges. Continuous generalized 5 to 6 Hz theta slowing was also noted. Hyperventilation and photic stimulation were not performed.   ABNORMALITY -Continuous slow, generalized -Brief ictal-interictal rhythmic discharges, bilateral frontocentral region, maximal vertex IMPRESSION: This study showed evidence of potential epileptogenicity arising from bilateral frontocentral, maximal vertex region. The sharp waves are at times rhythmic consistent with brief ictal-interictal rhythmic discharges. Additionally, there is evidence of mild diffuse encephalopathy, nonspecific etiology. Priyanka [name]   Overnight EEG with video  Result Date: 10/28/2019 Charlsie Quest, MD     10/29/2019  8:27 AM Patient Name: KAVAUGHN FAUCETT MRN: [id number] Epilepsy Attending: Charlsie Quest Referring Physician/Provider: Dr. [name] Duration: 10/27/2019 1145 to 10/28/2019 1145  Patient history: 48 year old male with altered mental status. EEG evaluate for seizures.  Level of alertness: Awake,  asleep AEDs during EEG study: Keppra  Technical aspects: This EEG study was done with scalp electrodes positioned according to the 10-20 International system of electrode placement. Electrical activity was acquired at a sampling rate of 500Hz  and reviewed with a high frequency filter of 70Hz  and a low frequency filter of 1Hz . EEG data were recorded continuously and digitally  stored.  Description: The posterior dominant rhythm consists of 8 Hz activity of moderate voltage (25-35 uV) seen predominantly in posterior head regions, symmetric and reactive to eye opening and eye closing. Sleep was characterized by vertex waves, sleep spindles (12 to 14 Hz), maximal frontocentral region. Initially, frequent runs of sharp waves were seen in frontocentral region, maximal vertex lasting about 5 to 6 seconds consistent with brief ictal-interictal rhythmic discharges. Continuous generalized 5 to 6 Hz theta slowing as well as intermittent generalized rhythmic 2-3hz  delta slowing  was also noted. Hyperventilation and photic stimulation were not performed.    ABNORMALITY -Continuous slow, generalized - Intermittent rhythmic delta slowing, generalized -Brief ictal-interictal rhythmic discharges, bilateral frontocentral region, maximal vertex  IMPRESSION: This study initially showed evidence of potential epileptogenicity arising from bilateral frontocentral, maximal vertex region. The sharp waves are at times rhythmic consistent with brief ictal-interictal rhythmic discharges. Additionally, there is evidence of mild diffuse encephalopathy, nonspecific etiology. No seizures were seen during thi study. EEG appears to be improving compared to previous day.  Charlsie Quest   ECHOCARDIOGRAM COMPLETE  Result Date: 10/12/2019    ECHOCARDIOGRAM REPORT   Patient Name:   KAVAN DEVAN Date of Exam: 10/12/2019 Medical Rec #:  [id number]      Height:       74.0 in Accession #:    [id number]     Weight:       245.0 lb Date of Birth:   June 08, 1970      BSA:          2.369 m Patient Age:    49 years       BP:           111/74 mmHg Patient Gender: M              HR:           75 bpm. Exam Location:  Inpatient Procedure: 2D Echo Indications:    CHF 428                  & Bacteremia 790.7  History:        Patient has no prior history of Echocardiogram examinations.                 CHF; Risk Factors:Hypertension and Diabetes. Covid +.  Sonographer:    Celene Skeen RDCS (AE) Referring Phys: [id number] Clarene Critchley [facility]  Sonographer Comments: Echo performed with patient supine and on artificial respirator. Image acquisition challenging due to respiratory motion and Image acquisition challenging due to patient body habitus. restricted mobility IMPRESSIONS  1. Extremely poor acoustic windows. LVEF appears to be depressed with diffuse hypokinesis, worse in the mid/distal inferior/inferoseptal walls. . Left ventricular ejection fraction, by estimation, is 30 to 35%. The left ventricle has moderately decreased function. The left ventricular internal cavity size was mildly dilated. Left ventricular diastolic parameters are indeterminate.  2. Right ventricular systolic function is moderately reduced. The right ventricular size is mildly enlarged.  3. The mitral valve is normal in structure. Trivial mitral valve regurgitation.  4. The aortic valve is normal in structure. Aortic valve regurgitation is not visualized. FINDINGS  Left Ventricle: Extremely poor acoustic windows. LVEF appears to be depressed with diffuse hypokinesis, worse in the mid/distal inferior/inferoseptal walls. Left ventricular ejection fraction, by estimation, is 30 to 35%. The left ventricle has moderately decreased function. The left ventricle demonstrates regional wall motion abnormalities. The left ventricular internal cavity size was mildly dilated. There is no left  ventricular hypertrophy. Left ventricular diastolic parameters are indeterminate. Right Ventricle: The right ventricular size is  mildly enlarged. Right vetricular wall thickness was not assessed. Right ventricular systolic function is moderately reduced. Left Atrium: Left atrial size was normal in size. Right Atrium: Right atrial size was normal in size. Pericardium: There is no evidence of pericardial effusion. Mitral Valve: The mitral valve is normal in structure. Trivial mitral valve regurgitation. Tricuspid Valve: The tricuspid valve is normal in structure. Tricuspid valve regurgitation is trivial. Aortic Valve: The aortic valve is normal in structure. Aortic valve regurgitation is not visualized. Pulmonic Valve: The pulmonic valve was grossly normal. Pulmonic valve regurgitation is trivial. Aorta: The aortic root is normal in size and structure. IAS/Shunts: The interatrial septum was not assessed.  LEFT VENTRICLE PLAX 2D LVIDd:         5.60 cm  Diastology LVIDs:         3.90 cm  LV e' lateral:   8.81 cm/s LV PW:         1.40 cm  LV E/e' lateral: 5.7 LV IVS:        1.10 cm  LV e' medial:    5.87 cm/s LVOT diam:     2.40 cm  LV E/e' medial:  8.5 LV SV:         64 LV SV Index:   27 LVOT Area:     4.52 cm  RIGHT VENTRICLE RV S prime:     9.79 cm/s TAPSE (M-mode): 1.3 cm LEFT ATRIUM             Index       RIGHT ATRIUM           Index LA diam:        3.10 cm 1.31 cm/m  RA Area:     18.80 cm LA Vol (A2C):   62.9 ml 26.55 ml/m RA Volume:   51.80 ml  21.86 ml/m LA Vol (A4C):   40.8 ml 17.22 ml/m LA Biplane Vol: 50.8 ml 21.44 ml/m  AORTIC VALVE LVOT Vmax:   69.80 cm/s LVOT Vmean:  49.400 cm/s LVOT VTI:    0.142 m  AORTA Ao Root diam: 3.60 cm MITRAL VALVE MV Area (PHT): 2.34 cm    SHUNTS MV Decel Time: 324 msec    Systemic VTI:  0.14 m MV E velocity: 50.10 cm/s  Systemic Diam: 2.40 cm MV A velocity: 71.60 cm/s MV E/A ratio:  0.70 Dietrich Pates MD Electronically signed by Dietrich Pates MD Signature Date/Time: 10/12/2019/1:58:41 PM    Final    ECHO TEE  Result Date: 10/24/2019    TRANSESOPHOGEAL ECHO REPORT   Patient Name:   [name] Date  of Exam: 10/24/2019 Medical Rec #:  [id number]      Height:       74.0 in Accession #:    [id number]     Weight:       247.4 lb Date of Birth:  1970-07-30      BSA:          2.379 m Patient Age:    49 years       BP:           120/80 mmHg Patient Gender: M              HR:           100 bpm. Exam Location:  Inpatient Procedure: Transesophageal Echo Indications:    Emobli/Bacteremia  History:  Patient has prior history of Echocardiogram examinations.  Sonographer:    Ross Ludwig RDCS (AE) Referring Phys: [phone] HAO MENG PROCEDURE: The transesophogeal probe was passed without difficulty through the esophogus of the patient. Sedation performed by different physician. The patient's vital signs; including heart rate, blood pressure, and oxygen saturation; remained stable throughout the procedure. The patient developed no complications during the procedure. IMPRESSIONS  1. Left ventricular ejection fraction, by estimation, is 50 to 55%. The left ventricle has low normal function. The left ventricle has no regional wall motion abnormalities.  2. Right ventricular systolic function is normal. The right ventricular size is normal.  3. No left atrial/left atrial appendage thrombus was detected.  4. The mitral valve is normal in structure. Trivial mitral valve regurgitation. No evidence of mitral stenosis.  5. The aortic valve is normal in structure. Aortic valve regurgitation is not visualized. No aortic stenosis is present.  6. The inferior vena cava is normal in size with greater than 50% respiratory variability, suggesting right atrial pressure of 3 mmHg. Conclusion(s)/Recommendation(s): Normal biventricular function without evidence of hemodynamically significant valvular heart disease. FINDINGS  Left Ventricle: Left ventricular ejection fraction, by estimation, is 50 to 55%. The left ventricle has low normal function. The left ventricle has no regional wall motion abnormalities. The left ventricular internal cavity  size was normal in size. There is no left ventricular hypertrophy. Right Ventricle: The right ventricular size is normal. No increase in right ventricular wall thickness. Right ventricular systolic function is normal. Left Atrium: Left atrial size was normal in size. No left atrial/left atrial appendage thrombus was detected. Right Atrium: Right atrial size was normal in size. Pericardium: There is no evidence of pericardial effusion. Mitral Valve: The mitral valve is normal in structure. Normal mobility of the mitral valve leaflets. Trivial mitral valve regurgitation. No evidence of mitral valve stenosis. There is no evidence of mitral valve vegetation. Tricuspid Valve: The tricuspid valve is normal in structure. Tricuspid valve regurgitation is mild . No evidence of tricuspid stenosis. There is no evidence of tricuspid valve vegetation. Aortic Valve: The aortic valve is normal in structure. Aortic valve regurgitation is not visualized. No aortic stenosis is present. There is no evidence of aortic valve vegetation. Pulmonic Valve: The pulmonic valve was normal in structure. Pulmonic valve regurgitation is trivial. No evidence of pulmonic stenosis. Aorta: The aortic root is normal in size and structure. Venous: The inferior vena cava is normal in size with greater than 50% respiratory variability, suggesting right atrial pressure of 3 mmHg. IAS/Shunts: No atrial level shunt detected by color flow Doppler. [name] MD Electronically signed by [name] MD Signature Date/Time: 10/24/2019/3:28:45 PM    Final    VAS US CAROTID (at [facility] and [facility] only)  Result Date: 10/26/2019 Carotid Arterial Duplex Study Indications:       17mm focus of diffusion restriction in the left                    frontoparietal lobe concerning for acute/early subacute                    infarct vs cerebritis. Risk Factors:      Hypertension, Diabetes. Other Factors:     Covid-19, bacteremia. Comparison Study:  No prior study on file  Performing Technologist: Sherren Kerns RVS  Examination Guidelines: A complete evaluation includes B-mode imaging, spectral Doppler, color Doppler, and power Doppler as needed of all accessible portions of each vessel. Bilateral testing  is considered an integral part of a complete examination. Limited examinations for reoccurring indications may be performed as noted.  Right Carotid Findings: +----------+--------+--------+--------+------------------+--------+           PSV cm/sEDV cm/sStenosisPlaque DescriptionComments +----------+--------+--------+--------+------------------+--------+ CCA Prox  66      11                                         +----------+--------+--------+--------+------------------+--------+ CCA Distal71      18                                         +----------+--------+--------+--------+------------------+--------+ ICA Prox  58      19                                         +----------+--------+--------+--------+------------------+--------+ ICA Distal90      33                                         +----------+--------+--------+--------+------------------+--------+ ECA       114     16                                         +----------+--------+--------+--------+------------------+--------+ +----------+--------+-------+--------+-------------------+           PSV cm/sEDV cmsDescribeArm Pressure (mmHG) +----------+--------+-------+--------+-------------------+ [id number]                                        +----------+--------+-------+--------+-------------------+ +---------+--------+--+--------+--+ VertebralPSV cm/s38EDV cm/s11 +---------+--------+--+--------+--+  Left Carotid Findings: +----------+--------+--------+--------+------------------+--------+           PSV cm/sEDV cm/sStenosisPlaque DescriptionComments +----------+--------+--------+--------+------------------+--------+ CCA Prox  55      14                                          +----------+--------+--------+--------+------------------+--------+ CCA Distal65      20                                         +----------+--------+--------+--------+------------------+--------+ ICA Prox  68      20                                         +----------+--------+--------+--------+------------------+--------+ ICA Distal76      23                                         +----------+--------+--------+--------+------------------+--------+ ECA       77      3                                          +----------+--------+--------+--------+------------------+--------+ +----------+--------+--------+--------+-------------------+  PSV cm/sEDV cm/sDescribeArm Pressure (mmHG) +----------+--------+--------+--------+-------------------+ Subclavian88                                          +----------+--------+--------+--------+-------------------+ +---------+--------+--+--------+--+ VertebralPSV cm/s63EDV cm/s18 +---------+--------+--+--------+--+   Summary: Right Carotid: The extracranial vessels were near-normal with only minimal wall                thickening or plaque. Left Carotid: The extracranial vessels were near-normal with only minimal wall               thickening or plaque. Vertebrals:  Bilateral vertebral arteries demonstrate antegrade flow. Subclavians: Normal flow hemodynamics were seen in bilateral subclavian              arteries. *See table(s) above for measurements and observations.  Electronically signed by Delia Heady MD on 10/26/2019 at 12:37:48 PM.    Final    VAS [place] LOWER EXTREMITY ARTERIAL DUPLEX  Result Date: 10/26/2019 LOWER EXTREMITY ARTERIAL DUPLEX STUDY Indications: Ulceration, and Osteomyelitis. High Risk Factors: Hypertension, Diabetes. Other Factors: Covid-19. Charcot foot.  Current ABI: N/A Comparison Study: No prior study Performing Technologist: Sherren Kerns RVS  Examination Guidelines: A  complete evaluation includes B-mode imaging, spectral Doppler, color Doppler, and power Doppler as needed of all accessible portions of each vessel. Bilateral testing is considered an integral part of a complete examination. Limited examinations for reoccurring indications may be performed as noted.  +----------+--------+-----+--------+---------+--------+ LEFT      PSV cm/sRatioStenosisWaveform Comments +----------+--------+-----+--------+---------+--------+ CFA Prox  123                  triphasic         +----------+--------+-----+--------+---------+--------+ DFA       51                   triphasic         +----------+--------+-----+--------+---------+--------+ SFA Prox  97                   triphasic         +----------+--------+-----+--------+---------+--------+ SFA Mid   88                   triphasic         +----------+--------+-----+--------+---------+--------+ SFA Distal81                   triphasic         +----------+--------+-----+--------+---------+--------+ POP Prox  60                   triphasic         +----------+--------+-----+--------+---------+--------+ POP Distal86                   triphasic         +----------+--------+-----+--------+---------+--------+ ATA Distal110                  triphasic         +----------+--------+-----+--------+---------+--------+ PTA Prox  34                   triphasic         +----------+--------+-----+--------+---------+--------+ PTA Mid   29                   triphasic         +----------+--------+-----+--------+---------+--------+ PTA [id number]  triphasic         +----------+--------+-----+--------+---------+--------+  Summary: Left: Normal waveforms and adequate flow noted.  See table(s) above for measurements and observations. Electronically signed by Lemar Livings MD on 10/26/2019 at 4:50:14 PM.    Final    VAS [place] LOWER EXTREMITY ARTERIAL DUPLEX  Result  Date: 10/19/2019 LOWER EXTREMITY ARTERIAL DUPLEX STUDY Indications: Cold leg, Covid-19.  Current ABI: N/A Comparison Study: No prior study Performing Technologist: Sherren Kerns RVS  Examination Guidelines: A complete evaluation includes B-mode imaging, spectral Doppler, color Doppler, and power Doppler as needed of all accessible portions of each vessel. Bilateral testing is considered an integral part of a complete examination. Limited examinations for reoccurring indications may be performed as noted.  +-----------+--------+-----+--------+---------+--------+ RIGHT      PSV cm/sRatioStenosisWaveform Comments +-----------+--------+-----+--------+---------+--------+ CFA Prox   67                   triphasic         +-----------+--------+-----+--------+---------+--------+ DFA        60                   triphasic         +-----------+--------+-----+--------+---------+--------+ SFA Prox   68                   triphasic         +-----------+--------+-----+--------+---------+--------+ SFA Mid    62                   triphasic         +-----------+--------+-----+--------+---------+--------+ SFA Distal 69                   triphasic         +-----------+--------+-----+--------+---------+--------+ POP Prox   47                   triphasic         +-----------+--------+-----+--------+---------+--------+ POP Distal 42                   triphasic         +-----------+--------+-----+--------+---------+--------+ ATA Distal 15                   triphasic         +-----------+--------+-----+--------+---------+--------+ PTA Prox   31                   triphasic         +-----------+--------+-----+--------+---------+--------+ PTA Mid    38                   triphasic         +-----------+--------+-----+--------+---------+--------+ PTA Distal 38                   triphasic         +-----------+--------+-----+--------+---------+--------+ PERO  Distal36                   triphasic         +-----------+--------+-----+--------+---------+--------+  Summary: Right: Normal examination. No evidence of arterial occlusive disease. Normal waveforms, no stenosis.  See table(s) above for measurements and observations. Electronically signed by Sherald Hess MD on 10/19/2019 at 3:25:02 PM.    Final    VAS [place] LOWER EXTREMITY VENOUS (DVT)  Result Date: 10/26/2019  Lower Venous DVTStudy Indications: Covid-19, elevated D-Dimer.  Comparison Study: Prior negative left lower extremity venous duplex don 03/19/17  and is available for comparison. Performing Technologist: Sherren Kerns RVS  Examination Guidelines: A complete evaluation includes B-mode imaging, spectral Doppler, color Doppler, and power Doppler as needed of all accessible portions of each vessel. Bilateral testing is considered an integral part of a complete examination. Limited examinations for reoccurring indications may be performed as noted. The reflux portion of the exam is performed with the patient in reverse Trendelenburg.  +---------+---------------+---------+-----------+----------+--------------+ RIGHT    CompressibilityPhasicitySpontaneityPropertiesThrombus Aging +---------+---------------+---------+-----------+----------+--------------+ CFV      Full           Yes      Yes                                 +---------+---------------+---------+-----------+----------+--------------+ SFJ      Full                                                        +---------+---------------+---------+-----------+----------+--------------+ FV Prox  Full                                                        +---------+---------------+---------+-----------+----------+--------------+ FV Mid   Full                                                        +---------+---------------+---------+-----------+----------+--------------+ FV DistalFull                                                         +---------+---------------+---------+-----------+----------+--------------+ PFV      Full                                                        +---------+---------------+---------+-----------+----------+--------------+ POP      Partial        Yes      Yes                  Acute          +---------+---------------+---------+-----------+----------+--------------+ PTV      Full                                                        +---------+---------------+---------+-----------+----------+--------------+ PERO     Full                                                        +---------+---------------+---------+-----------+----------+--------------+   +---------+---------------+---------+-----------+----------+--------------+  LEFT     CompressibilityPhasicitySpontaneityPropertiesThrombus Aging +---------+---------------+---------+-----------+----------+--------------+ CFV      Full           Yes      Yes                                 +---------+---------------+---------+-----------+----------+--------------+ SFJ      Full                                                        +---------+---------------+---------+-----------+----------+--------------+ FV Prox  Full                                                        +---------+---------------+---------+-----------+----------+--------------+ FV Mid   Full                                                        +---------+---------------+---------+-----------+----------+--------------+ FV DistalFull                                                        +---------+---------------+---------+-----------+----------+--------------+ PFV      Full                                                        +---------+---------------+---------+-----------+----------+--------------+ POP      Full           Yes      Yes                                  +---------+---------------+---------+-----------+----------+--------------+ PTV      Full                                                        +---------+---------------+---------+-----------+----------+--------------+ PERO     Full                                                        +---------+---------------+---------+-----------+----------+--------------+     Summary: RIGHT: - Findings consistent with acute deep vein thrombosis involving the right popliteal vein. - Ultrasound characteristics of enlarged lymph nodes are noted in the groin.  LEFT: - There is no evidence of deep vein thrombosis in the lower extremity.  *  See table(s) above for measurements and observations. Electronically signed by Lemar Livings MD on 10/26/2019 at 4:50:27 PM.    Final    [place] EKG SITE RITE  Result Date: 10/12/2019 If Site Rite image not attached, placement could not be confirmed due to current cardiac rhythm.

## 2019-11-06 NOTE — Progress Notes (Signed)
Inpatient Rehab Admissions Coordinator:  Saw pt at bedside. Notified pt there are no beds available in CIR today or tomorrow. Pt still interested in pursuing CIR.  Will continue to follow.  Wolfgang Phoenix, MS, CCC-SLP Admissions Coordinator [phone]

## 2019-11-06 NOTE — Progress Notes (Signed)
Physical Therapy Treatment Patient Details Name: Micheal Bell MRN: [id number] DOB: 11/19/1970 Today's Date: 11/06/2019    History of Present Illness Pt is a 49 y.o. male admitted 10/09/19 for COVID PNA. Pt decompensated 8/19 and intubated; self extubated 8/23 and reintubated; extubated 8/28. Course complicated by MSSA and GBS bacteria, sepsis, systolic HF, acute metabolic encephalopathy. 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 TEE without evidence of vegitation. 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    PT Comments    Pt was seen for mobility on RW with careful use of side of bed for support, and resulting in controlled standing balance with RW and support to balance.  Pt is in a state of awareness, with good response to therapy cues most of the time. But does need therapy to intervene with safety of balancing to stand, to know how to steady himself to stand and to understand how to set up standing appropriately.    Follow Up Recommendations  CIR     Equipment Recommendations  None recommended by PT    Recommendations for Other Services       Precautions / Restrictions Precautions Precaution Comments: Charcot foot LLE with wound Restrictions Weight Bearing Restrictions: No    Mobility  Bed Mobility Overal bed mobility: Needs Assistance Bed Mobility: Supine to Sit;Sit to Supine   Sidelying to sit: Mod assist Supine to sit: Min assist Sit to supine: Min assist   General bed mobility comments: Assist to elevate trunk into sitting. Verbal cues for technique  Transfers Overall transfer level: Needs assistance Equipment used: Rolling walker (2 wheeled);1 person hand held assist Transfers: Sit to/from  Stand Sit to Stand: Mod assist         General transfer comment: mod to power up and reminders every trial for hand placement  Ambulation/Gait Ambulation/Gait assistance: Mod assist Gait Distance (Feet): 20 Feet (5 x 4) Assistive device: Rolling walker (2 wheeled) Gait Pattern/deviations: Step-to pattern;Wide base of support;Decreased stride length     General Gait Details: sidestepped side of bed with cues to stay close to side of bed   Stairs             Wheelchair Mobility    Modified Rankin (Stroke Patients Only)       Balance Overall balance assessment: Needs assistance Sitting-balance support: Feet supported Sitting balance-Leahy Scale: Fair   Postural control: Posterior lean Standing balance support: Bilateral upper extremity supported;During functional activity Standing balance-Leahy Scale: Poor Standing balance comment: used RW and cues for posture and safety of stanidng                            Cognition Arousal/Alertness: Awake/alert Behavior During Therapy: WFL for tasks assessed/performed;Flat affect Overall Cognitive Status: Impaired/Different from baseline Area of Impairment: Problem solving;Awareness;Safety/judgement;Following commands;Memory;Attention;Orientation                 Orientation Level: Situation Current Attention Level: Selective Memory: Decreased short-term memory Following Commands: Follows one step commands with increased time;Follows one step commands inconsistently Safety/Judgement: Decreased awareness of safety;Decreased awareness of deficits Awareness: Intellectual Problem Solving: Slow processing;Requires verbal cues;Requires tactile cues General Comments: requires help to set limits to walk and to understand safety with standing  Exercises      General Comments General comments (skin integrity, edema, etc.): pt is getting up to walk and stand with boots on and       Pertinent Vitals/Pain  Pain Assessment: No/denies pain    Home Living                      Prior Function            PT Goals (current goals can now be found in the care plan section) Acute Rehab PT Goals Patient Stated Goal: get home and walk farther Progress towards PT goals: Progressing toward goals    Frequency    Min 3X/week      PT Plan Current plan remains appropriate;Frequency needs to be updated    Co-evaluation              AM-PAC PT "6 Clicks" Mobility   Outcome Measure  Help needed turning from your back to your side while in a flat bed without using bedrails?: A Little Help needed moving from lying on your back to sitting on the side of a flat bed without using bedrails?: A Little Help needed moving to and from a bed to a chair (including a wheelchair)?: A Lot Help needed standing up from a chair using your arms (e.g., wheelchair or bedside chair)?: A Lot Help needed to walk in hospital room?: A Lot Help needed climbing 3-5 steps with a railing? : A Lot 6 Click Score: 14    End of Session Equipment Utilized During Treatment: Gait belt Activity Tolerance: Patient tolerated treatment well Patient left: with call bell/phone within reach;in chair;with chair alarm set Nurse Communication: Mobility status PT Visit Diagnosis: Other abnormalities of gait and mobility (R26.89);Muscle weakness (generalized) (M62.81);Other symptoms and signs involving the nervous system (R29.898)     Time: 1749-4496 PT Time Calculation (min) (ACUTE ONLY): 31 min  Charges:  $Gait Training: 8-22 mins $Therapeutic Activity: 8-22 mins                  Ivar Drape 11/06/2019, 8:33 PM  Samul Dada, PT MS Acute Rehab Dept. Number: [facility] [id number] and [facility] [phone]

## 2019-11-06 NOTE — Discharge Instructions (Signed)
Information on my medicine - ELIQUIS (apixaban)  This medication education was reviewed with me or my healthcare representative as part of my discharge preparation.   Micheal Bell, [facility]  Why was Eliquis prescribed for you? Eliquis was prescribed to treat blood clots that may have been found in the veins of your legs (deep vein thrombosis) or in your lungs (pulmonary embolism) and to reduce the risk of them occurring again.  What do You need to know about Eliquis ? The dose is ONE 5 mg tablet taken TWICE daily.  Eliquis may be taken with or without food.   Try to take the dose about the same time in the morning and in the evening. If you have difficulty swallowing the tablet whole please discuss with your pharmacist how to take the medication safely.  Take Eliquis exactly as prescribed and DO NOT stop taking Eliquis without talking to the doctor who prescribed the medication.  Stopping may increase your risk of developing a new blood clot.  Refill your prescription before you run out.  After discharge, you should have regular check-up appointments with your healthcare provider that is prescribing your Eliquis.    What do you do if you miss a dose? If a dose of ELIQUIS is not taken at the scheduled time, take it as soon as possible on the same day and twice-daily administration should be resumed. The dose should not be doubled to make up for a missed dose.  Important Safety Information A possible side effect of Eliquis is bleeding. You should call your healthcare provider right away if you experience any of the following: ? Bleeding from an injury or your nose that does not stop. ? Unusual colored urine (red or dark brown) or unusual colored stools (red or black). ? Unusual bruising for unknown reasons. ? A serious fall or if you hit your head (even if there is no bleeding).  Some medicines may interact with Eliquis and might increase your risk of bleeding or clotting while on  Eliquis. To help avoid this, consult your healthcare provider or pharmacist prior to using any new prescription or non-prescription medications, including herbals, vitamins, non-steroidal anti-inflammatory drugs (NSAIDs) and supplements.  This website has more information on Eliquis (apixaban): http://www.eliquis.com/eliquis/home ==============================================  Deep Vein Thrombosis    Deep vein thrombosis (DVT) is a condition in which a blood clot forms in a deep vein, such as a lower leg, thigh, or arm vein. A clot is blood that has thickened into a gel or solid. This condition is dangerous. It can lead to serious and even life-threatening complications if the clot travels to the lungs and causes a blockage (pulmonary embolism). It can also damage veins in the leg. This can result in leg pain, swelling, discoloration, and sores (post-thrombotic syndrome).  What are the causes? This condition may be caused by:  A slowdown of blood flow.  Damage to a vein.  A condition that causes blood to clot more easily, such as an inherited clotting disorder.   What increases the risk? The following factors may make you more likely to develop this condition: 1. Being overweight. 2. Being older, especially over age 16. 3. Sitting or lying down for more than four hours. 4. Being in the hospital. 5. Lack of physical activity (sedentary lifestyle). 6. Pregnancy, being in childbirth, or having recently given birth. 7. Taking medicines that contain estrogen, such as medicines to prevent pregnancy. 8. Smoking. 9. A history of any of the following: ? Blood  clots or a blood clotting disease. ? Peripheral vascular disease. ? Inflammatory bowel disease. ? Cancer. ? Heart disease. ? Genetic conditions that affect how your blood clots, such as Factor V Leiden mutation. ? Neurological diseases that affect your legs (leg paresis). ? A recent injury, such as a car accident. ? Major or  lengthy surgery. ? A central line placed inside a large vein.  What are the signs or symptoms? Symptoms of this condition include:  Swelling, pain, or tenderness in an arm or leg.  Warmth, redness, or discoloration in an arm or leg. If the clot is in your leg, symptoms may be more noticeable or worse when you stand or walk. Some people may not develop any symptoms.  How is this diagnosed? This condition is diagnosed with: 1. A medical history and physical exam. 2. Tests, such as: ? Blood tests. These are done to check how well your blood clots. ? Ultrasound. This is done to check for clots. ? Venogram. For this test, contrast dye is injected into a vein and X-rays are taken to check for any clots  How is this treated? Treatment for this condition depends on:  The cause of your DVT.  Your risk for bleeding or developing more clots.  Any other medical conditions that you have. Treatment may include: 1. Taking a blood thinner (anticoagulant). This type of medicine prevents clots from forming. It may be taken by mouth, injected under the skin, or injected through an IV (catheter). 2. Injecting clot-dissolving medicines into the affected vein (catheter-directed thrombolysis). 3. Having surgery. Surgery may be done to: ? Remove the clot. ? Place a filter in a large vein to catch blood clots before they reach the lungs. Some treatments may be continued for up to six months.  Follow these instructions at home: If you are taking blood thinners: 1. Take the medicine exactly as told by your health care provider. Some blood thinners need to be taken at the same time every day. Do not skip a dose. 2. Talk with your health care provider before you take any medicines that contain aspirin or NSAIDs. These medicines increase your risk for dangerous bleeding. 3. Ask your health care provider about foods and drugs that could change the way the medicine works (may interact). Avoid those things if  your health care provider tells you to do so. 4. Blood thinners can cause easy bruising and may make it difficult to stop bleeding. Because of this: ? Be very careful when using knives, scissors, or other sharp objects. ? Use an electric razor instead of a blade. ? Avoid activities that could cause injury or bruising, and follow instructions about how to prevent falls. 5. Wear a medical alert bracelet or carry a card that lists what medicines you take.  General instructions  Take over-the-counter and prescription medicines only as told by your health care provider.  Return to your normal activities as told by your health care provider. Ask your health care provider what activities are safe for you.  Wear compression stockings if recommended by your health care provider.  Keep all follow-up visits as told by your health care provider. This is important.  How is this prevented? To lower your risk of developing this condition again: 1. For 30 or more minutes every day, do an activity that: ? Involves moving your arms and legs. ? Increases your heart rate. 2. When traveling for longer than four hours: ? Exercise your arms and legs every hour. ?  Drink plenty of water. ? Avoid drinking alcohol. 3. Avoid sitting or lying for a long time without moving your legs. 4. If you have surgery or you are hospitalized, ask about ways to prevent blood clots. These may include taking frequent walks or using anticoagulants. 5. Stay at a healthy weight. 6. If you are a woman who is older than age 72, avoid unnecessary use of medicines that contain estrogen, such as some birth control pills. 7. Do not use any products that contain nicotine or tobacco, such as cigarettes and e-cigarettes. This is especially important if you take estrogen medicines. If you need help quitting, ask your health care provider.  Contact a health care provider if:  You miss a dose of your blood thinner.  Your menstrual period  is heavier than usual.  You have unusual bruising.  Get help right away if: 1. You have: ? New or increased pain, swelling, or redness in an arm or leg. ? Numbness or tingling in an arm or leg. ? Shortness of breath. ? Chest pain. ? A rapid or irregular heartbeat. ? A severe headache or confusion. ? A cut that will not stop bleeding. 2. There is blood in your vomit, stool, or urine. 3. You have a serious fall or accident, or you hit your head. 4. You feel light-headed or dizzy. 5. You cough up blood.  These symptoms may represent a serious problem that is an emergency. Do not wait to see if the symptoms will go away. Get medical help right away. Call your local emergency services (911 in the [place] Do not drive yourself to the hospital. Summary  Deep vein thrombosis (DVT) is a condition in which a blood clot forms in a deep vein, such as a lower leg, thigh, or arm vein.  Symptoms can include swelling, warmth, pain, and redness in your leg or arm.  This condition may be treated with a blood thinner (anticoagulant medicine), medicine that is injected to dissolve blood clots,compression stockings, or surgery.  If you are prescribed blood thinners, take them exactly as told. This information is not intended to replace advice given to you by your health care provider. Make sure you discuss any questions you have with your health care provider. Document Revised: 01/22/2017 Document Reviewed: 07/10/2016 Elsevier Patient Education  2020 [employer].

## 2019-11-07 DIAGNOSIS — M79676 Pain in unspecified toe(s): Secondary | ICD-10-CM

## 2019-11-07 LAB — CBC WITH DIFFERENTIAL/PLATELET
Abs Immature Granulocytes: 0.08 10*3/uL — ABNORMAL HIGH (ref 0.00–0.07)
Basophils Absolute: 0 10*3/uL (ref 0.0–0.1)
Basophils Relative: 1 %
Eosinophils Absolute: 0.4 10*3/uL (ref 0.0–0.5)
Eosinophils Relative: 4 %
HCT: 29 % — ABNORMAL LOW (ref 39.0–52.0)
Hemoglobin: 9.4 g/dL — ABNORMAL LOW (ref 13.0–17.0)
Immature Granulocytes: 1 %
Lymphocytes Relative: 31 %
Lymphs Abs: 2.5 10*3/uL (ref 0.7–4.0)
MCH: 30.1 pg (ref 26.0–34.0)
MCHC: 32.4 g/dL (ref 30.0–36.0)
MCV: 92.9 fL (ref 80.0–100.0)
Monocytes Absolute: 0.8 10*3/uL (ref 0.1–1.0)
Monocytes Relative: 10 %
Neutro Abs: 4.4 10*3/uL (ref 1.7–7.7)
Neutrophils Relative %: 53 %
Platelets: 305 10*3/uL (ref 150–400)
RBC: 3.12 MIL/uL — ABNORMAL LOW (ref 4.22–5.81)
RDW: 13.6 % (ref 11.5–15.5)
WBC: 8.1 10*3/uL (ref 4.0–10.5)
nRBC: 0 % (ref 0.0–0.2)

## 2019-11-07 LAB — COMPREHENSIVE METABOLIC PANEL
ALT: 17 U/L (ref 0–44)
AST: 17 U/L (ref 15–41)
Albumin: 2.1 g/dL — ABNORMAL LOW (ref 3.5–5.0)
Alkaline Phosphatase: 80 U/L (ref 38–126)
Anion gap: 10 (ref 5–15)
BUN: <5 mg/dL — ABNORMAL LOW (ref 6–20)
CO2: 25 mmol/L (ref 22–32)
Calcium: 8.6 mg/dL — ABNORMAL LOW (ref 8.9–10.3)
Chloride: 101 mmol/L (ref 98–111)
Creatinine, Ser: 0.75 mg/dL (ref 0.61–1.24)
GFR calc Af Amer: >60 mL/min (ref >60)
GFR calc non Af Amer: >60 mL/min (ref >60)
Glucose, Bld: 141 mg/dL — ABNORMAL HIGH (ref 70–99)
Potassium: 3.7 mmol/L (ref 3.5–5.1)
Sodium: 136 mmol/L (ref 135–145)
Total Bilirubin: 0.8 mg/dL (ref 0.3–1.2)
Total Protein: 7.1 g/dL (ref 6.5–8.1)

## 2019-11-07 LAB — GLUCOSE, CAPILLARY
Glucose-Capillary: 119 mg/dL — ABNORMAL HIGH (ref 70–99)
Glucose-Capillary: 124 mg/dL — ABNORMAL HIGH (ref 70–99)
Glucose-Capillary: 131 mg/dL — ABNORMAL HIGH (ref 70–99)
Glucose-Capillary: 132 mg/dL — ABNORMAL HIGH (ref 70–99)
Glucose-Capillary: 139 mg/dL — ABNORMAL HIGH (ref 70–99)
Glucose-Capillary: 149 mg/dL — ABNORMAL HIGH (ref 70–99)
Glucose-Capillary: 182 mg/dL — ABNORMAL HIGH (ref 70–99)

## 2019-11-07 LAB — C-REACTIVE PROTEIN: CRP: 9.8 mg/dL — ABNORMAL HIGH (ref <1.0)

## 2019-11-07 LAB — MAGNESIUM: Magnesium: 1.5 mg/dL — ABNORMAL LOW (ref 1.7–2.4)

## 2019-11-07 MED ORDER — ENSURE MAX PROTEIN PO LIQD
11.0000 [oz_av] | Freq: Every day | ORAL | Status: DC
  Administered 2019-11-08: 11 [oz_av] via ORAL
  Filled 2019-11-07 (×8): qty 330

## 2019-11-07 MED ORDER — MAGNESIUM SULFATE 4 GM/100ML IV SOLN
4.0000 g | Freq: Once | INTRAVENOUS | Status: AC
  Administered 2019-11-07: 4 g via INTRAVENOUS
  Filled 2019-11-07: qty 100

## 2019-11-07 MED ORDER — ADULT MULTIVITAMIN W/MINERALS CH
1.0000 | ORAL_TABLET | Freq: Every day | ORAL | Status: DC
  Administered 2019-11-07 – 2019-11-15 (×9): 1 via ORAL
  Filled 2019-11-07 (×9): qty 1

## 2019-11-07 MED ORDER — MAGNESIUM OXIDE 400 (241.3 MG) MG PO TABS
800.0000 mg | ORAL_TABLET | Freq: Two times a day (BID) | ORAL | Status: DC
  Administered 2019-11-07 – 2019-11-15 (×17): 800 mg via ORAL
  Filled 2019-11-07 (×17): qty 2

## 2019-11-07 NOTE — Progress Notes (Signed)
Physical Therapy Treatment Patient Details Name: Micheal Bell MRN: [id number] DOB: 1970-02-25 Today's Date: 11/07/2019    History of Present Illness Pt is a 49 y.o. male admitted 10/09/19 for COVID PNA. Pt decompensated 8/19 and intubated; self extubated 8/23 and reintubated; extubated 8/28. Course complicated by MSSA and GBS bacteria, sepsis, systolic HF, acute metabolic encephalopathy. 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 TEE without evidence of vegitation. 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    PT Comments    Pt was seen for progression of gait, with new footwear, L foot cam boot and R knee brace.  He is unsafe to transfer alone, due to having a weak R knee and requiring mod assist to control initial standing.  Follow up with him to make transition to CIR, focusing on strength of LE's, balance in standing and safety education.  Per SLP report, he is going to require some repetitive instructions to educate him on safety and control of balance skills.  Reminders for R knee posture in standing, as well as sitting with L knee to control, are a priority.  Follow Up Recommendations  CIR     Equipment Recommendations  None recommended by PT    Recommendations for Other Services       Precautions / Restrictions Precautions Precautions: Fall Precaution Comments: Charcot foot LLE with wound Required Braces or Orthoses: Other Brace Other Brace: L cam boot and R knee sleeve with stays Restrictions Weight Bearing Restrictions: No    Mobility  Bed Mobility Overal bed mobility: Needs Assistance Bed Mobility: Supine to Sit;Sit to Supine Rolling: Min assist Sidelying to sit: Mod assist Supine to sit: Mod assist Sit  to supine: Mod assist   General bed mobility comments: mod to assist legs to bed  Transfers Overall transfer level: Needs assistance Equipment used: Rolling walker (2 wheeled);1 person hand held assist Transfers: Sit to/from Stand Sit to Stand: Mod assist         General transfer comment: mod to power up and support initial standing  Ambulation/Gait Ambulation/Gait assistance: Mod assist;Min assist Gait Distance (Feet): 24 Feet (6' x 4) Assistive device: Rolling walker (2 wheeled) Gait Pattern/deviations: Step-to pattern;Wide base of support;Decreased stride length Gait velocity: reduced Gait velocity interpretation: <1.31 ft/sec, indicative of household ambulator General Gait Details: sidesteps in better LE footwear including cam boot, R shoe and knee brace on R   Stairs             Wheelchair Mobility    Modified Rankin (Stroke Patients Only)       Balance Overall balance assessment: Needs assistance Sitting-balance support: Feet supported Sitting balance-Leahy Scale: Fair     Standing balance support: Bilateral upper extremity supported;During functional activity Standing balance-Leahy Scale: Poor                              Cognition Arousal/Alertness: Awake/alert Behavior During Therapy: WFL for tasks assessed/performed Overall Cognitive Status: Impaired/Different from baseline Area of Impairment: Problem solving;Awareness;Safety/judgement                 Orientation Level: Situation Current Attention Level: Selective Memory: Decreased short-term memory;Decreased recall of precautions Following Commands: Follows one step commands with increased time Safety/Judgement: Decreased awareness  of safety;Decreased awareness of deficits Awareness: Intellectual Problem Solving: Slow processing;Requires verbal cues;Requires tactile cues General Comments: limits for gait and transfers repeated today, pt reports he walked alone last night but  is mod assist to stand      Exercises      General Comments        Pertinent Vitals/Pain Pain Assessment: No/denies pain Pain Score: 0-No pain    Home Living                      Prior Function            PT Goals (current goals can now be found in the care plan section) Acute Rehab PT Goals Patient Stated Goal: go home and see his pets Progress towards PT goals: Progressing toward goals    Frequency    Min 3X/week      PT Plan Current plan remains appropriate;Frequency needs to be updated    Co-evaluation              AM-PAC PT "6 Clicks" Mobility   Outcome Measure  Help needed turning from your back to your side while in a flat bed without using bedrails?: A Little Help needed moving from lying on your back to sitting on the side of a flat bed without using bedrails?: A Little Help needed moving to and from a bed to a chair (including a wheelchair)?: A Lot Help needed standing up from a chair using your arms (e.g., wheelchair or bedside chair)?: A Lot Help needed to walk in hospital room?: A Lot Help needed climbing 3-5 steps with a railing? : Total 6 Click Score: 13    End of Session Equipment Utilized During Treatment: Gait belt Activity Tolerance: Patient tolerated treatment well Patient left: with call bell/phone within reach;in chair;with chair alarm set Nurse Communication: Mobility status PT Visit Diagnosis: Other abnormalities of gait and mobility (R26.89);Muscle weakness (generalized) (M62.81);Other symptoms and signs involving the nervous system (R29.898)     Time: 8756-4332 PT Time Calculation (min) (ACUTE ONLY): 43 min  Charges:  $Gait Training: 23-37 mins $Therapeutic Activity: 8-22 mins             Ivar Drape 11/07/2019, 6:14 PM  Samul Dada, PT MS Acute Rehab Dept. Number: [facility] [id number] and [facility] [phone]

## 2019-11-07 NOTE — Care Management (Addendum)
Placed 30 day free card for Eliquis  , on chart. If patient does have medicaid, Eliquis  is on the preferred medication list, co pay around $3.   If patient has [facility], Eliquis would not be covered. Would use 30 day free card and schedule follow up appointment with PCP to complete assistance paperwork.   Ronny Flurry RN

## 2019-11-07 NOTE — Progress Notes (Signed)
PROGRESS NOTE                                                                                                                                                                                                             Patient Demographics:    Micheal Bell, is a 49 y.o. male, DOB - 1970/11/01, ZOX:[id number]  Outpatient Primary MD for the patient is [name], MD    LOS - 26  Admit date - 10/09/2019    Chief Complaint  Patient presents with  . Cough       Brief Narrative - 49 yo male with hx of HTN, T2DM, gout, IBS, fibromyalgia, obesity who presented on 8/16 for COVID pneumonia.  He decompensated on 8/19 and was intubated.  Self extubated on 8/23 and was reintubated.  He's subsequently been extubated on 8/28.  He's been on typical therapies for covid including steroids, baricitinib and remdesivir.  His hospitalization was complicated by MSSA and GBS bacteria.  Also found to have systolic heart failure.    Subjective:   Patient in bed, appears comfortable, denies any headache, no fever, no chest pain or pressure, no shortness of breath , no abdominal pain. No focal weakness.   Assessment  & Plan :   Acute Hypoxic Resp. Failure due to Acute Covid 19 Viral Pneumonitis during the ongoing 2020 Covid 19 Pandemic - he had severe hypoxia and parenchymal lung injury he was intubated and admitted by ICU, he was started on IV steroids, remdesivir and Baricitinib on 10/12/2019.  He was subsequently extubated on 10/21/2019.  His stay was complicated by bacteremia after which Baricitinib was discontinued on 10/24/2019.  His pulmonary status is gradually improving, currently he is stable and symptom-free, he is on room air, I have discussed with him about incentive spirometry, flutter valve, pulmonary toiletry, he is now off of isolation and his pulmonary issues from COVID-19 is clinically resolved.   Recent Labs  Lab 11/02/19 0342  11/03/19 0356 11/05/19 0356 11/05/19 0357 11/06/19 0339 11/07/19 0350  WBC 8.0 8.6 9.6  --  9.7 8.1  PLT 322 328 353  --  354 305  CRP  --   --  9.8*  --  11.4* 9.8*  BNP  --   --   --  36.0  --   --   DDIMER  --   --  5.95*  --   --   --   PROCALCITON  --   --  <0.10  --   --   --   AST 17 17 25   --  21 17  ALT 17 17 17   --  18 17  ALKPHOS 92 85 84  --  89 80  BILITOT 0.7 0.8 0.7  --  0.7 0.8  ALBUMIN 2.1* 2.1* 2.2*  --  2.3* 2.1*    Sepsis with MSSA and Group B strep bacteremia due to left foot abscess.  ID following, was on IV Ancef but switch to nafcillin on 10/26/2019.  Clean TEE, history of Charcot joints, peripheral neuropathy and recent left foot soft tissue injury, MRI left foot shows an abscess, Ortho was consulted and they recommended medical management with outpatient follow-up with Dr. [name] post discharge, also has osteomyelitis, defer duration of treatment with antibiotics to ID.  Antibiotics management per ID, plan to continue total of 4 weeks of IV nafcillin, through 9/29, then start Keflex 500 mg 4 times daily, he has a follow-up arranged with Dr. [name] on October 13 at 10:30 AM .  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  For seizures he has been started on Keppra with good effect and his mentation has improved considerably. Continue PT OT and speech, SNF VS CIR .  Neuro has seen the patient this admission.   Possible systolic heart failure as shown by TTE however this could have been transient due to sepsis, on repeat TEE his EF is now normalized to 50 to 55%.  He is currently compensated, blood pressure was too low and after hydration  has improved, continue beta-blocker.   Cyanotic right lower extremity toes.  Likely due to hypoperfusion which is transient, arterial ultrasound stable. Resolved.   History of chronic pain.  On multiple narcotics, Flexeril and benzodiazepines.  Was off of these medications for several days without any discomfort, reintroduced on his request at very low doses.  Do not escalate.  HTN - BP was low on multiple blood pressure medications, was hydrated now able to tolerate low-dose beta-blocker.  Acute DVT in the right lower extremity.  Switched to full dose Lovenox will switch to Eliquis on 11/07/2019.   Hypomagnesemia.  Persistently low despite aggressive treatment, have placed on scheduled oral supplementation.  DM type II.  Currently on Lantus and sliding scale monitor.  Lab Results  Component Value Date   HGBA1C 11.3 (H) 10/26/2019   CBG (last 3)  Recent Labs    11/07/19 0017 11/07/19 0401 11/07/19 0755  GLUCAP 131* 132* 139*      Condition -   Guarded  Family Communication  :  Sister and mother [phone] - 10/25/19, 10/27/19, 10/28/19, 11/03/19  Code Status :  Full  Consults  :  PCCM, ID, Cards  Procedures  :    EEG - This study showed evidence of potential epileptogenicity arising from bilateral frontocentral, maximal vertex region. The sharp waves are at times rhythmic consistent with brief ictal-interictal rhythmic discharges. Additionally, there is evidence of mild diffuse encephalopathy, nonspecific etiology.  Bilateral lower extremity venous ultrasound - Acute right  lower extremity popliteal vein DVT.  Carotid duplex.  Nonacute.    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  CT Head -  1. No acute intracranial abnormality. 2. Area of subcortical low-density involving the posterior left frontal lobe at the convexity, likely encephalomalacia and sequela of remote infarct. There  are no prior exams for comparison to establish chronicity. MRI could be considered for further evaluation based on clinical concern. 3. Paranasal sinus fluid levels on opacification of the right greater than left mastoid air cells, possibly related to intubation.  R. Leg Arterial US -  No evidence of arterial occlusive disease. Normal waveforms, no stenosis  TTE -  1. Extremely poor acoustic windows. LVEF appears to be depressed with diffuse hypokinesis, worse in the mid/distal inferior/inferoseptal walls. . Left ventricular ejection fraction, by estimation, is 30 to 35%. The left ventricle has moderately decreased function. The left ventricular internal cavity size was mildly dilated. Left ventricular diastolic parameters are indeterminate.  2. Right ventricular systolic function is moderately reduced. The right ventricular size is mildly enlarged.  3. The mitral valve is normal in structure. Trivial mitral valve regurgitation.  4. The aortic valve is normal in structure. Aortic valve regurgitation is not visualized  TEE - 1. Left ventricular ejection fraction, by estimation, is 50 to 55%. The left ventricle has low normal function. The left ventricle has no regional wall motion abnormalities.  2. Right ventricular systolic function is normal. The right ventricular size is normal.  3. No left atrial/left atrial appendage thrombus was detected.  4. The mitral valve is normal in structure. Trivial mitral valve regurgitation. No evidence of mitral stenosis.  5. The aortic valve is normal in structure. Aortic valve regurgitation is not visualized. No aortic stenosis is present.  6. The inferior vena cava is normal in size with greater than 50% respiratory variability, suggesting right atrial pressure of 3 mmHg.  CT - 1. No acute intracranial abnormality. 2. Area of subcortical low-density involving the posterior left frontal lobe at the convexity, likely encephalomalacia and sequela of remote infarct. There are  no prior exams for comparison to establish chronicity. MRI could be considered for further evaluation based on clinical concern. 3. Paranasal sinus fluid levels on opacification of the right greater than left mastoid air cells, possibly related to intubation    PUD Prophylaxis : PPI  Disposition Plan  :    Status is: Inpatient  Remains inpatient appropriate because:IV treatments appropriate due to intensity of illness or inability to take PO   Dispo: The patient is from: Home              Anticipated d/c is to: Home              Anticipated d/c date is: 1 day              Patient currently is medically stable to d/c. to CIR when bed is available.   DVT Prophylaxis  :  Lovenox    Lab Results  Component Value Date   PLT 305 11/07/2019    Diet :  Diet Order            Diet heart healthy/carb modified Room service appropriate? No; Fluid consistency: Thin  Diet effective now                  Inpatient Medications  Scheduled Meds: . alteplase  2 mg Intracatheter  Once  . apixaban  5 mg Oral BID  . atorvastatin  40 mg Oral Daily  . buPROPion  150 mg Oral Daily  . Chlorhexidine Gluconate Cloth  6 each Topical Daily  . docusate sodium  100 mg Oral BID  . feeding supplement (ENSURE ENLIVE)  237 mL Oral TID BM  . insulin aspart  0-15 Units Subcutaneous Q4H  . insulin glargine  20 Units Subcutaneous Daily  . levETIRAcetam  500 mg Oral BID  . magnesium oxide  800 mg Oral BID  . melatonin  3 mg Oral QHS  . metoprolol tartrate  50 mg Oral BID  . pantoprazole  40 mg Oral Daily  . polyethylene glycol  17 g Oral Daily  . senna  2 tablet Oral BID  . sodium chloride flush  10-40 mL Intracatheter Q12H   Continuous Infusions: . sodium chloride 10 mL/hr at 11/02/19 0600  . magnesium sulfate bolus IVPB    . nafcillin (NAFCIL) continuous infusion 20.8 mL/hr at 11/06/19 1800   PRN Meds:.sodium chloride, acetaminophen, bisacodyl, cyclobenzaprine, HYDROcodone-acetaminophen,  Ipratropium-Albuterol  Antibiotics  :    Anti-infectives (From admission, onward)   Start     Dose/Rate Route Frequency Ordered Stop   10/31/19 1415  nafcillin 12 g in sodium chloride 0.9 % 500 mL continuous infusion  Status:  Discontinued        12 g 20.8 mL/hr over 24 Hours Intravenous Every 24 hours 10/31/19 0608 10/31/19 0609   10/29/19 1415  nafcillin 12 g in dextrose 5 % 500 mL IVPB  Status:  Discontinued        12 g 20.8 mL/hr over 24 Hours Intravenous Every 24 hours 10/29/19 0434 10/29/19 0507   10/29/19 1415  nafcillin 12 g in sodium chloride 0.9 % 500 mL continuous infusion        12 g 20.8 mL/hr over 24 Hours Intravenous Every 24 hours 10/29/19 0507     10/26/19 1415  nafcillin 12 g in dextrose 5 % 500 mL IVPB  Status:  Discontinued        12 g 20.8 mL/hr over 24 Hours Intravenous Every 24 hours 10/26/19 1414 10/29/19 0434   10/26/19 1400  nafcillin injection 12 g  Status:  Discontinued        12 g Intravenous Daily 10/26/19 1238 10/26/19 1242   10/26/19 1400  nafcillin 12 g in dextrose 5 % 50 mL IVPB  Status:  Discontinued        12 g 100 mL/hr over 30 Minutes Intravenous Every 24 hours 10/26/19 1242 10/26/19 1339   10/26/19 1400  nafcillin 12 g in dextrose 5 % 500 mL IVPB  Status:  Discontinued        12 g 20.8 mL/hr over 24 Hours Intravenous Every 24 hours 10/26/19 1337 10/26/19 1416   10/26/19 1200  nafcillin 2 g in sodium chloride 0.9 % 100 mL IVPB  Status:  Discontinued        2 g 200 mL/hr over 30 Minutes Intravenous Every 4 hours 10/26/19 1010 10/26/19 1238   10/12/19 2200  cefTRIAXone (ROCEPHIN) 2 g in sodium chloride 0.9 % 100 mL IVPB  Status:  Discontinued        2 g 200 mL/hr over 30 Minutes Intravenous Every 24 hours 10/12/19 0441 10/12/19 0921   10/12/19 0930  ceFAZolin (ANCEF) IVPB 2g/100 mL premix  Status:  Discontinued        2 g 200 mL/hr over 30 Minutes Intravenous Every 8 hours  10/12/19 0921 10/26/19 1010   10/11/19 1000  remdesivir 100 mg in sodium  chloride 0.9 % 100 mL IVPB       "Followed by" Linked Group Details   100 mg 200 mL/hr over 30 Minutes Intravenous Daily 10/10/19 0050 10/14/19 0956   10/11/19 0800  vancomycin (VANCOCIN) IVPB 1000 mg/200 mL premix  Status:  Discontinued        1,000 mg 200 mL/hr over 60 Minutes Intravenous Every 8 hours 10/10/19 2239 10/12/19 0921   10/10/19 2230  vancomycin (VANCOCIN) IVPB 1000 mg/200 mL premix        1,000 mg 200 mL/hr over 60 Minutes Intravenous Every 1 hr x 2 10/10/19 2208 10/11/19 0052   10/10/19 2215  cefTRIAXone (ROCEPHIN) 2 g in sodium chloride 0.9 % 100 mL IVPB  Status:  Discontinued        2 g 200 mL/hr over 30 Minutes Intravenous Every 24 hours 10/10/19 2205 10/12/19 0441   10/10/19 0100  remdesivir 100 mg in sodium chloride 0.9 % 100 mL IVPB       "Followed by" Linked Group Details   100 mg 200 mL/hr over 30 Minutes Intravenous Every 30 min 10/10/19 0050 10/10/19 0332        Susa Raring M.D on 11/07/2019 at 10:08 AM  To page go to [email or web address] -  [facility] -  Office  [phone]   See all Orders from today for further details    Objective:   Vitals:   11/06/19 1456 11/06/19 2001 11/07/19 0402 11/07/19 0500  BP: 133/80 135/76 114/72   Pulse: 92 100 95   Resp: 19 18 18    Temp: 98.4 F (36.9 C) 98.9 F (37.2 C) 98.3 F (36.8 C)   TempSrc: Oral Oral    SpO2: 96% 94% 94%   Weight:    115.5 kg  Height:        Wt Readings from Last 3 Encounters:  11/07/19 115.5 kg  09/11/19 111.1 kg  05/12/17 (!) 145.2 kg     Intake/Output Summary (Last 24 hours) at 11/07/2019 1008 Last data filed at 11/07/2019 0900 Gross per 24 hour  Intake 1793.27 ml  Output 1975 ml  Net -181.73 ml     Physical Exam  Awake Alert, No new F.N deficits, chronic bilateral rotator cuff injury induced bilateral upper extremity weakness in the deltoid area, per patient this is old and unchanged,  chronic foot wounds on both feet under bandage [place].AT,PERRAL Supple Neck,No  JVD, No cervical lymphadenopathy appriciated.  Symmetrical Chest wall movement, Good air movement bilaterally, CTAB RRR,No Gallops, Rubs or new Murmurs, No Parasternal Heave +ve B.Sounds, Abd Soft, No tenderness, No organomegaly appriciated, No rebound - guarding or rigidity.       Data Review:    CBC Recent Labs  Lab 11/02/19 0342 11/03/19 0356 11/05/19 0356 11/06/19 0339 11/07/19 0350  WBC 8.0 8.6 9.6 9.7 8.1  HGB 9.7* 9.6* 10.1* 10.2* 9.4*  HCT 30.5* 30.2* 30.9* 32.3* 29.0*  PLT 322 328 353 354 305  MCV 93.8 93.8 92.8 94.4 92.9  MCH 29.8 29.8 30.3 29.8 30.1  MCHC 31.8 31.8 32.7 31.6 32.4  RDW 12.9 13.1 13.2 13.4 13.6  LYMPHSABS 2.6 2.6 3.0 3.1 2.5  MONOABS 0.6 0.7 0.8 0.7 0.8  EOSABS 0.4 0.4 0.5 0.5 0.4  BASOSABS 0.0 0.1 0.0 0.0 0.0    Recent Labs  Lab 11/02/19 0342 11/02/19 0342 11/03/19 0356 11/04/19 1347 11/05/19 0356 11/05/19 0357 11/06/19 0339 11/07/19 0350  NA  135  --  136  --  135  --  134* 136  K 3.8  --  3.6  --  4.0  --  4.0 3.7  CL 98  --  102  --  100  --  98 101  CO2 25  --  24  --  25  --  26 25  GLUCOSE 167*  --  105*  --  155*  --  163* 141*  BUN 5*  --  <5*  --  5*  --  <5* <5*  CREATININE 0.74  --  0.70  --  0.76  --  0.78 0.75  CALCIUM 8.7*  --  8.8*  --  8.9  --  8.9 8.6*  AST 17  --  17  --  25  --  21 17  ALT 17  --  17  --  17  --  18 17  ALKPHOS 92  --  85  --  84  --  89 80  BILITOT 0.7  --  0.8  --  0.7  --  0.7 0.8  ALBUMIN 2.1*  --  2.1*  --  2.2*  --  2.3* 2.1*  MG 1.8   < > 1.4* 1.8 1.6*  --  1.8 1.5*  CRP  --   --   --   --  9.8*  --  11.4* 9.8*  DDIMER  --   --   --   --  5.95*  --   --   --   PROCALCITON  --   --   --   --  <0.10  --   --   --   BNP  --   --   --   --   --  36.0  --   --    < > = values in this interval not displayed.    ------------------------------------------------------------------------------------------------------------------ No results for input(s): CHOL, HDL, LDLCALC, TRIG, CHOLHDL,  LDLDIRECT in the last 72 hours.  Lab Results  Component Value Date   HGBA1C 11.3 (H) 10/26/2019   ------------------------------------------------------------------------------------------------------------------ No results for input(s): TSH, T4TOTAL, T3FREE, THYROIDAB in the last 72 hours.  Invalid input(s): FREET3  Cardiac Enzymes No results for input(s): CKMB, TROPONINI, MYOGLOBIN in the last 168 hours.  Invalid input(s): CK ------------------------------------------------------------------------------------------------------------------    Component Value Date/Time   BNP 36.0 11/05/2019 0357    Micro Results No results found for this or any previous visit (from the past 240 hour(s)).  Radiology Reports CT HEAD WO CONTRAST  Result Date: 10/19/2019 CLINICAL DATA:  Deliriums.  Mental status change. EXAM: CT HEAD WITHOUT CONTRAST TECHNIQUE: Contiguous axial images were obtained from the base of the skull through the vertex without intravenous contrast. COMPARISON:  None. FINDINGS: Brain: Area subcortical low-density involving the posterior left frontal lobe at the convexity, series 3, image 25 and series 5, image 49, likely encephalomalacia and sequela of remote infarct, however nonspecific. No hemorrhage. No evidence of acute ischemia. No hydrocephalus, midline shift or mass effect. No subdural or extra-axial collection. Vascular: Atherosclerosis of skullbase vasculature without hyperdense vessel or abnormal calcification. Skull: No fracture or focal lesion. Sinuses/Orbits: Nasogastric tube in place. Fluid levels in the sphenoid sinuses and right maxillary sinus with scattered opacification of ethmoid air cells may be related to intubation. Opacification of the right greater than left mastoid air cells. Other: None. IMPRESSION: 1. No acute intracranial abnormality. 2. Area of subcortical low-density involving the posterior left frontal lobe at the convexity,  likely encephalomalacia and  sequela of remote infarct. There are no prior exams for comparison to establish chronicity. MRI could be considered for further evaluation based on clinical concern. 3. Paranasal sinus fluid levels on opacification of the right greater than left mastoid air cells, possibly related to intubation. Electronically Signed   By: Narda Rutherford M.D.   On: 10/19/2019 15:30   CT ANGIO CHEST PE W OR WO CONTRAST  Result Date: 10/23/2019 CLINICAL DATA:  COVID pneumonia, acute hypoxic respiratory failure, positive D-dimer EXAM: CT ANGIOGRAPHY CHEST WITH CONTRAST TECHNIQUE: Multidetector CT imaging of the chest was performed using the standard protocol during bolus administration of intravenous contrast. Multiplanar CT image reconstructions and MIPs were obtained to evaluate the vascular anatomy. CONTRAST:  75mL OMNIPAQUE IOHEXOL 350 MG/ML SOLN COMPARISON:  None. FINDINGS: Cardiovascular: Satisfactory opacification of the pulmonary arteries to the segmental level. No evidence of pulmonary embolism. The central pulmonary arteries are of normal caliber. There is moderate coronary artery calcification. Normal heart size. No pericardial effusion. The thoracic aorta is unremarkable save for bovine arch anatomy. Right upper extremity PICC line tip is seen within the superior vena cava. Mediastinum/Nodes: No enlarged mediastinal, hilar, or axillary lymph nodes. Thyroid gland, trachea, and esophagus demonstrate no significant findings. Nasoenteric feeding tube extends into the upper abdomen beyond the margin of the examination. Lungs/Pleura: Lung volumes are small. Evaluation is limited by motion artifact, however, multifocal pulmonary infiltrates are identified demonstrating a peripheral and basal predominance, likely infectious or inflammatory in the acute setting. No central obstructing lesion. No pneumothorax or pleural effusion. No superimposed interstitial edema. Upper Abdomen: No acute abnormality. Musculoskeletal: No chest  wall abnormality. No acute or significant osseous findings. Review of the MIP images confirms the above findings. IMPRESSION: 1. No evidence of pulmonary embolism. 2. Multifocal pulmonary infiltrates are identified demonstrating a peripheral and basal predominance, likely infectious or inflammatory in the acute setting. 3. Moderate coronary artery calcification. Electronically Signed   By: Helyn Numbers MD   On: 10/23/2019 16:25   MR ANGIO HEAD WO CONTRAST  Result Date: 10/29/2019 CLINICAL DATA:  Neuro deficit with stroke suspected EXAM: MRA HEAD WITHOUT CONTRAST TECHNIQUE: Angiographic images of the Circle of Willis were obtained using MRA technique without intravenous contrast. COMPARISON:  Four days ago FINDINGS: History of bacteremia and embolic infarction. No vessel beading, stenosis, or pseudoaneurysm. IMPRESSION: Negative intracranial MRA. Electronically Signed   By: Marnee Spring M.D.   On: 10/29/2019 12:04   MR BRAIN WO CONTRAST  Result Date: 10/25/2019 CLINICAL DATA:  Neuro deficit, acute, stroke suspected. Additional history provided: COVID positive. Additional history obtained from electronic MEDICAL RECORD NUMBERSepsis with MSSA and group B strep bacteremia. EXAM: MRI HEAD WITHOUT CONTRAST TECHNIQUE: Multiplanar, multiecho pulse sequences of the brain and surrounding structures were obtained without intravenous contrast. COMPARISON:  Head CT 10/19/2019. FINDINGS: Brain: The examination is limited by poor signal on several sequences as well as motion degradation. Most notably, there is moderate motion degradation of the axial T2 weighted sequence, severe motion degradation of the axial SWI sequence and moderate motion degradation of the coronal T2 weighted sequence. Cerebral volume is normal for age. There is a 17 mm focus of restricted diffusion and corresponding T2/FLAIR hyperintensity within the subcortical left frontoparietal lobes (for instance as seen on series 2, image 39) (series 3, image  17). Additional mild scattered T2/FLAIR hyperintensity within the cerebral white matter and pons is nonspecific, but consistent with chronic small vessel ischemic disease. Severe motion degradation of the axial  SWI sequence precludes adequate evaluation for intracranial blood products. No extra-axial fluid collection. No midline shift. Vascular: Expected proximal arterial flow voids. Skull and upper cervical spine: No focal marrow lesion is identified within described limitations. Sinuses/Orbits: Visualized orbits show no acute finding. Air-fluid levels within the sphenoid and right maxillary sinuses. Partial opacification of right ethmoid air cells. Bilateral mastoid effusions. These results will be called to the ordering clinician or representative by the Radiologist Assistant, and communication documented in the PACS or [employer]. IMPRESSION: Examination limited by poor signal on several sequences as well as motion degradation, as described. 17 mm focus of restricted diffusion and T2/FLAIR hyperintensity within the subcortical left frontoparietal lobes. This finding is nonspecific, but given the patient's history, the primary differential considerations are acute/early subacute infarct versus cerebritis. Consider contrast-enhanced MR imaging of the brain for further evaluation of this lesion. Mild chronic small vessel ischemic disease. Paranasal sinus disease with air-fluid levels. Bilateral mastoid effusions. Electronically Signed   By: Jackey Loge DO   On: 10/25/2019 15:16   MR BRAIN W CONTRAST  Result Date: 10/25/2019 CLINICAL DATA:  Brain mass follow-up EXAM: MRI HEAD WITH CONTRAST TECHNIQUE: Multiplanar, multiecho pulse sequences of the brain and surrounding structures were obtained with intravenous contrast. CONTRAST:  25mL GADAVIST GADOBUTROL 1 MMOL/ML IV SOLN COMPARISON:  Brain MRI without contrast 10/25/2019 FINDINGS: Brain: There is a peripherally enhancing lesion at the base of the left  precentral gyrus that measures 0.9 x 0.7 x 1.8 cm. There are no other areas of abnormal contrast enhancement. 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 Electronically Signed   By: Deatra Robinson M.D.   On: 10/25/2019 22:47   MR FOOT LEFT WO CONTRAST  Result Date: 10/25/2019 CLINICAL DATA:  Bacteremia left foot pain and diabetic EXAM: MRI OF THE LEFT FOOT WITHOUT CONTRAST TECHNIQUE: Multiplanar, multisequence MR imaging of the left was performed. No intravenous contrast was administered. COMPARISON:  October 23, 2019 FINDINGS: Bones/Joint/Cartilage Mildly angulated nondisplaced fractures of the midshaft of the fourth and fifth metatarsals are noted. There is periosteal reaction seen along the medial margins of the metatarsal shafts. Increased heterogeneous T2 hyperintense signal with subtle T1 hypointensity seen at the base of the fourth and fifth metatarsals. No other osseous marrow signal abnormality is seen. There is no large knee joint effusions noted. The articular surfaces appear to be maintained. Ligaments The Lisfranc ligament is intact. Muscles and Tendons Increased feathery signal with atrophy is seen throughout the muscles of the forefoot. There is a small amount of fluid seen surrounding the posterior tibialis tendon. The remainder of the flexor and extensor tendons are intact. Soft tissues Lateral plantar surface of the forefoot overlying the fifth metatarsal base there is a focal area of ulceration measuring approximately 8 mm in transverse dimension. A fluid-filled sinus tract is seen extending to the overlying osseous surface. There is a multilocular fluid collection which extends around the dorsal surface of the fifth metatarsal measuring approximately 5.4 x 0.9 x 3.4 cm. There is extensive dorsal and lateral subcutaneous edema and skin thickening. IMPRESSION: Superficial area of ulceration over the  plantar lateral aspect of the fifth metatarsal base with a fluid-filled sinus tract and loculated probable abscess extending over the dorsal surface of the fifth metatarsal measuring 5.4 x 0.9 by is a 3.4 cm. Incomplete mildly angulated fourth and fifth metatarsal shaft fractures with periosteal reaction Findings which could be suggestive of reactive  marrow versus early osteomyelitis involving the base of the fourth and fifth metatarsals. Electronically Signed   By: Jonna Clark M.D.   On: 10/25/2019 19:06   DG Chest Port 1 View  Result Date: 10/26/2019 CLINICAL DATA:  Short of breath.  COVID positive EXAM: PORTABLE CHEST 1 VIEW COMPARISON:  10/25/2019 FINDINGS: Hypoventilation with decreased lung volume. Mild bibasilar airspace disease unchanged. No new infiltrate or effusion. Right arm PICC tip in the SVC unchanged. IMPRESSION: Hypoventilation with bibasilar mild airspace disease unchanged. Electronically Signed   By: Marlan Palau M.D.   On: 10/26/2019 16:10   DG Chest Port 1 View  Result Date: 10/25/2019 CLINICAL DATA:  Shortness of breath. EXAM: PORTABLE CHEST 1 VIEW COMPARISON:  October 16, 2019. FINDINGS: The heart size and mediastinal contours are within normal limits. Hypoinflation of the lungs is noted with mild bibasilar subsegmental atelectasis. Endotracheal tube has been removed. Right-sided PICC line is unchanged in position. The visualized skeletal structures are unremarkable. IMPRESSION: Hypoinflation of the lungs with mild bibasilar subsegmental atelectasis. Electronically Signed   By: Lupita Raider M.D.   On: 10/25/2019 08:37   DG CHEST PORT 1 VIEW  Result Date: 10/16/2019 CLINICAL DATA:  Status post intubation. EXAM: PORTABLE CHEST 1 VIEW COMPARISON:  10/14/2019 FINDINGS: ET tube tip is above the carina. There is a right arm PICC line with tip at the cavoatrial junction. Lung volumes are low. Similar appearance of bilateral pulmonary opacities compatible with multifocal pneumonia.  IMPRESSION: 1. Stable support apparatus. 2. Persistent bilateral pulmonary opacities compatible with multifocal pneumonia. Electronically Signed   By: Signa Kell M.D.   On: 10/16/2019 06:14   DG Chest Port 1 View  Result Date: 10/15/2019 CLINICAL DATA:  49 year old male with history of COVID infection. EXAM: PORTABLE CHEST 1 VIEW COMPARISON:  Chest x-ray 10/14/2019. FINDINGS: An endotracheal tube is in place with tip 3.6 cm above the carina. A nasogastric tube is seen extending into the stomach, however, the tip of the nasogastric tube extends below the lower margin of the image. There is a right upper extremity PICC with tip terminating in the right atrium. Lung volumes are low. Widespread patchy areas of interstitial prominence an ill-defined airspace disease again noted throughout the lungs bilaterally. Overall, aeration is essentially unchanged. No pleural effusions. No evidence of pulmonary edema. No pneumothorax. Heart size is normal. Upper mediastinal contours are within normal limits. IMPRESSION: 1. Support apparatus, as above. 2. The appearance the chest is compatible with multilobar pneumonia from reported COVID infection. Electronically Signed   By: Trudie Reed M.D.   On: 10/15/2019 05:39   DG Chest Port 1 View  Result Date: 10/14/2019 CLINICAL DATA:  COVID-19, ARDS EXAM: PORTABLE CHEST 1 VIEW COMPARISON:  10/13/2019 FINDINGS: Single frontal view of the chest demonstrates stable position of the endotracheal tube and enteric catheter. There is a right-sided PICC tip overlying superior vena cava. The cardiac silhouette is stable. Interstitial and ground-glass opacities are seen throughout the lungs bilaterally, unchanged. Lung volumes are diminished. No effusion or pneumothorax. IMPRESSION: 1. 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 2. Stable support devices. Electronically Signed   By: Sharlet Salina M.D.   On: 10/14/2019 15:57   DG Chest Port 1 View  Result Date: 10/13/2019 CLINICAL DATA:  Acute respiratory failure with hypoxemia, COVID positive, asthma, diabetes mellitus, hypertension, CHF EXAM: PORTABLE CHEST 1 VIEW COMPARISON:  Portable exam 1021 hours compared to 10/12/2019 FINDINGS:  Tip of endotracheal tube projects 3.7 cm above carina. Nasogastric tube extends into stomach. RIGHT arm PICC line tip projects over cavoatrial junction. Stable heart size mediastinal contours for technique. Patchy BILATERAL airspace infiltrates consistent with multifocal pneumonia, minimally improved. No pleural effusion or pneumothorax. IMPRESSION: Minimally improved pulmonary infiltrates. Electronically Signed   By: Ulyses Southward M.D.   On: 10/13/2019 10:37   DG CHEST PORT 1 VIEW  Result Date: 10/12/2019 CLINICAL DATA:  Intubation EXAM: PORTABLE CHEST 1 VIEW COMPARISON:  Earlier today FINDINGS: New endotracheal tube with tip at the clavicular heads, measuring 3 cm above the carina. Low volume chest with severe airspace disease. No visible effusion or air leak. Stable heart size which is distorted by positioning and volumes. IMPRESSION: 1. Unremarkable positioning of the endotracheal tube. 2. Stable extensive airspace disease with low lung volumes. Electronically Signed   By: Marnee Spring M.D.   On: 10/12/2019 07:13   DG CHEST PORT 1 VIEW  Result Date: 10/12/2019 CLINICAL DATA:  Shortness of breath EXAM: PORTABLE CHEST 1 VIEW COMPARISON:  Three days ago FINDINGS: Very low volume chest with diffuse and patchy pulmonary opacity. Vascular pedicle widening and prominent heart size largely related to technique. No visible effusion or pneumothorax. IMPRESSION: Worsening multifocal pneumonia.  Lung volumes remain very low. Electronically Signed   By: Marnee Spring M.D.   On: 10/12/2019 04:07   DG Chest Portable 1 View  Result Date: 10/09/2019 CLINICAL DATA:  Cough, fever, shortness of breath EXAM: PORTABLE  CHEST 1 VIEW COMPARISON:  None. FINDINGS: Single frontal view of the chest demonstrates unremarkable cardiac silhouette. The lung volumes are diminished, with patchy bilateral areas of faint ground-glass airspace disease consistent with multifocal pneumonia. No effusion or pneumothorax. IMPRESSION: 1. Patchy bilateral ground-glass airspace disease consistent with multifocal pneumonia. Electronically Signed   By: Sharlet Salina M.D.   On: 10/09/2019 19:32   DG Abd Portable 1V  Result Date: 10/20/2019 CLINICAL DATA:  GI problem. EXAM: PORTABLE ABDOMEN - 1 VIEW COMPARISON:  10/16/2019 FINDINGS: Feeding tube tip is in the distal stomach or proximal duodenum. Mild gaseous distention of the stomach. Gas throughout nondistended large and small bowel. No evidence of bowel obstruction, organomegaly or free air. IMPRESSION: Feeding tube tip in the distal stomach or proximal duodenum. No evidence of obstruction or free air. Electronically Signed   By: Charlett Nose M.D.   On: 10/20/2019 21:28   DG Abd Portable 1V  Result Date: 10/16/2019 CLINICAL DATA:  Status post OG tube placement EXAM: PORTABLE ABDOMEN - 1 VIEW COMPARISON:  10/16/2019 FINDINGS: The enteric tube tip projects over the distal body of stomach in the side port is below the level of the GE junction. A few air-filled loops of small bowel are noted within the upper abdomen which measure up to 2.8 cm. IMPRESSION: Satisfactory position of enteric tube. Electronically Signed   By: Signa Kell M.D.   On: 10/16/2019 06:52   DG Abd Portable 1V  Result Date: 10/12/2019 CLINICAL DATA:  Onychogryphosis EXAM: PORTABLE ABDOMEN - 1 VIEW COMPARISON:  Portable exam 0830 hours without priors for comparison FINDINGS: Tip of nasogastric tube projects over distal antrum. Nonobstructive bowel gas pattern. No bowel dilatation or bowel wall thickening. Patchy bibasilar infiltrates. No acute osseous findings. IMPRESSION: Tip of nasogastric tube projects over distal gastric  antrum. Nonobstructive bowel gas pattern. Bibasilar pulmonary infiltrates. Electronically Signed   By: Ulyses Southward M.D.   On: 10/12/2019 08:51   DG Foot 2 Views Left  Result  Date: 10/23/2019 CLINICAL DATA:  Bacteremia, LEFT foot pain and diabetic. EXAM: LEFT FOOT - 2 VIEW COMPARISON:  10/13/2019 and prior radiographs FINDINGS: Mildly angulated fractures of the 4th and 5th metatarsals are again noted with fracture lines still evident. Healing changes along these fractures are noted. Overlying soft tissue swelling is noted. No new fracture, subluxation or dislocation noted. No definite radiographic evidence of acute osteomyelitis noted. IMPRESSION: Mildly angulated healing fractures of the 4th and 5th metatarsals with soft tissue swelling again noted but without definite radiographic evidence of acute osteomyelitis. Consider MRI for further evaluation as clinically indicated. Electronically Signed   By: Harmon Pier M.D.   On: 10/23/2019 13:40   DG Foot 2 Views Left  Result Date: 10/13/2019 CLINICAL DATA:  49 year old male with diabetic foot. EXAM: LEFT FOOT - 2 VIEW COMPARISON:  Left foot radiograph dated 09/11/2019. FINDINGS: Evaluation is limited due to positioning. Fractures of the midportion of the fourth and fifth metacarpal with interval progression of bridging callus formation. No new fracture identified. There is no dislocation. There is diffuse soft tissue swelling of the foot. No radiopaque foreign object or soft tissue gas. IMPRESSION: 1. Healing fractures of the fourth and fifth metacarpal. 2. Diffuse soft tissue swelling. No radiopaque foreign object or soft tissue gas. Electronically Signed   By: Elgie Collard M.D.   On: 10/13/2019 16:49   EEG adult  Result Date: 10/27/2019 Charlsie Quest, MD     10/28/2019 10:13 AM Patient Name: AYDYN TESTERMAN MRN: [id number] Epilepsy Attending: Charlsie Quest Referring Physician/Provider: Dr. [name] Date: 10/27/2019 Duration: 22.22 minutes  Patient history: 49 year old male with altered mental status. EEG evaluate for seizures. Level of alertness: Awake, asleep AEDs during EEG study: Keppra Technical aspects: This EEG study was done with scalp electrodes positioned according to the 10-20 International system of electrode placement. Electrical activity was acquired at a sampling rate of 500Hz  and reviewed with a high frequency filter of 70Hz  and a low frequency filter of 1Hz . EEG data were recorded continuously and digitally stored. Description: The posterior dominant rhythm consists of 8 Hz activity of moderate voltage (25-35 uV) seen predominantly in posterior head regions, symmetric and reactive to eye opening and eye closing. Sleep was characterized by vertex waves, sleep spindles (12 to 14 Hz), maximal frontocentral region. Frequent runs of sharp waves were seen in frontocentral region, maximal vertex lasting about 5 to 6 seconds consistent with brief ictal-interictal rhythmic discharges. Continuous generalized 5 to 6 Hz theta slowing was also noted. Hyperventilation and photic stimulation were not performed.   ABNORMALITY -Continuous slow, generalized -Brief ictal-interictal rhythmic discharges, bilateral frontocentral region, maximal vertex IMPRESSION: This study showed evidence of potential epileptogenicity arising from bilateral frontocentral, maximal vertex region. The sharp waves are at times rhythmic consistent with brief ictal-interictal rhythmic discharges. Additionally, there is evidence of mild diffuse encephalopathy, nonspecific etiology. Priyanka [name]   Overnight EEG with video  Result Date: 10/28/2019 Charlsie Quest, MD     10/29/2019  8:27 AM Patient Name: KAYLUB DETIENNE MRN: [id number] Epilepsy Attending: Charlsie Quest Referring Physician/Provider: Dr. [name] Duration: 10/27/2019 1145 to 10/28/2019 1145  Patient history: 49 year old male with altered mental status. EEG evaluate for seizures.  Level of alertness:  Awake, asleep AEDs during EEG study: Keppra  Technical aspects: This EEG study was done with scalp electrodes positioned according to the 10-20 International system of electrode placement. Electrical activity was acquired at a sampling rate of 500Hz  and reviewed with a  high frequency filter of 70Hz  and a low frequency filter of 1Hz . EEG data were recorded continuously and digitally stored.  Description: The posterior dominant rhythm consists of 8 Hz activity of moderate voltage (25-35 uV) seen predominantly in posterior head regions, symmetric and reactive to eye opening and eye closing. Sleep was characterized by vertex waves, sleep spindles (12 to 14 Hz), maximal frontocentral region. Initially, frequent runs of sharp waves were seen in frontocentral region, maximal vertex lasting about 5 to 6 seconds consistent with brief ictal-interictal rhythmic discharges. Continuous generalized 5 to 6 Hz theta slowing as well as intermittent generalized rhythmic 2-3hz  delta slowing  was also noted. Hyperventilation and photic stimulation were not performed.    ABNORMALITY -Continuous slow, generalized - Intermittent rhythmic delta slowing, generalized -Brief ictal-interictal rhythmic discharges, bilateral frontocentral region, maximal vertex  IMPRESSION: This study initially showed evidence of potential epileptogenicity arising from bilateral frontocentral, maximal vertex region. The sharp waves are at times rhythmic consistent with brief ictal-interictal rhythmic discharges. Additionally, there is evidence of mild diffuse encephalopathy, nonspecific etiology. No seizures were seen during thi study. EEG appears to be improving compared to previous day.  Charlsie Quest   ECHOCARDIOGRAM COMPLETE  Result Date: 10/12/2019    ECHOCARDIOGRAM REPORT   Patient Name:   OBE AHLERS Date of Exam: 10/12/2019 Medical Rec #:  [id number]      Height:       74.0 in Accession #:    [id number]     Weight:       245.0 lb Date of  Birth:  1970/03/12      BSA:          2.369 m Patient Age:    49 years       BP:           111/74 mmHg Patient Gender: M              HR:           75 bpm. Exam Location:  Inpatient Procedure: 2D Echo Indications:    CHF 428                  & Bacteremia 790.7  History:        Patient has no prior history of Echocardiogram examinations.                 CHF; Risk Factors:Hypertension and Diabetes. Covid +.  Sonographer:    Celene Skeen RDCS (AE) Referring Phys: [id number] Clarene [facility]  Sonographer Comments: Echo performed with patient supine and on artificial respirator. Image acquisition challenging due to respiratory motion and Image acquisition challenging due to patient body habitus. restricted mobility IMPRESSIONS  1. Extremely poor acoustic windows. LVEF appears to be depressed with diffuse hypokinesis, worse in the mid/distal inferior/inferoseptal walls. . Left ventricular ejection fraction, by estimation, is 30 to 35%. The left ventricle has moderately decreased function. The left ventricular internal cavity size was mildly dilated. Left ventricular diastolic parameters are indeterminate.  2. Right ventricular systolic function is moderately reduced. The right ventricular size is mildly enlarged.  3. The mitral valve is normal in structure. Trivial mitral valve regurgitation.  4. The aortic valve is normal in structure. Aortic valve regurgitation is not visualized. FINDINGS  Left Ventricle: Extremely poor acoustic windows. LVEF appears to be depressed with diffuse hypokinesis, worse in the mid/distal inferior/inferoseptal walls. Left ventricular ejection fraction, by estimation, is 30 to 35%. The left ventricle has moderately decreased function. The left  ventricle demonstrates regional wall motion abnormalities. The left ventricular internal cavity size was mildly dilated. There is no left ventricular hypertrophy. Left ventricular diastolic parameters are indeterminate. Right Ventricle: The right ventricular  size is mildly enlarged. Right vetricular wall thickness was not assessed. Right ventricular systolic function is moderately reduced. Left Atrium: Left atrial size was normal in size. Right Atrium: Right atrial size was normal in size. Pericardium: There is no evidence of pericardial effusion. Mitral Valve: The mitral valve is normal in structure. Trivial mitral valve regurgitation. Tricuspid Valve: The tricuspid valve is normal in structure. Tricuspid valve regurgitation is trivial. Aortic Valve: The aortic valve is normal in structure. Aortic valve regurgitation is not visualized. Pulmonic Valve: The pulmonic valve was grossly normal. Pulmonic valve regurgitation is trivial. Aorta: The aortic root is normal in size and structure. IAS/Shunts: The interatrial septum was not assessed.  LEFT VENTRICLE PLAX 2D LVIDd:         5.60 cm  Diastology LVIDs:         3.90 cm  LV e' lateral:   8.81 cm/s LV PW:         1.40 cm  LV E/e' lateral: 5.7 LV IVS:        1.10 cm  LV e' medial:    5.87 cm/s LVOT diam:     2.40 cm  LV E/e' medial:  8.5 LV SV:         64 LV SV Index:   27 LVOT Area:     4.52 cm  RIGHT VENTRICLE RV S prime:     9.79 cm/s TAPSE (M-mode): 1.3 cm LEFT ATRIUM             Index       RIGHT ATRIUM           Index LA diam:        3.10 cm 1.31 cm/m  RA Area:     18.80 cm LA Vol (A2C):   62.9 ml 26.55 ml/m RA Volume:   51.80 ml  21.86 ml/m LA Vol (A4C):   40.8 ml 17.22 ml/m LA Biplane Vol: 50.8 ml 21.44 ml/m  AORTIC VALVE LVOT Vmax:   69.80 cm/s LVOT Vmean:  49.400 cm/s LVOT VTI:    0.142 m  AORTA Ao Root diam: 3.60 cm MITRAL VALVE MV Area (PHT): 2.34 cm    SHUNTS MV Decel Time: 324 msec    Systemic VTI:  0.14 m MV E velocity: 50.10 cm/s  Systemic Diam: 2.40 cm MV A velocity: 71.60 cm/s MV E/A ratio:  0.70 Dietrich Pates MD Electronically signed by Dietrich Pates MD Signature Date/Time: 10/12/2019/1:58:41 PM    Final    ECHO TEE  Result Date: 10/24/2019    TRANSESOPHOGEAL ECHO REPORT   Patient Name:   [name] Date of Exam: 10/24/2019 Medical Rec #:  [id number]      Height:       74.0 in Accession #:    [id number]     Weight:       247.4 lb Date of Birth:  09-21-1970      BSA:          2.379 m Patient Age:    49 years       BP:           120/80 mmHg Patient Gender: M              HR:           100  bpm. Exam Location:  Inpatient Procedure: Transesophageal Echo Indications:    Emobli/Bacteremia  History:        Patient has prior history of Echocardiogram examinations.  Sonographer:    Ross Ludwig RDCS (AE) Referring Phys: [phone] HAO MENG PROCEDURE: The transesophogeal probe was passed without difficulty through the esophogus of the patient. Sedation performed by different physician. The patient's vital signs; including heart rate, blood pressure, and oxygen saturation; remained stable throughout the procedure. The patient developed no complications during the procedure. IMPRESSIONS  1. Left ventricular ejection fraction, by estimation, is 50 to 55%. The left ventricle has low normal function. The left ventricle has no regional wall motion abnormalities.  2. Right ventricular systolic function is normal. The right ventricular size is normal.  3. No left atrial/left atrial appendage thrombus was detected.  4. The mitral valve is normal in structure. Trivial mitral valve regurgitation. No evidence of mitral stenosis.  5. The aortic valve is normal in structure. Aortic valve regurgitation is not visualized. No aortic stenosis is present.  6. The inferior vena cava is normal in size with greater than 50% respiratory variability, suggesting right atrial pressure of 3 mmHg. Conclusion(s)/Recommendation(s): Normal biventricular function without evidence of hemodynamically significant valvular heart disease. FINDINGS  Left Ventricle: Left ventricular ejection fraction, by estimation, is 50 to 55%. The left ventricle has low normal function. The left ventricle has no regional wall motion abnormalities. The left ventricular  internal cavity size was normal in size. There is no left ventricular hypertrophy. Right Ventricle: The right ventricular size is normal. No increase in right ventricular wall thickness. Right ventricular systolic function is normal. Left Atrium: Left atrial size was normal in size. No left atrial/left atrial appendage thrombus was detected. Right Atrium: Right atrial size was normal in size. Pericardium: There is no evidence of pericardial effusion. Mitral Valve: The mitral valve is normal in structure. Normal mobility of the mitral valve leaflets. Trivial mitral valve regurgitation. No evidence of mitral valve stenosis. There is no evidence of mitral valve vegetation. Tricuspid Valve: The tricuspid valve is normal in structure. Tricuspid valve regurgitation is mild . No evidence of tricuspid stenosis. There is no evidence of tricuspid valve vegetation. Aortic Valve: The aortic valve is normal in structure. Aortic valve regurgitation is not visualized. No aortic stenosis is present. There is no evidence of aortic valve vegetation. Pulmonic Valve: The pulmonic valve was normal in structure. Pulmonic valve regurgitation is trivial. No evidence of pulmonic stenosis. Aorta: The aortic root is normal in size and structure. Venous: The inferior vena cava is normal in size with greater than 50% respiratory variability, suggesting right atrial pressure of 3 mmHg. IAS/Shunts: No atrial level shunt detected by color flow Doppler. [name] MD Electronically signed by [name] MD Signature Date/Time: 10/24/2019/3:28:45 PM    Final    VAS US CAROTID (at [facility] and [facility] only)  Result Date: 10/26/2019 Carotid Arterial Duplex Study Indications:       17mm focus of diffusion restriction in the left                    frontoparietal lobe concerning for acute/early subacute                    infarct vs cerebritis. Risk Factors:      Hypertension, Diabetes. Other Factors:     Covid-19, bacteremia. Comparison Study:  No prior  study on file Performing Technologist: Sherren Kerns RVS  Examination Guidelines: A complete  evaluation includes B-mode imaging, spectral Doppler, color Doppler, and power Doppler as needed of all accessible portions of each vessel. Bilateral testing is considered an integral part of a complete examination. Limited examinations for reoccurring indications may be performed as noted.  Right Carotid Findings: +----------+--------+--------+--------+------------------+--------+           PSV cm/sEDV cm/sStenosisPlaque DescriptionComments +----------+--------+--------+--------+------------------+--------+ CCA Prox  66      11                                         +----------+--------+--------+--------+------------------+--------+ CCA Distal71      18                                         +----------+--------+--------+--------+------------------+--------+ ICA Prox  58      19                                         +----------+--------+--------+--------+------------------+--------+ ICA Distal90      33                                         +----------+--------+--------+--------+------------------+--------+ ECA       114     16                                         +----------+--------+--------+--------+------------------+--------+ +----------+--------+-------+--------+-------------------+           PSV cm/sEDV cmsDescribeArm Pressure (mmHG) +----------+--------+-------+--------+-------------------+ [id number]                                        +----------+--------+-------+--------+-------------------+ +---------+--------+--+--------+--+ VertebralPSV cm/s38EDV cm/s11 +---------+--------+--+--------+--+  Left Carotid Findings: +----------+--------+--------+--------+------------------+--------+           PSV cm/sEDV cm/sStenosisPlaque DescriptionComments +----------+--------+--------+--------+------------------+--------+ CCA Prox  55      14                                          +----------+--------+--------+--------+------------------+--------+ CCA Distal65      20                                         +----------+--------+--------+--------+------------------+--------+ ICA Prox  68      20                                         +----------+--------+--------+--------+------------------+--------+ ICA Distal76      23                                         +----------+--------+--------+--------+------------------+--------+ ECA  77      3                                          +----------+--------+--------+--------+------------------+--------+ +----------+--------+--------+--------+-------------------+           PSV cm/sEDV cm/sDescribeArm Pressure (mmHG) +----------+--------+--------+--------+-------------------+ Subclavian88                                          +----------+--------+--------+--------+-------------------+ +---------+--------+--+--------+--+ VertebralPSV cm/s63EDV cm/s18 +---------+--------+--+--------+--+   Summary: Right Carotid: The extracranial vessels were near-normal with only minimal wall                thickening or plaque. Left Carotid: The extracranial vessels were near-normal with only minimal wall               thickening or plaque. Vertebrals:  Bilateral vertebral arteries demonstrate antegrade flow. Subclavians: Normal flow hemodynamics were seen in bilateral subclavian              arteries. *See table(s) above for measurements and observations.  Electronically signed by Delia Heady MD on 10/26/2019 at 12:37:48 PM.    Final    VAS [place] LOWER EXTREMITY ARTERIAL DUPLEX  Result Date: 10/26/2019 LOWER EXTREMITY ARTERIAL DUPLEX STUDY Indications: Ulceration, and Osteomyelitis. High Risk Factors: Hypertension, Diabetes. Other Factors: Covid-19. Charcot foot.  Current ABI: N/A Comparison Study: No prior study Performing Technologist: Sherren Kerns RVS  Examination  Guidelines: A complete evaluation includes B-mode imaging, spectral Doppler, color Doppler, and power Doppler as needed of all accessible portions of each vessel. Bilateral testing is considered an integral part of a complete examination. Limited examinations for reoccurring indications may be performed as noted.  +----------+--------+-----+--------+---------+--------+ LEFT      PSV cm/sRatioStenosisWaveform Comments +----------+--------+-----+--------+---------+--------+ CFA Prox  123                  triphasic         +----------+--------+-----+--------+---------+--------+ DFA       51                   triphasic         +----------+--------+-----+--------+---------+--------+ SFA Prox  97                   triphasic         +----------+--------+-----+--------+---------+--------+ SFA Mid   88                   triphasic         +----------+--------+-----+--------+---------+--------+ SFA Distal81                   triphasic         +----------+--------+-----+--------+---------+--------+ POP Prox  60                   triphasic         +----------+--------+-----+--------+---------+--------+ POP Distal86                   triphasic         +----------+--------+-----+--------+---------+--------+ ATA Distal110                  triphasic         +----------+--------+-----+--------+---------+--------+ PTA Prox  34  triphasic         +----------+--------+-----+--------+---------+--------+ PTA Mid   29                   triphasic         +----------+--------+-----+--------+---------+--------+ PTA Distal125                  triphasic         +----------+--------+-----+--------+---------+--------+  Summary: Left: Normal waveforms and adequate flow noted.  See table(s) above for measurements and observations. Electronically signed by Lemar Livings MD on 10/26/2019 at 4:50:14 PM.    Final    VAS [place] LOWER EXTREMITY ARTERIAL  DUPLEX  Result Date: 10/19/2019 LOWER EXTREMITY ARTERIAL DUPLEX STUDY Indications: Cold leg, Covid-19.  Current ABI: N/A Comparison Study: No prior study Performing Technologist: Sherren Kerns RVS  Examination Guidelines: A complete evaluation includes B-mode imaging, spectral Doppler, color Doppler, and power Doppler as needed of all accessible portions of each vessel. Bilateral testing is considered an integral part of a complete examination. Limited examinations for reoccurring indications may be performed as noted.  +-----------+--------+-----+--------+---------+--------+ RIGHT      PSV cm/sRatioStenosisWaveform Comments +-----------+--------+-----+--------+---------+--------+ CFA Prox   67                   triphasic         +-----------+--------+-----+--------+---------+--------+ DFA        60                   triphasic         +-----------+--------+-----+--------+---------+--------+ SFA Prox   68                   triphasic         +-----------+--------+-----+--------+---------+--------+ SFA Mid    62                   triphasic         +-----------+--------+-----+--------+---------+--------+ SFA Distal 69                   triphasic         +-----------+--------+-----+--------+---------+--------+ POP Prox   47                   triphasic         +-----------+--------+-----+--------+---------+--------+ POP Distal 42                   triphasic         +-----------+--------+-----+--------+---------+--------+ ATA Distal 15                   triphasic         +-----------+--------+-----+--------+---------+--------+ PTA Prox   31                   triphasic         +-----------+--------+-----+--------+---------+--------+ PTA Mid    38                   triphasic         +-----------+--------+-----+--------+---------+--------+ PTA Distal 38                   triphasic          +-----------+--------+-----+--------+---------+--------+ PERO Distal36                   triphasic         +-----------+--------+-----+--------+---------+--------+  Summary: Right: Normal examination. No evidence of arterial  occlusive disease. Normal waveforms, no stenosis.  See table(s) above for measurements and observations. Electronically signed by Sherald Hess MD on 10/19/2019 at 3:25:02 PM.    Final    VAS [place] LOWER EXTREMITY VENOUS (DVT)  Result Date: 10/26/2019  Lower Venous DVTStudy Indications: Covid-19, elevated D-Dimer.  Comparison Study: Prior negative left lower extremity venous duplex don 03/19/17                   and is available for comparison. Performing Technologist: Sherren Kerns RVS  Examination Guidelines: A complete evaluation includes B-mode imaging, spectral Doppler, color Doppler, and power Doppler as needed of all accessible portions of each vessel. Bilateral testing is considered an integral part of a complete examination. Limited examinations for reoccurring indications may be performed as noted. The reflux portion of the exam is performed with the patient in reverse Trendelenburg.  +---------+---------------+---------+-----------+----------+--------------+ RIGHT    CompressibilityPhasicitySpontaneityPropertiesThrombus Aging +---------+---------------+---------+-----------+----------+--------------+ CFV      Full           Yes      Yes                                 +---------+---------------+---------+-----------+----------+--------------+ SFJ      Full                                                        +---------+---------------+---------+-----------+----------+--------------+ FV Prox  Full                                                        +---------+---------------+---------+-----------+----------+--------------+ FV Mid   Full                                                         +---------+---------------+---------+-----------+----------+--------------+ FV DistalFull                                                        +---------+---------------+---------+-----------+----------+--------------+ PFV      Full                                                        +---------+---------------+---------+-----------+----------+--------------+ POP      Partial        Yes      Yes                  Acute          +---------+---------------+---------+-----------+----------+--------------+ PTV      Full                                                        +---------+---------------+---------+-----------+----------+--------------+  PERO     Full                                                        +---------+---------------+---------+-----------+----------+--------------+   +---------+---------------+---------+-----------+----------+--------------+ LEFT     CompressibilityPhasicitySpontaneityPropertiesThrombus Aging +---------+---------------+---------+-----------+----------+--------------+ CFV      Full           Yes      Yes                                 +---------+---------------+---------+-----------+----------+--------------+ SFJ      Full                                                        +---------+---------------+---------+-----------+----------+--------------+ FV Prox  Full                                                        +---------+---------------+---------+-----------+----------+--------------+ FV Mid   Full                                                        +---------+---------------+---------+-----------+----------+--------------+ FV DistalFull                                                        +---------+---------------+---------+-----------+----------+--------------+ PFV      Full                                                         +---------+---------------+---------+-----------+----------+--------------+ POP      Full           Yes      Yes                                 +---------+---------------+---------+-----------+----------+--------------+ PTV      Full                                                        +---------+---------------+---------+-----------+----------+--------------+ PERO     Full                                                        +---------+---------------+---------+-----------+----------+--------------+  Summary: RIGHT: - Findings consistent with acute deep vein thrombosis involving the right popliteal vein. - Ultrasound characteristics of enlarged lymph nodes are noted in the groin.  LEFT: - There is no evidence of deep vein thrombosis in the lower extremity.  *See table(s) above for measurements and observations. Electronically signed by Lemar Livings MD on 10/26/2019 at 4:50:27 PM.    Final    [place] EKG SITE RITE  Result Date: 10/12/2019 If Site Rite image not attached, placement could not be confirmed due to current cardiac rhythm.

## 2019-11-07 NOTE — Progress Notes (Signed)
  Speech Language Pathology Treatment: Cognitive-Linquistic  Patient Details Name: Micheal Bell MRN: [id number] DOB: 07-10-1970 Today's Date: 11/07/2019 Time: 8250-5397 SLP Time Calculation (min) (ACUTE ONLY): 25 min  Assessment / Plan / Recommendation Clinical Impression  Patient seen to address cognitive-linguistic goals with focus on assessment with [facility] Mental Status) assessment for which he received a score of 18, which is in the range of Dementia (1-19) for Less than High school Education population). Patient told SLP that he dropped out of school (private school) in 10th grade because he was going to be required to retake 3rd-12 grade tests. He also stated that he was in "LD class" (learning disabled). Patient's current errors are mainly in the delayed recall categories, but patient does state he had difficulty with that prior to this current admission. During session, rehab MD informed patient that expectation is for him to transfer to inpatient rehab unit today or tomorrow.      HPI: 50 yo male with hx of HTN, T2DM, gout, IBS, fibromyalgia, obesity who presented on 8/16 for COVID pneumonia; decompensated on 8/19-8/28 (self extubated 8/23 and reintubated same day). Hospitalization was complicated by MSSA and GBS bacteria and found to have systolic heart failure. CXR 9/1 Hypoinflation of the lungs with mild bibasilar subsegmental atelectasis.      SLP Plan  Continue with current plan of care       Recommendations   CIR                Follow up Recommendations: Inpatient Rehab SLP Visit Diagnosis: Cognitive communication deficit (Q73.419) Plan: Continue with current plan of care       Angela Nevin, MA, CCC-SLP 11/07/19 5:00 PM

## 2019-11-07 NOTE — Progress Notes (Signed)
Occupational Therapy Treatment Patient Details Name: Micheal Bell MRN: [id number] DOB: 01-24-1971 Today's Date: 11/07/2019    History of present illness Pt is a 49 y.o. male admitted 10/09/19 for COVID PNA. Pt decompensated 8/19 and intubated; self extubated 8/23 and reintubated; extubated 8/28. Course complicated by MSSA and GBS bacteria, sepsis, systolic HF, acute metabolic encephalopathy. 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 TEE without evidence of vegitation. 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   OT comments  Patient demonstrates nice progress with basic mobility, EOB sitting balance, safety and command following for seated ADL task.  Patient able to sit EOB for 20+ min and complete oral care, washing hands and washing his face.  Overall Min Guard for supine to sit with HOB elevated, supervision for grooming, with no complaints of back pain or loss of balance noted.  Command following was good for basic ADL performance. Pt continues to be limited by decreased strength, balance, endurance, and cognition. Continue to recommend CIR for intensive therapies.  Continue to follow in the acute setting to maximize functional gains.    Follow Up Recommendations  CIR;Supervision/Assistance - 24 hour    Equipment Recommendations  3 in 1 bedside commode;Hospital bed;Wheelchair (measurements OT);Wheelchair cushion (measurements OT)    Recommendations for Other Services      Precautions / Restrictions Precautions Precautions: Fall Restrictions Weight Bearing Restrictions: No       Mobility Bed Mobility   Bed Mobility: Supine to Sit     Supine to sit: Min assist;HOB elevated                            Balance Overall balance assessment: Needs assistance Sitting-balance support: Feet supported Sitting balance-Leahy Scale: Fair                                     ADL either performed or assessed with clinical judgement   ADL       Grooming: Oral care;Wash/dry face;Wash/dry hands;Supervision/safety;Sitting Grooming Details (indicate cue type and reason): unsupported EOB sitting.             Lower Body Dressing: Moderate assistance Lower Body Dressing Details (indicate cue type and reason): don socks EOB sitting.                                       Cognition Arousal/Alertness: Awake/alert Behavior During Therapy: WFL for tasks assessed/performed Overall Cognitive Status: Impaired/Different from baseline Area of Impairment: Memory                     Memory: Decreased short-term memory Following Commands: Follows multi-step commands with increased time;Follows multi-step commands inconsistently Safety/Judgement: Decreased awareness of safety;Decreased awareness of deficits                                Pertinent Vitals/ Pain       Pain Assessment: No/denies pain  Frequency  Min 2X/week        Progress Toward Goals  OT Goals(current goals can now be found in the care plan section)  Progress towards OT goals: Progressing toward goals  Acute Rehab OT Goals Patient Stated Goal: get home and walk farther OT Goal Formulation: With patient Time For Goal Achievement: 11/20/19 Potential to Achieve Goals: Good ADL Goals Additional ADL Goal #1: patient will maintain midline postural control EOB with supervision during grooming task.  Plan Discharge plan remains appropriate    Co-evaluation                 AM-PAC OT "6 Clicks" Daily Activity     Outcome Measure   Help from another person eating meals?: A Little Help from  another person taking care of personal grooming?: A Little Help from another person toileting, which includes using toliet, bedpan, or urinal?: A Lot Help from another person bathing (including washing, rinsing, drying)?: A Lot Help from another person to put on and taking off regular upper body clothing?: A Lot Help from another person to put on and taking off regular lower body clothing?: A Lot 6 Click Score: 14    End of Session    OT Visit Diagnosis: Unsteadiness on feet (R26.81);Other abnormalities of gait and mobility (R26.89);Muscle weakness (generalized) (M62.81);Other symptoms and signs involving cognitive function;Hemiplegia and hemiparesis;Pain Hemiplegia - Right/Left: 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

## 2019-11-07 NOTE — Progress Notes (Addendum)
Nutrition Follow-up  DOCUMENTATION CODES:   Not applicable  INTERVENTION:   -D/c Ensure Enlive po BID, each supplement provides 350 kcal and 20 grams of protein -MVI with minerals daily -Ensure Max po daily, each supplement provides 150 kcal and 30 grams of protein -Magic cup TID with meals, each supplement provides 290 kcal and 9 grams of protein  NUTRITION DIAGNOSIS:   Inadequate oral intake related to acute illness as evidenced by NPO status.  Progressing; advanced to heart healtyy/ carb modified diet on 10/25/19  GOAL:   Patient will meet greater than or equal to 90% of their needs  Progressing   MONITOR:   PO intake, Supplement acceptance, Labs, Weight trends, Skin, I & O's  REASON FOR ASSESSMENT:   Ventilator    ASSESSMENT:   49 yo male admitted with COVID 19 pneumonia requiring intubation. PMH includes HTN, gout, DM, CHF, IBS  8/16 Admitted to [facility] 8/19 Transferred to [facility], Intubated 8/25 Cortrak placed, gastric 8/28 Extubated 8/31 Cortrak dislodged during TEE 9/01 Diet advanced   Attempted to speak with pt via call to hospital room phone, however, unable to reach. Also attempted to speak with pt in room, however, pt was receiving nursing care at time of attempted visit.   Pt with good appetite; noted meal completion 40-100%. He has been refusing Ensure Enlive supplements.   Per [facility] team notes, plan to discharge to CIR once bed is available.   Medications reviewed and include magnesium oxide, melatonin, miralax, and senna.   Lab Results  Component Value Date   HGBA1C 11.3 (H) 10/26/2019   PTA DM medications are .   Labs reviewed: CBGS: 139-142 (inpatient orders for glycemic control are 0-15 units insulin aspart every 4 hours and 20 units insulin glargine daily).   Diet Order:   Diet Order            Diet heart healthy/carb modified Room service appropriate? No; Fluid consistency: Thin  Diet effective now                 EDUCATION NEEDS:    Not appropriate for education at this time  Skin:  Skin Assessment: Reviewed RN Assessment Skin Integrity Issues:: Diabetic Ulcer Diabetic Ulcer: L. Foot after stepping on nail 2-3 weeks ago Other: wound to L. Foot after stepping on nail 2-3 weeks ago  Last BM:  11/06/19  Height:   Ht Readings from Last 1 Encounters:  10/24/19 6\' 2"  (1.88 m)    Weight:   Wt Readings from Last 1 Encounters:  11/07/19 115.5 kg   BMI:  Body mass index is 32.69 kg/m.  Estimated Nutritional Needs:   Kcal:  2200-2400  Protein:  130-145 grams  Fluid:  > 2 L    11/09/19, RD, LDN, CDCES Registered Dietitian II Certified Diabetes Care and Education Specialist Please refer to [facility] for RD and/or RD on-call/weekend/after hours pager

## 2019-11-08 LAB — C-REACTIVE PROTEIN: CRP: 10.4 mg/dL — ABNORMAL HIGH (ref <1.0)

## 2019-11-08 LAB — COMPREHENSIVE METABOLIC PANEL
ALT: 18 U/L (ref 0–44)
AST: 20 U/L (ref 15–41)
Albumin: 2.2 g/dL — ABNORMAL LOW (ref 3.5–5.0)
Alkaline Phosphatase: 83 U/L (ref 38–126)
Anion gap: 10 (ref 5–15)
BUN: 5 mg/dL — ABNORMAL LOW (ref 6–20)
CO2: 25 mmol/L (ref 22–32)
Calcium: 8.7 mg/dL — ABNORMAL LOW (ref 8.9–10.3)
Chloride: 99 mmol/L (ref 98–111)
Creatinine, Ser: 0.7 mg/dL (ref 0.61–1.24)
GFR calc Af Amer: >60 mL/min (ref >60)
GFR calc non Af Amer: >60 mL/min (ref >60)
Glucose, Bld: 136 mg/dL — ABNORMAL HIGH (ref 70–99)
Potassium: 3.8 mmol/L (ref 3.5–5.1)
Sodium: 134 mmol/L — ABNORMAL LOW (ref 135–145)
Total Bilirubin: 0.9 mg/dL (ref 0.3–1.2)
Total Protein: 6.9 g/dL (ref 6.5–8.1)

## 2019-11-08 LAB — CBC WITH DIFFERENTIAL/PLATELET
Abs Immature Granulocytes: 0.07 10*3/uL (ref 0.00–0.07)
Basophils Absolute: 0 10*3/uL (ref 0.0–0.1)
Basophils Relative: 1 %
Eosinophils Absolute: 0.3 10*3/uL (ref 0.0–0.5)
Eosinophils Relative: 4 %
HCT: 29.2 % — ABNORMAL LOW (ref 39.0–52.0)
Hemoglobin: 9.3 g/dL — ABNORMAL LOW (ref 13.0–17.0)
Immature Granulocytes: 1 %
Lymphocytes Relative: 31 %
Lymphs Abs: 2.4 10*3/uL (ref 0.7–4.0)
MCH: 29.3 pg (ref 26.0–34.0)
MCHC: 31.8 g/dL (ref 30.0–36.0)
MCV: 92.1 fL (ref 80.0–100.0)
Monocytes Absolute: 0.7 10*3/uL (ref 0.1–1.0)
Monocytes Relative: 10 %
Neutro Abs: 4.2 10*3/uL (ref 1.7–7.7)
Neutrophils Relative %: 53 %
Platelets: 297 10*3/uL (ref 150–400)
RBC: 3.17 MIL/uL — ABNORMAL LOW (ref 4.22–5.81)
RDW: 13.5 % (ref 11.5–15.5)
WBC: 7.7 10*3/uL (ref 4.0–10.5)
nRBC: 0 % (ref 0.0–0.2)

## 2019-11-08 LAB — GLUCOSE, CAPILLARY
Glucose-Capillary: 128 mg/dL — ABNORMAL HIGH (ref 70–99)
Glucose-Capillary: 162 mg/dL — ABNORMAL HIGH (ref 70–99)
Glucose-Capillary: 128 mg/dL — ABNORMAL HIGH (ref 70–99)
Glucose-Capillary: 149 mg/dL — ABNORMAL HIGH (ref 70–99)
Glucose-Capillary: 161 mg/dL — ABNORMAL HIGH (ref 70–99)
Glucose-Capillary: 159 mg/dL — ABNORMAL HIGH (ref 70–99)

## 2019-11-08 LAB — BRAIN NATRIURETIC PEPTIDE: B Natriuretic Peptide: 36.1 pg/mL (ref 0.0–100.0)

## 2019-11-08 LAB — MAGNESIUM: Magnesium: 1.7 mg/dL (ref 1.7–2.4)

## 2019-11-08 MED ORDER — MAGNESIUM SULFATE 2 GM/50ML IV SOLN
2.0000 g | Freq: Once | INTRAVENOUS | Status: AC
  Administered 2019-11-08: 2 g via INTRAVENOUS
  Filled 2019-11-08: qty 50

## 2019-11-08 MED ORDER — POTASSIUM CHLORIDE CRYS ER 20 MEQ PO TBCR
20.0000 meq | EXTENDED_RELEASE_TABLET | Freq: Once | ORAL | Status: AC
  Administered 2019-11-08: 20 meq via ORAL
  Filled 2019-11-08: qty 1

## 2019-11-08 MED ORDER — FUROSEMIDE 40 MG PO TABS
40.0000 mg | ORAL_TABLET | Freq: Once | ORAL | Status: AC
  Administered 2019-11-08: 40 mg via ORAL
  Filled 2019-11-08: qty 1

## 2019-11-08 MED ORDER — ZOLPIDEM TARTRATE 5 MG PO TABS
5.0000 mg | ORAL_TABLET | Freq: Every evening | ORAL | Status: DC | PRN
  Administered 2019-11-08 – 2019-11-14 (×6): 5 mg via ORAL
  Filled 2019-11-08 (×6): qty 1

## 2019-11-08 NOTE — Plan of Care (Signed)
  Problem: Education: Goal: Knowledge of risk factors and measures for prevention of condition will improve Outcome: Progressing   

## 2019-11-08 NOTE — Progress Notes (Signed)
PROGRESS NOTE                                                                                                                                                                                                             Patient Demographics:    Micheal Bell, is a 49 y.o. male, DOB - 1970-07-28, ZOX:[id number]  Outpatient Primary MD for the patient is [name], MD    LOS - 27  Admit date - 10/09/2019    Chief Complaint  Patient presents with  . Cough       Brief Narrative - 49 yo male with hx of HTN, T2DM, gout, IBS, fibromyalgia, obesity who presented on 8/16 for COVID pneumonia.  He decompensated on 8/19 and was intubated.  Self extubated on 8/23 and was reintubated.  He's subsequently been extubated on 8/28.  He's been on typical therapies for covid including steroids, baricitinib and remdesivir.  His hospitalization was complicated by MSSA and GBS bacteria.  Also found to have systolic heart failure.    Subjective:   Patient in bed, appears comfortable, denies any headache, no fever, no chest pain or pressure, no shortness of breath , no abdominal pain. No focal weakness.  Does have mild cough.   Assessment  & Plan :   Acute Hypoxic Resp. Failure due to Acute Covid 19 Viral Pneumonitis during the ongoing 2020 Covid 19 Pandemic - he had severe hypoxia and parenchymal lung injury he was intubated and admitted by ICU, he was started on IV steroids, remdesivir and Baricitinib on 10/12/2019.  He was subsequently extubated on 10/21/2019.  His stay was complicated by bacteremia after which Baricitinib was discontinued on 10/24/2019.  His pulmonary status is gradually improving, currently he is stable and symptom-free, he is on room air, I have discussed with him about incentive spirometry, flutter valve, pulmonary toiletry, he is now off of isolation and his pulmonary issues from COVID-19 is clinically resolved.   Recent Labs    Lab 11/03/19 0356 11/05/19 0356 11/05/19 0357 11/06/19 0339 11/07/19 0350 11/08/19 0450  WBC 8.6 9.6  --  9.7 8.1 7.7  PLT 328 353  --  354 305 297  CRP  --  9.8*  --  11.4* 9.8* 10.4*  BNP  --   --  36.0  --   --   --  DDIMER  --  5.95*  --   --   --   --   PROCALCITON  --  <0.10  --   --   --   --   AST 17 25  --  21 17 20   ALT 17 17  --  18 17 18   ALKPHOS 85 84  --  89 80 83  BILITOT 0.8 0.7  --  0.7 0.8 0.9  ALBUMIN 2.1* 2.2*  --  2.3* 2.1* 2.2*    Sepsis with MSSA and Group B strep bacteremia due to left foot abscess.  ID following, was on IV Ancef but switch to nafcillin on 10/26/2019.  Clean TEE, history of Charcot joints, peripheral neuropathy and recent left foot soft tissue injury, MRI left foot shows an abscess, Ortho was consulted and they recommended medical management with outpatient follow-up with Dr. post discharge, also has osteomyelitis, defer duration of treatment with antibiotics to ID.  Antibiotics management per ID, plan to continue total of 4 weeks of IV nafcillin, through 9/29, then start Keflex 500 mg 4 times daily, he has a follow-up arranged with Dr. [name] on October 13 at 10:30 AM .   Mild dry cough on 11/08/2019.  Check BNP and chest x-ray, trial of Lasix and monitor.     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  For seizures he has been started on Keppra with good effect and his mentation has improved considerably. Continue PT OT and speech, SNF VS CIR .  Neuro has seen the patient this admission.   Possible systolic heart failure as shown by TTE however this could have been transient due to sepsis, on repeat TEE  his EF is now normalized to 50 to 55%.  He is currently compensated, blood pressure was too low and after hydration has improved, continue beta-blocker.   Cyanotic right lower extremity toes.  Likely due to hypoperfusion which is transient, arterial ultrasound stable. Resolved.   History of chronic pain.  On multiple narcotics, Flexeril and benzodiazepines.  Was off of these medications for several days without any discomfort, reintroduced on his request at very low doses.  Do not escalate.  HTN - BP was low on multiple blood pressure medications, was hydrated now able to tolerate low-dose beta-blocker.  Acute DVT in the right lower extremity.  Switched to full dose Lovenox will switch to Eliquis on 11/07/2019.   Hypomagnesemia.  Persistently low despite aggressive treatment, have placed on scheduled oral supplementation.  DM type II.  Currently on Lantus and sliding scale monitor.  Lab Results  Component Value Date   HGBA1C 11.3 (H) 10/26/2019   CBG (last 3)  Recent Labs    11/07/19 2354 11/08/19 0356 11/08/19 0812  GLUCAP 119* 128* 162*      Condition -   Guarded  Family Communication  :  Sister and mother [phone] - 10/25/19, 10/27/19, 10/28/19, 11/03/19  Code Status :  Full  Consults  :  PCCM, ID, Cards  Procedures  :    EEG - This study showed evidence of potential epileptogenicity arising from bilateral frontocentral, maximal vertex region. The sharp waves are  at times rhythmic consistent with brief ictal-interictal rhythmic discharges. Additionally, there is evidence of mild diffuse encephalopathy, nonspecific etiology.  Bilateral lower extremity venous ultrasound - Acute right lower extremity popliteal vein DVT.  Carotid duplex.  Nonacute.    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  CT Head -  1. No acute intracranial abnormality. 2. Area of subcortical low-density  involving the posterior left frontal lobe at the convexity, likely encephalomalacia and sequela of remote infarct. There are no prior exams for comparison to establish chronicity. MRI could be considered for further evaluation based on clinical concern. 3. Paranasal sinus fluid levels on opacification of the right greater than left mastoid air cells, possibly related to intubation.  R. Leg Arterial US -  No evidence of arterial occlusive disease. Normal waveforms, no stenosis  TTE -  1. Extremely poor acoustic windows. LVEF appears to be depressed with diffuse hypokinesis, worse in the mid/distal inferior/inferoseptal walls. . Left ventricular ejection fraction, by estimation, is 30 to 35%. The left ventricle has moderately decreased function. The left ventricular internal cavity size was mildly dilated. Left ventricular diastolic parameters are indeterminate.  2. Right ventricular systolic function is moderately reduced. The right ventricular size is mildly enlarged.  3. The mitral valve is normal in structure. Trivial mitral valve regurgitation.  4. The aortic valve is normal in structure. Aortic valve regurgitation is not visualized  TEE - 1. Left ventricular ejection fraction, by estimation, is 50 to 55%. The left ventricle has low normal function. The left ventricle has no regional wall motion abnormalities.  2. Right ventricular systolic function is normal. The right ventricular size is normal.  3. No left atrial/left atrial appendage thrombus was detected.  4. The mitral valve is normal in structure. Trivial mitral valve regurgitation. No evidence of mitral stenosis.  5. The aortic valve is normal in structure. Aortic valve regurgitation is not visualized. No aortic stenosis is present.  6. The inferior vena cava is normal in size with greater than 50% respiratory variability, suggesting right atrial pressure of 3 mmHg.  CT - 1. No acute intracranial abnormality. 2. Area of subcortical low-density  involving the posterior left frontal lobe at the convexity, likely encephalomalacia and sequela of remote infarct. There are no prior exams for comparison to establish chronicity. MRI could be considered for further evaluation based on clinical concern. 3. Paranasal sinus fluid levels on opacification of the right greater than left mastoid air cells, possibly related to intubation    PUD Prophylaxis : PPI  Disposition Plan  :    Status is: Inpatient  Remains inpatient appropriate because:IV treatments appropriate due to intensity of illness or inability to take PO   Dispo: The patient is from: Home              Anticipated d/c is to: Home              Anticipated d/c date is: 1 day              Patient currently is medically stable to d/c. to CIR when bed is available.   DVT Prophylaxis  :  Lovenox    Lab Results  Component Value Date   PLT 297 11/08/2019    Diet :  Diet Order            Diet heart healthy/carb modified Room service appropriate? No; Fluid consistency: Thin  Diet effective now  Inpatient Medications  Scheduled Meds: . alteplase  2 mg Intracatheter Once  . apixaban  5 mg Oral BID  . atorvastatin  40 mg Oral Daily  . buPROPion  150 mg Oral Daily  . Chlorhexidine Gluconate Cloth  6 each Topical Daily  . docusate sodium  100 mg Oral BID  . insulin aspart  0-15 Units Subcutaneous Q4H  . insulin glargine  20 Units Subcutaneous Daily  . levETIRAcetam  500 mg Oral BID  . magnesium oxide  800 mg Oral BID  . melatonin  3 mg Oral QHS  . metoprolol tartrate  50 mg Oral BID  . multivitamin with minerals  1 tablet Oral Daily  . pantoprazole  40 mg Oral Daily  . polyethylene glycol  17 g Oral Daily  . Ensure Max Protein  11 oz Oral QHS  . senna  2 tablet Oral BID  . sodium chloride flush  10-40 mL Intracatheter Q12H   Continuous Infusions: . sodium chloride 10 mL/hr at 11/02/19 0600  . nafcillin (NAFCIL) continuous infusion 12 g (11/07/19  1222)   PRN Meds:.sodium chloride, acetaminophen, bisacodyl, cyclobenzaprine, HYDROcodone-acetaminophen, Ipratropium-Albuterol, zolpidem  Antibiotics  :    Anti-infectives (From admission, onward)   Start     Dose/Rate Route Frequency Ordered Stop   10/31/19 1415  nafcillin 12 g in sodium chloride 0.9 % 500 mL continuous infusion  Status:  Discontinued        12 g 20.8 mL/hr over 24 Hours Intravenous Every 24 hours 10/31/19 0608 10/31/19 0609   10/29/19 1415  nafcillin 12 g in dextrose 5 % 500 mL IVPB  Status:  Discontinued        12 g 20.8 mL/hr over 24 Hours Intravenous Every 24 hours 10/29/19 0434 10/29/19 0507   10/29/19 1415  nafcillin 12 g in sodium chloride 0.9 % 500 mL continuous infusion        12 g 20.8 mL/hr over 24 Hours Intravenous Every 24 hours 10/29/19 0507 11/22/19 2359   10/26/19 1415  nafcillin 12 g in dextrose 5 % 500 mL IVPB  Status:  Discontinued        12 g 20.8 mL/hr over 24 Hours Intravenous Every 24 hours 10/26/19 1414 10/29/19 0434   10/26/19 1400  nafcillin injection 12 g  Status:  Discontinued        12 g Intravenous Daily 10/26/19 1238 10/26/19 1242   10/26/19 1400  nafcillin 12 g in dextrose 5 % 50 mL IVPB  Status:  Discontinued        12 g 100 mL/hr over 30 Minutes Intravenous Every 24 hours 10/26/19 1242 10/26/19 1339   10/26/19 1400  nafcillin 12 g in dextrose 5 % 500 mL IVPB  Status:  Discontinued        12 g 20.8 mL/hr over 24 Hours Intravenous Every 24 hours 10/26/19 1337 10/26/19 1416   10/26/19 1200  nafcillin 2 g in sodium chloride 0.9 % 100 mL IVPB  Status:  Discontinued        2 g 200 mL/hr over 30 Minutes Intravenous Every 4 hours 10/26/19 1010 10/26/19 1238   10/12/19 2200  cefTRIAXone (ROCEPHIN) 2 g in sodium chloride 0.9 % 100 mL IVPB  Status:  Discontinued        2 g 200 mL/hr over 30 Minutes Intravenous Every 24 hours 10/12/19 0441 10/12/19 0921   10/12/19 0930  ceFAZolin (ANCEF) IVPB 2g/100 mL premix  Status:  Discontinued  2  g 200 mL/hr over 30 Minutes Intravenous Every 8 hours 10/12/19 0921 10/26/19 1010   10/11/19 1000  remdesivir 100 mg in sodium chloride 0.9 % 100 mL IVPB       "Followed by" Linked Group Details   100 mg 200 mL/hr over 30 Minutes Intravenous Daily 10/10/19 0050 10/14/19 0956   10/11/19 0800  vancomycin (VANCOCIN) IVPB 1000 mg/200 mL premix  Status:  Discontinued        1,000 mg 200 mL/hr over 60 Minutes Intravenous Every 8 hours 10/10/19 2239 10/12/19 0921   10/10/19 2230  vancomycin (VANCOCIN) IVPB 1000 mg/200 mL premix        1,000 mg 200 mL/hr over 60 Minutes Intravenous Every 1 hr x 2 10/10/19 2208 10/11/19 0052   10/10/19 2215  cefTRIAXone (ROCEPHIN) 2 g in sodium chloride 0.9 % 100 mL IVPB  Status:  Discontinued        2 g 200 mL/hr over 30 Minutes Intravenous Every 24 hours 10/10/19 2205 10/12/19 0441   10/10/19 0100  remdesivir 100 mg in sodium chloride 0.9 % 100 mL IVPB       "Followed by" Linked Group Details   100 mg 200 mL/hr over 30 Minutes Intravenous Every 30 min 10/10/19 0050 10/10/19 0332        Susa Raring M.D on 11/08/2019 at 10:58 AM  To page go to [email or web address] -  [facility] -  Office  [phone]   See all Orders from today for further details    Objective:   Vitals:   11/07/19 1401 11/07/19 2003 11/07/19 2033 11/08/19 0358  BP: 121/81 123/78 123/78 (!) 119/94  Pulse: 87 (!) 110 (!) 110 93  Resp: 16 18  18   Temp: 98.7 F (37.1 C) 99.4 F (37.4 C)  97.9 F (36.6 C)  TempSrc: Oral Oral  Oral  SpO2: 93% 92%  96%  Weight:      Height:        Wt Readings from Last 3 Encounters:  11/07/19 115.5 kg  09/11/19 111.1 kg  05/12/17 (!) 145.2 kg     Intake/Output Summary (Last 24 hours) at 11/08/2019 1058 Last data filed at 11/08/2019 1000 Gross per 24 hour  Intake 957 ml  Output 2776 ml  Net -1819 ml     Physical Exam  Awake Alert, No new F.N deficits, chronic bilateral rotator cuff injury induced bilateral upper extremity weakness  in the deltoid area, per patient this is old and unchanged,  chronic foot wounds on both feet under bandage, right arm PICC line in place. [place].AT,PERRAL Supple Neck,No JVD, No cervical lymphadenopathy appriciated.  Symmetrical Chest wall movement, Good air movement bilaterally, CTAB RRR,No Gallops, Rubs or new Murmurs, No Parasternal Heave +ve B.Sounds, Abd Soft, No tenderness, No organomegaly appriciated, No rebound - guarding or rigidity.       Data Review:    CBC Recent Labs  Lab 11/03/19 0356 11/05/19 0356 11/06/19 0339 11/07/19 0350 11/08/19 0450  WBC 8.6 9.6 9.7 8.1 7.7  HGB 9.6* 10.1* 10.2* 9.4* 9.3*  HCT 30.2* 30.9* 32.3* 29.0* 29.2*  PLT 328 353 354 305 297  MCV 93.8 92.8 94.4 92.9 92.1  MCH 29.8 30.3 29.8 30.1 29.3  MCHC 31.8 32.7 31.6 32.4 31.8  RDW 13.1 13.2 13.4 13.6 13.5  LYMPHSABS 2.6 3.0 3.1 2.5 2.4  MONOABS 0.7 0.8 0.7 0.8 0.7  EOSABS 0.4 0.5 0.5 0.4 0.3  BASOSABS 0.1 0.0 0.0 0.0 0.0    Recent Labs  Lab 11/03/19 0356 11/03/19 0356 11/04/19 1347 11/05/19 0356 11/05/19 0357 11/06/19 0339 11/07/19 0350 11/08/19 0450  NA 136  --   --  135  --  134* 136 134*  K 3.6  --   --  4.0  --  4.0 3.7 3.8  CL 102  --   --  100  --  98 101 99  CO2 24  --   --  25  --  26 25 25   GLUCOSE 105*  --   --  155*  --  163* 141* 136*  BUN <5*  --   --  5*  --  <5* <5* 5*  CREATININE 0.70  --   --  0.76  --  0.78 0.75 0.70  CALCIUM 8.8*  --   --  8.9  --  8.9 8.6* 8.7*  AST 17  --   --  25  --  21 17 20   ALT 17  --   --  17  --  18 17 18   ALKPHOS 85  --   --  84  --  89 80 83  BILITOT 0.8  --   --  0.7  --  0.7 0.8 0.9  ALBUMIN 2.1*  --   --  2.2*  --  2.3* 2.1* 2.2*  MG 1.4*   < > 1.8 1.6*  --  1.8 1.5* 1.7  CRP  --   --   --  9.8*  --  11.4* 9.8* 10.4*  DDIMER  --   --   --  5.95*  --   --   --   --   PROCALCITON  --   --   --  <0.10  --   --   --   --   BNP  --   --   --   --  36.0  --   --   --    < > = values in this interval not displayed.     ------------------------------------------------------------------------------------------------------------------ No results for input(s): CHOL, HDL, LDLCALC, TRIG, CHOLHDL, LDLDIRECT in the last 72 hours.  Lab Results  Component Value Date   HGBA1C 11.3 (H) 10/26/2019   ------------------------------------------------------------------------------------------------------------------ No results for input(s): TSH, T4TOTAL, T3FREE, THYROIDAB in the last 72 hours.  Invalid input(s): FREET3  Cardiac Enzymes No results for input(s): CKMB, TROPONINI, MYOGLOBIN in the last 168 hours.  Invalid input(s): CK ------------------------------------------------------------------------------------------------------------------    Component Value Date/Time   BNP 36.0 11/05/2019 0357    Micro Results No results found for this or any previous visit (from the past 240 hour(s)).  Radiology Reports CT HEAD WO CONTRAST  Result Date: 10/19/2019 CLINICAL DATA:  Deliriums.  Mental status change. EXAM: CT HEAD WITHOUT CONTRAST TECHNIQUE: Contiguous axial images were obtained from the base of the skull through the vertex without intravenous contrast. COMPARISON:  None. FINDINGS: Brain: Area subcortical low-density involving the posterior left frontal lobe at the convexity, series 3, image 25 and series 5, image 49, likely encephalomalacia and sequela of remote infarct, however nonspecific. No hemorrhage. No evidence of acute ischemia. No hydrocephalus, midline shift or mass effect. No subdural or extra-axial collection. Vascular: Atherosclerosis of skullbase vasculature without hyperdense vessel or abnormal calcification. Skull: No fracture or focal lesion. Sinuses/Orbits: Nasogastric tube in place. Fluid levels in the sphenoid sinuses and right maxillary sinus with scattered opacification of ethmoid air cells may be related to intubation. Opacification of the right greater than left mastoid air cells. Other:  None. IMPRESSION: 1.  No acute intracranial abnormality. 2. Area of subcortical low-density involving the posterior left frontal lobe at the convexity, likely encephalomalacia and sequela of remote infarct. There are no prior exams for comparison to establish chronicity. MRI could be considered for further evaluation based on clinical concern. 3. Paranasal sinus fluid levels on opacification of the right greater than left mastoid air cells, possibly related to intubation. Electronically Signed   By: Narda Rutherford M.D.   On: 10/19/2019 15:30   CT ANGIO CHEST PE W OR WO CONTRAST  Result Date: 10/23/2019 CLINICAL DATA:  COVID pneumonia, acute hypoxic respiratory failure, positive D-dimer EXAM: CT ANGIOGRAPHY CHEST WITH CONTRAST TECHNIQUE: Multidetector CT imaging of the chest was performed using the standard protocol during bolus administration of intravenous contrast. Multiplanar CT image reconstructions and MIPs were obtained to evaluate the vascular anatomy. CONTRAST:  75mL OMNIPAQUE IOHEXOL 350 MG/ML SOLN COMPARISON:  None. FINDINGS: Cardiovascular: Satisfactory opacification of the pulmonary arteries to the segmental level. No evidence of pulmonary embolism. The central pulmonary arteries are of normal caliber. There is moderate coronary artery calcification. Normal heart size. No pericardial effusion. The thoracic aorta is unremarkable save for bovine arch anatomy. Right upper extremity PICC line tip is seen within the superior vena cava. Mediastinum/Nodes: No enlarged mediastinal, hilar, or axillary lymph nodes. Thyroid gland, trachea, and esophagus demonstrate no significant findings. Nasoenteric feeding tube extends into the upper abdomen beyond the margin of the examination. Lungs/Pleura: Lung volumes are small. Evaluation is limited by motion artifact, however, multifocal pulmonary infiltrates are identified demonstrating a peripheral and basal predominance, likely infectious or inflammatory in the  acute setting. No central obstructing lesion. No pneumothorax or pleural effusion. No superimposed interstitial edema. Upper Abdomen: No acute abnormality. Musculoskeletal: No chest wall abnormality. No acute or significant osseous findings. Review of the MIP images confirms the above findings. IMPRESSION: 1. No evidence of pulmonary embolism. 2. Multifocal pulmonary infiltrates are identified demonstrating a peripheral and basal predominance, likely infectious or inflammatory in the acute setting. 3. Moderate coronary artery calcification. Electronically Signed   By: Helyn Numbers MD   On: 10/23/2019 16:25   MR ANGIO HEAD WO CONTRAST  Result Date: 10/29/2019 CLINICAL DATA:  Neuro deficit with stroke suspected EXAM: MRA HEAD WITHOUT CONTRAST TECHNIQUE: Angiographic images of the Circle of Willis were obtained using MRA technique without intravenous contrast. COMPARISON:  Four days ago FINDINGS: History of bacteremia and embolic infarction. No vessel beading, stenosis, or pseudoaneurysm. IMPRESSION: Negative intracranial MRA. Electronically Signed   By: Marnee Spring M.D.   On: 10/29/2019 12:04   MR BRAIN WO CONTRAST  Result Date: 10/25/2019 CLINICAL DATA:  Neuro deficit, acute, stroke suspected. Additional history provided: COVID positive. Additional history obtained from electronic MEDICAL RECORD NUMBERSepsis with MSSA and group B strep bacteremia. EXAM: MRI HEAD WITHOUT CONTRAST TECHNIQUE: Multiplanar, multiecho pulse sequences of the brain and surrounding structures were obtained without intravenous contrast. COMPARISON:  Head CT 10/19/2019. FINDINGS: Brain: The examination is limited by poor signal on several sequences as well as motion degradation. Most notably, there is moderate motion degradation of the axial T2 weighted sequence, severe motion degradation of the axial SWI sequence and moderate motion degradation of the coronal T2 weighted sequence. Cerebral volume is normal for age. There is a 17 mm  focus of restricted diffusion and corresponding T2/FLAIR hyperintensity within the subcortical left frontoparietal lobes (for instance as seen on series 2, image 39) (series 3, image 17). Additional mild scattered T2/FLAIR hyperintensity within the cerebral white matter and  pons is nonspecific, but consistent with chronic small vessel ischemic disease. Severe motion degradation of the axial SWI sequence precludes adequate evaluation for intracranial blood products. No extra-axial fluid collection. No midline shift. Vascular: Expected proximal arterial flow voids. Skull and upper cervical spine: No focal marrow lesion is identified within described limitations. Sinuses/Orbits: Visualized orbits show no acute finding. Air-fluid levels within the sphenoid and right maxillary sinuses. Partial opacification of right ethmoid air cells. Bilateral mastoid effusions. These results will be called to the ordering clinician or representative by the Radiologist Assistant, and communication documented in the PACS or [employer]. IMPRESSION: Examination limited by poor signal on several sequences as well as motion degradation, as described. 17 mm focus of restricted diffusion and T2/FLAIR hyperintensity within the subcortical left frontoparietal lobes. This finding is nonspecific, but given the patient's history, the primary differential considerations are acute/early subacute infarct versus cerebritis. Consider contrast-enhanced MR imaging of the brain for further evaluation of this lesion. Mild chronic small vessel ischemic disease. Paranasal sinus disease with air-fluid levels. Bilateral mastoid effusions. Electronically Signed   By: Jackey Loge DO   On: 10/25/2019 15:16   MR BRAIN W CONTRAST  Result Date: 10/25/2019 CLINICAL DATA:  Brain mass follow-up EXAM: MRI HEAD WITH CONTRAST TECHNIQUE: Multiplanar, multiecho pulse sequences of the brain and surrounding structures were obtained with intravenous contrast.  CONTRAST:  10mL GADAVIST GADOBUTROL 1 MMOL/ML IV SOLN COMPARISON:  Brain MRI without contrast 10/25/2019 FINDINGS: Brain: There is a peripherally enhancing lesion at the base of the left precentral gyrus that measures 0.9 x 0.7 x 1.8 cm. There are no other areas of abnormal contrast enhancement. 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 Electronically Signed   By: Deatra Robinson M.D.   On: 10/25/2019 22:47   MR FOOT LEFT WO CONTRAST  Result Date: 10/25/2019 CLINICAL DATA:  Bacteremia left foot pain and diabetic EXAM: MRI OF THE LEFT FOOT WITHOUT CONTRAST TECHNIQUE: Multiplanar, multisequence MR imaging of the left was performed. No intravenous contrast was administered. COMPARISON:  October 23, 2019 FINDINGS: Bones/Joint/Cartilage Mildly angulated nondisplaced fractures of the midshaft of the fourth and fifth metatarsals are noted. There is periosteal reaction seen along the medial margins of the metatarsal shafts. Increased heterogeneous T2 hyperintense signal with subtle T1 hypointensity seen at the base of the fourth and fifth metatarsals. No other osseous marrow signal abnormality is seen. There is no large knee joint effusions noted. The articular surfaces appear to be maintained. Ligaments The Lisfranc ligament is intact. Muscles and Tendons Increased feathery signal with atrophy is seen throughout the muscles of the forefoot. There is a small amount of fluid seen surrounding the posterior tibialis tendon. The remainder of the flexor and extensor tendons are intact. Soft tissues Lateral plantar surface of the forefoot overlying the fifth metatarsal base there is a focal area of ulceration measuring approximately 8 mm in transverse dimension. A fluid-filled sinus tract is seen extending to the overlying osseous surface. There is a multilocular fluid collection which extends around the dorsal surface of the  fifth metatarsal measuring approximately 5.4 x 0.9 x 3.4 cm. There is extensive dorsal and lateral subcutaneous edema and skin thickening. IMPRESSION: Superficial area of ulceration over the plantar lateral aspect of the fifth metatarsal base with a fluid-filled sinus tract and loculated probable abscess extending over the dorsal surface of the fifth metatarsal measuring 5.4 x 0.9 by is a 3.4 cm. Incomplete mildly  angulated fourth and fifth metatarsal shaft fractures with periosteal reaction Findings which could be suggestive of reactive marrow versus early osteomyelitis involving the base of the fourth and fifth metatarsals. Electronically Signed   By: Jonna Clark M.D.   On: 10/25/2019 19:06   DG Chest Port 1 View  Result Date: 10/26/2019 CLINICAL DATA:  Short of breath.  COVID positive EXAM: PORTABLE CHEST 1 VIEW COMPARISON:  10/25/2019 FINDINGS: Hypoventilation with decreased lung volume. Mild bibasilar airspace disease unchanged. No new infiltrate or effusion. Right arm PICC tip in the SVC unchanged. IMPRESSION: Hypoventilation with bibasilar mild airspace disease unchanged. Electronically Signed   By: Marlan Palau M.D.   On: 10/26/2019 16:10   DG Chest Port 1 View  Result Date: 10/25/2019 CLINICAL DATA:  Shortness of breath. EXAM: PORTABLE CHEST 1 VIEW COMPARISON:  October 16, 2019. FINDINGS: The heart size and mediastinal contours are within normal limits. Hypoinflation of the lungs is noted with mild bibasilar subsegmental atelectasis. Endotracheal tube has been removed. Right-sided PICC line is unchanged in position. The visualized skeletal structures are unremarkable. IMPRESSION: Hypoinflation of the lungs with mild bibasilar subsegmental atelectasis. Electronically Signed   By: Lupita Raider M.D.   On: 10/25/2019 08:37   DG CHEST PORT 1 VIEW  Result Date: 10/16/2019 CLINICAL DATA:  Status post intubation. EXAM: PORTABLE CHEST 1 VIEW COMPARISON:  10/14/2019 FINDINGS: ET tube tip is above the  carina. There is a right arm PICC line with tip at the cavoatrial junction. Lung volumes are low. Similar appearance of bilateral pulmonary opacities compatible with multifocal pneumonia. IMPRESSION: 1. Stable support apparatus. 2. Persistent bilateral pulmonary opacities compatible with multifocal pneumonia. Electronically Signed   By: Signa Kell M.D.   On: 10/16/2019 06:14   DG Chest Port 1 View  Result Date: 10/15/2019 CLINICAL DATA:  49 year old male with history of COVID infection. EXAM: PORTABLE CHEST 1 VIEW COMPARISON:  Chest x-ray 10/14/2019. FINDINGS: An endotracheal tube is in place with tip 3.6 cm above the carina. A nasogastric tube is seen extending into the stomach, however, the tip of the nasogastric tube extends below the lower margin of the image. There is a right upper extremity PICC with tip terminating in the right atrium. Lung volumes are low. Widespread patchy areas of interstitial prominence an ill-defined airspace disease again noted throughout the lungs bilaterally. Overall, aeration is essentially unchanged. No pleural effusions. No evidence of pulmonary edema. No pneumothorax. Heart size is normal. Upper mediastinal contours are within normal limits. IMPRESSION: 1. Support apparatus, as above. 2. The appearance the chest is compatible with multilobar pneumonia from reported COVID infection. Electronically Signed   By: Trudie Reed M.D.   On: 10/15/2019 05:39   DG Chest Port 1 View  Result Date: 10/14/2019 CLINICAL DATA:  COVID-19, ARDS EXAM: PORTABLE CHEST 1 VIEW COMPARISON:  10/13/2019 FINDINGS: Single frontal view of the chest demonstrates stable position of the endotracheal tube and enteric catheter. There is a right-sided PICC tip overlying superior vena cava. The cardiac silhouette is stable. Interstitial and ground-glass opacities are seen throughout the lungs bilaterally, unchanged. Lung volumes are diminished. No effusion or pneumothorax. IMPRESSION: 1. 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 2. Stable support devices. Electronically Signed   By: Sharlet Salina M.D.   On: 10/14/2019 15:57   DG Chest Port 1 View  Result Date: 10/13/2019 CLINICAL DATA:  Acute respiratory failure with hypoxemia, COVID positive, asthma, diabetes mellitus,  hypertension, CHF EXAM: PORTABLE CHEST 1 VIEW COMPARISON:  Portable exam 1021 hours compared to 10/12/2019 FINDINGS: Tip of endotracheal tube projects 3.7 cm above carina. Nasogastric tube extends into stomach. RIGHT arm PICC line tip projects over cavoatrial junction. Stable heart size mediastinal contours for technique. Patchy BILATERAL airspace infiltrates consistent with multifocal pneumonia, minimally improved. No pleural effusion or pneumothorax. IMPRESSION: Minimally improved pulmonary infiltrates. Electronically Signed   By: Ulyses Southward M.D.   On: 10/13/2019 10:37   DG CHEST PORT 1 VIEW  Result Date: 10/12/2019 CLINICAL DATA:  Intubation EXAM: PORTABLE CHEST 1 VIEW COMPARISON:  Earlier today FINDINGS: New endotracheal tube with tip at the clavicular heads, measuring 3 cm above the carina. Low volume chest with severe airspace disease. No visible effusion or air leak. Stable heart size which is distorted by positioning and volumes. IMPRESSION: 1. Unremarkable positioning of the endotracheal tube. 2. Stable extensive airspace disease with low lung volumes. Electronically Signed   By: Marnee Spring M.D.   On: 10/12/2019 07:13   DG CHEST PORT 1 VIEW  Result Date: 10/12/2019 CLINICAL DATA:  Shortness of breath EXAM: PORTABLE CHEST 1 VIEW COMPARISON:  Three days ago FINDINGS: Very low volume chest with diffuse and patchy pulmonary opacity. Vascular pedicle widening and prominent heart size largely related to technique. No visible effusion or pneumothorax. IMPRESSION: Worsening multifocal pneumonia.  Lung volumes remain very low.  Electronically Signed   By: Marnee Spring M.D.   On: 10/12/2019 04:07   DG Chest Portable 1 View  Result Date: 10/09/2019 CLINICAL DATA:  Cough, fever, shortness of breath EXAM: PORTABLE CHEST 1 VIEW COMPARISON:  None. FINDINGS: Single frontal view of the chest demonstrates unremarkable cardiac silhouette. The lung volumes are diminished, with patchy bilateral areas of faint ground-glass airspace disease consistent with multifocal pneumonia. No effusion or pneumothorax. IMPRESSION: 1. Patchy bilateral ground-glass airspace disease consistent with multifocal pneumonia. Electronically Signed   By: Sharlet Salina M.D.   On: 10/09/2019 19:32   DG Abd Portable 1V  Result Date: 10/20/2019 CLINICAL DATA:  GI problem. EXAM: PORTABLE ABDOMEN - 1 VIEW COMPARISON:  10/16/2019 FINDINGS: Feeding tube tip is in the distal stomach or proximal duodenum. Mild gaseous distention of the stomach. Gas throughout nondistended large and small bowel. No evidence of bowel obstruction, organomegaly or free air. IMPRESSION: Feeding tube tip in the distal stomach or proximal duodenum. No evidence of obstruction or free air. Electronically Signed   By: Charlett Nose M.D.   On: 10/20/2019 21:28   DG Abd Portable 1V  Result Date: 10/16/2019 CLINICAL DATA:  Status post OG tube placement EXAM: PORTABLE ABDOMEN - 1 VIEW COMPARISON:  10/16/2019 FINDINGS: The enteric tube tip projects over the distal body of stomach in the side port is below the level of the GE junction. A few air-filled loops of small bowel are noted within the upper abdomen which measure up to 2.8 cm. IMPRESSION: Satisfactory position of enteric tube. Electronically Signed   By: Signa Kell M.D.   On: 10/16/2019 06:52   DG Abd Portable 1V  Result Date: 10/12/2019 CLINICAL DATA:  Onychogryphosis EXAM: PORTABLE ABDOMEN - 1 VIEW COMPARISON:  Portable exam 0830 hours without priors for comparison FINDINGS: Tip of nasogastric tube projects over distal antrum.  Nonobstructive bowel gas pattern. No bowel dilatation or bowel wall thickening. Patchy bibasilar infiltrates. No acute osseous findings. IMPRESSION: Tip of nasogastric tube projects over distal gastric antrum. Nonobstructive bowel gas pattern. Bibasilar pulmonary infiltrates. Electronically Signed   By: Loraine Leriche  Tyron Russell M.D.   On: 10/12/2019 08:51   DG Foot 2 Views Left  Result Date: 10/23/2019 CLINICAL DATA:  Bacteremia, LEFT foot pain and diabetic. EXAM: LEFT FOOT - 2 VIEW COMPARISON:  10/13/2019 and prior radiographs FINDINGS: Mildly angulated fractures of the 4th and 5th metatarsals are again noted with fracture lines still evident. Healing changes along these fractures are noted. Overlying soft tissue swelling is noted. No new fracture, subluxation or dislocation noted. No definite radiographic evidence of acute osteomyelitis noted. IMPRESSION: Mildly angulated healing fractures of the 4th and 5th metatarsals with soft tissue swelling again noted but without definite radiographic evidence of acute osteomyelitis. Consider MRI for further evaluation as clinically indicated. Electronically Signed   By: Harmon Pier M.D.   On: 10/23/2019 13:40   DG Foot 2 Views Left  Result Date: 10/13/2019 CLINICAL DATA:  49 year old male with diabetic foot. EXAM: LEFT FOOT - 2 VIEW COMPARISON:  Left foot radiograph dated 09/11/2019. FINDINGS: Evaluation is limited due to positioning. Fractures of the midportion of the fourth and fifth metacarpal with interval progression of bridging callus formation. No new fracture identified. There is no dislocation. There is diffuse soft tissue swelling of the foot. No radiopaque foreign object or soft tissue gas. IMPRESSION: 1. Healing fractures of the fourth and fifth metacarpal. 2. Diffuse soft tissue swelling. No radiopaque foreign object or soft tissue gas. Electronically Signed   By: Elgie Collard M.D.   On: 10/13/2019 16:49   EEG adult  Result Date: 10/27/2019 Charlsie Quest,  MD     10/28/2019 10:13 AM Patient Name: LAMERE LIGHTNER MRN: [id number] Epilepsy Attending: Charlsie Quest Referring Physician/Provider: Dr. [name] Date: 10/27/2019 Duration: 22.22 minutes Patient history: 49 year old male with altered mental status. EEG evaluate for seizures. Level of alertness: Awake, asleep AEDs during EEG study: Keppra Technical aspects: This EEG study was done with scalp electrodes positioned according to the 10-20 International system of electrode placement. Electrical activity was acquired at a sampling rate of 500Hz  and reviewed with a high frequency filter of 70Hz  and a low frequency filter of 1Hz . EEG data were recorded continuously and digitally stored. Description: The posterior dominant rhythm consists of 8 Hz activity of moderate voltage (25-35 uV) seen predominantly in posterior head regions, symmetric and reactive to eye opening and eye closing. Sleep was characterized by vertex waves, sleep spindles (12 to 14 Hz), maximal frontocentral region. Frequent runs of sharp waves were seen in frontocentral region, maximal vertex lasting about 5 to 6 seconds consistent with brief ictal-interictal rhythmic discharges. Continuous generalized 5 to 6 Hz theta slowing was also noted. Hyperventilation and photic stimulation were not performed.   ABNORMALITY -Continuous slow, generalized -Brief ictal-interictal rhythmic discharges, bilateral frontocentral region, maximal vertex IMPRESSION: This study showed evidence of potential epileptogenicity arising from bilateral frontocentral, maximal vertex region. The sharp waves are at times rhythmic consistent with brief ictal-interictal rhythmic discharges. Additionally, there is evidence of mild diffuse encephalopathy, nonspecific etiology. Priyanka   Overnight EEG with video  Result Date: 10/28/2019 , MD     10/29/2019  8:27 AM Patient Name: KAZI REPPOND MRN: Charlsie Quest Epilepsy Attending: 12/29/2019 Referring  Physician/Provider: Dr. [name] Duration: 10/27/2019 1145 to 10/28/2019 1145  Patient history: 50 year old male with altered mental status. EEG evaluate for seizures.  Level of alertness: Awake, asleep AEDs during EEG study: Keppra  Technical aspects: This EEG study was done with scalp electrodes positioned according to the 10-20 International system of  electrode placement. Electrical activity was acquired at a sampling rate of 500Hz  and reviewed with a high frequency filter of 70Hz  and a low frequency filter of 1Hz . EEG data were recorded continuously and digitally stored.  Description: The posterior dominant rhythm consists of 8 Hz activity of moderate voltage (25-35 uV) seen predominantly in posterior head regions, symmetric and reactive to eye opening and eye closing. Sleep was characterized by vertex waves, sleep spindles (12 to 14 Hz), maximal frontocentral region. Initially, frequent runs of sharp waves were seen in frontocentral region, maximal vertex lasting about 5 to 6 seconds consistent with brief ictal-interictal rhythmic discharges. Continuous generalized 5 to 6 Hz theta slowing as well as intermittent generalized rhythmic 2-3hz  delta slowing  was also noted. Hyperventilation and photic stimulation were not performed.    ABNORMALITY -Continuous slow, generalized - Intermittent rhythmic delta slowing, generalized -Brief ictal-interictal rhythmic discharges, bilateral frontocentral region, maximal vertex  IMPRESSION: This study initially showed evidence of potential epileptogenicity arising from bilateral frontocentral, maximal vertex region. The sharp waves are at times rhythmic consistent with brief ictal-interictal rhythmic discharges. Additionally, there is evidence of mild diffuse encephalopathy, nonspecific etiology. No seizures were seen during thi study. EEG appears to be improving compared to previous day.  Charlsie Quest   ECHOCARDIOGRAM COMPLETE  Result Date: 10/12/2019     ECHOCARDIOGRAM REPORT   Patient Name:   SARGENT GAPP Date of Exam: 10/12/2019 Medical Rec #:  [id number]      Height:       74.0 in Accession #:    [id number]     Weight:       245.0 lb Date of Birth:  12/17/1970      BSA:          2.369 m Patient Age:    49 years       BP:           111/74 mmHg Patient Gender: M              HR:           75 bpm. Exam Location:  Inpatient Procedure: 2D Echo Indications:    CHF 428                  & Bacteremia 790.7  History:        Patient has no prior history of Echocardiogram examinations.                 CHF; Risk Factors:Hypertension and Diabetes. Covid +.  Sonographer:    Celene Skeen RDCS (AE) Referring Phys: [id number] Clarene Critchley [facility]  Sonographer Comments: Echo performed with patient supine and on artificial respirator. Image acquisition challenging due to respiratory motion and Image acquisition challenging due to patient body habitus. restricted mobility IMPRESSIONS  1. Extremely poor acoustic windows. LVEF appears to be depressed with diffuse hypokinesis, worse in the mid/distal inferior/inferoseptal walls. . Left ventricular ejection fraction, by estimation, is 30 to 35%. The left ventricle has moderately decreased function. The left ventricular internal cavity size was mildly dilated. Left ventricular diastolic parameters are indeterminate.  2. Right ventricular systolic function is moderately reduced. The right ventricular size is mildly enlarged.  3. The mitral valve is normal in structure. Trivial mitral valve regurgitation.  4. The aortic valve is normal in structure. Aortic valve regurgitation is not visualized. FINDINGS  Left Ventricle: Extremely poor acoustic windows. LVEF appears to be depressed with diffuse hypokinesis, worse in the mid/distal inferior/inferoseptal walls. Left ventricular ejection  fraction, by estimation, is 30 to 35%. The left ventricle has moderately decreased function. The left ventricle demonstrates regional wall motion abnormalities. The left  ventricular internal cavity size was mildly dilated. There is no left ventricular hypertrophy. Left ventricular diastolic parameters are indeterminate. Right Ventricle: The right ventricular size is mildly enlarged. Right vetricular wall thickness was not assessed. Right ventricular systolic function is moderately reduced. Left Atrium: Left atrial size was normal in size. Right Atrium: Right atrial size was normal in size. Pericardium: There is no evidence of pericardial effusion. Mitral Valve: The mitral valve is normal in structure. Trivial mitral valve regurgitation. Tricuspid Valve: The tricuspid valve is normal in structure. Tricuspid valve regurgitation is trivial. Aortic Valve: The aortic valve is normal in structure. Aortic valve regurgitation is not visualized. Pulmonic Valve: The pulmonic valve was grossly normal. Pulmonic valve regurgitation is trivial. Aorta: The aortic root is normal in size and structure. IAS/Shunts: The interatrial septum was not assessed.  LEFT VENTRICLE PLAX 2D LVIDd:         5.60 cm  Diastology LVIDs:         3.90 cm  LV e' lateral:   8.81 cm/s LV PW:         1.40 cm  LV E/e' lateral: 5.7 LV IVS:        1.10 cm  LV e' medial:    5.87 cm/s LVOT diam:     2.40 cm  LV E/e' medial:  8.5 LV SV:         64 LV SV Index:   27 LVOT Area:     4.52 cm  RIGHT VENTRICLE RV S prime:     9.79 cm/s TAPSE (M-mode): 1.3 cm LEFT ATRIUM             Index       RIGHT ATRIUM           Index LA diam:        3.10 cm 1.31 cm/m  RA Area:     18.80 cm LA Vol (A2C):   62.9 ml 26.55 ml/m RA Volume:   51.80 ml  21.86 ml/m LA Vol (A4C):   40.8 ml 17.22 ml/m LA Biplane Vol: 50.8 ml 21.44 ml/m  AORTIC VALVE LVOT Vmax:   69.80 cm/s LVOT Vmean:  49.400 cm/s LVOT VTI:    0.142 m  AORTA Ao Root diam: 3.60 cm MITRAL VALVE MV Area (PHT): 2.34 cm    SHUNTS MV Decel Time: 324 msec    Systemic VTI:  0.14 m MV E velocity: 50.10 cm/s  Systemic Diam: 2.40 cm MV A velocity: 71.60 cm/s MV E/A ratio:  0.70 Dietrich Pates MD  Electronically signed by Dietrich Pates MD Signature Date/Time: 10/12/2019/1:58:41 PM    Final    ECHO TEE  Result Date: 10/24/2019    TRANSESOPHOGEAL ECHO REPORT   Patient Name:   [name] Date of Exam: 10/24/2019 Medical Rec #:  [id number]      Height:       74.0 in Accession #:    [id number]     Weight:       247.4 lb Date of Birth:  [birth year]      BSA:          2.379 m Patient Age:    49 years       BP:           120/80 mmHg Patient Gender: M  HR:           100 bpm. Exam Location:  Inpatient Procedure: Transesophageal Echo Indications:    Emobli/Bacteremia  History:        Patient has prior history of Echocardiogram examinations.  Sonographer:    Ross Ludwig RDCS (AE) Referring Phys: [id number] HAO MENG PROCEDURE: The transesophogeal probe was passed without difficulty through the esophogus of the patient. Sedation performed by different physician. The patient's vital signs; including heart rate, blood pressure, and oxygen saturation; remained stable throughout the procedure. The patient developed no complications during the procedure. IMPRESSIONS  1. Left ventricular ejection fraction, by estimation, is 50 to 55%. The left ventricle has low normal function. The left ventricle has no regional wall motion abnormalities.  2. Right ventricular systolic function is normal. The right ventricular size is normal.  3. No left atrial/left atrial appendage thrombus was detected.  4. The mitral valve is normal in structure. Trivial mitral valve regurgitation. No evidence of mitral stenosis.  5. The aortic valve is normal in structure. Aortic valve regurgitation is not visualized. No aortic stenosis is present.  6. The inferior vena cava is normal in size with greater than 50% respiratory variability, suggesting right atrial pressure of 3 mmHg. Conclusion(s)/Recommendation(s): Normal biventricular function without evidence of hemodynamically significant valvular heart disease. FINDINGS  Left Ventricle: Left  ventricular ejection fraction, by estimation, is 50 to 55%. The left ventricle has low normal function. The left ventricle has no regional wall motion abnormalities. The left ventricular internal cavity size was normal in size. There is no left ventricular hypertrophy. Right Ventricle: The right ventricular size is normal. No increase in right ventricular wall thickness. Right ventricular systolic function is normal. Left Atrium: Left atrial size was normal in size. No left atrial/left atrial appendage thrombus was detected. Right Atrium: Right atrial size was normal in size. Pericardium: There is no evidence of pericardial effusion. Mitral Valve: The mitral valve is normal in structure. Normal mobility of the mitral valve leaflets. Trivial mitral valve regurgitation. No evidence of mitral valve stenosis. There is no evidence of mitral valve vegetation. Tricuspid Valve: The tricuspid valve is normal in structure. Tricuspid valve regurgitation is mild . No evidence of tricuspid stenosis. There is no evidence of tricuspid valve vegetation. Aortic Valve: The aortic valve is normal in structure. Aortic valve regurgitation is not visualized. No aortic stenosis is present. There is no evidence of aortic valve vegetation. Pulmonic Valve: The pulmonic valve was normal in structure. Pulmonic valve regurgitation is trivial. No evidence of pulmonic stenosis. Aorta: The aortic root is normal in size and structure. Venous: The inferior vena cava is normal in size with greater than 50% respiratory variability, suggesting right atrial pressure of 3 mmHg. IAS/Shunts: No atrial level shunt detected by color flow Doppler. [name] MD Electronically signed by [name] MD Signature Date/Time: 10/24/2019/3:28:45 PM    Final    VAS US CAROTID (at [facility] and [facility] only)  Result Date: 10/26/2019 Carotid Arterial Duplex Study Indications:       17mm focus of diffusion restriction in the left                    frontoparietal lobe  concerning for acute/early subacute                    infarct vs cerebritis. Risk Factors:      Hypertension, Diabetes. Other Factors:     Covid-19, bacteremia. Comparison Study:  No prior study  on file Performing Technologist: Sherren Kerns RVS  Examination Guidelines: A complete evaluation includes B-mode imaging, spectral Doppler, color Doppler, and power Doppler as needed of all accessible portions of each vessel. Bilateral testing is considered an integral part of a complete examination. Limited examinations for reoccurring indications may be performed as noted.  Right Carotid Findings: +----------+--------+--------+--------+------------------+--------+           PSV cm/sEDV cm/sStenosisPlaque DescriptionComments +----------+--------+--------+--------+------------------+--------+ CCA Prox  66      11                                         +----------+--------+--------+--------+------------------+--------+ CCA Distal71      18                                         +----------+--------+--------+--------+------------------+--------+ ICA Prox  58      19                                         +----------+--------+--------+--------+------------------+--------+ ICA Distal90      33                                         +----------+--------+--------+--------+------------------+--------+ ECA       114     16                                         +----------+--------+--------+--------+------------------+--------+ +----------+--------+-------+--------+-------------------+           PSV cm/sEDV cmsDescribeArm Pressure (mmHG) +----------+--------+-------+--------+-------------------+ [id number]                                        +----------+--------+-------+--------+-------------------+ +---------+--------+--+--------+--+ VertebralPSV cm/s38EDV cm/s11 +---------+--------+--+--------+--+  Left Carotid Findings:  +----------+--------+--------+--------+------------------+--------+           PSV cm/sEDV cm/sStenosisPlaque DescriptionComments +----------+--------+--------+--------+------------------+--------+ CCA Prox  55      14                                         +----------+--------+--------+--------+------------------+--------+ CCA Distal65      20                                         +----------+--------+--------+--------+------------------+--------+ ICA Prox  68      20                                         +----------+--------+--------+--------+------------------+--------+ ICA Distal76      23                                         +----------+--------+--------+--------+------------------+--------+  ECA       77      3                                          +----------+--------+--------+--------+------------------+--------+ +----------+--------+--------+--------+-------------------+           PSV cm/sEDV cm/sDescribeArm Pressure (mmHG) +----------+--------+--------+--------+-------------------+ [id number]                                          +----------+--------+--------+--------+-------------------+ +---------+--------+--+--------+--+ VertebralPSV cm/s63EDV cm/s18 +---------+--------+--+--------+--+   Summary: Right Carotid: The extracranial vessels were near-normal with only minimal wall                thickening or plaque. Left Carotid: The extracranial vessels were near-normal with only minimal wall               thickening or plaque. Vertebrals:  Bilateral vertebral arteries demonstrate antegrade flow. Subclavians: Normal flow hemodynamics were seen in bilateral subclavian              arteries. *See table(s) above for measurements and observations.  Electronically signed by Delia Heady MD on 10/26/2019 at 12:37:48 PM.    Final    VAS [place] LOWER EXTREMITY ARTERIAL DUPLEX  Result Date: 10/26/2019 LOWER EXTREMITY ARTERIAL DUPLEX STUDY  Indications: Ulceration, and Osteomyelitis. High Risk Factors: Hypertension, Diabetes. Other Factors: Covid-19. Charcot foot.  Current ABI: N/A Comparison Study: No prior study Performing Technologist: Sherren Kerns RVS  Examination Guidelines: A complete evaluation includes B-mode imaging, spectral Doppler, color Doppler, and power Doppler as needed of all accessible portions of each vessel. Bilateral testing is considered an integral part of a complete examination. Limited examinations for reoccurring indications may be performed as noted.  +----------+--------+-----+--------+---------+--------+ LEFT      PSV cm/sRatioStenosisWaveform Comments +----------+--------+-----+--------+---------+--------+ CFA Prox  123                  triphasic         +----------+--------+-----+--------+---------+--------+ DFA       51                   triphasic         +----------+--------+-----+--------+---------+--------+ SFA Prox  97                   triphasic         +----------+--------+-----+--------+---------+--------+ SFA Mid   88                   triphasic         +----------+--------+-----+--------+---------+--------+ SFA Distal81                   triphasic         +----------+--------+-----+--------+---------+--------+ POP Prox  60                   triphasic         +----------+--------+-----+--------+---------+--------+ POP Distal86                   triphasic         +----------+--------+-----+--------+---------+--------+ ATA Distal110                  triphasic         +----------+--------+-----+--------+---------+--------+ PTA Prox  34  triphasic         +----------+--------+-----+--------+---------+--------+ PTA Mid   29                   triphasic         +----------+--------+-----+--------+---------+--------+ PTA Distal125                  triphasic         +----------+--------+-----+--------+---------+--------+   Summary: Left: Normal waveforms and adequate flow noted.  See table(s) above for measurements and observations. Electronically signed by Lemar Livings MD on 10/26/2019 at 4:50:14 PM.    Final    VAS [place] LOWER EXTREMITY ARTERIAL DUPLEX  Result Date: 10/19/2019 LOWER EXTREMITY ARTERIAL DUPLEX STUDY Indications: Cold leg, Covid-19.  Current ABI: N/A Comparison Study: No prior study Performing Technologist: Sherren Kerns RVS  Examination Guidelines: A complete evaluation includes B-mode imaging, spectral Doppler, color Doppler, and power Doppler as needed of all accessible portions of each vessel. Bilateral testing is considered an integral part of a complete examination. Limited examinations for reoccurring indications may be performed as noted.  +-----------+--------+-----+--------+---------+--------+ RIGHT      PSV cm/sRatioStenosisWaveform Comments +-----------+--------+-----+--------+---------+--------+ CFA Prox   67                   triphasic         +-----------+--------+-----+--------+---------+--------+ DFA        60                   triphasic         +-----------+--------+-----+--------+---------+--------+ SFA Prox   68                   triphasic         +-----------+--------+-----+--------+---------+--------+ SFA Mid    62                   triphasic         +-----------+--------+-----+--------+---------+--------+ SFA Distal 69                   triphasic         +-----------+--------+-----+--------+---------+--------+ POP Prox   47                   triphasic         +-----------+--------+-----+--------+---------+--------+ POP Distal 42                   triphasic         +-----------+--------+-----+--------+---------+--------+ ATA Distal 15                   triphasic         +-----------+--------+-----+--------+---------+--------+ PTA Prox   31                   triphasic          +-----------+--------+-----+--------+---------+--------+ PTA Mid    38                   triphasic         +-----------+--------+-----+--------+---------+--------+ PTA Distal 38                   triphasic         +-----------+--------+-----+--------+---------+--------+ PERO Distal36                   triphasic         +-----------+--------+-----+--------+---------+--------+  Summary: Right: Normal examination. No evidence of arterial  occlusive disease. Normal waveforms, no stenosis.  See table(s) above for measurements and observations. Electronically signed by Sherald Hess MD on 10/19/2019 at 3:25:02 PM.    Final    VAS [place] LOWER EXTREMITY VENOUS (DVT)  Result Date: 10/26/2019  Lower Venous DVTStudy Indications: Covid-19, elevated D-Dimer.  Comparison Study: Prior negative left lower extremity venous duplex don 03/19/17                   and is available for comparison. Performing Technologist: Sherren Kerns RVS  Examination Guidelines: A complete evaluation includes B-mode imaging, spectral Doppler, color Doppler, and power Doppler as needed of all accessible portions of each vessel. Bilateral testing is considered an integral part of a complete examination. Limited examinations for reoccurring indications may be performed as noted. The reflux portion of the exam is performed with the patient in reverse Trendelenburg.  +---------+---------------+---------+-----------+----------+--------------+ RIGHT    CompressibilityPhasicitySpontaneityPropertiesThrombus Aging +---------+---------------+---------+-----------+----------+--------------+ CFV      Full           Yes      Yes                                 +---------+---------------+---------+-----------+----------+--------------+ SFJ      Full                                                        +---------+---------------+---------+-----------+----------+--------------+ FV Prox  Full                                                         +---------+---------------+---------+-----------+----------+--------------+ FV Mid   Full                                                        +---------+---------------+---------+-----------+----------+--------------+ FV DistalFull                                                        +---------+---------------+---------+-----------+----------+--------------+ PFV      Full                                                        +---------+---------------+---------+-----------+----------+--------------+ POP      Partial        Yes      Yes                  Acute          +---------+---------------+---------+-----------+----------+--------------+ PTV      Full                                                        +---------+---------------+---------+-----------+----------+--------------+  PERO     Full                                                        +---------+---------------+---------+-----------+----------+--------------+   +---------+---------------+---------+-----------+----------+--------------+ LEFT     CompressibilityPhasicitySpontaneityPropertiesThrombus Aging +---------+---------------+---------+-----------+----------+--------------+ CFV      Full           Yes      Yes                                 +---------+---------------+---------+-----------+----------+--------------+ SFJ      Full                                                        +---------+---------------+---------+-----------+----------+--------------+ FV Prox  Full                                                        +---------+---------------+---------+-----------+----------+--------------+ FV Mid   Full                                                        +---------+---------------+---------+-----------+----------+--------------+ FV DistalFull                                                         +---------+---------------+---------+-----------+----------+--------------+ PFV      Full                                                        +---------+---------------+---------+-----------+----------+--------------+ POP      Full           Yes      Yes                                 +---------+---------------+---------+-----------+----------+--------------+ PTV      Full                                                        +---------+---------------+---------+-----------+----------+--------------+ PERO     Full                                                        +---------+---------------+---------+-----------+----------+--------------+  Summary: RIGHT: - Findings consistent with acute deep vein thrombosis involving the right popliteal vein. - Ultrasound characteristics of enlarged lymph nodes are noted in the groin.  LEFT: - There is no evidence of deep vein thrombosis in the lower extremity.  *See table(s) above for measurements and observations. Electronically signed by Lemar Livings MD on 10/26/2019 at 4:50:27 PM.    Final    [place] EKG SITE RITE  Result Date: 10/12/2019 If Site Rite image not attached, placement could not be confirmed due to current cardiac rhythm.

## 2019-11-09 ENCOUNTER — Inpatient Hospital Stay (HOSPITAL_COMMUNITY): Payer: Medicaid Other

## 2019-11-09 LAB — URINALYSIS, ROUTINE W REFLEX MICROSCOPIC
Bilirubin Urine: NEGATIVE
Glucose, UA: NEGATIVE mg/dL
Hgb urine dipstick: NEGATIVE
Ketones, ur: NEGATIVE mg/dL
Leukocytes,Ua: NEGATIVE
Nitrite: NEGATIVE
Protein, ur: NEGATIVE mg/dL
Specific Gravity, Urine: 1.003 — ABNORMAL LOW (ref 1.005–1.030)
pH: 7 (ref 5.0–8.0)

## 2019-11-09 LAB — GLUCOSE, CAPILLARY
Glucose-Capillary: 137 mg/dL — ABNORMAL HIGH (ref 70–99)
Glucose-Capillary: 138 mg/dL — ABNORMAL HIGH (ref 70–99)
Glucose-Capillary: 172 mg/dL — ABNORMAL HIGH (ref 70–99)
Glucose-Capillary: 180 mg/dL — ABNORMAL HIGH (ref 70–99)
Glucose-Capillary: 193 mg/dL — ABNORMAL HIGH (ref 70–99)
Glucose-Capillary: 236 mg/dL — ABNORMAL HIGH (ref 70–99)

## 2019-11-09 LAB — BASIC METABOLIC PANEL
Anion gap: 10 (ref 5–15)
BUN: 6 mg/dL (ref 6–20)
CO2: 26 mmol/L (ref 22–32)
Calcium: 8.7 mg/dL — ABNORMAL LOW (ref 8.9–10.3)
Chloride: 97 mmol/L — ABNORMAL LOW (ref 98–111)
Creatinine, Ser: 0.71 mg/dL (ref 0.61–1.24)
GFR calc Af Amer: >60 mL/min (ref >60)
GFR calc non Af Amer: >60 mL/min (ref >60)
Glucose, Bld: 139 mg/dL — ABNORMAL HIGH (ref 70–99)
Potassium: 3.7 mmol/L (ref 3.5–5.1)
Sodium: 133 mmol/L — ABNORMAL LOW (ref 135–145)

## 2019-11-09 LAB — BRAIN NATRIURETIC PEPTIDE: B Natriuretic Peptide: 28.8 pg/mL (ref 0.0–100.0)

## 2019-11-09 LAB — MAGNESIUM: Magnesium: 1.6 mg/dL — ABNORMAL LOW (ref 1.7–2.4)

## 2019-11-09 IMAGING — DX DG CHEST 1V PORT
1 series · 1 of 1 positions shown · non-contrast
Comparison: [DATE]

CLINICAL DATA: Shortness of breath and cough

EXAM:
PORTABLE CHEST 1 VIEW

[chest ap]
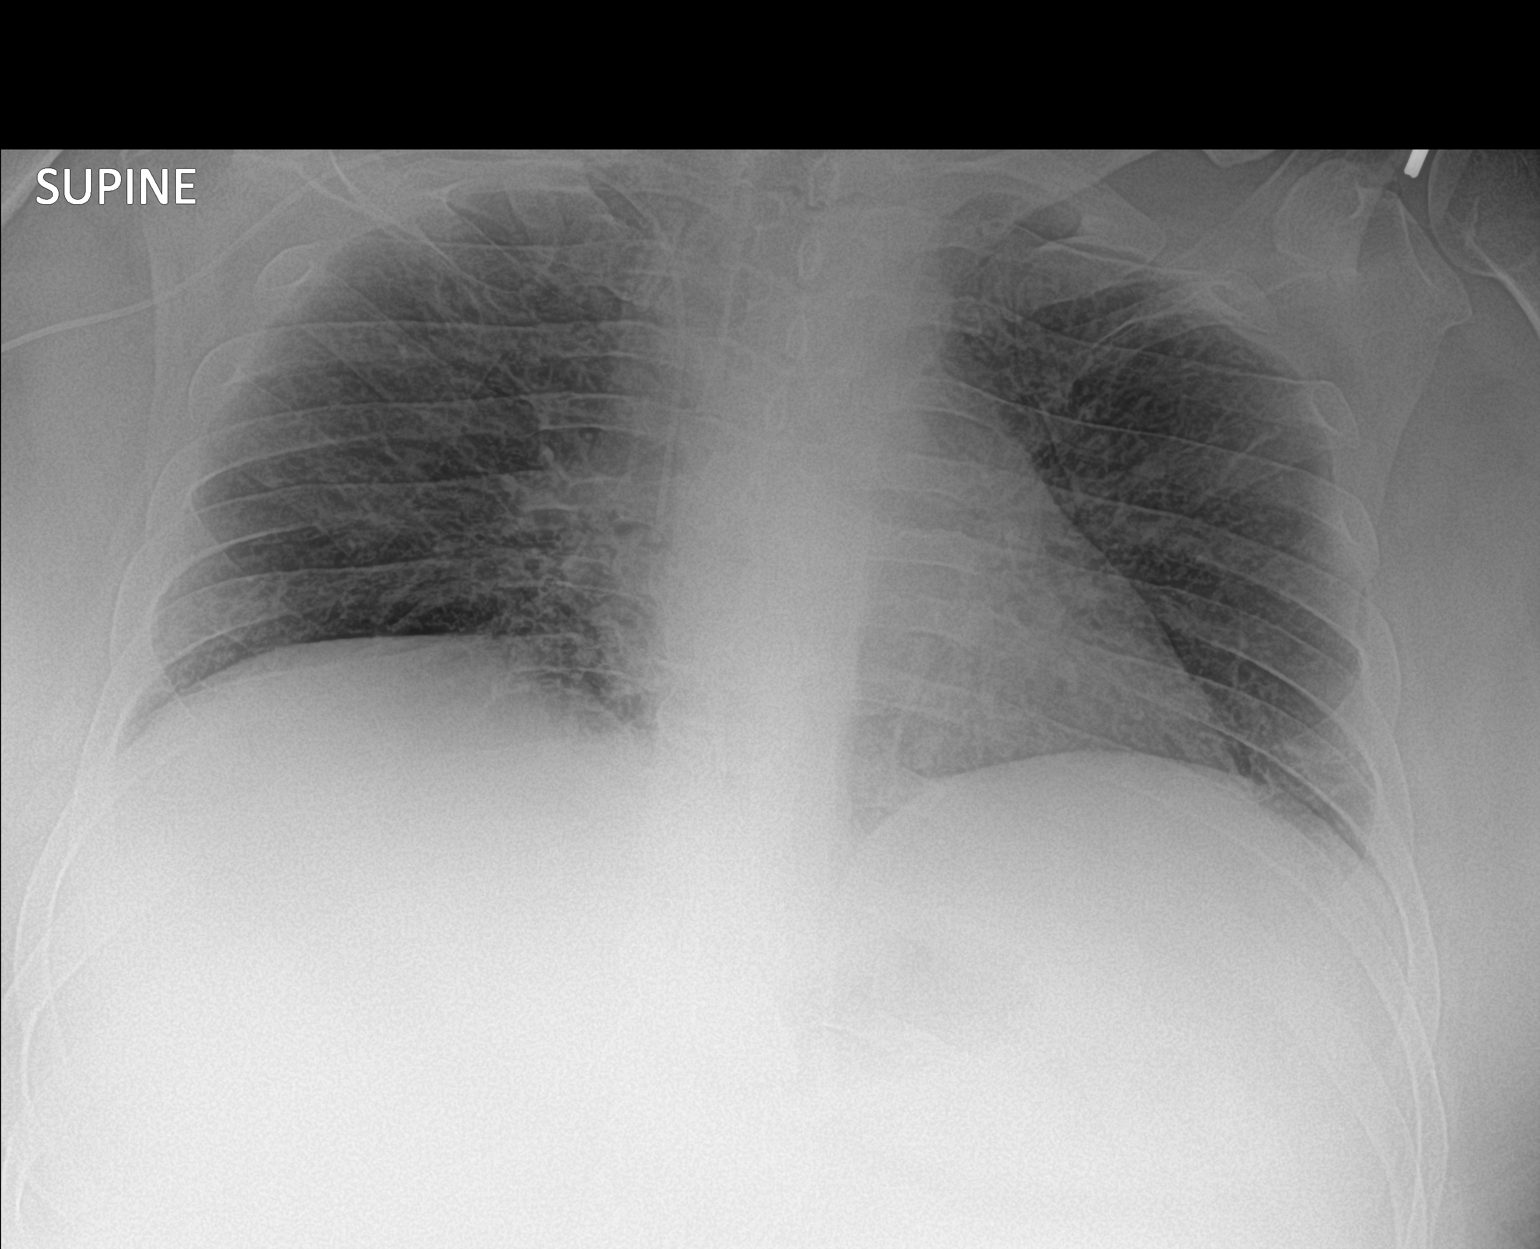

[1 of 1 positions shown; findings below may reference images not displayed]

FINDINGS: Central catheter tip is in the superior vena cava. No pneumothorax.
There is no edema or airspace opacity. Heart size and pulmonary
vascularity are normal. No adenopathy. No bone lesions.
IMPRESSION: Central catheter tip in superior vena cava. No pneumothorax. Lungs
clear. Cardiac silhouette normal.

## 2019-11-09 MED ORDER — MENTHOL 3 MG MT LOZG: 1.0000 | LOZENGE | OROMUCOSAL | Status: DC | PRN

## 2019-11-09 MED ORDER — FAMOTIDINE 10 MG PO TABS
10.0000 mg | ORAL_TABLET | Freq: Every day | ORAL | Status: DC
  Administered 2019-11-09 – 2019-11-11 (×3): 10 mg via ORAL
  Filled 2019-11-09 (×3): qty 1

## 2019-11-09 MED ORDER — POTASSIUM CHLORIDE CRYS ER 20 MEQ PO TBCR
20.0000 meq | EXTENDED_RELEASE_TABLET | Freq: Once | ORAL | Status: AC
  Administered 2019-11-09: 20 meq via ORAL
  Filled 2019-11-09: qty 1

## 2019-11-09 MED ORDER — MAGNESIUM SULFATE 2 GM/50ML IV SOLN
2.0000 g | Freq: Once | INTRAVENOUS | Status: AC
  Administered 2019-11-09: 2 g via INTRAVENOUS
  Filled 2019-11-09: qty 50

## 2019-11-09 MED ORDER — MAGNESIUM SULFATE 4 GM/100ML IV SOLN
4.0000 g | Freq: Once | INTRAVENOUS | Status: AC
  Administered 2019-11-09: 4 g via INTRAVENOUS
  Filled 2019-11-09: qty 100

## 2019-11-09 MED ORDER — FUROSEMIDE 40 MG PO TABS
40.0000 mg | ORAL_TABLET | Freq: Once | ORAL | Status: AC
  Administered 2019-11-09: 40 mg via ORAL
  Filled 2019-11-09: qty 1

## 2019-11-09 NOTE — Progress Notes (Addendum)
Inpatient Rehab Admissions Coordinator:   I met with patient at bedside to notify him that I do not have a CIR bed available for this pt. Today. I will also call to confirm Pt. Has an active Medicaid policy this afternoon. Will continue to follow for potential admit this week, pending bed availability.   Clemens Catholic, [place], [place] Admissions Coordinator  [phone] ([place]) [phone] (office)

## 2019-11-09 NOTE — Progress Notes (Addendum)
Inpatient Rehab Admissions Coordinator:   I met with Pt. And his mother at bedside to notify him that while he does now have an active medicaid plain, it is a managed Medicaid plan ([place] [employer] [place]) that CIR does not contract with. Pt. Remains interested in inpatient rehab and I have reached out to case manager who will check with other facilities to see which ones  Accept  Pt.'s insurance. Pt. States preference for [facility] for IPR or [facility] if SNF is his only option. CIR will sign off.   Clemens Catholic, [place], [place] Admissions Coordinator  [phone] ([place]) [phone] (office)

## 2019-11-09 NOTE — Care Management (Signed)
Entered a benefit check to see if there is a Inpatient rehab in network with patient's insurance.  Ronny Flurry RN

## 2019-11-09 NOTE — Progress Notes (Signed)
Physical Therapy Treatment Patient Details Name: Micheal Bell MRN: [id number] DOB: [birth year] Today's Date: 11/09/2019    History of Present Illness Pt is a 49 y.o. male admitted 10/09/19 for COVID PNA. Pt decompensated 8/19 and intubated; self extubated 8/23 and reintubated; extubated 8/28. Course complicated by MSSA and GBS bacteria, sepsis, systolic HF, acute metabolic encephalopathy. 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 TEE without evidence of vegitation. 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    PT Comments    Pt is up to walk with boot on LLE and brace on R knee, improving his transfers today to min assist initially.  He is still unable to walk alone given the instability on R knee.  He is impulsive, tends to want to walk in a more independent manner and will less help than is warranted.  Follow acutely to reinforce safety and work on strength and standing balance as tolerated.   Follow Up Recommendations  CIR     Equipment Recommendations  None recommended by PT    Recommendations for Other Services       Precautions / Restrictions Precautions Precautions: Fall Precaution Comments: Charcot foot LLE with wound Required Braces or Orthoses: Other Brace Other Brace: L cam boot and R knee sleeve with stays Restrictions Weight Bearing Restrictions: No    Mobility  Bed Mobility Overal bed mobility: Needs Assistance Bed Mobility: Supine to Sit Rolling: Min assist   Supine to sit: Min assist Sit to supine: Min assist      Transfers Overall transfer level: Needs assistance Equipment used: Rolling walker (2 wheeled);1 person hand held assist Transfers: Sit to/from Stand Sit to Stand: Min assist;Mod  assist            Ambulation/Gait Ambulation/Gait assistance: Min Chemical engineer (Feet): 24 Feet Assistive device: Rolling walker (2 wheeled) Gait Pattern/deviations: Step-to pattern;Wide base of support;Decreased stride length Gait velocity: reduced Gait velocity interpretation: <1.8 ft/sec, indicate of risk for recurrent falls General Gait Details: sidesteps with two incidents of R knee buckling   Stairs             Wheelchair Mobility    Modified Rankin (Stroke Patients Only)       Balance Overall balance assessment: Needs assistance Sitting-balance support: Feet supported Sitting balance-Leahy Scale: Fair     Standing balance support: Bilateral upper extremity supported;During functional activity Standing balance-Leahy Scale: Poor Standing balance comment: requires RW for support of R knee                            Cognition Arousal/Alertness: Awake/alert Behavior During Therapy: WFL for tasks assessed/performed Overall Cognitive Status: Impaired/Different from baseline                   Orientation Level: Situation Current Attention Level: Selective Memory: Decreased recall of precautions Following Commands: Follows one step commands with increased time;Follows one step commands inconsistently Safety/Judgement: Decreased awareness of deficits Awareness: Intellectual Problem Solving: Slow processing;Requires verbal cues;Requires tactile cues General Comments: pt feels he is able to do more than he can      Exercises      General Comments General comments (skin integrity, edema, etc.): Pt has limited ability to stand, requiring help and  elevation of bed to power up, but is able to get up from higher surface initially with min assist      Pertinent Vitals/Pain Pain Assessment: No/denies pain    Home Living                      Prior Function            PT Goals (current goals can now be found in the care  plan section) Acute Rehab PT Goals Patient Stated Goal: get home with his family Progress towards PT goals: Progressing toward goals    Frequency    Min 4X/week      PT Plan Frequency needs to be updated    Co-evaluation              AM-PAC PT "6 Clicks" Mobility   Outcome Measure  Help needed turning from your back to your side while in a flat bed without using bedrails?: None Help needed moving from lying on your back to sitting on the side of a flat bed without using bedrails?: A Little Help needed moving to and from a bed to a chair (including a wheelchair)?: A Lot Help needed standing up from a chair using your arms (e.g., wheelchair or bedside chair)?: A Lot Help needed to walk in hospital room?: A Lot Help needed climbing 3-5 steps with a railing? : A Lot 6 Click Score: 15    End of Session Equipment Utilized During Treatment: Gait belt Activity Tolerance: Patient tolerated treatment well Patient left: with call bell/phone within reach;in bed (side of bed) Nurse Communication: Mobility status PT Visit Diagnosis: Other abnormalities of gait and mobility (R26.89);Muscle weakness (generalized) (M62.81);Other symptoms and signs involving the nervous system (R29.898)     Time: 4627-0350 PT Time Calculation (min) (ACUTE ONLY): 37 min  Charges:  $Gait Training: 8-22 mins $Therapeutic Activity: 8-22 mins                 Ivar Drape 11/09/2019, 10:01 PM  Samul Dada, PT MS Acute Rehab Dept. Number: [facility] [id number] and [facility] [phone]

## 2019-11-09 NOTE — Progress Notes (Signed)
PROGRESS NOTE                                                                                                                                                                                                             Patient Demographics:    Micheal Bell, is a 49 y.o. male, DOB - [birth year], GUR:[id number]  Outpatient Primary MD for the patient is [name], MD    LOS - 28  Admit date - 10/09/2019    Chief Complaint  Patient presents with   Cough       Brief Narrative - 49 yo male with hx of HTN, T2DM, gout, IBS, fibromyalgia, obesity who presented on 8/16 for COVID pneumonia.  He decompensated on 8/19 and was intubated.  Self extubated on 8/23 and was reintubated.  He's subsequently been extubated on 8/28.  He's been on typical therapies for covid including steroids, baricitinib and remdesivir.  His hospitalization was complicated by MSSA and GBS bacteria.  Also found to have systolic heart failure.    Subjective:   Patient in bed, appears comfortable, denies any headache, no fever, no chest pain or pressure, no shortness of breath much improved cough, no abdominal pain. No focal weakness.   Assessment  & Plan :   Acute Hypoxic Resp. Failure due to Acute Covid 19 Viral Pneumonitis during the ongoing 2020 Covid 19 Pandemic - he had severe hypoxia and parenchymal lung injury he was intubated and admitted by ICU, he was started on IV steroids, remdesivir and Baricitinib on 10/12/2019.  He was subsequently extubated on 10/21/2019.  His stay was complicated by bacteremia after which Baricitinib was discontinued on 10/24/2019.  His pulmonary status is gradually improving, currently he is stable and symptom-free, he is on room air, I have discussed with him about incentive spirometry, flutter valve, pulmonary toiletry, he is now off of isolation and his pulmonary issues from COVID-19 is clinically resolved.   Recent Labs  Lab  11/03/19 0356 11/05/19 0356 11/05/19 0357 11/06/19 0339 11/07/19 0350 11/08/19 0450 11/08/19 1144 11/09/19 0430  WBC 8.6 9.6  --  9.7 8.1 7.7  --   --   PLT 328 353  --  354 305 297  --   --   CRP  --  9.8*  --  11.4* 9.8* 10.4*  --   --  BNP  --   --  36.0  --   --   --  36.1 28.8  DDIMER  --  5.95*  --   --   --   --   --   --   PROCALCITON  --  <0.10  --   --   --   --   --   --   AST 17 25  --  --   --   ALT 17 17  --  --   --   ALKPHOS 85 84  --  89 80 83  --   --   BILITOT 0.8 0.7  --  0.7 0.8 0.9  --   --   ALBUMIN 2.1* 2.2*  --  2.3* 2.1* 2.2*  --   --     Sepsis with MSSA and Group B strep bacteremia due to left foot abscess.  ID following, was on IV Ancef but switch to nafcillin on 10/26/2019.  Clean TEE, history of Charcot joints, peripheral neuropathy and recent left foot soft tissue injury, MRI left foot shows an abscess, Ortho was consulted and they recommended medical management with outpatient follow-up with Dr. [name] post discharge, also has osteomyelitis, defer duration of treatment with antibiotics to ID.  Antibiotics management per ID, plan to continue total of 4 weeks of IV nafcillin, through 9/29, then start Keflex 500 mg 4 times daily, he has a follow-up arranged with Dr. [name] on October 13 at 10:30 AM .   Mild dry cough on 11/08/2019.  X-ray with mild fluid overload, likely acute on chronic diastolic CHF, trial of Lasix and monitor.   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  For seizures he has been started on Keppra with good effect and his mentation has improved considerably. Continue PT OT and speech, SNF  VS CIR .  Neuro has seen the patient this admission.   Possible systolic heart failure as shown by TTE however this could have been transient due to sepsis, on repeat TEE his EF is now normalized to 50 to 55%.  He is currently compensated, blood pressure was too low and after hydration has improved, continue beta-blocker.   Cyanotic right lower extremity toes.  Likely due to hypoperfusion which is transient, arterial ultrasound stable. Resolved.   History of chronic pain.  On multiple narcotics, Flexeril and benzodiazepines.  Was off of these medications for several days without any discomfort, reintroduced on his request at very low doses.  Do not escalate.  HTN - BP was low on multiple blood pressure medications, was hydrated now able to tolerate low-dose beta-blocker.  Acute DVT in the right lower extremity.  Switched to full dose Lovenox will switch to Eliquis on 11/07/2019.   Hypomagnesemia.  Persistently low despite aggressive treatment, discussed with nephrologist Dr. Melanee Spry, will obtain 24-hour urinary magnesium and continue to replace as needed.  DM type II.  Currently on Lantus and sliding scale monitor.  Lab Results  Component Value Date   HGBA1C 11.3 (H) 10/26/2019   CBG (last 3)  Recent Labs    11/08/19  2326 11/09/19 0355 11/09/19 0736  GLUCAP 159* 137* 172*      Condition -   Guarded  Family Communication  :  Sister and mother [phone] - 10/25/19, 10/27/19, 10/28/19, 11/03/19  Code Status :  Full  Consults  :  PCCM, ID, Cards  Procedures  :    EEG - This study showed evidence of potential epileptogenicity arising from bilateral frontocentral, maximal vertex region. The sharp waves are at times rhythmic consistent with brief ictal-interictal rhythmic discharges. Additionally, there is evidence of mild diffuse encephalopathy, nonspecific etiology.  Bilateral lower extremity venous ultrasound - Acute right lower extremity popliteal vein DVT.  Carotid duplex.   Nonacute.    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  CT Head -  1. No acute intracranial abnormality. 2. Area of subcortical low-density involving the posterior left frontal lobe at the convexity, likely encephalomalacia and sequela of remote infarct. There are no prior exams for comparison to establish chronicity. MRI could be considered for further evaluation based on clinical concern. 3. Paranasal sinus fluid levels on opacification of the right greater than left mastoid air cells, possibly related to intubation.  R. Leg Arterial US -  No evidence of arterial occlusive disease. Normal waveforms, no stenosis  TTE -  1. Extremely poor acoustic windows. LVEF appears to be depressed with diffuse hypokinesis, worse in the mid/distal inferior/inferoseptal walls. . Left ventricular ejection fraction, by estimation, is 30 to 35%. The left ventricle has moderately decreased function. The left ventricular internal cavity size was mildly dilated. Left ventricular diastolic parameters are indeterminate.  2. Right ventricular systolic function is moderately reduced. The right ventricular size is mildly enlarged.  3. The mitral valve is normal in structure. Trivial mitral valve regurgitation.  4. The aortic valve is normal in structure. Aortic valve regurgitation is not visualized  TEE - 1. Left ventricular ejection fraction, by estimation, is 50 to 55%. The left ventricle has low normal function. The left ventricle has no regional wall motion abnormalities.  2. Right ventricular systolic function is normal. The right ventricular size is normal.  3. No left atrial/left atrial appendage thrombus was detected.  4. The mitral valve is normal in structure. Trivial mitral valve regurgitation. No evidence of mitral stenosis.  5. The aortic valve is normal in structure. Aortic valve regurgitation is not visualized. No aortic  stenosis is present.  6. The inferior vena cava is normal in size with greater than 50% respiratory variability, suggesting right atrial pressure of 3 mmHg.  CT - 1. No acute intracranial abnormality. 2. Area of subcortical low-density involving the posterior left frontal lobe at the convexity, likely encephalomalacia and sequela of remote infarct. There are no prior exams for comparison to establish chronicity. MRI could be considered for further evaluation based on clinical concern. 3. Paranasal sinus fluid levels on opacification of the right greater than left mastoid air cells, possibly related to intubation    PUD Prophylaxis : PPI  Disposition Plan  :    Status is: Inpatient  Remains inpatient appropriate because:IV treatments appropriate due to intensity of illness or inability to take PO   Dispo: The patient is from: Home              Anticipated d/c is to: Home              Anticipated d/c date is: 1 day  Patient currently is medically stable to d/c. to CIR when bed is available.   DVT Prophylaxis  :  Lovenox    Lab Results  Component Value Date   PLT 297 11/08/2019    Diet :  Diet Order            Diet heart healthy/carb modified Room service appropriate? No; Fluid consistency: Thin  Diet effective now                  Inpatient Medications  Scheduled Meds:  alteplase  2 mg Intracatheter Once   apixaban  5 mg Oral BID   atorvastatin  40 mg Oral Daily   buPROPion  150 mg Oral Daily   Chlorhexidine Gluconate Cloth  6 each Topical Daily   docusate sodium  100 mg Oral BID   famotidine  10 mg Oral Daily   insulin aspart  0-15 Units Subcutaneous Q4H   insulin glargine  20 Units Subcutaneous Daily   levETIRAcetam  500 mg Oral BID   magnesium oxide  800 mg Oral BID   melatonin  3 mg Oral QHS   metoprolol tartrate  50 mg Oral BID   multivitamin with minerals  1 tablet Oral Daily   polyethylene glycol  17 g Oral Daily   Ensure Max  Protein  11 oz Oral QHS   senna  2 tablet Oral BID   sodium chloride flush  10-40 mL Intracatheter Q12H   Continuous Infusions:  sodium chloride 10 mL/hr at 11/02/19 0600   magnesium sulfate bolus IVPB     magnesium sulfate bolus IVPB 4 g (11/09/19 0900)   nafcillin (NAFCIL) continuous infusion 12 g (11/08/19 1740)   PRN Meds:.sodium chloride, acetaminophen, bisacodyl, cyclobenzaprine, HYDROcodone-acetaminophen, Ipratropium-Albuterol, menthol-cetylpyridinium, zolpidem  Antibiotics  :    Anti-infectives (From admission, onward)   Start     Dose/Rate Route Frequency Ordered Stop   10/31/19 1415  nafcillin 12 g in sodium chloride 0.9 % 500 mL continuous infusion  Status:  Discontinued        12 g 20.8 mL/hr over 24 Hours Intravenous Every 24 hours 10/31/19 0608 10/31/19 0609   10/29/19 1415  nafcillin 12 g in dextrose 5 % 500 mL IVPB  Status:  Discontinued        12 g 20.8 mL/hr over 24 Hours Intravenous Every 24 hours 10/29/19 0434 10/29/19 0507   10/29/19 1415  nafcillin 12 g in sodium chloride 0.9 % 500 mL continuous infusion        12 g 20.8 mL/hr over 24 Hours Intravenous Every 24 hours 10/29/19 0507 11/22/19 2359   10/26/19 1415  nafcillin 12 g in dextrose 5 % 500 mL IVPB  Status:  Discontinued        12 g 20.8 mL/hr over 24 Hours Intravenous Every 24 hours 10/26/19 1414 10/29/19 0434   10/26/19 1400  nafcillin injection 12 g  Status:  Discontinued        12 g Intravenous Daily 10/26/19 1238 10/26/19 1242   10/26/19 1400  nafcillin 12 g in dextrose 5 % 50 mL IVPB  Status:  Discontinued        12 g 100 mL/hr over 30 Minutes Intravenous Every 24 hours 10/26/19 1242 10/26/19 1339   10/26/19 1400  nafcillin 12 g in dextrose 5 % 500 mL IVPB  Status:  Discontinued        12 g 20.8 mL/hr over 24 Hours Intravenous Every 24 hours 10/26/19 1337 10/26/19 1416  10/26/19 1200  nafcillin 2 g in sodium chloride 0.9 % 100 mL IVPB  Status:  Discontinued        2 g 200 mL/hr over 30  Minutes Intravenous Every 4 hours 10/26/19 1010 10/26/19 1238   10/12/19 2200  cefTRIAXone (ROCEPHIN) 2 g in sodium chloride 0.9 % 100 mL IVPB  Status:  Discontinued        2 g 200 mL/hr over 30 Minutes Intravenous Every 24 hours 10/12/19 0441 10/12/19 0921   10/12/19 0930  ceFAZolin (ANCEF) IVPB 2g/100 mL premix  Status:  Discontinued        2 g 200 mL/hr over 30 Minutes Intravenous Every 8 hours 10/12/19 0921 10/26/19 1010   10/11/19 1000  remdesivir 100 mg in sodium chloride 0.9 % 100 mL IVPB       "Followed by" Linked Group Details   100 mg 200 mL/hr over 30 Minutes Intravenous Daily 10/10/19 0050 10/14/19 0956   10/11/19 0800  vancomycin (VANCOCIN) IVPB 1000 mg/200 mL premix  Status:  Discontinued        1,000 mg 200 mL/hr over 60 Minutes Intravenous Every 8 hours 10/10/19 2239 10/12/19 0921   10/10/19 2230  vancomycin (VANCOCIN) IVPB 1000 mg/200 mL premix        1,000 mg 200 mL/hr over 60 Minutes Intravenous Every 1 hr x 2 10/10/19 2208 10/11/19 0052   10/10/19 2215  cefTRIAXone (ROCEPHIN) 2 g in sodium chloride 0.9 % 100 mL IVPB  Status:  Discontinued        2 g 200 mL/hr over 30 Minutes Intravenous Every 24 hours 10/10/19 2205 10/12/19 0441   10/10/19 0100  remdesivir 100 mg in sodium chloride 0.9 % 100 mL IVPB       "Followed by" Linked Group Details   100 mg 200 mL/hr over 30 Minutes Intravenous Every 30 min 10/10/19 0050 10/10/19 0332        Susa Raring M.D on 11/09/2019 at 9:51 AM  To page go to [email or web address] -  [facility] -  Office  [phone]   See all Orders from today for further details    Objective:   Vitals:   11/08/19 2015 11/08/19 2059 11/09/19 0357 11/09/19 0420  BP: 116/74 116/74 122/75 117/76  Pulse: 96 96 97 95  Resp: 18  18 19   Temp: 98.1 F (36.7 C)  98.6 F (37 C) 98.2 F (36.8 C)  TempSrc: Oral  Oral Oral  SpO2: 95%  94% 91%  Weight:      Height:        Wt Readings from Last 3 Encounters:  11/07/19 115.5 kg  09/11/19  111.1 kg  05/12/17 (!) 145.2 kg     Intake/Output Summary (Last 24 hours) at 11/09/2019 0951 Last data filed at 11/09/2019 0700 Gross per 24 hour  Intake 800 ml  Output 4285 ml  Net -3485 ml     Physical Exam  Awake Alert, No new F.N deficits, chronic bilateral rotator cuff injury induced bilateral upper extremity weakness in the deltoid area, per patient this is old and unchanged,  chronic foot wounds on both feet under bandage, right arm PICC line in place.  [place].AT,PERRAL Supple Neck,No JVD, No cervical lymphadenopathy appriciated.  Symmetrical Chest wall movement, Good air movement bilaterally, CTAB RRR,No Gallops, Rubs or new Murmurs, No Parasternal Heave +ve B.Sounds, Abd Soft, No tenderness, No organomegaly appriciated, No rebound - guarding or rigidity.      Data Review:    CBC Recent Labs  Lab 11/03/19 0356 11/05/19 0356 11/06/19 0339 11/07/19 0350 11/08/19 0450  WBC 8.6 9.6 9.7 8.1 7.7  HGB 9.6* 10.1* 10.2* 9.4* 9.3*  HCT 30.2* 30.9* 32.3* 29.0* 29.2*  PLT 328 353 354 305 297  MCV 93.8 92.8 94.4 92.9 92.1  MCH 29.8 30.3 29.8 30.1 29.3  MCHC 31.8 32.7 31.6 32.4 31.8  RDW 13.1 13.2 13.4 13.6 13.5  LYMPHSABS 2.6 3.0 3.1 2.5 2.4  MONOABS 0.7 0.8 0.7 0.8 0.7  EOSABS 0.4 0.5 0.5 0.4 0.3  BASOSABS 0.1 0.0 0.0 0.0 0.0    Recent Labs  Lab 11/03/19 0356 11/04/19 1347 11/05/19 0356 11/05/19 0357 11/06/19 0339 11/07/19 0350 11/08/19 0450 11/08/19 1144 11/09/19 0430  NA 136  --  135  --  134* 136 134*  --  133*  K 3.6  --  4.0  --  4.0 3.7 3.8  --  3.7  CL 102  --  100  --  98 101 99  --  97*  CO2 24  --  25  --  26 25 25   --  26  GLUCOSE 105*  --  155*  --  163* 141* 136*  --  139*  BUN <5*  --  5*  --  <5* <5* 5*  --  6  CREATININE 0.70  --  0.76  --  0.78 0.75 0.70  --  0.71  CALCIUM 8.8*  --  8.9  --  8.9 8.6* 8.7*  --  8.7*  AST 17  --  25  --  21 17 20   --   --   ALT 17  --  17  --  18 17 18   --   --   ALKPHOS 85  --  84  --  89 80 83  --   --     BILITOT 0.8  --  0.7  --  0.7 0.8 0.9  --   --   ALBUMIN 2.1*  --  2.2*  --  2.3* 2.1* 2.2*  --   --   MG 1.4*   < > 1.6*  --  1.8 1.5* 1.7  --  1.6*  CRP  --   --  9.8*  --  11.4* 9.8* 10.4*  --   --   DDIMER  --   --  5.95*  --   --   --   --   --   --   PROCALCITON  --   --  <0.10  --   --   --   --   --   --   BNP  --   --   --  36.0  --   --   --  36.1 28.8   < > = values in this interval not displayed.    ------------------------------------------------------------------------------------------------------------------ No results for input(s): CHOL, HDL, LDLCALC, TRIG, CHOLHDL, LDLDIRECT in the last 72 hours.  Lab Results  Component Value Date   HGBA1C 11.3 (H) 10/26/2019   ------------------------------------------------------------------------------------------------------------------ No results for input(s): TSH, T4TOTAL, T3FREE, THYROIDAB in the last 72 hours.  Invalid input(s): FREET3  Cardiac Enzymes No results for input(s): CKMB, TROPONINI, MYOGLOBIN in the last 168 hours.  Invalid input(s): CK ------------------------------------------------------------------------------------------------------------------    Component Value Date/Time   BNP 28.8 11/09/2019 0430    Micro Results No results found for this or any previous visit (from the past 240 hour(s)).  Radiology Reports CT HEAD WO CONTRAST  Result Date: 10/19/2019 CLINICAL DATA:  Deliriums.  Mental  status change. EXAM: CT HEAD WITHOUT CONTRAST TECHNIQUE: Contiguous axial images were obtained from the base of the skull through the vertex without intravenous contrast. COMPARISON:  None. FINDINGS: Brain: Area subcortical low-density involving the posterior left frontal lobe at the convexity, series 3, image 25 and series 5, image 49, likely encephalomalacia and sequela of remote infarct, however nonspecific. No hemorrhage. No evidence of acute ischemia. No hydrocephalus, midline shift or mass effect. No subdural or  extra-axial collection. Vascular: Atherosclerosis of skullbase vasculature without hyperdense vessel or abnormal calcification. Skull: No fracture or focal lesion. Sinuses/Orbits: Nasogastric tube in place. Fluid levels in the sphenoid sinuses and right maxillary sinus with scattered opacification of ethmoid air cells may be related to intubation. Opacification of the right greater than left mastoid air cells. Other: None. IMPRESSION: 1. No acute intracranial abnormality. 2. Area of subcortical low-density involving the posterior left frontal lobe at the convexity, likely encephalomalacia and sequela of remote infarct. There are no prior exams for comparison to establish chronicity. MRI could be considered for further evaluation based on clinical concern. 3. Paranasal sinus fluid levels on opacification of the right greater than left mastoid air cells, possibly related to intubation. Electronically Signed   By: Narda Rutherford M.D.   On: 10/19/2019 15:30   CT ANGIO CHEST PE W OR WO CONTRAST  Result Date: 10/23/2019 CLINICAL DATA:  COVID pneumonia, acute hypoxic respiratory failure, positive D-dimer EXAM: CT ANGIOGRAPHY CHEST WITH CONTRAST TECHNIQUE: Multidetector CT imaging of the chest was performed using the standard protocol during bolus administration of intravenous contrast. Multiplanar CT image reconstructions and MIPs were obtained to evaluate the vascular anatomy. CONTRAST:  108mL OMNIPAQUE IOHEXOL 350 MG/ML SOLN COMPARISON:  None. FINDINGS: Cardiovascular: Satisfactory opacification of the pulmonary arteries to the segmental level. No evidence of pulmonary embolism. The central pulmonary arteries are of normal caliber. There is moderate coronary artery calcification. Normal heart size. No pericardial effusion. The thoracic aorta is unremarkable save for bovine arch anatomy. Right upper extremity PICC line tip is seen within the superior vena cava. Mediastinum/Nodes: No enlarged mediastinal, hilar, or  axillary lymph nodes. Thyroid gland, trachea, and esophagus demonstrate no significant findings. Nasoenteric feeding tube extends into the upper abdomen beyond the margin of the examination. Lungs/Pleura: Lung volumes are small. Evaluation is limited by motion artifact, however, multifocal pulmonary infiltrates are identified demonstrating a peripheral and basal predominance, likely infectious or inflammatory in the acute setting. No central obstructing lesion. No pneumothorax or pleural effusion. No superimposed interstitial edema. Upper Abdomen: No acute abnormality. Musculoskeletal: No chest wall abnormality. No acute or significant osseous findings. Review of the MIP images confirms the above findings. IMPRESSION: 1. No evidence of pulmonary embolism. 2. Multifocal pulmonary infiltrates are identified demonstrating a peripheral and basal predominance, likely infectious or inflammatory in the acute setting. 3. Moderate coronary artery calcification. Electronically Signed   By: Helyn Numbers MD   On: 10/23/2019 16:25   MR ANGIO HEAD WO CONTRAST  Result Date: 10/29/2019 CLINICAL DATA:  Neuro deficit with stroke suspected EXAM: MRA HEAD WITHOUT CONTRAST TECHNIQUE: Angiographic images of the Circle of Willis were obtained using MRA technique without intravenous contrast. COMPARISON:  Four days ago FINDINGS: History of bacteremia and embolic infarction. No vessel beading, stenosis, or pseudoaneurysm. IMPRESSION: Negative intracranial MRA. Electronically Signed   By: Marnee Spring M.D.   On: 10/29/2019 12:04   MR BRAIN WO CONTRAST  Result Date: 10/25/2019 CLINICAL DATA:  Neuro deficit, acute, stroke suspected. Additional history provided: COVID positive. Additional  history obtained from electronic MEDICAL RECORD NUMBERSepsis with MSSA and group B strep bacteremia. EXAM: MRI HEAD WITHOUT CONTRAST TECHNIQUE: Multiplanar, multiecho pulse sequences of the brain and surrounding structures were obtained without  intravenous contrast. COMPARISON:  Head CT 10/19/2019. FINDINGS: Brain: The examination is limited by poor signal on several sequences as well as motion degradation. Most notably, there is moderate motion degradation of the axial T2 weighted sequence, severe motion degradation of the axial SWI sequence and moderate motion degradation of the coronal T2 weighted sequence. Cerebral volume is normal for age. There is a 17 mm focus of restricted diffusion and corresponding T2/FLAIR hyperintensity within the subcortical left frontoparietal lobes (for instance as seen on series 2, image 39) (series 3, image 17). Additional mild scattered T2/FLAIR hyperintensity within the cerebral white matter and pons is nonspecific, but consistent with chronic small vessel ischemic disease. Severe motion degradation of the axial SWI sequence precludes adequate evaluation for intracranial blood products. No extra-axial fluid collection. No midline shift. Vascular: Expected proximal arterial flow voids. Skull and upper cervical spine: No focal marrow lesion is identified within described limitations. Sinuses/Orbits: Visualized orbits show no acute finding. Air-fluid levels within the sphenoid and right maxillary sinuses. Partial opacification of right ethmoid air cells. Bilateral mastoid effusions. These results will be called to the ordering clinician or representative by the Radiologist Assistant, and communication documented in the PACS or [employer]. IMPRESSION: Examination limited by poor signal on several sequences as well as motion degradation, as described. 17 mm focus of restricted diffusion and T2/FLAIR hyperintensity within the subcortical left frontoparietal lobes. This finding is nonspecific, but given the patient's history, the primary differential considerations are acute/early subacute infarct versus cerebritis. Consider contrast-enhanced MR imaging of the brain for further evaluation of this lesion. Mild chronic  small vessel ischemic disease. Paranasal sinus disease with air-fluid levels. Bilateral mastoid effusions. Electronically Signed   By: Jackey Loge DO   On: 10/25/2019 15:16   MR BRAIN W CONTRAST  Result Date: 10/25/2019 CLINICAL DATA:  Brain mass follow-up EXAM: MRI HEAD WITH CONTRAST TECHNIQUE: Multiplanar, multiecho pulse sequences of the brain and surrounding structures were obtained with intravenous contrast. CONTRAST:  10mL GADAVIST GADOBUTROL 1 MMOL/ML IV SOLN COMPARISON:  Brain MRI without contrast 10/25/2019 FINDINGS: Brain: There is a peripherally enhancing lesion at the base of the left precentral gyrus that measures 0.9 x 0.7 x 1.8 cm. There are no other areas of abnormal contrast enhancement. 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 Electronically Signed   By: Deatra Robinson M.D.   On: 10/25/2019 22:47   MR FOOT LEFT WO CONTRAST  Result Date: 10/25/2019 CLINICAL DATA:  Bacteremia left foot pain and diabetic EXAM: MRI OF THE LEFT FOOT WITHOUT CONTRAST TECHNIQUE: Multiplanar, multisequence MR imaging of the left was performed. No intravenous contrast was administered. COMPARISON:  October 23, 2019 FINDINGS: Bones/Joint/Cartilage Mildly angulated nondisplaced fractures of the midshaft of the fourth and fifth metatarsals are noted. There is periosteal reaction seen along the medial margins of the metatarsal shafts. Increased heterogeneous T2 hyperintense signal with subtle T1 hypointensity seen at the base of the fourth and fifth metatarsals. No other osseous marrow signal abnormality is seen. There is no large knee joint effusions noted. The articular surfaces appear to be maintained. Ligaments The Lisfranc ligament is intact. Muscles and Tendons Increased feathery signal with atrophy is seen throughout the muscles of the forefoot. There is a small  amount of fluid seen surrounding the posterior  tibialis tendon. The remainder of the flexor and extensor tendons are intact. Soft tissues Lateral plantar surface of the forefoot overlying the fifth metatarsal base there is a focal area of ulceration measuring approximately 8 mm in transverse dimension. A fluid-filled sinus tract is seen extending to the overlying osseous surface. There is a multilocular fluid collection which extends around the dorsal surface of the fifth metatarsal measuring approximately 5.4 x 0.9 x 3.4 cm. There is extensive dorsal and lateral subcutaneous edema and skin thickening. IMPRESSION: Superficial area of ulceration over the plantar lateral aspect of the fifth metatarsal base with a fluid-filled sinus tract and loculated probable abscess extending over the dorsal surface of the fifth metatarsal measuring 5.4 x 0.9 by is a 3.4 cm. Incomplete mildly angulated fourth and fifth metatarsal shaft fractures with periosteal reaction Findings which could be suggestive of reactive marrow versus early osteomyelitis involving the base of the fourth and fifth metatarsals. Electronically Signed   By: Jonna Clark M.D.   On: 10/25/2019 19:06   DG Chest Port 1 View  Result Date: 10/26/2019 CLINICAL DATA:  Short of breath.  COVID positive EXAM: PORTABLE CHEST 1 VIEW COMPARISON:  10/25/2019 FINDINGS: Hypoventilation with decreased lung volume. Mild bibasilar airspace disease unchanged. No new infiltrate or effusion. Right arm PICC tip in the SVC unchanged. IMPRESSION: Hypoventilation with bibasilar mild airspace disease unchanged. Electronically Signed   By: Marlan Palau M.D.   On: 10/26/2019 16:10   DG Chest Port 1 View  Result Date: 10/25/2019 CLINICAL DATA:  Shortness of breath. EXAM: PORTABLE CHEST 1 VIEW COMPARISON:  October 16, 2019. FINDINGS: The heart size and mediastinal contours are within normal limits. Hypoinflation of the lungs is noted with mild bibasilar subsegmental atelectasis. Endotracheal tube has been removed. Right-sided  PICC line is unchanged in position. The visualized skeletal structures are unremarkable. IMPRESSION: Hypoinflation of the lungs with mild bibasilar subsegmental atelectasis. Electronically Signed   By: Lupita Raider M.D.   On: 10/25/2019 08:37   DG CHEST PORT 1 VIEW  Result Date: 10/16/2019 CLINICAL DATA:  Status post intubation. EXAM: PORTABLE CHEST 1 VIEW COMPARISON:  10/14/2019 FINDINGS: ET tube tip is above the carina. There is a right arm PICC line with tip at the cavoatrial junction. Lung volumes are low. Similar appearance of bilateral pulmonary opacities compatible with multifocal pneumonia. IMPRESSION: 1. Stable support apparatus. 2. Persistent bilateral pulmonary opacities compatible with multifocal pneumonia. Electronically Signed   By: Signa Kell M.D.   On: 10/16/2019 06:14   DG Chest Port 1 View  Result Date: 10/15/2019 CLINICAL DATA:  49 year old male with history of COVID infection. EXAM: PORTABLE CHEST 1 VIEW COMPARISON:  Chest x-ray 10/14/2019. FINDINGS: An endotracheal tube is in place with tip 3.6 cm above the carina. A nasogastric tube is seen extending into the stomach, however, the tip of the nasogastric tube extends below the lower margin of the image. There is a right upper extremity PICC with tip terminating in the right atrium. Lung volumes are low. Widespread patchy areas of interstitial prominence an ill-defined airspace disease again noted throughout the lungs bilaterally. Overall, aeration is essentially unchanged. No pleural effusions. No evidence of pulmonary edema. No pneumothorax. Heart size is normal. Upper mediastinal contours are within normal limits. IMPRESSION: 1. Support apparatus, as above. 2. The appearance the chest is compatible with multilobar pneumonia from reported COVID infection. Electronically Signed   By: Trudie Reed M.D.   On: 10/15/2019 05:39  DG Chest Port 1 View  Result Date: 10/14/2019 CLINICAL DATA:  COVID-19, ARDS EXAM: PORTABLE CHEST  1 VIEW COMPARISON:  10/13/2019 FINDINGS: Single frontal view of the chest demonstrates stable position of the endotracheal tube and enteric catheter. There is a right-sided PICC tip overlying superior vena cava. The cardiac silhouette is stable. Interstitial and ground-glass opacities are seen throughout the lungs bilaterally, unchanged. Lung volumes are diminished. No effusion or pneumothorax. IMPRESSION: 1. 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 2. Stable support devices. Electronically Signed   By: Sharlet Salina M.D.   On: 10/14/2019 15:57   DG Chest Port 1 View  Result Date: 10/13/2019 CLINICAL DATA:  Acute respiratory failure with hypoxemia, COVID positive, asthma, diabetes mellitus, hypertension, CHF EXAM: PORTABLE CHEST 1 VIEW COMPARISON:  Portable exam 1021 hours compared to 10/12/2019 FINDINGS: Tip of endotracheal tube projects 3.7 cm above carina. Nasogastric tube extends into stomach. RIGHT arm PICC line tip projects over cavoatrial junction. Stable heart size mediastinal contours for technique. Patchy BILATERAL airspace infiltrates consistent with multifocal pneumonia, minimally improved. No pleural effusion or pneumothorax. IMPRESSION: Minimally improved pulmonary infiltrates. Electronically Signed   By: Ulyses Southward M.D.   On: 10/13/2019 10:37   DG CHEST PORT 1 VIEW  Result Date: 10/12/2019 CLINICAL DATA:  Intubation EXAM: PORTABLE CHEST 1 VIEW COMPARISON:  Earlier today FINDINGS: New endotracheal tube with tip at the clavicular heads, measuring 3 cm above the carina. Low volume chest with severe airspace disease. No visible effusion or air leak. Stable heart size which is distorted by positioning and volumes. IMPRESSION: 1. Unremarkable positioning of the endotracheal tube. 2. Stable extensive airspace disease with low lung volumes. Electronically Signed   By: Marnee Spring M.D.   On: 10/12/2019  07:13   DG CHEST PORT 1 VIEW  Result Date: 10/12/2019 CLINICAL DATA:  Shortness of breath EXAM: PORTABLE CHEST 1 VIEW COMPARISON:  Three days ago FINDINGS: Very low volume chest with diffuse and patchy pulmonary opacity. Vascular pedicle widening and prominent heart size largely related to technique. No visible effusion or pneumothorax. IMPRESSION: Worsening multifocal pneumonia.  Lung volumes remain very low. Electronically Signed   By: Marnee Spring M.D.   On: 10/12/2019 04:07   DG Abd Portable 1V  Result Date: 10/20/2019 CLINICAL DATA:  GI problem. EXAM: PORTABLE ABDOMEN - 1 VIEW COMPARISON:  10/16/2019 FINDINGS: Feeding tube tip is in the distal stomach or proximal duodenum. Mild gaseous distention of the stomach. Gas throughout nondistended large and small bowel. No evidence of bowel obstruction, organomegaly or free air. IMPRESSION: Feeding tube tip in the distal stomach or proximal duodenum. No evidence of obstruction or free air. Electronically Signed   By: Charlett Nose M.D.   On: 10/20/2019 21:28   DG Abd Portable 1V  Result Date: 10/16/2019 CLINICAL DATA:  Status post OG tube placement EXAM: PORTABLE ABDOMEN - 1 VIEW COMPARISON:  10/16/2019 FINDINGS: The enteric tube tip projects over the distal body of stomach in the side port is below the level of the GE junction. A few air-filled loops of small bowel are noted within the upper abdomen which measure up to 2.8 cm. IMPRESSION: Satisfactory position of enteric tube. Electronically Signed   By: Signa Kell M.D.   On: 10/16/2019 06:52   DG Abd Portable 1V  Result Date: 10/12/2019 CLINICAL DATA:  Onychogryphosis EXAM: PORTABLE ABDOMEN - 1 VIEW COMPARISON:  Portable exam 0830 hours without priors for comparison  FINDINGS: Tip of nasogastric tube projects over distal antrum. Nonobstructive bowel gas pattern. No bowel dilatation or bowel wall thickening. Patchy bibasilar infiltrates. No acute osseous findings. IMPRESSION: Tip of nasogastric  tube projects over distal gastric antrum. Nonobstructive bowel gas pattern. Bibasilar pulmonary infiltrates. Electronically Signed   By: Ulyses Southward M.D.   On: 10/12/2019 08:51   DG Foot 2 Views Left  Result Date: 10/23/2019 CLINICAL DATA:  Bacteremia, LEFT foot pain and diabetic. EXAM: LEFT FOOT - 2 VIEW COMPARISON:  10/13/2019 and prior radiographs FINDINGS: Mildly angulated fractures of the 4th and 5th metatarsals are again noted with fracture lines still evident. Healing changes along these fractures are noted. Overlying soft tissue swelling is noted. No new fracture, subluxation or dislocation noted. No definite radiographic evidence of acute osteomyelitis noted. IMPRESSION: Mildly angulated healing fractures of the 4th and 5th metatarsals with soft tissue swelling again noted but without definite radiographic evidence of acute osteomyelitis. Consider MRI for further evaluation as clinically indicated. Electronically Signed   By: Harmon Pier M.D.   On: 10/23/2019 13:40   DG Foot 2 Views Left  Result Date: 10/13/2019 CLINICAL DATA:  49 year old male with diabetic foot. EXAM: LEFT FOOT - 2 VIEW COMPARISON:  Left foot radiograph dated 09/11/2019. FINDINGS: Evaluation is limited due to positioning. Fractures of the midportion of the fourth and fifth metacarpal with interval progression of bridging callus formation. No new fracture identified. There is no dislocation. There is diffuse soft tissue swelling of the foot. No radiopaque foreign object or soft tissue gas. IMPRESSION: 1. Healing fractures of the fourth and fifth metacarpal. 2. Diffuse soft tissue swelling. No radiopaque foreign object or soft tissue gas. Electronically Signed   By: Elgie Collard M.D.   On: 10/13/2019 16:49   EEG adult  Result Date: 10/27/2019 Charlsie Quest, MD     10/28/2019 10:13 AM Patient Name: KHIZAR FIORELLA MRN: [id number] Epilepsy Attending: Charlsie Quest Referring Physician/Provider: Dr. [name] Date:  10/27/2019 Duration: 22.22 minutes Patient history: 49 year old male with altered mental status. EEG evaluate for seizures. Level of alertness: Awake, asleep AEDs during EEG study: Keppra Technical aspects: This EEG study was done with scalp electrodes positioned according to the 10-20 International system of electrode placement. Electrical activity was acquired at a sampling rate of 500Hz  and reviewed with a high frequency filter of 70Hz  and a low frequency filter of 1Hz . EEG data were recorded continuously and digitally stored. Description: The posterior dominant rhythm consists of 8 Hz activity of moderate voltage (25-35 uV) seen predominantly in posterior head regions, symmetric and reactive to eye opening and eye closing. Sleep was characterized by vertex waves, sleep spindles (12 to 14 Hz), maximal frontocentral region. Frequent runs of sharp waves were seen in frontocentral region, maximal vertex lasting about 5 to 6 seconds consistent with brief ictal-interictal rhythmic discharges. Continuous generalized 5 to 6 Hz theta slowing was also noted. Hyperventilation and photic stimulation were not performed.   ABNORMALITY -Continuous slow, generalized -Brief ictal-interictal rhythmic discharges, bilateral frontocentral region, maximal vertex IMPRESSION: This study showed evidence of potential epileptogenicity arising from bilateral frontocentral, maximal vertex region. The sharp waves are at times rhythmic consistent with brief ictal-interictal rhythmic discharges. Additionally, there is evidence of mild diffuse encephalopathy, nonspecific etiology. Priyanka [name]   Overnight EEG with video  Result Date: 10/28/2019 Charlsie Quest, MD     10/29/2019  8:27 AM Patient Name: LORENZA WINKLEMAN MRN: [id number] Epilepsy Attending: Charlsie Quest Referring Physician/Provider: Dr.  [name] Duration: 10/27/2019 1145 to 10/28/2019 1145  Patient history: 48 year old male with altered mental status. EEG evaluate for  seizures.  Level of alertness: Awake, asleep AEDs during EEG study: Keppra  Technical aspects: This EEG study was done with scalp electrodes positioned according to the 10-20 International system of electrode placement. Electrical activity was acquired at a sampling rate of 500Hz  and reviewed with a high frequency filter of 70Hz  and a low frequency filter of 1Hz . EEG data were recorded continuously and digitally stored.  Description: The posterior dominant rhythm consists of 8 Hz activity of moderate voltage (25-35 uV) seen predominantly in posterior head regions, symmetric and reactive to eye opening and eye closing. Sleep was characterized by vertex waves, sleep spindles (12 to 14 Hz), maximal frontocentral region. Initially, frequent runs of sharp waves were seen in frontocentral region, maximal vertex lasting about 5 to 6 seconds consistent with brief ictal-interictal rhythmic discharges. Continuous generalized 5 to 6 Hz theta slowing as well as intermittent generalized rhythmic 2-3hz  delta slowing  was also noted. Hyperventilation and photic stimulation were not performed.    ABNORMALITY -Continuous slow, generalized - Intermittent rhythmic delta slowing, generalized -Brief ictal-interictal rhythmic discharges, bilateral frontocentral region, maximal vertex  IMPRESSION: This study initially showed evidence of potential epileptogenicity arising from bilateral frontocentral, maximal vertex region. The sharp waves are at times rhythmic consistent with brief ictal-interictal rhythmic discharges. Additionally, there is evidence of mild diffuse encephalopathy, nonspecific etiology. No seizures were seen during thi study. EEG appears to be improving compared to previous day.  Charlsie Quest   ECHOCARDIOGRAM COMPLETE  Result Date: 10/12/2019    ECHOCARDIOGRAM REPORT   Patient Name:   EVRETT HAKIM Date of Exam: 10/12/2019 Medical Rec #:  [id number]      Height:       74.0 in Accession #:    [id number]      Weight:       245.0 lb Date of Birth:  03-08-1970      BSA:          2.369 m Patient Age:    49 years       BP:           111/74 mmHg Patient Gender: M              HR:           75 bpm. Exam Location:  Inpatient Procedure: 2D Echo Indications:    CHF 428                  & Bacteremia 790.7  History:        Patient has no prior history of Echocardiogram examinations.                 CHF; Risk Factors:Hypertension and Diabetes. Covid +.  Sonographer:    Celene Skeen RDCS (AE) Referring Phys: [id number] Clarene Critchley [facility]  Sonographer Comments: Echo performed with patient supine and on artificial respirator. Image acquisition challenging due to respiratory motion and Image acquisition challenging due to patient body habitus. restricted mobility IMPRESSIONS  1. Extremely poor acoustic windows. LVEF appears to be depressed with diffuse hypokinesis, worse in the mid/distal inferior/inferoseptal walls. . Left ventricular ejection fraction, by estimation, is 30 to 35%. The left ventricle has moderately decreased function. The left ventricular internal cavity size was mildly dilated. Left ventricular diastolic parameters are indeterminate.  2. Right ventricular systolic function is moderately reduced. The right ventricular size is mildly enlarged.  3. The mitral valve is normal in structure. Trivial mitral valve regurgitation.  4. The aortic valve is normal in structure. Aortic valve regurgitation is not visualized. FINDINGS  Left Ventricle: Extremely poor acoustic windows. LVEF appears to be depressed with diffuse hypokinesis, worse in the mid/distal inferior/inferoseptal walls. Left ventricular ejection fraction, by estimation, is 30 to 35%. The left ventricle has moderately decreased function. The left ventricle demonstrates regional wall motion abnormalities. The left ventricular internal cavity size was mildly dilated. There is no left ventricular hypertrophy. Left ventricular diastolic parameters are indeterminate. Right  Ventricle: The right ventricular size is mildly enlarged. Right vetricular wall thickness was not assessed. Right ventricular systolic function is moderately reduced. Left Atrium: Left atrial size was normal in size. Right Atrium: Right atrial size was normal in size. Pericardium: There is no evidence of pericardial effusion. Mitral Valve: The mitral valve is normal in structure. Trivial mitral valve regurgitation. Tricuspid Valve: The tricuspid valve is normal in structure. Tricuspid valve regurgitation is trivial. Aortic Valve: The aortic valve is normal in structure. Aortic valve regurgitation is not visualized. Pulmonic Valve: The pulmonic valve was grossly normal. Pulmonic valve regurgitation is trivial. Aorta: The aortic root is normal in size and structure. IAS/Shunts: The interatrial septum was not assessed.  LEFT VENTRICLE PLAX 2D LVIDd:         5.60 cm  Diastology LVIDs:         3.90 cm  LV e' lateral:   8.81 cm/s LV PW:         1.40 cm  LV E/e' lateral: 5.7 LV IVS:        1.10 cm  LV e' medial:    5.87 cm/s LVOT diam:     2.40 cm  LV E/e' medial:  8.5 LV SV:         64 LV SV Index:   27 LVOT Area:     4.52 cm  RIGHT VENTRICLE RV S prime:     9.79 cm/s TAPSE (M-mode): 1.3 cm LEFT ATRIUM             Index       RIGHT ATRIUM           Index LA diam:        3.10 cm 1.31 cm/m  RA Area:     18.80 cm LA Vol (A2C):   62.9 ml 26.55 ml/m RA Volume:   51.80 ml  21.86 ml/m LA Vol (A4C):   40.8 ml 17.22 ml/m LA Biplane Vol: 50.8 ml 21.44 ml/m  AORTIC VALVE LVOT Vmax:   69.80 cm/s LVOT Vmean:  49.400 cm/s LVOT VTI:    0.142 m  AORTA Ao Root diam: 3.60 cm MITRAL VALVE MV Area (PHT): 2.34 cm    SHUNTS MV Decel Time: 324 msec    Systemic VTI:  0.14 m MV E velocity: 50.10 cm/s  Systemic Diam: 2.40 cm MV A velocity: 71.60 cm/s MV E/A ratio:  0.70 Dietrich Pates MD Electronically signed by Dietrich Pates MD Signature Date/Time: 10/12/2019/1:58:41 PM    Final    ECHO TEE  Result Date: 10/24/2019    TRANSESOPHOGEAL ECHO  REPORT   Patient Name:   [name] Date of Exam: 10/24/2019 Medical Rec #:  [id number]      Height:       74.0 in Accession #:    [id number]     Weight:       247.4 lb Date of Birth:  02/07/1971  BSA:          2.379 m Patient Age:    49 years       BP:           120/80 mmHg Patient Gender: M              HR:           100 bpm. Exam Location:  Inpatient Procedure: Transesophageal Echo Indications:    Emobli/Bacteremia  History:        Patient has prior history of Echocardiogram examinations.  Sonographer:    Ross Ludwig RDCS (AE) Referring Phys: [phone] HAO MENG PROCEDURE: The transesophogeal probe was passed without difficulty through the esophogus of the patient. Sedation performed by different physician. The patient's vital signs; including heart rate, blood pressure, and oxygen saturation; remained stable throughout the procedure. The patient developed no complications during the procedure. IMPRESSIONS  1. Left ventricular ejection fraction, by estimation, is 50 to 55%. The left ventricle has low normal function. The left ventricle has no regional wall motion abnormalities.  2. Right ventricular systolic function is normal. The right ventricular size is normal.  3. No left atrial/left atrial appendage thrombus was detected.  4. The mitral valve is normal in structure. Trivial mitral valve regurgitation. No evidence of mitral stenosis.  5. The aortic valve is normal in structure. Aortic valve regurgitation is not visualized. No aortic stenosis is present.  6. The inferior vena cava is normal in size with greater than 50% respiratory variability, suggesting right atrial pressure of 3 mmHg. Conclusion(s)/Recommendation(s): Normal biventricular function without evidence of hemodynamically significant valvular heart disease. FINDINGS  Left Ventricle: Left ventricular ejection fraction, by estimation, is 50 to 55%. The left ventricle has low normal function. The left ventricle has no regional wall motion  abnormalities. The left ventricular internal cavity size was normal in size. There is no left ventricular hypertrophy. Right Ventricle: The right ventricular size is normal. No increase in right ventricular wall thickness. Right ventricular systolic function is normal. Left Atrium: Left atrial size was normal in size. No left atrial/left atrial appendage thrombus was detected. Right Atrium: Right atrial size was normal in size. Pericardium: There is no evidence of pericardial effusion. Mitral Valve: The mitral valve is normal in structure. Normal mobility of the mitral valve leaflets. Trivial mitral valve regurgitation. No evidence of mitral valve stenosis. There is no evidence of mitral valve vegetation. Tricuspid Valve: The tricuspid valve is normal in structure. Tricuspid valve regurgitation is mild . No evidence of tricuspid stenosis. There is no evidence of tricuspid valve vegetation. Aortic Valve: The aortic valve is normal in structure. Aortic valve regurgitation is not visualized. No aortic stenosis is present. There is no evidence of aortic valve vegetation. Pulmonic Valve: The pulmonic valve was normal in structure. Pulmonic valve regurgitation is trivial. No evidence of pulmonic stenosis. Aorta: The aortic root is normal in size and structure. Venous: The inferior vena cava is normal in size with greater than 50% respiratory variability, suggesting right atrial pressure of 3 mmHg. IAS/Shunts: No atrial level shunt detected by color flow Doppler. [name] MD Electronically signed by [name] MD Signature Date/Time: 10/24/2019/3:28:45 PM    Final    VAS US CAROTID (at [facility] and [facility] only)  Result Date: 10/26/2019 Carotid Arterial Duplex Study Indications:       17mm focus of diffusion restriction in the left  frontoparietal lobe concerning for acute/early subacute                    infarct vs cerebritis. Risk Factors:      Hypertension, Diabetes. Other Factors:     Covid-19,  bacteremia. Comparison Study:  No prior study on file Performing Technologist: Sherren Kerns RVS  Examination Guidelines: A complete evaluation includes B-mode imaging, spectral Doppler, color Doppler, and power Doppler as needed of all accessible portions of each vessel. Bilateral testing is considered an integral part of a complete examination. Limited examinations for reoccurring indications may be performed as noted.  Right Carotid Findings: +----------+--------+--------+--------+------------------+--------+             PSV cm/s EDV cm/s Stenosis Plaque Description Comments  +----------+--------+--------+--------+------------------+--------+  CCA Prox   66       11                                             +----------+--------+--------+--------+------------------+--------+  CCA Distal 71       18                                             +----------+--------+--------+--------+------------------+--------+  ICA Prox   58       19                                             +----------+--------+--------+--------+------------------+--------+  ICA Distal 90       33                                             +----------+--------+--------+--------+------------------+--------+  ECA        114      16                                             +----------+--------+--------+--------+------------------+--------+ +----------+--------+-------+--------+-------------------+             PSV cm/s EDV cms Describe Arm Pressure (mmHG)  +----------+--------+-------+--------+-------------------+  Subclavian 157                                            +----------+--------+-------+--------+-------------------+ +---------+--------+--+--------+--+  Vertebral PSV cm/s 38 EDV cm/s 11  +---------+--------+--+--------+--+  Left Carotid Findings: +----------+--------+--------+--------+------------------+--------+             PSV cm/s EDV cm/s Stenosis Plaque Description Comments   +----------+--------+--------+--------+------------------+--------+  CCA Prox   55       14                                             +----------+--------+--------+--------+------------------+--------+  CCA Distal 65       20                                             +----------+--------+--------+--------+------------------+--------+  ICA Prox   68       20                                             +----------+--------+--------+--------+------------------+--------+  ICA Distal 76       23                                             +----------+--------+--------+--------+------------------+--------+  ECA        77       3                                              +----------+--------+--------+--------+------------------+--------+ +----------+--------+--------+--------+-------------------+             PSV cm/s EDV cm/s Describe Arm Pressure (mmHG)  +----------+--------+--------+--------+-------------------+  Subclavian 88                                              +----------+--------+--------+--------+-------------------+ +---------+--------+--+--------+--+  Vertebral PSV cm/s 63 EDV cm/s 18  +---------+--------+--+--------+--+   Summary: Right Carotid: The extracranial vessels were near-normal with only minimal wall                thickening or plaque. Left Carotid: The extracranial vessels were near-normal with only minimal wall               thickening or plaque. Vertebrals:  Bilateral vertebral arteries demonstrate antegrade flow. Subclavians: Normal flow hemodynamics were seen in bilateral subclavian              arteries. *See table(s) above for measurements and observations.  Electronically signed by Delia Heady MD on 10/26/2019 at 12:37:48 PM.    Final    VAS [place] LOWER EXTREMITY ARTERIAL DUPLEX  Result Date: 10/26/2019 LOWER EXTREMITY ARTERIAL DUPLEX STUDY Indications: Ulceration, and Osteomyelitis. High Risk Factors: Hypertension, Diabetes. Other Factors: Covid-19. Charcot foot.  Current ABI: N/A  Comparison Study: No prior study Performing Technologist: Sherren Kerns RVS  Examination Guidelines: A complete evaluation includes B-mode imaging, spectral Doppler, color Doppler, and power Doppler as needed of all accessible portions of each vessel. Bilateral testing is considered an integral part of a complete examination. Limited examinations for reoccurring indications may be performed as noted.  +----------+--------+-----+--------+---------+--------+  LEFT       PSV cm/s Ratio Stenosis Waveform  Comments  +----------+--------+-----+--------+---------+--------+  CFA Prox   123                     triphasic           +----------+--------+-----+--------+---------+--------+  DFA        51                      triphasic           +----------+--------+-----+--------+---------+--------+  SFA Prox   97                      triphasic           +----------+--------+-----+--------+---------+--------+  SFA Mid    88                      triphasic           +----------+--------+-----+--------+---------+--------+  SFA Distal 81                      triphasic           +----------+--------+-----+--------+---------+--------+  POP Prox   60                      triphasic           +----------+--------+-----+--------+---------+--------+  POP Distal 86                      triphasic           +----------+--------+-----+--------+---------+--------+  ATA Distal 110                     triphasic           +----------+--------+-----+--------+---------+--------+  PTA Prox   34                      triphasic           +----------+--------+-----+--------+---------+--------+  PTA Mid    29                      triphasic           +----------+--------+-----+--------+---------+--------+  PTA Distal 125                     triphasic           +----------+--------+-----+--------+---------+--------+  Summary: Left: Normal waveforms and adequate flow noted.  See table(s) above for measurements and observations. Electronically signed by  Lemar Livings MD on 10/26/2019 at 4:50:14 PM.    Final    VAS [place] LOWER EXTREMITY ARTERIAL DUPLEX  Result Date: 10/19/2019 LOWER EXTREMITY ARTERIAL DUPLEX STUDY Indications: Cold leg, Covid-19.  Current ABI: N/A Comparison Study: No prior study Performing Technologist: Sherren Kerns RVS  Examination Guidelines: A complete evaluation includes B-mode imaging, spectral Doppler, color Doppler, and power Doppler as needed of all accessible portions of each vessel. Bilateral testing is considered an integral part of a complete examination. Limited examinations for reoccurring indications may be performed as noted.  +-----------+--------+-----+--------+---------+--------+  RIGHT       PSV cm/s Ratio Stenosis Waveform  Comments  +-----------+--------+-----+--------+---------+--------+  CFA Prox    67                      triphasic           +-----------+--------+-----+--------+---------+--------+  DFA         60                      triphasic           +-----------+--------+-----+--------+---------+--------+  SFA Prox    68                      triphasic           +-----------+--------+-----+--------+---------+--------+  SFA Mid     62                      triphasic           +-----------+--------+-----+--------+---------+--------+  SFA Distal  69                      triphasic           +-----------+--------+-----+--------+---------+--------+  POP Prox    47                      triphasic           +-----------+--------+-----+--------+---------+--------+  POP Distal  42                      triphasic           +-----------+--------+-----+--------+---------+--------+  ATA Distal  15                      triphasic           +-----------+--------+-----+--------+---------+--------+  PTA Prox    31                      triphasic           +-----------+--------+-----+--------+---------+--------+  PTA Mid     38                      triphasic           +-----------+--------+-----+--------+---------+--------+  PTA Distal  38                       triphasic           +-----------+--------+-----+--------+---------+--------+  PERO Distal 36                      triphasic           +-----------+--------+-----+--------+---------+--------+  Summary: Right: Normal examination. No evidence of arterial occlusive disease. Normal waveforms, no stenosis.  See table(s) above for measurements and observations. Electronically signed by Sherald Hess MD on 10/19/2019 at 3:25:02 PM.    Final    VAS [place] LOWER EXTREMITY VENOUS (DVT)  Result Date: 10/26/2019  Lower Venous DVTStudy Indications: Covid-19, elevated D-Dimer.  Comparison Study: Prior negative left lower extremity venous duplex don 03/19/17                   and is available for comparison. Performing Technologist: Sherren Kerns RVS  Examination Guidelines: A complete evaluation includes B-mode imaging, spectral Doppler, color Doppler, and power Doppler as needed of all accessible portions of each vessel. Bilateral testing is considered an integral part of a complete examination. Limited examinations for reoccurring indications may be performed as noted. The reflux portion of the exam is performed with the patient in reverse Trendelenburg.  +---------+---------------+---------+-----------+----------+--------------+  RIGHT     Compressibility Phasicity Spontaneity Properties Thrombus Aging  +---------+---------------+---------+-----------+----------+--------------+  CFV       Full            Yes       Yes                                    +---------+---------------+---------+-----------+----------+--------------+  SFJ       Full                                                             +---------+---------------+---------+-----------+----------+--------------+  FV Prox   Full                                                             +---------+---------------+---------+-----------+----------+--------------+  FV Mid    Full                                                              +---------+---------------+---------+-----------+----------+--------------+  FV Distal Full                                                             +---------+---------------+---------+-----------+----------+--------------+  PFV       Full                                                             +---------+---------------+---------+-----------+----------+--------------+  POP       Partial         Yes       Yes                    Acute           +---------+---------------+---------+-----------+----------+--------------+  PTV       Full                                                             +---------+---------------+---------+-----------+----------+--------------+  PERO      Full                                                             +---------+---------------+---------+-----------+----------+--------------+   +---------+---------------+---------+-----------+----------+--------------+  LEFT      Compressibility Phasicity Spontaneity Properties Thrombus Aging  +---------+---------------+---------+-----------+----------+--------------+  CFV       Full            Yes       Yes                                    +---------+---------------+---------+-----------+----------+--------------+  SFJ       Full                                                             +---------+---------------+---------+-----------+----------+--------------+  FV Prox   Full                                                             +---------+---------------+---------+-----------+----------+--------------+  FV Mid    Full                                                             +---------+---------------+---------+-----------+----------+--------------+  FV Distal Full                                                             +---------+---------------+---------+-----------+----------+--------------+  PFV       Full                                                              +---------+---------------+---------+-----------+----------+--------------+  POP       Full            Yes       Yes                                    +---------+---------------+---------+-----------+----------+--------------+  PTV       Full                                                             +---------+---------------+---------+-----------+----------+--------------+  PERO      Full                                                             +---------+---------------+---------+-----------+----------+--------------+     Summary: RIGHT: - Findings consistent with acute deep vein thrombosis involving the right popliteal vein. - Ultrasound characteristics of enlarged lymph nodes are noted in the groin.  LEFT: - There is no evidence of deep vein thrombosis in the lower extremity.  *See table(s) above for measurements and observations. Electronically signed by Lemar Livings MD on 10/26/2019 at 4:50:27 PM.    Final    [place] EKG SITE RITE  Result Date: 10/12/2019 If Site Rite image not attached, placement could not be confirmed due to current cardiac rhythm.

## 2019-11-10 DIAGNOSIS — R7881 Bacteremia: Secondary | ICD-10-CM

## 2019-11-10 DIAGNOSIS — B9561 Methicillin susceptible Staphylococcus aureus infection as the cause of diseases classified elsewhere: Secondary | ICD-10-CM

## 2019-11-10 LAB — BASIC METABOLIC PANEL
Anion gap: 10 (ref 5–15)
BUN: 5 mg/dL — ABNORMAL LOW (ref 6–20)
CO2: 27 mmol/L (ref 22–32)
Calcium: 8.7 mg/dL — ABNORMAL LOW (ref 8.9–10.3)
Chloride: 98 mmol/L (ref 98–111)
Creatinine, Ser: 0.72 mg/dL (ref 0.61–1.24)
GFR calc Af Amer: >60 mL/min (ref >60)
GFR calc non Af Amer: >60 mL/min (ref >60)
Glucose, Bld: 147 mg/dL — ABNORMAL HIGH (ref 70–99)
Potassium: 3.7 mmol/L (ref 3.5–5.1)
Sodium: 135 mmol/L (ref 135–145)

## 2019-11-10 LAB — GLUCOSE, CAPILLARY
Glucose-Capillary: 130 mg/dL — ABNORMAL HIGH (ref 70–99)
Glucose-Capillary: 133 mg/dL — ABNORMAL HIGH (ref 70–99)
Glucose-Capillary: 135 mg/dL — ABNORMAL HIGH (ref 70–99)
Glucose-Capillary: 145 mg/dL — ABNORMAL HIGH (ref 70–99)
Glucose-Capillary: 179 mg/dL — ABNORMAL HIGH (ref 70–99)
Glucose-Capillary: 220 mg/dL — ABNORMAL HIGH (ref 70–99)

## 2019-11-10 LAB — MAGNESIUM: Magnesium: 2 mg/dL (ref 1.7–2.4)

## 2019-11-10 MED ORDER — INSULIN ASPART 100 UNIT/ML ~~LOC~~ SOLN
0.0000 [IU] | Freq: Three times a day (TID) | SUBCUTANEOUS | Status: DC
  Administered 2019-11-11: 3 [IU] via SUBCUTANEOUS
  Administered 2019-11-11: 5 [IU] via SUBCUTANEOUS
  Administered 2019-11-11 – 2019-11-13 (×5): 3 [IU] via SUBCUTANEOUS
  Administered 2019-11-13 – 2019-11-14 (×4): 2 [IU] via SUBCUTANEOUS
  Administered 2019-11-14 – 2019-11-15 (×4): 3 [IU] via SUBCUTANEOUS

## 2019-11-10 MED ORDER — POTASSIUM CHLORIDE CRYS ER 20 MEQ PO TBCR
40.0000 meq | EXTENDED_RELEASE_TABLET | Freq: Once | ORAL | Status: AC
  Administered 2019-11-10: 40 meq via ORAL
  Filled 2019-11-10: qty 2

## 2019-11-10 MED ORDER — FUROSEMIDE 10 MG/ML IJ SOLN
60.0000 mg | Freq: Once | INTRAMUSCULAR | Status: AC
  Administered 2019-11-10: 60 mg via INTRAVENOUS
  Filled 2019-11-10: qty 6

## 2019-11-10 NOTE — Progress Notes (Signed)
Physical Therapy Treatment Patient Details Name: Micheal Bell MRN: [id number] DOB: April 07, 1970 Today's Date: 11/10/2019    History of Present Illness Pt is a 49 y.o. male admitted 10/09/19 for COVID PNA. Pt decompensated 8/19 and intubated; self extubated 8/23 and reintubated; extubated 8/28. Course complicated by MSSA and GBS bacteria, sepsis, systolic HF, acute metabolic encephalopathy. 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 TEE without evidence of vegitation. 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    PT Comments    Patient received in bed, very motivated and pleasant but continues to demonstrate significant impairment with safety and perception of deficits. See below for mobility/assist levels- overall improving significantly as compared to session yesterday! Able to take side steps and perform standing marches alongside bed with min guard/RW with only mild R knee buckling, also tolerated significant progression of gait training to in-room distances today! Did have two episodes each of mild R knee buckling and moderate R knee hyperextension, however able to recover from both with BUE support on RW and only min guard/VC from PT. Limited gait distance for safety today, but feel he could go much further with chair follow. He refused up to chair due to back pain. Left in bed with all needs met, bed alarm active and NT present/attending. Definitely a great candidate for inpatient rehab services!    Follow Up Recommendations  CIR     Equipment Recommendations  None recommended by PT    Recommendations for Other Services       Precautions / Restrictions Precautions Precautions: Fall Precaution Comments: Charcot foot LLE with  wound Required Braces or Orthoses: Other Brace Other Brace: L cam boot and R knee sleeve with stays Restrictions Weight Bearing Restrictions: No    Mobility  Bed Mobility Overal bed mobility: Needs Assistance Bed Mobility: Supine to Sit;Sit to Supine   Sidelying to sit: Mod assist Supine to sit: Min guard     General bed mobility comments: ModA to bring trunk up, then able to pivot hips around with min guard; only needed min guard and extended time for return to bed  Transfers Overall transfer level: Needs assistance Equipment used: Rolling walker (2 wheeled) Transfers: Sit to/from Stand Sit to Stand: Min guard;Supervision         General transfer comment: initially light Min guard to power up and gain balance, faded to close supervision with cues for hand placement and safety  Ambulation/Gait Ambulation/Gait assistance: Min guard Gait Distance (Feet): 30 Feet (73ft, 59ft) Assistive device: Rolling walker (2 wheeled) Gait Pattern/deviations: Step-through pattern;Decreased step length - right;Decreased step length - left;Decreased stride length;Decreased dorsiflexion - right;Decreased weight shift to right;Trunk flexed;Trendelenburg;Narrow base of support Gait velocity: reduced   General Gait Details: slow with RW, did have 2 incidents each of R knee mildly buckling and moderately hyper-extending but able to recover with only min gaurd from PT/UE support on RW. Limited gait distance due to safety concerns today- would really benefit from chair follow to safely progress gait distance   Stairs             Wheelchair Mobility    Modified Rankin (Stroke Patients Only) Modified Rankin (Stroke Patients Only) Pre-Morbid Rankin Score: No symptoms Modified Rankin: Moderate disability     Balance Overall balance assessment: Needs assistance  Sitting-balance support: Feet supported Sitting balance-Leahy Scale: Fair Sitting balance - Comments: Sat EOB with UE support and  S assist.   Standing balance support: Bilateral upper extremity supported;During functional activity Standing balance-Leahy Scale: Poor Standing balance comment: reliant on BUE support                            Cognition Arousal/Alertness: Awake/alert Behavior During Therapy: WFL for tasks assessed/performed Overall Cognitive Status: Impaired/Different from baseline Area of Impairment: Problem solving;Awareness;Safety/judgement                   Current Attention Level: Selective Memory: Decreased short-term memory;Decreased recall of precautions Following Commands: Follows one step commands consistently;Follows one step commands with increased time Safety/Judgement: Decreased awareness of safety;Decreased awareness of deficits Awareness: Intellectual Problem Solving: Slow processing;Requires verbal cues;Requires tactile cues General Comments: initially didn't want to wear CAM boot but able to be persuaded to do so; when walking, often asked if we could try walking without device or with IV pole only- still pretty impaired with perception of deficits and safety      Exercises      General Comments General comments (skin integrity, edema, etc.): patient seems to be a little self-limiting when it comes to standing- able to stand with supervision today. Interestingly, more self limiting with sit to stand transfers and more impaired with safety when it comes to gait training      Pertinent Vitals/Pain Pain Assessment: Faces Faces Pain Scale: Hurts a little bit Pain Location: back Pain Descriptors / Indicators: Discomfort Pain Intervention(s): Limited activity within patient's tolerance;Monitored during session;Repositioned    Home Living                      Prior Function            PT Goals (current goals can now be found in the care plan section) Acute Rehab PT Goals Patient Stated Goal: get home with his family PT Goal Formulation: With  patient Time For Goal Achievement: 11/18/19 Potential to Achieve Goals: Fair Progress towards PT goals: Progressing toward goals    Frequency    Min 4X/week      PT Plan Current plan remains appropriate    Co-evaluation              AM-PAC PT "6 Clicks" Mobility   Outcome Measure  Help needed turning from your back to your side while in a flat bed without using bedrails?: None Help needed moving from lying on your back to sitting on the side of a flat bed without using bedrails?: A Little Help needed moving to and from a bed to a chair (including a wheelchair)?: A Little Help needed standing up from a chair using your arms (e.g., wheelchair or bedside chair)?: A Little Help needed to walk in hospital room?: A Little Help needed climbing 3-5 steps with a railing? : A Lot 6 Click Score: 18    End of Session Equipment Utilized During Treatment: Gait belt Activity Tolerance: Patient tolerated treatment well Patient left: with call bell/phone within reach;in bed;with bed alarm set (NT present and attending) Nurse Communication: Mobility status PT Visit Diagnosis: Other abnormalities of gait and mobility (R26.89);Muscle weakness (generalized) (M62.81);Other symptoms and signs involving the nervous system (O75.643)     Time: 3295-1884 PT Time Calculation (min) (ACUTE ONLY): 33 min  Charges:  $Gait Training: 8-22 mins $Therapeutic Activity: 8-22 mins  Windell Norfolk, DPT, PN1   Supplemental Physical Therapist [facility]    Pager [phone] Acute Rehab Office [phone]

## 2019-11-10 NOTE — Progress Notes (Signed)
PROGRESS NOTE                                                                                                                                                                                                             Patient Demographics:    Micheal Bell, is a 49 y.o. male, DOB - January 28, 1971, ZOX:[id number]  Outpatient Primary MD for the patient is [name], MD    LOS - 29  Admit date - 10/09/2019    Chief Complaint  Patient presents with   Cough       Brief Narrative - 49 yo male with hx of HTN, T2DM, gout, IBS, fibromyalgia, obesity who presented on 8/16 for COVID pneumonia.  He decompensated on 8/19 and was intubated.  Self extubated on 8/23 and was reintubated.  He's subsequently been extubated on 8/28.  He's been on typical therapies for covid including steroids, baricitinib and remdesivir.  His hospitalization was complicated by MSSA and GBS bacteria.  Also found to have systolic heart failure.    Subjective:   Patient in bed, appears comfortable, denies any headache, no fever, no chest pain or pressure, no shortness of breath , no abdominal pain. No focal weakness.   Assessment  & Plan :   Acute Hypoxic Resp. Failure due to Acute Covid 19 Viral Pneumonitis during the ongoing 2020 Covid 19 Pandemic - he had severe hypoxia and parenchymal lung injury he was intubated and admitted by ICU, he was started on IV steroids, remdesivir and Baricitinib on 10/12/2019.  He was subsequently extubated on 10/21/2019.  His stay was complicated by bacteremia after which Baricitinib was discontinued on 10/24/2019.  His pulmonary status is gradually improving, currently he is stable and symptom-free, he is on room air, I have discussed with him about incentive spirometry, flutter valve, pulmonary toiletry, he is now off of isolation and his pulmonary issues from COVID-19 is clinically resolved.   Recent Labs  Lab 11/05/19 0356  11/05/19 0357 11/06/19 0339 11/07/19 0350 11/08/19 0450 11/08/19 1144 11/09/19 0430  WBC 9.6  --  9.7 8.1 7.7  --   --   PLT 353  --  354 305 297  --   --   CRP 9.8*  --  11.4* 9.8* 10.4*  --   --   BNP  --  36.0  --   --   --  36.1 28.8  DDIMER 5.95*  --   --   --   --   --   --   PROCALCITON <0.10  --   --   --   --   --   --   AST 25  --  21 17 20   --   --   ALT 17  --  18 17 18   --   --   ALKPHOS 84  --  89 80 83  --   --   BILITOT 0.7  --  0.7 0.8 0.9  --   --   ALBUMIN 2.2*  --  2.3* 2.1* 2.2*  --   --     Sepsis with MSSA and Group B strep bacteremia due to left foot abscess.  ID following, was on IV Ancef but switch to nafcillin on 10/26/2019.  Clean TEE, history of Charcot joints, peripheral neuropathy and recent left foot soft tissue injury, MRI left foot shows an abscess, Ortho was consulted and they recommended medical management with outpatient follow-up with Dr. [name] post discharge, also has osteomyelitis, defer duration of treatment with antibiotics to ID.  Antibiotics management per ID, plan to continue total of 4 weeks of IV nafcillin, through 9/29, then start Keflex 500 mg 4 times daily, he has a follow-up arranged with Dr. [name] on October 13 at 10:30 AM .   Mild dry cough on 11/08/2019.  X-ray with mild fluid overload, likely acute on chronic diastolic CHF, trial of Lasix and monitor.   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  For seizures he has been started on Keppra with good effect and his mentation has improved considerably. Continue PT OT and speech, SNF VS CIR .  Neuro has seen the patient this admission.   Possible systolic  heart failure as shown by TTE however this could have been transient due to sepsis, on repeat TEE his EF is now normalized to 50 to 55%.  He is currently compensated, blood pressure was too low and after hydration has improved, continue beta-blocker.   Cyanotic right lower extremity toes.  Likely due to hypoperfusion which is transient, arterial ultrasound stable. Resolved.   History of chronic pain.  On multiple narcotics, Flexeril and benzodiazepines.  Was off of these medications for several days without any discomfort, reintroduced on his request at very low doses.  Do not escalate.  HTN - BP was low on multiple blood pressure medications, was hydrated now able to tolerate low-dose beta-blocker.  Acute DVT in the right lower extremity.  Switched to full dose Lovenox >> to Eliquis on 11/11/2019.   Hypomagnesemia.  Persistently low despite aggressive treatment, discussed with nephrologist Dr. Melanee Spry, will obtain 24-hour urinary magnesium and continue to replace as needed.  Magnesium is improved after discontinuation of PPI.  DM type II.  Currently on Lantus and sliding scale monitor.  Lab Results  Component Value Date   HGBA1C 11.3 (H) 10/26/2019   CBG (last 3)  Recent Labs    11/10/19 0004 11/10/19 0420 11/10/19 0737  GLUCAP 133* 135* 130*      Condition -   Guarded  Family Communication  :  Sister and mother [phone] - 10/25/19, 10/27/19, 10/28/19, 11/03/19  Code Status :  Full  Consults  :  PCCM, ID, Cards  Procedures  :    EEG - This study showed evidence of potential epileptogenicity arising from bilateral frontocentral, maximal vertex region. The sharp waves are at times rhythmic consistent with brief ictal-interictal rhythmic discharges. Additionally, there is evidence of mild diffuse encephalopathy, nonspecific etiology.  Bilateral lower extremity venous ultrasound - Acute right lower extremity popliteal vein DVT.  Carotid duplex.  Nonacute.    MRI - Peripherally  enhancing lesion at the base of the left precentral gyrus, favored to indicate cerebritis in this bacteremic patient.  CT Head -  1. No acute intracranial abnormality. 2. Area of subcortical low-density involving the posterior left frontal lobe at the convexity, likely encephalomalacia and sequela of remote infarct. There are no prior exams for comparison to establish chronicity. MRI could be considered for further evaluation based on clinical concern. 3. Paranasal sinus fluid levels on opacification of the right greater than left mastoid air cells, possibly related to intubation.  R. Leg Arterial US -  No evidence of arterial occlusive disease. Normal waveforms, no stenosis  TTE -  1. Extremely poor acoustic windows. LVEF appears to be depressed with diffuse hypokinesis, worse in the mid/distal inferior/inferoseptal walls. . Left ventricular ejection fraction, by estimation, is 30 to 35%. The left ventricle has moderately decreased function. The left ventricular internal cavity size was mildly dilated. Left ventricular diastolic parameters are indeterminate.  2. Right ventricular systolic function is moderately reduced. The right ventricular size is mildly enlarged.  3. The mitral valve is normal in structure. Trivial mitral valve regurgitation.  4. The aortic valve is normal in structure. Aortic valve regurgitation is not visualized  TEE - 1. Left ventricular ejection fraction, by estimation, is 50 to 55%. The left ventricle has low normal function. The left ventricle has no regional wall motion abnormalities.  2. Right ventricular systolic function is normal. The right ventricular size is normal.  3. No left atrial/left atrial appendage thrombus was detected.  4. The mitral valve is normal in structure. Trivial mitral valve regurgitation. No evidence of mitral stenosis.  5. The aortic valve is normal in structure. Aortic valve regurgitation is not visualized. No aortic stenosis is present.  6. The  inferior vena cava is normal in size with greater than 50% respiratory variability, suggesting right atrial pressure of 3 mmHg.  CT - 1. No acute intracranial abnormality. 2. Area of subcortical low-density involving the posterior left frontal lobe at the convexity, likely encephalomalacia and sequela of remote infarct. There are no prior exams for comparison to establish chronicity. MRI could be considered for further evaluation based on clinical concern. 3. Paranasal sinus fluid levels on opacification of the right greater than left mastoid air cells, possibly related to intubation    PUD Prophylaxis : PPI  Disposition Plan  :    Status is: Inpatient  Remains inpatient appropriate because:IV treatments appropriate due to intensity of illness or inability to take PO   Dispo: The patient is from: Home              Anticipated d/c is to: Home              Anticipated d/c date is: 1 day              Patient currently is medically stable to d/c. to CIR when bed is available.  DVT Prophylaxis  :  Lovenox    Lab Results  Component Value Date   PLT 297 11/08/2019    Diet :  Diet Order            Diet heart healthy/carb modified Room service appropriate? No; Fluid consistency: Thin  Diet effective now                  Inpatient Medications  Scheduled Meds:  alteplase  2 mg Intracatheter Once   apixaban  5 mg Oral BID   atorvastatin  40 mg Oral Daily   buPROPion  150 mg Oral Daily   Chlorhexidine Gluconate Cloth  6 each Topical Daily   docusate sodium  100 mg Oral BID   famotidine  10 mg Oral Daily   insulin aspart  0-15 Units Subcutaneous Q4H   insulin glargine  20 Units Subcutaneous Daily   levETIRAcetam  500 mg Oral BID   magnesium oxide  800 mg Oral BID   melatonin  3 mg Oral QHS   metoprolol tartrate  50 mg Oral BID   multivitamin with minerals  1 tablet Oral Daily   polyethylene glycol  17 g Oral Daily   Ensure Max Protein  11 oz Oral QHS    senna  2 tablet Oral BID   sodium chloride flush  10-40 mL Intracatheter Q12H   Continuous Infusions:  sodium chloride 10 mL/hr at 11/02/19 0600   nafcillin (NAFCIL) continuous infusion 12 g (11/09/19 1036)   PRN Meds:.sodium chloride, acetaminophen, bisacodyl, cyclobenzaprine, HYDROcodone-acetaminophen, Ipratropium-Albuterol, menthol-cetylpyridinium, zolpidem  Antibiotics  :    Anti-infectives (From admission, onward)   Start     Dose/Rate Route Frequency Ordered Stop   10/31/19 1415  nafcillin 12 g in sodium chloride 0.9 % 500 mL continuous infusion  Status:  Discontinued        12 g 20.8 mL/hr over 24 Hours Intravenous Every 24 hours 10/31/19 0608 10/31/19 0609   10/29/19 1415  nafcillin 12 g in dextrose 5 % 500 mL IVPB  Status:  Discontinued        12 g 20.8 mL/hr over 24 Hours Intravenous Every 24 hours 10/29/19 0434 10/29/19 0507   10/29/19 1415  nafcillin 12 g in sodium chloride 0.9 % 500 mL continuous infusion        12 g 20.8 mL/hr over 24 Hours Intravenous Every 24 hours 10/29/19 0507 11/22/19 2359   10/26/19 1415  nafcillin 12 g in dextrose 5 % 500 mL IVPB  Status:  Discontinued        12 g 20.8 mL/hr over 24 Hours Intravenous Every 24 hours 10/26/19 1414 10/29/19 0434   10/26/19 1400  nafcillin injection 12 g  Status:  Discontinued        12 g Intravenous Daily 10/26/19 1238 10/26/19 1242   10/26/19 1400  nafcillin 12 g in dextrose 5 % 50 mL IVPB  Status:  Discontinued        12 g 100 mL/hr over 30 Minutes Intravenous Every 24 hours 10/26/19 1242 10/26/19 1339   10/26/19 1400  nafcillin 12 g in dextrose 5 % 500 mL IVPB  Status:  Discontinued        12 g 20.8 mL/hr over 24 Hours Intravenous Every 24 hours 10/26/19 1337 10/26/19 1416   10/26/19 1200  nafcillin 2 g in sodium chloride 0.9 % 100 mL IVPB  Status:  Discontinued        2 g 200 mL/hr over 30 Minutes Intravenous  Every 4 hours 10/26/19 1010 10/26/19 1238   10/12/19 2200  cefTRIAXone (ROCEPHIN) 2 g in sodium  chloride 0.9 % 100 mL IVPB  Status:  Discontinued        2 g 200 mL/hr over 30 Minutes Intravenous Every 24 hours 10/12/19 0441 10/12/19 0921   10/12/19 0930  ceFAZolin (ANCEF) IVPB 2g/100 mL premix  Status:  Discontinued        2 g 200 mL/hr over 30 Minutes Intravenous Every 8 hours 10/12/19 0921 10/26/19 1010   10/11/19 1000  remdesivir 100 mg in sodium chloride 0.9 % 100 mL IVPB       "Followed by" Linked Group Details   100 mg 200 mL/hr over 30 Minutes Intravenous Daily 10/10/19 0050 10/14/19 0956   10/11/19 0800  vancomycin (VANCOCIN) IVPB 1000 mg/200 mL premix  Status:  Discontinued        1,000 mg 200 mL/hr over 60 Minutes Intravenous Every 8 hours 10/10/19 2239 10/12/19 0921   10/10/19 2230  vancomycin (VANCOCIN) IVPB 1000 mg/200 mL premix        1,000 mg 200 mL/hr over 60 Minutes Intravenous Every 1 hr x 2 10/10/19 2208 10/11/19 0052   10/10/19 2215  cefTRIAXone (ROCEPHIN) 2 g in sodium chloride 0.9 % 100 mL IVPB  Status:  Discontinued        2 g 200 mL/hr over 30 Minutes Intravenous Every 24 hours 10/10/19 2205 10/12/19 0441   10/10/19 0100  remdesivir 100 mg in sodium chloride 0.9 % 100 mL IVPB       "Followed by" Linked Group Details   100 mg 200 mL/hr over 30 Minutes Intravenous Every 30 min 10/10/19 0050 10/10/19 0332        Susa Raring M.D on 11/10/2019 at 10:24 AM  To page go to [email or web address] -  [facility] -  Office  [phone]   See all Orders from today for further details    Objective:   Vitals:   11/09/19 0420 11/09/19 1405 11/09/19 2001 11/10/19 0422  BP: 117/76 129/80 (!) 141/75 119/75  Pulse: 95 88 94 92  Resp: 19 20 18 17   Temp: 98.2 F (36.8 C) 98.2 F (36.8 C) 99.7 F (37.6 C) 98.3 F (36.8 C)  TempSrc: Oral  Oral Oral  SpO2: 91% 96% 94% 90%  Weight:    112.8 kg  Height:        Wt Readings from Last 3 Encounters:  11/10/19 112.8 kg  09/11/19 111.1 kg  05/12/17 (!) 145.2 kg     Intake/Output Summary (Last 24 hours) at  11/10/2019 1024 Last data filed at 11/10/2019 0936 Gross per 24 hour  Intake 626.6 ml  Output 1325 ml  Net -698.4 ml     Physical Exam  Awake Alert, No new F.N deficits, chronic bilateral rotator cuff injury induced bilateral upper extremity weakness in the deltoid area, per patient this is old and unchanged,  chronic foot wounds on both feet under bandage, right arm PICC line in place. [place].AT,PERRAL Supple Neck,No JVD, No cervical lymphadenopathy appriciated.  Symmetrical Chest wall movement, Good air movement bilaterally, CTAB RRR,No Gallops, Rubs or new Murmurs, No Parasternal Heave +ve B.Sounds, Abd Soft, No tenderness, No organomegaly appriciated, No rebound - guarding or rigidity.      Data Review:    CBC Recent Labs  Lab 11/05/19 0356 11/06/19 0339 11/07/19 0350 11/08/19 0450  WBC 9.6 9.7 8.1 7.7  HGB 10.1* 10.2* 9.4* 9.3*  HCT 30.9* 32.3* 29.0* 29.2*  PLT 353 354 305 297  MCV 92.8 94.4 92.9 92.1  MCH 30.3 29.8 30.1 29.3  MCHC 32.7 31.6 32.4 31.8  RDW 13.2 13.4 13.6 13.5  LYMPHSABS 3.0 3.1 2.5 2.4  MONOABS 0.8 0.7 0.8 0.7  EOSABS 0.5 0.5 0.4 0.3  BASOSABS 0.0 0.0 0.0 0.0    Recent Labs  Lab 11/05/19 0356 11/05/19 0356 11/05/19 0357 11/06/19 0339 11/07/19 0350 11/08/19 0450 11/08/19 1144 11/09/19 0430 11/10/19 0437  NA 135   < >  --  134* 136 134*  --  133* 135  K 4.0   < >  --  4.0 3.7 3.8  --  3.7 3.7  CL 100   < >  --  98 101 99  --  97* 98  CO2 25   < >  --  26 25 25   --  26 27  GLUCOSE 155*   < >  --  163* 141* 136*  --  139* 147*  BUN 5*   < >  --  <5* <5* 5*  --  6 5*  CREATININE 0.76   < >  --  0.78 0.75 0.70  --  0.71 0.72  CALCIUM 8.9   < >  --  8.9 8.6* 8.7*  --  8.7* 8.7*  AST 25  --   --  21 17 20   --   --   --   ALT 17  --   --  18 17 18   --   --   --   ALKPHOS 84  --   --  89 80 83  --   --   --   BILITOT 0.7  --   --  0.7 0.8 0.9  --   --   --   ALBUMIN 2.2*  --   --  2.3* 2.1* 2.2*  --   --   --   MG 1.6*   < >  --  1.8 1.5* 1.7   --  1.6* 2.0  CRP 9.8*  --   --  11.4* 9.8* 10.4*  --   --   --   DDIMER 5.95*  --   --   --   --   --   --   --   --   PROCALCITON <0.10  --   --   --   --   --   --   --   --   BNP  --   --  36.0  --   --   --  36.1 28.8  --    < > = values in this interval not displayed.    ------------------------------------------------------------------------------------------------------------------ No results for input(s): CHOL, HDL, LDLCALC, TRIG, CHOLHDL, LDLDIRECT in the last 72 hours.  Lab Results  Component Value Date   HGBA1C 11.3 (H) 10/26/2019   ------------------------------------------------------------------------------------------------------------------ No results for input(s): TSH, T4TOTAL, T3FREE, THYROIDAB in the last 72 hours.  Invalid input(s): FREET3  Cardiac Enzymes No results for input(s): CKMB, TROPONINI, MYOGLOBIN in the last 168 hours.  Invalid input(s): CK ------------------------------------------------------------------------------------------------------------------    Component Value Date/Time   BNP 28.8 11/09/2019 0430    Micro Results No results found for this or any previous visit (from the past 240 hour(s)).  Radiology Reports CT HEAD WO CONTRAST  Result Date: 10/19/2019 CLINICAL DATA:  Deliriums.  Mental status change. EXAM: CT HEAD WITHOUT CONTRAST TECHNIQUE: Contiguous axial images were obtained from the base of the skull through the vertex without intravenous contrast.  COMPARISON:  None. FINDINGS: Brain: Area subcortical low-density involving the posterior left frontal lobe at the convexity, series 3, image 25 and series 5, image 49, likely encephalomalacia and sequela of remote infarct, however nonspecific. No hemorrhage. No evidence of acute ischemia. No hydrocephalus, midline shift or mass effect. No subdural or extra-axial collection. Vascular: Atherosclerosis of skullbase vasculature without hyperdense vessel or abnormal calcification. Skull: No  fracture or focal lesion. Sinuses/Orbits: Nasogastric tube in place. Fluid levels in the sphenoid sinuses and right maxillary sinus with scattered opacification of ethmoid air cells may be related to intubation. Opacification of the right greater than left mastoid air cells. Other: None. IMPRESSION: 1. No acute intracranial abnormality. 2. Area of subcortical low-density involving the posterior left frontal lobe at the convexity, likely encephalomalacia and sequela of remote infarct. There are no prior exams for comparison to establish chronicity. MRI could be considered for further evaluation based on clinical concern. 3. Paranasal sinus fluid levels on opacification of the right greater than left mastoid air cells, possibly related to intubation. Electronically Signed   By: Narda Rutherford M.D.   On: 10/19/2019 15:30   CT ANGIO CHEST PE W OR WO CONTRAST  Result Date: 10/23/2019 CLINICAL DATA:  COVID pneumonia, acute hypoxic respiratory failure, positive D-dimer EXAM: CT ANGIOGRAPHY CHEST WITH CONTRAST TECHNIQUE: Multidetector CT imaging of the chest was performed using the standard protocol during bolus administration of intravenous contrast. Multiplanar CT image reconstructions and MIPs were obtained to evaluate the vascular anatomy. CONTRAST:  75mL OMNIPAQUE IOHEXOL 350 MG/ML SOLN COMPARISON:  None. FINDINGS: Cardiovascular: Satisfactory opacification of the pulmonary arteries to the segmental level. No evidence of pulmonary embolism. The central pulmonary arteries are of normal caliber. There is moderate coronary artery calcification. Normal heart size. No pericardial effusion. The thoracic aorta is unremarkable save for bovine arch anatomy. Right upper extremity PICC line tip is seen within the superior vena cava. Mediastinum/Nodes: No enlarged mediastinal, hilar, or axillary lymph nodes. Thyroid gland, trachea, and esophagus demonstrate no significant findings. Nasoenteric feeding tube extends into the  upper abdomen beyond the margin of the examination. Lungs/Pleura: Lung volumes are small. Evaluation is limited by motion artifact, however, multifocal pulmonary infiltrates are identified demonstrating a peripheral and basal predominance, likely infectious or inflammatory in the acute setting. No central obstructing lesion. No pneumothorax or pleural effusion. No superimposed interstitial edema. Upper Abdomen: No acute abnormality. Musculoskeletal: No chest wall abnormality. No acute or significant osseous findings. Review of the MIP images confirms the above findings. IMPRESSION: 1. No evidence of pulmonary embolism. 2. Multifocal pulmonary infiltrates are identified demonstrating a peripheral and basal predominance, likely infectious or inflammatory in the acute setting. 3. Moderate coronary artery calcification. Electronically Signed   By: Helyn Numbers MD   On: 10/23/2019 16:25   MR ANGIO HEAD WO CONTRAST  Result Date: 10/29/2019 CLINICAL DATA:  Neuro deficit with stroke suspected EXAM: MRA HEAD WITHOUT CONTRAST TECHNIQUE: Angiographic images of the Circle of Willis were obtained using MRA technique without intravenous contrast. COMPARISON:  Four days ago FINDINGS: History of bacteremia and embolic infarction. No vessel beading, stenosis, or pseudoaneurysm. IMPRESSION: Negative intracranial MRA. Electronically Signed   By: Marnee Spring M.D.   On: 10/29/2019 12:04   MR BRAIN WO CONTRAST  Result Date: 10/25/2019 CLINICAL DATA:  Neuro deficit, acute, stroke suspected. Additional history provided: COVID positive. Additional history obtained from electronic MEDICAL [facility] NUMBERSepsis with MSSA and group B strep bacteremia. EXAM: MRI HEAD WITHOUT CONTRAST TECHNIQUE: Multiplanar, multiecho pulse sequences of  the brain and surrounding structures were obtained without intravenous contrast. COMPARISON:  Head CT 10/19/2019. FINDINGS: Brain: The examination is limited by poor signal on several sequences as well as  motion degradation. Most notably, there is moderate motion degradation of the axial T2 weighted sequence, severe motion degradation of the axial SWI sequence and moderate motion degradation of the coronal T2 weighted sequence. Cerebral volume is normal for age. There is a 17 mm focus of restricted diffusion and corresponding T2/FLAIR hyperintensity within the subcortical left frontoparietal lobes (for instance as seen on series 2, image 39) (series 3, image 17). Additional mild scattered T2/FLAIR hyperintensity within the cerebral white matter and pons is nonspecific, but consistent with chronic small vessel ischemic disease. Severe motion degradation of the axial SWI sequence precludes adequate evaluation for intracranial blood products. No extra-axial fluid collection. No midline shift. Vascular: Expected proximal arterial flow voids. Skull and upper cervical spine: No focal marrow lesion is identified within described limitations. Sinuses/Orbits: Visualized orbits show no acute finding. Air-fluid levels within the sphenoid and right maxillary sinuses. Partial opacification of right ethmoid air cells. Bilateral mastoid effusions. These results will be called to the ordering clinician or representative by the Radiologist Assistant, and communication documented in the PACS or [employer]. IMPRESSION: Examination limited by poor signal on several sequences as well as motion degradation, as described. 17 mm focus of restricted diffusion and T2/FLAIR hyperintensity within the subcortical left frontoparietal lobes. This finding is nonspecific, but given the patient's history, the primary differential considerations are acute/early subacute infarct versus cerebritis. Consider contrast-enhanced MR imaging of the brain for further evaluation of this lesion. Mild chronic small vessel ischemic disease. Paranasal sinus disease with air-fluid levels. Bilateral mastoid effusions. Electronically Signed   By: Jackey Loge DO    On: 10/25/2019 15:16   MR BRAIN W CONTRAST  Result Date: 10/25/2019 CLINICAL DATA:  Brain mass follow-up EXAM: MRI HEAD WITH CONTRAST TECHNIQUE: Multiplanar, multiecho pulse sequences of the brain and surrounding structures were obtained with intravenous contrast. CONTRAST:  10mL GADAVIST GADOBUTROL 1 MMOL/ML IV SOLN COMPARISON:  Brain MRI without contrast 10/25/2019 FINDINGS: Brain: There is a peripherally enhancing lesion at the base of the left precentral gyrus that measures 0.9 x 0.7 x 1.8 cm. There are no other areas of abnormal contrast enhancement. 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 Electronically Signed   By: Deatra Robinson M.D.   On: 10/25/2019 22:47   MR FOOT LEFT WO CONTRAST  Result Date: 10/25/2019 CLINICAL DATA:  Bacteremia left foot pain and diabetic EXAM: MRI OF THE LEFT FOOT WITHOUT CONTRAST TECHNIQUE: Multiplanar, multisequence MR imaging of the left was performed. No intravenous contrast was administered. COMPARISON:  October 23, 2019 FINDINGS: Bones/Joint/Cartilage Mildly angulated nondisplaced fractures of the midshaft of the fourth and fifth metatarsals are noted. There is periosteal reaction seen along the medial margins of the metatarsal shafts. Increased heterogeneous T2 hyperintense signal with subtle T1 hypointensity seen at the base of the fourth and fifth metatarsals. No other osseous marrow signal abnormality is seen. There is no large knee joint effusions noted. The articular surfaces appear to be maintained. Ligaments The Lisfranc ligament is intact. Muscles and Tendons Increased feathery signal with atrophy is seen throughout the muscles of the forefoot. There is a small amount of fluid seen surrounding the posterior tibialis tendon. The remainder of the flexor and extensor tendons are intact. Soft tissues Lateral plantar surface of  the forefoot overlying the fifth  metatarsal base there is a focal area of ulceration measuring approximately 8 mm in transverse dimension. A fluid-filled sinus tract is seen extending to the overlying osseous surface. There is a multilocular fluid collection which extends around the dorsal surface of the fifth metatarsal measuring approximately 5.4 x 0.9 x 3.4 cm. There is extensive dorsal and lateral subcutaneous edema and skin thickening. IMPRESSION: Superficial area of ulceration over the plantar lateral aspect of the fifth metatarsal base with a fluid-filled sinus tract and loculated probable abscess extending over the dorsal surface of the fifth metatarsal measuring 5.4 x 0.9 by is a 3.4 cm. Incomplete mildly angulated fourth and fifth metatarsal shaft fractures with periosteal reaction Findings which could be suggestive of reactive marrow versus early osteomyelitis involving the base of the fourth and fifth metatarsals. Electronically Signed   By: Jonna Clark M.D.   On: 10/25/2019 19:06   DG Chest Port 1 View  Result Date: 11/09/2019 CLINICAL DATA:  Shortness of breath and cough EXAM: PORTABLE CHEST 1 VIEW COMPARISON:  October 26, 2019 FINDINGS: Central catheter tip is in the superior vena cava. No pneumothorax. There is no edema or airspace opacity. Heart size and pulmonary vascularity are normal. No adenopathy. No bone lesions. IMPRESSION: Central catheter tip in superior vena cava. No pneumothorax. Lungs clear. Cardiac silhouette normal. Electronically Signed   By: Bretta Bang III M.D.   On: 11/09/2019 13:51   DG Chest Port 1 View  Result Date: 10/26/2019 CLINICAL DATA:  Short of breath.  COVID positive EXAM: PORTABLE CHEST 1 VIEW COMPARISON:  10/25/2019 FINDINGS: Hypoventilation with decreased lung volume. Mild bibasilar airspace disease unchanged. No new infiltrate or effusion. Right arm PICC tip in the SVC unchanged. IMPRESSION: Hypoventilation with bibasilar mild airspace disease unchanged. Electronically Signed   By:  Marlan Palau M.D.   On: 10/26/2019 16:10   DG Chest Port 1 View  Result Date: 10/25/2019 CLINICAL DATA:  Shortness of breath. EXAM: PORTABLE CHEST 1 VIEW COMPARISON:  October 16, 2019. FINDINGS: The heart size and mediastinal contours are within normal limits. Hypoinflation of the lungs is noted with mild bibasilar subsegmental atelectasis. Endotracheal tube has been removed. Right-sided PICC line is unchanged in position. The visualized skeletal structures are unremarkable. IMPRESSION: Hypoinflation of the lungs with mild bibasilar subsegmental atelectasis. Electronically Signed   By: Lupita Raider M.D.   On: 10/25/2019 08:37   DG CHEST PORT 1 VIEW  Result Date: 10/16/2019 CLINICAL DATA:  Status post intubation. EXAM: PORTABLE CHEST 1 VIEW COMPARISON:  10/14/2019 FINDINGS: ET tube tip is above the carina. There is a right arm PICC line with tip at the cavoatrial junction. Lung volumes are low. Similar appearance of bilateral pulmonary opacities compatible with multifocal pneumonia. IMPRESSION: 1. Stable support apparatus. 2. Persistent bilateral pulmonary opacities compatible with multifocal pneumonia. Electronically Signed   By: Signa Kell M.D.   On: 10/16/2019 06:14   DG Chest Port 1 View  Result Date: 10/15/2019 CLINICAL DATA:  49 year old male with history of COVID infection. EXAM: PORTABLE CHEST 1 VIEW COMPARISON:  Chest x-ray 10/14/2019. FINDINGS: An endotracheal tube is in place with tip 3.6 cm above the carina. A nasogastric tube is seen extending into the stomach, however, the tip of the nasogastric tube extends below the lower margin of the image. There is a right upper extremity PICC with tip terminating in the right atrium. Lung volumes are low. Widespread patchy areas of interstitial prominence an ill-defined airspace disease again  noted throughout the lungs bilaterally. Overall, aeration is essentially unchanged. No pleural effusions. No evidence of pulmonary edema. No pneumothorax.  Heart size is normal. Upper mediastinal contours are within normal limits. IMPRESSION: 1. Support apparatus, as above. 2. The appearance the chest is compatible with multilobar pneumonia from reported COVID infection. Electronically Signed   By: Trudie Reed M.D.   On: 10/15/2019 05:39   DG Chest Port 1 View  Result Date: 10/14/2019 CLINICAL DATA:  COVID-19, ARDS EXAM: PORTABLE CHEST 1 VIEW COMPARISON:  10/13/2019 FINDINGS: Single frontal view of the chest demonstrates stable position of the endotracheal tube and enteric catheter. There is a right-sided PICC tip overlying superior vena cava. The cardiac silhouette is stable. Interstitial and ground-glass opacities are seen throughout the lungs bilaterally, unchanged. Lung volumes are diminished. No effusion or pneumothorax. IMPRESSION: 1. 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 2. Stable support devices. Electronically Signed   By: Sharlet Salina M.D.   On: 10/14/2019 15:57   DG Chest Port 1 View  Result Date: 10/13/2019 CLINICAL DATA:  Acute respiratory failure with hypoxemia, COVID positive, asthma, diabetes mellitus, hypertension, CHF EXAM: PORTABLE CHEST 1 VIEW COMPARISON:  Portable exam 1021 hours compared to 10/12/2019 FINDINGS: Tip of endotracheal tube projects 3.7 cm above carina. Nasogastric tube extends into stomach. RIGHT arm PICC line tip projects over cavoatrial junction. Stable heart size mediastinal contours for technique. Patchy BILATERAL airspace infiltrates consistent with multifocal pneumonia, minimally improved. No pleural effusion or pneumothorax. IMPRESSION: Minimally improved pulmonary infiltrates. Electronically Signed   By: Ulyses Southward M.D.   On: 10/13/2019 10:37   DG CHEST PORT 1 VIEW  Result Date: 10/12/2019 CLINICAL DATA:  Intubation EXAM: PORTABLE CHEST 1 VIEW COMPARISON:  Earlier today FINDINGS: New endotracheal tube with tip at  the clavicular heads, measuring 3 cm above the carina. Low volume chest with severe airspace disease. No visible effusion or air leak. Stable heart size which is distorted by positioning and volumes. IMPRESSION: 1. Unremarkable positioning of the endotracheal tube. 2. Stable extensive airspace disease with low lung volumes. Electronically Signed   By: Marnee Spring M.D.   On: 10/12/2019 07:13   DG CHEST PORT 1 VIEW  Result Date: 10/12/2019 CLINICAL DATA:  Shortness of breath EXAM: PORTABLE CHEST 1 VIEW COMPARISON:  Three days ago FINDINGS: Very low volume chest with diffuse and patchy pulmonary opacity. Vascular pedicle widening and prominent heart size largely related to technique. No visible effusion or pneumothorax. IMPRESSION: Worsening multifocal pneumonia.  Lung volumes remain very low. Electronically Signed   By: Marnee Spring M.D.   On: 10/12/2019 04:07   DG Abd Portable 1V  Result Date: 10/20/2019 CLINICAL DATA:  GI problem. EXAM: PORTABLE ABDOMEN - 1 VIEW COMPARISON:  10/16/2019 FINDINGS: Feeding tube tip is in the distal stomach or proximal duodenum. Mild gaseous distention of the stomach. Gas throughout nondistended large and small bowel. No evidence of bowel obstruction, organomegaly or free air. IMPRESSION: Feeding tube tip in the distal stomach or proximal duodenum. No evidence of obstruction or free air. Electronically Signed   By: Charlett Nose M.D.   On: 10/20/2019 21:28   DG Abd Portable 1V  Result Date: 10/16/2019 CLINICAL DATA:  Status post OG tube placement EXAM: PORTABLE ABDOMEN - 1 VIEW COMPARISON:  10/16/2019 FINDINGS: The enteric tube tip projects over the distal body of stomach in the side port is below the level of the GE junction. A few  air-filled loops of small bowel are noted within the upper abdomen which measure up to 2.8 cm. IMPRESSION: Satisfactory position of enteric tube. Electronically Signed   By: Signa Kell M.D.   On: 10/16/2019 06:52   DG Abd Portable  1V  Result Date: 10/12/2019 CLINICAL DATA:  Onychogryphosis EXAM: PORTABLE ABDOMEN - 1 VIEW COMPARISON:  Portable exam 0830 hours without priors for comparison FINDINGS: Tip of nasogastric tube projects over distal antrum. Nonobstructive bowel gas pattern. No bowel dilatation or bowel wall thickening. Patchy bibasilar infiltrates. No acute osseous findings. IMPRESSION: Tip of nasogastric tube projects over distal gastric antrum. Nonobstructive bowel gas pattern. Bibasilar pulmonary infiltrates. Electronically Signed   By: Ulyses Southward M.D.   On: 10/12/2019 08:51   DG Foot 2 Views Left  Result Date: 10/23/2019 CLINICAL DATA:  Bacteremia, LEFT foot pain and diabetic. EXAM: LEFT FOOT - 2 VIEW COMPARISON:  10/13/2019 and prior radiographs FINDINGS: Mildly angulated fractures of the 4th and 5th metatarsals are again noted with fracture lines still evident. Healing changes along these fractures are noted. Overlying soft tissue swelling is noted. No new fracture, subluxation or dislocation noted. No definite radiographic evidence of acute osteomyelitis noted. IMPRESSION: Mildly angulated healing fractures of the 4th and 5th metatarsals with soft tissue swelling again noted but without definite radiographic evidence of acute osteomyelitis. Consider MRI for further evaluation as clinically indicated. Electronically Signed   By: Harmon Pier M.D.   On: 10/23/2019 13:40   DG Foot 2 Views Left  Result Date: 10/13/2019 CLINICAL DATA:  49 year old male with diabetic foot. EXAM: LEFT FOOT - 2 VIEW COMPARISON:  Left foot radiograph dated 09/11/2019. FINDINGS: Evaluation is limited due to positioning. Fractures of the midportion of the fourth and fifth metacarpal with interval progression of bridging callus formation. No new fracture identified. There is no dislocation. There is diffuse soft tissue swelling of the foot. No radiopaque foreign object or soft tissue gas. IMPRESSION: 1. Healing fractures of the fourth and fifth  metacarpal. 2. Diffuse soft tissue swelling. No radiopaque foreign object or soft tissue gas. Electronically Signed   By: Elgie Collard M.D.   On: 10/13/2019 16:49   EEG adult  Result Date: 10/27/2019 Charlsie Quest, MD     10/28/2019 10:13 AM Patient Name: MARCE CHARLESWORTH MRN: [id number] Epilepsy Attending: Charlsie Quest Referring Physician/Provider: Dr. [name] Date: 10/27/2019 Duration: 22.22 minutes Patient history: 49 year old male with altered mental status. EEG evaluate for seizures. Level of alertness: Awake, asleep AEDs during EEG study: Keppra Technical aspects: This EEG study was done with scalp electrodes positioned according to the 10-20 International system of electrode placement. Electrical activity was acquired at a sampling rate of 500Hz  and reviewed with a high frequency filter of 70Hz  and a low frequency filter of 1Hz . EEG data were recorded continuously and digitally stored. Description: The posterior dominant rhythm consists of 8 Hz activity of moderate voltage (25-35 uV) seen predominantly in posterior head regions, symmetric and reactive to eye opening and eye closing. Sleep was characterized by vertex waves, sleep spindles (12 to 14 Hz), maximal frontocentral region. Frequent runs of sharp waves were seen in frontocentral region, maximal vertex lasting about 5 to 6 seconds consistent with brief ictal-interictal rhythmic discharges. Continuous generalized 5 to 6 Hz theta slowing was also noted. Hyperventilation and photic stimulation were not performed.   ABNORMALITY -Continuous slow, generalized -Brief ictal-interictal rhythmic discharges, bilateral frontocentral region, maximal vertex IMPRESSION: This study showed evidence of potential epileptogenicity arising from bilateral  frontocentral, maximal vertex region. The sharp waves are at times rhythmic consistent with brief ictal-interictal rhythmic discharges. Additionally, there is evidence of mild diffuse encephalopathy,  nonspecific etiology. Priyanka [name]   Overnight EEG with video  Result Date: 10/28/2019 Charlsie Quest, MD     10/29/2019  8:27 AM Patient Name: MARQUAY KRUSE MRN: [id number] Epilepsy Attending: Charlsie Quest Referring Physician/Provider: Dr. [name] Duration: 10/27/2019 1145 to 10/28/2019 1145  Patient history: 49 year old male with altered mental status. EEG evaluate for seizures.  Level of alertness: Awake, asleep AEDs during EEG study: Keppra  Technical aspects: This EEG study was done with scalp electrodes positioned according to the 10-20 International system of electrode placement. Electrical activity was acquired at a sampling rate of 500Hz  and reviewed with a high frequency filter of 70Hz  and a low frequency filter of 1Hz . EEG data were recorded continuously and digitally stored.  Description: The posterior dominant rhythm consists of 8 Hz activity of moderate voltage (25-35 uV) seen predominantly in posterior head regions, symmetric and reactive to eye opening and eye closing. Sleep was characterized by vertex waves, sleep spindles (12 to 14 Hz), maximal frontocentral region. Initially, frequent runs of sharp waves were seen in frontocentral region, maximal vertex lasting about 5 to 6 seconds consistent with brief ictal-interictal rhythmic discharges. Continuous generalized 5 to 6 Hz theta slowing as well as intermittent generalized rhythmic 2-3hz  delta slowing  was also noted. Hyperventilation and photic stimulation were not performed.    ABNORMALITY -Continuous slow, generalized - Intermittent rhythmic delta slowing, generalized -Brief ictal-interictal rhythmic discharges, bilateral frontocentral region, maximal vertex  IMPRESSION: This study initially showed evidence of potential epileptogenicity arising from bilateral frontocentral, maximal vertex region. The sharp waves are at times rhythmic consistent with brief ictal-interictal rhythmic discharges. Additionally, there is  evidence of mild diffuse encephalopathy, nonspecific etiology. No seizures were seen during thi study. EEG appears to be improving compared to previous day.  Charlsie Quest   ECHOCARDIOGRAM COMPLETE  Result Date: 10/12/2019    ECHOCARDIOGRAM REPORT   Patient Name:   KYSER WANDEL Date of Exam: 10/12/2019 Medical Rec #:  [id number]      Height:       74.0 in Accession #:    [id number]     Weight:       245.0 lb Date of Birth:  11-07-1970      BSA:          2.369 m Patient Age:    49 years       BP:           111/74 mmHg Patient Gender: M              HR:           75 bpm. Exam Location:  Inpatient Procedure: 2D Echo Indications:    CHF 428                  & Bacteremia 790.7  History:        Patient has no prior history of Echocardiogram examinations.                 CHF; Risk Factors:Hypertension and Diabetes. Covid +.  Sonographer:    Celene Skeen RDCS (AE) Referring Phys: [id number] Clarene Critchley [facility]  Sonographer Comments: Echo performed with patient supine and on artificial respirator. Image acquisition challenging due to respiratory motion and Image acquisition challenging due to patient body habitus. restricted mobility IMPRESSIONS  1. Extremely  poor acoustic windows. LVEF appears to be depressed with diffuse hypokinesis, worse in the mid/distal inferior/inferoseptal walls. . Left ventricular ejection fraction, by estimation, is 30 to 35%. The left ventricle has moderately decreased function. The left ventricular internal cavity size was mildly dilated. Left ventricular diastolic parameters are indeterminate.  2. Right ventricular systolic function is moderately reduced. The right ventricular size is mildly enlarged.  3. The mitral valve is normal in structure. Trivial mitral valve regurgitation.  4. The aortic valve is normal in structure. Aortic valve regurgitation is not visualized. FINDINGS  Left Ventricle: Extremely poor acoustic windows. LVEF appears to be depressed with diffuse hypokinesis, worse in the  mid/distal inferior/inferoseptal walls. Left ventricular ejection fraction, by estimation, is 30 to 35%. The left ventricle has moderately decreased function. The left ventricle demonstrates regional wall motion abnormalities. The left ventricular internal cavity size was mildly dilated. There is no left ventricular hypertrophy. Left ventricular diastolic parameters are indeterminate. Right Ventricle: The right ventricular size is mildly enlarged. Right vetricular wall thickness was not assessed. Right ventricular systolic function is moderately reduced. Left Atrium: Left atrial size was normal in size. Right Atrium: Right atrial size was normal in size. Pericardium: There is no evidence of pericardial effusion. Mitral Valve: The mitral valve is normal in structure. Trivial mitral valve regurgitation. Tricuspid Valve: The tricuspid valve is normal in structure. Tricuspid valve regurgitation is trivial. Aortic Valve: The aortic valve is normal in structure. Aortic valve regurgitation is not visualized. Pulmonic Valve: The pulmonic valve was grossly normal. Pulmonic valve regurgitation is trivial. Aorta: The aortic root is normal in size and structure. IAS/Shunts: The interatrial septum was not assessed.  LEFT VENTRICLE PLAX 2D LVIDd:         5.60 cm  Diastology LVIDs:         3.90 cm  LV e' lateral:   8.81 cm/s LV PW:         1.40 cm  LV E/e' lateral: 5.7 LV IVS:        1.10 cm  LV e' medial:    5.87 cm/s LVOT diam:     2.40 cm  LV E/e' medial:  8.5 LV SV:         64 LV SV Index:   27 LVOT Area:     4.52 cm  RIGHT VENTRICLE RV S prime:     9.79 cm/s TAPSE (M-mode): 1.3 cm LEFT ATRIUM             Index       RIGHT ATRIUM           Index LA diam:        3.10 cm 1.31 cm/m  RA Area:     18.80 cm LA Vol (A2C):   62.9 ml 26.55 ml/m RA Volume:   51.80 ml  21.86 ml/m LA Vol (A4C):   40.8 ml 17.22 ml/m LA Biplane Vol: 50.8 ml 21.44 ml/m  AORTIC VALVE LVOT Vmax:   69.80 cm/s LVOT Vmean:  49.400 cm/s LVOT VTI:    0.142 m   AORTA Ao Root diam: 3.60 cm MITRAL VALVE MV Area (PHT): 2.34 cm    SHUNTS MV Decel Time: 324 msec    Systemic VTI:  0.14 m MV E velocity: 50.10 cm/s  Systemic Diam: 2.40 cm MV A velocity: 71.60 cm/s MV E/A ratio:  0.70 Dietrich Pates MD Electronically signed by Dietrich Pates MD Signature Date/Time: 10/12/2019/1:58:41 PM    Final    ECHO TEE  Result  Date: 10/24/2019    TRANSESOPHOGEAL ECHO REPORT   Patient Name:   MILLS MITTON Date of Exam: 10/24/2019 Medical Rec #:  [id number]      Height:       74.0 in Accession #:    [id number]     Weight:       247.4 lb Date of Birth:  20-Nov-1970      BSA:          2.379 m Patient Age:    49 years       BP:           120/80 mmHg Patient Gender: M              HR:           100 bpm. Exam Location:  Inpatient Procedure: Transesophageal Echo Indications:    Emobli/Bacteremia  History:        Patient has prior history of Echocardiogram examinations.  Sonographer:    Ross Ludwig RDCS (AE) Referring Phys: [phone] HAO MENG PROCEDURE: The transesophogeal probe was passed without difficulty through the esophogus of the patient. Sedation performed by different physician. The patient's vital signs; including heart rate, blood pressure, and oxygen saturation; remained stable throughout the procedure. The patient developed no complications during the procedure. IMPRESSIONS  1. Left ventricular ejection fraction, by estimation, is 50 to 55%. The left ventricle has low normal function. The left ventricle has no regional wall motion abnormalities.  2. Right ventricular systolic function is normal. The right ventricular size is normal.  3. No left atrial/left atrial appendage thrombus was detected.  4. The mitral valve is normal in structure. Trivial mitral valve regurgitation. No evidence of mitral stenosis.  5. The aortic valve is normal in structure. Aortic valve regurgitation is not visualized. No aortic stenosis is present.  6. The inferior vena cava is normal in size with greater than 50%  respiratory variability, suggesting right atrial pressure of 3 mmHg. Conclusion(s)/Recommendation(s): Normal biventricular function without evidence of hemodynamically significant valvular heart disease. FINDINGS  Left Ventricle: Left ventricular ejection fraction, by estimation, is 50 to 55%. The left ventricle has low normal function. The left ventricle has no regional wall motion abnormalities. The left ventricular internal cavity size was normal in size. There is no left ventricular hypertrophy. Right Ventricle: The right ventricular size is normal. No increase in right ventricular wall thickness. Right ventricular systolic function is normal. Left Atrium: Left atrial size was normal in size. No left atrial/left atrial appendage thrombus was detected. Right Atrium: Right atrial size was normal in size. Pericardium: There is no evidence of pericardial effusion. Mitral Valve: The mitral valve is normal in structure. Normal mobility of the mitral valve leaflets. Trivial mitral valve regurgitation. No evidence of mitral valve stenosis. There is no evidence of mitral valve vegetation. Tricuspid Valve: The tricuspid valve is normal in structure. Tricuspid valve regurgitation is mild . No evidence of tricuspid stenosis. There is no evidence of tricuspid valve vegetation. Aortic Valve: The aortic valve is normal in structure. Aortic valve regurgitation is not visualized. No aortic stenosis is present. There is no evidence of aortic valve vegetation. Pulmonic Valve: The pulmonic valve was normal in structure. Pulmonic valve regurgitation is trivial. No evidence of pulmonic stenosis. Aorta: The aortic root is normal in size and structure. Venous: The inferior vena cava is normal in size with greater than 50% respiratory variability, suggesting right atrial pressure of 3 mmHg. IAS/Shunts: No atrial level shunt detected by color flow  Doppler. [name] MD Electronically signed by [name] MD Signature Date/Time:  10/24/2019/3:28:45 PM    Final    VAS US CAROTID (at [facility] and [facility] only)  Result Date: 10/26/2019 Carotid Arterial Duplex Study Indications:       17mm focus of diffusion restriction in the left                    frontoparietal lobe concerning for acute/early subacute                    infarct vs cerebritis. Risk Factors:      Hypertension, Diabetes. Other Factors:     Covid-19, bacteremia. Comparison Study:  No prior study on file Performing Technologist: Sherren Kerns RVS  Examination Guidelines: A complete evaluation includes B-mode imaging, spectral Doppler, color Doppler, and power Doppler as needed of all accessible portions of each vessel. Bilateral testing is considered an integral part of a complete examination. Limited examinations for reoccurring indications may be performed as noted.  Right Carotid Findings: +----------+--------+--------+--------+------------------+--------+             PSV cm/s EDV cm/s Stenosis Plaque Description Comments  +----------+--------+--------+--------+------------------+--------+  CCA Prox   66       11                                             +----------+--------+--------+--------+------------------+--------+  CCA Distal 71       18                                             +----------+--------+--------+--------+------------------+--------+  ICA Prox   58       19                                             +----------+--------+--------+--------+------------------+--------+  ICA Distal 90       33                                             +----------+--------+--------+--------+------------------+--------+  ECA        114      16                                             +----------+--------+--------+--------+------------------+--------+ +----------+--------+-------+--------+-------------------+             PSV cm/s EDV cms Describe Arm Pressure (mmHG)  +----------+--------+-------+--------+-------------------+  Subclavian 157                                             +----------+--------+-------+--------+-------------------+ +---------+--------+--+--------+--+  Vertebral PSV cm/s 38 EDV cm/s 11  +---------+--------+--+--------+--+  Left Carotid Findings: +----------+--------+--------+--------+------------------+--------+             PSV cm/s EDV cm/s Stenosis Plaque Description Comments  +----------+--------+--------+--------+------------------+--------+  CCA Prox   55       14                                             +----------+--------+--------+--------+------------------+--------+  CCA Distal 65       20                                             +----------+--------+--------+--------+------------------+--------+  ICA Prox   68       20                                             +----------+--------+--------+--------+------------------+--------+  ICA Distal 76       23                                             +----------+--------+--------+--------+------------------+--------+  ECA        77       3                                              +----------+--------+--------+--------+------------------+--------+ +----------+--------+--------+--------+-------------------+             PSV cm/s EDV cm/s Describe Arm Pressure (mmHG)  +----------+--------+--------+--------+-------------------+  Subclavian 88                                              +----------+--------+--------+--------+-------------------+ +---------+--------+--+--------+--+  Vertebral PSV cm/s 63 EDV cm/s 18  +---------+--------+--+--------+--+   Summary: Right Carotid: The extracranial vessels were near-normal with only minimal wall                thickening or plaque. Left Carotid: The extracranial vessels were near-normal with only minimal wall               thickening or plaque. Vertebrals:  Bilateral vertebral arteries demonstrate antegrade flow. Subclavians: Normal flow hemodynamics were seen in bilateral subclavian              arteries. *See table(s) above for measurements and observations.   Electronically signed by Delia Heady MD on 10/26/2019 at 12:37:48 PM.    Final    VAS [place] LOWER EXTREMITY ARTERIAL DUPLEX  Result Date: 10/26/2019 LOWER EXTREMITY ARTERIAL DUPLEX STUDY Indications: Ulceration, and Osteomyelitis. High Risk Factors: Hypertension, Diabetes. Other Factors: Covid-19. Charcot foot.  Current ABI: N/A Comparison Study: No prior study Performing Technologist: Sherren Kerns RVS  Examination Guidelines: A complete evaluation includes B-mode imaging, spectral Doppler, color Doppler, and power Doppler as needed of all accessible portions of each vessel. Bilateral testing is considered an integral part of a complete examination. Limited examinations for reoccurring indications may be performed as noted.  +----------+--------+-----+--------+---------+--------+  LEFT       PSV cm/s Ratio Stenosis Waveform  Comments  +----------+--------+-----+--------+---------+--------+  CFA Prox   123                     triphasic           +----------+--------+-----+--------+---------+--------+  DFA        51                      triphasic           +----------+--------+-----+--------+---------+--------+  SFA Prox   97                      triphasic           +----------+--------+-----+--------+---------+--------+  SFA Mid    88                      triphasic           +----------+--------+-----+--------+---------+--------+  SFA Distal 81                      triphasic           +----------+--------+-----+--------+---------+--------+  POP Prox   60                      triphasic           +----------+--------+-----+--------+---------+--------+  POP Distal 86                      triphasic           +----------+--------+-----+--------+---------+--------+  ATA Distal 110                     triphasic           +----------+--------+-----+--------+---------+--------+  PTA Prox   34                      triphasic           +----------+--------+-----+--------+---------+--------+  PTA Mid    29                       triphasic           +----------+--------+-----+--------+---------+--------+  PTA Distal 125                     triphasic           +----------+--------+-----+--------+---------+--------+  Summary: Left: Normal waveforms and adequate flow noted.  See table(s) above for measurements and observations. Electronically signed by Lemar Livings MD on 10/26/2019 at 4:50:14 PM.    Final    VAS [place] LOWER EXTREMITY ARTERIAL DUPLEX  Result Date: 10/19/2019 LOWER EXTREMITY ARTERIAL DUPLEX STUDY Indications: Cold leg, Covid-19.  Current ABI: N/A Comparison Study: No prior study Performing Technologist: Sherren Kerns RVS  Examination Guidelines: A complete evaluation includes B-mode imaging, spectral Doppler, color Doppler, and power Doppler as needed of all accessible portions of each vessel. Bilateral testing is considered an integral part of a complete examination. Limited examinations for reoccurring indications may be performed as noted.  +-----------+--------+-----+--------+---------+--------+  RIGHT       PSV cm/s Ratio Stenosis Waveform  Comments  +-----------+--------+-----+--------+---------+--------+  CFA Prox    67                      triphasic           +-----------+--------+-----+--------+---------+--------+  DFA  60                      triphasic           +-----------+--------+-----+--------+---------+--------+  SFA Prox    68                      triphasic           +-----------+--------+-----+--------+---------+--------+  SFA Mid     62                      triphasic           +-----------+--------+-----+--------+---------+--------+  SFA Distal  69                      triphasic           +-----------+--------+-----+--------+---------+--------+  POP Prox    47                      triphasic           +-----------+--------+-----+--------+---------+--------+  POP Distal  42                      triphasic           +-----------+--------+-----+--------+---------+--------+  ATA Distal  15                       triphasic           +-----------+--------+-----+--------+---------+--------+  PTA Prox    31                      triphasic           +-----------+--------+-----+--------+---------+--------+  PTA Mid     38                      triphasic           +-----------+--------+-----+--------+---------+--------+  PTA Distal  38                      triphasic           +-----------+--------+-----+--------+---------+--------+  PERO Distal 36                      triphasic           +-----------+--------+-----+--------+---------+--------+  Summary: Right: Normal examination. No evidence of arterial occlusive disease. Normal waveforms, no stenosis.  See table(s) above for measurements and observations. Electronically signed by Sherald Hess MD on 10/19/2019 at 3:25:02 PM.    Final    VAS [place] LOWER EXTREMITY VENOUS (DVT)  Result Date: 10/26/2019  Lower Venous DVTStudy Indications: Covid-19, elevated D-Dimer.  Comparison Study: Prior negative left lower extremity venous duplex don 03/19/17                   and is available for comparison. Performing Technologist: Sherren Kerns RVS  Examination Guidelines: A complete evaluation includes B-mode imaging, spectral Doppler, color Doppler, and power Doppler as needed of all accessible portions of each vessel. Bilateral testing is considered an integral part of a complete examination. Limited examinations for reoccurring indications may be performed as noted. The reflux portion of the exam is performed with the patient in reverse Trendelenburg.  +---------+---------------+---------+-----------+----------+--------------+  RIGHT     Compressibility Phasicity Spontaneity Properties Thrombus Aging  +---------+---------------+---------+-----------+----------+--------------+  CFV       Full            Yes       Yes                                    +---------+---------------+---------+-----------+----------+--------------+  SFJ       Full                                                              +---------+---------------+---------+-----------+----------+--------------+  FV Prox   Full                                                             +---------+---------------+---------+-----------+----------+--------------+  FV Mid    Full                                                             +---------+---------------+---------+-----------+----------+--------------+  FV Distal Full                                                             +---------+---------------+---------+-----------+----------+--------------+  PFV       Full                                                             +---------+---------------+---------+-----------+----------+--------------+  POP       Partial         Yes       Yes                    Acute           +---------+---------------+---------+-----------+----------+--------------+  PTV       Full                                                             +---------+---------------+---------+-----------+----------+--------------+  PERO      Full                                                             +---------+---------------+---------+-----------+----------+--------------+   +---------+---------------+---------+-----------+----------+--------------+  LEFT      Compressibility Phasicity Spontaneity Properties Thrombus Aging  +---------+---------------+---------+-----------+----------+--------------+  CFV       Full            Yes       Yes                                    +---------+---------------+---------+-----------+----------+--------------+  SFJ       Full                                                             +---------+---------------+---------+-----------+----------+--------------+  FV Prox   Full                                                             +---------+---------------+---------+-----------+----------+--------------+  FV Mid    Full                                                              +---------+---------------+---------+-----------+----------+--------------+  FV Distal Full                                                             +---------+---------------+---------+-----------+----------+--------------+  PFV       Full                                                             +---------+---------------+---------+-----------+----------+--------------+  POP       Full            Yes       Yes                                    +---------+---------------+---------+-----------+----------+--------------+  PTV       Full                                                             +---------+---------------+---------+-----------+----------+--------------+  PERO      Full                                                             +---------+---------------+---------+-----------+----------+--------------+  Summary: RIGHT: - Findings consistent with acute deep vein thrombosis involving the right popliteal vein. - Ultrasound characteristics of enlarged lymph nodes are noted in the groin.  LEFT: - There is no evidence of deep vein thrombosis in the lower extremity.  *See table(s) above for measurements and observations. Electronically signed by Lemar Livings MD on 10/26/2019 at 4:50:27 PM.    Final    [place] EKG SITE RITE  Result Date: 10/12/2019 If Site Rite image not attached, placement could not be confirmed due to current cardiac rhythm.

## 2019-11-10 NOTE — Progress Notes (Signed)
  Speech Language Pathology Treatment: Cognitive-Linquistic  Patient Details Name: Micheal Bell MRN: [id number] DOB: 08-20-1970 Today's Date: 11/10/2019 Time: 5176-1607 SLP Time Calculation (min) (ACUTE ONLY): 20 min  Assessment / Plan / Recommendation Clinical Impression  Patient seen to address cognitive-linguistic goals with his mother present in room. Mother reported that patient appears to be at baseline as far as his cognition and speech-language. Patient reported to SLP that he is awaiting placement into likely [facility] inpatient rehab but they do not have any beds. Patient was attentive, demonstrated adequate recall of recent therapy and medical interventions. SLP educated patient and his mother plan to discharge at this time from acute care SLP services and that he may benefit from SLP at next venue of care if cognition changes or interferes with his overall progress.     HPI HPI: 49 yo male with hx of HTN, T2DM, gout, IBS, fibromyalgia, obesity who presented on 8/16 for COVID pneumonia; decompensated on 8/19-8/28 (self extubated 8/23 and reintubated same day). Hospitalization was complicated by MSSA and GBS bacteria and found to have systolic heart failure. CXR 9/1 Hypoinflation of the lungs with mild bibasilar subsegmental atelectasis.      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

## 2019-11-10 NOTE — Care Management (Addendum)
Benefit check   Patient's insurance not in network with [facility] for IPR , however is in network with [facility].  Patient's insurance in network with : [employer]           [phone]  [facility] [facility]                  [phone]  [facility]       [phone]       [phone]  [facility] [phone]  [facility] Sharper       [phone]  managed medicaid plan ([employer])        Spoke to patient at bedside , explained above. Patient is interested in [facility]. NCM called left message with admissions Pam Byers [phone], fax [phone]. Referral faxed.   Also, patient interested in [facility] [place]. NCM called Jace with admissions . Jace requested face sheet to be faxed fax [phone], same done.  Await call backs.  Update 1428 Received a call from Velna Hatchet at [facility], she has reviewed the referral and does not have a bed to offer patient at this time.    Called Jace with [facility] left message.   Jace returned call. They do accept patient's insurance, he will review referral , if accepted , would need insurance authorization. Patient aware. Ronny Flurry RN

## 2019-11-11 LAB — GLUCOSE, CAPILLARY
Glucose-Capillary: 212 mg/dL — ABNORMAL HIGH (ref 70–99)
Glucose-Capillary: 170 mg/dL — ABNORMAL HIGH (ref 70–99)
Glucose-Capillary: 155 mg/dL — ABNORMAL HIGH (ref 70–99)
Glucose-Capillary: 148 mg/dL — ABNORMAL HIGH (ref 70–99)

## 2019-11-11 LAB — BASIC METABOLIC PANEL
Anion gap: 12 (ref 5–15)
BUN: <5 mg/dL — ABNORMAL LOW (ref 6–20)
CO2: 26 mmol/L (ref 22–32)
Calcium: 9 mg/dL (ref 8.9–10.3)
Chloride: 98 mmol/L (ref 98–111)
Creatinine, Ser: 0.73 mg/dL (ref 0.61–1.24)
GFR calc Af Amer: >60 mL/min (ref >60)
GFR calc non Af Amer: >60 mL/min (ref >60)
Glucose, Bld: 138 mg/dL — ABNORMAL HIGH (ref 70–99)
Potassium: 3.8 mmol/L (ref 3.5–5.1)
Sodium: 136 mmol/L (ref 135–145)

## 2019-11-11 LAB — MAGNESIUM: Magnesium: 1.7 mg/dL (ref 1.7–2.4)

## 2019-11-11 MED ORDER — POTASSIUM CHLORIDE CRYS ER 20 MEQ PO TBCR
20.0000 meq | EXTENDED_RELEASE_TABLET | Freq: Once | ORAL | Status: AC
  Administered 2019-11-11: 20 meq via ORAL
  Filled 2019-11-11: qty 1

## 2019-11-11 MED ORDER — FUROSEMIDE 20 MG PO TABS: 20.0000 mg | ORAL_TABLET | Freq: Every day | ORAL | Status: DC

## 2019-11-11 NOTE — Progress Notes (Signed)
PROGRESS NOTE                                                                                                                                                                                                             Patient Demographics:    Micheal Bell, is a 49 y.o. male, DOB - 15-May-1970, ZOX:[id number]  Outpatient Primary MD for the patient is [name], MD    LOS - 30  Admit date - 10/09/2019    Chief Complaint  Patient presents with  . Cough       Brief Narrative - 49 yo male with hx of HTN, T2DM, gout, IBS, fibromyalgia, obesity who presented on 8/16 for COVID pneumonia.  He decompensated on 8/19 and was intubated.  Self extubated on 8/23 and was reintubated.  He's subsequently been extubated on 8/28.  He's been on typical therapies for covid including steroids, baricitinib and remdesivir.  His hospitalization was complicated by MSSA and GBS bacteria.  Also found to have systolic heart failure.    Subjective:   Patient in bed, appears comfortable, denies any headache, no fever, no chest pain or pressure, no shortness of breath , no abdominal pain. No focal weakness.   Assessment  & Plan :   Acute Hypoxic Resp. Failure due to Acute Covid 19 Viral Pneumonitis during the ongoing 2020 Covid 19 Pandemic - he had severe hypoxia and parenchymal lung injury he was intubated and admitted by ICU, he was started on IV steroids, remdesivir and Baricitinib on 10/12/2019.  He was subsequently extubated on 10/21/2019.  His stay was complicated by bacteremia after which Baricitinib was discontinued on 10/24/2019.  His pulmonary status is gradually improving, currently he is stable and symptom-free, he is on room air, I have discussed with him about incentive spirometry, flutter valve, pulmonary toiletry, he is now off of isolation and his pulmonary issues from COVID-19 is clinically resolved.   Recent Labs  Lab 11/05/19 0356  11/05/19 0357 11/06/19 0339 11/07/19 0350 11/08/19 0450 11/08/19 1144 11/09/19 0430  WBC 9.6  --  9.7 8.1 7.7  --   --   PLT 353  --  354 305 297  --   --   CRP 9.8*  --  11.4* 9.8* 10.4*  --   --   BNP  --  36.0  --   --   --  36.1 28.8  DDIMER 5.95*  --   --   --   --   --   --   PROCALCITON <0.10  --   --   --   --   --   --   AST 25  --  21 17 20   --   --   ALT 17  --  18 17 18   --   --   ALKPHOS 84  --  89 80 83  --   --   BILITOT 0.7  --  0.7 0.8 0.9  --   --   ALBUMIN 2.2*  --  2.3* 2.1* 2.2*  --   --     Sepsis with MSSA and Group B strep bacteremia due to left foot abscess.  ID following, was on IV Ancef but switch to nafcillin on 10/26/2019.  Clean TEE, history of Charcot joints, peripheral neuropathy and recent left foot soft tissue injury, MRI left foot shows an abscess, Ortho was consulted and they recommended medical management with outpatient follow-up with Dr. [name] post discharge, also has osteomyelitis, defer duration of treatment with antibiotics to ID.  Antibiotics management per ID, plan to continue total of 4 weeks of IV nafcillin, through 9/29, then start Keflex 500 mg 4 times daily, he has a follow-up arranged with Dr. [name] on October 13 at 10:30 AM .   Mild dry cough on 11/08/2019.  X-ray with mild fluid overload, likely acute on chronic diastolic CHF, improved trial of Lasix and monitor.   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  For seizures he has been started on Keppra with good effect and his mentation has improved considerably. Continue PT OT and speech, SNF VS CIR .  Neuro has seen the patient this admission.   Possible  systolic heart failure as shown by TTE however this could have been transient due to sepsis, on repeat TEE his EF is now normalized to 50 to 55%.  He is currently compensated, blood pressure was too low and after hydration has improved, continue beta-blocker.   Cyanotic right lower extremity toes.  Likely due to hypoperfusion which is transient, arterial ultrasound stable. Resolved.   History of chronic pain.  On multiple narcotics, Flexeril and benzodiazepines.  Was off of these medications for several days without any discomfort, reintroduced on his request at very low doses.  Do not escalate.  HTN - BP was low on multiple blood pressure medications, was hydrated now able to tolerate low-dose beta-blocker.  Acute DVT in the right lower extremity.  Switched to full dose Lovenox >> to Eliquis on 11/11/2019.   Hypomagnesemia.  Persistently low despite aggressive treatment, discussed with nephrologist Dr. Melanee Spry, will obtain 24-hour urinary magnesium and continue to replace as needed.  Magnesium is improved after discontinuation of PPI.  DM type II.  Currently on Lantus and sliding scale monitor.  Lab Results  Component Value Date   HGBA1C 11.3 (H) 10/26/2019   CBG (last 3)  Recent Labs    11/10/19 1615 11/10/19 1939 11/11/19 0818  GLUCAP 145* 179* 212*      Condition -   Guarded  Family Communication  :  Sister and mother [id number] - 10/25/19, 10/27/19, 10/28/19, 11/03/19, 11/11/19  Code Status :  Full  Consults  :  PCCM, ID, Cards  Procedures  :    EEG - This study showed evidence of potential epileptogenicity arising from bilateral frontocentral, maximal vertex region. The sharp waves are at times rhythmic consistent with brief ictal-interictal rhythmic discharges. Additionally, there is evidence of mild diffuse encephalopathy, nonspecific etiology.  Bilateral lower extremity venous ultrasound - Acute right lower extremity popliteal vein DVT.  Carotid duplex.  Nonacute.     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  CT Head -  1. No acute intracranial abnormality. 2. Area of subcortical low-density involving the posterior left frontal lobe at the convexity, likely encephalomalacia and sequela of remote infarct. There are no prior exams for comparison to establish chronicity. MRI could be considered for further evaluation based on clinical concern. 3. Paranasal sinus fluid levels on opacification of the right greater than left mastoid air cells, possibly related to intubation.  R. Leg Arterial US -  No evidence of arterial occlusive disease. Normal waveforms, no stenosis  TTE -  1. Extremely poor acoustic windows. LVEF appears to be depressed with diffuse hypokinesis, worse in the mid/distal inferior/inferoseptal walls. . Left ventricular ejection fraction, by estimation, is 30 to 35%. The left ventricle has moderately decreased function. The left ventricular internal cavity size was mildly dilated. Left ventricular diastolic parameters are indeterminate.  2. Right ventricular systolic function is moderately reduced. The right ventricular size is mildly enlarged.  3. The mitral valve is normal in structure. Trivial mitral valve regurgitation.  4. The aortic valve is normal in structure. Aortic valve regurgitation is not visualized  TEE - 1. Left ventricular ejection fraction, by estimation, is 50 to 55%. The left ventricle has low normal function. The left ventricle has no regional wall motion abnormalities.  2. Right ventricular systolic function is normal. The right ventricular size is normal.  3. No left atrial/left atrial appendage thrombus was detected.  4. The mitral valve is normal in structure. Trivial mitral valve regurgitation. No evidence of mitral stenosis.  5. The aortic valve is normal in structure. Aortic valve regurgitation is not visualized. No aortic stenosis is  present.  6. The inferior vena cava is normal in size with greater than 50% respiratory variability, suggesting right atrial pressure of 3 mmHg.  CT - 1. No acute intracranial abnormality. 2. Area of subcortical low-density involving the posterior left frontal lobe at the convexity, likely encephalomalacia and sequela of remote infarct. There are no prior exams for comparison to establish chronicity. MRI could be considered for further evaluation based on clinical concern. 3. Paranasal sinus fluid levels on opacification of the right greater than left mastoid air cells, possibly related to intubation    PUD Prophylaxis : PPI  Disposition Plan  :    Status is: Inpatient  Remains inpatient appropriate because:IV treatments appropriate due to intensity of illness or inability to take PO   Dispo: The patient is from: Home              Anticipated d/c is to: Home              Anticipated d/c date is: 1 day              Patient currently is medically stable to d/c. to CIR when bed is available.  DVT Prophylaxis  :  Lovenox    Lab Results  Component Value Date   PLT 297 11/08/2019    Diet :  Diet Order            Diet heart healthy/carb modified Room service appropriate? No; Fluid consistency: Thin  Diet effective now                  Inpatient Medications  Scheduled Meds: . apixaban  5 mg Oral BID  . atorvastatin  40 mg Oral Daily  . buPROPion  150 mg Oral Daily  . Chlorhexidine Gluconate Cloth  6 each Topical Daily  . docusate sodium  100 mg Oral BID  . famotidine  10 mg Oral Daily  . [START ON 11/12/2019] furosemide  20 mg Oral Daily  . insulin aspart  0-15 Units Subcutaneous TID WC  . insulin glargine  20 Units Subcutaneous Daily  . levETIRAcetam  500 mg Oral BID  . magnesium oxide  800 mg Oral BID  . melatonin  3 mg Oral QHS  . metoprolol tartrate  50 mg Oral BID  . multivitamin with minerals  1 tablet Oral Daily  . polyethylene glycol  17 g Oral Daily  . [START  ON 11/12/2019] potassium chloride  20 mEq Oral Once  . Ensure Max Protein  11 oz Oral QHS  . senna  2 tablet Oral BID  . sodium chloride flush  10-40 mL Intracatheter Q12H   Continuous Infusions: . sodium chloride 10 mL/hr at 11/02/19 0600  . nafcillin (NAFCIL) continuous infusion 12 g (11/10/19 1044)   PRN Meds:.sodium chloride, acetaminophen, bisacodyl, cyclobenzaprine, HYDROcodone-acetaminophen, Ipratropium-Albuterol, menthol-cetylpyridinium, zolpidem  Antibiotics  :    Anti-infectives (From admission, onward)   Start     Dose/Rate Route Frequency Ordered Stop   10/31/19 1415  nafcillin 12 g in sodium chloride 0.9 % 500 mL continuous infusion  Status:  Discontinued        12 g 20.8 mL/hr over 24 Hours Intravenous Every 24 hours 10/31/19 0608 10/31/19 0609   10/29/19 1415  nafcillin 12 g in dextrose 5 % 500 mL IVPB  Status:  Discontinued        12 g 20.8 mL/hr over 24 Hours Intravenous Every 24 hours 10/29/19 0434 10/29/19 0507   10/29/19 1415  nafcillin 12 g in sodium chloride 0.9 % 500 mL continuous infusion        12 g 20.8 mL/hr over 24 Hours Intravenous Every 24 hours 10/29/19 0507 11/22/19 2359   10/26/19 1415  nafcillin 12 g in dextrose 5 % 500 mL IVPB  Status:  Discontinued        12 g 20.8 mL/hr over 24 Hours Intravenous Every 24 hours 10/26/19 1414 10/29/19 0434   10/26/19 1400  nafcillin injection 12 g  Status:  Discontinued        12 g Intravenous Daily 10/26/19 1238 10/26/19 1242   10/26/19 1400  nafcillin 12 g in dextrose 5 % 50 mL IVPB  Status:  Discontinued        12 g 100 mL/hr over 30 Minutes Intravenous Every 24 hours 10/26/19 1242 10/26/19 1339   10/26/19 1400  nafcillin 12 g in dextrose 5 % 500 mL IVPB  Status:  Discontinued        12 g 20.8 mL/hr over 24 Hours Intravenous Every 24 hours 10/26/19 1337 10/26/19 1416   10/26/19 1200  nafcillin 2 g in sodium chloride 0.9 % 100 mL IVPB  Status:  Discontinued        2 g 200 mL/hr over 30 Minutes Intravenous Every  4 hours 10/26/19 1010 10/26/19 1238   10/12/19 2200  cefTRIAXone (ROCEPHIN) 2 g in sodium chloride 0.9 % 100 mL IVPB  Status:  Discontinued        2 g 200 mL/hr over 30 Minutes Intravenous Every 24 hours 10/12/19 0441 10/12/19 0921   10/12/19 0930  ceFAZolin (ANCEF) IVPB 2g/100 mL premix  Status:  Discontinued        2 g 200 mL/hr over 30 Minutes Intravenous Every 8 hours 10/12/19 0921 10/26/19 1010   10/11/19 1000  remdesivir 100 mg in sodium chloride 0.9 % 100 mL IVPB       "Followed by" Linked Group Details   100 mg 200 mL/hr over 30 Minutes Intravenous Daily 10/10/19 0050 10/14/19 0956   10/11/19 0800  vancomycin (VANCOCIN) IVPB 1000 mg/200 mL premix  Status:  Discontinued        1,000 mg 200 mL/hr over 60 Minutes Intravenous Every 8 hours 10/10/19 2239 10/12/19 0921   10/10/19 2230  vancomycin (VANCOCIN) IVPB 1000 mg/200 mL premix        1,000 mg 200 mL/hr over 60 Minutes Intravenous Every 1 hr x 2 10/10/19 2208 10/11/19 0052   10/10/19 2215  cefTRIAXone (ROCEPHIN) 2 g in sodium chloride 0.9 % 100 mL IVPB  Status:  Discontinued        2 g 200 mL/hr over 30 Minutes Intravenous Every 24 hours 10/10/19 2205 10/12/19 0441   10/10/19 0100  remdesivir 100 mg in sodium chloride 0.9 % 100 mL IVPB       "Followed by" Linked Group Details   100 mg 200 mL/hr over 30 Minutes Intravenous Every 30 min 10/10/19 0050 10/10/19 0332        Susa Raring M.D on 11/11/2019 at 8:35 AM  To page go to [email or web address] -  [facility] -  Office  [phone]   See all Orders from today for further details    Objective:   Vitals:   11/10/19 0422 11/10/19 1419 11/10/19 1939 11/11/19 0356  BP: 119/75 124/81 117/78 (!) 107/59  Pulse: 92 90 (!) 109 79  Resp: 17 18  20   Temp: 98.3 F (36.8 C) 98.3 F (36.8 C) 98.7 F (37.1 C) 98.2 F (36.8 C)  TempSrc: Oral Axillary Oral Oral  SpO2: 90% 99% 97% 97%  Weight: 112.8 kg     Height:        Wt Readings from Last 3 Encounters:  11/10/19  112.8 kg  09/11/19 111.1 kg  05/12/17 (!) 145.2 kg     Intake/Output Summary (Last 24 hours) at 11/11/2019 0835 Last data filed at 11/11/2019 0736 Gross per 24 hour  Intake 1154 ml  Output 300 ml  Net 854 ml     Physical Exam  Awake Alert, No new F.N deficits, chronic bilateral rotator cuff injury induced bilateral upper extremity weakness in the deltoid area, per patient this is old and unchanged,  chronic foot wounds on both feet under bandage, right arm PICC line in place. [place].AT,PERRAL Supple Neck,No JVD, No cervical lymphadenopathy appriciated.  Symmetrical Chest wall movement, Good air movement bilaterally, CTAB RRR,No Gallops, Rubs or new Murmurs, No Parasternal Heave +ve B.Sounds, Abd Soft, No tenderness, No organomegaly appriciated, No rebound - guarding or rigidity. No Cyanosis, Clubbing or edema, No new Rash or bruise    Data Review:    CBC Recent Labs  Lab 11/05/19 0356  11/06/19 0339 11/07/19 0350 11/08/19 0450  WBC 9.6 9.7 8.1 7.7  HGB 10.1* 10.2* 9.4* 9.3*  HCT 30.9* 32.3* 29.0* 29.2*  PLT 353 354 305 297  MCV 92.8 94.4 92.9 92.1  MCH 30.3 29.8 30.1 29.3  MCHC 32.7 31.6 32.4 31.8  RDW 13.2 13.4 13.6 13.5  LYMPHSABS 3.0 3.1 2.5 2.4  MONOABS 0.8 0.7 0.8 0.7  EOSABS 0.5 0.5 0.4 0.3  BASOSABS 0.0 0.0 0.0 0.0    Recent Labs  Lab 11/05/19 0356 11/05/19 0356 11/05/19 0357 11/06/19 0339 11/06/19 0339 11/07/19 0350 11/08/19 0450 11/08/19 1144 11/09/19 0430 11/10/19 0437 11/11/19 0306  NA 135   < >  --  134*   < > 136 134*  --  133* 135 136  K 4.0   < >  --  4.0   < > 3.7 3.8  --  3.7 3.7 3.8  CL 100   < >  --  98   < > 101 99  --  97* 98 98  CO2 25   < >  --  26   < > 25 25  --  26 27 26   GLUCOSE 155*   < >  --  163*   < > 141* 136*  --  139* 147* 138*  BUN 5*   < >  --  <5*   < > <5* 5*  --  6 5* <5*  CREATININE 0.76   < >  --  0.78   < > 0.75 0.70  --  0.71 0.72 0.73  CALCIUM 8.9   < >  --  8.9   < > 8.6* 8.7*  --  8.7* 8.7* 9.0  AST 25  --    --  21  --  17 20  --   --   --   --   ALT 17  --   --  18  --  17 18  --   --   --   --   ALKPHOS 84  --   --  89  --  80 83  --   --   --   --   BILITOT 0.7  --   --  0.7  --  0.8 0.9  --   --   --   --   ALBUMIN 2.2*  --   --  2.3*  --  2.1* 2.2*  --   --   --   --   MG 1.6*   < >  --  1.8   < > 1.5* 1.7  --  1.6* 2.0 1.7  CRP 9.8*  --   --  11.4*  --  9.8* 10.4*  --   --   --   --   DDIMER 5.95*  --   --   --   --   --   --   --   --   --   --   PROCALCITON <0.10  --   --   --   --   --   --   --   --   --   --   BNP  --   --  36.0  --   --   --   --  36.1 28.8  --   --    < > = values in this interval not displayed.    ------------------------------------------------------------------------------------------------------------------ No results for input(s): CHOL, HDL, LDLCALC, TRIG, CHOLHDL, LDLDIRECT in the last  72 hours.  Lab Results  Component Value Date   HGBA1C 11.3 (H) 10/26/2019   ------------------------------------------------------------------------------------------------------------------ No results for input(s): TSH, T4TOTAL, T3FREE, THYROIDAB in the last 72 hours.  Invalid input(s): FREET3  Cardiac Enzymes No results for input(s): CKMB, TROPONINI, MYOGLOBIN in the last 168 hours.  Invalid input(s): CK ------------------------------------------------------------------------------------------------------------------    Component Value Date/Time   BNP 28.8 11/09/2019 0430    Micro Results No results found for this or any previous visit (from the past 240 hour(s)).  Radiology Reports CT HEAD WO CONTRAST  Result Date: 10/19/2019 CLINICAL DATA:  Deliriums.  Mental status change. EXAM: CT HEAD WITHOUT CONTRAST TECHNIQUE: Contiguous axial images were obtained from the base of the skull through the vertex without intravenous contrast. COMPARISON:  None. FINDINGS: Brain: Area subcortical low-density involving the posterior left frontal lobe at the convexity, series 3,  image 25 and series 5, image 49, likely encephalomalacia and sequela of remote infarct, however nonspecific. No hemorrhage. No evidence of acute ischemia. No hydrocephalus, midline shift or mass effect. No subdural or extra-axial collection. Vascular: Atherosclerosis of skullbase vasculature without hyperdense vessel or abnormal calcification. Skull: No fracture or focal lesion. Sinuses/Orbits: Nasogastric tube in place. Fluid levels in the sphenoid sinuses and right maxillary sinus with scattered opacification of ethmoid air cells may be related to intubation. Opacification of the right greater than left mastoid air cells. Other: None. IMPRESSION: 1. No acute intracranial abnormality. 2. Area of subcortical low-density involving the posterior left frontal lobe at the convexity, likely encephalomalacia and sequela of remote infarct. There are no prior exams for comparison to establish chronicity. MRI could be considered for further evaluation based on clinical concern. 3. Paranasal sinus fluid levels on opacification of the right greater than left mastoid air cells, possibly related to intubation. Electronically Signed   By: Narda Rutherford M.D.   On: 10/19/2019 15:30   CT ANGIO CHEST PE W OR WO CONTRAST  Result Date: 10/23/2019 CLINICAL DATA:  COVID pneumonia, acute hypoxic respiratory failure, positive D-dimer EXAM: CT ANGIOGRAPHY CHEST WITH CONTRAST TECHNIQUE: Multidetector CT imaging of the chest was performed using the standard protocol during bolus administration of intravenous contrast. Multiplanar CT image reconstructions and MIPs were obtained to evaluate the vascular anatomy. CONTRAST:  75mL OMNIPAQUE IOHEXOL 350 MG/ML SOLN COMPARISON:  None. FINDINGS: Cardiovascular: Satisfactory opacification of the pulmonary arteries to the segmental level. No evidence of pulmonary embolism. The central pulmonary arteries are of normal caliber. There is moderate coronary artery calcification. Normal heart size. No  pericardial effusion. The thoracic aorta is unremarkable save for bovine arch anatomy. Right upper extremity PICC line tip is seen within the superior vena cava. Mediastinum/Nodes: No enlarged mediastinal, hilar, or axillary lymph nodes. Thyroid gland, trachea, and esophagus demonstrate no significant findings. Nasoenteric feeding tube extends into the upper abdomen beyond the margin of the examination. Lungs/Pleura: Lung volumes are small. Evaluation is limited by motion artifact, however, multifocal pulmonary infiltrates are identified demonstrating a peripheral and basal predominance, likely infectious or inflammatory in the acute setting. No central obstructing lesion. No pneumothorax or pleural effusion. No superimposed interstitial edema. Upper Abdomen: No acute abnormality. Musculoskeletal: No chest wall abnormality. No acute or significant osseous findings. Review of the MIP images confirms the above findings. IMPRESSION: 1. No evidence of pulmonary embolism. 2. Multifocal pulmonary infiltrates are identified demonstrating a peripheral and basal predominance, likely infectious or inflammatory in the acute setting. 3. Moderate coronary artery calcification. Electronically Signed   By: Helyn Numbers MD  On: 10/23/2019 16:25   MR ANGIO HEAD WO CONTRAST  Result Date: 10/29/2019 CLINICAL DATA:  Neuro deficit with stroke suspected EXAM: MRA HEAD WITHOUT CONTRAST TECHNIQUE: Angiographic images of the Circle of Willis were obtained using MRA technique without intravenous contrast. COMPARISON:  Four days ago FINDINGS: History of bacteremia and embolic infarction. No vessel beading, stenosis, or pseudoaneurysm. IMPRESSION: Negative intracranial MRA. Electronically Signed   By: Marnee Spring M.D.   On: 10/29/2019 12:04   MR BRAIN WO CONTRAST  Result Date: 10/25/2019 CLINICAL DATA:  Neuro deficit, acute, stroke suspected. Additional history provided: COVID positive. Additional history obtained from electronic  MEDICAL RECORD NUMBERSepsis with MSSA and group B strep bacteremia. EXAM: MRI HEAD WITHOUT CONTRAST TECHNIQUE: Multiplanar, multiecho pulse sequences of the brain and surrounding structures were obtained without intravenous contrast. COMPARISON:  Head CT 10/19/2019. FINDINGS: Brain: The examination is limited by poor signal on several sequences as well as motion degradation. Most notably, there is moderate motion degradation of the axial T2 weighted sequence, severe motion degradation of the axial SWI sequence and moderate motion degradation of the coronal T2 weighted sequence. Cerebral volume is normal for age. There is a 17 mm focus of restricted diffusion and corresponding T2/FLAIR hyperintensity within the subcortical left frontoparietal lobes (for instance as seen on series 2, image 39) (series 3, image 17). Additional mild scattered T2/FLAIR hyperintensity within the cerebral white matter and pons is nonspecific, but consistent with chronic small vessel ischemic disease. Severe motion degradation of the axial SWI sequence precludes adequate evaluation for intracranial blood products. No extra-axial fluid collection. No midline shift. Vascular: Expected proximal arterial flow voids. Skull and upper cervical spine: No focal marrow lesion is identified within described limitations. Sinuses/Orbits: Visualized orbits show no acute finding. Air-fluid levels within the sphenoid and right maxillary sinuses. Partial opacification of right ethmoid air cells. Bilateral mastoid effusions. These results will be called to the ordering clinician or representative by the Radiologist Assistant, and communication documented in the PACS or [employer]. IMPRESSION: Examination limited by poor signal on several sequences as well as motion degradation, as described. 17 mm focus of restricted diffusion and T2/FLAIR hyperintensity within the subcortical left frontoparietal lobes. This finding is nonspecific, but given the patient's  history, the primary differential considerations are acute/early subacute infarct versus cerebritis. Consider contrast-enhanced MR imaging of the brain for further evaluation of this lesion. Mild chronic small vessel ischemic disease. Paranasal sinus disease with air-fluid levels. Bilateral mastoid effusions. Electronically Signed   By: Jackey Loge DO   On: 10/25/2019 15:16   MR BRAIN W CONTRAST  Result Date: 10/25/2019 CLINICAL DATA:  Brain mass follow-up EXAM: MRI HEAD WITH CONTRAST TECHNIQUE: Multiplanar, multiecho pulse sequences of the brain and surrounding structures were obtained with intravenous contrast. CONTRAST:  10mL GADAVIST GADOBUTROL 1 MMOL/ML IV SOLN COMPARISON:  Brain MRI without contrast 10/25/2019 FINDINGS: Brain: There is a peripherally enhancing lesion at the base of the left precentral gyrus that measures 0.9 x 0.7 x 1.8 cm. There are no other areas of abnormal contrast enhancement. 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 Electronically Signed   By: Deatra Robinson M.D.   On: 10/25/2019 22:47   MR FOOT LEFT WO CONTRAST  Result Date: 10/25/2019 CLINICAL DATA:  Bacteremia left foot pain and diabetic EXAM: MRI OF THE LEFT FOOT WITHOUT CONTRAST TECHNIQUE: Multiplanar, multisequence MR imaging of the left was performed. No intravenous contrast was administered.  COMPARISON:  October 23, 2019 FINDINGS: Bones/Joint/Cartilage Mildly angulated nondisplaced fractures of the midshaft of the fourth and fifth metatarsals are noted. There is periosteal reaction seen along the medial margins of the metatarsal shafts. Increased heterogeneous T2 hyperintense signal with subtle T1 hypointensity seen at the base of the fourth and fifth metatarsals. No other osseous marrow signal abnormality is seen. There is no large knee joint effusions noted. The articular surfaces appear to be maintained. Ligaments The  Lisfranc ligament is intact. Muscles and Tendons Increased feathery signal with atrophy is seen throughout the muscles of the forefoot. There is a small amount of fluid seen surrounding the posterior tibialis tendon. The remainder of the flexor and extensor tendons are intact. Soft tissues Lateral plantar surface of the forefoot overlying the fifth metatarsal base there is a focal area of ulceration measuring approximately 8 mm in transverse dimension. A fluid-filled sinus tract is seen extending to the overlying osseous surface. There is a multilocular fluid collection which extends around the dorsal surface of the fifth metatarsal measuring approximately 5.4 x 0.9 x 3.4 cm. There is extensive dorsal and lateral subcutaneous edema and skin thickening. IMPRESSION: Superficial area of ulceration over the plantar lateral aspect of the fifth metatarsal base with a fluid-filled sinus tract and loculated probable abscess extending over the dorsal surface of the fifth metatarsal measuring 5.4 x 0.9 by is a 3.4 cm. Incomplete mildly angulated fourth and fifth metatarsal shaft fractures with periosteal reaction Findings which could be suggestive of reactive marrow versus early osteomyelitis involving the base of the fourth and fifth metatarsals. Electronically Signed   By: Jonna Clark M.D.   On: 10/25/2019 19:06   DG Chest Port 1 View  Result Date: 11/09/2019 CLINICAL DATA:  Shortness of breath and cough EXAM: PORTABLE CHEST 1 VIEW COMPARISON:  October 26, 2019 FINDINGS: Central catheter tip is in the superior vena cava. No pneumothorax. There is no edema or airspace opacity. Heart size and pulmonary vascularity are normal. No adenopathy. No bone lesions. IMPRESSION: Central catheter tip in superior vena cava. No pneumothorax. Lungs clear. Cardiac silhouette normal. Electronically Signed   By: Bretta Bang III M.D.   On: 11/09/2019 13:51   DG Chest Port 1 View  Result Date: 10/26/2019 CLINICAL DATA:  Short of  breath.  COVID positive EXAM: PORTABLE CHEST 1 VIEW COMPARISON:  10/25/2019 FINDINGS: Hypoventilation with decreased lung volume. Mild bibasilar airspace disease unchanged. No new infiltrate or effusion. Right arm PICC tip in the SVC unchanged. IMPRESSION: Hypoventilation with bibasilar mild airspace disease unchanged. Electronically Signed   By: Marlan Palau M.D.   On: 10/26/2019 16:10   DG Chest Port 1 View  Result Date: 10/25/2019 CLINICAL DATA:  Shortness of breath. EXAM: PORTABLE CHEST 1 VIEW COMPARISON:  October 16, 2019. FINDINGS: The heart size and mediastinal contours are within normal limits. Hypoinflation of the lungs is noted with mild bibasilar subsegmental atelectasis. Endotracheal tube has been removed. Right-sided PICC line is unchanged in position. The visualized skeletal structures are unremarkable. IMPRESSION: Hypoinflation of the lungs with mild bibasilar subsegmental atelectasis. Electronically Signed   By: Lupita Raider M.D.   On: 10/25/2019 08:37   DG CHEST PORT 1 VIEW  Result Date: 10/16/2019 CLINICAL DATA:  Status post intubation. EXAM: PORTABLE CHEST 1 VIEW COMPARISON:  10/14/2019 FINDINGS: ET tube tip is above the carina. There is a right arm PICC line with tip at the cavoatrial junction. Lung volumes are low. Similar appearance of bilateral pulmonary opacities compatible with  multifocal pneumonia. IMPRESSION: 1. Stable support apparatus. 2. Persistent bilateral pulmonary opacities compatible with multifocal pneumonia. Electronically Signed   By: Signa Kell M.D.   On: 10/16/2019 06:14   DG Chest Port 1 View  Result Date: 10/15/2019 CLINICAL DATA:  49 year old male with history of COVID infection. EXAM: PORTABLE CHEST 1 VIEW COMPARISON:  Chest x-ray 10/14/2019. FINDINGS: An endotracheal tube is in place with tip 3.6 cm above the carina. A nasogastric tube is seen extending into the stomach, however, the tip of the nasogastric tube extends below the lower margin of the  image. There is a right upper extremity PICC with tip terminating in the right atrium. Lung volumes are low. Widespread patchy areas of interstitial prominence an ill-defined airspace disease again noted throughout the lungs bilaterally. Overall, aeration is essentially unchanged. No pleural effusions. No evidence of pulmonary edema. No pneumothorax. Heart size is normal. Upper mediastinal contours are within normal limits. IMPRESSION: 1. Support apparatus, as above. 2. The appearance the chest is compatible with multilobar pneumonia from reported COVID infection. Electronically Signed   By: Trudie Reed M.D.   On: 10/15/2019 05:39   DG Chest Port 1 View  Result Date: 10/14/2019 CLINICAL DATA:  COVID-19, ARDS EXAM: PORTABLE CHEST 1 VIEW COMPARISON:  10/13/2019 FINDINGS: Single frontal view of the chest demonstrates stable position of the endotracheal tube and enteric catheter. There is a right-sided PICC tip overlying superior vena cava. The cardiac silhouette is stable. Interstitial and ground-glass opacities are seen throughout the lungs bilaterally, unchanged. Lung volumes are diminished. No effusion or pneumothorax. IMPRESSION: 1. 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 2. Stable support devices. Electronically Signed   By: Sharlet Salina M.D.   On: 10/14/2019 15:57   DG Chest Port 1 View  Result Date: 10/13/2019 CLINICAL DATA:  Acute respiratory failure with hypoxemia, COVID positive, asthma, diabetes mellitus, hypertension, CHF EXAM: PORTABLE CHEST 1 VIEW COMPARISON:  Portable exam 1021 hours compared to 10/12/2019 FINDINGS: Tip of endotracheal tube projects 3.7 cm above carina. Nasogastric tube extends into stomach. RIGHT arm PICC line tip projects over cavoatrial junction. Stable heart size mediastinal contours for technique. Patchy BILATERAL airspace infiltrates consistent with multifocal pneumonia,  minimally improved. No pleural effusion or pneumothorax. IMPRESSION: Minimally improved pulmonary infiltrates. Electronically Signed   By: Ulyses Southward M.D.   On: 10/13/2019 10:37   DG Abd Portable 1V  Result Date: 10/20/2019 CLINICAL DATA:  GI problem. EXAM: PORTABLE ABDOMEN - 1 VIEW COMPARISON:  10/16/2019 FINDINGS: Feeding tube tip is in the distal stomach or proximal duodenum. Mild gaseous distention of the stomach. Gas throughout nondistended large and small bowel. No evidence of bowel obstruction, organomegaly or free air. IMPRESSION: Feeding tube tip in the distal stomach or proximal duodenum. No evidence of obstruction or free air. Electronically Signed   By: Charlett Nose M.D.   On: 10/20/2019 21:28   DG Abd Portable 1V  Result Date: 10/16/2019 CLINICAL DATA:  Status post OG tube placement EXAM: PORTABLE ABDOMEN - 1 VIEW COMPARISON:  10/16/2019 FINDINGS: The enteric tube tip projects over the distal body of stomach in the side port is below the level of the GE junction. A few air-filled loops of small bowel are noted within the upper abdomen which measure up to 2.8 cm. IMPRESSION: Satisfactory position of enteric tube. Electronically Signed   By: Signa Kell M.D.   On: 10/16/2019 06:52   DG Abd Portable 1V  Result Date: 10/12/2019 CLINICAL DATA:  Onychogryphosis EXAM: PORTABLE ABDOMEN - 1 VIEW COMPARISON:  Portable exam 0830 hours without priors for comparison FINDINGS: Tip of nasogastric tube projects over distal antrum. Nonobstructive bowel gas pattern. No bowel dilatation or bowel wall thickening. Patchy bibasilar infiltrates. No acute osseous findings. IMPRESSION: Tip of nasogastric tube projects over distal gastric antrum. Nonobstructive bowel gas pattern. Bibasilar pulmonary infiltrates. Electronically Signed   By: Ulyses Southward M.D.   On: 10/12/2019 08:51   DG Foot 2 Views Left  Result Date: 10/23/2019 CLINICAL DATA:  Bacteremia, LEFT foot pain and diabetic. EXAM: LEFT FOOT - 2 VIEW  COMPARISON:  10/13/2019 and prior radiographs FINDINGS: Mildly angulated fractures of the 4th and 5th metatarsals are again noted with fracture lines still evident. Healing changes along these fractures are noted. Overlying soft tissue swelling is noted. No new fracture, subluxation or dislocation noted. No definite radiographic evidence of acute osteomyelitis noted. IMPRESSION: Mildly angulated healing fractures of the 4th and 5th metatarsals with soft tissue swelling again noted but without definite radiographic evidence of acute osteomyelitis. Consider MRI for further evaluation as clinically indicated. Electronically Signed   By: Harmon Pier M.D.   On: 10/23/2019 13:40   DG Foot 2 Views Left  Result Date: 10/13/2019 CLINICAL DATA:  49 year old male with diabetic foot. EXAM: LEFT FOOT - 2 VIEW COMPARISON:  Left foot radiograph dated 09/11/2019. FINDINGS: Evaluation is limited due to positioning. Fractures of the midportion of the fourth and fifth metacarpal with interval progression of bridging callus formation. No new fracture identified. There is no dislocation. There is diffuse soft tissue swelling of the foot. No radiopaque foreign object or soft tissue gas. IMPRESSION: 1. Healing fractures of the fourth and fifth metacarpal. 2. Diffuse soft tissue swelling. No radiopaque foreign object or soft tissue gas. Electronically Signed   By: Elgie Collard M.D.   On: 10/13/2019 16:49   EEG adult  Result Date: 10/27/2019 Charlsie Quest, MD     10/28/2019 10:13 AM Patient Name: KEMUEL ARN MRN: [id number] Epilepsy Attending: Charlsie Quest Referring Physician/Provider: Dr. [name] Date: 10/27/2019 Duration: 22.22 minutes Patient history: 49 year old male with altered mental status. EEG evaluate for seizures. Level of alertness: Awake, asleep AEDs during EEG study: Keppra Technical aspects: This EEG study was done with scalp electrodes positioned according to the 10-20 International system of  electrode placement. Electrical activity was acquired at a sampling rate of 500Hz  and reviewed with a high frequency filter of 70Hz  and a low frequency filter of 1Hz . EEG data were recorded continuously and digitally stored. Description: The posterior dominant rhythm consists of 8 Hz activity of moderate voltage (25-35 uV) seen predominantly in posterior head regions, symmetric and reactive to eye opening and eye closing. Sleep was characterized by vertex waves, sleep spindles (12 to 14 Hz), maximal frontocentral region. Frequent runs of sharp waves were seen in frontocentral region, maximal vertex lasting about 5 to 6 seconds consistent with brief ictal-interictal rhythmic discharges. Continuous generalized 5 to 6 Hz theta slowing was also noted. Hyperventilation and photic stimulation were not performed.   ABNORMALITY -Continuous slow, generalized -Brief ictal-interictal rhythmic discharges, bilateral frontocentral region, maximal vertex IMPRESSION: This study showed evidence of potential epileptogenicity arising from bilateral frontocentral, maximal vertex region. The sharp waves are at times rhythmic consistent with brief ictal-interictal rhythmic discharges. Additionally, there is evidence of mild diffuse encephalopathy, nonspecific etiology. Priyanka [name]   Overnight EEG with video  Result Date: 10/28/2019 Charlsie Quest, MD  10/29/2019  8:27 AM Patient Name: KAYLON HITZ MRN: [id number] Epilepsy Attending: Charlsie Quest Referring Physician/Provider: Dr. [name] Duration: 10/27/2019 1145 to 10/28/2019 1145  Patient history: 49 year old male with altered mental status. EEG evaluate for seizures.  Level of alertness: Awake, asleep AEDs during EEG study: Keppra  Technical aspects: This EEG study was done with scalp electrodes positioned according to the 10-20 International system of electrode placement. Electrical activity was acquired at a sampling rate of 500Hz  and reviewed with a high  frequency filter of 70Hz  and a low frequency filter of 1Hz . EEG data were recorded continuously and digitally stored.  Description: The posterior dominant rhythm consists of 8 Hz activity of moderate voltage (25-35 uV) seen predominantly in posterior head regions, symmetric and reactive to eye opening and eye closing. Sleep was characterized by vertex waves, sleep spindles (12 to 14 Hz), maximal frontocentral region. Initially, frequent runs of sharp waves were seen in frontocentral region, maximal vertex lasting about 5 to 6 seconds consistent with brief ictal-interictal rhythmic discharges. Continuous generalized 5 to 6 Hz theta slowing as well as intermittent generalized rhythmic 2-3hz  delta slowing  was also noted. Hyperventilation and photic stimulation were not performed.    ABNORMALITY -Continuous slow, generalized - Intermittent rhythmic delta slowing, generalized -Brief ictal-interictal rhythmic discharges, bilateral frontocentral region, maximal vertex  IMPRESSION: This study initially showed evidence of potential epileptogenicity arising from bilateral frontocentral, maximal vertex region. The sharp waves are at times rhythmic consistent with brief ictal-interictal rhythmic discharges. Additionally, there is evidence of mild diffuse encephalopathy, nonspecific etiology. No seizures were seen during thi study. EEG appears to be improving compared to previous day.  Charlsie Quest   ECHOCARDIOGRAM COMPLETE  Result Date: 10/12/2019    ECHOCARDIOGRAM REPORT   Patient Name:   ELBRIDGE MAGOWAN Date of Exam: 10/12/2019 Medical Rec #:  [id number]      Height:       74.0 in Accession #:    [id number]     Weight:       245.0 lb Date of Birth:  1970-03-17      BSA:          2.369 m Patient Age:    49 years       BP:           111/74 mmHg Patient Gender: M              HR:           75 bpm. Exam Location:  Inpatient Procedure: 2D Echo Indications:    CHF 428                  & Bacteremia 790.7  History:         Patient has no prior history of Echocardiogram examinations.                 CHF; Risk Factors:Hypertension and Diabetes. Covid +.  Sonographer:    Celene Skeen RDCS (AE) Referring Phys: [id number] Clarene Critchley [facility]  Sonographer Comments: Echo performed with patient supine and on artificial respirator. Image acquisition challenging due to respiratory motion and Image acquisition challenging due to patient body habitus. restricted mobility IMPRESSIONS  1. Extremely poor acoustic windows. LVEF appears to be depressed with diffuse hypokinesis, worse in the mid/distal inferior/inferoseptal walls. . Left ventricular ejection fraction, by estimation, is 30 to 35%. The left ventricle has moderately decreased function. The left ventricular internal cavity size was mildly dilated. Left ventricular diastolic parameters  are indeterminate.  2. Right ventricular systolic function is moderately reduced. The right ventricular size is mildly enlarged.  3. The mitral valve is normal in structure. Trivial mitral valve regurgitation.  4. The aortic valve is normal in structure. Aortic valve regurgitation is not visualized. FINDINGS  Left Ventricle: Extremely poor acoustic windows. LVEF appears to be depressed with diffuse hypokinesis, worse in the mid/distal inferior/inferoseptal walls. Left ventricular ejection fraction, by estimation, is 30 to 35%. The left ventricle has moderately decreased function. The left ventricle demonstrates regional wall motion abnormalities. The left ventricular internal cavity size was mildly dilated. There is no left ventricular hypertrophy. Left ventricular diastolic parameters are indeterminate. Right Ventricle: The right ventricular size is mildly enlarged. Right vetricular wall thickness was not assessed. Right ventricular systolic function is moderately reduced. Left Atrium: Left atrial size was normal in size. Right Atrium: Right atrial size was normal in size. Pericardium: There is no evidence of  pericardial effusion. Mitral Valve: The mitral valve is normal in structure. Trivial mitral valve regurgitation. Tricuspid Valve: The tricuspid valve is normal in structure. Tricuspid valve regurgitation is trivial. Aortic Valve: The aortic valve is normal in structure. Aortic valve regurgitation is not visualized. Pulmonic Valve: The pulmonic valve was grossly normal. Pulmonic valve regurgitation is trivial. Aorta: The aortic root is normal in size and structure. IAS/Shunts: The interatrial septum was not assessed.  LEFT VENTRICLE PLAX 2D LVIDd:         5.60 cm  Diastology LVIDs:         3.90 cm  LV e' lateral:   8.81 cm/s LV PW:         1.40 cm  LV E/e' lateral: 5.7 LV IVS:        1.10 cm  LV e' medial:    5.87 cm/s LVOT diam:     2.40 cm  LV E/e' medial:  8.5 LV SV:         64 LV SV Index:   27 LVOT Area:     4.52 cm  RIGHT VENTRICLE RV S prime:     9.79 cm/s TAPSE (M-mode): 1.3 cm LEFT ATRIUM             Index       RIGHT ATRIUM           Index LA diam:        3.10 cm 1.31 cm/m  RA Area:     18.80 cm LA Vol (A2C):   62.9 ml 26.55 ml/m RA Volume:   51.80 ml  21.86 ml/m LA Vol (A4C):   40.8 ml 17.22 ml/m LA Biplane Vol: 50.8 ml 21.44 ml/m  AORTIC VALVE LVOT Vmax:   69.80 cm/s LVOT Vmean:  49.400 cm/s LVOT VTI:    0.142 m  AORTA Ao Root diam: 3.60 cm MITRAL VALVE MV Area (PHT): 2.34 cm    SHUNTS MV Decel Time: 324 msec    Systemic VTI:  0.14 m MV E velocity: 50.10 cm/s  Systemic Diam: 2.40 cm MV A velocity: 71.60 cm/s MV E/A ratio:  0.70 Dietrich Pates MD Electronically signed by Dietrich Pates MD Signature Date/Time: 10/12/2019/1:58:41 PM    Final    ECHO TEE  Result Date: 10/24/2019    TRANSESOPHOGEAL ECHO REPORT   Patient Name:   [name] Date of Exam: 10/24/2019 Medical Rec #:  [id number]      Height:       74.0 in Accession #:    [id number]  Weight:       247.4 lb Date of Birth:  Jul 19, 1970      BSA:          2.379 m Patient Age:    49 years       BP:           120/80 mmHg Patient Gender: M               HR:           100 bpm. Exam Location:  Inpatient Procedure: Transesophageal Echo Indications:    Emobli/Bacteremia  History:        Patient has prior history of Echocardiogram examinations.  Sonographer:    Ross Ludwig RDCS (AE) Referring Phys: [id number] HAO MENG PROCEDURE: The transesophogeal probe was passed without difficulty through the esophogus of the patient. Sedation performed by different physician. The patient's vital signs; including heart rate, blood pressure, and oxygen saturation; remained stable throughout the procedure. The patient developed no complications during the procedure. IMPRESSIONS  1. Left ventricular ejection fraction, by estimation, is 50 to 55%. The left ventricle has low normal function. The left ventricle has no regional wall motion abnormalities.  2. Right ventricular systolic function is normal. The right ventricular size is normal.  3. No left atrial/left atrial appendage thrombus was detected.  4. The mitral valve is normal in structure. Trivial mitral valve regurgitation. No evidence of mitral stenosis.  5. The aortic valve is normal in structure. Aortic valve regurgitation is not visualized. No aortic stenosis is present.  6. The inferior vena cava is normal in size with greater than 50% respiratory variability, suggesting right atrial pressure of 3 mmHg. Conclusion(s)/Recommendation(s): Normal biventricular function without evidence of hemodynamically significant valvular heart disease. FINDINGS  Left Ventricle: Left ventricular ejection fraction, by estimation, is 50 to 55%. The left ventricle has low normal function. The left ventricle has no regional wall motion abnormalities. The left ventricular internal cavity size was normal in size. There is no left ventricular hypertrophy. Right Ventricle: The right ventricular size is normal. No increase in right ventricular wall thickness. Right ventricular systolic function is normal. Left Atrium: Left atrial size was normal in  size. No left atrial/left atrial appendage thrombus was detected. Right Atrium: Right atrial size was normal in size. Pericardium: There is no evidence of pericardial effusion. Mitral Valve: The mitral valve is normal in structure. Normal mobility of the mitral valve leaflets. Trivial mitral valve regurgitation. No evidence of mitral valve stenosis. There is no evidence of mitral valve vegetation. Tricuspid Valve: The tricuspid valve is normal in structure. Tricuspid valve regurgitation is mild . No evidence of tricuspid stenosis. There is no evidence of tricuspid valve vegetation. Aortic Valve: The aortic valve is normal in structure. Aortic valve regurgitation is not visualized. No aortic stenosis is present. There is no evidence of aortic valve vegetation. Pulmonic Valve: The pulmonic valve was normal in structure. Pulmonic valve regurgitation is trivial. No evidence of pulmonic stenosis. Aorta: The aortic root is normal in size and structure. Venous: The inferior vena cava is normal in size with greater than 50% respiratory variability, suggesting right atrial pressure of 3 mmHg. IAS/Shunts: No atrial level shunt detected by color flow Doppler. [name] MD Electronically signed by [name] MD Signature Date/Time: 10/24/2019/3:28:45 PM    Final    VAS US CAROTID (at [facility] and [facility] only)  Result Date: 10/26/2019 Carotid Arterial Duplex Study Indications:       17mm focus of diffusion restriction  in the left                    frontoparietal lobe concerning for acute/early subacute                    infarct vs cerebritis. Risk Factors:      Hypertension, Diabetes. Other Factors:     Covid-19, bacteremia. Comparison Study:  No prior study on file Performing Technologist: Sherren Kerns RVS  Examination Guidelines: A complete evaluation includes B-mode imaging, spectral Doppler, color Doppler, and power Doppler as needed of all accessible portions of each vessel. Bilateral testing is considered an integral part  of a complete examination. Limited examinations for reoccurring indications may be performed as noted.  Right Carotid Findings: +----------+--------+--------+--------+------------------+--------+           PSV cm/sEDV cm/sStenosisPlaque DescriptionComments +----------+--------+--------+--------+------------------+--------+ CCA Prox  66      11                                         +----------+--------+--------+--------+------------------+--------+ CCA Distal71      18                                         +----------+--------+--------+--------+------------------+--------+ ICA Prox  58      19                                         +----------+--------+--------+--------+------------------+--------+ ICA Distal90      33                                         +----------+--------+--------+--------+------------------+--------+ ECA       114     16                                         +----------+--------+--------+--------+------------------+--------+ +----------+--------+-------+--------+-------------------+           PSV cm/sEDV cmsDescribeArm Pressure (mmHG) +----------+--------+-------+--------+-------------------+ [id number]                                        +----------+--------+-------+--------+-------------------+ +---------+--------+--+--------+--+ VertebralPSV cm/s38EDV cm/s11 +---------+--------+--+--------+--+  Left Carotid Findings: +----------+--------+--------+--------+------------------+--------+           PSV cm/sEDV cm/sStenosisPlaque DescriptionComments +----------+--------+--------+--------+------------------+--------+ CCA Prox  55      14                                         +----------+--------+--------+--------+------------------+--------+ CCA Distal65      20                                         +----------+--------+--------+--------+------------------+--------+ ICA Prox  68      20                                          +----------+--------+--------+--------+------------------+--------+  ICA Distal76      23                                         +----------+--------+--------+--------+------------------+--------+ ECA       77      3                                          +----------+--------+--------+--------+------------------+--------+ +----------+--------+--------+--------+-------------------+           PSV cm/sEDV cm/sDescribeArm Pressure (mmHG) +----------+--------+--------+--------+-------------------+ [id number]                                          +----------+--------+--------+--------+-------------------+ +---------+--------+--+--------+--+ VertebralPSV cm/s63EDV cm/s18 +---------+--------+--+--------+--+   Summary: Right Carotid: The extracranial vessels were near-normal with only minimal wall                thickening or plaque. Left Carotid: The extracranial vessels were near-normal with only minimal wall               thickening or plaque. Vertebrals:  Bilateral vertebral arteries demonstrate antegrade flow. Subclavians: Normal flow hemodynamics were seen in bilateral subclavian              arteries. *See table(s) above for measurements and observations.  Electronically signed by Delia Heady MD on 10/26/2019 at 12:37:48 PM.    Final    VAS [place] LOWER EXTREMITY ARTERIAL DUPLEX  Result Date: 10/26/2019 LOWER EXTREMITY ARTERIAL DUPLEX STUDY Indications: Ulceration, and Osteomyelitis. High Risk Factors: Hypertension, Diabetes. Other Factors: Covid-19. Charcot foot.  Current ABI: N/A Comparison Study: No prior study Performing Technologist: Sherren Kerns RVS  Examination Guidelines: A complete evaluation includes B-mode imaging, spectral Doppler, color Doppler, and power Doppler as needed of all accessible portions of each vessel. Bilateral testing is considered an integral part of a complete examination. Limited examinations for reoccurring  indications may be performed as noted.  +----------+--------+-----+--------+---------+--------+ LEFT      PSV cm/sRatioStenosisWaveform Comments +----------+--------+-----+--------+---------+--------+ CFA Prox  123                  triphasic         +----------+--------+-----+--------+---------+--------+ DFA       51                   triphasic         +----------+--------+-----+--------+---------+--------+ SFA Prox  97                   triphasic         +----------+--------+-----+--------+---------+--------+ SFA Mid   88                   triphasic         +----------+--------+-----+--------+---------+--------+ SFA Distal81                   triphasic         +----------+--------+-----+--------+---------+--------+ POP Prox  60                   triphasic         +----------+--------+-----+--------+---------+--------+ POP Distal86  triphasic         +----------+--------+-----+--------+---------+--------+ ATA Distal110                  triphasic         +----------+--------+-----+--------+---------+--------+ PTA Prox  34                   triphasic         +----------+--------+-----+--------+---------+--------+ PTA Mid   29                   triphasic         +----------+--------+-----+--------+---------+--------+ PTA [id number]                  triphasic         +----------+--------+-----+--------+---------+--------+  Summary: Left: Normal waveforms and adequate flow noted.  See table(s) above for measurements and observations. Electronically signed by Lemar Livings MD on 10/26/2019 at 4:50:14 PM.    Final    VAS [place] LOWER EXTREMITY ARTERIAL DUPLEX  Result Date: 10/19/2019 LOWER EXTREMITY ARTERIAL DUPLEX STUDY Indications: Cold leg, Covid-19.  Current ABI: N/A Comparison Study: No prior study Performing Technologist: Sherren Kerns RVS  Examination Guidelines: A complete evaluation includes B-mode imaging, spectral  Doppler, color Doppler, and power Doppler as needed of all accessible portions of each vessel. Bilateral testing is considered an integral part of a complete examination. Limited examinations for reoccurring indications may be performed as noted.  +-----------+--------+-----+--------+---------+--------+ RIGHT      PSV cm/sRatioStenosisWaveform Comments +-----------+--------+-----+--------+---------+--------+ CFA Prox   67                   triphasic         +-----------+--------+-----+--------+---------+--------+ DFA        60                   triphasic         +-----------+--------+-----+--------+---------+--------+ SFA Prox   68                   triphasic         +-----------+--------+-----+--------+---------+--------+ SFA Mid    62                   triphasic         +-----------+--------+-----+--------+---------+--------+ SFA Distal 69                   triphasic         +-----------+--------+-----+--------+---------+--------+ POP Prox   47                   triphasic         +-----------+--------+-----+--------+---------+--------+ POP Distal 42                   triphasic         +-----------+--------+-----+--------+---------+--------+ ATA Distal 15                   triphasic         +-----------+--------+-----+--------+---------+--------+ PTA Prox   31                   triphasic         +-----------+--------+-----+--------+---------+--------+ PTA Mid    38                   triphasic         +-----------+--------+-----+--------+---------+--------+ PTA Distal 38  triphasic         +-----------+--------+-----+--------+---------+--------+ PERO Distal36                   triphasic         +-----------+--------+-----+--------+---------+--------+  Summary: Right: Normal examination. No evidence of arterial occlusive disease. Normal waveforms, no stenosis.  See table(s) above for measurements and  observations. Electronically signed by Sherald Hess MD on 10/19/2019 at 3:25:02 PM.    Final    VAS [place] LOWER EXTREMITY VENOUS (DVT)  Result Date: 10/26/2019  Lower Venous DVTStudy Indications: Covid-19, elevated D-Dimer.  Comparison Study: Prior negative left lower extremity venous duplex don 03/19/17                   and is available for comparison. Performing Technologist: Sherren Kerns RVS  Examination Guidelines: A complete evaluation includes B-mode imaging, spectral Doppler, color Doppler, and power Doppler as needed of all accessible portions of each vessel. Bilateral testing is considered an integral part of a complete examination. Limited examinations for reoccurring indications may be performed as noted. The reflux portion of the exam is performed with the patient in reverse Trendelenburg.  +---------+---------------+---------+-----------+----------+--------------+ RIGHT    CompressibilityPhasicitySpontaneityPropertiesThrombus Aging +---------+---------------+---------+-----------+----------+--------------+ CFV      Full           Yes      Yes                                 +---------+---------------+---------+-----------+----------+--------------+ SFJ      Full                                                        +---------+---------------+---------+-----------+----------+--------------+ FV Prox  Full                                                        +---------+---------------+---------+-----------+----------+--------------+ FV Mid   Full                                                        +---------+---------------+---------+-----------+----------+--------------+ FV DistalFull                                                        +---------+---------------+---------+-----------+----------+--------------+ PFV      Full                                                         +---------+---------------+---------+-----------+----------+--------------+ POP      Partial        Yes      Yes  Acute          +---------+---------------+---------+-----------+----------+--------------+ PTV      Full                                                        +---------+---------------+---------+-----------+----------+--------------+ PERO     Full                                                        +---------+---------------+---------+-----------+----------+--------------+   +---------+---------------+---------+-----------+----------+--------------+ LEFT     CompressibilityPhasicitySpontaneityPropertiesThrombus Aging +---------+---------------+---------+-----------+----------+--------------+ CFV      Full           Yes      Yes                                 +---------+---------------+---------+-----------+----------+--------------+ SFJ      Full                                                        +---------+---------------+---------+-----------+----------+--------------+ FV Prox  Full                                                        +---------+---------------+---------+-----------+----------+--------------+ FV Mid   Full                                                        +---------+---------------+---------+-----------+----------+--------------+ FV DistalFull                                                        +---------+---------------+---------+-----------+----------+--------------+ PFV      Full                                                        +---------+---------------+---------+-----------+----------+--------------+ POP      Full           Yes      Yes                                 +---------+---------------+---------+-----------+----------+--------------+ PTV      Full                                                         +---------+---------------+---------+-----------+----------+--------------+  PERO     Full                                                        +---------+---------------+---------+-----------+----------+--------------+     Summary: RIGHT: - Findings consistent with acute deep vein thrombosis involving the right popliteal vein. - Ultrasound characteristics of enlarged lymph nodes are noted in the groin.  LEFT: - There is no evidence of deep vein thrombosis in the lower extremity.  *See table(s) above for measurements and observations. Electronically signed by Lemar Livings MD on 10/26/2019 at 4:50:27 PM.    Final    [place] EKG SITE RITE  Result Date: 10/12/2019 If Site Rite image not attached, placement could not be confirmed due to current cardiac rhythm.

## 2019-11-12 LAB — BASIC METABOLIC PANEL
Anion gap: 10 (ref 5–15)
BUN: <5 mg/dL — ABNORMAL LOW (ref 6–20)
CO2: 25 mmol/L (ref 22–32)
Calcium: 8.8 mg/dL — ABNORMAL LOW (ref 8.9–10.3)
Chloride: 100 mmol/L (ref 98–111)
Creatinine, Ser: 0.78 mg/dL (ref 0.61–1.24)
GFR calc Af Amer: >60 mL/min (ref >60)
GFR calc non Af Amer: >60 mL/min (ref >60)
Glucose, Bld: 192 mg/dL — ABNORMAL HIGH (ref 70–99)
Potassium: 3.9 mmol/L (ref 3.5–5.1)
Sodium: 135 mmol/L (ref 135–145)

## 2019-11-12 LAB — GLUCOSE, CAPILLARY
Glucose-Capillary: 185 mg/dL — ABNORMAL HIGH (ref 70–99)
Glucose-Capillary: 174 mg/dL — ABNORMAL HIGH (ref 70–99)
Glucose-Capillary: 154 mg/dL — ABNORMAL HIGH (ref 70–99)
Glucose-Capillary: 188 mg/dL — ABNORMAL HIGH (ref 70–99)

## 2019-11-12 LAB — MAGNESIUM: Magnesium: 1.6 mg/dL — ABNORMAL LOW (ref 1.7–2.4)

## 2019-11-12 MED ORDER — FUROSEMIDE 20 MG PO TABS
20.0000 mg | ORAL_TABLET | Freq: Every day | ORAL | Status: DC
  Administered 2019-11-13 – 2019-11-15 (×3): 20 mg via ORAL
  Filled 2019-11-12 (×3): qty 1

## 2019-11-12 MED ORDER — METOPROLOL TARTRATE 50 MG PO TABS
75.0000 mg | ORAL_TABLET | Freq: Two times a day (BID) | ORAL | Status: DC
  Administered 2019-11-12 – 2019-11-15 (×5): 75 mg via ORAL
  Filled 2019-11-12 (×6): qty 1

## 2019-11-12 MED ORDER — NORTRIPTYLINE HCL 25 MG PO CAPS
25.0000 mg | ORAL_CAPSULE | Freq: Every day | ORAL | Status: DC
  Administered 2019-11-12 – 2019-11-14 (×3): 25 mg via ORAL
  Filled 2019-11-12 (×4): qty 1

## 2019-11-12 MED ORDER — MAGNESIUM SULFATE 2 GM/50ML IV SOLN
2.0000 g | Freq: Once | INTRAVENOUS | Status: AC
  Administered 2019-11-12: 2 g via INTRAVENOUS
  Filled 2019-11-12: qty 50

## 2019-11-12 NOTE — Progress Notes (Signed)
   11/12/19 1111  Assess: MEWS Score  Temp 98.8 F (37.1 C)  BP 124/78  Pulse Rate (!) 118  Resp 18  Level of Consciousness Alert  SpO2 98 %  O2 Device Room Air  Patient Activity (if Appropriate) In chair  O2 Flow Rate (L/min) 0 L/min  Assess: MEWS Score  MEWS Temp 0  MEWS Systolic 0  MEWS Pulse 2  MEWS RR 0  MEWS LOC 0  MEWS Score 2  MEWS Score Color Yellow  Assess: if the MEWS score is Yellow or Red  Were vital signs taken at a resting state? Yes  Focused Assessment No change from prior assessment  Early Detection of Sepsis Score *See Row Information* Medium  MEWS guidelines implemented *See Row Information* Yes  Treat  MEWS Interventions Administered scheduled meds/treatments;Escalated (See documentation below)  Pain Scale 0-10  Pain Score 10 (11/12)  Pain Location Back  Pain Orientation Mid  Pain Frequency Constant  Pain Onset On-going  Breathing 0  Negative Vocalization 0  Facial Expression 0  Body Language 0  Consolability 0  PAINAD Score 0  Facial Expression 0  Body Movements 0  Muscle Tension 0  Compliance with ventilator (intubated pts.) N/A  Vocalization (extubated pts.) 0  CPOT Total 0  Complains of Shortness of breath  Interventions Medication (see MAR)  Patients response to intervention Effective  Take Vital Signs  Increase Vital Sign Frequency  Yellow: Q 2hr X 2 then Q 4hr X 2, if remains yellow, continue Q 4hrs  Escalate  MEWS: Escalate Yellow: discuss with charge nurse/RN and consider discussing with provider and RRT  Notify: Charge Nurse/RN  Name of Charge Nurse/RN Notified Jill, RN  Date Charge Nurse/RN Notified 11/12/19  Time Charge Nurse/RN Notified 1111  Document  Patient Outcome Stabilized after interventions   Patient experienced some tachycardia after ambulating to [facility] and then back to bed. Will continue to monitor.

## 2019-11-12 NOTE — Progress Notes (Signed)
PROGRESS NOTE                                                                                                                                                                                                             Patient Demographics:    Micheal Bell, is a 49 y.o. male, DOB - 1970-06-17, ZOX:[id number]  Outpatient Primary MD for the patient is [name], MD    LOS - 31  Admit date - 10/09/2019    Chief Complaint  Patient presents with  . Cough       Brief Narrative - 49 yo male with hx of HTN, T2DM, gout, IBS, fibromyalgia, obesity who presented on 8/16 for COVID pneumonia.  He decompensated on 8/19 and was intubated.  Self extubated on 8/23 and was reintubated.  He's subsequently been extubated on 8/28.  He's been on typical therapies for covid including steroids, baricitinib and remdesivir.  His hospitalization was complicated by MSSA and GBS bacteria.  Also found to have systolic heart failure.    Subjective:   Patient in bed, appears comfortable, denies any headache, no fever, no chest pain or pressure, no shortness of breath , no abdominal pain. No focal weakness.    Assessment  & Plan :   Acute Hypoxic Resp. Failure due to Acute Covid 19 Viral Pneumonitis during the ongoing 2020 Covid 19 Pandemic - he had severe hypoxia and parenchymal lung injury he was intubated and admitted by ICU, he was started on IV steroids, remdesivir and Baricitinib on 10/12/2019.  He was subsequently extubated on 10/21/2019.  His stay was complicated by bacteremia after which Baricitinib was discontinued on 10/24/2019.  His pulmonary status is gradually improving, currently he is stable and symptom-free, he is on room air, I have discussed with him about incentive spirometry, flutter valve, pulmonary toiletry, he is now off of isolation and his pulmonary issues from COVID-19 is clinically resolved.   Recent Labs  Lab 11/06/19 0339  11/07/19 0350 11/08/19 0450 11/08/19 1144 11/09/19 0430  WBC 9.7 8.1 7.7  --   --   PLT 354 305 297  --   --   CRP 11.4* 9.8* 10.4*  --   --   BNP  --   --   --  36.1 28.8  AST 21 17 20   --   --  ALT 18 17 18   --   --   ALKPHOS 89 80 83  --   --   BILITOT 0.7 0.8 0.9  --   --   ALBUMIN 2.3* 2.1* 2.2*  --   --     Sepsis with MSSA and Group B strep bacteremia due to left foot abscess.  ID following, was on IV Ancef but switch to nafcillin on 10/26/2019.  Clean TEE, history of Charcot joints, peripheral neuropathy and recent left foot soft tissue injury, MRI left foot shows an abscess, Ortho was consulted and they recommended medical management with outpatient follow-up with Dr. [name] post discharge, also has osteomyelitis, defer duration of treatment with antibiotics to ID.  Antibiotics management per ID, plan to continue total of 4 weeks of IV nafcillin, through 9/29, then start Keflex 500 mg 4 times daily, he has a follow-up arranged with Dr. [name] on October 13 at 10:30 AM .   Mild dry cough on 11/08/2019.  X-ray with mild fluid overload, likely acute on chronic diastolic CHF, improved trial of Lasix and monitor.   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  For seizures he has been started on Keppra with good effect and his mentation has improved considerably. Continue PT OT and speech, SNF VS CIR .  Neuro has seen the patient this admission.   Possible systolic heart failure as shown by TTE however this could have been transient due to sepsis, on repeat TEE his EF is now normalized to 50 to 55%.  He is currently compensated, blood pressure was too low and  after hydration has improved, continue beta-blocker.   Cyanotic right lower extremity toes.  Likely due to hypoperfusion which is transient, arterial ultrasound stable. Resolved.   History of chronic pain.  On multiple narcotics, Flexeril and benzodiazepines.  Was off of these medications for several days without any discomfort, reintroduced on his request at very low doses.  Do not escalate.  HTN - BP was low on multiple blood pressure medications, was hydrated now able to tolerate low-dose beta-blocker.  Acute DVT in the right lower extremity.  Switched to full dose Lovenox >> to Eliquis on 11/11/2019.   Hypomagnesemia.  Persistently low despite aggressive treatment, discussed with nephrologist Dr. Melanee Spry, will obtain 24-hour urinary magnesium and continue to replace as needed.  Magnesium is improved after discontinuation of PPI. IV replacement PRN.  DM type II.  Currently on Lantus and sliding scale monitor.  Lab Results  Component Value Date   HGBA1C 11.3 (H) 10/26/2019   CBG (last 3)  Recent Labs    11/11/19 1625 11/11/19 2111 11/12/19 0749  GLUCAP 155* 148* 185*      Condition -   Guarded  Family Communication  :  Sister and mother [phone] - 10/25/19, 10/27/19, 10/28/19, 11/03/19, 11/11/19  Code Status :  Full  Consults  :  PCCM, ID, Cards  Procedures  :    EEG - This study showed evidence of potential epileptogenicity arising from bilateral frontocentral, maximal vertex region. The sharp waves are at times rhythmic consistent with brief ictal-interictal rhythmic discharges. Additionally, there is evidence of mild diffuse  encephalopathy, nonspecific etiology.  Bilateral lower extremity venous ultrasound - Acute right lower extremity popliteal vein DVT.  Carotid duplex.  Nonacute.    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  CT Head -  1. No acute intracranial abnormality.  2. Area of subcortical low-density involving the posterior left frontal lobe at the convexity, likely encephalomalacia and sequela of remote infarct. There are no prior exams for comparison to establish chronicity. MRI could be considered for further evaluation based on clinical concern. 3. Paranasal sinus fluid levels on opacification of the right greater than left mastoid air cells, possibly related to intubation.  R. Leg Arterial US -  No evidence of arterial occlusive disease. Normal waveforms, no stenosis  TTE -  1. Extremely poor acoustic windows. LVEF appears to be depressed with diffuse hypokinesis, worse in the mid/distal inferior/inferoseptal walls. . Left ventricular ejection fraction, by estimation, is 30 to 35%. The left ventricle has moderately decreased function. The left ventricular internal cavity size was mildly dilated. Left ventricular diastolic parameters are indeterminate.  2. Right ventricular systolic function is moderately reduced. The right ventricular size is mildly enlarged.  3. The mitral valve is normal in structure. Trivial mitral valve regurgitation.  4. The aortic valve is normal in structure. Aortic valve regurgitation is not visualized  TEE - 1. Left ventricular ejection fraction, by estimation, is 50 to 55%. The left ventricle has low normal function. The left ventricle has no regional wall motion abnormalities.  2. Right ventricular systolic function is normal. The right ventricular size is normal.  3. No left atrial/left atrial appendage thrombus was detected.  4. The mitral valve is normal in structure. Trivial mitral valve regurgitation. No evidence of mitral stenosis.  5. The aortic valve is normal in structure. Aortic valve regurgitation is not visualized. No aortic stenosis is present.  6. The inferior vena cava is normal in size with greater than 50% respiratory variability, suggesting right atrial pressure of 3 mmHg.  CT - 1. No acute intracranial abnormality. 2.  Area of subcortical low-density involving the posterior left frontal lobe at the convexity, likely encephalomalacia and sequela of remote infarct. There are no prior exams for comparison to establish chronicity. MRI could be considered for further evaluation based on clinical concern. 3. Paranasal sinus fluid levels on opacification of the right greater than left mastoid air cells, possibly related to intubation    PUD Prophylaxis : PPI  Disposition Plan  :    Status is: Inpatient  Remains inpatient appropriate because:IV treatments appropriate due to intensity of illness or inability to take PO   Dispo: The patient is from: Home              Anticipated d/c is to: Home              Anticipated d/c date is: 1 day              Patient currently is medically stable to d/c. to CIR when bed is available.   DVT Prophylaxis  :  Lovenox    Lab Results  Component Value Date   PLT 297 11/08/2019    Diet :  Diet Order            Diet heart healthy/carb modified Room service appropriate? No; Fluid consistency: Thin  Diet effective now  Inpatient Medications  Scheduled Meds: . apixaban  5 mg Oral BID  . atorvastatin  40 mg Oral Daily  . buPROPion  150 mg Oral Daily  . Chlorhexidine Gluconate Cloth  6 each Topical Daily  . docusate sodium  100 mg Oral BID  . [START ON 11/13/2019] furosemide  20 mg Oral Daily  . insulin aspart  0-15 Units Subcutaneous TID WC  . insulin glargine  20 Units Subcutaneous Daily  . levETIRAcetam  500 mg Oral BID  . magnesium oxide  800 mg Oral BID  . melatonin  3 mg Oral QHS  . metoprolol tartrate  75 mg Oral BID  . multivitamin with minerals  1 tablet Oral Daily  . polyethylene glycol  17 g Oral Daily  . Ensure Max Protein  11 oz Oral QHS  . senna  2 tablet Oral BID  . sodium chloride flush  10-40 mL Intracatheter Q12H   Continuous Infusions: . sodium chloride 10 mL/hr at 11/02/19 0600  . nafcillin (NAFCIL) continuous infusion  20.8 mL/hr at 11/11/19 2300   PRN Meds:.sodium chloride, acetaminophen, bisacodyl, cyclobenzaprine, HYDROcodone-acetaminophen, Ipratropium-Albuterol, menthol-cetylpyridinium, zolpidem  Antibiotics  :    Anti-infectives (From admission, onward)   Start     Dose/Rate Route Frequency Ordered Stop   10/31/19 1415  nafcillin 12 g in sodium chloride 0.9 % 500 mL continuous infusion  Status:  Discontinued        12 g 20.8 mL/hr over 24 Hours Intravenous Every 24 hours 10/31/19 0608 10/31/19 0609   10/29/19 1415  nafcillin 12 g in dextrose 5 % 500 mL IVPB  Status:  Discontinued        12 g 20.8 mL/hr over 24 Hours Intravenous Every 24 hours 10/29/19 0434 10/29/19 0507   10/29/19 1415  nafcillin 12 g in sodium chloride 0.9 % 500 mL continuous infusion        12 g 20.8 mL/hr over 24 Hours Intravenous Every 24 hours 10/29/19 0507 11/22/19 2359   10/26/19 1415  nafcillin 12 g in dextrose 5 % 500 mL IVPB  Status:  Discontinued        12 g 20.8 mL/hr over 24 Hours Intravenous Every 24 hours 10/26/19 1414 10/29/19 0434   10/26/19 1400  nafcillin injection 12 g  Status:  Discontinued        12 g Intravenous Daily 10/26/19 1238 10/26/19 1242   10/26/19 1400  nafcillin 12 g in dextrose 5 % 50 mL IVPB  Status:  Discontinued        12 g 100 mL/hr over 30 Minutes Intravenous Every 24 hours 10/26/19 1242 10/26/19 1339   10/26/19 1400  nafcillin 12 g in dextrose 5 % 500 mL IVPB  Status:  Discontinued        12 g 20.8 mL/hr over 24 Hours Intravenous Every 24 hours 10/26/19 1337 10/26/19 1416   10/26/19 1200  nafcillin 2 g in sodium chloride 0.9 % 100 mL IVPB  Status:  Discontinued        2 g 200 mL/hr over 30 Minutes Intravenous Every 4 hours 10/26/19 1010 10/26/19 1238   10/12/19 2200  cefTRIAXone (ROCEPHIN) 2 g in sodium chloride 0.9 % 100 mL IVPB  Status:  Discontinued        2 g 200 mL/hr over 30 Minutes Intravenous Every 24 hours 10/12/19 0441 10/12/19 0921   10/12/19 0930  ceFAZolin (ANCEF) IVPB  2g/100 mL premix  Status:  Discontinued        2  g 200 mL/hr over 30 Minutes Intravenous Every 8 hours 10/12/19 0921 10/26/19 1010   10/11/19 1000  remdesivir 100 mg in sodium chloride 0.9 % 100 mL IVPB       "Followed by" Linked Group Details   100 mg 200 mL/hr over 30 Minutes Intravenous Daily 10/10/19 0050 10/14/19 0956   10/11/19 0800  vancomycin (VANCOCIN) IVPB 1000 mg/200 mL premix  Status:  Discontinued        1,000 mg 200 mL/hr over 60 Minutes Intravenous Every 8 hours 10/10/19 2239 10/12/19 0921   10/10/19 2230  vancomycin (VANCOCIN) IVPB 1000 mg/200 mL premix        1,000 mg 200 mL/hr over 60 Minutes Intravenous Every 1 hr x 2 10/10/19 2208 10/11/19 0052   10/10/19 2215  cefTRIAXone (ROCEPHIN) 2 g in sodium chloride 0.9 % 100 mL IVPB  Status:  Discontinued        2 g 200 mL/hr over 30 Minutes Intravenous Every 24 hours 10/10/19 2205 10/12/19 0441   10/10/19 0100  remdesivir 100 mg in sodium chloride 0.9 % 100 mL IVPB       "Followed by" Linked Group Details   100 mg 200 mL/hr over 30 Minutes Intravenous Every 30 min 10/10/19 0050 10/10/19 0332        Susa Raring M.D on 11/12/2019 at 10:31 AM  To page go to [email or web address] -  [facility] -  Office  [phone]   See all Orders from today for further details    Objective:   Vitals:   11/11/19 1403 11/11/19 1944 11/12/19 0423 11/12/19 0500  BP: 123/81 135/74 132/73   Pulse: 92 95 96   Resp: 20 17 18    Temp: 98.1 F (36.7 C) 98.2 F (36.8 C) 98 F (36.7 C)   TempSrc: Axillary Oral Oral   SpO2: 97% 94% 95%   Weight:    114.3 kg  Height:        Wt Readings from Last 3 Encounters:  11/12/19 114.3 kg  09/11/19 111.1 kg  05/12/17 (!) 145.2 kg     Intake/Output Summary (Last 24 hours) at 11/12/2019 1031 Last data filed at 11/12/2019 0981 Gross per 24 hour  Intake 1652.88 ml  Output 575 ml  Net 1077.88 ml     Physical Exam  Awake Alert, No new F.N deficits, chronic bilateral rotator cuff injury  induced bilateral upper extremity weakness in the deltoid area, per patient this is old and unchanged,  chronic foot wounds on both feet under bandage, right arm PICC line in place. [place].AT,PERRAL Supple Neck,No JVD, No cervical lymphadenopathy appriciated.  Symmetrical Chest wall movement, Good air movement bilaterally, CTAB RRR,No Gallops, Rubs or new Murmurs, No Parasternal Heave +ve B.Sounds, Abd Soft, No tenderness, No organomegaly appriciated, No rebound - guarding or rigidity. No Cyanosis, Clubbing or edema, No new Rash or bruise     Data Review:    CBC Recent Labs  Lab 11/06/19 0339 11/07/19 0350 11/08/19 0450  WBC 9.7 8.1 7.7  HGB 10.2* 9.4* 9.3*  HCT 32.3* 29.0* 29.2*  PLT 354 305 297  MCV 94.4 92.9 92.1  MCH 29.8 30.1 29.3  MCHC 31.6 32.4 31.8  RDW 13.4 13.6 13.5  LYMPHSABS 3.1 2.5 2.4  MONOABS 0.7 0.8 0.7  EOSABS 0.5 0.4 0.3  BASOSABS 0.0 0.0 0.0    Recent Labs  Lab 11/06/19 0339 11/06/19 0339 11/07/19 0350 11/07/19 0350 11/08/19 0450 11/08/19 1144 11/09/19 0430 11/10/19 0437 11/11/19 0306 11/12/19 0321  NA 134*   < >  136   < > 134*  --  133* 135 136 135  K 4.0   < > 3.7   < > 3.8  --  3.7 3.7 3.8 3.9  CL 98   < > 101   < > 99  --  97* 98 98 100  CO2 26   < > 25   < > 25  --  26 27 26 25   GLUCOSE 163*   < > 141*   < > 136*  --  139* 147* 138* 192*  BUN <5*   < > <5*   < > 5*  --  6 5* <5* <5*  CREATININE 0.78   < > 0.75   < > 0.70  --  0.71 0.72 0.73 0.78  CALCIUM 8.9   < > 8.6*   < > 8.7*  --  8.7* 8.7* 9.0 8.8*  AST 21  --  17  --  20  --   --   --   --   --   ALT 18  --  17  --  18  --   --   --   --   --   ALKPHOS 89  --  80  --  83  --   --   --   --   --   BILITOT 0.7  --  0.8  --  0.9  --   --   --   --   --   ALBUMIN 2.3*  --  2.1*  --  2.2*  --   --   --   --   --   MG 1.8   < > 1.5*   < > 1.7  --  1.6* 2.0 1.7 1.6*  CRP 11.4*  --  9.8*  --  10.4*  --   --   --   --   --   BNP  --   --   --   --   --  36.1 28.8  --   --   --    < > =  values in this interval not displayed.    ------------------------------------------------------------------------------------------------------------------ No results for input(s): CHOL, HDL, LDLCALC, TRIG, CHOLHDL, LDLDIRECT in the last 72 hours.  Lab Results  Component Value Date   HGBA1C 11.3 (H) 10/26/2019   ------------------------------------------------------------------------------------------------------------------ No results for input(s): TSH, T4TOTAL, T3FREE, THYROIDAB in the last 72 hours.  Invalid input(s): FREET3  Cardiac Enzymes No results for input(s): CKMB, TROPONINI, MYOGLOBIN in the last 168 hours.  Invalid input(s): CK ------------------------------------------------------------------------------------------------------------------    Component Value Date/Time   BNP 28.8 11/09/2019 0430    Micro Results No results found for this or any previous visit (from the past 240 hour(s)).  Radiology Reports CT HEAD WO CONTRAST  Result Date: 10/19/2019 CLINICAL DATA:  Deliriums.  Mental status change. EXAM: CT HEAD WITHOUT CONTRAST TECHNIQUE: Contiguous axial images were obtained from the base of the skull through the vertex without intravenous contrast. COMPARISON:  None. FINDINGS: Brain: Area subcortical low-density involving the posterior left frontal lobe at the convexity, series 3, image 25 and series 5, image 49, likely encephalomalacia and sequela of remote infarct, however nonspecific. No hemorrhage. No evidence of acute ischemia. No hydrocephalus, midline shift or mass effect. No subdural or extra-axial collection. Vascular: Atherosclerosis of skullbase vasculature without hyperdense vessel or abnormal calcification. Skull: No fracture or focal lesion. Sinuses/Orbits: Nasogastric tube in place. Fluid levels in the sphenoid sinuses and right maxillary sinus  with scattered opacification of ethmoid air cells may be related to intubation. Opacification of the right  greater than left mastoid air cells. Other: None. IMPRESSION: 1. No acute intracranial abnormality. 2. Area of subcortical low-density involving the posterior left frontal lobe at the convexity, likely encephalomalacia and sequela of remote infarct. There are no prior exams for comparison to establish chronicity. MRI could be considered for further evaluation based on clinical concern. 3. Paranasal sinus fluid levels on opacification of the right greater than left mastoid air cells, possibly related to intubation. Electronically Signed   By: Narda Rutherford M.D.   On: 10/19/2019 15:30   CT ANGIO CHEST PE W OR WO CONTRAST  Result Date: 10/23/2019 CLINICAL DATA:  COVID pneumonia, acute hypoxic respiratory failure, positive D-dimer EXAM: CT ANGIOGRAPHY CHEST WITH CONTRAST TECHNIQUE: Multidetector CT imaging of the chest was performed using the standard protocol during bolus administration of intravenous contrast. Multiplanar CT image reconstructions and MIPs were obtained to evaluate the vascular anatomy. CONTRAST:  75mL OMNIPAQUE IOHEXOL 350 MG/ML SOLN COMPARISON:  None. FINDINGS: Cardiovascular: Satisfactory opacification of the pulmonary arteries to the segmental level. No evidence of pulmonary embolism. The central pulmonary arteries are of normal caliber. There is moderate coronary artery calcification. Normal heart size. No pericardial effusion. The thoracic aorta is unremarkable save for bovine arch anatomy. Right upper extremity PICC line tip is seen within the superior vena cava. Mediastinum/Nodes: No enlarged mediastinal, hilar, or axillary lymph nodes. Thyroid gland, trachea, and esophagus demonstrate no significant findings. Nasoenteric feeding tube extends into the upper abdomen beyond the margin of the examination. Lungs/Pleura: Lung volumes are small. Evaluation is limited by motion artifact, however, multifocal pulmonary infiltrates are identified demonstrating a peripheral and basal predominance,  likely infectious or inflammatory in the acute setting. No central obstructing lesion. No pneumothorax or pleural effusion. No superimposed interstitial edema. Upper Abdomen: No acute abnormality. Musculoskeletal: No chest wall abnormality. No acute or significant osseous findings. Review of the MIP images confirms the above findings. IMPRESSION: 1. No evidence of pulmonary embolism. 2. Multifocal pulmonary infiltrates are identified demonstrating a peripheral and basal predominance, likely infectious or inflammatory in the acute setting. 3. Moderate coronary artery calcification. Electronically Signed   By: Helyn Numbers MD   On: 10/23/2019 16:25   MR ANGIO HEAD WO CONTRAST  Result Date: 10/29/2019 CLINICAL DATA:  Neuro deficit with stroke suspected EXAM: MRA HEAD WITHOUT CONTRAST TECHNIQUE: Angiographic images of the Circle of Willis were obtained using MRA technique without intravenous contrast. COMPARISON:  Four days ago FINDINGS: History of bacteremia and embolic infarction. No vessel beading, stenosis, or pseudoaneurysm. IMPRESSION: Negative intracranial MRA. Electronically Signed   By: Marnee Spring M.D.   On: 10/29/2019 12:04   MR BRAIN WO CONTRAST  Result Date: 10/25/2019 CLINICAL DATA:  Neuro deficit, acute, stroke suspected. Additional history provided: COVID positive. Additional history obtained from electronic MEDICAL RECORD NUMBERSepsis with MSSA and group B strep bacteremia. EXAM: MRI HEAD WITHOUT CONTRAST TECHNIQUE: Multiplanar, multiecho pulse sequences of the brain and surrounding structures were obtained without intravenous contrast. COMPARISON:  Head CT 10/19/2019. FINDINGS: Brain: The examination is limited by poor signal on several sequences as well as motion degradation. Most notably, there is moderate motion degradation of the axial T2 weighted sequence, severe motion degradation of the axial SWI sequence and moderate motion degradation of the coronal T2 weighted sequence. Cerebral volume  is normal for age. There is a 17 mm focus of restricted diffusion and corresponding T2/FLAIR hyperintensity within the subcortical  left frontoparietal lobes (for instance as seen on series 2, image 39) (series 3, image 17). Additional mild scattered T2/FLAIR hyperintensity within the cerebral white matter and pons is nonspecific, but consistent with chronic small vessel ischemic disease. Severe motion degradation of the axial SWI sequence precludes adequate evaluation for intracranial blood products. No extra-axial fluid collection. No midline shift. Vascular: Expected proximal arterial flow voids. Skull and upper cervical spine: No focal marrow lesion is identified within described limitations. Sinuses/Orbits: Visualized orbits show no acute finding. Air-fluid levels within the sphenoid and right maxillary sinuses. Partial opacification of right ethmoid air cells. Bilateral mastoid effusions. These results will be called to the ordering clinician or representative by the Radiologist Assistant, and communication documented in the PACS or [employer]. IMPRESSION: Examination limited by poor signal on several sequences as well as motion degradation, as described. 17 mm focus of restricted diffusion and T2/FLAIR hyperintensity within the subcortical left frontoparietal lobes. This finding is nonspecific, but given the patient's history, the primary differential considerations are acute/early subacute infarct versus cerebritis. Consider contrast-enhanced MR imaging of the brain for further evaluation of this lesion. Mild chronic small vessel ischemic disease. Paranasal sinus disease with air-fluid levels. Bilateral mastoid effusions. Electronically Signed   By: Jackey Loge DO   On: 10/25/2019 15:16   MR BRAIN W CONTRAST  Result Date: 10/25/2019 CLINICAL DATA:  Brain mass follow-up EXAM: MRI HEAD WITH CONTRAST TECHNIQUE: Multiplanar, multiecho pulse sequences of the brain and surrounding structures were obtained  with intravenous contrast. CONTRAST:  10mL GADAVIST GADOBUTROL 1 MMOL/ML IV SOLN COMPARISON:  Brain MRI without contrast 10/25/2019 FINDINGS: Brain: There is a peripherally enhancing lesion at the base of the left precentral gyrus that measures 0.9 x 0.7 x 1.8 cm. There are no other areas of abnormal contrast enhancement. 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 Electronically Signed   By: Deatra Robinson M.D.   On: 10/25/2019 22:47   MR FOOT LEFT WO CONTRAST  Result Date: 10/25/2019 CLINICAL DATA:  Bacteremia left foot pain and diabetic EXAM: MRI OF THE LEFT FOOT WITHOUT CONTRAST TECHNIQUE: Multiplanar, multisequence MR imaging of the left was performed. No intravenous contrast was administered. COMPARISON:  October 23, 2019 FINDINGS: Bones/Joint/Cartilage Mildly angulated nondisplaced fractures of the midshaft of the fourth and fifth metatarsals are noted. There is periosteal reaction seen along the medial margins of the metatarsal shafts. Increased heterogeneous T2 hyperintense signal with subtle T1 hypointensity seen at the base of the fourth and fifth metatarsals. No other osseous marrow signal abnormality is seen. There is no large knee joint effusions noted. The articular surfaces appear to be maintained. Ligaments The Lisfranc ligament is intact. Muscles and Tendons Increased feathery signal with atrophy is seen throughout the muscles of the forefoot. There is a small amount of fluid seen surrounding the posterior tibialis tendon. The remainder of the flexor and extensor tendons are intact. Soft tissues Lateral plantar surface of the forefoot overlying the fifth metatarsal base there is a focal area of ulceration measuring approximately 8 mm in transverse dimension. A fluid-filled sinus tract is seen extending to the overlying osseous surface. There is a multilocular fluid collection which extends around the  dorsal surface of the fifth metatarsal measuring approximately 5.4 x 0.9 x 3.4 cm. There is extensive dorsal and lateral subcutaneous edema and skin thickening. IMPRESSION: Superficial area of ulceration over the plantar lateral aspect of the fifth metatarsal base with a  fluid-filled sinus tract and loculated probable abscess extending over the dorsal surface of the fifth metatarsal measuring 5.4 x 0.9 by is a 3.4 cm. Incomplete mildly angulated fourth and fifth metatarsal shaft fractures with periosteal reaction Findings which could be suggestive of reactive marrow versus early osteomyelitis involving the base of the fourth and fifth metatarsals. Electronically Signed   By: Jonna Clark M.D.   On: 10/25/2019 19:06   DG Chest Port 1 View  Result Date: 11/09/2019 CLINICAL DATA:  Shortness of breath and cough EXAM: PORTABLE CHEST 1 VIEW COMPARISON:  October 26, 2019 FINDINGS: Central catheter tip is in the superior vena cava. No pneumothorax. There is no edema or airspace opacity. Heart size and pulmonary vascularity are normal. No adenopathy. No bone lesions. IMPRESSION: Central catheter tip in superior vena cava. No pneumothorax. Lungs clear. Cardiac silhouette normal. Electronically Signed   By: Bretta Bang III M.D.   On: 11/09/2019 13:51   DG Chest Port 1 View  Result Date: 10/26/2019 CLINICAL DATA:  Short of breath.  COVID positive EXAM: PORTABLE CHEST 1 VIEW COMPARISON:  10/25/2019 FINDINGS: Hypoventilation with decreased lung volume. Mild bibasilar airspace disease unchanged. No new infiltrate or effusion. Right arm PICC tip in the SVC unchanged. IMPRESSION: Hypoventilation with bibasilar mild airspace disease unchanged. Electronically Signed   By: Marlan Palau M.D.   On: 10/26/2019 16:10   DG Chest Port 1 View  Result Date: 10/25/2019 CLINICAL DATA:  Shortness of breath. EXAM: PORTABLE CHEST 1 VIEW COMPARISON:  October 16, 2019. FINDINGS: The heart size and mediastinal contours are within  normal limits. Hypoinflation of the lungs is noted with mild bibasilar subsegmental atelectasis. Endotracheal tube has been removed. Right-sided PICC line is unchanged in position. The visualized skeletal structures are unremarkable. IMPRESSION: Hypoinflation of the lungs with mild bibasilar subsegmental atelectasis. Electronically Signed   By: Lupita Raider M.D.   On: 10/25/2019 08:37   DG CHEST PORT 1 VIEW  Result Date: 10/16/2019 CLINICAL DATA:  Status post intubation. EXAM: PORTABLE CHEST 1 VIEW COMPARISON:  10/14/2019 FINDINGS: ET tube tip is above the carina. There is a right arm PICC line with tip at the cavoatrial junction. Lung volumes are low. Similar appearance of bilateral pulmonary opacities compatible with multifocal pneumonia. IMPRESSION: 1. Stable support apparatus. 2. Persistent bilateral pulmonary opacities compatible with multifocal pneumonia. Electronically Signed   By: Signa Kell M.D.   On: 10/16/2019 06:14   DG Chest Port 1 View  Result Date: 10/15/2019 CLINICAL DATA:  49 year old male with history of COVID infection. EXAM: PORTABLE CHEST 1 VIEW COMPARISON:  Chest x-ray 10/14/2019. FINDINGS: An endotracheal tube is in place with tip 3.6 cm above the carina. A nasogastric tube is seen extending into the stomach, however, the tip of the nasogastric tube extends below the lower margin of the image. There is a right upper extremity PICC with tip terminating in the right atrium. Lung volumes are low. Widespread patchy areas of interstitial prominence an ill-defined airspace disease again noted throughout the lungs bilaterally. Overall, aeration is essentially unchanged. No pleural effusions. No evidence of pulmonary edema. No pneumothorax. Heart size is normal. Upper mediastinal contours are within normal limits. IMPRESSION: 1. Support apparatus, as above. 2. The appearance the chest is compatible with multilobar pneumonia from reported COVID infection. Electronically Signed   By:  Trudie Reed M.D.   On: 10/15/2019 05:39   DG Chest Port 1 View  Result Date: 10/14/2019 CLINICAL DATA:  COVID-19, ARDS EXAM: PORTABLE CHEST 1  VIEW COMPARISON:  10/13/2019 FINDINGS: Single frontal view of the chest demonstrates stable position of the endotracheal tube and enteric catheter. There is a right-sided PICC tip overlying superior vena cava. The cardiac silhouette is stable. Interstitial and ground-glass opacities are seen throughout the lungs bilaterally, unchanged. Lung volumes are diminished. No effusion or pneumothorax. IMPRESSION: 1. 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 2. Stable support devices. Electronically Signed   By: Sharlet Salina M.D.   On: 10/14/2019 15:57   DG Abd Portable 1V  Result Date: 10/20/2019 CLINICAL DATA:  GI problem. EXAM: PORTABLE ABDOMEN - 1 VIEW COMPARISON:  10/16/2019 FINDINGS: Feeding tube tip is in the distal stomach or proximal duodenum. Mild gaseous distention of the stomach. Gas throughout nondistended large and small bowel. No evidence of bowel obstruction, organomegaly or free air. IMPRESSION: Feeding tube tip in the distal stomach or proximal duodenum. No evidence of obstruction or free air. Electronically Signed   By: Charlett Nose M.D.   On: 10/20/2019 21:28   DG Abd Portable 1V  Result Date: 10/16/2019 CLINICAL DATA:  Status post OG tube placement EXAM: PORTABLE ABDOMEN - 1 VIEW COMPARISON:  10/16/2019 FINDINGS: The enteric tube tip projects over the distal body of stomach in the side port is below the level of the GE junction. A few air-filled loops of small bowel are noted within the upper abdomen which measure up to 2.8 cm. IMPRESSION: Satisfactory position of enteric tube. Electronically Signed   By: Signa Kell M.D.   On: 10/16/2019 06:52   DG Foot 2 Views Left  Result Date: 10/23/2019 CLINICAL DATA:  Bacteremia, LEFT foot pain and diabetic.  EXAM: LEFT FOOT - 2 VIEW COMPARISON:  10/13/2019 and prior radiographs FINDINGS: Mildly angulated fractures of the 4th and 5th metatarsals are again noted with fracture lines still evident. Healing changes along these fractures are noted. Overlying soft tissue swelling is noted. No new fracture, subluxation or dislocation noted. No definite radiographic evidence of acute osteomyelitis noted. IMPRESSION: Mildly angulated healing fractures of the 4th and 5th metatarsals with soft tissue swelling again noted but without definite radiographic evidence of acute osteomyelitis. Consider MRI for further evaluation as clinically indicated. Electronically Signed   By: Harmon Pier M.D.   On: 10/23/2019 13:40   DG Foot 2 Views Left  Result Date: 10/13/2019 CLINICAL DATA:  49 year old male with diabetic foot. EXAM: LEFT FOOT - 2 VIEW COMPARISON:  Left foot radiograph dated 09/11/2019. FINDINGS: Evaluation is limited due to positioning. Fractures of the midportion of the fourth and fifth metacarpal with interval progression of bridging callus formation. No new fracture identified. There is no dislocation. There is diffuse soft tissue swelling of the foot. No radiopaque foreign object or soft tissue gas. IMPRESSION: 1. Healing fractures of the fourth and fifth metacarpal. 2. Diffuse soft tissue swelling. No radiopaque foreign object or soft tissue gas. Electronically Signed   By: Elgie Collard M.D.   On: 10/13/2019 16:49   EEG adult  Result Date: 10/27/2019 Charlsie Quest, MD     10/28/2019 10:13 AM Patient Name: Micheal Bell MRN: [id number] Epilepsy Attending: Charlsie Quest Referring Physician/Provider: Dr. [name] Date: 10/27/2019 Duration: 22.22 minutes Patient history: 49 year old male with altered mental status. EEG evaluate for seizures. Level of alertness: Awake, asleep AEDs during EEG study: Keppra Technical aspects: This EEG study was done with scalp electrodes positioned according to the 10-20  International system of electrode  placement. Electrical activity was acquired at a sampling rate of 500Hz  and reviewed with a high frequency filter of 70Hz  and a low frequency filter of 1Hz . EEG data were recorded continuously and digitally stored. Description: The posterior dominant rhythm consists of 8 Hz activity of moderate voltage (25-35 uV) seen predominantly in posterior head regions, symmetric and reactive to eye opening and eye closing. Sleep was characterized by vertex waves, sleep spindles (12 to 14 Hz), maximal frontocentral region. Frequent runs of sharp waves were seen in frontocentral region, maximal vertex lasting about 5 to 6 seconds consistent with brief ictal-interictal rhythmic discharges. Continuous generalized 5 to 6 Hz theta slowing was also noted. Hyperventilation and photic stimulation were not performed.   ABNORMALITY -Continuous slow, generalized -Brief ictal-interictal rhythmic discharges, bilateral frontocentral region, maximal vertex IMPRESSION: This study showed evidence of potential epileptogenicity arising from bilateral frontocentral, maximal vertex region. The sharp waves are at times rhythmic consistent with brief ictal-interictal rhythmic discharges. Additionally, there is evidence of mild diffuse encephalopathy, nonspecific etiology. Priyanka   Overnight EEG with video  Result Date: 10/28/2019 , MD     10/29/2019  8:27 AM Patient Name: TAYSON SCHNELLE MRN: Charlsie Quest Epilepsy Attending: 12/29/2019 Referring Physician/Provider: Dr. [name] Duration: 10/27/2019 1145 to 10/28/2019 1145  Patient history: 48 year old male with altered mental status. EEG evaluate for seizures.  Level of alertness: Awake, asleep AEDs during EEG study: Keppra  Technical aspects: This EEG study was done with scalp electrodes positioned according to the 10-20 International system of electrode placement. Electrical activity was acquired at a sampling rate of 500Hz  and  reviewed with a high frequency filter of 70Hz  and a low frequency filter of 1Hz . EEG data were recorded continuously and digitally stored.  Description: The posterior dominant rhythm consists of 8 Hz activity of moderate voltage (25-35 uV) seen predominantly in posterior head regions, symmetric and reactive to eye opening and eye closing. Sleep was characterized by vertex waves, sleep spindles (12 to 14 Hz), maximal frontocentral region. Initially, frequent runs of sharp waves were seen in frontocentral region, maximal vertex lasting about 5 to 6 seconds consistent with brief ictal-interictal rhythmic discharges. Continuous generalized 5 to 6 Hz theta slowing as well as intermittent generalized rhythmic 2-3hz  delta slowing  was also noted. Hyperventilation and photic stimulation were not performed.    ABNORMALITY -Continuous slow, generalized - Intermittent rhythmic delta slowing, generalized -Brief ictal-interictal rhythmic discharges, bilateral frontocentral region, maximal vertex  IMPRESSION: This study initially showed evidence of potential epileptogenicity arising from bilateral frontocentral, maximal vertex region. The sharp waves are at times rhythmic consistent with brief ictal-interictal rhythmic discharges. Additionally, there is evidence of mild diffuse encephalopathy, nonspecific etiology. No seizures were seen during thi study. EEG appears to be improving compared to previous day.  12/27/2019   ECHO TEE  Result Date: 10/24/2019    TRANSESOPHOGEAL ECHO REPORT   Patient Name:   RALPH BROUWER Date of Exam: 10/24/2019 Medical Rec #:       Height:       74.0 in Accession #:        Weight:       247.4 lb Date of Birth:  1970-04-19      BSA:          2.379 m Patient Age:    49 years       BP:           120/80 mmHg Patient Gender: M  HR:           100 bpm. Exam Location:  Inpatient Procedure: Transesophageal Echo Indications:    Emobli/Bacteremia  History:         Patient has prior history of Echocardiogram examinations.  Sonographer:    Ross Ludwig RDCS (AE) Referring Phys: [phone] HAO MENG PROCEDURE: The transesophogeal probe was passed without difficulty through the esophogus of the patient. Sedation performed by different physician. The patient's vital signs; including heart rate, blood pressure, and oxygen saturation; remained stable throughout the procedure. The patient developed no complications during the procedure. IMPRESSIONS  1. Left ventricular ejection fraction, by estimation, is 50 to 55%. The left ventricle has low normal function. The left ventricle has no regional wall motion abnormalities.  2. Right ventricular systolic function is normal. The right ventricular size is normal.  3. No left atrial/left atrial appendage thrombus was detected.  4. The mitral valve is normal in structure. Trivial mitral valve regurgitation. No evidence of mitral stenosis.  5. The aortic valve is normal in structure. Aortic valve regurgitation is not visualized. No aortic stenosis is present.  6. The inferior vena cava is normal in size with greater than 50% respiratory variability, suggesting right atrial pressure of 3 mmHg. Conclusion(s)/Recommendation(s): Normal biventricular function without evidence of hemodynamically significant valvular heart disease. FINDINGS  Left Ventricle: Left ventricular ejection fraction, by estimation, is 50 to 55%. The left ventricle has low normal function. The left ventricle has no regional wall motion abnormalities. The left ventricular internal cavity size was normal in size. There is no left ventricular hypertrophy. Right Ventricle: The right ventricular size is normal. No increase in right ventricular wall thickness. Right ventricular systolic function is normal. Left Atrium: Left atrial size was normal in size. No left atrial/left atrial appendage thrombus was detected. Right Atrium: Right atrial size was normal in size. Pericardium: There is  no evidence of pericardial effusion. Mitral Valve: The mitral valve is normal in structure. Normal mobility of the mitral valve leaflets. Trivial mitral valve regurgitation. No evidence of mitral valve stenosis. There is no evidence of mitral valve vegetation. Tricuspid Valve: The tricuspid valve is normal in structure. Tricuspid valve regurgitation is mild . No evidence of tricuspid stenosis. There is no evidence of tricuspid valve vegetation. Aortic Valve: The aortic valve is normal in structure. Aortic valve regurgitation is not visualized. No aortic stenosis is present. There is no evidence of aortic valve vegetation. Pulmonic Valve: The pulmonic valve was normal in structure. Pulmonic valve regurgitation is trivial. No evidence of pulmonic stenosis. Aorta: The aortic root is normal in size and structure. Venous: The inferior vena cava is normal in size with greater than 50% respiratory variability, suggesting right atrial pressure of 3 mmHg. IAS/Shunts: No atrial level shunt detected by color flow Doppler. [name] MD Electronically signed by [name] MD Signature Date/Time: 10/24/2019/3:28:45 PM    Final    VAS US CAROTID (at [facility] and [facility] only)  Result Date: 10/26/2019 Carotid Arterial Duplex Study Indications:       65mm focus of diffusion restriction in the left                    frontoparietal lobe concerning for acute/early subacute                    infarct vs cerebritis. Risk Factors:      Hypertension, Diabetes. Other Factors:     Covid-19, bacteremia. Comparison Study:  No prior study  on file Performing Technologist: Sherren Kerns RVS  Examination Guidelines: A complete evaluation includes B-mode imaging, spectral Doppler, color Doppler, and power Doppler as needed of all accessible portions of each vessel. Bilateral testing is considered an integral part of a complete examination. Limited examinations for reoccurring indications may be performed as noted.  Right Carotid Findings:  +----------+--------+--------+--------+------------------+--------+           PSV cm/sEDV cm/sStenosisPlaque DescriptionComments +----------+--------+--------+--------+------------------+--------+ CCA Prox  66      11                                         +----------+--------+--------+--------+------------------+--------+ CCA Distal71      18                                         +----------+--------+--------+--------+------------------+--------+ ICA Prox  58      19                                         +----------+--------+--------+--------+------------------+--------+ ICA Distal90      33                                         +----------+--------+--------+--------+------------------+--------+ ECA       114     16                                         +----------+--------+--------+--------+------------------+--------+ +----------+--------+-------+--------+-------------------+           PSV cm/sEDV cmsDescribeArm Pressure (mmHG) +----------+--------+-------+--------+-------------------+ [id number]                                        +----------+--------+-------+--------+-------------------+ +---------+--------+--+--------+--+ VertebralPSV cm/s38EDV cm/s11 +---------+--------+--+--------+--+  Left Carotid Findings: +----------+--------+--------+--------+------------------+--------+           PSV cm/sEDV cm/sStenosisPlaque DescriptionComments +----------+--------+--------+--------+------------------+--------+ CCA Prox  55      14                                         +----------+--------+--------+--------+------------------+--------+ CCA Distal65      20                                         +----------+--------+--------+--------+------------------+--------+ ICA Prox  68      20                                         +----------+--------+--------+--------+------------------+--------+ ICA Distal76      23                                          +----------+--------+--------+--------+------------------+--------+  ECA       77      3                                          +----------+--------+--------+--------+------------------+--------+ +----------+--------+--------+--------+-------------------+           PSV cm/sEDV cm/sDescribeArm Pressure (mmHG) +----------+--------+--------+--------+-------------------+ [id number]                                          +----------+--------+--------+--------+-------------------+ +---------+--------+--+--------+--+ VertebralPSV cm/s63EDV cm/s18 +---------+--------+--+--------+--+   Summary: Right Carotid: The extracranial vessels were near-normal with only minimal wall                thickening or plaque. Left Carotid: The extracranial vessels were near-normal with only minimal wall               thickening or plaque. Vertebrals:  Bilateral vertebral arteries demonstrate antegrade flow. Subclavians: Normal flow hemodynamics were seen in bilateral subclavian              arteries. *See table(s) above for measurements and observations.  Electronically signed by Delia Heady MD on 10/26/2019 at 12:37:48 PM.    Final    VAS [place] LOWER EXTREMITY ARTERIAL DUPLEX  Result Date: 10/26/2019 LOWER EXTREMITY ARTERIAL DUPLEX STUDY Indications: Ulceration, and Osteomyelitis. High Risk Factors: Hypertension, Diabetes. Other Factors: Covid-19. Charcot foot.  Current ABI: N/A Comparison Study: No prior study Performing Technologist: Sherren Kerns RVS  Examination Guidelines: A complete evaluation includes B-mode imaging, spectral Doppler, color Doppler, and power Doppler as needed of all accessible portions of each vessel. Bilateral testing is considered an integral part of a complete examination. Limited examinations for reoccurring indications may be performed as noted.  +----------+--------+-----+--------+---------+--------+ LEFT      PSV  cm/sRatioStenosisWaveform Comments +----------+--------+-----+--------+---------+--------+ CFA Prox  123                  triphasic         +----------+--------+-----+--------+---------+--------+ DFA       51                   triphasic         +----------+--------+-----+--------+---------+--------+ SFA Prox  97                   triphasic         +----------+--------+-----+--------+---------+--------+ SFA Mid   88                   triphasic         +----------+--------+-----+--------+---------+--------+ SFA Distal81                   triphasic         +----------+--------+-----+--------+---------+--------+ POP Prox  60                   triphasic         +----------+--------+-----+--------+---------+--------+ POP Distal86                   triphasic         +----------+--------+-----+--------+---------+--------+ ATA Distal110                  triphasic         +----------+--------+-----+--------+---------+--------+ PTA Prox  34  triphasic         +----------+--------+-----+--------+---------+--------+ PTA Mid   29                   triphasic         +----------+--------+-----+--------+---------+--------+ PTA Distal125                  triphasic         +----------+--------+-----+--------+---------+--------+  Summary: Left: Normal waveforms and adequate flow noted.  See table(s) above for measurements and observations. Electronically signed by Lemar Livings MD on 10/26/2019 at 4:50:14 PM.    Final    VAS [place] LOWER EXTREMITY ARTERIAL DUPLEX  Result Date: 10/19/2019 LOWER EXTREMITY ARTERIAL DUPLEX STUDY Indications: Cold leg, Covid-19.  Current ABI: N/A Comparison Study: No prior study Performing Technologist: Sherren Kerns RVS  Examination Guidelines: A complete evaluation includes B-mode imaging, spectral Doppler, color Doppler, and power Doppler as needed of all accessible portions of each vessel. Bilateral testing  is considered an integral part of a complete examination. Limited examinations for reoccurring indications may be performed as noted.  +-----------+--------+-----+--------+---------+--------+ RIGHT      PSV cm/sRatioStenosisWaveform Comments +-----------+--------+-----+--------+---------+--------+ CFA Prox   67                   triphasic         +-----------+--------+-----+--------+---------+--------+ DFA        60                   triphasic         +-----------+--------+-----+--------+---------+--------+ SFA Prox   68                   triphasic         +-----------+--------+-----+--------+---------+--------+ SFA Mid    62                   triphasic         +-----------+--------+-----+--------+---------+--------+ SFA Distal 69                   triphasic         +-----------+--------+-----+--------+---------+--------+ POP Prox   47                   triphasic         +-----------+--------+-----+--------+---------+--------+ POP Distal 42                   triphasic         +-----------+--------+-----+--------+---------+--------+ ATA Distal 15                   triphasic         +-----------+--------+-----+--------+---------+--------+ PTA Prox   31                   triphasic         +-----------+--------+-----+--------+---------+--------+ PTA Mid    38                   triphasic         +-----------+--------+-----+--------+---------+--------+ PTA Distal 38                   triphasic         +-----------+--------+-----+--------+---------+--------+ PERO Distal36                   triphasic         +-----------+--------+-----+--------+---------+--------+  Summary: Right: Normal examination. No evidence of arterial occlusive  disease. Normal waveforms, no stenosis.  See table(s) above for measurements and observations. Electronically signed by Sherald Hess MD on 10/19/2019 at 3:25:02 PM.    Final    VAS [place] LOWER  EXTREMITY VENOUS (DVT)  Result Date: 10/26/2019  Lower Venous DVTStudy Indications: Covid-19, elevated D-Dimer.  Comparison Study: Prior negative left lower extremity venous duplex don 03/19/17                   and is available for comparison. Performing Technologist: Sherren Kerns RVS  Examination Guidelines: A complete evaluation includes B-mode imaging, spectral Doppler, color Doppler, and power Doppler as needed of all accessible portions of each vessel. Bilateral testing is considered an integral part of a complete examination. Limited examinations for reoccurring indications may be performed as noted. The reflux portion of the exam is performed with the patient in reverse Trendelenburg.  +---------+---------------+---------+-----------+----------+--------------+ RIGHT    CompressibilityPhasicitySpontaneityPropertiesThrombus Aging +---------+---------------+---------+-----------+----------+--------------+ CFV      Full           Yes      Yes                                 +---------+---------------+---------+-----------+----------+--------------+ SFJ      Full                                                        +---------+---------------+---------+-----------+----------+--------------+ FV Prox  Full                                                        +---------+---------------+---------+-----------+----------+--------------+ FV Mid   Full                                                        +---------+---------------+---------+-----------+----------+--------------+ FV DistalFull                                                        +---------+---------------+---------+-----------+----------+--------------+ PFV      Full                                                        +---------+---------------+---------+-----------+----------+--------------+ POP      Partial        Yes      Yes                  Acute           +---------+---------------+---------+-----------+----------+--------------+ PTV      Full                                                        +---------+---------------+---------+-----------+----------+--------------+  PERO     Full                                                        +---------+---------------+---------+-----------+----------+--------------+   +---------+---------------+---------+-----------+----------+--------------+ LEFT     CompressibilityPhasicitySpontaneityPropertiesThrombus Aging +---------+---------------+---------+-----------+----------+--------------+ CFV      Full           Yes      Yes                                 +---------+---------------+---------+-----------+----------+--------------+ SFJ      Full                                                        +---------+---------------+---------+-----------+----------+--------------+ FV Prox  Full                                                        +---------+---------------+---------+-----------+----------+--------------+ FV Mid   Full                                                        +---------+---------------+---------+-----------+----------+--------------+ FV DistalFull                                                        +---------+---------------+---------+-----------+----------+--------------+ PFV      Full                                                        +---------+---------------+---------+-----------+----------+--------------+ POP      Full           Yes      Yes                                 +---------+---------------+---------+-----------+----------+--------------+ PTV      Full                                                        +---------+---------------+---------+-----------+----------+--------------+ PERO     Full                                                         +---------+---------------+---------+-----------+----------+--------------+  Summary: RIGHT: - Findings consistent with acute deep vein thrombosis involving the right popliteal vein. - Ultrasound characteristics of enlarged lymph nodes are noted in the groin.  LEFT: - There is no evidence of deep vein thrombosis in the lower extremity.  *See table(s) above for measurements and observations. Electronically signed by Lemar Livings MD on 10/26/2019 at 4:50:27 PM.    Final

## 2019-11-13 DIAGNOSIS — J22 Unspecified acute lower respiratory infection: Secondary | ICD-10-CM

## 2019-11-13 DIAGNOSIS — R0902 Hypoxemia: Secondary | ICD-10-CM

## 2019-11-13 LAB — MAGNESIUM: Magnesium: 1.6 mg/dL — ABNORMAL LOW (ref 1.7–2.4)

## 2019-11-13 LAB — BASIC METABOLIC PANEL
Anion gap: 10 (ref 5–15)
BUN: <5 mg/dL — ABNORMAL LOW (ref 6–20)
CO2: 24 mmol/L (ref 22–32)
Calcium: 8.8 mg/dL — ABNORMAL LOW (ref 8.9–10.3)
Chloride: 102 mmol/L (ref 98–111)
Creatinine, Ser: 0.62 mg/dL (ref 0.61–1.24)
GFR calc Af Amer: >60 mL/min (ref >60)
GFR calc non Af Amer: >60 mL/min (ref >60)
Glucose, Bld: 154 mg/dL — ABNORMAL HIGH (ref 70–99)
Potassium: 3.6 mmol/L (ref 3.5–5.1)
Sodium: 136 mmol/L (ref 135–145)

## 2019-11-13 LAB — GLUCOSE, CAPILLARY
Glucose-Capillary: 126 mg/dL — ABNORMAL HIGH (ref 70–99)
Glucose-Capillary: 130 mg/dL — ABNORMAL HIGH (ref 70–99)
Glucose-Capillary: 158 mg/dL — ABNORMAL HIGH (ref 70–99)
Glucose-Capillary: 189 mg/dL — ABNORMAL HIGH (ref 70–99)

## 2019-11-13 MED ORDER — MAGNESIUM SULFATE 4 GM/100ML IV SOLN
4.0000 g | Freq: Once | INTRAVENOUS | Status: AC
  Administered 2019-11-13: 4 g via INTRAVENOUS
  Filled 2019-11-13: qty 100

## 2019-11-13 NOTE — Progress Notes (Addendum)
PROGRESS NOTE                                                                                                                                                                                                             Patient Demographics:    Micheal Bell, is a 49 y.o. male, DOB - 1970-09-20, ZOX:[id number]  Outpatient Primary MD for the patient is [name], MD    LOS - 32  Admit date - 10/09/2019    Chief Complaint  Patient presents with  . Cough       Brief Narrative - 49 yo male with hx of HTN, T2DM, gout, IBS, fibromyalgia, obesity who presented on 8/16 for COVID pneumonia.  He decompensated on 8/19 and was intubated.  Self extubated on 8/23 and was reintubated.  He's subsequently been extubated on 8/28.  He's been on typical therapies for covid including steroids, baricitinib and remdesivir.  His hospitalization was complicated by MSSA and GBS bacteria.  Also found to have systolic heart failure.    Subjective:   Patient in bed, appears comfortable, denies any headache, no fever, no chest pain or pressure, no shortness of breath , no abdominal pain. No focal weakness.   Assessment  & Plan :   Acute Hypoxic Resp. Failure due to Acute Covid 19 Viral Pneumonitis during the ongoing 2020 Covid 19 Pandemic - he had severe hypoxia and parenchymal lung injury he was intubated and admitted by ICU, he was started on IV steroids, remdesivir and Baricitinib on 10/12/2019.  He was subsequently extubated on 10/21/2019.  His stay was complicated by bacteremia after which Baricitinib was discontinued on 10/24/2019.  His pulmonary status is gradually improving, currently he is stable and symptom-free, he is on room air, I have discussed with him about incentive spirometry, flutter valve, pulmonary toiletry, he is now off of isolation and his pulmonary issues from COVID-19 is clinically resolved.   Recent Labs  Lab 11/07/19 0350  11/08/19 0450 11/08/19 1144 11/09/19 0430  WBC 8.1 7.7  --   --   PLT 305 297  --   --   CRP 9.8* 10.4*  --   --   BNP  --   --  36.1 28.8  AST 17 20  --   --   ALT 17 18  --   --  ALKPHOS 80 83  --   --   BILITOT 0.8 0.9  --   --   ALBUMIN 2.1* 2.2*  --   --     Sepsis with MSSA and Group B strep bacteremia due to left foot abscess.  ID following, was on IV Ancef but switch to nafcillin on 10/26/2019.  Clean TEE, history of Charcot joints, peripheral neuropathy and recent left foot soft tissue injury, MRI left foot shows an abscess, Ortho was consulted and they recommended medical management with outpatient follow-up with Dr. [name] post discharge, also has osteomyelitis, defer duration of treatment with antibiotics to ID.  Antibiotics management per ID, plan to continue total of 4 weeks of IV nafcillin, through 9/29, then start Keflex 500 mg 4 times daily, he has a follow-up arranged with Dr. [name] on October 13 at 10:30 AM .  Mild dry cough on 11/08/2019.  X-ray with mild fluid overload, likely acute on chronic diastolic CHF, improved trial of Lasix and monitor.  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  For seizures he has been started on Keppra with good effect and his mentation has improved considerably. Continue PT OT and speech, SNF VS CIR .  Neuro has seen the patient this admission.  Possible systolic heart failure as shown by TTE however this could have been transient due to sepsis, on repeat TEE his EF is now  normalized to 50 to 55%.  He is currently compensated, blood pressure was too low and after hydration has improved, continue beta-blocker.   Cyanotic right lower extremity toes.  Likely due to hypoperfusion which is transient, arterial ultrasound stable. Resolved.   History of chronic pain.  On multiple narcotics, Flexeril and benzodiazepines.  Was off of these medications for several days without any discomfort, reintroduced on his request at very low doses.  Do not escalate.  HTN - BP was low on multiple blood pressure medications, was hydrated now able to tolerate low-dose beta-blocker.  Acute DVT in the right lower extremity.  Switched to full dose Lovenox >> to Eliquis on 11/11/2019.   Hypomagnesemia.  Has been aggressively replaced with IV and is on scheduled oral magnesium, PPI has been stopped, on lower home dose Lasix only which was actually started on 11/13/2019, 24-hour urine magnesium pending, will continue to monitor.  DM type II.  Currently on Lantus and sliding scale monitor.  Lab Results  Component Value Date   HGBA1C 11.3 (H) 10/26/2019   CBG (last 3)  Recent Labs    11/12/19 1720 11/12/19 2109 11/13/19 0741  GLUCAP 154* 188* 126*      Condition -   Guarded  Family Communication  :  Sister and mother [phone] - 10/25/19, 10/27/19, 10/28/19, 11/03/19, 11/11/19  Code Status :  Full  Consults  :  PCCM, ID, Cards  Procedures  :    EEG - This study showed evidence of potential epileptogenicity arising from bilateral frontocentral, maximal vertex region. The sharp waves are at times rhythmic consistent with brief ictal-interictal  rhythmic discharges. Additionally, there is evidence of mild diffuse encephalopathy, nonspecific etiology.  Bilateral lower extremity venous ultrasound - Acute right lower extremity popliteal vein DVT.  Carotid duplex.  Nonacute.    MRI - Peripherally enhancing lesion at the base of the left precentral gyrus, favored to indicate cerebritis  in this bacteremic patient.  CT Head -  1. No acute intracranial abnormality. 2. Area of subcortical low-density involving the posterior left frontal lobe at the convexity, likely encephalomalacia and sequela of remote infarct. There are no prior exams for comparison to establish chronicity. MRI could be considered for further evaluation based on clinical concern. 3. Paranasal sinus fluid levels on opacification of the right greater than left mastoid air cells, possibly related to intubation.  R. Leg Arterial US -  No evidence of arterial occlusive disease. Normal waveforms, no stenosis  TTE -  1. Extremely poor acoustic windows. LVEF appears to be depressed with diffuse hypokinesis, worse in the mid/distal inferior/inferoseptal walls. . Left ventricular ejection fraction, by estimation, is 30 to 35%. The left ventricle has moderately decreased function. The left ventricular internal cavity size was mildly dilated. Left ventricular diastolic parameters are indeterminate.  2. Right ventricular systolic function is moderately reduced. The right ventricular size is mildly enlarged.  3. The mitral valve is normal in structure. Trivial mitral valve regurgitation.  4. The aortic valve is normal in structure. Aortic valve regurgitation is not visualized  TEE - 1. Left ventricular ejection fraction, by estimation, is 50 to 55%. The left ventricle has low normal function. The left ventricle has no regional wall motion abnormalities.  2. Right ventricular systolic function is normal. The right ventricular size is normal.  3. No left atrial/left atrial appendage thrombus was detected.  4. The mitral valve is normal in structure. Trivial mitral valve regurgitation. No evidence of mitral stenosis.  5. The aortic valve is normal in structure. Aortic valve regurgitation is not visualized. No aortic stenosis is present.  6. The inferior vena cava is normal in size with greater than 50% respiratory variability, suggesting  right atrial pressure of 3 mmHg.  CT - 1. No acute intracranial abnormality. 2. Area of subcortical low-density involving the posterior left frontal lobe at the convexity, likely encephalomalacia and sequela of remote infarct. There are no prior exams for comparison to establish chronicity. MRI could be considered for further evaluation based on clinical concern. 3. Paranasal sinus fluid levels on opacification of the right greater than left mastoid air cells, possibly related to intubation    PUD Prophylaxis : PPI  Disposition Plan  :    Status is: Inpatient  Remains inpatient appropriate because:IV treatments appropriate due to intensity of illness or inability to take PO   Dispo: The patient is from: Home              Anticipated d/c is to: Home              Anticipated d/c date is: 1 day              Patient currently is medically stable to d/c. to CIR when bed is available.   DVT Prophylaxis  :  Lovenox    Lab Results  Component Value Date   PLT 297 11/08/2019    Diet :  Diet Order            Diet heart healthy/carb modified Room service appropriate? No; Fluid consistency: Thin  Diet effective now  Inpatient Medications  Scheduled Meds: . apixaban  5 mg Oral BID  . atorvastatin  40 mg Oral Daily  . buPROPion  150 mg Oral Daily  . Chlorhexidine Gluconate Cloth  6 each Topical Daily  . docusate sodium  100 mg Oral BID  . furosemide  20 mg Oral Daily  . insulin aspart  0-15 Units Subcutaneous TID WC  . insulin glargine  20 Units Subcutaneous Daily  . levETIRAcetam  500 mg Oral BID  . magnesium oxide  800 mg Oral BID  . melatonin  3 mg Oral QHS  . metoprolol tartrate  75 mg Oral BID  . multivitamin with minerals  1 tablet Oral Daily  . nortriptyline  25 mg Oral QHS  . polyethylene glycol  17 g Oral Daily  . Ensure Max Protein  11 oz Oral QHS  . senna  2 tablet Oral BID  . sodium chloride flush  10-40 mL Intracatheter Q12H   Continuous  Infusions: . sodium chloride 10 mL/hr at 11/02/19 0600  . magnesium sulfate bolus IVPB 4 g (11/13/19 0800)  . nafcillin (NAFCIL) continuous infusion 20.8 mL/hr at 11/13/19 0700   PRN Meds:.sodium chloride, acetaminophen, bisacodyl, cyclobenzaprine, HYDROcodone-acetaminophen, Ipratropium-Albuterol, menthol-cetylpyridinium, zolpidem  Antibiotics  :    Anti-infectives (From admission, onward)   Start     Dose/Rate Route Frequency Ordered Stop   10/31/19 1415  nafcillin 12 g in sodium chloride 0.9 % 500 mL continuous infusion  Status:  Discontinued        12 g 20.8 mL/hr over 24 Hours Intravenous Every 24 hours 10/31/19 0608 10/31/19 0609   10/29/19 1415  nafcillin 12 g in dextrose 5 % 500 mL IVPB  Status:  Discontinued        12 g 20.8 mL/hr over 24 Hours Intravenous Every 24 hours 10/29/19 0434 10/29/19 0507   10/29/19 1415  nafcillin 12 g in sodium chloride 0.9 % 500 mL continuous infusion        12 g 20.8 mL/hr over 24 Hours Intravenous Every 24 hours 10/29/19 0507 11/22/19 2359   10/26/19 1415  nafcillin 12 g in dextrose 5 % 500 mL IVPB  Status:  Discontinued        12 g 20.8 mL/hr over 24 Hours Intravenous Every 24 hours 10/26/19 1414 10/29/19 0434   10/26/19 1400  nafcillin injection 12 g  Status:  Discontinued        12 g Intravenous Daily 10/26/19 1238 10/26/19 1242   10/26/19 1400  nafcillin 12 g in dextrose 5 % 50 mL IVPB  Status:  Discontinued        12 g 100 mL/hr over 30 Minutes Intravenous Every 24 hours 10/26/19 1242 10/26/19 1339   10/26/19 1400  nafcillin 12 g in dextrose 5 % 500 mL IVPB  Status:  Discontinued        12 g 20.8 mL/hr over 24 Hours Intravenous Every 24 hours 10/26/19 1337 10/26/19 1416   10/26/19 1200  nafcillin 2 g in sodium chloride 0.9 % 100 mL IVPB  Status:  Discontinued        2 g 200 mL/hr over 30 Minutes Intravenous Every 4 hours 10/26/19 1010 10/26/19 1238   10/12/19 2200  cefTRIAXone (ROCEPHIN) 2 g in sodium chloride 0.9 % 100 mL IVPB  Status:   Discontinued        2 g 200 mL/hr over 30 Minutes Intravenous Every 24 hours 10/12/19 0441 10/12/19 0921   10/12/19 0930  ceFAZolin (ANCEF) IVPB  2g/100 mL premix  Status:  Discontinued        2 g 200 mL/hr over 30 Minutes Intravenous Every 8 hours 10/12/19 0921 10/26/19 1010   10/11/19 1000  remdesivir 100 mg in sodium chloride 0.9 % 100 mL IVPB       "Followed by" Linked Group Details   100 mg 200 mL/hr over 30 Minutes Intravenous Daily 10/10/19 0050 10/14/19 0956   10/11/19 0800  vancomycin (VANCOCIN) IVPB 1000 mg/200 mL premix  Status:  Discontinued        1,000 mg 200 mL/hr over 60 Minutes Intravenous Every 8 hours 10/10/19 2239 10/12/19 0921   10/10/19 2230  vancomycin (VANCOCIN) IVPB 1000 mg/200 mL premix        1,000 mg 200 mL/hr over 60 Minutes Intravenous Every 1 hr x 2 10/10/19 2208 10/11/19 0052   10/10/19 2215  cefTRIAXone (ROCEPHIN) 2 g in sodium chloride 0.9 % 100 mL IVPB  Status:  Discontinued        2 g 200 mL/hr over 30 Minutes Intravenous Every 24 hours 10/10/19 2205 10/12/19 0441   10/10/19 0100  remdesivir 100 mg in sodium chloride 0.9 % 100 mL IVPB       "Followed by" Linked Group Details   100 mg 200 mL/hr over 30 Minutes Intravenous Every 30 min 10/10/19 0050 10/10/19 0332        Susa Raring M.D on 11/13/2019 at 9:48 AM  To page go to [email or web address] -  [facility] -  Office  [phone]   See all Orders from today for further details    Objective:   Vitals:   11/12/19 2044 11/12/19 2301 11/13/19 0435 11/13/19 0500  BP: 108/65 117/71 118/70   Pulse: 95 100 98   Resp: 18 17 17    Temp: 99.4 F (37.4 C) 99.1 F (37.3 C) 98.1 F (36.7 C)   TempSrc: Oral Oral Oral   SpO2: 92% 93% 93%   Weight:    115.4 kg  Height:        Wt Readings from Last 3 Encounters:  11/13/19 115.4 kg  09/11/19 111.1 kg  05/12/17 (!) 145.2 kg     Intake/Output Summary (Last 24 hours) at 11/13/2019 0948 Last data filed at 11/13/2019 0700 Gross per 24 hour    Intake 952.92 ml  Output 1330 ml  Net -377.08 ml     Physical Exam  Awake Alert, No new F.N deficits, chronic bilateral rotator cuff injury induced bilateral upper extremity weakness in the deltoid area, per patient this is old and unchanged,  chronic foot wounds on both feet under bandage, right arm PICC line in place. [place].AT,PERRAL Supple Neck,No JVD, No cervical lymphadenopathy appriciated.  Symmetrical Chest wall movement, Good air movement bilaterally, CTAB RRR,No Gallops, Rubs or new Murmurs, No Parasternal Heave +ve B.Sounds, Abd Soft, No tenderness, No organomegaly appriciated, No rebound - guarding or rigidity. No Cyanosis    Data Review:    CBC Recent Labs  Lab 11/07/19 0350 11/08/19 0450  WBC 8.1 7.7  HGB 9.4* 9.3*  HCT 29.0* 29.2*  PLT 305 297  MCV 92.9 92.1  MCH 30.1 29.3  MCHC 32.4 31.8  RDW 13.6 13.5  LYMPHSABS 2.5 2.4  MONOABS 0.8 0.7  EOSABS 0.4 0.3  BASOSABS 0.0 0.0    Recent Labs  Lab 11/07/19 0350 11/07/19 0350 11/08/19 0450 11/08/19 0450 11/08/19 1144 11/09/19 0430 11/10/19 0437 11/11/19 0306 11/12/19 0321 11/13/19 0410  NA 136   < > 134*   < >  --  133* 135 136 135 136  K 3.7   < > 3.8   < >  --  3.7 3.7 3.8 3.9 3.6  CL 101   < > 99   < >  --  97* 98 98 100 102  CO2 25   < > 25   < >  --  26 27 26 25 24   GLUCOSE 141*   < > 136*   < >  --  139* 147* 138* 192* 154*  BUN <5*   < > 5*   < >  --  6 5* <5* <5* <5*  CREATININE 0.75   < > 0.70   < >  --  0.71 0.72 0.73 0.78 0.62  CALCIUM 8.6*   < > 8.7*   < >  --  8.7* 8.7* 9.0 8.8* 8.8*  AST 17  --  20  --   --   --   --   --   --   --   ALT 17  --  18  --   --   --   --   --   --   --   ALKPHOS 80  --  83  --   --   --   --   --   --   --   BILITOT 0.8  --  0.9  --   --   --   --   --   --   --   ALBUMIN 2.1*  --  2.2*  --   --   --   --   --   --   --   MG 1.5*   < > 1.7   < >  --  1.6* 2.0 1.7 1.6* 1.6*  CRP 9.8*  --  10.4*  --   --   --   --   --   --   --   BNP  --   --   --   --   36.1 28.8  --   --   --   --    < > = values in this interval not displayed.    ------------------------------------------------------------------------------------------------------------------ No results for input(s): CHOL, HDL, LDLCALC, TRIG, CHOLHDL, LDLDIRECT in the last 72 hours.  Lab Results  Component Value Date   HGBA1C 11.3 (H) 10/26/2019   ------------------------------------------------------------------------------------------------------------------ No results for input(s): TSH, T4TOTAL, T3FREE, THYROIDAB in the last 72 hours.  Invalid input(s): FREET3  Cardiac Enzymes No results for input(s): CKMB, TROPONINI, MYOGLOBIN in the last 168 hours.  Invalid input(s): CK ------------------------------------------------------------------------------------------------------------------    Component Value Date/Time   BNP 28.8 11/09/2019 0430    Micro Results No results found for this or any previous visit (from the past 240 hour(s)).  Radiology Reports CT HEAD WO CONTRAST  Result Date: 10/19/2019 CLINICAL DATA:  Deliriums.  Mental status change. EXAM: CT HEAD WITHOUT CONTRAST TECHNIQUE: Contiguous axial images were obtained from the base of the skull through the vertex without intravenous contrast. COMPARISON:  None. FINDINGS: Brain: Area subcortical low-density involving the posterior left frontal lobe at the convexity, series 3, image 25 and series 5, image 49, likely encephalomalacia and sequela of remote infarct, however nonspecific. No hemorrhage. No evidence of acute ischemia. No hydrocephalus, midline shift or mass effect. No subdural or extra-axial collection. Vascular: Atherosclerosis of skullbase vasculature without hyperdense vessel or abnormal calcification. Skull: No fracture or focal lesion. Sinuses/Orbits: Nasogastric tube in place. Fluid levels in the sphenoid sinuses  and right maxillary sinus with scattered opacification of ethmoid air cells may be related to  intubation. Opacification of the right greater than left mastoid air cells. Other: None. IMPRESSION: 1. No acute intracranial abnormality. 2. Area of subcortical low-density involving the posterior left frontal lobe at the convexity, likely encephalomalacia and sequela of remote infarct. There are no prior exams for comparison to establish chronicity. MRI could be considered for further evaluation based on clinical concern. 3. Paranasal sinus fluid levels on opacification of the right greater than left mastoid air cells, possibly related to intubation. Electronically Signed   By: Narda Rutherford M.D.   On: 10/19/2019 15:30   CT ANGIO CHEST PE W OR WO CONTRAST  Result Date: 10/23/2019 CLINICAL DATA:  COVID pneumonia, acute hypoxic respiratory failure, positive D-dimer EXAM: CT ANGIOGRAPHY CHEST WITH CONTRAST TECHNIQUE: Multidetector CT imaging of the chest was performed using the standard protocol during bolus administration of intravenous contrast. Multiplanar CT image reconstructions and MIPs were obtained to evaluate the vascular anatomy. CONTRAST:  75mL OMNIPAQUE IOHEXOL 350 MG/ML SOLN COMPARISON:  None. FINDINGS: Cardiovascular: Satisfactory opacification of the pulmonary arteries to the segmental level. No evidence of pulmonary embolism. The central pulmonary arteries are of normal caliber. There is moderate coronary artery calcification. Normal heart size. No pericardial effusion. The thoracic aorta is unremarkable save for bovine arch anatomy. Right upper extremity PICC line tip is seen within the superior vena cava. Mediastinum/Nodes: No enlarged mediastinal, hilar, or axillary lymph nodes. Thyroid gland, trachea, and esophagus demonstrate no significant findings. Nasoenteric feeding tube extends into the upper abdomen beyond the margin of the examination. Lungs/Pleura: Lung volumes are small. Evaluation is limited by motion artifact, however, multifocal pulmonary infiltrates are identified  demonstrating a peripheral and basal predominance, likely infectious or inflammatory in the acute setting. No central obstructing lesion. No pneumothorax or pleural effusion. No superimposed interstitial edema. Upper Abdomen: No acute abnormality. Musculoskeletal: No chest wall abnormality. No acute or significant osseous findings. Review of the MIP images confirms the above findings. IMPRESSION: 1. No evidence of pulmonary embolism. 2. Multifocal pulmonary infiltrates are identified demonstrating a peripheral and basal predominance, likely infectious or inflammatory in the acute setting. 3. Moderate coronary artery calcification. Electronically Signed   By: Helyn Numbers MD   On: 10/23/2019 16:25   MR ANGIO HEAD WO CONTRAST  Result Date: 10/29/2019 CLINICAL DATA:  Neuro deficit with stroke suspected EXAM: MRA HEAD WITHOUT CONTRAST TECHNIQUE: Angiographic images of the Circle of Willis were obtained using MRA technique without intravenous contrast. COMPARISON:  Four days ago FINDINGS: History of bacteremia and embolic infarction. No vessel beading, stenosis, or pseudoaneurysm. IMPRESSION: Negative intracranial MRA. Electronically Signed   By: Marnee Spring M.D.   On: 10/29/2019 12:04   MR BRAIN WO CONTRAST  Result Date: 10/25/2019 CLINICAL DATA:  Neuro deficit, acute, stroke suspected. Additional history provided: COVID positive. Additional history obtained from electronic MEDICAL RECORD NUMBERSepsis with MSSA and group B strep bacteremia. EXAM: MRI HEAD WITHOUT CONTRAST TECHNIQUE: Multiplanar, multiecho pulse sequences of the brain and surrounding structures were obtained without intravenous contrast. COMPARISON:  Head CT 10/19/2019. FINDINGS: Brain: The examination is limited by poor signal on several sequences as well as motion degradation. Most notably, there is moderate motion degradation of the axial T2 weighted sequence, severe motion degradation of the axial SWI sequence and moderate motion degradation  of the coronal T2 weighted sequence. Cerebral volume is normal for age. There is a 17 mm focus of restricted diffusion and corresponding T2/FLAIR  hyperintensity within the subcortical left frontoparietal lobes (for instance as seen on series 2, image 39) (series 3, image 17). Additional mild scattered T2/FLAIR hyperintensity within the cerebral white matter and pons is nonspecific, but consistent with chronic small vessel ischemic disease. Severe motion degradation of the axial SWI sequence precludes adequate evaluation for intracranial blood products. No extra-axial fluid collection. No midline shift. Vascular: Expected proximal arterial flow voids. Skull and upper cervical spine: No focal marrow lesion is identified within described limitations. Sinuses/Orbits: Visualized orbits show no acute finding. Air-fluid levels within the sphenoid and right maxillary sinuses. Partial opacification of right ethmoid air cells. Bilateral mastoid effusions. These results will be called to the ordering clinician or representative by the Radiologist Assistant, and communication documented in the PACS or [employer]. IMPRESSION: Examination limited by poor signal on several sequences as well as motion degradation, as described. 17 mm focus of restricted diffusion and T2/FLAIR hyperintensity within the subcortical left frontoparietal lobes. This finding is nonspecific, but given the patient's history, the primary differential considerations are acute/early subacute infarct versus cerebritis. Consider contrast-enhanced MR imaging of the brain for further evaluation of this lesion. Mild chronic small vessel ischemic disease. Paranasal sinus disease with air-fluid levels. Bilateral mastoid effusions. Electronically Signed   By: Jackey Loge DO   On: 10/25/2019 15:16   MR BRAIN W CONTRAST  Result Date: 10/25/2019 CLINICAL DATA:  Brain mass follow-up EXAM: MRI HEAD WITH CONTRAST TECHNIQUE: Multiplanar, multiecho pulse sequences  of the brain and surrounding structures were obtained with intravenous contrast. CONTRAST:  10mL GADAVIST GADOBUTROL 1 MMOL/ML IV SOLN COMPARISON:  Brain MRI without contrast 10/25/2019 FINDINGS: Brain: There is a peripherally enhancing lesion at the base of the left precentral gyrus that measures 0.9 x 0.7 x 1.8 cm. There are no other areas of abnormal contrast enhancement. 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 Electronically Signed   By: Deatra Robinson M.D.   On: 10/25/2019 22:47   MR FOOT LEFT WO CONTRAST  Result Date: 10/25/2019 CLINICAL DATA:  Bacteremia left foot pain and diabetic EXAM: MRI OF THE LEFT FOOT WITHOUT CONTRAST TECHNIQUE: Multiplanar, multisequence MR imaging of the left was performed. No intravenous contrast was administered. COMPARISON:  October 23, 2019 FINDINGS: Bones/Joint/Cartilage Mildly angulated nondisplaced fractures of the midshaft of the fourth and fifth metatarsals are noted. There is periosteal reaction seen along the medial margins of the metatarsal shafts. Increased heterogeneous T2 hyperintense signal with subtle T1 hypointensity seen at the base of the fourth and fifth metatarsals. No other osseous marrow signal abnormality is seen. There is no large knee joint effusions noted. The articular surfaces appear to be maintained. Ligaments The Lisfranc ligament is intact. Muscles and Tendons Increased feathery signal with atrophy is seen throughout the muscles of the forefoot. There is a small amount of fluid seen surrounding the posterior tibialis tendon. The remainder of the flexor and extensor tendons are intact. Soft tissues Lateral plantar surface of the forefoot overlying the fifth metatarsal base there is a focal area of ulceration measuring approximately 8 mm in transverse dimension. A fluid-filled sinus tract is seen extending to the overlying osseous surface. There is a  multilocular fluid collection which extends around the dorsal surface of the fifth metatarsal measuring approximately 5.4 x 0.9 x 3.4 cm. There is extensive dorsal and lateral subcutaneous edema and skin thickening. IMPRESSION: Superficial area of ulceration over the plantar lateral aspect of the fifth  metatarsal base with a fluid-filled sinus tract and loculated probable abscess extending over the dorsal surface of the fifth metatarsal measuring 5.4 x 0.9 by is a 3.4 cm. Incomplete mildly angulated fourth and fifth metatarsal shaft fractures with periosteal reaction Findings which could be suggestive of reactive marrow versus early osteomyelitis involving the base of the fourth and fifth metatarsals. Electronically Signed   By: Jonna Clark M.D.   On: 10/25/2019 19:06   DG Chest Port 1 View  Result Date: 11/09/2019 CLINICAL DATA:  Shortness of breath and cough EXAM: PORTABLE CHEST 1 VIEW COMPARISON:  October 26, 2019 FINDINGS: Central catheter tip is in the superior vena cava. No pneumothorax. There is no edema or airspace opacity. Heart size and pulmonary vascularity are normal. No adenopathy. No bone lesions. IMPRESSION: Central catheter tip in superior vena cava. No pneumothorax. Lungs clear. Cardiac silhouette normal. Electronically Signed   By: Bretta Bang III M.D.   On: 11/09/2019 13:51   DG Chest Port 1 View  Result Date: 10/26/2019 CLINICAL DATA:  Short of breath.  COVID positive EXAM: PORTABLE CHEST 1 VIEW COMPARISON:  10/25/2019 FINDINGS: Hypoventilation with decreased lung volume. Mild bibasilar airspace disease unchanged. No new infiltrate or effusion. Right arm PICC tip in the SVC unchanged. IMPRESSION: Hypoventilation with bibasilar mild airspace disease unchanged. Electronically Signed   By: Marlan Palau M.D.   On: 10/26/2019 16:10   DG Chest Port 1 View  Result Date: 10/25/2019 CLINICAL DATA:  Shortness of breath. EXAM: PORTABLE CHEST 1 VIEW COMPARISON:  October 16, 2019. FINDINGS:  The heart size and mediastinal contours are within normal limits. Hypoinflation of the lungs is noted with mild bibasilar subsegmental atelectasis. Endotracheal tube has been removed. Right-sided PICC line is unchanged in position. The visualized skeletal structures are unremarkable. IMPRESSION: Hypoinflation of the lungs with mild bibasilar subsegmental atelectasis. Electronically Signed   By: Lupita Raider M.D.   On: 10/25/2019 08:37   DG CHEST PORT 1 VIEW  Result Date: 10/16/2019 CLINICAL DATA:  Status post intubation. EXAM: PORTABLE CHEST 1 VIEW COMPARISON:  10/14/2019 FINDINGS: ET tube tip is above the carina. There is a right arm PICC line with tip at the cavoatrial junction. Lung volumes are low. Similar appearance of bilateral pulmonary opacities compatible with multifocal pneumonia. IMPRESSION: 1. Stable support apparatus. 2. Persistent bilateral pulmonary opacities compatible with multifocal pneumonia. Electronically Signed   By: Signa Kell M.D.   On: 10/16/2019 06:14   DG Chest Port 1 View  Result Date: 10/15/2019 CLINICAL DATA:  49 year old male with history of COVID infection. EXAM: PORTABLE CHEST 1 VIEW COMPARISON:  Chest x-ray 10/14/2019. FINDINGS: An endotracheal tube is in place with tip 3.6 cm above the carina. A nasogastric tube is seen extending into the stomach, however, the tip of the nasogastric tube extends below the lower margin of the image. There is a right upper extremity PICC with tip terminating in the right atrium. Lung volumes are low. Widespread patchy areas of interstitial prominence an ill-defined airspace disease again noted throughout the lungs bilaterally. Overall, aeration is essentially unchanged. No pleural effusions. No evidence of pulmonary edema. No pneumothorax. Heart size is normal. Upper mediastinal contours are within normal limits. IMPRESSION: 1. Support apparatus, as above. 2. The appearance the chest is compatible with multilobar pneumonia from  reported COVID infection. Electronically Signed   By: Trudie Reed M.D.   On: 10/15/2019 05:39   DG Chest Port 1 View  Result Date: 10/14/2019 CLINICAL DATA:  COVID-19, ARDS  EXAM: PORTABLE CHEST 1 VIEW COMPARISON:  10/13/2019 FINDINGS: Single frontal view of the chest demonstrates stable position of the endotracheal tube and enteric catheter. There is a right-sided PICC tip overlying superior vena cava. The cardiac silhouette is stable. Interstitial and ground-glass opacities are seen throughout the lungs bilaterally, unchanged. Lung volumes are diminished. No effusion or pneumothorax. IMPRESSION: 1. 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 2. Stable support devices. Electronically Signed   By: Sharlet Salina M.D.   On: 10/14/2019 15:57   DG Abd Portable 1V  Result Date: 10/20/2019 CLINICAL DATA:  GI problem. EXAM: PORTABLE ABDOMEN - 1 VIEW COMPARISON:  10/16/2019 FINDINGS: Feeding tube tip is in the distal stomach or proximal duodenum. Mild gaseous distention of the stomach. Gas throughout nondistended large and small bowel. No evidence of bowel obstruction, organomegaly or free air. IMPRESSION: Feeding tube tip in the distal stomach or proximal duodenum. No evidence of obstruction or free air. Electronically Signed   By: Charlett Nose M.D.   On: 10/20/2019 21:28   DG Abd Portable 1V  Result Date: 10/16/2019 CLINICAL DATA:  Status post OG tube placement EXAM: PORTABLE ABDOMEN - 1 VIEW COMPARISON:  10/16/2019 FINDINGS: The enteric tube tip projects over the distal body of stomach in the side port is below the level of the GE junction. A few air-filled loops of small bowel are noted within the upper abdomen which measure up to 2.8 cm. IMPRESSION: Satisfactory position of enteric tube. Electronically Signed   By: Signa Kell M.D.   On: 10/16/2019 06:52   DG Foot 2 Views Left  Result Date:  10/23/2019 CLINICAL DATA:  Bacteremia, LEFT foot pain and diabetic. EXAM: LEFT FOOT - 2 VIEW COMPARISON:  10/13/2019 and prior radiographs FINDINGS: Mildly angulated fractures of the 4th and 5th metatarsals are again noted with fracture lines still evident. Healing changes along these fractures are noted. Overlying soft tissue swelling is noted. No new fracture, subluxation or dislocation noted. No definite radiographic evidence of acute osteomyelitis noted. IMPRESSION: Mildly angulated healing fractures of the 4th and 5th metatarsals with soft tissue swelling again noted but without definite radiographic evidence of acute osteomyelitis. Consider MRI for further evaluation as clinically indicated. Electronically Signed   By: Harmon Pier M.D.   On: 10/23/2019 13:40   EEG adult  Result Date: 10/27/2019 Charlsie Quest, MD     10/28/2019 10:13 AM Patient Name: OPAL MCKELLIPS MRN: [id number] Epilepsy Attending: Charlsie Quest Referring Physician/Provider: Dr. [name] Date: 10/27/2019 Duration: 22.22 minutes Patient history: 49 year old male with altered mental status. EEG evaluate for seizures. Level of alertness: Awake, asleep AEDs during EEG study: Keppra Technical aspects: This EEG study was done with scalp electrodes positioned according to the 10-20 International system of electrode placement. Electrical activity was acquired at a sampling rate of 500Hz  and reviewed with a high frequency filter of 70Hz  and a low frequency filter of 1Hz . EEG data were recorded continuously and digitally stored. Description: The posterior dominant rhythm consists of 8 Hz activity of moderate voltage (25-35 uV) seen predominantly in posterior head regions, symmetric and reactive to eye opening and eye closing. Sleep was characterized by vertex waves, sleep spindles (12 to 14 Hz), maximal frontocentral region. Frequent runs of sharp waves were seen in frontocentral region, maximal vertex lasting about 5 to 6 seconds  consistent with brief ictal-interictal rhythmic discharges. Continuous generalized 5 to 6 Hz theta slowing was also  noted. Hyperventilation and photic stimulation were not performed.   ABNORMALITY -Continuous slow, generalized -Brief ictal-interictal rhythmic discharges, bilateral frontocentral region, maximal vertex IMPRESSION: This study showed evidence of potential epileptogenicity arising from bilateral frontocentral, maximal vertex region. The sharp waves are at times rhythmic consistent with brief ictal-interictal rhythmic discharges. Additionally, there is evidence of mild diffuse encephalopathy, nonspecific etiology. Priyanka [name]   Overnight EEG with video  Result Date: 10/28/2019 Charlsie Quest, MD     10/29/2019  8:27 AM Patient Name: DOYNE MICKE MRN: [id number] Epilepsy Attending: Charlsie Quest Referring Physician/Provider: Dr. [name] Duration: 10/27/2019 1145 to 10/28/2019 1145  Patient history: 49 year old male with altered mental status. EEG evaluate for seizures.  Level of alertness: Awake, asleep AEDs during EEG study: Keppra  Technical aspects: This EEG study was done with scalp electrodes positioned according to the 10-20 International system of electrode placement. Electrical activity was acquired at a sampling rate of 500Hz  and reviewed with a high frequency filter of 70Hz  and a low frequency filter of 1Hz . EEG data were recorded continuously and digitally stored.  Description: The posterior dominant rhythm consists of 8 Hz activity of moderate voltage (25-35 uV) seen predominantly in posterior head regions, symmetric and reactive to eye opening and eye closing. Sleep was characterized by vertex waves, sleep spindles (12 to 14 Hz), maximal frontocentral region. Initially, frequent runs of sharp waves were seen in frontocentral region, maximal vertex lasting about 5 to 6 seconds consistent with brief ictal-interictal rhythmic discharges. Continuous generalized 5 to 6 Hz  theta slowing as well as intermittent generalized rhythmic 2-3hz  delta slowing  was also noted. Hyperventilation and photic stimulation were not performed.    ABNORMALITY -Continuous slow, generalized - Intermittent rhythmic delta slowing, generalized -Brief ictal-interictal rhythmic discharges, bilateral frontocentral region, maximal vertex  IMPRESSION: This study initially showed evidence of potential epileptogenicity arising from bilateral frontocentral, maximal vertex region. The sharp waves are at times rhythmic consistent with brief ictal-interictal rhythmic discharges. Additionally, there is evidence of mild diffuse encephalopathy, nonspecific etiology. No seizures were seen during thi study. EEG appears to be improving compared to previous day.  Charlsie Quest   ECHO TEE  Result Date: 10/24/2019    TRANSESOPHOGEAL ECHO REPORT   Patient Name:   HUNTER BACHAR Date of Exam: 10/24/2019 Medical Rec #:  [id number]      Height:       74.0 in Accession #:    [id number]     Weight:       247.4 lb Date of Birth:  06-Dec-1970      BSA:          2.379 m Patient Age:    49 years       BP:           120/80 mmHg Patient Gender: M              HR:           100 bpm. Exam Location:  Inpatient Procedure: Transesophageal Echo Indications:    Emobli/Bacteremia  History:        Patient has prior history of Echocardiogram examinations.  Sonographer:    Ross Ludwig RDCS (AE) Referring Phys: [phone] HAO MENG PROCEDURE: The transesophogeal probe was passed without difficulty through the esophogus of the patient. Sedation performed by different physician. The patient's vital signs; including heart rate, blood pressure, and oxygen saturation; remained stable throughout the procedure. The patient developed no complications during the procedure. IMPRESSIONS  1. Left ventricular ejection fraction, by estimation, is 50 to 55%. The left ventricle has low normal function. The left ventricle has no regional wall motion abnormalities.   2. Right ventricular systolic function is normal. The right ventricular size is normal.  3. No left atrial/left atrial appendage thrombus was detected.  4. The mitral valve is normal in structure. Trivial mitral valve regurgitation. No evidence of mitral stenosis.  5. The aortic valve is normal in structure. Aortic valve regurgitation is not visualized. No aortic stenosis is present.  6. The inferior vena cava is normal in size with greater than 50% respiratory variability, suggesting right atrial pressure of 3 mmHg. Conclusion(s)/Recommendation(s): Normal biventricular function without evidence of hemodynamically significant valvular heart disease. FINDINGS  Left Ventricle: Left ventricular ejection fraction, by estimation, is 50 to 55%. The left ventricle has low normal function. The left ventricle has no regional wall motion abnormalities. The left ventricular internal cavity size was normal in size. There is no left ventricular hypertrophy. Right Ventricle: The right ventricular size is normal. No increase in right ventricular wall thickness. Right ventricular systolic function is normal. Left Atrium: Left atrial size was normal in size. No left atrial/left atrial appendage thrombus was detected. Right Atrium: Right atrial size was normal in size. Pericardium: There is no evidence of pericardial effusion. Mitral Valve: The mitral valve is normal in structure. Normal mobility of the mitral valve leaflets. Trivial mitral valve regurgitation. No evidence of mitral valve stenosis. There is no evidence of mitral valve vegetation. Tricuspid Valve: The tricuspid valve is normal in structure. Tricuspid valve regurgitation is mild . No evidence of tricuspid stenosis. There is no evidence of tricuspid valve vegetation. Aortic Valve: The aortic valve is normal in structure. Aortic valve regurgitation is not visualized. No aortic stenosis is present. There is no evidence of aortic valve vegetation. Pulmonic Valve: The  pulmonic valve was normal in structure. Pulmonic valve regurgitation is trivial. No evidence of pulmonic stenosis. Aorta: The aortic root is normal in size and structure. Venous: The inferior vena cava is normal in size with greater than 50% respiratory variability, suggesting right atrial pressure of 3 mmHg. IAS/Shunts: No atrial level shunt detected by color flow Doppler. [name] MD Electronically signed by [name] MD Signature Date/Time: 10/24/2019/3:28:45 PM    Final    VAS US CAROTID (at [facility] and [facility] only)  Result Date: 10/26/2019 Carotid Arterial Duplex Study Indications:       17mm focus of diffusion restriction in the left                    frontoparietal lobe concerning for acute/early subacute                    infarct vs cerebritis. Risk Factors:      Hypertension, Diabetes. Other Factors:     Covid-19, bacteremia. Comparison Study:  No prior study on file Performing Technologist: Sherren Kerns RVS  Examination Guidelines: A complete evaluation includes B-mode imaging, spectral Doppler, color Doppler, and power Doppler as needed of all accessible portions of each vessel. Bilateral testing is considered an integral part of a complete examination. Limited examinations for reoccurring indications may be performed as noted.  Right Carotid Findings: +----------+--------+--------+--------+------------------+--------+           PSV cm/sEDV cm/sStenosisPlaque DescriptionComments +----------+--------+--------+--------+------------------+--------+ CCA Prox  66      11                                         +----------+--------+--------+--------+------------------+--------+  CCA Distal71      18                                         +----------+--------+--------+--------+------------------+--------+ ICA Prox  58      19                                         +----------+--------+--------+--------+------------------+--------+ ICA Distal90      33                                          +----------+--------+--------+--------+------------------+--------+ ECA       114     16                                         +----------+--------+--------+--------+------------------+--------+ +----------+--------+-------+--------+-------------------+           PSV cm/sEDV cmsDescribeArm Pressure (mmHG) +----------+--------+-------+--------+-------------------+ [id number]                                        +----------+--------+-------+--------+-------------------+ +---------+--------+--+--------+--+ VertebralPSV cm/s38EDV cm/s11 +---------+--------+--+--------+--+  Left Carotid Findings: +----------+--------+--------+--------+------------------+--------+           PSV cm/sEDV cm/sStenosisPlaque DescriptionComments +----------+--------+--------+--------+------------------+--------+ CCA Prox  55      14                                         +----------+--------+--------+--------+------------------+--------+ CCA Distal65      20                                         +----------+--------+--------+--------+------------------+--------+ ICA Prox  68      20                                         +----------+--------+--------+--------+------------------+--------+ ICA Distal76      23                                         +----------+--------+--------+--------+------------------+--------+ ECA       77      3                                          +----------+--------+--------+--------+------------------+--------+ +----------+--------+--------+--------+-------------------+           PSV cm/sEDV cm/sDescribeArm Pressure (mmHG) +----------+--------+--------+--------+-------------------+ [id number]                                          +----------+--------+--------+--------+-------------------+ +---------+--------+--+--------+--+  VertebralPSV cm/s63EDV cm/s18 +---------+--------+--+--------+--+    Summary: Right Carotid: The extracranial vessels were near-normal with only minimal wall                thickening or plaque. Left Carotid: The extracranial vessels were near-normal with only minimal wall               thickening or plaque. Vertebrals:  Bilateral vertebral arteries demonstrate antegrade flow. Subclavians: Normal flow hemodynamics were seen in bilateral subclavian              arteries. *See table(s) above for measurements and observations.  Electronically signed by Delia Heady MD on 10/26/2019 at 12:37:48 PM.    Final    VAS [place] LOWER EXTREMITY ARTERIAL DUPLEX  Result Date: 10/26/2019 LOWER EXTREMITY ARTERIAL DUPLEX STUDY Indications: Ulceration, and Osteomyelitis. High Risk Factors: Hypertension, Diabetes. Other Factors: Covid-19. Charcot foot.  Current ABI: N/A Comparison Study: No prior study Performing Technologist: Sherren Kerns RVS  Examination Guidelines: A complete evaluation includes B-mode imaging, spectral Doppler, color Doppler, and power Doppler as needed of all accessible portions of each vessel. Bilateral testing is considered an integral part of a complete examination. Limited examinations for reoccurring indications may be performed as noted.  +----------+--------+-----+--------+---------+--------+ LEFT      PSV cm/sRatioStenosisWaveform Comments +----------+--------+-----+--------+---------+--------+ CFA Prox  123                  triphasic         +----------+--------+-----+--------+---------+--------+ DFA       51                   triphasic         +----------+--------+-----+--------+---------+--------+ SFA Prox  97                   triphasic         +----------+--------+-----+--------+---------+--------+ SFA Mid   88                   triphasic         +----------+--------+-----+--------+---------+--------+ SFA Distal81                   triphasic         +----------+--------+-----+--------+---------+--------+ POP Prox  60                    triphasic         +----------+--------+-----+--------+---------+--------+ POP Distal86                   triphasic         +----------+--------+-----+--------+---------+--------+ ATA Distal110                  triphasic         +----------+--------+-----+--------+---------+--------+ PTA Prox  34                   triphasic         +----------+--------+-----+--------+---------+--------+ PTA Mid   29                   triphasic         +----------+--------+-----+--------+---------+--------+ PTA [id number]                  triphasic         +----------+--------+-----+--------+---------+--------+  Summary: Left: Normal waveforms and adequate flow noted.  See table(s) above for measurements and observations. Electronically signed by Lemar Livings MD on 10/26/2019 at 4:50:14 PM.  Final    VAS [place] LOWER EXTREMITY ARTERIAL DUPLEX  Result Date: 10/19/2019 LOWER EXTREMITY ARTERIAL DUPLEX STUDY Indications: Cold leg, Covid-19.  Current ABI: N/A Comparison Study: No prior study Performing Technologist: Sherren Kerns RVS  Examination Guidelines: A complete evaluation includes B-mode imaging, spectral Doppler, color Doppler, and power Doppler as needed of all accessible portions of each vessel. Bilateral testing is considered an integral part of a complete examination. Limited examinations for reoccurring indications may be performed as noted.  +-----------+--------+-----+--------+---------+--------+ RIGHT      PSV cm/sRatioStenosisWaveform Comments +-----------+--------+-----+--------+---------+--------+ CFA Prox   67                   triphasic         +-----------+--------+-----+--------+---------+--------+ DFA        60                   triphasic         +-----------+--------+-----+--------+---------+--------+ SFA Prox   68                   triphasic         +-----------+--------+-----+--------+---------+--------+ SFA Mid    62                    triphasic         +-----------+--------+-----+--------+---------+--------+ SFA Distal 69                   triphasic         +-----------+--------+-----+--------+---------+--------+ POP Prox   47                   triphasic         +-----------+--------+-----+--------+---------+--------+ POP Distal 42                   triphasic         +-----------+--------+-----+--------+---------+--------+ ATA Distal 15                   triphasic         +-----------+--------+-----+--------+---------+--------+ PTA Prox   31                   triphasic         +-----------+--------+-----+--------+---------+--------+ PTA Mid    38                   triphasic         +-----------+--------+-----+--------+---------+--------+ PTA Distal 38                   triphasic         +-----------+--------+-----+--------+---------+--------+ PERO Distal36                   triphasic         +-----------+--------+-----+--------+---------+--------+  Summary: Right: Normal examination. No evidence of arterial occlusive disease. Normal waveforms, no stenosis.  See table(s) above for measurements and observations. Electronically signed by Sherald Hess MD on 10/19/2019 at 3:25:02 PM.    Final    VAS [place] LOWER EXTREMITY VENOUS (DVT)  Result Date: 10/26/2019  Lower Venous DVTStudy Indications: Covid-19, elevated D-Dimer.  Comparison Study: Prior negative left lower extremity venous duplex don 03/19/17                   and is available for comparison. Performing Technologist: Sherren Kerns RVS  Examination Guidelines: A complete evaluation includes B-mode imaging, spectral Doppler, color Doppler, and  power Doppler as needed of all accessible portions of each vessel. Bilateral testing is considered an integral part of a complete examination. Limited examinations for reoccurring indications may be performed as noted. The reflux portion of the exam is performed with the patient in reverse  Trendelenburg.  +---------+---------------+---------+-----------+----------+--------------+ RIGHT    CompressibilityPhasicitySpontaneityPropertiesThrombus Aging +---------+---------------+---------+-----------+----------+--------------+ CFV      Full           Yes      Yes                                 +---------+---------------+---------+-----------+----------+--------------+ SFJ      Full                                                        +---------+---------------+---------+-----------+----------+--------------+ FV Prox  Full                                                        +---------+---------------+---------+-----------+----------+--------------+ FV Mid   Full                                                        +---------+---------------+---------+-----------+----------+--------------+ FV DistalFull                                                        +---------+---------------+---------+-----------+----------+--------------+ PFV      Full                                                        +---------+---------------+---------+-----------+----------+--------------+ POP      Partial        Yes      Yes                  Acute          +---------+---------------+---------+-----------+----------+--------------+ PTV      Full                                                        +---------+---------------+---------+-----------+----------+--------------+ PERO     Full                                                        +---------+---------------+---------+-----------+----------+--------------+   +---------+---------------+---------+-----------+----------+--------------+ LEFT  CompressibilityPhasicitySpontaneityPropertiesThrombus Aging +---------+---------------+---------+-----------+----------+--------------+ CFV      Full           Yes      Yes                                  +---------+---------------+---------+-----------+----------+--------------+ SFJ      Full                                                        +---------+---------------+---------+-----------+----------+--------------+ FV Prox  Full                                                        +---------+---------------+---------+-----------+----------+--------------+ FV Mid   Full                                                        +---------+---------------+---------+-----------+----------+--------------+ FV DistalFull                                                        +---------+---------------+---------+-----------+----------+--------------+ PFV      Full                                                        +---------+---------------+---------+-----------+----------+--------------+ POP      Full           Yes      Yes                                 +---------+---------------+---------+-----------+----------+--------------+ PTV      Full                                                        +---------+---------------+---------+-----------+----------+--------------+ PERO     Full                                                        +---------+---------------+---------+-----------+----------+--------------+     Summary: RIGHT: - Findings consistent with acute deep vein thrombosis involving the right popliteal vein. - Ultrasound characteristics of enlarged lymph nodes are noted in the groin.  LEFT: - There is no evidence of deep vein thrombosis in the lower extremity.  *See table(s) above for measurements  and observations. Electronically signed by Lemar Livings MD on 10/26/2019 at 4:50:27 PM.    Final

## 2019-11-13 NOTE — Progress Notes (Signed)
Physical Therapy Treatment Patient Details Name: Micheal Bell MRN: [id number] DOB: 1970-10-29 Today's Date: 11/13/2019    History of Present Illness Pt is a 49 y.o. male admitted 10/09/19 for COVID PNA. Pt decompensated 8/19 and intubated; self extubated 8/23 and reintubated; extubated 8/28. Course complicated by MSSA and GBS bacteria, sepsis, systolic HF, acute metabolic encephalopathy. 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 TEE without evidence of vegitation. 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    PT Comments    Patient received in bed, agreeable to PT session. He is motivated to walk. +2 present for safety with chair follow, but was not needed. Patient performed bed mobility with min assist to raise trunk to seated position. Sit to stand with min guard from very elevated height. He ambulated 250 feet with RW and min guard. Demonstrates R LE instability, but was able to manage it well with B UE support on walker. No lob. Patient required 1-2 brief standing rest breaks due to fatigue. He is slightly unaware of limitations at times and suggests he can probable walk without walker. ( not attempted). He will continue to benefit from skilled PT while here to improve safety, strength and functional independence.       Follow Up Recommendations  Home health PT;Supervision for mobility/OOB;Supervision - Intermittent     Equipment Recommendations  None recommended by PT;Other (comment) (if he has RW at home)    Recommendations for Other Services       Precautions / Restrictions Precautions Precautions: Fall Precaution Comments: Charcot foot LLE with wound Required Braces or Orthoses: Other Brace Restrictions Weight  Bearing Restrictions: No    Mobility  Bed Mobility Overal bed mobility: Needs Assistance Bed Mobility: Supine to Sit     Supine to sit: Min assist     General bed mobility comments: left sitting up on side of bed with lunch tray. He did not want to sit in recliner  Transfers Overall transfer level: Needs assistance Equipment used: Rolling walker (2 wheeled) Transfers: Sit to/from Stand Sit to Stand: From elevated surface;Min guard;Supervision         General transfer comment: Min gaurd/supervision, but likes bed to be VERY elevated prior to standing. States his bed at home is very high.  Ambulation/Gait Ambulation/Gait assistance: Min guard Gait Distance (Feet): 250 Feet Assistive device: Rolling walker (2 wheeled) Gait Pattern/deviations: Step-through pattern;Decreased step length - right;Decreased step length - left;Decreased stride length;Decreased weight shift to right;Trunk flexed;Narrow base of support Gait velocity: reduced   General Gait Details: 1 episode of signifcant Knee buckling on right, but able to control using walker. Otherwise mild hyperextension of right knee, slight instability at times. Patient able to manage well. Chair to follow this visit, but was not needed.   Stairs             Wheelchair Mobility    Modified Rankin (Stroke Patients Only) Modified Rankin (Stroke Patients Only) Pre-Morbid Rankin Score: No symptoms Modified Rankin: Moderate disability     Balance Overall balance assessment: Needs assistance Sitting-balance support: Feet supported Sitting balance-Leahy Scale: Good     Standing balance support: Bilateral upper extremity supported;During functional activity Standing balance-Leahy Scale: Fair Standing balance comment: reliant on BUE support  Cognition Arousal/Alertness: Awake/alert Behavior During Therapy: WFL for tasks assessed/performed Overall Cognitive Status: No  family/caregiver present to determine baseline cognitive functioning Area of Impairment: Problem solving;Awareness;Safety/judgement                 Orientation Level: Disoriented to;Situation Current Attention Level: Selective Memory: Decreased recall of precautions;Decreased short-term memory Following Commands: Follows one step commands consistently Safety/Judgement: Decreased awareness of safety;Decreased awareness of deficits Awareness: Intellectual Problem Solving: Slow processing;Requires verbal cues;Requires tactile cues General Comments: Patient did ask to walk without walker, or felt like he could. Did not attempt. No recall of wearing CAM boot for ambulation.      Exercises      General Comments        Pertinent Vitals/Pain Pain Assessment: No/denies pain    Home Living                      Prior Function            PT Goals (current goals can now be found in the care plan section) Acute Rehab PT Goals Patient Stated Goal: get home with his family PT Goal Formulation: With patient Time For Goal Achievement: 11/18/19 Potential to Achieve Goals: Good Progress towards PT goals: Progressing toward goals    Frequency    Min 4X/week      PT Plan Discharge plan needs to be updated    Co-evaluation              AM-PAC PT "6 Clicks" Mobility   Outcome Measure  Help needed turning from your back to your side while in a flat bed without using bedrails?: None Help needed moving from lying on your back to sitting on the side of a flat bed without using bedrails?: A Little Help needed moving to and from a bed to a chair (including a wheelchair)?: A Little Help needed standing up from a chair using your arms (e.g., wheelchair or bedside chair)?: A Little Help needed to walk in hospital room?: A Little Help needed climbing 3-5 steps with a railing? : A Little 6 Click Score: 19    End of Session Equipment Utilized During Treatment: Gait  belt Activity Tolerance: Patient tolerated treatment well Patient left: with call bell/phone within reach;in bed;Other (comment) (seated on side of bed to eat lunch) Nurse Communication: Mobility status PT Visit Diagnosis: Other abnormalities of gait and mobility (R26.89);Muscle weakness (generalized) (M62.81);Other symptoms and signs involving the nervous system (R29.898)     Time: 1610-9604 PT Time Calculation (min) (ACUTE ONLY): 21 min  Charges:  $Gait Training: 8-22 mins                     Dmarcus Decicco, PT, GCS 11/13/19,2:24 PM

## 2019-11-13 NOTE — TOC Progression Note (Addendum)
Transition of Care [facility]) - Progression Note    Patient Details  Name: Micheal Bell MRN: [id number] Date of Birth: 1970-09-07  Transition [facility]) CM/SW Contact  Doy Hutching, [place] Phone Number: 11/13/2019, 11:18 AM  Clinical Narrative:  1:50pm- [facility] requesting additional clinical from OT.     11:18am- CSW spoke with Jace at [facility]. He is requesting additional clinicals be sent to [phone].    Expected Discharge Plan: Skilled Nursing Facility Barriers to Discharge: Continued Medical Work up  Expected Discharge Plan and Services Expected Discharge Plan: Skilled Nursing Facility In-house Referral: Clinical Social Work Discharge Planning Services: CM Consult  Readmission Risk Interventions No flowsheet data found.

## 2019-11-14 LAB — BASIC METABOLIC PANEL
Anion gap: 12 (ref 5–15)
BUN: 6 mg/dL (ref 6–20)
CO2: 25 mmol/L (ref 22–32)
Calcium: 8.7 mg/dL — ABNORMAL LOW (ref 8.9–10.3)
Chloride: 100 mmol/L (ref 98–111)
Creatinine, Ser: 0.7 mg/dL (ref 0.61–1.24)
GFR calc Af Amer: >60 mL/min (ref >60)
GFR calc non Af Amer: >60 mL/min (ref >60)
Glucose, Bld: 134 mg/dL — ABNORMAL HIGH (ref 70–99)
Potassium: 3.6 mmol/L (ref 3.5–5.1)
Sodium: 137 mmol/L (ref 135–145)

## 2019-11-14 LAB — MAGNESIUM: Magnesium: 1.8 mg/dL (ref 1.7–2.4)

## 2019-11-14 LAB — MAGNESIUM, URINE, 24 HOUR
Magnesium, urine: 7.6 mg/dL
Magnesium,Urine 24hr: 250.8 mg/24 hr — ABNORMAL HIGH (ref 12.0–293.0)
Total Volume: 3300

## 2019-11-14 LAB — GLUCOSE, CAPILLARY
Glucose-Capillary: 142 mg/dL — ABNORMAL HIGH (ref 70–99)
Glucose-Capillary: 178 mg/dL — ABNORMAL HIGH (ref 70–99)
Glucose-Capillary: 128 mg/dL — ABNORMAL HIGH (ref 70–99)
Glucose-Capillary: 187 mg/dL — ABNORMAL HIGH (ref 70–99)

## 2019-11-14 NOTE — Progress Notes (Signed)
Physical Therapy Treatment Patient Details Name: KARLTON MAYA MRN: [id number] DOB: [birth year] Today's Date: 11/14/2019    History of Present Illness Pt is a 49 y.o. male admitted 10/09/19 for COVID PNA. Pt decompensated 8/19 and intubated; self extubated 8/23 and reintubated; extubated 8/28. Course complicated by MSSA and GBS bacteria, sepsis, systolic HF, acute metabolic encephalopathy. 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 TEE without evidence of vegitation. 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    PT Comments    Patient received in bed, just finishing lunch, agrees to PT session. He reports he is feeling pretty good. Requires min assist for supine to sit. Sit to stand from elevated surface with min guard. He is able to ambulate 250 feet with RW and min guard. 2 brief standing rest breaks needed due to fatigue. He will continue to benefit from skilled PT while here to improve his strength, safety and functional independence.     Follow Up Recommendations  Home health PT;Supervision for mobility/OOB;Supervision - Intermittent     Equipment Recommendations  None recommended by PT    Recommendations for Other Services       Precautions / Restrictions Precautions Precaution Comments: Mod fall Restrictions Weight Bearing Restrictions: No    Mobility  Bed Mobility Overal bed mobility: Needs Assistance Bed Mobility: Supine to Sit;Sit to Supine     Supine to sit: Min assist Sit to supine: Modified independent (Device/Increase time)      Transfers Overall transfer level: Needs assistance Equipment used: Rolling walker (2 wheeled) Transfers: Sit to/from Stand   Stand pivot transfers: From elevated  surface;Supervision       General transfer comment: Patient able to stand from bed if very elevated. Due to B shoulder limitations and LE weakness.  Ambulation/Gait Ambulation/Gait assistance: Min guard Gait Distance (Feet): 250 Feet Assistive device: Rolling walker (2 wheeled) Gait Pattern/deviations: Step-through pattern Gait velocity: reduced   General Gait Details: No significant knee buckling this date. Fatigued with gait requiring 2 brief standing rest breaks.   Stairs             Wheelchair Mobility    Modified Rankin (Stroke Patients Only) Modified Rankin (Stroke Patients Only) Pre-Morbid Rankin Score: No symptoms Modified Rankin: Slight disability     Balance Overall balance assessment: Needs assistance Sitting-balance support: Feet supported Sitting balance-Leahy Scale: Good Sitting balance - Comments: supervision only   Standing balance support: Bilateral upper extremity supported;During functional activity Standing balance-Leahy Scale: Fair Standing balance comment: reliant on BUE support                            Cognition Arousal/Alertness: Awake/alert Behavior During Therapy: WFL for tasks assessed/performed Overall Cognitive Status: Within Functional Limits for tasks assessed                                        Exercises Other Exercises Other Exercises: Standing marching, heel raises x 5 reps each. LAQ x 5 reps on right.    General Comments        Pertinent Vitals/Pain Pain Assessment: Faces Faces Pain Scale: Hurts little more Pain Location: shoulders Pain Descriptors /  Indicators: Aching;Discomfort Pain Intervention(s): Repositioned    Home Living                      Prior Function            PT Goals (current goals can now be found in the care plan section) Acute Rehab PT Goals Patient Stated Goal: get home with his family PT Goal Formulation: With patient Time For Goal Achievement:  11/18/19 Potential to Achieve Goals: Good Progress towards PT goals: Progressing toward goals    Frequency    Min 4X/week      PT Plan Current plan remains appropriate    Co-evaluation              AM-PAC PT "6 Clicks" Mobility   Outcome Measure  Help needed turning from your back to your side while in a flat bed without using bedrails?: None Help needed moving from lying on your back to sitting on the side of a flat bed without using bedrails?: A Little Help needed moving to and from a bed to a chair (including a wheelchair)?: A Little Help needed standing up from a chair using your arms (e.g., wheelchair or bedside chair)?: A Little Help needed to walk in hospital room?: A Little Help needed climbing 3-5 steps with a railing? : A Little 6 Click Score: 19    End of Session Equipment Utilized During Treatment: Gait belt Activity Tolerance: Patient tolerated treatment well Patient left: in bed;with call bell/phone within reach Nurse Communication: Mobility status PT Visit Diagnosis: Muscle weakness (generalized) (M62.81);Other symptoms and signs involving the nervous system (R29.898);Difficulty in walking, not elsewhere classified (R26.2)     Time: 4128-7867 PT Time Calculation (min) (ACUTE ONLY): 35 min  Charges:  $Gait Training: 23-37 mins                     Aslynn Brunetti, PT, GCS 11/14/19,1:35 PM

## 2019-11-14 NOTE — Care Management (Signed)
    Durable Medical Equipment  (From admission, onward)         Start     Ordered   11/14/19 1303  For home use only DME Walker rolling  Once       Question Answer Comment  Walker: With 5 Inch Wheels   Patient needs a walker to treat with the following condition Weakness      11/14/19 1303   11/14/19 1302  For home use only DME Hospital bed  Once       Comments: Call Nancy Smith [phone], today for delivery thanks  Question Answer Comment  Length of Need 6 Months   Patient has (list medical condition): Acute Hypoxic Resp. Failure due to Acute Covid 19 Viral Pneumonitis during the ongoing 2020 Covid 19 Pandemic   The above medical condition requires: Patient requires the ability to reposition frequently   Head must be elevated greater than: 45 degrees   Bed type Semi-electric   Support Surface: Gel Overlay      11/14/19 1302

## 2019-11-14 NOTE — Progress Notes (Signed)
Nutrition Follow-up  RD working remotely.  DOCUMENTATION CODES:   Not applicable  INTERVENTION:   -D/c Ensure Max po daily, each supplement provides 150 kcal and 30 grams of protein -Continue MVI with minerals daily -Continue Magic cup TID with meals, each supplement provides 290 kcal and 9 grams of protein  NUTRITION DIAGNOSIS:   Inadequate oral intake related to acute illness as evidenced by NPO status.  Progressing; advanced to heart healthy/ carb modified diet on 10/25/19  GOAL:   Patient will meet greater than or equal to 90% of their needs  Progressing   MONITOR:   PO intake, Supplement acceptance, Labs, Weight trends, Skin, I & O's  REASON FOR ASSESSMENT:   Ventilator    ASSESSMENT:   50 yo male admitted with COVID 19 pneumonia requiring intubation. PMH includes HTN, gout, DM, CHF, IBS  8/16 Admitted to [facility] 8/19 Transferred to [facility], Intubated 8/25 Cortrak placed, gastric 8/28 Extubated 8/31 Cortrak dislodged during TEE 9/01 Diet advanced   Reviewed I/O's: -394 ml x 24 hours and -14.7 L since 10/31/19  UOP: 2.3 L x 24 hours  Pt remains with good appetite; noted meal completion 100%. Pt is refusing Ensure Max supplements.   Per TOC notes, pt is progressing with therapies. Now recommending outpatient therapy.   Medications reviewed and include colace, lasix, magnesium oxide, and miralax.  Labs reviewed: CBGS: 142-189 (inpatient orders for glycemic control are 0-15 units insulin aspart TID with meals and 20 units insulin glargine daily).   Diet Order:   Diet Order            Diet heart healthy/carb modified Room service appropriate? No; Fluid consistency: Thin  Diet effective now                 EDUCATION NEEDS:   Not appropriate for education at this time  Skin:  Skin Assessment: Reviewed RN Assessment Skin Integrity Issues:: Diabetic Ulcer Diabetic Ulcer: L. Foot after stepping on nail 2-3 weeks ago Other: wound to L. Foot after stepping on  nail 2-3 weeks ago  Last BM:  11/14/19  Height:   Ht Readings from Last 1 Encounters:  10/24/19 6\' 2"  (1.88 m)    Weight:   Wt Readings from Last 1 Encounters:  11/13/19 115.4 kg   BMI:  Body mass index is 32.66 kg/m.  Estimated Nutritional Needs:   Kcal:  2200-2400  Protein:  130-145 grams  Fluid:  > 2 L    11/15/19, RD, LDN, CDCES Registered Dietitian II Certified Diabetes Care and Education Specialist Please refer to [facility] for RD and/or RD on-call/weekend/after hours pager

## 2019-11-14 NOTE — TOC Progression Note (Addendum)
Transition of Care [facility]) - Progression Note    Patient Details  Name: Micheal Bell MRN: [id number] Date of Birth: 1970/05/16  Transition of Care [facility]) CM/SW Contact  Micheal Bell Adria Devon, RN Phone Number: 11/14/2019, 1:07 PM  Clinical Narrative:     Patient progressing with PT. PT recommendations now for HHPT.   Prior to hospitalization patient lived in a camper behind his mother's home. At discharge he plans to stay with his mother. Confirmed face sheet address and phone number and his mother's phone number.    Discussed recommendation for home health PT.   NCM called every home health agency listed for his address and none are able to accept due to insurance/ staffing.   Discussed OP PT with patient. Patient in agreement and has transportation. Listed OP PT centers patient would like to go to [facility]. MD aware . Order placed.  Patient also requesting a hospital bed and walker for home. Discussed with MD , same ordered. Patient wants [employer] to call his mother [name] [phone] to arrange delivery . Spoke to [place] , requested delivery today for DC tomorrow. Velna Hatchet stated delivery could be made first thing in am.    Patient requesting a recliner lift chair. NCM explained he /family would have to go to a DME store and purchase one. Patient voiced understanding   Left message for Jace at [facility] Inpatient rehab   Expected Discharge Plan: Home w Home Health Services Barriers to Discharge: Insurance Authorization  Expected Discharge Plan and Services Expected Discharge Plan: Home w Home Health Services In-house Referral: Clinical Social Work Discharge Planning Services: CM Consult Post Acute Care Choice: Home Health, Durable Medical Equipment Living arrangements for the past 2 months: Single Family Home                 DME Arranged: Hospital bed, Walker rolling DME Agency: [employer] Date DME Agency Contacted: 11/14/19 Time DME Agency Contacted: [phone] Representative spoke with  at DME Agency: Francesco Sor Arranged:  (unable to find an agency to accept)           Social Determinants of Health (SDOH) Interventions    Readmission Risk Interventions No flowsheet data found.

## 2019-11-14 NOTE — Progress Notes (Signed)
PROGRESS NOTE                                                                                                                                                                                                             Patient Demographics:    Micheal Bell, is a 49 y.o. male, DOB - 1970-10-17, ZOX:[id number]  Outpatient Primary MD for the patient is [name], MD    LOS - 33  Admit date - 10/09/2019    Chief Complaint  Patient presents with  . Cough       Brief Narrative - 49 yo male with hx of HTN, T2DM, gout, IBS, fibromyalgia, obesity who presented on 8/16 for COVID pneumonia.  He decompensated on 8/19 and was intubated.  Self extubated on 8/23 and was reintubated.  He's subsequently been extubated on 8/28.  He's been on typical therapies for covid including steroids, baricitinib and remdesivir.  His hospitalization was complicated by MSSA and GBS bacteria.  Also found to have systolic heart failure.    Subjective:   No acute issues or events overnight, denies headache, fever, chills, chest pain, shortness of breath, nausea, vomiting, diarrhea, constipation.   Assessment  & Plan :   Sepsis with MSSA and Group B strep bacteremia due to left foot abscess.  - ID following, was on IV Ancef but switch to nafcillin on 10/26/2019.   - Clean TEE, history of Charcot joints, peripheral neuropathy and recent left foot soft tissue injury, MRI left foot shows an abscess - Ortho was consulted and they recommended medical management with outpatient follow-up with Dr. [name] post discharge, also has osteomyelitis, defer duration of treatment with antibiotics to ID.   - Antibiotics management per ID, plan to continue total of 4 weeks of IV nafcillin, through 9/29, then start Keflex 500 mg 4 times daily, he has a follow-up arranged with Dr. [name] on October 13 at 10:30 AM .  Acute Hypoxic Resp. Failure due to Acute Covid 19 Viral  Pneumonitis during the ongoing 2020 Covid 19 Pandemic, RESOLVED - he had severe hypoxia and parenchymal lung injury he was intubated and admitted by ICU - Completed IV steroids, remdesivir and Baricitinib (started on 10/12/2019).   - Extubated on 10/21/2019.  - His stay was complicated by bacteremia after which Baricitinib was discontinued on 10/24/2019.  - His pulmonary  status is gradually improving, currently he is stable and symptom-free, he is on room air, I have discussed with him about incentive spirometry, flutter valve, pulmonary toiletry, he is now off of isolation and his pulmonary issues from COVID-19 is clinically resolved.  Mild dry cough on 11/08/2019.  X-ray with mild fluid overload, likely acute on chronic diastolic CHF, improved trial of Lasix and monitor.  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  For seizures he has been started on Keppra with good effect and his mentation has improved considerably. Continue PT OT and speech, SNF VS CIR .  Neuro has seen the patient this admission.  Possible systolic heart failure as shown by TTE however this could have been transient due to sepsis, on repeat TEE his EF is now normalized to 50 to 55%.  He is currently compensated, blood pressure was too low and after hydration has improved, continue beta-blocker.   Cyanotic right lower extremity toes.  Likely due to hypoperfusion which is transient, arterial ultrasound stable. Resolved.   History of chronic pain.   On multiple narcotics, Flexeril  and benzodiazepines.  Was off of these medications for several days without any discomfort Reintroduced on his request at very low doses.  Do not escalate - he continues to ask for advancement despite our discussion that he needs to wean off these if at all possible.  HTN  BP was low on multiple blood pressure medications, was hydrated now able to tolerate low-dose beta-blocker.  Acute DVT in the right lower extremity.   Eliquis started on 11/11/2019.   Hypomagnesemia.   Has been aggressively replaced with IV and is on scheduled oral magnesium, PPI has been stopped, on lower home dose Lasix only which was actually started on 11/13/2019  DM type II, severely uncontrolled.  Currently on Lantus and sliding scale; monitor for hypoglycemia. Lab Results  Component Value Date   HGBA1C 11.3 (H) 10/26/2019   CBG (last 3)  Recent Labs    11/13/19 1649 11/13/19 2112 11/14/19 0742  GLUCAP 158* 189* 142*    Condition -   Guarded Family Communication  :  Sister and mother [phone] - 10/25/19, 10/27/19, 10/28/19, 11/03/19, 11/11/19: Patient updated family himself 9/21 while in the room Code Status :  Full Consults  :  PCCM, ID, Cards Procedures:  EEG - This study showed evidence of potential epileptogenicity arising from bilateral frontocentral, maximal vertex region. The sharp waves are at times rhythmic consistent with brief ictal-interictal rhythmic discharges. Additionally, there is evidence of mild diffuse encephalopathy, nonspecific etiology.  Bilateral lower extremity venous ultrasound - Acute right lower extremity popliteal vein DVT.  Carotid duplex.  Nonacute.    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  CT Head -  1. No acute intracranial abnormality. 2. Area of subcortical low-density involving the posterior left frontal lobe at the convexity, likely encephalomalacia and sequela of remote  infarct. There are no prior exams for comparison to establish chronicity.  MRI could be considered for further evaluation based on clinical concern. 3. Paranasal sinus fluid levels on opacification of the right greater than left mastoid air cells, possibly related to intubation.  R. Leg Arterial US -  No evidence of arterial occlusive disease. Normal waveforms, no stenosis  TTE -  1. Extremely poor acoustic windows. LVEF appears to be depressed with diffuse hypokinesis, worse in the mid/distal inferior/inferoseptal walls. . Left ventricular ejection fraction, by estimation, is 30 to 35%. The left ventricle has moderately decreased function. The left ventricular internal cavity size was mildly dilated. Left ventricular diastolic parameters are indeterminate.  2. Right ventricular systolic function is moderately reduced. The right ventricular size is mildly enlarged.  3. The mitral valve is normal in structure. Trivial mitral valve regurgitation.  4. The aortic valve is normal in structure. Aortic valve regurgitation is not visualized  TEE - 1. Left ventricular ejection fraction, by estimation, is 50 to 55%. The left ventricle has low normal function. The left ventricle has no regional wall motion abnormalities.  2. Right ventricular systolic function is normal. The right ventricular size is normal.  3. No left atrial/left atrial appendage thrombus was detected.  4. The mitral valve is normal in structure. Trivial mitral valve regurgitation. No evidence of mitral stenosis.  5. The aortic valve is normal in structure. Aortic valve regurgitation is not visualized. No aortic stenosis is present.  6. The inferior vena cava is normal in size with greater than 50% respiratory variability, suggesting right atrial pressure of 3 mmHg.  CT - 1. No acute intracranial abnormality. 2. Area of subcortical low-density involving the posterior left frontal lobe at the convexity, likely encephalomalacia and sequela of remote  infarct. There are no prior exams for comparison to establish chronicity. MRI could be considered for further evaluation based on clinical concern. 3. Paranasal sinus fluid levels on opacification of the right greater than left mastoid air cells, possibly related to intubation   Disposition Plan: Status is: Inpatient  Remains inpatient appropriate because:IV treatments appropriate due to intensity of illness or inability to take PO   Dispo: The patient is from: Home              Anticipated d/c is to: Home              Anticipated d/c date is: 1 day              Patient currently is medically stable to d/c. to CIR when bed is available.   DVT Prophylaxis  :  Eliquis  Lab Results  Component Value Date   PLT 297 11/08/2019    Diet :  Diet Order            Diet heart healthy/carb modified Room service appropriate? No; Fluid consistency: Thin  Diet effective now                  Inpatient Medications  Scheduled Meds: . apixaban  5 mg Oral BID  . atorvastatin  40 mg Oral Daily  . buPROPion  150 mg Oral Daily  . Chlorhexidine Gluconate Cloth  6 each Topical Daily  . docusate sodium  100 mg Oral BID  . furosemide  20 mg Oral Daily  . insulin aspart  0-15 Units Subcutaneous TID WC  . insulin glargine  20 Units Subcutaneous Daily  . levETIRAcetam  500 mg Oral BID  . magnesium oxide  800 mg Oral BID  . melatonin  3 mg Oral QHS  .  metoprolol tartrate  75 mg Oral BID  . multivitamin with minerals  1 tablet Oral Daily  . nortriptyline  25 mg Oral QHS  . polyethylene glycol  17 g Oral Daily  . Ensure Max Protein  11 oz Oral QHS  . senna  2 tablet Oral BID  . sodium chloride flush  10-40 mL Intracatheter Q12H   Continuous Infusions: . sodium chloride 20.8 mL/hr at 11/13/19 2010  . nafcillin (NAFCIL) continuous infusion 20.8 mL/hr at 11/14/19 0630   PRN Meds:.sodium chloride, acetaminophen, bisacodyl, cyclobenzaprine, HYDROcodone-acetaminophen, Ipratropium-Albuterol,  menthol-cetylpyridinium, zolpidem  Antibiotics  :    Anti-infectives (From admission, onward)   Start     Dose/Rate Route Frequency Ordered Stop   10/31/19 1415  nafcillin 12 g in sodium chloride 0.9 % 500 mL continuous infusion  Status:  Discontinued        12 g 20.8 mL/hr over 24 Hours Intravenous Every 24 hours 10/31/19 0608 10/31/19 0609   10/29/19 1415  nafcillin 12 g in dextrose 5 % 500 mL IVPB  Status:  Discontinued        12 g 20.8 mL/hr over 24 Hours Intravenous Every 24 hours 10/29/19 0434 10/29/19 0507   10/29/19 1415  nafcillin 12 g in sodium chloride 0.9 % 500 mL continuous infusion        12 g 20.8 mL/hr over 24 Hours Intravenous Every 24 hours 10/29/19 0507 11/23/19 1959   10/26/19 1415  nafcillin 12 g in dextrose 5 % 500 mL IVPB  Status:  Discontinued        12 g 20.8 mL/hr over 24 Hours Intravenous Every 24 hours 10/26/19 1414 10/29/19 0434   10/26/19 1400  nafcillin injection 12 g  Status:  Discontinued        12 g Intravenous Daily 10/26/19 1238 10/26/19 1242   10/26/19 1400  nafcillin 12 g in dextrose 5 % 50 mL IVPB  Status:  Discontinued        12 g 100 mL/hr over 30 Minutes Intravenous Every 24 hours 10/26/19 1242 10/26/19 1339   10/26/19 1400  nafcillin 12 g in dextrose 5 % 500 mL IVPB  Status:  Discontinued        12 g 20.8 mL/hr over 24 Hours Intravenous Every 24 hours 10/26/19 1337 10/26/19 1416   10/26/19 1200  nafcillin 2 g in sodium chloride 0.9 % 100 mL IVPB  Status:  Discontinued        2 g 200 mL/hr over 30 Minutes Intravenous Every 4 hours 10/26/19 1010 10/26/19 1238   10/12/19 2200  cefTRIAXone (ROCEPHIN) 2 g in sodium chloride 0.9 % 100 mL IVPB  Status:  Discontinued        2 g 200 mL/hr over 30 Minutes Intravenous Every 24 hours 10/12/19 0441 10/12/19 0921   10/12/19 0930  ceFAZolin (ANCEF) IVPB 2g/100 mL premix  Status:  Discontinued        2 g 200 mL/hr over 30 Minutes Intravenous Every 8 hours 10/12/19 0921 10/26/19 1010   10/11/19 1000   remdesivir 100 mg in sodium chloride 0.9 % 100 mL IVPB       "Followed by" Linked Group Details   100 mg 200 mL/hr over 30 Minutes Intravenous Daily 10/10/19 0050 10/14/19 0956   10/11/19 0800  vancomycin (VANCOCIN) IVPB 1000 mg/200 mL premix  Status:  Discontinued        1,000 mg 200 mL/hr over 60 Minutes Intravenous Every 8 hours 10/10/19 2239 10/12/19 1610  10/10/19 2230  vancomycin (VANCOCIN) IVPB 1000 mg/200 mL premix        1,000 mg 200 mL/hr over 60 Minutes Intravenous Every 1 hr x 2 10/10/19 2208 10/11/19 0052   10/10/19 2215  cefTRIAXone (ROCEPHIN) 2 g in sodium chloride 0.9 % 100 mL IVPB  Status:  Discontinued        2 g 200 mL/hr over 30 Minutes Intravenous Every 24 hours 10/10/19 2205 10/12/19 0441   10/10/19 0100  remdesivir 100 mg in sodium chloride 0.9 % 100 mL IVPB       "Followed by" Linked Group Details   100 mg 200 mL/hr over 30 Minutes Intravenous Every 30 min 10/10/19 0050 10/10/19 0332        Azucena Fallen M.D on 11/14/2019 at 8:13 AM  To page go to [email or web address] -  [facility] -  Office  [phone]   See all Orders from today for further details    Objective:   Vitals:   11/13/19 0500 11/13/19 1303 11/13/19 2111 11/14/19 0456  BP:  124/80 (!) 92/53 130/82  Pulse:  88 88 90  Resp:  16 18 15   Temp:  98.1 F (36.7 C) 98.5 F (36.9 C) 98.3 F (36.8 C)  TempSrc:  Oral Oral Oral  SpO2:  98% 93% 95%  Weight: 115.4 kg     Height:        Wt Readings from Last 3 Encounters:  11/13/19 115.4 kg  09/11/19 111.1 kg  05/12/17 (!) 145.2 kg     Intake/Output Summary (Last 24 hours) at 11/14/2019 0813 Last data filed at 11/14/2019 0630 Gross per 24 hour  Intake 1880.71 ml  Output 2275 ml  Net -394.29 ml     Physical Exam  Awake Alert, No new F.N deficits, chronic bilateral rotator cuff injury induced bilateral upper extremity weakness in the deltoid area, per patient this is old and unchanged,  chronic foot wounds on both feet under  bandage, right arm PICC line in place. Adrian.AT,PERRL Supple Neck,No JVD, No cervical lymphadenopathy appriciated.  Symmetrical Chest wall movement, Good air movement bilaterally, CTAB RRR,No Gallops, Rubs or new Murmurs, No Parasternal Heave +ve B.Sounds, Abd Soft, No tenderness, No organomegaly appriciated, No rebound - guarding or rigidity. No Cyanosis    Data Review:    CBC Recent Labs  Lab 11/08/19 0450  WBC 7.7  HGB 9.3*  HCT 29.2*  PLT 297  MCV 92.1  MCH 29.3  MCHC 31.8  RDW 13.5  LYMPHSABS 2.4  MONOABS 0.7  EOSABS 0.3  BASOSABS 0.0    Recent Labs  Lab 11/08/19 0450 11/08/19 0450 11/08/19 1144 11/09/19 0430 11/09/19 0430 11/10/19 0437 11/11/19 0306 11/12/19 0321 11/13/19 0410 11/14/19 0307  NA 134*   < >  --  133*   < > 135 136 135 136 137  K 3.8   < >  --  3.7   < > 3.7 3.8 3.9 3.6 3.6  CL 99   < >  --  97*   < > 98 98 100 102 100  CO2 25   < >  --  26   < > 27 26 25 24 25   GLUCOSE 136*   < >  --  139*   < > 147* 138* 192* 154* 134*  BUN 5*   < >  --  6   < > 5* <5* <5* <5* 6  CREATININE 0.70   < >  --  0.71   < >  0.72 0.73 0.78 0.62 0.70  CALCIUM 8.7*   < >  --  8.7*   < > 8.7* 9.0 8.8* 8.8* 8.7*  AST 20  --   --   --   --   --   --   --   --   --   ALT 18  --   --   --   --   --   --   --   --   --   ALKPHOS 83  --   --   --   --   --   --   --   --   --   BILITOT 0.9  --   --   --   --   --   --   --   --   --   ALBUMIN 2.2*  --   --   --   --   --   --   --   --   --   MG 1.7   < >  --  1.6*   < > 2.0 1.7 1.6* 1.6* 1.8  CRP 10.4*  --   --   --   --   --   --   --   --   --   BNP  --   --  36.1 28.8  --   --   --   --   --   --    < > = values in this interval not displayed.    ------------------------------------------------------------------------------------------------------------------ No results for input(s): CHOL, HDL, LDLCALC, TRIG, CHOLHDL, LDLDIRECT in the last 72 hours.  Lab Results  Component Value Date   HGBA1C 11.3 (H) 10/26/2019     ------------------------------------------------------------------------------------------------------------------ No results for input(s): TSH, T4TOTAL, T3FREE, THYROIDAB in the last 72 hours.  Invalid input(s): FREET3  Cardiac Enzymes No results for input(s): CKMB, TROPONINI, MYOGLOBIN in the last 168 hours.  Invalid input(s): CK ------------------------------------------------------------------------------------------------------------------    Component Value Date/Time   BNP 28.8 11/09/2019 0430    Micro Results No results found for this or any previous visit (from the past 240 hour(s)).  Radiology Reports CT HEAD WO CONTRAST  Result Date: 10/19/2019 CLINICAL DATA:  Deliriums.  Mental status change. EXAM: CT HEAD WITHOUT CONTRAST TECHNIQUE: Contiguous axial images were obtained from the base of the skull through the vertex without intravenous contrast. COMPARISON:  None. FINDINGS: Brain: Area subcortical low-density involving the posterior left frontal lobe at the convexity, series 3, image 25 and series 5, image 49, likely encephalomalacia and sequela of remote infarct, however nonspecific. No hemorrhage. No evidence of acute ischemia. No hydrocephalus, midline shift or mass effect. No subdural or extra-axial collection. Vascular: Atherosclerosis of skullbase vasculature without hyperdense vessel or abnormal calcification. Skull: No fracture or focal lesion. Sinuses/Orbits: Nasogastric tube in place. Fluid levels in the sphenoid sinuses and right maxillary sinus with scattered opacification of ethmoid air cells may be related to intubation. Opacification of the right greater than left mastoid air cells. Other: None. IMPRESSION: 1. No acute intracranial abnormality. 2. Area of subcortical low-density involving the posterior left frontal lobe at the convexity, likely encephalomalacia and sequela of remote infarct. There are no prior exams for comparison to establish chronicity. MRI could  be considered for further evaluation based on clinical concern. 3. Paranasal sinus fluid levels on opacification of the right greater than left mastoid air cells, possibly related to intubation. Electronically Signed   By: Ivette Loyal.D.  On: 10/19/2019 15:30   CT ANGIO CHEST PE W OR WO CONTRAST  Result Date: 10/23/2019 CLINICAL DATA:  COVID pneumonia, acute hypoxic respiratory failure, positive D-dimer EXAM: CT ANGIOGRAPHY CHEST WITH CONTRAST TECHNIQUE: Multidetector CT imaging of the chest was performed using the standard protocol during bolus administration of intravenous contrast. Multiplanar CT image reconstructions and MIPs were obtained to evaluate the vascular anatomy. CONTRAST:  75mL OMNIPAQUE IOHEXOL 350 MG/ML SOLN COMPARISON:  None. FINDINGS: Cardiovascular: Satisfactory opacification of the pulmonary arteries to the segmental level. No evidence of pulmonary embolism. The central pulmonary arteries are of normal caliber. There is moderate coronary artery calcification. Normal heart size. No pericardial effusion. The thoracic aorta is unremarkable save for bovine arch anatomy. Right upper extremity PICC line tip is seen within the superior vena cava. Mediastinum/Nodes: No enlarged mediastinal, hilar, or axillary lymph nodes. Thyroid gland, trachea, and esophagus demonstrate no significant findings. Nasoenteric feeding tube extends into the upper abdomen beyond the margin of the examination. Lungs/Pleura: Lung volumes are small. Evaluation is limited by motion artifact, however, multifocal pulmonary infiltrates are identified demonstrating a peripheral and basal predominance, likely infectious or inflammatory in the acute setting. No central obstructing lesion. No pneumothorax or pleural effusion. No superimposed interstitial edema. Upper Abdomen: No acute abnormality. Musculoskeletal: No chest wall abnormality. No acute or significant osseous findings. Review of the MIP images confirms the  above findings. IMPRESSION: 1. No evidence of pulmonary embolism. 2. Multifocal pulmonary infiltrates are identified demonstrating a peripheral and basal predominance, likely infectious or inflammatory in the acute setting. 3. Moderate coronary artery calcification. Electronically Signed   By: Helyn Numbers MD   On: 10/23/2019 16:25   MR ANGIO HEAD WO CONTRAST  Result Date: 10/29/2019 CLINICAL DATA:  Neuro deficit with stroke suspected EXAM: MRA HEAD WITHOUT CONTRAST TECHNIQUE: Angiographic images of the Circle of Willis were obtained using MRA technique without intravenous contrast. COMPARISON:  Four days ago FINDINGS: History of bacteremia and embolic infarction. No vessel beading, stenosis, or pseudoaneurysm. IMPRESSION: Negative intracranial MRA. Electronically Signed   By: Marnee Spring M.D.   On: 10/29/2019 12:04   MR BRAIN WO CONTRAST  Result Date: 10/25/2019 CLINICAL DATA:  Neuro deficit, acute, stroke suspected. Additional history provided: COVID positive. Additional history obtained from electronic MEDICAL RECORD NUMBERSepsis with MSSA and group B strep bacteremia. EXAM: MRI HEAD WITHOUT CONTRAST TECHNIQUE: Multiplanar, multiecho pulse sequences of the brain and surrounding structures were obtained without intravenous contrast. COMPARISON:  Head CT 10/19/2019. FINDINGS: Brain: The examination is limited by poor signal on several sequences as well as motion degradation. Most notably, there is moderate motion degradation of the axial T2 weighted sequence, severe motion degradation of the axial SWI sequence and moderate motion degradation of the coronal T2 weighted sequence. Cerebral volume is normal for age. There is a 17 mm focus of restricted diffusion and corresponding T2/FLAIR hyperintensity within the subcortical left frontoparietal lobes (for instance as seen on series 2, image 39) (series 3, image 17). Additional mild scattered T2/FLAIR hyperintensity within the cerebral white matter and pons is  nonspecific, but consistent with chronic small vessel ischemic disease. Severe motion degradation of the axial SWI sequence precludes adequate evaluation for intracranial blood products. No extra-axial fluid collection. No midline shift. Vascular: Expected proximal arterial flow voids. Skull and upper cervical spine: No focal marrow lesion is identified within described limitations. Sinuses/Orbits: Visualized orbits show no acute finding. Air-fluid levels within the sphenoid and right maxillary sinuses. Partial opacification of right ethmoid air cells. Bilateral  mastoid effusions. These results will be called to the ordering clinician or representative by the Radiologist Assistant, and communication documented in the PACS or [employer]. IMPRESSION: Examination limited by poor signal on several sequences as well as motion degradation, as described. 17 mm focus of restricted diffusion and T2/FLAIR hyperintensity within the subcortical left frontoparietal lobes. This finding is nonspecific, but given the patient's history, the primary differential considerations are acute/early subacute infarct versus cerebritis. Consider contrast-enhanced MR imaging of the brain for further evaluation of this lesion. Mild chronic small vessel ischemic disease. Paranasal sinus disease with air-fluid levels. Bilateral mastoid effusions. Electronically Signed   By: Jackey Loge DO   On: 10/25/2019 15:16   MR BRAIN W CONTRAST  Result Date: 10/25/2019 CLINICAL DATA:  Brain mass follow-up EXAM: MRI HEAD WITH CONTRAST TECHNIQUE: Multiplanar, multiecho pulse sequences of the brain and surrounding structures were obtained with intravenous contrast. CONTRAST:  10mL GADAVIST GADOBUTROL 1 MMOL/ML IV SOLN COMPARISON:  Brain MRI without contrast 10/25/2019 FINDINGS: Brain: There is a peripherally enhancing lesion at the base of the left precentral gyrus that measures 0.9 x 0.7 x 1.8 cm. There are no other areas of abnormal contrast  enhancement. 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 Electronically Signed   By: Deatra Robinson M.D.   On: 10/25/2019 22:47   MR FOOT LEFT WO CONTRAST  Result Date: 10/25/2019 CLINICAL DATA:  Bacteremia left foot pain and diabetic EXAM: MRI OF THE LEFT FOOT WITHOUT CONTRAST TECHNIQUE: Multiplanar, multisequence MR imaging of the left was performed. No intravenous contrast was administered. COMPARISON:  October 23, 2019 FINDINGS: Bones/Joint/Cartilage Mildly angulated nondisplaced fractures of the midshaft of the fourth and fifth metatarsals are noted. There is periosteal reaction seen along the medial margins of the metatarsal shafts. Increased heterogeneous T2 hyperintense signal with subtle T1 hypointensity seen at the base of the fourth and fifth metatarsals. No other osseous marrow signal abnormality is seen. There is no large knee joint effusions noted. The articular surfaces appear to be maintained. Ligaments The Lisfranc ligament is intact. Muscles and Tendons Increased feathery signal with atrophy is seen throughout the muscles of the forefoot. There is a small amount of fluid seen surrounding the posterior tibialis tendon. The remainder of the flexor and extensor tendons are intact. Soft tissues Lateral plantar surface of the forefoot overlying the fifth metatarsal base there is a focal area of ulceration measuring approximately 8 mm in transverse dimension. A fluid-filled sinus tract is seen extending to the overlying osseous surface. There is a multilocular fluid collection which extends around the dorsal surface of the fifth metatarsal measuring approximately 5.4 x 0.9 x 3.4 cm. There is extensive dorsal and lateral subcutaneous edema and skin thickening. IMPRESSION: Superficial area of ulceration over the plantar lateral aspect of the fifth metatarsal base with a fluid-filled sinus tract and loculated  probable abscess extending over the dorsal surface of the fifth metatarsal measuring 5.4 x 0.9 by is a 3.4 cm. Incomplete mildly angulated fourth and fifth metatarsal shaft fractures with periosteal reaction Findings which could be suggestive of reactive marrow versus early osteomyelitis involving the base of the fourth and fifth metatarsals. Electronically Signed   By: Jonna Clark M.D.   On: 10/25/2019 19:06   DG Chest Port 1 View  Result Date: 11/09/2019 CLINICAL DATA:  Shortness of breath and cough EXAM: PORTABLE CHEST 1 VIEW COMPARISON:  October 26, 2019 FINDINGS: Central catheter tip  is in the superior vena cava. No pneumothorax. There is no edema or airspace opacity. Heart size and pulmonary vascularity are normal. No adenopathy. No bone lesions. IMPRESSION: Central catheter tip in superior vena cava. No pneumothorax. Lungs clear. Cardiac silhouette normal. Electronically Signed   By: Bretta Bang III M.D.   On: 11/09/2019 13:51   DG Chest Port 1 View  Result Date: 10/26/2019 CLINICAL DATA:  Short of breath.  COVID positive EXAM: PORTABLE CHEST 1 VIEW COMPARISON:  10/25/2019 FINDINGS: Hypoventilation with decreased lung volume. Mild bibasilar airspace disease unchanged. No new infiltrate or effusion. Right arm PICC tip in the SVC unchanged. IMPRESSION: Hypoventilation with bibasilar mild airspace disease unchanged. Electronically Signed   By: Marlan Palau M.D.   On: 10/26/2019 16:10   DG Chest Port 1 View  Result Date: 10/25/2019 CLINICAL DATA:  Shortness of breath. EXAM: PORTABLE CHEST 1 VIEW COMPARISON:  October 16, 2019. FINDINGS: The heart size and mediastinal contours are within normal limits. Hypoinflation of the lungs is noted with mild bibasilar subsegmental atelectasis. Endotracheal tube has been removed. Right-sided PICC line is unchanged in position. The visualized skeletal structures are unremarkable. IMPRESSION: Hypoinflation of the lungs with mild bibasilar subsegmental  atelectasis. Electronically Signed   By: Lupita Raider M.D.   On: 10/25/2019 08:37   DG CHEST PORT 1 VIEW  Result Date: 10/16/2019 CLINICAL DATA:  Status post intubation. EXAM: PORTABLE CHEST 1 VIEW COMPARISON:  10/14/2019 FINDINGS: ET tube tip is above the carina. There is a right arm PICC line with tip at the cavoatrial junction. Lung volumes are low. Similar appearance of bilateral pulmonary opacities compatible with multifocal pneumonia. IMPRESSION: 1. Stable support apparatus. 2. Persistent bilateral pulmonary opacities compatible with multifocal pneumonia. Electronically Signed   By: Signa Kell M.D.   On: 10/16/2019 06:14   DG Abd Portable 1V  Result Date: 10/20/2019 CLINICAL DATA:  GI problem. EXAM: PORTABLE ABDOMEN - 1 VIEW COMPARISON:  10/16/2019 FINDINGS: Feeding tube tip is in the distal stomach or proximal duodenum. Mild gaseous distention of the stomach. Gas throughout nondistended large and small bowel. No evidence of bowel obstruction, organomegaly or free air. IMPRESSION: Feeding tube tip in the distal stomach or proximal duodenum. No evidence of obstruction or free air. Electronically Signed   By: Charlett Nose M.D.   On: 10/20/2019 21:28   DG Abd Portable 1V  Result Date: 10/16/2019 CLINICAL DATA:  Status post OG tube placement EXAM: PORTABLE ABDOMEN - 1 VIEW COMPARISON:  10/16/2019 FINDINGS: The enteric tube tip projects over the distal body of stomach in the side port is below the level of the GE junction. A few air-filled loops of small bowel are noted within the upper abdomen which measure up to 2.8 cm. IMPRESSION: Satisfactory position of enteric tube. Electronically Signed   By: Signa Kell M.D.   On: 10/16/2019 06:52   DG Foot 2 Views Left  Result Date: 10/23/2019 CLINICAL DATA:  Bacteremia, LEFT foot pain and diabetic. EXAM: LEFT FOOT - 2 VIEW COMPARISON:  10/13/2019 and prior radiographs FINDINGS: Mildly angulated fractures of the 4th and 5th metatarsals are again  noted with fracture lines still evident. Healing changes along these fractures are noted. Overlying soft tissue swelling is noted. No new fracture, subluxation or dislocation noted. No definite radiographic evidence of acute osteomyelitis noted. IMPRESSION: Mildly angulated healing fractures of the 4th and 5th metatarsals with soft tissue swelling again noted but without definite radiographic evidence of acute osteomyelitis. Consider MRI for further  evaluation as clinically indicated. Electronically Signed   By: Harmon Pier M.D.   On: 10/23/2019 13:40   EEG adult  Result Date: 10/27/2019 Charlsie Quest, MD     10/28/2019 10:13 AM Patient Name: Micheal Bell MRN: [id number] Epilepsy Attending: Charlsie Quest Referring Physician/Provider: Dr. [name] Date: 10/27/2019 Duration: 22.22 minutes Patient history: 49 year old male with altered mental status. EEG evaluate for seizures. Level of alertness: Awake, asleep AEDs during EEG study: Keppra Technical aspects: This EEG study was done with scalp electrodes positioned according to the 10-20 International system of electrode placement. Electrical activity was acquired at a sampling rate of 500Hz  and reviewed with a high frequency filter of 70Hz  and a low frequency filter of 1Hz . EEG data were recorded continuously and digitally stored. Description: The posterior dominant rhythm consists of 8 Hz activity of moderate voltage (25-35 uV) seen predominantly in posterior head regions, symmetric and reactive to eye opening and eye closing. Sleep was characterized by vertex waves, sleep spindles (12 to 14 Hz), maximal frontocentral region. Frequent runs of sharp waves were seen in frontocentral region, maximal vertex lasting about 5 to 6 seconds consistent with brief ictal-interictal rhythmic discharges. Continuous generalized 5 to 6 Hz theta slowing was also noted. Hyperventilation and photic stimulation were not performed.   ABNORMALITY -Continuous slow,  generalized -Brief ictal-interictal rhythmic discharges, bilateral frontocentral region, maximal vertex IMPRESSION: This study showed evidence of potential epileptogenicity arising from bilateral frontocentral, maximal vertex region. The sharp waves are at times rhythmic consistent with brief ictal-interictal rhythmic discharges. Additionally, there is evidence of mild diffuse encephalopathy, nonspecific etiology. Priyanka   Overnight EEG with video  Result Date: 10/28/2019 , MD     10/29/2019  8:27 AM Patient Name: KJUAN SEIPP MRN: Charlsie Quest Epilepsy Attending: 12/29/2019 Referring Physician/Provider: Dr. [name] Duration: 10/27/2019 1145 to 10/28/2019 1145  Patient history: 50 year old male with altered mental status. EEG evaluate for seizures.  Level of alertness: Awake, asleep AEDs during EEG study: Keppra  Technical aspects: This EEG study was done with scalp electrodes positioned according to the 10-20 International system of electrode placement. Electrical activity was acquired at a sampling rate of 500Hz  and reviewed with a high frequency filter of 70Hz  and a low frequency filter of 1Hz . EEG data were recorded continuously and digitally stored.  Description: The posterior dominant rhythm consists of 8 Hz activity of moderate voltage (25-35 uV) seen predominantly in posterior head regions, symmetric and reactive to eye opening and eye closing. Sleep was characterized by vertex waves, sleep spindles (12 to 14 Hz), maximal frontocentral region. Initially, frequent runs of sharp waves were seen in frontocentral region, maximal vertex lasting about 5 to 6 seconds consistent with brief ictal-interictal rhythmic discharges. Continuous generalized 5 to 6 Hz theta slowing as well as intermittent generalized rhythmic 2-3hz  delta slowing  was also noted. Hyperventilation and photic stimulation were not performed.    ABNORMALITY -Continuous slow, generalized - Intermittent  rhythmic delta slowing, generalized -Brief ictal-interictal rhythmic discharges, bilateral frontocentral region, maximal vertex  IMPRESSION: This study initially showed evidence of potential epileptogenicity arising from bilateral frontocentral, maximal vertex region. The sharp waves are at times rhythmic consistent with brief ictal-interictal rhythmic discharges. Additionally, there is evidence of mild diffuse encephalopathy, nonspecific etiology. No seizures were seen during thi study. EEG appears to be improving compared to previous day.  12/27/2019   ECHO TEE  Result Date: 10/24/2019    TRANSESOPHOGEAL ECHO REPORT  Patient Name:   KIONDRE GUZEK Date of Exam: 10/24/2019 Medical Rec #:  [id number]      Height:       74.0 in Accession #:    [id number]     Weight:       247.4 lb Date of Birth:  02-Jan-1971      BSA:          2.379 m Patient Age:    49 years       BP:           120/80 mmHg Patient Gender: M              HR:           100 bpm. Exam Location:  Inpatient Procedure: Transesophageal Echo Indications:    Emobli/Bacteremia  History:        Patient has prior history of Echocardiogram examinations.  Sonographer:    Ross Ludwig RDCS (AE) Referring Phys: [id number] HAO MENG PROCEDURE: The transesophogeal probe was passed without difficulty through the esophogus of the patient. Sedation performed by different physician. The patient's vital signs; including heart rate, blood pressure, and oxygen saturation; remained stable throughout the procedure. The patient developed no complications during the procedure. IMPRESSIONS  1. Left ventricular ejection fraction, by estimation, is 50 to 55%. The left ventricle has low normal function. The left ventricle has no regional wall motion abnormalities.  2. Right ventricular systolic function is normal. The right ventricular size is normal.  3. No left atrial/left atrial appendage thrombus was detected.  4. The mitral valve is normal in structure. Trivial mitral valve  regurgitation. No evidence of mitral stenosis.  5. The aortic valve is normal in structure. Aortic valve regurgitation is not visualized. No aortic stenosis is present.  6. The inferior vena cava is normal in size with greater than 50% respiratory variability, suggesting right atrial pressure of 3 mmHg. Conclusion(s)/Recommendation(s): Normal biventricular function without evidence of hemodynamically significant valvular heart disease. FINDINGS  Left Ventricle: Left ventricular ejection fraction, by estimation, is 50 to 55%. The left ventricle has low normal function. The left ventricle has no regional wall motion abnormalities. The left ventricular internal cavity size was normal in size. There is no left ventricular hypertrophy. Right Ventricle: The right ventricular size is normal. No increase in right ventricular wall thickness. Right ventricular systolic function is normal. Left Atrium: Left atrial size was normal in size. No left atrial/left atrial appendage thrombus was detected. Right Atrium: Right atrial size was normal in size. Pericardium: There is no evidence of pericardial effusion. Mitral Valve: The mitral valve is normal in structure. Normal mobility of the mitral valve leaflets. Trivial mitral valve regurgitation. No evidence of mitral valve stenosis. There is no evidence of mitral valve vegetation. Tricuspid Valve: The tricuspid valve is normal in structure. Tricuspid valve regurgitation is mild . No evidence of tricuspid stenosis. There is no evidence of tricuspid valve vegetation. Aortic Valve: The aortic valve is normal in structure. Aortic valve regurgitation is not visualized. No aortic stenosis is present. There is no evidence of aortic valve vegetation. Pulmonic Valve: The pulmonic valve was normal in structure. Pulmonic valve regurgitation is trivial. No evidence of pulmonic stenosis. Aorta: The aortic root is normal in size and structure. Venous: The inferior vena cava is normal in size  with greater than 50% respiratory variability, suggesting right atrial pressure of 3 mmHg. IAS/Shunts: No atrial level shunt detected by color flow Doppler. [name] MD Electronically signed by [name]  MD Signature Date/Time: 10/24/2019/3:28:45 PM    Final    VAS US CAROTID (at [facility] and [facility] only)  Result Date: 10/26/2019 Carotid Arterial Duplex Study Indications:       17mm focus of diffusion restriction in the left                    frontoparietal lobe concerning for acute/early subacute                    infarct vs cerebritis. Risk Factors:      Hypertension, Diabetes. Other Factors:     Covid-19, bacteremia. Comparison Study:  No prior study on file Performing Technologist: Sherren Kerns RVS  Examination Guidelines: A complete evaluation includes B-mode imaging, spectral Doppler, color Doppler, and power Doppler as needed of all accessible portions of each vessel. Bilateral testing is considered an integral part of a complete examination. Limited examinations for reoccurring indications may be performed as noted.  Right Carotid Findings: +----------+--------+--------+--------+------------------+--------+           PSV cm/sEDV cm/sStenosisPlaque DescriptionComments +----------+--------+--------+--------+------------------+--------+ CCA Prox  66      11                                         +----------+--------+--------+--------+------------------+--------+ CCA Distal71      18                                         +----------+--------+--------+--------+------------------+--------+ ICA Prox  58      19                                         +----------+--------+--------+--------+------------------+--------+ ICA Distal90      33                                         +----------+--------+--------+--------+------------------+--------+ ECA       114     16                                          +----------+--------+--------+--------+------------------+--------+ +----------+--------+-------+--------+-------------------+           PSV cm/sEDV cmsDescribeArm Pressure (mmHG) +----------+--------+-------+--------+-------------------+ [id number]                                        +----------+--------+-------+--------+-------------------+ +---------+--------+--+--------+--+ VertebralPSV cm/s38EDV cm/s11 +---------+--------+--+--------+--+  Left Carotid Findings: +----------+--------+--------+--------+------------------+--------+           PSV cm/sEDV cm/sStenosisPlaque DescriptionComments +----------+--------+--------+--------+------------------+--------+ CCA Prox  55      14                                         +----------+--------+--------+--------+------------------+--------+ CCA Distal65      20                                         +----------+--------+--------+--------+------------------+--------+  ICA Prox  68      20                                         +----------+--------+--------+--------+------------------+--------+ ICA Distal76      23                                         +----------+--------+--------+--------+------------------+--------+ ECA       77      3                                          +----------+--------+--------+--------+------------------+--------+ +----------+--------+--------+--------+-------------------+           PSV cm/sEDV cm/sDescribeArm Pressure (mmHG) +----------+--------+--------+--------+-------------------+ [id number]                                          +----------+--------+--------+--------+-------------------+ +---------+--------+--+--------+--+ VertebralPSV cm/s63EDV cm/s18 +---------+--------+--+--------+--+   Summary: Right Carotid: The extracranial vessels were near-normal with only minimal wall                thickening or plaque. Left Carotid: The extracranial  vessels were near-normal with only minimal wall               thickening or plaque. Vertebrals:  Bilateral vertebral arteries demonstrate antegrade flow. Subclavians: Normal flow hemodynamics were seen in bilateral subclavian              arteries. *See table(s) above for measurements and observations.  Electronically signed by Delia Heady MD on 10/26/2019 at 12:37:48 PM.    Final    VAS [place] LOWER EXTREMITY ARTERIAL DUPLEX  Result Date: 10/26/2019 LOWER EXTREMITY ARTERIAL DUPLEX STUDY Indications: Ulceration, and Osteomyelitis. High Risk Factors: Hypertension, Diabetes. Other Factors: Covid-19. Charcot foot.  Current ABI: N/A Comparison Study: No prior study Performing Technologist: Sherren Kerns RVS  Examination Guidelines: A complete evaluation includes B-mode imaging, spectral Doppler, color Doppler, and power Doppler as needed of all accessible portions of each vessel. Bilateral testing is considered an integral part of a complete examination. Limited examinations for reoccurring indications may be performed as noted.  +----------+--------+-----+--------+---------+--------+ LEFT      PSV cm/sRatioStenosisWaveform Comments +----------+--------+-----+--------+---------+--------+ CFA Prox  123                  triphasic         +----------+--------+-----+--------+---------+--------+ DFA       51                   triphasic         +----------+--------+-----+--------+---------+--------+ SFA Prox  97                   triphasic         +----------+--------+-----+--------+---------+--------+ SFA Mid   88                   triphasic         +----------+--------+-----+--------+---------+--------+ SFA Distal81                   triphasic         +----------+--------+-----+--------+---------+--------+  POP Prox  60                   triphasic         +----------+--------+-----+--------+---------+--------+ POP Distal86                   triphasic          +----------+--------+-----+--------+---------+--------+ ATA Distal110                  triphasic         +----------+--------+-----+--------+---------+--------+ PTA Prox  34                   triphasic         +----------+--------+-----+--------+---------+--------+ PTA Mid   29                   triphasic         +----------+--------+-----+--------+---------+--------+ PTA [id number]                  triphasic         +----------+--------+-----+--------+---------+--------+  Summary: Left: Normal waveforms and adequate flow noted.  See table(s) above for measurements and observations. Electronically signed by Lemar Livings MD on 10/26/2019 at 4:50:14 PM.    Final    VAS [place] LOWER EXTREMITY ARTERIAL DUPLEX  Result Date: 10/19/2019 LOWER EXTREMITY ARTERIAL DUPLEX STUDY Indications: Cold leg, Covid-19.  Current ABI: N/A Comparison Study: No prior study Performing Technologist: Sherren Kerns RVS  Examination Guidelines: A complete evaluation includes B-mode imaging, spectral Doppler, color Doppler, and power Doppler as needed of all accessible portions of each vessel. Bilateral testing is considered an integral part of a complete examination. Limited examinations for reoccurring indications may be performed as noted.  +-----------+--------+-----+--------+---------+--------+ RIGHT      PSV cm/sRatioStenosisWaveform Comments +-----------+--------+-----+--------+---------+--------+ CFA Prox   67                   triphasic         +-----------+--------+-----+--------+---------+--------+ DFA        60                   triphasic         +-----------+--------+-----+--------+---------+--------+ SFA Prox   68                   triphasic         +-----------+--------+-----+--------+---------+--------+ SFA Mid    62                   triphasic         +-----------+--------+-----+--------+---------+--------+ SFA Distal 69                   triphasic          +-----------+--------+-----+--------+---------+--------+ POP Prox   47                   triphasic         +-----------+--------+-----+--------+---------+--------+ POP Distal 42                   triphasic         +-----------+--------+-----+--------+---------+--------+ ATA Distal 15                   triphasic         +-----------+--------+-----+--------+---------+--------+ PTA Prox   31                   triphasic         +-----------+--------+-----+--------+---------+--------+  PTA Mid    38                   triphasic         +-----------+--------+-----+--------+---------+--------+ PTA Distal 38                   triphasic         +-----------+--------+-----+--------+---------+--------+ PERO Distal36                   triphasic         +-----------+--------+-----+--------+---------+--------+  Summary: Right: Normal examination. No evidence of arterial occlusive disease. Normal waveforms, no stenosis.  See table(s) above for measurements and observations. Electronically signed by Sherald Hess MD on 10/19/2019 at 3:25:02 PM.    Final    VAS [place] LOWER EXTREMITY VENOUS (DVT)  Result Date: 10/26/2019  Lower Venous DVTStudy Indications: Covid-19, elevated D-Dimer.  Comparison Study: Prior negative left lower extremity venous duplex don 03/19/17                   and is available for comparison. Performing Technologist: Sherren Kerns RVS  Examination Guidelines: A complete evaluation includes B-mode imaging, spectral Doppler, color Doppler, and power Doppler as needed of all accessible portions of each vessel. Bilateral testing is considered an integral part of a complete examination. Limited examinations for reoccurring indications may be performed as noted. The reflux portion of the exam is performed with the patient in reverse Trendelenburg.  +---------+---------------+---------+-----------+----------+--------------+ RIGHT     CompressibilityPhasicitySpontaneityPropertiesThrombus Aging +---------+---------------+---------+-----------+----------+--------------+ CFV      Full           Yes      Yes                                 +---------+---------------+---------+-----------+----------+--------------+ SFJ      Full                                                        +---------+---------------+---------+-----------+----------+--------------+ FV Prox  Full                                                        +---------+---------------+---------+-----------+----------+--------------+ FV Mid   Full                                                        +---------+---------------+---------+-----------+----------+--------------+ FV DistalFull                                                        +---------+---------------+---------+-----------+----------+--------------+ PFV      Full                                                        +---------+---------------+---------+-----------+----------+--------------+  POP      Partial        Yes      Yes                  Acute          +---------+---------------+---------+-----------+----------+--------------+ PTV      Full                                                        +---------+---------------+---------+-----------+----------+--------------+ PERO     Full                                                        +---------+---------------+---------+-----------+----------+--------------+   +---------+---------------+---------+-----------+----------+--------------+ LEFT     CompressibilityPhasicitySpontaneityPropertiesThrombus Aging +---------+---------------+---------+-----------+----------+--------------+ CFV      Full           Yes      Yes                                 +---------+---------------+---------+-----------+----------+--------------+ SFJ      Full                                                         +---------+---------------+---------+-----------+----------+--------------+ FV Prox  Full                                                        +---------+---------------+---------+-----------+----------+--------------+ FV Mid   Full                                                        +---------+---------------+---------+-----------+----------+--------------+ FV DistalFull                                                        +---------+---------------+---------+-----------+----------+--------------+ PFV      Full                                                        +---------+---------------+---------+-----------+----------+--------------+ POP      Full           Yes      Yes                                 +---------+---------------+---------+-----------+----------+--------------+  PTV      Full                                                        +---------+---------------+---------+-----------+----------+--------------+ PERO     Full                                                        +---------+---------------+---------+-----------+----------+--------------+     Summary: RIGHT: - Findings consistent with acute deep vein thrombosis involving the right popliteal vein. - Ultrasound characteristics of enlarged lymph nodes are noted in the groin.  LEFT: - There is no evidence of deep vein thrombosis in the lower extremity.  *See table(s) above for measurements and observations. Electronically signed by Lemar Livings MD on 10/26/2019 at 4:50:27 PM.    Final

## 2019-11-15 DIAGNOSIS — R7881 Bacteremia: Secondary | ICD-10-CM | POA: Diagnosis not present

## 2019-11-15 DIAGNOSIS — R0902 Hypoxemia: Secondary | ICD-10-CM

## 2019-11-15 DIAGNOSIS — I749 Embolism and thrombosis of unspecified artery: Secondary | ICD-10-CM

## 2019-11-15 DIAGNOSIS — U071 COVID-19: Secondary | ICD-10-CM

## 2019-11-15 DIAGNOSIS — M6281 Muscle weakness (generalized): Secondary | ICD-10-CM | POA: Diagnosis not present

## 2019-11-15 DIAGNOSIS — L02612 Cutaneous abscess of left foot: Secondary | ICD-10-CM | POA: Diagnosis not present

## 2019-11-15 DIAGNOSIS — J9601 Acute respiratory failure with hypoxia: Secondary | ICD-10-CM

## 2019-11-15 DIAGNOSIS — R739 Hyperglycemia, unspecified: Secondary | ICD-10-CM

## 2019-11-15 DIAGNOSIS — I5021 Acute systolic (congestive) heart failure: Secondary | ICD-10-CM

## 2019-11-15 DIAGNOSIS — J22 Unspecified acute lower respiratory infection: Secondary | ICD-10-CM

## 2019-11-15 DIAGNOSIS — E118 Type 2 diabetes mellitus with unspecified complications: Secondary | ICD-10-CM

## 2019-11-15 LAB — GLUCOSE, CAPILLARY
Glucose-Capillary: 168 mg/dL — ABNORMAL HIGH (ref 70–99)
Glucose-Capillary: 189 mg/dL — ABNORMAL HIGH (ref 70–99)
Glucose-Capillary: 159 mg/dL — ABNORMAL HIGH (ref 70–99)

## 2019-11-15 MED ORDER — ATORVASTATIN CALCIUM 40 MG PO TABS
40.0000 mg | ORAL_TABLET | Freq: Every day | ORAL | 0 refills | Status: DC
Start: 2019-11-15 — End: 2020-02-12

## 2019-11-15 MED ORDER — SODIUM CHLORIDE 0.9 % IV SOLN: 12.0000 g | INTRAVENOUS | 0 refills | Status: AC

## 2019-11-15 MED ORDER — APIXABAN 5 MG PO TABS
5.0000 mg | ORAL_TABLET | Freq: Two times a day (BID) | ORAL | 0 refills | Status: DC
Start: 2019-11-15 — End: 2020-02-12

## 2019-11-15 MED ORDER — NAFCILLIN IV (FOR PTA / DISCHARGE USE ONLY): 2.0000 g | INTRAVENOUS | 0 refills | Status: DC

## 2019-11-15 MED ORDER — CYCLOBENZAPRINE HCL 10 MG PO TABS: 5.0000 mg | ORAL_TABLET | Freq: Two times a day (BID) | ORAL | 0 refills | Status: AC | PRN

## 2019-11-15 MED ORDER — NAFCILLIN IV (FOR PTA / DISCHARGE USE ONLY): 12.0000 g | INTRAVENOUS | 0 refills | Status: DC

## 2019-11-15 MED ORDER — INSULIN GLARGINE 100 UNIT/ML SOLOSTAR PEN: 20.0000 [IU] | PEN_INJECTOR | Freq: Every day | SUBCUTANEOUS | 0 refills | Status: AC

## 2019-11-15 MED ORDER — METOPROLOL TARTRATE 75 MG PO TABS: 75.0000 mg | ORAL_TABLET | Freq: Two times a day (BID) | ORAL | 0 refills | Status: DC

## 2019-11-15 MED ORDER — HEPARIN SOD (PORK) LOCK FLUSH 100 UNIT/ML IV SOLN
250.0000 [IU] | INTRAVENOUS | Status: AC | PRN
  Administered 2019-11-15: 250 [IU]
  Filled 2019-11-15: qty 2.5

## 2019-11-15 MED ORDER — LEVETIRACETAM 500 MG PO TABS: 500.0000 mg | ORAL_TABLET | Freq: Two times a day (BID) | ORAL | 0 refills | Status: DC

## 2019-11-15 MED FILL — LANTUS SOLOSTAR 100 UNITS/M: 100 | 30 days supply | Qty: 6 | Fill #0

## 2019-11-15 MED FILL — ELIQUIS 5 MG TABLET: 5 | 30 days supply | Qty: 60 | Fill #0

## 2019-11-15 MED FILL — METOPROLOL TARTRATE 50 MG T: 50 | 30 days supply | Qty: 90 | Fill #0

## 2019-11-15 MED FILL — PENTIPS 32G X 4 MM MISC: 32G X 4 MM | 30 days supply | Qty: 100 | Fill #0

## 2019-11-15 MED FILL — ATORVASTATIN CALCIUM 40 MG: 40 | 30 days supply | Qty: 30 | Fill #0

## 2019-11-15 MED FILL — CYCLOBENZAPRINE 10 MG TAB: 10 | 15 days supply | Qty: 15 | Fill #0

## 2019-11-15 MED FILL — levETIRAcetam 500 MG TABS: 500 | 30 days supply | Qty: 60 | Fill #0

## 2019-11-15 NOTE — Progress Notes (Signed)
PHARMACY CONSULT NOTE FOR:  OUTPATIENT  PARENTERAL ANTIBIOTIC THERAPY (OPAT)  Indication: MSSA and GBS bacteremia Regimen: Nafcillin 12gm IV Q24H as continuous infusion End date: 11/22/19  IV antibiotic discharge orders are pended. To discharging provider:  please sign these orders via discharge navigator,  Select New Orders & click on the button choice - Manage This Unsigned Work.     Thank you for allowing pharmacy to be a part of this patient's care.  Awilda Covin D. Laney Potash, PharmD, BCPS, BCCCP 11/15/2019, 12:20 PM

## 2019-11-15 NOTE — Progress Notes (Signed)
Occupational Therapy Treatment Patient Details Name: Micheal Bell MRN: [id number] DOB: [birth year] Today's Date: 11/15/2019    History of present illness Pt is a 49 y.o. male admitted 10/09/19 for COVID PNA. Pt decompensated 8/19 and intubated; self extubated 8/23 and reintubated; extubated 8/28. Course complicated by MSSA and GBS bacteria, sepsis, systolic HF, acute metabolic encephalopathy. 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 TEE without evidence of vegitation. 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   OT comments  Pt progressing towards acute OT goals. Focus of session was grooming task standing at sink. Pt then asked to walk in the halls a bit. Walked household distance with standing rest breaks incorporated. Also discussed and demonstrated some PROM/AAROM B shoulder exercises to help reduce risk of adhesive capsulitis 2/2 RC insufficiencies . D/c plan updated to HHOT.   Follow Up Recommendations  Home health OT;Supervision - Intermittent    Equipment Recommendations  3 in 1 bedside commode;Hospital bed    Recommendations for Other Services      Precautions / Restrictions Precautions Precautions: Fall Precaution Comments: Mod fall Required Braces or Orthoses: Other Brace Restrictions Weight Bearing Restrictions: No       Mobility Bed Mobility Overal bed mobility: Needs Assistance Bed Mobility: Supine to Sit;Sit to Supine     Supine to sit: Min assist Sit to supine: Modified independent (Device/Increase time)   General bed mobility comments: min assist to fully powerup trunk  Transfers Overall transfer level: Needs assistance Equipment used: Rolling walker (2 wheeled) Transfers: Sit to/from  Stand Sit to Stand: Min guard;From elevated surface         General transfer comment: elevated bed height inpart to facilitate hand placement on rw 2/2 RC limitations    Balance Overall balance assessment: Needs assistance Sitting-balance support: Feet supported Sitting balance-Leahy Scale: Good Sitting balance - Comments: supervision only   Standing balance support: Bilateral upper extremity supported;During functional activity Standing balance-Leahy Scale: Fair Standing balance comment: reliant on external support in standing. leaning into sink for grooming task                           ADL either performed or assessed with clinical judgement   ADL Overall ADL's : Needs assistance/impaired     Grooming: Oral care;Min guard;Standing                               Functional mobility during ADLs: Min guard;Rolling walker General ADL Comments: Pt completed grooming task in standing then asked to walk in the hall. Completed household distance functional mobility at min guard level utilizing rw. Pt taking intermittent standing rest breaks as needed. Offered but pt declined seated rest break at mid point (end of hallway)     Vision       Perception     Praxis      Cognition Arousal/Alertness: Awake/alert Behavior During Therapy: WFL for tasks assessed/performed Overall Cognitive Status: Within Functional Limits for tasks assessed  Exercises Other Exercises Other Exercises: Discussed PROM/AAROM of B shoulders to help prevent adhesive capsulitis.    Shoulder Instructions       General Comments      Pertinent Vitals/ Pain       Pain Assessment: Faces Faces Pain Scale: Hurts little more Pain Location: shoulders Pain Descriptors / Indicators: Aching;Discomfort Pain Intervention(s): Monitored during session;Limited activity within patient's tolerance  Home Living                                           Prior Functioning/Environment              Frequency  Min 2X/week        Progress Toward Goals  OT Goals(current goals can now be found in the care plan section)  Progress towards OT goals: Progressing toward goals  Acute Rehab OT Goals Patient Stated Goal: get home with his family OT Goal Formulation: With patient Time For Goal Achievement: 11/20/19 Potential to Achieve Goals: Good ADL Goals Pt Will Perform Eating: with set-up;sitting Pt Will Perform Grooming: with set-up;sitting Pt Will Perform Upper Body Bathing: with set-up;with supervision;sitting Pt Will Transfer to Toilet: bedside commode;squat pivot transfer;with +2 assist;with mod assist Additional ADL Goal #1: patient will maintain midline postural control EOB with supervision during grooming task. Additional ADL Goal #2: Pt will require min vc to sustain attention to ADL task in minimally distracting environment  Plan Discharge plan needs to be updated    Co-evaluation                 AM-PAC OT "6 Clicks" Daily Activity     Outcome Measure   Help from another person eating meals?: None Help from another person taking care of personal grooming?: None Help from another person toileting, which includes using toliet, bedpan, or urinal?: A Little Help from another person bathing (including washing, rinsing, drying)?: A Little Help from another person to put on and taking off regular upper body clothing?: None Help from another person to put on and taking off regular lower body clothing?: A Little 6 Click Score: 21    End of Session Equipment Utilized During Treatment: Gait belt;Rolling walker  OT Visit Diagnosis: Unsteadiness on feet (R26.81);Other abnormalities of gait and mobility (R26.89);Muscle weakness (generalized) (M62.81);Other symptoms and signs involving cognitive function;Hemiplegia and hemiparesis;Pain Hemiplegia - Right/Left: Right Hemiplegia -  dominant/non-dominant: Dominant Hemiplegia - caused by: Cerebral infarction Pain - Right/Left: Left Pain - part of body: Shoulder   Activity Tolerance Patient tolerated treatment well   Patient Left in bed;with call bell/phone within reach   Nurse Communication          Time: 0923-1000 OT Time Calculation (min): 37 min  Charges: OT General Charges $OT Visit: 1 Visit OT Treatments $Self Care/Home Management : 23-37 mins  Raynald Kemp, OT Acute Rehabilitation Services Pager: [phone] Office: [phone]   Pilar Grammes 11/15/2019, 12:26 PM

## 2019-11-15 NOTE — Discharge Summary (Signed)
Physician Discharge Summary  Micheal Bell YQI:[id number] DOB: March 01, 1970 DOA: 10/09/2019  PCP: [name], MD  Admit date: 10/09/2019 Discharge date: 11/15/2019  Admitted From: Home Disposition:  Home  Recommendations for Outpatient Follow-up:  1. Follow up with PCP in 1-2 weeks; Dr. Linus Salmons 12/06/19 @1030AM   Home Health: PT  Equipment/Devices: Hospital bed  Discharge Condition: Stable CODE STATUS: Full Diet recommendation:    Brief/Interim Summary:  49 yo male with hx of HTN, T2DM, gout, IBS, fibromyalgia, obesity who presented on 8/16 for COVID pneumonia. He decompensated on 8/19 and was intubated. Self extubated on 8/23 and was reintubated. He's subsequently been extubated on 8/28. He's been on typical therapies for covid including steroids, baricitinib and remdesivir. His hospitalization was complicated by MSSA and GBS bacteria as well as transient reduction in cardiac output which appears to have resolved.  At this time patient is stable, recovered from his pneumonia and is currently completing treatment for MSSA bacteremia likely seeded by left foot abscess.  Patient will continue on nafcillin through 11/22/2019 and then initiate Keflex 4 times daily with close follow-up with Dr. Linus Salmons on October 13.  Patient was initially planned for discharge to SNF versus CIR given mental status and ambulatory dysfunction but given his marked improvement over the past few days to week patient is now stable and agreeable for discharge home.  Patient will be set up with outpatient physical therapy and home health to continue infusions as discussed with nafcillin.  Close follow-up with PCP, infectious disease and surgery as scheduled.  If worsening symptoms including pain, fever, chills, erythema at the surgical site or mental status changes would recommend patient to immediately report back to the emergency department for evaluation.   Sepsis with MSSA and Group B strep bacteremia due to left  foot abscess.  - ID following, was on IV Ancef but switch to nafcillin on 10/26/2019.   - Clean TEE, history of Charcot joints, peripheral neuropathy and recent left foot soft tissue injury, MRI left foot shows an abscess - Ortho was consulted and they recommended medical management with outpatient follow-up with Dr. Sharol Given post discharge, also has osteomyelitis, defer duration of treatment with antibiotics to ID.   - Antibiotics management per ID, plan to continue total of 4 weeks of IV nafcillin, through 9/29, then start Keflex 500 mg 4 times daily, he has a follow-up arranged with Dr. Linus Salmons on October 13 at 10:30 AM .  Acute Hypoxic Resp. Failure due to Acute Covid 19 Viral Pneumonitis during the ongoing 2020 Covid 19 Pandemic, RESOLVED - he had severe hypoxia and parenchymal lung injury he was intubated and admitted by ICU - Completed IV steroids, remdesivir and Baricitinib (started on 10/12/2019).   - Extubated on 10/21/2019.  - His stay was complicated by bacteremia after which Baricitinib was discontinued on 10/24/2019.  - His pulmonary status is gradually improving, currently he is stable and symptom-free, he is on room air, I have discussed with him about incentive spirometry, flutter valve, pulmonary toiletry, he is now off of isolation and his pulmonary issues from COVID-19 is clinically resolved.  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  For seizures  he has been started on Keppra with good effect and his mentation has improved considerably.  Unlikely systolic heart failure as shown by TTE however this could have been transient due to sepsis, on repeat TEE his EF is  now normalized to 50 to 55%.  He is currently compensated, blood pressure was too low and after hydration has improved, continue beta-blocker.  History of chronic pain.   On multiple narcotics, Flexeril and benzodiazepines.  Was off of these medications for several days without any discomfort Reintroduced on his request at very low doses.  Do not escalate - he continues to ask for advancement despite our discussion that he needs to wean off these if at all possible.  We will have patient follow-up with PCP.  HTN  BP was low on multiple blood pressure medications, was hydrated now able to tolerate low-dose beta-blocker.  Acute DVT in the right lower extremity.   Eliquis started on 11/11/2019.   Hypomagnesemia.  Has been aggressively replaced with IV and is on scheduled oral magnesium, PPI has been stopped, on lower home dose Lasix only which was actually started on 11/13/2019  DM type II, severely uncontrolled.  Continue on Lantus Lab Results  Component Value Date   HGBA1C 11.3 (H) 10/26/2019   Discharge Diagnoses:  Active Problems:   Lower respiratory tract infection due to COVID-19 virus   Hyperglycemia   Hypoxia   Acute respiratory failure with hypoxemia (HCC)   Diabetic foot (HCC)   Acute systolic heart failure (HCC)   Embolic infarction [facility])   Cutaneous abscess of left foot    Discharge Instructions  Discharge Instructions    [facility] pharmacist to adjust dose for Vancomycin, Aminoglycosides and other anti-infective therapies as requested by physician.   Complete by: As directed    [facility] pharmacist to adjust dose for Vancomycin, Aminoglycosides and other anti-infective therapies as requested by physician.   Complete by: As directed    [facility] to provide Cath Flo 59m   Complete by: As directed    Administer for PICC line occlusion and as ordered by physician for other access device issues.   [facility] to provide  Cath Flo 26m  Complete by: As directed    Administer for PICC line occlusion and as ordered by physician for other access device issues.   Ambulatory referral to Physical Therapy   Complete by: As directed    Iontophoresis - 4 mg/ml of dexamethasone: No   T.E.N.S. Unit Evaluation and Dispense as Indicated: No   Ambulatory referral to Physical Therapy   Complete by: As directed    Anaphylaxis Kit: Provided to treat any anaphylactic reaction to the medication being provided to the patient if First Dose or when requested by physician   Complete by: As directed    Epinephrine 30m45ml vial / amp: Administer 0.3mg37m.3ml)45mbcutaneously once for moderate to severe anaphylaxis, nurse to call physician and pharmacy when reaction occurs and call 911 if needed for immediate care   Diphenhydramine 50mg/37mV vial: Administer 25-50mg I4m PRN for first dose reaction, rash, itching, mild reaction, nurse to call physician and pharmacy when reaction occurs   Sodium Chloride 0.9% NS 500ml IV37mminister if needed for hypovolemic blood pressure drop or as ordered by physician after call to physician with anaphylactic reaction   Anaphylaxis Kit: Provided to treat any anaphylactic reaction to the medication being provided to the patient if First Dose or when requested by physician  Complete by: As directed    Epinephrine 768m/ml vial / amp: Administer 0.3868m(0.68m38msubcutaneously once for moderate to severe anaphylaxis, nurse to call physician and pharmacy when reaction occurs and call 911 if needed for immediate care   Diphenhydramine 42m70m IV vial: Administer 25-42mg28mIM PRN for first dose reaction, rash, itching, mild reaction, nurse to call physician and pharmacy when reaction occurs   Sodium Chloride 0.9% NS 500ml 168mAdminister if needed for hypovolemic blood pressure drop or as ordered by physician after call to physician with anaphylactic reaction   Call MD for:  temperature >100.4   Complete by: As  directed    Change dressing on IV access line weekly and PRN   Complete by: As directed    Change dressing on IV access line weekly and PRN   Complete by: As directed    Diet Carb Modified   Complete by: As directed    Flush IV access with Sodium Chloride 0.9% and Heparin 10 units/ml or 100 units/ml   Complete by: As directed    Flush IV access with Sodium Chloride 0.9% and Heparin 10 units/ml or 100 units/ml   Complete by: As directed    Home infusion instructions - [facility]   Complete by: As directed    Instructions: Flush IV access with Sodium Chloride 0.9% and Heparin 10units/ml or 100units/ml   Change dressing on IV access line: Weekly and PRN   Instructions Cath Flo 2mg: A66mnister for PICC Line occlusion and as ordered by physician for other access device   [facility] pharmacist to adjust dose for: Vancomycin, Aminoglycosides and other anti-infective therapies as requested by physician   Home infusion instructions - [facility]   Complete by: As directed    Instructions: Flush IV access with Sodium Chloride 0.9% and Heparin 10units/ml or 100units/ml   Change dressing on IV access line: Weekly and PRN   Instructions Cath Flo 2mg: Ad85mister for PICC Line occlusion and as ordered by physician for other access device   [facility] pharmacist to adjust dose for: Vancomycin, Aminoglycosides and other anti-infective therapies as requested by physician   Increase activity slowly   Complete by: As directed    Method of administration may be changed at the discretion of home infusion pharmacist based upon assessment of the patient and/or caregiver's ability to self-administer the medication ordered   Complete by: As directed    Method of administration may be changed at the discretion of home infusion pharmacist based upon assessment of the patient and/or caregiver's ability to self-administer the medication ordered   Complete by: As  directed    No wound care   Complete by: As directed    Outpatient Parenteral Antibiotic Therapy Information Antibiotic: Nafcillin IVPB; Indications for use: bacteremia; End Date: 11/22/2019   Complete by: As directed    Antibiotic: Nafcillin IVPB   Indications for use: bacteremia   End Date: 11/22/2019     Allergies as of 11/15/2019      Reactions   Azithromycin    Lodine [etodolac] Anaphylaxis      Medication List    STOP taking these medications   chlorthalidone 25 MG tablet Commonly known as: HYGROTON   citalopram 40 MG tablet Commonly known as: CELEXA   glipiZIDE 10 MG tablet Commonly known as: GLUCOTROL   ibuprofen 600 MG tablet Commonly known as: ADVIL   meloxicam 15 MG tablet Commonly known as: MOBIC   pioglitazone 15 MG tablet Commonly known  as: ACTOS   verapamil 240 MG CR tablet Commonly known as: CALAN-SR     TAKE these medications   albuterol 108 (90 Base) MCG/ACT inhaler Commonly known as: VENTOLIN HFA Inhale into the lungs every 6 (six) hours as needed for wheezing or shortness of breath.   allopurinol 300 MG tablet Commonly known as: ZYLOPRIM Take 300 mg by mouth daily.   apixaban 5 MG Tabs tablet Commonly known as: ELIQUIS Take 1 tablet (5 mg total) by mouth 2 (two) times daily.   atorvastatin 40 MG tablet Commonly known as: LIPITOR Take 1 tablet (40 mg total) by mouth daily.   buPROPion 150 MG 24 hr tablet Commonly known as: WELLBUTRIN XL Take 150 mg by mouth every morning.   cyclobenzaprine 10 MG tablet Commonly known as: FLEXERIL Take 0.5 tablets (5 mg total) by mouth 2 (two) times daily as needed for muscle spasms. What changed:   how much to take  when to take this   [place] into the lungs.   furosemide 20 MG tablet Commonly known as: LASIX Take 20 mg by mouth.   HYDROcodone-acetaminophen 5-325 MG tablet Commonly known as: NORCO/VICODIN Take 1 tablet by mouth every 6 (six) hours as needed for moderate pain.    insulin glargine 100 UNIT/ML Solostar Pen Commonly known as: LANTUS Inject 20 Units into the skin daily.   levETIRAcetam 500 MG tablet Commonly known as: KEPPRA Take 1 tablet (500 mg total) by mouth 2 (two) times daily.   lidocaine 5 % Commonly known as: Lidoderm Place 1 patch onto the skin daily. Remove & Discard patch within 12 hours or as directed by MD   Metoprolol Tartrate 75 MG Tabs Take 75 mg by mouth 2 (two) times daily.   mometasone-formoterol 100-5 MCG/ACT Aero Commonly known as: DULERA Inhale 2 puffs into the lungs 2 (two) times daily.   montelukast 10 MG tablet Commonly known as: SINGULAIR Take 10 mg by mouth at bedtime.   nafcillin  IVPB Inject 12 g into the vein daily. Indication:  MSSA and GBS bacteremia First Dose: No Last Day of Therapy:  11/22/19 Labs - Once weekly:  CBC/D and BMP, Labs - Every other week:  ESR and CRP Method of administration: Elastomeric (Continuous infusion) Method of administration may be changed at the discretion of home infusion pharmacist based upon assessment of the patient and/or caregiver's ability to self-administer the medication ordered.   nafcillin  IVPB Inject 12 g into the vein daily. As a continuous infusion. Indication:  bacteremia First Dose: No Last Day of Therapy:  11/22/19 Labs - Once weekly:  CBC/D and BMP, Labs - Every other week:  ESR and CRP Method of administration: Elastomeric (Continuous infusion) Method of administration may be changed at the discretion of home infusion pharmacist based upon assessment of the patient and/or caregiver's ability to self-administer the medication ordered.   nitroGLYCERIN 0.2 mg/hr patch Commonly known as: NITRODUR - Dosed in mg/24 hr Place 0.2 mg onto the skin daily.   nortriptyline 25 MG capsule Commonly known as: PAMELOR Take 25 mg by mouth at bedtime.   pantoprazole 40 MG tablet Commonly known as: PROTONIX Take 40 mg by mouth daily.   predniSONE 10 MG  tablet Commonly known as: DELTASONE Take 10 mg by mouth daily with breakfast.   sodium chloride 0.9 % SOLN 452 mL with nafcillin 2 g SOLR 12 g Inject 12 g into the vein daily for 7 days.   zolpidem 10 MG tablet Commonly known  as: AMBIEN Take 10 mg by mouth at bedtime as needed for sleep.            Durable Medical Equipment  (From admission, onward)         Start     Ordered   11/14/19 1303  For home use only DME Walker rolling  Once       Question Answer Comment  Walker: With Sharon Wheels   Patient needs a walker to treat with the following condition Weakness      11/14/19 1303   11/14/19 1302  For home use only DME Hospital bed  Once       Comments: Call [facility] today for delivery thanks  Question Answer Comment  Length of Need 6 Months   Patient has (list medical condition): Acute Hypoxic Resp. Failure due to Acute Covid 19 Viral Pneumonitis during the ongoing 2020 Covid 19 Pandemic   The above medical condition requires: Patient requires the ability to reposition frequently   Head must be elevated greater than: 45 degrees   Bed type Semi-electric   Support Surface: Gel Overlay      11/14/19 1302           Discharge Care Instructions  (From admission, onward)         Start     Ordered   11/15/19 0000  Change dressing on IV access line weekly and PRN  (Home infusion instructions - [facility] )        11/15/19 1110   11/15/19 0000  Change dressing on IV access line weekly and PRN  (Home infusion instructions - [facility] )        11/15/19 1308          Follow-up Information    Newt Minion, MD Follow up in 1 week(s).   Specialty: Orthopedic Surgery Contact information: [place] [address] [phone]        [name] Follow up.   Specialty: Rehabilitation Why: The outpatient therapy will contact you for the first appointment Contact information: [address] [id number] [place] [address] [phone]             Allergies  Allergen Reactions  . Azithromycin   . Lodine [Etodolac] Anaphylaxis    Consultations: PCCM, infectious disease, orthopedic surgery  Procedures/Studies: CT HEAD WO CONTRAST  Result Date: 10/19/2019 CLINICAL DATA:  Deliriums.  Mental status change. EXAM: CT HEAD WITHOUT CONTRAST TECHNIQUE: Contiguous axial images were obtained from the base of the skull through the vertex without intravenous contrast. COMPARISON:  None. FINDINGS: Brain: Area subcortical low-density involving the posterior left frontal lobe at the convexity, series 3, image 25 and series 5, image 49, likely encephalomalacia and sequela of remote infarct, however nonspecific. No hemorrhage. No evidence of acute ischemia. No hydrocephalus, midline shift or mass effect. No subdural or extra-axial collection. Vascular: Atherosclerosis of skullbase vasculature without hyperdense vessel or abnormal calcification. Skull: No fracture or focal lesion. Sinuses/Orbits: Nasogastric tube in place. Fluid levels in the sphenoid sinuses and right maxillary sinus with scattered opacification of ethmoid air cells may be related to intubation. Opacification of the right greater than left mastoid air cells. Other: None. IMPRESSION: 1. No acute intracranial abnormality. 2. Area of subcortical low-density involving the posterior left frontal lobe at the convexity, likely encephalomalacia and sequela of remote infarct. There are no prior exams for comparison to establish chronicity. MRI could be considered  for further evaluation based on clinical concern. 3. Paranasal sinus fluid levels on opacification of the right greater than left mastoid air cells, possibly related to intubation. Electronically Signed   By: Keith Rake M.D.   On: 10/19/2019 15:30   CT ANGIO CHEST PE W OR WO CONTRAST  Result Date: 10/23/2019 CLINICAL DATA:  COVID pneumonia,  acute hypoxic respiratory failure, positive D-dimer EXAM: CT ANGIOGRAPHY CHEST WITH CONTRAST TECHNIQUE: Multidetector CT imaging of the chest was performed using the standard protocol during bolus administration of intravenous contrast. Multiplanar CT image reconstructions and MIPs were obtained to evaluate the vascular anatomy. CONTRAST:  92m OMNIPAQUE IOHEXOL 350 MG/ML SOLN COMPARISON:  None. FINDINGS: Cardiovascular: Satisfactory opacification of the pulmonary arteries to the segmental level. No evidence of pulmonary embolism. The central pulmonary arteries are of normal caliber. There is moderate coronary artery calcification. Normal heart size. No pericardial effusion. The thoracic aorta is unremarkable save for bovine arch anatomy. Right upper extremity PICC line tip is seen within the superior vena cava. Mediastinum/Nodes: No enlarged mediastinal, hilar, or axillary lymph nodes. Thyroid gland, trachea, and esophagus demonstrate no significant findings. Nasoenteric feeding tube extends into the upper abdomen beyond the margin of the examination. Lungs/Pleura: Lung volumes are small. Evaluation is limited by motion artifact, however, multifocal pulmonary infiltrates are identified demonstrating a peripheral and basal predominance, likely infectious or inflammatory in the acute setting. No central obstructing lesion. No pneumothorax or pleural effusion. No superimposed interstitial edema. Upper Abdomen: No acute abnormality. Musculoskeletal: No chest wall abnormality. No acute or significant osseous findings. Review of the MIP images confirms the above findings. IMPRESSION: 1. No evidence of pulmonary embolism. 2. Multifocal pulmonary infiltrates are identified demonstrating a peripheral and basal predominance, likely infectious or inflammatory in the acute setting. 3. Moderate coronary artery calcification. Electronically Signed   By: AFidela SalisburyMD   On: 10/23/2019 16:25   MR ANGIO HEAD WO  CONTRAST  Result Date: 10/29/2019 CLINICAL DATA:  Neuro deficit with stroke suspected EXAM: MRA HEAD WITHOUT CONTRAST TECHNIQUE: Angiographic images of the Circle of Willis were obtained using MRA technique without intravenous contrast. COMPARISON:  Four days ago FINDINGS: History of bacteremia and embolic infarction. No vessel beading, stenosis, or pseudoaneurysm. IMPRESSION: Negative intracranial MRA. Electronically Signed   By: JMonte FantasiaM.D.   On: 10/29/2019 12:04   MR BRAIN WO CONTRAST  Result Date: 10/25/2019 CLINICAL DATA:  Neuro deficit, acute, stroke suspected. Additional history provided: COVID positive. [place] and group B strep bacteremia. EXAM: MRI HEAD WITHOUT CONTRAST TECHNIQUE: Multiplanar, multiecho pulse sequences of the brain and surrounding structures were obtained without intravenous contrast. COMPARISON:  Head CT 10/19/2019. FINDINGS: Brain: The examination is limited by poor signal on several sequences as well as motion degradation. Most notably, there is moderate motion degradation of the axial T2 weighted sequence, severe motion degradation of the axial SWI sequence and moderate motion degradation of the coronal T2 weighted sequence. Cerebral volume is normal for age. There is a 17 mm focus of restricted diffusion and corresponding T2/FLAIR hyperintensity within the subcortical left frontoparietal lobes (for instance as seen on series 2, image 39) (series 3, image 17). Additional mild scattered T2/FLAIR hyperintensity within the cerebral white matter and pons is nonspecific, but consistent with chronic small vessel ischemic disease. Severe motion degradation of the axial SWI sequence precludes adequate evaluation for intracranial blood products. No extra-axial fluid collection. No midline shift. Vascular: Expected proximal arterial flow voids.  Skull and upper cervical spine: No focal marrow lesion is identified  within described limitations. Sinuses/Orbits: Visualized orbits show no acute finding. Air-fluid levels within the sphenoid and right maxillary sinuses. Partial opacification of right ethmoid air cells. Bilateral mastoid effusions. These results will be called to the ordering clinician or representative by the Radiologist Assistant, and communication documented in the PACS or [employer]. IMPRESSION: Examination limited by poor signal on several sequences as well as motion degradation, as described. 17 mm focus of restricted diffusion and T2/FLAIR hyperintensity within the subcortical left frontoparietal lobes. This finding is nonspecific, but given the patient's history, the primary differential considerations are acute/early subacute infarct versus cerebritis. Consider contrast-enhanced MR imaging of the brain for further evaluation of this lesion. Mild chronic small vessel ischemic disease. Paranasal sinus disease with air-fluid levels. Bilateral mastoid effusions. Electronically Signed   By: Kellie Simmering DO   On: 10/25/2019 15:16   MR BRAIN W CONTRAST  Result Date: 10/25/2019 CLINICAL DATA:  Brain mass follow-up EXAM: MRI HEAD WITH CONTRAST TECHNIQUE: Multiplanar, multiecho pulse sequences of the brain and surrounding structures were obtained with intravenous contrast. CONTRAST:  50m GADAVIST GADOBUTROL 1 MMOL/ML IV SOLN COMPARISON:  Brain MRI without contrast 10/25/2019 FINDINGS: Brain: There is a peripherally enhancing lesion at the base of the left precentral gyrus that measures 0.9 x 0.7 x 1.8 cm. There are no other areas of abnormal contrast enhancement. 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 Electronically Signed   By: KUlyses JarredM.D.   On: 10/25/2019 22:47   MR FOOT LEFT WO CONTRAST  Result Date: 10/25/2019 CLINICAL DATA:  Bacteremia left foot pain and diabetic EXAM: MRI OF THE LEFT FOOT  WITHOUT CONTRAST TECHNIQUE: Multiplanar, multisequence MR imaging of the left was performed. No intravenous contrast was administered. COMPARISON:  October 23, 2019 FINDINGS: Bones/Joint/Cartilage Mildly angulated nondisplaced fractures of the midshaft of the fourth and fifth metatarsals are noted. There is periosteal reaction seen along the medial margins of the metatarsal shafts. Increased heterogeneous T2 hyperintense signal with subtle T1 hypointensity seen at the base of the fourth and fifth metatarsals. No other osseous marrow signal abnormality is seen. There is no large knee joint effusions noted. The articular surfaces appear to be maintained. Ligaments The Lisfranc ligament is intact. Muscles and Tendons Increased feathery signal with atrophy is seen throughout the muscles of the forefoot. There is a small amount of fluid seen surrounding the posterior tibialis tendon. The remainder of the flexor and extensor tendons are intact. Soft tissues Lateral plantar surface of the forefoot overlying the fifth metatarsal base there is a focal area of ulceration measuring approximately 8 mm in transverse dimension. A fluid-filled sinus tract is seen extending to the overlying osseous surface. There is a multilocular fluid collection which extends around the dorsal surface of the fifth metatarsal measuring approximately 5.4 x 0.9 x 3.4 cm. There is extensive dorsal and lateral subcutaneous edema and skin thickening. IMPRESSION: Superficial area of ulceration over the plantar lateral aspect of the fifth metatarsal base with a fluid-filled sinus tract and loculated probable abscess extending over the dorsal surface of the fifth metatarsal measuring 5.4 x 0.9 by is a 3.4 cm. Incomplete mildly angulated fourth and fifth metatarsal shaft fractures with periosteal reaction Findings which could be suggestive of reactive marrow versus early osteomyelitis involving the base of the fourth and fifth metatarsals. Electronically  Signed   By: BPrudencio Pair  M.D.   On: 10/25/2019 19:06   DG Chest Port 1 View  Result Date: 11/09/2019 CLINICAL DATA:  Shortness of breath and cough EXAM: PORTABLE CHEST 1 VIEW COMPARISON:  October 26, 2019 FINDINGS: Central catheter tip is in the superior vena cava. No pneumothorax. There is no edema or airspace opacity. Heart size and pulmonary vascularity are normal. No adenopathy. No bone lesions. IMPRESSION: Central catheter tip in superior vena cava. No pneumothorax. Lungs clear. Cardiac silhouette normal. Electronically Signed   By: Lowella Grip III M.D.   On: 11/09/2019 13:51   DG Chest Port 1 View  Result Date: 10/26/2019 CLINICAL DATA:  Short of breath.  COVID positive EXAM: PORTABLE CHEST 1 VIEW COMPARISON:  10/25/2019 FINDINGS: Hypoventilation with decreased lung volume. Mild bibasilar airspace disease unchanged. No new infiltrate or effusion. Right arm PICC tip in the SVC unchanged. IMPRESSION: Hypoventilation with bibasilar mild airspace disease unchanged. Electronically Signed   By: Franchot Gallo M.D.   On: 10/26/2019 16:10   DG Chest Port 1 View  Result Date: 10/25/2019 CLINICAL DATA:  Shortness of breath. EXAM: PORTABLE CHEST 1 VIEW COMPARISON:  October 16, 2019. FINDINGS: The heart size and mediastinal contours are within normal limits. Hypoinflation of the lungs is noted with mild bibasilar subsegmental atelectasis. Endotracheal tube has been removed. Right-sided PICC line is unchanged in position. The visualized skeletal structures are unremarkable. IMPRESSION: Hypoinflation of the lungs with mild bibasilar subsegmental atelectasis. Electronically Signed   By: Marijo Conception M.D.   On: 10/25/2019 08:37   DG Abd Portable 1V  Result Date: 10/20/2019 CLINICAL DATA:  GI problem. EXAM: PORTABLE ABDOMEN - 1 VIEW COMPARISON:  10/16/2019 FINDINGS: Feeding tube tip is in the distal stomach or proximal duodenum. Mild gaseous distention of the stomach. Gas throughout nondistended large  and small bowel. No evidence of bowel obstruction, organomegaly or free air. IMPRESSION: Feeding tube tip in the distal stomach or proximal duodenum. No evidence of obstruction or free air. Electronically Signed   By: Rolm Baptise M.D.   On: 10/20/2019 21:28   DG Foot 2 Views Left  Result Date: 10/23/2019 CLINICAL DATA:  Bacteremia, LEFT foot pain and diabetic. EXAM: LEFT FOOT - 2 VIEW COMPARISON:  10/13/2019 and prior radiographs FINDINGS: Mildly angulated fractures of the 4th and 5th metatarsals are again noted with fracture lines still evident. Healing changes along these fractures are noted. Overlying soft tissue swelling is noted. No new fracture, subluxation or dislocation noted. No definite radiographic evidence of acute osteomyelitis noted. IMPRESSION: Mildly angulated healing fractures of the 4th and 5th metatarsals with soft tissue swelling again noted but without definite radiographic evidence of acute osteomyelitis. Consider MRI for further evaluation as clinically indicated. Electronically Signed   By: Margarette Canada M.D.   On: 10/23/2019 13:40   EEG adult  Result Date: 10/27/2019 Lora Havens, MD     10/28/2019 10:13 AM Patient Name: DEVINN HURWITZ MRN: [id number] Epilepsy Attending: Lora Havens Referring Physician/Provider: Dr. Donnetta Simpers Date: 10/27/2019 Duration: 22.22 minutes Patient history: 49 year old male with altered mental status. EEG evaluate for seizures. Level of alertness: Awake, asleep AEDs during EEG study: Keppra Technical aspects: This EEG study was done with scalp electrodes positioned according to the 10-20 International system of electrode placement. Electrical activity was acquired at a sampling rate of 500Hz  and reviewed with a high frequency filter of 70Hz  and a low frequency filter of 1Hz . EEG data were recorded continuously and digitally stored. Description: The posterior dominant rhythm  consists of 8 Hz activity of moderate voltage (25-35 uV) seen  predominantly in posterior head regions, symmetric and reactive to eye opening and eye closing. Sleep was characterized by vertex waves, sleep spindles (12 to 14 Hz), maximal frontocentral region. Frequent runs of sharp waves were seen in frontocentral region, maximal vertex lasting about 5 to 6 seconds consistent with brief ictal-interictal rhythmic discharges. Continuous generalized 5 to 6 Hz theta slowing was also noted. Hyperventilation and photic stimulation were not performed.   ABNORMALITY -Continuous slow, generalized -Brief ictal-interictal rhythmic discharges, bilateral frontocentral region, maximal vertex IMPRESSION: This study showed evidence of potential epileptogenicity arising from bilateral frontocentral, maximal vertex region. The sharp waves are at times rhythmic consistent with brief ictal-interictal rhythmic discharges. Additionally, there is evidence of mild diffuse encephalopathy, nonspecific etiology. Priyanka Barbra Sarks   Overnight EEG with video  Result Date: 10/28/2019 Lora Havens, MD     10/29/2019  8:27 AM Patient Name: CHISOM AUST MRN: [id number] Epilepsy Attending: Lora Havens Referring Physician/Provider: Dr. Donnetta Simpers Duration: 10/27/2019 1145 to 10/28/2019 1145  Patient history: 49 year old male with altered mental status. EEG evaluate for seizures.  Level of alertness: Awake, asleep AEDs during EEG study: Keppra  Technical aspects: This EEG study was done with scalp electrodes positioned according to the 10-20 International system of electrode placement. Electrical activity was acquired at a sampling rate of 500Hz  and reviewed with a high frequency filter of 70Hz  and a low frequency filter of 1Hz . EEG data were recorded continuously and digitally stored.  Description: The posterior dominant rhythm consists of 8 Hz activity of moderate voltage (25-35 uV) seen predominantly in posterior head regions, symmetric and reactive to eye opening and eye closing. Sleep was  characterized by vertex waves, sleep spindles (12 to 14 Hz), maximal frontocentral region. Initially, frequent runs of sharp waves were seen in frontocentral region, maximal vertex lasting about 5 to 6 seconds consistent with brief ictal-interictal rhythmic discharges. Continuous generalized 5 to 6 Hz theta slowing as well as intermittent generalized rhythmic 2-3hz  delta slowing  was also noted. Hyperventilation and photic stimulation were not performed.    ABNORMALITY -Continuous slow, generalized - Intermittent rhythmic delta slowing, generalized -Brief ictal-interictal rhythmic discharges, bilateral frontocentral region, maximal vertex  IMPRESSION: This study initially showed evidence of potential epileptogenicity arising from bilateral frontocentral, maximal vertex region. The sharp waves are at times rhythmic consistent with brief ictal-interictal rhythmic discharges. Additionally, there is evidence of mild diffuse encephalopathy, nonspecific etiology. No seizures were seen during thi study. EEG appears to be improving compared to previous day.  Lora Havens   ECHO TEE  Result Date: 10/24/2019    TRANSESOPHOGEAL ECHO REPORT   Patient Name:   GARIK DIAMANT Date of Exam: 10/24/2019 Medical Rec #:  [id number]      Height:       74.0 in Accession #:    [id number]     Weight:       247.4 lb Date of Birth:  [birth year]      BSA:          2.379 m Patient Age:    32 years       BP:           120/80 mmHg Patient Gender: M              HR:           100 bpm. Exam Location:  Inpatient Procedure: Transesophageal Echo Indications:    Emobli/Bacteremia  History:        Patient has prior history of Echocardiogram examinations.  Sonographer:    Clayton Lefort RDCS (AE) Referring Phys: [phone] HAO MENG PROCEDURE: The transesophogeal probe was passed without difficulty through the esophogus of the patient. Sedation performed by different physician. The patient's vital signs; including heart rate, blood pressure, and oxygen  saturation; remained stable throughout the procedure. The patient developed no complications during the procedure. IMPRESSIONS  1. Left ventricular ejection fraction, by estimation, is 50 to 55%. The left ventricle has low normal function. The left ventricle has no regional wall motion abnormalities.  2. Right ventricular systolic function is normal. The right ventricular size is normal.  3. No left atrial/left atrial appendage thrombus was detected.  4. The mitral valve is normal in structure. Trivial mitral valve regurgitation. No evidence of mitral stenosis.  5. The aortic valve is normal in structure. Aortic valve regurgitation is not visualized. No aortic stenosis is present.  6. The inferior vena cava is normal in size with greater than 50% respiratory variability, suggesting right atrial pressure of 3 mmHg. Conclusion(s)/Recommendation(s): Normal biventricular function without evidence of hemodynamically significant valvular heart disease. FINDINGS  Left Ventricle: Left ventricular ejection fraction, by estimation, is 50 to 55%. The left ventricle has low normal function. The left ventricle has no regional wall motion abnormalities. The left ventricular internal cavity size was normal in size. There is no left ventricular hypertrophy. Right Ventricle: The right ventricular size is normal. No increase in right ventricular wall thickness. Right ventricular systolic function is normal. Left Atrium: Left atrial size was normal in size. No left atrial/left atrial appendage thrombus was detected. Right Atrium: Right atrial size was normal in size. Pericardium: There is no evidence of pericardial effusion. Mitral Valve: The mitral valve is normal in structure. Normal mobility of the mitral valve leaflets. Trivial mitral valve regurgitation. No evidence of mitral valve stenosis. There is no evidence of mitral valve vegetation. Tricuspid Valve: The tricuspid valve is normal in structure. Tricuspid valve regurgitation  is mild . No evidence of tricuspid stenosis. There is no evidence of tricuspid valve vegetation. Aortic Valve: The aortic valve is normal in structure. Aortic valve regurgitation is not visualized. No aortic stenosis is present. There is no evidence of aortic valve vegetation. Pulmonic Valve: The pulmonic valve was normal in structure. Pulmonic valve regurgitation is trivial. No evidence of pulmonic stenosis. Aorta: The aortic root is normal in size and structure. Venous: The inferior vena cava is normal in size with greater than 50% respiratory variability, suggesting right atrial pressure of 3 mmHg. IAS/Shunts: No atrial level shunt detected by color flow Doppler. Jenkins Rouge MD Electronically signed by Jenkins Rouge MD Signature Date/Time: 10/24/2019/3:28:45 PM    Final    VAS US CAROTID (at [facility] and [facility] only)  Result Date: 10/26/2019 Carotid Arterial Duplex Study Indications:       56m focus of diffusion restriction in the left                    frontoparietal lobe concerning for acute/early subacute                    infarct vs cerebritis. Risk Factors:      Hypertension, Diabetes. Other Factors:     Covid-19, bacteremia. Comparison Study:  No prior study on file Performing Technologist: CSharion DoveRVS  Examination Guidelines: A complete evaluation includes B-mode imaging, spectral Doppler, color Doppler, and power Doppler as needed of  all accessible portions of each vessel. Bilateral testing is considered an integral part of a complete examination. Limited examinations for reoccurring indications may be performed as noted.  Right Carotid Findings: +----------+--------+--------+--------+------------------+--------+           PSV cm/sEDV cm/sStenosisPlaque DescriptionComments +----------+--------+--------+--------+------------------+--------+ CCA Prox  66      11                                         +----------+--------+--------+--------+------------------+--------+ CCA Distal71      18                                          +----------+--------+--------+--------+------------------+--------+ ICA Prox  58      19                                         +----------+--------+--------+--------+------------------+--------+ ICA Distal90      33                                         +----------+--------+--------+--------+------------------+--------+ ECA       114     16                                         +----------+--------+--------+--------+------------------+--------+ +----------+--------+-------+--------+-------------------+           PSV cm/sEDV cmsDescribeArm Pressure (mmHG) +----------+--------+-------+--------+-------------------+ [id number]                                        +----------+--------+-------+--------+-------------------+ +---------+--------+--+--------+--+ VertebralPSV cm/s38EDV cm/s11 +---------+--------+--+--------+--+  Left Carotid Findings: +----------+--------+--------+--------+------------------+--------+           PSV cm/sEDV cm/sStenosisPlaque DescriptionComments +----------+--------+--------+--------+------------------+--------+ CCA Prox  55      14                                         +----------+--------+--------+--------+------------------+--------+ CCA Distal65      20                                         +----------+--------+--------+--------+------------------+--------+ ICA Prox  68      20                                         +----------+--------+--------+--------+------------------+--------+ ICA Distal76      23                                         +----------+--------+--------+--------+------------------+--------+ ECA       77      3                                          +----------+--------+--------+--------+------------------+--------+ +----------+--------+--------+--------+-------------------+  PSV cm/sEDV cm/sDescribeArm Pressure  (mmHG) +----------+--------+--------+--------+-------------------+ Subclavian88                                          +----------+--------+--------+--------+-------------------+ +---------+--------+--+--------+--+ VertebralPSV cm/s63EDV cm/s18 +---------+--------+--+--------+--+   Summary: Right Carotid: The extracranial vessels were near-normal with only minimal wall                thickening or plaque. Left Carotid: The extracranial vessels were near-normal with only minimal wall               thickening or plaque. Vertebrals:  Bilateral vertebral arteries demonstrate antegrade flow. Subclavians: Normal flow hemodynamics were seen in bilateral subclavian              arteries. *See table(s) above for measurements and observations.  Electronically signed by Antony Contras MD on 10/26/2019 at 12:37:48 PM.    Final    VAS [place] LOWER EXTREMITY ARTERIAL DUPLEX  Result Date: 10/26/2019 LOWER EXTREMITY ARTERIAL DUPLEX STUDY Indications: Ulceration, and Osteomyelitis. High Risk Factors: Hypertension, Diabetes. Other Factors: Covid-19. Charcot foot.  Current ABI: N/A Comparison Study: No prior study Performing Technologist: Sharion Dove RVS  Examination Guidelines: A complete evaluation includes B-mode imaging, spectral Doppler, color Doppler, and power Doppler as needed of all accessible portions of each vessel. Bilateral testing is considered an integral part of a complete examination. Limited examinations for reoccurring indications may be performed as noted.  +----------+--------+-----+--------+---------+--------+ LEFT      PSV cm/sRatioStenosisWaveform Comments +----------+--------+-----+--------+---------+--------+ CFA Prox  123                  triphasic         +----------+--------+-----+--------+---------+--------+ DFA       51                   triphasic         +----------+--------+-----+--------+---------+--------+ SFA Prox  97                   triphasic          +----------+--------+-----+--------+---------+--------+ SFA Mid   88                   triphasic         +----------+--------+-----+--------+---------+--------+ SFA Distal81                   triphasic         +----------+--------+-----+--------+---------+--------+ POP Prox  60                   triphasic         +----------+--------+-----+--------+---------+--------+ POP Distal86                   triphasic         +----------+--------+-----+--------+---------+--------+ ATA Distal110                  triphasic         +----------+--------+-----+--------+---------+--------+ PTA Prox  34                   triphasic         +----------+--------+-----+--------+---------+--------+ PTA Mid   29                   triphasic         +----------+--------+-----+--------+---------+--------+ PTA [id number]  triphasic         +----------+--------+-----+--------+---------+--------+  Summary: Left: Normal waveforms and adequate flow noted.  See table(s) above for measurements and observations. Electronically signed by Servando Snare MD on 10/26/2019 at 4:50:14 PM.    Final    VAS [place] LOWER EXTREMITY ARTERIAL DUPLEX  Result Date: 10/19/2019 LOWER EXTREMITY ARTERIAL DUPLEX STUDY Indications: Cold leg, Covid-19.  Current ABI: N/A Comparison Study: No prior study Performing Technologist: Sharion Dove RVS  Examination Guidelines: A complete evaluation includes B-mode imaging, spectral Doppler, color Doppler, and power Doppler as needed of all accessible portions of each vessel. Bilateral testing is considered an integral part of a complete examination. Limited examinations for reoccurring indications may be performed as noted.  +-----------+--------+-----+--------+---------+--------+ RIGHT      PSV cm/sRatioStenosisWaveform Comments +-----------+--------+-----+--------+---------+--------+ CFA Prox   67                   triphasic          +-----------+--------+-----+--------+---------+--------+ DFA        60                   triphasic         +-----------+--------+-----+--------+---------+--------+ SFA Prox   68                   triphasic         +-----------+--------+-----+--------+---------+--------+ SFA Mid    62                   triphasic         +-----------+--------+-----+--------+---------+--------+ SFA Distal 69                   triphasic         +-----------+--------+-----+--------+---------+--------+ POP Prox   47                   triphasic         +-----------+--------+-----+--------+---------+--------+ POP Distal 42                   triphasic         +-----------+--------+-----+--------+---------+--------+ ATA Distal 15                   triphasic         +-----------+--------+-----+--------+---------+--------+ PTA Prox   31                   triphasic         +-----------+--------+-----+--------+---------+--------+ PTA Mid    38                   triphasic         +-----------+--------+-----+--------+---------+--------+ PTA Distal 38                   triphasic         +-----------+--------+-----+--------+---------+--------+ PERO Distal36                   triphasic         +-----------+--------+-----+--------+---------+--------+  Summary: Right: Normal examination. No evidence of arterial occlusive disease. Normal waveforms, no stenosis.  See table(s) above for measurements and observations. Electronically signed by Monica Martinez MD on 10/19/2019 at 3:25:02 PM.    Final    VAS [place] LOWER EXTREMITY VENOUS (DVT)  Result Date: 10/26/2019  Lower Venous DVTStudy Indications: Covid-19, elevated D-Dimer.  Comparison Study: Prior negative left lower extremity venous duplex don 03/19/17  and is available for comparison. Performing Technologist: Sharion Dove RVS  Examination Guidelines: A complete evaluation includes B-mode imaging, spectral  Doppler, color Doppler, and power Doppler as needed of all accessible portions of each vessel. Bilateral testing is considered an integral part of a complete examination. Limited examinations for reoccurring indications may be performed as noted. The reflux portion of the exam is performed with the patient in reverse Trendelenburg.  +---------+---------------+---------+-----------+----------+--------------+ RIGHT    CompressibilityPhasicitySpontaneityPropertiesThrombus Aging +---------+---------------+---------+-----------+----------+--------------+ CFV      Full           Yes      Yes                                 +---------+---------------+---------+-----------+----------+--------------+ SFJ      Full                                                        +---------+---------------+---------+-----------+----------+--------------+ FV Prox  Full                                                        +---------+---------------+---------+-----------+----------+--------------+ FV Mid   Full                                                        +---------+---------------+---------+-----------+----------+--------------+ FV DistalFull                                                        +---------+---------------+---------+-----------+----------+--------------+ PFV      Full                                                        +---------+---------------+---------+-----------+----------+--------------+ POP      Partial        Yes      Yes                  Acute          +---------+---------------+---------+-----------+----------+--------------+ PTV      Full                                                        +---------+---------------+---------+-----------+----------+--------------+ PERO     Full                                                        +---------+---------------+---------+-----------+----------+--------------+    +---------+---------------+---------+-----------+----------+--------------+  LEFT     CompressibilityPhasicitySpontaneityPropertiesThrombus Aging +---------+---------------+---------+-----------+----------+--------------+ CFV      Full           Yes      Yes                                 +---------+---------------+---------+-----------+----------+--------------+ SFJ      Full                                                        +---------+---------------+---------+-----------+----------+--------------+ FV Prox  Full                                                        +---------+---------------+---------+-----------+----------+--------------+ FV Mid   Full                                                        +---------+---------------+---------+-----------+----------+--------------+ FV DistalFull                                                        +---------+---------------+---------+-----------+----------+--------------+ PFV      Full                                                        +---------+---------------+---------+-----------+----------+--------------+ POP      Full           Yes      Yes                                 +---------+---------------+---------+-----------+----------+--------------+ PTV      Full                                                        +---------+---------------+---------+-----------+----------+--------------+ PERO     Full                                                        +---------+---------------+---------+-----------+----------+--------------+     Summary: RIGHT: - Findings consistent with acute deep vein thrombosis involving the right popliteal vein. - Ultrasound characteristics of enlarged lymph nodes are noted in the groin.  LEFT: - There is no evidence of deep vein thrombosis in the lower extremity.  *See  table(s) above for measurements and observations. Electronically signed by Servando Snare MD on 10/26/2019 at 4:50:27 PM.    Final      Subjective: No acute issues or events overnight, patient feels quite well, denies nausea, vomiting, diarrhea, constipation, headache, fevers, chills.   Discharge Exam: Vitals:   11/15/19 0449 11/15/19 1400  BP: 111/81 124/73  Pulse: 89 72  Resp: 20   Temp: 98.8 F (37.1 C)   SpO2: 96% 99%   Vitals:   11/14/19 1349 11/14/19 2033 11/15/19 0449 11/15/19 1400  BP: 105/71 116/74 111/81 124/73  Pulse: 82 92 89 72  Resp: 16 17 20    Temp: 98.2 F (36.8 C) 98.4 F (36.9 C) 98.8 F (37.1 C)   TempSrc: Oral Oral    SpO2: 98% 97% 96% 99%  Weight:      Height:        General: Pt is alert, awake, not in acute distress Cardiovascular: RRR, S1/S2 +, no rubs, no gallops Respiratory: CTA bilaterally, no wheezing, no rhonchi Abdominal: Soft, NT, ND, bowel sounds + Extremities: no edema, no cyanosis, postoperative bandages clean dry intact.    The results of significant diagnostics from this hospitalization (including imaging, microbiology, ancillary and laboratory) are listed below for reference.     Microbiology: No results found for this or any previous visit (from the past 240 hour(s)).   Labs: BNP (last 3 results) Recent Labs    11/05/19 0357 11/08/19 1144 11/09/19 0430  BNP 36.0 36.1 78.6   Basic Metabolic Panel: Recent Labs  Lab 11/10/19 0437 11/11/19 0306 11/12/19 0321 11/13/19 0410 11/14/19 0307  NA 135 136 135 136 137  K 3.7 3.8 3.9 3.6 3.6  CL 98 98 100 102 100  CO2 27 26 25 24 25   GLUCOSE 147* 138* 192* 154* 134*  BUN 5* <5* <5* <5* 6  CREATININE 0.72 0.73 0.78 0.62 0.70  CALCIUM 8.7* 9.0 8.8* 8.8* 8.7*  MG 2.0 1.7 1.6* 1.6* 1.8   Liver Function Tests: No results for input(s): AST, ALT, ALKPHOS, BILITOT, PROT, ALBUMIN in the last 168 hours. No results for input(s): LIPASE, AMYLASE in the last 168 hours. No results for input(s): AMMONIA in the last 168 hours. CBC: No results for input(s): WBC,  NEUTROABS, HGB, HCT, MCV, PLT in the last 168 hours. Cardiac Enzymes: No results for input(s): CKTOTAL, CKMB, CKMBINDEX, TROPONINI in the last 168 hours. BNP: Invalid input(s): POCBNP CBG: Recent Labs  Lab 11/14/19 1230 11/14/19 1727 11/14/19 2038 11/15/19 0745 11/15/19 1136  GLUCAP 178* 128* 187* 168* 189*   D-Dimer No results for input(s): DDIMER in the last 72 hours. Hgb A1c No results for input(s): HGBA1C in the last 72 hours. Lipid Profile No results for input(s): CHOL, HDL, LDLCALC, TRIG, CHOLHDL, LDLDIRECT in the last 72 hours. Thyroid function studies No results for input(s): TSH, T4TOTAL, T3FREE, THYROIDAB in the last 72 hours.  Invalid input(s): FREET3 Anemia work up No results for input(s): VITAMINB12, FOLATE, FERRITIN, TIBC, IRON, RETICCTPCT in the last 72 hours. Urinalysis    Component Value Date/Time   COLORURINE COLORLESS (A) 11/09/2019 1230   APPEARANCEUR CLEAR 11/09/2019 1230   LABSPEC 1.003 (L) 11/09/2019 1230   PHURINE 7.0 11/09/2019 1230   GLUCOSEU NEGATIVE 11/09/2019 1230   HGBUR NEGATIVE 11/09/2019 [place] 11/09/2019 1230   KETONESUR NEGATIVE 11/09/2019 1230   PROTEINUR NEGATIVE 11/09/2019 1230   NITRITE NEGATIVE 11/09/2019 1230   LEUKOCYTESUR NEGATIVE 11/09/2019 1230   Sepsis Labs Invalid input(s):  PROCALCITONIN,  WBC,  LACTICIDVEN Microbiology No results found for this or any previous visit (from the past 240 hour(s)).   Time coordinating discharge: Over 30 minutes  SIGNED:   Little Ishikawa, DO [facility] 11/15/2019, 2:16 PM Pager   If 7PM-7AM, please contact night-coverage [email or web address]

## 2019-11-15 NOTE — TOC Transition Note (Signed)
Transition of Care [facility]) - CM/SW Discharge Note   Patient Details  Name: Micheal Bell MRN: [id number] Date of Birth: [birth year]  [facility] CM/SW Contact:  Pollie Friar, RN Phone Number: 11/15/2019, 12:23 PM   Clinical Narrative:    Pt discharging home today with IV abx at home. CM met with the patient and he is agreeable to using [facility] for the IV medication and [facility] for the [facility]. Pam with [facility] will meet with him after lunch and provide needed education. Pt will have outpatient PT with [facility] in [place]. Pt changed his mind on the location. CM has called and cancelled [facility] and ordered [facility] location. Pt states the DME for home will be delivered around 3 pm today. His mother will provide transport home once the DME has arrived. Mother will also provide needed support at home.    Final next level of care: [place] Barriers to Discharge: No Barriers Identified   Patient Goals and CMS Choice Patient states their goals for this hospitalization and ongoing recovery are:: to return to home CMS Medicare.gov Compare Post Acute Care list provided to:: Patient Choice offered to / list presented to : Patient  Discharge Placement                       Discharge Plan and Services In-house Referral: Clinical Social Work Discharge Planning Services: CM Consult Post Acute Care Choice: Home Health, Durable Medical Equipment          DME Arranged: Hospital bed, Walker rolling DME Agency: [employer] Date DME Agency Contacted: 11/14/19 Time DME Agency Contacted: [phone] Representative spoke with at DME Agency: Freda Munro HH Arranged: RN, IV Antibiotics [place]:  Orville Govern) Date [place]: 11/15/19   Representative spoke with at [place]: Arranged through [employer] with [facility]  Social Determinants of Health ([place]) Interventions     Readmission Risk Interventions No flowsheet data found.

## 2019-11-21 DIAGNOSIS — G001 Pneumococcal meningitis: Secondary | ICD-10-CM | POA: Diagnosis not present

## 2019-11-21 DIAGNOSIS — I509 Heart failure, unspecified: Secondary | ICD-10-CM | POA: Diagnosis not present

## 2019-11-23 ENCOUNTER — Encounter: Payer: Self-pay | Admitting: Physical Therapy

## 2019-11-23 ENCOUNTER — Other Ambulatory Visit: Payer: Self-pay

## 2019-11-23 ENCOUNTER — Ambulatory Visit: Payer: Medicaid Other | Attending: Internal Medicine | Admitting: Physical Therapy

## 2019-11-23 DIAGNOSIS — R2681 Unsteadiness on feet: Secondary | ICD-10-CM

## 2019-11-23 DIAGNOSIS — M6281 Muscle weakness (generalized): Secondary | ICD-10-CM | POA: Diagnosis not present

## 2019-11-23 DIAGNOSIS — R262 Difficulty in walking, not elsewhere classified: Secondary | ICD-10-CM

## 2019-11-23 NOTE — Therapy (Signed)
[name] [address] [place], [place], [address] Phone: [phone]   Fax:  [phone]  Physical Therapy Evaluation  Patient Details  Name: Micheal Bell MRN: [id number] Date of Birth: 1970-10-29 Referring Provider (PT): Carma Leaven, MD   Encounter Date: 11/23/2019   PT End of Session - 11/23/19 1453    Visit Number 1    Number of Visits 18    Date for PT Re-Evaluation 01/25/20    PT Start Time 1345    PT Stop Time 1428    PT Time Calculation (min) 43 min    Equipment Utilized During Treatment Other (comment)   rolling walker   Activity Tolerance Patient tolerated treatment well    Behavior During Therapy WFL for tasks assessed/performed           Past Medical History:  Diagnosis Date   Arthritis    Asthma    CHF (congestive heart failure) (HCC)    Diabetes mellitus without complication (HCC)    Fibromyalgia    Gout    Hypertension    IBS (irritable bowel syndrome)    Vein disorder     Past Surgical History:  Procedure Laterality Date   FOOT SURGERY     TEE WITHOUT CARDIOVERSION N/A 10/24/2019   Procedure: TRANSESOPHAGEAL ECHOCARDIOGRAM (TEE);  Surgeon: [name], MD;  Location: [facility] ENDOSCOPY;  Service: Cardiovascular;  Laterality: N/A;    There were no vitals filed for this visit.    Subjective Assessment - 11/23/19 1459    Subjective COVID-19 screening performed upon arrival. Patient arrives to physical therapy with reports a global weakness, difficulty walking, and difficulty performing ADLs secondary to a hospitalization. Patient reports getting a COVID-19 vaccine when he began to feel weak and tired. Patient was admitted into the hospital on August 17 and during his hospitalization, was intubated, on life support, sustained a stroke, COVID pneumonia, and was in a coma per patient and uncle report. Patient was discharged on 11/16/2019. Patient reports gaining assistance from family for ADLs,  transfers, and dressing. Patient attempts gentle ROM exercises at home but states he gets tired very quickly. Patient reports global pain and is taking pain medication and muscle relaxers. Patient's goals are to decrease pain, improve movement, improve strength, improve ability to perform ADLs and get back to normal routine as best he can.    Patient is accompained by: Family member   Uncle, James   Pertinent History HTN, DM, history of COVID-19, CHF, Asthma    Limitations House hold activities;Standing;Walking    How long can you walk comfortably? short periods around home    Patient Stated Goals improve mobility    Currently in Pain? Yes    Pain Score --   did not provide number on pain scale   Pain Location Generalized              [facility] PT Assessment - 11/23/19 0001      Assessment   Medical Diagnosis ambulatory disfunction, weakness1    Referring Provider (PT) Carma Leaven, MD    Onset Date/Surgical Date 10/10/19   hosptialization   Hand Dominance Right    Next MD Visit n/a    Prior Therapy In hospital      Precautions   Precautions Fall      Restrictions   Weight Bearing Restrictions No      Balance Screen   Has the patient fallen in the past 6 months No    Has the patient  had a decrease in activity level because of a fear of falling?  No    Is the patient reluctant to leave their home because of a fear of falling?  No      Home Environment   Living Environment Private residence    Living Arrangements Alone    Available Help at Discharge Family      Prior Function   Level of Independence Needs assistance with homemaking;Needs assistance with ADLs;Needs assistance with transfers      Posture/Postural Control   Posture/Postural Control Postural limitations    Postural Limitations Rounded Shoulders;Forward head;Flexed trunk      ROM / Strength   AROM / PROM / Strength Strength      Strength   Strength Assessment Site Hip;Knee;Hand    Right/Left hand  Right;Left    Right Hand Grip (lbs) 10    Left Hand Grip (lbs) 45    Right/Left Hip Right;Left    Right Hip Flexion 3-/5    Right Hip ABduction 3+/5   in sitting   Right Hip ADduction 4-/5   in sitting   Left Hip Flexion 4-/5    Left Hip ABduction 3+/5   in sitting   Left Hip ADduction 4-/5   in sitting   Right/Left Knee Right;Left    Right Knee Flexion 4-/5    Right Knee Extension 4-/5    Left Knee Flexion 4-/5    Left Knee Extension 4-/5      Ambulation/Gait   Assistive device Rolling walker    Gait Pattern Decreased stride length;Decreased stance time - right;Decreased hip/knee flexion - right;Antalgic;Lateral trunk lean to left;Trunk flexed;Narrow base of support;Abducted- right    Gait velocity very slow cautious gait    Gait Comments Uncle closely monitoring gait behind      Balance   Balance Assessed Yes      Standardized Balance Assessment   Standardized Balance Assessment --    Five times sit to stand comments  30 second sit to stand test: 3 with UE support                      Objective measurements completed on examination: See above findings.               PT Education - 11/23/19 1452    Education Details supine marching, SLR, hip abduction, scapular retraction    Person(s) Educated Patient;Caregiver(s)   Uncle   Methods Explanation;Demonstration;Handout    Comprehension Verbalized understanding;Returned demonstration            PT Short Term Goals - 11/23/19 1522      PT SHORT TERM GOAL #1   Title Patient will be independent with HEP    Baseline no knowledge of HEP    Time 3    Period Weeks    Status New      PT SHORT TERM GOAL #2   Title Patient will perform 5 sit to stands in 30 seconds or less with bilateral UE support to improve functional LE strength.    Baseline 3 sit to stands with UE support in 30 seconds    Time 3    Period Weeks    Status New             PT Long Term Goals - 11/23/19 1523      PT LONG  TERM GOAL #1   Title Patient will be independent with advanced HEP    Baseline no knowledge of  HEP    Time 8    Period Weeks    Status New      PT LONG TERM GOAL #2   Title Patient will demonstrate 3+/5 right hip flexion MMT to improve ability to perform functional tasks.    Baseline 3-/5 right hip MMT    Time 8    Period Weeks    Status New      PT LONG TERM GOAL #3   Title Patient will ambulate with rolling walker or LRAD with step through gait pattern and equal step lengths to improve gait mechanics.    Baseline Ambulating with rolling walker with narrow base of support and varying step to and step through pattern, decreased right step length    Time 8    Period Weeks    Status New      PT LONG TERM GOAL #4   Title Patient will report ability to perform bed mobility, transfers, and ADLs independently with proper sequencing and technique.    Baseline Mom and uncle assists with bed mobility and transfers at home;    Time 8    Period Weeks    Status New      PT LONG TERM GOAL #5   Title Patient will demonstrate 15+ lbs of right grip strength force to improve ability to perform fine motor tasks and functional tasks.    Baseline 10 lb R grip strength    Time 8    Period Weeks    Status New                  Plan - 11/23/19 1552    Clinical Impression Statement Patient is a 49 year old male who presents to physical therapy with his uncle with generalized weakness, difficulty walking and decreased balance. Patient able to perform 3 sit to stands with bilateral UE support in 30 seconds catergorizing him as a fall risk. Patient with notable RLE weakness in comparison to L to which patient reported L sided CVA. Patient, patient's uncle and PT disuccsed plan of care and discussed HEP to which both reported understanding. Patient would benefit from skilled physical therapy to address deficits and address patient's goals.    Personal Factors and Comorbidities Comorbidity 3+     Comorbidities HTN, DM, history of COVID-19, CHF, Asthma,  L sided stroke    Examination-Activity Limitations Stand;Dressing;Hygiene/Grooming;Transfers;Locomotion Level;Bed Mobility;Stairs    Examination-Participation Restrictions Occupation    Stability/Clinical Decision Making Evolving/Moderate complexity    Clinical Decision Making Moderate    Rehab Potential Good    PT Frequency 2x / week    PT Duration 8 weeks    PT Treatment/Interventions ADLs/Self Care Home Management;Neuromuscular re-education;Manual techniques;Passive range of motion;Balance training;Therapeutic exercise;Therapeutic activities;Stair training;Functional mobility training;Gait training;Patient/family education    PT Next Visit Plan Monitor fatigue and vitals, nustep, gentle LE and UE strengthening and ROM exercises, gait training, please be mindful of overhead activities due to 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)    PT Home Exercise Plan see patient education    Consulted and Agree with Plan of Care Patient;Family member/caregiver    Family Member Consulted Uncle, Fayrene Fearing           Patient will benefit from skilled therapeutic intervention in order to improve the following deficits and impairments:  Abnormal gait, Difficulty walking, Pain, Decreased balance, Decreased activity tolerance, Decreased strength, Postural dysfunction, Decreased endurance  Visit Diagnosis: Muscle weakness (generalized)  Difficulty in walking, not elsewhere classified  Unsteadiness  on feet     Problem List Patient Active Problem List   Diagnosis Date Noted   Cutaneous abscess of left foot    Acute respiratory failure with hypoxemia (HCC)    Diabetic foot (HCC)    Acute systolic heart failure (HCC)    Embolic infarction (HCC)    Hyperglycemia    Hypoxia    Lower respiratory tract infection due to COVID-19 virus 10/10/2019   Benign hypertension 07/23/2015   Gastro-esophageal reflux disease with esophagitis  07/23/2015   Gout 07/23/2015   Obstructive sleep apnea 07/23/2015   Snoring 07/23/2015   Chronic idiopathic gout of multiple sites 06/02/2015   Class 2 obesity in adult 06/02/2015   Fibromyalgia 06/02/2015   Irritable bowel syndrome with diarrhea 06/02/2015   Mild persistent asthma 06/02/2015   Reaction, adjustment, with depressed mood, prolonged 06/02/2015   Type 2 diabetes mellitus without complication, without long-term current use of insulin (HCC) 06/02/2015    Guss Bunde , PT, DPT 11/23/2019, 5:35 PM  [name] [address] [place], [place], [address] Phone: [phone]   Fax:  [phone]  Name: Micheal Bell MRN: [id number] Date of Birth: December 26, 1970

## 2019-11-24 DIAGNOSIS — M6281 Muscle weakness (generalized): Secondary | ICD-10-CM | POA: Diagnosis not present

## 2019-11-27 ENCOUNTER — Ambulatory Visit: Payer: Medicaid Other | Attending: Internal Medicine | Admitting: Physical Therapy

## 2019-11-27 ENCOUNTER — Other Ambulatory Visit: Payer: Self-pay

## 2019-11-27 DIAGNOSIS — R262 Difficulty in walking, not elsewhere classified: Secondary | ICD-10-CM | POA: Diagnosis not present

## 2019-11-27 DIAGNOSIS — M6281 Muscle weakness (generalized): Secondary | ICD-10-CM | POA: Diagnosis not present

## 2019-11-27 DIAGNOSIS — R2681 Unsteadiness on feet: Secondary | ICD-10-CM | POA: Diagnosis not present

## 2019-11-27 NOTE — Therapy (Signed)
[name] [place], [place], [address] Phone: [phone]   Fax:  [phone]  Physical Therapy Treatment  Patient Details  Name: Micheal Bell MRN: [id number] Date of Birth: 1970-11-05 Referring Provider (PT): Holli Humbles, MD   Encounter Date: 11/27/2019   PT End of Session - 11/27/19 0852    Visit Number 2    Number of Visits 18    Date for PT Re-Evaluation 01/25/20    PT Start Time 0815    PT Stop Time 0856    PT Time Calculation (min) 41 min    Activity Tolerance Patient tolerated treatment well    Behavior During Therapy [facility] for tasks assessed/performed           Past Medical History:  Diagnosis Date  . Arthritis   . Asthma   . CHF (congestive heart failure) ([place])   . Diabetes mellitus without complication ([place])   . Fibromyalgia   . Gout   . Hypertension   . IBS (irritable bowel syndrome)   . Vein disorder     Past Surgical History:  Procedure Laterality Date  . FOOT SURGERY    . TEE WITHOUT CARDIOVERSION N/A 10/24/2019   Procedure: TRANSESOPHAGEAL ECHOCARDIOGRAM (TEE);  Surgeon: Josue Hector, MD;  Location: [facility] ENDOSCOPY;  Service: Cardiovascular;  Laterality: N/A;    There were no vitals filed for this visit.   Subjective Assessment - 11/27/19 0816    Subjective COVID-19 screening performed upon arrival. No pain except soreness in shoulder    Patient is accompained by: Family member    Pertinent History HTN, DM, history of COVID-19, CHF, Asthma    Limitations House hold activities;Standing;Walking    How long can you walk comfortably? short periods around home    Patient Stated Goals improve mobility    Currently in Pain? No/denies                             [facility] Adult PT Treatment/Exercise - 11/27/19 0001      Exercises   Exercises Knee/Hip;Lumbar;Shoulder      Lumbar Exercises: Aerobic   Nustep L2-3 x6 min UE/LE      Lumbar Exercises: Seated   Other  Seated Lumbar Exercises seated press downs on red swiss 3sec hold x20    Other Seated Lumbar Exercises seated trunk rotation 2x5 with draw in      Knee/Hip Exercises: Standing   Heel Raises Both;1 set;10 reps    Hip Flexion Stengthening;Both;2 sets;10 reps;Knee bent    Other Standing Knee Exercises church pew x15      Knee/Hip Exercises: Seated   Long Arc Quad Strengthening;Both;2 sets;10 reps;Weights    Long Arc Quad Weight 2 lbs.    Other Seated Knee/Hip Exercises hip abd with red band x20    Marching Strengthening;Both   2x10   Hamstring Curl Strengthening;Both   x25 reps each   Hamstring Limitations red band      Shoulder Exercises: Seated   Retraction Strengthening;Both;20 reps      Shoulder Exercises: ROM/Strengthening   Ranger seated UE ranger for circles and flexion x49mn                    PT Short Term Goals - 11/27/19 0820      PT SHORT TERM GOAL #1   Title Patient will be independent with HEP    Baseline Met 11/27/19    Time 3  Period Weeks    Status Achieved      PT SHORT TERM GOAL #2   Title Patient will perform 5 sit to stands in 30 seconds or less with bilateral UE support to improve functional LE strength.    Baseline 3 sit to stands with UE support in 30 seconds    Time 3    Period Weeks    Status On-going             PT Long Term Goals - 11/27/19 0831      PT LONG TERM GOAL #1   Title Patient will be independent with advanced HEP    Baseline no knowledge of HEP    Time 8    Period Weeks    Status On-going      PT LONG TERM GOAL #2   Title Patient will demonstrate 3+/5 right hip flexion MMT to improve ability to perform functional tasks.    Baseline 3-/5 right hip MMT    Time 8    Period Weeks    Status On-going      PT LONG TERM GOAL #3   Title Patient will ambulate with rolling walker or LRAD with step through gait pattern and equal step lengths to improve gait mechanics.    Baseline Ambulating with rolling walker with  narrow base of support and varying step to and step through pattern, decreased right step length    Time 8    Status On-going      PT LONG TERM GOAL #4   Title Patient will report ability to perform bed mobility, transfers, and ADLs independently with proper sequencing and technique.    Baseline Mom and uncle assists with bed mobility and transfers at home;    Time 8    Period Weeks    Status On-going      PT LONG TERM GOAL #5   Title Patient will demonstrate 15+ lbs of right grip strength force to improve ability to perform fine motor tasks and functional tasks.    Baseline 10 lb R grip strength    Time 8    Period Weeks    Status On-going                 Plan - 11/27/19 0901    Clinical Impression Statement Patient tolerated treatment well today. Today focused on seated exercises for core, LE and minor UE followed by a few standing exercises. Patient vitals were low with HR down to 59 yet increased up to 80's and O2 88 then up to 90 and above quickly. Monitored throughout treatment today. Patient reported only arm soreness today. Patient reported doing well with HEP at home per PT. Met STG #1 with thers progressing.    Personal Factors and Comorbidities Comorbidity 3+    Comorbidities HTN, DM, history of COVID-19, CHF, Asthma,  L sided stroke    Examination-Activity Limitations Stand;Dressing;Hygiene/Grooming;Transfers;Locomotion Level;Bed Mobility;Stairs    Examination-Participation Restrictions Occupation    Stability/Clinical Decision Making Evolving/Moderate complexity    Rehab Potential Good    PT Frequency 2x / week    PT Duration 8 weeks    PT Treatment/Interventions ADLs/Self Care Home Management;Neuromuscular re-education;Manual techniques;Passive range of motion;Balance training;Therapeutic exercise;Therapeutic activities;Stair training;Functional mobility training;Gait training;Patient/family education    PT Next Visit Plan Monitor fatigue and vitals, nustep, gentle  LE and UE strengthening and ROM exercises, gait training, please be mindful of overhead activities due to 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 Cutaneous abscess of left foot   . Acute respiratory failure with hypoxemia ([place])   . Diabetic foot ([place])   . Acute systolic heart failure ([place])   . Embolic infarction ([place])   . Hyperglycemia   . Hypoxia   . Lower respiratory tract infection due to COVID-19 virus 10/10/2019  . Benign hypertension 07/23/2015  . Gastro-esophageal reflux disease with esophagitis 07/23/2015  . Gout 07/23/2015  . Obstructive sleep apnea 07/23/2015  . Snoring 07/23/2015  . Chronic idiopathic gout of multiple sites 06/02/2015  . Class 2 obesity in adult 06/02/2015  . Fibromyalgia 06/02/2015  . Irritable bowel syndrome with diarrhea 06/02/2015  . Mild persistent asthma 06/02/2015  . Reaction, adjustment, with depressed mood, prolonged 06/02/2015  . Type 2 diabetes mellitus without complication, without long-term current use of insulin ([place]) 06/02/2015    Takaya Hyslop P, PTA 11/27/2019, 9:29 AM  [facility] [place], [place], [address] Phone: [phone]   Fax:  [phone]  Name: Micheal Bell MRN: [id number] Date of Birth: 03-09-1970

## 2019-11-30 ENCOUNTER — Ambulatory Visit: Payer: Medicaid Other | Admitting: Physical Therapy

## 2019-11-30 ENCOUNTER — Other Ambulatory Visit: Payer: Self-pay

## 2019-11-30 DIAGNOSIS — R2681 Unsteadiness on feet: Secondary | ICD-10-CM | POA: Diagnosis not present

## 2019-11-30 DIAGNOSIS — M6281 Muscle weakness (generalized): Secondary | ICD-10-CM | POA: Diagnosis not present

## 2019-11-30 DIAGNOSIS — R262 Difficulty in walking, not elsewhere classified: Secondary | ICD-10-CM | POA: Diagnosis not present

## 2019-11-30 NOTE — Therapy (Signed)
[name] [place], [place], [address] Phone: [phone]   Fax:  [phone]  Physical Therapy Treatment  Patient Details  Name: Micheal Bell MRN: [id number] Date of Birth: [birth year] Referring Provider (PT): Holli Humbles, MD   Encounter Date: 11/30/2019   PT End of Session - 11/30/19 0808    Visit Number 3    Number of Visits 18    Date for PT Re-Evaluation 01/25/20    PT Start Time 0733    PT Stop Time 0813    PT Time Calculation (min) 40 min    Activity Tolerance Patient tolerated treatment well    Behavior During Therapy [facility] for tasks assessed/performed           Past Medical History:  Diagnosis Date  . Arthritis   . Asthma   . CHF (congestive heart failure) ([place])   . Diabetes mellitus without complication ([place])   . Fibromyalgia   . Gout   . Hypertension   . IBS (irritable bowel syndrome)   . Vein disorder     Past Surgical History:  Procedure Laterality Date  . FOOT SURGERY    . TEE WITHOUT CARDIOVERSION N/A 10/24/2019   Procedure: TRANSESOPHAGEAL ECHOCARDIOGRAM (TEE);  Surgeon: Josue Hector, MD;  Location: [facility] ENDOSCOPY;  Service: Cardiovascular;  Laterality: N/A;    There were no vitals filed for this visit.   Subjective Assessment - 11/30/19 0741    Subjective COVID-19 screening performed upon arrival. Fatigue today per reported    Patient is accompained by: Family member    Pertinent History HTN, DM, history of COVID-19, CHF, Asthma    Limitations House hold activities;Standing;Walking    How long can you walk comfortably? short periods around home    Patient Stated Goals improve mobility    Currently in Pain? No/denies                             [facility] Adult PT Treatment/Exercise - 11/30/19 0001      Bed Mobility   Bed Mobility Sit to Supine;Left Sidelying to Sit;Rolling Right;Rolling Left;Supine to Sit;Sit to Sidelying Left      Lumbar Exercises: Aerobic    Nustep L2 x6 1/2 min UE/LE some right thight discomfort and needed to stop   HR and O2 WNL after     Lumbar Exercises: Seated   Other Seated Lumbar Exercises seated press downs on red swiss 3sec hold x20    Other Seated Lumbar Exercises seated trunk rotation 2x5 with draw in      Lumbar Exercises: Supine   Straight Leg Raise 2 seconds   2x10 with draw ins   Other Supine Lumbar Exercises clam with red band x30      Knee/Hip Exercises: Seated   Long Arc Quad Strengthening;Both;2 sets;10 reps;Weights    Long Arc Quad Weight 2 lbs.    Hamstring Curl Strengthening;Both;20 reps;5 reps    Hamstring Limitations red band    Sit to [employer] 5 reps                    PT Short Term Goals - 11/27/19 0820      PT SHORT TERM GOAL #1   Title Patient will be independent with HEP    Baseline Met 11/27/19    Time 3    Period Weeks    Status Achieved      PT SHORT TERM GOAL #2  Title Patient will perform 5 sit to stands in 30 seconds or less with bilateral UE support to improve functional LE strength.    Baseline 3 sit to stands with UE support in 30 seconds    Time 3    Period Weeks    Status On-going             PT Long Term Goals - 11/30/19 2376      PT LONG TERM GOAL #1   Title Patient will be independent with advanced HEP    Baseline no knowledge of HEP    Time 8    Period Weeks    Status On-going      PT LONG TERM GOAL #2   Title Patient will demonstrate 3+/5 right hip flexion MMT to improve ability to perform functional tasks.    Baseline 3-/5 right hip MMT    Time 8    Period Weeks    Status On-going      PT LONG TERM GOAL #3   Title Patient will ambulate with rolling walker or LRAD with step through gait pattern and equal step lengths to improve gait mechanics.    Baseline Ambulating with rolling walker with narrow base of support and varying step to and step through pattern, decreased right step length    Time 8    Period Weeks    Status On-going      PT  LONG TERM GOAL #4   Title Patient will report ability to perform bed mobility, transfers, and ADLs independently with proper sequencing and technique.    Baseline Patient independent with bed mobility yet ADL's ongoing 11/30/19    Time 8    Period Weeks    Status On-going      PT LONG TERM GOAL #5   Title Patient will demonstrate 15+ lbs of right grip strength force to improve ability to perform fine motor tasks and functional tasks.    Baseline 10 lb R grip strength    Time 8    Period Weeks    Status On-going                 Plan - 11/30/19 0751    Clinical Impression Statement Patient tolerated treatment well today, some overall muscle soreness from last treatment. Patient reported he had not ben taking meds as he should of due to not having the correct list yet now he is on track with all meds. Patient O2 and HR WNL throughout treatment today although HR dropped some with supine to sit transition then normalized in a minute after rest. Patient able to progress with exercises today and required min assist with supine to sit transition. Patient has been able to get out of bed at home independent. Patient goals ongoing this week.    Personal Factors and Comorbidities Comorbidity 3+    Comorbidities HTN, DM, history of COVID-19, CHF, Asthma,  L sided stroke    Examination-Activity Limitations Stand;Dressing;Hygiene/Grooming;Transfers;Locomotion Level;Bed Mobility;Stairs    Examination-Participation Restrictions Occupation    Stability/Clinical Decision Making Evolving/Moderate complexity    Rehab Potential Good    PT Frequency 2x / week    PT Duration 8 weeks    PT Treatment/Interventions ADLs/Self Care Home Management;Neuromuscular re-education;Manual techniques;Passive range of motion;Balance training;Therapeutic exercise;Therapeutic activities;Stair training;Functional mobility training;Gait training;Patient/family education    PT Next Visit Plan cont with POC and Monitor fatigue  and vitals, nustep, gentle LE and UE strengthening and ROM exercises, gait training, please be mindful of overhead activities  due to 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 Cutaneous abscess of left foot   . Acute respiratory failure with hypoxemia ([place])   . Diabetic foot ([place])   . Acute systolic heart failure ([place])   . Embolic infarction ([place])   . Hyperglycemia   . Hypoxia   . Lower respiratory tract infection due to COVID-19 virus 10/10/2019  . Benign hypertension 07/23/2015  . Gastro-esophageal reflux disease with esophagitis 07/23/2015  . Gout 07/23/2015  . Obstructive sleep apnea 07/23/2015  . Snoring 07/23/2015  . Chronic idiopathic gout of multiple sites 06/02/2015  . Class 2 obesity in adult 06/02/2015  . Fibromyalgia 06/02/2015  . Irritable bowel syndrome with diarrhea 06/02/2015  . Mild persistent asthma 06/02/2015  . Reaction, adjustment, with depressed mood, prolonged 06/02/2015  . Type 2 diabetes mellitus without complication, without long-term current use of insulin ([place]) 06/02/2015    Ava Deguire P, PTA 11/30/2019, 8:23 AM  [facility] [place], [place], [address] Phone: [phone]   Fax:  [phone]  Name: LUM STILLINGER MRN: [id number] Date of Birth: 06/26/1970

## 2019-12-04 ENCOUNTER — Telehealth: Payer: Self-pay

## 2019-12-04 ENCOUNTER — Other Ambulatory Visit: Payer: Self-pay

## 2019-12-04 ENCOUNTER — Ambulatory Visit: Payer: Medicaid Other | Admitting: Physical Therapy

## 2019-12-04 DIAGNOSIS — R262 Difficulty in walking, not elsewhere classified: Secondary | ICD-10-CM

## 2019-12-04 DIAGNOSIS — M6281 Muscle weakness (generalized): Secondary | ICD-10-CM

## 2019-12-04 DIAGNOSIS — R2681 Unsteadiness on feet: Secondary | ICD-10-CM | POA: Diagnosis not present

## 2019-12-04 NOTE — Telephone Encounter (Signed)
NOTES ON FILE FROM [facility] [phone], SENT REFERRAL TO SCHEDULING °

## 2019-12-04 NOTE — Therapy (Signed)
[name] [place], [place], [address] Phone: [phone]   Fax:  [phone]  Physical Therapy Treatment  Patient Details  Name: Micheal Bell MRN: [id number] Date of Birth: 1970-11-14 Referring Provider (PT): Holli Humbles, MD   Encounter Date: 12/04/2019   PT End of Session - 12/04/19 0846    Visit Number 4    Number of Visits 18    Date for PT Re-Evaluation 01/25/20    PT Start Time 0815    PT Stop Time 0858    PT Time Calculation (min) 43 min    Activity Tolerance Patient tolerated treatment well    Behavior During Therapy [facility] for tasks assessed/performed           Past Medical History:  Diagnosis Date  . Arthritis   . Asthma   . CHF (congestive heart failure) ([place])   . Diabetes mellitus without complication ([place])   . Fibromyalgia   . Gout   . Hypertension   . IBS (irritable bowel syndrome)   . Vein disorder     Past Surgical History:  Procedure Laterality Date  . FOOT SURGERY    . TEE WITHOUT CARDIOVERSION N/A 10/24/2019   Procedure: TRANSESOPHAGEAL ECHOCARDIOGRAM (TEE);  Surgeon: Josue Hector, MD;  Location: [facility] ENDOSCOPY;  Service: Cardiovascular;  Laterality: N/A;    There were no vitals filed for this visit.   Subjective Assessment - 12/04/19 0822    Subjective COVID-19 screening performed upon arrival. Patient arrived with overall not feeling well    Patient is accompained by: Family member    Pertinent History HTN, DM, history of COVID-19, CHF, Asthma    Limitations House hold activities;Standing;Walking    How long can you walk comfortably? short periods around home    Patient Stated Goals improve mobility    Currently in Pain? Yes    Pain Score --   no nuber provided   Pain Location Generalized    Pain Descriptors / Indicators Discomfort    Pain Type Acute pain    Pain Radiating Towards back    Pain Onset More than a month ago    Pain Frequency Intermittent    Aggravating  Factors  unknown    Pain Relieving Factors rest                             [facility] Adult PT Treatment/Exercise - 12/04/19 0001      Lumbar Exercises: Aerobic   Nustep L2 x 6mn UE/LE (45 SPM)      Lumbar Exercises: Standing   Other Standing Lumbar Exercises standing press downs on red swiss for core activation x269m   standing with mat table behind rollator in front     Knee/Hip Exercises: Standing   Heel Raises Both;1 set;15 reps    Hip Extension Stengthening;AROM;Both;1 set;10 reps      Knee/Hip Exercises: Seated   Other Seated Knee/Hip Exercises hip abd with red band x30    Marching Strengthening;Both;2 sets;10 reps    Marching Limitations red band      Shoulder Exercises: ROM/Strengthening   Ranger seated UE ranger for circles and flexion x3m72m                 PT Education - 12/04/19 0845    Education Details HEP progresion w red band    Person(s) Educated Patient    Methods Explanation;Demonstration;Handout    Comprehension Verbalized understanding;Returned demonstration  PT Short Term Goals - 12/04/19 0850      PT SHORT TERM GOAL #1   Title Patient will be independent with HEP    Baseline Met 11/27/19    Period Weeks    Status Achieved      PT SHORT TERM GOAL #2   Title Patient will perform 5 sit to stands in 30 seconds or less with bilateral UE support to improve functional LE strength.    Baseline 4 sit to stands with UE support in 30 seconds 12/04/19    Time 3    Period Weeks    Status On-going             PT Long Term Goals - 12/04/19 0853      PT LONG TERM GOAL #1   Title Patient will be independent with advanced HEP    Time 8    Period Weeks    Status On-going      PT LONG TERM GOAL #2   Title Patient will demonstrate 3+/5 right hip flexion MMT to improve ability to perform functional tasks.    Baseline 3-/5 right hip MMT eval    Time 8    Period Weeks    Status On-going      PT LONG TERM GOAL #3     Title Patient will ambulate with rolling walker or LRAD with step through gait pattern and equal step lengths to improve gait mechanics.    Baseline Ambulating with rolling walker with narrow base of support and varying step to and step through pattern, decreased right step length    Time 8    Period Weeks    Status On-going      PT LONG TERM GOAL #4   Title Patient will report ability to perform bed mobility, transfers, and ADLs independently with proper sequencing and technique.    Baseline Patient independent with bed mobility yet ADL's ongoing 11/30/19    Time 8    Period Weeks    Status On-going      PT LONG TERM GOAL #5   Title Patient will demonstrate 15+ lbs of right grip strength force to improve ability to perform fine motor tasks and functional tasks.    Baseline 10 lb R grip strength    Time 8    Period Weeks    Status On-going                 Plan - 12/04/19 0855    Clinical Impression Statement Patient tolerated treatment well today even though arrived not feeling well. Patient reported feeling better with activity today. patient able to increase activity tolerance today on nustep. Patient issued HEP progression today. Educational cues for core activation troughout activity. Goals progressing. vitals WNL today.    Personal Factors and Comorbidities Comorbidity 3+    Comorbidities HTN, DM, history of COVID-19, CHF, Asthma,  L sided stroke    Examination-Activity Limitations Stand;Dressing;Hygiene/Grooming;Transfers;Locomotion Level;Bed Mobility;Stairs    Examination-Participation Restrictions Occupation    Stability/Clinical Decision Making Evolving/Moderate complexity    Rehab Potential Good    PT Frequency 2x / week    PT Duration 8 weeks    PT Treatment/Interventions ADLs/Self Care Home Management;Neuromuscular re-education;Manual techniques;Passive range of motion;Balance training;Therapeutic exercise;Therapeutic activities;Stair training;Functional mobility  training;Gait training;Patient/family education    PT Next Visit Plan cont with POC and Monitor fatigue and vitals, nustep, gentle LE and UE strengthening and ROM exercises, gait training, please be mindful of overhead activities due to history of  bilateral RTC tears (not been repaired)    Consulted and Agree with Plan of Care Patient           Patient will benefit from skilled therapeutic intervention in order to improve the following deficits and impairments:  Abnormal gait, Difficulty walking, Pain, Decreased balance, Decreased activity tolerance, Decreased strength, Postural dysfunction, Decreased endurance  Visit Diagnosis: Muscle weakness (generalized)  Difficulty in walking, not elsewhere classified  Unsteadiness on feet     Problem List Patient Active Problem List   Diagnosis Date Noted  . Cutaneous abscess of left foot   . Acute respiratory failure with hypoxemia ([place])   . Diabetic foot ([place])   . Acute systolic heart failure ([place])   . Embolic infarction ([place])   . Hyperglycemia   . Hypoxia   . Lower respiratory tract infection due to COVID-19 virus 10/10/2019  . Benign hypertension 07/23/2015  . Gastro-esophageal reflux disease with esophagitis 07/23/2015  . Gout 07/23/2015  . Obstructive sleep apnea 07/23/2015  . Snoring 07/23/2015  . Chronic idiopathic gout of multiple sites 06/02/2015  . Class 2 obesity in adult 06/02/2015  . Fibromyalgia 06/02/2015  . Irritable bowel syndrome with diarrhea 06/02/2015  . Mild persistent asthma 06/02/2015  . Reaction, adjustment, with depressed mood, prolonged 06/02/2015  . Type 2 diabetes mellitus without complication, without long-term current use of insulin ([place]) 06/02/2015    Zaydan Papesh P, PTA 12/04/2019, 9:04 AM  [facility] [place], [place], [address] Phone: [phone]   Fax:  [phone]  Name: KHAN CHURA MRN: [id number] Date of Birth:  1970-12-30

## 2019-12-04 NOTE — Patient Instructions (Signed)
  Lower Body: Toe Rise   Standing, place feet apart. Hold arms out for balance or use support. Rise up on toes. Hold _3___ seconds, then lower. Repeat immediately. Repeat __10__ times. Do __2__ sessions per day.   Half Squat to Chair   Stand with feet shoulder width apart. Push buttocks backward and lower slowly, sitting in chair lightly and returning to standing position. Complete _2_ sets of 10_ repetitions. Perform __2-3_ sessions per day.     Hip abduction   While sitting with good posture, tie theraband around knees and pull apart. Slowly resume starting position. x30 1-2 x day  Knee Extension: Resisted (Sitting)    With band looped around right ankle and under other foot, straighten leg with ankle loop. Keep other leg bent to increase resistance. Repeat __20__ times per set. Do __1-2__ sets per session. Do __1-2__ sessions per day.   Knee Flexion: Resisted (Sitting)    Sit with band under left foot and looped around ankle of supported leg. Pull unsupported leg back. Repeat _20___ times per set. Do __1-2__ sets per session. Do __1-2__ sessions per day.    SEATED MARCHING - ELASTIC BAND  Start by sitting in a chair with an elastic band wrapped around your lower thighs.  Next, move a knee upward, set it back down and then alternate to the other side. Perform 10-20 1-2x daily    

## 2019-12-06 ENCOUNTER — Encounter: Payer: Self-pay | Admitting: Internal Medicine

## 2019-12-06 ENCOUNTER — Ambulatory Visit (INDEPENDENT_AMBULATORY_CARE_PROVIDER_SITE_OTHER): Payer: Medicaid Other | Admitting: Internal Medicine

## 2019-12-06 ENCOUNTER — Other Ambulatory Visit: Payer: Self-pay

## 2019-12-06 VITALS — BP 128/86 | HR 105 | Temp 97.7°F | Ht 72.0 in | Wt 243.0 lb

## 2019-12-06 DIAGNOSIS — U071 COVID-19: Secondary | ICD-10-CM | POA: Diagnosis not present

## 2019-12-06 DIAGNOSIS — J22 Unspecified acute lower respiratory infection: Secondary | ICD-10-CM

## 2019-12-06 DIAGNOSIS — L02612 Cutaneous abscess of left foot: Secondary | ICD-10-CM

## 2019-12-06 DIAGNOSIS — G0490 Encephalitis and encephalomyelitis, unspecified: Secondary | ICD-10-CM

## 2019-12-06 NOTE — Progress Notes (Signed)
   Subjective:    Patient ID: Micheal Bell, male    DOB: [birth year], 49 y.o.   MRN: [id number]  HPI Here for hsfu He was hospitalized inAugust with COVID pneumonia requiring ventilator support and also developed MSSA and GBS bacteremia.  He had a foot wound with draining of pus and MSSA did grow in the culture from his foot as well.  He had waxing and waning AMS and MRI noted cerebritis so was treated with prolonged nafcillin.  He went to rehab and now at home.  He feels better overall though with a persistent cough with some sputum.  No fever.  Does endorse DOE but is slowly improving.  He is followed by his PCP and podiatry as well.    Review of Systems  Constitutional: Positive for fatigue. Negative for chills and fever.  Respiratory: Positive for cough and shortness of breath.   Gastrointestinal: Negative for diarrhea and nausea.  Skin: Negative for rash.       Objective:   Physical Exam Constitutional:      Appearance: Normal appearance.  Eyes:     General: No scleral icterus. Cardiovascular:     Rate and Rhythm: Normal rate and regular rhythm.  Pulmonary:     Effort: Pulmonary effort is normal.     Breath sounds: Normal breath sounds.  Musculoskeletal:     Comments: Left foot at the site of the previous infection has some warmth, no tenderness.    Neurological:     Mental Status: He is alert.  Psychiatric:        Mood and Affect: Mood normal.   SH: no tobacco        Assessment & Plan:

## 2019-12-06 NOTE — Assessment & Plan Note (Signed)
Likely from his bacteremia and now has resolved after completion of nafcillin.

## 2019-12-06 NOTE — Assessment & Plan Note (Signed)
He has some warmth and erythema at the site now but otherwise is closed with no tenderness.  ? If it is reactive.  He had not noticed that previously.  His brother and him will monitor and if it worsens, he will go back to his podiatrist for evaluation.  Could be gout but does not bother him.

## 2019-12-06 NOTE — Assessment & Plan Note (Signed)
He has residual COVID symptoms and hopefully will continue to improve with increasing activity.  Continue with supportive care

## 2019-12-07 ENCOUNTER — Encounter: Payer: Self-pay | Admitting: Physical Therapy

## 2019-12-07 ENCOUNTER — Ambulatory Visit: Payer: Medicaid Other | Admitting: Physical Therapy

## 2019-12-07 ENCOUNTER — Other Ambulatory Visit: Payer: Self-pay

## 2019-12-07 DIAGNOSIS — M6281 Muscle weakness (generalized): Secondary | ICD-10-CM

## 2019-12-07 DIAGNOSIS — R2681 Unsteadiness on feet: Secondary | ICD-10-CM | POA: Diagnosis not present

## 2019-12-07 DIAGNOSIS — R262 Difficulty in walking, not elsewhere classified: Secondary | ICD-10-CM | POA: Diagnosis not present

## 2019-12-07 NOTE — Therapy (Signed)
[name] [place], [place], [address] Phone: [phone]   Fax:  [phone]  Physical Therapy Treatment  Patient Details  Name: Micheal Bell MRN: [id number] Date of Birth: 1970/04/01 Referring Provider (PT): Holli Humbles, MD   Encounter Date: 12/07/2019   PT End of Session - 12/07/19 0927    Visit Number 5    Number of Visits 18    Date for PT Re-Evaluation 01/25/20    PT Start Time 0815    PT Stop Time 0900    PT Time Calculation (min) 45 min    Equipment Utilized During Treatment Other (comment)   Rollator   Activity Tolerance Patient tolerated treatment well    Behavior During Therapy [facility] for tasks assessed/performed           Past Medical History:  Diagnosis Date  . Arthritis   . Asthma   . CHF (congestive heart failure) ([place])   . Diabetes mellitus without complication ([place])   . Fibromyalgia   . Gout   . Hypertension   . IBS (irritable bowel syndrome)   . Vein disorder     Past Surgical History:  Procedure Laterality Date  . FOOT SURGERY    . TEE WITHOUT CARDIOVERSION N/A 10/24/2019   Procedure: TRANSESOPHAGEAL ECHOCARDIOGRAM (TEE);  Surgeon: Josue Hector, MD;  Location: [facility] ENDOSCOPY;  Service: Cardiovascular;  Laterality: N/A;    There were no vitals filed for this visit.   Subjective Assessment - 12/07/19 0926    Subjective COVID-19 screening performed upon arrival. Patient reports feeling tired.    Patient is accompained by: Family member    Pertinent History HTN, DM, history of COVID-19, CHF, Asthma    Limitations House hold activities;Standing;Walking    How long can you walk comfortably? short periods around home    Patient Stated Goals improve mobility    Currently in Pain? Yes   did not provide number on pain scale             [facility] PT Assessment - 12/07/19 0001      Assessment   Medical Diagnosis ambulatory disfunction, weakness1    Referring Provider (PT) Holli Humbles, MD    Onset Date/Surgical Date 10/10/19    Hand Dominance Right    Next MD Visit n/a    Prior Therapy In hospital      Precautions   Precautions Fall                         [facility] Adult PT Treatment/Exercise - 12/07/19 0001      Lumbar Exercises: Stretches   Lower Trunk Rotation 2 reps;30 seconds      Lumbar Exercises: Aerobic   Nustep L2 x 17mn UE/LE (420SPM)      Lumbar Exercises: Seated   Other Seated Lumbar Exercises seated press downs on red swiss 3sec hold x30    Other Seated Lumbar Exercises seated trunk rotations with 2# ball x20      Knee/Hip Exercises: Seated   Long Arc Quad Strengthening;Both;2 sets;10 reps;Weights    Long Arc Quad Weight 2 lbs.    Other Seated Knee/Hip Exercises hip abd with red band x30    Marching Strengthening;Both;3 sets;10 reps    Marching Limitations red band    Hamstring Curl Strengthening;Both;20 reps;5 reps    Hamstring Limitations red band      Knee/Hip Exercises: Supine   Short Arc [employer] administratorOther (comment)  x1 min each   Bridges AROM;Both;20 reps                    PT Short Term Goals - 12/04/19 0850      PT SHORT TERM GOAL #1   Title Patient will be independent with HEP    Baseline Met 11/27/19    Period Weeks    Status Achieved      PT SHORT TERM GOAL #2   Title Patient will perform 5 sit to stands in 30 seconds or less with bilateral UE support to improve functional LE strength.    Baseline 4 sit to stands with UE support in 30 seconds 12/04/19    Time 3    Period Weeks    Status On-going             PT Long Term Goals - 12/04/19 0853      PT LONG TERM GOAL #1   Title Patient will be independent with advanced HEP    Time 8    Period Weeks    Status On-going      PT LONG TERM GOAL #2   Title Patient will demonstrate 3+/5 right hip flexion MMT to improve ability to perform functional tasks.    Baseline 3-/5 right hip MMT eval    Time 8    Period Weeks     Status On-going      PT LONG TERM GOAL #3   Title Patient will ambulate with rolling walker or LRAD with step through gait pattern and equal step lengths to improve gait mechanics.    Baseline Ambulating with rolling walker with narrow base of support and varying step to and step through pattern, decreased right step length    Time 8    Period Weeks    Status On-going      PT LONG TERM GOAL #4   Title Patient will report ability to perform bed mobility, transfers, and ADLs independently with proper sequencing and technique.    Baseline Patient independent with bed mobility yet ADL's ongoing 11/30/19    Time 8    Period Weeks    Status On-going      PT LONG TERM GOAL #5   Title Patient will demonstrate 15+ lbs of right grip strength force to improve ability to perform fine motor tasks and functional tasks.    Baseline 10 lb R grip strength    Time 8    Period Weeks    Status On-going                 Plan - 12/07/19 0953    Clinical Impression Statement Patient responded fairly well but required increased rest secondary to fatigue. Patient guided through TEs and provided with verbal cuing for proper form and technique. Patient required seated rest breaks with pursed lip breathing to increase SaO2 levels above 95% on room air.    Personal Factors and Comorbidities Comorbidity 3+    Comorbidities HTN, DM, history of COVID-19, CHF, Asthma,  L sided stroke    Examination-Activity Limitations Stand;Dressing;Hygiene/Grooming;Transfers;Locomotion Level;Bed Mobility;Stairs    Examination-Participation Restrictions Occupation    Stability/Clinical Decision Making Evolving/Moderate complexity    Clinical Decision Making Moderate    Rehab Potential Good    PT Frequency 2x / week    PT Duration 8 weeks    PT Treatment/Interventions ADLs/Self Care Home Management;Neuromuscular re-education;Manual techniques;Passive range of motion;Balance training;Therapeutic exercise;Therapeutic  activities;Stair training;Functional mobility training;Gait training;Patient/family education    PT  Next Visit Plan cont with POC and Monitor fatigue and vitals, nustep, gentle LE and UE strengthening and ROM exercises, gait training, please be mindful of overhead activities due to 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)    PT Home Exercise Plan see patient education    Consulted and Agree with Plan of Care Patient    Family Member Consulted Uncle, Jeneen Rinks           Patient will benefit from skilled therapeutic intervention in order to improve the following deficits and impairments:  Abnormal gait, Difficulty walking, Pain, Decreased balance, Decreased activity tolerance, Decreased strength, Postural dysfunction, Decreased endurance  Visit Diagnosis: Muscle weakness (generalized)  Difficulty in walking, not elsewhere classified  Unsteadiness on feet     Problem List Patient Active Problem List   Diagnosis Date Noted  . Cerebritis 12/06/2019  . Cutaneous abscess of left foot   . Diabetic foot ([place])   . Acute systolic heart failure ([place])   . Embolic infarction ([place])   . Hyperglycemia   . Hypoxia   . Lower respiratory tract infection due to COVID-19 virus 10/10/2019  . Benign hypertension 07/23/2015  . Gastro-esophageal reflux disease with esophagitis 07/23/2015  . Gout 07/23/2015  . Obstructive sleep apnea 07/23/2015  . Snoring 07/23/2015  . Chronic idiopathic gout of multiple sites 06/02/2015  . Class 2 obesity in adult 06/02/2015  . Fibromyalgia 06/02/2015  . Irritable bowel syndrome with diarrhea 06/02/2015  . Mild persistent asthma 06/02/2015  . Reaction, adjustment, with depressed mood, prolonged 06/02/2015  . Type 2 diabetes mellitus without complication, without long-term current use of insulin ([place]) 06/02/2015    Gabriela Eves, PT, DPT 12/07/2019, 10:16 AM  [facility] [place], [place],  [address] Phone: [phone]   Fax:  [phone]  Name: Micheal Bell MRN: [id number] Date of Birth: [birth year]

## 2019-12-11 ENCOUNTER — Ambulatory Visit: Payer: Medicaid Other | Admitting: Physical Therapy

## 2019-12-11 ENCOUNTER — Other Ambulatory Visit: Payer: Self-pay

## 2019-12-11 DIAGNOSIS — M6281 Muscle weakness (generalized): Secondary | ICD-10-CM | POA: Diagnosis not present

## 2019-12-11 DIAGNOSIS — R262 Difficulty in walking, not elsewhere classified: Secondary | ICD-10-CM

## 2019-12-11 DIAGNOSIS — R2681 Unsteadiness on feet: Secondary | ICD-10-CM | POA: Diagnosis not present

## 2019-12-11 NOTE — Therapy (Signed)
[name] [place], [place], [address] Phone: [phone]   Fax:  [phone]  Physical Therapy Treatment  Patient Details  Name: Micheal Bell MRN: [id number] Date of Birth: [birth year] Referring Provider (PT): Holli Humbles, MD   Encounter Date: 12/11/2019   PT End of Session - 12/11/19 0832    Visit Number 6    Number of Visits 18    Date for PT Re-Evaluation 01/25/20    PT Start Time 0815    PT Stop Time 0900    PT Time Calculation (min) 45 min    Activity Tolerance Patient tolerated treatment well    Behavior During Therapy [facility] for tasks assessed/performed           Past Medical History:  Diagnosis Date  . Arthritis   . Asthma   . CHF (congestive heart failure) ([place])   . Diabetes mellitus without complication ([place])   . Fibromyalgia   . Gout   . Hypertension   . IBS (irritable bowel syndrome)   . Vein disorder     Past Surgical History:  Procedure Laterality Date  . FOOT SURGERY    . TEE WITHOUT CARDIOVERSION N/A 10/24/2019   Procedure: TRANSESOPHAGEAL ECHOCARDIOGRAM (TEE);  Surgeon: Josue Hector, MD;  Location: [facility] ENDOSCOPY;  Service: Cardiovascular;  Laterality: N/A;    There were no vitals filed for this visit.   Subjective Assessment - 12/11/19 0821    Subjective COVID-19 screening performed upon arrival. Patient arrived feeling weak due to not sleeping well.    Patient is accompained by: Family member    Pertinent History HTN, DM, history of COVID-19, CHF, Asthma    Limitations House hold activities;Standing;Walking    How long can you walk comfortably? short periods around home    Patient Stated Goals improve mobility    Currently in Pain? Yes    Pain Score --   no number provided   Pain Location Back   right arm   Pain Orientation Lower    Pain Descriptors / Indicators Discomfort    Pain Type Chronic pain    Pain Onset More than a month ago    Pain Frequency Intermittent     Aggravating Factors  unsure    Pain Relieving Factors at rest              [facility] PT Assessment - 12/11/19 0001      Strength   Right/Left hand Right    Right Hand Grip (lbs) 12                         [facility] Adult PT Treatment/Exercise - 12/11/19 0001      Exercises   Exercises Knee/Hip;Lumbar;Hand      Lumbar Exercises: Aerobic   Nustep L3 x31mn UE/LE activity      Lumbar Exercises: Standing   Other Standing Lumbar Exercises standing press downs on red swiss for core activation x369m   rollator in front/mat table behind   Other Standing Lumbar Exercises standing toe taps on balance pods foward and side 2x10 each LE      Lumbar Exercises: Seated   Other Seated Lumbar Exercises seated trunk rotations with 2# ball x20      Knee/Hip Exercises: Seated   Long Arc Quad Strengthening;Both;10 reps;Weights;3 sets    Long Arc Quad Weight 2 lbs.    Other Seated Knee/Hip Exercises hip abd with red band x30    Marching Strengthening;Both;3  sets;10 reps    Marching Limitations red band    Hamstring Curl Strengthening;Both;1 set;20 reps;10 reps    Hamstring Limitations red band      Hand Exercises   Other Hand Exercises 1.4digiflex 3sec hold x21mn    Other Hand Exercises red flex bar for wrist ext/flex x354m                    PT Short Term Goals - 12/11/19 081829    PT SHORT TERM GOAL #1   Title Patient will be independent with HEP    Baseline Met 11/27/19    Time 3    Period Weeks    Status Achieved      PT SHORT TERM GOAL #2   Title Patient will perform 5 sit to stands in 30 seconds or less with bilateral UE support to improve functional LE strength.    Baseline 4 sit to stands with UE support in 30 seconds 12/04/19    Time 3    Period Weeks    Status On-going             PT Long Term Goals - 12/11/19 [id number]    PT LONG TERM GOAL #1   Title Patient will be independent with advanced HEP    Baseline no knowledge of HEP    Time 8     Period Weeks    Status On-going      PT LONG TERM GOAL #2   Title Patient will demonstrate 3+/5 right hip flexion MMT to improve ability to perform functional tasks.    Baseline 3-/5 right hip MMT eval    Time 8    Period Weeks    Status On-going      PT LONG TERM GOAL #3   Title Patient will ambulate with rolling walker or LRAD with step through gait pattern and equal step lengths to improve gait mechanics.    Baseline Ambulating with rolling walker with narrow base of support and varying step to and step through pattern, decreased right step length    Time 8    Period Weeks    Status On-going      PT LONG TERM GOAL #4   Title Patient will report ability to perform bed mobility, transfers, and ADLs independently with proper sequencing and technique.    Baseline Patient required assist this weekend due to back pain 12/11/19    Time 8    Period Weeks    Status On-going      PT LONG TERM GOAL #5   Title Patient will demonstrate 15+ lbs of right grip strength force to improve ability to perform fine motor tasks and functional tasks.    Baseline 12 lb R grip strength 12/11/19    Time 8    Period Weeks    Status On-going                 Plan - 12/11/19 0906    Clinical Impression Statement Patient tolerated treatment well today. Today focused on sitting to standing transition and tolerance with ambulating across therapy room mat to parallel bars.  Patient was able to perform standing exercises with increased tolerance and was able to ambulate 50+ feet with vital WNL. Patient improved grip strength today. patient current goals progressing due to limitations.    Personal Factors and Comorbidities Comorbidity 3+    Comorbidities HTN, DM, history of COVID-19, CHF, Asthma,  L sided stroke  Examination-Activity Limitations Stand;Dressing;Hygiene/Grooming;Transfers;Locomotion Level;Bed Mobility;Stairs    Examination-Participation Restrictions Occupation    Stability/Clinical  Decision Making Evolving/Moderate complexity    Rehab Potential Good    PT Frequency 2x / week    PT Duration 8 weeks    PT Treatment/Interventions ADLs/Self Care Home Management;Neuromuscular re-education;Manual techniques;Passive range of motion;Balance training;Therapeutic exercise;Therapeutic activities;Stair training;Functional mobility training;Gait training;Patient/family education    PT Next Visit Plan cont with POC and Monitor fatigue and vitals, nustep, gentle LE and UE strengthening and ROM exercises, gait training, please be mindful of overhead activities due to 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 Cerebritis 12/06/2019  . Cutaneous abscess of left foot   . Diabetic foot ([place])   . Acute systolic heart failure ([place])   . Embolic infarction ([place])   . Hyperglycemia   . Hypoxia   . Lower respiratory tract infection due to COVID-19 virus 10/10/2019  . Benign hypertension 07/23/2015  . Gastro-esophageal reflux disease with esophagitis 07/23/2015  . Gout 07/23/2015  . Obstructive sleep apnea 07/23/2015  . Snoring 07/23/2015  . Chronic idiopathic gout of multiple sites 06/02/2015  . Class 2 obesity in adult 06/02/2015  . Fibromyalgia 06/02/2015  . Irritable bowel syndrome with diarrhea 06/02/2015  . Mild persistent asthma 06/02/2015  . Reaction, adjustment, with depressed mood, prolonged 06/02/2015  . Type 2 diabetes mellitus without  complication, without long-term current use of insulin ([place]) 06/02/2015    Paulette Rockford P, PTA 12/11/2019, 9:25 AM  [facility] [place], [place], [address] Phone: [phone]   Fax:  [phone]  Name: YOUSUF AGER MRN: [id number] Date of Birth: 05-18-1970

## 2019-12-14 ENCOUNTER — Ambulatory Visit: Payer: Medicaid Other | Admitting: Physical Therapy

## 2019-12-14 ENCOUNTER — Other Ambulatory Visit: Payer: Self-pay

## 2019-12-14 DIAGNOSIS — R2681 Unsteadiness on feet: Secondary | ICD-10-CM | POA: Diagnosis not present

## 2019-12-14 DIAGNOSIS — M6281 Muscle weakness (generalized): Secondary | ICD-10-CM | POA: Diagnosis not present

## 2019-12-14 DIAGNOSIS — R262 Difficulty in walking, not elsewhere classified: Secondary | ICD-10-CM

## 2019-12-14 NOTE — Therapy (Signed)
[name] [place], [place], [address] Phone: [phone]   Fax:  [phone]  Physical Therapy Treatment  Patient Details  Name: Micheal Bell MRN: [id number] Date of Birth: [birth year] Referring Provider (PT): Holli Humbles, MD   Encounter Date: 12/14/2019   PT End of Session - 12/14/19 0831    Visit Number 7    Number of Visits 18    Date for PT Re-Evaluation 01/25/20    PT Start Time 0815    PT Stop Time 0859    PT Time Calculation (min) 44 min    Activity Tolerance Patient tolerated treatment well    Behavior During Therapy [facility] for tasks assessed/performed           Past Medical History:  Diagnosis Date  . Arthritis   . Asthma   . CHF (congestive heart failure) ([place])   . Diabetes mellitus without complication ([place])   . Fibromyalgia   . Gout   . Hypertension   . IBS (irritable bowel syndrome)   . Vein disorder     Past Surgical History:  Procedure Laterality Date  . FOOT SURGERY    . TEE WITHOUT CARDIOVERSION N/A 10/24/2019   Procedure: TRANSESOPHAGEAL ECHOCARDIOGRAM (TEE);  Surgeon: Josue Hector, MD;  Location: [facility] ENDOSCOPY;  Service: Cardiovascular;  Laterality: N/A;    There were no vitals filed for this visit.   Subjective Assessment - 12/14/19 0820    Subjective COVID-19 screening performed upon arrival. Patient reported doing well after last treatment and reported HR seemed low this AM.    Pertinent History HTN, DM, history of COVID-19, CHF, Asthma    Limitations House hold activities;Standing;Walking    How long can you walk comfortably? short periods around home    Patient Stated Goals improve mobility    Currently in Pain? Yes    Pain Score --   no number given   Pain Location Back    Pain Orientation Lower    Pain Descriptors / Indicators Discomfort    Pain Type Chronic pain    Pain Onset More than a month ago    Pain Frequency Intermittent    Aggravating Factors  prolong  standing    Pain Relieving Factors rest                             [facility] Adult PT Treatment/Exercise - 12/14/19 0001      Lumbar Exercises: Aerobic   Nustep L3 x44mn UE/LE activity   O2 96% / HR 129 post nustep     Lumbar Exercises: Standing   Other Standing Lumbar Exercises standing press downs on red swiss for core activation x320m      Lumbar Exercises: Seated   Other Seated Lumbar Exercises seated core sets with reaching out of BOS with grey ball   seated with small movement due due shoulder pains   Other Seated Lumbar Exercises seated trunk rotations with 2# ball x20      Knee/Hip Exercises: Standing   Heel Raises Both;2 sets;10 reps    Hip Flexion Stengthening;Both;10 reps;Knee bent;1 set    Hip Abduction Stengthening;Both;1 set;10 reps;Knee straight    Hip Extension Stengthening;Both;1 set;10 reps;Knee straight;Knee bent   difficulty keeping knee straight   Other Standing Knee Exercises Post standing exercises O2 99 HR 129      Knee/Hip Exercises: Seated   Long Arc Quad Strengthening;Both;2 sets;10 reps;Weights    Long [employer]  Weight 3 lbs.    Other Seated Knee/Hip Exercises hip abd with green band x30    Marching Strengthening;Both;2 sets;10 reps;Weights    Marching Weights 3 lbs.    Hamstring Curl Strengthening;Both;20 reps    Hamstring Limitations red band    Sit to [place] with UE support   8 reps                   PT Short Term Goals - 12/11/19 1324      PT SHORT TERM GOAL #1   Title Patient will be independent with HEP    Baseline Met 11/27/19    Time 3    Period Weeks    Status Achieved      PT SHORT TERM GOAL #2   Title Patient will perform 5 sit to stands in 30 seconds or less with bilateral UE support to improve functional LE strength.    Baseline 4 sit to stands with UE support in 30 seconds 12/04/19    Time 3    Period Weeks    Status On-going             PT Long Term Goals - 12/11/19 4010      PT LONG TERM GOAL  #1   Title Patient will be independent with advanced HEP    Baseline no knowledge of HEP    Time 8    Period Weeks    Status On-going      PT LONG TERM GOAL #2   Title Patient will demonstrate 3+/5 right hip flexion MMT to improve ability to perform functional tasks.    Baseline 3-/5 right hip MMT eval    Time 8    Period Weeks    Status On-going      PT LONG TERM GOAL #3   Title Patient will ambulate with rolling walker or LRAD with step through gait pattern and equal step lengths to improve gait mechanics.    Baseline Ambulating with rolling walker with narrow base of support and varying step to and step through pattern, decreased right step length    Time 8    Period Weeks    Status On-going      PT LONG TERM GOAL #4   Title Patient will report ability to perform bed mobility, transfers, and ADLs independently with proper sequencing and technique.    Baseline Patient required assist this weekend due to back pain 12/11/19    Time 8    Period Weeks    Status On-going      PT LONG TERM GOAL #5   Title Patient will demonstrate 15+ lbs of right grip strength force to improve ability to perform fine motor tasks and functional tasks.    Baseline 12 lb R grip strength 12/11/19    Time 8    Period Weeks    Status On-going                 Plan - 12/14/19 0840    Clinical Impression Statement Patient tolerated treatment well today. Patient HR and O2 WNL throughtout today. Patient able to increase activity tolerance with standing today and increased weight with seated exercises today. Patient continues to increase stance time at home to improve tolerance. Goals progressing this week.    Personal Factors and Comorbidities Comorbidity 3+    Comorbidities HTN, DM, history of COVID-19, CHF, Asthma,  L sided stroke    Examination-Activity Limitations Stand;Dressing;Hygiene/Grooming;Transfers;Locomotion Level;Bed Mobility;Stairs    Examination-Participation Restrictions Occupation  Stability/Clinical Decision Making Evolving/Moderate complexity    Rehab Potential Good    PT Frequency 2x / week    PT Duration 8 weeks    PT Treatment/Interventions ADLs/Self Care Home Management;Neuromuscular re-education;Manual techniques;Passive range of motion;Balance training;Therapeutic exercise;Therapeutic activities;Stair training;Functional mobility training;Gait training;Patient/family education    PT Next Visit Plan cont with POC and Monitor fatigue and vitals, nustep, gentle LE and UE strengthening and ROM exercises, gait training, please be mindful of overhead activities due to 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 Cerebritis 12/06/2019  . Cutaneous abscess of left foot   . Diabetic foot ([place])   . Acute systolic heart failure ([place])   . Embolic infarction ([place])   . Hyperglycemia   . Hypoxia   . Lower respiratory tract infection due to COVID-19 virus 10/10/2019  . Benign hypertension 07/23/2015  . Gastro-esophageal reflux disease with esophagitis 07/23/2015  . Gout 07/23/2015  . Obstructive sleep apnea 07/23/2015  . Snoring 07/23/2015  . Chronic idiopathic gout of multiple sites 06/02/2015  . Class 2 obesity in adult 06/02/2015  . Fibromyalgia 06/02/2015  . Irritable bowel syndrome with diarrhea 06/02/2015  . Mild persistent asthma 06/02/2015  . Reaction, adjustment, with depressed mood, prolonged 06/02/2015  . Type 2 diabetes  mellitus without complication, without long-term current use of insulin ([place]) 06/02/2015    Leondra Cullin P, PTA 12/14/2019, 9:00 AM  [facility] [place], [place], [address] Phone: [phone]   Fax:  [phone]  Name: Micheal Bell MRN: [id number] Date of Birth: [birth year]

## 2019-12-18 ENCOUNTER — Other Ambulatory Visit: Payer: Self-pay

## 2019-12-18 ENCOUNTER — Ambulatory Visit: Payer: Medicaid Other | Admitting: Physical Therapy

## 2019-12-18 DIAGNOSIS — R262 Difficulty in walking, not elsewhere classified: Secondary | ICD-10-CM | POA: Diagnosis not present

## 2019-12-18 DIAGNOSIS — R2681 Unsteadiness on feet: Secondary | ICD-10-CM | POA: Diagnosis not present

## 2019-12-18 DIAGNOSIS — M6281 Muscle weakness (generalized): Secondary | ICD-10-CM

## 2019-12-18 NOTE — Therapy (Signed)
[name] [place], [place], [address] Phone: [phone]   Fax:  [phone]  Physical Therapy Treatment  Patient Details  Name: Micheal Bell MRN: [id number] Date of Birth: 23-May-1970 Referring Provider (PT): Holli Humbles, MD   Encounter Date: 12/18/2019   PT End of Session - 12/18/19 0934    Visit Number 8    Number of Visits 18    Date for PT Re-Evaluation 01/25/20    PT Start Time 0900    PT Stop Time 0944    PT Time Calculation (min) 44 min    Equipment Utilized During Treatment Other (comment)   rollator   Activity Tolerance Patient tolerated treatment well    Behavior During Therapy [facility] for tasks assessed/performed           Past Medical History:  Diagnosis Date  . Arthritis   . Asthma   . CHF (congestive heart failure) ([place])   . Diabetes mellitus without complication ([place])   . Fibromyalgia   . Gout   . Hypertension   . IBS (irritable bowel syndrome)   . Vein disorder     Past Surgical History:  Procedure Laterality Date  . FOOT SURGERY    . TEE WITHOUT CARDIOVERSION N/A 10/24/2019   Procedure: TRANSESOPHAGEAL ECHOCARDIOGRAM (TEE);  Surgeon: Josue Hector, MD;  Location: [facility] ENDOSCOPY;  Service: Cardiovascular;  Laterality: N/A;    There were no vitals filed for this visit.   Subjective Assessment - 12/18/19 0931    Subjective COVID-19 screening performed upon arrival. Patient reports doing well. Got lift chair assembled and is able to sit<>stand without having to lift the chair intermittently.    Patient is accompained by: Family member    Pertinent History HTN, DM, history of COVID-19, CHF, Asthma    Limitations House hold activities;Standing;Walking    How long can you walk comfortably? short periods around home    Patient Stated Goals improve mobility    Currently in Pain? Yes    Pain Score 8     Pain Location Back    Pain Orientation Lower    Pain Descriptors / Indicators  Discomfort    Pain Type Chronic pain              [facility] PT Assessment - 12/18/19 0001      Assessment   Medical Diagnosis ambulatory disfunction, weakness1    Referring Provider (PT) Holli Humbles, MD    Onset Date/Surgical Date 10/10/19    Hand Dominance Right    Next MD Visit n/a    Prior Therapy In hospital      Precautions   Precautions Fall                         [place] Adult PT Treatment/Exercise - 12/18/19 0001      Lumbar Exercises: Aerobic   Nustep L3 x75mn UE/LE activity   post: 98% SaO2; 87 BPM     Lumbar Exercises: Seated   Other Seated Lumbar Exercises seated trunk rotations with 4# ball x20      Knee/Hip Exercises: Standing   Heel Raises Both;2 sets;10 reps    Hip Flexion Stengthening;Both;2 sets;10 reps;Knee bent    Hip Flexion Limitations 6" box taps    Hip Abduction Stengthening;Both;10 reps;Knee straight;2 sets    Other Standing Knee Exercises Post standing exercises O2 93 HR 113; recovery 2 mins to 96 SaO2,  90 BPM      Knee/Hip  Exercises: Seated   Long Arc Quad Strengthening;Both;2 sets;10 reps;Weights    Long Arc Quad Weight 3 lbs.    Other Seated Knee/Hip Exercises hip abd with green band x30    Other Seated Knee/Hip Exercises 97% SaO2; 125 BPM post seated exercises    Marching Strengthening;Both;2 sets;10 reps;Weights    Marching Weights 3 lbs.    Sit to [place] with UE support                    PT Short Term Goals - 12/11/19 8315      PT SHORT TERM GOAL #1   Title Patient will be independent with HEP    Baseline Met 11/27/19    Time 3    Period Weeks    Status Achieved      PT SHORT TERM GOAL #2   Title Patient will perform 5 sit to stands in 30 seconds or less with bilateral UE support to improve functional LE strength.    Baseline 4 sit to stands with UE support in 30 seconds 12/04/19    Time 3    Period Weeks    Status On-going             PT Long Term Goals - 12/11/19 1761      PT LONG TERM  GOAL #1   Title Patient will be independent with advanced HEP    Baseline no knowledge of HEP    Time 8    Period Weeks    Status On-going      PT LONG TERM GOAL #2   Title Patient will demonstrate 3+/5 right hip flexion MMT to improve ability to perform functional tasks.    Baseline 3-/5 right hip MMT eval    Time 8    Period Weeks    Status On-going      PT LONG TERM GOAL #3   Title Patient will ambulate with rolling walker or LRAD with step through gait pattern and equal step lengths to improve gait mechanics.    Baseline Ambulating with rolling walker with narrow base of support and varying step to and step through pattern, decreased right step length    Time 8    Period Weeks    Status On-going      PT LONG TERM GOAL #4   Title Patient will report ability to perform bed mobility, transfers, and ADLs independently with proper sequencing and technique.    Baseline Patient required assist this weekend due to back pain 12/11/19    Time 8    Period Weeks    Status On-going      PT LONG TERM GOAL #5   Title Patient will demonstrate 15+ lbs of right grip strength force to improve ability to perform fine motor tasks and functional tasks.    Baseline 12 lb R grip strength 12/11/19    Time 8    Period Weeks    Status On-going                 Plan - 12/18/19 0935    Clinical Impression Statement Patient arrived doing fairly well today. Patient guided through TEs with reports of fatigue. Seated rest breaks provided secondary to fatigue. Vitals WNL thoughout session. Patient instructed on pursed lip breathing secondary to reports of SOB. Vitals WNL upon completion of session.    Personal Factors and Comorbidities Comorbidity 3+    Comorbidities HTN, DM, history of COVID-19, CHF, Asthma,  L sided stroke  Examination-Activity Limitations Stand;Dressing;Hygiene/Grooming;Transfers;Locomotion Level;Bed Mobility;Stairs    Examination-Participation Restrictions Occupation     Stability/Clinical Decision Making Evolving/Moderate complexity    Clinical Decision Making Moderate    Rehab Potential Good    PT Frequency 2x / week    PT Duration 8 weeks    PT Treatment/Interventions ADLs/Self Care Home Management;Neuromuscular re-education;Manual techniques;Passive range of motion;Balance training;Therapeutic exercise;Therapeutic activities;Stair training;Functional mobility training;Gait training;Patient/family education    PT Next Visit Plan cont with POC and Monitor fatigue and vitals, nustep, gentle LE and UE strengthening and ROM exercises, gait training, please be mindful of overhead activities due to 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)    PT Home Exercise Plan see patient education    Consulted and Agree with Plan of Care Patient           Patient will benefit from skilled therapeutic intervention in order to improve the following deficits and impairments:  Abnormal gait, Difficulty walking, Pain, Decreased balance, Decreased activity tolerance, Decreased strength, Postural dysfunction, Decreased endurance  Visit Diagnosis: Difficulty in walking, not elsewhere classified  Muscle weakness (generalized)  Unsteadiness on feet     Problem List Patient Active Problem List   Diagnosis Date Noted  . Cerebritis 12/06/2019  . Cutaneous abscess of left foot   . Diabetic foot ([place])   . Acute systolic heart failure ([place])   . Embolic infarction ([place])   . Hyperglycemia   . Hypoxia   . Lower respiratory tract infection due to COVID-19 virus 10/10/2019  . Benign hypertension 07/23/2015  . Gastro-esophageal reflux disease with esophagitis 07/23/2015  . Gout 07/23/2015  . Obstructive sleep apnea 07/23/2015  . Snoring 07/23/2015  . Chronic idiopathic gout of multiple sites 06/02/2015  . Class 2 obesity in adult 06/02/2015  . Fibromyalgia 06/02/2015  . Irritable bowel syndrome with diarrhea 06/02/2015  . Mild persistent asthma 06/02/2015  .  Reaction, adjustment, with depressed mood, prolonged 06/02/2015  . Type 2 diabetes mellitus without complication, without long-term current use of insulin ([place]) 06/02/2015    Gabriela Eves, PT, DPT 12/18/2019, 9:53 AM  [facility] [place], [place], [address] Phone: [phone]   Fax:  [phone]  Name: Micheal Bell MRN: [id number] Date of Birth: Apr 15, 1970

## 2019-12-21 ENCOUNTER — Encounter: Payer: Medicaid Other | Admitting: Physical Therapy

## 2019-12-21 NOTE — Progress Notes (Signed)
Cardiology Office Note:   Date:  12/22/2019  NAME:  Micheal Bell    MRN: [id number] DOB:  [birth year]   PCP:  [name], MD  Cardiologist:  No primary care provider on file.   Referring MD: [name], MD   Chief Complaint  Patient presents with   New Patient (Initial Visit)   History of Present Illness:   Micheal Bell is a 49 y.o. male with a hx of DM, HLD, HTN who is being seen today for the evaluation of CHF at the request of [name], MD. Admitted in August with sepsis and MSSA bacteremia. Also had covid PNA. EF was reported as reduced 30-35%. On TEE for bacteremia EF normalized to 55%. He reports he still in physical therapy after his prolonged hospitalization.  This is discussed above.  Reports he is short of breath when he exerts himself.  His blood pressure is a bit low today.  96/82.  He reports low energy.  He also reports he has exertional chest pain.  Describes tightness in his chest when he walks.  Does not occur with all exertion but can occur at times.  Gets better with rest.  He did have a CT PE study that demonstrated coronary calcifications.  It is reassuring that his ejection fraction recovered.  I did review his echocardiogram and it is most likely consistent with a stress-induced cardiomyopathy.  His uncontrolled diabetes puts him at considerable risk for obstructive CAD.  Again, he had coronary calcifications.  His most recent LDL cholesterol is 99.  This not at goal.  I suspect this will get better with diabetes control.  He is now taking his medications.  Apparently before he was not taking any medications.  He is a never smoker.  Strong family history of heart disease.  Does not use any drugs or drink alcohol.  No evidence of heart failure on examination today.  EKG demonstrates nonspecific ST-T changes and likely LVH.  Symptoms of chest pain can occur daily.  He also is short of breath most days.  Problem List 1. DM -A1c 11.3 2. HTN 3. CHF -EF  30-35% in setting of sepsis  -EF improved to 55% at discharge (stress related) -insetting of MSSA sepsis/foot abscess 4. CVA -prior L frontal lobe 5. HLD -T chol 168, HDL 43, LDL 99, TG 130 6. DVT -RLE 11/11/2019  Past Medical History: Past Medical History:  Diagnosis Date   Arthritis    Asthma    Diabetes mellitus without complication ([facility])    DVT (deep venous thrombosis) ([facility])    Fibromyalgia    Gout    Hypertension    IBS (irritable bowel syndrome)    Vein disorder     Past Surgical History: Past Surgical History:  Procedure Laterality Date   FOOT SURGERY     TEE WITHOUT CARDIOVERSION N/A 10/24/2019   Procedure: TRANSESOPHAGEAL ECHOCARDIOGRAM (TEE);  Surgeon: [name], MD;  Location: [facility] ENDOSCOPY;  Service: Cardiovascular;  Laterality: N/A;    Current Medications: Current Meds  Medication Sig   albuterol (PROVENTIL HFA;VENTOLIN HFA) 108 (90 Base) MCG/ACT inhaler Inhale into the lungs every 6 (six) hours as needed for wheezing or shortness of breath.   allopurinol (ZYLOPRIM) 300 MG tablet Take 300 mg by mouth daily.   apixaban (ELIQUIS) 5 MG TABS tablet Take 1 tablet (5 mg total) by mouth 2 (two) times daily.   atorvastatin (LIPITOR) 40 MG tablet Take 1 tablet (40 mg total) by  mouth daily.   buPROPion (WELLBUTRIN XL) 150 MG 24 hr tablet Take 150 mg by mouth every morning.   cyclobenzaprine (FLEXERIL) 10 MG tablet Take 0.5 tablets (5 mg total) by mouth 2 (two) times daily as needed for muscle spasms.   Fluticasone Propionate, Inhal, (FLOVENT IN) Inhale into the lungs.   furosemide (LASIX) 20 MG tablet Take 20 mg by mouth.   gabapentin (NEURONTIN) 300 MG capsule Take 300 mg by mouth 3 (three) times daily.   HYDROcodone-acetaminophen (NORCO/VICODIN) 5-325 MG tablet Take 1 tablet by mouth every 6 (six) hours as needed for moderate pain.   insulin glargine (LANTUS) 100 UNIT/ML Solostar Pen Inject 20 Units into the skin daily.   levETIRAcetam  (KEPPRA) 500 MG tablet Take 1 tablet (500 mg total) by mouth 2 (two) times daily.   lidocaine (LIDODERM) 5 % Place 1 patch onto the skin daily. Remove & Discard patch within 12 hours or as directed by MD   mometasone-formoterol (DULERA) 100-5 MCG/ACT AERO Inhale 2 puffs into the lungs 2 (two) times daily.   montelukast (SINGULAIR) 10 MG tablet Take 10 mg by mouth at bedtime.   nitroGLYCERIN (NITRODUR - DOSED IN MG/24 HR) 0.2 mg/hr patch Place 0.2 mg onto the skin daily.   nortriptyline (PAMELOR) 25 MG capsule Take 25 mg by mouth at bedtime.   pantoprazole (PROTONIX) 40 MG tablet Take 40 mg by mouth daily.   PENTIPS 32G X 4 MM MISC See admin instructions. with insulin   predniSONE (DELTASONE) 10 MG tablet Take 10 mg by mouth daily with breakfast.   zolpidem (AMBIEN) 10 MG tablet Take 10 mg by mouth at bedtime as needed for sleep.   [DISCONTINUED] Metoprolol Tartrate 75 MG TABS Take 75 mg by mouth 2 (two) times daily.     Allergies:    Azithromycin and Lodine [etodolac]   Social History: Social History   Socioeconomic History   Marital status: Single    Spouse name: Not on file   Number of children: Not on file   Years of education: Not on file   Highest education level: Not on file  Occupational History   Occupation: disabled  Tobacco Use   Smoking status: Never Smoker   Smokeless tobacco: Never Used  Building services engineer Use: Never used  Substance and Sexual Activity   Alcohol use: Never   Drug use: Never   Sexual activity: Not on file  Other Topics Concern   Not on file  Social History Narrative   Not on file   Social Determinants of Health   Financial Resource Strain:    Difficulty of Paying Living Expenses: Not on file  Food Insecurity:    Worried About Running Out of Food in the Last Year: Not on file   [employer] of Food in the Last Year: Not on file  Transportation Needs:    Lack of Transportation (Medical): Not on file   Lack of  Transportation (Non-Medical): Not on file  Physical Activity:    Days of Exercise per Week: Not on file   Minutes of Exercise per Session: Not on file  Stress:    Feeling of Stress : Not on file  Social Connections:    Frequency of Communication with Friends and Family: Not on file   Frequency of Social Gatherings with Friends and Family: Not on file   Attends Religious Services: Not on file   Active Member of Clubs or Organizations: Not on file   Attends Club or  Organization Meetings: Not on file   Marital Status: Not on file     Family History: The patient's family history includes Diabetes in his mother; Heart disease in his father and mother.  ROS:   All other ROS reviewed and negative. Pertinent positives noted in the HPI.     EKGs/Labs/Other Studies Reviewed:   The following studies were personally reviewed by me today:  EKG:  EKG is ordered today.  The ekg ordered today demonstrates normal sinus rhythm, heart rate 94, LVH by voltage, nonspecific ST-T changes, and was personally reviewed by me.   TTE 10/12/2019 1. Extremely poor acoustic windows. LVEF appears to be depressed with  diffuse hypokinesis, worse in the mid/distal inferior/inferoseptal walls.  . Left ventricular ejection fraction, by estimation, is 30 to 35%. The  left ventricle has moderately  decreased function. The left ventricular internal cavity size was mildly  dilated. Left ventricular diastolic parameters are indeterminate.  2. Right ventricular systolic function is moderately reduced. The right  ventricular size is mildly enlarged.  3. The mitral valve is normal in structure. Trivial mitral valve  regurgitation.  4. The aortic valve is normal in structure. Aortic valve regurgitation is  not visualized.   TEE 10/24/2019 1. Left ventricular ejection fraction, by estimation, is 50 to 55%. The  left ventricle has low normal function. The left ventricle has no regional  wall motion  abnormalities.  2. Right ventricular systolic function is normal. The right ventricular  size is normal.  3. No left atrial/left atrial appendage thrombus was detected.  4. The mitral valve is normal in structure. Trivial mitral valve  regurgitation. No evidence of mitral stenosis.  5. The aortic valve is normal in structure. Aortic valve regurgitation is  not visualized. No aortic stenosis is present.  6. The inferior vena cava is normal in size with greater than 50%  respiratory variability, suggesting right atrial pressure of 3 mmHg.   Recent Labs: 10/26/2019: TSH 4.137 11/08/2019: ALT 18; Hemoglobin 9.3; Platelets 297 11/09/2019: B Natriuretic Peptide 28.8 11/14/2019: BUN 6; Creatinine, Ser 0.70; Magnesium 1.8; Potassium 3.6; Sodium 137   Recent Lipid Panel    Component Value Date/Time   CHOL 168 10/26/2019 0402   TRIG 130 10/26/2019 0402   HDL 43 10/26/2019 0402   CHOLHDL 3.9 10/26/2019 0402   VLDL 26 10/26/2019 0402   LDLCALC 99 10/26/2019 0402    Physical Exam:   VS:  BP 95/82    Pulse 94    Ht 6' (1.829 m)    Wt 238 lb 3.2 oz (108 kg)    SpO2 97%    BMI 32.31 kg/m    Wt Readings from Last 3 Encounters:  12/22/19 238 lb 3.2 oz (108 kg)  12/06/19 243 lb (110.2 kg)  11/13/19 254 lb 6.6 oz (115.4 kg)    General: Well nourished, well developed, in no acute distress Heart: Atraumatic, normal size  Eyes: PEERLA, EOMI  Neck: Supple, no JVD Endocrine: No thryomegaly Cardiac: Normal S1, S2; RRR; no murmurs, rubs, or gallops Lungs: Clear to auscultation bilaterally, no wheezing, rhonchi or rales  Abd: Soft, nontender, no hepatomegaly  Ext: No edema, pulses 2+ Musculoskeletal: No deformities, BUE and BLE strength normal and equal Skin: Warm and dry, no rashes   Neuro: Alert and oriented to person, place, time, and situation, CNII-XII grossly intact, no focal deficits  Psych: Normal mood and affect   ASSESSMENT:   Micheal Bell is a 49 y.o. male who presents for the  following: 1. Chest pain of uncertain etiology   2. SOB (shortness of breath)   3. Stress-induced cardiomyopathy   4. Mixed hyperlipidemia   5. Primary hypertension   6. Precordial pain     PLAN:   1. Chest pain of uncertain etiology 2. SOB (shortness of breath) -He has uncontrolled diabetes.  Elevated cholesterol.  Reports exertional chest pain.  Alleviated by rest.  The pain does not occur with all exertion.  He does have coronary calcifications on CT scan.  I recommended a nuclear medicine stress test for further evaluation.  Given that his heart did recover and he was quite sick in the hospital without significant troponin elevation, I suspect he will have no obstructive CAD. -He will continue aspirin and statin for now.  3. Stress-induced cardiomyopathy -Ejection fraction was a bit low on admission.  I did review the echocardiogram and likely is consistent with a stress-induced or Takotsubo cardiomyopathy.  His EF recovered during that stay.  We will evaluate his chest pain as above.  No strong indication for medication at this time.  4. Mixed hyperlipidemia -Most recent LDL cholesterol 99.  Not at goal.  He will continue Lipitor 40 mg daily.  Needs to get his diabetes under control.  I suspect his cholesterol level will improve with good glycemic control.  5. Primary hypertension -No indication for beta-blocker.  BPs are soft.  We will wean his beta-blocker.  He will take 1/2 tablet twice a day for 3 days and then 1/2 tablet once a day for 3 days.  Disposition: Return in about 3 months (around 03/23/2020).  Medication Adjustments/Labs and Tests Ordered: Current medicines are reviewed at length with the patient today.  Concerns regarding medicines are outlined above.  Orders Placed This Encounter  Procedures   Myocardial Perfusion Imaging   EKG 12-Lead   No orders of the defined types were placed in this encounter.   Patient Instructions  Medication Instructions:  Wean off  Metoprolol Take 1/2 tablet twice a day for 3 days then Take 1/2 tablet daily for 3 days Then stop  Continue all other medications *If you need a refill on your cardiac medications before your next appointment, please call your pharmacy*   Lab Work: None Ordered   Testing/Procedures: Schedule Pension scheme manager at [employer] office   Follow-Up: At [facility], you and your health needs are our priority.  As part of our continuing mission to provide you with exceptional heart care, we have created designated Provider Care Teams.  These Care Teams include your primary Cardiologist (physician) and Advanced Practice Providers (APPs -  Physician Assistants and Nurse Practitioners) who all work together to provide you with the care you need, when you need it.  We recommend signing up for the patient portal called "MyChart".  Sign up information is provided on this After Visit Summary.  MyChart is used to connect with patients for Virtual Visits (Telemedicine).  Patients are able to view lab/test results, encounter notes, upcoming appointments, etc.  Non-urgent messages can be sent to your provider as well.   To learn more about what you can do with MyChart, go to [email or web address].    Your next appointment:  3 months   The format for your next appointment: Office     Provider:  Dr.O'Neal     Signed, Lenna Gilford. Flora Lipps, MD [facility]  [address], [address] [place], [place] [address] [phone]  12/22/2019 9:04 AM

## 2019-12-22 ENCOUNTER — Encounter: Payer: Self-pay | Admitting: Cardiovascular Disease

## 2019-12-22 ENCOUNTER — Ambulatory Visit (INDEPENDENT_AMBULATORY_CARE_PROVIDER_SITE_OTHER): Payer: Medicaid Other | Admitting: Cardiovascular Disease

## 2019-12-22 ENCOUNTER — Other Ambulatory Visit: Payer: Self-pay

## 2019-12-22 VITALS — BP 95/82 | HR 94 | Ht 72.0 in | Wt 238.2 lb

## 2019-12-22 DIAGNOSIS — R079 Chest pain, unspecified: Secondary | ICD-10-CM | POA: Diagnosis not present

## 2019-12-22 DIAGNOSIS — R0602 Shortness of breath: Secondary | ICD-10-CM | POA: Diagnosis not present

## 2019-12-22 DIAGNOSIS — R072 Precordial pain: Secondary | ICD-10-CM

## 2019-12-22 DIAGNOSIS — I10 Essential (primary) hypertension: Secondary | ICD-10-CM | POA: Diagnosis not present

## 2019-12-22 DIAGNOSIS — E782 Mixed hyperlipidemia: Secondary | ICD-10-CM | POA: Diagnosis not present

## 2019-12-22 DIAGNOSIS — I5181 Takotsubo syndrome: Secondary | ICD-10-CM | POA: Diagnosis not present

## 2019-12-22 NOTE — Patient Instructions (Signed)
Medication Instructions:  Wean off Metoprolol Take 1/2 tablet twice a day for 3 days then Take 1/2 tablet daily for 3 days Then stop  Continue all other medications *If you need a refill on your cardiac medications before your next appointment, please call your pharmacy*   Lab Work: None Ordered   Testing/Procedures: Schedule Pension scheme manager at [employer] office   Follow-Up: At [facility], you and your health needs are our priority.  As part of our continuing mission to provide you with exceptional heart care, we have created designated Provider Care Teams.  These Care Teams include your primary Cardiologist (physician) and Advanced Practice Providers (APPs -  Physician Assistants and Nurse Practitioners) who all work together to provide you with the care you need, when you need it.  We recommend signing up for the patient portal called "MyChart".  Sign up information is provided on this After Visit Summary.  MyChart is used to connect with patients for Virtual Visits (Telemedicine).  Patients are able to view lab/test results, encounter notes, upcoming appointments, etc.  Non-urgent messages can be sent to your provider as well.   To learn more about what you can do with MyChart, go to [email or web address].    Your next appointment:  3 months   The format for your next appointment: Office     Provider:  Dr.O'Neal

## 2019-12-25 ENCOUNTER — Other Ambulatory Visit: Payer: Self-pay

## 2019-12-25 ENCOUNTER — Ambulatory Visit: Payer: Medicaid Other | Attending: Internal Medicine | Admitting: Physical Therapy

## 2019-12-25 DIAGNOSIS — R2681 Unsteadiness on feet: Secondary | ICD-10-CM | POA: Diagnosis not present

## 2019-12-25 DIAGNOSIS — R262 Difficulty in walking, not elsewhere classified: Secondary | ICD-10-CM | POA: Insufficient documentation

## 2019-12-25 DIAGNOSIS — M6281 Muscle weakness (generalized): Secondary | ICD-10-CM | POA: Diagnosis not present

## 2019-12-25 NOTE — Therapy (Signed)
[name] [place], [place], [address] Phone: [phone]   Fax:  [phone]  Physical Therapy Treatment  Patient Details  Name: Micheal Bell MRN: [id number] Date of Birth: 01-Feb-1971 Referring Provider (PT): Holli Humbles, MD   Encounter Date: 12/25/2019   PT End of Session - 12/25/19 0834    Visit Number 9    Number of Visits 18    Date for PT Re-Evaluation 01/25/20    PT Start Time 0815    PT Stop Time 0856    PT Time Calculation (min) 41 min    Activity Tolerance Patient tolerated treatment well    Behavior During Therapy [facility] for tasks assessed/performed           Past Medical History:  Diagnosis Date  . Arthritis   . Asthma   . Diabetes mellitus without complication ([place])   . DVT (deep venous thrombosis) ([place])   . Fibromyalgia   . Gout   . Hypertension   . IBS (irritable bowel syndrome)   . Vein disorder     Past Surgical History:  Procedure Laterality Date  . FOOT SURGERY    . TEE WITHOUT CARDIOVERSION N/A 10/24/2019   Procedure: TRANSESOPHAGEAL ECHOCARDIOGRAM (TEE);  Surgeon: Josue Hector, MD;  Location: [facility] ENDOSCOPY;  Service: Cardiovascular;  Laterality: N/A;    There were no vitals filed for this visit.   Subjective Assessment - 12/25/19 0825    Subjective COVID-19 screening performed upon arrival. Patient reports doing good today.    Pertinent History HTN, DM, history of COVID-19, CHF, Asthma    Limitations House hold activities;Standing;Walking    How long can you walk comfortably? short periods around home    Patient Stated Goals improve mobility    Currently in Pain? Yes    Pain Score 7     Pain Location Back    Pain Orientation Lower    Pain Type Chronic pain    Pain Onset More than a month ago    Pain Frequency Intermittent    Aggravating Factors  prolong activity    Pain Relieving Factors at rest                             [facility] Adult PT  Treatment/Exercise - 12/25/19 0001      Lumbar Exercises: Aerobic   Nustep L3 x12 min UE/LE activity   vitals WNL post nustep     Lumbar Exercises: Seated   Other Seated Lumbar Exercises seated trunk rotations with 4# ball x20      Knee/Hip Exercises: Standing   Heel Raises Both;2 sets;10 reps    Hip Flexion Stengthening;Both;2 sets;10 reps;Knee bent    Hip Flexion Limitations 6" box taps    Other Standing Knee Exercises walking forward backward and side to side x several each way no UE support to int      Other Standing Knee Exercises Post standing exercises O2 98 HR 118      Knee/Hip Exercises: Seated   Long Arc Quad Strengthening;Both;10 reps;Weights;4 sets    [employer] Weight 3 lbs.    Other Seated Knee/Hip Exercises hip abd with green band x30    Marching Strengthening;Both;2 sets;10 reps;Weights    Marching Weights 3 lbs.    Hamstring Curl Strengthening;Both;20 reps    Hamstring Limitations green band    Sit to Sand 5 reps;with UE support   low surface  PT Short Term Goals - 12/25/19 0903      PT SHORT TERM GOAL #1   Title Patient will be independent with HEP    Baseline Met 11/27/19    Time 3    Period Weeks    Status Achieved      PT SHORT TERM GOAL #2   Title Patient will perform 5 sit to stands in 30 seconds or less with bilateral UE support to improve functional LE strength.    Baseline able to progress with low seat yet slow pace 12/25/19    Time 3    Period Weeks    Status On-going             PT Long Term Goals - 12/25/19 0904      PT LONG TERM GOAL #1   Title Patient will be independent with advanced HEP    Time 8    Period Weeks    Status On-going      PT LONG TERM GOAL #2   Title Patient will demonstrate 3+/5 right hip flexion MMT to improve ability to perform functional tasks.    Baseline 3-/5 right hip MMT eval    Time 8    Period Weeks    Status On-going      PT LONG TERM GOAL #3   Title Patient will  ambulate with rolling walker or LRAD with step through gait pattern and equal step lengths to improve gait mechanics.    Baseline Ambulating with rolling walker with narrow base of support and varying step to and step through pattern, decreased right step length    Time 8    Period Weeks    Status On-going      PT LONG TERM GOAL #4   Title Patient will report ability to perform bed mobility, transfers, and ADLs independently with proper sequencing and technique.    Time 8    Period Weeks    Status On-going      PT LONG TERM GOAL #5   Title Patient will demonstrate 15+ lbs of right grip strength force to improve ability to perform fine motor tasks and functional tasks.    Baseline 12 lb R grip strength 12/11/19    Time 8    Period Weeks    Status On-going                 Plan - 12/25/19 0857    Clinical Impression Statement Patient tolerated treatment well today and vitals WNL throughout. Patient able to progress standing activity today and discussed wearing supportive shoes vs bed slippers to allow for a safe standing progression. Patient able to perfrom walking 4 ways with int to no UE support and SBA for safety. Patient has improved stand to sit with a low surface today. Patient continues to have limitations with prolong activity and back pain. Patient doing core sets at home per reported. Goals progressing.    Personal Factors and Comorbidities Comorbidity 3+    Comorbidities HTN, DM, history of COVID-19, CHF, Asthma,  L sided stroke    Examination-Activity Limitations Stand;Dressing;Hygiene/Grooming;Transfers;Locomotion Level;Bed Mobility;Stairs    Examination-Participation Restrictions Occupation    Stability/Clinical Decision Making Evolving/Moderate complexity    Rehab Potential Good    PT Frequency 2x / week    PT Duration 8 weeks    PT Treatment/Interventions ADLs/Self Care Home Management;Neuromuscular re-education;Manual techniques;Passive range of motion;Balance  training;Therapeutic exercise;Therapeutic activities;Stair training;Functional mobility training;Gait training;Patient/family education    PT Next Visit Plan cont with  POC and Monitor fatigue and vitals. 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 Cerebritis 12/06/2019  . Cutaneous abscess of left foot   . Diabetic foot ([place])   . Acute systolic heart failure ([place])   . Embolic infarction ([place])   . Hyperglycemia   . Hypoxia   . Lower respiratory tract infection due to COVID-19 virus 10/10/2019  . Benign hypertension 07/23/2015  . Gastro-esophageal reflux disease with esophagitis 07/23/2015  . Gout 07/23/2015  . Obstructive sleep apnea 07/23/2015  . Snoring 07/23/2015  . Chronic idiopathic gout of multiple sites 06/02/2015  . Class 2 obesity in adult 06/02/2015  . Fibromyalgia 06/02/2015  . Irritable bowel syndrome with diarrhea 06/02/2015  . Mild persistent asthma 06/02/2015  . Reaction, adjustment, with depressed mood, prolonged 06/02/2015  . Type 2 diabetes mellitus without complication, without long-term current use of insulin ([place]) 06/02/2015    Noriko Macari P, PTA 12/25/2019, 9:05 AM  [facility] [place], [place], [address] Phone:  [phone]   Fax:  [phone]  Name: ANSHUL MEDDINGS MRN: [id number] Date of Birth: 1970-11-14

## 2019-12-26 ENCOUNTER — Telehealth (HOSPITAL_COMMUNITY): Payer: Self-pay

## 2019-12-26 NOTE — Telephone Encounter (Signed)
Spoke with the patient, Detailed instructions given. He stated that he understood. Asked to call back with any questions. S.Cardarius Senat EMTP

## 2019-12-28 ENCOUNTER — Ambulatory Visit: Payer: Medicaid Other | Admitting: Physical Therapy

## 2019-12-28 ENCOUNTER — Other Ambulatory Visit: Payer: Self-pay

## 2019-12-28 DIAGNOSIS — R262 Difficulty in walking, not elsewhere classified: Secondary | ICD-10-CM

## 2019-12-28 DIAGNOSIS — M6281 Muscle weakness (generalized): Secondary | ICD-10-CM

## 2019-12-28 DIAGNOSIS — R2681 Unsteadiness on feet: Secondary | ICD-10-CM | POA: Diagnosis not present

## 2019-12-28 NOTE — Therapy (Signed)
[name] [place], [place], [address] Phone: [phone]   Fax:  [phone]  Physical Therapy Treatment  Progress Note Reporting Period 11/23/2019 to 12/28/2019  See note below for Objective Data and Assessment of Progress/Goals. Patient's goals are ongoing at this time but has achieved LTG #5. Gabriela Eves, PT, DPT     Patient Details  Name: Micheal Bell MRN: [id number] Date of Birth: [birth year] Referring Provider (PT): Holli Humbles, MD   Encounter Date: 12/28/2019   PT End of Session - 12/28/19 0828    Visit Number 10    Number of Visits 18    Date for PT Re-Evaluation 01/25/20    PT Start Time 0815    PT Stop Time 0903    PT Time Calculation (min) 48 min    Activity Tolerance Patient tolerated treatment well    Behavior During Therapy [facility] for tasks assessed/performed           Past Medical History:  Diagnosis Date  . Arthritis   . Asthma   . Diabetes mellitus without complication ([place])   . DVT (deep venous thrombosis) ([place])   . Fibromyalgia   . Gout   . Hypertension   . IBS (irritable bowel syndrome)   . Vein disorder     Past Surgical History:  Procedure Laterality Date  . FOOT SURGERY    . TEE WITHOUT CARDIOVERSION N/A 10/24/2019   Procedure: TRANSESOPHAGEAL ECHOCARDIOGRAM (TEE);  Surgeon: Josue Hector, MD;  Location: [facility] ENDOSCOPY;  Service: Cardiovascular;  Laterality: N/A;    There were no vitals filed for this visit.   Subjective Assessment - 12/28/19 0823    Subjective COVID-19 screening performed upon arrival. Patient reports doing good today and no complaints after last treatment    Patient is accompained by: Family member    Pertinent History HTN, DM, history of COVID-19, CHF, Asthma    Limitations House hold activities;Standing;Walking    How long can you walk comfortably? short periods around home    Patient Stated Goals improve mobility    Currently in Pain? Yes     Pain Score --   no number provided   Pain Location Back    Pain Orientation Lower    Pain Descriptors / Indicators Discomfort    Pain Type Chronic pain    Pain Onset More than a month ago    Pain Frequency Intermittent    Aggravating Factors  prolong activity/prolong standing    Pain Relieving Factors rest              [facility] PT Assessment - 12/28/19 0001      Strength   Right/Left hand Right    Right Hand Grip (lbs) 26                         [facility] Adult PT Treatment/Exercise - 12/28/19 0001      Lumbar Exercises: Aerobic   Nustep L3 x37mn UE/LE 49-52SPM   O2 97% / 135 HR after     Lumbar Exercises: Standing   Other Standing Lumbar Exercises resistance walking with green XTS forward/backward and side to side x10 each way SBA/CGA for safety      Knee/Hip Exercises: Standing   Hip Flexion Stengthening;Both;2 sets;10 reps;Knee bent   no UE needed yet SBA for safety   Hip Flexion Limitations 6" box taps    Other Standing Knee Exercises standing on airex with small BOS x358m SBA  for safety    Other Standing Knee Exercises grey ball for waist level rotation 2x10 then small diagnols waist level with trunk rotation 2x10      Knee/Hip Exercises: Seated   Long Arc Quad Strengthening;Both;3 sets;10 reps    Long Arc Quad Weight 4 lbs.    Other Seated Knee/Hip Exercises hip abd with green band x30    Hamstring Curl Strengthening;Both;20 reps    Hamstring Limitations green band                    PT Short Term Goals - 12/28/19 0913      PT SHORT TERM GOAL #1   Title Patient will be independent with HEP    Baseline Met 11/27/19    Time 3    Period Weeks    Status Achieved      PT SHORT TERM GOAL #2   Title Patient will perform 5 sit to stands in 30 seconds or less with bilateral UE support to improve functional LE strength.    Baseline able to progress with low seat yet slow pace 12/25/19    Time 3    Period Weeks    Status On-going              PT Long Term Goals - 12/28/19 0913      PT LONG TERM GOAL #1   Title Patient will be independent with advanced HEP    Time 8    Period Weeks    Status On-going      PT LONG TERM GOAL #2   Title Patient will demonstrate 3+/5 right hip flexion MMT to improve ability to perform functional tasks.    Baseline 3-/5 right hip MMT eval    Time 8    Period Weeks    Status On-going      PT LONG TERM GOAL #3   Title Patient will ambulate with rolling walker or LRAD with step through gait pattern and equal step lengths to improve gait mechanics.    Baseline Ambulating with rolling walker with narrow base of support and varying step to and step through pattern, decreased right step length    Time 8    Period Weeks    Status On-going      PT LONG TERM GOAL #4   Title Patient will report ability to perform bed mobility, transfers, and ADLs independently with proper sequencing and technique.    Baseline Patient required less yet some assist this weekend due to back pain 11/4//21    Time 8    Period Weeks    Status On-going      PT LONG TERM GOAL #5   Title Patient will demonstrate 15+ lbs of right grip strength force to improve ability to perform fine motor tasks and functional tasks.    Baseline Met 26# 12/28/19    Time 8    Period Weeks    Status Achieved                 Plan - 12/28/19 0916    Clinical Impression Statement Patient tolerated treatment well today. Today focused on increased activity tolerance on nustep and with standing tolerance. Patient vitals WNL throughout and able to perform standing with SBA and little UE support. Patient able to progress with standing balance and trunck rotation to help improve movement with gait and mobility.  Patient has improved tolerance and balance today. Patient improved grip strength and met LTG #5 with remaining goals  progressing due to limitations with strength and mobility.    Personal Factors and Comorbidities Comorbidity 3+     Comorbidities HTN, DM, history of COVID-19, CHF, Asthma,  L sided stroke    Examination-Activity Limitations Stand;Dressing;Hygiene/Grooming;Transfers;Locomotion Level;Bed Mobility;Stairs    Examination-Participation Restrictions Occupation    Stability/Clinical Decision Making Evolving/Moderate complexity    Rehab Potential Good    PT Frequency 2x / week    PT Duration 8 weeks    PT Treatment/Interventions ADLs/Self Care Home Management;Neuromuscular re-education;Manual techniques;Passive range of motion;Balance training;Therapeutic exercise;Therapeutic activities;Stair training;Functional mobility training;Gait training;Patient/family education    PT Next Visit Plan cont with POC and Monitor fatigue and vitals. gentle LE and UE strengthening, core and ROM exercises, gait training and resistance walking    Consulted and Agree with Plan of Care Patient           Patient will benefit from skilled therapeutic intervention in order to improve the following deficits and impairments:  Abnormal gait, Difficulty walking, Pain, Decreased balance, Decreased activity tolerance, Decreased strength, Postural dysfunction, Decreased endurance  Visit Diagnosis: Difficulty in walking, not elsewhere classified  Muscle weakness (generalized)  Unsteadiness on feet     Problem List Patient Active Problem List   Diagnosis Date Noted  . Cerebritis 12/06/2019  . Cutaneous abscess of left foot   . Diabetic foot ([place])   . Acute systolic heart failure ([place]   . Embolic infarction ([place])   . Hyperglycemia   . Hypoxia   . Lower respiratory tract infection due to COVID-19 virus 10/10/2019  . Benign hypertension 07/23/2015  . Gastro-esophageal reflux disease with esophagitis 07/23/2015  . Gout 07/23/2015  . Obstructive sleep apnea 07/23/2015  . Snoring 07/23/2015  . Chronic idiopathic gout of multiple sites 06/02/2015  . Class 2 obesity in adult 06/02/2015  . Fibromyalgia 06/02/2015  . Irritable  bowel syndrome with diarrhea 06/02/2015  . Mild persistent asthma 06/02/2015  . Reaction, adjustment, with depressed mood, prolonged 06/02/2015  . Type 2 diabetes mellitus without complication, without long-term current use of insulin ([place]) 06/02/2015   Ladean Raya, PTA 12/28/19 9:26 AM  [name] [place], [place], [address] Phone: [phone]   Fax:  [phone]  Name: Micheal Bell MRN: [id number] Date of Birth: 03-22-1970

## 2020-01-01 ENCOUNTER — Other Ambulatory Visit: Payer: Self-pay

## 2020-01-01 ENCOUNTER — Ambulatory Visit: Payer: Medicaid Other | Admitting: Physical Therapy

## 2020-01-01 DIAGNOSIS — R2681 Unsteadiness on feet: Secondary | ICD-10-CM | POA: Diagnosis not present

## 2020-01-01 DIAGNOSIS — M6281 Muscle weakness (generalized): Secondary | ICD-10-CM

## 2020-01-01 DIAGNOSIS — R262 Difficulty in walking, not elsewhere classified: Secondary | ICD-10-CM

## 2020-01-01 NOTE — Therapy (Signed)
[name] [place], [place], [address] Phone: [phone]   Fax:  [phone]  Physical Therapy Treatment  Patient Details  Name: Micheal Bell MRN: [id number] Date of Birth: 01/19/1971 Referring Provider (PT): Holli Humbles, MD   Encounter Date: 01/01/2020   PT End of Session - 01/01/20 0854    Visit Number 11    Number of Visits 18    Date for PT Re-Evaluation 01/25/20    PT Start Time 0815    PT Stop Time 0859    PT Time Calculation (min) 44 min    Activity Tolerance Patient tolerated treatment well    Behavior During Therapy [facility] for tasks assessed/performed           Past Medical History:  Diagnosis Date  . Arthritis   . Asthma   . Diabetes mellitus without complication ([place])   . DVT (deep venous thrombosis) ([place])   . Fibromyalgia   . Gout   . Hypertension   . IBS (irritable bowel syndrome)   . Vein disorder     Past Surgical History:  Procedure Laterality Date  . FOOT SURGERY    . TEE WITHOUT CARDIOVERSION N/A 10/24/2019   Procedure: TRANSESOPHAGEAL ECHOCARDIOGRAM (TEE);  Surgeon: Josue Hector, MD;  Location: [facility] ENDOSCOPY;  Service: Cardiovascular;  Laterality: N/A;    There were no vitals filed for this visit.   Subjective Assessment - 01/01/20 0824    Subjective COVID-19 screening performed upon arrival. Patient arrived with good report after last treatment.    Pertinent History HTN, DM, history of COVID-19, CHF, Asthma    Limitations House hold activities;Standing;Walking    How long can you walk comfortably? short periods around home    Patient Stated Goals improve mobility    Currently in Pain? Yes    Pain Score --   no number given   Pain Location Back    Pain Orientation Lower    Pain Descriptors / Indicators Discomfort    Pain Type Chronic pain    Pain Onset More than a month ago    Pain Frequency Intermittent    Aggravating Factors  prolong activity    Pain Relieving Factors  at rest                             [facility] Adult PT Treatment/Exercise - 01/01/20 0001      Lumbar Exercises: Aerobic   Nustep L3 x51mn UE/LE 48-55 SPM   HR O2 %     Knee/Hip Exercises: Standing   Hip Flexion Limitations 6" box taps    Hip Abduction Stengthening;Both;2 sets;10 reps;Knee straight    Other Standing Knee Exercises standing on airex with small BOS x360m SBA for safety    Other Standing Knee Exercises grey ball for waist level rotation 2x10 then small diagnols waist level with trunk rotation 2x10      Knee/Hip Exercises: Seated   Long Arc Quad Strengthening;Both;3 sets;10 reps    Long Arc Quad Weight 4 lbs.    Other Seated Knee/Hip Exercises hip abd with green band x30    Marching Strengthening;Both;2 sets;10 reps;Weights    Marching Limitations green band    Hamstring Curl Strengthening;Both;20 reps    Hamstring Limitations green band    Sit to Sand 10 reps;without UE support   able to complete 6 in 30 seconds  PT Short Term Goals - 01/01/20 0839      PT SHORT TERM GOAL #1   Title Patient will be independent with HEP    Baseline Met 11/27/19    Time 3    Period Weeks    Status Achieved      PT SHORT TERM GOAL #2   Title Patient will perform 5 sit to stands in 30 seconds or less with bilateral UE support to improve functional LE strength.    Baseline 6 sit to stand in 30 seconds Met 01/01/20    Time 3    Period Weeks    Status Achieved             PT Long Term Goals - 01/01/20 1638      PT LONG TERM GOAL #1   Title Patient will be independent with advanced HEP    Time 8    Period Weeks    Status On-going      PT LONG TERM GOAL #2   Title Patient will demonstrate 3+/5 right hip flexion MMT to improve ability to perform functional tasks.    Baseline 3-/5 right hip MMT eval    Time 8    Period Weeks    Status On-going      PT LONG TERM GOAL #3   Title Patient will ambulate with rolling walker or LRAD  with step through gait pattern and equal step lengths to improve gait mechanics.    Baseline Ambulating with rolling walker with narrow base of support and varying step to and step through pattern, decreased right step length    Time 8    Period Weeks    Status On-going      PT LONG TERM GOAL #4   Title Patient will report ability to perform bed mobility, transfers, and ADLs independently with proper sequencing and technique.    Baseline Patient required less yet some assist this weekend due to back pain 11/8//21    Time 8    Period Weeks    Status On-going      PT LONG TERM GOAL #5   Title Patient will demonstrate 15+ lbs of right grip strength force to improve ability to perform fine motor tasks and functional tasks.    Baseline Met 26# 12/28/19    Time 8    Period Weeks    Status Achieved                 Plan - 01/01/20 4665    Clinical Impression Statement Patient tolerated treatment well today. Patient able to keep vitals WNL today. Patient progressing with all activities. Patient able to sit to stand x6 in 30 seconds and able to use less UE support. Patient continues to have some difficulty with ADL's per reported. Patient going for stress test tomorrow so limited standing exercises today. STG met today with remaining LTG's ongoing.    Personal Factors and Comorbidities Comorbidity 3+    Comorbidities HTN, DM, history of COVID-19, CHF, Asthma,  L sided stroke    Examination-Activity Limitations Stand;Dressing;Hygiene/Grooming;Transfers;Locomotion Level;Bed Mobility;Stairs    Examination-Participation Restrictions Occupation    Stability/Clinical Decision Making Evolving/Moderate complexity    Rehab Potential Good    PT Frequency 2x / week    PT Duration 8 weeks    PT Treatment/Interventions ADLs/Self Care Home Management;Neuromuscular re-education;Manual techniques;Passive range of motion;Balance training;Therapeutic exercise;Therapeutic activities;Stair  training;Functional mobility training;Gait training;Patient/family education    PT Next Visit Plan cont with POC and Monitor fatigue and  vitals. gentle LE and UE strengthening, core and ROM exercises, gait training and resistance walking    Consulted and Agree with Plan of Care Patient           Patient will benefit from skilled therapeutic intervention in order to improve the following deficits and impairments:  Abnormal gait, Difficulty walking, Pain, Decreased balance, Decreased activity tolerance, Decreased strength, Postural dysfunction, Decreased endurance  Visit Diagnosis: Difficulty in walking, not elsewhere classified  Muscle weakness (generalized)  Unsteadiness on feet     Problem List Patient Active Problem List   Diagnosis Date Noted  . Cerebritis 12/06/2019  . Cutaneous abscess of left foot   . Diabetic foot ([place])   . Acute systolic heart failure ([place])   . Embolic infarction ([place])   . Hyperglycemia   . Hypoxia   . Lower respiratory tract infection due to COVID-19 virus 10/10/2019  . Benign hypertension 07/23/2015  . Gastro-esophageal reflux disease with esophagitis 07/23/2015  . Gout 07/23/2015  . Obstructive sleep apnea 07/23/2015  . Snoring 07/23/2015  . Chronic idiopathic gout of multiple sites 06/02/2015  . Class 2 obesity in adult 06/02/2015  . Fibromyalgia 06/02/2015  . Irritable bowel syndrome with diarrhea 06/02/2015  . Mild persistent asthma 06/02/2015  . Reaction, adjustment, with depressed mood, prolonged 06/02/2015  . Type 2 diabetes mellitus without complication, without long-term current use of insulin ([place]) 06/02/2015    Taitum Menton P, PTA 01/01/2020, 9:05 AM  [facility] [place], [place], [address] Phone: [phone]   Fax:  [phone]  Name: Micheal Bell MRN: [id number] Date of Birth: 1970-04-12

## 2020-01-02 ENCOUNTER — Encounter (HOSPITAL_COMMUNITY): Payer: Medicaid Other

## 2020-01-02 ENCOUNTER — Ambulatory Visit: Payer: Self-pay | Admitting: Podiatry

## 2020-01-04 ENCOUNTER — Ambulatory Visit: Payer: Medicaid Other | Admitting: Podiatry

## 2020-01-04 ENCOUNTER — Ambulatory Visit: Payer: Medicaid Other | Admitting: Physical Therapy

## 2020-01-04 ENCOUNTER — Other Ambulatory Visit: Payer: Self-pay

## 2020-01-04 DIAGNOSIS — R2681 Unsteadiness on feet: Secondary | ICD-10-CM | POA: Diagnosis not present

## 2020-01-04 DIAGNOSIS — R262 Difficulty in walking, not elsewhere classified: Secondary | ICD-10-CM | POA: Diagnosis not present

## 2020-01-04 DIAGNOSIS — M6281 Muscle weakness (generalized): Secondary | ICD-10-CM

## 2020-01-04 NOTE — Therapy (Signed)
[name] [place], [place], [address] Phone: [phone]   Fax:  [phone]  Physical Therapy Treatment  Patient Details  Name: Micheal Bell MRN: [id number] Date of Birth: [birth year] Referring Provider (PT): Holli Humbles, MD   Encounter Date: 01/04/2020   PT End of Session - 01/04/20 0858    Visit Number 12    Number of Visits 18    Date for PT Re-Evaluation 01/25/20    PT Start Time 0814    PT Stop Time 0900    PT Time Calculation (min) 46 min    Activity Tolerance Patient tolerated treatment well    Behavior During Therapy [facility] for tasks assessed/performed           Past Medical History:  Diagnosis Date  . Arthritis   . Asthma   . Diabetes mellitus without complication ([place])   . DVT (deep venous thrombosis) ([place])   . Fibromyalgia   . Gout   . Hypertension   . IBS (irritable bowel syndrome)   . Vein disorder     Past Surgical History:  Procedure Laterality Date  . FOOT SURGERY    . TEE WITHOUT CARDIOVERSION N/A 10/24/2019   Procedure: TRANSESOPHAGEAL ECHOCARDIOGRAM (TEE);  Surgeon: Josue Hector, MD;  Location: [facility] ENDOSCOPY;  Service: Cardiovascular;  Laterality: N/A;    There were no vitals filed for this visit.   Subjective Assessment - 01/04/20 0828    Subjective COVID-19 screening performed upon arrival. Patient reported doing well after last treatment and was unable to get stress test due to insurance    Pertinent History HTN, DM, history of COVID-19, CHF, Asthma    Limitations House hold activities;Standing;Walking    How long can you walk comfortably? short periods around home    Patient Stated Goals improve mobility    Currently in Pain? Yes    Pain Score --   no number given   Pain Location Back    Pain Orientation Lower    Pain Descriptors / Indicators Discomfort    Pain Type Chronic pain    Pain Onset More than a month ago    Pain Frequency Intermittent    Aggravating  Factors  prolong activity    Pain Relieving Factors rest                             [facility] Adult PT Treatment/Exercise - 01/04/20 0001      Lumbar Exercises: Aerobic   Nustep L2 x 16 1/2 min ( 3 laps) LE only, with SPM above 80   O2 92 HR 125     Knee/Hip Exercises: Standing   Hip Flexion Limitations 6" box taps   2x10 each LE   Rocker Board 2 minutes   SBA, no to int UE support   Other Standing Knee Exercises standing on airex with small BOS x55mn SBA for safety    Other Standing Knee Exercises grey ball for waist level rotation 2x10       Knee/Hip Exercises: Seated   Long Arc Quad Strengthening;Both;3 sets;10 reps    Long Arc Quad Weight 4 lbs.    Other Seated Knee/Hip Exercises hip abd with green band x30    Marching Strengthening;Both;2 sets;10 reps;Weights    Marching Limitations green band    Hamstring Curl Strengthening;Both;20 reps    Hamstring Limitations green band  PT Short Term Goals - 01/01/20 0839      PT SHORT TERM GOAL #1   Title Patient will be independent with HEP    Baseline Met 11/27/19    Time 3    Period Weeks    Status Achieved      PT SHORT TERM GOAL #2   Title Patient will perform 5 sit to stands in 30 seconds or less with bilateral UE support to improve functional LE strength.    Baseline 6 sit to stand in 30 seconds Met 01/01/20    Time 3    Period Weeks    Status Achieved             PT Long Term Goals - 01/01/20 1470      PT LONG TERM GOAL #1   Title Patient will be independent with advanced HEP    Time 8    Period Weeks    Status On-going      PT LONG TERM GOAL #2   Title Patient will demonstrate 3+/5 right hip flexion MMT to improve ability to perform functional tasks.    Baseline 3-/5 right hip MMT eval    Time 8    Period Weeks    Status On-going      PT LONG TERM GOAL #3   Title Patient will ambulate with rolling walker or LRAD with step through gait pattern and equal step  lengths to improve gait mechanics.    Baseline Ambulating with rolling walker with narrow base of support and varying step to and step through pattern, decreased right step length    Time 8    Period Weeks    Status On-going      PT LONG TERM GOAL #4   Title Patient will report ability to perform bed mobility, transfers, and ADLs independently with proper sequencing and technique.    Baseline Patient required less yet some assist this weekend due to back pain 11/8//21    Time 8    Period Weeks    Status On-going      PT LONG TERM GOAL #5   Title Patient will demonstrate 15+ lbs of right grip strength force to improve ability to perform fine motor tasks and functional tasks.    Baseline Met 26# 12/28/19    Time 8    Period Weeks    Status Achieved                 Plan - 01/04/20 0900    Clinical Impression Statement Patient tolerated treatment well today. patient continues to improve with activity tolerance and reps with exercises with vitals WNL. Patient doing well with balance activities and core control today. Patient has limitations with right UE pain yet able to modify exercises to improve functional independence. Goals progressing.    Personal Factors and Comorbidities Comorbidity 3+    Comorbidities HTN, DM, history of COVID-19, CHF, Asthma,  L sided stroke    Examination-Activity Limitations Stand;Dressing;Hygiene/Grooming;Transfers;Locomotion Level;Bed Mobility;Stairs    Examination-Participation Restrictions Occupation    Stability/Clinical Decision Making Evolving/Moderate complexity    Rehab Potential Good    PT Frequency 2x / week    PT Duration 8 weeks    PT Treatment/Interventions ADLs/Self Care Home Management;Neuromuscular re-education;Manual techniques;Passive range of motion;Balance training;Therapeutic exercise;Therapeutic activities;Stair training;Functional mobility training;Gait training;Patient/family education    PT Next Visit Plan cont with POC and  Monitor fatigue and vitals. gentle LE and UE strengthening, core and ROM exercises, gait training and resistance walking  Consulted and Agree with Plan of Care Patient           Patient will benefit from skilled therapeutic intervention in order to improve the following deficits and impairments:  Abnormal gait, Difficulty walking, Pain, Decreased balance, Decreased activity tolerance, Decreased strength, Postural dysfunction, Decreased endurance  Visit Diagnosis: Difficulty in walking, not elsewhere classified  Muscle weakness (generalized)  Unsteadiness on feet     Problem List Patient Active Problem List   Diagnosis Date Noted  . Cerebritis 12/06/2019  . Cutaneous abscess of left foot   . Diabetic foot ([place])   . Acute systolic heart failure ([place])   . Embolic infarction ([place])   . Hyperglycemia   . Hypoxia   . Lower respiratory tract infection due to COVID-19 virus 10/10/2019  . Benign hypertension 07/23/2015  . Gastro-esophageal reflux disease with esophagitis 07/23/2015  . Gout 07/23/2015  . Obstructive sleep apnea 07/23/2015  . Snoring 07/23/2015  . Chronic idiopathic gout of multiple sites 06/02/2015  . Class 2 obesity in adult 06/02/2015  . Fibromyalgia 06/02/2015  . Irritable bowel syndrome with diarrhea 06/02/2015  . Mild persistent asthma 06/02/2015  . Reaction, adjustment, with depressed mood, prolonged 06/02/2015  . Type 2 diabetes mellitus without complication, without long-term current use of insulin ([place]) 06/02/2015    Cadance Raus P, PTA 01/04/2020, 9:08 AM  [facility] [place], [place], [address] Phone: [phone]   Fax:  [phone]  Name: Micheal Bell MRN: [id number] Date of Birth: October 02, 1970

## 2020-01-08 ENCOUNTER — Ambulatory Visit: Payer: Medicaid Other | Admitting: Physical Therapy

## 2020-01-08 ENCOUNTER — Telehealth (HOSPITAL_COMMUNITY): Payer: Self-pay | Admitting: *Deleted

## 2020-01-08 ENCOUNTER — Other Ambulatory Visit: Payer: Self-pay

## 2020-01-08 DIAGNOSIS — M6281 Muscle weakness (generalized): Secondary | ICD-10-CM | POA: Diagnosis not present

## 2020-01-08 DIAGNOSIS — R262 Difficulty in walking, not elsewhere classified: Secondary | ICD-10-CM

## 2020-01-08 DIAGNOSIS — R2681 Unsteadiness on feet: Secondary | ICD-10-CM | POA: Diagnosis not present

## 2020-01-08 NOTE — Telephone Encounter (Signed)
Patient given detailed instructions  For his test on 01/10/20 Patient notified to arrive 15 minutes early and that it is imperative to arrive on time for appointment to keep from having the test rescheduled.  If you need to cancel or reschedule your appointment, please call the office within 24 hours of your appointment. . Patient verbalized understanding. Ricky Ala

## 2020-01-08 NOTE — Therapy (Signed)
[name] [place], [place], [address] Phone: [phone]   Fax:  [phone]  Physical Therapy Treatment  Patient Details  Name: Micheal Bell MRN: [id number] Date of Birth: 04-17-1970 Referring Provider (PT): Holli Humbles, MD   Encounter Date: 01/08/2020   PT End of Session - 01/08/20 0823    Visit Number 13    Number of Visits 18    Date for PT Re-Evaluation 01/25/20    PT Start Time 0815    PT Stop Time 0858    PT Time Calculation (min) 43 min    Activity Tolerance Patient tolerated treatment well    Behavior During Therapy [facility] for tasks assessed/performed           Past Medical History:  Diagnosis Date  . Arthritis   . Asthma   . Diabetes mellitus without complication ([place])   . DVT (deep venous thrombosis) ([place])   . Fibromyalgia   . Gout   . Hypertension   . IBS (irritable bowel syndrome)   . Vein disorder     Past Surgical History:  Procedure Laterality Date  . FOOT SURGERY    . TEE WITHOUT CARDIOVERSION N/A 10/24/2019   Procedure: TRANSESOPHAGEAL ECHOCARDIOGRAM (TEE);  Surgeon: Josue Hector, MD;  Location: [facility] ENDOSCOPY;  Service: Cardiovascular;  Laterality: N/A;    There were no vitals filed for this visit.   Subjective Assessment - 01/08/20 0820    Subjective COVID-19 screening performed upon arrival. Patient reported doing well today with some ongoing back pain    Pertinent History HTN, DM, history of COVID-19, CHF, Asthma    Limitations House hold activities;Standing;Walking    How long can you walk comfortably? short periods around home    Patient Stated Goals improve mobility    Currently in Pain? Yes    Pain Score --   no number rating given "just hurts"   Pain Location Back    Pain Orientation Lower    Pain Descriptors / Indicators Discomfort;Sore    Pain Type Chronic pain    Pain Onset More than a month ago    Pain Frequency Intermittent    Aggravating Factors  prolong  activity, movement and standing    Pain Relieving Factors at rest, back support                             [facility] Adult PT Treatment/Exercise - 01/08/20 0001      Lumbar Exercises: Aerobic   Nustep L3 x62mn LE only with SPM 50-60   O290% HR101 after     Lumbar Exercises: Supine   Ab Set 3 seconds   2x10 with red swiss ball alt UE/LE push 3 sec holds   Bridge 10 reps;3 seconds    Straight Leg Raise 3 seconds   2x10   Other Supine Lumbar Exercises supine hip abd with green t-band x30    Other Supine Lumbar Exercises grey ball squeeze 3sec x30      Knee/Hip Exercises: Seated   Long Arc Quad Strengthening;Both;3 sets;10 reps    Long Arc Quad Weight 4 lbs.    Marching Strengthening;Both;2 sets;10 reps;Weights    Marching Limitations 4    Hamstring Curl Strengthening;Both;20 reps    Hamstring Limitations green band                    PT Short Term Goals - 01/01/20 08032  PT SHORT TERM GOAL #1   Title Patient will be independent with HEP    Baseline Met 11/27/19    Time 3    Period Weeks    Status Achieved      PT SHORT TERM GOAL #2   Title Patient will perform 5 sit to stands in 30 seconds or less with bilateral UE support to improve functional LE strength.    Baseline 6 sit to stand in 30 seconds Met 01/01/20    Time 3    Period Weeks    Status Achieved             PT Long Term Goals - 01/08/20 0827      PT LONG TERM GOAL #1   Title Patient will be independent with advanced HEP    Baseline no knowledge of HEP    Time 8    Period Weeks    Status On-going      PT LONG TERM GOAL #2   Title Patient will demonstrate 3+/5 right hip flexion MMT to improve ability to perform functional tasks.    Baseline 3-/5 right hip MMT eval    Time 8    Period Weeks    Status On-going      PT LONG TERM GOAL #3   Title Patient will ambulate with rolling walker or LRAD with step through gait pattern and equal step lengths to improve gait mechanics.      Baseline Ambulating with rolling walker with narrow base of support and varying step to and step through pattern, decreased right step length    Time 8    Period Weeks    Status On-going      PT LONG TERM GOAL #4   Title Patient will report ability to perform bed mobility, transfers, and ADLs independently with proper sequencing and technique.    Baseline Patient required less yet some assist this weekend due to back pain 11/15//21    Time 8    Period Weeks    Status On-going      PT LONG TERM GOAL #5   Title Patient will demonstrate 15+ lbs of right grip strength force to improve ability to perform fine motor tasks and functional tasks.    Baseline Met 26# 12/28/19    Time 8    Period Weeks    Status Achieved                 Plan - 01/08/20 0851    Clinical Impression Statement Patient tolerated treatment well today with vitals within normal limits. Patient able to progress with supine exercises today and good transition for supine to sit/sit to supine with no asssit needed. Patient requires less assist at home aslo per reported. Patient is to have stress test tomorrow and focused on core and LE progression and less standing to allow rest for test tomorrow. goals progressing today.    Personal Factors and Comorbidities Comorbidity 3+    Comorbidities HTN, DM, history of COVID-19, CHF, Asthma,  L sided stroke    Examination-Activity Limitations Stand;Dressing;Hygiene/Grooming;Transfers;Locomotion Level;Bed Mobility;Stairs    Examination-Participation Restrictions Occupation    Stability/Clinical Decision Making Evolving/Moderate complexity    Rehab Potential Good    PT Frequency 2x / week    PT Duration 8 weeks    PT Treatment/Interventions ADLs/Self Care Home Management;Neuromuscular re-education;Manual techniques;Passive range of motion;Balance training;Therapeutic exercise;Therapeutic activities;Stair training;Functional mobility training;Gait training;Patient/family  education    PT Next Visit Plan cont with POC and Monitor fatigue  and vitals. gentle LE and UE strengthening, core and ROM exercises, gait training and resistance walking    Consulted and Agree with Plan of Care Patient           Patient will benefit from skilled therapeutic intervention in order to improve the following deficits and impairments:  Abnormal gait, Difficulty walking, Pain, Decreased balance, Decreased activity tolerance, Decreased strength, Postural dysfunction, Decreased endurance  Visit Diagnosis: Difficulty in walking, not elsewhere classified  Muscle weakness (generalized)  Unsteadiness on feet     Problem List Patient Active Problem List   Diagnosis Date Noted  . Cerebritis 12/06/2019  . Cutaneous abscess of left foot   . Diabetic foot ([place])   . Acute systolic heart failure ([place])   . Embolic infarction ([place])   . Hyperglycemia   . Hypoxia   . Lower respiratory tract infection due to COVID-19 virus 10/10/2019  . Benign hypertension 07/23/2015  . Gastro-esophageal reflux disease with esophagitis 07/23/2015  . Gout 07/23/2015  . Obstructive sleep apnea 07/23/2015  . Snoring 07/23/2015  . Chronic idiopathic gout of multiple sites 06/02/2015  . Class 2 obesity in adult 06/02/2015  . Fibromyalgia 06/02/2015  . Irritable bowel syndrome with diarrhea 06/02/2015  . Mild persistent asthma 06/02/2015  . Reaction, adjustment, with depressed mood, prolonged 06/02/2015  . Type 2 diabetes mellitus without complication, without long-term current use of insulin ([place]) 06/02/2015    DUNFORD, CHRISTINA P, PTA 01/08/2020, 9:03 AM  [facility] [place], [place], [address] Phone: [phone]   Fax:  [phone]  Name: Micheal Bell MRN: [id number] Date of Birth: 04-22-1970

## 2020-01-10 ENCOUNTER — Ambulatory Visit (HOSPITAL_COMMUNITY): Payer: Medicaid Other | Attending: Cardiovascular Disease

## 2020-01-10 ENCOUNTER — Other Ambulatory Visit: Payer: Self-pay

## 2020-01-10 DIAGNOSIS — R072 Precordial pain: Secondary | ICD-10-CM | POA: Diagnosis not present

## 2020-01-10 LAB — MYOCARDIAL PERFUSION IMAGING
LV dias vol: 92 mL (ref 62–150)
LV sys vol: 61 mL
Peak HR: 121 {beats}/min
Rest HR: 113 {beats}/min
SDS: 1
SRS: 0
SSS: 1
TID: 1.02

## 2020-01-10 MED ORDER — ADENOSINE (DIAGNOSTIC) 3 MG/ML IV SOLN
0.5600 mg/kg | Freq: Once | INTRAVENOUS | Status: AC
  Administered 2020-01-10: 60 mg via INTRAVENOUS

## 2020-01-10 MED ORDER — TECHNETIUM TC 99M TETROFOSMIN IV KIT
31.9000 | PACK | Freq: Once | INTRAVENOUS | Status: AC | PRN
  Administered 2020-01-10: 31.9 via INTRAVENOUS
  Filled 2020-01-10: qty 32

## 2020-01-10 MED ORDER — TECHNETIUM TC 99M TETROFOSMIN IV KIT
10.0000 | PACK | Freq: Once | INTRAVENOUS | Status: AC | PRN
  Administered 2020-01-10: 10 via INTRAVENOUS
  Filled 2020-01-10: qty 10

## 2020-01-10 MED ORDER — REGADENOSON 0.4 MG/5ML IV SOLN: 0.4000 mg | Freq: Once | INTRAVENOUS | Status: AC

## 2020-01-11 ENCOUNTER — Ambulatory Visit: Payer: Medicaid Other | Admitting: Physical Therapy

## 2020-01-11 ENCOUNTER — Other Ambulatory Visit: Payer: Self-pay

## 2020-01-11 DIAGNOSIS — R2681 Unsteadiness on feet: Secondary | ICD-10-CM | POA: Diagnosis not present

## 2020-01-11 DIAGNOSIS — R262 Difficulty in walking, not elsewhere classified: Secondary | ICD-10-CM

## 2020-01-11 DIAGNOSIS — M6281 Muscle weakness (generalized): Secondary | ICD-10-CM | POA: Diagnosis not present

## 2020-01-11 NOTE — Therapy (Signed)
[name] [place], [place], [address] Phone: [phone]   Fax:  [phone]  Physical Therapy Treatment  Patient Details  Name: Micheal Bell MRN: [id number] Date of Birth: [birth year] Referring Provider (PT): Holli Humbles, MD   Encounter Date: 01/11/2020   PT End of Session - 01/11/20 0846    Visit Number 14    Number of Visits 18    Date for PT Re-Evaluation 01/25/20    PT Start Time 0815    PT Stop Time 0858    PT Time Calculation (min) 43 min           Past Medical History:  Diagnosis Date  . Arthritis   . Asthma   . Diabetes mellitus without complication ([place])   . DVT (deep venous thrombosis) ([place])   . Fibromyalgia   . Gout   . Hypertension   . IBS (irritable bowel syndrome)   . Vein disorder     Past Surgical History:  Procedure Laterality Date  . FOOT SURGERY    . TEE WITHOUT CARDIOVERSION N/A 10/24/2019   Procedure: TRANSESOPHAGEAL ECHOCARDIOGRAM (TEE);  Surgeon: Josue Hector, MD;  Location: [facility] ENDOSCOPY;  Service: Cardiovascular;  Laterality: N/A;    There were no vitals filed for this visit.   Subjective Assessment - 01/11/20 0823    Subjective COVID-19 screening performed upon arrival. Patient arrived feeling well, did his stress test yesterday and awaiting results    Pertinent History HTN, DM, history of COVID-19, CHF, Asthma    Limitations House hold activities;Standing;Walking    How long can you walk comfortably? short periods around home    Patient Stated Goals improve mobility    Currently in Pain? Yes    Pain Score --   no number given today   Pain Location Back    Pain Orientation Lower    Pain Descriptors / Indicators Discomfort    Pain Type Chronic pain    Pain Onset More than a month ago    Pain Frequency Constant    Aggravating Factors  certain movements and prolong activity    Pain Relieving Factors at rest                             [facility]  Adult PT Treatment/Exercise - 01/11/20 0001      Lumbar Exercises: Aerobic   Nustep L3 x23mn LE only with SPM 80 SPM   O2 92% HR 120HR after     Knee/Hip Exercises: Standing   Heel Raises Both;2 sets;10 reps      Knee/Hip Exercises: Seated   Long Arc Quad Strengthening;Both;3 sets;10 reps    Long Arc Quad Weight 4 lbs.    Other Seated Knee/Hip Exercises hip abd with green band x30    Marching Strengthening;Both;2 sets;10 reps;Weights    Marching Limitations 4    Hamstring Curl Strengthening;Both;20 reps    Hamstring Limitations green band    Sit to Sand 10 reps;with UE support;without UE support                    PT Short Term Goals - 01/01/20 0839      PT SHORT TERM GOAL #1   Title Patient will be independent with HEP    Baseline Met 11/27/19    Time 3    Period Weeks    Status Achieved      PT SHORT TERM GOAL #2  Title Patient will perform 5 sit to stands in 30 seconds or less with bilateral UE support to improve functional LE strength.    Baseline 6 sit to stand in 30 seconds Met 01/01/20    Time 3    Period Weeks    Status Achieved             PT Long Term Goals - 01/08/20 0827      PT LONG TERM GOAL #1   Title Patient will be independent with advanced HEP    Baseline no knowledge of HEP    Time 8    Period Weeks    Status On-going      PT LONG TERM GOAL #2   Title Patient will demonstrate 3+/5 right hip flexion MMT to improve ability to perform functional tasks.    Baseline 3-/5 right hip MMT eval    Time 8    Period Weeks    Status On-going      PT LONG TERM GOAL #3   Title Patient will ambulate with rolling walker or LRAD with step through gait pattern and equal step lengths to improve gait mechanics.    Baseline Ambulating with rolling walker with narrow base of support and varying step to and step through pattern, decreased right step length    Time 8    Period Weeks    Status On-going      PT LONG TERM GOAL #4   Title Patient will  report ability to perform bed mobility, transfers, and ADLs independently with proper sequencing and technique.    Baseline Patient required less yet some assist this weekend due to back pain 11/15//21    Time 8    Period Weeks    Status On-going      PT LONG TERM GOAL #5   Title Patient will demonstrate 15+ lbs of right grip strength force to improve ability to perform fine motor tasks and functional tasks.    Baseline Met 26# 12/28/19    Time 8    Period Weeks    Status Achieved                 Plan - 01/11/20 0856    Clinical Impression Statement Patient tolerated treatment well today. Today focused on seated exercises due to stress test yesterday and a lot of walking to get to his appt. Vitals WNL throughout treatment. Patient able to perfrom all exercises today with some fatigue. Patient doing well with ADL's and required some ongoing help with getting in and out of bed at times. Goals progressing this week.    Personal Factors and Comorbidities Comorbidity 3+    Comorbidities HTN, DM, history of COVID-19, CHF, Asthma,  L sided stroke    Examination-Activity Limitations Stand;Dressing;Hygiene/Grooming;Transfers;Locomotion Level;Bed Mobility;Stairs    Examination-Participation Restrictions Occupation    Stability/Clinical Decision Making Evolving/Moderate complexity    Rehab Potential Good    PT Frequency 2x / week    PT Duration 8 weeks    PT Treatment/Interventions ADLs/Self Care Home Management;Neuromuscular re-education;Manual techniques;Passive range of motion;Balance training;Therapeutic exercise;Therapeutic activities;Stair training;Functional mobility training;Gait training;Patient/family education    PT Next Visit Plan cont with POC and Monitor fatigue and vitals. gentle LE and UE strengthening, core and ROM exercises, gait training and resistance walking    Consulted and Agree with Plan of Care Patient           Patient will benefit from skilled therapeutic  intervention in order to improve the following deficits and  impairments:  Abnormal gait, Difficulty walking, Pain, Decreased balance, Decreased activity tolerance, Decreased strength, Postural dysfunction, Decreased endurance  Visit Diagnosis: Difficulty in walking, not elsewhere classified  Muscle weakness (generalized)  Unsteadiness on feet     Problem List Patient Active Problem List   Diagnosis Date Noted  . Cerebritis 12/06/2019  . Cutaneous abscess of left foot   . Diabetic foot ([place])   . Acute systolic heart failure ([place])   . Embolic infarction ([place])   . Hyperglycemia   . Hypoxia   . Lower respiratory tract infection due to COVID-19 virus 10/10/2019  . Benign hypertension 07/23/2015  . Gastro-esophageal reflux disease with esophagitis 07/23/2015  . Gout 07/23/2015  . Obstructive sleep apnea 07/23/2015  . Snoring 07/23/2015  . Chronic idiopathic gout of multiple sites 06/02/2015  . Class 2 obesity in adult 06/02/2015  . Fibromyalgia 06/02/2015  . Irritable bowel syndrome with diarrhea 06/02/2015  . Mild persistent asthma 06/02/2015  . Reaction, adjustment, with depressed mood, prolonged 06/02/2015  . Type 2 diabetes mellitus without complication, without long-term current use of insulin ([place]) 06/02/2015    Cheyanne Lamison P, PTA 01/11/2020, 9:05 AM  [facility] [address] [place], [place], [address] Phone: [phone]   Fax:  [phone]  Name: REMMY RIFFE MRN: [id number] Date of Birth: 1970-03-05

## 2020-01-15 ENCOUNTER — Other Ambulatory Visit: Payer: Self-pay

## 2020-01-15 ENCOUNTER — Ambulatory Visit: Payer: Medicaid Other | Admitting: Physical Therapy

## 2020-01-15 DIAGNOSIS — M6281 Muscle weakness (generalized): Secondary | ICD-10-CM

## 2020-01-15 DIAGNOSIS — R262 Difficulty in walking, not elsewhere classified: Secondary | ICD-10-CM

## 2020-01-15 DIAGNOSIS — R2681 Unsteadiness on feet: Secondary | ICD-10-CM | POA: Diagnosis not present

## 2020-01-15 NOTE — Therapy (Signed)
[name] [place], [place], [address] Phone: [phone]   Fax:  [phone]  Physical Therapy Treatment  Patient Details  Name: Micheal Bell MRN: [id number] Date of Birth: [birth year] Referring Provider (PT): Holli Humbles, MD   Encounter Date: 01/15/2020   PT End of Session - 01/15/20 0821    Visit Number 15    Number of Visits 18    Date for PT Re-Evaluation 01/25/20    PT Start Time 0815    PT Stop Time 6010    PT Time Calculation (min) 42 min    Activity Tolerance Patient tolerated treatment well    Behavior During Therapy [facility] for tasks assessed/performed           Past Medical History:  Diagnosis Date  . Arthritis   . Asthma   . Diabetes mellitus without complication ([place])   . DVT (deep venous thrombosis) ([place])   . Fibromyalgia   . Gout   . Hypertension   . IBS (irritable bowel syndrome)   . Vein disorder     Past Surgical History:  Procedure Laterality Date  . FOOT SURGERY    . TEE WITHOUT CARDIOVERSION N/A 10/24/2019   Procedure: TRANSESOPHAGEAL ECHOCARDIOGRAM (TEE);  Surgeon: Josue Hector, MD;  Location: [facility]COPY;  Service: Cardiovascular;  Laterality: N/A;    There were no vitals filed for this visit.   Subjective Assessment - 01/15/20 0819    Subjective COVID-19 screening performed upon arrival. Patient reported doing well over the weekend. Patient got results from stress test and no details yet needs F/U with MD due to abnormalties not sure of specifics    Pertinent History HTN, DM, history of COVID-19, CHF, Asthma    Limitations House hold activities;Standing;Walking    How long can you walk comfortably? short periods around home    Patient Stated Goals improve mobility    Currently in Pain? Yes    Pain Score --   no number given   Pain Location Back    Pain Orientation Lower    Pain Descriptors / Indicators Discomfort    Pain Type Chronic pain    Pain Onset More than a  month ago    Pain Frequency Constant    Aggravating Factors  certain movements    Pain Relieving Factors rest              [facility] PT Assessment - 01/15/20 0001      Strength   Strength Assessment Site Hip    Right/Left hand Right    Right Hip Flexion 3/5                         [facility] Adult PT Treatment/Exercise - 01/15/20 0001      Lumbar Exercises: Aerobic   Nustep L3 x86mn LE only with SPM 80 SPM   50-60 SPM today @ O2 90 HR 119      Lumbar Exercises: Seated   Other Seated Lumbar Exercises seated core sets with red swiss ball x20    Other Seated Lumbar Exercises seated trunk rotations with grey ball x20      Knee/Hip Exercises: Standing   Heel Raises Both;2 sets;10 reps    Hip Flexion Stengthening;Both;2 sets;10 reps;Knee bent      Knee/Hip Exercises: Seated   Long Arc Quad Strengthening;Both;3 sets;10 reps    Long Arc Quad Weight 4 lbs.    Other Seated Knee/Hip Exercises hip abd with green  band x30    Other Seated Knee/Hip Exercises grey ball add squeeze 5sec hold x 2 min    Marching Strengthening;Both;2 sets;10 reps;Weights    Marching Limitations 4    Hamstring Curl Strengthening;Both;20 reps    Hamstring Limitations green band                    PT Short Term Goals - 01/01/20 0839      PT SHORT TERM GOAL #1   Title Patient will be independent with HEP    Baseline Met 11/27/19    Time 3    Period Weeks    Status Achieved      PT SHORT TERM GOAL #2   Title Patient will perform 5 sit to stands in 30 seconds or less with bilateral UE support to improve functional LE strength.    Baseline 6 sit to stand in 30 seconds Met 01/01/20    Time 3    Period Weeks    Status Achieved             PT Long Term Goals - 01/15/20 2202      PT LONG TERM GOAL #1   Title Patient will be independent with advanced HEP    Time 8    Period Weeks    Status On-going      PT LONG TERM GOAL #2   Title Patient will demonstrate 3+/5 right hip  flexion MMT to improve ability to perform functional tasks.    Baseline 3/5 right hip MMT 01/15/20    Time 8    Period Weeks    Status On-going      PT LONG TERM GOAL #3   Title Patient will ambulate with rolling walker or LRAD with step through gait pattern and equal step lengths to improve gait mechanics.    Baseline Ambulating with rolling walker with narrow base of support and varying step to and step through pattern, decreased right step length    Time 8    Period Weeks    Status On-going      PT LONG TERM GOAL #4   Title Patient will report ability to perform bed mobility, transfers, and ADLs independently with proper sequencing and technique.    Baseline Patient required less yet some assist this weekend due to back pain 11/22//21    Time 8    Period Weeks    Status On-going      PT LONG TERM GOAL #5   Title Patient will demonstrate 15+ lbs of right grip strength force to improve ability to perform fine motor tasks and functional tasks.    Baseline Met 26# 12/28/19    Time 8    Period Weeks    Status Achieved                 Plan - 01/15/20 0853    Clinical Impression Statement Patient tolerated treatment well today. Today focused on more seated activity for LE and core to avoid and prolong standing activity until his F/U with MD. Patient continues to have back pain with prolong activity or certain movements. Patient has improved overall strength and endurance and feels he can perfrom light ADL's with greater ease. Goals progresing today.    Personal Factors and Comorbidities Comorbidity 3+    Comorbidities HTN, DM, history of COVID-19, CHF, Asthma,  L sided stroke    Examination-Activity Limitations Stand;Dressing;Hygiene/Grooming;Transfers;Locomotion Level;Bed Mobility;Stairs    Examination-Participation Restrictions Occupation    [employer]  Evolving/Moderate complexity    Rehab Potential Good    PT Frequency 2x / week    PT Duration 8  weeks    PT Treatment/Interventions ADLs/Self Care Home Management;Neuromuscular re-education;Manual techniques;Passive range of motion;Balance training;Therapeutic exercise;Therapeutic activities;Stair training;Functional mobility training;Gait training;Patient/family education    PT Next Visit Plan cont with POC and Monitor fatigue and vitals. gentle LE and UE strengthening, core and ROM exercises, gait training and resistance walking           Patient will benefit from skilled therapeutic intervention in order to improve the following deficits and impairments:  Abnormal gait, Difficulty walking, Pain, Decreased balance, Decreased activity tolerance, Decreased strength, Postural dysfunction, Decreased endurance  Visit Diagnosis: Difficulty in walking, not elsewhere classified  Muscle weakness (generalized)  Unsteadiness on feet     Problem List Patient Active Problem List   Diagnosis Date Noted  . Cerebritis 12/06/2019  . Cutaneous abscess of left foot   . Diabetic foot ([place])   . Acute systolic heart failure ([place])   . Embolic infarction ([place])   . Hyperglycemia   . Hypoxia   . Lower respiratory tract infection due to COVID-19 virus 10/10/2019  . Benign hypertension 07/23/2015  . Gastro-esophageal reflux disease with esophagitis 07/23/2015  . Gout 07/23/2015  . Obstructive sleep apnea 07/23/2015  . Snoring 07/23/2015  . Chronic idiopathic gout of multiple sites 06/02/2015  . Class 2 obesity in adult 06/02/2015  . Fibromyalgia 06/02/2015  . Irritable bowel syndrome with diarrhea 06/02/2015  . Mild persistent asthma 06/02/2015  . Reaction, adjustment, with depressed mood, prolonged 06/02/2015  . Type 2 diabetes mellitus without complication, without long-term current use of insulin ([place]) 06/02/2015    Makira Holleman P, PTA 01/15/2020, 8:58 AM  [facility] [place], [place], [address] Phone: [phone]   Fax:   [phone]  Name: Micheal Bell MRN: [id number] Date of Birth: 10-May-1970

## 2020-01-21 NOTE — H&P (View-Only) (Signed)
Cardiology Office Note:   Date:  01/23/2020  NAME:  Micheal Bell    MRN: [id number] DOB:  [birth year]   PCP:  [name], MD  Cardiologist:  No primary care provider on file.   Referring MD: [name], MD   Chief Complaint  Patient presents with  . Coronary Artery Disease   History of Present Illness:   Micheal Bell is a 49 y.o. male with a hx of uncontrolled DM, systolic HF, CAD, HLD who presents for follow-up. Recently hospitalization in September with sepsis and PNA. Had reduced EF and bacteremia. Stress test with concerns for inferior wall infarction. EF back down. He reports he still having exertional chest pain or shortness of breath.  He is working with physical therapy and recovering from his recent infection.  He has completed antibiotics for his bacteremia.  He is still on Eliquis for his lower extremity DVT.  He will complete 3 months on December 17.  He does report he is taking some of his mother's nitroglycerin with improvement in his pain.  He is taking his insulin.  He has not met with his primary care physician for repeat lab work.  Hopefully this will improve.  EF was also reduced on his stress test.  He is taking Lasix 20 mg daily.  Weights are continue to climb down.  No increased volume from what I can tell.  We did discuss that he needs a left heart catheterization.  He is okay to proceed with this.  Also needs to get started on Coreg and losartan.   Problem List 1. DM -A1c 11.3 2. HTN 3. CHF -EF 30-35% in setting of sepsis  -EF improved to 55% at discharge (stress related) -insetting of MSSA sepsis/foot abscess/covid 19 PNA/cerebritis  4. CVA -prior L frontal lobe 5. HLD -T chol 168, HDL 43, LDL 99, TG 130 6. DVT -R popliteal vein eliquis started 11/11/2019  Past Medical History: Past Medical History:  Diagnosis Date  . Arthritis   . Asthma   . Diabetes mellitus without complication ([place])   . DVT (deep venous thrombosis) ([place])   . Fibromyalgia    . Gout   . Hypertension   . IBS (irritable bowel syndrome)   . Vein disorder     Past Surgical History: Past Surgical History:  Procedure Laterality Date  . FOOT SURGERY    . TEE WITHOUT CARDIOVERSION N/A 10/24/2019   Procedure: TRANSESOPHAGEAL ECHOCARDIOGRAM (TEE);  Surgeon: Josue Hector, MD;  Location: [facility] ENDOSCOPY;  Service: Cardiovascular;  Laterality: N/A;    Current Medications: Current Meds  Medication Sig  . albuterol (PROVENTIL HFA;VENTOLIN HFA) 108 (90 Base) MCG/ACT inhaler Inhale into the lungs every 6 (six) hours as needed for wheezing or shortness of breath.  . allopurinol (ZYLOPRIM) 300 MG tablet Take 300 mg by mouth daily.  Marland Kitchen apixaban (ELIQUIS) 5 MG TABS tablet Take 1 tablet (5 mg total) by mouth 2 (two) times daily.  Marland Kitchen atorvastatin (LIPITOR) 40 MG tablet Take 1 tablet (40 mg total) by mouth daily.  Marland Kitchen buPROPion (WELLBUTRIN XL) 150 MG 24 hr tablet Take 150 mg by mouth every morning.  . cyclobenzaprine (FLEXERIL) 10 MG tablet Take 0.5 tablets (5 mg total) by mouth 2 (two) times daily as needed for muscle spasms.  . Fluticasone Propionate, Inhal, (FLOVENT IN) Inhale into the lungs.  . furosemide (LASIX) 20 MG tablet Take 20 mg by mouth.  . gabapentin (NEURONTIN) 300 MG capsule Take 300 mg by mouth  3 (three) times daily.  Marland Kitchen HYDROcodone-acetaminophen (NORCO/VICODIN) 5-325 MG tablet Take 1 tablet by mouth every 6 (six) hours as needed for moderate pain.  Marland Kitchen insulin glargine (LANTUS) 100 UNIT/ML Solostar Pen Inject 20 Units into the skin daily.  Marland Kitchen levETIRAcetam (KEPPRA) 500 MG tablet Take 1 tablet (500 mg total) by mouth 2 (two) times daily.  Marland Kitchen lidocaine (LIDODERM) 5 % Place 1 patch onto the skin daily. Remove & Discard patch within 12 hours or as directed by MD  . mometasone-formoterol (DULERA) 100-5 MCG/ACT AERO Inhale 2 puffs into the lungs 2 (two) times daily.  . montelukast (SINGULAIR) 10 MG tablet Take 10 mg by mouth at bedtime.  . nafcillin IVPB Inject 12 g into the  vein daily. Indication:  MSSA and GBS bacteremia First Dose: No Last Day of Therapy:  11/22/19 Labs - Once weekly:  CBC/D and BMP, Labs - Every other week:  ESR and CRP Method of administration: Elastomeric (Continuous infusion) Method of administration may be changed at the discretion of home infusion pharmacist based upon assessment of the patient and/or caregiver's ability to self-administer the medication ordered.  . nortriptyline (PAMELOR) 25 MG capsule Take 25 mg by mouth at bedtime.  . pantoprazole (PROTONIX) 40 MG tablet Take 40 mg by mouth daily.  Marland Kitchen PENTIPS 32G X 4 MM MISC See admin instructions. with insulin  . zolpidem (AMBIEN) 10 MG tablet Take 10 mg by mouth at bedtime as needed for sleep.     Allergies:    Azithromycin and Lodine [etodolac]   Social History: Social History   Socioeconomic History  . Marital status: Single    Spouse name: Not on file  . Number of children: Not on file  . Years of education: Not on file  . Highest education level: Not on file  Occupational History  . Occupation: disabled  Tobacco Use  . Smoking status: Never Smoker  . Smokeless tobacco: Never Used  Vaping Use  . Vaping Use: Never used  Substance and Sexual Activity  . Alcohol use: Never  . Drug use: Never  . Sexual activity: Not on file  Other Topics Concern  . Not on file  Social History Narrative  . Not on file   Social Determinants of Health   Financial Resource Strain:   . Difficulty of Paying Living Expenses: Not on file  Food Insecurity:   . Worried About Charity fundraiser in the Last Year: Not on file  . Ran Out of Food in the Last Year: Not on file  Transportation Needs:   . Lack of Transportation (Medical): Not on file  . Lack of Transportation (Non-Medical): Not on file  Physical Activity:   . Days of Exercise per Week: Not on file  . Minutes of Exercise per Session: Not on file  Stress:   . Feeling of Stress : Not on file  Social Connections:   .  Frequency of Communication with Friends and Family: Not on file  . Frequency of Social Gatherings with Friends and Family: Not on file  . Attends Religious Services: Not on file  . Active Member of Clubs or Organizations: Not on file  . Attends Archivist Meetings: Not on file  . Marital Status: Not on file     Family History: The patient's family history includes Diabetes in his mother; Heart disease in his father and mother.  ROS:   All other ROS reviewed and negative. Pertinent positives noted in the HPI.  EKGs/Labs/Other Studies Reviewed:   The following studies were personally reviewed by me today:  NM Stress 01/10/2020  Nuclear stress EF: 34%.  There was no ST segment deviation noted during stress.  No T wave inversion was noted during stress.  The left ventricular ejection fraction is moderately decreased (30-44%).  Findings consistent with prior myocardial infarction.  This is a high risk study.   1. There is a moderate fixed defect that is medium in size (15-20% of the LV) in the inferior wall base to apex with associated hypokinesis concerning for infarction. 2. No ischemia is present. 3. Moderately reduced LVEF, 34%.  4. Overall, this is a high risk study.   TTE 10/12/2019 1. Extremely poor acoustic windows. LVEF appears to be depressed with  diffuse hypokinesis, worse in the mid/distal inferior/inferoseptal walls.  . Left ventricular ejection fraction, by estimation, is 30 to 35%. The  left ventricle has moderately  decreased function. The left ventricular internal cavity size was mildly  dilated. Left ventricular diastolic parameters are indeterminate.  2. Right ventricular systolic function is moderately reduced. The right  ventricular size is mildly enlarged.  3. The mitral valve is normal in structure. Trivial mitral valve  regurgitation.  4. The aortic valve is normal in structure. Aortic valve regurgitation is  not visualized.    Recent Labs: 10/26/2019: TSH 4.137 11/08/2019: ALT 18; Hemoglobin 9.3; Platelets 297 11/09/2019: B Natriuretic Peptide 28.8 11/14/2019: BUN 6; Creatinine, Ser 0.70; Magnesium 1.8; Potassium 3.6; Sodium 137   Recent Lipid Panel    Component Value Date/Time   CHOL 168 10/26/2019 0402   TRIG 130 10/26/2019 0402   HDL 43 10/26/2019 0402   CHOLHDL 3.9 10/26/2019 0402   VLDL 26 10/26/2019 0402   LDLCALC 99 10/26/2019 0402    Physical Exam:   VS:  BP 124/90   Pulse (!) 119   Ht 6' 2"  (1.88 m)   Wt 230 lb (104.3 kg)   SpO2 98%   BMI 29.53 kg/m    Wt Readings from Last 3 Encounters:  01/23/20 230 lb (104.3 kg)  01/10/20 238 lb (108 kg)  12/22/19 238 lb 3.2 oz (108 kg)    General: Well nourished, well developed, in no acute distress Heart: Atraumatic, normal size  Eyes: PEERLA, EOMI  Neck: Supple, no JVD Endocrine: No thryomegaly Cardiac: Normal S1, S2; RRR; no murmurs, rubs, or gallops Lungs: Clear to auscultation bilaterally, no wheezing, rhonchi or rales  Abd: Soft, nontender, no hepatomegaly  Ext: No edema, pulses 2+ Musculoskeletal: No deformities, BUE and BLE strength normal and equal Skin: Warm and dry, no rashes   Neuro: Alert and oriented to person, place, time, and situation, CNII-XII grossly intact, no focal deficits  Psych: Normal mood and affect   ASSESSMENT:   Micheal Bell is a 49 y.o. male who presents for the following: 1. Chronic systolic heart failure ([place])   2. SOB (shortness of breath)   3. Other chest pain   4. Abnormal stress test   5. Coronary artery disease involving native coronary artery of native heart with unstable angina pectoris ([place])   6. Mixed hyperlipidemia   7. Acute deep vein thrombosis (DVT) of popliteal vein of right lower extremity (HCC)     PLAN:   1. Chronic systolic heart failure ([place]) 2. SOB (shortness of breath) -EF was initially in the hospital 30 to 35%.  This had improved but his stress test versus EF is 34%.  Findings are  concerning for ischemic cardiomyopathy.  He is euvolemic.  He will continue Lasix 20 mg daily.  We will start him on Coreg 3.125 mg twice daily as well as losartan 12.5 mg daily.  We will work to continue to optimize medical therapy but do need a left heart catheterization.  See discussion below.  He will likely benefit from [facility] but I would like to get his heart catheterization done first and then we can work to get a month these medications.  He will also need a repeat echocardiogram given the inconsistency in his transthoracic study and transesophageal study and now his nuclear medicine stress test.  3. Other chest pain 4. Abnormal stress test 5. Coronary artery disease involving native coronary artery of native heart with unstable angina pectoris [facility]) -Nuclear medicine stress test with medium size fixed defect in the inferior wall.  Associated hypokinesis was noted.  EF 34%.  He reports continued exertional chest pain or pressure.  Symptoms are concerning for unstable angina.  I have recommended left heart catheterization.  I suspect he may end up having triple-vessel disease given his uncontrolled diabetes.  He is on Eliquis for a lower extremity DVT.  He will complete 3 months of December 17.  We will plan for left heart catheterization on December 20.  He will not continue on anticoagulation after this date.  He has completed 3 months at that time I think this will be okay.  His DVT was found in the setting of sepsis and was clearly provoked. -We will start him on aspirin 81 mg daily.  He will complete his Eliquis on December 17.  He is on Lipitor 40 mg daily.   -He was given a prescription for sublingual nitroglycerin.  No symptoms at rest.  Should he take more than 3 doses, 3 to 5 minutes per dose, with persistent chest pain he will proceed to the emergency room.  He was in agreement with this plan.  We will try to do this as an outpatient.  6. Mixed hyperlipidemia -Continue Lipitor 40 mg  daily.  Uncontrolled due to diabetes.  Hopefully LDL improved with diabetes control and Lipitor.  7. Acute deep vein thrombosis (DVT) of popliteal vein of right lower extremity (HCC) -R popliteal vein eliquis started 11/11/2019.  This was in the setting of sepsis and bacteremia.  He is complete antibiotics.  We will proceed with left heart catheterization on 02/12/2020.  He will complete 3 months of Eliquis on 12/10/2019.  He will then stop and not restart after his heart catheterization.  I think this is the safest way to perform his left heart catheterization.   Disposition: Return in about 6 weeks (around 03/05/2020).  Medication Adjustments/Labs and Tests Ordered: Current medicines are reviewed at length with the patient today.  Concerns regarding medicines are outlined above.  Orders Placed This Encounter  Procedures  . Basic metabolic panel  . CBC   Meds ordered this encounter  Medications  . nitroGLYCERIN (NITROSTAT) 0.4 MG SL tablet    Sig: Place 1 tablet (0.4 mg total) under the tongue every 5 (five) minutes as needed for chest pain.    Dispense:  25 tablet    Refill:  11  . carvedilol (COREG) 3.125 MG tablet    Sig: Take 1 tablet (3.125 mg total) by mouth 2 (two) times daily.    Dispense:  180 tablet    Refill:  3  . losartan (COZAAR) 25 MG tablet    Sig: Take 1/2 tablet ( 12.5 mg ) daily  Dispense:  45 tablet    Refill:  3    Patient Instructions  Medication Instructions:  Start taking Aspirin 81 mg daily Start taking Coreg 3/125 mg twice a day Start taking Losartan 25 mg 1/2 tablet daily Start taking NTG under tongue as needed for chest pain Stop Eliquis on 02/09/20 Continue all other medications  *If you need a refill on your cardiac medications before your next appointment, please call your pharmacy*   Lab Work: Bmet,cbc today   Testing/Procedures: Cardiac Cath scheduled Monday 02/12/20  Follow instructions below   Follow-Up: At [facility], you  and your health needs are our priority.  As part of our continuing mission to provide you with exceptional heart care, we have created designated Provider Care Teams.  These Care Teams include your primary Cardiologist (physician) and Advanced Practice Providers (APPs -  Physician Assistants and Nurse Practitioners) who all work together to provide you with the care you need, when you need it.  We recommend signing up for the patient portal called "MyChart".  Sign up information is provided on this After Visit Summary.  MyChart is used to connect with patients for Virtual Visits (Telemedicine).  Patients are able to view lab/test results, encounter notes, upcoming appointments, etc.  Non-urgent messages can be sent to your provider as well.   To learn more about what you can do with MyChart, go to [email or web address].    Your next appointment: Monday 03/25/20 at 11:20 am    The format for your next appointment: Office    Provider:  Putnam Towner [place] [address] Dept: [phone] Loc: [place]  01/23/2020  You are scheduled for a Cardiac Cath on Monday 02/12/20,  with Dr.Cooper.  1. Please arrive at the [facility] (Main Entrance A) at [facility]: [address] [place], [place] [address] at 5:30 am (This time is two hours before your procedure to ensure your preparation). Free valet parking service is available.   Special note: Every effort is made to have your procedure done on time. Please understand that emergencies sometimes delay scheduled procedures.  2. Diet: Do not eat solid foods after midnight.  The patient may have clear liquids until 5am upon the day of the procedure.  3. Labs: You will need to have blood drawn on Tuesday 01/23/20 You do not need to be fasting.   Covid Test scheduled Friday 02/09/20 at 1:25 pm at  [place] [employer] until after [employer].  4. Medication instructions in preparation for your procedure:   Hold Insulin morning of Cath  Hold Furosemide morning of Cath    On the morning of your procedure, take your Aspirin 81 mg and any morning medicines NOT listed above.  You may use sips of water.  5. Plan for one night stay--bring personal belongings. 6. Bring a current list of your medications and current insurance cards. 7. You MUST have a responsible person to drive you home. 8. Someone MUST be with you the first 24 hours after you arrive home or your discharge will be delayed. 9. Please wear clothes that are easy to get on and off and wear slip-on shoes.  Thank you for allowing [place] to care for you!   -- [facility]     Time Spent with Patient: I have spent a total  of 35 minutes with patient reviewing hospital notes, telemetry, EKGs, labs and examining the patient as well as establishing an assessment and plan that was discussed with the patient.  > 50% of time was spent in direct patient care.  Signed, Addison Naegeli. Audie Box, [place]  [address] [place], [place] [address] [phone]  01/23/2020 3:43 PM

## 2020-01-21 NOTE — Progress Notes (Signed)
Cardiology Office Note:   Date:  01/23/2020  NAME:  Micheal Bell    MRN: [id number] DOB:  December 01, 1970   PCP:  [name], MD  Cardiologist:  No primary care provider on file.   Referring MD: [name], MD   Chief Complaint  Patient presents with  . Coronary Artery Disease   History of Present Illness:   Micheal Bell is a 49 y.o. male with a hx of uncontrolled DM, systolic HF, CAD, HLD who presents for follow-up. Recently hospitalization in September with sepsis and PNA. Had reduced EF and bacteremia. Stress test with concerns for inferior wall infarction. EF back down. He reports he still having exertional chest pain or shortness of breath.  He is working with physical therapy and recovering from his recent infection.  He has completed antibiotics for his bacteremia.  He is still on Eliquis for his lower extremity DVT.  He will complete 3 months on December 17.  He does report he is taking some of his mother's nitroglycerin with improvement in his pain.  He is taking his insulin.  He has not met with his primary care physician for repeat lab work.  Hopefully this will improve.  EF was also reduced on his stress test.  He is taking Lasix 20 mg daily.  Weights are continue to climb down.  No increased volume from what I can tell.  We did discuss that he needs a left heart catheterization.  He is okay to proceed with this.  Also needs to get started on Coreg and losartan.   Problem List 1. DM -A1c 11.3 2. HTN 3. CHF -EF 30-35% in setting of sepsis  -EF improved to 55% at discharge (stress related) -insetting of MSSA sepsis/foot abscess/covid 19 PNA/cerebritis  4. CVA -prior L frontal lobe 5. HLD -T chol 168, HDL 43, LDL 99, TG 130 6. DVT -R popliteal vein eliquis started 11/11/2019  Past Medical History: Past Medical History:  Diagnosis Date  . Arthritis   . Asthma   . Diabetes mellitus without complication ([place])   . DVT (deep venous thrombosis) ([place])   . Fibromyalgia    . Gout   . Hypertension   . IBS (irritable bowel syndrome)   . Vein disorder     Past Surgical History: Past Surgical History:  Procedure Laterality Date  . FOOT SURGERY    . TEE WITHOUT CARDIOVERSION N/A 10/24/2019   Procedure: TRANSESOPHAGEAL ECHOCARDIOGRAM (TEE);  Surgeon: Josue Hector, MD;  Location: [facility] ENDOSCOPY;  Service: Cardiovascular;  Laterality: N/A;    Current Medications: Current Meds  Medication Sig  . albuterol (PROVENTIL HFA;VENTOLIN HFA) 108 (90 Base) MCG/ACT inhaler Inhale into the lungs every 6 (six) hours as needed for wheezing or shortness of breath.  . allopurinol (ZYLOPRIM) 300 MG tablet Take 300 mg by mouth daily.  Marland Kitchen apixaban (ELIQUIS) 5 MG TABS tablet Take 1 tablet (5 mg total) by mouth 2 (two) times daily.  Marland Kitchen atorvastatin (LIPITOR) 40 MG tablet Take 1 tablet (40 mg total) by mouth daily.  Marland Kitchen buPROPion (WELLBUTRIN XL) 150 MG 24 hr tablet Take 150 mg by mouth every morning.  . cyclobenzaprine (FLEXERIL) 10 MG tablet Take 0.5 tablets (5 mg total) by mouth 2 (two) times daily as needed for muscle spasms.  . Fluticasone Propionate, Inhal, (FLOVENT IN) Inhale into the lungs.  . furosemide (LASIX) 20 MG tablet Take 20 mg by mouth.  . gabapentin (NEURONTIN) 300 MG capsule Take 300 mg by mouth  3 (three) times daily.  Marland Kitchen HYDROcodone-acetaminophen (NORCO/VICODIN) 5-325 MG tablet Take 1 tablet by mouth every 6 (six) hours as needed for moderate pain.  Marland Kitchen insulin glargine (LANTUS) 100 UNIT/ML Solostar Pen Inject 20 Units into the skin daily.  Marland Kitchen levETIRAcetam (KEPPRA) 500 MG tablet Take 1 tablet (500 mg total) by mouth 2 (two) times daily.  Marland Kitchen lidocaine (LIDODERM) 5 % Place 1 patch onto the skin daily. Remove & Discard patch within 12 hours or as directed by MD  . mometasone-formoterol (DULERA) 100-5 MCG/ACT AERO Inhale 2 puffs into the lungs 2 (two) times daily.  . montelukast (SINGULAIR) 10 MG tablet Take 10 mg by mouth at bedtime.  . nafcillin IVPB Inject 12 g into the  vein daily. Indication:  MSSA and GBS bacteremia First Dose: No Last Day of Therapy:  11/22/19 Labs - Once weekly:  CBC/D and BMP, Labs - Every other week:  ESR and CRP Method of administration: Elastomeric (Continuous infusion) Method of administration may be changed at the discretion of home infusion pharmacist based upon assessment of the patient and/or caregiver's ability to self-administer the medication ordered.  . nortriptyline (PAMELOR) 25 MG capsule Take 25 mg by mouth at bedtime.  . pantoprazole (PROTONIX) 40 MG tablet Take 40 mg by mouth daily.  Marland Kitchen PENTIPS 32G X 4 MM MISC See admin instructions. with insulin  . zolpidem (AMBIEN) 10 MG tablet Take 10 mg by mouth at bedtime as needed for sleep.     Allergies:    Azithromycin and Lodine [etodolac]   Social History: Social History   Socioeconomic History  . Marital status: Single    Spouse name: Not on file  . Number of children: Not on file  . Years of education: Not on file  . Highest education level: Not on file  Occupational History  . Occupation: disabled  Tobacco Use  . Smoking status: Never Smoker  . Smokeless tobacco: Never Used  Vaping Use  . Vaping Use: Never used  Substance and Sexual Activity  . Alcohol use: Never  . Drug use: Never  . Sexual activity: Not on file  Other Topics Concern  . Not on file  Social History Narrative  . Not on file   Social Determinants of Health   Financial Resource Strain:   . Difficulty of Paying Living Expenses: Not on file  Food Insecurity:   . Worried About Charity fundraiser in the Last Year: Not on file  . Ran Out of Food in the Last Year: Not on file  Transportation Needs:   . Lack of Transportation (Medical): Not on file  . Lack of Transportation (Non-Medical): Not on file  Physical Activity:   . Days of Exercise per Week: Not on file  . Minutes of Exercise per Session: Not on file  Stress:   . Feeling of Stress : Not on file  Social Connections:   .  Frequency of Communication with Friends and Family: Not on file  . Frequency of Social Gatherings with Friends and Family: Not on file  . Attends Religious Services: Not on file  . Active Member of Clubs or Organizations: Not on file  . Attends Archivist Meetings: Not on file  . Marital Status: Not on file     Family History: The patient's family history includes Diabetes in his mother; Heart disease in his father and mother.  ROS:   All other ROS reviewed and negative. Pertinent positives noted in the HPI.  EKGs/Labs/Other Studies Reviewed:   The following studies were personally reviewed by me today:  NM Stress 01/10/2020  Nuclear stress EF: 34%.  There was no ST segment deviation noted during stress.  No T wave inversion was noted during stress.  The left ventricular ejection fraction is moderately decreased (30-44%).  Findings consistent with prior myocardial infarction.  This is a high risk study.   1. There is a moderate fixed defect that is medium in size (15-20% of the LV) in the inferior wall base to apex with associated hypokinesis concerning for infarction. 2. No ischemia is present. 3. Moderately reduced LVEF, 34%.  4. Overall, this is a high risk study.   TTE 10/12/2019 1. Extremely poor acoustic windows. LVEF appears to be depressed with  diffuse hypokinesis, worse in the mid/distal inferior/inferoseptal walls.  . Left ventricular ejection fraction, by estimation, is 30 to 35%. The  left ventricle has moderately  decreased function. The left ventricular internal cavity size was mildly  dilated. Left ventricular diastolic parameters are indeterminate.  2. Right ventricular systolic function is moderately reduced. The right  ventricular size is mildly enlarged.  3. The mitral valve is normal in structure. Trivial mitral valve  regurgitation.  4. The aortic valve is normal in structure. Aortic valve regurgitation is  not visualized.    Recent Labs: 10/26/2019: TSH 4.137 11/08/2019: ALT 18; Hemoglobin 9.3; Platelets 297 11/09/2019: B Natriuretic Peptide 28.8 11/14/2019: BUN 6; Creatinine, Ser 0.70; Magnesium 1.8; Potassium 3.6; Sodium 137   Recent Lipid Panel    Component Value Date/Time   CHOL 168 10/26/2019 0402   TRIG 130 10/26/2019 0402   HDL 43 10/26/2019 0402   CHOLHDL 3.9 10/26/2019 0402   VLDL 26 10/26/2019 0402   LDLCALC 99 10/26/2019 0402    Physical Exam:   VS:  BP 124/90   Pulse (!) 119   Ht 6' 2"  (1.88 m)   Wt 230 lb (104.3 kg)   SpO2 98%   BMI 29.53 kg/m    Wt Readings from Last 3 Encounters:  01/23/20 230 lb (104.3 kg)  01/10/20 238 lb (108 kg)  12/22/19 238 lb 3.2 oz (108 kg)    General: Well nourished, well developed, in no acute distress Heart: Atraumatic, normal size  Eyes: PEERLA, EOMI  Neck: Supple, no JVD Endocrine: No thryomegaly Cardiac: Normal S1, S2; RRR; no murmurs, rubs, or gallops Lungs: Clear to auscultation bilaterally, no wheezing, rhonchi or rales  Abd: Soft, nontender, no hepatomegaly  Ext: No edema, pulses 2+ Musculoskeletal: No deformities, BUE and BLE strength normal and equal Skin: Warm and dry, no rashes   Neuro: Alert and oriented to person, place, time, and situation, CNII-XII grossly intact, no focal deficits  Psych: Normal mood and affect   ASSESSMENT:   Micheal Bell is a 49 y.o. male who presents for the following: 1. Chronic systolic heart failure ([place])   2. SOB (shortness of breath)   3. Other chest pain   4. Abnormal stress test   5. Coronary artery disease involving native coronary artery of native heart with unstable angina pectoris ([place])   6. Mixed hyperlipidemia   7. Acute deep vein thrombosis (DVT) of popliteal vein of right lower extremity (HCC)     PLAN:   1. Chronic systolic heart failure ([place]) 2. SOB (shortness of breath) -EF was initially in the hospital 30 to 35%.  This had improved but his stress test versus EF is 34%.  Findings are  concerning for ischemic cardiomyopathy.  He is euvolemic.  He will continue Lasix 20 mg daily.  We will start him on Coreg 3.125 mg twice daily as well as losartan 12.5 mg daily.  We will work to continue to optimize medical therapy but do need a left heart catheterization.  See discussion below.  He will likely benefit from [facility] but I would like to get his heart catheterization done first and then we can work to get a month these medications.  He will also need a repeat echocardiogram given the inconsistency in his transthoracic study and transesophageal study and now his nuclear medicine stress test.  3. Other chest pain 4. Abnormal stress test 5. Coronary artery disease involving native coronary artery of native heart with unstable angina pectoris [facility]) -Nuclear medicine stress test with medium size fixed defect in the inferior wall.  Associated hypokinesis was noted.  EF 34%.  He reports continued exertional chest pain or pressure.  Symptoms are concerning for unstable angina.  I have recommended left heart catheterization.  I suspect he may end up having triple-vessel disease given his uncontrolled diabetes.  He is on Eliquis for a lower extremity DVT.  He will complete 3 months of December 17.  We will plan for left heart catheterization on December 20.  He will not continue on anticoagulation after this date.  He has completed 3 months at that time I think this will be okay.  His DVT was found in the setting of sepsis and was clearly provoked. -We will start him on aspirin 81 mg daily.  He will complete his Eliquis on December 17.  He is on Lipitor 40 mg daily.   -He was given a prescription for sublingual nitroglycerin.  No symptoms at rest.  Should he take more than 3 doses, 3 to 5 minutes per dose, with persistent chest pain he will proceed to the emergency room.  He was in agreement with this plan.  We will try to do this as an outpatient.  6. Mixed hyperlipidemia -Continue Lipitor 40 mg  daily.  Uncontrolled due to diabetes.  Hopefully LDL improved with diabetes control and Lipitor.  7. Acute deep vein thrombosis (DVT) of popliteal vein of right lower extremity (HCC) -R popliteal vein eliquis started 11/11/2019.  This was in the setting of sepsis and bacteremia.  He is complete antibiotics.  We will proceed with left heart catheterization on 02/12/2020.  He will complete 3 months of Eliquis on 12/10/2019.  He will then stop and not restart after his heart catheterization.  I think this is the safest way to perform his left heart catheterization.   Disposition: Return in about 6 weeks (around 03/05/2020).  Medication Adjustments/Labs and Tests Ordered: Current medicines are reviewed at length with the patient today.  Concerns regarding medicines are outlined above.  Orders Placed This Encounter  Procedures  . Basic metabolic panel  . CBC   Meds ordered this encounter  Medications  . nitroGLYCERIN (NITROSTAT) 0.4 MG SL tablet    Sig: Place 1 tablet (0.4 mg total) under the tongue every 5 (five) minutes as needed for chest pain.    Dispense:  25 tablet    Refill:  11  . carvedilol (COREG) 3.125 MG tablet    Sig: Take 1 tablet (3.125 mg total) by mouth 2 (two) times daily.    Dispense:  180 tablet    Refill:  3  . losartan (COZAAR) 25 MG tablet    Sig: Take 1/2 tablet ( 12.5 mg ) daily  Dispense:  45 tablet    Refill:  3    Patient Instructions  Medication Instructions:  Start taking Aspirin 81 mg daily Start taking Coreg 3/125 mg twice a day Start taking Losartan 25 mg 1/2 tablet daily Start taking NTG under tongue as needed for chest pain Stop Eliquis on 02/09/20 Continue all other medications  *If you need a refill on your cardiac medications before your next appointment, please call your pharmacy*   Lab Work: Bmet,cbc today   Testing/Procedures: Cardiac Cath scheduled Monday 02/12/20  Follow instructions below   Follow-Up: At [facility], you  and your health needs are our priority.  As part of our continuing mission to provide you with exceptional heart care, we have created designated Provider Care Teams.  These Care Teams include your primary Cardiologist (physician) and Advanced Practice Providers (APPs -  Physician Assistants and Nurse Practitioners) who all work together to provide you with the care you need, when you need it.  We recommend signing up for the patient portal called "MyChart".  Sign up information is provided on this After Visit Summary.  MyChart is used to connect with patients for Virtual Visits (Telemedicine).  Patients are able to view lab/test results, encounter notes, upcoming appointments, etc.  Non-urgent messages can be sent to your provider as well.   To learn more about what you can do with MyChart, go to [email or web address].    Your next appointment: Monday 03/25/20 at 11:20 am    The format for your next appointment: Office    Provider:  [place] [id number] Dept: [phone] Loc: [place]  01/23/2020  You are scheduled for a Cardiac Cath on Monday 02/12/20,  with Dr.Cooper.  1. Please arrive at the [facility] (Main Entrance A) at [facility]: [address] [place], [place] [address] at 5:30 am (This time is two hours before your procedure to ensure your preparation). Free valet parking service is available.   Special note: Every effort is made to have your procedure done on time. Please understand that emergencies sometimes delay scheduled procedures.  2. Diet: Do not eat solid foods after midnight.  The patient may have clear liquids until 5am upon the day of the procedure.  3. Labs: You will need to have blood drawn on Tuesday 01/23/20 You do not need to be fasting.   Covid Test scheduled Friday 02/09/20 at 1:25 pm at  [facility] [employer] [facility] until after [employer].  4. Medication instructions in preparation for your procedure:   Hold Insulin morning of Cath  Hold Furosemide morning of Cath    On the morning of your procedure, take your Aspirin 81 mg and any morning medicines NOT listed above.  You may use sips of water.  5. Plan for one night stay--bring personal belongings. 6. Bring a current list of your medications and current insurance cards. 7. You MUST have a responsible person to drive you home. 8. Someone MUST be with you the first 24 hours after you arrive home or your discharge will be delayed. 9. Please wear clothes that are easy to get on and off and wear slip-on shoes.  Thank you for allowing [place] to care for you!   -- [facility]     Time Spent with Patient: I have spent a total  of 35 minutes with patient reviewing hospital notes, telemetry, EKGs, labs and examining the patient as well as establishing an assessment and plan that was discussed with the patient.  > 50% of time was spent in direct patient care.  Signed, Addison Naegeli. Audie Box, [place]  [address] [place], [place] [address] [phone]  01/23/2020 3:43 PM

## 2020-01-22 ENCOUNTER — Ambulatory Visit: Payer: Medicaid Other | Admitting: Physical Therapy

## 2020-01-22 ENCOUNTER — Other Ambulatory Visit: Payer: Self-pay

## 2020-01-22 DIAGNOSIS — M6281 Muscle weakness (generalized): Secondary | ICD-10-CM

## 2020-01-22 DIAGNOSIS — R262 Difficulty in walking, not elsewhere classified: Secondary | ICD-10-CM | POA: Diagnosis not present

## 2020-01-22 DIAGNOSIS — R2681 Unsteadiness on feet: Secondary | ICD-10-CM

## 2020-01-22 NOTE — Therapy (Signed)
[name] [place], [place], [address] Phone: [phone]   Fax:  [phone]  Physical Therapy Treatment  Patient Details  Name: Micheal Bell MRN: [id number] Date of Birth: 1970/02/26 Referring Provider (PT): Holli Humbles, MD   Encounter Date: 01/22/2020   PT End of Session - 01/22/20 0821    Visit Number 16    Number of Visits 18    Date for PT Re-Evaluation 01/25/20    PT Start Time 0815    PT Stop Time 8446    PT Time Calculation (min) 42 min    Activity Tolerance Patient tolerated treatment well    Behavior During Therapy [facility] for tasks assessed/performed           Past Medical History:  Diagnosis Date  . Arthritis   . Asthma   . Diabetes mellitus without complication ([place])   . DVT (deep venous thrombosis) ([place])   . Fibromyalgia   . Gout   . Hypertension   . IBS (irritable bowel syndrome)   . Vein disorder     Past Surgical History:  Procedure Laterality Date  . FOOT SURGERY    . TEE WITHOUT CARDIOVERSION N/A 10/24/2019   Procedure: TRANSESOPHAGEAL ECHOCARDIOGRAM (TEE);  Surgeon: Josue Hector, MD;  Location: [facility] ENDOSCOPY;  Service: Cardiovascular;  Laterality: N/A;    There were no vitals filed for this visit.   Subjective Assessment - 01/22/20 0816    Subjective COVID-19 screening performed upon arrival. Patient reported doing well today. Some ongoing back discomfort.    Pertinent History HTN, DM, history of COVID-19, CHF, Asthma    Limitations House hold activities;Standing;Walking    How long can you walk comfortably? short periods around home    Patient Stated Goals improve mobility    Currently in Pain? Yes    Pain Score --   no number rating given   Pain Location Back    Pain Orientation Lower    Pain Descriptors / Indicators Discomfort    Pain Type Chronic pain    Pain Onset More than a month ago    Pain Frequency Constant    Aggravating Factors  certain movements    Pain  Relieving Factors at rest                             [facility] Adult PT Treatment/Exercise - 01/22/20 0001      Lumbar Exercises: Aerobic   Nustep L3 x42mn (80-90 SPM) LE only      Lumbar Exercises: Seated   Other Seated Lumbar Exercises seated core sets with red swiss ball x30      Knee/Hip Exercises: Standing   Heel Raises Both;2 sets;10 reps    Hip Flexion Stengthening;Both;2 sets;10 reps;Knee bent    Hip Extension Stengthening;Both;2 sets;10 reps;Knee straight      Knee/Hip Exercises: Seated   Long Arc Quad Strengthening;Both;3 sets;10 reps    Long Arc Quad Weight 4 lbs.    Other Seated Knee/Hip Exercises hip abd with green band x30    Other Seated Knee/Hip Exercises red swiss ball for kne squeeze x fatigue    Marching Strengthening;Both;2 sets;10 reps;Weights    Marching Limitations 4    Hamstring Curl Strengthening;Both;20 reps    Hamstring Limitations green band                    PT Short Term Goals - 01/01/20 05207  PT SHORT TERM GOAL #1   Title Patient will be independent with HEP    Baseline Met 11/27/19    Time 3    Period Weeks    Status Achieved      PT SHORT TERM GOAL #2   Title Patient will perform 5 sit to stands in 30 seconds or less with bilateral UE support to improve functional LE strength.    Baseline 6 sit to stand in 30 seconds Met 01/01/20    Time 3    Period Weeks    Status Achieved             PT Long Term Goals - 01/15/20 3273      PT LONG TERM GOAL #1   Title Patient will be independent with advanced HEP    Time 8    Period Weeks    Status On-going      PT LONG TERM GOAL #2   Title Patient will demonstrate 3+/5 right hip flexion MMT to improve ability to perform functional tasks.    Baseline 3/5 right hip MMT 01/15/20    Time 8    Period Weeks    Status On-going      PT LONG TERM GOAL #3   Title Patient will ambulate with rolling walker or LRAD with step through gait pattern and equal step  lengths to improve gait mechanics.    Baseline Ambulating with rolling walker with narrow base of support and varying step to and step through pattern, decreased right step length    Time 8    Period Weeks    Status On-going      PT LONG TERM GOAL #4   Title Patient will report ability to perform bed mobility, transfers, and ADLs independently with proper sequencing and technique.    Baseline Patient required less yet some assist this weekend due to back pain 11/22//21    Time 8    Period Weeks    Status On-going      PT LONG TERM GOAL #5   Title Patient will demonstrate 15+ lbs of right grip strength force to improve ability to perform fine motor tasks and functional tasks.    Baseline Met 26# 12/28/19    Time 8    Period Weeks    Status Achieved                 Plan - 01/22/20 0849    Clinical Impression Statement Patient tolerated treatment well today and vitals WNL throughout. Patient progressing with activity tolerance on nustep and with all exercises. Patient continues to have some low back pain with certain movements. Patient currrent goals progressing. MD F/U for his prior test results tomorrow.    Personal Factors and Comorbidities Comorbidity 3+    Comorbidities HTN, DM, history of COVID-19, CHF, Asthma,  L sided stroke    Examination-Activity Limitations Stand;Dressing;Hygiene/Grooming;Transfers;Locomotion Level;Bed Mobility;Stairs    Examination-Participation Restrictions Occupation    Stability/Clinical Decision Making Evolving/Moderate complexity    Rehab Potential Good    PT Frequency 2x / week    PT Duration 8 weeks    PT Treatment/Interventions ADLs/Self Care Home Management;Neuromuscular re-education;Manual techniques;Passive range of motion;Balance training;Therapeutic exercise;Therapeutic activities;Stair training;Functional mobility training;Gait training;Patient/family education    PT Next Visit Plan cont with POC and Monitor fatigue and vitals. gentle LE  and UE strengthening, core and ROM exercises, gait training and resistance walking    Consulted and Agree with Plan of Care Patient  Patient will benefit from skilled therapeutic intervention in order to improve the following deficits and impairments:  Abnormal gait, Difficulty walking, Pain, Decreased balance, Decreased activity tolerance, Decreased strength, Postural dysfunction, Decreased endurance  Visit Diagnosis: Difficulty in walking, not elsewhere classified  Muscle weakness (generalized)  Unsteadiness on feet     Problem List Patient Active Problem List   Diagnosis Date Noted  . Cerebritis 12/06/2019  . Cutaneous abscess of left foot   . Diabetic foot ([place])   . Acute systolic heart failure ([place])   . Embolic infarction ([place])   . Hyperglycemia   . Hypoxia   . Lower respiratory tract infection due to COVID-19 virus 10/10/2019  . Benign hypertension 07/23/2015  . Gastro-esophageal reflux disease with esophagitis 07/23/2015  . Gout 07/23/2015  . Obstructive sleep apnea 07/23/2015  . Snoring 07/23/2015  . Chronic idiopathic gout of multiple sites 06/02/2015  . Class 2 obesity in adult 06/02/2015  . Fibromyalgia 06/02/2015  . Irritable bowel syndrome with diarrhea 06/02/2015  . Mild persistent asthma 06/02/2015  . Reaction, adjustment, with depressed mood, prolonged 06/02/2015  . Type 2 diabetes mellitus without complication, without long-term current use of insulin ([place]) 06/02/2015    Seleste Tallman P, PTA 01/22/2020, 9:02 AM  [facility] [place], [place], [address] Phone: [phone]   Fax:  [phone]  Name: Micheal Bell MRN: [id number] Date of Birth: Apr 14, 1970

## 2020-01-23 ENCOUNTER — Ambulatory Visit (INDEPENDENT_AMBULATORY_CARE_PROVIDER_SITE_OTHER): Payer: Medicaid Other | Admitting: Cardiovascular Disease

## 2020-01-23 ENCOUNTER — Encounter: Payer: Self-pay | Admitting: Cardiovascular Disease

## 2020-01-23 VITALS — BP 124/90 | HR 119 | Ht 74.0 in | Wt 230.0 lb

## 2020-01-23 DIAGNOSIS — I82431 Acute embolism and thrombosis of right popliteal vein: Secondary | ICD-10-CM

## 2020-01-23 DIAGNOSIS — R0789 Other chest pain: Secondary | ICD-10-CM | POA: Diagnosis not present

## 2020-01-23 DIAGNOSIS — I25110 Atherosclerotic heart disease of native coronary artery with unstable angina pectoris: Secondary | ICD-10-CM | POA: Diagnosis not present

## 2020-01-23 DIAGNOSIS — I5022 Chronic systolic (congestive) heart failure: Secondary | ICD-10-CM | POA: Diagnosis not present

## 2020-01-23 DIAGNOSIS — R9439 Abnormal result of other cardiovascular function study: Secondary | ICD-10-CM | POA: Diagnosis not present

## 2020-01-23 DIAGNOSIS — R0602 Shortness of breath: Secondary | ICD-10-CM | POA: Diagnosis not present

## 2020-01-23 DIAGNOSIS — E782 Mixed hyperlipidemia: Secondary | ICD-10-CM | POA: Diagnosis not present

## 2020-01-23 MED ORDER — NITROGLYCERIN 0.4 MG SL SUBL: 0.4000 mg | SUBLINGUAL_TABLET | SUBLINGUAL | 11 refills | Status: DC | PRN

## 2020-01-23 MED ORDER — CARVEDILOL 3.125 MG PO TABS: 3.1250 mg | ORAL_TABLET | Freq: Two times a day (BID) | ORAL | 3 refills | Status: DC

## 2020-01-23 MED ORDER — LOSARTAN POTASSIUM 25 MG PO TABS: ORAL_TABLET | ORAL | 3 refills | Status: DC

## 2020-01-23 NOTE — Patient Instructions (Signed)
Medication Instructions:  Start taking Aspirin 81 mg daily Start taking Coreg 3/125 mg twice a day Start taking Losartan 25 mg 1/2 tablet daily Start taking NTG under tongue as needed for chest pain Stop Eliquis on 02/09/20 Continue all other medications  *If you need a refill on your cardiac medications before your next appointment, please call your pharmacy*   Lab Work: Bmet,cbc today   Testing/Procedures: Cardiac Cath scheduled Monday 02/12/20  Follow instructions below   Follow-Up: At [facility], you and your health needs are our priority.  As part of our continuing mission to provide you with exceptional heart care, we have created designated Provider Care Teams.  These Care Teams include your primary Cardiologist (physician) and Advanced Practice Providers (APPs -  Physician Assistants and Nurse Practitioners) who all work together to provide you with the care you need, when you need it.  We recommend signing up for the patient portal called "MyChart".  Sign up information is provided on this After Visit Summary.  MyChart is used to connect with patients for Virtual Visits (Telemedicine).  Patients are able to view lab/test results, encounter notes, upcoming appointments, etc.  Non-urgent messages can be sent to your provider as well.   To learn more about what you can do with MyChart, go to [email or web address].    Your next appointment: Monday 03/25/20 at 11:20 am    The format for your next appointment: Office    Provider:  Dr.O'Neal         [facility] GROUP [facility] CARDIOVASCULAR DIVISION [facility] [address] [place] [address] Dept: [phone] Loc: [phone]  NICKALAS MCCARRICK  01/23/2020  You are scheduled for a Cardiac Cath on Monday 02/12/20,  with Dr.Cooper.  1. Please arrive at the [facility] (Main Entrance A) at [facility]: [address] [place], [place] [address] at 5:30 am (This time  is two hours before your procedure to ensure your preparation). Free valet parking service is available.   Special note: Every effort is made to have your procedure done on time. Please understand that emergencies sometimes delay scheduled procedures.  2. Diet: Do not eat solid foods after midnight.  The patient may have clear liquids until 5am upon the day of the procedure.  3. Labs: You will need to have blood drawn on Tuesday 01/23/20 You do not need to be fasting.   Covid Test scheduled Friday 02/09/20 at 1:25 pm at [address] [employer] until after [employer].  4. Medication instructions in preparation for your procedure:   Hold Insulin morning of Cath  Hold Furosemide morning of Cath    On the morning of your procedure, take your Aspirin 81 mg and any morning medicines NOT listed above.  You may use sips of water.  5. Plan for one night stay--bring personal belongings. 6. Bring a current list of your medications and current insurance cards. 7. You MUST have a responsible person to drive you home. 8. Someone MUST be with you the first 24 hours after you arrive home or your discharge will be delayed. 9. Please wear clothes that are easy to get on and off and wear slip-on shoes.  Thank you for allowing [place] to care for you!   -- [place] [facility]

## 2020-01-24 ENCOUNTER — Other Ambulatory Visit: Payer: Self-pay | Admitting: *Deleted

## 2020-01-24 DIAGNOSIS — M6281 Muscle weakness (generalized): Secondary | ICD-10-CM | POA: Diagnosis not present

## 2020-01-24 LAB — CBC
Hematocrit: 43.4 % (ref 37.5–51.0)
Hemoglobin: 14.8 g/dL (ref 13.0–17.7)
MCH: 29.3 pg (ref 26.6–33.0)
MCHC: 34.1 g/dL (ref 31.5–35.7)
MCV: 86 fL (ref 79–97)
Platelets: 418 10*3/uL (ref 150–450)
RBC: 5.05 x10E6/uL (ref 4.14–5.80)
RDW: 13.1 % (ref 11.6–15.4)
WBC: 12.2 10*3/uL — ABNORMAL HIGH (ref 3.4–10.8)

## 2020-01-24 LAB — BASIC METABOLIC PANEL
BUN/Creatinine Ratio: 15 (ref 9–20)
BUN: 13 mg/dL (ref 6–24)
CO2: 30 mmol/L — ABNORMAL HIGH (ref 20–29)
Calcium: 10.6 mg/dL — ABNORMAL HIGH (ref 8.7–10.2)
Chloride: 87 mmol/L — ABNORMAL LOW (ref 96–106)
Creatinine, Ser: 0.89 mg/dL (ref 0.76–1.27)
GFR calc Af Amer: 114 mL/min/{1.73_m2} (ref >59)
GFR calc non Af Amer: 99 mL/min/{1.73_m2} (ref >59)
Glucose: 216 mg/dL — ABNORMAL HIGH (ref 65–99)
Potassium: 3.4 mmol/L — ABNORMAL LOW (ref 3.5–5.2)
Sodium: 137 mmol/L (ref 134–144)

## 2020-01-24 MED ORDER — POTASSIUM CHLORIDE ER 20 MEQ PO TBCR: 20.0000 meq | EXTENDED_RELEASE_TABLET | Freq: Every day | ORAL | 3 refills | Status: DC

## 2020-01-25 ENCOUNTER — Other Ambulatory Visit: Payer: Self-pay

## 2020-01-25 ENCOUNTER — Ambulatory Visit: Payer: Medicaid Other | Attending: Internal Medicine | Admitting: Physical Therapy

## 2020-01-25 DIAGNOSIS — R2681 Unsteadiness on feet: Secondary | ICD-10-CM | POA: Insufficient documentation

## 2020-01-25 DIAGNOSIS — R262 Difficulty in walking, not elsewhere classified: Secondary | ICD-10-CM | POA: Diagnosis not present

## 2020-01-25 DIAGNOSIS — M6281 Muscle weakness (generalized): Secondary | ICD-10-CM | POA: Insufficient documentation

## 2020-01-25 NOTE — Therapy (Signed)
[name] [place], [place], [address] Phone: [phone]   Fax:  [phone]  Physical Therapy Treatment  Patient Details  Name: Micheal Bell MRN: [id number] Date of Birth: [birth year] Referring Provider (PT): Holli Humbles, MD   Encounter Date: 01/25/2020   PT End of Session - 01/25/20 0824    Visit Number 17    Number of Visits 18    Date for PT Re-Evaluation 01/25/20    PT Start Time 0815    PT Stop Time 0855    PT Time Calculation (min) 40 min    Activity Tolerance Patient tolerated treatment well    Behavior During Therapy [facility] for tasks assessed/performed           Past Medical History:  Diagnosis Date  . Arthritis   . Asthma   . Diabetes mellitus without complication ([place])   . DVT (deep venous thrombosis) ([place])   . Fibromyalgia   . Gout   . Hypertension   . IBS (irritable bowel syndrome)   . Vein disorder     Past Surgical History:  Procedure Laterality Date  . FOOT SURGERY    . TEE WITHOUT CARDIOVERSION N/A 10/24/2019   Procedure: TRANSESOPHAGEAL ECHOCARDIOGRAM (TEE);  Surgeon: Josue Hector, MD;  Location: [facility] ENDOSCOPY;  Service: Cardiovascular;  Laterality: N/A;    There were no vitals filed for this visit.   Subjective Assessment - 01/25/20 0821    Subjective COVID-19 screening performed upon arrival. Patient reported doing well today, he reported he got results from doctor and he will need further surgery.    Pertinent History HTN, DM, history of COVID-19, CHF, Asthma    Limitations House hold activities;Standing;Walking    How long can you walk comfortably? short periods around home    Patient Stated Goals improve mobility    Currently in Pain? Yes    Pain Score --   No number given   Pain Location Back    Pain Orientation Lower    Pain Descriptors / Indicators Discomfort    Pain Type Chronic pain    Pain Onset More than a month ago    Pain Frequency Constant    Aggravating  Factors  certaim movements or prolong activity    Pain Relieving Factors rest                             [facility] Adult PT Treatment/Exercise - 01/25/20 0001      Lumbar Exercises: Aerobic   Nustep L4 x62mn (70-80 SPM) LE only      Lumbar Exercises: Seated   Other Seated Lumbar Exercises seated core sets with red swiss ball x30    Other Seated Lumbar Exercises seated trunk rotations with grey ball x20      Knee/Hip Exercises: Seated   Long Arc Quad Strengthening;Both;3 sets;10 reps    Long Arc Quad Weight 4 lbs.    Other Seated Knee/Hip Exercises hip abd with green band x30    Other Seated Knee/Hip Exercises red swiss ball for kne squeeze x fatigue    Marching Strengthening;Both;2 sets;10 reps;Weights    Marching Limitations 4    Hamstring Curl Strengthening;Both;20 reps    Hamstring Limitations green band                    PT Short Term Goals - 01/01/20 0839      PT SHORT TERM GOAL #1   Title  Patient will be independent with HEP    Baseline Met 11/27/19    Time 3    Period Weeks    Status Achieved      PT SHORT TERM GOAL #2   Title Patient will perform 5 sit to stands in 30 seconds or less with bilateral UE support to improve functional LE strength.    Baseline 6 sit to stand in 30 seconds Met 01/01/20    Time 3    Period Weeks    Status Achieved             PT Long Term Goals - 01/25/20 0841      PT LONG TERM GOAL #1   Title Patient will be independent with advanced HEP    Baseline Patient doing well with initial HEP unable to progress due to upcoming surgery 01/25/20 MET    Time 8    Period Weeks    Status Achieved      PT LONG TERM GOAL #2   Title Patient will demonstrate 3+/5 right hip flexion MMT to improve ability to perform functional tasks.    Baseline 3/5 right hip MMT 01/25/20    Time 8    Period Weeks    Status On-going      PT LONG TERM GOAL #3   Title Patient will ambulate with rolling walker or LRAD with step  through gait pattern and equal step lengths to improve gait mechanics.    Baseline Ambulating with rolling walker with narrow base of support and varying step to and step through pattern, decreased right step length 01/25/20    Time 8    Period Weeks    Status On-going      PT LONG TERM GOAL #4   Title Patient will report ability to perform bed mobility, transfers, and ADLs independently with proper sequencing and technique.    Baseline Patient independent with bed mobility and able to perform most ADL's yet need assist with bathing 12/21    Time 8    Period Weeks    Status Partially Met      PT LONG TERM GOAL #5   Title Patient will demonstrate 15+ lbs of right grip strength force to improve ability to perform fine motor tasks and functional tasks.    Baseline Met 26# 12/28/19    Time 8    Period Weeks    Status Achieved                 Plan - 01/25/20 6812    Clinical Impression Statement Patient tolerated treatment well today. Focused on seated exercises due to his upcoming surgery and maintain his LE strengthening. Patient is doing well with inital HEP and will not require an updated one at this time. Patient is independent with bed mobility yet needs assist with bathing. Patient has overall improved with strength, endurance and activity yet will need a surgury for other medical issues to help improve current condition per reported. Met LTG #1 with remaining goals ongoing.    Personal Factors and Comorbidities Comorbidity 3+    Comorbidities HTN, DM, history of COVID-19, CHF, Asthma,  L sided stroke    Examination-Activity Limitations Stand;Dressing;Hygiene/Grooming;Transfers;Locomotion Level;Bed Mobility;Stairs    Examination-Participation Restrictions Occupation    Stability/Clinical Decision Making Evolving/Moderate complexity    Rehab Potential Good    PT Frequency 2x / week    PT Duration 8 weeks    PT Treatment/Interventions ADLs/Self Care Home Management;Neuromuscular  re-education;Manual techniques;Passive range of motion;Balance  training;Therapeutic exercise;Therapeutic activities;Stair training;Functional mobility training;Gait training;Patient/family education    PT Next Visit Plan Assess goals and DC next visit    Consulted and Agree with Plan of Care Patient           Patient will benefit from skilled therapeutic intervention in order to improve the following deficits and impairments:  Abnormal gait, Difficulty walking, Pain, Decreased balance, Decreased activity tolerance, Decreased strength, Postural dysfunction, Decreased endurance  Visit Diagnosis: Difficulty in walking, not elsewhere classified  Muscle weakness (generalized)  Unsteadiness on feet     Problem List Patient Active Problem List   Diagnosis Date Noted  . Cerebritis 12/06/2019  . Cutaneous abscess of left foot   . Diabetic foot ([place])   . Acute systolic heart failure ([place])   . Embolic infarction ([place])   . Hyperglycemia   . Hypoxia   . Lower respiratory tract infection due to COVID-19 virus 10/10/2019  . Benign hypertension 07/23/2015  . Gastro-esophageal reflux disease with esophagitis 07/23/2015  . Gout 07/23/2015  . Obstructive sleep apnea 07/23/2015  . Snoring 07/23/2015  . Chronic idiopathic gout of multiple sites 06/02/2015  . Class 2 obesity in adult 06/02/2015  . Fibromyalgia 06/02/2015  . Irritable bowel syndrome with diarrhea 06/02/2015  . Mild persistent asthma 06/02/2015  . Reaction, adjustment, with depressed mood, prolonged 06/02/2015  . Type 2 diabetes mellitus without complication, without long-term current use of insulin ([place]) 06/02/2015    Per Beagley P, PTA 01/25/2020, 9:08 AM  [facility] [place], [place], [address] Phone: [phone]   Fax:  [phone]  Name: Micheal Bell MRN: [id number] Date of Birth: 06-20-1970

## 2020-01-29 ENCOUNTER — Ambulatory Visit: Payer: Medicaid Other | Admitting: Physical Therapy

## 2020-01-29 ENCOUNTER — Encounter: Payer: Self-pay | Admitting: Physical Therapy

## 2020-01-29 ENCOUNTER — Other Ambulatory Visit: Payer: Self-pay

## 2020-01-29 DIAGNOSIS — M6281 Muscle weakness (generalized): Secondary | ICD-10-CM | POA: Diagnosis not present

## 2020-01-29 DIAGNOSIS — R2681 Unsteadiness on feet: Secondary | ICD-10-CM | POA: Diagnosis not present

## 2020-01-29 DIAGNOSIS — R262 Difficulty in walking, not elsewhere classified: Secondary | ICD-10-CM

## 2020-01-29 NOTE — Therapy (Signed)
[name] [place], [place], [address] Phone: [phone]   Fax:  [phone]  Physical Therapy Treatment PHYSICAL THERAPY DISCHARGE SUMMARY  Visits from Start of Care: 18  Current functional level related to goals / functional outcomes: See below   Remaining deficits: See goals   Education / Equipment: HEP Plan: Patient agrees to discharge.  Patient goals were partially met. Patient is being discharged due to the patient's request.  ?????   Patient to have heart surgery in next coming weeks.   Gabriela Eves, PT, DPT 01/29/20   Patient Details  Name: BAZIL DHANANI MRN: [id number] Date of Birth: Jun 07, 1970 Referring Provider (PT): Holli Humbles, MD   Encounter Date: 01/29/2020   PT End of Session - 01/29/20 0820    Visit Number 18    Number of Visits 18    Date for PT Re-Evaluation 01/25/20    PT Start Time 0815    PT Stop Time 0858    PT Time Calculation (min) 43 min    Activity Tolerance Patient tolerated treatment well    Behavior During Therapy [facility] for tasks assessed/performed           Past Medical History:  Diagnosis Date  . Arthritis   . Asthma   . Diabetes mellitus without complication ([place])   . DVT (deep venous thrombosis) ([place])   . Fibromyalgia   . Gout   . Hypertension   . IBS (irritable bowel syndrome)   . Vein disorder     Past Surgical History:  Procedure Laterality Date  . FOOT SURGERY    . TEE WITHOUT CARDIOVERSION N/A 10/24/2019   Procedure: TRANSESOPHAGEAL ECHOCARDIOGRAM (TEE);  Surgeon: Josue Hector, MD;  Location: [facility] ENDOSCOPY;  Service: Cardiovascular;  Laterality: N/A;    There were no vitals filed for this visit.   Subjective Assessment - 01/29/20 0829    Subjective COVID-19 screening performed upon arrival. Patient reports improvements in functional tasks at home. States would feel better the residual effects from the stroke would improve so he could do  more.    Patient is accompained by: Family member    Pertinent History HTN, DM, history of COVID-19, CHF, Asthma    Limitations House hold activities;Standing;Walking    How long can you walk comfortably? short periods around home    Patient Stated Goals improve mobility    Currently in Pain? Yes   no number on pain scale provided   Pain Location Back              [facility] PT Assessment - 01/29/20 0001      Assessment   Medical Diagnosis ambulatory disfunction, weakness1    Referring Provider (PT) Holli Humbles, MD    Onset Date/Surgical Date 10/10/19    Hand Dominance Right    Next MD Visit n/a    Prior Therapy In hospital      Strength   Right Hip Flexion 3+/5    Left Hip Flexion 4/5    Right Knee Flexion 4+/5    Right Knee Extension 4/5    Left Knee Flexion 4+/5    Left Knee Extension 4/5                         [facility] Adult PT Treatment/Exercise - 01/29/20 0001      Lumbar Exercises: Aerobic   Nustep L4 x68mn (7-80 SPM) LE only      Lumbar Exercises: Seated  Other Seated Lumbar Exercises seated trunk rotations with grey ball x20      Knee/Hip Exercises: Seated   Long Arc Quad Strengthening;Both;3 sets;10 reps    Long Arc Quad Weight 4 lbs.    Other Seated Knee/Hip Exercises hip abd with green band x30    Marching Strengthening;Both;2 sets;10 reps;Weights    Marching Limitations 4    Hamstring Curl Strengthening;Both;20 reps    Hamstring Limitations green band    Sit to Sand 10 reps;with UE support;without UE support                    PT Short Term Goals - 01/01/20 0839      PT SHORT TERM GOAL #1   Title Patient will be independent with HEP    Baseline Met 11/27/19    Time 3    Period Weeks    Status Achieved      PT SHORT TERM GOAL #2   Title Patient will perform 5 sit to stands in 30 seconds or less with bilateral UE support to improve functional LE strength.    Baseline 6 sit to stand in 30 seconds Met 01/01/20    Time 3     Period Weeks    Status Achieved             PT Long Term Goals - 01/29/20 0857      PT LONG TERM GOAL #1   Title Patient will be independent with advanced HEP    Baseline Patient doing well with initial HEP unable to progress due to upcoming surgery 01/25/20 MET    Time 8    Period Weeks    Status Achieved      PT LONG TERM GOAL #2   Title Patient will demonstrate 3+/5 right hip flexion MMT to improve ability to perform functional tasks.    Baseline 3/5 right hip MMT 01/25/20    Time 8    Period Weeks    Status Partially Met      PT LONG TERM GOAL #3   Title Patient will ambulate with rolling walker or LRAD with step through gait pattern and equal step lengths to improve gait mechanics.    Baseline Ambulating with rolling walker with narrow base of support and varying step to and step through pattern, decreased right step length 01/25/20    Time 8    Period Weeks    Status Partially Met      PT LONG TERM GOAL #4   Title Patient will report ability to perform bed mobility, transfers, and ADLs independently with proper sequencing and technique.    Baseline Patient independent with bed mobility and able to perform most ADL's yet need assist with bathing 12/21    Time 8    Period Weeks    Status Partially Met      PT LONG TERM GOAL #5   Title Patient will demonstrate 15+ lbs of right grip strength force to improve ability to perform fine motor tasks and functional tasks.    Baseline Met 26# 12/28/19    Time 8    Period Weeks    Status Achieved                 Plan - 01/29/20 0839    Clinical Impression Statement Patient responded well to therapy session with emphasis on seated TEs secondary to upcoming surgery. Patient's vitals within normal limits throughout session. Patient and PT discussed HEP and discussed how to maintain  after PT and may possibly see about joining the [facility]. Patient instructed to ask cardiologist prior to surgery regarding how much physical  activity he is able to do post op. Patient reported understanding. Patient's goals partially met.    Personal Factors and Comorbidities Comorbidity 3+    Comorbidities HTN, DM, history of COVID-19, CHF, Asthma,  L sided stroke    Examination-Activity Limitations Stand;Dressing;Hygiene/Grooming;Transfers;Locomotion Level;Bed Mobility;Stairs    Examination-Participation Restrictions Occupation    Stability/Clinical Decision Making Evolving/Moderate complexity    Clinical Decision Making Moderate    Rehab Potential Good    PT Frequency 2x / week    PT Duration 8 weeks    PT Treatment/Interventions ADLs/Self Care Home Management;Neuromuscular re-education;Manual techniques;Passive range of motion;Balance training;Therapeutic exercise;Therapeutic activities;Stair training;Functional mobility training;Gait training;Patient/family education    PT Next Visit Plan DC    PT Home Exercise Plan see patient education    Consulted and Agree with Plan of Care Patient    Family Member Consulted Barbaraann Rondo, Jeneen Rinks           Patient will benefit from skilled therapeutic intervention in order to improve the following deficits and impairments:  Abnormal gait, Difficulty walking, Pain, Decreased balance, Decreased activity tolerance, Decreased strength, Postural dysfunction, Decreased endurance  Visit Diagnosis: Difficulty in walking, not elsewhere classified  Muscle weakness (generalized)  Unsteadiness on feet     Problem List Patient Active Problem List   Diagnosis Date Noted  . Cerebritis 12/06/2019  . Cutaneous abscess of left foot   . Diabetic foot ([place])   . Acute systolic heart failure ([place])   . Embolic infarction ([place])   . Hyperglycemia   . Hypoxia   . Lower respiratory tract infection due to COVID-19 virus 10/10/2019  . Benign hypertension 07/23/2015  . Gastro-esophageal reflux disease with esophagitis 07/23/2015  . Gout 07/23/2015  . Obstructive sleep apnea 07/23/2015  . Snoring  07/23/2015  . Chronic idiopathic gout of multiple sites 06/02/2015  . Class 2 obesity in adult 06/02/2015  . Fibromyalgia 06/02/2015  . Irritable bowel syndrome with diarrhea 06/02/2015  . Mild persistent asthma 06/02/2015  . Reaction, adjustment, with depressed mood, prolonged 06/02/2015  . Type 2 diabetes mellitus without complication, without long-term current use of insulin ([place]) 06/02/2015    Lenita Peregrina 01/29/2020, 9:43 AM  [facility] [place], [place], [address] Phone: [phone]   Fax:  [phone]  Name: MD SMOLA MRN: [id number] Date of Birth: 07/26/1970

## 2020-02-06 ENCOUNTER — Telehealth: Payer: Self-pay | Admitting: Cardiovascular Disease

## 2020-02-06 NOTE — Telephone Encounter (Signed)
Returned call to patient-patient scheduled for cath 12/20.  He wanted to clarify instructions with his Eliquis and ASA.   Advised he is told hold Eliquis for 2 day prior (NO Eliquis 18th or 19th) and he should take ASA 81 mg daily per MD instruction.   Advised to take ASA 81 mg AM of procedure.      Patient verbalized understanding.

## 2020-02-06 NOTE — Telephone Encounter (Signed)
Pt c/o medication issue:  1. Name of Medications:  apixaban (ELIQUIS) 5 MG TABS tablet Aspirin 81 mg   2. How are you currently taking this medication (dosage and times per day)? Pt taking eliquis 2x daily now. Pt not taking Aspirin  3. Are you having a reaction (difficulty breathing--STAT)?   4. What is your medication issue? Patient is scheduled to have a heart cath on 02/12/20. The patient has instructions to stop the eliquis one day and to start taking aspirin the next day.  The patient thinks that means he should not be taking both the eliquis and the aspirin on the same day. Please confirm pre-procedure instructions.

## 2020-02-08 ENCOUNTER — Telehealth: Payer: Self-pay | Admitting: *Deleted

## 2020-02-08 NOTE — Telephone Encounter (Signed)
Pt contacted pre-catheterization scheduled at [facility] for: Monday February 12, 2020 7:30 AM Verified arrival time and place: [facility] Main Entrance [facility]) at: 5:30 AM   No solid food after midnight prior to cath, clear liquids until 5 AM day of procedure.  Hold: Eliquis-none 02/10/20 until post procedure  Chlorthalidone-AM of procedure Lasix-AM of procedure Insulin-AM of procedure/1/2 usual Insulin dose HS prior to procedure  Except hold medications AM meds can be  taken pre-cath with sips of water including: ASA 81 mg   Confirmed patient has responsible adult to drive home post procedure and be with patient first 24 hours after arriving home: yes  You are allowed ONE visitor in the waiting room during the time you are at the hospital for your procedure. Both you and your visitor must wear a mask once you enter the hospital.       COVID-19 Pre-Screening Questions:  . In the past 14 days have you had any symptoms concerning for COVID-19 infection (fever, chills, cough, or new shortness of breath)? no . In the past 14 days have you been around anyone with known Covid 19?  no    Reviewed procedure/mask/visitor instructions, COVID-19 questions with patient.

## 2020-02-09 ENCOUNTER — Other Ambulatory Visit (HOSPITAL_COMMUNITY)
Admission: RE | Admit: 2020-02-09 | Discharge: 2020-02-09 | Disposition: A | Discharge status: Home / Self Care | Payer: Medicaid Other | Source: Ambulatory Visit | Attending: Cardiovascular Disease | Admitting: Cardiovascular Disease

## 2020-02-09 DIAGNOSIS — Z01812 Encounter for preprocedural laboratory examination: Secondary | ICD-10-CM | POA: Diagnosis not present

## 2020-02-09 DIAGNOSIS — Z20822 Contact with and (suspected) exposure to covid-19: Secondary | ICD-10-CM | POA: Insufficient documentation

## 2020-02-09 LAB — SARS CORONAVIRUS 2 (TAT 6-24 HRS): SARS Coronavirus 2: NEGATIVE

## 2020-02-12 ENCOUNTER — Encounter (HOSPITAL_COMMUNITY): Payer: Self-pay | Admitting: Cardiovascular Disease

## 2020-02-12 ENCOUNTER — Ambulatory Visit (HOSPITAL_COMMUNITY)
Admission: RE | Admit: 2020-02-12 | Discharge: 2020-02-12 | Disposition: A | Discharge status: Home / Self Care | Payer: Medicaid Other | Attending: Cardiovascular Disease | Admitting: Cardiovascular Disease

## 2020-02-12 ENCOUNTER — Other Ambulatory Visit: Payer: Self-pay

## 2020-02-12 ENCOUNTER — Ambulatory Visit (HOSPITAL_COMMUNITY)
Admission: RE | Disposition: A | Discharge status: Home / Self Care | Payer: Self-pay | Source: Home / Self Care | Attending: Cardiovascular Disease

## 2020-02-12 DIAGNOSIS — I2510 Atherosclerotic heart disease of native coronary artery without angina pectoris: Secondary | ICD-10-CM

## 2020-02-12 DIAGNOSIS — I25110 Atherosclerotic heart disease of native coronary artery with unstable angina pectoris: Secondary | ICD-10-CM | POA: Diagnosis not present

## 2020-02-12 DIAGNOSIS — E1165 Type 2 diabetes mellitus with hyperglycemia: Secondary | ICD-10-CM | POA: Diagnosis not present

## 2020-02-12 DIAGNOSIS — Z86718 Personal history of other venous thrombosis and embolism: Secondary | ICD-10-CM | POA: Insufficient documentation

## 2020-02-12 DIAGNOSIS — Z955 Presence of coronary angioplasty implant and graft: Secondary | ICD-10-CM | POA: Insufficient documentation

## 2020-02-12 DIAGNOSIS — I110 Hypertensive heart disease with heart failure: Secondary | ICD-10-CM | POA: Diagnosis not present

## 2020-02-12 DIAGNOSIS — Z7901 Long term (current) use of anticoagulants: Secondary | ICD-10-CM | POA: Diagnosis not present

## 2020-02-12 DIAGNOSIS — I25118 Atherosclerotic heart disease of native coronary artery with other forms of angina pectoris: Secondary | ICD-10-CM

## 2020-02-12 DIAGNOSIS — Z794 Long term (current) use of insulin: Secondary | ICD-10-CM | POA: Diagnosis not present

## 2020-02-12 DIAGNOSIS — R0602 Shortness of breath: Secondary | ICD-10-CM | POA: Insufficient documentation

## 2020-02-12 DIAGNOSIS — Z79899 Other long term (current) drug therapy: Secondary | ICD-10-CM | POA: Insufficient documentation

## 2020-02-12 DIAGNOSIS — E782 Mixed hyperlipidemia: Secondary | ICD-10-CM | POA: Diagnosis not present

## 2020-02-12 DIAGNOSIS — R9439 Abnormal result of other cardiovascular function study: Secondary | ICD-10-CM | POA: Diagnosis present

## 2020-02-12 DIAGNOSIS — I5022 Chronic systolic (congestive) heart failure: Secondary | ICD-10-CM | POA: Diagnosis not present

## 2020-02-12 HISTORY — PX: LEFT HEART CATH AND CORONARY ANGIOGRAPHY: CATH118249

## 2020-02-12 LAB — BASIC METABOLIC PANEL
Anion gap: 14 (ref 5–15)
BUN: 11 mg/dL (ref 6–20)
CO2: 28 mmol/L (ref 22–32)
Calcium: 10.1 mg/dL (ref 8.9–10.3)
Chloride: 91 mmol/L — ABNORMAL LOW (ref 98–111)
Creatinine, Ser: 0.96 mg/dL (ref 0.61–1.24)
GFR, Estimated: >60 mL/min (ref >60)
Glucose, Bld: 243 mg/dL — ABNORMAL HIGH (ref 70–99)
Potassium: 2.9 mmol/L — ABNORMAL LOW (ref 3.5–5.1)
Sodium: 133 mmol/L — ABNORMAL LOW (ref 135–145)

## 2020-02-12 LAB — GLUCOSE, CAPILLARY
Glucose-Capillary: 223 mg/dL — ABNORMAL HIGH (ref 70–99)
Glucose-Capillary: 197 mg/dL — ABNORMAL HIGH (ref 70–99)

## 2020-02-12 SURGERY — LEFT HEART CATH AND CORONARY ANGIOGRAPHY
Anesthesia: LOCAL

## 2020-02-12 MED ORDER — MIDAZOLAM HCL 2 MG/2ML IJ SOLN
INTRAMUSCULAR | Status: DC | PRN
  Administered 2020-02-12: 1 mg via INTRAVENOUS

## 2020-02-12 MED ORDER — ACETAMINOPHEN 325 MG PO TABS: 650.0000 mg | ORAL_TABLET | ORAL | Status: DC | PRN

## 2020-02-12 MED ORDER — LIDOCAINE HCL (PF) 1 % IJ SOLN
INTRAMUSCULAR | Status: DC | PRN
  Administered 2020-02-12: 2 mL

## 2020-02-12 MED ORDER — MIDAZOLAM HCL 2 MG/2ML IJ SOLN
INTRAMUSCULAR | Status: AC
  Filled 2020-02-12: qty 2

## 2020-02-12 MED ORDER — SODIUM CHLORIDE 0.9% FLUSH: 3.0000 mL | Freq: Two times a day (BID) | INTRAVENOUS | Status: DC

## 2020-02-12 MED ORDER — IOHEXOL 350 MG/ML SOLN
INTRAVENOUS | Status: DC | PRN
  Administered 2020-02-12: 08:00:00 85 mL

## 2020-02-12 MED ORDER — SODIUM CHLORIDE 0.9 % IV SOLN: 250.0000 mL | INTRAVENOUS | Status: DC | PRN

## 2020-02-12 MED ORDER — LABETALOL HCL 5 MG/ML IV SOLN: 10.0000 mg | INTRAVENOUS | Status: DC | PRN

## 2020-02-12 MED ORDER — VERAPAMIL HCL 2.5 MG/ML IV SOLN
INTRAVENOUS | Status: AC
  Filled 2020-02-12: qty 2

## 2020-02-12 MED ORDER — HEPARIN (PORCINE) IN NACL 1000-0.9 UT/500ML-% IV SOLN
INTRAVENOUS | Status: DC | PRN
  Administered 2020-02-12 (×2): 500 mL

## 2020-02-12 MED ORDER — FENTANYL CITRATE (PF) 100 MCG/2ML IJ SOLN
INTRAMUSCULAR | Status: DC | PRN
  Administered 2020-02-12: 25 ug via INTRAVENOUS

## 2020-02-12 MED ORDER — ONDANSETRON HCL 4 MG/2ML IJ SOLN: 4.0000 mg | Freq: Four times a day (QID) | INTRAMUSCULAR | Status: DC | PRN

## 2020-02-12 MED ORDER — SODIUM CHLORIDE 0.9% FLUSH: 3.0000 mL | INTRAVENOUS | Status: DC | PRN

## 2020-02-12 MED ORDER — HYDRALAZINE HCL 20 MG/ML IJ SOLN: 10.0000 mg | INTRAMUSCULAR | Status: DC | PRN

## 2020-02-12 MED ORDER — VERAPAMIL HCL 2.5 MG/ML IV SOLN
INTRAVENOUS | Status: DC | PRN
  Administered 2020-02-12: 08:00:00 10 mL via INTRA_ARTERIAL

## 2020-02-12 MED ORDER — ISOSORBIDE MONONITRATE ER 30 MG PO TB24: 30.0000 mg | ORAL_TABLET | Freq: Every day | ORAL | 11 refills | Status: DC

## 2020-02-12 MED ORDER — SODIUM CHLORIDE 0.9 % WEIGHT BASED INFUSION: 1.0000 mL/kg/h | INTRAVENOUS | Status: DC

## 2020-02-12 MED ORDER — LIDOCAINE HCL (PF) 1 % IJ SOLN
INTRAMUSCULAR | Status: AC
  Filled 2020-02-12: qty 30

## 2020-02-12 MED ORDER — ASPIRIN 81 MG PO CHEW
81.0000 mg | CHEWABLE_TABLET | ORAL | Status: AC
  Administered 2020-02-12: 07:00:00 81 mg via ORAL
  Filled 2020-02-12: qty 1

## 2020-02-12 MED ORDER — HEPARIN (PORCINE) IN NACL 1000-0.9 UT/500ML-% IV SOLN
INTRAVENOUS | Status: AC
  Filled 2020-02-12: qty 1000

## 2020-02-12 MED ORDER — HEPARIN SODIUM (PORCINE) 1000 UNIT/ML IJ SOLN
INTRAMUSCULAR | Status: AC
  Filled 2020-02-12: qty 1

## 2020-02-12 MED ORDER — FENTANYL CITRATE (PF) 100 MCG/2ML IJ SOLN
INTRAMUSCULAR | Status: AC
  Filled 2020-02-12: qty 2

## 2020-02-12 MED ORDER — SODIUM CHLORIDE 0.9 % IV SOLN: INTRAVENOUS | Status: DC

## 2020-02-12 SURGICAL SUPPLY — 10 items
CATH 5FR JL3.5 JR4 ANG PIG MP (CATHETERS) ×2 IMPLANT
DEVICE RAD COMP TR BAND LRG (VASCULAR PRODUCTS) ×2 IMPLANT
GLIDESHEATH SLEND SS 6F .021 (SHEATH) ×2 IMPLANT
GUIDEWIRE INQWIRE 1.5J.035X260 (WIRE) ×1 IMPLANT
INQWIRE 1.5J .035X260CM (WIRE) ×2
KIT HEART LEFT (KITS) ×2 IMPLANT
PACK CARDIAC CATHETERIZATION (CUSTOM PROCEDURE TRAY) ×2 IMPLANT
SYR MEDRAD MARK 7 150ML (SYRINGE) ×2 IMPLANT
TRANSDUCER W/STOPCOCK (MISCELLANEOUS) ×2 IMPLANT
TUBING CIL FLEX 10 FLL-RA (TUBING) ×2 IMPLANT

## 2020-02-12 NOTE — Progress Notes (Signed)
HX of stroke/ residual weakness

## 2020-02-12 NOTE — Discharge Instructions (Signed)
Radial Site Care  This sheet gives you information about how to care for yourself after your procedure. Your health care provider may also give you more specific instructions. If you have problems or questions, contact your health care provider. What can I expect after the procedure? After the procedure, it is common to have:  Bruising and tenderness at the catheter insertion area. Follow these instructions at home: Medicines  Take over-the-counter and prescription medicines only as told by your health care provider. Insertion site care  Follow instructions from your health care provider about how to take care of your insertion site. Make sure you: ? Wash your hands with soap and water before you change your bandage (dressing). If soap and water are not available, use hand sanitizer. ? Change your dressing as told by your health care provider. ? Leave stitches (sutures), skin glue, or adhesive strips in place. These skin closures may need to stay in place for 2 weeks or longer. If adhesive strip edges start to loosen and curl up, you may trim the loose edges. Do not remove adhesive strips completely unless your health care provider tells you to do that.  Check your insertion site every day for signs of infection. Check for: ? Redness, swelling, or pain. ? Fluid or blood. ? Pus or a bad smell. ? Warmth.  Do not take baths, swim, or use a hot tub until your health care provider approves.  You may shower 24-48 hours after the procedure, or as directed by your health care provider. ? Remove the dressing and gently wash the site with plain soap and water. ? Pat the area dry with a clean towel. ? Do not rub the site. That could cause bleeding.  Do not apply powder or lotion to the site. Activity   For 24 hours after the procedure, or as directed by your health care provider: ? Do not flex or bend the affected arm. ? Do not push or pull heavy objects with the affected arm. ? Do not  drive yourself home from the hospital or clinic. You may drive 24 hours after the procedure unless your health care provider tells you not to. ? Do not operate machinery or power tools.  Do not lift anything that is heavier than 10 lb (4.5 kg), or the limit that you are told, until your health care provider says that it is safe.  Ask your health care provider when it is okay to: ? Return to work or school. ? Resume usual physical activities or sports. ? Resume sexual activity. General instructions  If the catheter site starts to bleed, raise your arm and put firm pressure on the site. If the bleeding does not stop, get help right away. This is a medical emergency.  If you went home on the same day as your procedure, a responsible adult should be with you for the first 24 hours after you arrive home.  Keep all follow-up visits as told by your health care provider. This is important. Contact a health care provider if:  You have a fever.  You have redness, swelling, or yellow drainage around your insertion site. Get help right away if:  You have unusual pain at the radial site.  The catheter insertion area swells very fast.  The insertion area is bleeding, and the bleeding does not stop when you hold steady pressure on the area.  Your arm or hand becomes pale, cool, tingly, or numb. These symptoms may represent a serious problem   that is an emergency. Do not wait to see if the symptoms will go away. Get medical help right away. Call your local emergency services (911 in the [place] Do not drive yourself to the hospital. Summary  After the procedure, it is common to have bruising and tenderness at the site.  Follow instructions from your health care provider about how to take care of your radial site wound. Check the wound every day for signs of infection.  Do not lift anything that is heavier than 10 lb (4.5 kg), or the limit that you are told, until your health care provider says  that it is safe. This information is not intended to replace advice given to you by your health care provider. Make sure you discuss any questions you have with your health care provider. Document Revised: 03/17/2017 Document Reviewed: 03/17/2017 Elsevier Patient Education  2020 [employer]  

## 2020-02-12 NOTE — Interval H&P Note (Signed)
Cath Lab Visit (complete for each Cath Lab visit)  Clinical Evaluation Leading to the Procedure:   ACS: No.  Non-ACS:    Anginal Classification: CCS III  Anti-ischemic medical therapy: Minimal Therapy (1 class of medications)  Non-Invasive Test Results: Intermediate-risk stress test findings: cardiac mortality 1-3%/year  Prior CABG: No previous CABG      History and Physical Interval Note:  02/12/2020 7:37 AM  [name]  has presented today for surgery, with the diagnosis of abnormal stress test.  The various methods of treatment have been discussed with the patient and family. After consideration of risks, benefits and other options for treatment, the patient has consented to  Procedure(s): LEFT HEART CATH AND CORONARY ANGIOGRAPHY (N/A) as a surgical intervention.  The patient's history has been reviewed, patient examined, no change in status, stable for surgery.  I have reviewed the patient's chart and labs.  Questions were answered to the patient's satisfaction.     [name]

## 2020-02-13 ENCOUNTER — Encounter: Payer: Medicaid Other | Admitting: Thoracic Surgery (Cardiothoracic Vascular Surgery)

## 2020-02-13 MED FILL — Heparin Sodium (Porcine) Inj 1000 Unit/ML: INTRAMUSCULAR | Qty: 10 | Status: AC

## 2020-02-14 ENCOUNTER — Other Ambulatory Visit: Payer: Self-pay

## 2020-02-14 ENCOUNTER — Institutional Professional Consult (permissible substitution): Payer: Medicaid Other | Admitting: Thoracic Surgery (Cardiothoracic Vascular Surgery)

## 2020-02-14 ENCOUNTER — Encounter: Payer: Self-pay | Admitting: Thoracic Surgery (Cardiothoracic Vascular Surgery)

## 2020-02-14 VITALS — BP 105/73 | HR 120 | Temp 97.7°F | Resp 20 | Ht 74.0 in | Wt 230.0 lb

## 2020-02-14 DIAGNOSIS — I2510 Atherosclerotic heart disease of native coronary artery without angina pectoris: Secondary | ICD-10-CM | POA: Diagnosis not present

## 2020-02-14 NOTE — Progress Notes (Signed)
[address]       [place] [address]             [phone]        Micheal Bell [facility] Medical Record #[id number] Date of Birth: 1971/01/19  Referring: [name], MD Primary Care: [name], MD Primary Cardiologist:No primary care provider on file.  Chief Complaint:    Chief Complaint  Patient presents with  . Coronary Artery Disease    Surgical consult, Cardiac Cath  02/12/20    History of Present Illness:     [name] is a 49 year old gentleman that is referred by Dr. Excell Seltzer for surgical evaluation and three-vessel coronary disease.  In September of this year, [name] was treated for Covid pneumonia, and since that point he has had exertional dyspnea as well as some chest tenderness.  He is also become quite deconditioned since his hospital stay.  A normal day for him includes mostly just laying in bed, and he develops early fatigue and short distances with ambulation.  He currently uses a rolling walker and today in clinic had to sit down while making his way from the waiting area back to the exam room.  He does endorse some chest tenderness along the left side at rest and with ambulation.  He states that he is doing much better since being placed on new medications.   Past Medical History:  Diagnosis Date  . Arthritis   . Asthma   . Diabetes mellitus without complication (HCC)   . DVT (deep venous thrombosis) (HCC)   . Fibromyalgia   . Gout   . Hypertension   . IBS (irritable bowel syndrome)   . Vein disorder     Past Surgical History:  Procedure Laterality Date  . FOOT SURGERY    . LEFT HEART CATH AND CORONARY ANGIOGRAPHY N/A 02/12/2020   Procedure: LEFT HEART CATH AND CORONARY ANGIOGRAPHY;  Surgeon: [name], MD;  Location: [facility] INVASIVE CV LAB;  Service: Cardiovascular;  Laterality: N/A;  . TEE WITHOUT CARDIOVERSION N/A 10/24/2019   Procedure: TRANSESOPHAGEAL ECHOCARDIOGRAM (TEE);  Surgeon: [name], MD;   Location: [facility] ENDOSCOPY;  Service: Cardiovascular;  Laterality: N/A;    Social History: Support: He lives with his mother and stepfather.  His mother recently fell and had a hip replacement.  Social History   Tobacco Use  Smoking Status Never Smoker  Smokeless Tobacco Never Used    Social History   Substance and Sexual Activity  Alcohol Use Never     Allergies  Allergen Reactions  . Azithromycin     Blisters in mouth   . Lodine [Etodolac] Anaphylaxis     Current Outpatient Medications  Medication Sig Dispense Refill  . albuterol (PROVENTIL HFA;VENTOLIN HFA) 108 (90 Base) MCG/ACT inhaler Inhale into the lungs every 6 (six) hours as needed for wheezing or shortness of breath.    Marland Kitchen albuterol (PROVENTIL) (2.5 MG/3ML) 0.083% nebulizer solution Take 2.5 mg by nebulization every 6 (six) hours as needed for wheezing or shortness of breath.    . allopurinol (ZYLOPRIM) 300 MG tablet Take 300 mg by mouth daily.    Marland Kitchen aspirin EC 81 MG tablet Take 1 tablet (81 mg total) by mouth daily. Swallow whole. 30 tablet 11  . buPROPion (WELLBUTRIN XL) 150 MG 24 hr tablet Take 150 mg by mouth every morning.    . carvedilol (COREG) 3.125 MG tablet Take 1 tablet (3.125 mg total) by mouth 2 (  two) times daily. 180 tablet 3  . chlorthalidone (HYGROTON) 25 MG tablet Take 25 mg by mouth every evening.    . cyclobenzaprine (FLEXERIL) 10 MG tablet Take 0.5 tablets (5 mg total) by mouth 2 (two) times daily as needed for muscle spasms. (Patient taking differently: Take 10 mg by mouth 3 (three) times daily.) 15 tablet 0  . fluticasone (FLONASE) 50 MCG/ACT nasal spray Place 1 spray into both nostrils daily.    . furosemide (LASIX) 20 MG tablet Take 20 mg by mouth daily.    Marland Kitchen gabapentin (NEURONTIN) 300 MG capsule Take 300 mg by mouth 3 (three) times daily.    Marland Kitchen HYDROcodone-acetaminophen (NORCO/VICODIN) 5-325 MG tablet Take 1 tablet by mouth every 6 (six) hours as needed for moderate pain.    Marland Kitchen insulin glargine  (LANTUS) 100 UNIT/ML Solostar Pen Inject 20 Units into the skin daily. (Patient taking differently: Inject 20 Units into the skin every evening.) 15 mL 0  . isosorbide mononitrate (IMDUR) 30 MG 24 hr tablet Take 1 tablet (30 mg total) by mouth daily. 30 tablet 11  . levETIRAcetam (KEPPRA) 500 MG tablet Take 1 tablet (500 mg total) by mouth 2 (two) times daily. 60 tablet 0  . losartan (COZAAR) 25 MG tablet Take 1/2 tablet ( 12.5 mg ) daily (Patient taking differently: Take 12.5 mg by mouth in the morning and at bedtime.) 45 tablet 3  . mometasone-formoterol (DULERA) 200-5 MCG/ACT AERO Inhale 2 puffs into the lungs 2 (two) times daily.    . montelukast (SINGULAIR) 10 MG tablet Take 10 mg by mouth at bedtime.    . nitroGLYCERIN (NITRODUR - DOSED IN MG/24 HR) 0.2 mg/hr patch Place 0.2 mg onto the skin daily as needed (shoulder pains).    . nitroGLYCERIN (NITROSTAT) 0.4 MG SL tablet Place 1 tablet (0.4 mg total) under the tongue every 5 (five) minutes as needed for chest pain. 25 tablet 11  . nortriptyline (PAMELOR) 25 MG capsule Take 25 mg by mouth at bedtime.    . pantoprazole (PROTONIX) 40 MG tablet Take 40 mg by mouth daily.    Marland Kitchen PENTIPS 32G X 4 MM MISC See admin instructions. with insulin    . Potassium Chloride ER 20 MEQ TBCR Take 20 mEq by mouth daily. 90 tablet 3  . triamcinolone (KENALOG) 0.1 % Apply 1 application topically 2 (two) times daily as needed (irritation).    Marland Kitchen zolpidem (AMBIEN) 10 MG tablet Take 10 mg by mouth at bedtime.     No current facility-administered medications for this visit.   Facility-Administered Medications Ordered in Other Visits  Medication Dose Route Frequency Provider Last Rate Last Admin  . regadenoson (LEXISCAN) injection SOLN 0.4 mg  0.4 mg Intravenous Once O'Neal, Ronnald Ramp, MD        (Not in a hospital admission)   Family History  Problem Relation Age of Onset  . Diabetes Mother   . Heart disease Mother   . Heart disease Father      Review  of Systems:   Review of Systems  Constitutional: Positive for malaise/fatigue and weight loss.  Respiratory: Positive for shortness of breath.   Cardiovascular: Positive for chest pain.  Musculoskeletal: Positive for back pain, joint pain and myalgias.      Physical Exam: BP 105/73   Pulse (!) 120   Temp 97.7 F (36.5 C) (Skin)   Resp 20   Ht 6\' 2"  (1.88 m)   Wt 230 lb (104.3 kg)   SpO2 98%  Comment: RA  BMI 29.53 kg/m  Physical Exam Constitutional:      General: He is not in acute distress.    Appearance: He is obese. He is ill-appearing.     Comments: Deconditioned Presents with a rolling walker.  HENT:     Head: Normocephalic and atraumatic.  Eyes:     Extraocular Movements: Extraocular movements intact.  Cardiovascular:     Rate and Rhythm: Tachycardia present.  Pulmonary:     Effort: Pulmonary effort is normal. No respiratory distress.  Musculoskeletal:        General: Normal range of motion.  Skin:    General: Skin is warm and dry.  Neurological:     General: No focal deficit present.     Mental Status: He is alert and oriented to person, place, and time.       Diagnostic Studies & Laboratory data:    Left Heart Catherization: 1.  Widely patent left main stent with no stenosis 2.  Patent RCA with mild nonobstructive disease, but severe stenosis of the PDA is present 3.  Severe diffuse stenosis of the left circumflex extending into an obtuse marginal branch (long lesion) 4.  Severe diffuse mid LAD stenosis and moderate distal LAD stenosis 5.  Moderate segmental LV systolic dysfunction with normal LVEDP     I have independently reviewed the above radiologic studies and discussed with the patient   Recent Lab Findings: Lab Results  Component Value Date   WBC 12.2 (H) 01/23/2020   HGB 14.8 01/23/2020   HCT 43.4 01/23/2020   PLT 418 01/23/2020   GLUCOSE 243 (H) 02/12/2020   CHOL 168 10/26/2019   TRIG 130 10/26/2019   HDL 43 10/26/2019   LDLCALC 99  10/26/2019   ALT 18 11/08/2019   AST 20 11/08/2019   NA 133 (L) 02/12/2020   K 2.9 (L) 02/12/2020   CL 91 (L) 02/12/2020   CREATININE 0.96 02/12/2020   BUN 11 02/12/2020   CO2 28 02/12/2020   TSH 4.137 10/26/2019   INR 1.2 10/25/2019   HGBA1C 11.3 (H) 10/26/2019      Assessment / Plan:   49 year old male with three-vessel coronary artery disease.  He is also diabetic please not been very compliant with his medications.  His hemoglobin A1c in September was 11.3 and he has not had good follow-up for management of this.  In September he was also treated for a severe case of Covid pneumonia and remains quite deconditioned from this.  Under normal circumstances he would be a surgical candidate based off of his anatomy but given his functional status and deconditioning I do not think that he would recover well from open heart surgery.  He spends most of his day in the bed and lives with his mother who recently broke her hip and an elderly stepfather thus after surgery he undoubtedly would need to be placed in a nursing home for further therapy.  His case was discussed in detail with Dr. Excell Seltzer and given his anatomy he does have landing zones for percutaneous interventions in the LAD and circumflex territories.   Again I think that the main prohibitive factor for surgical revascularization is his current functional status, and I think that he will be best served with percutaneous intervention.    I  spent 40 minutes counseling the patient face to face.   Corliss Skains 02/14/2020 5:17 PM

## 2020-02-14 NOTE — H&P (View-Only) (Signed)
[address]       [place] [address]             [phone]        GRAYLON AMORY [facility] Medical Record #[id number] Date of Birth: 1971/01/19  Referring: [name], MD Primary Care: [name], MD Primary Cardiologist:No primary care provider on file.  Chief Complaint:    Chief Complaint  Patient presents with  . Coronary Artery Disease    Surgical consult, Cardiac Cath  02/12/20    History of Present Illness:     [name] is a 49 year old gentleman that is referred by Dr. Excell Seltzer for surgical evaluation and three-vessel coronary disease.  In September of this year, [name] was treated for Covid pneumonia, and since that point he has had exertional dyspnea as well as some chest tenderness.  He is also become quite deconditioned since his hospital stay.  A normal day for him includes mostly just laying in bed, and he develops early fatigue and short distances with ambulation.  He currently uses a rolling walker and today in clinic had to sit down while making his way from the waiting area back to the exam room.  He does endorse some chest tenderness along the left side at rest and with ambulation.  He states that he is doing much better since being placed on new medications.   Past Medical History:  Diagnosis Date  . Arthritis   . Asthma   . Diabetes mellitus without complication (HCC)   . DVT (deep venous thrombosis) (HCC)   . Fibromyalgia   . Gout   . Hypertension   . IBS (irritable bowel syndrome)   . Vein disorder     Past Surgical History:  Procedure Laterality Date  . FOOT SURGERY    . LEFT HEART CATH AND CORONARY ANGIOGRAPHY N/A 02/12/2020   Procedure: LEFT HEART CATH AND CORONARY ANGIOGRAPHY;  Surgeon: [name], MD;  Location: [facility] INVASIVE CV LAB;  Service: Cardiovascular;  Laterality: N/A;  . TEE WITHOUT CARDIOVERSION N/A 10/24/2019   Procedure: TRANSESOPHAGEAL ECHOCARDIOGRAM (TEE);  Surgeon: [name], MD;   Location: [facility] ENDOSCOPY;  Service: Cardiovascular;  Laterality: N/A;    Social History: Support: He lives with his mother and stepfather.  His mother recently fell and had a hip replacement.  Social History   Tobacco Use  Smoking Status Never Smoker  Smokeless Tobacco Never Used    Social History   Substance and Sexual Activity  Alcohol Use Never     Allergies  Allergen Reactions  . Azithromycin     Blisters in mouth   . Lodine [Etodolac] Anaphylaxis     Current Outpatient Medications  Medication Sig Dispense Refill  . albuterol (PROVENTIL HFA;VENTOLIN HFA) 108 (90 Base) MCG/ACT inhaler Inhale into the lungs every 6 (six) hours as needed for wheezing or shortness of breath.    Marland Kitchen albuterol (PROVENTIL) (2.5 MG/3ML) 0.083% nebulizer solution Take 2.5 mg by nebulization every 6 (six) hours as needed for wheezing or shortness of breath.    . allopurinol (ZYLOPRIM) 300 MG tablet Take 300 mg by mouth daily.    Marland Kitchen aspirin EC 81 MG tablet Take 1 tablet (81 mg total) by mouth daily. Swallow whole. 30 tablet 11  . buPROPion (WELLBUTRIN XL) 150 MG 24 hr tablet Take 150 mg by mouth every morning.    . carvedilol (COREG) 3.125 MG tablet Take 1 tablet (3.125 mg total) by mouth 2 (  two) times daily. 180 tablet 3  . chlorthalidone (HYGROTON) 25 MG tablet Take 25 mg by mouth every evening.    . cyclobenzaprine (FLEXERIL) 10 MG tablet Take 0.5 tablets (5 mg total) by mouth 2 (two) times daily as needed for muscle spasms. (Patient taking differently: Take 10 mg by mouth 3 (three) times daily.) 15 tablet 0  . fluticasone (FLONASE) 50 MCG/ACT nasal spray Place 1 spray into both nostrils daily.    . furosemide (LASIX) 20 MG tablet Take 20 mg by mouth daily.    Marland Kitchen gabapentin (NEURONTIN) 300 MG capsule Take 300 mg by mouth 3 (three) times daily.    Marland Kitchen HYDROcodone-acetaminophen (NORCO/VICODIN) 5-325 MG tablet Take 1 tablet by mouth every 6 (six) hours as needed for moderate pain.    Marland Kitchen insulin glargine  (LANTUS) 100 UNIT/ML Solostar Pen Inject 20 Units into the skin daily. (Patient taking differently: Inject 20 Units into the skin every evening.) 15 mL 0  . isosorbide mononitrate (IMDUR) 30 MG 24 hr tablet Take 1 tablet (30 mg total) by mouth daily. 30 tablet 11  . levETIRAcetam (KEPPRA) 500 MG tablet Take 1 tablet (500 mg total) by mouth 2 (two) times daily. 60 tablet 0  . losartan (COZAAR) 25 MG tablet Take 1/2 tablet ( 12.5 mg ) daily (Patient taking differently: Take 12.5 mg by mouth in the morning and at bedtime.) 45 tablet 3  . mometasone-formoterol (DULERA) 200-5 MCG/ACT AERO Inhale 2 puffs into the lungs 2 (two) times daily.    . montelukast (SINGULAIR) 10 MG tablet Take 10 mg by mouth at bedtime.    . nitroGLYCERIN (NITRODUR - DOSED IN MG/24 HR) 0.2 mg/hr patch Place 0.2 mg onto the skin daily as needed (shoulder pains).    . nitroGLYCERIN (NITROSTAT) 0.4 MG SL tablet Place 1 tablet (0.4 mg total) under the tongue every 5 (five) minutes as needed for chest pain. 25 tablet 11  . nortriptyline (PAMELOR) 25 MG capsule Take 25 mg by mouth at bedtime.    . pantoprazole (PROTONIX) 40 MG tablet Take 40 mg by mouth daily.    Marland Kitchen PENTIPS 32G X 4 MM MISC See admin instructions. with insulin    . Potassium Chloride ER 20 MEQ TBCR Take 20 mEq by mouth daily. 90 tablet 3  . triamcinolone (KENALOG) 0.1 % Apply 1 application topically 2 (two) times daily as needed (irritation).    Marland Kitchen zolpidem (AMBIEN) 10 MG tablet Take 10 mg by mouth at bedtime.     No current facility-administered medications for this visit.   Facility-Administered Medications Ordered in Other Visits  Medication Dose Route Frequency Provider Last Rate Last Admin  . regadenoson (LEXISCAN) injection SOLN 0.4 mg  0.4 mg Intravenous Once O'Neal, Ronnald Ramp, MD        (Not in a hospital admission)   Family History  Problem Relation Age of Onset  . Diabetes Mother   . Heart disease Mother   . Heart disease Father      Review  of Systems:   Review of Systems  Constitutional: Positive for malaise/fatigue and weight loss.  Respiratory: Positive for shortness of breath.   Cardiovascular: Positive for chest pain.  Musculoskeletal: Positive for back pain, joint pain and myalgias.      Physical Exam: BP 105/73   Pulse (!) 120   Temp 97.7 F (36.5 C) (Skin)   Resp 20   Ht 6\' 2"  (1.88 m)   Wt 230 lb (104.3 kg)   SpO2 98%  Comment: RA  BMI 29.53 kg/m  Physical Exam Constitutional:      General: He is not in acute distress.    Appearance: He is obese. He is ill-appearing.     Comments: Deconditioned Presents with a rolling walker.  HENT:     Head: Normocephalic and atraumatic.  Eyes:     Extraocular Movements: Extraocular movements intact.  Cardiovascular:     Rate and Rhythm: Tachycardia present.  Pulmonary:     Effort: Pulmonary effort is normal. No respiratory distress.  Musculoskeletal:        General: Normal range of motion.  Skin:    General: Skin is warm and dry.  Neurological:     General: No focal deficit present.     Mental Status: He is alert and oriented to person, place, and time.       Diagnostic Studies & Laboratory data:    Left Heart Catherization: 1.  Widely patent left main stent with no stenosis 2.  Patent RCA with mild nonobstructive disease, but severe stenosis of the PDA is present 3.  Severe diffuse stenosis of the left circumflex extending into an obtuse marginal branch (long lesion) 4.  Severe diffuse mid LAD stenosis and moderate distal LAD stenosis 5.  Moderate segmental LV systolic dysfunction with normal LVEDP     I have independently reviewed the above radiologic studies and discussed with the patient   Recent Lab Findings: Lab Results  Component Value Date   WBC 12.2 (H) 01/23/2020   HGB 14.8 01/23/2020   HCT 43.4 01/23/2020   PLT 418 01/23/2020   GLUCOSE 243 (H) 02/12/2020   CHOL 168 10/26/2019   TRIG 130 10/26/2019   HDL 43 10/26/2019   LDLCALC 99  10/26/2019   ALT 18 11/08/2019   AST 20 11/08/2019   NA 133 (L) 02/12/2020   K 2.9 (L) 02/12/2020   CL 91 (L) 02/12/2020   CREATININE 0.96 02/12/2020   BUN 11 02/12/2020   CO2 28 02/12/2020   TSH 4.137 10/26/2019   INR 1.2 10/25/2019   HGBA1C 11.3 (H) 10/26/2019      Assessment / Plan:   49 year old male with three-vessel coronary artery disease.  He is also diabetic please not been very compliant with his medications.  His hemoglobin A1c in September was 11.3 and he has not had good follow-up for management of this.  In September he was also treated for a severe case of Covid pneumonia and remains quite deconditioned from this.  Under normal circumstances he would be a surgical candidate based off of his anatomy but given his functional status and deconditioning I do not think that he would recover well from open heart surgery.  He spends most of his day in the bed and lives with his mother who recently broke her hip and an elderly stepfather thus after surgery he undoubtedly would need to be placed in a nursing home for further therapy.  His case was discussed in detail with Dr. Excell Seltzer and given his anatomy he does have landing zones for percutaneous interventions in the LAD and circumflex territories.   Again I think that the main prohibitive factor for surgical revascularization is his current functional status, and I think that he will be best served with percutaneous intervention.    I  spent 40 minutes counseling the patient face to face.   Corliss Skains 02/14/2020 5:17 PM

## 2020-02-15 ENCOUNTER — Other Ambulatory Visit: Payer: Self-pay | Admitting: *Deleted

## 2020-02-15 ENCOUNTER — Telehealth: Payer: Self-pay | Admitting: *Deleted

## 2020-02-15 ENCOUNTER — Telehealth: Payer: Self-pay

## 2020-02-15 ENCOUNTER — Other Ambulatory Visit (HOSPITAL_COMMUNITY)
Admission: RE | Admit: 2020-02-15 | Discharge: 2020-02-15 | Disposition: A | Discharge status: Home / Self Care | Payer: Medicaid Other | Source: Ambulatory Visit | Attending: Cardiology | Admitting: Cardiology

## 2020-02-15 DIAGNOSIS — I2510 Atherosclerotic heart disease of native coronary artery without angina pectoris: Secondary | ICD-10-CM

## 2020-02-15 DIAGNOSIS — Z20822 Contact with and (suspected) exposure to covid-19: Secondary | ICD-10-CM | POA: Insufficient documentation

## 2020-02-15 DIAGNOSIS — Z01812 Encounter for preprocedural laboratory examination: Secondary | ICD-10-CM

## 2020-02-15 LAB — SARS CORONAVIRUS 2 (TAT 6-24 HRS): SARS Coronavirus 2: NEGATIVE

## 2020-02-15 LAB — BASIC METABOLIC PANEL
BUN/Creatinine Ratio: 14 (ref 9–20)
BUN: 12 mg/dL (ref 6–24)
CO2: 31 mmol/L — ABNORMAL HIGH (ref 20–29)
Calcium: 10.2 mg/dL (ref 8.7–10.2)
Chloride: 92 mmol/L — ABNORMAL LOW (ref 96–106)
Creatinine, Ser: 0.84 mg/dL (ref 0.76–1.27)
GFR calc Af Amer: 117 mL/min/{1.73_m2} (ref >59)
GFR calc non Af Amer: 101 mL/min/{1.73_m2} (ref >59)
Glucose: 222 mg/dL — ABNORMAL HIGH (ref 65–99)
Potassium: 3.5 mmol/L (ref 3.5–5.2)
Sodium: 136 mmol/L (ref 134–144)

## 2020-02-15 LAB — CBC
Hematocrit: 38.8 % (ref 37.5–51.0)
Hemoglobin: 13.3 g/dL (ref 13.0–17.7)
MCH: 29.6 pg (ref 26.6–33.0)
MCHC: 34.3 g/dL (ref 31.5–35.7)
MCV: 86 fL (ref 79–97)
Platelets: 363 10*3/uL (ref 150–450)
RBC: 4.49 x10E6/uL (ref 4.14–5.80)
RDW: 14.8 % (ref 11.6–15.4)
WBC: 9.6 10*3/uL (ref 3.4–10.8)

## 2020-02-15 MED ORDER — CLOPIDOGREL BISULFATE 75 MG PO TABS: 75.0000 mg | ORAL_TABLET | Freq: Every day | ORAL | 3 refills | Status: DC

## 2020-02-15 NOTE — Telephone Encounter (Signed)
Pt scheduled for Coronary Stent Intervention 02/20/20, going to [facility] for STAT BMP/CBC this morning.

## 2020-02-15 NOTE — Telephone Encounter (Signed)
Pt contacted pre-coronary stent intervention scheduled at [facility] for: Tuesday February 20, 2020 9 AM Verified arrival time and place: [facility] Main Entrance [facility]) at: 7 AM   No solid food after midnight prior to cath, clear liquids until 5 AM day of procedure.  Hold: Chlorthalidone -AM of procedure Lasix-AM of procedure Insulin-1/2 usual HS dose PM prior to procedure Pt reports he does not take AM Insulin.  Except hold medications AM meds can be  taken pre-cath with sips of water including: ASA 81 mg Plavix 75 mg   Confirmed patient has responsible adult to drive home post procedure and be with patient first 24 hours after arriving home: yes  You are allowed ONE visitor in the waiting room during the time you are at the hospital for your procedure. Both you and your visitor must wear a mask once you enter the hospital.       COVID-19 Pre-Screening Questions:   In the past 14 days have you had any symptoms concerning for COVID-19 infection (fever, chills, cough, or new shortness of breath)? no  In the past 14 days have you been around anyone with known Covid 19? no              Reviewed procedure/mask/visitor instructions, COVID-19 questions with patient.             BMP/CBC done today-K 3.5-per Dr O'Neal-continue potassium supplement with lasix, nothing else needed             Pt knows to continue his current dose KCl 20 mEq daily.             Pt knows he needs to quarantine after pre-procedure COVID-19 test today until he goes to hospital for procedure Tuesday.

## 2020-02-15 NOTE — Telephone Encounter (Signed)
-----   Message from Jolyn Nap, RN sent at 02/15/2020  8:09 AM EST ----- Regarding: Prescription for Plavix Florentina Addison,  I set Mr Sonneborn up for Tuesday 12/28 with Dr [name]. He is going to get Covid test today, he knows he has to quarantine.  Can you call his Plavix prescription into his pharmacy [facility] in [place]..  Thanks  Janne Napoleon  ----- Message ----- From: [name], MD Sent: 02/14/2020   5:56 PM EST To: Jolyn Nap, RN  Hey - this is a patient I cathed Monday. Saw Dr Cliffton Asters today, felt PCI best option. He wants to have PCI done before the end of the year. Can we get him set up for next week? Needs LAD and LCx PCI. Needs to be started on plavix 300 mg x 1 then 75 mg daily.  Thank you!

## 2020-02-15 NOTE — Progress Notes (Signed)
Called patient to schedule him for his PCI.  He is going to get his Covid test today, he knows he has to quarantine until Tuesday.  He will arrive on Tuesday at 6:30.  He will follow the same instructions he had for his heart cath on Monday.

## 2020-02-15 NOTE — Telephone Encounter (Signed)
Per Dr. Excell Seltzer, instructed the patient to START PLAVIX 75 mg daily. He understands to take 300 mg the first day only as loading dose. He was grateful for call and agrees with plan.

## 2020-02-20 ENCOUNTER — Encounter (HOSPITAL_COMMUNITY)
Admission: AD | Disposition: A | Discharge status: Home / Self Care | Payer: Self-pay | Source: Home / Self Care | Attending: Cardiology

## 2020-02-20 ENCOUNTER — Inpatient Hospital Stay (HOSPITAL_COMMUNITY)
Admission: AD | Admit: 2020-02-20 | Discharge: 2020-02-25 | DRG: 246 | Disposition: A | Discharge status: Home / Self Care | Payer: Medicaid Other | Attending: Cardiology | Admitting: Cardiology

## 2020-02-20 ENCOUNTER — Encounter (HOSPITAL_COMMUNITY): Payer: Self-pay | Admitting: Cardiology

## 2020-02-20 DIAGNOSIS — E66812 Obesity, class 2: Secondary | ICD-10-CM | POA: Diagnosis present

## 2020-02-20 DIAGNOSIS — I25119 Atherosclerotic heart disease of native coronary artery with unspecified angina pectoris: Principal | ICD-10-CM | POA: Diagnosis present

## 2020-02-20 DIAGNOSIS — E669 Obesity, unspecified: Secondary | ICD-10-CM | POA: Diagnosis present

## 2020-02-20 DIAGNOSIS — I5022 Chronic systolic (congestive) heart failure: Secondary | ICD-10-CM | POA: Diagnosis present

## 2020-02-20 DIAGNOSIS — I10 Essential (primary) hypertension: Secondary | ICD-10-CM | POA: Diagnosis present

## 2020-02-20 DIAGNOSIS — Z8249 Family history of ischemic heart disease and other diseases of the circulatory system: Secondary | ICD-10-CM

## 2020-02-20 DIAGNOSIS — Z86718 Personal history of other venous thrombosis and embolism: Secondary | ICD-10-CM

## 2020-02-20 DIAGNOSIS — Z7902 Long term (current) use of antithrombotics/antiplatelets: Secondary | ICD-10-CM

## 2020-02-20 DIAGNOSIS — I209 Angina pectoris, unspecified: Secondary | ICD-10-CM | POA: Diagnosis present

## 2020-02-20 DIAGNOSIS — I110 Hypertensive heart disease with heart failure: Secondary | ICD-10-CM | POA: Diagnosis present

## 2020-02-20 DIAGNOSIS — M797 Fibromyalgia: Secondary | ICD-10-CM | POA: Diagnosis present

## 2020-02-20 DIAGNOSIS — Z79899 Other long term (current) drug therapy: Secondary | ICD-10-CM

## 2020-02-20 DIAGNOSIS — I255 Ischemic cardiomyopathy: Secondary | ICD-10-CM | POA: Diagnosis present

## 2020-02-20 DIAGNOSIS — Z7951 Long term (current) use of inhaled steroids: Secondary | ICD-10-CM

## 2020-02-20 DIAGNOSIS — D72828 Other elevated white blood cell count: Secondary | ICD-10-CM | POA: Diagnosis present

## 2020-02-20 DIAGNOSIS — I471 Supraventricular tachycardia: Secondary | ICD-10-CM | POA: Diagnosis not present

## 2020-02-20 DIAGNOSIS — E876 Hypokalemia: Secondary | ICD-10-CM

## 2020-02-20 DIAGNOSIS — D6489 Other specified anemias: Secondary | ICD-10-CM | POA: Diagnosis present

## 2020-02-20 DIAGNOSIS — Z8616 Personal history of COVID-19: Secondary | ICD-10-CM

## 2020-02-20 DIAGNOSIS — E785 Hyperlipidemia, unspecified: Secondary | ICD-10-CM

## 2020-02-20 DIAGNOSIS — Z955 Presence of coronary angioplasty implant and graft: Secondary | ICD-10-CM

## 2020-02-20 DIAGNOSIS — Z7982 Long term (current) use of aspirin: Secondary | ICD-10-CM

## 2020-02-20 DIAGNOSIS — M109 Gout, unspecified: Secondary | ICD-10-CM | POA: Diagnosis present

## 2020-02-20 DIAGNOSIS — I2510 Atherosclerotic heart disease of native coronary artery without angina pectoris: Secondary | ICD-10-CM | POA: Diagnosis present

## 2020-02-20 DIAGNOSIS — I25118 Atherosclerotic heart disease of native coronary artery with other forms of angina pectoris: Secondary | ICD-10-CM | POA: Diagnosis present

## 2020-02-20 DIAGNOSIS — Z794 Long term (current) use of insulin: Secondary | ICD-10-CM

## 2020-02-20 DIAGNOSIS — R079 Chest pain, unspecified: Secondary | ICD-10-CM | POA: Diagnosis present

## 2020-02-20 DIAGNOSIS — E119 Type 2 diabetes mellitus without complications: Secondary | ICD-10-CM

## 2020-02-20 HISTORY — PX: CORONARY ATHERECTOMY: CATH118238

## 2020-02-20 HISTORY — PX: CORONARY ULTRASOUND/IVUS: CATH118244

## 2020-02-20 HISTORY — DX: Atherosclerotic heart disease of native coronary artery without angina pectoris: I25.10

## 2020-02-20 HISTORY — PX: CORONARY STENT INTERVENTION: CATH118234

## 2020-02-20 LAB — CREATININE, SERUM
Creatinine, Ser: 0.76 mg/dL (ref 0.61–1.24)
GFR, Estimated: >60 mL/min (ref >60)

## 2020-02-20 LAB — GLUCOSE, CAPILLARY
Glucose-Capillary: 200 mg/dL — ABNORMAL HIGH (ref 70–99)
Glucose-Capillary: 171 mg/dL — ABNORMAL HIGH (ref 70–99)

## 2020-02-20 LAB — CBC
HCT: 35.2 % — ABNORMAL LOW (ref 39.0–52.0)
Hemoglobin: 11.7 g/dL — ABNORMAL LOW (ref 13.0–17.0)
MCH: 28.7 pg (ref 26.0–34.0)
MCHC: 33.2 g/dL (ref 30.0–36.0)
MCV: 86.5 fL (ref 80.0–100.0)
Platelets: 316 10*3/uL (ref 150–400)
RBC: 4.07 MIL/uL — ABNORMAL LOW (ref 4.22–5.81)
RDW: 13.3 % (ref 11.5–15.5)
WBC: 8.3 10*3/uL (ref 4.0–10.5)
nRBC: 0 % (ref 0.0–0.2)

## 2020-02-20 LAB — POCT ACTIVATED CLOTTING TIME
Activated Clotting Time: 327 seconds
Activated Clotting Time: 356 seconds
Activated Clotting Time: 237 seconds
Activated Clotting Time: 339 seconds

## 2020-02-20 SURGERY — CORONARY STENT INTERVENTION
Anesthesia: LOCAL

## 2020-02-20 MED ORDER — CARVEDILOL 3.125 MG PO TABS
3.1250 mg | ORAL_TABLET | Freq: Two times a day (BID) | ORAL | Status: DC
  Administered 2020-02-20 – 2020-02-21 (×2): 3.125 mg via ORAL
  Filled 2020-02-20 (×2): qty 1

## 2020-02-20 MED ORDER — HEPARIN (PORCINE) IN NACL 1000-0.9 UT/500ML-% IV SOLN
INTRAVENOUS | Status: DC | PRN
  Administered 2020-02-20: 500 mL

## 2020-02-20 MED ORDER — SODIUM CHLORIDE 0.9 % IV SOLN: 250.0000 mL | INTRAVENOUS | Status: DC | PRN

## 2020-02-20 MED ORDER — NORTRIPTYLINE HCL 25 MG PO CAPS
25.0000 mg | ORAL_CAPSULE | Freq: Every day | ORAL | Status: DC
  Administered 2020-02-20 – 2020-02-24 (×5): 25 mg via ORAL
  Filled 2020-02-20 (×6): qty 1

## 2020-02-20 MED ORDER — CYCLOBENZAPRINE HCL 5 MG PO TABS
5.0000 mg | ORAL_TABLET | Freq: Two times a day (BID) | ORAL | Status: DC | PRN
  Administered 2020-02-20 – 2020-02-25 (×8): 5 mg via ORAL
  Filled 2020-02-20 (×11): qty 1

## 2020-02-20 MED ORDER — LOSARTAN POTASSIUM 25 MG PO TABS
12.5000 mg | ORAL_TABLET | Freq: Every day | ORAL | Status: DC
  Administered 2020-02-21 – 2020-02-25 (×5): 12.5 mg via ORAL
  Filled 2020-02-20 (×6): qty 1

## 2020-02-20 MED ORDER — BUPROPION HCL ER (XL) 150 MG PO TB24
150.0000 mg | ORAL_TABLET | Freq: Every morning | ORAL | Status: DC
  Administered 2020-02-21 – 2020-02-25 (×5): 150 mg via ORAL
  Filled 2020-02-20 (×5): qty 1

## 2020-02-20 MED ORDER — ZOLPIDEM TARTRATE 5 MG PO TABS
10.0000 mg | ORAL_TABLET | Freq: Every evening | ORAL | Status: DC | PRN
  Administered 2020-02-24: 10 mg via ORAL
  Filled 2020-02-20: qty 2

## 2020-02-20 MED ORDER — CLOPIDOGREL BISULFATE 75 MG PO TABS
75.0000 mg | ORAL_TABLET | Freq: Every day | ORAL | Status: DC
  Administered 2020-02-21 – 2020-02-25 (×5): 75 mg via ORAL
  Filled 2020-02-20 (×6): qty 1

## 2020-02-20 MED ORDER — LIDOCAINE HCL (PF) 1 % IJ SOLN
INTRAMUSCULAR | Status: AC
  Filled 2020-02-20: qty 30

## 2020-02-20 MED ORDER — MONTELUKAST SODIUM 10 MG PO TABS
10.0000 mg | ORAL_TABLET | Freq: Every day | ORAL | Status: DC
  Administered 2020-02-20 – 2020-02-24 (×5): 10 mg via ORAL
  Filled 2020-02-20 (×5): qty 1

## 2020-02-20 MED ORDER — MOMETASONE FURO-FORMOTEROL FUM 200-5 MCG/ACT IN AERO
2.0000 | INHALATION_SPRAY | Freq: Two times a day (BID) | RESPIRATORY_TRACT | Status: DC
  Administered 2020-02-20 – 2020-02-25 (×9): 2 via RESPIRATORY_TRACT
  Filled 2020-02-20: qty 8.8

## 2020-02-20 MED ORDER — LIDOCAINE HCL (PF) 1 % IJ SOLN
INTRAMUSCULAR | Status: DC | PRN
  Administered 2020-02-20: 3 mL

## 2020-02-20 MED ORDER — SODIUM CHLORIDE 0.9 % WEIGHT BASED INFUSION
1.0000 mL/kg/h | INTRAVENOUS | Status: DC
  Administered 2020-02-20: 09:00:00 999 mL via INTRAVENOUS

## 2020-02-20 MED ORDER — CLOPIDOGREL BISULFATE 75 MG PO TABS: 75.0000 mg | ORAL_TABLET | Freq: Once | ORAL | Status: DC

## 2020-02-20 MED ORDER — ISOSORBIDE MONONITRATE ER 30 MG PO TB24
30.0000 mg | ORAL_TABLET | Freq: Every day | ORAL | Status: DC
  Administered 2020-02-21 – 2020-02-25 (×5): 30 mg via ORAL
  Filled 2020-02-20 (×5): qty 1

## 2020-02-20 MED ORDER — FENTANYL CITRATE (PF) 100 MCG/2ML IJ SOLN
INTRAMUSCULAR | Status: DC | PRN
  Administered 2020-02-20 (×3): 25 ug via INTRAVENOUS

## 2020-02-20 MED ORDER — HEPARIN (PORCINE) IN NACL 1000-0.9 UT/500ML-% IV SOLN
INTRAVENOUS | Status: AC
  Filled 2020-02-20: qty 500

## 2020-02-20 MED ORDER — FENTANYL CITRATE (PF) 100 MCG/2ML IJ SOLN
INTRAMUSCULAR | Status: AC
  Filled 2020-02-20: qty 2

## 2020-02-20 MED ORDER — HEPARIN SODIUM (PORCINE) 1000 UNIT/ML IJ SOLN
INTRAMUSCULAR | Status: AC
  Filled 2020-02-20: qty 1

## 2020-02-20 MED ORDER — SODIUM CHLORIDE 0.9% FLUSH
3.0000 mL | Freq: Two times a day (BID) | INTRAVENOUS | Status: DC
  Administered 2020-02-20 – 2020-02-25 (×10): 3 mL via INTRAVENOUS

## 2020-02-20 MED ORDER — INSULIN GLARGINE 100 UNIT/ML ~~LOC~~ SOLN
20.0000 [IU] | Freq: Every evening | SUBCUTANEOUS | Status: DC
  Administered 2020-02-20 – 2020-02-24 (×5): 20 [IU] via SUBCUTANEOUS
  Filled 2020-02-20 (×6): qty 0.2

## 2020-02-20 MED ORDER — ONDANSETRON HCL 4 MG/2ML IJ SOLN: 4.0000 mg | Freq: Four times a day (QID) | INTRAMUSCULAR | Status: DC | PRN

## 2020-02-20 MED ORDER — NITROGLYCERIN 1 MG/10 ML FOR IR/CATH LAB
INTRA_ARTERIAL | Status: DC | PRN
  Administered 2020-02-20 (×3): 200 ug via INTRACORONARY

## 2020-02-20 MED ORDER — NITROGLYCERIN 0.4 MG SL SUBL: 0.4000 mg | SUBLINGUAL_TABLET | SUBLINGUAL | Status: DC | PRN

## 2020-02-20 MED ORDER — PANTOPRAZOLE SODIUM 40 MG PO TBEC
40.0000 mg | DELAYED_RELEASE_TABLET | Freq: Every day | ORAL | Status: DC
  Administered 2020-02-20 – 2020-02-25 (×6): 40 mg via ORAL
  Filled 2020-02-20 (×6): qty 1

## 2020-02-20 MED ORDER — ALBUTEROL SULFATE HFA 108 (90 BASE) MCG/ACT IN AERS
2.0000 | INHALATION_SPRAY | Freq: Four times a day (QID) | RESPIRATORY_TRACT | Status: DC | PRN
  Filled 2020-02-20: qty 6.7

## 2020-02-20 MED ORDER — HEPARIN SODIUM (PORCINE) 1000 UNIT/ML IJ SOLN
INTRAMUSCULAR | Status: DC | PRN
  Administered 2020-02-20: 10000 [IU] via INTRAVENOUS
  Administered 2020-02-20: 5000 [IU] via INTRAVENOUS

## 2020-02-20 MED ORDER — ALLOPURINOL 300 MG PO TABS
300.0000 mg | ORAL_TABLET | Freq: Every day | ORAL | Status: DC
  Administered 2020-02-21 – 2020-02-25 (×5): 300 mg via ORAL
  Filled 2020-02-20 (×5): qty 1

## 2020-02-20 MED ORDER — VERAPAMIL HCL 2.5 MG/ML IV SOLN
INTRAVENOUS | Status: DC | PRN
  Administered 2020-02-20: 09:00:00 10 mL via INTRA_ARTERIAL

## 2020-02-20 MED ORDER — SODIUM CHLORIDE 0.9% FLUSH
3.0000 mL | INTRAVENOUS | Status: DC | PRN
  Administered 2020-02-20: 22:00:00 3 mL via INTRAVENOUS

## 2020-02-20 MED ORDER — LEVETIRACETAM 500 MG PO TABS
500.0000 mg | ORAL_TABLET | Freq: Two times a day (BID) | ORAL | Status: DC
  Administered 2020-02-20 – 2020-02-25 (×10): 500 mg via ORAL
  Filled 2020-02-20 (×10): qty 1

## 2020-02-20 MED ORDER — FLUTICASONE PROPIONATE 50 MCG/ACT NA SUSP
1.0000 | Freq: Every day | NASAL | Status: DC
  Administered 2020-02-21 – 2020-02-24 (×3): 1 via NASAL
  Filled 2020-02-20 (×2): qty 16

## 2020-02-20 MED ORDER — IOHEXOL 350 MG/ML SOLN
INTRAVENOUS | Status: DC | PRN
  Administered 2020-02-20: 12:00:00 245 mL

## 2020-02-20 MED ORDER — ENOXAPARIN SODIUM 40 MG/0.4ML ~~LOC~~ SOLN
40.0000 mg | SUBCUTANEOUS | Status: DC
  Administered 2020-02-21 – 2020-02-25 (×5): 40 mg via SUBCUTANEOUS
  Filled 2020-02-20 (×5): qty 0.4

## 2020-02-20 MED ORDER — HYDROCODONE-ACETAMINOPHEN 5-325 MG PO TABS
1.0000 | ORAL_TABLET | Freq: Four times a day (QID) | ORAL | Status: DC | PRN
  Administered 2020-02-20 – 2020-02-25 (×14): 1 via ORAL
  Filled 2020-02-20 (×14): qty 1

## 2020-02-20 MED ORDER — SODIUM CHLORIDE 0.9% FLUSH: 3.0000 mL | Freq: Two times a day (BID) | INTRAVENOUS | Status: DC

## 2020-02-20 MED ORDER — SODIUM CHLORIDE 0.9% FLUSH: 3.0000 mL | INTRAVENOUS | Status: DC | PRN

## 2020-02-20 MED ORDER — VERAPAMIL HCL 2.5 MG/ML IV SOLN
INTRAVENOUS | Status: AC
  Filled 2020-02-20: qty 2

## 2020-02-20 MED ORDER — SODIUM CHLORIDE 0.9 % WEIGHT BASED INFUSION
3.0000 mL/kg/h | INTRAVENOUS | Status: DC
  Administered 2020-02-20: 07:00:00 3 mL/kg/h via INTRAVENOUS

## 2020-02-20 MED ORDER — MIDAZOLAM HCL 2 MG/2ML IJ SOLN
INTRAMUSCULAR | Status: AC
  Filled 2020-02-20: qty 2

## 2020-02-20 MED ORDER — CHLORTHALIDONE 25 MG PO TABS
25.0000 mg | ORAL_TABLET | Freq: Every evening | ORAL | Status: DC
  Administered 2020-02-20 – 2020-02-21 (×2): 25 mg via ORAL
  Filled 2020-02-20 (×2): qty 1

## 2020-02-20 MED ORDER — NITROGLYCERIN 1 MG/10 ML FOR IR/CATH LAB
INTRA_ARTERIAL | Status: AC
  Filled 2020-02-20: qty 10

## 2020-02-20 MED ORDER — ASPIRIN 81 MG PO CHEW: 81.0000 mg | CHEWABLE_TABLET | ORAL | Status: DC

## 2020-02-20 MED ORDER — FUROSEMIDE 20 MG PO TABS
20.0000 mg | ORAL_TABLET | Freq: Every day | ORAL | Status: DC
  Administered 2020-02-20: 15:00:00 20 mg via ORAL
  Filled 2020-02-20: qty 1

## 2020-02-20 MED ORDER — IOHEXOL 350 MG/ML SOLN
INTRAVENOUS | Status: AC
  Filled 2020-02-20: qty 1

## 2020-02-20 MED ORDER — SODIUM CHLORIDE 0.9 % WEIGHT BASED INFUSION
1.0000 mL/kg/h | INTRAVENOUS | Status: AC
  Administered 2020-02-20: 14:00:00 1 mL/kg/h via INTRAVENOUS

## 2020-02-20 MED ORDER — ACETAMINOPHEN 325 MG PO TABS: 650.0000 mg | ORAL_TABLET | ORAL | Status: DC | PRN

## 2020-02-20 MED ORDER — VIPERSLIDE LUBRICANT OPTIME
TOPICAL | Status: DC | PRN
  Administered 2020-02-20: 10:00:00 200 mL via SURGICAL_CAVITY
  Administered 2020-02-20: 09:00:00 20 mL via SURGICAL_CAVITY

## 2020-02-20 MED ORDER — MIDAZOLAM HCL 2 MG/2ML IJ SOLN
INTRAMUSCULAR | Status: DC | PRN
  Administered 2020-02-20 (×2): 1 mg via INTRAVENOUS
  Administered 2020-02-20: 2 mg via INTRAVENOUS

## 2020-02-20 MED ORDER — POTASSIUM CHLORIDE CRYS ER 20 MEQ PO TBCR: 20.0000 meq | EXTENDED_RELEASE_TABLET | Freq: Every day | ORAL | Status: DC

## 2020-02-20 MED ORDER — GABAPENTIN 300 MG PO CAPS
300.0000 mg | ORAL_CAPSULE | Freq: Three times a day (TID) | ORAL | Status: DC
  Administered 2020-02-20 – 2020-02-25 (×15): 300 mg via ORAL
  Filled 2020-02-20 (×15): qty 1

## 2020-02-20 MED ORDER — ASPIRIN EC 81 MG PO TBEC
81.0000 mg | DELAYED_RELEASE_TABLET | Freq: Every day | ORAL | Status: DC
  Administered 2020-02-21 – 2020-02-25 (×5): 81 mg via ORAL
  Filled 2020-02-20 (×5): qty 1

## 2020-02-20 SURGICAL SUPPLY — 32 items
BALLN SAPPHIRE 2.5X20 (BALLOONS) ×2
BALLN SAPPHIRE ~~LOC~~ 2.5X18 (BALLOONS) ×2 IMPLANT
BALLN SAPPHIRE ~~LOC~~ 3.25X18 (BALLOONS) ×2 IMPLANT
BALLN SAPPHIRE ~~LOC~~ 3.5X18 (BALLOONS) ×2 IMPLANT
BALLN SAPPHIRE ~~LOC~~ 3.75X8 (BALLOONS) ×2 IMPLANT
BALLOON SAPPHIRE 2.5X20 (BALLOONS) ×1 IMPLANT
CATH LAUNCHER 6FR EBU 3.75 (CATHETERS) ×2 IMPLANT
CATH LAUNCHER 6FR EBU 4 (CATHETERS) ×2 IMPLANT
CATH OPTICROSS HD (CATHETERS) ×2 IMPLANT
CATH TELESCOPE 6F GEC (CATHETERS) ×2 IMPLANT
CATH VISTA GUIDE 6FR JL3 (CATHETERS) ×2 IMPLANT
CATH VISTA GUIDE 6FR JL3.5 (CATHETERS) ×2 IMPLANT
CATH VISTA GUIDE 6FR XBLAD3.5 (CATHETERS) ×2 IMPLANT
CROWN DIAMONDBACK CLASSIC 1.25 (BURR) ×2 IMPLANT
DEVICE RAD COMP TR BAND LRG (VASCULAR PRODUCTS) ×2 IMPLANT
GLIDESHEATH SLEND SS 6F .021 (SHEATH) ×2 IMPLANT
GUIDEWIRE INQWIRE 1.5J.035X260 (WIRE) ×1 IMPLANT
INQWIRE 1.5J .035X260CM (WIRE) ×2
KIT ENCORE 26 ADVANTAGE (KITS) ×2 IMPLANT
KIT HEART LEFT (KITS) ×2 IMPLANT
LUBRICANT VIPERSLIDE CORONARY (MISCELLANEOUS) ×2 IMPLANT
PACK CARDIAC CATHETERIZATION (CUSTOM PROCEDURE TRAY) ×2 IMPLANT
SLED PULL BACK IVUS (MISCELLANEOUS) ×2 IMPLANT
STENT RESOLUTE ONYX 2.5X38 (Permanent Stent) ×2 IMPLANT
STENT RESOLUTE ONYX 3.0X15 (Permanent Stent) ×2 IMPLANT
STENT RESOLUTE ONYX3.0X38 (Permanent Stent) ×2 IMPLANT
STENT SYNERGY XD 2.50X48 (Permanent Stent) ×1 IMPLANT
SYNERGY XD 2.50X48 (Permanent Stent) ×2 IMPLANT
TRANSDUCER W/STOPCOCK (MISCELLANEOUS) ×2 IMPLANT
TUBING CIL FLEX 10 FLL-RA (TUBING) ×2 IMPLANT
WIRE ASAHI PROWATER 180CM (WIRE) ×2 IMPLANT
WIRE VIPERWIRE COR FLEX .012 (WIRE) ×2 IMPLANT

## 2020-02-20 NOTE — Interval H&P Note (Signed)
History and Physical Interval Note:  02/20/2020 8:09 AM  Micheal Bell  has presented today for surgery, with the diagnosis of CAD.  The various methods of treatment have been discussed with the patient and family. After consideration of risks, benefits and other options for treatment, the patient has consented to  Procedure(s): CORONARY STENT INTERVENTION (N/A) as a surgical intervention.  The patient's history has been reviewed, patient examined, no change in status, stable for surgery.  I have reviewed the patient's chart and labs.  Questions were answered to the patient's satisfaction.   Cath Lab Visit (complete for each Cath Lab visit)  Clinical Evaluation Leading to the Procedure:   ACS: No.  Non-ACS:    Anginal Classification: CCS III  Anti-ischemic medical therapy: Maximal Therapy (2 or more classes of medications)  Non-Invasive Test Results: No non-invasive testing performed  Prior CABG: No previous CABG        [name] [facility] 02/20/2020 8:09 AM

## 2020-02-20 NOTE — Interval H&P Note (Signed)
History and Physical Interval Note:  02/20/2020 8:10 AM  [name]  has presented today for surgery, with the diagnosis of CAD.  The various methods of treatment have been discussed with the patient and family. After consideration of risks, benefits and other options for treatment, the patient has consented to  Procedure(s): CORONARY STENT INTERVENTION (N/A) as a surgical intervention.  The patient's history has been reviewed, patient examined, no change in status, stable for surgery.  I have reviewed the patient's chart and labs.  Questions were answered to the patient's satisfaction.    Cath Lab Visit (complete for each Cath Lab visit)  Clinical Evaluation Leading to the Procedure:   ACS: No.  Non-ACS:    Anginal Classification: CCS III  Anti-ischemic medical therapy: Maximal Therapy (2 or more classes of medications)  Non-Invasive Test Results: High-risk stress test findings: cardiac mortality >3%/year  Prior CABG: No previous CABG       [name] [facility] 02/20/2020 8:10 AM

## 2020-02-21 ENCOUNTER — Encounter (HOSPITAL_COMMUNITY): Payer: Self-pay | Admitting: Cardiology

## 2020-02-21 ENCOUNTER — Other Ambulatory Visit: Payer: Self-pay

## 2020-02-21 DIAGNOSIS — R079 Chest pain, unspecified: Secondary | ICD-10-CM | POA: Diagnosis present

## 2020-02-21 DIAGNOSIS — D6489 Other specified anemias: Secondary | ICD-10-CM | POA: Diagnosis present

## 2020-02-21 DIAGNOSIS — Z8616 Personal history of COVID-19: Secondary | ICD-10-CM | POA: Diagnosis not present

## 2020-02-21 DIAGNOSIS — E785 Hyperlipidemia, unspecified: Secondary | ICD-10-CM

## 2020-02-21 DIAGNOSIS — D72828 Other elevated white blood cell count: Secondary | ICD-10-CM | POA: Diagnosis present

## 2020-02-21 DIAGNOSIS — Z7902 Long term (current) use of antithrombotics/antiplatelets: Secondary | ICD-10-CM | POA: Diagnosis not present

## 2020-02-21 DIAGNOSIS — M109 Gout, unspecified: Secondary | ICD-10-CM | POA: Diagnosis present

## 2020-02-21 DIAGNOSIS — Z79899 Other long term (current) drug therapy: Secondary | ICD-10-CM | POA: Diagnosis not present

## 2020-02-21 DIAGNOSIS — I5022 Chronic systolic (congestive) heart failure: Secondary | ICD-10-CM | POA: Diagnosis not present

## 2020-02-21 DIAGNOSIS — I25119 Atherosclerotic heart disease of native coronary artery with unspecified angina pectoris: Secondary | ICD-10-CM | POA: Diagnosis present

## 2020-02-21 DIAGNOSIS — I10 Essential (primary) hypertension: Secondary | ICD-10-CM | POA: Diagnosis not present

## 2020-02-21 DIAGNOSIS — Z8249 Family history of ischemic heart disease and other diseases of the circulatory system: Secondary | ICD-10-CM | POA: Diagnosis not present

## 2020-02-21 DIAGNOSIS — I110 Hypertensive heart disease with heart failure: Secondary | ICD-10-CM | POA: Diagnosis present

## 2020-02-21 DIAGNOSIS — Z86718 Personal history of other venous thrombosis and embolism: Secondary | ICD-10-CM | POA: Diagnosis not present

## 2020-02-21 DIAGNOSIS — I25118 Atherosclerotic heart disease of native coronary artery with other forms of angina pectoris: Secondary | ICD-10-CM | POA: Diagnosis not present

## 2020-02-21 DIAGNOSIS — I471 Supraventricular tachycardia: Secondary | ICD-10-CM | POA: Diagnosis not present

## 2020-02-21 DIAGNOSIS — Z7951 Long term (current) use of inhaled steroids: Secondary | ICD-10-CM | POA: Diagnosis not present

## 2020-02-21 DIAGNOSIS — E876 Hypokalemia: Secondary | ICD-10-CM | POA: Diagnosis not present

## 2020-02-21 DIAGNOSIS — Z794 Long term (current) use of insulin: Secondary | ICD-10-CM | POA: Diagnosis not present

## 2020-02-21 DIAGNOSIS — M797 Fibromyalgia: Secondary | ICD-10-CM | POA: Diagnosis present

## 2020-02-21 DIAGNOSIS — Z7982 Long term (current) use of aspirin: Secondary | ICD-10-CM | POA: Diagnosis not present

## 2020-02-21 DIAGNOSIS — I255 Ischemic cardiomyopathy: Secondary | ICD-10-CM | POA: Diagnosis present

## 2020-02-21 DIAGNOSIS — Z955 Presence of coronary angioplasty implant and graft: Secondary | ICD-10-CM | POA: Diagnosis not present

## 2020-02-21 DIAGNOSIS — I209 Angina pectoris, unspecified: Secondary | ICD-10-CM | POA: Diagnosis not present

## 2020-02-21 DIAGNOSIS — E669 Obesity, unspecified: Secondary | ICD-10-CM | POA: Diagnosis present

## 2020-02-21 DIAGNOSIS — E119 Type 2 diabetes mellitus without complications: Secondary | ICD-10-CM | POA: Diagnosis present

## 2020-02-21 LAB — CBC
HCT: 33.3 % — ABNORMAL LOW (ref 39.0–52.0)
Hemoglobin: 11.4 g/dL — ABNORMAL LOW (ref 13.0–17.0)
MCH: 29.3 pg (ref 26.0–34.0)
MCHC: 34.2 g/dL (ref 30.0–36.0)
MCV: 85.6 fL (ref 80.0–100.0)
Platelets: 281 10*3/uL (ref 150–400)
RBC: 3.89 MIL/uL — ABNORMAL LOW (ref 4.22–5.81)
RDW: 13.4 % (ref 11.5–15.5)
WBC: 9.8 10*3/uL (ref 4.0–10.5)
nRBC: 0 % (ref 0.0–0.2)

## 2020-02-21 LAB — BASIC METABOLIC PANEL
Anion gap: 14 (ref 5–15)
Anion gap: 10 (ref 5–15)
BUN: 9 mg/dL (ref 6–20)
BUN: 9 mg/dL (ref 6–20)
CO2: 27 mmol/L (ref 22–32)
CO2: 28 mmol/L (ref 22–32)
Calcium: 9.2 mg/dL (ref 8.9–10.3)
Calcium: 9.1 mg/dL (ref 8.9–10.3)
Chloride: 95 mmol/L — ABNORMAL LOW (ref 98–111)
Chloride: 96 mmol/L — ABNORMAL LOW (ref 98–111)
Creatinine, Ser: 0.79 mg/dL (ref 0.61–1.24)
Creatinine, Ser: 0.75 mg/dL (ref 0.61–1.24)
GFR, Estimated: >60 mL/min (ref >60)
GFR, Estimated: >60 mL/min (ref >60)
Glucose, Bld: 180 mg/dL — ABNORMAL HIGH (ref 70–99)
Glucose, Bld: 191 mg/dL — ABNORMAL HIGH (ref 70–99)
Potassium: 2.6 mmol/L — CL (ref 3.5–5.1)
Potassium: 3.1 mmol/L — ABNORMAL LOW (ref 3.5–5.1)
Sodium: 136 mmol/L (ref 135–145)
Sodium: 134 mmol/L — ABNORMAL LOW (ref 135–145)

## 2020-02-21 LAB — MRSA PCR SCREENING: MRSA by PCR: NEGATIVE

## 2020-02-21 MED ORDER — ATORVASTATIN CALCIUM 80 MG PO TABS
80.0000 mg | ORAL_TABLET | Freq: Every day | ORAL | Status: DC
  Administered 2020-02-22 – 2020-02-23 (×2): 80 mg via ORAL
  Filled 2020-02-21 (×2): qty 1

## 2020-02-21 MED ORDER — POTASSIUM CHLORIDE CRYS ER 20 MEQ PO TBCR
40.0000 meq | EXTENDED_RELEASE_TABLET | Freq: Once | ORAL | Status: AC
  Administered 2020-02-21: 21:00:00 40 meq via ORAL
  Filled 2020-02-21: qty 2

## 2020-02-21 MED ORDER — ATORVASTATIN CALCIUM 40 MG PO TABS
40.0000 mg | ORAL_TABLET | Freq: Every day | ORAL | Status: DC
  Administered 2020-02-21: 09:00:00 40 mg via ORAL
  Filled 2020-02-21: qty 1

## 2020-02-21 MED ORDER — POTASSIUM CHLORIDE CRYS ER 20 MEQ PO TBCR
40.0000 meq | EXTENDED_RELEASE_TABLET | Freq: Once | ORAL | Status: AC
  Administered 2020-02-21: 19:00:00 40 meq via ORAL
  Filled 2020-02-21: qty 2

## 2020-02-21 MED ORDER — POTASSIUM CHLORIDE CRYS ER 20 MEQ PO TBCR: 20.0000 meq | EXTENDED_RELEASE_TABLET | Freq: Every day | ORAL | Status: DC

## 2020-02-21 MED ORDER — POTASSIUM CHLORIDE CRYS ER 20 MEQ PO TBCR
40.0000 meq | EXTENDED_RELEASE_TABLET | Freq: Three times a day (TID) | ORAL | Status: AC
  Administered 2020-02-21 (×3): 40 meq via ORAL
  Filled 2020-02-21 (×3): qty 2

## 2020-02-21 MED ORDER — CARVEDILOL 6.25 MG PO TABS
6.2500 mg | ORAL_TABLET | Freq: Two times a day (BID) | ORAL | Status: DC
  Administered 2020-02-21 – 2020-02-24 (×6): 6.25 mg via ORAL
  Filled 2020-02-21 (×6): qty 1

## 2020-02-21 MED ORDER — FUROSEMIDE 20 MG PO TABS: 20.0000 mg | ORAL_TABLET | Freq: Every day | ORAL | Status: DC

## 2020-02-21 NOTE — Progress Notes (Signed)
   Potassium 2.6  KCL PO TID for one day ordered  Donato Schultz, MD

## 2020-02-21 NOTE — Progress Notes (Signed)
Heart Failure Stewardship Pharmacist Progress Note   PCP: [name], MD PCP-Cardiologist: [name], MD    HPI:  49 yo M with PMH of DM, CHF, and CAD. He presented to outpatient cardiology for follow up of abnormal stress test. Underwent cardiac cath that showed severe stenosis of LCx and mLAD and then underwent outpatient CABG evaluation but was not deemed to be a surgical candidate. He was brought back to the hospital for cath and PCI. His last ECHO was done on 8/19/21and LVEF was 30-35%. LVEF was 34% on nuclear stress test on 01/10/20.  Current HF Medications: Furosemide 20 mg daily Carvedilol 6.25 mg BID Losartan 12.5 mg daily Imdur 30 mg daily  Prior to admission HF Medications: Furosemide 20 mg daily Carvedilol 3.125 mg BID Losartan 12.5 mg BID Imdur 30 mg daily  Pertinent Lab Values: . Serum creatinine 0.79, BUN 9, Potassium 2.6, Sodium 136  Vital Signs: . Weight: 231 lbs (admission weight: 230 lbs) . Blood pressure: 100-120/80-90s  . Heart rate: 90-100s   Medication Assistance / Insurance Benefits Check: Does the patient have prescription insurance?  Yes Type of insurance plan: [place] Medicaid  Outpatient Pharmacy:  Prior to admission outpatient pharmacy: [facility] Is the patient willing to use [facility] TOC pharmacy at discharge? Pending Is the patient willing to transition their outpatient pharmacy to utilize a [facility] outpatient pharmacy?   Pending    Assessment: 1. Chronic systolic CHF (EF 40-34%), due to ICM. NYHA class II symptoms. - Continue furosemide 20 mg daily - Continue carvedilol 6.25 mg BID - Continue losartan 12.5 mg daily - Consider adding Farxiga 10 mg daily prior to discharge to optimize HF regimen - Continue Imdur 30 mg daily   Plan: 1) Medication changes recommended at this time: - Add Farxiga 10 mg daily  2) Patient assistance application(s): - None pending; patient has Medicaid (copay $0-3 per prescription) - Marcelline Deist  would require prior authorization but I can complete this if it's added  3)  Education  - To be completed prior to discharge  Sharen Hones, PharmD, BCPS Heart Failure Stewardship Pharmacist Phone [phone]

## 2020-02-21 NOTE — Progress Notes (Signed)
CARDIAC REHAB PHASE I   PRE:  Rate/Rhythm: 116 ST  Got pt from bathroom  BP:  Supine:   Sitting: 86/74, 103/79  Standing:    SaO2: 97%RA  MODE:  Ambulation: 400 ft   POST:  Rate/Rhythm: 131 ST  BP:  Supine:   Sitting: 129/92, 123/93  Standing:    SaO2: 98%RA 0930-1035 Pt walked 400 ft on RA with rolling walker after using bathroom. Pt stated he felt less short of breath walking now than before procedure. Denied CP,SOB or dizziness. HR to 131. Discussed importance of plavix with stent. Reviewed NTG use, carb counting and heart healthy food choices, walking as tolerated for exercise and CRP 2. Referral to [place] placed. Pt voiced understanding of education. Left sitting on side of bed with call bell.   Luetta Nutting, RN BSN  02/21/2020 10:30 AM

## 2020-02-21 NOTE — Progress Notes (Signed)
CRITICAL VALUE ALERT  Critical Value:  Potassium 2.6  Date & Time Notied:  02/21/2020 at 0459  Provider Notified: on call for Cardiology paged at 0502 on 02/21/2020  Orders Received/Actions taken: order entered per on call

## 2020-02-21 NOTE — Plan of Care (Signed)
°  Problem: Education: Goal: Understanding of CV disease, CV risk reduction, and recovery process will improve Outcome: Progressing Goal: Individualized Educational Video(s) Outcome: Progressing

## 2020-02-21 NOTE — Progress Notes (Addendum)
Progress Note  Patient Name: Micheal Bell Date of Encounter: 02/21/2020  [facility] Cardiologist: [name], MD   Subjective   Had a few brief episodes of chest discomfort overnight. Nothing this morning.   Inpatient Medications    Scheduled Meds: . allopurinol  300 mg Oral Daily  . aspirin EC  81 mg Oral Daily  . buPROPion  150 mg Oral q morning - 10a  . carvedilol  3.125 mg Oral BID  . chlorthalidone  25 mg Oral QPM  . clopidogrel  75 mg Oral Daily  . enoxaparin (LOVENOX) injection  40 mg Subcutaneous Q24H  . fluticasone  1 spray Each Nare Daily  . furosemide  20 mg Oral Daily  . gabapentin  300 mg Oral TID  . insulin glargine  20 Units Subcutaneous QPM  . isosorbide mononitrate  30 mg Oral Daily  . levETIRAcetam  500 mg Oral BID  . losartan  12.5 mg Oral Daily  . mometasone-formoterol  2 puff Inhalation BID  . montelukast  10 mg Oral QHS  . nortriptyline  25 mg Oral QHS  . pantoprazole  40 mg Oral Daily  . [START ON 02/22/2020] potassium chloride SA  20 mEq Oral Daily  . potassium chloride  40 mEq Oral TID  . sodium chloride flush  3 mL Intravenous Q12H   Continuous Infusions: . sodium chloride     PRN Meds: sodium chloride, acetaminophen, albuterol, cyclobenzaprine, HYDROcodone-acetaminophen, nitroGLYCERIN, ondansetron (ZOFRAN) IV, sodium chloride flush, zolpidem   Vital Signs    Vitals:   02/20/20 2145 02/21/20 0010 02/21/20 0019 02/21/20 0554  BP: 98/67 93/73  101/80  Pulse: 100 (!) 102 98 87  Resp: 16 14  18   Temp:  98.1 F (36.7 C)  97.7 F (36.5 C)  TempSrc:  Oral  Oral  SpO2:  95%  94%  Weight:    105.1 kg  Height:        Intake/Output Summary (Last 24 hours) at 02/21/2020 0744 Last data filed at 02/21/2020 0555 Gross per 24 hour  Intake 986 ml  Output 2175 ml  Net -1189 ml   Last 3 Weights 02/21/2020 02/20/2020 02/14/2020  Weight (lbs) 231 lb 12.8 oz 230 lb 230 lb  Weight (kg) 105.144 kg 104.327 kg 104.327 kg       Telemetry    SR - Personally Reviewed  ECG    No new tracing this morning.  Physical Exam  Pleasant male, sitting up in bed.  GEN: No acute distress.   Neck: No JVD Cardiac: RRR, no murmurs, rubs, or gallops.  Respiratory: Clear to auscultation bilaterally. GI: Soft, nontender, non-distended  MS: No edema; No deformity. Right radial site stable. Neuro:  Nonfocal  Psych: Normal affect   Labs    High Sensitivity Troponin:  No results for input(s): TROPONINIHS in the last 720 hours.    Chemistry Recent Labs  Lab 02/15/20 0956 02/20/20 1406 02/21/20 0254  NA 136  --  136  K 3.5  --  2.6*  CL 92*  --  95*  CO2 31*  --  27  GLUCOSE 222*  --  180*  BUN 12  --  9  CREATININE 0.84 0.76 0.79  CALCIUM 10.2  --  9.2  GFRNONAA 101 >60 >60  GFRAA 117  --   --   ANIONGAP  --   --  14     Hematology Recent Labs  Lab 02/15/20 0956 02/20/20 1406 02/21/20 0254  WBC 9.6  8.3 9.8  RBC 4.49 4.07* 3.89*  HGB 13.3 11.7* 11.4*  HCT 38.8 35.2* 33.3*  MCV 86 86.5 85.6  MCH 29.6 28.7 29.3  MCHC 34.3 33.2 34.2  RDW 14.8 13.3 13.4  PLT 363 316 281    BNPNo results for input(s): BNP, PROBNP in the last 168 hours.   DDimer No results for input(s): DDIMER in the last 168 hours.   Lipid Panel     Component Value Date/Time   CHOL 168 10/26/2019 0402   TRIG 130 10/26/2019 0402   HDL 43 10/26/2019 0402   CHOLHDL 3.9 10/26/2019 0402   VLDL 26 10/26/2019 0402   LDLCALC 99 10/26/2019 0402   Radiology    CARDIAC CATHETERIZATION  Result Date: 02/20/2020  Dist LAD lesion is 70% stenosed.  A drug-eluting stent was successfully placed using a STENT RESOLUTE ONYX 2.5X38.  Post intervention, there is a 0% residual stenosis.  Prox LAD to Mid LAD lesion is 95% stenosed.  A drug-eluting stent was successfully placed using a STENT RESOLUTE JKKX3.8H82.  A drug-eluting stent was successfully placed using a STENT RESOLUTE ONYX 3.0X15.  Post intervention, there is a 0% residual  stenosis.  Prox Cx to Mid Cx lesion is 90% stenosed.  A drug-eluting stent was successfully placed using a SYNERGY XD 2.50X48.  Post intervention, there is a 0% residual stenosis.  1. Successful complex PCI of the proximal to distal LAD with orbital atherectomy and DES x 3 with IVUS guidance. 2. Successful PCI of the LCX/OM1 with long DES x 1. Plan: DAPT indefinitely due to extensive nature of stents. Will monitor overnight and anticipate DC in am. We did not treat the PDA lesion as this vessel is small in caliber and diffusely diseased. I would manage this medically.    Cardiac Studies   Cath: 02/20/20   Dist LAD lesion is 70% stenosed.  A drug-eluting stent was successfully placed using a STENT RESOLUTE ONYX 2.5X38.  Post intervention, there is a 0% residual stenosis.  Prox LAD to Mid LAD lesion is 95% stenosed.  A drug-eluting stent was successfully placed using a STENT RESOLUTE [id number].  A drug-eluting stent was successfully placed using a STENT RESOLUTE ONYX 3.0X15.  Post intervention, there is a 0% residual stenosis.  Prox Cx to Mid Cx lesion is 90% stenosed.  A drug-eluting stent was successfully placed using a SYNERGY XD 2.50X48.  Post intervention, there is a 0% residual stenosis.   1. Successful complex PCI of the proximal to distal LAD with orbital atherectomy and DES x 3 with IVUS guidance. 2. Successful PCI of the LCX/OM1 with long DES x 1.  Plan: DAPT indefinitely due to extensive nature of stents. Will monitor overnight and anticipate DC in am. We did not treat the PDA lesion as this vessel is small in caliber and diffusely diseased. I would manage this medically.   Diagnostic Dominance: Right    Intervention      Patient Profile     49 y.o. male with PMH of uncontrolled DM, systolic HF, CAD, HL who presented to the office on 11/30 for follow up of abnormal stress test. Underwent outpatient cardiac cath showing severe stenosis of the LCx and mLAD.  He was referred for outpatient CABG evaluation but deemed not a candidate given deconditioning from recent COVID PNA. Brought back for outpatient cath with PCI.   Assessment & Plan    1. CAD: underwent cath noted above with successful bu complex PCI of the p/dLAD with orbital atherectomy  and DES x3 with IVUS guidance, along with successful PCI of the Lcx/OM1 with DES x1. Plan for DAPT indefinitely given extensive stenting. Had a few episodes of chest pain overnight. Will plan for ambulation with CR this morning.  -- continue DAPT ASA/plavix, statin, BB, Imdur and ARB  2. ICM: EF noted at 30-35% on echo 09/2019. He was started on Coreg and Losartan as an outpatient with plans to transition to [facility] post cath. Does not appear volume overloaded on exam today. Blood pressures are soft, will not further titrate medications for now.  3. HLD: reports being on atorvastatin 40mg  daily. Will reorder this morning.  4. DVT: R popliteal vein. Completed 3 months Eliquis on 12/17.   5. Hypokalemia: K+ 2.6 this morning. Supplement ordered. Will recheck BMET this afternoon. Hold lasix this morning.   For questions or updates, please contact [facility] Please consult [email or web address] for contact info under        Signed, 1/18, NP  02/21/2020, 7:44 AM      Patient seen and examined. Agree with assessment and plan.  Coronary intervention reviewed.  Complex procedure requiring orbital atherectomy, and extensive DES stenting of the proximal to mid LAD stent with session of 3 stentsextending for approximately  90 mm of stent.  Following completion there was then difficult intervention involving the circumflex marginal vessel which also was successfully stented a 2.5 x 48 mm stent to cover diffuse stenosis.  The plan is for medical management of his high-grade RCA PDA stenosis which is a small caliber vessel.  Patient is hypokalemic with potassium of 2.6 and had received supplemental potassium.  He is  not well beta blocked with resting pulse in the 90s and only on carvedilol 3.125 mg twice a day.  Will increase carvedilol to 6.25 mg twice a day today further titration as blood pressure and heart rate allow.  When DAPT indefinitely due to extensive stented segments.  DL cholesterol is 96.  We will reorder atorvastatin but at 80 mg for aggressive lipid-lowering therapy.  Consider concomitant Zetia if unable to get less than 70 or PCSK9 inhibition.  Ultimately, he will need future LV function assessment following successful coronary revascularization.  We will keep in hospital today.  If recurrent chest pain, consider further titration of isosorbide.   02/23/2020, MD, [facility] 02/21/2020 9:16 AM

## 2020-02-22 DIAGNOSIS — I25118 Atherosclerotic heart disease of native coronary artery with other forms of angina pectoris: Secondary | ICD-10-CM | POA: Diagnosis not present

## 2020-02-22 DIAGNOSIS — I5022 Chronic systolic (congestive) heart failure: Secondary | ICD-10-CM

## 2020-02-22 DIAGNOSIS — Z955 Presence of coronary angioplasty implant and graft: Secondary | ICD-10-CM

## 2020-02-22 DIAGNOSIS — E876 Hypokalemia: Secondary | ICD-10-CM | POA: Diagnosis not present

## 2020-02-22 LAB — BASIC METABOLIC PANEL
Anion gap: 9 (ref 5–15)
BUN: 10 mg/dL (ref 6–20)
CO2: 28 mmol/L (ref 22–32)
Calcium: 9.1 mg/dL (ref 8.9–10.3)
Chloride: 98 mmol/L (ref 98–111)
Creatinine, Ser: 0.82 mg/dL (ref 0.61–1.24)
GFR, Estimated: >60 mL/min (ref >60)
Glucose, Bld: 184 mg/dL — ABNORMAL HIGH (ref 70–99)
Potassium: 3.3 mmol/L — ABNORMAL LOW (ref 3.5–5.1)
Sodium: 135 mmol/L (ref 135–145)

## 2020-02-22 LAB — GLUCOSE, CAPILLARY
Glucose-Capillary: 205 mg/dL — ABNORMAL HIGH (ref 70–99)
Glucose-Capillary: 156 mg/dL — ABNORMAL HIGH (ref 70–99)
Glucose-Capillary: 131 mg/dL — ABNORMAL HIGH (ref 70–99)

## 2020-02-22 LAB — MAGNESIUM: Magnesium: 1.4 mg/dL — ABNORMAL LOW (ref 1.7–2.4)

## 2020-02-22 MED ORDER — MAGNESIUM SULFATE 4 GM/100ML IV SOLN
4.0000 g | Freq: Once | INTRAVENOUS | Status: AC
  Administered 2020-02-22: 10:00:00 4 g via INTRAVENOUS
  Filled 2020-02-22: qty 100

## 2020-02-22 MED ORDER — SPIRONOLACTONE 12.5 MG HALF TABLET
12.5000 mg | ORAL_TABLET | Freq: Every day | ORAL | Status: DC
  Administered 2020-02-22 – 2020-02-23 (×2): 12.5 mg via ORAL
  Filled 2020-02-22 (×2): qty 1

## 2020-02-22 MED ORDER — INSULIN ASPART 100 UNIT/ML ~~LOC~~ SOLN
0.0000 [IU] | Freq: Three times a day (TID) | SUBCUTANEOUS | Status: DC
  Administered 2020-02-22: 14:00:00 5 [IU] via SUBCUTANEOUS
  Administered 2020-02-22 – 2020-02-23 (×4): 3 [IU] via SUBCUTANEOUS
  Administered 2020-02-24 (×2): 2 [IU] via SUBCUTANEOUS
  Administered 2020-02-24: 3 [IU] via SUBCUTANEOUS
  Administered 2020-02-25 (×2): 2 [IU] via SUBCUTANEOUS

## 2020-02-22 MED ORDER — POTASSIUM CHLORIDE CRYS ER 20 MEQ PO TBCR
40.0000 meq | EXTENDED_RELEASE_TABLET | Freq: Three times a day (TID) | ORAL | Status: DC
  Administered 2020-02-22 (×3): 40 meq via ORAL
  Filled 2020-02-22 (×3): qty 2

## 2020-02-22 NOTE — Plan of Care (Signed)
  Problem: Education: Goal: Understanding of CV disease, CV risk reduction, and recovery process will improve Outcome: Progressing Goal: Individualized Educational Video(s) Outcome: Progressing   

## 2020-02-22 NOTE — Progress Notes (Addendum)
Progress Note  Patient Name: Micheal Bell Date of Encounter: 02/22/2020  Primary Cardiologist: [name], MD  Subjective   Felt mildly "smothered" earlier this morning, felt better sitting up. About to walk with cardiac rehab. No CP or edema.  Inpatient Medications    Scheduled Meds: . allopurinol  300 mg Oral Daily  . aspirin EC  81 mg Oral Daily  . atorvastatin  80 mg Oral Daily  . buPROPion  150 mg Oral q morning - 10a  . carvedilol  6.25 mg Oral BID  . chlorthalidone  25 mg Oral QPM  . clopidogrel  75 mg Oral Daily  . enoxaparin (LOVENOX) injection  40 mg Subcutaneous Q24H  . fluticasone  1 spray Each Nare Daily  . furosemide  20 mg Oral Daily  . gabapentin  300 mg Oral TID  . insulin glargine  20 Units Subcutaneous QPM  . isosorbide mononitrate  30 mg Oral Daily  . levETIRAcetam  500 mg Oral BID  . losartan  12.5 mg Oral Daily  . mometasone-formoterol  2 puff Inhalation BID  . montelukast  10 mg Oral QHS  . nortriptyline  25 mg Oral QHS  . pantoprazole  40 mg Oral Daily  . potassium chloride SA  40 mEq Oral TID  . sodium chloride flush  3 mL Intravenous Q12H   Continuous Infusions: . sodium chloride    . magnesium sulfate bolus IVPB     PRN Meds: sodium chloride, acetaminophen, albuterol, cyclobenzaprine, HYDROcodone-acetaminophen, nitroGLYCERIN, ondansetron (ZOFRAN) IV, sodium chloride flush, zolpidem   Vital Signs    Vitals:   02/21/20 1330 02/21/20 1951 02/22/20 0500 02/22/20 0609  BP: 108/72 109/66  109/69  Pulse: 98 100  90  Resp: 20 16  18   Temp: 98.7 F (37.1 C) 98.4 F (36.9 C)  98.6 F (37 C)  TempSrc: Oral Oral  Oral  SpO2: 94% 97%  99%  Weight:    106 kg  Height:   6\' 2"  (1.88 m)     Intake/Output Summary (Last 24 hours) at 02/22/2020 0757 Last data filed at 02/21/2020 2237 Gross per 24 hour  Intake --  Output 500 ml  Net -500 ml   Last 3 Weights 02/22/2020 02/21/2020 02/20/2020  Weight (lbs) 233 lb 9.6 oz 231 lb 12.8 oz  230 lb  Weight (kg) 105.96 kg 105.144 kg 104.327 kg     Telemetry    NSR/sinus tach, very brief irregular run of an atrial arrhythmia last night after 8pm - Personally Reviewed  Physical Exam   GEN: No acute distress.  HEENT: Normocephalic, atraumatic, sclera non-icteric. Neck: No JVD or bruits. Cardiac: RRR no murmurs, rubs, or gallops.  Radials/DP/PT 1+ and equal bilaterally.  Respiratory: Diffusely diminished to auscultation bilaterally. Breathing is unlabored. GI: Soft, nontender, non-distended, BS +x 4. MS: no deformity. Extremities: No clubbing or cyanosis. No edema. Distal pedal pulses are 2+ and equal bilaterally. RIght radial cath site without hematoma or ecchymosis; good pulse. Neuro:  AAOx3. Follows commands. Psych:  Responds to questions appropriately with a normal affect.  Labs    High Sensitivity Troponin:  No results for input(s): TROPONINIHS in the last 720 hours.    Cardiac EnzymesNo results for input(s): TROPONINI in the last 168 hours. No results for input(s): TROPIPOC in the last 168 hours.   Chemistry Recent Labs  Lab 02/15/20 0956 02/20/20 1406 02/21/20 0254 02/21/20 1528 02/22/20 0206  NA 136  --  136 134* 135  K 3.5  --  2.6* 3.1* 3.3*  CL 92*  --  95* 96* 98  CO2 31*  --  27 28 28   GLUCOSE 222*  --  180* 191* 184*  BUN 12  --  9 9 10   CREATININE 0.84   < > 0.79 0.75 0.82  CALCIUM 10.2  --  9.2 9.1 9.1  GFRNONAA 101   < > >60 >60 >60  GFRAA 117  --   --   --   --   ANIONGAP  --   --  14 10 9    < > = values in this interval not displayed.     Hematology Recent Labs  Lab 02/15/20 0956 02/20/20 1406 02/21/20 0254  WBC 9.6 8.3 9.8  RBC 4.49 4.07* 3.89*  HGB 13.3 11.7* 11.4*  HCT 38.8 35.2* 33.3*  MCV 86 86.5 85.6  MCH 29.6 28.7 29.3  MCHC 34.3 33.2 34.2  RDW 14.8 13.3 13.4  PLT 363 316 281    BNPNo results for input(s): BNP, PROBNP in the last 168 hours.   DDimer No results for input(s): DDIMER in the last 168 hours.    Radiology    CARDIAC CATHETERIZATION  Result Date: 02/20/2020  Dist LAD lesion is 70% stenosed.  A drug-eluting stent was successfully placed using a STENT RESOLUTE ONYX 2.5X38.  Post intervention, there is a 0% residual stenosis.  Prox LAD to Mid LAD lesion is 95% stenosed.  A drug-eluting stent was successfully placed using a STENT RESOLUTE 02/22/20.  A drug-eluting stent was successfully placed using a STENT RESOLUTE ONYX 3.0X15.  Post intervention, there is a 0% residual stenosis.  Prox Cx to Mid Cx lesion is 90% stenosed.  A drug-eluting stent was successfully placed using a SYNERGY XD 2.50X48.  Post intervention, there is a 0% residual stenosis.  1. Successful complex PCI of the proximal to distal LAD with orbital atherectomy and DES x 3 with IVUS guidance. 2. Successful PCI of the LCX/OM1 with long DES x 1. Plan: DAPT indefinitely due to extensive nature of stents. Will monitor overnight and anticipate DC in am. We did not treat the PDA lesion as this vessel is small in caliber and diffusely diseased. I would manage this medically.    Cardiac Studies   Diagnostic Cath 02/12/20 1.  Widely patent left main stent with no stenosis 2.  Patent RCA with mild nonobstructive disease, but severe stenosis of the PDA is present 3.  Severe diffuse stenosis of the left circumflex extending into an obtuse marginal branch (long lesion) 4.  Severe diffuse mid LAD stenosis and moderate distal LAD stenosis 5.  Moderate segmental LV systolic dysfunction with normal LVEDP  With diabetes and three-vessel coronary artery disease, will place cardiac surgical consultation.  The patient's coronary anatomy would also be amenable to multivessel PCI with treatment of the PDA lesion, left circumflex lesion, and mid LAD lesion.  Recommend heart team approach to his care with outpatient surgical consultation as next step.  PCI 02/20/20   Dist LAD lesion is 70% stenosed.  A drug-eluting stent was  successfully placed using a STENT RESOLUTE ONYX 2.5X38.  Post intervention, there is a 0% residual stenosis.  Prox LAD to Mid LAD lesion is 95% stenosed.  A drug-eluting stent was successfully placed using a STENT RESOLUTE NWGN5.6O13.  A drug-eluting stent was successfully placed using a STENT RESOLUTE ONYX 3.0X15.  Post intervention, there is a 0% residual stenosis.  Prox Cx to Mid Cx lesion is 90% stenosed.  A drug-eluting stent  was successfully placed using a SYNERGY XD 2.50X48.  Post intervention, there is a 0% residual stenosis.   1. Successful complex PCI of the proximal to distal LAD with orbital atherectomy and DES x 3 with IVUS guidance. 2. Successful PCI of the LCX/OM1 with long DES x 1.  Plan: DAPT indefinitely due to extensive nature of stents. Will monitor overnight and anticipate DC in am. We did not treat the PDA lesion as this vessel is small in caliber and diffusely diseased. I would manage this medically.     Patient Profile     49 y.o. male with uncontrolled DM, HTN, HLD, R popliteal vein DVT dx 10/2019 (Eliquis started), chronic systolic CHF (EF 82-95% in 10/2019 in context of Covid-19 PNA, MSSA sepsis, foot abscess, cerebritis -> subsequent improvement to 50-55% prior to that DC), asthma, IBS, gout. He was diagnosed with transient cardiomyopathy in September as above. He underwent f/u stress test 12/2019 which was high risk with recurrently reduced LVEF 34%. He was brought in for diagnostic cardiac cath 02/12/20 which showed 3V CAD including severe stenosis of the PDA, LCx, and mLAD with moderate distal LAD stenosis.  He was referred for outpatient CABG evaluation but deemed not a candidate given deconditioning from recent COVID PNA. He was subsequently brought back for PCI.  Assessment & Plan    1. CAD with planned PCI this admission - s/p successful complex PCI of the proximal to distal LAD with orbital atherectomy and DES x 3 with IVUS guidance, successful PCI  of the LCX/OM1 with long DES x 1. PDA lesion not stented as vessel is small in caliber and diffusely diseased -> med rx - recommended indefinite DAPT due to extensive nature of stents  2. Ischemic cardiomyopathy/chronic systolic CHF - most recent EF trends 30-35% 10/12/19, 50-55% 10/24/19, 34% by nuc 12/2019 and then most recent EF 40-45% by diagnostic cath 02/12/20 - will need future LV function reassessment once medically optimized - continue carvedilol, Imdur, losartan -> given persistent hypokalemia and low Mg will stop chlorthalidone and discuss further plans with MD as he may be suitable for spironolactone instead - Lasix was written this AM but will discuss plans with MD following lyte repletion. Given mild orthopnea may need to focus more on loop diuretic first  3. Hypokalemia/hypomagnesemia - K remains low at 3.3 despite KCl x 5 (!) yesterday - suspect in part related to hyopmagnesemia as well as Mg low at 1.4 as well as chlorthalidone additive effects with Lasix - pharmD has already written for KCl x 3, 4g mag sulfate  - will stop chlorthalidone given electrolyte abnormalities - spironolactone would be a more suitable alternative given LV dysfunction  4. Brief atrial arrhythmia yesterday (11 beats) - irregular, but looks like brief SVT, then a sinus beat, then brief SVT - no obvious sustained AF/AFL - replete lytes and continue BB - continue to monitor on teel  5. Hyperlipidemia - continue titrated dose of atorvastatin - if the patient is tolerating statin dose at time of follow-up appointment, would consider rechecking liver function/lipid panel in 6-8 weeks.  6. History of DVT in right popliteal vein - completed the recommended 3 months of Eliquis on 12/17/20201  7. Mild anemia, normocytic  - Hgb trends have been all over the place, 9-10 when ill in Sept 2021, then 14.8 and 13.3 as OP - currently in mid 11 range - no bleeding reported - f/u Hgb and anemia panel in  AM  8. DM - continue  Lantus - add SSI - can consider Jardiance in the near future, may make sense to add at a future time in case of any medication reactions   For questions or updates, please contact [facility] Please consult [email or web address] for contact info under Cardiology/STEMI.  Signed, Laurann Montana, PA-C 02/22/2020, 7:57 AM    Patient seen and examined. Agree with assessment and plan.  No chest pain presently but had a mild transient smothering sensation earlier this morning.  He continues to have significant electrolyte disarray with hypomagnesemia contributing to persistent hypokalemia.  We will magnesium replete with 4 g IV.  LV dysfunction, recommend discontinuance of chlorthalidone and in its place initiate spironolactone.  As blood pressure allows, plan to further titrate carvedilol to 12.5 mg twice a day but since blood pressure is somewhat low today will not increase today.  Continue concomitant medical therapy for CAD.  DAPT indefinitely.  We will keep in hospital today with possible discharge tomorrow.   Lennette Bihari, MD, [facility] 02/22/2020 8:56 AM

## 2020-02-22 NOTE — Progress Notes (Signed)
CARDIAC REHAB PHASE I   PRE:  Rate/Rhythm: 90 SR  BP:  Supine:   Sitting: 111/69  Standing:    SaO2: 97%RA  MODE:  Ambulation: 400 ft   POST:  Rate/Rhythm: 125 ST  BP:  Supine:   Sitting: 134/85  Standing:    SaO2: 99%RA 0807-047 Pt walked 400 ft on RA with gait belt use, rolling walker and asst x 1. Put on shoes first to assist with walk. Gait fairly steady. No CP. Stated he is less short of breath than prior to procedure. Not as smothered feeling walking as lying down. No c/o CP. Requested that CRP 2 be switched to [facility] due to transportation availability.  To bed with bed alarm on.   Luetta Nutting, RN BSN  02/22/2020 8:44 AM

## 2020-02-22 NOTE — Progress Notes (Signed)
Heart Failure Stewardship Pharmacist Progress Note   PCP: [name], MD PCP-Cardiologist: [name], MD    HPI:  49 yo M with PMH of DM, CHF, and CAD. He presented to outpatient cardiology for follow up of abnormal stress test. Underwent cardiac cath that showed severe stenosis of LCx and mLAD and then underwent outpatient CABG evaluation but was not deemed to be a surgical candidate. He was brought back to the hospital for cath and PCI. His last ECHO was done on 8/19/21and LVEF was 30-35%. LVEF was 34% on nuclear stress test on 01/10/20.  Current HF Medications: Carvedilol 6.25 mg BID Losartan 12.5 mg daily Spironolactone 12.5 mg daily  Imdur 30 mg daily  Prior to admission HF Medications: Furosemide 20 mg daily Carvedilol 3.125 mg BID Losartan 12.5 mg BID Imdur 30 mg daily  Pertinent Lab Values: . Serum creatinine 0.82, BUN 10, Potassium 3.3, Sodium 135  Vital Signs: . Weight: 233 lbs (admission weight: 230 lbs) . Blood pressure: 100-110s/70s . Heart rate: 90s  Medication Assistance / Insurance Benefits Check: Does the patient have prescription insurance?  Yes Type of insurance plan: [place] Medicaid  Outpatient Pharmacy:  Prior to admission outpatient pharmacy: [facility] Is the patient willing to use [facility] TOC pharmacy at discharge? Pending Is the patient willing to transition their outpatient pharmacy to utilize a [facility] outpatient pharmacy?   Pending    Assessment: 1. Chronic systolic CHF (EF 01-75%), due to ICM. NYHA class II symptoms. - Hold furosemide - due to electrolyte imbalance  - Continue carvedilol 6.25 mg BID - can considering increasing dose once BP allows  - Continue losartan 12.5 mg daily - can considering switching to Entresto eventually once BP allows  - Continue spironolactone 12.5 mg daily  - Consider adding Farxiga 10 mg daily prior to discharge to optimize HF regimen - Continue Imdur 30 mg daily   Plan: 1) Medication changes  recommended at this time: - Continue current regimen   2) Patient assistance application(s): - None pending; patient has Medicaid (copay $0-3 per prescription) - Marcelline Deist would require prior authorization but I can complete this if it's added  3)  Education  - To be completed prior to discharge  Theodis Sato, PharmD PGY-1 Community Pharmacy Resident   02/22/2020 2:49 PM  Sharen Hones, PharmD, BCPS Heart Failure Stewardship Pharmacist Phone [phone]

## 2020-02-23 DIAGNOSIS — I10 Essential (primary) hypertension: Secondary | ICD-10-CM | POA: Diagnosis not present

## 2020-02-23 DIAGNOSIS — Z955 Presence of coronary angioplasty implant and graft: Secondary | ICD-10-CM | POA: Diagnosis not present

## 2020-02-23 DIAGNOSIS — I25118 Atherosclerotic heart disease of native coronary artery with other forms of angina pectoris: Secondary | ICD-10-CM | POA: Diagnosis not present

## 2020-02-23 DIAGNOSIS — I5022 Chronic systolic (congestive) heart failure: Secondary | ICD-10-CM | POA: Diagnosis not present

## 2020-02-23 LAB — BASIC METABOLIC PANEL
Anion gap: 9 (ref 5–15)
BUN: 7 mg/dL (ref 6–20)
CO2: 27 mmol/L (ref 22–32)
Calcium: 9.4 mg/dL (ref 8.9–10.3)
Chloride: 99 mmol/L (ref 98–111)
Creatinine, Ser: 0.73 mg/dL (ref 0.61–1.24)
GFR, Estimated: >60 mL/min (ref >60)
Glucose, Bld: 142 mg/dL — ABNORMAL HIGH (ref 70–99)
Potassium: 4 mmol/L (ref 3.5–5.1)
Sodium: 135 mmol/L (ref 135–145)

## 2020-02-23 LAB — IRON AND TIBC
Iron: 36 ug/dL — ABNORMAL LOW (ref 45–182)
Saturation Ratios: 10 % — ABNORMAL LOW (ref 17.9–39.5)
TIBC: 349 ug/dL (ref 250–450)
UIBC: 313 ug/dL

## 2020-02-23 LAB — RETICULOCYTES
Immature Retic Fract: 21.3 % — ABNORMAL HIGH (ref 2.3–15.9)
RBC.: 3.69 MIL/uL — ABNORMAL LOW (ref 4.22–5.81)
Retic Count, Absolute: 97 10*3/uL (ref 19.0–186.0)
Retic Ct Pct: 2.6 % (ref 0.4–3.1)

## 2020-02-23 LAB — CBC
HCT: 31.7 % — ABNORMAL LOW (ref 39.0–52.0)
Hemoglobin: 11.2 g/dL — ABNORMAL LOW (ref 13.0–17.0)
MCH: 30.4 pg (ref 26.0–34.0)
MCHC: 35.3 g/dL (ref 30.0–36.0)
MCV: 86.1 fL (ref 80.0–100.0)
Platelets: 291 10*3/uL (ref 150–400)
RBC: 3.68 MIL/uL — ABNORMAL LOW (ref 4.22–5.81)
RDW: 13.6 % (ref 11.5–15.5)
WBC: 10.6 10*3/uL — ABNORMAL HIGH (ref 4.0–10.5)
nRBC: 0 % (ref 0.0–0.2)

## 2020-02-23 LAB — GLUCOSE, CAPILLARY
Glucose-Capillary: 158 mg/dL — ABNORMAL HIGH (ref 70–99)
Glucose-Capillary: 165 mg/dL — ABNORMAL HIGH (ref 70–99)
Glucose-Capillary: 182 mg/dL — ABNORMAL HIGH (ref 70–99)
Glucose-Capillary: 110 mg/dL — ABNORMAL HIGH (ref 70–99)

## 2020-02-23 LAB — MAGNESIUM: Magnesium: 1.6 mg/dL — ABNORMAL LOW (ref 1.7–2.4)

## 2020-02-23 LAB — FERRITIN: Ferritin: 216 ng/mL (ref 24–336)

## 2020-02-23 LAB — VITAMIN B12: Vitamin B-12: 346 pg/mL (ref 180–914)

## 2020-02-23 LAB — FOLATE: Folate: 11 ng/mL (ref >5.9)

## 2020-02-23 MED ORDER — MAGNESIUM SULFATE 4 GM/100ML IV SOLN
4.0000 g | Freq: Once | INTRAVENOUS | Status: AC
  Administered 2020-02-23: 4 g via INTRAVENOUS
  Filled 2020-02-23: qty 100

## 2020-02-23 MED ORDER — SPIRONOLACTONE 25 MG PO TABS
25.0000 mg | ORAL_TABLET | Freq: Every day | ORAL | Status: DC
  Administered 2020-02-24 – 2020-02-25 (×2): 25 mg via ORAL
  Filled 2020-02-23 (×2): qty 1

## 2020-02-23 MED ORDER — SPIRONOLACTONE 12.5 MG HALF TABLET
12.5000 mg | ORAL_TABLET | Freq: Once | ORAL | Status: AC
  Administered 2020-02-23: 12.5 mg via ORAL
  Filled 2020-02-23: qty 1

## 2020-02-23 MED ORDER — ROSUVASTATIN CALCIUM 20 MG PO TABS
20.0000 mg | ORAL_TABLET | Freq: Every day | ORAL | Status: DC
  Administered 2020-02-24 – 2020-02-25 (×2): 20 mg via ORAL
  Filled 2020-02-23 (×2): qty 1

## 2020-02-23 NOTE — Progress Notes (Signed)
CARDIAC REHAB PHASE I   PRE:  Rate/Rhythm: 105 ST    BP: sitting 109/69    SaO2: 97 RA  MODE:  Ambulation: 520 ft   POST:  Rate/Rhythm: 129 ST    BP: sitting 124/90     SaO2: 97 RA  Pt eager to ambulate. Donned his shoes, assist with socks. Stood independently and walked with RW and gait belt. Slow and steady pace, HR up to 129 ST (significant artifact when walking). Denied SOB. Return to bed as he does not like recliner. Encouraged more walking. [id number]   Harriet Masson CES, ACSM 02/23/2020 1:56 PM

## 2020-02-23 NOTE — Progress Notes (Addendum)
Progress Note  Patient Name: Micheal Bell Date of Encounter: 02/23/2020  Primary Cardiologist: [name], MD  Subjective   C/o joint stiffness this AM in right elbow and left knee similar to when he was on Lipitor previously. WBC mildly elevated but no fevers or chills. Ambulated to the bathroom without any CP, SOB, dizziness.  Inpatient Medications    Scheduled Meds: . allopurinol  300 mg Oral Daily  . aspirin EC  81 mg Oral Daily  . atorvastatin  80 mg Oral Daily  . buPROPion  150 mg Oral q morning - 10a  . carvedilol  6.25 mg Oral BID  . clopidogrel  75 mg Oral Daily  . enoxaparin (LOVENOX) injection  40 mg Subcutaneous Q24H  . fluticasone  1 spray Each Nare Daily  . gabapentin  300 mg Oral TID  . insulin aspart  0-15 Units Subcutaneous TID WC  . insulin glargine  20 Units Subcutaneous QPM  . isosorbide mononitrate  30 mg Oral Daily  . levETIRAcetam  500 mg Oral BID  . losartan  12.5 mg Oral Daily  . mometasone-formoterol  2 puff Inhalation BID  . montelukast  10 mg Oral QHS  . nortriptyline  25 mg Oral QHS  . pantoprazole  40 mg Oral Daily  . sodium chloride flush  3 mL Intravenous Q12H  . spironolactone  12.5 mg Oral Daily   Continuous Infusions: . sodium chloride    . magnesium sulfate bolus IVPB     PRN Meds: sodium chloride, acetaminophen, albuterol, cyclobenzaprine, HYDROcodone-acetaminophen, nitroGLYCERIN, ondansetron (ZOFRAN) IV, sodium chloride flush, zolpidem   Vital Signs    Vitals:   02/22/20 0951 02/22/20 2046 02/23/20 0620 02/23/20 0724  BP: 119/71 102/73 105/81   Pulse: 92 88 85 100  Resp:  15 14 15   Temp:  97.9 F (36.6 C) 97.9 F (36.6 C)   TempSrc:  Oral Oral   SpO2:  94% 97% 97%  Weight:   106.1 kg   Height:        Intake/Output Summary (Last 24 hours) at 02/23/2020 0846 Last data filed at 02/23/2020 0600 Gross per 24 hour  Intake 460 ml  Output 700 ml  Net -240 ml   Last 3 Weights 02/23/2020 02/22/2020 02/21/2020   Weight (lbs) 234 lb 233 lb 9.6 oz 231 lb 12.8 oz  Weight (kg) 106.142 kg 105.96 kg 105.144 kg     Telemetry    NSR/borderline sinus tach when up moving about. General HR trend 80s-90s - Personally Reviewed  Physical Exam   GEN: No acute distress.  HEENT: Normocephalic, atraumatic, sclera non-icteric. Neck: No JVD or bruits. Cardiac: RRR no murmurs, rubs, or gallops.  Radials/DP/PT 1+ and equal bilaterally.  Respiratory: Clear to auscultation bilaterally. Breathing is unlabored. GI: Soft, nontender, non-distended, BS +x 4. MS: no deformity. Left elbow and right knee without any effusions, erythema or warmth Extremities: No clubbing or cyanosis. No edema. Distal pedal pulses are 2+ and equal bilaterally. Right radial cath site without hematoma or ecchymosis; good pulse. Neuro:  AAOx3. Follows commands. Psych:  Responds to questions appropriately with a normal affect.  Labs    High Sensitivity Troponin:  No results for input(s): TROPONINIHS in the last 720 hours.    Cardiac EnzymesNo results for input(s): TROPONINI in the last 168 hours. No results for input(s): TROPIPOC in the last 168 hours.   Chemistry Recent Labs  Lab 02/21/20 1528 02/22/20 0206 02/23/20 0146  NA 134* 135 135  K  3.1* 3.3* 4.0  CL 96* 98 99  CO2 28 28 27   GLUCOSE 191* 184* 142*  BUN 9 10 7   CREATININE 0.75 0.82 0.73  CALCIUM 9.1 9.1 9.4  GFRNONAA >60 >60 >60  ANIONGAP 10 9 9      Hematology Recent Labs  Lab 02/20/20 1406 02/21/20 0254 02/23/20 0146  WBC 8.3 9.8 10.6*  RBC 4.07* 3.89* 3.68*  3.69*  HGB 11.7* 11.4* 11.2*  HCT 35.2* 33.3* 31.7*  MCV 86.5 85.6 86.1  MCH 28.7 29.3 30.4  MCHC 33.2 34.2 35.3  RDW 13.3 13.4 13.6  PLT 316 281 291    BNPNo results for input(s): BNP, PROBNP in the last 168 hours.   DDimer No results for input(s): DDIMER in the last 168 hours.   Radiology    No results found.  Cardiac Studies   Diagnostic Cath 02/12/20 1. Widely patent left main stent  with no stenosis 2. Patent RCA with mild nonobstructive disease, but severe stenosis of the PDA is present 3. Severe diffuse stenosis of the left circumflex extending into an obtuse marginal branch (long lesion) 4. Severe diffuse mid LAD stenosis and moderate distal LAD stenosis 5. Moderate segmental LV systolic dysfunction with normal LVEDP  With diabetes and three-vessel coronary artery disease, will place cardiac surgical consultation. The patient's coronary anatomy would also be amenable to multivessel PCI with treatment of the PDA lesion, left circumflex lesion, and mid LAD lesion. Recommend heart team approach to his care with outpatient surgical consultation as next step.  PCI 02/20/20   Dist LAD lesion is 70% stenosed.  A drug-eluting stent was successfully placed using a STENT RESOLUTE ONYX 2.5X38.  Post intervention, there is a 0% residual stenosis.  Prox LAD to Mid LAD lesion is 95% stenosed.  A drug-eluting stent was successfully placed using a STENT RESOLUTE 02/25/20.  A drug-eluting stent was successfully placed using a STENT RESOLUTE ONYX 3.0X15.  Post intervention, there is a 0% residual stenosis.  Prox Cx to Mid Cx lesion is 90% stenosed.  A drug-eluting stent was successfully placed using a SYNERGY XD 2.50X48.  Post intervention, there is a 0% residual stenosis.  1. Successful complex PCI of the proximal to distal LAD with orbital atherectomy and DES x 3 with IVUS guidance. 2. Successful PCI of the LCX/OM1 with long DES x 1.  Plan: DAPT indefinitely due to extensive nature of stents. Will monitor overnight and anticipate DC in am. We did not treat the PDA lesion as this vessel is small in caliber and diffusely diseased. I would manage this medically.   Patient Profile     49 y.o. male with uncontrolled DM, HTN, HLD, R popliteal vein DVT dx 10/2019 (Eliquis started), chronic systolic CHF (EF CLEX5.1Z00 in 10/2019 in context of Covid-19 PNA, MSSA sepsis,  foot abscess, cerebritis -> subsequent improvement to 50-55% prior to that DC), asthma, IBS, gout. He was diagnosed with transient cardiomyopathy in September as above. He underwent f/u stress test 12/2019 which was high risk with recurrently reduced LVEF 34%. He was brought in for diagnostic cardiac cath 02/12/20 which showed 3V CAD including severe stenosis of the PDA, LCx, and mLAD with moderate distal LAD stenosis.  He was referred for outpatient CABG evaluation but deemed not a candidate given deconditioning from recent COVID PNA. He was subsequently brought back for PCI.  Assessment & Plan    1. CAD with planned PCI this admission - s/p successful complex PCI of the proximal to distal LAD with orbital  atherectomy and DES x 3 with IVUS guidance, successful PCI of the LCX/OM1 with long DES x 1. PDA lesion not stented as vessel is small in caliber and diffusely diseased -> med rx - recommended indefinite DAPT due to extensive nature of stents - continue BB, Imdur, and see below re: statin  2. Ischemic cardiomyopathy/chronic systolic CHF - most recent EF trends 30-35% 10/12/19, 50-55% 10/24/19, 34% by nuc 12/2019 and then most recent EF 40-45% by diagnostic cath 02/12/20 - will need future LV function reassessment once medically optimized - continue carvedilol, Imdur, losartan - would not yet change to Entresto due to tendency for soft BP - chlorthalidone stopped yesterday due to hypokalemia/hypomagnesemia and switched to spironolactone 12.5mg  daily - will discuss diuretic with MD   3. Hypokalemia/hypomagnesemia - s/p aggressive repletion ( x 5 on 12/30 and x 3 on 12/31, 4g mag yesterday) - pharmD put in for another 4g of mag today - suspect this was exacerbated by chlorthalidone and hopefully should improve, but consider oral supplementation depending on plan for oral dosing  4. Brief atrial arrhythmia on 12/29 (11 beats) - irregular, but looks like brief SVT, then a sinus beat,  then brief SVT - no obvious sustained AF/AFL - improved with lyte repletion - tele stable  5. Hyperlipidemia - recurrent myalgias with atorvastatin - switch to Crestor - choose moderate dose 20mg  to ensure he tolerates this - if the patient is tolerating statin dose at time of follow-up appointment, would consider rechecking liver function/lipid panel in 6-8 weeks  6. History of DVT in right popliteal vein - completed the recommended 3 months of Eliquis on 12/17/20201  7. Mild anemia, normocytic  - Hgb trends have been all over the place, 9-10 when ill in Sept 2021, then 14.8 and 13.3 as OP - currently in mid 11 range with slight downtrend - anemia panel shows low iron, normal ferritin, and low percent saturation - no bleeding reported - will review with MD - ? Iron supp - FOBT ordered but no stool yet  8. DM - continue Lantus - add SSI - can consider Jardiance as OP - would not start inpatient at this time to allow time to prove tolerance to all the other new med changes  9. Mild leukocytosis - afebrile, no infective sx - some stiffness of joints after restart of statin  For questions or updates, please contact [facility] Please consult [email or web address] for contact info under Cardiology/STEMI.  Signed, 11-24-1999, PA-C 02/23/2020, 8:46 AM

## 2020-02-23 NOTE — Progress Notes (Signed)
Heart Failure Stewardship Pharmacist Progress Note   PCP: [name], MD PCP-Cardiologist: [name], MD    HPI:  49 yo M with PMH of DM, CHF, and CAD. He presented to outpatient cardiology for follow up of abnormal stress test. Underwent cardiac cath that showed severe stenosis of LCx and mLAD and then underwent outpatient CABG evaluation but was not deemed to be a surgical candidate. He was brought back to the hospital for cath and PCI. His last ECHO was done on 8/19/21and LVEF was 30-35%. LVEF was 34% on nuclear stress test on 01/10/20.  Current HF Medications: Carvedilol 6.25 mg BID Losartan 12.5 mg daily Spironolactone 25 mg daily  Imdur 30 mg daily  Prior to admission HF Medications: Furosemide 20 mg daily Carvedilol 3.125 mg BID Losartan 12.5 mg BID Imdur 30 mg daily  Pertinent Lab Values: . Serum creatinine 0.73, BUN 7, Potassium 4.0, Sodium 135  Vital Signs: . Weight: 234 lbs (admission weight: 230 lbs) . Blood pressure: 100-110s/80s . Heart rate: 90-100s  Medication Assistance / Insurance Benefits Check: Does the patient have prescription insurance?  Yes Type of insurance plan: [place] Medicaid  Outpatient Pharmacy:  Prior to admission outpatient pharmacy: [facility] Is the patient willing to use [facility] TOC pharmacy at discharge? Pending Is the patient willing to transition their outpatient pharmacy to utilize a [facility] outpatient pharmacy?   Pending    Assessment: 1. Chronic systolic CHF (EF 27-74%), due to ICM. NYHA class II symptoms. - Furosemide held with electrolyte abnormalities, K up to 4.0 today. No JVD or edema noted on provider exam but weight is up 4 lbs since admission. Will need to watch closely. - Continue carvedilol 6.25 mg BID - Continue losartan 12.5 mg daily - can considering switching to Entresto eventually once BP allows  - Spironolactone increased to 25 mg daily  - Note cardiology would like to wait until after discharge to  start SGLT2i to ensure compliance and tolerability with current med changes - Continue Imdur 30 mg daily   Plan: 1) Medication changes recommended at this time: - Continue current regimen   2) Patient assistance application(s): - None pending; patient has Medicaid (copay $0-3 per prescription) - Marcelline Deist will require prior authorization  3)  Education  - To be completed prior to discharge  Sharen Hones, PharmD, BCPS Heart Failure Stewardship Pharmacist Phone [phone]

## 2020-02-23 NOTE — Progress Notes (Addendum)
Progress Note  Patient Name: Micheal Bell Date of Encounter: 02/23/2020  Primary Cardiologist: [name], MD  Subjective   Denies any chest pain or SOB  Inpatient Medications    Scheduled Meds: . allopurinol  300 mg Oral Daily  . aspirin EC  81 mg Oral Daily  . atorvastatin  80 mg Oral Daily  . buPROPion  150 mg Oral q morning - 10a  . carvedilol  6.25 mg Oral BID  . clopidogrel  75 mg Oral Daily  . enoxaparin (LOVENOX) injection  40 mg Subcutaneous Q24H  . fluticasone  1 spray Each Nare Daily  . gabapentin  300 mg Oral TID  . insulin aspart  0-15 Units Subcutaneous TID WC  . insulin glargine  20 Units Subcutaneous QPM  . isosorbide mononitrate  30 mg Oral Daily  . levETIRAcetam  500 mg Oral BID  . losartan  12.5 mg Oral Daily  . mometasone-formoterol  2 puff Inhalation BID  . montelukast  10 mg Oral QHS  . nortriptyline  25 mg Oral QHS  . pantoprazole  40 mg Oral Daily  . potassium chloride SA  40 mEq Oral TID  . sodium chloride flush  3 mL Intravenous Q12H  . spironolactone  12.5 mg Oral Daily   Continuous Infusions: . sodium chloride     PRN Meds: sodium chloride, acetaminophen, albuterol, cyclobenzaprine, HYDROcodone-acetaminophen, nitroGLYCERIN, ondansetron (ZOFRAN) IV, sodium chloride flush, zolpidem   Vital Signs    Vitals:   02/22/20 0500 02/22/20 0609 02/22/20 0951 02/22/20 2046  BP:  109/69 119/71 102/73  Pulse:  90 92 88  Resp:  18  15  Temp:  98.6 F (37 C)  97.9 F (36.6 C)  TempSrc:  Oral  Oral  SpO2:  99%  94%  Weight:  106 kg    Height: 6\' 2"  (1.88 m)       Intake/Output Summary (Last 24 hours) at 02/23/2020 0015 Last data filed at 02/22/2020 2200 Gross per 24 hour  Intake 580 ml  Output 700 ml  Net -120 ml   Last 3 Weights 02/22/2020 02/21/2020 02/20/2020  Weight (lbs) 233 lb 9.6 oz 231 lb 12.8 oz 230 lb  Weight (kg) 105.96 kg 105.144 kg 104.327 kg     Telemetry    NSR- Personally Reviewed  Physical Exam   GEN:  Well nourished, well developed in no acute distress HEENT: Normal NECK: No JVD; No carotid bruits LYMPHATICS: No lymphadenopathy CARDIAC:RRR, no murmurs, rubs, gallops RESPIRATORY:  Clear to auscultation without rales, wheezing or rhonchi  ABDOMEN: Soft, non-tender, non-distended MUSCULOSKELETAL:  No edema; No deformity  SKIN: Warm and dry NEUROLOGIC:  Alert and oriented x 3 PSYCHIATRIC:  Normal affect    Labs    High Sensitivity Troponin:  No results for input(s): TROPONINIHS in the last 720 hours.    Cardiac EnzymesNo results for input(s): TROPONINI in the last 168 hours. No results for input(s): TROPIPOC in the last 168 hours.   Chemistry Recent Labs  Lab 02/21/20 0254 02/21/20 1528 02/22/20 0206  NA 136 134* 135  K 2.6* 3.1* 3.3*  CL 95* 96* 98  CO2 27 28 28   GLUCOSE 180* 191* 184*  BUN 9 9 10   CREATININE 0.79 0.75 0.82  CALCIUM 9.2 9.1 9.1  GFRNONAA >60 >60 >60  ANIONGAP 14 10 9      Hematology Recent Labs  Lab 02/20/20 1406 02/21/20 0254  WBC 8.3 9.8  RBC 4.07* 3.89*  HGB 11.7* 11.4*  HCT  35.2* 33.3*  MCV 86.5 85.6  MCH 28.7 29.3  MCHC 33.2 34.2  RDW 13.3 13.4  PLT 316 281    BNPNo results for input(s): BNP, PROBNP in the last 168 hours.   DDimer No results for input(s): DDIMER in the last 168 hours.   Radiology    No results found.  Cardiac Studies   Diagnostic Cath 02/12/20 1.  Widely patent left main stent with no stenosis 2.  Patent RCA with mild nonobstructive disease, but severe stenosis of the PDA is present 3.  Severe diffuse stenosis of the left circumflex extending into an obtuse marginal branch (long lesion) 4.  Severe diffuse mid LAD stenosis and moderate distal LAD stenosis 5.  Moderate segmental LV systolic dysfunction with normal LVEDP  With diabetes and three-vessel coronary artery disease, will place cardiac surgical consultation.  The patient's coronary anatomy would also be amenable to multivessel PCI with treatment of the  PDA lesion, left circumflex lesion, and mid LAD lesion.  Recommend heart team approach to his care with outpatient surgical consultation as next step.  PCI 02/20/20   Dist LAD lesion is 70% stenosed.  A drug-eluting stent was successfully placed using a STENT RESOLUTE ONYX 2.5X38.  Post intervention, there is a 0% residual stenosis.  Prox LAD to Mid LAD lesion is 95% stenosed.  A drug-eluting stent was successfully placed using a STENT RESOLUTE ZYSA6.3K16.  A drug-eluting stent was successfully placed using a STENT RESOLUTE ONYX 3.0X15.  Post intervention, there is a 0% residual stenosis.  Prox Cx to Mid Cx lesion is 90% stenosed.  A drug-eluting stent was successfully placed using a SYNERGY XD 2.50X48.  Post intervention, there is a 0% residual stenosis.   1. Successful complex PCI of the proximal to distal LAD with orbital atherectomy and DES x 3 with IVUS guidance. 2. Successful PCI of the LCX/OM1 with long DES x 1.  Plan: DAPT indefinitely due to extensive nature of stents. Will monitor overnight and anticipate DC in am. We did not treat the PDA lesion as this vessel is small in caliber and diffusely diseased. I would manage this medically.     Patient Profile     49 y.o. male with uncontrolled DM, HTN, HLD, R popliteal vein DVT dx 10/2019 (Eliquis started), chronic systolic CHF (EF 01-09% in 10/2019 in context of Covid-19 PNA, MSSA sepsis, foot abscess, cerebritis -> subsequent improvement to 50-55% prior to that DC), asthma, IBS, gout. He was diagnosed with transient cardiomyopathy in September as above. He underwent f/u stress test 12/2019 which was high risk with recurrently reduced LVEF 34%. He was brought in for diagnostic cardiac cath 02/12/20 which showed 3V CAD including severe stenosis of the PDA, LCx, and mLAD with moderate distal LAD stenosis.  He was referred for outpatient CABG evaluation but deemed not a candidate given deconditioning from recent COVID PNA. He was  subsequently brought back for PCI.  Assessment & Plan    1. ASCAD -stress test 12/2019 high risk with recurrently reduced LVEF 34%.  -diagnostic cardiac cath 02/12/20 showed 3V CAD including severe stenosis of the PDA, LCx, and mLAD with moderate distal LAD stenosis.   -He was referred for outpatient CABG evaluation but deemed not a candidate given deconditioning from recent COVID PNA.   -now s/p successful complex PCI of the proximal to distal LAD with orbital atherectomy and DES x 3  with IVUS guidance, successful PCI of the LCX/OM1 with long DES x 1. PDA lesion not stented as  vessel is small in caliber and diffusely diseased -> med rx -recommended indefinite DAPT due to extensive nature of stents -denies any anginal CP -continue ASA 81mg  daily, Plavix 75mg  daily, Imdur 30mg  daily, high dose statin and BB  2. Ischemic cardiomyopathy/chronic systolic CHF - most recent EF trends 30-35% 10/12/19, 50-55% 10/24/19, 34% by nuc 12/2019 and then most recent EF 40-45% by diagnostic cath 02/12/20 - will need future LV function reassessment once medically optimized - continue carvedilol, Imdur, losartan -> given persistent hypokalemia and low Mg, chlorthalidone was stopped and started on Spiro 12.5mg  daily -increase spiro to 25mg  daily  3. Hypokalemia/hypomagnesemia -aggressively repleted yesterday -K+ 4 and Mag 1.6 today -replete mag again today -should improve with suppl and stopping chlorthalidone - continue K+ sparing diuretic with spiro and increase to 25mg  daily to help maintain normal K+ -will keep one more day to make sure that lytes are normal  4. Brief atrial arrhythmia (11 beats) -no arrhythmias in the last 24 hours - continue BB  5. Hyperlipidemia -currently on high dose atorva but now complaining of joint pain -change atorva to Crestor 20mg  daily -will need FLP and ALT in 6 weeks - if he does not tolerate statin will need to consider PCSK9i  6. History of DVT in right  popliteal vein - completed the recommended 3 months of Eliquis on 12/17/20201  7. Mild anemia, normocytic  - Hgb trends have been all over the place, 9-10 when ill in Sept 2021, then 14.8 and 13.3 as OP - currently in mid 11 range - no bleeding reported  8. DM - continue Lantus and SSI - can consider Jardiance in the near future, may make sense to add at a future time in case of any medication reactions   For questions or updates, please contact [facility] Please consult [email or web address] for contact info under Cardiology/STEMI.  Signed, 02/14/20, MD 02/23/2020,09:21am

## 2020-02-23 NOTE — Plan of Care (Signed)
  Problem: Education: Goal: Understanding of CV disease, CV risk reduction, and recovery process will improve Outcome: Progressing Goal: Individualized Educational Video(s) Outcome: Progressing   

## 2020-02-24 DIAGNOSIS — I25118 Atherosclerotic heart disease of native coronary artery with other forms of angina pectoris: Secondary | ICD-10-CM | POA: Diagnosis not present

## 2020-02-24 LAB — GLUCOSE, CAPILLARY
Glucose-Capillary: 124 mg/dL — ABNORMAL HIGH (ref 70–99)
Glucose-Capillary: 156 mg/dL — ABNORMAL HIGH (ref 70–99)
Glucose-Capillary: 137 mg/dL — ABNORMAL HIGH (ref 70–99)

## 2020-02-24 LAB — CBC
HCT: 31.5 % — ABNORMAL LOW (ref 39.0–52.0)
Hemoglobin: 10.7 g/dL — ABNORMAL LOW (ref 13.0–17.0)
MCH: 29.2 pg (ref 26.0–34.0)
MCHC: 34 g/dL (ref 30.0–36.0)
MCV: 85.8 fL (ref 80.0–100.0)
Platelets: 257 10*3/uL (ref 150–400)
RBC: 3.67 MIL/uL — ABNORMAL LOW (ref 4.22–5.81)
RDW: 13.5 % (ref 11.5–15.5)
WBC: 8.6 10*3/uL (ref 4.0–10.5)
nRBC: 0 % (ref 0.0–0.2)

## 2020-02-24 LAB — BASIC METABOLIC PANEL
Anion gap: 11 (ref 5–15)
BUN: 8 mg/dL (ref 6–20)
CO2: 26 mmol/L (ref 22–32)
Calcium: 9.5 mg/dL (ref 8.9–10.3)
Chloride: 100 mmol/L (ref 98–111)
Creatinine, Ser: 0.67 mg/dL (ref 0.61–1.24)
GFR, Estimated: >60 mL/min (ref >60)
Glucose, Bld: 128 mg/dL — ABNORMAL HIGH (ref 70–99)
Potassium: 3.7 mmol/L (ref 3.5–5.1)
Sodium: 137 mmol/L (ref 135–145)

## 2020-02-24 LAB — MAGNESIUM: Magnesium: 1.7 mg/dL (ref 1.7–2.4)

## 2020-02-24 MED ORDER — CARVEDILOL 12.5 MG PO TABS
12.5000 mg | ORAL_TABLET | Freq: Two times a day (BID) | ORAL | Status: DC
  Administered 2020-02-24 – 2020-02-25 (×2): 12.5 mg via ORAL
  Filled 2020-02-24 (×2): qty 1

## 2020-02-24 NOTE — Progress Notes (Signed)
Progress Note  Patient Name: Micheal Bell Date of Encounter: 02/24/2020  Primary Cardiologist: Micheal Harps, MD   Subjective   Denies chest pain or sob.   Inpatient Medications    Scheduled Meds: . allopurinol  300 mg Oral Daily  . aspirin EC  81 mg Oral Daily  . buPROPion  150 mg Oral q morning - 10a  . carvedilol  12.5 mg Oral BID  . clopidogrel  75 mg Oral Daily  . enoxaparin (LOVENOX) injection  40 mg Subcutaneous Q24H  . fluticasone  1 spray Each Nare Daily  . gabapentin  300 mg Oral TID  . insulin aspart  0-15 Units Subcutaneous TID WC  . insulin glargine  20 Units Subcutaneous QPM  . isosorbide mononitrate  30 mg Oral Daily  . levETIRAcetam  500 mg Oral BID  . losartan  12.5 mg Oral Daily  . mometasone-formoterol  2 puff Inhalation BID  . montelukast  10 mg Oral QHS  . nortriptyline  25 mg Oral QHS  . pantoprazole  40 mg Oral Daily  . rosuvastatin  20 mg Oral Daily  . sodium chloride flush  3 mL Intravenous Q12H  . spironolactone  25 mg Oral Daily   Continuous Infusions: . sodium chloride     PRN Meds: sodium chloride, acetaminophen, albuterol, cyclobenzaprine, HYDROcodone-acetaminophen, nitroGLYCERIN, ondansetron (ZOFRAN) IV, sodium chloride flush, zolpidem   Vital Signs    Vitals:   02/23/20 2106 02/24/20 0422 02/24/20 0849 02/24/20 0920  BP: 108/73 109/74 119/78   Pulse: 98 81 (!) 104   Resp: 19 15    Temp: 98.1 F (36.7 C) (!) 97.5 F (36.4 C)    TempSrc: Oral Oral    SpO2: 95% 97%  98%  Weight:  105.9 kg    Height:        Intake/Output Summary (Last 24 hours) at 02/24/2020 1021 Last data filed at 02/24/2020 0600 Gross per 24 hour  Intake 700 ml  Output 1600 ml  Net -900 ml   Filed Weights   02/22/20 0609 02/23/20 0620 02/24/20 0422  Weight: 106 kg 106.1 kg 105.9 kg    Telemetry    nsr - Personally Reviewed  ECG    none - Personally Reviewed  Physical Exam   GEN: No acute distress.   Neck: 6 cm JVD Cardiac: RRR, no  murmurs, rubs, or gallops.  Respiratory: Clear to auscultation bilaterally. GI: Soft, nontender, non-distended  MS: No edema; No deformity. Neuro:  Nonfocal  Psych: Normal affect   Labs    Chemistry Recent Labs  Lab 02/22/20 0206 02/23/20 0146 02/24/20 0358  NA 135 135 137  K 3.3* 4.0 3.7  CL 98 99 100  CO2 28 27 26   GLUCOSE 184* 142* 128*  BUN 10 7 8   CREATININE 0.82 0.73 0.67  CALCIUM 9.1 9.4 9.5  GFRNONAA >60 >60 >60  ANIONGAP 9 9 11      Hematology Recent Labs  Lab 02/21/20 0254 02/23/20 0146 02/24/20 0358  WBC 9.8 10.6* 8.6  RBC 3.89* 3.68*  3.69* 3.67*  HGB 11.4* 11.2* 10.7*  HCT 33.3* 31.7* 31.5*  MCV 85.6 86.1 85.8  MCH 29.3 30.4 29.2  MCHC 34.2 35.3 34.0  RDW 13.4 13.6 13.5  PLT 281 291 257    Cardiac EnzymesNo results for input(s): TROPONINI in the last 168 hours. No results for input(s): TROPIPOC in the last 168 hours.   BNPNo results for input(s): BNP, PROBNP in the last 168 hours.  DDimer No results for input(s): DDIMER in the last 168 hours.   Radiology    No results found.  Cardiac Studies   none  Patient Profile     50 y.o. male admitted with complicated  Coronary anatomy, s/p PCI.   Assessment & Plan    1. CAD - he is s/p complicated PCI and is doing well. HR remains elevated. I will uptitrate his coreg today. Continue DAPT. 2. CHF - his symptoms are class 2. He will continue GDMT. Recheck echo in 3 months. 3. Sinus tachycardia - unclear why. Wonder if not related to prior Covid. I have increased his  4.Disp - probable DC home tomorrow.  For questions or updates, please contact [facility] Please consult [email or web address] for contact info under Cardiology/STEMI.   Signed, Micheal Bunting, MD  02/24/2020, 10:21 AM  Patient ID: Micheal Bell, male   DOB: [birth year], 50 y.o.   MRN: [id number]

## 2020-02-25 DIAGNOSIS — I25118 Atherosclerotic heart disease of native coronary artery with other forms of angina pectoris: Secondary | ICD-10-CM | POA: Diagnosis not present

## 2020-02-25 LAB — BASIC METABOLIC PANEL
Anion gap: 14 (ref 5–15)
BUN: 9 mg/dL (ref 6–20)
CO2: 22 mmol/L (ref 22–32)
Calcium: 9.4 mg/dL (ref 8.9–10.3)
Chloride: 99 mmol/L (ref 98–111)
Creatinine, Ser: 0.78 mg/dL (ref 0.61–1.24)
GFR, Estimated: >60 mL/min (ref >60)
Glucose, Bld: 145 mg/dL — ABNORMAL HIGH (ref 70–99)
Potassium: 3.9 mmol/L (ref 3.5–5.1)
Sodium: 135 mmol/L (ref 135–145)

## 2020-02-25 LAB — CBC
HCT: 33.7 % — ABNORMAL LOW (ref 39.0–52.0)
Hemoglobin: 11.3 g/dL — ABNORMAL LOW (ref 13.0–17.0)
MCH: 29.3 pg (ref 26.0–34.0)
MCHC: 33.5 g/dL (ref 30.0–36.0)
MCV: 87.3 fL (ref 80.0–100.0)
Platelets: 292 10*3/uL (ref 150–400)
RBC: 3.86 MIL/uL — ABNORMAL LOW (ref 4.22–5.81)
RDW: 13.6 % (ref 11.5–15.5)
WBC: 10 10*3/uL (ref 4.0–10.5)
nRBC: 0 % (ref 0.0–0.2)

## 2020-02-25 LAB — GLUCOSE, CAPILLARY
Glucose-Capillary: 139 mg/dL — ABNORMAL HIGH (ref 70–99)
Glucose-Capillary: 140 mg/dL — ABNORMAL HIGH (ref 70–99)
Glucose-Capillary: 137 mg/dL — ABNORMAL HIGH (ref 70–99)

## 2020-02-25 LAB — MAGNESIUM: Magnesium: 1.5 mg/dL — ABNORMAL LOW (ref 1.7–2.4)

## 2020-02-25 MED ORDER — ROSUVASTATIN CALCIUM 20 MG PO TABS: 20.0000 mg | ORAL_TABLET | Freq: Every day | ORAL | 11 refills | Status: DC

## 2020-02-25 MED ORDER — ACETAMINOPHEN 325 MG PO TABS: 650.0000 mg | ORAL_TABLET | ORAL | Status: DC | PRN

## 2020-02-25 MED ORDER — CARVEDILOL 12.5 MG PO TABS: 12.5000 mg | ORAL_TABLET | Freq: Two times a day (BID) | ORAL | 3 refills | Status: DC

## 2020-02-25 MED ORDER — FUROSEMIDE 20 MG PO TABS: 20.0000 mg | ORAL_TABLET | Freq: Every day | ORAL | 3 refills | Status: DC | PRN

## 2020-02-25 NOTE — Discharge Summary (Addendum)
Discharge Summary    Patient ID: Micheal Bell MRN: [id number]; DOB: May 27, 1970  Admit date: 02/20/2020 Discharge date: 02/25/2020  Primary Care Provider: [name], MD  Primary Cardiologist: [name], MD  Primary Electrophysiologist:  None   Discharge Diagnoses    Principal Problem:   Coronary artery disease with exertional angina [facility]) Active Problems:   Benign hypertension   Class 2 obesity in adult   Type 2 diabetes mellitus without complication, without long-term current use of insulin (HCC)   Chronic systolic heart failure (HCC)   Angina pectoris (HCC)   Hypokalemia   Hyperlipidemia with target LDL less than 70   Chest pain   Hypomagnesemia   Status post coronary artery stent placement   Allergies Allergies  Allergen Reactions  . Azithromycin     Blisters in mouth   . Lodine [Etodolac] Anaphylaxis    Diagnostic Studies/Procedures    CORONARY STENT INTERVENTION 02/20/2020  CORONARY ATHERECTOMY  Intravascular Ultrasound/IVUS    Dist LAD lesion is 70% stenosed.  A drug-eluting stent was successfully placed using a STENT RESOLUTE ONYX 2.5X38.  Post intervention, there is a 0% residual stenosis.  Prox LAD to Mid LAD lesion is 95% stenosed.  A drug-eluting stent was successfully placed using a STENT RESOLUTE AJOI7.8M76.  A drug-eluting stent was successfully placed using a STENT RESOLUTE ONYX 3.0X15.  Post intervention, there is a 0% residual stenosis.  Prox Cx to Mid Cx lesion is 90% stenosed.  A drug-eluting stent was successfully placed using a SYNERGY XD 2.50X48.  Post intervention, there is a 0% residual stenosis.   1. Successful complex PCI of the proximal to distal LAD with orbital atherectomy and DES x 3 with IVUS guidance. 2. Successful PCI of the LCX/OM1 with long DES x 1.  Plan: DAPT indefinitely due to extensive nature of stents. Will monitor overnight and anticipate DC in am. We did not treat the PDA lesion as this vessel is  small in caliber and diffusely diseased. I would manage this medically.  Diagnostic Dominance: Right    Intervention     _____________   History of Present Illness     Micheal Bell is a 50 y.o. male with hx of  uncontrolled DM, systolic HF, CAD, HLD, admit 09/2019 w/ Covid sepsis and PNA >> EF 30-35% on echo, improved to 50-55% on TEE, abnl MV w/ ?inf MI, LE DVT s/p Eliquis x 3 mo (dc 02/09/2020).   Cath scheduled after Eliquis completed, cath showed 3v dz, Dr Cliffton Asters rec PCI due to decreased functional status post-Covid and deconditioning.   Pt admitted 12/28 for PCI.  Hospital Course     Consultants: None   Micheal Bell was admitted after PCI.   He had hypokalemia and was supplemented. With hypokalemia, chlorthalidone d/c'd and spiro started. Mg low and was supplemented as well.   Lasix was initially held post-cath, but volume status was good. Wt 230 lbs on admit, 233 lbs at d/c. He is spiro daily, Lasix is prn. Home K+ d/c'd.   He had some CP post-cath and tachycardia w/ HR 130s. BB increased.   He had myalgias on Lipitor >> changed to Crestor.  He had L elbow discomfort, no injury or wound present, L elbow was warm to touch. Pt on home allopurinol dose and gabapentin. With pt on ASA and Plavix, will not add NSAIDs at this time. F/u with PCP if sx do not improve.  On 01/02, pt seen by Dr Ladona Ridgel and all data  reviewed. He was ambulating better and tachycardia was improved. F/u with PCP re: L elbow pain. No further inpatient workup is indicated and he is considered stable for discharge, to follow up as an outpatient.   Did the patient have an acute coronary syndrome (MI, NSTEMI, STEMI, etc) this admission?:  No                               Did the patient have a percutaneous coronary intervention (stent / angioplasty)?:  Yes.     Cath/PCI Registry Performance & Quality Measures: 1. Aspirin prescribed? - Yes 2. ADP Receptor Inhibitor (Plavix/Clopidogrel,  Brilinta/Ticagrelor or Effient/Prasugrel) prescribed (includes medically managed patients)? - Yes 3. High Intensity Statin (Lipitor 40-80mg  or Crestor 20-40mg ) prescribed? - Yes 4. For EF <40%, was ACEI/ARB prescribed? - Yes 5. For EF <40%, Aldosterone Antagonist (Spironolactone or Eplerenone) prescribed? - Yes 6. Cardiac Rehab Phase II ordered? - Yes   _____________  Discharge Vitals Blood pressure 113/68, pulse 93, temperature 97.6 F (36.4 C), temperature source Oral, resp. rate 18, height 6\' 2"  (1.88 m), weight 105.9 kg, SpO2 96 %.  Filed Weights   02/22/20 0609 02/23/20 0620 02/24/20 0422  Weight: 106 kg 106.1 kg 105.9 kg   GEN: No acute distress.   Neck: No JVD Cardiac: RRR, no murmur, no rubs, or gallops.  Respiratory: clear to auscultation bilaterally   GI: Soft, nontender, non-distended  MS: No edema; No deformity. R radial cath site w/out ecchymosis or hematoma. L elbow w/out deformity or wound, no sig edema, very warm to touch.  Neuro:  Nonfocal  Psych: Normal affect    Labs & Radiologic Studies    CBC Recent Labs    02/24/20 0358 02/25/20 0223  WBC 8.6 10.0  HGB 10.7* 11.3*  HCT 31.5* 33.7*  MCV 85.8 87.3  PLT 257 292   Basic Metabolic Panel Recent Labs    04/24/20 0358 02/25/20 0223  NA 137 135  K 3.7 3.9  CL 100 99  CO2 26 22  GLUCOSE 128* 145*  BUN 8 9  CREATININE 0.67 0.78  CALCIUM 9.5 9.4  MG 1.7 1.5*   Liver Function Tests No results for input(s): AST, ALT, ALKPHOS, BILITOT, PROT, ALBUMIN in the last 72 hours. No results for input(s): LIPASE, AMYLASE in the last 72 hours. High Sensitivity Troponin:   No results for input(s): TROPONINIHS in the last 720 hours.  BNP Invalid input(s): POCBNP D-Dimer No results for input(s): DDIMER in the last 72 hours. Hemoglobin A1C Lab Results  Component Value Date   HGBA1C 11.3 (H) 10/26/2019   Fasting Lipid Panel Lab Results  Component Value Date   CHOL 168 10/26/2019   HDL 43 10/26/2019    LDLCALC 99 10/26/2019   TRIG 130 10/26/2019   CHOLHDL 3.9 10/26/2019    Thyroid Function Tests No results for input(s): TSH, T4TOTAL, T3FREE, THYROIDAB in the last 72 hours.  Invalid input(s): FREET3 _____________  CARDIAC CATHETERIZATION  Result Date: 02/20/2020  Dist LAD lesion is 70% stenosed.  A drug-eluting stent was successfully placed using a STENT RESOLUTE ONYX 2.5X38.  Post intervention, there is a 0% residual stenosis.  Prox LAD to Mid LAD lesion is 95% stenosed.  A drug-eluting stent was successfully placed using a STENT RESOLUTE 02/22/2020.  A drug-eluting stent was successfully placed using a STENT RESOLUTE ONYX 3.0X15.  Post intervention, there is a 0% residual stenosis.  Prox Cx to Mid Cx lesion  is 90% stenosed.  A drug-eluting stent was successfully placed using a SYNERGY XD 2.50X48.  Post intervention, there is a 0% residual stenosis.  1. Successful complex PCI of the proximal to distal LAD with orbital atherectomy and DES x 3 with IVUS guidance. 2. Successful PCI of the LCX/OM1 with long DES x 1. Plan: DAPT indefinitely due to extensive nature of stents. Will monitor overnight and anticipate DC in am. We did not treat the PDA lesion as this vessel is small in caliber and diffusely diseased. I would manage this medically.   CARDIAC CATHETERIZATION  Result Date: 02/12/2020 1.  Widely patent left main stent with no stenosis 2.  Patent RCA with mild nonobstructive disease, but severe stenosis of the PDA is present 3.  Severe diffuse stenosis of the left circumflex extending into an obtuse marginal branch (long lesion) 4.  Severe diffuse mid LAD stenosis and moderate distal LAD stenosis 5.  Moderate segmental LV systolic dysfunction with normal LVEDP With diabetes and three-vessel coronary artery disease, will place cardiac surgical consultation.  The patient's coronary anatomy would also be amenable to multivessel PCI with treatment of the PDA lesion, left circumflex lesion,  and mid LAD lesion.  Recommend heart team approach to his care with outpatient surgical consultation as next step.  Disposition   Pt is being discharged home today in improved condition.  Follow-up Plans & Appointments     Follow-up Information    O'Neal, Ronnald Ramp, MD Follow up.   Specialties: Internal Medicine, Cardiology, Radiology Why: Keep 01/31 appt.  Contact information: [address] [place] [address] [phone]        [name], MD Follow up.   Specialty: Family Medicine Why: Call for appt if elbow does not improve. Contact information: [address] [place] [address] [phone]              Discharge Instructions    Amb Referral to Cardiac Rehabilitation   Complete by: As directed    Wants referral to [facility] CRP 2   Diagnosis: Coronary Stents   After initial evaluation and assessments completed: Virtual Based Care may be provided alone or in conjunction with Phase 2 Cardiac Rehab based on patient barriers.: Yes   Call MD for:  redness, tenderness, or signs of infection (pain, swelling, redness, odor or green/yellow discharge around incision site)   Complete by: As directed    Diet - low sodium heart healthy   Complete by: As directed    Diet Carb Modified   Complete by: As directed    Increase activity slowly   Complete by: As directed       Discharge Medications   Allergies as of 02/25/2020      Reactions   Azithromycin    Blisters in mouth    Lodine [etodolac] Anaphylaxis      Medication List    STOP taking these medications   chlorthalidone 25 MG tablet Commonly known as: HYGROTON   Potassium Chloride ER 20 MEQ Tbcr     TAKE these medications   acetaminophen 325 MG tablet Commonly known as: TYLENOL Take 2 tablets (650 mg total) by mouth every 4 (four) hours as needed for headache or mild pain.   albuterol 108 (90 Base) MCG/ACT inhaler Commonly known as: VENTOLIN HFA Inhale into the lungs  every 6 (six) hours as needed for wheezing or shortness of breath.   albuterol (2.5 MG/3ML) 0.083% nebulizer solution Commonly known as: PROVENTIL Take 2.5  mg by nebulization every 6 (six) hours as needed for wheezing or shortness of breath.   allopurinol 300 MG tablet Commonly known as: ZYLOPRIM Take 300 mg by mouth daily.   aspirin EC 81 MG tablet Take 1 tablet (81 mg total) by mouth daily. Swallow whole.   buPROPion 150 MG 24 hr tablet Commonly known as: WELLBUTRIN XL Take 150 mg by mouth every morning.   carvedilol 12.5 MG tablet Commonly known as: COREG Take 1 tablet (12.5 mg total) by mouth 2 (two) times daily. What changed:   medication strength  how much to take   clopidogrel 75 MG tablet Commonly known as: PLAVIX Take 1 tablet (75 mg total) by mouth daily.   cyclobenzaprine 10 MG tablet Commonly known as: FLEXERIL Take 0.5 tablets (5 mg total) by mouth 2 (two) times daily as needed for muscle spasms. What changed:   how much to take  when to take this   fluticasone 50 MCG/ACT nasal spray Commonly known as: FLONASE Place 1 spray into both nostrils daily.   furosemide 20 MG tablet Commonly known as: LASIX Take 1 tablet (20 mg total) by mouth daily as needed for fluid or edema (weight gain of 3 lbs or more in a day or 5 lbs in a week). What changed:   when to take this  reasons to take this   gabapentin 300 MG capsule Commonly known as: NEURONTIN Take 300 mg by mouth 3 (three) times daily.   HYDROcodone-acetaminophen 5-325 MG tablet Commonly known as: NORCO/VICODIN Take 1 tablet by mouth every 6 (six) hours as needed for moderate pain.   insulin glargine 100 UNIT/ML Solostar Pen Commonly known as: LANTUS Inject 20 Units into the skin daily. What changed: when to take this   isosorbide mononitrate 30 MG 24 hr tablet Commonly known as: IMDUR Take 1 tablet (30 mg total) by mouth daily.   levETIRAcetam 500 MG tablet Commonly known as:  KEPPRA Take 1 tablet (500 mg total) by mouth 2 (two) times daily.   losartan 25 MG tablet Commonly known as: COZAAR Take 1/2 tablet ( 12.5 mg ) daily What changed:   how much to take  how to take this  when to take this  additional instructions   mometasone-formoterol 200-5 MCG/ACT Aero Commonly known as: DULERA Inhale 2 puffs into the lungs 2 (two) times daily.   montelukast 10 MG tablet Commonly known as: SINGULAIR Take 10 mg by mouth at bedtime.   nitroGLYCERIN 0.2 mg/hr patch Commonly known as: NITRODUR - Dosed in mg/24 hr Place 0.2 mg onto the skin daily as needed (shoulder pains).   nitroGLYCERIN 0.4 MG SL tablet Commonly known as: NITROSTAT Place 1 tablet (0.4 mg total) under the tongue every 5 (five) minutes as needed for chest pain.   nortriptyline 25 MG capsule Commonly known as: PAMELOR Take 25 mg by mouth at bedtime.   pantoprazole 40 MG tablet Commonly known as: PROTONIX Take 40 mg by mouth daily.   PenTips 32G X 4 MM Misc Generic drug: Insulin Pen Needle See admin instructions. with insulin   rosuvastatin 20 MG tablet Commonly known as: CRESTOR Take 1 tablet (20 mg total) by mouth daily. Start taking on: February 26, 2020   triamcinolone 0.1 % Commonly known as: KENALOG Apply 1 application topically 2 (two) times daily as needed (irritation).   zolpidem 10 MG tablet Commonly known as: AMBIEN Take 10 mg by mouth at bedtime.          Outstanding  Labs/Studies   None  Duration of Discharge Encounter   Greater than 30 minutes including physician time.  Signed, Theodore Demark, PA-C 02/25/2020, 8:56 AM   Cardiology Attending  Patient seen and examined. HR and bp are better after uptitration of coreg. He appears to be stable for DC home. I have reviewed the activity restriction with the patient. He will be discharged home with followup as above.   Sharlot Gowda Danielly Ackerley,MD

## 2020-02-25 NOTE — Discharge Instructions (Signed)

## 2020-02-26 LAB — GLUCOSE, CAPILLARY: Glucose-Capillary: 157 mg/dL — ABNORMAL HIGH (ref 70–99)

## 2020-03-04 ENCOUNTER — Telehealth (HOSPITAL_COMMUNITY): Payer: Self-pay

## 2020-03-04 NOTE — Telephone Encounter (Signed)
Cardiac rehab referral for Ph.II faxed to [place].  

## 2020-03-07 ENCOUNTER — Ambulatory Visit: Payer: Medicaid Other | Admitting: Neurology

## 2020-03-24 NOTE — Progress Notes (Signed)
Cardiology Office Note:   Date:  03/25/2020  NAME:  Micheal Bell    MRN: [id number] DOB:  05/27/1970   PCP:  [name], MD  Cardiologist:  [name], MD   Referring MD: [name], MD   Chief Complaint  Patient presents with  . Coronary Artery Disease   History of Present Illness:   [name] is a 50 y.o. male with a hx of ischemic CM (30-35%), 3v CAD s/p PCI, DM, HLD, HTN, DVT who presents for follow-up. Sent for LHC after abnormal stress test and found to have 3vCAD. Turned down for CABG and underwent PCI to LAD/LCX. He reports he is doing well since leaving the hospital.  No bleeding at the right radial cath site.  He is not driving.  He reports he still gets short of breath but this is improving.  Symptoms of shortness of breath occur with exertion.  Not all the time.  He is still using a walker.  He also reports intermittent episodes of chest pain.  They are occurring to 3 times per day.  Can occur at rest or with exertion.  Symptoms resolve quickly with nitroglycerin.  Blood pressure 130/72.  Heart rate 98.  EKG with no acute ischemic changes.  We discussed that medical management is best here.  Likely can increase his beta-blocker.  Also needs heart failure medication optimization.  Coreg dose can be increased.  I recommended Entresto and Jardiance and Aldactone.  No bleeding reported with aspirin and Plavix.  Still not that active but working on this.  He also uses smokeless tobacco.  We discussed cessation of this.  He is also work on his diabetes.  We discussed Jardiance.  He is okay to start this.  He is taking Lasix 20 mg daily as needed.  Overall volume status appears acceptable.  Cath site clean.  Further titration of heart failure medications and optimization.  Most recent LDL cholesterol also not at goal.  Needs his Crestor increased.  Problem List 1. CAD -abnormal stress/cardiomyopathy 01/10/2020 -3v CAD 02/12/2020: 90% PDA, 90% LCX; 95% LAD -CABG deferred  due to deconditioning -2v PCI -> LAD/LCX 02/20/2020 2. CHF -EF 30-35% in setting of sepsis  -EF improved to 55% at discharge (stress related) -insetting of MSSA sepsis/foot abscess/covid 19 PNA/cerebritis  3. DM -A1c 11.3 4. HTN 5. CVA -prior L frontal lobe 6. HLD -T chol 168, HDL 43, LDL 99, TG 130 7. DVT -R popliteal vein eliquis started 11/11/2019 -completed 3 months therapy  8. Smokeless tobacco -Dip user for 45 years   Past Medical History: Past Medical History:  Diagnosis Date  . Arthritis   . Asthma   . Coronary artery disease   . Diabetes mellitus without complication (HCC)   . DVT (deep venous thrombosis) (HCC)   . Fibromyalgia   . Gout   . Hypertension   . IBS (irritable bowel syndrome)   . Vein disorder     Past Surgical History: Past Surgical History:  Procedure Laterality Date  . CORONARY ATHERECTOMY N/A 02/20/2020   Procedure: CORONARY ATHERECTOMY;  Surgeon: [name], [name], MD;  Location: [facility] INVASIVE CV LAB;  Service: Cardiovascular;  Laterality: N/A;  CSI LAD  . CORONARY STENT INTERVENTION N/A 02/20/2020   Procedure: CORONARY STENT INTERVENTION;  Surgeon: [name], [name], MD;  Location: [facility] INVASIVE CV LAB;  Service: Cardiovascular;  Laterality: N/A;  LAD CFX  . FOOT SURGERY    . INTRAVASCULAR ULTRASOUND/IVUS N/A 02/20/2020  Procedure: Intravascular Ultrasound/IVUS;  Surgeon: [name], [name], MD;  Location: [facility] INVASIVE CV LAB;  Service: Cardiovascular;  Laterality: N/A;  . LEFT HEART CATH AND CORONARY ANGIOGRAPHY N/A 02/12/2020   Procedure: LEFT HEART CATH AND CORONARY ANGIOGRAPHY;  Surgeon: [name], MD;  Location: [facility] INVASIVE CV LAB;  Service: Cardiovascular;  Laterality: N/A;  . TEE WITHOUT CARDIOVERSION N/A 10/24/2019   Procedure: TRANSESOPHAGEAL ECHOCARDIOGRAM (TEE);  Surgeon: [name], MD;  Location: [facility] ENDOSCOPY;  Service: Cardiovascular;  Laterality: N/A;    Current Medications: Current Meds  Medication Sig  . acetaminophen  (TYLENOL) 325 MG tablet Take 2 tablets (650 mg total) by mouth every 4 (four) hours as needed for headache or mild pain.  Marland Kitchen albuterol (PROVENTIL HFA;VENTOLIN HFA) 108 (90 Base) MCG/ACT inhaler Inhale into the lungs every 6 (six) hours as needed for wheezing or shortness of breath.  Marland Kitchen albuterol (PROVENTIL) (2.5 MG/3ML) 0.083% nebulizer solution Take 2.5 mg by nebulization every 6 (six) hours as needed for wheezing or shortness of breath.  . allopurinol (ZYLOPRIM) 300 MG tablet Take 300 mg by mouth daily.  Marland Kitchen buPROPion (WELLBUTRIN XL) 150 MG 24 hr tablet Take 150 mg by mouth every morning.  . cyclobenzaprine (FLEXERIL) 10 MG tablet Take 0.5 tablets (5 mg total) by mouth 2 (two) times daily as needed for muscle spasms. (Patient taking differently: Take 10 mg by mouth 3 (three) times daily.)  . empagliflozin (JARDIANCE) 10 MG TABS tablet Take 1 tablet (10 mg total) by mouth daily before breakfast.  . fluticasone (FLONASE) 50 MCG/ACT nasal spray Place 1 spray into both nostrils daily.  . furosemide (LASIX) 20 MG tablet Take 1 tablet (20 mg total) by mouth daily as needed for fluid or edema (weight gain of 3 lbs or more in a day or 5 lbs in a week).  . gabapentin (NEURONTIN) 300 MG capsule Take 300 mg by mouth 3 (three) times daily.  Marland Kitchen HYDROcodone-acetaminophen (NORCO/VICODIN) 5-325 MG tablet Take 1 tablet by mouth every 6 (six) hours as needed for moderate pain.  Marland Kitchen insulin glargine (LANTUS) 100 UNIT/ML Solostar Pen Inject 20 Units into the skin daily. (Patient taking differently: Inject 20 Units into the skin every evening.)  . isosorbide mononitrate (IMDUR) 30 MG 24 hr tablet Take 1 tablet (30 mg total) by mouth daily.  Marland Kitchen levETIRAcetam (KEPPRA) 500 MG tablet Take 1 tablet (500 mg total) by mouth 2 (two) times daily.  Marland Kitchen losartan (COZAAR) 25 MG tablet Take 1/2 tablet ( 12.5 mg ) daily (Patient taking differently: Take 12.5 mg by mouth in the morning and at bedtime.)  . mometasone-formoterol (DULERA) 200-5  MCG/ACT AERO Inhale 2 puffs into the lungs 2 (two) times daily.  . montelukast (SINGULAIR) 10 MG tablet Take 10 mg by mouth at bedtime.  . nitroGLYCERIN (NITRODUR - DOSED IN MG/24 HR) 0.2 mg/hr patch Place 0.2 mg onto the skin daily as needed (shoulder pains).  . nitroGLYCERIN (NITROSTAT) 0.4 MG SL tablet Place 1 tablet (0.4 mg total) under the tongue every 5 (five) minutes as needed for chest pain.  . nortriptyline (PAMELOR) 25 MG capsule Take 25 mg by mouth at bedtime.  . pantoprazole (PROTONIX) 40 MG tablet Take 40 mg by mouth daily.  Marland Kitchen PENTIPS 32G X 4 MM MISC See admin instructions. with insulin  . sacubitril-valsartan (ENTRESTO) 24-26 MG Take 1 tablet by mouth 2 (two) times daily.  Marland Kitchen spironolactone (ALDACTONE) 25 MG tablet Take 0.5 tablets (12.5 mg total) by mouth daily.  Marland Kitchen triamcinolone (  KENALOG) 0.1 % Apply 1 application topically 2 (two) times daily as needed (irritation).  Marland Kitchen zolpidem (AMBIEN) 10 MG tablet Take 10 mg by mouth at bedtime.  . [DISCONTINUED] aspirin EC 81 MG tablet Take 1 tablet (81 mg total) by mouth daily. Swallow whole.  . [DISCONTINUED] carvedilol (COREG) 12.5 MG tablet Take 1 tablet (12.5 mg total) by mouth 2 (two) times daily.  . [DISCONTINUED] clopidogrel (PLAVIX) 75 MG tablet Take 1 tablet (75 mg total) by mouth daily.  . [DISCONTINUED] rosuvastatin (CRESTOR) 20 MG tablet Take 1 tablet (20 mg total) by mouth daily.     Allergies:    Azithromycin and Lodine [etodolac]   Social History: Social History   Socioeconomic History  . Marital status: Single    Spouse name: Not on file  . Number of children: Not on file  . Years of education: Not on file  . Highest education level: Not on file  Occupational History  . Occupation: disabled  Tobacco Use  . Smoking status: Never Smoker  . Smokeless tobacco: Never Used  Vaping Use  . Vaping Use: Never used  Substance and Sexual Activity  . Alcohol use: Never  . Drug use: Never  . Sexual activity: Not on file   Other Topics Concern  . Not on file  Social History Narrative  . Not on file   Social Determinants of Health   Financial Resource Strain: Not on file  Food Insecurity: Not on file  Transportation Needs: Not on file  Physical Activity: Not on file  Stress: Not on file  Social Connections: Not on file    Family History: The patient's family history includes Diabetes in his mother; Heart disease in his father and mother.  ROS:   All other ROS reviewed and negative. Pertinent positives noted in the HPI.     EKGs/Labs/Other Studies Reviewed:   The following studies were personally reviewed by me today:  EKG:  EKG is ordered today.  The ekg ordered today demonstrates normal sinus rhythm heart rate 98, no acute ischemic changes no evidence of infarction, and was personally reviewed by me.   LHC 02/20/2020  Dist LAD lesion is 70% stenosed.  A drug-eluting stent was successfully placed using a STENT RESOLUTE ONYX 2.5X38.  Post intervention, there is a 0% residual stenosis.  Prox LAD to Mid LAD lesion is 95% stenosed.  A drug-eluting stent was successfully placed using a STENT RESOLUTE RTMY1.1Z73.  A drug-eluting stent was successfully placed using a STENT RESOLUTE ONYX 3.0X15.  Post intervention, there is a 0% residual stenosis.  Prox Cx to Mid Cx lesion is 90% stenosed.  A drug-eluting stent was successfully placed using a SYNERGY XD 2.50X48.  Post intervention, there is a 0% residual stenosis.   1. Successful complex PCI of the proximal to distal LAD with orbital atherectomy and DES x 3 with IVUS guidance. 2. Successful PCI of the LCX/OM1 with long DES x 1.  Plan: DAPT indefinitely due to extensive nature of stents. Will monitor overnight and anticipate DC in am. We did not treat the PDA lesion as this vessel is small in caliber and diffusely diseased. I would manage this medically.    Recent Labs: 10/26/2019: TSH 4.137 11/08/2019: ALT 18 11/09/2019: B Natriuretic  Peptide 28.8 02/25/2020: BUN 9; Creatinine, Ser 0.78; Hemoglobin 11.3; Magnesium 1.5; Platelets 292; Potassium 3.9; Sodium 135   Recent Lipid Panel    Component Value Date/Time   CHOL 168 10/26/2019 0402   TRIG 130 10/26/2019 0402  HDL 43 10/26/2019 0402   CHOLHDL 3.9 10/26/2019 0402   VLDL 26 10/26/2019 0402   LDLCALC 99 10/26/2019 0402    Physical Exam:   VS:  BP 130/72   Pulse 98   Ht 6\' 2"  (1.88 m)   Wt 245 lb 12.8 oz (111.5 kg)   SpO2 94%   BMI 31.56 kg/m    Wt Readings from Last 3 Encounters:  03/25/20 245 lb 12.8 oz (111.5 kg)  02/24/20 233 lb 8 oz (105.9 kg)  02/14/20 230 lb (104.3 kg)    General: Well nourished, well developed, in no acute distress Head: Atraumatic, normal size  Eyes: PEERLA, EOMI  Neck: Supple, no JVD Endocrine: No thryomegaly Cardiac: Normal S1, S2; RRR; no murmurs, rubs, or gallops Lungs: Clear to auscultation bilaterally, no wheezing, rhonchi or rales  Abd: Soft, nontender, no hepatomegaly  Ext: No edema, pulses 2+ Musculoskeletal: No deformities, BUE and BLE strength normal and equal Skin: Warm and dry, no rashes   Neuro: Alert and oriented to person, place, time, and situation, CNII-XII grossly intact, no focal deficits  Psych: Normal mood and affect   ASSESSMENT:   Micheal Bell is a 50 y.o. male who presents for the following: 1. Coronary artery disease involving native coronary artery of native heart without angina pectoris   2. Mixed hyperlipidemia   3. Chronic systolic heart failure (HCC)   4. Primary hypertension   5. Acute deep vein thrombosis (DVT) of popliteal vein of right lower extremity (HCC)   6. Tobacco abuse     PLAN:   1. Coronary artery disease involving native coronary artery of native heart without angina pectoris 2. Mixed hyperlipidemia -abnormal stress/cardiomyopathy 01/10/2020 -3v CAD 02/12/2020: 90% PDA, 90% LCX; 95% LAD -CABG deferred due to deconditioning -2v PCI -> LAD/LCX 02/20/2020 -Doing well.   Still having chest pain episodes.  We will continue with titration of his beta-blocker.  Increase Coreg to 25 mg twice daily. -He will continue DAPT for now.  Interventional cardiology recommended indefinite DAPT.  We will see how he does. -He still has a 90% PDA lesion that is small.  I suspect this is the source of his pain.  Hopefully with titration of blood pressure and better control of heart rate symptoms will improve. -He will continue Imdur 30 mg daily as well. -Increase Crestor to 40 mg daily.  Goal LDL less than 70 and goal triglycerides less than 150.  3. Chronic systolic heart failure (HCC) -Ischemic cardiomyopathy, EF 30-35%.  I did review the TEE and I do not think his EF has recovered. -Stress test with low EF as well. -Euvolemic on exam. -Increase Coreg to 25 mg twice a day.  Stop losartan start Entresto 24-26 mg twice daily.  BMP today.  Start Aldactone 12.5 mg daily.  Start Jardiance 10 mg daily. -He will come back for titration of Entresto in 2 weeks with pharmacy staff.  He will see me in 2 months. -He is on Lasix 20 mg daily as needed.  Weights are stable. -We will plan to repeat an echocardiogram in 3 to 6 months.  Hopefully his EF will recover.  If not we will consider him for ICD.  4. Primary hypertension -Coreg, Entresto, Aldactone.  5. Acute deep vein thrombosis (DVT) of popliteal vein of right lower extremity (HCC) -R popliteal vein eliquis started 11/11/2019 -completed 3 months therapy   6. Tobacco abuse -Smokeless tobacco user for 45 years.  Counseled on quitting today.   Disposition: Return  in about 2 months (around 05/23/2020).  Medication Adjustments/Labs and Tests Ordered: Current medicines are reviewed at length with the patient today.  Concerns regarding medicines are outlined above.  Orders Placed This Encounter  Procedures  . Basic metabolic panel  . EKG 12-Lead   Meds ordered this encounter  Medications  . rosuvastatin (CRESTOR) 40 MG tablet     Sig: Take 1 tablet (40 mg total) by mouth daily.    Dispense:  90 tablet    Refill:  1  . aspirin EC 81 MG tablet    Sig: Take 1 tablet (81 mg total) by mouth daily. Swallow whole.    Dispense:  30 tablet    Refill:  11  . clopidogrel (PLAVIX) 75 MG tablet    Sig: Take 1 tablet (75 mg total) by mouth daily.    Dispense:  90 tablet    Refill:  3  . carvedilol (COREG) 25 MG tablet    Sig: Take 1 tablet (25 mg total) by mouth 2 (two) times daily.    Dispense:  180 tablet    Refill:  3  . spironolactone (ALDACTONE) 25 MG tablet    Sig: Take 0.5 tablets (12.5 mg total) by mouth daily.    Dispense:  60 tablet    Refill:  1  . empagliflozin (JARDIANCE) 10 MG TABS tablet    Sig: Take 1 tablet (10 mg total) by mouth daily before breakfast.    Dispense:  90 tablet    Refill:  1    Patient Instructions  Medication Instructions:  Increase Coreg 25 mg twice daily  Increase Crestor 40 mg daily  Stop Losartan today Start Entresto 24/26 mg twice daily start tomorrow (use samples) Start Aldactone 12.5 mg daily  Start Jardiance 10 mg daily   *If you need a refill on your cardiac medications before your next appointment, please call your pharmacy*   Lab Work: BMET today  If you have labs (blood work) drawn today and your tests are completely normal, you will receive your results only by: Marland Kitchen. MyChart Message (if you have MyChart) OR . A paper copy in the mail If you have any lab test that is abnormal or we need to change your treatment, we will call you to review the results.   Follow-Up: At [facility], you and your health needs are our priority.  As part of our continuing mission to provide you with exceptional heart care, we have created designated Provider Care Teams.  These Care Teams include your primary Cardiologist (physician) and Advanced Practice Providers (APPs -  Physician Assistants and Nurse Practitioners) who all work together to provide you with the care you need, when  you need it.  We recommend signing up for the patient portal called "MyChart".  Sign up information is provided on this After Visit Summary.  MyChart is used to connect with patients for Virtual Visits (Telemedicine).  Patients are able to view lab/test results, encounter notes, upcoming appointments, etc.  Non-urgent messages can be sent to your provider as well.   To learn more about what you can do with MyChart, go to [email or web address].    Your next appointment:   2 month(s)  The format for your next appointment:   In Person  Provider:   Lennie OdorWesley O'Neal, MD   Other Instructions See PHARMD in 2 weeks for Entresto Titration       Time Spent with Patient: I have spent a total of 35 minutes with patient reviewing hospital  notes, telemetry, EKGs, labs and examining the patient as well as establishing an assessment and plan that was discussed with the patient.  > 50% of time was spent in direct patient care.  Signed, Lenna Gilford. Flora Lipps, MD [facility]  [address] [place], [place] [address] [phone]  03/25/2020 11:39 AM

## 2020-03-25 ENCOUNTER — Other Ambulatory Visit: Payer: Self-pay

## 2020-03-25 ENCOUNTER — Encounter: Payer: Self-pay | Admitting: Cardiovascular Disease

## 2020-03-25 ENCOUNTER — Ambulatory Visit (INDEPENDENT_AMBULATORY_CARE_PROVIDER_SITE_OTHER): Payer: Medicaid Other | Admitting: Cardiovascular Disease

## 2020-03-25 VITALS — BP 130/72 | HR 98 | Ht 74.0 in | Wt 245.8 lb

## 2020-03-25 DIAGNOSIS — I5022 Chronic systolic (congestive) heart failure: Secondary | ICD-10-CM

## 2020-03-25 DIAGNOSIS — E782 Mixed hyperlipidemia: Secondary | ICD-10-CM | POA: Diagnosis not present

## 2020-03-25 DIAGNOSIS — I2510 Atherosclerotic heart disease of native coronary artery without angina pectoris: Secondary | ICD-10-CM | POA: Diagnosis not present

## 2020-03-25 DIAGNOSIS — I82431 Acute embolism and thrombosis of right popliteal vein: Secondary | ICD-10-CM

## 2020-03-25 DIAGNOSIS — I10 Essential (primary) hypertension: Secondary | ICD-10-CM | POA: Diagnosis not present

## 2020-03-25 DIAGNOSIS — Z720 Tobacco use: Secondary | ICD-10-CM

## 2020-03-25 MED ORDER — SPIRONOLACTONE 25 MG PO TABS: 12.5000 mg | ORAL_TABLET | Freq: Every day | ORAL | 1 refills | Status: DC

## 2020-03-25 MED ORDER — ASPIRIN EC 81 MG PO TBEC: 81.0000 mg | DELAYED_RELEASE_TABLET | Freq: Every day | ORAL | 11 refills | Status: DC

## 2020-03-25 MED ORDER — EMPAGLIFLOZIN 10 MG PO TABS: 10.0000 mg | ORAL_TABLET | Freq: Every day | ORAL | 1 refills | Status: DC

## 2020-03-25 MED ORDER — CLOPIDOGREL BISULFATE 75 MG PO TABS: 75.0000 mg | ORAL_TABLET | Freq: Every day | ORAL | 3 refills | Status: DC

## 2020-03-25 MED ORDER — ROSUVASTATIN CALCIUM 40 MG PO TABS: 40.0000 mg | ORAL_TABLET | Freq: Every day | ORAL | 1 refills | Status: DC

## 2020-03-25 MED ORDER — CARVEDILOL 25 MG PO TABS: 25.0000 mg | ORAL_TABLET | Freq: Two times a day (BID) | ORAL | 3 refills | Status: DC

## 2020-03-25 NOTE — Patient Instructions (Addendum)
Medication Instructions:  Increase Coreg 25 mg twice daily  Increase Crestor 40 mg daily  Stop Losartan today Start Entresto 24/26 mg twice daily start tomorrow (use samples) Start Aldactone 12.5 mg daily  Start Jardiance 10 mg daily   *If you need a refill on your cardiac medications before your next appointment, please call your pharmacy*   Lab Work: BMET today  If you have labs (blood work) drawn today and your tests are completely normal, you will receive your results only by: Marland Kitchen MyChart Message (if you have MyChart) OR . A paper copy in the mail If you have any lab test that is abnormal or we need to change your treatment, we will call you to review the results.   Follow-Up: At [facility], you and your health needs are our priority.  As part of our continuing mission to provide you with exceptional heart care, we have created designated Provider Care Teams.  These Care Teams include your primary Cardiologist (physician) and Advanced Practice Providers (APPs -  Physician Assistants and Nurse Practitioners) who all work together to provide you with the care you need, when you need it.  We recommend signing up for the patient portal called "MyChart".  Sign up information is provided on this After Visit Summary.  MyChart is used to connect with patients for Virtual Visits (Telemedicine).  Patients are able to view lab/test results, encounter notes, upcoming appointments, etc.  Non-urgent messages can be sent to your provider as well.   To learn more about what you can do with MyChart, go to [email or web address].    Your next appointment:   2 month(s)  The format for your next appointment:   In Person  Provider:   Lennie Odor, MD   Other Instructions See PHARMD in 2 weeks for Entresto Titration

## 2020-03-26 ENCOUNTER — Ambulatory Visit: Payer: Medicaid Other | Admitting: Cardiovascular Disease

## 2020-03-26 DIAGNOSIS — Z419 Encounter for procedure for purposes other than remedying health state, unspecified: Secondary | ICD-10-CM | POA: Diagnosis not present

## 2020-03-26 LAB — BASIC METABOLIC PANEL
BUN/Creatinine Ratio: 11 (ref 9–20)
BUN: 9 mg/dL (ref 6–24)
CO2: 26 mmol/L (ref 20–29)
Calcium: 10 mg/dL (ref 8.7–10.2)
Chloride: 100 mmol/L (ref 96–106)
Creatinine, Ser: 0.84 mg/dL (ref 0.76–1.27)
GFR calc Af Amer: 117 mL/min/{1.73_m2} (ref >59)
GFR calc non Af Amer: 101 mL/min/{1.73_m2} (ref >59)
Glucose: 159 mg/dL — ABNORMAL HIGH (ref 65–99)
Potassium: 4.4 mmol/L (ref 3.5–5.2)
Sodium: 139 mmol/L (ref 134–144)

## 2020-03-27 ENCOUNTER — Telehealth: Payer: Self-pay | Admitting: Cardiovascular Disease

## 2020-03-27 NOTE — Telephone Encounter (Signed)
Patient calling the office for samples of medication:   1.  What medication and dosage are you requesting samples for? empagliflozin (JARDIANCE) 10 MG TABS tablet  2.  Are you currently out of this medication? yes

## 2020-03-27 NOTE — Telephone Encounter (Signed)
Spoke with patient, advised patient that samples of Jardiance 10mg  will be left at front desk for patient to pick up. Patient verbalized understanding.   Medication Samples have been provided to the patient.  Drug name: Jardiance       Strength: 10mg         Qty: 3 Boxes LOT:  Exp.Date: 8/23   [id number], RN  1:01 PM 03/27/2020

## 2020-04-01 ENCOUNTER — Other Ambulatory Visit: Payer: Self-pay

## 2020-04-01 MED ORDER — EMPAGLIFLOZIN 10 MG PO TABS: 10.0000 mg | ORAL_TABLET | Freq: Every day | ORAL | 3 refills | Status: DC

## 2020-04-08 ENCOUNTER — Ambulatory Visit (INDEPENDENT_AMBULATORY_CARE_PROVIDER_SITE_OTHER): Payer: Medicaid Other | Admitting: Pharmacist

## 2020-04-08 ENCOUNTER — Other Ambulatory Visit: Payer: Self-pay

## 2020-04-08 VITALS — BP 98/56 | HR 94 | Wt 243.2 lb

## 2020-04-08 DIAGNOSIS — I5022 Chronic systolic (congestive) heart failure: Secondary | ICD-10-CM | POA: Diagnosis not present

## 2020-04-08 DIAGNOSIS — E785 Hyperlipidemia, unspecified: Secondary | ICD-10-CM

## 2020-04-08 MED ORDER — ROSUVASTATIN CALCIUM 20 MG PO TABS: 20.0000 mg | ORAL_TABLET | Freq: Every day | ORAL | 0 refills | Status: DC

## 2020-04-08 MED ORDER — ENTRESTO 24-26 MG PO TABS: 1.0000 | ORAL_TABLET | Freq: Two times a day (BID) | ORAL | 0 refills | Status: DC

## 2020-04-08 MED ORDER — SPIRONOLACTONE 25 MG PO TABS: 12.5000 mg | ORAL_TABLET | ORAL | 1 refills | Status: DC

## 2020-04-08 NOTE — Progress Notes (Signed)
Patient ID: [name]                 DOB: August 01, 1970                      MRN: [id number]      HPI:  Micheal Bell is a 50 y.o. male referred by Dr. Flora Bell to pharmacist clinic for HF medication titration. 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 Patient reports stable weigh and increased fatigue. Otherwise, he is taking all medication as prescribed. Denies symptoms of UTI/fungal infection. Only complian is increased muscle pain after increasing rosuvastatin dose.  Current HTN meds:  Carvedilol 25mg  twice daily Jardiance 10mg  daily Furosemide 20mg  daily as needed (every other day) Entresto 24-26mg  twice daily Spironolactone 12.5mg  daily  BP goal: <130/80  Family History: mother , grandmother, father - diabetes, Heart issues, and stroke  Social History: smokeless tobacco, denies smoking or alcohol intake  Diet: 2 bottles of watter (16 onz), bette appetite  Exercise: walker and chronic pain, bed excercises  Home BP readings: 114/45 per memory (no records available)  Wt Readings from Last 3 Encounters:  04/08/20 243 lb 3.2 oz (110.3 kg)  03/25/20 245 lb 12.8 oz (111.5 kg)  02/24/20 233 lb 8 oz (105.9 kg)   BP Readings from Last 3 Encounters:  04/08/20 (!) 98/56  03/25/20 130/72  02/25/20 113/68   Pulse Readings from Last 3 Encounters:  04/08/20 94  03/25/20 98  02/25/20 93    Renal function: Estimated Creatinine Clearance: 107.9 mL/min (by C-G formula based on SCr of 1.07 mg/dL).  Past Medical History:  Diagnosis Date   Arthritis    Asthma    Coronary artery disease    Diabetes mellitus without complication (HCC)    DVT (deep venous thrombosis) (HCC)    Fibromyalgia    Gout    Hypertension    IBS (irritable bowel syndrome)    Vein disorder     Current Outpatient Medications on File Prior to Visit  Medication Sig Dispense Refill   albuterol (PROVENTIL HFA;VENTOLIN  HFA) 108 (90 Base) MCG/ACT inhaler Inhale into the lungs every 6 (six) hours as needed for wheezing or shortness of breath.     allopurinol (ZYLOPRIM) 300 MG tablet Take 300 mg by mouth daily.     aspirin EC 81 MG tablet Take 1 tablet (81 mg total) by mouth daily. Swallow whole. 30 tablet 11   buPROPion (WELLBUTRIN XL) 150 MG 24 hr tablet Take 150 mg by mouth every morning.     carvedilol (COREG) 25 MG tablet Take 1 tablet (25 mg total) by mouth 2 (two) times daily. 180 tablet 3   clopidogrel (PLAVIX) 75 MG tablet Take 1 tablet (75 mg total) by mouth daily. 90 tablet 3   cyclobenzaprine (FLEXERIL) 10 MG tablet Take 0.5 tablets (5 mg total) by mouth 2 (two) times daily as needed for muscle spasms. (Patient taking differently: Take 10 mg by mouth 3 (three) times daily.) 15 tablet 0   empagliflozin (JARDIANCE) 10 MG TABS tablet Take 1 tablet (10 mg total) by mouth daily before breakfast. 90 tablet 3   fluticasone (FLONASE) 50 MCG/ACT nasal spray Place 1 spray into both nostrils daily.     furosemide (LASIX) 20 MG tablet Take 1 tablet (20 mg total) by mouth daily as needed for fluid or edema (weight gain of 3 lbs or more in a day or  5 lbs in a week). 30 tablet 3   gabapentin (NEURONTIN) 300 MG capsule Take 300 mg by mouth 3 (three) times daily.     HYDROmorphone (DILAUDID) 2 MG tablet Take 2 mg by mouth 3 (three) times daily.     insulin glargine (LANTUS) 100 UNIT/ML Solostar Pen Inject 20 Units into the skin daily. (Patient taking differently: Inject 20 Units into the skin every evening.) 15 mL 0   isosorbide mononitrate (IMDUR) 30 MG 24 hr tablet Take 1 tablet (30 mg total) by mouth daily. 30 tablet 11   levETIRAcetam (KEPPRA) 500 MG tablet Take 1 tablet (500 mg total) by mouth 2 (two) times daily. 60 tablet 0   mometasone-formoterol (DULERA) 200-5 MCG/ACT AERO Inhale 2 puffs into the lungs 2 (two) times daily.     montelukast (SINGULAIR) 10 MG tablet Take 10 mg by mouth at bedtime.      nortriptyline (PAMELOR) 25 MG capsule Take 25 mg by mouth at bedtime.     pantoprazole (PROTONIX) 40 MG tablet Take 40 mg by mouth daily.     PENTIPS 32G X 4 MM MISC See admin instructions. with insulin     triamcinolone (KENALOG) 0.1 % Apply 1 application topically 2 (two) times daily as needed (irritation).     zolpidem (AMBIEN) 10 MG tablet Take 10 mg by mouth at bedtime.     acetaminophen (TYLENOL) 325 MG tablet Take 2 tablets (650 mg total) by mouth every 4 (four) hours as needed for headache or mild pain. (Patient not taking: Reported on 04/08/2020)     albuterol (PROVENTIL) (2.5 MG/3ML) 0.083% nebulizer solution Take 2.5 mg by nebulization every 6 (six) hours as needed for wheezing or shortness of breath. (Patient not taking: Reported on 04/08/2020)     nitroGLYCERIN (NITRODUR - DOSED IN MG/24 HR) 0.2 mg/hr patch Place 0.2 mg onto the skin daily as needed (shoulder pains). (Patient not taking: Reported on 04/08/2020)     nitroGLYCERIN (NITROSTAT) 0.4 MG SL tablet Place 1 tablet (0.4 mg total) under the tongue every 5 (five) minutes as needed for chest pain. (Patient not taking: Reported on 04/08/2020) 25 tablet 11   Current Facility-Administered Medications on File Prior to Visit  Medication Dose Route Frequency Provider Last Rate Last Admin   regadenoson (LEXISCAN) injection SOLN 0.4 mg  0.4 mg Intravenous Once O'Neal, Ronnald Ramp, MD        Allergies  Allergen Reactions   Azithromycin     Blisters in mouth    Lodine [Etodolac] Anaphylaxis    Blood pressure (!) 98/56, pulse 94, weight 243 lb 3.2 oz (110.3 kg), SpO2 96 %.  Chronic systolic heart failure (HCC) Patient reports increased fatigue and BP down to 98/56 during OV. Will decrease spironolactone dose to 12.5mg  every other day, repeat BMET today, and follow up in 2 weeks.   Hyperlipidemia with target LDL less than 70 Rosuvastatin dose was increased from 20mg  daily to 40mg  daily during last OV. Patient developed  muscle pain after increasing dose.  Will decrease rosuvastatin back to 20mg  and re-assess during f/u in 2 weeks.   Micheal Bell PharmD, BCPS, CPP [facility] [address] [place] [facility] 04/11/2020 4:02 PM

## 2020-04-08 NOTE — Patient Instructions (Addendum)
Return for a  follow up appointment in 2 weeks  Go to the lab in TODAY  Check your blood pressure at home daily (if able) and keep record of the readings.  Take your BP meds as follows: *DECREASE spironolactone 12.5mg  to every other day*  *DECREASE rosuvastatin dose to 20mg  daily*  Bring all of your meds, your BP cuff and your record of home blood pressures to your next appointment.  Exercise as you're able, try to walk approximately 30 minutes per day.  Keep salt intake to a minimum, especially watch canned and prepared boxed foods.  Eat more fresh fruits and vegetables and fewer canned items.  Avoid eating in fast food restaurants.    HOW TO TAKE YOUR BLOOD PRESSURE: . Rest 5 minutes before taking your blood pressure. .  Don't smoke or drink caffeinated beverages for at least 30 minutes before. . Take your blood pressure before (not after) you eat. . Sit comfortably with your back supported and both feet on the floor (don't cross your legs). . Elevate your arm to heart level on a table or a desk. . Use the proper sized cuff. It should fit smoothly and snugly around your bare upper arm. There should be enough room to slip a fingertip under the cuff. The bottom edge of the cuff should be 1 inch above the crease of the elbow. . Ideally, take 3 measurements at one sitting and record the average.

## 2020-04-09 LAB — BASIC METABOLIC PANEL
BUN/Creatinine Ratio: 13 (ref 9–20)
BUN: 14 mg/dL (ref 6–24)
CO2: 22 mmol/L (ref 20–29)
Calcium: 9.5 mg/dL (ref 8.7–10.2)
Chloride: 97 mmol/L (ref 96–106)
Creatinine, Ser: 1.07 mg/dL (ref 0.76–1.27)
GFR calc Af Amer: 92 mL/min/{1.73_m2} (ref >59)
GFR calc non Af Amer: 80 mL/min/{1.73_m2} (ref >59)
Glucose: 158 mg/dL — ABNORMAL HIGH (ref 65–99)
Potassium: 4.6 mmol/L (ref 3.5–5.2)
Sodium: 136 mmol/L (ref 134–144)

## 2020-04-11 ENCOUNTER — Encounter: Payer: Self-pay | Admitting: Pharmacist

## 2020-04-11 NOTE — Assessment & Plan Note (Addendum)
Patient reports increased fatigue and BP down to 98/56 during OV. Will decrease spironolactone dose to 12.5mg  every other day, repeat BMET today, and follow up in 2 weeks.

## 2020-04-11 NOTE — Assessment & Plan Note (Signed)
Rosuvastatin dose was increased from 20mg  daily to 40mg  daily during last OV. Patient developed muscle pain after increasing dose.  Will decrease rosuvastatin back to 20mg  and re-assess during f/u in 2 weeks.

## 2020-04-16 ENCOUNTER — Telehealth: Payer: Self-pay

## 2020-04-16 NOTE — Telephone Encounter (Signed)
Can you help me with PA?   Jardiance 10 mg tablets  covermymeds info:   Key: [id number] Last name: Bonnette DOB: [birth year]  Thank you!

## 2020-04-16 NOTE — Telephone Encounter (Signed)
**Note De-Identified Khyri Hinzman Obfuscation** I attempted to do this Jardiance PA through covermymeds but was unable to.  I called [employer] and attempted to do it over the phone with Alphia Kava but was advised that a Jardiance PA form will have to be completed and faxed ack to them. She states that she is faxing the form to me at [phone].

## 2020-04-16 NOTE — Telephone Encounter (Signed)
**Note De-Identified Micheal Bell Obfuscation** Jardiance PA started through covermymeds. Key: [id number]

## 2020-04-18 NOTE — Telephone Encounter (Signed)
**Note De-Identified Micheal Bell Obfuscation** Jardiance PA form not received from [facility] I went on their Web site and printed a form (unsure if it is the correct form but the only one I could find for a Medicaid claim). This was a very detailed form to complete and took some time.  I completed the form and attached Dr Richrd Sox office visit notes from 03/25/2020  with added past tried and failed medication information written on last page.  I have emailed all to Dr O'Neal's nurse so she can obtain his signature and to fax all to [facility] at fax number written on cover letter included.

## 2020-04-23 DIAGNOSIS — Z419 Encounter for procedure for purposes other than remedying health state, unspecified: Secondary | ICD-10-CM | POA: Diagnosis not present

## 2020-04-23 NOTE — Telephone Encounter (Signed)
Just wanted to keep you updated, I submitted that PA request in.   Thank you for doing that!

## 2020-04-25 ENCOUNTER — Ambulatory Visit (INDEPENDENT_AMBULATORY_CARE_PROVIDER_SITE_OTHER): Payer: Medicaid Other | Admitting: Podiatry

## 2020-04-25 ENCOUNTER — Ambulatory Visit (INDEPENDENT_AMBULATORY_CARE_PROVIDER_SITE_OTHER): Payer: Medicaid Other

## 2020-04-25 ENCOUNTER — Other Ambulatory Visit: Payer: Self-pay

## 2020-04-25 ENCOUNTER — Ambulatory Visit (INDEPENDENT_AMBULATORY_CARE_PROVIDER_SITE_OTHER): Payer: Medicaid Other | Admitting: Pharmacist Clinician (PhC)/ Clinical Pharmacy Specialist

## 2020-04-25 DIAGNOSIS — M79675 Pain in left toe(s): Secondary | ICD-10-CM

## 2020-04-25 DIAGNOSIS — M79674 Pain in right toe(s): Secondary | ICD-10-CM

## 2020-04-25 DIAGNOSIS — B351 Tinea unguium: Secondary | ICD-10-CM | POA: Diagnosis not present

## 2020-04-25 DIAGNOSIS — Q66229 Congenital metatarsus adductus, unspecified foot: Secondary | ICD-10-CM | POA: Diagnosis not present

## 2020-04-25 DIAGNOSIS — S92342A Displaced fracture of fourth metatarsal bone, left foot, initial encounter for closed fracture: Secondary | ICD-10-CM | POA: Diagnosis not present

## 2020-04-25 DIAGNOSIS — I5022 Chronic systolic (congestive) heart failure: Secondary | ICD-10-CM | POA: Diagnosis not present

## 2020-04-25 DIAGNOSIS — E1142 Type 2 diabetes mellitus with diabetic polyneuropathy: Secondary | ICD-10-CM | POA: Diagnosis not present

## 2020-04-25 DIAGNOSIS — E11610 Type 2 diabetes mellitus with diabetic neuropathic arthropathy: Secondary | ICD-10-CM

## 2020-04-25 MED ORDER — EMPAGLIFLOZIN 10 MG PO TABS: 10.0000 mg | ORAL_TABLET | Freq: Every day | ORAL | 3 refills | Status: DC

## 2020-04-25 NOTE — Telephone Encounter (Signed)
Just FYI- London Pepper was approved.  I will scan in paperwork to chart, and resend to pharmacy.   Thank you!~

## 2020-04-25 NOTE — Patient Instructions (Signed)
Return for a a follow up appointment with Dr. Flora Lipps on March 23  Your blood pressure today is 88/62  Check your blood pressure at home dail and keep record of the readings.  Take your BP meds as follows:  Cut carvedilol tablets in half and take just 1/2 tablet (12.5 mg) twice daily.  Continue with all other medications  Bring all of your meds, your BP cuff and your record of home blood pressures to your next appointment.  Exercise as you're able, try to walk approximately 30 minutes per day.  Keep salt intake to a minimum, especially watch canned and prepared boxed foods.  Eat more fresh fruits and vegetables and fewer canned items.  Avoid eating in fast food restaurants.    HOW TO TAKE YOUR BLOOD PRESSURE: . Rest 5 minutes before taking your blood pressure. .  Don't smoke or drink caffeinated beverages for at least 30 minutes before. . Take your blood pressure before (not after) you eat. . Sit comfortably with your back supported and both feet on the floor (don't cross your legs). . Elevate your arm to heart level on a table or a desk. . Use the proper sized cuff. It should fit smoothly and snugly around your bare upper arm. There should be enough room to slip a fingertip under the cuff. The bottom edge of the cuff should be 1 inch above the crease of the elbow. . Ideally, take 3 measurements at one sitting and record the average.

## 2020-04-25 NOTE — Progress Notes (Signed)
Patient ID: Micheal Bell                 DOB: [birth year]                      MRN: [id number]      HPI:  Micheal Bell is a 50 y.o. male referred by Dr. Flora Lipps to pharmacist clinic for HF medication titration. PMH includes ischemis cardiomyopathy with EF 30-35%, CAD s/p PCI, diabetes, hyperlipidemia, hypertension, and hx of DVT.  At his last visit with CVRR in February he was complaining of fatigue and noted to have a BP as low as 98/56.  At that time his spironolactone was decreased to 12.5 mg every other day.  His rosuvastatin was also decreased to 20 mg, as he did develop myalgias when dose was increased to 40 mg daily.    Today he returns for follow up.  His blood pressure has continued to be </= to 100 systolic, and while the highest he noted was 119, many are < 100.    Current HTN meds:  Carvedilol 25mg  twice daily Jardiance 10mg  daily Furosemide 20mg  daily as needed (every other day) Entresto 24-26mg  twice daily Spironolactone 12.5mg  daily  BP goal: <130/80  Family History: mother , grandmother, father - diabetes, Heart issues, and stroke  Social History: smokeless tobacco - states he is cutting back, trying to quit.  denies smoking or alcohol intake  Diet: 2 bottles of watter (16 oz) per day; diet is somewhat limited to what his elderly mother can cook; plenty of hot dogs and prepared type foods, not many fruits and vegetables, although does eat occasional apple  Exercise: walker and chronic pain, bed excercises  Home BP readings: 114/45 per memory (no records available)  Wt Readings from Last 3 Encounters:  04/25/20 240 lb (108.9 kg)  04/08/20 243 lb 3.2 oz (110.3 kg)  03/25/20 245 lb 12.8 oz (111.5 kg)   BP Readings from Last 3 Encounters:  04/25/20 (!) 88/62  04/08/20 (!) 98/56  03/25/20 130/72   Pulse Readings from Last 3 Encounters:  04/25/20 89  04/08/20 94  03/25/20 98    Renal function: Estimated Creatinine Clearance: 107.3 mL/min (by C-G formula based on  SCr of 1.07 mg/dL).  Past Medical History:  Diagnosis Date  . Arthritis   . Asthma   . Coronary artery disease   . Diabetes mellitus without complication (HCC)   . DVT (deep venous thrombosis) (HCC)   . Fibromyalgia   . Gout   . Hypertension   . IBS (irritable bowel syndrome)   . Vein disorder     Current Outpatient Medications on File Prior to Visit  Medication Sig Dispense Refill  . albuterol (PROVENTIL HFA;VENTOLIN HFA) 108 (90 Base) MCG/ACT inhaler Inhale into the lungs every 6 (six) hours as needed for wheezing or shortness of breath.    06/25/20 albuterol (PROVENTIL) (2.5 MG/3ML) 0.083% nebulizer solution Take 2.5 mg by nebulization every 6 (six) hours as needed for wheezing or shortness of breath.    . allopurinol (ZYLOPRIM) 300 MG tablet Take 300 mg by mouth daily.    04/10/20 aspirin EC 81 MG tablet Take 1 tablet (81 mg total) by mouth daily. Swallow whole. 30 tablet 11  . buPROPion (WELLBUTRIN XL) 150 MG 24 hr tablet Take 150 mg by mouth every morning.    . carvedilol (COREG) 25 MG tablet Take 1 tablet (25 mg total) by mouth 2 (two) times daily. 180 tablet  3  . clopidogrel (PLAVIX) 75 MG tablet Take 1 tablet (75 mg total) by mouth daily. 90 tablet 3  . cyclobenzaprine (FLEXERIL) 10 MG tablet Take 0.5 tablets (5 mg total) by mouth 2 (two) times daily as needed for muscle spasms. (Patient taking differently: Take 10 mg by mouth 3 (three) times daily.) 15 tablet 0  . fluticasone (FLONASE) 50 MCG/ACT nasal spray Place 1 spray into both nostrils daily.    . furosemide (LASIX) 20 MG tablet Take 1 tablet (20 mg total) by mouth daily as needed for fluid or edema (weight gain of 3 lbs or more in a day or 5 lbs in a week). 30 tablet 3  . gabapentin (NEURONTIN) 300 MG capsule Take 300 mg by mouth 3 (three) times daily.    Marland Kitchen HYDROmorphone (DILAUDID) 2 MG tablet Take 2 mg by mouth 3 (three) times daily.    . insulin glargine (LANTUS) 100 UNIT/ML Solostar Pen Inject 20 Units into the skin daily. (Patient  taking differently: Inject 20 Units into the skin every evening.) 15 mL 0  . isosorbide mononitrate (IMDUR) 30 MG 24 hr tablet Take 1 tablet (30 mg total) by mouth daily. 30 tablet 11  . levETIRAcetam (KEPPRA) 500 MG tablet Take 1 tablet (500 mg total) by mouth 2 (two) times daily. 60 tablet 0  . mometasone-formoterol (DULERA) 200-5 MCG/ACT AERO Inhale 2 puffs into the lungs 2 (two) times daily.    . montelukast (SINGULAIR) 10 MG tablet Take 10 mg by mouth at bedtime.    . nitroGLYCERIN (NITRODUR - DOSED IN MG/24 HR) 0.2 mg/hr patch Place 0.2 mg onto the skin daily as needed (shoulder pains).    . nitroGLYCERIN (NITROSTAT) 0.4 MG SL tablet Place 1 tablet (0.4 mg total) under the tongue every 5 (five) minutes as needed for chest pain. 25 tablet 11  . nortriptyline (PAMELOR) 25 MG capsule Take 25 mg by mouth at bedtime.    . pantoprazole (PROTONIX) 40 MG tablet Take 40 mg by mouth daily.    Marland Kitchen PENTIPS 32G X 4 MM MISC See admin instructions. with insulin    . rosuvastatin (CRESTOR) 20 MG tablet Take 1 tablet (20 mg total) by mouth daily. 90 tablet 0  . sacubitril-valsartan (ENTRESTO) 24-26 MG Take 1 tablet by mouth 2 (two) times daily. 60 tablet 0  . spironolactone (ALDACTONE) 25 MG tablet Take 0.5 tablets (12.5 mg total) by mouth every other day. 60 tablet 1  . triamcinolone (KENALOG) 0.1 % Apply 1 application topically 2 (two) times daily as needed (irritation).    Marland Kitchen zolpidem (AMBIEN) 10 MG tablet Take 10 mg by mouth at bedtime.     Current Facility-Administered Medications on File Prior to Visit  Medication Dose Route Frequency Provider Last Rate Last Admin  . regadenoson (LEXISCAN) injection SOLN 0.4 mg  0.4 mg Intravenous Once O'Neal, Ronnald Ramp, MD        Allergies  Allergen Reactions  . Azithromycin     Blisters in mouth   . Lodine [Etodolac] Anaphylaxis    Blood pressure (!) 88/62, pulse 89, resp. rate 16, height 6\' 2"  (1.88 m), weight 240 lb (108.9 kg), SpO2 95 %.  Chronic  systolic heart failure (HCC) Patient with chronic systolic failure (EF 30-35%) currently on GDMT.  Unfortunately his blood pressure remains low on this regimen, even after moving spironolactone to every other day.  In order to keep him on all medications, will decrease the carvedilol to 12.5 mg twice daily in hopes  of bring his pressure up somewhat.  If this doesn't work, we will have to consider switching Entresto to low dose losartan or stopping the spironolactone altogether.  He is scheduled to see Dr. Flora Lipps later this month and we can see him after that should there be further need for medication titrations.     Phillips Hay PharmD CPP [facility] [address] [place] [address] 04/29/2020 7:56 AM

## 2020-04-25 NOTE — Patient Instructions (Signed)
Diabetic Shoes can be obtained at:  [facility] Address: [address] [place], [place], [place] [address] Phone: [phone]

## 2020-04-28 NOTE — Progress Notes (Signed)
  Subjective:  Patient ID: Micheal Bell, male    DOB: [birth year],  MRN: [id number]  Chief Complaint  Patient presents with  . Diabetes    Foot check     50 y.o. male returns with the above complaint. History confirmed with patient.  I have not seen him for several months, he was hospitalized with COVID-19 and then suffered a stroke and heart attack.  He was in the hospital for several months.  His foot ulcer has healed.  He complains today of elongated thickened yellowed toenails that are difficult to cut  Objective:  Physical Exam: warm, good capillary refill, normal DP and PT pulses, reduced sensation grossly throughout the left lower extremity, onychomycosis x10 with yellow and elongated thickened nail plates Left Foot: C-shaped flap type with prominent fifth metatarsal base, ulceration is healed     Assessment:   1. Closed displaced fracture of fourth metatarsal bone of left foot, initial encounter   2. Type 2 diabetes mellitus with diabetic polyneuropathy, without long-term current use of insulin (HCC)   3. Metatarsus adductus   4. Charcot's arthropathy, diabetic (HCC)      Plan:  Patient was evaluated and treated and all questions answered.  Patient educated on diabetes. Discussed proper diabetic foot care and discussed risks and complications of disease. Educated patient in depth on reasons to return to the office immediately should he/she discover anything concerning or new on the feet. All questions answered. Discussed proper shoes as well.   Prescription for diabetic multidensity inserts and extra-depth shoes given to him to get from [place] in [place]  Discussed the etiology and treatment options for the condition in detail with the patient. Educated patient on the topical and oral treatment options for mycotic nails. Recommended debridement of the nails today. Sharp and mechanical debridement performed of all painful and mycotic nails today. Nails  debrided in length and thickness using a nail nipper to level of comfort. Discussed treatment options including appropriate shoe gear. Follow up as needed for painful nails.     Return in about 3 months (around 07/26/2020).    Sharl Ma, DPM 04/28/2020

## 2020-04-29 ENCOUNTER — Encounter: Payer: Self-pay | Admitting: Pharmacist Clinician (PhC)/ Clinical Pharmacy Specialist

## 2020-04-29 NOTE — Assessment & Plan Note (Signed)
Patient with chronic systolic failure (EF 30-35%) currently on GDMT.  Unfortunately his blood pressure remains low on this regimen, even after moving spironolactone to every other day.  In order to keep him on all medications, will decrease the carvedilol to 12.5 mg twice daily in hopes of bring his pressure up somewhat.  If this doesn't work, we will have to consider switching Entresto to low dose losartan or stopping the spironolactone altogether.  He is scheduled to see Dr. Flora Lipps later this month and we can see him after that should there be further need for medication titrations.

## 2020-04-30 ENCOUNTER — Other Ambulatory Visit: Payer: Self-pay

## 2020-04-30 DIAGNOSIS — E119 Type 2 diabetes mellitus without complications: Secondary | ICD-10-CM | POA: Diagnosis not present

## 2020-04-30 DIAGNOSIS — Z7982 Long term (current) use of aspirin: Secondary | ICD-10-CM | POA: Diagnosis not present

## 2020-04-30 DIAGNOSIS — Z794 Long term (current) use of insulin: Secondary | ICD-10-CM | POA: Diagnosis not present

## 2020-04-30 DIAGNOSIS — Z7984 Long term (current) use of oral hypoglycemic drugs: Secondary | ICD-10-CM | POA: Diagnosis not present

## 2020-04-30 NOTE — Progress Notes (Signed)
Encounter not needed.  Micheal Bell was approved and sent to pharmacy 03/03

## 2020-05-01 ENCOUNTER — Encounter: Payer: Self-pay | Admitting: Neurology

## 2020-05-01 ENCOUNTER — Ambulatory Visit: Payer: Medicaid Other | Admitting: Neurology

## 2020-05-01 VITALS — BP 107/77 | HR 107 | Ht 74.0 in | Wt 234.6 lb

## 2020-05-01 DIAGNOSIS — U071 COVID-19: Secondary | ICD-10-CM

## 2020-05-01 DIAGNOSIS — G5621 Lesion of ulnar nerve, right upper limb: Secondary | ICD-10-CM

## 2020-05-01 DIAGNOSIS — R29898 Other symptoms and signs involving the musculoskeletal system: Secondary | ICD-10-CM | POA: Diagnosis not present

## 2020-05-01 DIAGNOSIS — I639 Cerebral infarction, unspecified: Secondary | ICD-10-CM

## 2020-05-01 NOTE — Patient Instructions (Addendum)
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  I recommend we check MRI scan of the brain for follow-up as well as EMG nerve conduction study.  Continue aspirin 81 mg and Plavix 75 mg daily for stroke prevention given his history of recent drug-coated cardiac stents.  Maintain aggressive risk factor modification with strict control of hypertension with blood pressure goal below 130/90, lipids with LDL cholesterol goal below 70 mg percent and diabetes with hemoglobin A1c goal below 6.5%.  I also encouraged him to go for outpatient physical and occupational therapy.  He will return for follow-up in the future in 3 months or call earlier if necessary. Stroke Prevention Some medical conditions and behaviors are associated with a higher chance of having a stroke. You can help prevent a stroke by making nutrition, lifestyle, and other changes, including managing any medical conditions you may have. What nutrition changes can be made?  Eat healthy foods. You can do this by: ? Choosing foods high in fiber, such as fresh fruits and vegetables and whole grains. ? Eating at least 5 or more servings of fruits and vegetables a day. Try to fill half of your plate at each meal with fruits and vegetables. ? Choosing lean protein foods, such as lean cuts of meat, poultry without skin, fish, tofu, beans, and nuts. ? Eating low-fat dairy products. ? Avoiding foods that are high in salt (sodium). This can help lower blood pressure. ? Avoiding foods that have saturated fat, trans fat, and cholesterol. This can help prevent high cholesterol. ? Avoiding processed and premade foods.  Follow your health care provider's specific guidelines for losing weight, controlling high blood pressure (hypertension), lowering high cholesterol, and managing diabetes. These may include: ? Reducing your daily calorie intake. ? Limiting  your daily sodium intake to 1,500 milligrams (mg). ? Using only healthy fats for cooking, such as olive oil, canola oil, or sunflower oil. ? Counting your daily carbohydrate intake.   What lifestyle changes can be made?  Maintain a healthy weight. Talk to your health care provider about your ideal weight.  Get at least 30 minutes of moderate physical activity at least 5 days a week. Moderate activity includes brisk walking, biking, and swimming.  Do not use any products that contain nicotine or tobacco, such as cigarettes and e-cigarettes. If you need help quitting, ask your health care provider. It may also be helpful to avoid exposure to secondhand smoke.  Limit alcohol intake to no more than 1 drink a day for nonpregnant women and 2 drinks a day for men. One drink equals 12 oz of beer, 5 oz of wine, or 1 oz of hard liquor.  Stop any illegal drug use.  Avoid taking birth control pills. Talk to your health care provider about the risks of taking birth control pills if: ? You are over 30 years old. ? You smoke. ? You get migraines. ? You have ever had a blood clot. What other changes can be made?  Manage your cholesterol levels. ? Eating a healthy diet is important for preventing high cholesterol. If cholesterol cannot be managed through diet alone, you may also need to take medicines. ? Take any prescribed medicines to control your cholesterol as told by your health care provider.  Manage your diabetes. ? Eating a healthy diet and exercising regularly are important parts of managing your blood sugar. If your blood  sugar cannot be managed through diet and exercise, you may need to take medicines. ? Take any prescribed medicines to control your diabetes as told by your health care provider.  Control your hypertension. ? To reduce your risk of stroke, try to keep your blood pressure below 130/80. ? Eating a healthy diet and exercising regularly are an important part of controlling your  blood pressure. If your blood pressure cannot be managed through diet and exercise, you may need to take medicines. ? Take any prescribed medicines to control hypertension as told by your health care provider. ? Ask your health care provider if you should monitor your blood pressure at home. ? Have your blood pressure checked every year, even if your blood pressure is normal. Blood pressure increases with age and some medical conditions.  Get evaluated for sleep disorders (sleep apnea). Talk to your health care provider about getting a sleep evaluation if you snore a lot or have excessive sleepiness.  Take over-the-counter and prescription medicines only as told by your health care provider. Aspirin or blood thinners (antiplatelets or anticoagulants) may be recommended to reduce your risk of forming blood clots that can lead to stroke.  Make sure that any other medical conditions you have, such as atrial fibrillation or atherosclerosis, are managed. What are the warning signs of a stroke? The warning signs of a stroke can be easily remembered as BEFAST.  B is for balance. Signs include: ? Dizziness. ? Loss of balance or coordination. ? Sudden trouble walking.  E is for eyes. Signs include: ? A sudden change in vision. ? Trouble seeing.  F is for face. Signs include: ? Sudden weakness or numbness of the face. ? The face or eyelid drooping to one side.  A is for arms. Signs include: ? Sudden weakness or numbness of the arm, usually on one side of the body.  S is for speech. Signs include: ? Trouble speaking (aphasia). ? Trouble understanding.  T is for time. ? These symptoms may represent a serious problem that is an emergency. Do not wait to see if the symptoms will go away. Get medical help right away. Call your local emergency services (911 in the [place] Do not drive yourself to the hospital.  Other signs of stroke may include: ? A sudden, severe headache with no known  cause. ? Nausea or vomiting. ? Seizure. Where to find more information For more information, visit:  American Stroke Association: www.strokeassociation.org  National Stroke Association: www.stroke.org Summary  You can prevent a stroke by eating healthy, exercising, not smoking, limiting alcohol intake, and managing any medical conditions you may have.  Do not use any products that contain nicotine or tobacco, such as cigarettes and e-cigarettes. If you need help quitting, ask your health care provider. It may also be helpful to avoid exposure to secondhand smoke.  Remember BEFAST for warning signs of stroke. Get help right away if you or a loved one has any of these signs. This information is not intended to replace advice given to you by your health care provider. Make sure you discuss any questions you have with your health care provider. Document Revised: 01/22/2017 Document Reviewed: 03/17/2016 Elsevier Patient Education  2021 [employer].  stroke as well as right hand weakness and ulnar nerve palsy, risk for recurrent stroke/TIAs, personally independently reviewed imaging studies and stroke evaluation results and answered questions.Continue aspirin 81 mg and Plavix 75 mg daily for secondary stroke prevention given his recent cardiac stents and maintain  strict control of hypertension with blood pressure goal below 130/90, diabetes with hemoglobin A1c goal below 6.5% and lipids with LDL cholesterol goal below 70 mg/dL. I also advised the patient to eat a healthy diet with plenty of whole grains, cereals, fruits and vegetables, exercise regularly and maintain ideal body weight.  I recommend we check EMG nerve conduction study and repeat MRI scan of the brain with and without contrast.  Refer for outpatient physical and occupational therapy.  Followup in the future with me in 3 months or call earlier if necessary.

## 2020-05-01 NOTE — Progress Notes (Signed)
[facility] [address] [place]. [place] [address] [phone]       OFFICE CONSULT NOTE  Micheal Bell Date of Birth:  1970-08-06 Medical Record Number:  [id number]   Referring MD:  Benedetto Goad  Reason for Referral: Stroke  HPI: Micheal Bell is a 50 year old Caucasian male with history of diabetes, hypertension, gout, irritable bowel syndrome, fibromyalgia, obesity who is seen today for office consultation visit for his stroke and hand weakness.  History is obtained from the patient and his uncle who is accompanying him as well as review of electronic medical records and I have personally reviewed pertinent imaging films in PACS. Patient initially was admitted on 10/08/2020 for Covid pneumonia to the floor and decompensated on 10/11/2020 requiring intubation.  He self extubated himself on 10/15/2020 but required reintubation.  He subsequently was extubated on 10/20/2020 and a typical therapy for Covid including steroids, twice daily CT neb and remdesivir.  Hospitalization was complicated by MSSA pneumonia and group B Streptococcus bacteremia due to left foot abscess.  MRI of the foot showed left foot abscess.  And systolic heart failure.  TEE was negative for vegetations.  He developed some right-sided weakness and confusion and MRI scan of the brain was obtained on 10/25/2019 which showed a 17 mm focus of restricted diffusion in the left frontoparietal subcortical region which showed ring like enhancement on postcontrast raising suspicion for subacute infarct versus cerebritis.  MR angiogram of the brain on 10/29/2019 showed no large vessel stenosis or occlusion.  EEG showed mild diffuse slowing without definite epileptiform activity.  He was started on Keppra for seizure prophylaxis which she seems to be tolerating well without side effects.  Patient was not on aspirin at the time.  Patient states he initially did well and recovered his right upper extremity strength after  discharge.  However he was admitted with chest pain on 02/20/2020 and was found to have multivessel coronary artery disease and underwent emergent 3 coronary stents.  Following which she had severe pain in the right elbow with swelling and subsequently right hand weakness.  He is now developed clawing of the right ulnar fingers with inability to extend them with weakness and numbness that.  He did finish home physical and occupational therapy.  Is able to ambulate independently but still has persistent right hand weakness and slight dragging of the right leg.  He has not undergone any outpatient therapies.  He states he has no further seizures. ROS:   14 system review of systems is positive for  Weakness, numbness, gait difficulty, decreased sensation all other systems negative PMH:  Past Medical History:  Diagnosis Date   Arthritis    Asthma    Coronary artery disease    Diabetes mellitus without complication (HCC)    DVT (deep venous thrombosis) (HCC)    Fibromyalgia    Gout    Hypertension    IBS (irritable bowel syndrome)    Vein disorder     Social History:  Social History   Socioeconomic History   Marital status: Single    Spouse name: Not on file   Number of children: Not on file   Years of education: Not on file   Highest education level: Not on file  Occupational History   Occupation: disabled  Tobacco Use   Smoking status: Never Smoker   Smokeless tobacco: Current User    Types: Snuff  Vaping Use   Vaping Use: Never used  Substance and Sexual Activity  Alcohol use: Never   Drug use: Never   Sexual activity: Not on file  Other Topics Concern   Not on file  Social History Narrative   Lives alone, mom is next door   Right handed   Drinks no caffeine   Social Determinants of Health   Financial Resource Strain: Not on file  Food Insecurity: Not on file  Transportation Needs: Not on file  Physical Activity: Not on file  Stress: Not on  file  Social Connections: Not on file  Intimate Partner Violence: Not on file    Medications:   Current Outpatient Medications on File Prior to Visit  Medication Sig Dispense Refill   albuterol (PROVENTIL HFA;VENTOLIN HFA) 108 (90 Base) MCG/ACT inhaler Inhale into the lungs every 6 (six) hours as needed for wheezing or shortness of breath.     albuterol (PROVENTIL) (2.5 MG/3ML) 0.083% nebulizer solution Take 2.5 mg by nebulization every 6 (six) hours as needed for wheezing or shortness of breath.     allopurinol (ZYLOPRIM) 300 MG tablet Take 300 mg by mouth daily.     aspirin EC 81 MG tablet Take 1 tablet (81 mg total) by mouth daily. Swallow whole. 30 tablet 11   buPROPion (WELLBUTRIN XL) 150 MG 24 hr tablet Take 150 mg by mouth every morning.     carvedilol (COREG) 25 MG tablet Take 1 tablet (25 mg total) by mouth 2 (two) times daily. 180 tablet 3   clopidogrel (PLAVIX) 75 MG tablet Take 1 tablet (75 mg total) by mouth daily. 90 tablet 3   cyclobenzaprine (FLEXERIL) 10 MG tablet Take 0.5 tablets (5 mg total) by mouth 2 (two) times daily as needed for muscle spasms. (Patient taking differently: Take 10 mg by mouth 3 (three) times daily.) 15 tablet 0   empagliflozin (JARDIANCE) 10 MG TABS tablet Take 1 tablet (10 mg total) by mouth daily before breakfast. 90 tablet 3   fluticasone (FLONASE) 50 MCG/ACT nasal spray Place 1 spray into both nostrils daily.     furosemide (LASIX) 20 MG tablet Take 1 tablet (20 mg total) by mouth daily as needed for fluid or edema (weight gain of 3 lbs or more in a day or 5 lbs in a week). 30 tablet 3   gabapentin (NEURONTIN) 300 MG capsule Take 300 mg by mouth 3 (three) times daily.     HYDROmorphone (DILAUDID) 2 MG tablet Take 2 mg by mouth 3 (three) times daily.     insulin glargine (LANTUS) 100 UNIT/ML Solostar Pen Inject 20 Units into the skin daily. (Patient taking differently: Inject 20 Units into the skin every evening.) 15 mL 0   isosorbide  mononitrate (IMDUR) 30 MG 24 hr tablet Take 1 tablet (30 mg total) by mouth daily. 30 tablet 11   levETIRAcetam (KEPPRA) 500 MG tablet Take 1 tablet (500 mg total) by mouth 2 (two) times daily. 60 tablet 0   mometasone-formoterol (DULERA) 200-5 MCG/ACT AERO Inhale 2 puffs into the lungs 2 (two) times daily.     montelukast (SINGULAIR) 10 MG tablet Take 10 mg by mouth at bedtime.     nitroGLYCERIN (NITRODUR - DOSED IN MG/24 HR) 0.2 mg/hr patch Place 0.2 mg onto the skin daily as needed (shoulder pains).     nortriptyline (PAMELOR) 25 MG capsule Take 25 mg by mouth at bedtime.     pantoprazole (PROTONIX) 40 MG tablet Take 40 mg by mouth daily.     PENTIPS 32G X 4 MM MISC See admin instructions.  with insulin     rosuvastatin (CRESTOR) 20 MG tablet Take 1 tablet (20 mg total) by mouth daily. 90 tablet 0   sacubitril-valsartan (ENTRESTO) 24-26 MG Take 1 tablet by mouth 2 (two) times daily. 60 tablet 0   spironolactone (ALDACTONE) 25 MG tablet Take 0.5 tablets (12.5 mg total) by mouth every other day. 60 tablet 1   triamcinolone (KENALOG) 0.1 % Apply 1 application topically 2 (two) times daily as needed (irritation).     zolpidem (AMBIEN) 10 MG tablet Take 10 mg by mouth at bedtime.     nitroGLYCERIN (NITROSTAT) 0.4 MG SL tablet Place 1 tablet (0.4 mg total) under the tongue every 5 (five) minutes as needed for chest pain. 25 tablet 11   Current Facility-Administered Medications on File Prior to Visit  Medication Dose Route Frequency Provider Last Rate Last Admin   regadenoson (LEXISCAN) injection SOLN 0.4 mg  0.4 mg Intravenous Once O'Neal, Ronnald Ramp, MD        Allergies:   Allergies  Allergen Reactions   Azithromycin     Blisters in mouth    Lodine [Etodolac] Anaphylaxis    Physical Exam General: well developed, well nourished middle-aged mildly obese Caucasian male, seated, in no evident distress Head: head normocephalic and atraumatic.   Neck: supple with no carotid  or supraclavicular bruits Cardiovascular: regular rate and rhythm, no murmurs Musculoskeletal: no deformity Skin:  no rash/petichiae Vascular:  Normal pulses all extremities  Neurologic Exam Mental Status: Awake and fully alert. Oriented to place and time. Recent and remote memory intact. Attention span, concentration and fund of knowledge appropriate. Mood and affect appropriate.  Cranial Nerves: Fundoscopic exam reveals sharp disc margins. Pupils equal, briskly reactive to light. Extraocular movements full without nystagmus. Visual fields full to confrontation. Hearing intact. Facial sensation intact. Face, tongue, palate moves normally and symmetrically.  Motor: Diminished bulk in the right hand intrinsic muscles lumbricals and interossei with wasting.  Clawing of the ulnar 3 digits with nonfixed contractures.  Weakness of right grip and intrinsic hand muscles.  Normal strength in other muscles.  Slight increased tone in the right leg with weakness of ankle dorsiflexors and hip flexors.. Sensory.:  Diminished touch pinprick sensation in the right hand in the ulnar nerve mediated dermatome.  Positive Tinel sign over the right ulnar groove Coordination: Rapid alternating movements normal in all extremities. Finger-to-nose and heel-to-shin performed accurately bilaterally. Gait and Station: Arises from chair without difficulty. Stance is normal. Gait demonstrates slight dragging of the right leg and stiffness.  Not able to heel, toe and tandem walk without difficulty.  Reflexes: 1+ and symmetric. Toes downgoing.   NIHSS  0 Modified Rankin  2   ASSESSMENT: 50 year old Caucasian male with Covid infection in August 2020 complicated by Covid pneumonia, sepsis and left frontal small subcortical infarct etiology septic emboli versus Covid related hypercoagulability.  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  I recommend we check MRI scan of the brain for follow-up as well as EMG nerve conduction study.  Continue aspirin 81 mg and Plavix 75 mg daily for stroke prevention given his history of recent drug-coated cardiac stents.  Maintain aggressive risk factor modification with strict control of hypertension with blood pressure goal below 130/90, lipids with LDL cholesterol goal below 70  mg percent and diabetes with hemoglobin A1c goal below 6.5%.  I also encouraged him to go for outpatient physical and occupational therapy.  He will return for follow-up in the future in 3 months or call earlier if necessary.  Greater than 50% time during this 45-minute consultation visit was spent on counseling and coordination of care about his recent Covid related illness and stroke versus infection and answering questions Delia Heady, MD Note: This document was prepared with digital dictation and possible smart phrase technology. Any transcriptional errors that result from this process are unintentional.

## 2020-05-02 ENCOUNTER — Telehealth: Payer: Self-pay | Admitting: Neurology

## 2020-05-02 NOTE — Telephone Encounter (Signed)
[facility] pending faxed notes.  °

## 2020-05-06 DIAGNOSIS — M25511 Pain in right shoulder: Secondary | ICD-10-CM | POA: Diagnosis not present

## 2020-05-06 DIAGNOSIS — M25562 Pain in left knee: Secondary | ICD-10-CM | POA: Diagnosis not present

## 2020-05-06 DIAGNOSIS — M25521 Pain in right elbow: Secondary | ICD-10-CM | POA: Diagnosis not present

## 2020-05-06 NOTE — Telephone Encounter (Signed)
Checked the status on the portal it is still pending.  

## 2020-05-07 NOTE — Telephone Encounter (Signed)
schedule for 3/23 9AM

## 2020-05-07 NOTE — Telephone Encounter (Signed)
[facility]: [id number] (exp. 05/02/20 to 07/01/20) order faxed to [facility] bc that is who is in network with the pt plan they will reach out to the patient to schedule

## 2020-05-13 ENCOUNTER — Encounter: Payer: Medicaid Other | Admitting: Neurology

## 2020-05-13 ENCOUNTER — Ambulatory Visit (INDEPENDENT_AMBULATORY_CARE_PROVIDER_SITE_OTHER): Payer: Medicaid Other | Admitting: Neurology

## 2020-05-13 ENCOUNTER — Other Ambulatory Visit: Payer: Self-pay | Admitting: Cardiovascular Disease

## 2020-05-13 DIAGNOSIS — R202 Paresthesia of skin: Secondary | ICD-10-CM | POA: Diagnosis not present

## 2020-05-13 DIAGNOSIS — R200 Anesthesia of skin: Secondary | ICD-10-CM

## 2020-05-13 DIAGNOSIS — G5621 Lesion of ulnar nerve, right upper limb: Secondary | ICD-10-CM

## 2020-05-13 DIAGNOSIS — Z0289 Encounter for other administrative examinations: Secondary | ICD-10-CM

## 2020-05-13 NOTE — Progress Notes (Signed)
Cardiology Office Note:   Date:  05/15/2020  NAME:  Micheal GibbonsJesse A Bell    MRN: [id number] DOB:  03-14-1970   PCP:  Micheal Bell, Fred H, MD  Cardiologist:  Reatha HarpsWesley T O'Neal, MD   Referring MD: Micheal Bell, Fred H, MD   Chief Complaint  Patient presents with  . Congestive Heart Failure   History of Present Illness:   Micheal Bell is a 50 y.o. male with a hx of 3v CAD s/p PCI, systolic HF, DM, HTN, HLD, who presents for follow-up. BP low on GDMT.   He remains on Imdur.  We can likely stop this given his revascularization.  I think this will help his blood pressure.  BP 100/70.  Weights are stable around 233 pounds.  He is taking Lasix as needed.  He may take it every other day.  He is on Coreg 25 twice a day, Entresto 24-26 mg twice daily, Aldactone 12.5 mg every other day.  He is also on Jardiance 10 mg daily.  No bleeding on DAPT.  He needs to continue this for at least 6 months in total.  I likely will continue him indefinitely.  He denies any exertional chest pain or pressure.  He does get short of breath when he does activity.  No increased lower extremity edema.  He is working on his diabetes.  He is still using smokeless tobacco.  He was advised to quit this.  He does need repeat labs today.  Crestor was decreased as well.   Problem List 1. CAD -abnormal stress/cardiomyopathy 01/10/2020 -3v CAD 02/12/2020: 90% PDA, 90% LCX; 95% LAD -CABG deferred due to deconditioning -2v PCI -> LAD/LCX 02/20/2020 2. CHF -EF 30-35% in setting of sepsis  -EF improved to 55% at discharge (stress related) -insetting of MSSA sepsis/foot abscess/covid 19 PNA/cerebritis 3. DM -A1c 11.3 4. HTN 5. CVA -prior L frontal lobe 6. HLD -T chol 168, HDL 43, LDL 99, TG 130 7. DVT -R popliteal vein eliquis started 11/11/2019 -completed 3 months therapy  8. Smokeless tobacco -Dip user for 45 years   Past Medical History: Past Medical History:  Diagnosis Date  . Arthritis   . Asthma   . Coronary artery disease    . Diabetes mellitus without complication (HCC)   . DVT (deep venous thrombosis) (HCC)   . Fibromyalgia   . Gout   . Hypertension   . IBS (irritable bowel syndrome)   . Vein disorder     Past Surgical History: Past Surgical History:  Procedure Laterality Date  . CORONARY ATHERECTOMY N/A 02/20/2020   Procedure: CORONARY ATHERECTOMY;  Surgeon: SwazilandJordan, Micheal M, MD;  [facility];  Service: Cardiovascular;  Laterality: N/A;  CSI LAD  . CORONARY STENT INTERVENTION N/A 02/20/2020   Procedure: CORONARY STENT INTERVENTION;  Surgeon: SwazilandJordan, Micheal M, MD;  [facility];  Service: Cardiovascular;  Laterality: N/A;  LAD CFX  . FOOT SURGERY    . INTRAVASCULAR ULTRASOUND/IVUS N/A 02/20/2020   Procedure: Intravascular Ultrasound/IVUS;  Surgeon: SwazilandJordan, Micheal M, MD;  [facility];  Service: Cardiovascular;  Laterality: N/A;  . LEFT HEART CATH AND CORONARY ANGIOGRAPHY N/A 02/12/2020   Procedure: LEFT HEART CATH AND CORONARY ANGIOGRAPHY;  Surgeon: Tonny Bollmanooper, Michael, MD;  [facility];  Service: Cardiovascular;  Laterality: N/A;  . TEE WITHOUT CARDIOVERSION N/A 10/24/2019   Procedure: TRANSESOPHAGEAL ECHOCARDIOGRAM (TEE);  Surgeon: Wendall StadeNishan, Micheal C, MD;  [facility];  Service: Cardiovascular;  Laterality: N/A;  Current Medications: Current Meds  Medication Sig  . albuterol (PROVENTIL HFA;VENTOLIN HFA) 108 (90 Base) MCG/ACT inhaler Inhale into the lungs every 6 (six) hours as needed for wheezing or shortness of breath.  Micheal Bell albuterol (PROVENTIL) (2.5 MG/3ML) 0.083% nebulizer solution Take 2.5 mg by nebulization every 6 (six) hours as needed for wheezing or shortness of breath.  . allopurinol (ZYLOPRIM) 300 MG tablet Take 300 mg by mouth daily.  Micheal Bell aspirin EC 81 MG tablet Take 1 tablet (81 mg total) by mouth daily. Swallow whole.  Micheal Bell buPROPion (WELLBUTRIN XL) 150 MG 24 hr tablet Take 150 mg by mouth every morning.  . cyclobenzaprine  (FLEXERIL) 10 MG tablet Take 0.5 tablets (5 mg total) by mouth 2 (two) times daily as needed for muscle spasms. (Patient taking differently: Take 10 mg by mouth 3 (three) times daily.)  . fluticasone (FLONASE) 50 MCG/ACT nasal spray Place 1 spray into both nostrils daily.  Micheal Bell gabapentin (NEURONTIN) 300 MG capsule Take 300 mg by mouth 3 (three) times daily.  Micheal Bell HYDROmorphone (DILAUDID) 2 MG tablet Take 2 mg by mouth 3 (three) times daily.  . insulin glargine (LANTUS) 100 UNIT/ML Solostar Pen Inject 20 Units into the skin daily. (Patient taking differently: Inject 20 Units into the skin every evening.)  . levETIRAcetam (KEPPRA) 500 MG tablet Take 1 tablet (500 mg total) by mouth 2 (two) times daily.  . mometasone-formoterol (DULERA) 200-5 MCG/ACT AERO Inhale 2 puffs into the lungs 2 (two) times daily.  . montelukast (SINGULAIR) 10 MG tablet Take 10 mg by mouth at bedtime.  . nortriptyline (PAMELOR) 25 MG capsule Take 25 mg by mouth at bedtime.  . pantoprazole (PROTONIX) 40 MG tablet Take 40 mg by mouth daily.  Micheal Bell PENTIPS 32G X 4 MM MISC See admin instructions. with insulin  . triamcinolone (KENALOG) 0.1 % Apply 1 application topically 2 (two) times daily as needed (irritation).  Micheal Bell zolpidem (AMBIEN) 10 MG tablet Take 10 mg by mouth at bedtime.  . [DISCONTINUED] carvedilol (COREG) 25 MG tablet Take 1 tablet (25 mg total) by mouth 2 (two) times daily.  . [DISCONTINUED] clopidogrel (PLAVIX) 75 MG tablet Take 1 tablet (75 mg total) by mouth daily.  . [DISCONTINUED] empagliflozin (JARDIANCE) 10 MG TABS tablet Take 1 tablet (10 mg total) by mouth daily before breakfast.  . [DISCONTINUED] ENTRESTO 24-26 MG TAKE ONE TABLET BY MOUTH TWICE DAILY  . [DISCONTINUED] furosemide (LASIX) 20 MG tablet Take 1 tablet (20 mg total) by mouth daily as needed for fluid or edema (weight gain of 3 lbs or more in a day or 5 lbs in a week).  . [DISCONTINUED] isosorbide mononitrate (IMDUR) 30 MG 24 hr tablet Take 1 tablet (30 mg  total) by mouth daily.  . [DISCONTINUED] nitroGLYCERIN (NITRODUR - DOSED IN MG/24 HR) 0.2 mg/hr patch Place 0.2 mg onto the skin daily as needed (shoulder pains).  . [DISCONTINUED] rosuvastatin (CRESTOR) 20 MG tablet Take 1 tablet (20 mg total) by mouth daily.  . [DISCONTINUED] spironolactone (ALDACTONE) 25 MG tablet Take 0.5 tablets (12.5 mg total) by mouth every other day.     Allergies:    Azithromycin and Lodine [etodolac]   Social History: Social History   Socioeconomic History  . Marital status: Single    Spouse name: Not on file  . Number of children: Not on file  . Years of education: Not on file  . Highest education level: Not on file  Occupational History  . Occupation: disabled  Tobacco Use  .  Smoking status: Never Smoker  . Smokeless tobacco: Current User    Types: Snuff  Vaping Use  . Vaping Use: Never used  Substance and Sexual Activity  . Alcohol use: Never  . Drug use: Never  . Sexual activity: Not on file  Other Topics Concern  . Not on file  Social History Narrative   Lives alone, mom is next door   Right handed   Drinks no caffeine   Social Determinants of Health   Financial Resource Strain: Not on file  Food Insecurity: Not on file  Transportation Needs: Not on file  Physical Activity: Not on file  Stress: Not on file  Social Connections: Not on file     Family History: The patient's family history includes Diabetes in his mother; Heart disease in his father and mother.  ROS:   All other ROS reviewed and negative. Pertinent positives noted in the HPI.     EKGs/Labs/Other Studies Reviewed:   The following studies were personally reviewed by me today:  LHC 02/12/2020  1.  Widely patent left main stent with no stenosis 2.  Patent RCA with mild nonobstructive disease, but severe stenosis of the PDA is present 3.  Severe diffuse stenosis of the left circumflex extending into an obtuse marginal branch (long lesion) 4.  Severe diffuse mid LAD  stenosis and moderate distal LAD stenosis 5.  Moderate segmental LV systolic dysfunction with normal LVEDP  With diabetes and three-vessel coronary artery disease, will place cardiac surgical consultation.  The patient's coronary anatomy would also be amenable to multivessel PCI with treatment of the PDA lesion, left circumflex lesion, and mid LAD lesion.  Recommend heart team approach to his care with outpatient surgical consultation as next step.   Recent Labs: 10/26/2019: TSH 4.137 11/08/2019: ALT 18 11/09/2019: B Natriuretic Peptide 28.8 02/25/2020: Hemoglobin 11.3; Magnesium 1.5; Platelets 292 04/08/2020: BUN 14; Creatinine, Ser 1.07; Potassium 4.6; Sodium 136   Recent Lipid Panel    Component Value Date/Time   CHOL 168 10/26/2019 0402   TRIG 130 10/26/2019 0402   HDL 43 10/26/2019 0402   CHOLHDL 3.9 10/26/2019 0402   VLDL 26 10/26/2019 0402   LDLCALC 99 10/26/2019 0402    Physical Exam:   VS:  BP 100/70   Pulse 73   Ht 6\' 2"  (1.88 Bell)   Wt 233 lb 6.4 oz (105.9 kg)   SpO2 96%   BMI 29.97 kg/Bell    Wt Readings from Last 3 Encounters:  05/15/20 233 lb 6.4 oz (105.9 kg)  05/01/20 234 lb 9.6 oz (106.4 kg)  04/25/20 240 lb (108.9 kg)    General: Well nourished, well developed, in no acute distress Head: Atraumatic, normal size  Eyes: PEERLA, EOMI  Neck: Supple, no JVD Endocrine: No thryomegaly Cardiac: Normal S1, S2; RRR; no murmurs, rubs, or gallops Lungs: Clear to auscultation bilaterally, no wheezing, rhonchi or rales  Abd: Soft, nontender, no hepatomegaly  Ext: No edema, pulses 2+ Musculoskeletal: No deformities, BUE and BLE strength normal and equal Skin: Warm and dry, no rashes   Neuro: Alert and oriented to person, place, time, and situation, CNII-XII grossly intact, no focal deficits  Psych: Normal mood and affect   ASSESSMENT:   WENZEL BACKLUND is a 50 y.o. male who presents for the following: 1. Chronic systolic heart failure (HCC)   2. Coronary artery disease  involving native coronary artery of native heart without angina pectoris   3. Hyperlipidemia with target LDL less than 70  4. Primary hypertension   5. Tobacco abuse     PLAN:   1. Chronic systolic heart failure (HCC) -EF 30-35%.  In the setting of multivessel CAD.  Status post revascularization.  Need to trial guideline directed medical therapy. -Euvolemic today. -BP has been low.  We will stop his Imdur. -Continue Coreg 25 twice a day, Entresto 24-26 mg twice daily, Aldactone 12.5 mg every other day.  On Jardiance 10 mg daily.  Hopefully with stopping his Imdur he will have better blood pressure we get him on Aldactone daily.  No symptoms of angina.  Volume status acceptable.  Lasix 20 mg daily as needed.  2. Coronary artery disease involving native coronary artery of native heart without angina pectoris -Abnormal stress test.  Found to have three-vessel CAD.  Was not a candidate for CABG.  Underwent revascularization -2v PCI -> LAD/LCX 02/20/2020 -PCI to the PDA was deferred.  This will be treated medically. -Continue aspirin and Plavix.  Uninterrupted therapy for 6 months.  I likely will recommend indefinite DAPT.  We will see how he does.  No bleeding issues. -On Crestor 20 mg daily.  Repeat lipid today.  3. Hyperlipidemia with target LDL less than 70 -Repeat lipid profile today.  Continue Crestor.  4. Primary hypertension -BP is a bit soft.  Soft and.  5. Tobacco abuse -Smoking cessation counseling provided.  Disposition: Return in about 3 months (around 08/15/2020).  Medication Adjustments/Labs and Tests Ordered: Current medicines are reviewed at length with the patient today.  Concerns regarding medicines are outlined above.  Orders Placed This Encounter  Procedures  . Basic metabolic panel  . Lipid panel  . ECHOCARDIOGRAM COMPLETE   Meds ordered this encounter  Medications  . spironolactone (ALDACTONE) 25 MG tablet    Sig: Take 0.5 tablets (12.5 mg total) by mouth  every other day.    Dispense:  60 tablet    Refill:  1  . rosuvastatin (CRESTOR) 20 MG tablet    Sig: Take 1 tablet (20 mg total) by mouth daily.    Dispense:  90 tablet    Refill:  0  . furosemide (LASIX) 20 MG tablet    Sig: Take 1 tablet (20 mg total) by mouth daily as needed for fluid or edema (weight gain of 3 lbs or more in a day or 5 lbs in a week).    Dispense:  30 tablet    Refill:  3  . sacubitril-valsartan (ENTRESTO) 24-26 MG    Sig: Take 1 tablet by mouth 2 (two) times daily.    Dispense:  60 tablet    Refill:  1  . empagliflozin (JARDIANCE) 10 MG TABS tablet    Sig: Take 1 tablet (10 mg total) by mouth daily before breakfast.    Dispense:  90 tablet    Refill:  3    PA approved.  . clopidogrel (PLAVIX) 75 MG tablet    Sig: Take 1 tablet (75 mg total) by mouth daily.    Dispense:  90 tablet    Refill:  3  . carvedilol (COREG) 25 MG tablet    Sig: Take 1 tablet (25 mg total) by mouth 2 (two) times daily.    Dispense:  180 tablet    Refill:  3    Patient Instructions  Medication Instructions:  The current medical regimen is effective;  continue present plan and medications.  *If you need a refill on your cardiac medications before your next appointment, please call your pharmacy*  Lab Work: BMET, LIPID today   If you have labs (blood work) drawn today and your tests are completely normal, you will receive your results only by: Micheal Bell MyChart Message (if you have MyChart) OR . A paper copy in the mail If you have any lab test that is abnormal or we need to change your treatment, we will call you to review the results.   Testing/Procedures: Echocardiogram (in 3 months before follow up) - Your physician has requested that you have an echocardiogram. Echocardiography is a painless test that uses sound waves to create images of your heart. It provides your doctor with information about the size and shape of your heart and how well your heart's chambers and valves are  working. This procedure takes approximately one hour. There are no restrictions for this procedure. This will be performed at our [facility] location - [address], [address].    Follow-Up: At [facility], you and your health needs are our priority.  As part of our continuing mission to provide you with exceptional heart care, we have created designated Provider Care Teams.  These Care Teams include your primary Cardiologist (physician) and Advanced Practice Providers (APPs -  Physician Assistants and Nurse Practitioners) who all work together to provide you with the care you need, when you need it.  We recommend signing up for the patient portal called "MyChart".  Sign up information is provided on this After Visit Summary.  MyChart is used to connect with patients for Virtual Visits (Telemedicine).  Patients are able to view lab/test results, encounter notes, upcoming appointments, etc.  Non-urgent messages can be sent to your provider as well.   To learn more about what you can do with MyChart, go to [email or web address].    Your next appointment:   3 month(s)  The format for your next appointment:   In Person  Provider:   Lennie Odor, MD       Time Spent with Patient: I have spent a total of 35 minutes with patient reviewing hospital notes, telemetry, EKGs, labs and examining the patient as well as establishing an assessment and plan that was discussed with the patient.  > 50% of time was spent in direct patient care.  Signed, Lenna Gilford. Flora Lipps, MD, [facility]  [facility]  [address], [address] [place], [place] [address] [phone]  05/15/2020 11:31 AM

## 2020-05-13 NOTE — Procedures (Signed)
Full Name: Micheal Bell Gender: Male MRN #: [id number] Date of Birth: 03-12-1970    Visit Date: 05/13/2020 08:05 Age: 50 Years Examining Physician: Levert Feinstein, MD  Referring Physician: Delia Heady, MD History: 50 year old male with history of many years untreated diabetes, diabetic peripheral neuropathy at baseline, left foot fracture, gait abnormality, was admitted for Covid pneumonia in August 2021, which is also complicated by MRSA pneumonia, left foot abscess, stroke involving left frontoparietal subcortical region, with residual right-sided weakness.  Was also treated for multivessel coronary artery disease by stent on February 20, 2020, he was recently found to have right shoulder fracture, MRI of the right shoulder is pending, since COVID admission in August 2021, required prolonged intubation, he noticed right hand weakness, muscle atrophy, increased lower extremity numbness, and new onset bilateral fingertips paresthesia, worsening gait abnormality On examination: Significant atrophy of right hand intrinsic muscles, limited range of motion of bilateral shoulder due to right shoulder fracture, long history of left rotator cuff.  There was no significant left upper extremity proximal and distal muscle weakness.  He has no significant right wrist flexion, extension weakness, profound right finger abduction weakness, also moderate right abductor pollicis brevis grip weakness.  Mild bilateral hip flexion, right knee flexion, slight right knee extension weakness, mild to moderate bilateral ankle dorsiflexion weakness, right worse than left.  Length dependent sensory change to knee level, areflexia, plantar responses right toe extension, left toe flexion.  He depends on his walker to take a few steps, dragging right leg more, unsteady  Summary of the test: Nerve conduction study: Right sural, bilateral median, ulnar sensory responses were absent.  Right radial sensory response showed mildly  decreased snap amplitude. Right tibial motor responses showed significantly decreased CMAP amplitude, with slow conduction velocity. Right ulnar motor responses were absent.  Left ulnar motor responses were normal.  Left ulnar F-wave latency was significantly prolonged. Right median motor responses showed significantly prolonged distal latency, with significantly decreased CMAP amplitude, with normal conduction velocity. Left median motor responses showed significantly prolonged distal latency, normal CMAP amplitude.  Electromyography: Selected needle examinations were performed at bilateral lower extremity muscles, bilateral lumbosacral paraspinal; bilateral upper extremity muscles and bilateral cervical paraspinal muscles.  The most abnormality was found at right first dorsal interossei, there was significantly increased insertional activity, few enlarged complex motor unit potential with decreased recruitment patterns.  There was also evidence of mild chronic neuropathic changes involving right abductor pollicis brevis, extensor digitorum communis.   In addition, there is also evidence of mild chronic neuropathic changes involving bilateral tibialis anterior, tibialis posterior, peroneal longus, abductor hallucis.  There was no spontaneous activity at bilateral lumbosacral paraspinal muscles and cervical paraspinal muscles.   Conclusion: This is an abnormal study.  There is electrodiagnostic evidence of length dependent severe sensorimotor polyneuropathy, most likely related to his long history of poorly controlled diabetes.  In addition, there is evidence of right upper extremity neuropathy, involving the right ulnar nerve the most, also evidence of involvement of right median nerve, a slight degree of right distal radial nerve.  It was difficult to further localize the lesion, especially for the right ulnar nerve.  There is evidence of moderate left median neuropathy across the wrist,  consistent with moderate left carpal tunnel syndrome.  There is no evidence of active bilateral cervical or lumbosacral radiculopathy.    ------------------------------- Levert Feinstein, M.D. PhD  [facility] [address] [place], [place] [address] Tel: [phone]  Fax: [phone]  Verbal informed consent was obtained from the patient, patient was informed of potential risk of procedure, including bruising, bleeding, hematoma formation, infection, muscle weakness, muscle pain, numbness, among others.        MNC    Nerve / Sites Muscle Latency Ref. Amplitude Ref. Rel Amp Segments Distance Velocity Ref. Area    ms ms mV mV %  cm m/s m/s mVms  R Median - APB     Wrist APB 7.2 ?4.4 0.6 ?4.0 100 Wrist - APB 7   1.6     Upper arm APB 12.7  0.9  160 Upper arm - Wrist 27 49 ?49 5.4  L Median - APB     Wrist APB 6.9 ?4.4 4.6 ?4.0 100 Wrist - APB 7   21.2     Upper arm APB 11.8  3.2  70 Upper arm - Wrist 25 50 ?49 13.9  R Ulnar - ADM     Wrist ADM NR ?3.3 NR ?6.0 NR Wrist - ADM 7   NR     B.Elbow ADM NR  NR  NR B.Elbow - Wrist 22 NR ?49 NR     A.Elbow ADM NR  NR  NR A.Elbow - B.Elbow 10 NR ?49 NR  L Ulnar - ADM     Wrist ADM 3.2 ?3.3 7.0 ?6.0 100 Wrist - ADM 7   29.5     B.Elbow ADM 7.5  5.7  81.1 B.Elbow - Wrist 22 51 ?49 27.0     A.Elbow ADM 9.6  5.7  101 A.Elbow - B.Elbow 10 49 ?49 28.5  R Tibial - AH     Ankle AH 4.8 ?5.8 0.3 ?4.0 100 Ankle - AH 9   1.4     Pop fossa AH 19.6  0.2  72.7 Pop fossa - Ankle 45 30 ?41 1.1               SNC    Nerve / Sites Rec. Site Peak Lat Ref.  Amp Ref. Segments Distance    ms ms V V  cm  R Radial - Anatomical snuff box (Forearm)     Forearm Wrist 2.8 ?2.9 11 ?15 Forearm - Wrist 10  L Radial - Anatomical snuff box (Forearm)     Forearm Wrist 2.7 ?2.9 13 ?15 Forearm - Wrist 10  R Sural - Ankle (Calf)     Calf Ankle NR ?4.4 NR ?6 Calf - Ankle 14  R Median - Orthodromic (Dig II, Mid palm)     Dig II Wrist NR ?3.4 NR  ?10 Dig II - Wrist 13  L Median - Orthodromic (Dig II, Mid palm)     Dig II Wrist NR ?3.4 NR ?10 Dig II - Wrist 13  R Ulnar - Orthodromic, (Dig V, Mid palm)     Dig V Wrist NR ?3.1 NR ?5 Dig V - Wrist 11  L Ulnar - Orthodromic, (Dig V, Mid palm)     Dig V Wrist NR ?3.1 NR ?5 Dig V - Wrist 76                   F  Wave    Nerve F Lat Ref.   ms ms  L Ulnar - ADM 42.7 ?32.0  R Tibial - AH NR ?56.0         EMG Summary Table    Spontaneous MUAP Recruitment  Muscle IA Fib PSW Fasc Other Amp Dur. Poly Pattern  R. First  dorsal interosseous Increased 3+ 3+ None _______ Increased Increased 1+ Discrete  R. Abductor pollicis brevis Increased 1+ None None _______ Increased Increased 1+ Reduced  R. Pronator teres Increased 1+ None None _______ Increased Increased 1+ Reduced  R. Brachioradialis Normal None None None _______ Normal Normal Normal Normal  R. Extensor digitorum communis Increased None None None _______ Increased Increased 1+ Reduced  R. Triceps brachii Normal None None None _______ Normal Normal Normal Normal  R. Deltoid Normal None None None _______ Normal Normal Normal Normal  R. Biceps brachii Normal None None None _______ Normal Normal Normal Normal  L. First dorsal interosseous Increased None None None _______ Increased Increased 1+ Reduced  L. Pronator teres Normal None None None _______ Normal Normal Normal Normal  L. Extensor digitorum communis Normal None None None _______ Normal Normal Normal Reduced  L. Flexor carpi ulnaris Increased None None None _______ Normal Normal Normal Reduced  L. Deltoid Normal None None None _______ Normal Normal Normal Normal  L. Biceps brachii Normal None None None _______ Normal Normal Normal Normal  L. Cervical paraspinals Normal None None None _______ Normal Normal Normal Normal  R. Tibialis posterior Increased None None None _______ Increased Increased 1+ Reduced  R. Tibialis anterior Normal None None None _______ Normal Normal Normal Normal   R. Peroneus longus Increased 1+ None None _______ Increased Increased 1+ Reduced  R. Gastrocnemius (Medial head) Normal None None None _______ Normal Normal Normal Normal  R. Vastus lateralis Normal None None None _______ Normal Normal Normal Normal  R. Abductor hallucis Increased 1+ None None _______ Normal Normal Normal Reduced  L. Abductor hallucis Increased 1+ None None _______ Increased Increased 1+ Reduced  L. Tibialis posterior Increased 1+ None None _______ Increased Increased 1+ Reduced  L. Tibialis anterior Increased 1+ None None _______ Increased Increased 1+ Reduced  L. Gastrocnemius (Lateral head) Increased None None None _______ Increased Increased 1+ Reduced  L. Peroneus longus Increased None None None _______ Increased Increased 1+ Reduced  L. Vastus lateralis Increased None None None _______ Normal Normal Normal Reduced  R. Lumbar paraspinals (mid) Normal None None None _______ Normal Normal Normal Normal  R. Lumbar paraspinals (low) Normal None None None _______ Normal Normal Normal Normal  L. Lumbar paraspinals (mid) Normal None None None _______ Normal Normal Normal Normal  L. Lumbar paraspinals (low) Normal None None None _______ Normal Normal Normal Normal  R. Flexor carpi ulnaris Increased None None None _______ Increased Increased 1+ Reduced

## 2020-05-15 ENCOUNTER — Other Ambulatory Visit: Payer: Self-pay

## 2020-05-15 ENCOUNTER — Encounter: Payer: Self-pay | Admitting: Cardiovascular Disease

## 2020-05-15 ENCOUNTER — Ambulatory Visit (INDEPENDENT_AMBULATORY_CARE_PROVIDER_SITE_OTHER): Payer: Medicaid Other | Admitting: Cardiovascular Disease

## 2020-05-15 VITALS — BP 100/70 | HR 73 | Ht 74.0 in | Wt 233.4 lb

## 2020-05-15 DIAGNOSIS — I5022 Chronic systolic (congestive) heart failure: Secondary | ICD-10-CM | POA: Diagnosis not present

## 2020-05-15 DIAGNOSIS — E785 Hyperlipidemia, unspecified: Secondary | ICD-10-CM | POA: Diagnosis not present

## 2020-05-15 DIAGNOSIS — I639 Cerebral infarction, unspecified: Secondary | ICD-10-CM | POA: Diagnosis not present

## 2020-05-15 DIAGNOSIS — Z720 Tobacco use: Secondary | ICD-10-CM

## 2020-05-15 DIAGNOSIS — I2510 Atherosclerotic heart disease of native coronary artery without angina pectoris: Secondary | ICD-10-CM | POA: Diagnosis not present

## 2020-05-15 DIAGNOSIS — U071 COVID-19: Secondary | ICD-10-CM | POA: Diagnosis not present

## 2020-05-15 DIAGNOSIS — I10 Essential (primary) hypertension: Secondary | ICD-10-CM

## 2020-05-15 MED ORDER — ENTRESTO 24-26 MG PO TABS: 1.0000 | ORAL_TABLET | Freq: Two times a day (BID) | ORAL | 1 refills | Status: DC

## 2020-05-15 MED ORDER — FUROSEMIDE 20 MG PO TABS: 20.0000 mg | ORAL_TABLET | Freq: Every day | ORAL | 3 refills | Status: DC | PRN

## 2020-05-15 MED ORDER — CARVEDILOL 25 MG PO TABS: 25.0000 mg | ORAL_TABLET | Freq: Two times a day (BID) | ORAL | 3 refills | Status: DC

## 2020-05-15 MED ORDER — SPIRONOLACTONE 25 MG PO TABS: 12.5000 mg | ORAL_TABLET | ORAL | 1 refills | Status: DC

## 2020-05-15 MED ORDER — ROSUVASTATIN CALCIUM 20 MG PO TABS: 20.0000 mg | ORAL_TABLET | Freq: Every day | ORAL | 0 refills | Status: DC

## 2020-05-15 MED ORDER — EMPAGLIFLOZIN 10 MG PO TABS: 10.0000 mg | ORAL_TABLET | Freq: Every day | ORAL | 3 refills | Status: DC

## 2020-05-15 MED ORDER — CLOPIDOGREL BISULFATE 75 MG PO TABS: 75.0000 mg | ORAL_TABLET | Freq: Every day | ORAL | 3 refills | Status: AC

## 2020-05-15 NOTE — Patient Instructions (Signed)
Medication Instructions:  The current medical regimen is effective;  continue present plan and medications.  *If you need a refill on your cardiac medications before your next appointment, please call your pharmacy*   Lab Work: BMET, LIPID today   If you have labs (blood work) drawn today and your tests are completely normal, you will receive your results only by: Marland Kitchen MyChart Message (if you have MyChart) OR . A paper copy in the mail If you have any lab test that is abnormal or we need to change your treatment, we will call you to review the results.   Testing/Procedures: Echocardiogram (in 3 months before follow up) - Your physician has requested that you have an echocardiogram. Echocardiography is a painless test that uses sound waves to create images of your heart. It provides your doctor with information about the size and shape of your heart and how well your heart's chambers and valves are working. This procedure takes approximately one hour. There are no restrictions for this procedure. This will be performed at our [facility] location - [address].    Follow-Up: At [facility], you and your health needs are our priority.  As part of our continuing mission to provide you with exceptional heart care, we have created designated Provider Care Teams.  These Care Teams include your primary Cardiologist (physician) and Advanced Practice Providers (APPs -  Physician Assistants and Nurse Practitioners) who all work together to provide you with the care you need, when you need it.  We recommend signing up for the patient portal called "MyChart".  Sign up information is provided on this After Visit Summary.  MyChart is used to connect with patients for Virtual Visits (Telemedicine).  Patients are able to view lab/test results, encounter notes, upcoming appointments, etc.  Non-urgent messages can be sent to your provider as well.   To learn more about what you can do with  MyChart, go to [email or web address].    Your next appointment:   3 month(s)  The format for your next appointment:   In Person  Provider:   Lennie Odor, MD

## 2020-05-16 LAB — LIPID PANEL
Chol/HDL Ratio: 2.4 ratio (ref 0.0–5.0)
Cholesterol, Total: 120 mg/dL (ref 100–199)
HDL: 50 mg/dL (ref >39)
LDL Chol Calc (NIH): 47 mg/dL (ref 0–99)
Triglycerides: 131 mg/dL (ref 0–149)
VLDL Cholesterol Cal: 23 mg/dL (ref 5–40)

## 2020-05-16 LAB — BASIC METABOLIC PANEL
BUN/Creatinine Ratio: 11 (ref 9–20)
BUN: 10 mg/dL (ref 6–24)
CO2: 23 mmol/L (ref 20–29)
Calcium: 9.8 mg/dL (ref 8.7–10.2)
Chloride: 99 mmol/L (ref 96–106)
Creatinine, Ser: 0.89 mg/dL (ref 0.76–1.27)
Glucose: 153 mg/dL — ABNORMAL HIGH (ref 65–99)
Potassium: 5 mmol/L (ref 3.5–5.2)
Sodium: 138 mmol/L (ref 134–144)
eGFR: 104 mL/min/{1.73_m2} (ref >59)

## 2020-05-17 ENCOUNTER — Other Ambulatory Visit: Payer: Self-pay | Admitting: Family Medicine

## 2020-05-17 DIAGNOSIS — M25511 Pain in right shoulder: Secondary | ICD-10-CM

## 2020-05-17 NOTE — Progress Notes (Signed)
Kindly inform the patient that EMG nerve conduction study suggests evidence of peripheral neuropathy likely related to uncontrolled diabetes he needs to discuss with primary care physician tighter control of sugars

## 2020-05-20 ENCOUNTER — Telehealth: Payer: Self-pay | Admitting: Cardiovascular Disease

## 2020-05-20 DIAGNOSIS — E1140 Type 2 diabetes mellitus with diabetic neuropathy, unspecified: Secondary | ICD-10-CM | POA: Diagnosis not present

## 2020-05-20 DIAGNOSIS — E11610 Type 2 diabetes mellitus with diabetic neuropathic arthropathy: Secondary | ICD-10-CM | POA: Diagnosis not present

## 2020-05-20 NOTE — Telephone Encounter (Signed)
**Note De-Identified Micheal Bell Obfuscation** I completed a urgent Entresto PA through [email or web address] Awaiting determination.

## 2020-05-20 NOTE — Telephone Encounter (Signed)
Pt c/o medication issue:  1. Name of Medication:  sacubitril-valsartan (ENTRESTO) 24-26 MG  2. How are you currently taking this medication (dosage and times per day)? Patient has been taking 1 tablet once daily  3. Are you having a reaction (difficulty breathing--STAT)? No   4. What is your medication issue?   Patient states his pharmacy is requesting a prior authorization from Dr. Flora Lipps prior to distributing the medication.  [facility] - [place], [place] - [address]

## 2020-05-21 ENCOUNTER — Telehealth: Payer: Self-pay | Admitting: *Deleted

## 2020-05-21 NOTE — Telephone Encounter (Signed)
Called and spoke with pt. Relayed results per Dr. Marlis Edelson note. Pt is now following with Dr Misty Stanley Caldwell/endocrinologist to better manage diabetes. He will keep next follow up for 08/05/20 and will call with any new or worsening sx.

## 2020-05-21 NOTE — Telephone Encounter (Signed)
-----   Message from Arther Abbott, RN sent at 05/20/2020  5:16 PM EDT -----  ----- Message ----- From: Micki Riley, MD Sent: 05/17/2020   3:14 PM EDT To: Lenise Arena, RN  Kindly inform the patient that EMG nerve conduction study suggests evidence of peripheral neuropathy likely related to uncontrolled diabetes he needs to discuss with primary care physician tighter control of sugars

## 2020-05-23 NOTE — Telephone Encounter (Signed)
Received notification that Sherryll Burger was approved on 05/14/2020.

## 2020-05-24 DIAGNOSIS — Z419 Encounter for procedure for purposes other than remedying health state, unspecified: Secondary | ICD-10-CM | POA: Diagnosis not present

## 2020-05-29 ENCOUNTER — Other Ambulatory Visit: Payer: Self-pay | Admitting: Neurology

## 2020-05-29 ENCOUNTER — Telehealth: Payer: Self-pay | Admitting: Neurology

## 2020-05-29 NOTE — Telephone Encounter (Signed)
I called the patient's sister and gave her results of the MRI scan of the brain done at [facility] showing no acute abnormality and old lacunar stroke in the left deep brain with and mild changes of microhemorrhages likely related to longstanding high blood pressure and diabetes.  Nothing to worry about.  She voiced understanding

## 2020-06-12 ENCOUNTER — Other Ambulatory Visit: Payer: Self-pay

## 2020-06-12 ENCOUNTER — Ambulatory Visit
Admission: RE | Admit: 2020-06-12 | Discharge: 2020-06-12 | Disposition: A | Discharge status: Home / Self Care | Payer: Medicaid Other | Source: Ambulatory Visit | Attending: Family Medicine | Admitting: Family Medicine

## 2020-06-12 DIAGNOSIS — M25511 Pain in right shoulder: Secondary | ICD-10-CM | POA: Diagnosis not present

## 2020-06-12 IMAGING — MR MR SHOULDER*R* W/O CM
4 of 5 series · 17 of 40 positions shown · non-contrast
Comparison: Right shoulder x-rays dated [DATE].

CLINICAL DATA: Right shoulder pain and limited range of motion
since fall last [REDACTED]. No prior surgery.

EXAM:
MRI OF THE RIGHT SHOULDER WITHOUT CONTRAST
TECHNIQUE: Multiplanar, multisequence MR imaging of the shoulder was performed.
No intravenous contrast was administered.

[Series 7: T2 fat-sat · oblique · right · 4.0mm · 0.22mm/px · 4 of 24 slices shown (1 of 3)]
[im 1/24]
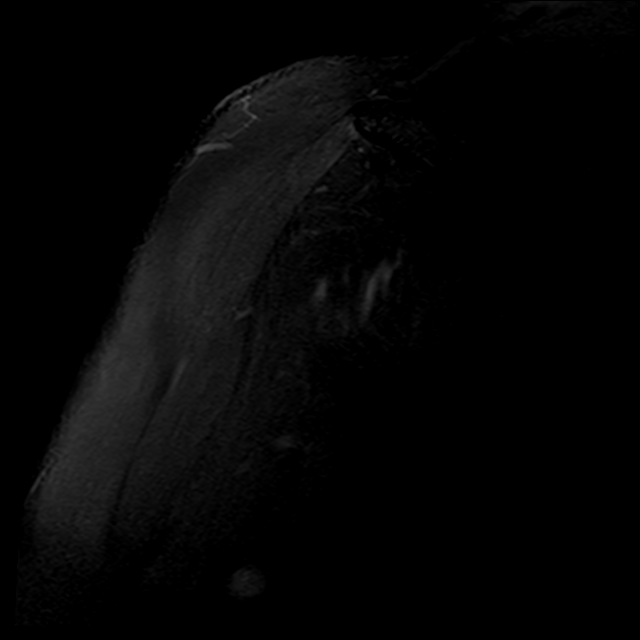
[im 4/24]
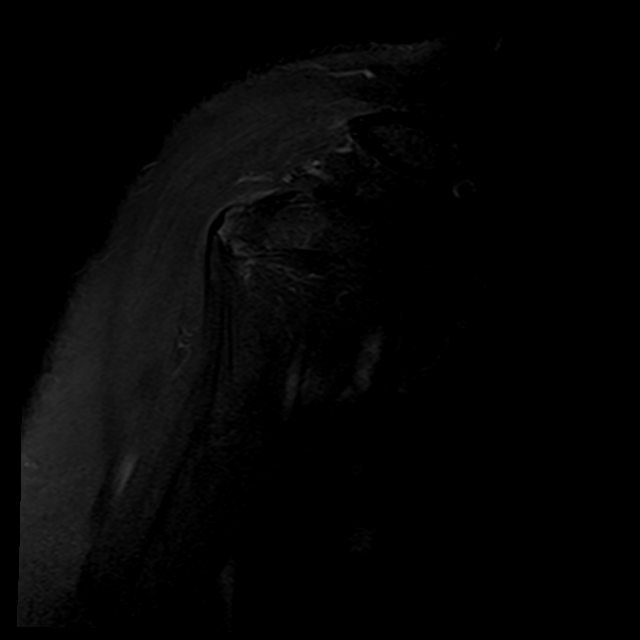
[im 14/24]
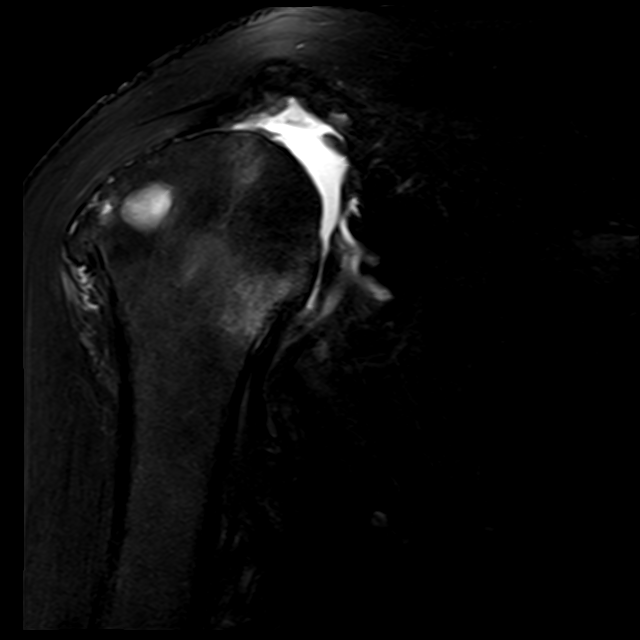
[im 20/24]
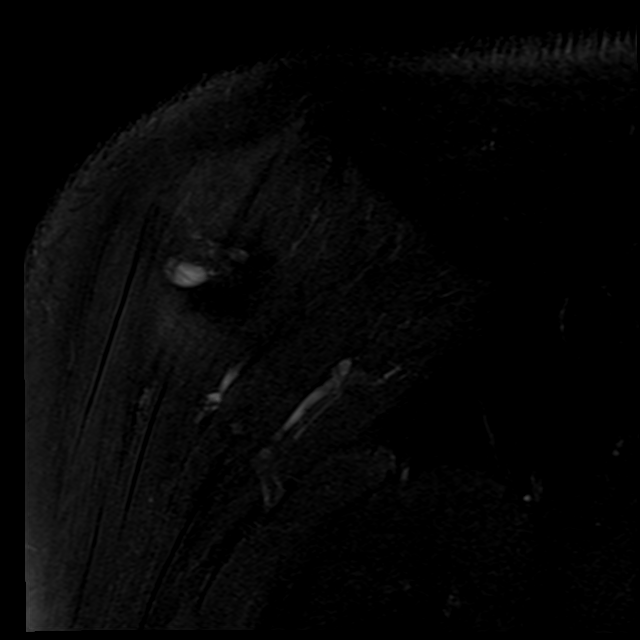

[Series 8: T2 fat-sat · oblique · right · 4.0mm · 0.44mm/px · 3 of 26 slices shown (2 of 3)]
[im 4/26]
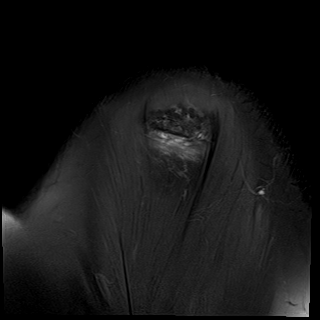
[im 15/26]
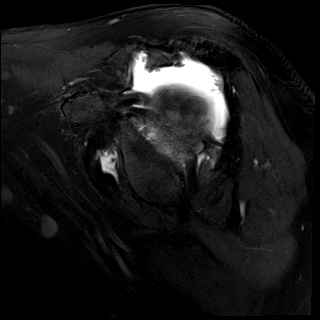
[im 22/26]
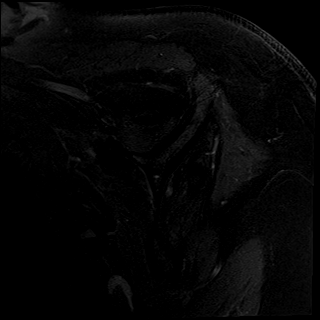

[Series 9: PD · oblique · right · 4.0mm · 0.22mm/px · 7 of 21 slices shown]
[im 1/21]
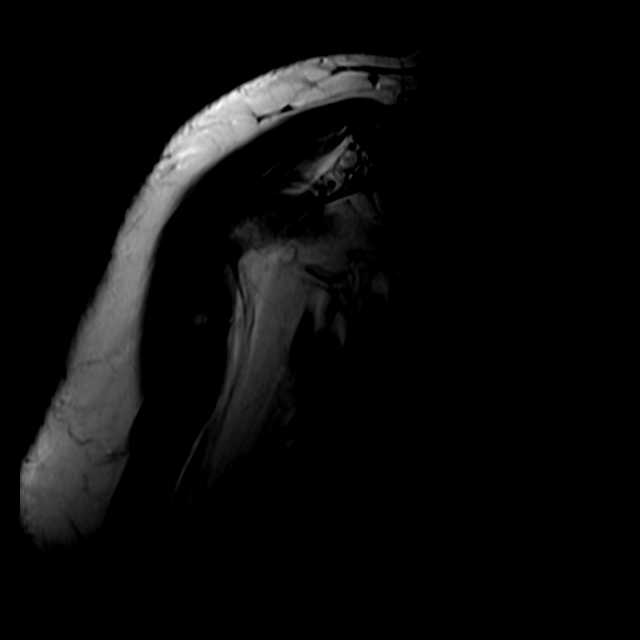
[im 4/21]
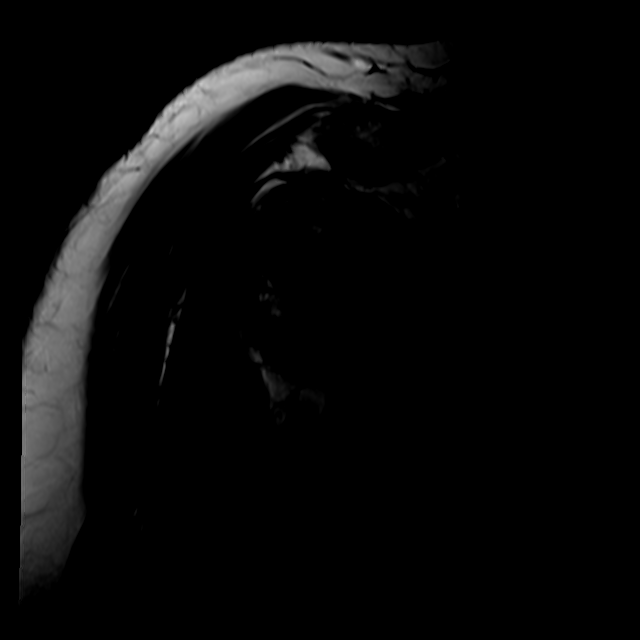
[im 7/21]
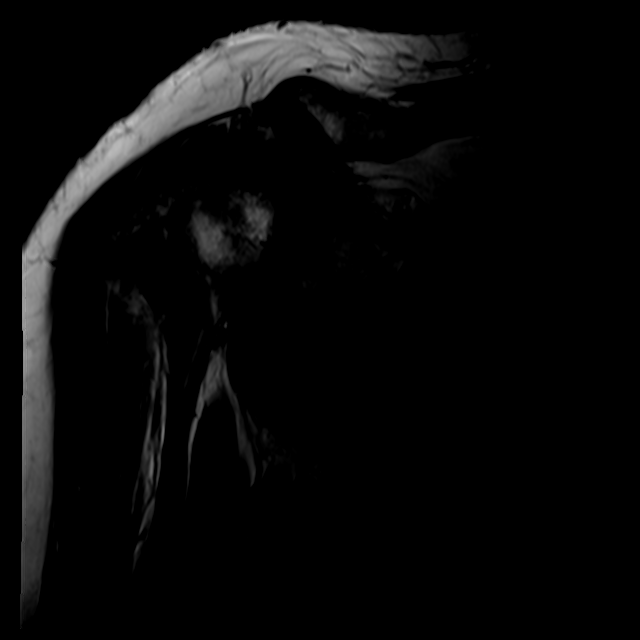
[im 11/21]
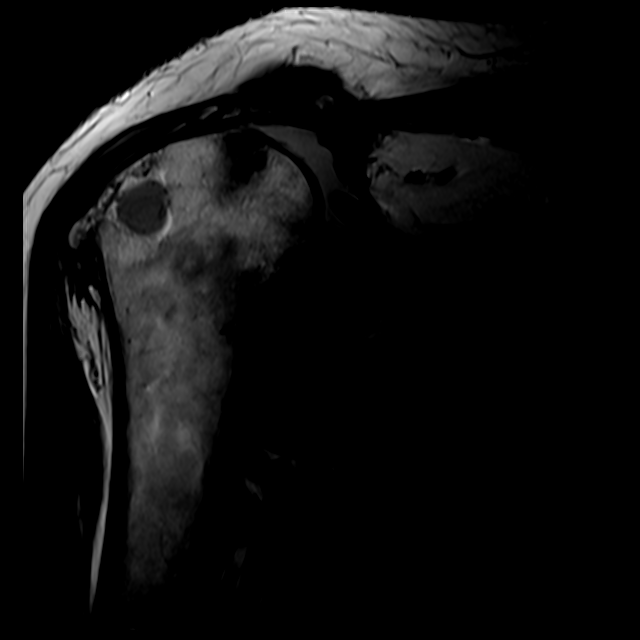
[im 14/21]
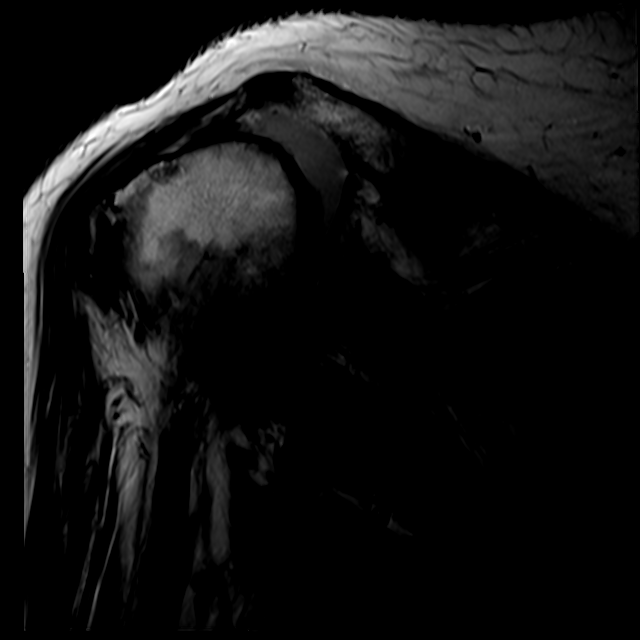
[im 17/21]
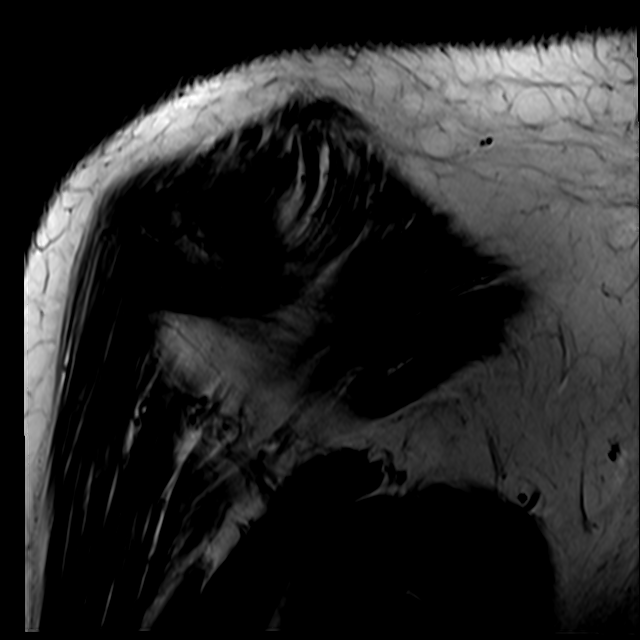
[im 21/21]
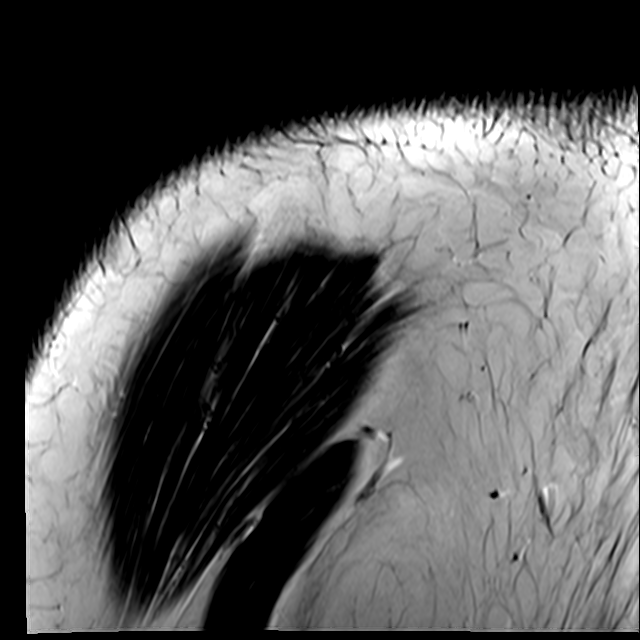

[Series 11: T2 fat-sat · axial · right · 3.0mm · 0.47mm/px · z∈[-25,+51]mm · 3 of 29 slices shown (3 of 3)]
[im 4/29]
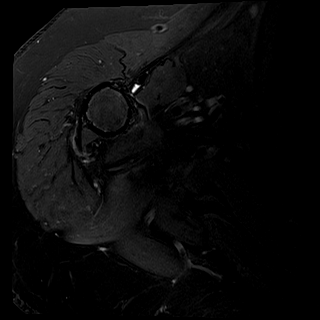
[im 15/29]
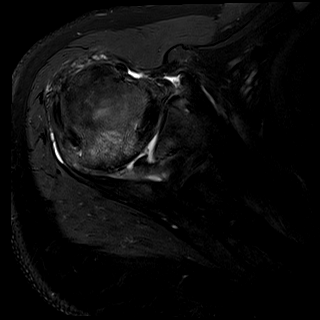
[im 25/29]
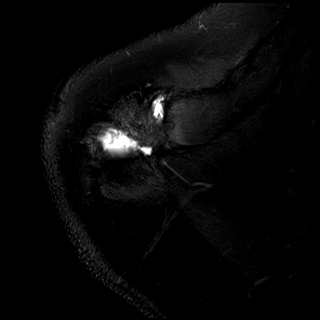

[17 of 40 positions shown; findings below may reference images not displayed]

FINDINGS: Rotator cuff: Full-thickness, full width tears of the supraspinatus,
infraspinatus, and subscapularis tendons with retraction to the
glenoid. The teres minor tendon is intact.

Muscles:  Mild to moderate rotator cuff muscle atrophy.

Biceps long head:  Intact but medially dislocated.

Acromioclavicular Joint: Moderate arthropathy of the
acromioclavicular joint. Chronic nonunited fracture of the acromion
with of the 5 mm distraction. Fluid within the
subacromial/subdeltoid bursa.

Glenohumeral Joint: Small joint effusion. Extensive areas of partial
and full-thickness cartilage loss over the humeral head.
Full-thickness cartilage loss over the posterosuperior glenoid.

Labrum: Diffusely degenerated. Large posterior labral tear (series
11, images 14-17; series 8, image 11).

Bones: Posteriorly and superiorly subluxed high-riding humeral head.
Large reactive subcortical cyst in the greater tuberosity. No acute
fracture. No suspicious bone lesion.

Other: None.
IMPRESSION: 1. Massive full-thickness, full width rotator cuff tear involving
the supraspinatus, infraspinatus, and subscapularis tendons with
retraction to the glenoid. Mild to moderate muscle atrophy.
2. Medial dislocation of the biceps tendon.
3. Chronic nonunited fracture of the acromion.
4. Moderate acromioclavicular and severe glenohumeral
osteoarthritis.

## 2020-06-14 DIAGNOSIS — M75121 Complete rotator cuff tear or rupture of right shoulder, not specified as traumatic: Secondary | ICD-10-CM | POA: Diagnosis not present

## 2020-06-14 DIAGNOSIS — M19111 Post-traumatic osteoarthritis, right shoulder: Secondary | ICD-10-CM | POA: Diagnosis not present

## 2020-06-20 DIAGNOSIS — M797 Fibromyalgia: Secondary | ICD-10-CM | POA: Diagnosis not present

## 2020-06-20 DIAGNOSIS — R0683 Snoring: Secondary | ICD-10-CM | POA: Diagnosis not present

## 2020-06-20 DIAGNOSIS — F5104 Psychophysiologic insomnia: Secondary | ICD-10-CM | POA: Diagnosis not present

## 2020-06-20 DIAGNOSIS — G4733 Obstructive sleep apnea (adult) (pediatric): Secondary | ICD-10-CM | POA: Diagnosis not present

## 2020-06-20 DIAGNOSIS — E119 Type 2 diabetes mellitus without complications: Secondary | ICD-10-CM | POA: Diagnosis not present

## 2020-06-20 DIAGNOSIS — M109 Gout, unspecified: Secondary | ICD-10-CM | POA: Diagnosis not present

## 2020-06-23 DIAGNOSIS — Z419 Encounter for procedure for purposes other than remedying health state, unspecified: Secondary | ICD-10-CM | POA: Diagnosis not present

## 2020-06-24 ENCOUNTER — Other Ambulatory Visit: Payer: Self-pay | Admitting: Cardiovascular Disease

## 2020-07-17 DIAGNOSIS — E119 Type 2 diabetes mellitus without complications: Secondary | ICD-10-CM | POA: Diagnosis not present

## 2020-07-17 DIAGNOSIS — E1159 Type 2 diabetes mellitus with other circulatory complications: Secondary | ICD-10-CM | POA: Diagnosis not present

## 2020-07-17 DIAGNOSIS — E11610 Type 2 diabetes mellitus with diabetic neuropathic arthropathy: Secondary | ICD-10-CM | POA: Diagnosis not present

## 2020-07-21 DIAGNOSIS — H5213 Myopia, bilateral: Secondary | ICD-10-CM | POA: Diagnosis not present

## 2020-07-24 DIAGNOSIS — Z419 Encounter for procedure for purposes other than remedying health state, unspecified: Secondary | ICD-10-CM | POA: Diagnosis not present

## 2020-08-01 ENCOUNTER — Ambulatory Visit (INDEPENDENT_AMBULATORY_CARE_PROVIDER_SITE_OTHER): Payer: Medicaid Other | Admitting: Podiatry

## 2020-08-01 ENCOUNTER — Encounter: Payer: Self-pay | Admitting: Podiatry

## 2020-08-01 ENCOUNTER — Other Ambulatory Visit: Payer: Self-pay

## 2020-08-01 DIAGNOSIS — B351 Tinea unguium: Secondary | ICD-10-CM

## 2020-08-01 DIAGNOSIS — M79674 Pain in right toe(s): Secondary | ICD-10-CM | POA: Diagnosis not present

## 2020-08-01 DIAGNOSIS — Q66229 Congenital metatarsus adductus, unspecified foot: Secondary | ICD-10-CM

## 2020-08-01 DIAGNOSIS — M79675 Pain in left toe(s): Secondary | ICD-10-CM | POA: Diagnosis not present

## 2020-08-01 DIAGNOSIS — E11610 Type 2 diabetes mellitus with diabetic neuropathic arthropathy: Secondary | ICD-10-CM

## 2020-08-01 DIAGNOSIS — E1142 Type 2 diabetes mellitus with diabetic polyneuropathy: Secondary | ICD-10-CM

## 2020-08-01 MED ORDER — LIDOCAINE 5 % EX OINT: 1.0000 "application " | TOPICAL_OINTMENT | CUTANEOUS | 0 refills | Status: DC | PRN

## 2020-08-04 NOTE — Progress Notes (Signed)
  Subjective:  Patient ID: Micheal Bell, male    DOB: February 07, 1971,  MRN: [id number]  Chief Complaint  Patient presents with   Nail Problem    3 month routine foot care    50 y.o. male returns with the above complaint. History confirmed with patient.  He is doing well no new issues he is wearing his new diabetic shoes he complains today of elongated thickened yellowed toenails that are difficult to cut  Objective:  Physical Exam: warm, good capillary refill, normal DP and PT pulses, reduced sensation grossly throughout the left lower extremity, onychomycosis x10 with yellow and elongated thickened nail plates Left Foot: C-shaped foot type with prominent fifth metatarsal base, ulceration is healed     Assessment:   1. Pain due to onychomycosis of toenails of both feet   2. Type 2 diabetes mellitus with diabetic polyneuropathy, without long-term current use of insulin (HCC)   3. Metatarsus adductus   4. Charcot's arthropathy, diabetic (HCC)       Plan:  Patient was evaluated and treated and all questions answered.  Patient educated on diabetes. Discussed proper diabetic foot care and discussed risks and complications of disease. Educated patient in depth on reasons to return to the office immediately should he/she discover anything concerning or new on the feet. All questions answered. Discussed proper shoes as well.   Continue wearing diabetic shoes  Discussed the etiology and treatment options for the condition in detail with the patient. Educated patient on the topical and oral treatment options for mycotic nails. Recommended debridement of the nails today. Sharp and mechanical debridement performed of all painful and mycotic nails today. Nails debrided in length and thickness using a nail nipper to level of comfort. Discussed treatment options including appropriate shoe gear. Follow up as needed for painful nails.     No follow-ups on file.    Sharl Ma,  DPM 08/04/2020

## 2020-08-05 ENCOUNTER — Ambulatory Visit: Payer: Medicaid Other | Admitting: Neurology

## 2020-08-05 ENCOUNTER — Encounter: Payer: Self-pay | Admitting: Neurology

## 2020-08-05 ENCOUNTER — Other Ambulatory Visit: Payer: Self-pay

## 2020-08-05 VITALS — BP 110/75 | HR 86 | Ht 72.0 in | Wt 245.0 lb

## 2020-08-05 DIAGNOSIS — G5621 Lesion of ulnar nerve, right upper limb: Secondary | ICD-10-CM

## 2020-08-05 DIAGNOSIS — Z8673 Personal history of transient ischemic attack (TIA), and cerebral infarction without residual deficits: Secondary | ICD-10-CM | POA: Diagnosis not present

## 2020-08-05 MED ORDER — LEVETIRACETAM 250 MG PO TABS: 250.0000 mg | ORAL_TABLET | Freq: Two times a day (BID) | ORAL | 0 refills | Status: DC

## 2020-08-05 NOTE — Patient Instructions (Signed)
I had a long discussion with the patient and his uncle regarding his right hand weakness and wasting which is likely due to ulnar neuropathy etiology is unclear but probably acute during the hospitalization he also has baseline diabetic neuropathy and some weakness from his recent left subcortical infarct as well.  I recommend he stay on aspirin and Plavix for stroke prevention given history of multiple cardiac stents as well as maintain aggressive risk factor modification strict control of hypertension with blood pressure goal below 130/90, lipids with LDL cholesterol goal below 70 mg percent and diabetes with hemoglobin A1c goal below 6.5%.  He was encouraged to use his wheeled walker at all times and we discussed fall safety precautions.  Patient may also begin tapering Keppra to 250 mg twice daily for a month and stop since it was given mostly as a prophylactic agent to prevent seizures.  Also check EEG.  He can increase his gabapentin night time dose to 2 tablets to help with his neuropathy pain.  He will return for follow-up in the future in 6 months with my nurse practitioner Janett Billow or call earlier if necessary. Understanding Your Risk for Falls Each year, millions of people have serious injuries from falls. It is important to understand your risk for falling. Talk with your health care provider about your risk and what you can do to lower it. There are actions you can take athome to lower your risk and prevent falls. If you do have a serious fall, make sure to tell your health care provider.Falling once raises your risk of falling again. How can falls affect me? Serious injuries from falls are common. These include: Broken bones, such as hip fractures. Head injuries, such as traumatic brain injuries (TBI) or concussion. A fear of falling can cause you to avoid activities and stay at home. This canmake your muscles weaker and actually raise your risk for a fall. What can increase my risk? There are  a number of risk factors that increase your risk for falling. The more risk factors you have, the higher your risk of falling. Serious injuries from a fall happen most often to people older than age 52. Children and youngadults ages 33-29 are also at higher risk. Common risk factors include: Weakness in the lower body. Lack (deficiency) of vitamin D. Being generally weak or confused due to long-term (chronic) illness. Dizziness or balance problems. Poor vision. Medicines that cause dizziness or drowsiness. These can include medicines for your blood pressure, heart, anxiety, insomnia, or edema, as well as pain medicines and muscle relaxants. Other risk factors include: Drinking alcohol. Having had a fall in the past. Having depression. Having foot pain or wearing improper footwear. Working at a dangerous job. Having any of the following in your home: Tripping hazards, such as floor clutter or loose rugs. Poor lighting. Pets. Having dementia or memory loss. What actions can I take to lower my risk of falling?     Physical activity Maintain physical fitness. Do strength and balance exercises. Consider taking aregular class to build strength and balance. Yoga and tai chi are good options. Vision Have your eyes checked every year and your vision prescription updated asneeded. Walking aids and footwear Wear nonskid shoes. Do not wear high heels. Do not walk around the house in socks or slippers. Use a cane or walker as told by your health care provider. Home safety Attach secure railings on both sides of your stairs. Install grab bars for your tub, shower, and toilet.  Use a bath mat in your tub or shower. Use good lighting in all rooms. Keep a flashlight near your bed. Make sure there is a clear path from your bed to the bathroom. Use night-lights. Do not use throw rugs. Make sure all carpeting is taped or tacked down securely. Remove all clutter from walkways and stairways, including  extension cords. Repair uneven or broken steps. Avoid walking on icy or slippery surfaces. Walk on the grass instead of on icy or slick sidewalks. Use ice melt to get rid of ice on walkways. Use a cordless phone. Questions to ask your health care provider Can you help me check my risk for a fall? Do any of my medicines make me more likely to fall? Should I take a vitamin D supplement? What exercises can I do to improve my strength and balance? Should I make an appointment to have my vision checked? Do I need a bone density test to check for weak bones or osteoporosis? Would it help to use a cane or a walker? Where to find more information Centers for Disease Control and Prevention, [employer]: http://www.wolf.info/ Community-Based Fall Prevention Programs: http://www.wolf.info/ National Institute on Aging: http://kim-miller.com/ Contact a health care provider if: You fall at home. You are afraid of falling at home. You feel weak, drowsy, or dizzy. Summary Serious injuries from a fall happen most often to people older than age 26. Children and young adults ages 11-29 are also at higher risk. Talk with your health care provider about your risks for falling and how to lower those risks. Taking certain precautions at home can lower your risk for falling. If you fall, always tell your health care provider. This information is not intended to replace advice given to you by your health care provider. Make sure you discuss any questions you have with your healthcare provider. Document Revised: 09/13/2019 Document Reviewed: 09/13/2019 Elsevier Patient Education  [place].

## 2020-08-05 NOTE — Progress Notes (Signed)
[facility] [address] [place]. [place] [address] [phone]       OFFICE FOLLOW UP VISITT NOTE  Micheal Bell Date of Birth:  1970-04-04 Medical Record Number:  [id number]   Referring MD:  Benedetto Goad  Reason for Referral: Stroke  HPI: Initial visit 05/01/2020 :Micheal Bell is a 50 year old Caucasian male with history of diabetes, hypertension, gout, irritable bowel syndrome, fibromyalgia, obesity who is seen today for office consultation visit for his stroke and hand weakness.  History is obtained from the patient and his uncle who is accompanying him as well as review of electronic medical records and I have personally reviewed pertinent imaging films in PACS. Patient initially was admitted on 10/08/2020 for Covid pneumonia to the floor and decompensated on 10/11/2020 requiring intubation.  He self extubated himself on 10/15/2020 but required reintubation.  He subsequently was extubated on 10/20/2020 and a typical therapy for Covid including steroids, twice daily CT neb and remdesivir.  Hospitalization was complicated by MSSA pneumonia and group B Streptococcus bacteremia due to left foot abscess.  MRI of the foot showed left foot abscess.  And systolic heart failure.  TEE was negative for vegetations.  He developed some right-sided weakness and confusion and MRI scan of the brain was obtained on 10/25/2019 which showed a 17 mm focus of restricted diffusion in the left frontoparietal subcortical region which showed ring like enhancement on postcontrast raising suspicion for subacute infarct versus cerebritis.  MR angiogram of the brain on 10/29/2019 showed no large vessel stenosis or occlusion.  EEG showed mild diffuse slowing without definite epileptiform activity.  He was started on Keppra for seizure prophylaxis which she seems to be tolerating well without side effects.  Patient was not on aspirin at the time.  Patient states he initially did well and recovered his right upper  extremity strength after discharge.  However he was admitted with chest pain on 02/20/2020 and was found to have multivessel coronary artery disease and underwent emergent 3 coronary stents.  Following which she had severe pain in the right elbow with swelling and subsequently right hand weakness.  He is now developed clawing of the right ulnar fingers with inability to extend them with weakness and numbness that.  He did finish home physical and occupational therapy.  Is able to ambulate independently but still has persistent right hand weakness and slight dragging of the right leg.  He has not undergone any outpatient therapies.  He states he has no further seizures. Update 08/05/2020 : He returns for follow-up after last visit 3 months ago.  He continues to have significant weakness and wasting of his right hand as well as gait and balance difficulties.  He had EMG nerve conduction study done on 04/24/2020 by [name] confirmed underlying diabetic neuropathy as well as mononeuropathy of the right ulnar nerve which was severe in the right upper extremity but difficult to localize exactly.  There was evidence of mild median neuropathy bilaterally as well.  MRI scan of the brain done at [facility] on 05/15/2020 showed a small chronic left centrum semiovale infarct with small focus of microhemorrhage and mild changes of small vessel disease.  Patient is tolerating aspirin Plavix well without bruising or bleeding.  He states blood pressure is under good control today it is 110/75.  His blood sugars are also under good control.  He still has paresthesias in his feet particularly at night despite taking gabapentin 300 mg 3 times daily.  He has  no new complaints.  Still on Keppra which was started for seizure prophylaxis in the hospital but has not had any seizures. ROS:   14 system review of systems is positive for  Weakness, numbness, gait difficulty, decreased sensation all other systems negative PMH:  Past  Medical History:  Diagnosis Date   Arthritis    Asthma    Coronary artery disease    Diabetes mellitus without complication (HCC)    DVT (deep venous thrombosis) (HCC)    Fibromyalgia    Gout    Hypertension    IBS (irritable bowel syndrome)    Stroke (HCC)    Vein disorder     Social History:  Social History   Socioeconomic History   Marital status: Single    Spouse name: Not on file   Number of children: Not on file   Years of education: Not on file   Highest education level: Not on file  Occupational History   Occupation: disabled  Tobacco Use   Smoking status: Never   Smokeless tobacco: Current    Types: Snuff  Vaping Use   Vaping Use: Never used  Substance and Sexual Activity   Alcohol use: Never   Drug use: Never   Sexual activity: Not on file  Other Topics Concern   Not on file  Social History Narrative   Lives alone, mom is next door   Right handed   Drinks no caffeine   Social Determinants of Health   Financial Resource Strain: Not on file  Food Insecurity: Not on file  Transportation Needs: Not on file  Physical Activity: Not on file  Stress: Not on file  Social Connections: Not on file  Intimate Partner Violence: Not on file    Medications:   Current Outpatient Medications on File Prior to Visit  Medication Sig Dispense Refill   albuterol (PROVENTIL HFA;VENTOLIN HFA) 108 (90 Base) MCG/ACT inhaler Inhale into the lungs every 6 (six) hours as needed for wheezing or shortness of breath.     albuterol (PROVENTIL) (2.5 MG/3ML) 0.083% nebulizer solution Take 2.5 mg by nebulization every 6 (six) hours as needed for wheezing or shortness of breath.     allopurinol (ZYLOPRIM) 300 MG tablet Take 300 mg by mouth daily.     aspirin EC 81 MG tablet Take 1 tablet (81 mg total) by mouth daily. Swallow whole. 30 tablet 11   buPROPion (WELLBUTRIN XL) 150 MG 24 hr tablet Take 150 mg by mouth every morning.     carvedilol (COREG) 25 MG tablet Take 1 tablet (25 mg  total) by mouth 2 (two) times daily. 180 tablet 3   clopidogrel (PLAVIX) 75 MG tablet Take 1 tablet (75 mg total) by mouth daily. 90 tablet 3   cyclobenzaprine (FLEXERIL) 10 MG tablet Take 0.5 tablets (5 mg total) by mouth 2 (two) times daily as needed for muscle spasms. (Patient taking differently: Take 10 mg by mouth 3 (three) times daily.) 15 tablet 0   empagliflozin (JARDIANCE) 10 MG TABS tablet Take 1 tablet (10 mg total) by mouth daily before breakfast. 90 tablet 3   fluticasone (FLONASE) 50 MCG/ACT nasal spray Place 1 spray into both nostrils daily.     furosemide (LASIX) 20 MG tablet Take 1 tablet (20 mg total) by mouth daily as needed for fluid or edema (weight gain of 3 lbs or more in a day or 5 lbs in a week). 30 tablet 3   gabapentin (NEURONTIN) 300 MG capsule Take 300 mg by  mouth 3 (three) times daily. Take 1 capsule in morning and afternoon and 2 capsules at night     HYDROmorphone (DILAUDID) 2 MG tablet Take 2 mg by mouth 3 (three) times daily.     insulin glargine (LANTUS) 100 UNIT/ML Solostar Pen Inject 20 Units into the skin daily. (Patient taking differently: Inject 20 Units into the skin every evening.) 15 mL 0   lidocaine (XYLOCAINE) 5 % ointment Apply 1 application topically as needed. 35.44 g 0   meloxicam (MOBIC) 15 MG tablet TAKE ONE TABLET BY MOUTH EVERY DAY WITH A MEAL FOR ARTHRITIS     mometasone-formoterol (DULERA) 200-5 MCG/ACT AERO Inhale 2 puffs into the lungs 2 (two) times daily.     montelukast (SINGULAIR) 10 MG tablet Take 10 mg by mouth at bedtime.     nortriptyline (PAMELOR) 25 MG capsule Take 25 mg by mouth at bedtime.     pantoprazole (PROTONIX) 40 MG tablet Take 40 mg by mouth daily.     PENTIPS 32G X 4 MM MISC See admin instructions. with insulin     rosuvastatin (CRESTOR) 20 MG tablet Take 1 tablet (20 mg total) by mouth daily. 90 tablet 0   sacubitril-valsartan (ENTRESTO) 24-26 MG Take 1 tablet by mouth 2 (two) times daily. 60 tablet 1   spironolactone  (ALDACTONE) 25 MG tablet Take 0.5 tablets (12.5 mg total) by mouth every other day. 60 tablet 1   triamcinolone (KENALOG) 0.1 % Apply 1 application topically 2 (two) times daily as needed (irritation).     zolpidem (AMBIEN) 10 MG tablet Take 10 mg by mouth at bedtime.     nitroGLYCERIN (NITROSTAT) 0.4 MG SL tablet Place 1 tablet (0.4 mg total) under the tongue every 5 (five) minutes as needed for chest pain. 25 tablet 11   Current Facility-Administered Medications on File Prior to Visit  Medication Dose Route Frequency Provider Last Rate Last Admin   regadenoson (LEXISCAN) injection SOLN 0.4 mg  0.4 mg Intravenous Once O'Neal, Ronnald Ramp, MD        Allergies:   Allergies  Allergen Reactions   Azithromycin     Blisters in mouth    Lodine [Etodolac] Anaphylaxis    Physical Exam General: well developed, well nourished middle-aged mildly obese Caucasian male, seated, in no evident distress Head: head normocephalic and atraumatic.   Neck: supple with no carotid or supraclavicular bruits Cardiovascular: regular rate and rhythm, no murmurs Musculoskeletal: no deformity Skin:  no rash/petichiae Vascular:  Normal pulses all extremities  Neurologic Exam Mental Status: Awake and fully alert. Oriented to place and time. Recent and remote memory intact. Attention span, concentration and fund of knowledge appropriate. Mood and affect appropriate.  Cranial Nerves: Fundoscopic exam reveals sharp disc margins. Pupils equal, briskly reactive to light. Extraocular movements full without nystagmus. Visual fields full to confrontation. Hearing intact. Facial sensation intact. Face, tongue, palate moves normally and symmetrically.  Motor: Diminished bulk in the right hand intrinsic muscles lumbricals and interossei with wasting.  Clawing of the ulnar 3 digits with nonfixed contractures.  Weakness of right grip and intrinsic hand muscles.  Normal strength in other muscles.  Slight increased tone in the  right leg with weakness of ankle dorsiflexors and hip flexors.. Sensory.:  Diminished touch pinprick sensation in the right hand in the ulnar nerve mediated dermatome.  Positive Tinel sign over the right ulnar groove Coordination: Rapid alternating movements normal in all extremities. Finger-to-nose and heel-to-shin performed accurately bilaterally. Gait and Station: Arises from chair  without difficulty. Stance is normal. Gait demonstrates slight dragging of the right leg and stiffness.  Not able to heel, toe and tandem walk without difficulty.  Reflexes: 1+ and symmetric. Toes downgoing.   NIHSS  0 Modified Rankin  2   ASSESSMENT: 50 year old Caucasian male with Covid infection in August 2020 complicated by Covid pneumonia, sepsis and left frontal small subcortical infarct etiology septic emboli versus Covid related hypercoagulability.  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 and he also has underlying diabetic chronic polyneuropathy.     PLAN: I had a long discussion with the patient and his uncle regarding his right hand weakness and wasting which is likely due to ulnar neuropathy etiology is unclear but probably acute during the hospitalization he also has baseline diabetic neuropathy and some weakness from his recent left subcortical infarct as well.  I recommend he stay on aspirin and Plavix for stroke prevention given history of multiple cardiac stents as well as maintain aggressive risk factor modification strict control of hypertension with blood pressure goal below 130/90, lipids with LDL cholesterol goal below 70 mg percent and diabetes with hemoglobin A1c goal below 6.5%.  He was encouraged to use his wheeled walker at all times and we discussed fall safety precautions.  Patient may also begin tapering Keppra to 250 mg twice daily for a month and stop since it was given mostly as a prophylactic agent to prevent seizures.  Also check EEG.  He  can increase his gabapentin night time dose to 2 tablets to help with his neuropathy pain.  He will return for follow-up in the future in 6 months with my nurse practitioner Shanda Bumps or call earlier if necessary.  Greater than 50% time during this 30-minute   visit was spent on counseling and coordination of care about his recent Covid related illness and stroke versus infection and answering questions Micheal Heady, MD Note: This document was prepared with digital dictation and possible smart phrase technology. Any transcriptional errors that result from this process are unintentional.

## 2020-08-06 ENCOUNTER — Other Ambulatory Visit: Payer: Self-pay

## 2020-08-06 ENCOUNTER — Ambulatory Visit (HOSPITAL_COMMUNITY): Payer: Medicaid Other | Attending: Internal Medicine

## 2020-08-06 ENCOUNTER — Other Ambulatory Visit (HOSPITAL_COMMUNITY): Payer: Medicaid Other

## 2020-08-06 DIAGNOSIS — I5022 Chronic systolic (congestive) heart failure: Secondary | ICD-10-CM | POA: Insufficient documentation

## 2020-08-06 LAB — ECHOCARDIOGRAM COMPLETE
Area-P 1/2: 2.07 cm2
S' Lateral: 3.5 cm

## 2020-08-06 MED ORDER — PERFLUTREN LIPID MICROSPHERE
1.0000 mL | INTRAVENOUS | Status: AC | PRN
  Administered 2020-08-06: 3 mL via INTRAVENOUS

## 2020-08-07 DIAGNOSIS — G4700 Insomnia, unspecified: Secondary | ICD-10-CM | POA: Diagnosis not present

## 2020-08-07 DIAGNOSIS — R0681 Apnea, not elsewhere classified: Secondary | ICD-10-CM | POA: Diagnosis not present

## 2020-08-07 DIAGNOSIS — G4719 Other hypersomnia: Secondary | ICD-10-CM | POA: Diagnosis not present

## 2020-08-07 DIAGNOSIS — F5104 Psychophysiologic insomnia: Secondary | ICD-10-CM | POA: Diagnosis not present

## 2020-08-07 DIAGNOSIS — E669 Obesity, unspecified: Secondary | ICD-10-CM | POA: Diagnosis not present

## 2020-08-07 DIAGNOSIS — R0683 Snoring: Secondary | ICD-10-CM | POA: Diagnosis not present

## 2020-08-12 ENCOUNTER — Ambulatory Visit: Payer: Medicaid Other | Admitting: Neurology

## 2020-08-12 DIAGNOSIS — R410 Disorientation, unspecified: Secondary | ICD-10-CM

## 2020-08-12 DIAGNOSIS — Z8673 Personal history of transient ischemic attack (TIA), and cerebral infarction without residual deficits: Secondary | ICD-10-CM

## 2020-08-19 ENCOUNTER — Telehealth: Payer: Self-pay | Admitting: *Deleted

## 2020-08-19 NOTE — Telephone Encounter (Signed)
I spoke to the patient and provided him with the EEG results.  

## 2020-08-19 NOTE — Telephone Encounter (Signed)
-----   Message from Micki Riley, MD sent at 08/19/2020  5:06 PM EDT ----- Joneen Roach inform the patient that EEG study was normal

## 2020-08-19 NOTE — Progress Notes (Signed)
Kindly inform the patient that EEG study was normal

## 2020-08-20 ENCOUNTER — Other Ambulatory Visit: Payer: Self-pay | Admitting: Cardiovascular Disease

## 2020-08-23 DIAGNOSIS — Z419 Encounter for procedure for purposes other than remedying health state, unspecified: Secondary | ICD-10-CM | POA: Diagnosis not present

## 2020-08-30 ENCOUNTER — Ambulatory Visit (INDEPENDENT_AMBULATORY_CARE_PROVIDER_SITE_OTHER): Payer: Medicaid Other | Admitting: Cardiovascular Disease

## 2020-08-30 ENCOUNTER — Encounter: Payer: Self-pay | Admitting: Cardiovascular Disease

## 2020-08-30 ENCOUNTER — Other Ambulatory Visit: Payer: Self-pay

## 2020-08-30 VITALS — BP 118/66 | HR 83 | Ht 74.0 in | Wt 250.0 lb

## 2020-08-30 DIAGNOSIS — E1165 Type 2 diabetes mellitus with hyperglycemia: Secondary | ICD-10-CM | POA: Diagnosis not present

## 2020-08-30 DIAGNOSIS — I2510 Atherosclerotic heart disease of native coronary artery without angina pectoris: Secondary | ICD-10-CM

## 2020-08-30 DIAGNOSIS — E785 Hyperlipidemia, unspecified: Secondary | ICD-10-CM

## 2020-08-30 DIAGNOSIS — I5022 Chronic systolic (congestive) heart failure: Secondary | ICD-10-CM

## 2020-08-30 DIAGNOSIS — I10 Essential (primary) hypertension: Secondary | ICD-10-CM | POA: Diagnosis not present

## 2020-08-30 DIAGNOSIS — Z720 Tobacco use: Secondary | ICD-10-CM

## 2020-08-30 DIAGNOSIS — Z794 Long term (current) use of insulin: Secondary | ICD-10-CM | POA: Diagnosis not present

## 2020-08-30 NOTE — Progress Notes (Signed)
Cardiology Office Note:   Date:  08/30/2020  NAME:  Micheal Bell    MRN: [id number] DOB:  Dec 19, 1970   PCP:  [name], MD  Cardiologist:  [name], MD  Electrophysiologist:  None   Referring MD: [name], MD   Chief Complaint  Patient presents with   Coronary Artery Disease   History of Present Illness:   Micheal Bell is a 50 y.o. male with a hx of DM, systolic HF with recovery of EF, CAD s/p PCI, HLD, HTN who presents for follow-up. EF has recovered. LDL at goal.  He reports he is doing well.  Denies any significant chest pain symptoms.  Still getting short of breath.  Remaining active.  Some dizziness upon standing.  BP 118/66.  His heart function has recovered.  I encouraged him to remain more active.  Cholesterol is at goal.  All of his numbers look well.  Most recent A1c 6.6.  He is doing really well.  Still using dip.  Encouraged to quit this.  We likely will continue aspirin and Plavix for a total of 1 year.  He reports he would like to have shoulder surgery he can interrupt DAPT for this.  Euvolemic on examination.  Overall doing well.   Problem List 1. CAD -abnormal stress/cardiomyopathy 01/10/2020 -3v CAD 02/12/2020: 90% PDA, 90% LCX; 95% LAD -CABG deferred due to deconditioning -2v PCI -> LAD/LCX 02/20/2020 2. Systolic HF with recovery of EF -EF 30-35% in setting of sepsis -EF improved to 55% at discharge (stress related) -insetting of MSSA sepsis/foot abscess/covid 19 PNA/cerebritis  -EF 55-60% 08/06/2020 3. DM -A1c 6.6 4. HTN 5. CVA -prior L frontal lobe 6. HLD -T chol 120, HDL 50, LDL 47, TG 131 7. DVT -R popliteal vein eliquis started 11/11/2019 -completed 3 months therapy  8. Smokeless tobacco -Dip user for 45 years   Past Medical History: Past Medical History:  Diagnosis Date   Arthritis    Asthma    Coronary artery disease    Diabetes mellitus without complication (HCC)    DVT (deep venous thrombosis) (HCC)    Fibromyalgia     Gout    Hypertension    IBS (irritable bowel syndrome)    Stroke [facility])    Vein disorder     Past Surgical History: Past Surgical History:  Procedure Laterality Date   CORONARY ATHERECTOMY N/A 02/20/2020   Procedure: CORONARY ATHERECTOMY;  Surgeon: [name], [name], MD;  Location: [facility] INVASIVE CV LAB;  Service: Cardiovascular;  Laterality: N/A;  CSI LAD   CORONARY STENT INTERVENTION N/A 02/20/2020   Procedure: CORONARY STENT INTERVENTION;  Surgeon: [name], [name], MD;  Location: [facility] INVASIVE CV LAB;  Service: Cardiovascular;  Laterality: N/A;  LAD CFX   FOOT SURGERY     INTRAVASCULAR ULTRASOUND/IVUS N/A 02/20/2020   Procedure: Intravascular Ultrasound/IVUS;  Surgeon: [name], [name], MD;  Location: [facility] INVASIVE CV LAB;  Service: Cardiovascular;  Laterality: N/A;   LEFT HEART CATH AND CORONARY ANGIOGRAPHY N/A 02/12/2020   Procedure: LEFT HEART CATH AND CORONARY ANGIOGRAPHY;  Surgeon: [name], MD;  Location: [facility] INVASIVE CV LAB;  Service: Cardiovascular;  Laterality: N/A;   TEE WITHOUT CARDIOVERSION N/A 10/24/2019   Procedure: TRANSESOPHAGEAL ECHOCARDIOGRAM (TEE);  Surgeon: [name], MD;  Location: [facility] ENDOSCOPY;  Service: Cardiovascular;  Laterality: N/A;    Current Medications: Current Meds  Medication Sig   ACCU-CHEK GUIDE test strip Use to check blood glucose twice daily   Accu-Chek Softclix  Lancets lancets 2 (two) times daily.   albuterol (PROVENTIL HFA;VENTOLIN HFA) 108 (90 Base) MCG/ACT inhaler Inhale into the lungs every 6 (six) hours as needed for wheezing or shortness of breath.   allopurinol (ZYLOPRIM) 300 MG tablet Take 300 mg by mouth daily.   aspirin EC 81 MG tablet Take 1 tablet (81 mg total) by mouth daily. Swallow whole.   buPROPion (WELLBUTRIN XL) 150 MG 24 hr tablet Take 150 mg by mouth every morning.   clopidogrel (PLAVIX) 75 MG tablet Take 1 tablet (75 mg total) by mouth daily.   cyclobenzaprine (FLEXERIL) 10 MG tablet Take 0.5 tablets (5 mg total) by  mouth 2 (two) times daily as needed for muscle spasms. (Patient taking differently: Take 10 mg by mouth 3 (three) times daily.)   empagliflozin (JARDIANCE) 10 MG TABS tablet Take 1 tablet (10 mg total) by mouth daily before breakfast.   fluticasone (FLONASE) 50 MCG/ACT nasal spray Place 1 spray into both nostrils daily.   furosemide (LASIX) 20 MG tablet Take 1 tablet (20 mg total) by mouth daily as needed for fluid or edema (weight gain of 3 lbs or more in a day or 5 lbs in a week).   gabapentin (NEURONTIN) 300 MG capsule Take 300 mg by mouth 3 (three) times daily. Take 1 capsule in morning and afternoon and 2 capsules at night   HYDROmorphone (DILAUDID) 2 MG tablet Take 2 mg by mouth 3 (three) times daily.   insulin glargine (LANTUS) 100 UNIT/ML Solostar Pen Inject 20 Units into the skin daily. (Patient taking differently: Inject 20 Units into the skin every evening.)   lidocaine (XYLOCAINE) 5 % ointment Apply 1 application topically as needed.   meloxicam (MOBIC) 15 MG tablet TAKE ONE TABLET BY MOUTH EVERY DAY WITH A MEAL FOR ARTHRITIS   mometasone-formoterol (DULERA) 200-5 MCG/ACT AERO Inhale 2 puffs into the lungs 2 (two) times daily.   montelukast (SINGULAIR) 10 MG tablet Take 10 mg by mouth at bedtime.   nortriptyline (PAMELOR) 25 MG capsule Take 25 mg by mouth at bedtime.   pantoprazole (PROTONIX) 40 MG tablet Take 40 mg by mouth daily.   PENTIPS 32G X 4 MM MISC See admin instructions. with insulin   RESTASIS 0.05 % ophthalmic emulsion 1 drop 2 (two) times daily.   rosuvastatin (CRESTOR) 20 MG tablet TAKE ONE TABLET BY MOUTH EVERY DAY   sacubitril-valsartan (ENTRESTO) 24-26 MG Take 1 tablet by mouth 2 (two) times daily.   spironolactone (ALDACTONE) 25 MG tablet Take 0.5 tablets (12.5 mg total) by mouth every other day.   triamcinolone (KENALOG) 0.1 % Apply 1 application topically 2 (two) times daily as needed (irritation).   zolpidem (AMBIEN) 10 MG tablet Take 10 mg by mouth at bedtime.      Allergies:    Azithromycin and Lodine [etodolac]   Social History: Social History   Socioeconomic History   Marital status: Single    Spouse name: Not on file   Number of children: Not on file   Years of education: Not on file   Highest education level: Not on file  Occupational History   Occupation: disabled  Tobacco Use   Smoking status: Never   Smokeless tobacco: Current    Types: Snuff  Vaping Use   Vaping Use: Never used  Substance and Sexual Activity   Alcohol use: Never   Drug use: Never   Sexual activity: Not on file  Other Topics Concern   Not on file  Social History Narrative  Lives alone, mom is next door   Right handed   Drinks no caffeine   Social Determinants of Health   Financial Resource Strain: Not on file  Food Insecurity: Not on file  Transportation Needs: Not on file  Physical Activity: Not on file  Stress: Not on file  Social Connections: Not on file     Family History: The patient's family history includes Diabetes in his mother; Heart disease in his father and mother.  ROS:   All other ROS reviewed and negative. Pertinent positives noted in the HPI.     EKGs/Labs/Other Studies Reviewed:   The following studies were personally reviewed by me today:   TTE 08/02/2020  1. Left ventricular ejection fraction, by estimation, is 55 to 60%. The  left ventricle has normal function. The left ventricle has no regional  wall motion abnormalities. Left ventricular diastolic parameters are  consistent with Grade I diastolic  dysfunction (impaired relaxation).   2. Right ventricular systolic function is normal. The right ventricular  size is normal. Tricuspid regurgitation signal is inadequate for assessing  PA pressure.   3. The mitral valve is abnormal. Trivial mitral valve regurgitation.   4. The aortic valve is tricuspid. Aortic valve regurgitation is not  visualized.   Recent Labs: 10/26/2019: TSH 4.137 11/08/2019: ALT 18 11/09/2019: B  Natriuretic Peptide 28.8 02/25/2020: Hemoglobin 11.3; Magnesium 1.5; Platelets 292 05/15/2020: BUN 10; Creatinine, Ser 0.89; Potassium 5.0; Sodium 138   Recent Lipid Panel    Component Value Date/Time   CHOL 120 05/15/2020 0000   TRIG 131 05/15/2020 0000   HDL 50 05/15/2020 0000   CHOLHDL 2.4 05/15/2020 0000   CHOLHDL 3.9 10/26/2019 0402   VLDL 26 10/26/2019 0402   LDLCALC 47 05/15/2020 0000    Physical Exam:   VS:  BP 118/66   Pulse 83   Ht 6\' 2"  (1.88 m)   Wt 250 lb (113.4 kg)   SpO2 99%   BMI 32.10 kg/m    Wt Readings from Last 3 Encounters:  08/30/20 250 lb (113.4 kg)  08/05/20 245 lb (111.1 kg)  05/15/20 233 lb 6.4 oz (105.9 kg)    General: Well nourished, well developed, in no acute distress Head: Atraumatic, normal size  Eyes: PEERLA, EOMI  Neck: Supple, no JVD Endocrine: No thryomegaly Cardiac: Normal S1, S2; RRR; no murmurs, rubs, or gallops Lungs: Clear to auscultation bilaterally, no wheezing, rhonchi or rales  Abd: Soft, nontender, no hepatomegaly  Ext: No edema, pulses 2+ Musculoskeletal: No deformities, BUE and BLE strength normal and equal Skin: Warm and dry, no rashes   Neuro: Alert and oriented to person, place, time, and situation, CNII-XII grossly intact, no focal deficits  Psych: Normal mood and affect   ASSESSMENT:   Micheal Bell is a 50 y.o. male who presents for the following: 1. Chronic systolic heart failure (HCC)   2. Coronary artery disease involving native coronary artery of native heart without angina pectoris   3. Hyperlipidemia with target LDL less than 70   4. Primary hypertension   5. Tobacco abuse     PLAN:   1. Chronic systolic heart failure (HCC), with recovery of EF 55-60% -Admitted to the hospital last year and found to have reduced EF in the setting of sepsis.  Found to have severe three-vessel CAD.  Underwent two-vessel PCI to the LAD and left circumflex.  Ejection fraction has recovered. -He is euvolemic on examination.   He overall is doing well.  He will  continue his Coreg 25 twice daily, Entresto 24-26 mg twice daily, Aldactone 12.5 mg daily.  He takes Jardiance 10 mg daily.  His EF has recovered.  He will continue current medications. -I recommended regular exercise.  2. Coronary artery disease involving native coronary artery of native heart without angina pectoris 3. Hyperlipidemia with target LDL less than 70 -Found to have three-vessel CAD as part of ischemic evaluation for systolic heart failure.  Was not a candidate for CABG.  Underwent PCI to the LAD and left circumflex.  Good result.  Has residual disease in the PDA which is small.  EF has recovered.  No significant chest pain symptoms.  We will continue with DAPT for 1 year.  Most recent cholesterol level is at goal.  BP well controlled.  Heart rate well controlled.  I have recommended proper diet and regular exercise.  Overall he is doing well.  4. Primary hypertension -No change in medications  5. Tobacco abuse -Still using smokeless tobacco.  Encouraged to quit.  Disposition: Return in about 6 months (around 03/02/2021).  Medication Adjustments/Labs and Tests Ordered: Current medicines are reviewed at length with the patient today.  Concerns regarding medicines are outlined above.  No orders of the defined types were placed in this encounter.  No orders of the defined types were placed in this encounter.   Patient Instructions  Medication Instructions:  The current medical regimen is effective;  continue present plan and medications.  *If you need a refill on your cardiac medications before your next appointment, please call your pharmacy*   Follow-Up: At [facility], you and your health needs are our priority.  As part of our continuing mission to provide you with exceptional heart care, we have created designated Provider Care Teams.  These Care Teams include your primary Cardiologist (physician) and Advanced Practice Providers (APPs -   Physician Assistants and Nurse Practitioners) who all work together to provide you with the care you need, when you need it.  We recommend signing up for the patient portal called "MyChart".  Sign up information is provided on this After Visit Summary.  MyChart is used to connect with patients for Virtual Visits (Telemedicine).  Patients are able to view lab/test results, encounter notes, upcoming appointments, etc.  Non-urgent messages can be sent to your provider as well.   To learn more about what you can do with MyChart, go to [email or web address].    Your next appointment:   6 month(s)  The format for your next appointment:   In Person  Provider:   Lennie Odor, MD      Time Spent with Patient: I have spent a total of 25 minutes with patient reviewing hospital notes, telemetry, EKGs, labs and examining the patient as well as establishing an assessment and plan that was discussed with the patient.  > 50% of time was spent in direct patient care.  Signed, Lenna Gilford. Flora Lipps, MD, [facility]  [facility]  [address], [address] [place], [place] [address] [phone]  08/30/2020 11:03 AM

## 2020-08-30 NOTE — Patient Instructions (Signed)
Medication Instructions:  The current medical regimen is effective;  continue present plan and medications.  *If you need a refill on your cardiac medications before your next appointment, please call your pharmacy*   Follow-Up: At [facility], you and your health needs are our priority.  As part of our continuing mission to provide you with exceptional heart care, we have created designated Provider Care Teams.  These Care Teams include your primary Cardiologist (physician) and Advanced Practice Providers (APPs -  Physician Assistants and Nurse Practitioners) who all work together to provide you with the care you need, when you need it.  We recommend signing up for the patient portal called "MyChart".  Sign up information is provided on this After Visit Summary.  MyChart is used to connect with patients for Virtual Visits (Telemedicine).  Patients are able to view lab/test results, encounter notes, upcoming appointments, etc.  Non-urgent messages can be sent to your provider as well.   To learn more about what you can do with MyChart, go to [email or web address].    Your next appointment:   6 month(s)  The format for your next appointment:   In Person  Provider:   Providence O'Neal, MD      

## 2020-09-04 DIAGNOSIS — M19211 Secondary osteoarthritis, right shoulder: Secondary | ICD-10-CM | POA: Diagnosis not present

## 2020-09-06 ENCOUNTER — Other Ambulatory Visit: Payer: Self-pay | Admitting: Orthopedic Surgery

## 2020-09-06 DIAGNOSIS — Z01811 Encounter for preprocedural respiratory examination: Secondary | ICD-10-CM

## 2020-09-09 ENCOUNTER — Other Ambulatory Visit: Payer: Self-pay | Admitting: Cardiovascular Disease

## 2020-09-09 ENCOUNTER — Telehealth: Payer: Self-pay

## 2020-09-09 NOTE — Telephone Encounter (Signed)
   [place] Pre-operative Risk Assessment    Patient Name: Micheal Bell  DOB: Feb 26, 1970 MRN: [id number]  HEARTCARE STAFF:  - IMPORTANT!!!!!! Under Visit Info/Reason for Call, type in Other and utilize the format Clearance MM/DD/YY or Clearance TBD. Do not use dashes or single digits. - Please review there is not already an duplicate clearance open for this procedure. - If request is for dental extraction, please clarify the # of teeth to be extracted. - If the patient is currently at the dentist's office, call Pre-Op Callback Staff (MA/nurse) to input urgent request.  - If the patient is not currently in the dentist office, please route to the Pre-Op pool.  Request for surgical clearance:  What type of surgery is being performed? Right Reverse Total Shoulder Arthroplasty    When is this surgery scheduled? 09/26/2020  What type of clearance is required (medical clearance vs. Pharmacy clearance to hold med vs. Both)? BOTH  Are there any medications that need to be held prior to surgery and how long? Plavix, Aspirin   Practice name and name of physician performing surgery? [facility]   What is the office phone number? [phone]   7.   What is the office fax number? [phone] - Marcelo Baldy   8.   Anesthesia type (None, local, MAC, general) ? CHOICE   Ena Dawley 09/09/2020, 2:57 PM  _________________________________________________________________   (provider comments below)

## 2020-09-09 NOTE — Telephone Encounter (Signed)
   Primary Cardiologist: [name], MD  Chart reviewed as part of pre-operative protocol coverage. Given past medical history and time since last visit, based on ACC/AHA guidelines, Micheal Bell would be at acceptable risk for the planned procedure without further cardiovascular testing.   His aspirin and Plavix may be held for 5-7 days prior to his surgery.  Please resume as soon as hemostasis is achieved.  I will route this recommendation to the requesting party via Epic fax function and remove from pre-op pool.  Please call with questions.  Thomasene Ripple. Jera Headings NP-C    09/09/2020, 3:06 PM [facility] [address] Office [phone] Fax [phone]

## 2020-09-18 NOTE — Patient Instructions (Addendum)
DUE TO COVID-19 ONLY ONE VISITOR IS ALLOWED TO COME WITH YOU AND STAY IN THE WAITING ROOM ONLY DURING PRE OP AND PROCEDURE DAY OF SURGERY. THE 2 VISITORS  MAY VISIT WITH YOU AFTER SURGERY IN YOUR PRIVATE ROOM DURING VISITING HOURS ONLY!  YOU NEED TO HAVE A COVID 19 TEST ON__8/2_____ @_[address] Between 8-3______, THIS TEST MUST BE DONE BEFORE SURGERY,                 Micheal Bell     Your procedure is scheduled on: 09/26/20   Report to [facility] Main  Entrance   Report to admitting at  9:55 AM     Call this number if you have problems the morning of surgery [phone]     BRUSH YOUR TEETH MORNING OF SURGERY AND RINSE YOUR MOUTH OUT, NO CHEWING GUM CANDY OR MINTS.   No food after midnight.    You may have clear liquid until 9:30 AM.     CLEAR LIQUID DIET   Foods Allowed                                                                     Foods Excluded  Coffee and tea, regular and decaf                             liquids that you cannot  Plain Jell-O any favor except red or purple                                           see through such as: Fruit ices (not with fruit pulp)                                     milk, soups, orange juice  Iced Popsicles                                    All solid food Carbonated beverages, regular and diet                                    Cranberry, grape and apple juices Sports drinks like Gatorade Lightly seasoned clear broth or consume(fat free) Sugar, honey syrup       At 9:00 AM drink pre surgery drink.   Nothing by mouth after 8:30 AM.     Take these medicines the morning of surgery with A SIP OF WATER: Imdur, Metoprolol, Entresto, Gabapentin, Wellbutrin, Carvedilol, Pantoprazole. Use your inhaler and bring it with you   How to Manage Your Diabetes Before and After Surgery  Why is it important to control my blood sugar before and after surgery? Improving blood sugar levels before and after surgery  helps healing and can limit problems. A way of improving blood sugar control is eating a healthy diet by:  Eating less sugar  and carbohydrates  Increasing activity/exercise  Talking with your doctor about reaching your blood sugar goals High blood sugars (greater than 180 mg/dL) can raise your risk of infections and slow your recovery, so you will need to focus on controlling your diabetes during the weeks before surgery. Make sure that the doctor who takes care of your diabetes knows about your planned surgery including the date and location.  How do I manage my blood sugar before surgery? Check your blood sugar at least 4 times a day, starting 2 days before surgery, to make sure that the level is not too high or low. Check your blood sugar the morning of your surgery when you wake up and every 2 hours until you get to the Short Stay unit. If your blood sugar is less than 70 mg/dL, you will need to treat for low blood sugar: Do not take insulin. Treat a low blood sugar (less than 70 mg/dL) with  cup of clear juice (cranberry or apple), 4 glucose tablets, OR glucose gel. Recheck blood sugar in 15 minutes after treatment (to make sure it is greater than 70 mg/dL). If your blood sugar is not greater than 70 mg/dL on recheck, call [phone] for further instructions. Report your blood sugar to the short stay nurse when you get to Short Stay.  If you are admitted to the hospital after surgery: Your blood sugar will be checked by the staff and you will probably be given insulin after surgery (instead of oral diabetes medicines) to make sure you have good blood sugar levels. The goal for blood sugar control after surgery is 80-180 mg/dL.   WHAT DO I DO ABOUT MY DIABETES MEDICATION? Do not take Jardiance the day before surgery.  Do not take oral diabetes medicines (pills) the morning of surgery.  THE NIGHT BEFORE SURGERY, take 15    units of  Glargine  insulin.                                       You may not have any metal on your body including              piercings  Do not wear jewelry, lotions, powders or deodorant                          Men may shave face and neck.   Do not bring valuables to the hospital. [facility] IS NOT             RESPONSIBLE   FOR VALUABLES.  Contacts, dentures or bridgework may not be worn into surgery.       Patients discharged the day of surgery will not be allowed to drive home.   IF YOU ARE HAVING SURGERY AND GOING HOME THE SAME DAY, YOU MUST HAVE AN ADULT TO DRIVE YOU HOME AND BE WITH YOU FOR 24 HOURS. YOU MAY GO HOME BY TAXI OR UBER OR ORTHERWISE, BUT AN ADULT MUST ACCOMPANY YOU HOME AND STAY WITH YOU FOR 24 HOURS.  Name and phone number of your driver:  Special Instructions: N/A              Please read over the following fact sheets you were given: _____________________________________________________________________             [facility]- Preparing for Total Shoulder Arthroplasty    Before surgery,  you can play an important role. Because skin is not sterile, your skin needs to be as free of germs as possible. You can reduce the number of germs on your skin by using the following products. Benzoyl Peroxide Gel Reduces the number of germs present on the skin Applied twice a day to shoulder area starting two days before surgery    ==================================================================  Please follow these instructions carefully:  BENZOYL PEROXIDE 5% GEL  Please do not use if you have an allergy to benzoyl peroxide.   If your skin becomes reddened/irritated stop using the benzoyl peroxide.  Starting two days before surgery, apply as follows: Apply benzoyl peroxide in the morning and at night. Apply after taking a shower. If you are not taking a shower clean entire shoulder front, back, and side along with the armpit with a clean wet washcloth.  Place a quarter-sized dollop on your shoulder and rub in  thoroughly, making sure to cover the front, back, and side of your shoulder, along with the armpit.   2 days before ____ AM   ____ PM              1 day before ____ AM   ____ PM                         Do this twice a day for two days.  (Last application is the night before surgery, AFTER using the CHG soap as described below).  Do NOT apply benzoyl peroxide gel on the day of surgery.    [facility] - Preparing for Surgery Before surgery, you can play an important role.  Because skin is not sterile, your skin needs to be as free of germs as possible.  You can reduce the number of germs on your skin by washing with CHG (chlorahexidine gluconate) soap before surgery.  CHG is an antiseptic cleaner which kills germs and bonds with the skin to continue killing germs even after washing. Please DO NOT use if you have an allergy to CHG or antibacterial soaps.  If your skin becomes reddened/irritated stop using the CHG and inform your nurse when you arrive at Short Stay. Do not shave (including legs and underarms) for at least 48 hours prior to the first CHG shower.  You may shave your face/neck. Please follow these instructions carefully:  1.  Shower with CHG Soap the night before surgery and the  morning of Surgery.  2.  If you choose to wash your hair, wash your hair first as usual with your  normal  shampoo.  3.  After you shampoo, rinse your hair and body thoroughly to remove the  shampoo.                                        4.  Use CHG as you would any other liquid soap.  You can apply chg directly  to the skin and wash                       Gently with a scrungie or clean washcloth.  5.  Apply the CHG Soap to your body ONLY FROM THE NECK DOWN.   Do not use on face/ open  Wound or open sores. Avoid contact with eyes, ears mouth and genitals (private parts).                       Wash face,  Genitals (private parts) with your normal soap.             6.  Wash thoroughly,  paying special attention to the area where your surgery  will be performed.  7.  Thoroughly rinse your body with warm water from the neck down.  8.  DO NOT shower/wash with your normal soap after using and rinsing off  the CHG Soap.             9.  Pat yourself dry with a clean towel.            10.  Wear clean pajamas.            11.  Place clean sheets on your bed the night of your first shower and do not  sleep with pets. Day of Surgery : Do not apply any lotions/deodorants the morning of surgery.  Please wear clean clothes to the hospital/surgery center.  FAILURE TO FOLLOW THESE INSTRUCTIONS MAY RESULT IN THE CANCELLATION OF YOUR SURGERY PATIENT SIGNATURE_________________________________  NURSE SIGNATURE__________________________________  ________________________________________________________________________   Rogelia Mire  An incentive spirometer is a tool that can help keep your lungs clear and active. This tool measures how well you are filling your lungs with each breath. Taking long deep breaths may help reverse or decrease the chance of developing breathing (pulmonary) problems (especially infection) following: A long period of time when you are unable to move or be active. BEFORE THE PROCEDURE  If the spirometer includes an indicator to show your best effort, your nurse or respiratory therapist will set it to a desired goal. If possible, sit up straight or lean slightly forward. Try not to slouch. Hold the incentive spirometer in an upright position. INSTRUCTIONS FOR USE  Sit on the edge of your bed if possible, or sit up as far as you can in bed or on a chair. Hold the incentive spirometer in an upright position. Breathe out normally. Place the mouthpiece in your mouth and seal your lips tightly around it. Breathe in slowly and as deeply as possible, raising the piston or the ball toward the top of the column. Hold your breath for 3-5 seconds or for as long as  possible. Allow the piston or ball to fall to the bottom of the column. Remove the mouthpiece from your mouth and breathe out normally. Rest for a few seconds and repeat Steps 1 through 7 at least 10 times every 1-2 hours when you are awake. Take your time and take a few normal breaths between deep breaths. The spirometer may include an indicator to show your best effort. Use the indicator as a goal to work toward during each repetition. After each set of 10 deep breaths, practice coughing to be sure your lungs are clear. If you have an incision (the cut made at the time of surgery), support your incision when coughing by placing a pillow or rolled up towels firmly against it. Once you are able to get out of bed, walk around indoors and cough well. You may stop using the incentive spirometer when instructed by your caregiver.  RISKS AND COMPLICATIONS Take your time so you do not get dizzy or light-headed. If you are in pain, you may need to take or ask for pain  medication before doing incentive spirometry. It is harder to take a deep breath if you are having pain. AFTER USE Rest and breathe slowly and easily. It can be helpful to keep track of a log of your progress. Your caregiver can provide you with a simple table to help with this. If you are using the spirometer at home, follow these instructions: SEEK MEDICAL CARE IF:  You are having difficultly using the spirometer. You have trouble using the spirometer as often as instructed. Your pain medication is not giving enough relief while using the spirometer. You develop fever of 100.5 F (38.1 C) or higher. SEEK IMMEDIATE MEDICAL CARE IF:  You cough up bloody sputum that had not been present before. You develop fever of 102 F (38.9 C) or greater. You develop worsening pain at or near the incision site. MAKE SURE YOU:  Understand these instructions. Will watch your condition. Will get help right away if you are not doing well or get  worse. Document Released: 06/22/2006 Document Revised: 05/04/2011 Document Reviewed: 08/23/2006 [facility] Patient Information 2014 [employer], [place].   ________________________________________________________________________

## 2020-09-19 ENCOUNTER — Encounter (HOSPITAL_COMMUNITY)
Admission: RE | Admit: 2020-09-19 | Discharge: 2020-09-19 | Disposition: A | Discharge status: Home / Self Care | Payer: Medicaid Other | Source: Ambulatory Visit | Attending: Orthopedic Surgery | Admitting: Orthopedic Surgery

## 2020-09-19 ENCOUNTER — Other Ambulatory Visit: Payer: Self-pay

## 2020-09-19 ENCOUNTER — Encounter (HOSPITAL_COMMUNITY): Payer: Self-pay

## 2020-09-19 ENCOUNTER — Ambulatory Visit (HOSPITAL_COMMUNITY)
Admission: RE | Admit: 2020-09-19 | Discharge: 2020-09-19 | Disposition: A | Discharge status: Home / Self Care | Payer: Medicaid Other | Source: Ambulatory Visit | Attending: Orthopedic Surgery | Admitting: Orthopedic Surgery

## 2020-09-19 DIAGNOSIS — M12811 Other specific arthropathies, not elsewhere classified, right shoulder: Secondary | ICD-10-CM | POA: Insufficient documentation

## 2020-09-19 DIAGNOSIS — Z7901 Long term (current) use of anticoagulants: Secondary | ICD-10-CM | POA: Diagnosis not present

## 2020-09-19 DIAGNOSIS — Z7982 Long term (current) use of aspirin: Secondary | ICD-10-CM | POA: Insufficient documentation

## 2020-09-19 DIAGNOSIS — Z794 Long term (current) use of insulin: Secondary | ICD-10-CM | POA: Insufficient documentation

## 2020-09-19 DIAGNOSIS — I509 Heart failure, unspecified: Secondary | ICD-10-CM | POA: Diagnosis not present

## 2020-09-19 DIAGNOSIS — Z01811 Encounter for preprocedural respiratory examination: Secondary | ICD-10-CM | POA: Diagnosis not present

## 2020-09-19 DIAGNOSIS — E119 Type 2 diabetes mellitus without complications: Secondary | ICD-10-CM | POA: Insufficient documentation

## 2020-09-19 DIAGNOSIS — I2510 Atherosclerotic heart disease of native coronary artery without angina pectoris: Secondary | ICD-10-CM | POA: Diagnosis not present

## 2020-09-19 DIAGNOSIS — Z79899 Other long term (current) drug therapy: Secondary | ICD-10-CM | POA: Insufficient documentation

## 2020-09-19 DIAGNOSIS — I110 Hypertensive heart disease with heart failure: Secondary | ICD-10-CM | POA: Insufficient documentation

## 2020-09-19 DIAGNOSIS — Z86718 Personal history of other venous thrombosis and embolism: Secondary | ICD-10-CM | POA: Diagnosis not present

## 2020-09-19 DIAGNOSIS — Z01818 Encounter for other preprocedural examination: Secondary | ICD-10-CM | POA: Insufficient documentation

## 2020-09-19 HISTORY — DX: Dyspnea, unspecified: R06.00

## 2020-09-19 HISTORY — DX: Depression, unspecified: F32.A

## 2020-09-19 HISTORY — DX: Personal history of urinary calculi: Z87.442

## 2020-09-19 HISTORY — DX: Anxiety disorder, unspecified: F41.9

## 2020-09-19 LAB — COMPREHENSIVE METABOLIC PANEL
ALT: 24 U/L (ref 0–44)
AST: 31 U/L (ref 15–41)
Albumin: 4.3 g/dL (ref 3.5–5.0)
Alkaline Phosphatase: 111 U/L (ref 38–126)
Anion gap: 7 (ref 5–15)
BUN: 9 mg/dL (ref 6–20)
CO2: 27 mmol/L (ref 22–32)
Calcium: 9.3 mg/dL (ref 8.9–10.3)
Chloride: 104 mmol/L (ref 98–111)
Creatinine, Ser: 1.03 mg/dL (ref 0.61–1.24)
GFR, Estimated: >60 mL/min (ref >60)
Glucose, Bld: 145 mg/dL — ABNORMAL HIGH (ref 70–99)
Potassium: 4.3 mmol/L (ref 3.5–5.1)
Sodium: 138 mmol/L (ref 135–145)
Total Bilirubin: 0.9 mg/dL (ref 0.3–1.2)
Total Protein: 7 g/dL (ref 6.5–8.1)

## 2020-09-19 LAB — CBC WITH DIFFERENTIAL/PLATELET
Abs Immature Granulocytes: 0.03 10*3/uL (ref 0.00–0.07)
Basophils Absolute: 0.1 10*3/uL (ref 0.0–0.1)
Basophils Relative: 1 %
Eosinophils Absolute: 0.2 10*3/uL (ref 0.0–0.5)
Eosinophils Relative: 3 %
HCT: 39.3 % (ref 39.0–52.0)
Hemoglobin: 12.8 g/dL — ABNORMAL LOW (ref 13.0–17.0)
Immature Granulocytes: 1 %
Lymphocytes Relative: 29 %
Lymphs Abs: 1.8 10*3/uL (ref 0.7–4.0)
MCH: 30.9 pg (ref 26.0–34.0)
MCHC: 32.6 g/dL (ref 30.0–36.0)
MCV: 94.9 fL (ref 80.0–100.0)
Monocytes Absolute: 0.8 10*3/uL (ref 0.1–1.0)
Monocytes Relative: 12 %
Neutro Abs: 3.4 10*3/uL (ref 1.7–7.7)
Neutrophils Relative %: 54 %
Platelets: 175 10*3/uL (ref 150–400)
RBC: 4.14 MIL/uL — ABNORMAL LOW (ref 4.22–5.81)
RDW: 13.5 % (ref 11.5–15.5)
WBC: 6.2 10*3/uL (ref 4.0–10.5)
nRBC: 0 % (ref 0.0–0.2)

## 2020-09-19 LAB — URINALYSIS, ROUTINE W REFLEX MICROSCOPIC
Bilirubin Urine: NEGATIVE
Glucose, UA: >=500 mg/dL — AB
Hgb urine dipstick: NEGATIVE
Ketones, ur: NEGATIVE mg/dL
Nitrite: NEGATIVE
Protein, ur: NEGATIVE mg/dL
Specific Gravity, Urine: 1.026 (ref 1.005–1.030)
WBC, UA: >50 WBC/hpf — ABNORMAL HIGH (ref 0–5)
pH: 6 (ref 5.0–8.0)

## 2020-09-19 LAB — SURGICAL PCR SCREEN
MRSA, PCR: NEGATIVE
Staphylococcus aureus: POSITIVE — AB

## 2020-09-19 LAB — APTT: aPTT: 32 seconds (ref 24–36)

## 2020-09-19 LAB — GLUCOSE, CAPILLARY: Glucose-Capillary: 155 mg/dL — ABNORMAL HIGH (ref 70–99)

## 2020-09-19 IMAGING — DX DG CHEST 2V
2 series · 2 of 2 positions shown · non-contrast
Comparison: [DATE].

CLINICAL DATA: Preop shoulder arthroplasty.

EXAM:
CHEST - 2 VIEW

[chest pa]
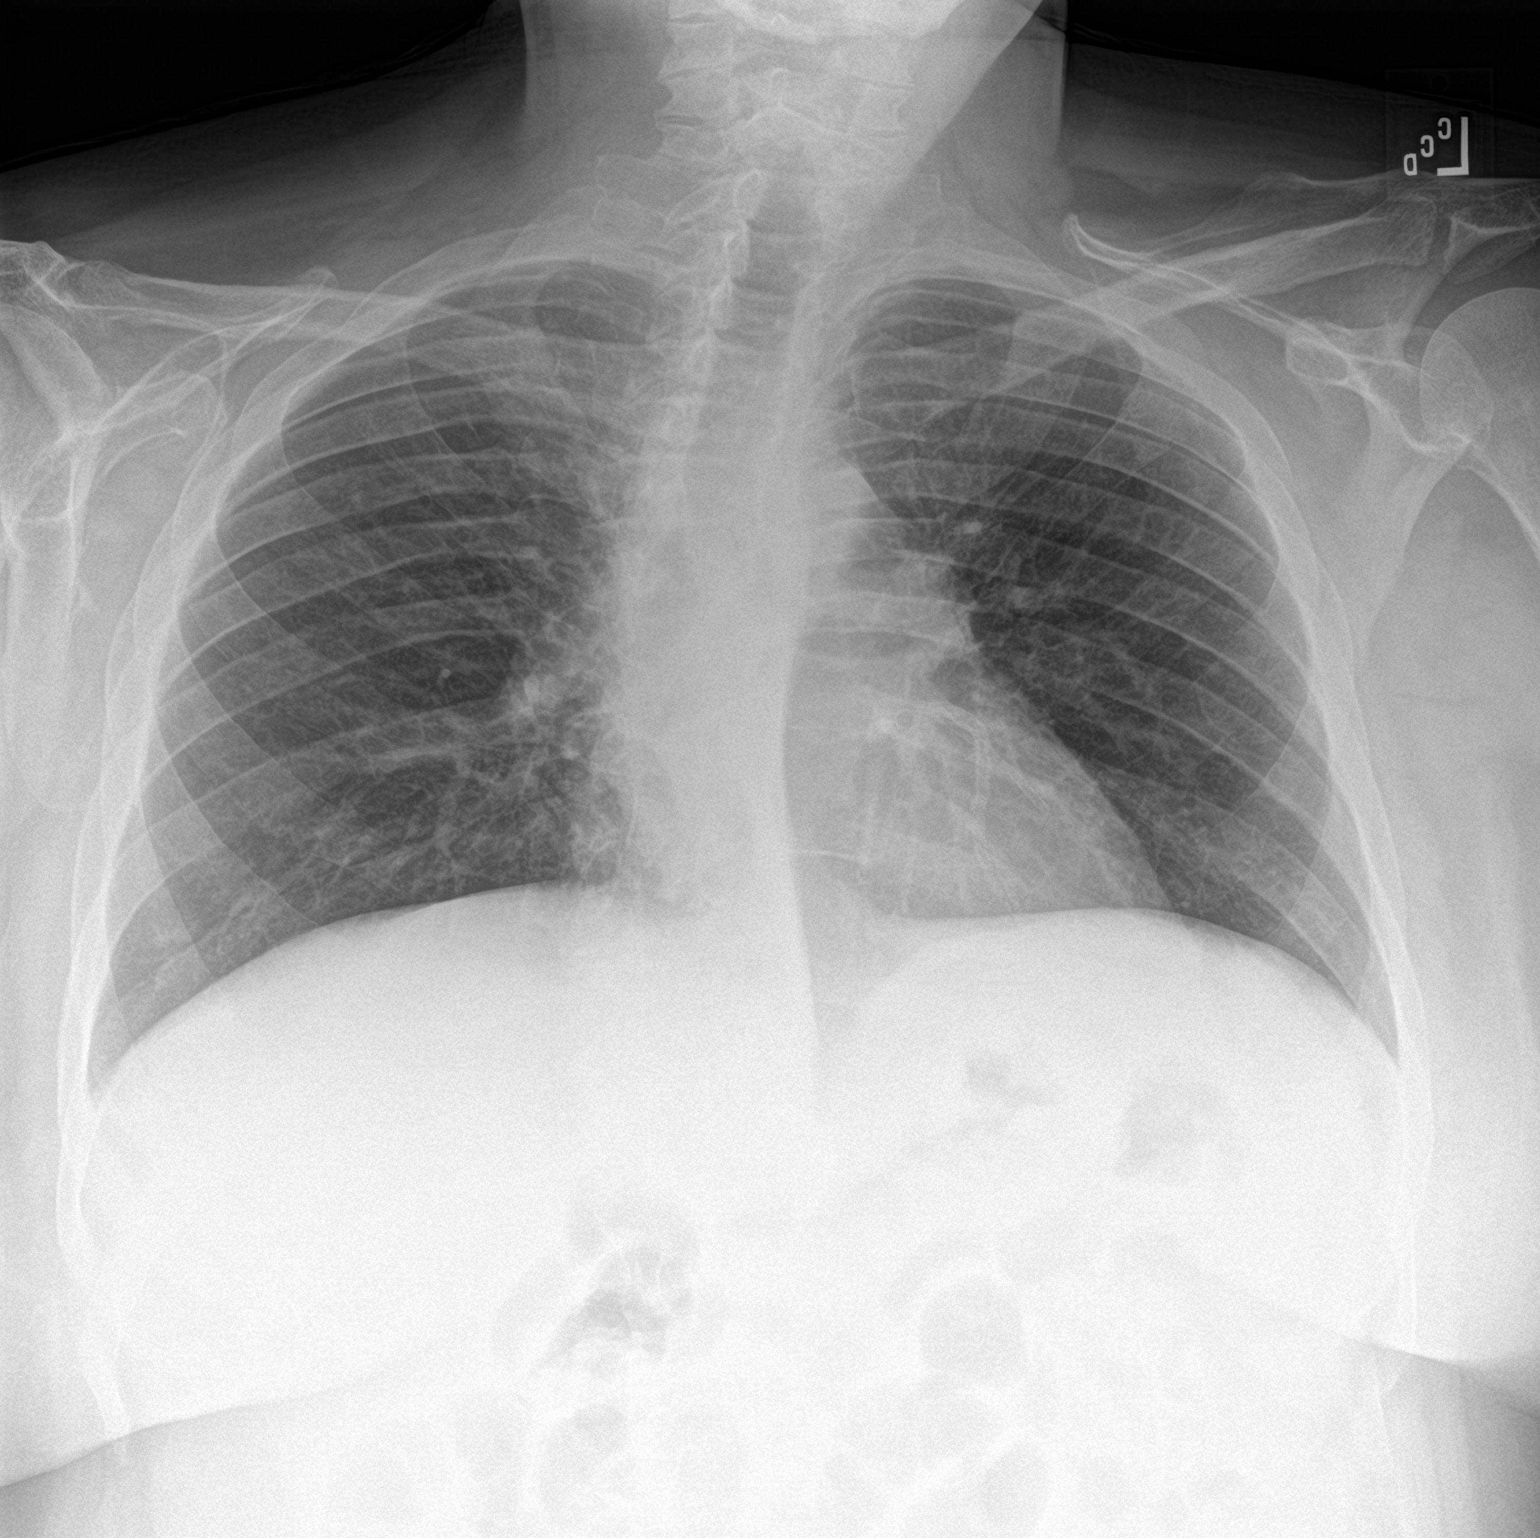

[chest lat]
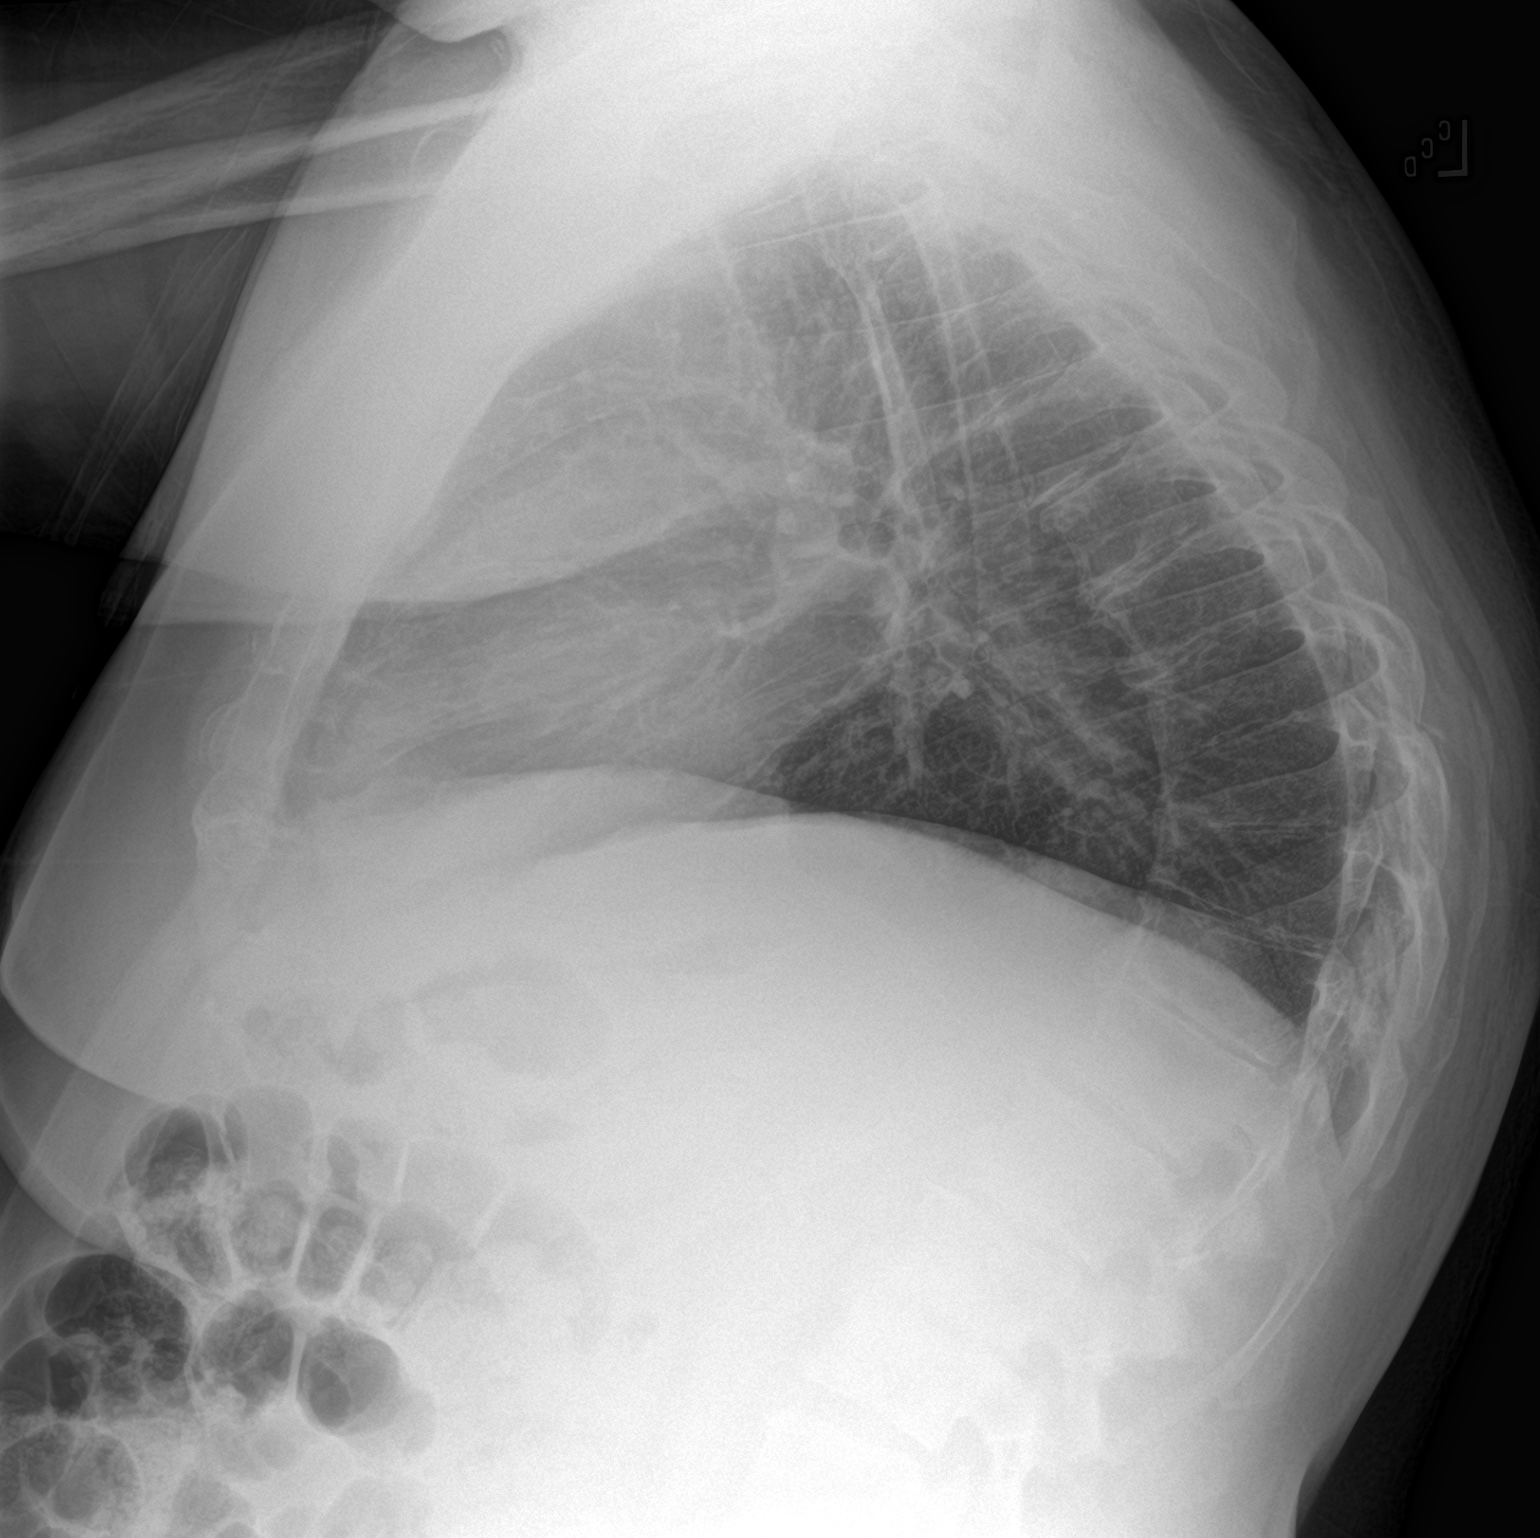

[2 of 2 positions shown; findings below may reference images not displayed]

FINDINGS: Interval removal of right PICC line. Mediastinum hilar structures
normal. Heart size normal. Low lung volumes. No acute
cardiopulmonary disease. No pleural effusion or pneumothorax.
IMPRESSION: Low lung volumes.  No acute cardiopulmonary disease.

## 2020-09-19 NOTE — Progress Notes (Addendum)
COVID Vaccine Completed:Yes Date COVID Vaccine completed:09/2019 COVID vaccine manufacturer: Pfizer     PCP - Dr. Andrey Campanile LOV 06/20/20 Cardiologist - Dr. Carylon Perches  Chest x-ray - no EKG - 09/06/20-epic Stress Test - no ECHO - 08/06/20-epic Cardiac Cath - 02/12/20, 02/20/20-epic Pacemaker/ICD device last checked:NA  Sleep Study - yes CPAP - he lost part of it and part broke. He "can't afford to keep up with it"  Fasting Blood Sugar - 120-180 Checks Blood Sugar _BID____ times a day  Blood Thinner Instructions:ASA and Plavix /Dr. Flora Lipps Aspirin Instructions:Hold 5-7 days/ Dr. Flora Lipps Last Dose:09/20/20  Anesthesia review: yes  Patient denies shortness of breath, fever, cough and chest pain at PAT appointment Pt is SOB with most activities since his CVA and MI in 09/2019. He uses a walker or cane at home.  He has weakness on his Rt side  but is generally debilitated. He lives in a camper [place] his [place]. His Uncle takes care of him.  Patient verbalized understanding of instructions that were given to them at the PAT appointment. Patient was also instructed that they will need to review over the PAT instructions again at home before surgery. yes

## 2020-09-20 NOTE — Progress Notes (Signed)
Anesthesia Chart Review   Case: [id number] Date/Time: 09/26/20 1209   Procedure: REVERSE SHOULDER ARTHROPLASTY (Right: Shoulder)   Anesthesia type: Choice   Pre-op diagnosis: RIGHT SHOULDER ROTATOR CUFF ARTHOPATHY   Location: [facility] ROOM 07 / [facility]   Surgeons: Jones Broom, MD       DISCUSSION:50 y.o. never smoker with h/o HTN, DM II (A1C 6.6 08/30/2020), CHF, CAD (PCI), stroke, DVT, right shoulder rotator scheduled for above procedure 09/26/2020 with Dr. Jones Broom.   Per cardiology preoperative evaluation 09/09/2020, "Chart reviewed as part of pre-operative protocol coverage. Given past medical history and time since last visit, based on ACC/AHA guidelines, DILLAN CANDELA would be at acceptable risk for the planned procedure without further cardiovascular testing.    His aspirin and Plavix may be held for 5-7 days prior to his surgery.  Please resume as soon as hemostasis is achieved."  Anticipate pt can proceed with planned procedure barring acute status change.   VS: BP 101/66   Pulse 77   Temp (!) 36.2 C (Oral)   Resp 18   Ht 6\' 2"  (1.88 m)   Wt 112 kg   SpO2 100%   BMI 31.71 kg/m   PROVIDERS: , MD is PCP   Primary Cardiologist: [name], MD  LABS: Labs reviewed: Acceptable for surgery. (all labs ordered are listed, but only abnormal results are displayed)  Labs Reviewed  SURGICAL PCR SCREEN - Abnormal; Notable for the following components:      Result Value   Staphylococcus aureus POSITIVE (*)    All other components within normal limits  CBC WITH DIFFERENTIAL/PLATELET - Abnormal; Notable for the following components:   RBC 4.14 (*)    Hemoglobin 12.8 (*)    All other components within normal limits  COMPREHENSIVE METABOLIC PANEL - Abnormal; Notable for the following components:   Glucose, Bld 145 (*)    All other components within normal limits  URINALYSIS, ROUTINE W REFLEX MICROSCOPIC - Abnormal; Notable for the following components:    APPearance HAZY (*)    Glucose, UA >=500 (*)    Leukocytes,Ua LARGE (*)    WBC, UA >50 (*)    Bacteria, UA RARE (*)    All other components within normal limits  GLUCOSE, CAPILLARY - Abnormal; Notable for the following components:   Glucose-Capillary 155 (*)    All other components within normal limits  APTT  TYPE AND SCREEN     IMAGES:   EKG: 09/19/2020 Rate 76 bpm  NSR Minimal voltage criteria for LVH, may be normal variant  CV: Echo 08/06/2020  1. Left ventricular ejection fraction, by estimation, is 55 to 60%. The  left ventricle has normal function. The left ventricle has no regional  wall motion abnormalities. Left ventricular diastolic parameters are  consistent with Grade I diastolic  dysfunction (impaired relaxation).   2. Right ventricular systolic function is normal. The right ventricular  size is normal. Tricuspid regurgitation signal is inadequate for assessing  PA pressure.   3. The mitral valve is abnormal. Trivial mitral valve regurgitation.   4. The aortic valve is tricuspid. Aortic valve regurgitation is not  visualized.  Past Medical History:  Diagnosis Date   Anxiety    Arthritis    Asthma    inhaler daily   CHF (congestive heart failure) (HCC)    Coronary artery disease    Depression    Diabetes mellitus without complication (HCC)    DVT (deep venous thrombosis) (HCC)  Dyspnea    Fibromyalgia    Gout    History of kidney stones    Hypertension    IBS (irritable bowel syndrome)    Myocardial infarction (HCC) 09/2019   Pneumonia 09/2019   Stroke (HCC) 09/2019   Vein disorder     Past Surgical History:  Procedure Laterality Date   CORONARY ATHERECTOMY N/A 02/20/2020   Procedure: CORONARY ATHERECTOMY;  Surgeon: [name], [name], MD;  Location: [facility] INVASIVE CV LAB;  Service: Cardiovascular;  Laterality: N/A;  CSI LAD   CORONARY STENT INTERVENTION N/A 02/20/2020   Procedure: CORONARY STENT INTERVENTION;  Surgeon: [name], [name], MD;   Location: [facility] INVASIVE CV LAB;  Service: Cardiovascular;  Laterality: N/A;  LAD CFX   FOOT SURGERY     INTRAVASCULAR ULTRASOUND/IVUS N/A 02/20/2020   Procedure: Intravascular Ultrasound/IVUS;  Surgeon: [name], [name], MD;  Location: [facility] INVASIVE CV LAB;  Service: Cardiovascular;  Laterality: N/A;   LEFT HEART CATH AND CORONARY ANGIOGRAPHY N/A 02/12/2020   Procedure: LEFT HEART CATH AND CORONARY ANGIOGRAPHY;  Surgeon: [name], MD;  Location: [facility] INVASIVE CV LAB;  Service: Cardiovascular;  Laterality: N/A;   TEE WITHOUT CARDIOVERSION N/A 10/24/2019   Procedure: TRANSESOPHAGEAL ECHOCARDIOGRAM (TEE);  Surgeon: [name], MD;  Location: [facility] ENDOSCOPY;  Service: Cardiovascular;  Laterality: N/A;    MEDICATIONS:  ACCU-CHEK GUIDE test strip   Accu-Chek Softclix Lancets lancets   albuterol (PROVENTIL HFA;VENTOLIN HFA) 108 (90 Base) MCG/ACT inhaler   albuterol (PROVENTIL) (2.5 MG/3ML) 0.083% nebulizer solution   allopurinol (ZYLOPRIM) 300 MG tablet   apixaban (ELIQUIS) 5 MG TABS tablet   aspirin EC 81 MG tablet   buPROPion (WELLBUTRIN XL) 150 MG 24 hr tablet   carvedilol (COREG) 12.5 MG tablet   clopidogrel (PLAVIX) 75 MG tablet   cyclobenzaprine (FLEXERIL) 10 MG tablet   empagliflozin (JARDIANCE) 10 MG TABS tablet   ENTRESTO 24-26 MG   fluticasone (FLONASE) 50 MCG/ACT nasal spray   furosemide (LASIX) 20 MG tablet   gabapentin (NEURONTIN) 300 MG capsule   HYDROmorphone (DILAUDID) 4 MG tablet   insulin glargine (LANTUS) 100 UNIT/ML Solostar Pen   isosorbide mononitrate (IMDUR) 30 MG 24 hr tablet   levETIRAcetam (KEPPRA) 500 MG tablet   lidocaine (XYLOCAINE) 5 % ointment   losartan (COZAAR) 25 MG tablet   meloxicam (MOBIC) 15 MG tablet   metoprolol tartrate (LOPRESSOR) 50 MG tablet   mometasone-formoterol (DULERA) 200-5 MCG/ACT AERO   montelukast (SINGULAIR) 10 MG tablet   Multiple Vitamins-Minerals (MULTIVITAMIN WITH MINERALS) tablet   nitroGLYCERIN (NITROSTAT) 0.4 MG SL tablet    nortriptyline (PAMELOR) 25 MG capsule   pantoprazole (PROTONIX) 40 MG tablet   PENTIPS 32G X 4 MM MISC   potassium chloride SA (KLOR-CON) 20 MEQ tablet   RESTASIS 0.05 % ophthalmic emulsion   rosuvastatin (CRESTOR) 20 MG tablet   spironolactone (ALDACTONE) 25 MG tablet   tobramycin-dexamethasone (TOBRADEX) ophthalmic ointment   triamcinolone (KENALOG) 0.1 %   zolpidem (AMBIEN) 10 MG tablet   No current facility-administered medications for this encounter.    regadenoson (LEXISCAN) injection SOLN 0.4 mg     Janey Genta [facility] Pre-Surgical Testing [phone]

## 2020-09-20 NOTE — Anesthesia Preprocedure Evaluation (Addendum)
Anesthesia Evaluation  Patient identified by MRN, date of birth, ID band Patient awake    Reviewed: Allergy & Precautions, NPO status , Patient's Chart, lab work & pertinent test results, reviewed documented beta blocker date and time   History of Anesthesia Complications Negative for: history of anesthetic complications  Airway Mallampati: II  TM Distance: >3 FB Neck ROM: Full    Dental  (+) Dental Advisory Given, Poor Dentition, Chipped, Missing   Pulmonary asthma , sleep apnea ,    Pulmonary exam normal        Cardiovascular hypertension, Pt. on medications and Pt. on home beta blockers + CAD, + Past MI, +CHF and + DVT  Normal cardiovascular exam   '22 TTE - EF 55 to 60%. Grade I diastolic dysfunction (impaired relaxation). Trivial mitral valve regurgitation.     Neuro/Psych PSYCHIATRIC DISORDERS Anxiety Depression CVA (right sided weakness including right hand dysfunction), Residual Symptoms    GI/Hepatic negative GI ROS, Neg liver ROS,   Endo/Other  diabetes, Poorly Controlled, Type 2, Insulin Dependent, Oral Hypoglycemic Agents Obesity   Renal/GU negative Renal ROS     Musculoskeletal  (+) Arthritis , Fibromyalgia - Gout    Abdominal   Peds  Hematology  (+) anemia ,  On plavix    Anesthesia Other Findings   Reproductive/Obstetrics                           Anesthesia Physical Anesthesia Plan  ASA: 3  Anesthesia Plan: General   Post-op Pain Management:  Regional for Post-op pain   Induction: Intravenous  PONV Risk Score and Plan: 2 and Treatment may vary due to age or medical condition and Ondansetron  Airway Management Planned: Oral ETT  Additional Equipment: None  Intra-op Plan:   Post-operative Plan: Extubation in OR  Informed Consent: I have reviewed the patients History and Physical, chart, labs and discussed the procedure including the risks, benefits and  alternatives for the proposed anesthesia with the patient or authorized representative who has indicated his/her understanding and acceptance.     Dental advisory given  Plan Discussed with: CRNA and Anesthesiologist  Anesthesia Plan Comments:       Anesthesia Quick Evaluation

## 2020-09-24 ENCOUNTER — Other Ambulatory Visit: Payer: Self-pay | Admitting: Orthopedic Surgery

## 2020-09-24 LAB — SARS CORONAVIRUS 2 (TAT 6-24 HRS): SARS Coronavirus 2: POSITIVE — AB

## 2020-09-24 NOTE — Progress Notes (Signed)
[facility] OR made aware of the pt's positive covid result from [place].

## 2020-09-26 LAB — TYPE AND SCREEN
ABO/RH(D): O POS
Antibody Screen: NEGATIVE

## 2020-10-02 DIAGNOSIS — I10 Essential (primary) hypertension: Secondary | ICD-10-CM | POA: Diagnosis not present

## 2020-10-02 DIAGNOSIS — M797 Fibromyalgia: Secondary | ICD-10-CM | POA: Diagnosis not present

## 2020-10-02 DIAGNOSIS — G894 Chronic pain syndrome: Secondary | ICD-10-CM | POA: Diagnosis not present

## 2020-10-02 DIAGNOSIS — E119 Type 2 diabetes mellitus without complications: Secondary | ICD-10-CM | POA: Diagnosis not present

## 2020-10-02 DIAGNOSIS — F5104 Psychophysiologic insomnia: Secondary | ICD-10-CM | POA: Diagnosis not present

## 2020-10-09 ENCOUNTER — Other Ambulatory Visit: Payer: Self-pay | Admitting: Podiatry

## 2020-10-09 NOTE — Progress Notes (Signed)
Spoke with Micheal Bell and [name] made aware to arrive at Short Stay at 5:15 AM, reviewed pre op instructions with both and Micheal Bell verbalized understanding. No changes in allergies, medications or medical history since pre op appointment. Micheal Bell denies any shortness of breat or long term effects of COVID.

## 2020-10-10 NOTE — Telephone Encounter (Signed)
Please advise 

## 2020-10-11 ENCOUNTER — Other Ambulatory Visit: Payer: Self-pay | Admitting: Cardiovascular Disease

## 2020-10-17 ENCOUNTER — Ambulatory Visit (HOSPITAL_COMMUNITY): Payer: Medicaid Other

## 2020-10-17 ENCOUNTER — Ambulatory Visit (HOSPITAL_COMMUNITY): Payer: Medicaid Other | Admitting: Certified Registered"

## 2020-10-17 ENCOUNTER — Ambulatory Visit (HOSPITAL_COMMUNITY)
Admission: RE | Admit: 2020-10-17 | Discharge: 2020-10-17 | Disposition: A | Discharge status: Home / Self Care | Payer: Medicaid Other | Attending: Orthopedic Surgery | Admitting: Orthopedic Surgery

## 2020-10-17 ENCOUNTER — Ambulatory Visit (HOSPITAL_COMMUNITY): Payer: Medicaid Other | Admitting: Physician Assistant

## 2020-10-17 ENCOUNTER — Encounter (HOSPITAL_COMMUNITY)
Admission: RE | Disposition: A | Discharge status: Home / Self Care | Payer: Self-pay | Source: Home / Self Care | Attending: Orthopedic Surgery

## 2020-10-17 ENCOUNTER — Encounter (HOSPITAL_COMMUNITY): Payer: Self-pay | Admitting: Orthopedic Surgery

## 2020-10-17 DIAGNOSIS — M75101 Unspecified rotator cuff tear or rupture of right shoulder, not specified as traumatic: Secondary | ICD-10-CM | POA: Diagnosis not present

## 2020-10-17 DIAGNOSIS — I252 Old myocardial infarction: Secondary | ICD-10-CM | POA: Insufficient documentation

## 2020-10-17 DIAGNOSIS — E119 Type 2 diabetes mellitus without complications: Secondary | ICD-10-CM | POA: Insufficient documentation

## 2020-10-17 DIAGNOSIS — Z794 Long term (current) use of insulin: Secondary | ICD-10-CM | POA: Diagnosis not present

## 2020-10-17 DIAGNOSIS — Z955 Presence of coronary angioplasty implant and graft: Secondary | ICD-10-CM | POA: Insufficient documentation

## 2020-10-17 DIAGNOSIS — Z881 Allergy status to other antibiotic agents status: Secondary | ICD-10-CM | POA: Diagnosis not present

## 2020-10-17 DIAGNOSIS — Z79899 Other long term (current) drug therapy: Secondary | ICD-10-CM | POA: Diagnosis not present

## 2020-10-17 DIAGNOSIS — G8918 Other acute postprocedural pain: Secondary | ICD-10-CM | POA: Diagnosis not present

## 2020-10-17 DIAGNOSIS — M19011 Primary osteoarthritis, right shoulder: Secondary | ICD-10-CM | POA: Insufficient documentation

## 2020-10-17 DIAGNOSIS — Z7984 Long term (current) use of oral hypoglycemic drugs: Secondary | ICD-10-CM | POA: Diagnosis not present

## 2020-10-17 DIAGNOSIS — Z96611 Presence of right artificial shoulder joint: Secondary | ICD-10-CM | POA: Diagnosis not present

## 2020-10-17 DIAGNOSIS — Z8673 Personal history of transient ischemic attack (TIA), and cerebral infarction without residual deficits: Secondary | ICD-10-CM | POA: Diagnosis not present

## 2020-10-17 DIAGNOSIS — Z7982 Long term (current) use of aspirin: Secondary | ICD-10-CM | POA: Insufficient documentation

## 2020-10-17 DIAGNOSIS — Z7901 Long term (current) use of anticoagulants: Secondary | ICD-10-CM | POA: Insufficient documentation

## 2020-10-17 DIAGNOSIS — M12811 Other specific arthropathies, not elsewhere classified, right shoulder: Secondary | ICD-10-CM | POA: Diagnosis not present

## 2020-10-17 DIAGNOSIS — E876 Hypokalemia: Secondary | ICD-10-CM | POA: Diagnosis not present

## 2020-10-17 DIAGNOSIS — Z471 Aftercare following joint replacement surgery: Secondary | ICD-10-CM | POA: Diagnosis not present

## 2020-10-17 DIAGNOSIS — Z86718 Personal history of other venous thrombosis and embolism: Secondary | ICD-10-CM | POA: Diagnosis not present

## 2020-10-17 HISTORY — PX: REVERSE SHOULDER ARTHROPLASTY: SHX5054

## 2020-10-17 LAB — CBC WITH DIFFERENTIAL/PLATELET
Abs Immature Granulocytes: 0.02 10*3/uL (ref 0.00–0.07)
Basophils Absolute: 0.1 10*3/uL (ref 0.0–0.1)
Basophils Relative: 1 %
Eosinophils Absolute: 0.3 10*3/uL (ref 0.0–0.5)
Eosinophils Relative: 3 %
HCT: 38.3 % — ABNORMAL LOW (ref 39.0–52.0)
Hemoglobin: 12.9 g/dL — ABNORMAL LOW (ref 13.0–17.0)
Immature Granulocytes: 0 %
Lymphocytes Relative: 31 %
Lymphs Abs: 2.8 10*3/uL (ref 0.7–4.0)
MCH: 31.5 pg (ref 26.0–34.0)
MCHC: 33.7 g/dL (ref 30.0–36.0)
MCV: 93.6 fL (ref 80.0–100.0)
Monocytes Absolute: 0.7 10*3/uL (ref 0.1–1.0)
Monocytes Relative: 7 %
Neutro Abs: 5 10*3/uL (ref 1.7–7.7)
Neutrophils Relative %: 58 %
Platelets: 201 10*3/uL (ref 150–400)
RBC: 4.09 MIL/uL — ABNORMAL LOW (ref 4.22–5.81)
RDW: 13.3 % (ref 11.5–15.5)
WBC: 8.8 10*3/uL (ref 4.0–10.5)
nRBC: 0 % (ref 0.0–0.2)

## 2020-10-17 LAB — COMPREHENSIVE METABOLIC PANEL
ALT: 18 U/L (ref 0–44)
AST: 22 U/L (ref 15–41)
Albumin: 4.1 g/dL (ref 3.5–5.0)
Alkaline Phosphatase: 116 U/L (ref 38–126)
Anion gap: 7 (ref 5–15)
BUN: 12 mg/dL (ref 6–20)
CO2: 27 mmol/L (ref 22–32)
Calcium: 9.3 mg/dL (ref 8.9–10.3)
Chloride: 102 mmol/L (ref 98–111)
Creatinine, Ser: 0.87 mg/dL (ref 0.61–1.24)
GFR, Estimated: >60 mL/min (ref >60)
Glucose, Bld: 161 mg/dL — ABNORMAL HIGH (ref 70–99)
Potassium: 4.1 mmol/L (ref 3.5–5.1)
Sodium: 136 mmol/L (ref 135–145)
Total Bilirubin: 0.4 mg/dL (ref 0.3–1.2)
Total Protein: 6.9 g/dL (ref 6.5–8.1)

## 2020-10-17 LAB — TYPE AND SCREEN
ABO/RH(D): O POS
Antibody Screen: NEGATIVE

## 2020-10-17 LAB — GLUCOSE, CAPILLARY
Glucose-Capillary: 147 mg/dL — ABNORMAL HIGH (ref 70–99)
Glucose-Capillary: 133 mg/dL — ABNORMAL HIGH (ref 70–99)

## 2020-10-17 IMAGING — DX DG SHOULDER 2+V PORT*R*
1 series · 1 of 1 positions shown · non-contrast
Comparison: [DATE]

CLINICAL DATA: Right shoulder replacement

EXAM:
PORTABLE RIGHT SHOULDER

[shoulder ap]
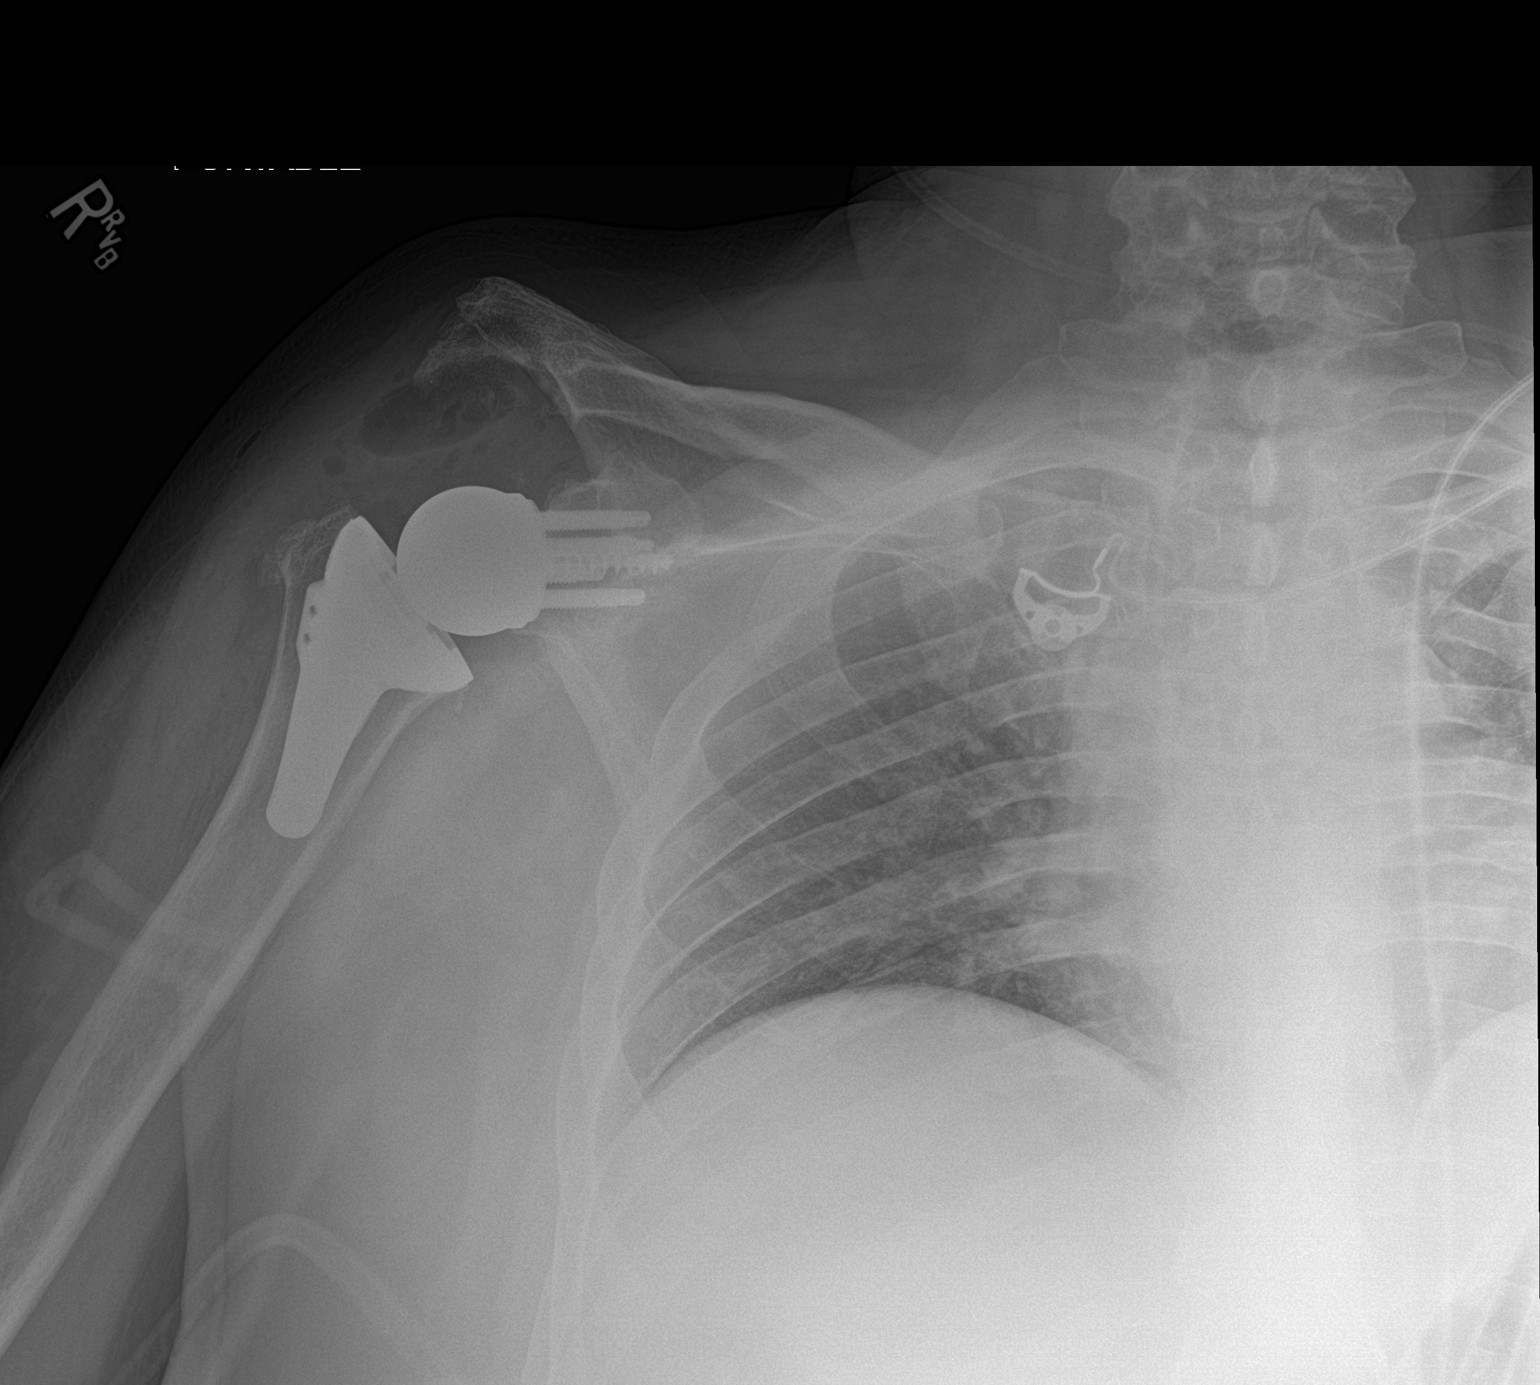

[1 of 1 positions shown; findings below may reference images not displayed]

FINDINGS: Right shoulder replacement in satisfactory position alignment. No
fracture or complication on single view. Acromioplasty. Gas in the
joint.
IMPRESSION: Satisfactory right shoulder replacement.

## 2020-10-17 SURGERY — ARTHROPLASTY, SHOULDER, TOTAL, REVERSE
Anesthesia: General | Site: Shoulder | Laterality: Right

## 2020-10-17 MED ORDER — ONDANSETRON HCL 4 MG PO TABS: 4.0000 mg | ORAL_TABLET | Freq: Four times a day (QID) | ORAL | Status: DC | PRN

## 2020-10-17 MED ORDER — FUROSEMIDE 20 MG PO TABS: 20.0000 mg | ORAL_TABLET | Freq: Every day | ORAL | Status: DC | PRN

## 2020-10-17 MED ORDER — SODIUM CHLORIDE 0.9 % IV SOLN: INTRAVENOUS | Status: DC

## 2020-10-17 MED ORDER — PHENYLEPHRINE HCL-NACL 20-0.9 MG/250ML-% IV SOLN
INTRAVENOUS | Status: DC | PRN
  Administered 2020-10-17: 30 ug/min via INTRAVENOUS

## 2020-10-17 MED ORDER — SPIRONOLACTONE 12.5 MG HALF TABLET: 12.5000 mg | ORAL_TABLET | Freq: Every day | ORAL | Status: DC

## 2020-10-17 MED ORDER — TOBRAMYCIN-DEXAMETHASONE 0.3-0.1 % OP OINT: 1.0000 "application " | TOPICAL_OINTMENT | Freq: Every day | OPHTHALMIC | Status: DC | PRN

## 2020-10-17 MED ORDER — MIDAZOLAM HCL 5 MG/5ML IJ SOLN
INTRAMUSCULAR | Status: DC | PRN
  Administered 2020-10-17: 2 mg via INTRAVENOUS

## 2020-10-17 MED ORDER — NORTRIPTYLINE HCL 25 MG PO CAPS: 25.0000 mg | ORAL_CAPSULE | Freq: Every day | ORAL | Status: DC

## 2020-10-17 MED ORDER — POTASSIUM CHLORIDE CRYS ER 20 MEQ PO TBCR: 20.0000 meq | EXTENDED_RELEASE_TABLET | Freq: Every day | ORAL | Status: DC

## 2020-10-17 MED ORDER — PHENYLEPHRINE 40 MCG/ML (10ML) SYRINGE FOR IV PUSH (FOR BLOOD PRESSURE SUPPORT)
PREFILLED_SYRINGE | INTRAVENOUS | Status: DC | PRN
  Administered 2020-10-17: 40 ug via INTRAVENOUS
  Administered 2020-10-17: 120 ug via INTRAVENOUS
  Administered 2020-10-17: 80 ug via INTRAVENOUS

## 2020-10-17 MED ORDER — MULTI-VITAMIN/MINERALS PO TABS: 1.0000 | ORAL_TABLET | Freq: Every day | ORAL | Status: DC

## 2020-10-17 MED ORDER — ROCURONIUM BROMIDE 10 MG/ML (PF) SYRINGE
PREFILLED_SYRINGE | INTRAVENOUS | Status: AC
  Filled 2020-10-17: qty 10

## 2020-10-17 MED ORDER — OXYCODONE HCL 5 MG PO TABS
10.0000 mg | ORAL_TABLET | ORAL | Status: DC | PRN
  Administered 2020-10-17: 15 mg via ORAL

## 2020-10-17 MED ORDER — METOCLOPRAMIDE HCL 5 MG/ML IJ SOLN: 5.0000 mg | Freq: Three times a day (TID) | INTRAMUSCULAR | Status: DC | PRN

## 2020-10-17 MED ORDER — 0.9 % SODIUM CHLORIDE (POUR BTL) OPTIME
TOPICAL | Status: DC | PRN
  Administered 2020-10-17: 1000 mL

## 2020-10-17 MED ORDER — FLEET ENEMA 7-19 GM/118ML RE ENEM: 1.0000 | ENEMA | Freq: Once | RECTAL | Status: DC | PRN

## 2020-10-17 MED ORDER — WATER FOR IRRIGATION, STERILE IR SOLN
Status: DC | PRN
  Administered 2020-10-17: 2000 mL

## 2020-10-17 MED ORDER — CLOPIDOGREL BISULFATE 75 MG PO TABS: 75.0000 mg | ORAL_TABLET | Freq: Every day | ORAL | Status: DC

## 2020-10-17 MED ORDER — HYDROMORPHONE HCL 4 MG PO TABS: 4.0000 mg | ORAL_TABLET | Freq: Three times a day (TID) | ORAL | 0 refills | Status: AC

## 2020-10-17 MED ORDER — FENTANYL CITRATE (PF) 100 MCG/2ML IJ SOLN
INTRAMUSCULAR | Status: DC | PRN
  Administered 2020-10-17: 50 ug via INTRAVENOUS

## 2020-10-17 MED ORDER — CHLORHEXIDINE GLUCONATE 0.12 % MT SOLN
15.0000 mL | Freq: Once | OROMUCOSAL | Status: AC
  Administered 2020-10-17: 15 mL via OROMUCOSAL

## 2020-10-17 MED ORDER — CARVEDILOL 12.5 MG PO TABS: 12.5000 mg | ORAL_TABLET | Freq: Two times a day (BID) | ORAL | Status: DC

## 2020-10-17 MED ORDER — ALBUMIN HUMAN 5 % IV SOLN
INTRAVENOUS | Status: AC
  Filled 2020-10-17: qty 250

## 2020-10-17 MED ORDER — CYCLOSPORINE 0.05 % OP EMUL: 1.0000 [drp] | Freq: Two times a day (BID) | OPHTHALMIC | Status: DC

## 2020-10-17 MED ORDER — ORAL CARE MOUTH RINSE: 15.0000 mL | Freq: Once | OROMUCOSAL | Status: AC

## 2020-10-17 MED ORDER — PHENOL 1.4 % MT LIQD: 1.0000 | OROMUCOSAL | Status: DC | PRN

## 2020-10-17 MED ORDER — LACTATED RINGERS IV SOLN: INTRAVENOUS | Status: DC

## 2020-10-17 MED ORDER — ALLOPURINOL 300 MG PO TABS: 300.0000 mg | ORAL_TABLET | Freq: Every day | ORAL | Status: DC

## 2020-10-17 MED ORDER — ALBUMIN HUMAN 5 % IV SOLN
12.5000 g | Freq: Once | INTRAVENOUS | Status: AC
  Administered 2020-10-17: 12.5 g via INTRAVENOUS

## 2020-10-17 MED ORDER — ASPIRIN EC 81 MG PO TBEC: 81.0000 mg | DELAYED_RELEASE_TABLET | Freq: Every day | ORAL | Status: DC

## 2020-10-17 MED ORDER — MIDAZOLAM HCL 2 MG/2ML IJ SOLN
INTRAMUSCULAR | Status: AC
  Filled 2020-10-17: qty 2

## 2020-10-17 MED ORDER — EMPAGLIFLOZIN 10 MG PO TABS: 10.0000 mg | ORAL_TABLET | Freq: Every day | ORAL | Status: DC

## 2020-10-17 MED ORDER — SUGAMMADEX SODIUM 200 MG/2ML IV SOLN
INTRAVENOUS | Status: DC | PRN
  Administered 2020-10-17: 250 mg via INTRAVENOUS

## 2020-10-17 MED ORDER — BUPROPION HCL ER (XL) 150 MG PO TB24: 150.0000 mg | ORAL_TABLET | Freq: Every morning | ORAL | Status: DC

## 2020-10-17 MED ORDER — DEXAMETHASONE SODIUM PHOSPHATE 10 MG/ML IJ SOLN
INTRAMUSCULAR | Status: AC
  Filled 2020-10-17: qty 1

## 2020-10-17 MED ORDER — METHOCARBAMOL 500 MG IVPB - SIMPLE MED: 500.0000 mg | Freq: Four times a day (QID) | INTRAVENOUS | Status: DC | PRN

## 2020-10-17 MED ORDER — DOCUSATE SODIUM 100 MG PO CAPS: 100.0000 mg | ORAL_CAPSULE | Freq: Two times a day (BID) | ORAL | Status: DC

## 2020-10-17 MED ORDER — LIDOCAINE 2% (20 MG/ML) 5 ML SYRINGE
INTRAMUSCULAR | Status: AC
  Filled 2020-10-17: qty 5

## 2020-10-17 MED ORDER — HYDROMORPHONE HCL 1 MG/ML IJ SOLN: 0.5000 mg | INTRAMUSCULAR | Status: DC | PRN

## 2020-10-17 MED ORDER — SACUBITRIL-VALSARTAN 24-26 MG PO TABS: 1.0000 | ORAL_TABLET | Freq: Two times a day (BID) | ORAL | Status: DC

## 2020-10-17 MED ORDER — ROSUVASTATIN CALCIUM 20 MG PO TABS: 20.0000 mg | ORAL_TABLET | Freq: Every day | ORAL | Status: DC

## 2020-10-17 MED ORDER — OXYCODONE HCL 5 MG PO TABS: 5.0000 mg | ORAL_TABLET | ORAL | Status: DC | PRN

## 2020-10-17 MED ORDER — ALBUTEROL SULFATE (2.5 MG/3ML) 0.083% IN NEBU: 2.5000 mg | INHALATION_SOLUTION | Freq: Three times a day (TID) | RESPIRATORY_TRACT | Status: DC | PRN

## 2020-10-17 MED ORDER — METHOCARBAMOL 500 MG PO TABS: 500.0000 mg | ORAL_TABLET | Freq: Four times a day (QID) | ORAL | Status: DC | PRN

## 2020-10-17 MED ORDER — METOCLOPRAMIDE HCL 5 MG PO TABS: 5.0000 mg | ORAL_TABLET | Freq: Three times a day (TID) | ORAL | Status: DC | PRN

## 2020-10-17 MED ORDER — ACETAMINOPHEN 325 MG PO TABS: 325.0000 mg | ORAL_TABLET | Freq: Four times a day (QID) | ORAL | Status: DC | PRN

## 2020-10-17 MED ORDER — LIDOCAINE 2% (20 MG/ML) 5 ML SYRINGE
INTRAMUSCULAR | Status: DC | PRN
  Administered 2020-10-17: 40 mg via INTRAVENOUS

## 2020-10-17 MED ORDER — LOSARTAN POTASSIUM 25 MG PO TABS: 25.0000 mg | ORAL_TABLET | Freq: Every day | ORAL | Status: DC

## 2020-10-17 MED ORDER — APIXABAN 5 MG PO TABS: 5.0000 mg | ORAL_TABLET | Freq: Two times a day (BID) | ORAL | Status: DC

## 2020-10-17 MED ORDER — ISOSORBIDE MONONITRATE ER 30 MG PO TB24: 30.0000 mg | ORAL_TABLET | Freq: Every day | ORAL | Status: DC

## 2020-10-17 MED ORDER — BISACODYL 5 MG PO TBEC: 5.0000 mg | DELAYED_RELEASE_TABLET | Freq: Every day | ORAL | Status: DC | PRN

## 2020-10-17 MED ORDER — EPHEDRINE SULFATE-NACL 50-0.9 MG/10ML-% IV SOSY
PREFILLED_SYRINGE | INTRAVENOUS | Status: DC | PRN
  Administered 2020-10-17: 10 mg via INTRAVENOUS
  Administered 2020-10-17 (×3): 5 mg via INTRAVENOUS

## 2020-10-17 MED ORDER — PROPOFOL 10 MG/ML IV BOLUS
INTRAVENOUS | Status: DC | PRN
  Administered 2020-10-17: 120 mg via INTRAVENOUS

## 2020-10-17 MED ORDER — TRANEXAMIC ACID-NACL 1000-0.7 MG/100ML-% IV SOLN
1000.0000 mg | INTRAVENOUS | Status: AC
  Administered 2020-10-17: 1000 mg via INTRAVENOUS
  Filled 2020-10-17: qty 100

## 2020-10-17 MED ORDER — GABAPENTIN 300 MG PO CAPS: 1200.0000 mg | ORAL_CAPSULE | Freq: Two times a day (BID) | ORAL | Status: DC

## 2020-10-17 MED ORDER — FLUTICASONE PROPIONATE 50 MCG/ACT NA SUSP: 1.0000 | Freq: Every day | NASAL | Status: DC | PRN

## 2020-10-17 MED ORDER — PHENYLEPHRINE HCL (PRESSORS) 10 MG/ML IV SOLN
INTRAVENOUS | Status: AC
  Filled 2020-10-17: qty 2

## 2020-10-17 MED ORDER — ROCURONIUM BROMIDE 10 MG/ML (PF) SYRINGE
PREFILLED_SYRINGE | INTRAVENOUS | Status: DC | PRN
  Administered 2020-10-17: 50 mg via INTRAVENOUS

## 2020-10-17 MED ORDER — ZOLPIDEM TARTRATE 10 MG PO TABS: 10.0000 mg | ORAL_TABLET | Freq: Every day | ORAL | Status: DC

## 2020-10-17 MED ORDER — MICONAZOLE NITRATE 2 % EX CREA: TOPICAL_CREAM | CUTANEOUS | 1 refills | Status: AC

## 2020-10-17 MED ORDER — PANTOPRAZOLE SODIUM 40 MG PO TBEC: 40.0000 mg | DELAYED_RELEASE_TABLET | Freq: Every day | ORAL | Status: DC

## 2020-10-17 MED ORDER — ALBUTEROL SULFATE HFA 108 (90 BASE) MCG/ACT IN AERS: 1.0000 | INHALATION_SPRAY | RESPIRATORY_TRACT | Status: DC | PRN

## 2020-10-17 MED ORDER — ONDANSETRON HCL 4 MG/2ML IJ SOLN
INTRAMUSCULAR | Status: AC
  Filled 2020-10-17: qty 2

## 2020-10-17 MED ORDER — DIPHENHYDRAMINE HCL 12.5 MG/5ML PO ELIX: 12.5000 mg | ORAL_SOLUTION | ORAL | Status: DC | PRN

## 2020-10-17 MED ORDER — LEVETIRACETAM 500 MG PO TABS: 500.0000 mg | ORAL_TABLET | Freq: Two times a day (BID) | ORAL | Status: DC

## 2020-10-17 MED ORDER — ALUM & MAG HYDROXIDE-SIMETH 200-200-20 MG/5ML PO SUSP: 30.0000 mL | ORAL | Status: DC | PRN

## 2020-10-17 MED ORDER — ONDANSETRON HCL 4 MG/2ML IJ SOLN
INTRAMUSCULAR | Status: DC | PRN
  Administered 2020-10-17: 4 mg via INTRAVENOUS

## 2020-10-17 MED ORDER — NITROGLYCERIN 0.4 MG SL SUBL: 0.4000 mg | SUBLINGUAL_TABLET | SUBLINGUAL | Status: DC | PRN

## 2020-10-17 MED ORDER — PROPOFOL 10 MG/ML IV BOLUS
INTRAVENOUS | Status: AC
  Filled 2020-10-17: qty 40

## 2020-10-17 MED ORDER — CEFAZOLIN SODIUM-DEXTROSE 2-4 GM/100ML-% IV SOLN
2.0000 g | INTRAVENOUS | Status: AC
  Administered 2020-10-17: 2 g via INTRAVENOUS
  Filled 2020-10-17: qty 100

## 2020-10-17 MED ORDER — MENTHOL 3 MG MT LOZG: 1.0000 | LOZENGE | OROMUCOSAL | Status: DC | PRN

## 2020-10-17 MED ORDER — INSULIN GLARGINE 100 UNIT/ML SOLOSTAR PEN: 30.0000 [IU] | PEN_INJECTOR | Freq: Every evening | SUBCUTANEOUS | Status: DC

## 2020-10-17 MED ORDER — SODIUM CHLORIDE 0.9 % IR SOLN
Status: DC | PRN
  Administered 2020-10-17: 1000 mL

## 2020-10-17 MED ORDER — METOPROLOL TARTRATE 50 MG PO TABS: 75.0000 mg | ORAL_TABLET | Freq: Two times a day (BID) | ORAL | Status: DC

## 2020-10-17 MED ORDER — BUPIVACAINE HCL (PF) 0.5 % IJ SOLN
INTRAMUSCULAR | Status: DC | PRN
  Administered 2020-10-17: 15 mL via PERINEURAL

## 2020-10-17 MED ORDER — FENTANYL CITRATE (PF) 250 MCG/5ML IJ SOLN
INTRAMUSCULAR | Status: AC
  Filled 2020-10-17: qty 5

## 2020-10-17 MED ORDER — ONDANSETRON HCL 4 MG/2ML IJ SOLN: 4.0000 mg | Freq: Four times a day (QID) | INTRAMUSCULAR | Status: DC | PRN

## 2020-10-17 MED ORDER — OXYCODONE HCL 5 MG PO TABS
ORAL_TABLET | ORAL | Status: AC
  Filled 2020-10-17: qty 3

## 2020-10-17 MED ORDER — MONTELUKAST SODIUM 10 MG PO TABS: 10.0000 mg | ORAL_TABLET | Freq: Every day | ORAL | Status: DC

## 2020-10-17 MED ORDER — SUGAMMADEX SODIUM 200 MG/2ML IV SOLN: INTRAVENOUS | Status: DC | PRN

## 2020-10-17 MED ORDER — ACETAMINOPHEN 500 MG PO TABS: 1000.0000 mg | ORAL_TABLET | Freq: Four times a day (QID) | ORAL | Status: DC

## 2020-10-17 MED ORDER — POLYETHYLENE GLYCOL 3350 17 G PO PACK: 17.0000 g | PACK | Freq: Every day | ORAL | Status: DC | PRN

## 2020-10-17 MED ORDER — MOMETASONE FURO-FORMOTEROL FUM 200-5 MCG/ACT IN AERO: 1.0000 | INHALATION_SPRAY | Freq: Two times a day (BID) | RESPIRATORY_TRACT | Status: DC

## 2020-10-17 MED ORDER — BUPIVACAINE LIPOSOME 1.3 % IJ SUSP
INTRAMUSCULAR | Status: DC | PRN
  Administered 2020-10-17: 10 mL via PERINEURAL

## 2020-10-17 SURGICAL SUPPLY — 75 items
BAG COUNTER SPONGE SURGICOUNT (BAG) ×2 IMPLANT
BAG ZIPLOCK 12X15 (MISCELLANEOUS) IMPLANT
BASEPLATE P2 COATD GLND 6.5X30 (Shoulder) ×1 IMPLANT
BIT DRILL 1.6MX128 (BIT) IMPLANT
BIT DRILL 2.5 DIA 127 CALI (BIT) ×2 IMPLANT
BIT DRILL 4 DIA CALIBRATED (BIT) ×2 IMPLANT
BLADE SAW SAG 73X25 THK (BLADE) ×1
BLADE SAW SGTL 73X25 THK (BLADE) ×1 IMPLANT
BOOTIES KNEE HIGH SLOAN (MISCELLANEOUS) ×4 IMPLANT
COOLER ICEMAN CLASSIC (MISCELLANEOUS) ×2 IMPLANT
COVER BACK TABLE 60X90IN (DRAPES) ×2 IMPLANT
COVER SURGICAL LIGHT HANDLE (MISCELLANEOUS) ×2 IMPLANT
DRAPE INCISE IOBAN 66X45 STRL (DRAPES) ×2 IMPLANT
DRAPE ORTHO SPLIT 77X108 STRL (DRAPES) ×2
DRAPE POUCH INSTRU U-SHP 10X18 (DRAPES) ×2 IMPLANT
DRAPE SHEET LG 3/4 BI-LAMINATE (DRAPES) ×2 IMPLANT
DRAPE SURG 17X11 SM STRL (DRAPES) ×2 IMPLANT
DRAPE SURG ORHT 6 SPLT 77X108 (DRAPES) ×2 IMPLANT
DRAPE TOP 10253 STERILE (DRAPES) ×2 IMPLANT
DRAPE U-SHAPE 47X51 STRL (DRAPES) ×2 IMPLANT
DRSG AQUACEL AG ADV 3.5X 6 (GAUZE/BANDAGES/DRESSINGS) IMPLANT
DRSG AQUACEL AG ADV 3.5X10 (GAUZE/BANDAGES/DRESSINGS) ×2 IMPLANT
DURAPREP 26ML APPLICATOR (WOUND CARE) ×4 IMPLANT
ELECT BLADE TIP CTD 4 INCH (ELECTRODE) ×2 IMPLANT
ELECT REM PT RETURN 15FT ADLT (MISCELLANEOUS) ×2 IMPLANT
GLOVE SRG 8 PF TXTR STRL LF DI (GLOVE) ×1 IMPLANT
GLOVE SURG ENC MOIS LTX SZ6.5 (GLOVE) ×2 IMPLANT
GLOVE SURG ENC MOIS LTX SZ7.5 (GLOVE) ×2 IMPLANT
GLOVE SURG UNDER POLY LF SZ6.5 (GLOVE) ×2 IMPLANT
GLOVE SURG UNDER POLY LF SZ8 (GLOVE) ×1
GOWN STRL REUS W/TWL LRG LVL3 (GOWN DISPOSABLE) ×2 IMPLANT
GOWN STRL REUS W/TWL XL LVL3 (GOWN DISPOSABLE) ×2 IMPLANT
HANDPIECE INTERPULSE COAX TIP (DISPOSABLE) ×1
HOOD PEEL AWAY FLYTE STAYCOOL (MISCELLANEOUS) ×6 IMPLANT
INSERT EPOLY STND HUM SKT+4 32 (Shoulder) ×2 IMPLANT
INSERT EPOLYSTD HUM SKT+4 32 (Shoulder) ×1 IMPLANT
KIT BASIN OR (CUSTOM PROCEDURE TRAY) ×2 IMPLANT
KIT TURNOVER KIT A (KITS) ×2 IMPLANT
MANIFOLD NEPTUNE II (INSTRUMENTS) ×2 IMPLANT
NEEDLE TROCAR POINT SZ 2 1/2 (NEEDLE) IMPLANT
NS IRRIG 1000ML POUR BTL (IV SOLUTION) ×2 IMPLANT
P2 COATDE GLNOID BSEPLT 6.5X30 (Shoulder) ×2 IMPLANT
PACK SHOULDER (CUSTOM PROCEDURE TRAY) ×2 IMPLANT
PAD COLD SHLDR WRAP-ON (PAD) ×2 IMPLANT
PROTECTOR NERVE ULNAR (MISCELLANEOUS) IMPLANT
RESTRAINT HEAD UNIVERSAL NS (MISCELLANEOUS) ×2 IMPLANT
RETRIEVER SUT HEWSON (MISCELLANEOUS) IMPLANT
SCREW BONE LOCKING RSP 5.0X14 (Screw) ×2 IMPLANT
SCREW BONE LOCKING RSP 5.0X30 (Screw) ×2 IMPLANT
SCREW BONE RSP LOCK 5X14 (Screw) ×1 IMPLANT
SCREW BONE RSP LOCK 5X26 (Screw) ×2 IMPLANT
SCREW BONE RSP LOCK 5X30 (Screw) ×1 IMPLANT
SCREW BONE RSP LOCKING 5.0X26 (Screw) ×4 IMPLANT
SCREW RETAIN W/HEAD 32MM (Shoulder) ×2 IMPLANT
SET HNDPC FAN SPRY TIP SCT (DISPOSABLE) ×1 IMPLANT
SLING ARM IMMOBILIZER LRG (SOFTGOODS) ×2 IMPLANT
SLING ARM IMMOBILIZER MED (SOFTGOODS) IMPLANT
SPONGE T-LAP 18X18 ~~LOC~~+RFID (SPONGE) ×2 IMPLANT
SPONGE T-LAP 4X18 ~~LOC~~+RFID (SPONGE) ×2 IMPLANT
STEM HUMERAL 12X48 STD SHORT (Shoulder) ×2 IMPLANT
STRIP CLOSURE SKIN 1/2X4 (GAUZE/BANDAGES/DRESSINGS) ×2 IMPLANT
SUCTION FRAZIER HANDLE 10FR (MISCELLANEOUS)
SUCTION TUBE FRAZIER 10FR DISP (MISCELLANEOUS) IMPLANT
SUPPORT WRAP ARM LG (MISCELLANEOUS) IMPLANT
SUT ETHIBOND 2 V 37 (SUTURE) IMPLANT
SUT FIBERWIRE #2 38 REV NDL BL (SUTURE)
SUT MNCRL AB 4-0 PS2 18 (SUTURE) ×2 IMPLANT
SUT VIC AB 2-0 CT1 27 (SUTURE) ×2
SUT VIC AB 2-0 CT1 TAPERPNT 27 (SUTURE) ×2 IMPLANT
SUTURE FIBERWR#2 38 REV NDL BL (SUTURE) IMPLANT
TAPE LABRALWHITE 1.5X36 (TAPE) IMPLANT
TAPE SUT LABRALTAP WHT/BLK (SUTURE) IMPLANT
TOWEL OR 17X26 10 PK STRL BLUE (TOWEL DISPOSABLE) ×2 IMPLANT
TOWEL OR NON WOVEN STRL DISP B (DISPOSABLE) ×2 IMPLANT
WATER STERILE IRR 1000ML POUR (IV SOLUTION) ×4 IMPLANT

## 2020-10-17 NOTE — Transfer of Care (Signed)
Immediate Anesthesia Transfer of Care Note  Patient: Micheal Bell  Procedure(s) Performed: REVERSE SHOULDER ARTHROPLASTY (Right: Shoulder)  Patient Location: PACU  Anesthesia Type:GA combined with regional for post-op pain  Level of Consciousness: awake, alert , oriented and patient cooperative  Airway & Oxygen Therapy: Patient Spontanous Breathing and Patient connected to face mask oxygen  Post-op Assessment: Report given to RN and Post -op Vital signs reviewed and stable  Post vital signs: Reviewed and stable  Last Vitals:  Vitals Value Taken Time  BP 99/57 10/17/20 0918  Temp    Pulse 85 10/17/20 0921  Resp 14 10/17/20 0921  SpO2 100 % 10/17/20 0921  Vitals shown include unvalidated device data.  Last Pain:  Vitals:   10/17/20 0555  TempSrc: Oral  PainSc:       Patients Stated Pain Goal: 5 (10/17/20 0543)  Complications: No notable events documented.

## 2020-10-17 NOTE — Discharge Instructions (Addendum)
Discharge Instructions after Reverse Total Shoulder Arthroplasty   A sling has been provided for you. You are to wear this at all times (except for bathing and dressing), until your first post operative visit with Dr. Ave Filter. Please also wear while sleeping at night. While you bath and dress, let the arm/elbow extend straight down to stretch your elbow. Wiggle your fingers and pump your first while your in the sling to prevent hand swelling. Use ice on the shoulder intermittently over the first 48 hours after surgery. Continue to use ice or and ice machine as needed after 48 hours for pain control/swelling.  Continue your pain medication (Dilaudid) as prescribed by your pain management doctor. If you need an adjustment of pain medicine, please call the doctor who prescribed the dilaudid. You may take Extra Strength Tylenol or Tylenol only in place of the pain pills. DO NOT take ANY nonsteroidal anti-inflammatory pain medications: Advil, Motrin, Ibuprofen, Aleve, Naproxen or Naprosyn.  Take one aspirin a day for 2 weeks after surgery, unless you have an aspirin sensitivity/allergy or asthma.  Leave your dressing on until your first follow up visit.  You may shower with the dressing.  Hold your arm as if you still have your sling on while you shower. Simply allow the water to wash over the site and then pat dry. Make sure your axilla (armpit) is completely dry after showering.    Please call [phone] during normal business hours or [phone] after hours for any problems. Including the following:  - excessive redness of the incisions - drainage for more than 4 days - fever of more than 101.5 F  *Please note that pain medications will not be refilled after hours or on weekends.

## 2020-10-17 NOTE — Anesthesia Procedure Notes (Signed)
Anesthesia Regional Block: Interscalene brachial plexus block   Pre-Anesthetic Checklist: , timeout performed,  Correct Patient, Correct Site, Correct Laterality,  Correct Procedure, Correct Position, site marked,  Risks and benefits discussed,  Surgical consent,  Pre-op evaluation,  At surgeon's request and post-op pain management  Laterality: Right  Prep: chloraprep       Needles:  Injection technique: Single-shot  Needle Type: Echogenic Needle     Needle Length: 5cm  Needle Gauge: 21     Additional Needles:   Narrative:  Start time: 10/17/2020 7:25 AM End time: 10/17/2020 7:28 AM Injection made incrementally with aspirations every 5 mL.  Performed by: Personally  Anesthesiologist: Beryle Lathe, MD  Additional Notes: No pain on injection. No increased resistance to injection. Injection made in 5cc increments. Good needle visualization. Patient tolerated the procedure well.

## 2020-10-17 NOTE — H&P (Signed)
Micheal Bell is an 50 y.o. male.   Chief Complaint: R shoulder pain  HPI: R shoulder pain and dysfunction with rotator cuff tear arthropathy, failed conservative treatment.  Past Medical History:  Diagnosis Date   Anxiety    Arthritis    Asthma    inhaler daily   CHF (congestive heart failure) (HCC)    Coronary artery disease    Depression    Diabetes mellitus without complication (HCC)    DVT (deep venous thrombosis) (HCC)    Dyspnea    Fibromyalgia    Gout    History of kidney stones    Hypertension    IBS (irritable bowel syndrome)    Myocardial infarction (HCC) 09/2019   Pneumonia 09/2019   Stroke (HCC) 09/2019   Vein disorder     Past Surgical History:  Procedure Laterality Date   CORONARY ATHERECTOMY N/A 02/20/2020   Procedure: CORONARY ATHERECTOMY;  Surgeon: [name], [name], MD;  Location: [facility] INVASIVE CV LAB;  Service: Cardiovascular;  Laterality: N/A;  CSI LAD   CORONARY STENT INTERVENTION N/A 02/20/2020   Procedure: CORONARY STENT INTERVENTION;  Surgeon: [name], [name], MD;  Location: [facility] INVASIVE CV LAB;  Service: Cardiovascular;  Laterality: N/A;  LAD CFX   FOOT SURGERY     INTRAVASCULAR ULTRASOUND/IVUS N/A 02/20/2020   Procedure: Intravascular Ultrasound/IVUS;  Surgeon: [name], [name], MD;  Location: [facility] INVASIVE CV LAB;  Service: Cardiovascular;  Laterality: N/A;   LEFT HEART CATH AND CORONARY ANGIOGRAPHY N/A 02/12/2020   Procedure: LEFT HEART CATH AND CORONARY ANGIOGRAPHY;  Surgeon: [name], MD;  Location: [facility] INVASIVE CV LAB;  Service: Cardiovascular;  Laterality: N/A;   TEE WITHOUT CARDIOVERSION N/A 10/24/2019   Procedure: TRANSESOPHAGEAL ECHOCARDIOGRAM (TEE);  Surgeon: [name], MD;  Location: [facility] ENDOSCOPY;  Service: Cardiovascular;  Laterality: N/A;    Family History  Problem Relation Age of Onset   Diabetes Mother    Heart disease Mother    Heart disease Father    Social History:  reports that he has never smoked. His smokeless  tobacco use includes snuff. He reports that he does not drink alcohol and does not use drugs.  Allergies:  Allergies  Allergen Reactions   Azithromycin     Blisters in mouth    Lodine [Etodolac] Anaphylaxis    Medications Prior to Admission  Medication Sig Dispense Refill   albuterol (PROVENTIL HFA;VENTOLIN HFA) 108 (90 Base) MCG/ACT inhaler Inhale 1-2 puffs into the lungs every 4 (four) hours as needed for wheezing or shortness of breath.     allopurinol (ZYLOPRIM) 300 MG tablet Take 300 mg by mouth daily.     apixaban (ELIQUIS) 5 MG TABS tablet Take 5 mg by mouth 2 (two) times daily.     aspirin EC 81 MG tablet Take 1 tablet (81 mg total) by mouth daily. Swallow whole. 30 tablet 11   buPROPion (WELLBUTRIN XL) 150 MG 24 hr tablet Take 150 mg by mouth every morning.     carvedilol (COREG) 12.5 MG tablet Take 12.5 mg by mouth 2 (two) times daily with a meal.     clopidogrel (PLAVIX) 75 MG tablet Take 1 tablet (75 mg total) by mouth daily. 90 tablet 3   cyclobenzaprine (FLEXERIL) 10 MG tablet Take 0.5 tablets (5 mg total) by mouth 2 (two) times daily as needed for muscle spasms. (Patient taking differently: Take 10 mg by mouth 3 (three) times daily.) 15 tablet 0   empagliflozin (JARDIANCE) 10 MG TABS tablet Take  1 tablet (10 mg total) by mouth daily before breakfast. 90 tablet 3   ENTRESTO 24-26 MG TAKE ONE TABLET BY MOUTH TWICE DAILY (Patient taking differently: Take 1 tablet by mouth 2 (two) times daily.) 60 tablet 1   furosemide (LASIX) 20 MG tablet Take 1 tablet (20 mg total) by mouth daily as needed for fluid or edema (weight gain of 3 lbs or more in a day or 5 lbs in a week). 30 tablet 3   gabapentin (NEURONTIN) 300 MG capsule Take 1,200 mg by mouth 2 (two) times daily.     HYDROmorphone (DILAUDID) 4 MG tablet Take 4 mg by mouth in the morning, at noon, and at bedtime.     insulin glargine (LANTUS) 100 UNIT/ML Solostar Pen Inject 20 Units into the skin daily. (Patient taking differently:  Inject 30 Units into the skin every evening.) 15 mL 0   isosorbide mononitrate (IMDUR) 30 MG 24 hr tablet Take 30 mg by mouth daily.     levETIRAcetam (KEPPRA) 500 MG tablet Take 500 mg by mouth 2 (two) times daily.     lidocaine (XYLOCAINE) 5 % ointment APPLY TO AFFECTED AREA TOPICALLY AS NEEDED 35.44 g 3   losartan (COZAAR) 25 MG tablet Take 25 mg by mouth daily.     meloxicam (MOBIC) 15 MG tablet Take 15 mg by mouth daily.     metoprolol tartrate (LOPRESSOR) 50 MG tablet Take 75 mg by mouth 2 (two) times daily.     mometasone-formoterol (DULERA) 200-5 MCG/ACT AERO Inhale 1 puff into the lungs 2 (two) times daily.     montelukast (SINGULAIR) 10 MG tablet Take 10 mg by mouth at bedtime.     Multiple Vitamins-Minerals (MULTIVITAMIN WITH MINERALS) tablet Take 1 tablet by mouth daily.     nitroGLYCERIN (NITROSTAT) 0.4 MG SL tablet Place 0.4 mg under the tongue every 5 (five) minutes as needed for chest pain.     nortriptyline (PAMELOR) 25 MG capsule Take 25 mg by mouth at bedtime.     pantoprazole (PROTONIX) 40 MG tablet Take 40 mg by mouth daily.     potassium chloride SA (KLOR-CON) 20 MEQ tablet Take 20 mEq by mouth daily.     RESTASIS 0.05 % ophthalmic emulsion Place 1 drop into both eyes 2 (two) times daily.     rosuvastatin (CRESTOR) 20 MG tablet TAKE ONE TABLET BY MOUTH EVERY DAY (Patient taking differently: Take 20 mg by mouth daily.) 90 tablet 3   spironolactone (ALDACTONE) 25 MG tablet Take 0.5 tablets (12.5 mg total) by mouth every other day. (Patient taking differently: Take 12.5 mg by mouth daily.) 60 tablet 1   tobramycin-dexamethasone (TOBRADEX) ophthalmic ointment Place 1 application into both eyes daily as needed (eye infection).     triamcinolone (KENALOG) 0.1 % Apply 1 application topically 2 (two) times daily as needed (irritation).     zolpidem (AMBIEN) 10 MG tablet Take 10 mg by mouth at bedtime.     ACCU-CHEK GUIDE test strip Use to check blood glucose twice daily      Accu-Chek Softclix Lancets lancets 2 (two) times daily.     albuterol (PROVENTIL) (2.5 MG/3ML) 0.083% nebulizer solution Take 2.5 mg by nebulization 3 (three) times daily as needed for wheezing or shortness of breath.     fluticasone (FLONASE) 50 MCG/ACT nasal spray Place 1 spray into both nostrils daily as needed for allergies.     PENTIPS 32G X 4 MM MISC See admin instructions. with insulin  Results for orders placed or performed during the hospital encounter of 10/17/20 (from the past 48 hour(s))  Glucose, capillary     Status: Abnormal   Collection Time: 10/17/20  5:51 AM  Result Value Ref Range   Glucose-Capillary 147 (H) 70 - 99 mg/dL    Comment: Glucose reference range applies only to samples taken after fasting for at least 8 hours.   Comment 1 Notify RN    Comment 2 Document in Chart   Type and screen [facility]     Status: None   Collection Time: 10/17/20  5:52 AM  Result Value Ref Range   ABO/RH(D) O POS    Antibody Screen NEG    Sample Expiration      10/20/2020,2359 Performed at [facility], [address], [place], [place] [address]   CBC with Differential/Platelet     Status: Abnormal   Collection Time: 10/17/20  5:52 AM  Result Value Ref Range   WBC 8.8 4.0 - 10.5 K/uL   RBC 4.09 (L) 4.22 - 5.81 MIL/uL   Hemoglobin 12.9 (L) 13.0 - 17.0 g/dL   HCT 23.3 (L) 00.7 - 62.2 %   MCV 93.6 80.0 - 100.0 fL   MCH 31.5 26.0 - 34.0 pg   MCHC 33.7 30.0 - 36.0 g/dL   RDW 63.3 35.4 - 56.2 %   Platelets 201 150 - 400 K/uL   nRBC 0.0 0.0 - 0.2 %   Neutrophils Relative % 58 %   Neutro Abs 5.0 1.7 - 7.7 K/uL   Lymphocytes Relative 31 %   Lymphs Abs 2.8 0.7 - 4.0 K/uL   Monocytes Relative 7 %   Monocytes Absolute 0.7 0.1 - 1.0 K/uL   Eosinophils Relative 3 %   Eosinophils Absolute 0.3 0.0 - 0.5 K/uL   Basophils Relative 1 %   Basophils Absolute 0.1 0.0 - 0.1 K/uL   Immature Granulocytes 0 %   Abs Immature Granulocytes 0.02 0.00 -  0.07 K/uL    Comment: Performed at [facility], [address], [place], [place] [address]  Comprehensive metabolic panel     Status: Abnormal   Collection Time: 10/17/20  5:52 AM  Result Value Ref Range   Sodium 136 135 - 145 mmol/L   Potassium 4.1 3.5 - 5.1 mmol/L   Chloride 102 98 - 111 mmol/L   CO2 27 22 - 32 mmol/L   Glucose, Bld 161 (H) 70 - 99 mg/dL    Comment: Glucose reference range applies only to samples taken after fasting for at least 8 hours.   BUN 12 6 - 20 mg/dL   Creatinine, Ser 3.73 0.61 - 1.24 mg/dL   Calcium 9.3 8.9 - 42.8 mg/dL   Total Protein 6.9 6.5 - 8.1 g/dL   Albumin 4.1 3.5 - 5.0 g/dL   AST 22 15 - 41 U/L   ALT 18 0 - 44 U/L   Alkaline Phosphatase 116 38 - 126 U/L   Total Bilirubin 0.4 0.3 - 1.2 mg/dL   GFR, Estimated >76 >81 mL/min    Comment: (NOTE) Calculated using the CKD-EPI Creatinine Equation (2021)    Anion gap 7 5 - 15    Comment: Performed at [facility], [address], [place], [place] [address]   No results found.  Review of Systems  All other systems reviewed and are negative.  Blood pressure 131/86, pulse 93, temperature 98.1 F (36.7 C), temperature source Oral, resp. rate 17,  height 6\' 2"  (1.88 m), weight 114.3 kg, SpO2 100 %. Physical Exam HENT:     Head: Atraumatic.  Eyes:     Extraocular Movements: Extraocular movements intact.  Pulmonary:     Effort: Pulmonary effort is normal.  Musculoskeletal:     Comments: R UE pain with any ROM.  Neurological:     Mental Status: He is alert.     Assessment/Plan R shoulder pain and dysfunction with rotator cuff tear arthropathy, failed conservative treatment. Plan R reverse TSA Risks / benefits of surgery discussed Consent on chart  NPO for OR Preop antibiotics   , MD 10/17/2020, 7:13 AM

## 2020-10-17 NOTE — Anesthesia Procedure Notes (Signed)
Procedure Name: Intubation Date/Time: 10/17/2020 7:42 AM Performed by: Cleda Daub, CRNA Pre-anesthesia Checklist: Patient identified, Emergency Drugs available, Suction available and Patient being monitored Patient Re-evaluated:Patient Re-evaluated prior to induction Oxygen Delivery Method: Circle system utilized Preoxygenation: Pre-oxygenation with 100% oxygen Induction Type: IV induction Ventilation: Mask ventilation without difficulty and Oral airway inserted - appropriate to patient size Laryngoscope Size: Mac and 4 Grade View: Grade I Tube type: Oral Tube size: 7.5 mm Number of attempts: 1 Airway Equipment and Method: Stylet and Oral airway Placement Confirmation: ETT inserted through vocal cords under direct vision, positive ETCO2 and breath sounds checked- equal and bilateral Secured at: 23 cm Tube secured with: Tape Dental Injury: Teeth and Oropharynx as per pre-operative assessment

## 2020-10-17 NOTE — Op Note (Signed)
Procedure(s): REVERSE SHOULDER ARTHROPLASTY Procedure Note  Micheal Bell male 50 y.o. 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 male  With endstage right shoulder arthritis with irrepairable rotator cuff tear. Pain and dysfunction interfered with quality of life and nonoperative treatment with activity modification, NSAIDS and injections failed.     Surgeon: Glennon Hamilton   Assistants: Fredia Sorrow PA-C Amber was present and scrubbed throughout the procedure and was essential in positioning, retraction, exposure, and closure)  Anesthesia: General endotracheal anesthesia with preoperative interscalene block given by the attending anesthesiologist    Procedure Detail  REVERSE SHOULDER ARTHROPLASTY   Estimated Blood Loss:  200 mL         Drains: none  Blood Given: none          Specimens: none        Complications:  * No complications entered in OR log *         Disposition: PACU - hemodynamically stable.         Condition: stable      OPERATIVE FINDINGS:  A DJO Altivate pressfit reverse total shoulder arthroplasty was placed with a  size 12 stem, a 32 standard glenosphere, and a +4 semiconstrained-mm poly insert. The base plate  fixation was very good.  PROCEDURE: The patient was identified in the preoperative holding area  where I personally marked the operative site after verifying site, side,  and procedure with the patient. An interscalene block given by  the attending anesthesiologist in the holding area and the patient was taken back to the operating room where all extremities were  carefully padded in position after general anesthesia was induced. She  was placed in a beach-chair position and the operative upper extremity was  prepped and draped in a standard sterile  fashion. An approximately 10-  cm incision was made from the tip of the coracoid process to the center  point of the humerus at the level of the axilla. Dissection was carried  down through subcutaneous tissues to the level of the cephalic vein  which was taken laterally with the deltoid. The pectoralis major was  retracted medially. The subdeltoid space was developed and the lateral  edge of the conjoined tendon was identified. The undersurface of  conjoined tendon was palpated and the musculocutaneous nerve was not in  the field. Retractor was placed underneath the conjoined and second  retractor was placed lateral into the deltoid. The circumflex humeral  artery and vessels were identified and clamped and coagulated. The  biceps tendon was tenotomized.  The subscapularis was absent.  The  joint was then gently externally rotated while the capsule was released  from the humeral neck around to just beyond the 6 o'clock position. At  this point, the joint was dislocated and the humeral head was presented  into the wound. The excessive osteophyte formation was removed with a  large rongeur.  The cutting guide was used to make the appropriate  head cut and the head was saved for potentially bone grafting.  The glenoid was exposed with the arm in an  abducted extended position. The anterior and posterior labrum were  completely excised and the capsule was released circumferentially to  allow for exposure of the glenoid for preparation.  The glenoid was noted to be severely worn superiorly with some  anterior wear and retroversion.  The 2.5 mm drill was  placed using the guide with some correction of deformity and the tap was then advanced in the same hole. Small and large reamers were then used. The tap was then removed and the Metaglene was then screwed in with very good purchase.  The peripheral guide was then used to drilled measured and filled peripheral locking screws. The size 32 standard  glenosphere was then impacted on the [facility] taper and the central screw was placed. The humerus was then again exposed and the diaphyseal reamers were used followed by the metaphyseal reamers. The final broach was left in place in the proximal trial was placed. The joint was reduced and with this implant it was felt that soft tissue tensioning was appropriate with excellent stability and excellent range of motion. Therefore, final humeral stem was placed press-fit .  And then the trial polyethylene inserts were tested again and the above implant was felt to be the most appropriate for final insertion. The joint was reduced taken through full range of motion and felt to be stable. Soft tissue tension was appropriate.  The joint was then copiously irrigated with pulse  lavage and the wound was then closed. The subscapularis was not repaired.  Skin was closed with 2-0 Vicryl in a deep dermal layer and 4-0  Monocryl for skin closure. Steri-Strips were applied. Sterile  dressings were then applied as well as a sling. The patient was allowed  to awaken from general anesthesia, transferred to stretcher, and taken  to recovery room in stable condition.   POSTOPERATIVE PLAN: The patient will be observed in the recovery room.  If he is hemodynamically stable and pain is well controlled he is interested in discharging home with family.  We will have a low threshold to keep him overnight for observation given his medical history.

## 2020-10-17 NOTE — Evaluation (Signed)
Occupational Therapy Evaluation Patient Details Name: Micheal Bell MRN: [id number] DOB: 26-Nov-1970 Today's Date: 10/17/2020    History of Present Illness Patient is a 50 year old male s/p R reverse total shoulder arthroplasty. PMH includes CVA, MI, fibromyalgia.   Clinical Impression   Patient is a 50 year old male s/p shoulder replacement without functional use of right dominant upper extremity secondary to effects of surgery and interscalene block and shoulder precautions. Therapist provided education and instruction to patient and friend in regards to exercises, precautions, positioning, donning upper extremity clothing and bathing while maintaining shoulder precautions, ice and edema management and donning/doffing sling. Patient and friend verbalized understanding and demonstrated as needed. Patient needed assistance to donn shirt, underwear, pants, socks and shoes and provided with instruction on compensatory strategies to perform ADLs. Patient to follow up with MD for further therapy needs.      Follow Up Recommendations  Follow surgeon's recommendation for DC plan and follow-up therapies    Equipment Recommendations  None recommended by OT       Precautions / Restrictions Precautions Precautions: Shoulder Type of Shoulder Precautions: AROM elbow, wrist, hand ok. A/PROM shoulder NO Shoulder Interventions: Shoulder sling/immobilizer;Off for dressing/bathing/exercises Precaution Booklet Issued: Yes (comment) Required Braces or Orthoses: Sling Restrictions Weight Bearing Restrictions: Yes RUE Weight Bearing: Non weight bearing      Mobility Bed Mobility                    Transfers Overall transfer level: Needs assistance Equipment used: None Transfers: Sit to/from Stand Sit to Stand: Min assist         General transfer comment: initial posterior lean    Balance Overall balance assessment: Needs assistance Sitting-balance support: Feet supported Sitting  balance-Leahy Scale: Good     Standing balance support: No upper extremity supported Standing balance-Leahy Scale: Poor Standing balance comment: initial min A for posterior lean, then Supervision                           ADL either performed or assessed with clinical judgement   ADL Overall ADL's : Needs assistance/impaired Eating/Feeding: Independent   Grooming: Independent   Upper Body Bathing: Minimal assistance   Lower Body Bathing: Minimal assistance;Sit to/from stand;Sitting/lateral leans   Upper Body Dressing : Minimal assistance;Cueing for sequencing;Standing Upper Body Dressing Details (indicate cue type and reason): assist to thread numb R UE through sleeve Lower Body Dressing: Minimal assistance;Sit to/from stand;Sitting/lateral leans Lower Body Dressing Details (indicate cue type and reason): to pull up clothes over hips Toilet Transfer: Minimal assistance Toilet Transfer Details (indicate cue type and reason): patient leaning against chair in standing Toileting- Clothing Manipulation and Hygiene: Minimal assistance;Sitting/lateral lean;Sit to/from stand       Functional mobility during ADLs: Minimal assistance General ADL Comments: patient and friend educated in compensatory strategies for self care tasks in order to maintain shoulder precautions                         Pertinent Vitals/Pain Pain Assessment: Faces Faces Pain Scale: Hurts a little bit Pain Location: R UE Pain Descriptors / Indicators: Heaviness;Numbness Pain Intervention(s): Monitored during session     Hand Dominance Right   Extremity/Trunk Assessment Upper Extremity Assessment Upper Extremity Assessment: RUE deficits/detail RUE Deficits / Details: + nerve block   Lower Extremity Assessment Lower Extremity Assessment: Overall WFL for tasks assessed   Cervical / Trunk Assessment  Cervical / Trunk Assessment: Normal   Communication Communication Communication: No  difficulties   Cognition Arousal/Alertness: Awake/alert Behavior During Therapy: WFL for tasks assessed/performed Overall Cognitive Status: Within Functional Limits for tasks assessed                                           Exercises Exercises: Shoulder   Shoulder Instructions Shoulder Instructions Donning/doffing shirt without moving shoulder: Minimal assistance;Patient able to independently direct caregiver;Caregiver independent with task Method for sponge bathing under operated UE: Minimal assistance;Caregiver independent with task;Patient able to independently direct caregiver Donning/doffing sling/immobilizer: Moderate assistance;Caregiver independent with task;Patient able to independently direct caregiver Correct positioning of sling/immobilizer: Independent;Caregiver independent with task;Patient able to independently direct caregiver Pendulum exercises (written home exercise program):  (N/A) ROM for elbow, wrist and digits of operated UE: Patient able to independently direct caregiver;Caregiver independent with task Sling wearing schedule (on at all times/off for ADL's): Patient able to independently direct caregiver;Caregiver independent with task Proper positioning of operated UE when showering: Patient able to independently direct caregiver;Caregiver independent with task Dressing change:  (N/A) Positioning of UE while sleeping: Patient able to independently direct caregiver;Caregiver independent with task    Home Living Family/patient expects to be discharged to:: Private residence Living Arrangements: Parent Available Help at Discharge: Family;Available 24 hours/day Type of Home: House Home Access: Ramped entrance           Bathroom Shower/Tub: Tub/shower unit   Bathroom Toilet: Handicapped height     Home Equipment: Cane - single point;Walker - 2 wheels;Shower seat;Bedside commode          Prior Functioning/Environment Level of  Independence: Independent with assistive device(s)        Comments: cane vs walker        OT Problem List: Pain;Impaired UE functional use;Obesity;Decreased knowledge of precautions         OT Goals(Current goals can be found in the care plan section) Acute Rehab OT Goals Patient Stated Goal: home OT Goal Formulation: All assessment and education complete, DC therapy   AM-PAC OT "6 Clicks" Daily Activity     Outcome Measure Help from another person eating meals?: None Help from another person taking care of personal grooming?: None Help from another person toileting, which includes using toliet, bedpan, or urinal?: A Little Help from another person bathing (including washing, rinsing, drying)?: A Little Help from another person to put on and taking off regular upper body clothing?: A Little Help from another person to put on and taking off regular lower body clothing?: A Little 6 Click Score: 20   End of Session Equipment Utilized During Treatment:  (sling) Nurse Communication: Other (comment) (OT complete)  Activity Tolerance: Patient tolerated treatment well Patient left: in chair;with call bell/phone within reach;with family/visitor present  OT Visit Diagnosis: Pain Pain - Right/Left: Right Pain - part of body: Shoulder                Time: 2979-8921 OT Time Calculation (min): 28 min Charges:  OT General Charges $OT Visit: 1 Visit OT Evaluation $OT Eval Low Complexity: 1 Low  Marlyce Huge OT OT pager: [phone]   Carmelia Roller 10/17/2020, 1:04 PM

## 2020-10-18 NOTE — Anesthesia Postprocedure Evaluation (Signed)
Anesthesia Post Note  Patient: Micheal Bell  Procedure(s) Performed: REVERSE SHOULDER ARTHROPLASTY (Right: Shoulder)     Patient location during evaluation: PACU Anesthesia Type: General Level of consciousness: awake and alert Pain management: pain level controlled Vital Signs Assessment: post-procedure vital signs reviewed and stable Respiratory status: spontaneous breathing, nonlabored ventilation and respiratory function stable Cardiovascular status: stable and blood pressure returned to baseline Anesthetic complications: no   No notable events documented.  Last Vitals:  Vitals:   10/17/20 1135 10/17/20 1230  BP: 110/74 110/70  Pulse: 73 83  Resp: 12   Temp:    SpO2: 98% 99%    Last Pain:  Vitals:   10/17/20 1230  TempSrc:   PainSc: 5    Pain Goal: Patients Stated Pain Goal: 5 (10/17/20 0543)                 Beryle Lathe

## 2020-10-21 ENCOUNTER — Encounter (HOSPITAL_COMMUNITY): Payer: Self-pay | Admitting: Orthopedic Surgery

## 2020-10-29 DIAGNOSIS — G4733 Obstructive sleep apnea (adult) (pediatric): Secondary | ICD-10-CM | POA: Diagnosis not present

## 2020-10-30 DIAGNOSIS — Z96611 Presence of right artificial shoulder joint: Secondary | ICD-10-CM | POA: Diagnosis not present

## 2020-10-30 DIAGNOSIS — Z471 Aftercare following joint replacement surgery: Secondary | ICD-10-CM | POA: Diagnosis not present

## 2020-11-04 DIAGNOSIS — K219 Gastro-esophageal reflux disease without esophagitis: Secondary | ICD-10-CM | POA: Diagnosis not present

## 2020-11-04 DIAGNOSIS — Z1211 Encounter for screening for malignant neoplasm of colon: Secondary | ICD-10-CM | POA: Diagnosis not present

## 2020-11-04 DIAGNOSIS — K5903 Drug induced constipation: Secondary | ICD-10-CM | POA: Diagnosis not present

## 2020-11-07 ENCOUNTER — Ambulatory Visit: Payer: Medicaid Other | Attending: Surgical | Admitting: Physical Therapy

## 2020-11-07 ENCOUNTER — Ambulatory Visit: Payer: Medicaid Other | Admitting: Podiatry

## 2020-11-07 ENCOUNTER — Other Ambulatory Visit: Payer: Self-pay

## 2020-11-07 DIAGNOSIS — G8929 Other chronic pain: Secondary | ICD-10-CM | POA: Insufficient documentation

## 2020-11-07 DIAGNOSIS — M25611 Stiffness of right shoulder, not elsewhere classified: Secondary | ICD-10-CM | POA: Diagnosis not present

## 2020-11-07 DIAGNOSIS — M25511 Pain in right shoulder: Secondary | ICD-10-CM | POA: Insufficient documentation

## 2020-11-07 NOTE — Therapy (Signed)
[name] [address] [place], [place], [address] Phone: [phone]   Fax:  [phone]  Physical Therapy Evaluation  Patient Details  Name: Micheal Bell MRN: [id number] Date of Birth: Dec 27, 1970 Referring Provider (PT): Amber Portfield PA-C   Encounter Date: 11/07/2020   PT End of Session - 11/07/20 1536     Visit Number 1    Number of Visits 12    Date for PT Re-Evaluation 12/26/20    PT Start Time 0145    PT Stop Time 0213    PT Time Calculation (min) 28 min    Behavior During Therapy [facility] for tasks assessed/performed             Past Medical History:  Diagnosis Date   Anxiety    Arthritis    Asthma    inhaler daily   CHF (congestive heart failure) (HCC)    Coronary artery disease    Depression    Diabetes mellitus without complication (HCC)    DVT (deep venous thrombosis) (HCC)    Dyspnea    Fibromyalgia    Gout    History of kidney stones    Hypertension    IBS (irritable bowel syndrome)    Myocardial infarction (HCC) 09/2019   Pneumonia 09/2019   Stroke (HCC) 09/2019   Vein disorder     Past Surgical History:  Procedure Laterality Date   CORONARY ATHERECTOMY N/A 02/20/2020   Procedure: CORONARY ATHERECTOMY;  Surgeon: [name], [name], MD;  Location: [facility] INVASIVE CV LAB;  Service: Cardiovascular;  Laterality: N/A;  CSI LAD   CORONARY STENT INTERVENTION N/A 02/20/2020   Procedure: CORONARY STENT INTERVENTION;  Surgeon: [name], [name], MD;  Location: [facility] INVASIVE CV LAB;  Service: Cardiovascular;  Laterality: N/A;  LAD CFX   FOOT SURGERY     INTRAVASCULAR ULTRASOUND/IVUS N/A 02/20/2020   Procedure: Intravascular Ultrasound/IVUS;  Surgeon: [name], [name], MD;  Location: [facility] INVASIVE CV LAB;  Service: Cardiovascular;  Laterality: N/A;   LEFT HEART CATH AND CORONARY ANGIOGRAPHY N/A 02/12/2020   Procedure: LEFT HEART CATH AND CORONARY ANGIOGRAPHY;  Surgeon: [name], MD;  Location: [facility] INVASIVE CV LAB;   Service: Cardiovascular;  Laterality: N/A;   REVERSE SHOULDER ARTHROPLASTY Right 10/17/2020   Procedure: REVERSE SHOULDER ARTHROPLASTY;  Surgeon: Jones Broom, MD;  Location: [facility] ORS;  Service: Orthopedics;  Laterality: Right;   TEE WITHOUT CARDIOVERSION N/A 10/24/2019   Procedure: TRANSESOPHAGEAL ECHOCARDIOGRAM (TEE);  Surgeon: [name], MD;  Location: [facility] ENDOSCOPY;  Service: Cardiovascular;  Laterality: N/A;    There were no vitals filed for this visit.    Subjective Assessment - 11/07/20 1538     Subjective COVID-19 screen performed prior to patient entering clinic.  The patient presents to the clinic today s/p right total shoulder replacement performed on 10/17/20.  Hispain is rated at an 8/10 today.  He states he has found anything really decreases his pain a great deal but her is using an Scientist, clinical (histocompatibility and immunogenetics)" at home which helps some.  He is out of the sling and anything type of movemnt is painful.  He states that while lying down he uses his left UE to raise his right shoulder up.    Pertinent History CHF, CAD, DM, Fibromyalgia, MI, stroke, HTN, foot surgery, coronary stent.    Patient Stated Goals Would like to use right UE functionally with less pain.    Currently in Pain? Yes    Pain Score 8     Pain Location Shoulder  Pain Orientation Right    Pain Descriptors / Indicators Aching;Throbbing;Sore;Numbness;Shooting    Pain Type Surgical pain    Pain Onset 1 to 4 weeks ago    Pain Frequency Constant    Aggravating Factors  See above.    Pain Relieving Factors See above.                [facility] PT Assessment - 11/07/20 0001       Assessment   Medical Diagnosis Right total shoulder replacement.    Referring Provider (PT) Amber Portfield PA-C    Onset Date/Surgical Date --   10/17/20(surgery date).     Precautions   Precaution Comments No ultrasound.  Progress per Dr. Veda Canning TSA protocol.      Restrictions   Weight Bearing Restrictions Yes      Balance Screen   Has  the patient fallen in the past 6 months Yes    How many times? 4-5    Has the patient had a decrease in activity level because of a fear of falling?  No    Is the patient reluctant to leave their home because of a fear of falling?  No      Home Environment   Living Environment Private residence      Prior Function   Level of Independence Independent      Observation/Other Assessments-Edema    Edema --   MInimal+ right shoulder edema.     Posture/Postural Control   Posture/Postural Control Postural limitations    Postural Limitations Rounded Shoulders;Forward head      ROM / Strength   AROM / PROM / Strength PROM      PROM   Overall PROM Comments In supine:  P/AROM into right shoulder flexion is 90 degrees and ER is 10 degrees.      Palpation   Palpation comment Diffuse anterior right shoulder tenderness.      Ambulation/Gait   Gait Comments Patient ambulating with a straight cane.                        Objective measurements completed on examination: See above findings.                     PT Long Term Goals - 11/07/20 1557       PT LONG TERM GOAL #1   Title Independent with a HEP.    Baseline No knowledge of appropriate ther ex.    Time 6    Period Weeks    Status New      PT LONG TERM GOAL #2   Title Active right shoulder flexion to 135 degrees so the patient can easily reach overhead.    Baseline P/Arom to 90 degrees.    Time 6    Period Weeks    Status New      PT LONG TERM GOAL #3   Title Active ER to 70 degrees+ to allow for easily donning/doffing of apparel.    Baseline 10 degrees.    Time 6    Period Weeks    Status New      PT LONG TERM GOAL #4   Title Increase right shoulder strength to a solid 4 to 4+/5 to increase stability for performance of functional activities.    Baseline NT    Time 6    Period Weeks    Status New      PT LONG TERM GOAL #5  Title Perform ADL's with pain not > 3/10.    Baseline High  pain with movement of right shoulder.    Time 6    Period Weeks    Status New                    Plan - 11/07/20 1549     Clinical Impression Statement The patient presenst to OPPT s/p right total shoulder replacement performed on 10/17/20.  He is still currently in a lot of pain.  He has a loss of range of motion (assessed in a P/AROM fashion today) as expected.  He is out of the sling and has performed active-assisitve range of motion in supine using his right UE to assist his left.  He is diffusely palpably tender over his right anterior shoulder region.Patient will benefit from skilled physical therapy intervention to address pain and deficits.    Personal Factors and Comorbidities Comorbidity 1;Comorbidity 2    Comorbidities CHF, CAD, DM, Fibromyalgia, MI, stroke, HTN, foot surgery, coronary stent.    Examination-Activity Limitations Other;Reach Overhead    Examination-Participation Restrictions Other    Stability/Clinical Decision Making Stable/Uncomplicated    Rehab Potential Good    PT Frequency 2x / week    PT Duration 6 weeks    PT Treatment/Interventions ADLs/Self Care Home Management;Cryotherapy;Electrical Stimulation;Moist Heat;Functional mobility training;Therapeutic activities;Therapeutic exercise;Neuromuscular re-education;Manual techniques;Patient/family education;Passive range of motion;Vasopneumatic Device    PT Next Visit Plan progress per protocol.  Begin with gentle right shoulder P/AROM.  Vasopneumatic with pillow between thorax and right elbow.    Consulted and Agree with Plan of Care Patient             Patient will benefit from skilled therapeutic intervention in order to improve the following deficits and impairments:  Decreased activity tolerance, Decreased range of motion, Pain, Increased edema  Visit Diagnosis: Chronic right shoulder pain - Plan: PT plan of care cert/re-cert  Stiffness of right shoulder, not elsewhere classified - Plan: PT plan  of care cert/re-cert     Problem List Patient Active Problem List   Diagnosis Date Noted   Hypomagnesemia    Status post coronary artery stent placement    Chest pain 02/21/2020   Hypokalemia    Hyperlipidemia with target LDL less than 70    Angina pectoris (HCC) 02/20/2020   Chronic systolic heart failure (HCC) 02/12/2020   Coronary artery disease with exertional angina (HCC)    Cerebritis 12/06/2019   Cutaneous abscess of left foot    Diabetic foot (HCC)    Acute systolic heart failure (HCC)    Embolic infarction (HCC)    Hyperglycemia    Hypoxia    Lower respiratory tract infection due to COVID-19 virus 10/10/2019   Benign hypertension 07/23/2015   Gastro-esophageal reflux disease with esophagitis 07/23/2015   Gout 07/23/2015   Obstructive sleep apnea 07/23/2015   Snoring 07/23/2015   Chronic idiopathic gout of multiple sites 06/02/2015   Class 2 obesity in adult 06/02/2015   Fibromyalgia 06/02/2015   Irritable bowel syndrome with diarrhea 06/02/2015   Mild persistent asthma 06/02/2015   Reaction, adjustment, with depressed mood, prolonged 06/02/2015   Type 2 diabetes mellitus without complication, without long-term current use of insulin (HCC) 06/02/2015    Leydy Worthey, [place], PT 11/07/2020, 4:43 PM  [name] [address] [place], [place], [address] Phone: [phone]   Fax:  [phone]  Name: MADDEN GARRON MRN: [id number] Date of Birth: [birth year]

## 2020-11-14 ENCOUNTER — Encounter: Payer: Self-pay | Admitting: Physical Therapy

## 2020-11-14 ENCOUNTER — Other Ambulatory Visit: Payer: Self-pay

## 2020-11-14 ENCOUNTER — Ambulatory Visit: Payer: Medicaid Other | Admitting: Physical Therapy

## 2020-11-14 DIAGNOSIS — G8929 Other chronic pain: Secondary | ICD-10-CM | POA: Diagnosis not present

## 2020-11-14 DIAGNOSIS — M25611 Stiffness of right shoulder, not elsewhere classified: Secondary | ICD-10-CM | POA: Diagnosis not present

## 2020-11-14 DIAGNOSIS — M25511 Pain in right shoulder: Secondary | ICD-10-CM | POA: Diagnosis not present

## 2020-11-14 NOTE — Therapy (Signed)
[name] [address] [place], [place], [address] Phone: [phone]   Fax:  [phone]  Physical Therapy Treatment  Patient Details  Name: Micheal Bell MRN: [id number] Date of Birth: [birth year] Referring Provider (PT): Amber Portfield PA-C   Encounter Date: 11/14/2020   PT End of Session - 11/14/20 1307     Visit Number 2    Number of Visits 12    Date for PT Re-Evaluation 12/26/20    PT Start Time 1307    PT Stop Time 1345    PT Time Calculation (min) 38 min    Activity Tolerance Patient tolerated treatment well    Behavior During Therapy [facility] for tasks assessed/performed             Past Medical History:  Diagnosis Date   Anxiety    Arthritis    Asthma    inhaler daily   CHF (congestive heart failure) (HCC)    Coronary artery disease    Depression    Diabetes mellitus without complication (HCC)    DVT (deep venous thrombosis) (HCC)    Dyspnea    Fibromyalgia    Gout    History of kidney stones    Hypertension    IBS (irritable bowel syndrome)    Myocardial infarction (HCC) 09/2019   Pneumonia 09/2019   Stroke (HCC) 09/2019   Vein disorder     Past Surgical History:  Procedure Laterality Date   CORONARY ATHERECTOMY N/A 02/20/2020   Procedure: CORONARY ATHERECTOMY;  Surgeon: [name], [name], MD;  Location: [facility] INVASIVE CV LAB;  Service: Cardiovascular;  Laterality: N/A;  CSI LAD   CORONARY STENT INTERVENTION N/A 02/20/2020   Procedure: CORONARY STENT INTERVENTION;  Surgeon: [name], [name], MD;  Location: [facility] INVASIVE CV LAB;  Service: Cardiovascular;  Laterality: N/A;  LAD CFX   FOOT SURGERY     INTRAVASCULAR ULTRASOUND/IVUS N/A 02/20/2020   Procedure: Intravascular Ultrasound/IVUS;  Surgeon: [name], [name], MD;  Location: [facility] INVASIVE CV LAB;  Service: Cardiovascular;  Laterality: N/A;   LEFT HEART CATH AND CORONARY ANGIOGRAPHY N/A 02/12/2020   Procedure: LEFT HEART CATH AND CORONARY ANGIOGRAPHY;   Surgeon: [name], MD;  Location: [facility] INVASIVE CV LAB;  Service: Cardiovascular;  Laterality: N/A;   REVERSE SHOULDER ARTHROPLASTY Right 10/17/2020   Procedure: REVERSE SHOULDER ARTHROPLASTY;  Surgeon: Jones Broom, MD;  Location: [facility] ORS;  Service: Orthopedics;  Laterality: Right;   TEE WITHOUT CARDIOVERSION N/A 10/24/2019   Procedure: TRANSESOPHAGEAL ECHOCARDIOGRAM (TEE);  Surgeon: [name], MD;  Location: [facility] ENDOSCOPY;  Service: Cardiovascular;  Laterality: N/A;    There were no vitals filed for this visit.   Subjective Assessment - 11/14/20 1306     Subjective COVID-19 screen performed prior to patient entering clinic. Rpeorts he has been using RUE shoulder.    Pertinent History CHF, CAD, DM, Fibromyalgia, MI, stroke, HTN, foot surgery, coronary stent.    Patient Stated Goals Would like to use right UE functionally with less pain.    Currently in Pain? Yes    Pain Location Arm    Pain Orientation Right;Proximal    Pain Descriptors / Indicators Discomfort;Other (Comment)   cold   Pain Type Surgical pain    Pain Onset 1 to 4 weeks ago    Pain Frequency Constant                [facility] PT Assessment - 11/14/20 0001       Assessment   Medical Diagnosis Right  total shoulder replacement.    Referring Provider (PT) Amber Portfield PA-C    Onset Date/Surgical Date 10/17/20      Precautions   Precaution Comments No ultrasound.  Progress per Dr. Veda Canning TSA protocol.                           [facility] Adult PT Treatment/Exercise - 11/14/20 0001       Modalities   Modalities Vasopneumatic      Vasopneumatic   Number Minutes Vasopneumatic  10 minutes    Vasopnuematic Location  Shoulder    Vasopneumatic Pressure Low    Vasopneumatic Temperature  34/pain      Manual Therapy   Manual Therapy Passive ROM    Passive ROM PROM/AAROM of R shoulder into flex/ ER with gentle holds at end range                          PT Long Term  Goals - 11/07/20 1557       PT LONG TERM GOAL #1   Title Independent with a HEP.    Baseline No knowledge of appropriate ther ex.    Time 6    Period Weeks    Status New      PT LONG TERM GOAL #2   Title Active right shoulder flexion to 135 degrees so the patient can easily reach overhead.    Baseline P/Arom to 90 degrees.    Time 6    Period Weeks    Status New      PT LONG TERM GOAL #3   Title Active ER to 70 degrees+ to allow for easily donning/doffing of apparel.    Baseline 10 degrees.    Time 6    Period Weeks    Status New      PT LONG TERM GOAL #4   Title Increase right shoulder strength to a solid 4 to 4+/5 to increase stability for performance of functional activities.    Baseline NT    Time 6    Period Weeks    Status New      PT LONG TERM GOAL #5   Title Perform ADL's with pain not > 3/10.    Baseline High pain with movement of right shoulder.    Time 6    Period Weeks    Status New                   Plan - 11/14/20 1402     Clinical Impression Statement Patient presented in clinic with reports of low level discomfort of R deltoids region. Patient denies sling and states that he was instructed to not wear it except in public. Patient required VCs for relaxation in order to avoid pain initially with P/AAROM of R shoulder in flexion and ER. Firm end feels and smooth arc of motion noted during P/AAROM. Normal vasopneumatic response noted following removal of the modality.    Personal Factors and Comorbidities Comorbidity 1;Comorbidity 2    Comorbidities CHF, CAD, DM, Fibromyalgia, MI, stroke, HTN, foot surgery, coronary stent.    Examination-Activity Limitations Other;Reach Overhead    Examination-Participation Restrictions Other    Stability/Clinical Decision Making Stable/Uncomplicated    Rehab Potential Good    PT Frequency 2x / week    PT Duration 6 weeks    PT Treatment/Interventions ADLs/Self Care Home Management;Cryotherapy;Electrical  Stimulation;Moist Heat;Functional mobility training;Therapeutic activities;Therapeutic exercise;Neuromuscular re-education;Manual techniques;Patient/family education;Passive  range of motion;Vasopneumatic Device    PT Next Visit Plan progress per protocol.  Begin with gentle right shoulder P/AROM.  Vasopneumatic with pillow between thorax and right elbow.    Consulted and Agree with Plan of Care Patient             Patient will benefit from skilled therapeutic intervention in order to improve the following deficits and impairments:  Decreased activity tolerance, Decreased range of motion, Pain, Increased edema  Visit Diagnosis: Chronic right shoulder pain  Stiffness of right shoulder, not elsewhere classified     Problem List Patient Active Problem List   Diagnosis Date Noted   Hypomagnesemia    Status post coronary artery stent placement    Chest pain 02/21/2020   Hypokalemia    Hyperlipidemia with target LDL less than 70    Angina pectoris (HCC) 02/20/2020   Chronic systolic heart failure (HCC) 02/12/2020   Coronary artery disease with exertional angina (HCC)    Cerebritis 12/06/2019   Cutaneous abscess of left foot    Diabetic foot (HCC)    Acute systolic heart failure (HCC)    Embolic infarction (HCC)    Hyperglycemia    Hypoxia    Lower respiratory tract infection due to COVID-19 virus 10/10/2019   Benign hypertension 07/23/2015   Gastro-esophageal reflux disease with esophagitis 07/23/2015   Gout 07/23/2015   Obstructive sleep apnea 07/23/2015   Snoring 07/23/2015   Chronic idiopathic gout of multiple sites 06/02/2015   Class 2 obesity in adult 06/02/2015   Fibromyalgia 06/02/2015   Irritable bowel syndrome with diarrhea 06/02/2015   Mild persistent asthma 06/02/2015   Reaction, adjustment, with depressed mood, prolonged 06/02/2015   Type 2 diabetes mellitus without complication, without long-term current use of insulin (HCC) 06/02/2015    Marvell Fuller,  PTA 11/14/2020, 2:13 PM  [name] [address] [place], [place], [address] Phone: [phone]   Fax:  [phone]  Name: Micheal Bell MRN: [id number] Date of Birth: 05-06-1970

## 2020-11-18 DIAGNOSIS — G4733 Obstructive sleep apnea (adult) (pediatric): Secondary | ICD-10-CM | POA: Diagnosis not present

## 2020-11-19 ENCOUNTER — Ambulatory Visit (INDEPENDENT_AMBULATORY_CARE_PROVIDER_SITE_OTHER): Payer: Medicaid Other

## 2020-11-19 ENCOUNTER — Other Ambulatory Visit: Payer: Self-pay

## 2020-11-19 ENCOUNTER — Ambulatory Visit (INDEPENDENT_AMBULATORY_CARE_PROVIDER_SITE_OTHER): Payer: Medicaid Other | Admitting: Podiatry

## 2020-11-19 ENCOUNTER — Ambulatory Visit: Payer: Medicaid Other | Admitting: *Deleted

## 2020-11-19 DIAGNOSIS — E11621 Type 2 diabetes mellitus with foot ulcer: Secondary | ICD-10-CM

## 2020-11-19 DIAGNOSIS — E1142 Type 2 diabetes mellitus with diabetic polyneuropathy: Secondary | ICD-10-CM

## 2020-11-19 DIAGNOSIS — G4733 Obstructive sleep apnea (adult) (pediatric): Secondary | ICD-10-CM | POA: Diagnosis not present

## 2020-11-19 DIAGNOSIS — M79674 Pain in right toe(s): Secondary | ICD-10-CM

## 2020-11-19 DIAGNOSIS — M25611 Stiffness of right shoulder, not elsewhere classified: Secondary | ICD-10-CM | POA: Diagnosis not present

## 2020-11-19 DIAGNOSIS — M25511 Pain in right shoulder: Secondary | ICD-10-CM

## 2020-11-19 DIAGNOSIS — B351 Tinea unguium: Secondary | ICD-10-CM | POA: Diagnosis not present

## 2020-11-19 DIAGNOSIS — L97422 Non-pressure chronic ulcer of left heel and midfoot with fat layer exposed: Secondary | ICD-10-CM | POA: Diagnosis not present

## 2020-11-19 DIAGNOSIS — M79675 Pain in left toe(s): Secondary | ICD-10-CM | POA: Diagnosis not present

## 2020-11-19 DIAGNOSIS — G8929 Other chronic pain: Secondary | ICD-10-CM | POA: Diagnosis not present

## 2020-11-19 DIAGNOSIS — E11610 Type 2 diabetes mellitus with diabetic neuropathic arthropathy: Secondary | ICD-10-CM

## 2020-11-19 NOTE — Therapy (Signed)
[name] [address] [place], [place], [address] Phone: [phone]   Fax:  [phone]  Physical Therapy Treatment  Patient Details  Name: Micheal Bell MRN: [id number] Date of Birth: November 08, 1970 Referring Provider (PT): Amber Portfield PA-C   Encounter Date: 11/19/2020   PT End of Session - 11/19/20 0844     Visit Number 3    Number of Visits 12    Date for PT Re-Evaluation 12/26/20    PT Start Time 0815    PT Stop Time 0905    PT Time Calculation (min) 50 min             Past Medical History:  Diagnosis Date   Anxiety    Arthritis    Asthma    inhaler daily   CHF (congestive heart failure) (HCC)    Coronary artery disease    Depression    Diabetes mellitus without complication (HCC)    DVT (deep venous thrombosis) (HCC)    Dyspnea    Fibromyalgia    Gout    History of kidney stones    Hypertension    IBS (irritable bowel syndrome)    Myocardial infarction (HCC) 09/2019   Pneumonia 09/2019   Stroke (HCC) 09/2019   Vein disorder     Past Surgical History:  Procedure Laterality Date   CORONARY ATHERECTOMY N/A 02/20/2020   Procedure: CORONARY ATHERECTOMY;  Surgeon: [name], [name], MD;  Location: [facility] INVASIVE CV LAB;  Service: Cardiovascular;  Laterality: N/A;  CSI LAD   CORONARY STENT INTERVENTION N/A 02/20/2020   Procedure: CORONARY STENT INTERVENTION;  Surgeon: [name], [name], MD;  Location: [facility] INVASIVE CV LAB;  Service: Cardiovascular;  Laterality: N/A;  LAD CFX   FOOT SURGERY     INTRAVASCULAR ULTRASOUND/IVUS N/A 02/20/2020   Procedure: Intravascular Ultrasound/IVUS;  Surgeon: [name], [name], MD;  Location: [facility] INVASIVE CV LAB;  Service: Cardiovascular;  Laterality: N/A;   LEFT HEART CATH AND CORONARY ANGIOGRAPHY N/A 02/12/2020   Procedure: LEFT HEART CATH AND CORONARY ANGIOGRAPHY;  Surgeon: [name], MD;  Location: [facility] INVASIVE CV LAB;  Service: Cardiovascular;  Laterality: N/A;   REVERSE  SHOULDER ARTHROPLASTY Right 10/17/2020   Procedure: REVERSE SHOULDER ARTHROPLASTY;  Surgeon: Jones Broom, MD;  Location: [facility];  Service: Orthopedics;  Laterality: Right;   TEE WITHOUT CARDIOVERSION N/A 10/24/2019   Procedure: TRANSESOPHAGEAL ECHOCARDIOGRAM (TEE);  Surgeon: [name], MD;  Location: [facility] ENDOSCOPY;  Service: Cardiovascular;  Laterality: N/A;    There were no vitals filed for this visit.   Subjective Assessment - 11/19/20 0816     Subjective COVID-19 screen performed prior to patient entering clinic. Rpeorts he has been using RUE shoulder.    Pertinent History CHF, CAD, DM, Fibromyalgia, MI, stroke, HTN, foot surgery, coronary stent.    Patient Stated Goals Would like to use right UE functionally with less pain.    Currently in Pain? Yes    Pain Score 5     Pain Location Arm    Pain Orientation Right;Proximal    Pain Descriptors / Indicators Discomfort    Pain Type Surgical pain    Pain Onset 1 to 4 weeks ago                               [facility] Adult PT Treatment/Exercise - 11/19/20 0001       Modalities   Modalities Vasopneumatic      Vasopneumatic  Number Minutes Vasopneumatic  10 minutes    Vasopnuematic Location  Shoulder    Vasopneumatic Pressure Low    Vasopneumatic Temperature  34/pain      Manual Therapy   Manual Therapy Passive ROM    Passive ROM PROM/AAROM of R shoulder into flex/ ER with gentle holds at end range                          PT Long Term Goals - 11/07/20 1557       PT LONG TERM GOAL #1   Title Independent with a HEP.    Baseline No knowledge of appropriate ther ex.    Time 6    Period Weeks    Status New      PT LONG TERM GOAL #2   Title Active right shoulder flexion to 135 degrees so the patient can easily reach overhead.    Baseline P/Arom to 90 degrees.    Time 6    Period Weeks    Status New      PT LONG TERM GOAL #3   Title Active ER to 70 degrees+ to allow for easily  donning/doffing of apparel.    Baseline 10 degrees.    Time 6    Period Weeks    Status New      PT LONG TERM GOAL #4   Title Increase right shoulder strength to a solid 4 to 4+/5 to increase stability for performance of functional activities.    Baseline NT    Time 6    Period Weeks    Status New      PT LONG TERM GOAL #5   Title Perform ADL's with pain not > 3/10.    Baseline High pain with movement of right shoulder.    Time 6    Period Weeks    Status New                   Plan - 11/19/20 0845     Clinical Impression Statement Pt arrived today doing fairly well with decreased pain RT shldr. Rx focused on P/AAROM for elevation and ER and was tolerated wellNormal Vasopneumatic response today with pain 4-5/10    Personal Factors and Comorbidities Comorbidity 1;Comorbidity 2    Comorbidities CHF, CAD, DM, Fibromyalgia, MI, stroke, HTN, foot surgery, coronary stent.    Examination-Activity Limitations Other;Reach Overhead    Examination-Participation Restrictions Other    Stability/Clinical Decision Making Stable/Uncomplicated    Rehab Potential Good    PT Frequency 2x / week    PT Duration 6 weeks    PT Treatment/Interventions ADLs/Self Care Home Management;Cryotherapy;Electrical Stimulation;Moist Heat;Functional mobility training;Therapeutic activities;Therapeutic exercise;Neuromuscular re-education;Manual techniques;Patient/family education;Passive range of motion;Vasopneumatic Device    PT Next Visit Plan progress per protocol.  Begin with gentle right shoulder P/AROM.  Vasopneumatic with pillow between thorax and right elbow.    Consulted and Agree with Plan of Care Patient             Patient will benefit from skilled therapeutic intervention in order to improve the following deficits and impairments:  Decreased activity tolerance, Decreased range of motion, Pain, Increased edema  Visit Diagnosis: Chronic right shoulder pain  Stiffness of right shoulder,  not elsewhere classified     Problem List Patient Active Problem List   Diagnosis Date Noted   Hypomagnesemia    Status post coronary artery stent placement    Chest pain 02/21/2020   Hypokalemia  Hyperlipidemia with target LDL less than 70    Angina pectoris (HCC) 02/20/2020   Chronic systolic heart failure (HCC) 02/12/2020   Coronary artery disease with exertional angina (HCC)    Cerebritis 12/06/2019   Cutaneous abscess of left foot    Diabetic foot (HCC)    Acute systolic heart failure (HCC)    Embolic infarction (HCC)    Hyperglycemia    Hypoxia    Lower respiratory tract infection due to COVID-19 virus 10/10/2019   Benign hypertension 07/23/2015   Gastro-esophageal reflux disease with esophagitis 07/23/2015   Gout 07/23/2015   Obstructive sleep apnea 07/23/2015   Snoring 07/23/2015   Chronic idiopathic gout of multiple sites 06/02/2015   Class 2 obesity in adult 06/02/2015   Fibromyalgia 06/02/2015   Irritable bowel syndrome with diarrhea 06/02/2015   Mild persistent asthma 06/02/2015   Reaction, adjustment, with depressed mood, prolonged 06/02/2015   Type 2 diabetes mellitus without complication, without long-term current use of insulin (HCC) 06/02/2015    Brytani Voth,CHRIS, PTA 11/19/2020, 9:08 AM  [name] [address] [place], [place], [address] Phone: [phone]   Fax:  [phone]  Name: Micheal Bell MRN: [id number] Date of Birth: September 03, 1970

## 2020-11-20 NOTE — Progress Notes (Signed)
  Subjective:  Patient ID: Micheal Bell, male    DOB: March 13, 1970,  MRN: [id number]  Chief Complaint  Patient presents with   Nail Problem    Diabetic foot care     50 y.o. male returns with the above complaint. History confirmed with patient.  He is doing well having some left foot pain he says he stepped down and felt like something cracked or popped.  He is wearing his new diabetic shoes he complains today of elongated thickened yellowed toenails that are difficult to cut  Objective:  Physical Exam: warm, good capillary refill, normal DP and PT pulses, reduced sensation grossly throughout the left lower extremity, onychomycosis x10 with yellow and elongated thickened nail plates Left Foot: C-shaped foot type with prominent fifth metatarsal base, ulceration is healed, minimal edema and tenderness over the lateral column   Radiographs taken today show no new fracture or dislocation and there is midtarsal arthritis secondary to Charcot and his severe foot forming.  The prior metatarsal fractures have completely healed  Assessment:   1. Charcot's arthropathy, diabetic (HCC)   2. Pain due to onychomycosis of toenails of both feet   3. Type 2 diabetes mellitus with diabetic polyneuropathy, without long-term current use of insulin (HCC)       Plan:  Patient was evaluated and treated and all questions answered.  Patient educated on diabetes. Discussed proper diabetic foot care and discussed risks and complications of disease. Educated patient in depth on reasons to return to the office immediately should he/she discover anything concerning or new on the feet. All questions answered. Discussed proper shoes as well.   Continue wearing diabetic shoes  Discussed the etiology and treatment options for the condition in detail with the patient. Educated patient on the topical and oral treatment options for mycotic nails. Recommended debridement of the nails today. Sharp and mechanical  debridement performed of all painful and mycotic nails today. Nails debrided in length and thickness using a nail nipper to level of comfort. Discussed treatment options including appropriate shoe gear. Follow up as needed for painful nails.  Fortunately has no new fracture dislocation or worsening deformity.  Continue to monitor and wear supportive shoe gear.   No follow-ups on file.    Sharl Ma, DPM 11/20/2020

## 2020-11-21 ENCOUNTER — Ambulatory Visit: Payer: Medicaid Other | Admitting: *Deleted

## 2020-11-21 ENCOUNTER — Other Ambulatory Visit: Payer: Self-pay

## 2020-11-21 DIAGNOSIS — M25511 Pain in right shoulder: Secondary | ICD-10-CM

## 2020-11-21 DIAGNOSIS — G8929 Other chronic pain: Secondary | ICD-10-CM

## 2020-11-21 DIAGNOSIS — M25611 Stiffness of right shoulder, not elsewhere classified: Secondary | ICD-10-CM

## 2020-11-21 NOTE — Therapy (Signed)
[name] [address] [place], [place], [address] Phone: [phone]   Fax:  [phone]  Physical Therapy Treatment  Patient Details  Name: Micheal Bell MRN: [id number] Date of Birth: 1970-12-12 Referring Provider (PT): Amber Portfield PA-C   Encounter Date: 11/21/2020   PT End of Session - 11/21/20 0901     Visit Number 4    Number of Visits 12    Date for PT Re-Evaluation 12/26/20    PT Start Time 0815    PT Stop Time 0905    PT Time Calculation (min) 50 min             Past Medical History:  Diagnosis Date   Anxiety    Arthritis    Asthma    inhaler daily   CHF (congestive heart failure) (HCC)    Coronary artery disease    Depression    Diabetes mellitus without complication (HCC)    DVT (deep venous thrombosis) (HCC)    Dyspnea    Fibromyalgia    Gout    History of kidney stones    Hypertension    IBS (irritable bowel syndrome)    Myocardial infarction (HCC) 09/2019   Pneumonia 09/2019   Stroke (HCC) 09/2019   Vein disorder     Past Surgical History:  Procedure Laterality Date   CORONARY ATHERECTOMY N/A 02/20/2020   Procedure: CORONARY ATHERECTOMY;  Surgeon: [name], [name], MD;  Location: [facility] INVASIVE CV LAB;  Service: Cardiovascular;  Laterality: N/A;  CSI LAD   CORONARY STENT INTERVENTION N/A 02/20/2020   Procedure: CORONARY STENT INTERVENTION;  Surgeon: [name], [name], MD;  Location: [facility] INVASIVE CV LAB;  Service: Cardiovascular;  Laterality: N/A;  LAD CFX   FOOT SURGERY     INTRAVASCULAR ULTRASOUND/IVUS N/A 02/20/2020   Procedure: Intravascular Ultrasound/IVUS;  Surgeon: [name], [name], MD;  Location: [facility] INVASIVE CV LAB;  Service: Cardiovascular;  Laterality: N/A;   LEFT HEART CATH AND CORONARY ANGIOGRAPHY N/A 02/12/2020   Procedure: LEFT HEART CATH AND CORONARY ANGIOGRAPHY;  Surgeon: [name], MD;  Location: [facility] INVASIVE CV LAB;  Service: Cardiovascular;  Laterality: N/A;   REVERSE  SHOULDER ARTHROPLASTY Right 10/17/2020   Procedure: REVERSE SHOULDER ARTHROPLASTY;  Surgeon: Jones Broom, MD;  Location: [facility];  Service: Orthopedics;  Laterality: Right;   TEE WITHOUT CARDIOVERSION N/A 10/24/2019   Procedure: TRANSESOPHAGEAL ECHOCARDIOGRAM (TEE);  Surgeon: [name], MD;  Location: [facility] ENDOSCOPY;  Service: Cardiovascular;  Laterality: N/A;    There were no vitals filed for this visit.   Subjective Assessment - 11/21/20 0830     Subjective COVID-19 screen performed prior to patient entering clinic. Rpeorts he has been using RUE shoulder.4-5/10 pain today    Pertinent History CHF, CAD, DM, Fibromyalgia, MI, stroke, HTN, foot surgery, coronary stent.    Patient Stated Goals Would like to use right UE functionally with less pain.    Currently in Pain? Yes    Pain Score 5     Pain Location Arm    Pain Orientation Right;Proximal    Pain Descriptors / Indicators Discomfort;Sore    Pain Type Surgical pain    Pain Onset 1 to 4 weeks ago                               [facility] Adult PT Treatment/Exercise - 11/21/20 0001       Modalities   Modalities Vasopneumatic  Vasopneumatic   Number Minutes Vasopneumatic  15 minutes    Vasopnuematic Location  Shoulder    Vasopneumatic Pressure Low    Vasopneumatic Temperature  34/pain      Manual Therapy   Manual Therapy Passive ROM    Manual therapy comments PROM ER to 30 degrees and P/AAROM to 135 degrees available ROM no pain    Passive ROM PROM/AAROM of R shoulder into flex/ ER with gentle holds at end range                          PT Long Term Goals - 11/07/20 1557       PT LONG TERM GOAL #1   Title Independent with a HEP.    Baseline No knowledge of appropriate ther ex.    Time 6    Period Weeks    Status New      PT LONG TERM GOAL #2   Title Active right shoulder flexion to 135 degrees so the patient can easily reach overhead.    Baseline P/Arom to 90 degrees.     Time 6    Period Weeks    Status New      PT LONG TERM GOAL #3   Title Active ER to 70 degrees+ to allow for easily donning/doffing of apparel.    Baseline 10 degrees.    Time 6    Period Weeks    Status New      PT LONG TERM GOAL #4   Title Increase right shoulder strength to a solid 4 to 4+/5 to increase stability for performance of functional activities.    Baseline NT    Time 6    Period Weeks    Status New      PT LONG TERM GOAL #5   Title Perform ADL's with pain not > 3/10.    Baseline High pain with movement of right shoulder.    Time 6    Period Weeks    Status New                   Plan - 11/21/20 0901     Clinical Impression Statement Pt reports doing fairly well today with low pain levels RT shldr. Rx focused on Manual  Passive and AAROM  RT shldr. PROM/ AAROM ER 30 degrees and flexion to 135 degrees available ROM no pain. Vaso end of Rx.    Personal Factors and Comorbidities Comorbidity 1;Comorbidity 2    Comorbidities CHF, CAD, DM, Fibromyalgia, MI, stroke, HTN, foot surgery, coronary stent.    Examination-Activity Limitations Other;Reach Overhead    Stability/Clinical Decision Making Stable/Uncomplicated    Rehab Potential Good    PT Frequency 2x / week    PT Duration 6 weeks    PT Treatment/Interventions ADLs/Self Care Home Management;Cryotherapy;Electrical Stimulation;Moist Heat;Functional mobility training;Therapeutic activities;Therapeutic exercise;Neuromuscular re-education;Manual techniques;Patient/family education;Passive range of motion;Vasopneumatic Device    PT Next Visit Plan progress per protocol.  Begin with gentle right shoulder P/AROM.  Vasopneumatic with pillow between thorax and right elbow.    Consulted and Agree with Plan of Care Patient             Patient will benefit from skilled therapeutic intervention in order to improve the following deficits and impairments:  Decreased activity tolerance, Decreased range of motion, Pain,  Increased edema  Visit Diagnosis: Chronic right shoulder pain  Stiffness of right shoulder, not elsewhere classified     Problem List Patient  Active Problem List   Diagnosis Date Noted   Hypomagnesemia    Status post coronary artery stent placement    Chest pain 02/21/2020   Hypokalemia    Hyperlipidemia with target LDL less than 70    Angina pectoris (HCC) 02/20/2020   Chronic systolic heart failure (HCC) 02/12/2020   Coronary artery disease with exertional angina (HCC)    Cerebritis 12/06/2019   Cutaneous abscess of left foot    Diabetic foot (HCC)    Acute systolic heart failure (HCC)    Embolic infarction (HCC)    Hyperglycemia    Hypoxia    Lower respiratory tract infection due to COVID-19 virus 10/10/2019   Benign hypertension 07/23/2015   Gastro-esophageal reflux disease with esophagitis 07/23/2015   Gout 07/23/2015   Obstructive sleep apnea 07/23/2015   Snoring 07/23/2015   Chronic idiopathic gout of multiple sites 06/02/2015   Class 2 obesity in adult 06/02/2015   Fibromyalgia 06/02/2015   Irritable bowel syndrome with diarrhea 06/02/2015   Mild persistent asthma 06/02/2015   Reaction, adjustment, with depressed mood, prolonged 06/02/2015   Type 2 diabetes mellitus without complication, without long-term current use of insulin (HCC) 06/02/2015    Lydell Moga,CHRIS, PTA 11/21/2020, 9:12 AM  [facility] [address] [place], [place], [address] Phone: [phone]   Fax:  [phone]  Name: Micheal Bell MRN: [id number] Date of Birth: 1970-09-26

## 2020-11-22 ENCOUNTER — Other Ambulatory Visit: Payer: Self-pay | Admitting: Neurology

## 2020-11-25 ENCOUNTER — Other Ambulatory Visit: Payer: Self-pay | Admitting: Cardiovascular Disease

## 2020-11-25 DIAGNOSIS — Z96611 Presence of right artificial shoulder joint: Secondary | ICD-10-CM | POA: Diagnosis not present

## 2020-11-25 DIAGNOSIS — Z471 Aftercare following joint replacement surgery: Secondary | ICD-10-CM | POA: Diagnosis not present

## 2020-11-26 ENCOUNTER — Ambulatory Visit: Payer: Medicaid Other | Attending: Surgical | Admitting: *Deleted

## 2020-11-26 ENCOUNTER — Other Ambulatory Visit: Payer: Self-pay

## 2020-11-26 DIAGNOSIS — M25511 Pain in right shoulder: Secondary | ICD-10-CM | POA: Diagnosis not present

## 2020-11-26 DIAGNOSIS — G8929 Other chronic pain: Secondary | ICD-10-CM | POA: Diagnosis not present

## 2020-11-26 DIAGNOSIS — M25611 Stiffness of right shoulder, not elsewhere classified: Secondary | ICD-10-CM | POA: Insufficient documentation

## 2020-11-26 NOTE — Therapy (Signed)
[name] [address] [place], [place], [address] Phone: [phone]   Fax:  [phone]  Physical Therapy Treatment  Patient Details  Name: Micheal Bell MRN: [id number] Date of Birth: 1970/03/10 Referring Provider (PT): Amber Portfield PA-C   Encounter Date: 11/26/2020   PT End of Session - 11/26/20 0836     Visit Number 5    Number of Visits 12    Date for PT Re-Evaluation 12/26/20    PT Start Time 0815    PT Stop Time 0905    PT Time Calculation (min) 50 min             Past Medical History:  Diagnosis Date   Anxiety    Arthritis    Asthma    inhaler daily   CHF (congestive heart failure) (HCC)    Coronary artery disease    Depression    Diabetes mellitus without complication (HCC)    DVT (deep venous thrombosis) (HCC)    Dyspnea    Fibromyalgia    Gout    History of kidney stones    Hypertension    IBS (irritable bowel syndrome)    Myocardial infarction (HCC) 09/2019   Pneumonia 09/2019   Stroke (HCC) 09/2019   Vein disorder     Past Surgical History:  Procedure Laterality Date   CORONARY ATHERECTOMY N/A 02/20/2020   Procedure: CORONARY ATHERECTOMY;  Surgeon: [name], [name], MD;  Location: [facility] INVASIVE CV LAB;  Service: Cardiovascular;  Laterality: N/A;  CSI LAD   CORONARY STENT INTERVENTION N/A 02/20/2020   Procedure: CORONARY STENT INTERVENTION;  Surgeon: [name], [name], MD;  Location: [facility] INVASIVE CV LAB;  Service: Cardiovascular;  Laterality: N/A;  LAD CFX   FOOT SURGERY     INTRAVASCULAR ULTRASOUND/IVUS N/A 02/20/2020   Procedure: Intravascular Ultrasound/IVUS;  Surgeon: [name], [name], MD;  Location: [facility] INVASIVE CV LAB;  Service: Cardiovascular;  Laterality: N/A;   LEFT HEART CATH AND CORONARY ANGIOGRAPHY N/A 02/12/2020   Procedure: LEFT HEART CATH AND CORONARY ANGIOGRAPHY;  Surgeon: [name], MD;  Location: [facility] INVASIVE CV LAB;  Service: Cardiovascular;  Laterality: N/A;   REVERSE  SHOULDER ARTHROPLASTY Right 10/17/2020   Procedure: REVERSE SHOULDER ARTHROPLASTY;  Surgeon: Jones Broom, MD;  Location: [facility];  Service: Orthopedics;  Laterality: Right;   TEE WITHOUT CARDIOVERSION N/A 10/24/2019   Procedure: TRANSESOPHAGEAL ECHOCARDIOGRAM (TEE);  Surgeon: [name], MD;  Location: [facility] ENDOSCOPY;  Service: Cardiovascular;  Laterality: N/A;    There were no vitals filed for this visit.   Subjective Assessment - 11/26/20 0834     Subjective COVID-19 screen performed prior to patient entering clinic. Rpeorts he has been using RUE shoulder.4-5/10 pain today    Pertinent History CHF, CAD, DM, Fibromyalgia, MI, stroke, HTN, foot surgery, coronary stent.    Patient Stated Goals Would like to use right UE functionally with less pain.    Currently in Pain? Yes    Pain Score 4     Pain Location Arm    Pain Orientation Right    Pain Type Surgical pain    Pain Onset 1 to 4 weeks ago                               [facility] Adult PT Treatment/Exercise - 11/26/20 0001       Exercises   Exercises Shoulder      Shoulder Exercises: Standing   Other Standing  Exercises wall ladder x 3    Other Standing Exercises Blue ball on raised mat table RT UE all motions      Shoulder Exercises: Pulleys   Flexion 3 minutes    Other Pulley Exercises seated UE ranger x 5 mins all motions      Modalities   Modalities Vasopneumatic      Vasopneumatic   Number Minutes Vasopneumatic  10 minutes    Vasopnuematic Location  Shoulder    Vasopneumatic Pressure Low    Vasopneumatic Temperature  34/pain      Manual Therapy   Manual Therapy Passive ROM    Passive ROM AAROM for ER and elevation as well as rhythmic stab.                          PT Long Term Goals - 11/07/20 1557       PT LONG TERM GOAL #1   Title Independent with a HEP.    Baseline No knowledge of appropriate ther ex.    Time 6    Period Weeks    Status New      PT LONG TERM  GOAL #2   Title Active right shoulder flexion to 135 degrees so the patient can easily reach overhead.    Baseline P/Arom to 90 degrees.    Time 6    Period Weeks    Status New      PT LONG TERM GOAL #3   Title Active ER to 70 degrees+ to allow for easily donning/doffing of apparel.    Baseline 10 degrees.    Time 6    Period Weeks    Status New      PT LONG TERM GOAL #4   Title Increase right shoulder strength to a solid 4 to 4+/5 to increase stability for performance of functional activities.    Baseline NT    Time 6    Period Weeks    Status New      PT LONG TERM GOAL #5   Title Perform ADL's with pain not > 3/10.    Baseline High pain with movement of right shoulder.    Time 6    Period Weeks    Status New                   Plan - 11/26/20 0837     Clinical Impression Statement Pt arrived today doing better and was able to progress to AROM  and AAROM for RT shldr today and did fairly well with low pain levels. AROM is still very challenging for elevation and ER. He also started isometrics for flexion and extension. Rx tolerated well    Personal Factors and Comorbidities Comorbidity 1;Comorbidity 2    Comorbidities CHF, CAD, DM, Fibromyalgia, MI, stroke, HTN, foot surgery, coronary stent.    Examination-Activity Limitations Other;Reach Overhead    Examination-Participation Restrictions Other    Rehab Potential Good    PT Frequency 2x / week    PT Treatment/Interventions ADLs/Self Care Home Management;Cryotherapy;Electrical Stimulation;Moist Heat;Functional mobility training;Therapeutic activities;Therapeutic exercise;Neuromuscular re-education;Manual techniques;Patient/family education;Passive range of motion;Vasopneumatic Device    PT Next Visit Plan See protocol.    6 weeks post-op 11-28-20 .  Vasopneumatic with pillow between thorax and right elbow.             Patient will benefit from skilled therapeutic intervention in order to improve the following  deficits and impairments:     Visit Diagnosis:  Chronic right shoulder pain  Stiffness of right shoulder, not elsewhere classified     Problem List Patient Active Problem List   Diagnosis Date Noted   Hypomagnesemia    Status post coronary artery stent placement    Chest pain 02/21/2020   Hypokalemia    Hyperlipidemia with target LDL less than 70    Angina pectoris (HCC) 02/20/2020   Chronic systolic heart failure (HCC) 02/12/2020   Coronary artery disease with exertional angina (HCC)    Cerebritis 12/06/2019   Cutaneous abscess of left foot    Diabetic foot (HCC)    Acute systolic heart failure (HCC)    Embolic infarction (HCC)    Hyperglycemia    Hypoxia    Lower respiratory tract infection due to COVID-19 virus 10/10/2019   Benign hypertension 07/23/2015   Gastro-esophageal reflux disease with esophagitis 07/23/2015   Gout 07/23/2015   Obstructive sleep apnea 07/23/2015   Snoring 07/23/2015   Chronic idiopathic gout of multiple sites 06/02/2015   Class 2 obesity in adult 06/02/2015   Fibromyalgia 06/02/2015   Irritable bowel syndrome with diarrhea 06/02/2015   Mild persistent asthma 06/02/2015   Reaction, adjustment, with depressed mood, prolonged 06/02/2015   Type 2 diabetes mellitus without complication, without long-term current use of insulin (HCC) 06/02/2015    Rashaun Wichert,CHRIS, PTA 11/26/2020, 12:05 PM  [name] [address] [place], [place], [address] Phone: [phone]   Fax:  [phone]  Name: Micheal Bell MRN: [id number] Date of Birth: 07-25-1970

## 2020-11-28 ENCOUNTER — Ambulatory Visit: Payer: Medicaid Other | Admitting: Physical Therapy

## 2020-11-28 ENCOUNTER — Encounter: Payer: Self-pay | Admitting: Physical Therapy

## 2020-11-28 DIAGNOSIS — M25611 Stiffness of right shoulder, not elsewhere classified: Secondary | ICD-10-CM

## 2020-11-28 DIAGNOSIS — M25511 Pain in right shoulder: Secondary | ICD-10-CM

## 2020-11-28 DIAGNOSIS — G8929 Other chronic pain: Secondary | ICD-10-CM | POA: Diagnosis not present

## 2020-11-28 NOTE — Therapy (Signed)
[name] [address] [place], [place], [address] Phone: [phone]   Fax:  [phone]  Physical Therapy Treatment  Patient Details  Name: Micheal Bell MRN: [id number] Date of Birth: [birth year] Referring Provider (PT): Amber Portfield PA-C   Encounter Date: 11/28/2020   PT End of Session - 11/28/20 0819     Visit Number 6    Number of Visits 12    Date for PT Re-Evaluation 12/26/20    PT Start Time 0819    PT Stop Time 0855    PT Time Calculation (min) 36 min    Activity Tolerance Patient tolerated treatment well    Behavior During Therapy [facility] for tasks assessed/performed             Past Medical History:  Diagnosis Date   Anxiety    Arthritis    Asthma    inhaler daily   CHF (congestive heart failure) (HCC)    Coronary artery disease    Depression    Diabetes mellitus without complication (HCC)    DVT (deep venous thrombosis) (HCC)    Dyspnea    Fibromyalgia    Gout    History of kidney stones    Hypertension    IBS (irritable bowel syndrome)    Myocardial infarction (HCC) 09/2019   Pneumonia 09/2019   Stroke (HCC) 09/2019   Vein disorder     Past Surgical History:  Procedure Laterality Date   CORONARY ATHERECTOMY N/A 02/20/2020   Procedure: CORONARY ATHERECTOMY;  Surgeon: [name], [name], MD;  Location: [facility] INVASIVE CV LAB;  Service: Cardiovascular;  Laterality: N/A;  CSI LAD   CORONARY STENT INTERVENTION N/A 02/20/2020   Procedure: CORONARY STENT INTERVENTION;  Surgeon: [name], [name], MD;  Location: [facility] INVASIVE CV LAB;  Service: Cardiovascular;  Laterality: N/A;  LAD CFX   FOOT SURGERY     INTRAVASCULAR ULTRASOUND/IVUS N/A 02/20/2020   Procedure: Intravascular Ultrasound/IVUS;  Surgeon: [name], [name], MD;  Location: [facility] INVASIVE CV LAB;  Service: Cardiovascular;  Laterality: N/A;   LEFT HEART CATH AND CORONARY ANGIOGRAPHY N/A 02/12/2020   Procedure: LEFT HEART CATH AND CORONARY ANGIOGRAPHY;   Surgeon: [name], MD;  Location: [facility] INVASIVE CV LAB;  Service: Cardiovascular;  Laterality: N/A;   REVERSE SHOULDER ARTHROPLASTY Right 10/17/2020   Procedure: REVERSE SHOULDER ARTHROPLASTY;  Surgeon: Jones Broom, MD;  Location: [facility];  Service: Orthopedics;  Laterality: Right;   TEE WITHOUT CARDIOVERSION N/A 10/24/2019   Procedure: TRANSESOPHAGEAL ECHOCARDIOGRAM (TEE);  Surgeon: [name], MD;  Location: [facility] ENDOSCOPY;  Service: Cardiovascular;  Laterality: N/A;    There were no vitals filed for this visit.   Subjective Assessment - 11/28/20 0819     Subjective COVID-19 screen performed prior to patient entering clinic. Denies any pain.    Pertinent History CHF, CAD, DM, Fibromyalgia, MI, stroke, HTN, foot surgery, coronary stent.    Patient Stated Goals Would like to use right UE functionally with less pain.    Currently in Pain? No/denies                [facility] PT Assessment - 11/28/20 0001       Assessment   Medical Diagnosis Right total shoulder replacement.    Referring Provider (PT) Amber Portfield PA-C    Onset Date/Surgical Date 10/17/20    Hand Dominance Right      Precautions   Precaution Comments No ultrasound.  Progress per Dr. Veda Canning TSA protocol.  [facility] Adult PT Treatment/Exercise - 11/28/20 0001       Modalities   Modalities Vasopneumatic      Vasopneumatic   Number Minutes Vasopneumatic  10 minutes    Vasopnuematic Location  Shoulder    Vasopneumatic Pressure Low    Vasopneumatic Temperature  34/pain      Manual Therapy   Manual Therapy Passive ROM    Passive ROM P/AAROM of R shoulder into flex, ER with gentle holds at end range                          PT Long Term Goals - 11/07/20 1557       PT LONG TERM GOAL #1   Title Independent with a HEP.    Baseline No knowledge of appropriate ther ex.    Time 6    Period Weeks    Status New      PT LONG TERM GOAL #2    Title Active right shoulder flexion to 135 degrees so the patient can easily reach overhead.    Baseline P/Arom to 90 degrees.    Time 6    Period Weeks    Status New      PT LONG TERM GOAL #3   Title Active ER to 70 degrees+ to allow for easily donning/doffing of apparel.    Baseline 10 degrees.    Time 6    Period Weeks    Status New      PT LONG TERM GOAL #4   Title Increase right shoulder strength to a solid 4 to 4+/5 to increase stability for performance of functional activities.    Baseline NT    Time 6    Period Weeks    Status New      PT LONG TERM GOAL #5   Title Perform ADL's with pain not > 3/10.    Baseline High pain with movement of right shoulder.    Time 6    Period Weeks    Status New                   Plan - 11/28/20 0849     Clinical Impression Statement Patient able to tolerate P/AAROM of R shoulder well with no complaints of pain during and upon arrival. Firm end feels and smooth arc of motion noted during P/AAROM of R shoulder. Patient states that he tries to rest with his hands behind his head for stretching. Normal vasopneumatic response noted following removal of the modality.    Personal Factors and Comorbidities Comorbidity 1;Comorbidity 2    Comorbidities CHF, CAD, DM, Fibromyalgia, MI, stroke, HTN, foot surgery, coronary stent.    Examination-Activity Limitations Other;Reach Overhead    Examination-Participation Restrictions Other    Stability/Clinical Decision Making Stable/Uncomplicated    Rehab Potential Good    PT Frequency 2x / week    PT Duration 6 weeks    PT Treatment/Interventions ADLs/Self Care Home Management;Cryotherapy;Electrical Stimulation;Moist Heat;Functional mobility training;Therapeutic activities;Therapeutic exercise;Neuromuscular re-education;Manual techniques;Patient/family education;Passive range of motion;Vasopneumatic Device    PT Next Visit Plan See protocol.    6 weeks post-op 11-28-20 .  Vasopneumatic with pillow  between thorax and right elbow.    Consulted and Agree with Plan of Care Patient             Patient will benefit from skilled therapeutic intervention in order to improve the following deficits and impairments:  Decreased activity tolerance, Decreased range of motion, Pain,  Increased edema  Visit Diagnosis: Chronic right shoulder pain  Stiffness of right shoulder, not elsewhere classified     Problem List Patient Active Problem List   Diagnosis Date Noted   Hypomagnesemia    Status post coronary artery stent placement    Chest pain 02/21/2020   Hypokalemia    Hyperlipidemia with target LDL less than 70    Angina pectoris (HCC) 02/20/2020   Chronic systolic heart failure (HCC) 02/12/2020   Coronary artery disease with exertional angina (HCC)    Cerebritis 12/06/2019   Cutaneous abscess of left foot    Diabetic foot (HCC)    Acute systolic heart failure (HCC)    Embolic infarction (HCC)    Hyperglycemia    Hypoxia    Lower respiratory tract infection due to COVID-19 virus 10/10/2019   Benign hypertension 07/23/2015   Gastro-esophageal reflux disease with esophagitis 07/23/2015   Gout 07/23/2015   Obstructive sleep apnea 07/23/2015   Snoring 07/23/2015   Chronic idiopathic gout of multiple sites 06/02/2015   Class 2 obesity in adult 06/02/2015   Fibromyalgia 06/02/2015   Irritable bowel syndrome with diarrhea 06/02/2015   Mild persistent asthma 06/02/2015   Reaction, adjustment, with depressed mood, prolonged 06/02/2015   Type 2 diabetes mellitus without complication, without long-term current use of insulin (HCC) 06/02/2015    Marvell Fuller, PTA 11/28/2020, 9:12 AM  [name] [address] [place], [place], [address] Phone: [phone]   Fax:  [phone]  Name: Micheal Bell MRN: [id number] Date of Birth: January 26, 1971

## 2020-12-03 ENCOUNTER — Other Ambulatory Visit: Payer: Self-pay

## 2020-12-03 ENCOUNTER — Ambulatory Visit: Payer: Medicaid Other | Admitting: Physical Therapy

## 2020-12-03 DIAGNOSIS — E119 Type 2 diabetes mellitus without complications: Secondary | ICD-10-CM | POA: Diagnosis not present

## 2020-12-03 DIAGNOSIS — Z794 Long term (current) use of insulin: Secondary | ICD-10-CM | POA: Diagnosis not present

## 2020-12-03 DIAGNOSIS — M25511 Pain in right shoulder: Secondary | ICD-10-CM | POA: Diagnosis not present

## 2020-12-03 DIAGNOSIS — G8929 Other chronic pain: Secondary | ICD-10-CM

## 2020-12-03 DIAGNOSIS — E1165 Type 2 diabetes mellitus with hyperglycemia: Secondary | ICD-10-CM | POA: Diagnosis not present

## 2020-12-03 DIAGNOSIS — E1140 Type 2 diabetes mellitus with diabetic neuropathy, unspecified: Secondary | ICD-10-CM | POA: Diagnosis not present

## 2020-12-03 DIAGNOSIS — Z7984 Long term (current) use of oral hypoglycemic drugs: Secondary | ICD-10-CM | POA: Diagnosis not present

## 2020-12-03 DIAGNOSIS — M25611 Stiffness of right shoulder, not elsewhere classified: Secondary | ICD-10-CM | POA: Diagnosis not present

## 2020-12-03 NOTE — Therapy (Signed)
[name] [name] [place], [place], [address] Phone: [phone]   Fax:  [phone]  Physical Therapy Treatment  Patient Details  Name: Micheal Bell MRN: [id number] Date of Birth: [birth year] Referring Provider (PT): Amber Portfield PA-C   Encounter Date: 12/03/2020   PT End of Session - 12/03/20 3785     Visit Number 7    Number of Visits 12    Date for PT Re-Evaluation 12/26/20    PT Start Time 0818    PT Stop Time 0855    PT Time Calculation (min) 37 min    Activity Tolerance Patient tolerated treatment well    Behavior During Therapy [facility] for tasks assessed/performed             Past Medical History:  Diagnosis Date   Anxiety    Arthritis    Asthma    inhaler daily   CHF (congestive heart failure) (HCC)    Coronary artery disease    Depression    Diabetes mellitus without complication (HCC)    DVT (deep venous thrombosis) (HCC)    Dyspnea    Fibromyalgia    Gout    History of kidney stones    Hypertension    IBS (irritable bowel syndrome)    Myocardial infarction (HCC) 09/2019   Pneumonia 09/2019   Stroke (HCC) 09/2019   Vein disorder     Past Surgical History:  Procedure Laterality Date   CORONARY ATHERECTOMY N/A 02/20/2020   Procedure: CORONARY ATHERECTOMY;  Surgeon: [name], [name], MD;  Location: [facility] INVASIVE CV LAB;  Service: Cardiovascular;  Laterality: N/A;  CSI LAD   CORONARY STENT INTERVENTION N/A 02/20/2020   Procedure: CORONARY STENT INTERVENTION;  Surgeon: [name], [name], MD;  Location: [facility] INVASIVE CV LAB;  Service: Cardiovascular;  Laterality: N/A;  LAD CFX   FOOT SURGERY     INTRAVASCULAR ULTRASOUND/IVUS N/A 02/20/2020   Procedure: Intravascular Ultrasound/IVUS;  Surgeon: [name], [name], MD;  Location: [facility] INVASIVE CV LAB;  Service: Cardiovascular;  Laterality: N/A;   LEFT HEART CATH AND CORONARY ANGIOGRAPHY N/A 02/12/2020   Procedure: LEFT HEART CATH AND CORONARY ANGIOGRAPHY;   Surgeon: [name], MD;  Location: [facility] INVASIVE CV LAB;  Service: Cardiovascular;  Laterality: N/A;   REVERSE SHOULDER ARTHROPLASTY Right 10/17/2020   Procedure: REVERSE SHOULDER ARTHROPLASTY;  Surgeon: Jones Broom, MD;  Location: [facility];  Service: Orthopedics;  Laterality: Right;   TEE WITHOUT CARDIOVERSION N/A 10/24/2019   Procedure: TRANSESOPHAGEAL ECHOCARDIOGRAM (TEE);  Surgeon: [name], MD;  Location: [facility] ENDOSCOPY;  Service: Cardiovascular;  Laterality: N/A;    There were no vitals filed for this visit.   Subjective Assessment - 12/03/20 0820     Subjective COVID-19 screen performed prior to patient entering clinic. Patient reported some discomfort with movement and none at rest    Pertinent History CHF, CAD, DM, Fibromyalgia, MI, stroke, HTN, foot surgery, coronary stent.    Patient Stated Goals Would like to use right UE functionally with less pain.    Currently in Pain? Yes    Pain Score 1     Pain Location Shoulder    Pain Orientation Right    Pain Descriptors / Indicators Sore    Pain Type Surgical pain    Pain Onset More than a month ago    Pain Frequency Constant    Aggravating Factors  only movement    Pain Relieving Factors rest  [facility] PT Assessment - 12/03/20 0001       PROM   PROM Assessment Site Shoulder    Right/Left Shoulder Right    Right Shoulder Flexion 115 Degrees    Right Shoulder External Rotation 45 Degrees                           [facility] Adult PT Treatment/Exercise - 12/03/20 0001       Vasopneumatic   Number Minutes Vasopneumatic  10 minutes    Vasopnuematic Location  Shoulder    Vasopneumatic Pressure Low    Vasopneumatic Temperature  34/pain      Manual Therapy   Manual Therapy Passive ROM    Manual therapy comments Rythmic stabs for IR/ER in scaption and flex/ext at 90    Passive ROM P/AAROM of R shoulder into flex, ER with gentle holds at end range                           PT Long Term Goals - 12/03/20 8413       PT LONG TERM GOAL #1   Title Independent with a HEP.    Baseline No knowledge of appropriate ther ex.    Time 6    Period Weeks    Status On-going      PT LONG TERM GOAL #2   Title Active right shoulder flexion to 135 degrees so the patient can easily reach overhead.    Baseline P/Arom to 90 degrees.    Time 6    Period Weeks    Status On-going      PT LONG TERM GOAL #3   Title Active ER to 70 degrees+ to allow for easily donning/doffing of apparel.    Baseline 10 degrees.    Time 6    Period Weeks    Status On-going      PT LONG TERM GOAL #4   Title Increase right shoulder strength to a solid 4 to 4+/5 to increase stability for performance of functional activities.    Baseline NT    Time 6    Period Weeks    Status On-going      PT LONG TERM GOAL #5   Title Perform ADL's with pain not > 3/10.    Time 6    Period Weeks    Status On-going                   Plan - 12/03/20 0845     Clinical Impression Statement Patient tolerated treatment well today. Patient progressing with P/AAROM with improved range for flexion and ER today. Patient has little soreness overall and only some discomfort with movement that eases off after a few min of motion. Patient has reported being able to move arm with greater ease and is careful with light ADL's. goals progressing today. Normal response to VASO.    Personal Factors and Comorbidities Comorbidity 1;Comorbidity 2    Comorbidities CHF, CAD, DM, Fibromyalgia, MI, stroke, HTN, foot surgery, coronary stent.    Examination-Activity Limitations Other;Reach Overhead    Examination-Participation Restrictions Other    Stability/Clinical Decision Making Stable/Uncomplicated    Rehab Potential Good    PT Frequency 2x / week    PT Duration 6 weeks    PT Treatment/Interventions ADLs/Self Care Home Management;Cryotherapy;Electrical Stimulation;Moist Heat;Functional  mobility training;Therapeutic activities;Therapeutic exercise;Neuromuscular re-education;Manual techniques;Patient/family education;Passive range of motion;Vasopneumatic Device    PT Next Visit  Plan See protocol.    6 weeks post-op 11-28-20 .  Vasopneumatic with pillow between thorax and right elbow.             Patient will benefit from skilled therapeutic intervention in order to improve the following deficits and impairments:  Decreased activity tolerance, Decreased range of motion, Pain, Increased edema  Visit Diagnosis: Chronic right shoulder pain  Stiffness of right shoulder, not elsewhere classified     Problem List Patient Active Problem List   Diagnosis Date Noted   Hypomagnesemia    Status post coronary artery stent placement    Chest pain 02/21/2020   Hypokalemia    Hyperlipidemia with target LDL less than 70    Angina pectoris (HCC) 02/20/2020   Chronic systolic heart failure (HCC) 02/12/2020   Coronary artery disease with exertional angina (HCC)    Cerebritis 12/06/2019   Cutaneous abscess of left foot    Diabetic foot (HCC)    Acute systolic heart failure (HCC)    Embolic infarction (HCC)    Hyperglycemia    Hypoxia    Lower respiratory tract infection due to COVID-19 virus 10/10/2019   Benign hypertension 07/23/2015   Gastro-esophageal reflux disease with esophagitis 07/23/2015   Gout 07/23/2015   Obstructive sleep apnea 07/23/2015   Snoring 07/23/2015   Chronic idiopathic gout of multiple sites 06/02/2015   Class 2 obesity in adult 06/02/2015   Fibromyalgia 06/02/2015   Irritable bowel syndrome with diarrhea 06/02/2015   Mild persistent asthma 06/02/2015   Reaction, adjustment, with depressed mood, prolonged 06/02/2015   Type 2 diabetes mellitus without complication, without long-term current use of insulin (HCC) 06/02/2015    Kerin Kren P, PTA 12/03/2020, 9:00 AM  [name] [address] [place], [place], [address] Phone: [phone]   Fax:  [phone]  Name: Micheal Bell MRN: [id number] Date of Birth: Oct 04, 1970

## 2020-12-05 ENCOUNTER — Other Ambulatory Visit: Payer: Self-pay

## 2020-12-05 ENCOUNTER — Ambulatory Visit: Payer: Medicaid Other | Admitting: *Deleted

## 2020-12-05 DIAGNOSIS — G8929 Other chronic pain: Secondary | ICD-10-CM | POA: Diagnosis not present

## 2020-12-05 DIAGNOSIS — M25611 Stiffness of right shoulder, not elsewhere classified: Secondary | ICD-10-CM | POA: Diagnosis not present

## 2020-12-05 DIAGNOSIS — M25511 Pain in right shoulder: Secondary | ICD-10-CM | POA: Diagnosis not present

## 2020-12-05 NOTE — Therapy (Signed)
[name] [address] [place], [place], [address] Phone: [phone]   Fax:  [phone]  Physical Therapy Treatment  Patient Details  Name: Micheal BELLAND MRN: [id number] Date of Birth: July 26, 1970 Referring Provider (PT): Amber Portfield PA-C   Encounter Date: 12/05/2020   PT End of Session - 12/05/20 0830     Visit Number 8    Number of Visits 12    Date for PT Re-Evaluation 12/26/20    PT Start Time 0817    PT Stop Time 0908    PT Time Calculation (min) 51 min             Past Medical History:  Diagnosis Date   Anxiety    Arthritis    Asthma    inhaler daily   CHF (congestive heart failure) (HCC)    Coronary artery disease    Depression    Diabetes mellitus without complication (HCC)    DVT (deep venous thrombosis) (HCC)    Dyspnea    Fibromyalgia    Gout    History of kidney stones    Hypertension    IBS (irritable bowel syndrome)    Myocardial infarction (HCC) 09/2019   Pneumonia 09/2019   Stroke (HCC) 09/2019   Vein disorder     Past Surgical History:  Procedure Laterality Date   CORONARY ATHERECTOMY N/A 02/20/2020   Procedure: CORONARY ATHERECTOMY;  Surgeon: [name], [name], MD;  Location: [facility] INVASIVE CV LAB;  Service: Cardiovascular;  Laterality: N/A;  CSI LAD   CORONARY STENT INTERVENTION N/A 02/20/2020   Procedure: CORONARY STENT INTERVENTION;  Surgeon: [name], [name], MD;  Location: [facility] INVASIVE CV LAB;  Service: Cardiovascular;  Laterality: N/A;  LAD CFX   FOOT SURGERY     INTRAVASCULAR ULTRASOUND/IVUS N/A 02/20/2020   Procedure: Intravascular Ultrasound/IVUS;  Surgeon: [name], [name], MD;  Location: [facility] INVASIVE CV LAB;  Service: Cardiovascular;  Laterality: N/A;   LEFT HEART CATH AND CORONARY ANGIOGRAPHY N/A 02/12/2020   Procedure: LEFT HEART CATH AND CORONARY ANGIOGRAPHY;  Surgeon: [name], MD;  Location: [facility] INVASIVE CV LAB;  Service: Cardiovascular;  Laterality: N/A;   REVERSE  SHOULDER ARTHROPLASTY Right 10/17/2020   Procedure: REVERSE SHOULDER ARTHROPLASTY;  Surgeon: Jones Broom, MD;  Location: [facility];  Service: Orthopedics;  Laterality: Right;   TEE WITHOUT CARDIOVERSION N/A 10/24/2019   Procedure: TRANSESOPHAGEAL ECHOCARDIOGRAM (TEE);  Surgeon: [name], MD;  Location: [facility] ENDOSCOPY;  Service: Cardiovascular;  Laterality: N/A;    There were no vitals filed for this visit.   Subjective Assessment - 12/05/20 0824     Subjective COVID-19 screen performed prior to patient entering clinic. Patient reported some discomfort with movement and none at rest 4/10 today    Pertinent History CHF, CAD, DM, Fibromyalgia, MI, stroke, HTN, foot surgery, coronary stent.    Patient Stated Goals Would like to use right UE functionally with less pain.    Currently in Pain? Yes    Pain Score 4     Pain Location Shoulder    Pain Orientation Right    Pain Descriptors / Indicators Sore    Pain Type Surgical pain    Pain Onset More than a month ago                               [facility] Adult PT Treatment/Exercise - 12/05/20 0001       Exercises   Exercises Shoulder  Shoulder Exercises: Standing   Other Standing Exercises Blue ball on raised mat table RT UE all motions and then elevation pushing ball up on wedge      Shoulder Exercises: Pulleys   Flexion 5 minutes    Other Pulley Exercises seated UE ranger x 5 mins all motions      Modalities   Modalities Vasopneumatic      Vasopneumatic   Number Minutes Vasopneumatic  10 minutes    Vasopnuematic Location  Shoulder    Vasopneumatic Pressure Low    Vasopneumatic Temperature  34/pain      Manual Therapy   Manual Therapy Passive ROM    Passive ROM P/AAROM of R shoulder into flex, ER with gentle holds at end range. 3x10 AAROM for flexion                          PT Long Term Goals - 12/03/20 0177       PT LONG TERM GOAL #1   Title Independent with a HEP.     Baseline No knowledge of appropriate ther ex.    Time 6    Period Weeks    Status On-going      PT LONG TERM GOAL #2   Title Active right shoulder flexion to 135 degrees so the patient can easily reach overhead.    Baseline P/Arom to 90 degrees.    Time 6    Period Weeks    Status On-going      PT LONG TERM GOAL #3   Title Active ER to 70 degrees+ to allow for easily donning/doffing of apparel.    Baseline 10 degrees.    Time 6    Period Weeks    Status On-going      PT LONG TERM GOAL #4   Title Increase right shoulder strength to a solid 4 to 4+/5 to increase stability for performance of functional activities.    Baseline NT    Time 6    Period Weeks    Status On-going      PT LONG TERM GOAL #5   Title Perform ADL's with pain not > 3/10.    Time 6    Period Weeks    Status On-going                   Plan - 12/05/20 0831     Clinical Impression Statement Pt arrived today doing better functionally at home with ADL's with RT UE. Rx focused on ROM as well as mm activation with AROM/ AAROM exs and manual assistance. Pt reports that his MD stated that he may not be able to Raise RT arm due to mm atrophy. Normal Vaso.    Personal Factors and Comorbidities Comorbidity 1;Comorbidity 2    Comorbidities CHF, CAD, DM, Fibromyalgia, MI, stroke, HTN, foot surgery, coronary stent.    Examination-Activity Limitations Other;Reach Overhead    Examination-Participation Restrictions Other    Stability/Clinical Decision Making Stable/Uncomplicated    Rehab Potential Good    PT Frequency 2x / week    PT Duration 6 weeks    PT Treatment/Interventions ADLs/Self Care Home Management;Cryotherapy;Electrical Stimulation;Moist Heat;Functional mobility training;Therapeutic activities;Therapeutic exercise;Neuromuscular re-education;Manual techniques;Patient/family education;Passive range of motion;Vasopneumatic Device    PT Next Visit Plan See protocol.    6 weeks post-op 11-28-20 .   Vasopneumatic with pillow between thorax and right elbow.    Consulted and Agree with Plan of Care Patient  Patient will benefit from skilled therapeutic intervention in order to improve the following deficits and impairments:  Decreased activity tolerance, Decreased range of motion, Pain, Increased edema  Visit Diagnosis: Chronic right shoulder pain  Stiffness of right shoulder, not elsewhere classified     Problem List Patient Active Problem List   Diagnosis Date Noted   Hypomagnesemia    Status post coronary artery stent placement    Chest pain 02/21/2020   Hypokalemia    Hyperlipidemia with target LDL less than 70    Angina pectoris (HCC) 02/20/2020   Chronic systolic heart failure (HCC) 02/12/2020   Coronary artery disease with exertional angina (HCC)    Cerebritis 12/06/2019   Cutaneous abscess of left foot    Diabetic foot (HCC)    Acute systolic heart failure (HCC)    Embolic infarction (HCC)    Hyperglycemia    Hypoxia    Lower respiratory tract infection due to COVID-19 virus 10/10/2019   Benign hypertension 07/23/2015   Gastro-esophageal reflux disease with esophagitis 07/23/2015   Gout 07/23/2015   Obstructive sleep apnea 07/23/2015   Snoring 07/23/2015   Chronic idiopathic gout of multiple sites 06/02/2015   Class 2 obesity in adult 06/02/2015   Fibromyalgia 06/02/2015   Irritable bowel syndrome with diarrhea 06/02/2015   Mild persistent asthma 06/02/2015   Reaction, adjustment, with depressed mood, prolonged 06/02/2015   Type 2 diabetes mellitus without complication, without long-term current use of insulin (HCC) 06/02/2015    Brittanni Cariker,CHRIS, PTA 12/05/2020, 9:13 AM  [facility] [address] [place], [place], [address] Phone: [phone]   Fax:  [phone]  Name: LUVERN MCISAAC MRN: [id number] Date of Birth: 1970-04-16

## 2020-12-10 ENCOUNTER — Ambulatory Visit: Payer: Medicaid Other | Admitting: *Deleted

## 2020-12-10 ENCOUNTER — Other Ambulatory Visit: Payer: Self-pay

## 2020-12-10 DIAGNOSIS — M25611 Stiffness of right shoulder, not elsewhere classified: Secondary | ICD-10-CM | POA: Diagnosis not present

## 2020-12-10 DIAGNOSIS — M25511 Pain in right shoulder: Secondary | ICD-10-CM | POA: Diagnosis not present

## 2020-12-10 DIAGNOSIS — G8929 Other chronic pain: Secondary | ICD-10-CM | POA: Diagnosis not present

## 2020-12-10 NOTE — Therapy (Signed)
[name] [address] [place], [place], [address] Phone: [phone]   Fax:  [phone]  Physical Therapy Treatment  Patient Details  Name: Micheal Bell MRN: [id number] Date of Birth: 04-09-1970 Referring Provider (PT): Amber Portfield PA-C   Encounter Date: 12/10/2020   PT End of Session - 12/10/20 0857     Visit Number 9    Number of Visits 12    Date for PT Re-Evaluation 12/26/20    PT Start Time 0815    PT Stop Time 0901    PT Time Calculation (min) 46 min             Past Medical History:  Diagnosis Date   Anxiety    Arthritis    Asthma    inhaler daily   CHF (congestive heart failure) (HCC)    Coronary artery disease    Depression    Diabetes mellitus without complication (HCC)    DVT (deep venous thrombosis) (HCC)    Dyspnea    Fibromyalgia    Gout    History of kidney stones    Hypertension    IBS (irritable bowel syndrome)    Myocardial infarction (HCC) 09/2019   Pneumonia 09/2019   Stroke (HCC) 09/2019   Vein disorder     Past Surgical History:  Procedure Laterality Date   CORONARY ATHERECTOMY N/A 02/20/2020   Procedure: CORONARY ATHERECTOMY;  Surgeon: [name], [name], MD;  Location: [facility] INVASIVE CV LAB;  Service: Cardiovascular;  Laterality: N/A;  CSI LAD   CORONARY STENT INTERVENTION N/A 02/20/2020   Procedure: CORONARY STENT INTERVENTION;  Surgeon: [name], [name], MD;  Location: [facility] INVASIVE CV LAB;  Service: Cardiovascular;  Laterality: N/A;  LAD CFX   FOOT SURGERY     INTRAVASCULAR ULTRASOUND/IVUS N/A 02/20/2020   Procedure: Intravascular Ultrasound/IVUS;  Surgeon: [name], [name], MD;  Location: [facility] INVASIVE CV LAB;  Service: Cardiovascular;  Laterality: N/A;   LEFT HEART CATH AND CORONARY ANGIOGRAPHY N/A 02/12/2020   Procedure: LEFT HEART CATH AND CORONARY ANGIOGRAPHY;  Surgeon: [name], MD;  Location: [facility] INVASIVE CV LAB;  Service: Cardiovascular;  Laterality: N/A;   REVERSE  SHOULDER ARTHROPLASTY Right 10/17/2020   Procedure: REVERSE SHOULDER ARTHROPLASTY;  Surgeon: Jones Broom, MD;  Location: [facility];  Service: Orthopedics;  Laterality: Right;   TEE WITHOUT CARDIOVERSION N/A 10/24/2019   Procedure: TRANSESOPHAGEAL ECHOCARDIOGRAM (TEE);  Surgeon: [name], MD;  Location: [facility] ENDOSCOPY;  Service: Cardiovascular;  Laterality: N/A;    There were no vitals filed for this visit.   Subjective Assessment - 12/10/20 0829     Subjective COVID-19 screen performed prior to patient entering clinic. Patient reported some discomfort with movement and none at rest 3- 4/10 today    Pertinent History CHF, CAD, DM, Fibromyalgia, MI, stroke, HTN, foot surgery, coronary stent.    Patient Stated Goals Would like to use right UE functionally with less pain.    Pain Score 3     Pain Orientation Right    Pain Descriptors / Indicators Sore    Pain Type Surgical pain                               [facility] Adult PT Treatment/Exercise - 12/10/20 0001       Shoulder Exercises: Standing   Other Standing Exercises Blue ball on raised mat table RT UE all motions x5 mins and then elevation pushing ball up on wedge  5x10      Shoulder Exercises: Pulleys   Flexion 5 minutes    Other Pulley Exercises --      Modalities   Modalities Vasopneumatic      Vasopneumatic   Number Minutes Vasopneumatic  10 minutes    Vasopnuematic Location  Shoulder    Vasopneumatic Pressure Low    Vasopneumatic Temperature  34/pain      Manual Therapy   Manual Therapy Passive ROM    Passive ROM P/AAROM of R shoulder into flex, ER with gentle holds at end range. 3x10 AAROM for flexion with holds at 90 degrees. Manual isometrics for flexion and extension                          PT Long Term Goals - 12/03/20 0865       PT LONG TERM GOAL #1   Title Independent with a HEP.    Baseline No knowledge of appropriate ther ex.    Time 6    Period Weeks    Status  On-going      PT LONG TERM GOAL #2   Title Active right shoulder flexion to 135 degrees so the patient can easily reach overhead.    Baseline P/Arom to 90 degrees.    Time 6    Period Weeks    Status On-going      PT LONG TERM GOAL #3   Title Active ER to 70 degrees+ to allow for easily donning/doffing of apparel.    Baseline 10 degrees.    Time 6    Period Weeks    Status On-going      PT LONG TERM GOAL #4   Title Increase right shoulder strength to a solid 4 to 4+/5 to increase stability for performance of functional activities.    Baseline NT    Time 6    Period Weeks    Status On-going      PT LONG TERM GOAL #5   Title Perform ADL's with pain not > 3/10.    Time 6    Period Weeks    Status On-going                   Plan - 12/10/20 7846     Clinical Impression Statement Pt  arrived today doing fairly well after last Rx. Again Rx focused on RT UE AAROM, AROM and manual isometrics. Pt will be 8 weeks post-op 12-12-20.    Comorbidities CHF, CAD, DM, Fibromyalgia, MI, stroke, HTN, foot surgery, coronary stent.    Stability/Clinical Decision Making Stable/Uncomplicated    Rehab Potential Good    PT Frequency 2x / week    PT Duration 6 weeks    PT Treatment/Interventions ADLs/Self Care Home Management;Cryotherapy;Electrical Stimulation;Moist Heat;Functional mobility training;Therapeutic activities;Therapeutic exercise;Neuromuscular re-education;Manual techniques;Patient/family education;Passive range of motion;Vasopneumatic Device    PT Next Visit Plan See protocol.    8 weeks post-op 12-12-20 .  Vasopneumatic with pillow between thorax and right elbow.             Patient will benefit from skilled therapeutic intervention in order to improve the following deficits and impairments:  Decreased activity tolerance, Decreased range of motion, Pain, Increased edema  Visit Diagnosis: Chronic right shoulder pain  Stiffness of right shoulder, not elsewhere  classified     Problem List Patient Active Problem List   Diagnosis Date Noted   Hypomagnesemia    Status post coronary artery stent placement  Chest pain 02/21/2020   Hypokalemia    Hyperlipidemia with target LDL less than 70    Angina pectoris (HCC) 02/20/2020   Chronic systolic heart failure (HCC) 02/12/2020   Coronary artery disease with exertional angina (HCC)    Cerebritis 12/06/2019   Cutaneous abscess of left foot    Diabetic foot (HCC)    Acute systolic heart failure (HCC)    Embolic infarction (HCC)    Hyperglycemia    Hypoxia    Lower respiratory tract infection due to COVID-19 virus 10/10/2019   Benign hypertension 07/23/2015   Gastro-esophageal reflux disease with esophagitis 07/23/2015   Gout 07/23/2015   Obstructive sleep apnea 07/23/2015   Snoring 07/23/2015   Chronic idiopathic gout of multiple sites 06/02/2015   Class 2 obesity in adult 06/02/2015   Fibromyalgia 06/02/2015   Irritable bowel syndrome with diarrhea 06/02/2015   Mild persistent asthma 06/02/2015   Reaction, adjustment, with depressed mood, prolonged 06/02/2015   Type 2 diabetes mellitus without complication, without long-term current use of insulin (HCC) 06/02/2015    Robertson Colclough,CHRIS, PTA 12/10/2020, 9:42 AM  [name] [address] [place], [place], [address] Phone: [phone]   Fax:  [phone]  Name: JARIOUS LYON MRN: [id number] Date of Birth: 12/21/1970

## 2020-12-12 ENCOUNTER — Other Ambulatory Visit: Payer: Self-pay

## 2020-12-12 ENCOUNTER — Ambulatory Visit: Payer: Medicaid Other | Admitting: *Deleted

## 2020-12-12 ENCOUNTER — Encounter: Payer: Self-pay | Admitting: *Deleted

## 2020-12-12 DIAGNOSIS — M25611 Stiffness of right shoulder, not elsewhere classified: Secondary | ICD-10-CM | POA: Diagnosis not present

## 2020-12-12 DIAGNOSIS — M25511 Pain in right shoulder: Secondary | ICD-10-CM | POA: Diagnosis not present

## 2020-12-12 DIAGNOSIS — G8929 Other chronic pain: Secondary | ICD-10-CM | POA: Diagnosis not present

## 2020-12-12 NOTE — Therapy (Signed)
[name] [address] [place], [place], [address] Phone: [phone]   Fax:  [phone]  Physical Therapy Treatment  Patient Details  Name: Micheal Bell MRN: [id number] Date of Birth: 1970/09/17 Referring Provider (PT): Amber Portfield PA-C   Encounter Date: 12/12/2020   PT End of Session - 12/12/20 0840     Visit Number 10    Number of Visits 12    Date for PT Re-Evaluation 12/26/20    PT Start Time 0815    PT Stop Time 0902    PT Time Calculation (min) 47 min             Past Medical History:  Diagnosis Date   Anxiety    Arthritis    Asthma    inhaler daily   CHF (congestive heart failure) (HCC)    Coronary artery disease    Depression    Diabetes mellitus without complication (HCC)    DVT (deep venous thrombosis) (HCC)    Dyspnea    Fibromyalgia    Gout    History of kidney stones    Hypertension    IBS (irritable bowel syndrome)    Myocardial infarction (HCC) 09/2019   Pneumonia 09/2019   Stroke (HCC) 09/2019   Vein disorder     Past Surgical History:  Procedure Laterality Date   CORONARY ATHERECTOMY N/A 02/20/2020   Procedure: CORONARY ATHERECTOMY;  Surgeon: [name], [name], MD;  Location: [facility] INVASIVE CV LAB;  Service: Cardiovascular;  Laterality: N/A;  CSI LAD   CORONARY STENT INTERVENTION N/A 02/20/2020   Procedure: CORONARY STENT INTERVENTION;  Surgeon: [name], [name], MD;  Location: [facility] INVASIVE CV LAB;  Service: Cardiovascular;  Laterality: N/A;  LAD CFX   FOOT SURGERY     INTRAVASCULAR ULTRASOUND/IVUS N/A 02/20/2020   Procedure: Intravascular Ultrasound/IVUS;  Surgeon: [name], [name], MD;  Location: [facility] INVASIVE CV LAB;  Service: Cardiovascular;  Laterality: N/A;   LEFT HEART CATH AND CORONARY ANGIOGRAPHY N/A 02/12/2020   Procedure: LEFT HEART CATH AND CORONARY ANGIOGRAPHY;  Surgeon: [name], MD;  Location: [facility] INVASIVE CV LAB;  Service: Cardiovascular;  Laterality: N/A;   REVERSE  SHOULDER ARTHROPLASTY Right 10/17/2020   Procedure: REVERSE SHOULDER ARTHROPLASTY;  Surgeon: Jones Broom, MD;  Location: [facility] ORS;  Service: Orthopedics;  Laterality: Right;   TEE WITHOUT CARDIOVERSION N/A 10/24/2019   Procedure: TRANSESOPHAGEAL ECHOCARDIOGRAM (TEE);  Surgeon: [name], MD;  Location: [facility] ENDOSCOPY;  Service: Cardiovascular;  Laterality: N/A;    There were no vitals filed for this visit.   Subjective Assessment - 12/12/20 0839     Subjective COVID-19 screen performed prior to patient entering clinic. Patient reported some discomfort with movement and none at rest 3/10 today.  11-14 22 to MD    Pertinent History CHF, CAD, DM, Fibromyalgia, MI, stroke, HTN, foot surgery, coronary stent.    Patient Stated Goals Would like to use right UE functionally with less pain.    Currently in Pain? Yes    Pain Score 3     Pain Location Shoulder    Pain Orientation Right    Pain Descriptors / Indicators Sore                               [facility] Adult PT Treatment/Exercise - 12/12/20 0001       Exercises   Exercises Shoulder      Shoulder Exercises: Standing   Other Standing Exercises  yellow tband 2x10 row and ext light strengthening. did well    Other Standing Exercises Blue ball on raised mat table RT UE all motions x5 mins and then elevation pushing ball up on wedge 5x10      Shoulder Exercises: Pulleys   Flexion 5 minutes    Other Pulley Exercises seated UE ranger x 5 mins all motions      Modalities   Modalities Vasopneumatic      Vasopneumatic   Number Minutes Vasopneumatic  10 minutes    Vasopnuematic Location  Shoulder    Vasopneumatic Pressure Low    Vasopneumatic Temperature  34/pain      Manual Therapy   Manual Therapy Passive ROM    Passive ROM Manual isometrics for flexion                          PT Long Term Goals - 12/03/20 1017       PT LONG TERM GOAL #1   Title Independent with a HEP.    Baseline No  knowledge of appropriate ther ex.    Time 6    Period Weeks    Status On-going      PT LONG TERM GOAL #2   Title Active right shoulder flexion to 135 degrees so the patient can easily reach overhead.    Baseline P/Arom to 90 degrees.    Time 6    Period Weeks    Status On-going      PT LONG TERM GOAL #3   Title Active ER to 70 degrees+ to allow for easily donning/doffing of apparel.    Baseline 10 degrees.    Time 6    Period Weeks    Status On-going      PT LONG TERM GOAL #4   Title Increase right shoulder strength to a solid 4 to 4+/5 to increase stability for performance of functional activities.    Baseline NT    Time 6    Period Weeks    Status On-going      PT LONG TERM GOAL #5   Title Perform ADL's with pain not > 3/10.    Time 6    Period Weeks    Status On-going                   Plan - 12/12/20 0840     Clinical Impression Statement Pt arrived today doing fairly well with RT shldr.He did well with AAROM exs and isometrics. Light strengthening with Tband for extension and rows. Fexion is still very challenging for Pt. Vaso tol. well    Personal Factors and Comorbidities Comorbidity 1;Comorbidity 2    Comorbidities CHF, CAD, DM, Fibromyalgia, MI, stroke, HTN, foot surgery, coronary stent.    Examination-Activity Limitations Other;Reach Overhead    Stability/Clinical Decision Making Stable/Uncomplicated    Rehab Potential Good    PT Frequency 2x / week    PT Duration 6 weeks    PT Treatment/Interventions ADLs/Self Care Home Management;Cryotherapy;Electrical Stimulation;Moist Heat;Functional mobility training;Therapeutic activities;Therapeutic exercise;Neuromuscular re-education;Manual techniques;Patient/family education;Passive range of motion;Vasopneumatic Device    PT Next Visit Plan See protocol.    8 weeks post-op 12-12-20 .  Vasopneumatic with pillow between thorax and right elbow.    Consulted and Agree with Plan of Care Patient              Patient will benefit from skilled therapeutic intervention in order to improve the following deficits and impairments:  Decreased activity tolerance, Decreased range of motion, Pain, Increased edema  Visit Diagnosis: Chronic right shoulder pain  Stiffness of right shoulder, not elsewhere classified     Problem List Patient Active Problem List   Diagnosis Date Noted   Hypomagnesemia    Status post coronary artery stent placement    Chest pain 02/21/2020   Hypokalemia    Hyperlipidemia with target LDL less than 70    Angina pectoris (HCC) 02/20/2020   Chronic systolic heart failure (HCC) 02/12/2020   Coronary artery disease with exertional angina (HCC)    Cerebritis 12/06/2019   Cutaneous abscess of left foot    Diabetic foot (HCC)    Acute systolic heart failure (HCC)    Embolic infarction (HCC)    Hyperglycemia    Hypoxia    Lower respiratory tract infection due to COVID-19 virus 10/10/2019   Benign hypertension 07/23/2015   Gastro-esophageal reflux disease with esophagitis 07/23/2015   Gout 07/23/2015   Obstructive sleep apnea 07/23/2015   Snoring 07/23/2015   Chronic idiopathic gout of multiple sites 06/02/2015   Class 2 obesity in adult 06/02/2015   Fibromyalgia 06/02/2015   Irritable bowel syndrome with diarrhea 06/02/2015   Mild persistent asthma 06/02/2015   Reaction, adjustment, with depressed mood, prolonged 06/02/2015   Type 2 diabetes mellitus without complication, without long-term current use of insulin (HCC) 06/02/2015    Ranell Skibinski,CHRIS, PTA 12/12/2020, 9:56 AM  [facility] [address] [place], [place], [address] Phone: [phone]   Fax:  [phone]  Name: FLOYDE DINGLEY MRN: [id number] Date of Birth: 1971/01/31

## 2020-12-17 ENCOUNTER — Other Ambulatory Visit: Payer: Self-pay

## 2020-12-17 ENCOUNTER — Ambulatory Visit: Payer: Medicaid Other | Admitting: *Deleted

## 2020-12-17 DIAGNOSIS — M25611 Stiffness of right shoulder, not elsewhere classified: Secondary | ICD-10-CM

## 2020-12-17 DIAGNOSIS — M25511 Pain in right shoulder: Secondary | ICD-10-CM | POA: Diagnosis not present

## 2020-12-17 DIAGNOSIS — G8929 Other chronic pain: Secondary | ICD-10-CM | POA: Diagnosis not present

## 2020-12-17 NOTE — Therapy (Signed)
[name] [place], [place], [address] Phone: [phone]   Fax:  [phone]  Physical Therapy Treatment  Patient Details  Name: Micheal Bell MRN: [id number] Date of Birth: 11/22/1970 Referring Provider (PT): Amber Portfield PA-C   Encounter Date: 12/17/2020   PT End of Session - 12/17/20 0827     Visit Number 11    Number of Visits 12    Date for PT Re-Evaluation 12/26/20    PT Start Time 0815             Past Medical History:  Diagnosis Date   Anxiety    Arthritis    Asthma    inhaler daily   CHF (congestive heart failure) ([place])    Coronary artery disease    Depression    Diabetes mellitus without complication ([place])    DVT (deep venous thrombosis) (Home)    Dyspnea    Fibromyalgia    Gout    History of kidney stones    Hypertension    IBS (irritable bowel syndrome)    Myocardial infarction ([place]) 09/2019   Pneumonia 09/2019   Stroke ([place]) 09/2019   Vein disorder     Past Surgical History:  Procedure Laterality Date   CORONARY ATHERECTOMY N/A 02/20/2020   Procedure: CORONARY ATHERECTOMY;  Surgeon: Martinique, [name], MD;  Location: [place] CV LAB;  Service: Cardiovascular;  Laterality: N/A;  CSI LAD   CORONARY STENT INTERVENTION N/A 02/20/2020   Procedure: CORONARY STENT INTERVENTION;  Surgeon: Martinique, [name], MD;  Location: [place] CV LAB;  Service: Cardiovascular;  Laterality: N/A;  LAD CFX   FOOT SURGERY     INTRAVASCULAR ULTRASOUND/IVUS N/A 02/20/2020   Procedure: Intravascular Ultrasound/IVUS;  Surgeon: Martinique, [name], MD;  Location: [place] CV LAB;  Service: Cardiovascular;  Laterality: N/A;   LEFT HEART CATH AND CORONARY ANGIOGRAPHY N/A 02/12/2020   Procedure: LEFT HEART CATH AND CORONARY ANGIOGRAPHY;  Surgeon: Sherren Mocha, MD;  Location: [place] CV LAB;  Service: Cardiovascular;  Laterality: N/A;   REVERSE SHOULDER ARTHROPLASTY Right 10/17/2020   Procedure: REVERSE  SHOULDER ARTHROPLASTY;  Surgeon: Tania Ade, MD;  Location: [facility];  Service: Orthopedics;  Laterality: Right;   TEE WITHOUT CARDIOVERSION N/A 10/24/2019   Procedure: TRANSESOPHAGEAL ECHOCARDIOGRAM (TEE);  Surgeon: Josue Hector, MD;  Location: [facility] ENDOSCOPY;  Service: Cardiovascular;  Laterality: N/A;    There were no vitals filed for this visit.   Subjective Assessment - 12/17/20 0826     Subjective COVID-19 screen performed prior to patient entering clinic. Patient reported some discomfort with movement and none at rest 4/10 today.  11-14 22 to MD    Pertinent History CHF, CAD, DM, Fibromyalgia, MI, stroke, HTN, foot surgery, coronary stent.    Patient Stated Goals Would like to use right UE functionally with less pain.    Currently in Pain? Yes    Pain Score 4     Pain Location Shoulder    Pain Orientation Right    Pain Descriptors / Indicators Sore    Pain Onset More than a month ago                               [facility] Adult PT Treatment/Exercise - 12/17/20 0001       Exercises   Exercises Shoulder      Shoulder Exercises: Standing   Other Standing Exercises yellow tband 2x10 row and ext  light strengthening. did well    Other Standing Exercises Blue ball on raised mat table RT UE all motions x5 mins and then elevation pushing ball up on wedge 5x10      Shoulder Exercises: Pulleys   Flexion 5 minutes      Shoulder Exercises: Stretch   Elbow Flexion Strengthening;Right;10 reps;20 reps;Bar weights/barbell   2#     Modalities   Modalities Vasopneumatic      Vasopneumatic   Number Minutes Vasopneumatic  10 minutes    Vasopnuematic Location  Shoulder    Vasopneumatic Pressure Low    Vasopneumatic Temperature  34/pain      Manual Therapy   Manual Therapy Passive ROM    Passive ROM Manual isometrics for flexion                          PT Long Term Goals - 12/17/20 0839       PT LONG TERM GOAL #1   Title Independent with a  HEP.    Time 6    Period Weeks    Status Partially Met      PT LONG TERM GOAL #2   Title Active right shoulder flexion to 135 degrees so the patient can easily reach overhead.    Baseline P/Arom to 90 degrees... MD stated to Pt that he might not be ale to raise RT arm over shldr level    Time 6    Period Weeks    Status On-going      PT LONG TERM GOAL #3   Title Active ER to 70 degrees+ to allow for easily donning/doffing of apparel.    Baseline 10 degrees.    Period Weeks    Status On-going      PT LONG TERM GOAL #4   Title Increase right shoulder strength to a solid 4 to 4+/5 to increase stability for performance of functional activities.    Status Partially Met      PT LONG TERM GOAL #5   Title Perform ADL's with pain not > 3/10.    Period Weeks    Status On-going                   Plan - 12/17/20 0829     Clinical Impression Statement Pt arrived today doing fairly well with mainly soreness. He was able to perform all AAROM and light strengthening for RT UE. Did well with all exs, but    Personal Factors and Comorbidities Comorbidity 1;Comorbidity 2    Comorbidities CHF, CAD, DM, Fibromyalgia, MI, stroke, HTN, foot surgery, coronary stent.    Examination-Activity Limitations Other;Reach Overhead    Examination-Participation Restrictions Other    Stability/Clinical Decision Making Stable/Uncomplicated    Rehab Potential Good    PT Frequency 2x / week    PT Duration 6 weeks    PT Treatment/Interventions ADLs/Self Care Home Management;Cryotherapy;Electrical Stimulation;Moist Heat;Functional mobility training;Therapeutic activities;Therapeutic exercise;Neuromuscular re-education;Manual techniques;Patient/family education;Passive range of motion;Vasopneumatic Device    PT Next Visit Plan See protocol.    8 weeks post-op 12-12-20 .  Vasopneumatic with pillow between thorax and right elbow.    Consulted and Agree with Plan of Care Patient             Patient  will benefit from skilled therapeutic intervention in order to improve the following deficits and impairments:  Decreased activity tolerance, Decreased range of motion, Pain, Increased edema  Visit Diagnosis: Chronic right shoulder pain  Stiffness of right shoulder, not elsewhere classified     Problem List Patient Active Problem List   Diagnosis Date Noted   Hypomagnesemia    Status post coronary artery stent placement    Chest pain 02/21/2020   Hypokalemia    Hyperlipidemia with target LDL less than 70    Angina pectoris ([place]) 15/99/6895   Chronic systolic heart failure ([place]) 02/12/2020   Coronary artery disease with exertional angina ([place])    Cerebritis 12/06/2019   Cutaneous abscess of left foot    Diabetic foot ([place])    Acute systolic heart failure ([place])    Embolic infarction ([place])    Hyperglycemia    Hypoxia    Lower respiratory tract infection due to COVID-19 virus 10/10/2019   Benign hypertension 07/23/2015   Gastro-esophageal reflux disease with esophagitis 07/23/2015   Gout 07/23/2015   Obstructive sleep apnea 07/23/2015   Snoring 07/23/2015   Chronic idiopathic gout of multiple sites 06/02/2015   Class 2 obesity in adult 06/02/2015   Fibromyalgia 06/02/2015   Irritable bowel syndrome with diarrhea 06/02/2015   Mild persistent asthma 06/02/2015   Reaction, adjustment, with depressed mood, prolonged 06/02/2015   Type 2 diabetes mellitus without complication, without long-term current use of insulin ([place]) 06/02/2015    Grayling Schranz,CHRIS, PTA 12/17/2020, 4:55 PM  [name] [place], [place], [address] Phone: [phone]   Fax:  [phone]  Name: Micheal Bell MRN: [id number] Date of Birth: April 06, 1970

## 2020-12-18 DIAGNOSIS — G4733 Obstructive sleep apnea (adult) (pediatric): Secondary | ICD-10-CM | POA: Diagnosis not present

## 2020-12-19 ENCOUNTER — Ambulatory Visit: Payer: Medicaid Other | Admitting: Physical Therapy

## 2020-12-19 ENCOUNTER — Other Ambulatory Visit: Payer: Self-pay

## 2020-12-19 ENCOUNTER — Encounter: Payer: Self-pay | Admitting: Physical Therapy

## 2020-12-19 DIAGNOSIS — M25611 Stiffness of right shoulder, not elsewhere classified: Secondary | ICD-10-CM | POA: Diagnosis not present

## 2020-12-19 DIAGNOSIS — G8929 Other chronic pain: Secondary | ICD-10-CM

## 2020-12-19 DIAGNOSIS — M25511 Pain in right shoulder: Secondary | ICD-10-CM | POA: Diagnosis not present

## 2020-12-19 NOTE — Therapy (Signed)
[name] [place], [place], [address] Phone: [phone]   Fax:  [phone]  Physical Therapy Treatment  Patient Details  Name: Micheal Bell MRN: [id number] Date of Birth: 1970/10/23 Referring Provider (PT): Amber Portfield PA-C   Encounter Date: 12/19/2020   PT End of Session - 12/19/20 0854     Visit Number 12    Number of Visits 12    Date for PT Re-Evaluation 12/26/20    PT Start Time 0818    PT Stop Time 0858    PT Time Calculation (min) 40 min    Activity Tolerance Patient tolerated treatment well    Behavior During Therapy [facility] for tasks assessed/performed             Past Medical History:  Diagnosis Date   Anxiety    Arthritis    Asthma    inhaler daily   CHF (congestive heart failure) ([place])    Coronary artery disease    Depression    Diabetes mellitus without complication ([place])    DVT (deep venous thrombosis) ([place])    Dyspnea    Fibromyalgia    Gout    History of kidney stones    Hypertension    IBS (irritable bowel syndrome)    Myocardial infarction ([place]) 09/2019   Pneumonia 09/2019   Stroke ([place]) 09/2019   Vein disorder     Past Surgical History:  Procedure Laterality Date   CORONARY ATHERECTOMY N/A 02/20/2020   Procedure: CORONARY ATHERECTOMY;  Surgeon: Martinique, [name], MD;  Location: [place] CV LAB;  Service: Cardiovascular;  Laterality: N/A;  CSI LAD   CORONARY STENT INTERVENTION N/A 02/20/2020   Procedure: CORONARY STENT INTERVENTION;  Surgeon: Martinique, [name], MD;  Location: [place] CV LAB;  Service: Cardiovascular;  Laterality: N/A;  LAD CFX   FOOT SURGERY     INTRAVASCULAR ULTRASOUND/IVUS N/A 02/20/2020   Procedure: Intravascular Ultrasound/IVUS;  Surgeon: Martinique, [name], MD;  Location: [place] CV LAB;  Service: Cardiovascular;  Laterality: N/A;   LEFT HEART CATH AND CORONARY ANGIOGRAPHY N/A 02/12/2020   Procedure: LEFT HEART CATH AND CORONARY ANGIOGRAPHY;   Surgeon: Sherren Mocha, MD;  Location: [place] CV LAB;  Service: Cardiovascular;  Laterality: N/A;   REVERSE SHOULDER ARTHROPLASTY Right 10/17/2020   Procedure: REVERSE SHOULDER ARTHROPLASTY;  Surgeon: Tania Ade, MD;  Location: [facility];  Service: Orthopedics;  Laterality: Right;   TEE WITHOUT CARDIOVERSION N/A 10/24/2019   Procedure: TRANSESOPHAGEAL ECHOCARDIOGRAM (TEE);  Surgeon: Josue Hector, MD;  Location: [facility] ENDOSCOPY;  Service: Cardiovascular;  Laterality: N/A;    There were no vitals filed for this visit.   Subjective Assessment - 12/19/20 0814     Subjective COVID-19 screen performed prior to patient entering clinic. Patient reported some discomfort with movement and none at rest 4/10 today.  11-14 22 to MD    Pertinent History CHF, CAD, DM, Fibromyalgia, MI, stroke, HTN, foot surgery, coronary stent.    Patient Stated Goals Would like to use right UE functionally with less pain.    Currently in Pain? Yes    Pain Location Elbow    Pain Orientation Right    Pain Descriptors / Indicators Sore    Pain Type Acute pain    Pain Onset In the past 7 days    Pain Frequency Intermittent                [facility] PT Assessment - 12/19/20 0001  Assessment   Medical Diagnosis Right total shoulder replacement.    Referring Provider (PT) Amber Portfield PA-C    Onset Date/Surgical Date 10/17/20    Hand Dominance Right      Precautions   Precaution Comments No ultrasound.  Progress per Dr. Bettina Gavia TSA protocol.                           [facility] Adult PT Treatment/Exercise - 12/19/20 0001       Shoulder Exercises: Seated   Extension Strengthening;Right;20 reps;Theraband    Theraband Level (Shoulder Extension) Level 1 (Yellow)    Retraction Strengthening;Right;20 reps;Theraband    Theraband Level (Shoulder Retraction) Level 1 (Yellow)    Other Seated Exercises R shoulder AROM bicep curls, attempted short lever flexion x20 reps      Shoulder  Exercises: Standing   Other Standing Exercises Blue ball on raised mat table RUE elevation pushing ball up on wedge 5x10      Shoulder Exercises: Pulleys   Flexion 5 minutes      Shoulder Exercises: ROM/Strengthening   Ranger sitting; flex/ext, CW and CCW circles x2 min each      Modalities   Modalities Vasopneumatic      Vasopneumatic   Number Minutes Vasopneumatic  15 minutes    Vasopnuematic Location  Shoulder    Vasopneumatic Pressure Low    Vasopneumatic Temperature  34/pain                          PT Long Term Goals - 12/17/20 0839       PT LONG TERM GOAL #1   Title Independent with a HEP.    Time 6    Period Weeks    Status Partially Met      PT LONG TERM GOAL #2   Title Active right shoulder flexion to 135 degrees so the patient can easily reach overhead.    Baseline P/Arom to 90 degrees... MD stated to Pt that he might not be ale to raise RT arm over shldr level    Time 6    Period Weeks    Status On-going      PT LONG TERM GOAL #3   Title Active ER to 70 degrees+ to allow for easily donning/doffing of apparel.    Baseline 10 degrees.    Period Weeks    Status On-going      PT LONG TERM GOAL #4   Title Increase right shoulder strength to a solid 4 to 4+/5 to increase stability for performance of functional activities.    Status Partially Met      PT LONG TERM GOAL #5   Title Perform ADL's with pain not > 3/10.    Period Weeks    Status On-going                   Plan - 12/19/20 0905     Clinical Impression Statement Patient presented with more R elbow pain recently. Patient still has active limitations with shoulder flexion. Able to tolerate all posterior strengthening without complaint. Some shoulder elevation and protraction noted with attempts at bicep curls or short lever flexion. Normal vasopneumatic response noted following removal of the modality.    Personal Factors and Comorbidities Comorbidity 1;Comorbidity 2     Comorbidities CHF, CAD, DM, Fibromyalgia, MI, stroke, HTN, foot surgery, coronary stent.    Examination-Activity Limitations Other;Reach Overhead    Examination-Participation  Restrictions Other    Stability/Clinical Decision Making Stable/Uncomplicated    Rehab Potential Good    PT Frequency 2x / week    PT Duration 6 weeks    PT Treatment/Interventions ADLs/Self Care Home Management;Cryotherapy;Electrical Stimulation;Moist Heat;Functional mobility training;Therapeutic activities;Therapeutic exercise;Neuromuscular re-education;Manual techniques;Patient/family education;Passive range of motion;Vasopneumatic Device    PT Next Visit Plan See protocol.    8 weeks post-op 12-12-20 .  Vasopneumatic with pillow between thorax and right elbow.    Consulted and Agree with Plan of Care Patient             Patient will benefit from skilled therapeutic intervention in order to improve the following deficits and impairments:  Decreased activity tolerance, Decreased range of motion, Pain, Increased edema  Visit Diagnosis: Chronic right shoulder pain  Stiffness of right shoulder, not elsewhere classified     Problem List Patient Active Problem List   Diagnosis Date Noted   Hypomagnesemia    Status post coronary artery stent placement    Chest pain 02/21/2020   Hypokalemia    Hyperlipidemia with target LDL less than 70    Angina pectoris ([place]) 25/85/2778   Chronic systolic heart failure ([place]) 02/12/2020   Coronary artery disease with exertional angina ([place])    Cerebritis 12/06/2019   Cutaneous abscess of left foot    Diabetic foot ([place])    Acute systolic heart failure ([place])    Embolic infarction ([place])    Hyperglycemia    Hypoxia    Lower respiratory tract infection due to COVID-19 virus 10/10/2019   Benign hypertension 07/23/2015   Gastro-esophageal reflux disease with esophagitis 07/23/2015   Gout 07/23/2015   Obstructive sleep apnea 07/23/2015   Snoring 07/23/2015   Chronic  idiopathic gout of multiple sites 06/02/2015   Class 2 obesity in adult 06/02/2015   Fibromyalgia 06/02/2015   Irritable bowel syndrome with diarrhea 06/02/2015   Mild persistent asthma 06/02/2015   Reaction, adjustment, with depressed mood, prolonged 06/02/2015   Type 2 diabetes mellitus without complication, without long-term current use of insulin ([place]) 06/02/2015    Standley Brooking, PTA 12/19/2020, 9:22 AM  [name] [place], [place], [address] Phone: [phone]   Fax:  [phone]  Name: EZEQUIAS LARD MRN: [id number] Date of Birth: 04-Sep-1970

## 2020-12-23 ENCOUNTER — Other Ambulatory Visit: Payer: Self-pay | Admitting: Cardiovascular Disease

## 2020-12-24 DIAGNOSIS — G4733 Obstructive sleep apnea (adult) (pediatric): Secondary | ICD-10-CM | POA: Diagnosis not present

## 2021-01-02 ENCOUNTER — Telehealth: Payer: Self-pay | Admitting: *Deleted

## 2021-01-02 NOTE — Telephone Encounter (Deleted)
Please advise 

## 2021-01-02 NOTE — Telephone Encounter (Signed)
Patient is calling for a medication refill of Lidocaine 5% ointment sent to pharmacy on file. Please advise.

## 2021-01-06 MED ORDER — LIDOCAINE 5 % EX OINT: TOPICAL_OINTMENT | CUTANEOUS | 3 refills | Status: DC

## 2021-01-23 DIAGNOSIS — G4733 Obstructive sleep apnea (adult) (pediatric): Secondary | ICD-10-CM | POA: Diagnosis not present

## 2021-01-29 DIAGNOSIS — G4733 Obstructive sleep apnea (adult) (pediatric): Secondary | ICD-10-CM | POA: Diagnosis not present

## 2021-01-29 DIAGNOSIS — E669 Obesity, unspecified: Secondary | ICD-10-CM | POA: Diagnosis not present

## 2021-02-03 NOTE — Progress Notes (Signed)
[facility] [address] [place]. [place] [address] [phone]       OFFICE FOLLOW UP VISITT NOTE  [name] Date of Birth:  02/10/1971 Medical Record Number:  [id number]   Referring MD:  Benedetto Goad  Reason for Referral: Stroke  HPI: Initial visit 05/01/2020 [name] :[name] is a 50 year old Caucasian male with history of diabetes, hypertension, gout, irritable bowel syndrome, fibromyalgia, obesity who is seen today for office consultation visit for his stroke and hand weakness.  History is obtained from the patient and his uncle who is accompanying him as well as review of electronic medical records and I have personally reviewed pertinent imaging films in PACS. Patient initially was admitted on 10/08/2020 for Covid pneumonia to the floor and decompensated on 10/11/2020 requiring intubation.  He self extubated himself on 10/15/2020 but required reintubation.  He subsequently was extubated on 10/20/2020 and a typical therapy for Covid including steroids, twice daily CT neb and remdesivir.  Hospitalization was complicated by MSSA pneumonia and group B Streptococcus bacteremia due to left foot abscess.  MRI of the foot showed left foot abscess.  And systolic heart failure.  TEE was negative for vegetations.  He developed some right-sided weakness and confusion and MRI scan of the brain was obtained on 10/25/2019 which showed a 17 mm focus of restricted diffusion in the left frontoparietal subcortical region which showed ring like enhancement on postcontrast raising suspicion for subacute infarct versus cerebritis.  MR angiogram of the brain on 10/29/2019 showed no large vessel stenosis or occlusion.  EEG showed mild diffuse slowing without definite epileptiform activity.  He was started on Keppra for seizure prophylaxis which she seems to be tolerating well without side effects.  Patient was not on aspirin at the time.  Patient states he initially did well and recovered his  right upper extremity strength after discharge.  However he was admitted with chest pain on 02/20/2020 and was found to have multivessel coronary artery disease and underwent emergent 3 coronary stents.  Following which she had severe pain in the right elbow with swelling and subsequently right hand weakness.  He is now developed clawing of the right ulnar fingers with inability to extend them with weakness and numbness that.  He did finish home physical and occupational therapy.  Is able to ambulate independently but still has persistent right hand weakness and slight dragging of the right leg.  He has not undergone any outpatient therapies.  He states he has no further seizures. Update 08/05/2020 [name] : He returns for follow-up after last visit 3 months ago.  He continues to have significant weakness and wasting of his right hand as well as gait and balance difficulties.  He had EMG nerve conduction study done on 04/24/2020 by [name] confirmed underlying diabetic neuropathy as well as mononeuropathy of the right ulnar nerve which was severe in the right upper extremity but difficult to localize exactly.  There was evidence of mild median neuropathy bilaterally as well.  MRI scan of the brain done at [facility] on 05/15/2020 showed a small chronic left centrum semiovale infarct with small focus of microhemorrhage and mild changes of small vessel disease.  Patient is tolerating aspirin Plavix well without bruising or bleeding.  He states blood pressure is under good control today it is 110/75.  His blood sugars are also under good control.  He still has paresthesias in his feet particularly at night despite taking gabapentin 300 mg 3 times  daily.  He has no new complaints.  Still on Keppra which was started for seizure prophylaxis in the hospital but has not had any seizures.   Update 02/03/2021 JM: Patient returns for 70-month follow-up visit accompanied by his uncle.  Stable from stroke standpoint  without new stroke/TIA symptoms.  Continued gait difficulties with balance impairment stable without worsening.  Continued use of cane without any recent falls. Repeat EEG negative and since discontinued Keppra. Uncle does mention last week patient seemed to be off during the day with increased forgetfulness and gait unsteadiness. Patient does add in saying he remembers not feeling well that day. Reports being seen by pulmonologist that same day but no mention of these concerns during that visit. Denies any speech difficulties, weakness, numbness/tingling, visual changes or headache. Felt back to his normal self the following day.  S/p right shoulder arthroplasty 8/25 by Dr. Ave Filter - completed therapies. Some elbow pain but this improved after restarting his prior arthritic medication meloxicam. His right hand is still contracted. C/o BLE neuropathy which he reports is being managed by podiatry.  He is currently on multiple medications for pain/neuropathy including gabapentin 1200 mg BID, hydromorphone 4 mg 3 times daily, nortriptyline 25 mg nightly, lidocaine ointment, and meloxicam.   No further concerns at this time     ROS:   14 system review of systems is positive for those listed in HPI and all other systems negative    PMH:  Past Medical History:  Diagnosis Date   Anxiety    Arthritis    Asthma    inhaler daily   CHF (congestive heart failure) (HCC)    Coronary artery disease    Depression    Diabetes mellitus without complication (HCC)    DVT (deep venous thrombosis) (HCC)    Dyspnea    Fibromyalgia    Gout    History of kidney stones    Hypertension    IBS (irritable bowel syndrome)    Myocardial infarction (HCC) 09/2019   Pneumonia 09/2019   Stroke (HCC) 09/2019   Vein disorder     Social History:  Social History   Socioeconomic History   Marital status: Single    Spouse name: Not on file   Number of children: Not on file   Years of education: Not on file    Highest education level: Not on file  Occupational History   Occupation: disabled  Tobacco Use   Smoking status: Never   Smokeless tobacco: Current    Types: Snuff  Vaping Use   Vaping Use: Never used  Substance and Sexual Activity   Alcohol use: Never   Drug use: Never   Sexual activity: Not on file  Other Topics Concern   Not on file  Social History Narrative   Lives alone, mom is next door   Right handed   Drinks no caffeine   Social Determinants of Health   Financial Resource Strain: Not on file  Food Insecurity: Not on file  Transportation Needs: Not on file  Physical Activity: Not on file  Stress: Not on file  Social Connections: Not on file  Intimate Partner Violence: Not on file    Medications:   Current Outpatient Medications on File Prior to Visit  Medication Sig Dispense Refill   ACCU-CHEK GUIDE test strip Use to check blood glucose twice daily     Accu-Chek Softclix Lancets lancets 2 (two) times daily.     albuterol (PROVENTIL HFA;VENTOLIN HFA) 108 (90 Base) MCG/ACT inhaler  Inhale 1-2 puffs into the lungs every 4 (four) hours as needed for wheezing or shortness of breath.     albuterol (PROVENTIL) (2.5 MG/3ML) 0.083% nebulizer solution Take 2.5 mg by nebulization 3 (three) times daily as needed for wheezing or shortness of breath.     allopurinol (ZYLOPRIM) 300 MG tablet Take 300 mg by mouth daily.     apixaban (ELIQUIS) 5 MG TABS tablet Take 5 mg by mouth 2 (two) times daily.     aspirin EC 81 MG tablet Take 1 tablet (81 mg total) by mouth daily. Swallow whole. 30 tablet 11   buPROPion (WELLBUTRIN XL) 150 MG 24 hr tablet Take 150 mg by mouth every morning.     carvedilol (COREG) 12.5 MG tablet Take 12.5 mg by mouth 2 (two) times daily with a meal.     clopidogrel (PLAVIX) 75 MG tablet Take 1 tablet (75 mg total) by mouth daily. 90 tablet 3   cyclobenzaprine (FLEXERIL) 10 MG tablet Take 0.5 tablets (5 mg total) by mouth 2 (two) times daily as needed for muscle  spasms. (Patient taking differently: Take 10 mg by mouth 3 (three) times daily.) 15 tablet 0   empagliflozin (JARDIANCE) 10 MG TABS tablet Take 1 tablet (10 mg total) by mouth daily before breakfast. 90 tablet 3   fluticasone (FLONASE) 50 MCG/ACT nasal spray Place 1 spray into both nostrils daily as needed for allergies.     furosemide (LASIX) 20 MG tablet Take 1 tablet (20 mg total) by mouth daily as needed for fluid or edema (weight gain of 3 lbs or more in a day or 5 lbs in a week). 30 tablet 3   gabapentin (NEURONTIN) 300 MG capsule Take 1,200 mg by mouth 2 (two) times daily.     HYDROmorphone (DILAUDID) 4 MG tablet Take 1 tablet (4 mg total) by mouth in the morning, at noon, and at bedtime. 6 tablet 0   insulin glargine (LANTUS) 100 UNIT/ML Solostar Pen Inject 20 Units into the skin daily. (Patient taking differently: Inject 30 Units into the skin every evening.) 15 mL 0   isosorbide mononitrate (IMDUR) 30 MG 24 hr tablet Take 30 mg by mouth daily.     lidocaine (XYLOCAINE) 5 % ointment APPLY TO AFFECTED AREA TOPICALLY AS NEEDED 35.44 g 3   losartan (COZAAR) 25 MG tablet Take 25 mg by mouth daily.     metoprolol tartrate (LOPRESSOR) 50 MG tablet Take 75 mg by mouth 2 (two) times daily.     miconazole (MICATIN) 2 % cream Apply under the right arm pit 2 times daily 28 g 1   Misc. Devices (RECONSTITUBE) MISC Nebulizer, tubing  J45.30  Fax to [employer]     mometasone-formoterol (DULERA) 200-5 MCG/ACT AERO Inhale 1 puff into the lungs 2 (two) times daily.     montelukast (SINGULAIR) 10 MG tablet Take 10 mg by mouth at bedtime.     Multiple Vitamins-Minerals (MULTIVITAMIN WITH MINERALS) tablet Take 1 tablet by mouth daily.     Nebulizers (COMP AIR COMPRESSOR NEBULIZER) MISC USE AS DIRECTED INCLUDE TUBING J45.30     nitroGLYCERIN (NITROSTAT) 0.4 MG SL tablet Place 0.4 mg under the tongue every 5 (five) minutes as needed for chest pain.     nortriptyline (PAMELOR) 25 MG capsule Take 25 mg by mouth  at bedtime.     pantoprazole (PROTONIX) 40 MG tablet Take 40 mg by mouth daily.     PENTIPS 32G X 4 MM MISC See admin instructions.  with insulin     potassium chloride SA (KLOR-CON) 20 MEQ tablet Take 20 mEq by mouth daily.     RESTASIS 0.05 % ophthalmic emulsion Place 1 drop into both eyes 2 (two) times daily.     rosuvastatin (CRESTOR) 20 MG tablet TAKE ONE TABLET BY MOUTH EVERY DAY (Patient taking differently: Take 20 mg by mouth daily.) 90 tablet 3   sacubitril-valsartan (ENTRESTO) 24-26 MG TAKE ONE TABLET BY MOUTH TWICE DAILY 180 tablet 3   spironolactone (ALDACTONE) 25 MG tablet Take 0.5 tablets (12.5 mg total) by mouth every other day. (Patient taking differently: Take 12.5 mg by mouth daily.) 60 tablet 1   tobramycin-dexamethasone (TOBRADEX) ophthalmic ointment Place 1 application into both eyes daily as needed (eye infection).     triamcinolone (KENALOG) 0.1 % Apply 1 application topically 2 (two) times daily as needed (irritation).     zolpidem (AMBIEN) 10 MG tablet Take 10 mg by mouth at bedtime.     Current Facility-Administered Medications on File Prior to Visit  Medication Dose Route Frequency Provider Last Rate Last Admin   regadenoson (LEXISCAN) injection SOLN 0.4 mg  0.4 mg Intravenous Once O'Neal, Ronnald Ramp, MD        Allergies:   Allergies  Allergen Reactions   Azithromycin     Blisters in mouth    Lodine [Etodolac] Anaphylaxis    Physical Exam Today's Vitals   02/04/21 1042  BP: 128/82  Pulse: 77  Weight: 275 lb (124.7 kg)  Height:  (1.88 m)   Body mass index is 35.31 kg/m.   General: well developed, well nourished pleasant middle-aged mildly obese Caucasian male, seated, in no evident distress Head: head normocephalic and atraumatic.   Neck: supple with no carotid or supraclavicular bruits Cardiovascular: regular rate and rhythm, no murmurs Musculoskeletal: no deformity Skin:  no rash/petichiae Vascular:  Normal pulses all  extremities  Neurologic Exam Mental Status: Awake and fully alert.  Fluent speech and language.  Oriented to place and time. Recent and remote memory intact. Attention span, concentration and fund of knowledge appropriate. Mood and affect appropriate.  Cranial Nerves: Pupils equal, briskly reactive to light. Extraocular movements full without nystagmus. Visual fields full to confrontation. Hearing intact. Facial sensation intact. Face, tongue, palate moves normally and symmetrically.  Motor: Diminished bulk in the right hand intrinsic muscles lumbricals and interossei with wasting.  Clawing of the ulnar 3 digits with nonfixed contractures.  Weakness of right grip and intrinsic hand muscles.  Normal strength in other extremities Sensory.:  Diminished touch pinprick sensation in the right hand in the ulnar nerve mediated dermatome.  Positive Tinel sign over the right ulnar groove.  Decreased sensation BLE Coordination: Rapid alternating movements normal in all extremities. Finger-to-nose and heel-to-shin performed accurately bilaterally. Gait and Station: Arises from chair without difficulty. Stance is normal. Gait demonstrates slight dragging of the right leg and stiffness with use of cane.  Not able to heel, toe and tandem walk without difficulty.  Reflexes: 1+ and symmetric. Toes downgoing.       ASSESSMENT: 50 year old Caucasian male with Covid infection in 09/2020 complicated by Covid pneumonia, sepsis and left frontal small subcortical infarct etiology septic emboli versus Covid related hypercoagulability.  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 and he also has underlying diabetic chronic polyneuropathy.     PLAN:  Left frontal stroke -Continue on aspirin and Plavix and Crestor for stroke prevention given history of multiple cardiac stents. He also  reports being on Eliquis 5 mg twice daily as advised by his cardiologist but unable to verify  this in recent note - he was advised to further discuss this at follow up visit -Continue close PCP follow-up for aggressive stroke risk factor management to maintain aggressive risk factor modification strict control of hypertension with blood pressure goal below 130/90, lipids with LDL cholesterol goal below 70 mg percent and diabetes with hemoglobin A1c goal below 7.0 %.   -unsure cause of 1 day last week of increased forgetfulness and gait unsteadiness and generally not feeling well -does not clearly fit picture for seizure as symptoms persisted throughout the day. No focal new deficits on exam today and denied any other associated symptoms that day.  Possibly in setting of polypharmacy but advised if this should reoccur, he should call 911 immediately for further evaluation. Will hold off on further evaluation at this time as he is currently max medical treatment for stroke prevention and current treatment plan would not change   Chronic polyneuropathy/pain Ulnar neuropathy of elbow -elbow neuropathy stable -Currently being managed by PCP/podiatry.  Current polypharmacy and would be hesitant to make any further adjustments - defer to PCP - may benefit from pain management but again will defer to PCP -use of meloxicam with both aspirin and Plavix (and ?eliquis) greatly increases risk of bleeds - advised to further discuss with PCP/ortho (unsure exactly who restarted) for other treatment options   Follow-up in 6 months or call earlier if needed    CC:  [name], MD  [name]  I spent 38 minutes of face-to-face and non-face-to-face time with patient and uncle.  This included previsit chart review, lab review, study review, order entry, electronic health record documentation, patient and uncle education and discussion re: hx of prior stroke, secondary stroke prevention measures and aggressive stroke risk factor management, chronic polyneuropathy and pain, recent episode as above and  possible causes and answered all other questions to patients and uncles satisfaction   Ihor Austin, AGNP-BC  [facility] [address] [place], [place] [address]  Phone [phone] Fax [phone] Note: This document was prepared with digital dictation and possible smart phrase technology. Any transcriptional errors that result from this process are unintentional.

## 2021-02-04 ENCOUNTER — Encounter: Payer: Self-pay | Admitting: Adult Health

## 2021-02-04 ENCOUNTER — Ambulatory Visit: Payer: Medicaid Other | Admitting: Adult Health

## 2021-02-04 VITALS — BP 128/82 | HR 77 | Ht 74.0 in | Wt 275.0 lb

## 2021-02-04 DIAGNOSIS — Z8673 Personal history of transient ischemic attack (TIA), and cerebral infarction without residual deficits: Secondary | ICD-10-CM

## 2021-02-04 DIAGNOSIS — G5621 Lesion of ulnar nerve, right upper limb: Secondary | ICD-10-CM | POA: Diagnosis not present

## 2021-02-04 DIAGNOSIS — G629 Polyneuropathy, unspecified: Secondary | ICD-10-CM | POA: Diagnosis not present

## 2021-02-04 NOTE — Patient Instructions (Addendum)
Continue to monitor for any type of seizure activity or new stroke symptoms - if present, call 911 immediately for further evaluation   Continue aspirin 81 mg daily, clopidogrel 75 mg daily, and Eliquis (apixaban) daily  and Crestor  for secondary stroke prevention and extensive cardiac history   Continue to follow up with PCP regarding cholesterol, blood pressure and diabetes management  Maintain strict control of hypertension with blood pressure goal below 130/90, diabetes with hemoglobin A1c goal below 7.0 % and cholesterol with LDL cholesterol (bad cholesterol) goal below 70 mg/dL.   Signs of a Stroke? Follow the BEFAST method:  Balance Watch for a sudden loss of balance, trouble with coordination or vertigo Eyes Is there a sudden loss of vision in one or both eyes? Or double vision?  Face: Ask the person to smile. Does one side of the face droop or is it numb?  Arms: Ask the person to raise both arms. Does one arm drift downward? Is there weakness or numbness of a leg? Speech: Ask the person to repeat a simple phrase. Does the speech sound slurred/strange? Is the person confused ? Time: If you observe any of these signs, call 911.     Followup in the future with me in 6 months or call earlier if needed       Thank you for coming to see [place] at [facility]. I hope we have been able to provide you high quality care today.  You may receive a patient satisfaction survey over the next few weeks. We would appreciate your feedback and comments so that we may continue to improve ourselves and the health of our patients. Stroke prevention

## 2021-02-05 NOTE — Progress Notes (Signed)
I agree with the above plan 

## 2021-02-18 ENCOUNTER — Other Ambulatory Visit: Payer: Self-pay

## 2021-02-18 ENCOUNTER — Ambulatory Visit (INDEPENDENT_AMBULATORY_CARE_PROVIDER_SITE_OTHER): Payer: Medicaid Other | Admitting: Podiatry

## 2021-02-18 DIAGNOSIS — E1142 Type 2 diabetes mellitus with diabetic polyneuropathy: Secondary | ICD-10-CM

## 2021-02-18 DIAGNOSIS — M79675 Pain in left toe(s): Secondary | ICD-10-CM | POA: Diagnosis not present

## 2021-02-18 DIAGNOSIS — B351 Tinea unguium: Secondary | ICD-10-CM | POA: Diagnosis not present

## 2021-02-18 DIAGNOSIS — M79674 Pain in right toe(s): Secondary | ICD-10-CM

## 2021-02-18 MED ORDER — LIDOCAINE 5 % EX OINT: TOPICAL_OINTMENT | CUTANEOUS | 3 refills | Status: DC

## 2021-02-18 MED ORDER — CICLOPIROX 8 % EX SOLN: Freq: Every day | CUTANEOUS | 2 refills | Status: AC

## 2021-02-18 NOTE — Progress Notes (Signed)
°  Subjective:  Patient ID: Micheal Bell, male    DOB: Dec 11, 1970,  MRN: [id number]  Chief Complaint  Patient presents with   Nail Problem    Thick painful toenails, 3 month follow up    50 y.o. male returns with the above complaint. History confirmed with patient.  He is doing well feels like his strength is getting better the nails are thickened and elongated again the lidocaine cream is been helpful for neuropathy Objective:  Physical Exam: warm, good capillary refill, normal DP and PT pulses, reduced sensation grossly throughout the left lower extremity, onychomycosis x10 with yellow and elongated thickened nail plates, worst of which is the right hallux Left Foot: C-shaped foot type with prominent fifth metatarsal base, ulceration is healed, minimal edema and tenderness over the lateral column    Assessment:   1. Pain due to onychomycosis of toenails of both feet   2. Type 2 diabetes mellitus with diabetic polyneuropathy, without long-term current use of insulin (HCC)       Plan:  Patient was evaluated and treated and all questions answered.  Patient educated on diabetes. Discussed proper diabetic foot care and discussed risks and complications of disease. Educated patient in depth on reasons to return to the office immediately should he/she discover anything concerning or new on the feet. All questions answered. Discussed proper shoes as well.   Continue wearing diabetic shoes  Refill of lidocaine ointment Rx for neuropathy sent to pharmacy  Discussed the etiology and treatment options for the condition in detail with the patient. Educated patient on the topical and oral treatment options for mycotic nails. Recommended debridement of the nails today. Sharp and mechanical debridement performed of all painful and mycotic nails today. Nails debrided in length and thickness using a nail nipper to level of comfort. Discussed treatment options including appropriate shoe gear. Follow  up as needed for painful nails.  Rx for Penlac sent to pharmacy discussed use instructions   Return in about 3 months (around 05/19/2021) for at risk diabetic foot care.    Sharl Ma, DPM 02/18/2021

## 2021-03-03 ENCOUNTER — Other Ambulatory Visit: Payer: Self-pay | Admitting: Cardiovascular Disease

## 2021-04-26 ENCOUNTER — Other Ambulatory Visit: Payer: Self-pay | Admitting: Cardiovascular Disease

## 2021-05-20 ENCOUNTER — Ambulatory Visit (INDEPENDENT_AMBULATORY_CARE_PROVIDER_SITE_OTHER): Payer: Medicaid Other | Admitting: Podiatry

## 2021-05-20 ENCOUNTER — Other Ambulatory Visit: Payer: Self-pay

## 2021-05-20 DIAGNOSIS — B351 Tinea unguium: Secondary | ICD-10-CM | POA: Diagnosis not present

## 2021-05-20 DIAGNOSIS — M216X1 Other acquired deformities of right foot: Secondary | ICD-10-CM | POA: Diagnosis not present

## 2021-05-20 DIAGNOSIS — Q66229 Congenital metatarsus adductus, unspecified foot: Secondary | ICD-10-CM | POA: Diagnosis not present

## 2021-05-20 DIAGNOSIS — M79674 Pain in right toe(s): Secondary | ICD-10-CM | POA: Diagnosis not present

## 2021-05-20 DIAGNOSIS — M216X2 Other acquired deformities of left foot: Secondary | ICD-10-CM | POA: Diagnosis not present

## 2021-05-20 DIAGNOSIS — E1142 Type 2 diabetes mellitus with diabetic polyneuropathy: Secondary | ICD-10-CM | POA: Diagnosis not present

## 2021-05-20 DIAGNOSIS — M79675 Pain in left toe(s): Secondary | ICD-10-CM

## 2021-05-20 MED ORDER — LIDOCAINE 5 % EX OINT: TOPICAL_OINTMENT | CUTANEOUS | 3 refills | Status: DC

## 2021-05-20 NOTE — Progress Notes (Signed)
?  Subjective:  ?Patient ID: Micheal Bell, male    DOB: May 23, 1970,  MRN: [id number] ? ?Chief Complaint  ?Patient presents with  ? Nail Problem  ?  Thick painful toenails, 3 month follow up   ? Diabetes  ?  A1C  11.3  ? ? ?51 y.o. male returns with the above complaint. History confirmed with patient.  He is doing well feels like his strength is getting better the nails are thickened and elongated again the lidocaine cream has been helpful for neuropathy, he would like a refill of it.  He continues to have burning tingling pain in his feet constantly.  He is near the max dose of gabapentin and has not really helped ? ? ?Objective:  ?Physical Exam: ?warm, good capillary refill, normal DP and PT pulses, reduced sensation grossly throughout the left lower extremity, onychomycosis x10 with yellow and elongated thickened nail plates, worst of which is the right hallux ?Left Foot: C-shaped foot type with prominent fifth metatarsal base, ulceration is healed, minimal edema and tenderness over the lateral column ? ? ? ?Assessment:  ? ?1. Pain due to onychomycosis of toenails of both feet   ?2. Type 2 diabetes mellitus with diabetic polyneuropathy, without long-term current use of insulin ([place])   ?3. Diabetic peripheral neuropathy ([place])   ?4. Metatarsus adductus   ?5. Acquired bilateral pes cavus   ? ? ? ? ?Plan:  ?Patient was evaluated and treated and all questions answered. ? ?Patient educated on diabetes. Discussed proper diabetic foot care and discussed risks and complications of disease. Educated patient in depth on reasons to return to the office immediately should he/she discover anything concerning or new on the feet. All questions answered. Discussed proper shoes as well.  ? ?New prescription for diabetic shoes was faxed for him to the orthotist to [phone] ? ?We discussed further treatment for his diabetic peripheral neuropathy.  I refilled his lidocaine which has been somewhat helpful but is difficult because  he have to reapply it constantly.  I discussed with him other treatment options including a spinal cord stimulator consultation with Dr. Davy Pique at [place] [facility] and this was referred for him.  We also discussed referral to pain management.  He will meet with Dr. Davy Pique and I will see him back as needed for his peripheral neuropathy. ? ?Discussed the etiology and treatment options for the condition in detail with the patient. Educated patient on the topical and oral treatment options for mycotic nails. Recommended debridement of the nails today. Sharp and mechanical debridement performed of all painful and mycotic nails today. Nails debrided in length and thickness using a nail nipper to level of comfort. Discussed treatment options including appropriate shoe gear. Follow up as needed for painful nails.  Continue Penlac ? ?No follow-ups on file.  ? ? ?Lanae Crumbly, DPM ?05/20/2021 ? ? ? ?

## 2021-06-04 ENCOUNTER — Telehealth: Payer: Self-pay | Admitting: Podiatry

## 2021-06-04 NOTE — Telephone Encounter (Signed)
Pt called stating he has yet to hear back from someone regarding his nerve block. He is requesting an update.  ? ?Please advise ?

## 2021-06-06 ENCOUNTER — Other Ambulatory Visit: Payer: Self-pay | Admitting: Cardiovascular Disease

## 2021-06-12 ENCOUNTER — Ambulatory Visit (INDEPENDENT_AMBULATORY_CARE_PROVIDER_SITE_OTHER): Payer: Medicaid Other | Admitting: Cardiovascular Disease

## 2021-06-12 ENCOUNTER — Encounter: Payer: Self-pay | Admitting: Cardiovascular Disease

## 2021-06-12 VITALS — BP 100/68 | HR 76 | Ht 72.0 in | Wt 279.6 lb

## 2021-06-12 DIAGNOSIS — I10 Essential (primary) hypertension: Secondary | ICD-10-CM

## 2021-06-12 DIAGNOSIS — I5022 Chronic systolic (congestive) heart failure: Secondary | ICD-10-CM | POA: Diagnosis not present

## 2021-06-12 DIAGNOSIS — E785 Hyperlipidemia, unspecified: Secondary | ICD-10-CM | POA: Diagnosis not present

## 2021-06-12 DIAGNOSIS — I2510 Atherosclerotic heart disease of native coronary artery without angina pectoris: Secondary | ICD-10-CM

## 2021-06-12 MED ORDER — POTASSIUM CHLORIDE CRYS ER 20 MEQ PO TBCR: 20.0000 meq | EXTENDED_RELEASE_TABLET | ORAL | 1 refills | Status: DC | PRN

## 2021-06-12 MED ORDER — EMPAGLIFLOZIN 10 MG PO TABS: 10.0000 mg | ORAL_TABLET | Freq: Every day | ORAL | 3 refills | Status: AC

## 2021-06-12 MED ORDER — ENTRESTO 24-26 MG PO TABS: 1.0000 | ORAL_TABLET | Freq: Two times a day (BID) | ORAL | 3 refills | Status: DC

## 2021-06-12 MED ORDER — ASPIRIN EC 81 MG PO TBEC: 81.0000 mg | DELAYED_RELEASE_TABLET | Freq: Every day | ORAL | 11 refills | Status: AC

## 2021-06-12 MED ORDER — ROSUVASTATIN CALCIUM 20 MG PO TABS: 20.0000 mg | ORAL_TABLET | Freq: Every day | ORAL | 0 refills | Status: DC

## 2021-06-12 MED ORDER — NITROGLYCERIN 0.4 MG SL SUBL: 0.4000 mg | SUBLINGUAL_TABLET | SUBLINGUAL | 2 refills | Status: DC | PRN

## 2021-06-12 MED ORDER — FUROSEMIDE 20 MG PO TABS: 20.0000 mg | ORAL_TABLET | Freq: Every day | ORAL | 3 refills | Status: DC | PRN

## 2021-06-12 MED ORDER — CARVEDILOL 12.5 MG PO TABS: 12.5000 mg | ORAL_TABLET | Freq: Two times a day (BID) | ORAL | 1 refills | Status: DC

## 2021-06-12 MED ORDER — SPIRONOLACTONE 25 MG PO TABS: 12.5000 mg | ORAL_TABLET | ORAL | 1 refills | Status: DC

## 2021-06-12 NOTE — Progress Notes (Signed)
?Cardiology Office Note:   ?Date:  06/12/2021  ?NAME:  Micheal Bell    ?MRN: [id number] ?DOB:  Dec 12, 1970  ? ?PCP:  [name], MD  ?Cardiologist:  Evalina Field, MD  ?Electrophysiologist:  None  ? ?Referring MD: [name], MD  ? ?Chief Complaint  ?Patient presents with  ? Follow-up  ?   ?  ? ?History of Present Illness:   ?Micheal Bell is a 51 y.o. male with a hx of CAD status post PCI, diabetes, stroke, systolic heart failure with recovery of ejection fraction who presents for follow-up.  Seems to be doing well.  Going to the [facility].  Doing an exercise bike for up to 20 minutes on most days without any chest pain or trouble breathing.  Blood pressure is a bit low but appears to be on duplicate medications.  We will correct these.  His A1c is 6.7.  LDL cholesterol 47.  All of his values are at goal.  Still using smokeless tobacco products.  I have discouraged this.  Overall he is doing really well.  Denies any concerning symptoms for angina.  We will get an occasional twinge in his chest but this is nothing major.  Can get short of breath with incline activity.  I encouraged him to continue to do this.  CV exam is normal. ? ?Problem List ?1. CAD ?-abnormal stress/cardiomyopathy 01/10/2020 ?-3v CAD 02/12/2020: 90% PDA, 90% LCX; 95% LAD ?-CABG deferred due to deconditioning ?-2v PCI -> LAD/LCX 02/20/2020 ?2. Systolic HF with recovery of EF ?-EF 30-35% in setting of sepsis ?-EF improved to 55% at discharge (stress related) ?-insetting of MSSA sepsis/foot abscess/covid 19 PNA/cerebritis  ?-EF 55-60% 08/06/2020 ?3. DM ?-A1c 6.7 ?4. HTN ?5. CVA ?-prior L frontal lobe ?6. HLD ?-T chol 120, HDL 50, LDL 47, TG 131 ?7. DVT ?-R popliteal vein eliquis started 11/11/2019 ?-completed 3 months therapy  ?8. Smokeless tobacco ?-Dip user for 45 years  ? ?Past Medical History: ?Past Medical History:  ?Diagnosis Date  ? Anxiety   ? Arthritis   ? Asthma   ? inhaler daily  ? CHF (congestive heart failure) ([place])   ? Coronary  artery disease   ? Depression   ? Diabetes mellitus without complication ([place])   ? DVT (deep venous thrombosis) ([place])   ? Dyspnea   ? Fibromyalgia   ? Gout   ? History of kidney stones   ? Hypertension   ? IBS (irritable bowel syndrome)   ? Myocardial infarction [facility]) 09/2019  ? Pneumonia 09/2019  ? Stroke [facility]) 09/2019  ? Vein disorder   ? ? ?Past Surgical History: ?Past Surgical History:  ?Procedure Laterality Date  ? CORONARY ATHERECTOMY N/A 02/20/2020  ? Procedure: CORONARY ATHERECTOMY;  Surgeon: Martinique, [name], MD;  Location: [place] [facility];  Service: Cardiovascular;  Laterality: N/A;  CSI LAD  ? CORONARY STENT INTERVENTION N/A 02/20/2020  ? Procedure: CORONARY STENT INTERVENTION;  Surgeon: Martinique, [name], MD;  Location: [place] [facility];  Service: Cardiovascular;  Laterality: N/A;  LAD ?CFX  ? FOOT SURGERY    ? INTRAVASCULAR ULTRASOUND/IVUS N/A 02/20/2020  ? Procedure: Intravascular Ultrasound/IVUS;  Surgeon: Martinique, [name], MD;  Location: [place] [facility];  Service: Cardiovascular;  Laterality: N/A;  ? LEFT HEART CATH AND CORONARY ANGIOGRAPHY N/A 02/12/2020  ? Procedure: LEFT HEART CATH AND CORONARY ANGIOGRAPHY;  Surgeon: Sherren Mocha, MD;  Location: [place] [facility];  Service: Cardiovascular;  Laterality: N/A;  ?  REVERSE SHOULDER ARTHROPLASTY Right 10/17/2020  ? Procedure: REVERSE SHOULDER ARTHROPLASTY;  Surgeon: Tania Ade, MD;  Location: [facility];  Service: Orthopedics;  Laterality: Right;  ? TEE WITHOUT CARDIOVERSION N/A 10/24/2019  ? Procedure: TRANSESOPHAGEAL ECHOCARDIOGRAM (TEE);  Surgeon: Josue Hector, MD;  Location: [facility];  Service: Cardiovascular;  Laterality: N/A;  ? ? ?Current Medications: ?Current Meds  ?Medication Sig  ? ACCU-CHEK GUIDE test strip Use to check blood glucose twice daily  ? Accu-Chek Softclix Lancets lancets 2 (two) times daily.  ? albuterol (PROVENTIL HFA;VENTOLIN HFA) 108 (90 Base) MCG/ACT inhaler Inhale 1-2 puffs into the lungs every 4 (four) hours  as needed for wheezing or shortness of breath.  ? albuterol (PROVENTIL) (2.5 MG/3ML) 0.083% nebulizer solution Take 2.5 mg by nebulization 3 (three) times daily as needed for wheezing or shortness of breath.  ? allopurinol (ZYLOPRIM) 300 MG tablet Take 300 mg by mouth daily.  ? buPROPion (WELLBUTRIN XL) 150 MG 24 hr tablet Take 150 mg by mouth every morning.  ? ciclopirox (PENLAC) 8 % solution Apply topically at bedtime. Apply over nail and surrounding skin. Apply daily over previous coat. After seven (7) days, may remove with alcohol and continue cycle.  ? cyclobenzaprine (FLEXERIL) 10 MG tablet Take 0.5 tablets (5 mg total) by mouth 2 (two) times daily as needed for muscle spasms. (Patient taking differently: Take 10 mg by mouth 3 (three) times daily.)  ? fluticasone (FLONASE) 50 MCG/ACT nasal spray Place 1 spray into both nostrils daily as needed for allergies.  ? gabapentin (NEURONTIN) 300 MG capsule Take 1,200 mg by mouth 2 (two) times daily.  ? HYDROmorphone (DILAUDID) 4 MG tablet Take 1 tablet (4 mg total) by mouth in the morning, at noon, and at bedtime.  ? insulin glargine (LANTUS) 100 UNIT/ML Solostar Pen Inject 20 Units into the skin daily. (Patient taking differently: Inject 30 Units into the skin every evening.)  ? lidocaine (XYLOCAINE) 5 % ointment APPLY TO AFFECTED AREA TOPICALLY AS NEEDED  ? miconazole (MICATIN) 2 % cream Apply under the right arm pit 2 times daily  ? Misc. Devices (RECONSTITUBE) MISC Nebulizer, tubing  J45.30  Fax to [employer]  ? mometasone-formoterol (DULERA) 200-5 MCG/ACT AERO Inhale 1 puff into the lungs 2 (two) times daily.  ? montelukast (SINGULAIR) 10 MG tablet Take 10 mg by mouth at bedtime.  ? Multiple Vitamins-Minerals (MULTIVITAMIN WITH MINERALS) tablet Take 1 tablet by mouth daily.  ? Nebulizers (COMP AIR COMPRESSOR NEBULIZER) MISC USE AS DIRECTED INCLUDE TUBING J45.30  ? nortriptyline (PAMELOR) 25 MG capsule Take 25 mg by mouth at bedtime.  ? pantoprazole (PROTONIX)  40 MG tablet Take 40 mg by mouth daily.  ? PENTIPS 32G X 4 MM MISC See admin instructions. with insulin  ? RESTASIS 0.05 % ophthalmic emulsion Place 1 drop into both eyes 2 (two) times daily.  ? tobramycin-dexamethasone (TOBRADEX) ophthalmic ointment Place 1 application into both eyes daily as needed (eye infection).  ? triamcinolone (KENALOG) 0.1 % Apply 1 application topically 2 (two) times daily as needed (irritation).  ? zolpidem (AMBIEN) 10 MG tablet Take 10 mg by mouth at bedtime.  ? [DISCONTINUED] aspirin EC 81 MG tablet Take 1 tablet (81 mg total) by mouth daily. Swallow whole.  ? [DISCONTINUED] carvedilol (COREG) 12.5 MG tablet Take 12.5 mg by mouth 2 (two) times daily with a meal.  ? [DISCONTINUED] clopidogrel (PLAVIX) 75 MG tablet Take 75 mg by mouth daily in the afternoon.  ? [DISCONTINUED] empagliflozin (JARDIANCE) 10  MG TABS tablet Take 1 tablet (10 mg total) by mouth daily before breakfast.  ? [DISCONTINUED] furosemide (LASIX) 20 MG tablet Take 1 tablet (20 mg total) by mouth daily as needed for fluid or edema (weight gain of 3 lbs or more in a day or 5 lbs in a week).  ? [DISCONTINUED] isosorbide mononitrate (IMDUR) 30 MG 24 hr tablet TAKE ONE TABLET BY MOUTH EVERY DAY  ? [DISCONTINUED] nitroGLYCERIN (NITROSTAT) 0.4 MG SL tablet Place 0.4 mg under the tongue every 5 (five) minutes as needed for chest pain.  ? [DISCONTINUED] potassium chloride SA (KLOR-CON) 20 MEQ tablet Take 20 mEq by mouth daily.  ? [DISCONTINUED] rosuvastatin (CRESTOR) 20 MG tablet TAKE ONE TABLET BY MOUTH EVERY DAY  ? [DISCONTINUED] sacubitril-valsartan (ENTRESTO) 24-26 MG TAKE ONE TABLET BY MOUTH TWICE DAILY  ? [DISCONTINUED] spironolactone (ALDACTONE) 25 MG tablet Take 0.5 tablets (12.5 mg total) by mouth every other day. (Patient taking differently: Take 12.5 mg by mouth daily.)  ?  ? ?Allergies:    ?Azithromycin and Lodine [etodolac]  ? ?Social History: ?Social History  ? ?Socioeconomic History  ? Marital status: Single  ?   Spouse name: Not on file  ? Number of children: Not on file  ? Years of education: Not on file  ? Highest education level: Not on file  ?Occupational History  ? Occupation: disabled  ?Tobacco Use  ? Smoking

## 2021-06-12 NOTE — Patient Instructions (Signed)
Medication Instructions:  ?See medication list for updated medications- if you are taking any medications not on your medication list, please stop them.  ? ?*If you need a refill on your cardiac medications before your next appointment, please call your pharmacy* ? ?Follow-Up: ?At [facility], you and your health needs are our priority.  As part of our continuing mission to provide you with exceptional heart care, we have created designated Provider Care Teams.  These Care Teams include your primary Cardiologist (physician) and Advanced Practice Providers (APPs -  Physician Assistants and Nurse Practitioners) who all work together to provide you with the care you need, when you need it. ? ?We recommend signing up for the patient portal called "MyChart".  Sign up information is provided on this After Visit Summary.  MyChart is used to connect with patients for Virtual Visits (Telemedicine).  Patients are able to view lab/test results, encounter notes, upcoming appointments, etc.  Non-urgent messages can be sent to your provider as well.   ?To learn more about what you can do with MyChart, go to [email or web address].   ? ?Your next appointment:   ?12 month(s) ? ?The format for your next appointment:   ?In Person ? ?Provider:   ?[name], MD   ? ? ?Important Information About Sugar ? ? ? ? ? ? ?

## 2021-08-07 ENCOUNTER — Encounter: Payer: Self-pay | Admitting: Adult Health

## 2021-08-07 ENCOUNTER — Ambulatory Visit: Payer: Medicaid Other | Admitting: Adult Health

## 2021-08-07 VITALS — BP 118/77 | HR 65 | Ht 74.0 in | Wt 275.0 lb

## 2021-08-07 DIAGNOSIS — G5621 Lesion of ulnar nerve, right upper limb: Secondary | ICD-10-CM | POA: Diagnosis not present

## 2021-08-07 DIAGNOSIS — I639 Cerebral infarction, unspecified: Secondary | ICD-10-CM

## 2021-08-07 DIAGNOSIS — U071 COVID-19: Secondary | ICD-10-CM

## 2021-08-07 DIAGNOSIS — G629 Polyneuropathy, unspecified: Secondary | ICD-10-CM | POA: Diagnosis not present

## 2021-08-07 NOTE — Progress Notes (Signed)
[facility] [address] [place]. [place] [address] [phone]       OFFICE FOLLOW UP VISITT NOTE  [name] Date of Birth:  1970-10-25 Medical Record Number:  [id number]   Referring MD:  Benedetto Goad  Reason for Referral: Stroke  HPI: Initial visit 05/01/2020 [name] :Micheal Bell is a 51 year old Caucasian male with history of diabetes, hypertension, gout, irritable bowel syndrome, fibromyalgia, obesity who is seen today for office consultation visit for his stroke and hand weakness.  History is obtained from the patient and his uncle who is accompanying him as well as review of electronic medical records and I have personally reviewed pertinent imaging films in PACS. Patient initially was admitted on 10/08/2020 for Covid pneumonia to the floor and decompensated on 10/11/2020 requiring intubation.  He self extubated himself on 10/15/2020 but required reintubation.  He subsequently was extubated on 10/20/2020 and a typical therapy for Covid including steroids, twice daily CT neb and remdesivir.  Hospitalization was complicated by MSSA pneumonia and group B Streptococcus bacteremia due to left foot abscess.  MRI of the foot showed left foot abscess.  And systolic heart failure.  TEE was negative for vegetations.  He developed some right-sided weakness and confusion and MRI scan of the brain was obtained on 10/25/2019 which showed a 17 mm focus of restricted diffusion in the left frontoparietal subcortical region which showed ring like enhancement on postcontrast raising suspicion for subacute infarct versus cerebritis.  MR angiogram of the brain on 10/29/2019 showed no large vessel stenosis or occlusion.  EEG showed mild diffuse slowing without definite epileptiform activity.  He was started on Keppra for seizure prophylaxis which she seems to be tolerating well without side effects.  Patient was not on aspirin at the time.  Patient states he initially did well and recovered his  right upper extremity strength after discharge.  However he was admitted with chest pain on 02/20/2020 and was found to have multivessel coronary artery disease and underwent emergent 3 coronary stents.  Following which she had severe pain in the right elbow with swelling and subsequently right hand weakness.  He is now developed clawing of the right ulnar fingers with inability to extend them with weakness and numbness that.  He did finish home physical and occupational therapy.  Is able to ambulate independently but still has persistent right hand weakness and slight dragging of the right leg.  He has not undergone any outpatient therapies.  He states he has no further seizures.  Update 08/05/2020 [name] : He returns for follow-up after last visit 3 months ago.  He continues to have significant weakness and wasting of his right hand as well as gait and balance difficulties.  He had EMG nerve conduction study done on 04/24/2020 by [name] confirmed underlying diabetic neuropathy as well as mononeuropathy of the right ulnar nerve which was severe in the right upper extremity but difficult to localize exactly.  There was evidence of mild median neuropathy bilaterally as well.  MRI scan of the brain done at [facility] on 05/15/2020 showed a small chronic left centrum semiovale infarct with small focus of microhemorrhage and mild changes of small vessel disease.  Patient is tolerating aspirin Plavix well without bruising or bleeding.  He states blood pressure is under good control today it is 110/75.  His blood sugars are also under good control.  He still has paresthesias in his feet particularly at night despite taking gabapentin 300 mg 3  times daily.  He has no new complaints.  Still on Keppra which was started for seizure prophylaxis in the hospital but has not had any seizures.  Update 02/03/2021 JM: Patient returns for 71-month follow-up visit accompanied by his uncle.  Stable from stroke standpoint  without new stroke/TIA symptoms.  Continued gait difficulties with balance impairment stable without worsening.  Continued use of cane without any recent falls. Repeat EEG negative and since discontinued Keppra. Uncle does mention last week patient seemed to be off during the day with increased forgetfulness and gait unsteadiness. Patient does add in saying he remembers not feeling well that day. Reports being seen by pulmonologist that same day but no mention of these concerns during that visit. Denies any speech difficulties, weakness, numbness/tingling, visual changes or headache. Felt back to his normal self the following day.  S/p right shoulder arthroplasty 8/25 by Dr. Ave Filter - completed therapies. Some elbow pain but this improved after restarting his prior arthritic medication meloxicam. His right hand is still contracted. C/o BLE neuropathy which he reports is being managed by podiatry.  He is currently on multiple medications for pain/neuropathy including gabapentin 1200 mg BID, hydromorphone 4 mg 3 times daily, nortriptyline 25 mg nightly, lidocaine ointment, and meloxicam.   No further concerns at this time    Update 08/07/2021 JM: Patient returns for 66-month stroke follow-up accompanied by his uncle.  Overall stable without new stroke/TIA symptoms.  Continued gait impairment with imbalance, stable.  He does have occasional bowel incontinence while sleeping, recent colonoscopy which was normal per patient (unable to view).  Continued use of cane, has had a couple falls since last visit when outdoors on uneven surfaces, did not hit head. Goes to they [employer] 3x weekly and does pool therapy. Continued right hand contracture and neuropathy. Following with neurosurgery and looking at undergoing spinal cord stimulator.  Compliant on aspirin and Crestor, denies side effects.  Blood pressure today 118/77.  Continues to follow with endo for DM management with prior A1c 6.7 (04/2021).  Routinely follows  with PCP and cardiology.  No further concerns at this time       ROS:   14 system review of systems is positive for those listed in HPI and all other systems negative    PMH:  Past Medical History:  Diagnosis Date   Anxiety    Arthritis    Asthma    inhaler daily   CHF (congestive heart failure) (HCC)    Coronary artery disease    Depression    Diabetes mellitus without complication (HCC)    DVT (deep venous thrombosis) (HCC)    Dyspnea    Fibromyalgia    Gout    History of kidney stones    Hypertension    IBS (irritable bowel syndrome)    Myocardial infarction (HCC) 09/2019   Pneumonia 09/2019   Stroke (HCC) 09/2019   Vein disorder     Social History:  Social History   Socioeconomic History   Marital status: Single    Spouse name: Not on file   Number of children: Not on file   Years of education: Not on file   Highest education level: Not on file  Occupational History   Occupation: disabled  Tobacco Use   Smoking status: Never   Smokeless tobacco: Current    Types: Snuff  Vaping Use   Vaping Use: Never used  Substance and Sexual Activity   Alcohol use: Never   Drug use: Never   Sexual  activity: Not on file  Other Topics Concern   Not on file  Social History Narrative   Lives alone, mom is next door   Right handed   Drinks no caffeine   Social Determinants of Health   Financial Resource Strain: Not on file  Food Insecurity: Not on file  Transportation Needs: Not on file  Physical Activity: Not on file  Stress: Not on file  Social Connections: Not on file  Intimate Partner Violence: Not on file    Medications:   Current Outpatient Medications on File Prior to Visit  Medication Sig Dispense Refill   ACCU-CHEK GUIDE test strip Use to check blood glucose twice daily     Accu-Chek Softclix Lancets lancets 2 (two) times daily.     albuterol (PROVENTIL HFA;VENTOLIN HFA) 108 (90 Base) MCG/ACT inhaler Inhale 1-2 puffs into the lungs every 4  (four) hours as needed for wheezing or shortness of breath.     albuterol (PROVENTIL) (2.5 MG/3ML) 0.083% nebulizer solution Take 2.5 mg by nebulization 3 (three) times daily as needed for wheezing or shortness of breath.     allopurinol (ZYLOPRIM) 300 MG tablet Take 300 mg by mouth daily.     aspirin EC 81 MG tablet Take 1 tablet (81 mg total) by mouth daily. Swallow whole. 30 tablet 11   buPROPion (WELLBUTRIN XL) 150 MG 24 hr tablet Take 150 mg by mouth every morning.     carvedilol (COREG) 12.5 MG tablet Take 1 tablet (12.5 mg total) by mouth 2 (two) times daily with a meal. 180 tablet 1   ciclopirox (PENLAC) 8 % solution Apply topically at bedtime. Apply over nail and surrounding skin. Apply daily over previous coat. After seven (7) days, may remove with alcohol and continue cycle. 6.6 mL 2   cyclobenzaprine (FLEXERIL) 10 MG tablet Take 0.5 tablets (5 mg total) by mouth 2 (two) times daily as needed for muscle spasms. (Patient taking differently: Take 10 mg by mouth 3 (three) times daily.) 15 tablet 0   empagliflozin (JARDIANCE) 10 MG TABS tablet Take 1 tablet (10 mg total) by mouth daily before breakfast. 90 tablet 3   fluticasone (FLONASE) 50 MCG/ACT nasal spray Place 1 spray into both nostrils daily as needed for allergies.     furosemide (LASIX) 20 MG tablet Take 1 tablet (20 mg total) by mouth daily as needed for fluid or edema (weight gain of 3 lbs or more in a day or 5 lbs in a week). 30 tablet 3   gabapentin (NEURONTIN) 300 MG capsule Take 1,200 mg by mouth 2 (two) times daily.     HYDROmorphone (DILAUDID) 4 MG tablet Take 1 tablet (4 mg total) by mouth in the morning, at noon, and at bedtime. 6 tablet 0   insulin glargine (LANTUS) 100 UNIT/ML Solostar Pen Inject 20 Units into the skin daily. (Patient taking differently: Inject 30 Units into the skin every evening.) 15 mL 0   lidocaine (XYLOCAINE) 5 % ointment APPLY TO AFFECTED AREA TOPICALLY AS NEEDED 50 g 3   meloxicam (MOBIC) 15 MG  tablet Take 15 mg by mouth daily.     miconazole (MICATIN) 2 % cream Apply under the right arm pit 2 times daily 28 g 1   Misc. Devices (RECONSTITUBE) MISC Nebulizer, tubing  J45.30  Fax to [employer]     mometasone-formoterol (DULERA) 200-5 MCG/ACT AERO Inhale 1 puff into the lungs 2 (two) times daily.     montelukast (SINGULAIR) 10 MG tablet Take 10  mg by mouth at bedtime.     Multiple Vitamins-Minerals (MULTIVITAMIN WITH MINERALS) tablet Take 1 tablet by mouth daily.     Nebulizers (COMP AIR COMPRESSOR NEBULIZER) MISC USE AS DIRECTED INCLUDE TUBING J45.30     nitroGLYCERIN (NITROSTAT) 0.4 MG SL tablet Place 1 tablet (0.4 mg total) under the tongue every 5 (five) minutes as needed for chest pain. 25 tablet 2   nortriptyline (PAMELOR) 25 MG capsule Take 25 mg by mouth at bedtime.     pantoprazole (PROTONIX) 40 MG tablet Take 40 mg by mouth daily.     PENTIPS 32G X 4 MM MISC See admin instructions. with insulin     potassium chloride SA (KLOR-CON M) 20 MEQ tablet Take 1 tablet (20 mEq total) by mouth as needed. With Lasix 90 tablet 1   RESTASIS 0.05 % ophthalmic emulsion Place 1 drop into both eyes 2 (two) times daily.     rosuvastatin (CRESTOR) 20 MG tablet Take 1 tablet (20 mg total) by mouth daily. 90 tablet 0   sacubitril-valsartan (ENTRESTO) 24-26 MG Take 1 tablet by mouth 2 (two) times daily. 180 tablet 3   spironolactone (ALDACTONE) 25 MG tablet Take 0.5 tablets (12.5 mg total) by mouth every other day. 60 tablet 1   tobramycin-dexamethasone (TOBRADEX) ophthalmic ointment Place 1 application into both eyes daily as needed (eye infection).     triamcinolone (KENALOG) 0.1 % Apply 1 application topically 2 (two) times daily as needed (irritation).     zolpidem (AMBIEN) 10 MG tablet Take 10 mg by mouth at bedtime.     Current Facility-Administered Medications on File Prior to Visit  Medication Dose Route Frequency Provider Last Rate Last Admin   regadenoson (LEXISCAN) injection SOLN 0.4 mg   0.4 mg Intravenous Once O'Neal, Ronnald Ramp, MD        Allergies:   Allergies  Allergen Reactions   Azithromycin     Blisters in mouth    Lodine [Etodolac] Anaphylaxis    Physical Exam Today's Vitals   08/07/21 1101  BP: 118/77  Pulse: 65  Weight: 275 lb (124.7 kg)  Height: 6\' 2"  (1.88 m)   Body mass index is 35.31 kg/m.    General: well developed, well nourished pleasant middle-aged mildly obese Caucasian male, seated, in no evident distress Head: head normocephalic and atraumatic.   Neck: supple with no carotid or supraclavicular bruits Cardiovascular: regular rate and rhythm, no murmurs Musculoskeletal: no deformity Skin:  no rash/petichiae Vascular:  Normal pulses all extremities  Neurologic Exam Mental Status: Awake and fully alert.  Fluent speech and language.  Oriented to place and time. Recent and remote memory intact. Attention span, concentration and fund of knowledge appropriate. Mood and affect appropriate.  Cranial Nerves: Pupils equal, briskly reactive to light. Extraocular movements full without nystagmus. Visual fields full to confrontation. Hearing intact. Facial sensation intact. Face, tongue, palate moves normally and symmetrically.  Motor: Diminished bulk in the right hand intrinsic muscles lumbricals and interossei with wasting.  Clawing of the ulnar 3 digits with nonfixed contractures.  Weakness of right grip and intrinsic hand muscles.  Normal strength in other extremities Sensory.:  Diminished touch pinprick sensation in the right hand in the ulnar nerve mediated dermatome.  Positive Tinel sign over the right ulnar groove.  Decreased sensation BLE Coordination: Rapid alternating movements normal in all extremities. Finger-to-nose and heel-to-shin performed accurately bilaterally. Gait and Station: Arises from chair with mild difficulty. Stance is normal. Gait demonstrates slight dragging of the right leg  and stiffness with use of cane.  Not able to  heel, toe and tandem walk without difficulty.  Reflexes: 1+ and symmetric. Toes downgoing.       ASSESSMENT: 51 year old Caucasian male with Covid infection in 09/2020 complicated by Covid pneumonia, sepsis and left frontal small subcortical infarct etiology septic emboli versus Covid related hypercoagulability.  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 and he also has underlying diabetic chronic polyneuropathy.     PLAN:  Left frontal stroke -Continue on aspirin and Crestor for stroke prevention given history of multiple cardiac stents.  -Continue close PCP follow-up for aggressive stroke risk factor management to maintain aggressive risk factor management including BP goal<130/90, HLD with LDL goal<70 and DM with A1c.<7   Chronic polyneuropathy/pain Ulnar neuropathy of elbow -elbow neuropathy stable -Currently being managed by PCP/podiatry and is now being seen by neurosurgery -Continued use of cane at all times for fall prevention     Doing well from stroke standpoint without further recommendations and risk factors are managed by PCP. He may follow up PRN, as usual for our patients who are strictly being followed for stroke. If any new neurological issues should arise, request PCP place referral for evaluation by one of our neurologists. Thank you.      CC:  [name], MD  [name]  I spent 34 minutes of face-to-face and non-face-to-face time with patient and uncle.  This included previsit chart review, lab review, study review, electronic health record documentation, patient and uncle education and discussion re: hx of prior stroke, secondary stroke prevention measures and aggressive stroke risk factor management, chronic polyneuropathy and pain, and answered all other questions to patients and uncles satisfaction   Ihor Austin, AGNP-BC  [name] [address] [place], [place]  [address]  Phone [phone] Fax [phone] Note: This document was prepared with digital dictation and possible smart phrase technology. Any transcriptional errors that result from this process are unintentional.

## 2021-08-07 NOTE — Patient Instructions (Signed)
Continue participating at the [facility]   Continue to follow with neurosurgery as scheduled   Continue aspirin 81 mg daily  and Crestor  for secondary stroke prevention  Continue to follow up with PCP regarding cholesterol, diabetes and blood pressure management  Maintain strict control of hypertension with blood pressure goal below 130/90, diabetes with hemoglobin A1c goal below 7.0 % and cholesterol with LDL cholesterol (bad cholesterol) goal below 70 mg/dL.   Signs of a Stroke? Follow the BEFAST method:  Balance Watch for a sudden loss of balance, trouble with coordination or vertigo Eyes Is there a sudden loss of vision in one or both eyes? Or double vision?  Face: Ask the person to smile. Does one side of the face droop or is it numb?  Arms: Ask the person to raise both arms. Does one arm drift downward? Is there weakness or numbness of a leg? Speech: Ask the person to repeat a simple phrase. Does the speech sound slurred/strange? Is the person confused ? Time: If you observe any of these signs, call [phone].         Thank you for coming to see [place] at [facility]. I hope we have been able to provide you high quality care today.  You may receive a patient satisfaction survey over the next few weeks. We would appreciate your feedback and comments so that we may continue to improve ourselves and the health of our patients.

## 2021-08-18 ENCOUNTER — Telehealth: Payer: Self-pay

## 2021-08-19 ENCOUNTER — Ambulatory Visit (INDEPENDENT_AMBULATORY_CARE_PROVIDER_SITE_OTHER): Payer: Medicaid Other | Admitting: Podiatry

## 2021-08-19 ENCOUNTER — Ambulatory Visit (INDEPENDENT_AMBULATORY_CARE_PROVIDER_SITE_OTHER): Payer: Medicaid Other

## 2021-08-19 DIAGNOSIS — M79674 Pain in right toe(s): Secondary | ICD-10-CM

## 2021-08-19 DIAGNOSIS — E11621 Type 2 diabetes mellitus with foot ulcer: Secondary | ICD-10-CM | POA: Diagnosis not present

## 2021-08-19 DIAGNOSIS — L97422 Non-pressure chronic ulcer of left heel and midfoot with fat layer exposed: Secondary | ICD-10-CM

## 2021-08-19 DIAGNOSIS — M79675 Pain in left toe(s): Secondary | ICD-10-CM | POA: Diagnosis not present

## 2021-08-19 DIAGNOSIS — B351 Tinea unguium: Secondary | ICD-10-CM | POA: Diagnosis not present

## 2021-08-19 MED ORDER — DOXYCYCLINE HYCLATE 100 MG PO TABS: 100.0000 mg | ORAL_TABLET | Freq: Two times a day (BID) | ORAL | 0 refills | Status: DC

## 2021-08-20 ENCOUNTER — Telehealth: Payer: Self-pay | Admitting: *Deleted

## 2021-08-21 ENCOUNTER — Telehealth: Payer: Self-pay | Admitting: *Deleted

## 2021-08-21 NOTE — Telephone Encounter (Signed)
Patient is calling to see if he can get a few supplies from our office for his foot, [facility] contacted and said that insurance will not cover, on a fixed income and cannot afford. Please advise.

## 2021-08-23 NOTE — Progress Notes (Signed)
  Subjective:  Patient ID: Micheal Bell, male    DOB: 19-Oct-1970,  MRN: [id number]  Chief Complaint  Patient presents with   Nail Problem    Thick painful toenails, 3 month follow up     51 y.o. male returns with the above complaint. History confirmed with patient.  Nails are thickened and elongated causing discomfort again.  Has a new spot on the left foot that is bothersome and has had some drainage   Objective:  Physical Exam: warm, good capillary refill, normal DP and PT pulses, reduced sensation grossly throughout the left lower extremity, onychomycosis x10 with yellow and elongated thickened nail plates, worst of which is the right hallux Left Foot: Recurrent ulceration submetatarsal 5 measures 2.8 x 2.5 x 0.3 cm with exposed subcutaneous tissue and Peri ulcer erythema some serous drainage no purulence no exposed bone tendon or joint     Assessment:   1. Diabetic ulcer of left midfoot associated with type 2 diabetes mellitus, with fat layer exposed (HCC)   2. Pain due to onychomycosis of toenails of both feet       Plan:  Patient was evaluated and treated and all questions answered.     Discussed the etiology and treatment options for the condition in detail with the patient. Educated patient on the topical and oral treatment options for mycotic nails. Recommended debridement of the nails today. Sharp and mechanical debridement performed of all painful and mycotic nails today. Nails debrided in length and thickness using a nail nipper to level of comfort. Discussed treatment options including appropriate shoe gear. Follow up as needed for painful nails.  Continue Penlac   Ulcer left foot -We discussed the etiology and factors that are a part of the wound healing process.  We also discussed the risk of infection both soft tissue and osteomyelitis from open ulceration.  Discussed the risk of limb loss if this happens or worsens. -Debridement as below. -Dressed with  Prisma, DSD. -Continue home dressing changes  3 times weekly with 4 x 4 gauze and Prisma -Continue off-loading with extra depth diabetic shoes with a custom molded multi density insole. -Vascular testing not indicated he has palpable pulses -HgbA1c: Last A1c 6.7% in March he is due for another one soon he says -Last antibiotics: Rx for doxycycline sent to pharmacy -Imaging: x-ray reviewed, shows no signs of erosions, osteolysis, osteomyelitis or emphysema.  Procedure: Excisional Debridement of Wound Rationale: Removal of non-viable soft tissue from the wound to promote healing.  Anesthesia: none Post-Debridement Wound Measurements: 2.0 cm x 2.5 cm x 0.3 cm  Type of Debridement: Sharp Excisional Tissue Removed: Non-viable soft tissue Depth of Debridement: subcutaneous tissue. Technique: Sharp excisional debridement to bleeding, viable wound base.  Dressing: Dry, sterile, compression dressing. Disposition: Patient tolerated procedure well.    Return in about 3 weeks (around 09/09/2021) for wound care.

## 2021-08-27 ENCOUNTER — Other Ambulatory Visit: Payer: Self-pay | Admitting: Pain Medicine

## 2021-08-27 DIAGNOSIS — M546 Pain in thoracic spine: Secondary | ICD-10-CM

## 2021-08-27 DIAGNOSIS — G8929 Other chronic pain: Secondary | ICD-10-CM

## 2021-09-02 ENCOUNTER — Other Ambulatory Visit: Payer: Self-pay | Admitting: Cardiovascular Disease

## 2021-09-09 ENCOUNTER — Ambulatory Visit (INDEPENDENT_AMBULATORY_CARE_PROVIDER_SITE_OTHER): Payer: Medicaid Other | Admitting: Podiatry

## 2021-09-09 DIAGNOSIS — L97422 Non-pressure chronic ulcer of left heel and midfoot with fat layer exposed: Secondary | ICD-10-CM

## 2021-09-09 DIAGNOSIS — E11621 Type 2 diabetes mellitus with foot ulcer: Secondary | ICD-10-CM | POA: Diagnosis not present

## 2021-09-09 NOTE — Progress Notes (Signed)
  Subjective:  Patient ID: Micheal Bell, male    DOB: 1970/09/12,  MRN: [id number]  Chief Complaint  Patient presents with   Diabetic Ulcer        WOUND CARE    51 y.o. male returns with the above complaint. History confirmed with patient.  He is doing okay.  Says it looks somewhat better after taking the antibiotics   Objective:  Physical Exam: warm, good capillary refill, normal DP and PT pulses, reduced sensation grossly throughout the left lower extremity, onychomycosis x10 with yellow and elongated thickened nail plates, worst of which is the right hallux Left Foot: Recurrent ulceration submetatarsal 5 measures 2.5x2.0 x 0.5 cm with exposed subcutaneous tissue and today no signs of infection no erythema cellulitis purulence or malodor      Assessment:   No diagnosis found.     Plan:  Patient was evaluated and treated and all questions answered.     Discussed the etiology and treatment options for the condition in detail with the patient. Educated patient on the topical and oral treatment options for mycotic nails. Recommended debridement of the nails today. Sharp and mechanical debridement performed of all painful and mycotic nails today. Nails debrided in length and thickness using a nail nipper to level of comfort. Discussed treatment options including appropriate shoe gear. Follow up as needed for painful nails.  Continue Penlac   Ulcer left foot -We discussed the etiology and factors that are a part of the wound healing process.  We also discussed the risk of infection both soft tissue and osteomyelitis from open ulceration.  Discussed the risk of limb loss if this happens or worsens. -Debridement as below. -Dressed with Hydrofera Blue, DSD. -Continue home dressing changes switch to [employer]	[place] today -Continue off-loading with extra depth diabetic shoes with a custom molded multi density insole. -Vascular testing not indicated he has palpable pulses -HgbA1c:  Last A1c 6.7% in March he is due for another one soon he says -Last antibiotics: No further interval antibiotics indicated  Procedure: Excisional Debridement of Wound Rationale: Removal of non-viable soft tissue from the wound to promote healing.  Anesthesia: none Post-Debridement Wound Measurements: 2.0 cm x 2.5 cm x 0.5 cm Type of Debridement: Sharp Excisional Tissue Removed: Non-viable soft tissue Depth of Debridement: subcutaneous tissue. Technique: Sharp excisional debridement to bleeding, viable wound base.  Dressing: Dry, sterile, compression dressing. Disposition: Patient tolerated procedure well.    Return in about 3 weeks (around 09/30/2021).

## 2021-09-13 ENCOUNTER — Ambulatory Visit
Admission: RE | Admit: 2021-09-13 | Discharge: 2021-09-13 | Disposition: A | Discharge status: Home / Self Care | Payer: Medicaid Other | Source: Ambulatory Visit | Attending: Pain Medicine | Admitting: Pain Medicine

## 2021-09-13 DIAGNOSIS — M546 Pain in thoracic spine: Secondary | ICD-10-CM

## 2021-09-29 ENCOUNTER — Other Ambulatory Visit: Payer: Self-pay | Admitting: Cardiovascular Disease

## 2021-09-30 ENCOUNTER — Ambulatory Visit (INDEPENDENT_AMBULATORY_CARE_PROVIDER_SITE_OTHER): Payer: Medicaid Other

## 2021-09-30 ENCOUNTER — Ambulatory Visit (INDEPENDENT_AMBULATORY_CARE_PROVIDER_SITE_OTHER): Payer: Medicaid Other | Admitting: Podiatry

## 2021-09-30 DIAGNOSIS — L97422 Non-pressure chronic ulcer of left heel and midfoot with fat layer exposed: Secondary | ICD-10-CM | POA: Diagnosis not present

## 2021-09-30 DIAGNOSIS — E11621 Type 2 diabetes mellitus with foot ulcer: Secondary | ICD-10-CM

## 2021-10-02 NOTE — Progress Notes (Signed)
  Subjective:  Patient ID: Micheal Bell, male    DOB: 08-Feb-1971,  MRN: [id number]  Chief Complaint  Patient presents with   Diabetic Ulcer    Left midfoot, 3 week follow up    51 y.o. male returns with the above complaint. History confirmed with patient.  Says that is doing okay has had some new odor   Objective:  Physical Exam: warm, good capillary refill, normal DP and PT pulses, reduced sensation grossly throughout the left lower extremity, onychomycosis x10 with yellow and elongated thickened nail plates, worst of which is the right hallux Left Foot: Recurrent ulceration submetatarsal 5 measures 2.2 x 1.8 x 0.5 cm with exposed subcutaneous tissue and today there is mild malodor, serous drainage, no erythema cellulitis or exposed bone or tendon    Assessment:   1. Diabetic ulcer of left midfoot associated with type 2 diabetes mellitus, with fat layer exposed (HCC)        Plan:  Patient was evaluated and treated and all questions answered.     Discussed the etiology and treatment options for the condition in detail with the patient. Educated patient on the topical and oral treatment options for mycotic nails. Recommended debridement of the nails today. Sharp and mechanical debridement performed of all painful and mycotic nails today. Nails debrided in length and thickness using a nail nipper to level of comfort. Discussed treatment options including appropriate shoe gear. Follow up as needed for painful nails.  Continue [place]   Ulcer left foot -We discussed the etiology and factors that are a part of the wound healing process.  We also discussed the risk of infection both soft tissue and osteomyelitis from open ulceration.  Discussed the risk of limb loss if this happens or worsens. -Debridement as below. -Dressed with Iodosorb and silver fiber gel dressing  -Continue home dressing changes switch to silver fiber gel absorbent dressing -Continue off-loading with extra  depth diabetic shoes with a custom molded multi density insole.  I think likely will need to to think about a total contact cast soon -Vascular testing not indicated he has palpable pulses -HgbA1c: Last A1c 6.7% in March he is due for another one soon he says -Last antibiotics: No further interval antibiotics indicated  Procedure: Excisional Debridement of Wound Rationale: Removal of non-viable soft tissue from the wound to promote healing.  Anesthesia: none Post-Debridement Wound Measurements: 2.2 cm x 1.8 cm x 0.5 cm Type of Debridement: Sharp Excisional Tissue Removed: Non-viable soft tissue Depth of Debridement: subcutaneous tissue. Technique: Sharp excisional debridement to bleeding, viable wound base.  Dressing: Dry, sterile, compression dressing. Disposition: Patient tolerated procedure well.    Return in about 2 weeks (around 10/14/2021) for wound care.

## 2021-10-14 ENCOUNTER — Ambulatory Visit (INDEPENDENT_AMBULATORY_CARE_PROVIDER_SITE_OTHER): Payer: Medicaid Other | Admitting: Podiatry

## 2021-10-14 DIAGNOSIS — L97422 Non-pressure chronic ulcer of left heel and midfoot with fat layer exposed: Secondary | ICD-10-CM

## 2021-10-14 DIAGNOSIS — E11621 Type 2 diabetes mellitus with foot ulcer: Secondary | ICD-10-CM

## 2021-10-14 NOTE — Progress Notes (Signed)
  Subjective:  Patient ID: GLENMORE KARL, male    DOB: December 08, 1970,  MRN: [id number]  Chief Complaint  Patient presents with   Diabetic Ulcer    2 week follow up left midfoot    51 y.o. male returns with the above complaint. History confirmed with patient.  Has been using the Iodosorb and silver alginate   Objective:  Physical Exam: warm, good capillary refill, normal DP and PT pulses, reduced sensation grossly throughout the left lower extremity, onychomycosis x10 with yellow and elongated thickened nail plates, worst of which is the right hallux Left Foot: Recurrent ulceration submetatarsal 5 measures 1.3 x 1.5 x 0.3 cm with exposed subcutaneous tissue and today there is no malodor, minimal drainage, no erythema cellulitis or exposed bone or tendon    Assessment:   1. Diabetic ulcer of left midfoot associated with type 2 diabetes mellitus, with fat layer exposed (HCC)         Plan:  Patient was evaluated and treated and all questions answered.    Ulcer left foot -We discussed the etiology and factors that are a part of the wound healing process.  We also discussed the risk of infection both soft tissue and osteomyelitis from open ulceration.  Discussed the risk of limb loss if this happens or worsens. -Debridement as below. -Dressed with Iodosorb and silver fiber gel dressing  -Continue home dressing changes with silver alginate and Iodosorb -Continue off-loading with extra depth diabetic shoes with a custom molded multi density insole.  -Vascular testing not indicated he has palpable pulses -HgbA1c: Last A1c 6.7% in March he is due for another one soon he says -Last antibiotics: No further interval antibiotics indicated  Procedure: Excisional Debridement of Wound Rationale: Removal of non-viable soft tissue from the wound to promote healing.  Anesthesia: none Post-Debridement Wound Measurements: 1.3 cm x 1.5 cm x 0.3 cm Type of Debridement: Sharp Excisional Tissue  Removed: Non-viable soft tissue Depth of Debridement: subcutaneous tissue. Technique: Sharp excisional debridement to bleeding, viable wound base.  Dressing: Dry, sterile, compression dressing. Disposition: Patient tolerated procedure well.    Return in about 3 weeks (around 11/04/2021) for wound care.

## 2021-10-21 ENCOUNTER — Encounter: Payer: Self-pay | Admitting: Cardiovascular Disease

## 2021-10-22 ENCOUNTER — Telehealth: Payer: Self-pay | Admitting: Cardiovascular Disease

## 2021-10-22 NOTE — Telephone Encounter (Signed)
Patient called 8/30 23 . Appointment made 10/28/21 Patient aware to GO to ER if NTG x 3 gives no relief.

## 2021-10-22 NOTE — Telephone Encounter (Signed)
  Pt c/o of Chest Pain: STAT if CP now or developed within 24 hours  1. Are you having CP right now? no  2. Are you experiencing any other symptoms (ex. SOB, nausea, vomiting, sweating)? SOB on exertion   3. How long have you been experiencing CP? yesterday  4. Is your CP continuous or coming and going? coming and going  5. Have you taken Nitroglycerin? yes ?   Having chest pain, taking nitroglycerin helps when hurt. Stabbing in back.

## 2021-10-22 NOTE — Telephone Encounter (Signed)
LMTCB

## 2021-10-22 NOTE — Telephone Encounter (Signed)
Spoke to patient . Patient states he has used NTG tablet  in the past and it  did relieve his discomfort. Patient states last time he had chest pain yesterday  he used NTG x 2     Stabbing pain  he could not give a pian scale. No nausea, radiation to his back ,  no diaphoresis    Offered appointment 10/29/21 at 2:45 pm.  patient is aware in pain persist try NTG  X 3 with no relieve  call 911 go to ER

## 2021-10-28 ENCOUNTER — Ambulatory Visit: Payer: Medicaid Other | Attending: Physician Assistant | Admitting: Nurse Practitioner

## 2021-10-28 ENCOUNTER — Encounter: Payer: Self-pay | Admitting: Physician Assistant

## 2021-10-28 VITALS — BP 118/72 | HR 68 | Ht 74.0 in | Wt 275.4 lb

## 2021-10-28 DIAGNOSIS — I110 Hypertensive heart disease with heart failure: Secondary | ICD-10-CM

## 2021-10-28 DIAGNOSIS — E785 Hyperlipidemia, unspecified: Secondary | ICD-10-CM

## 2021-10-28 DIAGNOSIS — I2510 Atherosclerotic heart disease of native coronary artery without angina pectoris: Secondary | ICD-10-CM | POA: Diagnosis not present

## 2021-10-28 DIAGNOSIS — I5032 Chronic diastolic (congestive) heart failure: Secondary | ICD-10-CM

## 2021-10-28 DIAGNOSIS — R079 Chest pain, unspecified: Secondary | ICD-10-CM

## 2021-10-28 DIAGNOSIS — Z01810 Encounter for preprocedural cardiovascular examination: Secondary | ICD-10-CM

## 2021-10-28 DIAGNOSIS — I10 Essential (primary) hypertension: Secondary | ICD-10-CM

## 2021-10-28 DIAGNOSIS — I200 Unstable angina: Secondary | ICD-10-CM

## 2021-10-28 NOTE — Patient Instructions (Addendum)
Medication Instructions:  Your physician recommends that you continue on your current medications as directed. Please refer to the Current Medication list given to you today.  *If you need a refill on your cardiac medications before your next appointment, please call your pharmacy*  Lab Work: Your physician recommends that you return for lab work TODAY:  BMP CBC  If you have labs (blood work) drawn today and your tests are completely normal, you will receive your results only by: MyChart Message (if you have MyChart) OR A paper copy in the mail If you have any lab test that is abnormal or we need to change your treatment, we will call you to review the results.  Testing/Procedures: Your physician has requested that you have a cardiac catheterization. Cardiac catheterization is used to diagnose and/or treat various heart conditions. Doctors may recommend this procedure for a number of different reasons. The most common reason is to evaluate chest pain. Chest pain can be a symptom of coronary artery disease (CAD), and cardiac catheterization can show whether plaque is narrowing or blocking your heart's arteries. This procedure is also used to evaluate the valves, as well as measure the blood flow and oxygen levels in different parts of your heart. For further information please visit [email or web address] Please follow instruction sheet, as given.  Scheduled for Wednesday 11/05/21 at 8:30 AM with Dr. Herbie Baltimore  Follow-Up: At [facility], you and your health needs are our priority.  As part of our continuing mission to provide you with exceptional heart care, we have created designated Provider Care Teams.  These Care Teams include your primary Cardiologist (physician) and Advanced Practice Providers (APPs -  Physician Assistants and Nurse Practitioners) who all work together to provide you with the care you need, when you need it.  Your next appointment:   1-2 week(s)  The format for  your next appointment:   In Person  Provider:   APP        Other Instructions  [place] A DEPT OF [place]. [facility] [address] A DEPT OF [place]. [facility] [address] [id number] [facility] [place] [address] Dept: [phone] Loc: [phone]  PARKS CZAJKOWSKI  10/28/2021  You are scheduled for a Cardiac Catheterization on Wednesday, September 13 with Dr. Bryan Lemma.  1. Please arrive at the [facility] (Main Entrance A) at [facility]: [address] [place], [place] [address] at 6:30 AM (This time is two hours before your procedure to ensure your preparation). Free valet parking service is available.   Special note: Every effort is made to have your procedure done on time. Please understand that emergencies sometimes delay scheduled procedures.  2. Diet: Do not eat solid foods after midnight.  The patient may have clear liquids until 5am upon the day of the procedure.  3. Labs: You will need to have blood drawn on Friday, September 8 at [facility] [address], [place]  Open: 8am - 5pm (Lunch 12:30 - 1:30)   Phone: [phone]. You do not need to be fasting.  4. Medication instructions in preparation for your procedure:   Contrast Allergy: No  Stop taking, Lasix (Furosemide)  Tuesday, September 12, and RESTART Thursday September 14 Stop Potassium Tuesday, September 12, and RESTART Thursday September 14  Hold Lantus on Tuesday September 12 Hold Spiroloactone  on Tuesday September 12 Hold Jardiance on Tuesday September 12 Hold Entresto morning of heart Cath  On the morning of your procedure, take your Aspirin and any morning medicines NOT listed above.  You may use sips of water.  5. Plan for one night stay--bring personal belongings. 6. Bring a current list of your medications and current insurance cards. 7. You MUST have a responsible person to drive you home. 8. Someone MUST  be with you the first 24 hours after you arrive home or your discharge will be delayed. 9. Please wear clothes that are easy to get on and off and wear slip-on shoes.  Thank you for allowing [place] to care for you!   -- [place] [facility]   Important Information About Sugar

## 2021-10-28 NOTE — Progress Notes (Unsigned)
Cardiology Office Note:    Date:  10/29/2021   ID:  Micheal Bell, DOB [birth year], MRN [id number]  PCP:  Micheal Banner, MD   [facility] Providers Cardiologist:  Micheal Harps, MD     Referring MD: Micheal Banner, MD   CC: Worsening chest pain  History of Present Illness:    Micheal Bell is a 51 y.o. male with a hx of the following:  CAD, s/p PCI of proximal to distal LAD with orbital atherectomy and DES X 3; successful PCI of Lcx/Om1 with long DES X1 in 2021 T2DM Hx of stroke Systolic CHF (LVEF 55-60%) 07/2020 HTN HLD  Past cardiac history includes coronary artery disease, status post PCI in 2021, history of stroke, diabetes, and systolic heart failure with recovery of ejection fraction.  Most recent echocardiogram in June 2022 revealed LVEF 55 to 60%, grade 1 diastolic dysfunction, trivial mitral valve regurgitation all other findings normal. Was found to be doing well at his last office visit with Dr. Flora Bell in April 2023.  Was using smokeless tobacco products at that time.  Overall doing well from a cardiac perspective.  Denied any angina-said he was getting occasional twinge in his chest but was nothing major.  Will get shortness of breath with incline activity.   Today he presents with a chief complaint of chest pain. States it has been occurring intermittently since his past cardiac catheterization in 2021 but has been worsening for the past month or two.  It is difficult for him to tell if this chest pain is similar to before when he had his heart catheterization in 2021.  Says nothing really triggers it, occurs intermittently, but chest pain gets worse when walking up a hill.  Says since the past month or 2, it has gone from occurring every couple days to now every day, getting worse over time.  During these episodes wife who is present says he gets real sweaty.  Patient describes chest pain as stabbing, left side of his chest, that radiates to his back and goes  down his left arm.  Describes it as pretty severe when it happens states it is 10 out of 10 in intensity.  Says the pain will lighten up after taking nitroglycerin however sometimes goes away on its own.  Most nitroglycerin he is ever taken is 2 tablets in a day.  States he has tried taking pain medication and that does not help.  States episodes last a couple minutes in duration.  Denies any nausea, vomiting, palpitations, shortness of breath, swelling, significant weight changes, syncope, presyncope, dizziness, orthopnea, or PND. Denies any other questions or concerns.   Past Medical History:  Diagnosis Date   Anxiety    Arthritis    Asthma    inhaler daily   CHF (congestive heart failure) (HCC)    Coronary artery disease    Depression    Diabetes mellitus without complication (HCC)    DVT (deep venous thrombosis) (HCC)    Dyspnea    Fibromyalgia    Gout    History of kidney stones    Hypertension    IBS (irritable bowel syndrome)    Myocardial infarction (HCC) 09/2019   Pneumonia 09/2019   Stroke (HCC) 09/2019   Vein disorder     Past Surgical History:  Procedure Laterality Date   CORONARY ATHERECTOMY N/A 02/20/2020   Procedure: CORONARY ATHERECTOMY;  Surgeon: [name], [name], MD;  Location: [facility] INVASIVE CV LAB;  Service: Cardiovascular;  Laterality: N/A;  CSI LAD   CORONARY STENT INTERVENTION N/A 02/20/2020   Procedure: CORONARY STENT INTERVENTION;  Surgeon: Martinique, [name], MD;  Location: [facility] CV LAB;  Service: Cardiovascular;  Laterality: N/A;  LAD CFX   FOOT SURGERY     INTRAVASCULAR ULTRASOUND/IVUS N/A 02/20/2020   Procedure: Intravascular Ultrasound/IVUS;  Surgeon: Martinique, [name], MD;  Location: [facility] CV LAB;  Service: Cardiovascular;  Laterality: N/A;   LEFT HEART CATH AND CORONARY ANGIOGRAPHY N/A 02/12/2020   Procedure: LEFT HEART CATH AND CORONARY ANGIOGRAPHY;  Surgeon: Micheal Mocha, MD;  Location: [facility] CV LAB;  Service: Cardiovascular;   Laterality: N/A;   REVERSE SHOULDER ARTHROPLASTY Right 10/17/2020   Procedure: REVERSE SHOULDER ARTHROPLASTY;  Surgeon: Micheal Ade, MD;  Location: [facility] ORS;  Service: Orthopedics;  Laterality: Right;   TEE WITHOUT CARDIOVERSION N/A 10/24/2019   Procedure: TRANSESOPHAGEAL ECHOCARDIOGRAM (TEE);  Surgeon: Micheal Hector, MD;  Location: [facility] ENDOSCOPY;  Service: Cardiovascular;  Laterality: N/A;    Current Medications: Current Meds  Medication Sig   ACCU-CHEK GUIDE test strip Use to check blood glucose twice daily   Accu-Chek Softclix Lancets lancets 2 (two) times daily.   albuterol (PROVENTIL HFA;VENTOLIN HFA) 108 (90 Base) MCG/ACT inhaler Inhale 1-2 puffs into the lungs every 4 (four) hours as needed for wheezing or shortness of breath.   albuterol (PROVENTIL) (2.5 MG/3ML) 0.083% nebulizer solution Take 2.5 mg by nebulization 3 (three) times daily as needed for wheezing or shortness of breath.   allopurinol (ZYLOPRIM) 300 MG tablet Take 300 mg by mouth daily.   aspirin EC 81 MG tablet Take 1 tablet (81 mg total) by mouth daily. Swallow whole.   buPROPion (WELLBUTRIN XL) 150 MG 24 hr tablet Take 150 mg by mouth every morning.   carvedilol (COREG) 12.5 MG tablet TAKE ONE TABLET BY MOUTH TWICE DAILY WITH A MEAL   ciclopirox (PENLAC) 8 % solution Apply topically at bedtime. Apply over nail and surrounding skin. Apply daily over previous coat. After seven (7) days, may remove with alcohol and continue cycle.   cyclobenzaprine (FLEXERIL) 10 MG tablet Take 0.5 tablets (5 mg total) by mouth 2 (two) times daily as needed for muscle spasms. (Patient taking differently: Take 10 mg by mouth 3 (three) times daily.)   doxycycline (VIBRA-TABS) 100 MG tablet Take 1 tablet (100 mg total) by mouth 2 (two) times daily.   empagliflozin (JARDIANCE) 10 MG TABS tablet Take 1 tablet (10 mg total) by mouth daily before breakfast.   fluticasone (FLONASE) 50 MCG/ACT nasal spray Place 1 spray into both nostrils daily as  needed for allergies.   furosemide (LASIX) 20 MG tablet Take 1 tablet (20 mg total) by mouth daily as needed for fluid or edema (weight gain of 3 lbs or more in a day or 5 lbs in a week).   gabapentin (NEURONTIN) 300 MG capsule Take 1,200 mg by mouth 2 (two) times daily.   HYDROmorphone (DILAUDID) 4 MG tablet Take 1 tablet (4 mg total) by mouth in the morning, at noon, and at bedtime.   insulin glargine (LANTUS) 100 UNIT/ML Solostar Pen Inject 20 Units into the skin daily. (Patient taking differently: Inject 30 Units into the skin every evening.)   lidocaine (XYLOCAINE) 5 % ointment APPLY TO AFFECTED AREA TOPICALLY AS NEEDED   meloxicam (MOBIC) 15 MG tablet Take 15 mg by mouth daily.   Misc. Devices (RECONSTITUBE) MISC Nebulizer, tubing  J45.30  Fax to [employer]   mometasone-formoterol (DULERA) 200-5 MCG/ACT  AERO Inhale 1 puff into the lungs 2 (two) times daily.   montelukast (SINGULAIR) 10 MG tablet Take 10 mg by mouth at bedtime.   Multiple Vitamins-Minerals (MULTIVITAMIN WITH MINERALS) tablet Take 1 tablet by mouth daily.   Nebulizers (COMP AIR COMPRESSOR NEBULIZER) MISC USE AS DIRECTED INCLUDE TUBING J45.30   nitroGLYCERIN (NITROSTAT) 0.4 MG SL tablet Place 1 tablet (0.4 mg total) under the tongue every 5 (five) minutes as needed for chest pain.   nortriptyline (PAMELOR) 25 MG capsule Take 25 mg by mouth at bedtime.   pantoprazole (PROTONIX) 40 MG tablet Take 40 mg by mouth daily.   PENTIPS 32G X 4 MM MISC See admin instructions. with insulin   potassium chloride SA (KLOR-CON M) 20 MEQ tablet TAKE ONE TABLET BY MOUTH AS NEEDED WITH lasix   RESTASIS 0.05 % ophthalmic emulsion Place 1 drop into both eyes 2 (two) times daily.   rosuvastatin (CRESTOR) 20 MG tablet Take 1 tablet (20 mg total) by mouth daily.   sacubitril-valsartan (ENTRESTO) 24-26 MG Take 1 tablet by mouth 2 (two) times daily.   spironolactone (ALDACTONE) 25 MG tablet Take 0.5 tablets (12.5 mg total) by mouth every other day.    tobramycin-dexamethasone (TOBRADEX) ophthalmic ointment Place 1 application into both eyes daily as needed (eye infection).   triamcinolone (KENALOG) 0.1 % Apply 1 application topically 2 (two) times daily as needed (irritation).   zolpidem (AMBIEN) 10 MG tablet Take 10 mg by mouth at bedtime.     Allergies:   Azithromycin and Lodine [etodolac]   Social History   Socioeconomic History   Marital status: Single    Spouse name: Not on file   Number of children: Not on file   Years of education: Not on file   Highest education level: Not on file  Occupational History   Occupation: disabled  Tobacco Use   Smoking status: Never   Smokeless tobacco: Current    Types: Snuff  Vaping Use   Vaping Use: Never used  Substance and Sexual Activity   Alcohol use: Never   Drug use: Never   Sexual activity: Not on file  Other Topics Concern   Not on file  Social History Narrative   Lives alone, mom is next door   Right handed   Drinks no caffeine   Social Determinants of Health   Financial Resource Strain: Not on file  Food Insecurity: Not on file  Transportation Needs: Not on file  Physical Activity: Not on file  Stress: Not on file  Social Connections: Not on file     Family History: The patient's family history includes Diabetes in his mother; Heart disease in his father and mother.  Positive family history of early CAD, bypass surgery, and cardiovascular disease.  ROS:   Review of Systems  Constitutional:  Positive for diaphoresis. Negative for chills, fever, malaise/fatigue and weight loss.       See HPI.  HENT: Negative.    Eyes: Negative.   Respiratory: Negative.    Cardiovascular:  Positive for chest pain. Negative for palpitations, orthopnea, claudication, leg swelling and PND.  Gastrointestinal: Negative.   Genitourinary: Negative.   Musculoskeletal: Negative.   Skin: Negative.   Neurological: Negative.   Endo/Heme/Allergies: Negative.    Psychiatric/Behavioral: Negative.     Please see the history of present illness.    All other systems reviewed and are negative.  EKGs/Labs/Other Studies Reviewed:    The following studies were reviewed today:   EKG:  EKG is ordered  today.  The ekg ordered today demonstrates normal sinus rhythm, 68 bpm, otherwise normal EKG.   2D echocardiogram on August 06, 2020:  1. Left ventricular ejection fraction, by estimation, is 55 to 60%. The  left ventricle has normal function. The left ventricle has no regional  wall motion abnormalities. Left ventricular diastolic parameters are  consistent with Grade I diastolic  dysfunction (impaired relaxation).   2. Right ventricular systolic function is normal. The right ventricular  size is normal. Tricuspid regurgitation signal is inadequate for assessing  PA pressure.   3. The mitral valve is abnormal. Trivial mitral valve regurgitation.   4. The aortic valve is tricuspid. Aortic valve regurgitation is not  visualized.   Left heart cath with coronary stent intervention on February 20, 2020: Dist LAD lesion is 70% stenosed. A drug-eluting stent was successfully placed using a STENT RESOLUTE ONYX 2.5X38. Post intervention, there is a 0% residual stenosis. Prox LAD to Mid LAD lesion is 95% stenosed. A drug-eluting stent was successfully placed using a STENT RESOLUTE [id number]. A drug-eluting stent was successfully placed using a STENT RESOLUTE ONYX 3.0X15. Post intervention, there is a 0% residual stenosis. Prox Cx to Mid Cx lesion is 90% stenosed. A drug-eluting stent was successfully placed using a SYNERGY XD 2.50X48. Post intervention, there is a 0% residual stenosis.   1. Successful complex PCI of the proximal to distal LAD with orbital atherectomy and DES x 3 with IVUS guidance. 2. Successful PCI of the LCX/OM1 with long DES x 1.   Plan: DAPT indefinitely due to extensive nature of stents. Will monitor overnight and anticipate DC in am.  We did not treat the PDA lesion as this vessel is small in caliber and diffusely diseased. I would manage this medically.   Left heart cath and coronary angiography on February 12, 2020: 1.  Widely patent left main stent with no stenosis 2.  Patent RCA with mild nonobstructive disease, but severe stenosis of the PDA is present 3.  Severe diffuse stenosis of the left circumflex extending into an obtuse marginal branch (long lesion) 4.  Severe diffuse mid LAD stenosis and moderate distal LAD stenosis 5.  Moderate segmental LV systolic dysfunction with normal LVEDP   With diabetes and three-vessel coronary artery disease, will place cardiac surgical consultation.  The patient's coronary anatomy would also be amenable to multivessel PCI with treatment of the PDA lesion, left circumflex lesion, and mid LAD lesion.  Recommend heart team approach to his care with outpatient surgical consultation as next step.  Nuclear medicine stress test on January 10, 2020: Nuclear stress EF: 34%. There was no ST segment deviation noted during stress. No T wave inversion was noted during stress. The left ventricular ejection fraction is moderately decreased (30-44%). Findings consistent with prior myocardial infarction. This is a high risk study.   1. There is a moderate fixed defect that is medium in size (15-20% of the LV) in the inferior wall base to apex with associated hypokinesis concerning for infarction. 2. No ischemia is present. 3. Moderately reduced LVEF, 34%.  4. Overall, this is a high risk study.   Vascular ultrasound carotid duplex bilateral on October 26, 2019: Right Carotid: The extracranial vessels were near-normal with only minimal  wall thickening or plaque.   Left Carotid: The extracranial vessels were near-normal with only minimal  wall thickening or plaque.   Vertebrals:  Bilateral vertebral arteries demonstrate antegrade flow.  Subclavians: Normal flow hemodynamics were seen in  bilateral subclavian arteries.  Recent Labs: No results found for requested labs within last 365 days.  Recent Lipid Panel    Component Value Date/Time   CHOL 120 05/15/2020 0000   TRIG 131 05/15/2020 0000   HDL 50 05/15/2020 0000   CHOLHDL 2.4 05/15/2020 0000   CHOLHDL 3.9 10/26/2019 0402   VLDL 26 10/26/2019 0402   LDLCALC 47 05/15/2020 0000     Physical Exam:    VS:  BP 118/72   Pulse 68   Ht 6\' 2"  (1.88 m)   Wt 275 lb 6.4 oz (124.9 kg)   SpO2 98%   BMI 35.36 kg/m     Wt Readings from Last 3 Encounters:  10/28/21 275 lb 6.4 oz (124.9 kg)  08/07/21 275 lb (124.7 kg)  06/12/21 279 lb 9.6 oz (126.8 kg)     GEN: Obese 51 y.o. Caucasian male in no acute distress HEENT: Normal NECK: No JVD; No carotid bruits CARDIAC: RRR, no murmurs, rubs, gallops; 2+ peripheral pulses throughout, strong and equal bilaterally RESPIRATORY:  Clear and diminished to auscultation without rales, wheezing or rhonchi  ABDOMEN: Soft, non-tender, non-distended MUSCULOSKELETAL:  No edema; No deformity  SKIN: Warm and dry NEUROLOGIC:  Alert and oriented x 3 PSYCHIATRIC:  Normal affect   ASSESSMENT:    1. Chest pain of uncertain etiology   2. Preprocedural cardiovascular examination   3. Chronic heart failure with preserved ejection fraction ([place])   4. CAD, multiple vessel   5. Hyperlipidemia, unspecified hyperlipidemia type   6. Essential hypertension, benign    PLAN:    In order of problems listed above:  Chest pain of uncertain etiology, pre-procedure lab work and examination Says CP has been going on since this past cardiac catheterization intermittently, has been worsening over the past month or 2 which has prompted him to seek cardiology services.  Has gone from occurring every couple days now every day, taking nitroglycerin most of the time to help relieve episodes.  Does present with some typical features of angina.  This case was discussed with DOD, Dr. Stanford Breed who came to the  examination room to speak with patient and his wife.  Discussed noninvasive stress testing versus cardiac catheterization, and patient favors and is agreeable with cardiac catheterization.  I discussed risks and benefits regarding this procedure as outlined below, and he is agreeable to proceed with cardiac catheterization.  We will obtain CBC and BMET today for preprocedure lab work. Have written sign and held orders. ED precautions discussed.  Shared Decision Making/Informed Consent The risks [stroke (1 in 1000), death (1 in 1000), kidney failure [usually temporary] (1 in 500), bleeding (1 in 200), allergic reaction [possibly serious] (1 in 200)], benefits (diagnostic support and management of coronary artery disease) and alternatives of a cardiac catheterization were discussed in detail with Mr. Abney and he is willing to proceed.   2.  Chronic heart failure with preserved ejection fraction - chronic, improving LVEF was reduced to 30 to 35% in August 2021, most recent echocardiogram in June 2022 revealed improvement in EF to 55 to 60%.  Continue current GDMT, Entresto, carvedilol, Jardiance, and Aldactone.  He will hold Entresto the morning of his cardiac catheterization.  He will hold Jardiance, Lantus, Aldactone, as well as Lasix and his Potassium supplement the day before his cardiac catheterization and may resume the day after the cardiac catheterization. Low sodium diet, fluid restriction <2L, and daily weights encouraged. Educated to contact our office for weight gain of 2 lbs overnight or 5 lbs  in one week.   3.  Coronary artery disease, hyperlipidemia with LDL goal less than 70-chronic, not progressing Reports angina intermittently x2 years since last cardiac catheterization.  Symptoms is worsening over the past month or 2.  Wife also states during these episodes he becomes diaphoretic.  In 2021 he was found to have three-vessel CAD, he was not a candidate for CABG. he is status post PCI to the  LAD and left circumflex in December 2021.  Continue aspirin 81 mg daily and continue nitroglycerin sublingual as needed for chest pain. ED precautions discussed. Will arrange left heart catheterization as mentioned above.   4. HTN - chronic, stable BP stable on exam, 118/72. Continue current medication regimen.  Discussed when to hold medications prior to cardiac catheterization, and he verbalizes understanding.  5. Disposition: Follow-up in 1-2 weeks post cardiac catheterization.   Medication Adjustments/Labs and Tests Ordered: Current medicines are reviewed at length with the patient today.  Concerns regarding medicines are outlined above.  Orders Placed This Encounter  Procedures   CBC   Basic metabolic panel   EKG [id number]   No orders of the defined types were placed in this encounter.   Patient Instructions  Medication Instructions:  Your physician recommends that you continue on your current medications as directed. Please refer to the Current Medication list given to you today.  *If you need a refill on your cardiac medications before your next appointment, please call your pharmacy*  Lab Work: Your physician recommends that you return for lab work TODAY:  BMP CBC  If you have labs (blood work) drawn today and your tests are completely normal, you will receive your results only by: [place] (if you have MyChart) OR A paper copy in the mail If you have any lab test that is abnormal or we need to change your treatment, we will call you to review the results.  Testing/Procedures: Your physician has requested that you have a cardiac catheterization. Cardiac catheterization is used to diagnose and/or treat various heart conditions. Doctors may recommend this procedure for a number of different reasons. The most common reason is to evaluate chest pain. Chest pain can be a symptom of coronary artery disease (CAD), and cardiac catheterization can show whether plaque is  narrowing or blocking your heart's arteries. This procedure is also used to evaluate the valves, as well as measure the blood flow and oxygen levels in different parts of your heart. For further information please visit [email or web address]. Please follow instruction sheet, as given.  Scheduled for Wednesday 11/05/21 at 8:30 AM with Dr. Ellyn Hack  Follow-Up: At [facility], you and your health needs are our priority.  As part of our continuing mission to provide you with exceptional heart care, we have created designated Provider Care Teams.  These Care Teams include your primary Cardiologist (physician) and Advanced Practice Providers (APPs -  Physician Assistants and Nurse Practitioners) who all work together to provide you with the care you need, when you need it.  Your next appointment:   1-2 week(s)  The format for your next appointment:   In Person  Provider:   APP        Other Instructions  [place] A DEPT OF [place]. [facility] [place] [id number] [place] [address] Dept: [place]: [place]  10/28/2021  You are scheduled for a Cardiac Catheterization  on Wednesday, September 13 with Dr. Bryan Lemma.  1. Please arrive at the [facility] (Main Entrance A) at [facility]: [address] [place], [place] [address] at 6:30 AM (This time is two hours before your procedure to ensure your preparation). Free valet parking service is available.   Special note: Every effort is made to have your procedure done on time. Please understand that emergencies sometimes delay scheduled procedures.  2. Diet: Do not eat solid foods after midnight.  The patient may have clear liquids until 5am upon the day of the procedure.  3. Labs: You will need to have blood drawn on Friday, September 8 at [facility] [address],  [place]  Open: 8am - 5pm (Lunch 12:30 - 1:30)   Phone: [phone]. You do not need to be fasting.  4. Medication instructions in preparation for your procedure:   Contrast Allergy: No  Stop taking, Lasix (Furosemide)  Tuesday, September 12, and RESTART Thursday September 14 Stop Potassium Tuesday, September 12, and RESTART Thursday September 14  Hold Lantus on Tuesday September 12 Hold Spiroloactone  on Tuesday September 12 Hold Jardiance on Tuesday September 12 Hold Entresto morning of heart Cath   On the morning of your procedure, take your Aspirin and any morning medicines NOT listed above.  You may use sips of water.  5. Plan for one night stay--bring personal belongings. 6. Bring a current list of your medications and current insurance cards. 7. You MUST have a responsible person to drive you home. 8. Someone MUST be with you the first 24 hours after you arrive home or your discharge will be delayed. 9. Please wear clothes that are easy to get on and off and wear slip-on shoes.  Thank you for allowing [place] to care for you!   -- [facility] Invasive Cardiovascular services   Important Information About Sugar         Signed, Sharlene Dory, NP  10/29/2021 5:09 AM    [facility]

## 2021-10-28 NOTE — H&P (View-Only) (Signed)
Cardiology Office Note:    Date:  10/29/2021   ID:  Micheal Bell, DOB [birth year], MRN [id number]  PCP:  [name], MD   [facility] Providers Cardiologist:  [name], MD     Referring MD: [name], MD   CC: Worsening chest pain  History of Present Illness:    Micheal Bell is a 51 y.o. male with a hx of the following:  CAD, s/p PCI of proximal to distal LAD with orbital atherectomy and DES X 3; successful PCI of Lcx/Om1 with long DES X1 in 2021 T2DM Hx of stroke Systolic CHF (LVEF 55-60%) 07/2020 HTN HLD  Past cardiac history includes coronary artery disease, status post PCI in 2021, history of stroke, diabetes, and systolic heart failure with recovery of ejection fraction.  Most recent echocardiogram in June 2022 revealed LVEF 55 to 60%, grade 1 diastolic dysfunction, trivial mitral valve regurgitation all other findings normal. Was found to be doing well at his last office visit with Dr. Flora Lipps in April 2023.  Was using smokeless tobacco products at that time.  Overall doing well from a cardiac perspective.  Denied any angina-said he was getting occasional twinge in his chest but was nothing major.  Will get shortness of breath with incline activity.   Today he presents with a chief complaint of chest pain. States it has been occurring intermittently since his past cardiac catheterization in 2021 but has been worsening for the past month or two.  It is difficult for him to tell if this chest pain is similar to before when he had his heart catheterization in 2021.  Says nothing really triggers it, occurs intermittently, but chest pain gets worse when walking up a hill.  Says since the past month or 2, it has gone from occurring every couple days to now every day, getting worse over time.  During these episodes wife who is present says he gets real sweaty.  Patient describes chest pain as stabbing, left side of his chest, that radiates to his back and goes  down his left arm.  Describes it as pretty severe when it happens states it is 10 out of 10 in intensity.  Says the pain will lighten up after taking nitroglycerin however sometimes goes away on its own.  Most nitroglycerin he is ever taken is 2 tablets in a day.  States he has tried taking pain medication and that does not help.  States episodes last a couple minutes in duration.  Denies any nausea, vomiting, palpitations, shortness of breath, swelling, significant weight changes, syncope, presyncope, dizziness, orthopnea, or PND. Denies any other questions or concerns.   Past Medical History:  Diagnosis Date   Anxiety    Arthritis    Asthma    inhaler daily   CHF (congestive heart failure) (HCC)    Coronary artery disease    Depression    Diabetes mellitus without complication (HCC)    DVT (deep venous thrombosis) (HCC)    Dyspnea    Fibromyalgia    Gout    History of kidney stones    Hypertension    IBS (irritable bowel syndrome)    Myocardial infarction (HCC) 09/2019   Pneumonia 09/2019   Stroke (HCC) 09/2019   Vein disorder     Past Surgical History:  Procedure Laterality Date   CORONARY ATHERECTOMY N/A 02/20/2020   Procedure: CORONARY ATHERECTOMY;  Surgeon: [name], [name], MD;  Location: [facility] INVASIVE CV LAB;  Service: Cardiovascular;  Laterality: N/A;  CSI LAD   CORONARY STENT INTERVENTION N/A 02/20/2020   Procedure: CORONARY STENT INTERVENTION;  Surgeon: Martinique, [name], MD;  Location: [place] CV LAB;  Service: Cardiovascular;  Laterality: N/A;  LAD CFX   FOOT SURGERY     INTRAVASCULAR ULTRASOUND/IVUS N/A 02/20/2020   Procedure: Intravascular Ultrasound/IVUS;  Surgeon: Martinique, [name], MD;  Location: [place] CV LAB;  Service: Cardiovascular;  Laterality: N/A;   LEFT HEART CATH AND CORONARY ANGIOGRAPHY N/A 02/12/2020   Procedure: LEFT HEART CATH AND CORONARY ANGIOGRAPHY;  Surgeon: Sherren Mocha, MD;  Location: [facility] CV LAB;  Service: Cardiovascular;   Laterality: N/A;   REVERSE SHOULDER ARTHROPLASTY Right 10/17/2020   Procedure: REVERSE SHOULDER ARTHROPLASTY;  Surgeon: Tania Ade, MD;  Location: [facility];  Service: Orthopedics;  Laterality: Right;   TEE WITHOUT CARDIOVERSION N/A 10/24/2019   Procedure: TRANSESOPHAGEAL ECHOCARDIOGRAM (TEE);  Surgeon: Josue Hector, MD;  Location: [facility] ENDOSCOPY;  Service: Cardiovascular;  Laterality: N/A;    Current Medications: Current Meds  Medication Sig   ACCU-CHEK GUIDE test strip Use to check blood glucose twice daily   Accu-Chek Softclix Lancets lancets 2 (two) times daily.   albuterol (PROVENTIL HFA;VENTOLIN HFA) 108 (90 Base) MCG/ACT inhaler Inhale 1-2 puffs into the lungs every 4 (four) hours as needed for wheezing or shortness of breath.   albuterol (PROVENTIL) (2.5 MG/3ML) 0.083% nebulizer solution Take 2.5 mg by nebulization 3 (three) times daily as needed for wheezing or shortness of breath.   allopurinol (ZYLOPRIM) 300 MG tablet Take 300 mg by mouth daily.   aspirin EC 81 MG tablet Take 1 tablet (81 mg total) by mouth daily. Swallow whole.   buPROPion (WELLBUTRIN XL) 150 MG 24 hr tablet Take 150 mg by mouth every morning.   carvedilol (COREG) 12.5 MG tablet TAKE ONE TABLET BY MOUTH TWICE DAILY WITH A MEAL   ciclopirox (PENLAC) 8 % solution Apply topically at bedtime. Apply over nail and surrounding skin. Apply daily over previous coat. After seven (7) days, may remove with alcohol and continue cycle.   cyclobenzaprine (FLEXERIL) 10 MG tablet Take 0.5 tablets (5 mg total) by mouth 2 (two) times daily as needed for muscle spasms. (Patient taking differently: Take 10 mg by mouth 3 (three) times daily.)   doxycycline (VIBRA-TABS) 100 MG tablet Take 1 tablet (100 mg total) by mouth 2 (two) times daily.   empagliflozin (JARDIANCE) 10 MG TABS tablet Take 1 tablet (10 mg total) by mouth daily before breakfast.   fluticasone (FLONASE) 50 MCG/ACT nasal spray Place 1 spray into both nostrils daily as  needed for allergies.   furosemide (LASIX) 20 MG tablet Take 1 tablet (20 mg total) by mouth daily as needed for fluid or edema (weight gain of 3 lbs or more in a day or 5 lbs in a week).   gabapentin (NEURONTIN) 300 MG capsule Take 1,200 mg by mouth 2 (two) times daily.   HYDROmorphone (DILAUDID) 4 MG tablet Take 1 tablet (4 mg total) by mouth in the morning, at noon, and at bedtime.   insulin glargine (LANTUS) 100 UNIT/ML Solostar Pen Inject 20 Units into the skin daily. (Patient taking differently: Inject 30 Units into the skin every evening.)   lidocaine (XYLOCAINE) 5 % ointment APPLY TO AFFECTED AREA TOPICALLY AS NEEDED   meloxicam (MOBIC) 15 MG tablet Take 15 mg by mouth daily.   Misc. Devices (RECONSTITUBE) MISC Nebulizer, tubing  J45.30  Fax to [employer]   mometasone-formoterol (DULERA) 200-5 MCG/ACT  AERO Inhale 1 puff into the lungs 2 (two) times daily.   montelukast (SINGULAIR) 10 MG tablet Take 10 mg by mouth at bedtime.   Multiple Vitamins-Minerals (MULTIVITAMIN WITH MINERALS) tablet Take 1 tablet by mouth daily.   Nebulizers (COMP AIR COMPRESSOR NEBULIZER) MISC USE AS DIRECTED INCLUDE TUBING J45.30   nitroGLYCERIN (NITROSTAT) 0.4 MG SL tablet Place 1 tablet (0.4 mg total) under the tongue every 5 (five) minutes as needed for chest pain.   nortriptyline (PAMELOR) 25 MG capsule Take 25 mg by mouth at bedtime.   pantoprazole (PROTONIX) 40 MG tablet Take 40 mg by mouth daily.   PENTIPS 32G X 4 MM MISC See admin instructions. with insulin   potassium chloride SA (KLOR-CON M) 20 MEQ tablet TAKE ONE TABLET BY MOUTH AS NEEDED WITH lasix   RESTASIS 0.05 % ophthalmic emulsion Place 1 drop into both eyes 2 (two) times daily.   rosuvastatin (CRESTOR) 20 MG tablet Take 1 tablet (20 mg total) by mouth daily.   sacubitril-valsartan (ENTRESTO) 24-26 MG Take 1 tablet by mouth 2 (two) times daily.   spironolactone (ALDACTONE) 25 MG tablet Take 0.5 tablets (12.5 mg total) by mouth every other day.    tobramycin-dexamethasone (TOBRADEX) ophthalmic ointment Place 1 application into both eyes daily as needed (eye infection).   triamcinolone (KENALOG) 0.1 % Apply 1 application topically 2 (two) times daily as needed (irritation).   zolpidem (AMBIEN) 10 MG tablet Take 10 mg by mouth at bedtime.     Allergies:   Azithromycin and Lodine [etodolac]   Social History   Socioeconomic History   Marital status: Single    Spouse name: Not on file   Number of children: Not on file   Years of education: Not on file   Highest education level: Not on file  Occupational History   Occupation: disabled  Tobacco Use   Smoking status: Never   Smokeless tobacco: Current    Types: Snuff  Vaping Use   Vaping Use: Never used  Substance and Sexual Activity   Alcohol use: Never   Drug use: Never   Sexual activity: Not on file  Other Topics Concern   Not on file  Social History Narrative   Lives alone, mom is next door   Right handed   Drinks no caffeine   Social Determinants of Health   Financial Resource Strain: Not on file  Food Insecurity: Not on file  Transportation Needs: Not on file  Physical Activity: Not on file  Stress: Not on file  Social Connections: Not on file     Family History: The patient's family history includes Diabetes in his mother; Heart disease in his father and mother.  Positive family history of early CAD, bypass surgery, and cardiovascular disease.  ROS:   Review of Systems  Constitutional:  Positive for diaphoresis. Negative for chills, fever, malaise/fatigue and weight loss.       See HPI.  HENT: Negative.    Eyes: Negative.   Respiratory: Negative.    Cardiovascular:  Positive for chest pain. Negative for palpitations, orthopnea, claudication, leg swelling and PND.  Gastrointestinal: Negative.   Genitourinary: Negative.   Musculoskeletal: Negative.   Skin: Negative.   Neurological: Negative.   Endo/Heme/Allergies: Negative.    Psychiatric/Behavioral: Negative.     Please see the history of present illness.    All other systems reviewed and are negative.  EKGs/Labs/Other Studies Reviewed:    The following studies were reviewed today:   EKG:  EKG is ordered  today.  The ekg ordered today demonstrates normal sinus rhythm, 68 bpm, otherwise normal EKG.   2D echocardiogram on August 06, 2020:  1. Left ventricular ejection fraction, by estimation, is 55 to 60%. The  left ventricle has normal function. The left ventricle has no regional  wall motion abnormalities. Left ventricular diastolic parameters are  consistent with Grade I diastolic  dysfunction (impaired relaxation).   2. Right ventricular systolic function is normal. The right ventricular  size is normal. Tricuspid regurgitation signal is inadequate for assessing  PA pressure.   3. The mitral valve is abnormal. Trivial mitral valve regurgitation.   4. The aortic valve is tricuspid. Aortic valve regurgitation is not  visualized.   Left heart cath with coronary stent intervention on February 20, 2020: Dist LAD lesion is 70% stenosed. A drug-eluting stent was successfully placed using a STENT RESOLUTE ONYX 2.5X38. Post intervention, there is a 0% residual stenosis. Prox LAD to Mid LAD lesion is 95% stenosed. A drug-eluting stent was successfully placed using a STENT RESOLUTE [id number]. A drug-eluting stent was successfully placed using a STENT RESOLUTE ONYX 3.0X15. Post intervention, there is a 0% residual stenosis. Prox Cx to Mid Cx lesion is 90% stenosed. A drug-eluting stent was successfully placed using a SYNERGY XD 2.50X48. Post intervention, there is a 0% residual stenosis.   1. Successful complex PCI of the proximal to distal LAD with orbital atherectomy and DES x 3 with IVUS guidance. 2. Successful PCI of the LCX/OM1 with long DES x 1.   Plan: DAPT indefinitely due to extensive nature of stents. Will monitor overnight and anticipate DC in am.  We did not treat the PDA lesion as this vessel is small in caliber and diffusely diseased. I would manage this medically.   Left heart cath and coronary angiography on February 12, 2020: 1.  Widely patent left main stent with no stenosis 2.  Patent RCA with mild nonobstructive disease, but severe stenosis of the PDA is present 3.  Severe diffuse stenosis of the left circumflex extending into an obtuse marginal branch (long lesion) 4.  Severe diffuse mid LAD stenosis and moderate distal LAD stenosis 5.  Moderate segmental LV systolic dysfunction with normal LVEDP   With diabetes and three-vessel coronary artery disease, will place cardiac surgical consultation.  The patient's coronary anatomy would also be amenable to multivessel PCI with treatment of the PDA lesion, left circumflex lesion, and mid LAD lesion.  Recommend heart team approach to his care with outpatient surgical consultation as next step.  Nuclear medicine stress test on January 10, 2020: Nuclear stress EF: 34%. There was no ST segment deviation noted during stress. No T wave inversion was noted during stress. The left ventricular ejection fraction is moderately decreased (30-44%). Findings consistent with prior myocardial infarction. This is a high risk study.   1. There is a moderate fixed defect that is medium in size (15-20% of the LV) in the inferior wall base to apex with associated hypokinesis concerning for infarction. 2. No ischemia is present. 3. Moderately reduced LVEF, 34%.  4. Overall, this is a high risk study.   Vascular ultrasound carotid duplex bilateral on October 26, 2019: Right Carotid: The extracranial vessels were near-normal with only minimal  wall thickening or plaque.   Left Carotid: The extracranial vessels were near-normal with only minimal  wall thickening or plaque.   Vertebrals:  Bilateral vertebral arteries demonstrate antegrade flow.  Subclavians: Normal flow hemodynamics were seen in  bilateral subclavian arteries.  Recent Labs: No results found for requested labs within last 365 days.  Recent Lipid Panel    Component Value Date/Time   CHOL 120 05/15/2020 0000   TRIG 131 05/15/2020 0000   HDL 50 05/15/2020 0000   CHOLHDL 2.4 05/15/2020 0000   CHOLHDL 3.9 10/26/2019 0402   VLDL 26 10/26/2019 0402   LDLCALC 47 05/15/2020 0000     Physical Exam:    VS:  BP 118/72   Pulse 68   Ht 6\' 2"  (1.88 m)   Wt 275 lb 6.4 oz (124.9 kg)   SpO2 98%   BMI 35.36 kg/m     Wt Readings from Last 3 Encounters:  10/28/21 275 lb 6.4 oz (124.9 kg)  08/07/21 275 lb (124.7 kg)  06/12/21 279 lb 9.6 oz (126.8 kg)     GEN: Obese 51 y.o. Caucasian male in no acute distress HEENT: Normal NECK: No JVD; No carotid bruits CARDIAC: RRR, no murmurs, rubs, gallops; 2+ peripheral pulses throughout, strong and equal bilaterally RESPIRATORY:  Clear and diminished to auscultation without rales, wheezing or rhonchi  ABDOMEN: Soft, non-tender, non-distended MUSCULOSKELETAL:  No edema; No deformity  SKIN: Warm and dry NEUROLOGIC:  Alert and oriented x 3 PSYCHIATRIC:  Normal affect   ASSESSMENT:    1. Chest pain of uncertain etiology   2. Preprocedural cardiovascular examination   3. Chronic heart failure with preserved ejection fraction ([place])   4. CAD, multiple vessel   5. Hyperlipidemia, unspecified hyperlipidemia type   6. Essential hypertension, benign    PLAN:    In order of problems listed above:  Chest pain of uncertain etiology, pre-procedure lab work and examination Says CP has been going on since this past cardiac catheterization intermittently, has been worsening over the past month or 2 which has prompted him to seek cardiology services.  Has gone from occurring every couple days now every day, taking nitroglycerin most of the time to help relieve episodes.  Does present with some typical features of angina.  This case was discussed with DOD, Dr. Stanford Breed who came to the  examination room to speak with patient and his wife.  Discussed noninvasive stress testing versus cardiac catheterization, and patient favors and is agreeable with cardiac catheterization.  I discussed risks and benefits regarding this procedure as outlined below, and he is agreeable to proceed with cardiac catheterization.  We will obtain CBC and BMET today for preprocedure lab work. Have written sign and held orders. ED precautions discussed.  Shared Decision Making/Informed Consent The risks [stroke (1 in 1000), death (1 in 1000), kidney failure [usually temporary] (1 in 500), bleeding (1 in 200), allergic reaction [possibly serious] (1 in 200)], benefits (diagnostic support and management of coronary artery disease) and alternatives of a cardiac catheterization were discussed in detail with Mr. Salamon and he is willing to proceed.   2.  Chronic heart failure with preserved ejection fraction - chronic, improving LVEF was reduced to 30 to 35% in August 2021, most recent echocardiogram in June 2022 revealed improvement in EF to 55 to 60%.  Continue current GDMT, Entresto, carvedilol, Jardiance, and Aldactone.  He will hold Entresto the morning of his cardiac catheterization.  He will hold Jardiance, Lantus, Aldactone, as well as Lasix and his Potassium supplement the day before his cardiac catheterization and may resume the day after the cardiac catheterization. Low sodium diet, fluid restriction <2L, and daily weights encouraged. Educated to contact our office for weight gain of 2 lbs overnight or 5 lbs  in one week.   3.  Coronary artery disease, hyperlipidemia with LDL goal less than 70-chronic, not progressing Reports angina intermittently x2 years since last cardiac catheterization.  Symptoms is worsening over the past month or 2.  Wife also states during these episodes he becomes diaphoretic.  In 2021 he was found to have three-vessel CAD, he was not a candidate for CABG. he is status post PCI to the  LAD and left circumflex in December 2021.  Continue aspirin 81 mg daily and continue nitroglycerin sublingual as needed for chest pain. ED precautions discussed. Will arrange left heart catheterization as mentioned above.   4. HTN - chronic, stable BP stable on exam, 118/72. Continue current medication regimen.  Discussed when to hold medications prior to cardiac catheterization, and he verbalizes understanding.  5. Disposition: Follow-up in 1-2 weeks post cardiac catheterization.   Medication Adjustments/Labs and Tests Ordered: Current medicines are reviewed at length with the patient today.  Concerns regarding medicines are outlined above.  Orders Placed This Encounter  Procedures   CBC   Basic metabolic panel   EKG [id number]   No orders of the defined types were placed in this encounter.   Patient Instructions  Medication Instructions:  Your physician recommends that you continue on your current medications as directed. Please refer to the Current Medication list given to you today.  *If you need a refill on your cardiac medications before your next appointment, please call your pharmacy*  Lab Work: Your physician recommends that you return for lab work TODAY:  BMP CBC  If you have labs (blood work) drawn today and your tests are completely normal, you will receive your results only by: [facility] (if you have MyChart) OR A paper copy in the mail If you have any lab test that is abnormal or we need to change your treatment, we will call you to review the results.  Testing/Procedures: Your physician has requested that you have a cardiac catheterization. Cardiac catheterization is used to diagnose and/or treat various heart conditions. Doctors may recommend this procedure for a number of different reasons. The most common reason is to evaluate chest pain. Chest pain can be a symptom of coronary artery disease (CAD), and cardiac catheterization can show whether plaque is  narrowing or blocking your heart's arteries. This procedure is also used to evaluate the valves, as well as measure the blood flow and oxygen levels in different parts of your heart. For further information please visit [email or web address]. Please follow instruction sheet, as given.  Scheduled for Wednesday 11/05/21 at 8:30 AM with Dr. Ellyn Hack  Follow-Up: At [facility], you and your health needs are our priority.  As part of our continuing mission to provide you with exceptional heart care, we have created designated Provider Care Teams.  These Care Teams include your primary Cardiologist (physician) and Advanced Practice Providers (APPs -  Physician Assistants and Nurse Practitioners) who all work together to provide you with the care you need, when you need it.  Your next appointment:   1-2 week(s)  The format for your next appointment:   In Person  Provider:   APP        Other Instructions  [place] [facility] DEPT OF [facility]. [facility] [place] [id number] [place] [id number] Dept: [place]: [place]  10/28/2021  You are scheduled for a Cardiac Catheterization  on Wednesday, September 13 with Dr. Bryan Lemma.  1. Please arrive at the [facility] (Main Entrance A) at [facility]: [address] [place], [place] [address] at 6:30 AM (This time is two hours before your procedure to ensure your preparation). Free valet parking service is available.   Special note: Every effort is made to have your procedure done on time. Please understand that emergencies sometimes delay scheduled procedures.  2. Diet: Do not eat solid foods after midnight.  The patient may have clear liquids until 5am upon the day of the procedure.  3. Labs: You will need to have blood drawn on Friday, September 8 at [facility] [address],  [place]  Open: 8am - 5pm (Lunch 12:30 - 1:30)   Phone: [phone]. You do not need to be fasting.  4. Medication instructions in preparation for your procedure:   Contrast Allergy: No  Stop taking, Lasix (Furosemide)  Tuesday, September 12, and RESTART Thursday September 14 Stop Potassium Tuesday, September 12, and RESTART Thursday September 14  Hold Lantus on Tuesday September 12 Hold Spiroloactone  on Tuesday September 12 Hold Jardiance on Tuesday September 12 Hold Entresto morning of heart Cath   On the morning of your procedure, take your Aspirin and any morning medicines NOT listed above.  You may use sips of water.  5. Plan for one night stay--bring personal belongings. 6. Bring a current list of your medications and current insurance cards. 7. You MUST have a responsible person to drive you home. 8. Someone MUST be with you the first 24 hours after you arrive home or your discharge will be delayed. 9. Please wear clothes that are easy to get on and off and wear slip-on shoes.  Thank you for allowing [place] to care for you!   -- [facility] Invasive Cardiovascular services   Important Information About Sugar         Signed, Sharlene Dory, NP  10/29/2021 5:09 AM    [facility]

## 2021-10-29 MED ORDER — SODIUM CHLORIDE 0.9% FLUSH: 3.0000 mL | Freq: Two times a day (BID) | INTRAVENOUS | Status: AC

## 2021-11-01 LAB — BASIC METABOLIC PANEL
BUN/Creatinine Ratio: 21 — ABNORMAL HIGH (ref 9–20)
BUN: 23 mg/dL (ref 6–24)
CO2: 25 mmol/L (ref 20–29)
Calcium: 9.3 mg/dL (ref 8.7–10.2)
Chloride: 96 mmol/L (ref 96–106)
Creatinine, Ser: 1.11 mg/dL (ref 0.76–1.27)
Glucose: 162 mg/dL — ABNORMAL HIGH (ref 70–99)
Potassium: 4.7 mmol/L (ref 3.5–5.2)
Sodium: 137 mmol/L (ref 134–144)
eGFR: 79 mL/min/{1.73_m2} (ref >59)

## 2021-11-01 LAB — CBC
Hematocrit: 39.5 % (ref 37.5–51.0)
Hemoglobin: 13.2 g/dL (ref 13.0–17.7)
MCH: 30.9 pg (ref 26.6–33.0)
MCHC: 33.4 g/dL (ref 31.5–35.7)
MCV: 93 fL (ref 79–97)
Platelets: 151 10*3/uL (ref 150–450)
RBC: 4.27 x10E6/uL (ref 4.14–5.80)
RDW: 13.9 % (ref 11.6–15.4)
WBC: 7.8 10*3/uL (ref 3.4–10.8)

## 2021-11-03 ENCOUNTER — Telehealth: Payer: Self-pay | Admitting: *Deleted

## 2021-11-03 NOTE — Telephone Encounter (Signed)
Patient is calling for the silver alginate,will be out before his upcoming appointment. Can he come in to pick up? Please advise.

## 2021-11-04 ENCOUNTER — Telehealth: Payer: Self-pay | Admitting: *Deleted

## 2021-11-04 NOTE — Telephone Encounter (Signed)
Cardiac Catheterization scheduled at [facility] for: Wednesday November 05, 2021 8:30 AM Arrival time and place: [facility] Main Entrance A at: 6:30 AM  Nothing to eat after midnight prior to procedure, clear liquids until 5 AM day of procedure.  Medication instructions: -Hold:  Jardiance/Insulin-AM of procedure  1/2 usual Insulin HS prior to procedure  Lasix/KCl/Spironolactone -AM of procedure -Except hold medications usual morning medications can be taken with sips of water including aspirin 81 mg.  Confirmed patient has responsible adult to drive home post procedure and be with patient first 24 hours after arriving home.  Patient reports no new symptoms concerning for COVID-19 in the past 10 days.  Reviewed procedure instructions with patient.

## 2021-11-05 ENCOUNTER — Encounter (HOSPITAL_COMMUNITY): Payer: Self-pay | Admitting: Cardiology

## 2021-11-05 ENCOUNTER — Ambulatory Visit (HOSPITAL_COMMUNITY)
Admission: RE | Admit: 2021-11-05 | Discharge: 2021-11-05 | Disposition: A | Discharge status: Home / Self Care | Payer: Medicaid Other | Attending: Cardiology | Admitting: Cardiology

## 2021-11-05 ENCOUNTER — Encounter (HOSPITAL_COMMUNITY)
Admission: RE | Disposition: A | Discharge status: Home / Self Care | Payer: Self-pay | Source: Home / Self Care | Attending: Cardiology

## 2021-11-05 ENCOUNTER — Other Ambulatory Visit: Payer: Self-pay

## 2021-11-05 DIAGNOSIS — Z8673 Personal history of transient ischemic attack (TIA), and cerebral infarction without residual deficits: Secondary | ICD-10-CM | POA: Insufficient documentation

## 2021-11-05 DIAGNOSIS — Z955 Presence of coronary angioplasty implant and graft: Secondary | ICD-10-CM | POA: Insufficient documentation

## 2021-11-05 DIAGNOSIS — F17290 Nicotine dependence, other tobacco product, uncomplicated: Secondary | ICD-10-CM | POA: Diagnosis not present

## 2021-11-05 DIAGNOSIS — I2510 Atherosclerotic heart disease of native coronary artery without angina pectoris: Secondary | ICD-10-CM

## 2021-11-05 DIAGNOSIS — I5022 Chronic systolic (congestive) heart failure: Secondary | ICD-10-CM | POA: Insufficient documentation

## 2021-11-05 DIAGNOSIS — Z794 Long term (current) use of insulin: Secondary | ICD-10-CM | POA: Diagnosis not present

## 2021-11-05 DIAGNOSIS — I25110 Atherosclerotic heart disease of native coronary artery with unstable angina pectoris: Secondary | ICD-10-CM

## 2021-11-05 DIAGNOSIS — Z79899 Other long term (current) drug therapy: Secondary | ICD-10-CM | POA: Insufficient documentation

## 2021-11-05 DIAGNOSIS — E785 Hyperlipidemia, unspecified: Secondary | ICD-10-CM | POA: Diagnosis not present

## 2021-11-05 DIAGNOSIS — I110 Hypertensive heart disease with heart failure: Secondary | ICD-10-CM | POA: Diagnosis not present

## 2021-11-05 DIAGNOSIS — Z7982 Long term (current) use of aspirin: Secondary | ICD-10-CM | POA: Diagnosis not present

## 2021-11-05 DIAGNOSIS — E119 Type 2 diabetes mellitus without complications: Secondary | ICD-10-CM | POA: Diagnosis not present

## 2021-11-05 DIAGNOSIS — R079 Chest pain, unspecified: Secondary | ICD-10-CM

## 2021-11-05 HISTORY — PX: LEFT HEART CATH AND CORONARY ANGIOGRAPHY: CATH118249

## 2021-11-05 LAB — GLUCOSE, CAPILLARY: Glucose-Capillary: 161 mg/dL — ABNORMAL HIGH (ref 70–99)

## 2021-11-05 SURGERY — LEFT HEART CATH AND CORONARY ANGIOGRAPHY
Anesthesia: LOCAL

## 2021-11-05 MED ORDER — HYDRALAZINE HCL 20 MG/ML IJ SOLN: 10.0000 mg | INTRAMUSCULAR | Status: DC | PRN

## 2021-11-05 MED ORDER — LABETALOL HCL 5 MG/ML IV SOLN: 10.0000 mg | INTRAVENOUS | Status: DC | PRN

## 2021-11-05 MED ORDER — VERAPAMIL HCL 2.5 MG/ML IV SOLN
INTRAVENOUS | Status: DC | PRN
  Administered 2021-11-05: 2 mg via INTRA_ARTERIAL

## 2021-11-05 MED ORDER — MIDAZOLAM HCL 2 MG/2ML IJ SOLN
INTRAMUSCULAR | Status: AC
  Filled 2021-11-05: qty 2

## 2021-11-05 MED ORDER — SODIUM CHLORIDE 0.9% FLUSH: 3.0000 mL | Freq: Two times a day (BID) | INTRAVENOUS | Status: DC

## 2021-11-05 MED ORDER — SODIUM CHLORIDE 0.9 % IV SOLN: 250.0000 mL | INTRAVENOUS | Status: DC | PRN

## 2021-11-05 MED ORDER — HEPARIN SODIUM (PORCINE) 1000 UNIT/ML IJ SOLN
INTRAMUSCULAR | Status: AC
  Filled 2021-11-05: qty 10

## 2021-11-05 MED ORDER — ONDANSETRON HCL 4 MG/2ML IJ SOLN: 4.0000 mg | Freq: Four times a day (QID) | INTRAMUSCULAR | Status: DC | PRN

## 2021-11-05 MED ORDER — MIDAZOLAM HCL 2 MG/2ML IJ SOLN
INTRAMUSCULAR | Status: DC | PRN
  Administered 2021-11-05: 1 mg via INTRAVENOUS

## 2021-11-05 MED ORDER — SODIUM CHLORIDE 0.9% FLUSH: 3.0000 mL | INTRAVENOUS | Status: DC | PRN

## 2021-11-05 MED ORDER — IOHEXOL 350 MG/ML SOLN
INTRAVENOUS | Status: DC | PRN
  Administered 2021-11-05: 120 mL

## 2021-11-05 MED ORDER — SODIUM CHLORIDE 0.9 % WEIGHT BASED INFUSION
3.0000 mL/kg/h | INTRAVENOUS | Status: AC
  Administered 2021-11-05: 3 mL/kg/h via INTRAVENOUS

## 2021-11-05 MED ORDER — ACETAMINOPHEN 325 MG PO TABS: 650.0000 mg | ORAL_TABLET | ORAL | Status: DC | PRN

## 2021-11-05 MED ORDER — LIDOCAINE HCL (PF) 1 % IJ SOLN
INTRAMUSCULAR | Status: DC | PRN
  Administered 2021-11-05: 2 mL

## 2021-11-05 MED ORDER — FENTANYL CITRATE (PF) 100 MCG/2ML IJ SOLN
INTRAMUSCULAR | Status: AC
  Filled 2021-11-05: qty 2

## 2021-11-05 MED ORDER — FENTANYL CITRATE (PF) 100 MCG/2ML IJ SOLN
INTRAMUSCULAR | Status: DC | PRN
  Administered 2021-11-05: 25 ug via INTRAVENOUS

## 2021-11-05 MED ORDER — HEPARIN SODIUM (PORCINE) 1000 UNIT/ML IJ SOLN
INTRAMUSCULAR | Status: DC | PRN
  Administered 2021-11-05: 6000 [IU] via INTRAVENOUS

## 2021-11-05 MED ORDER — SODIUM CHLORIDE 0.9 % WEIGHT BASED INFUSION: 1.0000 mL/kg/h | INTRAVENOUS | Status: DC

## 2021-11-05 MED ORDER — VERAPAMIL HCL 2.5 MG/ML IV SOLN
INTRAVENOUS | Status: DC | PRN
  Administered 2021-11-05: 10 mL via INTRA_ARTERIAL

## 2021-11-05 MED ORDER — HEPARIN (PORCINE) IN NACL 1000-0.9 UT/500ML-% IV SOLN
INTRAVENOUS | Status: AC
  Filled 2021-11-05: qty 1000

## 2021-11-05 MED ORDER — HEPARIN (PORCINE) IN NACL 1000-0.9 UT/500ML-% IV SOLN
INTRAVENOUS | Status: DC | PRN
  Administered 2021-11-05 (×2): 500 mL

## 2021-11-05 MED ORDER — LIDOCAINE HCL (PF) 1 % IJ SOLN
INTRAMUSCULAR | Status: AC
  Filled 2021-11-05: qty 30

## 2021-11-05 MED ORDER — VERAPAMIL HCL 2.5 MG/ML IV SOLN
INTRAVENOUS | Status: AC
  Filled 2021-11-05: qty 2

## 2021-11-05 MED ORDER — ASPIRIN 81 MG PO CHEW: 81.0000 mg | CHEWABLE_TABLET | ORAL | Status: DC

## 2021-11-05 SURGICAL SUPPLY — 16 items
CATH 5FR JL4 DIAGNOSTIC (CATHETERS) IMPLANT
CATH DIAG 6FR JL4 (CATHETERS) IMPLANT
CATH INFINITI 5 FR JL3.5 (CATHETERS) IMPLANT
CATH INFINITI JR4 5F (CATHETERS) IMPLANT
CATH LAUNCHER 5F JL3 (CATHETERS) IMPLANT
CATH OPTITORQUE TIG 4.0 5F (CATHETERS) IMPLANT
CATHETER LAUNCHER 5F JL3 (CATHETERS) ×1
DEVICE RAD COMP TR BAND LRG (VASCULAR PRODUCTS) IMPLANT
GLIDESHEATH SLEND SS 6F .021 (SHEATH) IMPLANT
GUIDEWIRE INQWIRE 1.5J.035X260 (WIRE) IMPLANT
INQWIRE 1.5J .035X260CM (WIRE) ×1
KIT HEART LEFT (KITS) ×1 IMPLANT
PACK CARDIAC CATHETERIZATION (CUSTOM PROCEDURE TRAY) ×1 IMPLANT
SHEATH PROBE COVER 6X72 (BAG) IMPLANT
TRANSDUCER W/STOPCOCK (MISCELLANEOUS) ×1 IMPLANT
TUBING CIL FLEX 10 FLL-RA (TUBING) ×1 IMPLANT

## 2021-11-05 NOTE — Progress Notes (Signed)
TR BAND REMOVAL  LOCATION:    right radial  DEFLATED PER PROTOCOL:    Yes.    TIME BAND OFF / DRESSING APPLIED:    1145 gauze dressing applied   SITE UPON ARRIVAL:    Level 0  SITE AFTER BAND REMOVAL:    Level 0  CIRCULATION SENSATION AND MOVEMENT:    Within Normal Limits   Yes.    COMMENTS:   no issues noted  

## 2021-11-05 NOTE — Interval H&P Note (Signed)
History and Physical Interval Note:  11/05/2021 8:17 AM  [name]  has presented today for surgery, with the diagnosis of chest pain - with concern for progressive angina & known CAD with extensive PCI.    The various methods of treatment have been discussed with the patient and family. After consideration of risks, benefits and other options for treatment, the patient has consented to  Procedure(s): LEFT HEART CATH AND CORS/GRAFTS ANGIOGRAPHY (N/A)  PERCUTANEOUS CORONARY INTERVENTION   as a surgical intervention.  The patient's history has been reviewed, patient examined, no change in status, stable for surgery.  I have reviewed the patient's chart and labs.  Questions were answered to the patient's satisfaction.     Cath Lab Visit (complete for each Cath Lab visit)  Clinical Evaluation Leading to the Procedure:   ACS: No.  Non-ACS:    Anginal Classification: CCS III  Anti-ischemic medical therapy: Minimal Therapy (1 class of medications)  Non-Invasive Test Results: No non-invasive testing performed  Prior CABG: No previous CABG   Bryan Lemma

## 2021-11-05 NOTE — Discharge Instructions (Signed)

## 2021-11-05 NOTE — Telephone Encounter (Signed)
Patient came in today, pick up a package of the gelling fiber silver dressing, silver aliginate dressing,almost out.

## 2021-11-10 ENCOUNTER — Ambulatory Visit: Payer: Medicaid Other | Admitting: Physician Assistant

## 2021-11-13 ENCOUNTER — Ambulatory Visit: Payer: Medicaid Other | Attending: Physician Assistant | Admitting: Nurse Practitioner

## 2021-11-13 ENCOUNTER — Encounter: Payer: Self-pay | Admitting: Nurse Practitioner

## 2021-11-13 VITALS — BP 130/86 | HR 69 | Ht 74.0 in | Wt 276.2 lb

## 2021-11-13 DIAGNOSIS — Z8673 Personal history of transient ischemic attack (TIA), and cerebral infarction without residual deficits: Secondary | ICD-10-CM

## 2021-11-13 DIAGNOSIS — I10 Essential (primary) hypertension: Secondary | ICD-10-CM | POA: Diagnosis not present

## 2021-11-13 DIAGNOSIS — E785 Hyperlipidemia, unspecified: Secondary | ICD-10-CM

## 2021-11-13 DIAGNOSIS — I25118 Atherosclerotic heart disease of native coronary artery with other forms of angina pectoris: Secondary | ICD-10-CM

## 2021-11-13 DIAGNOSIS — I5022 Chronic systolic (congestive) heart failure: Secondary | ICD-10-CM | POA: Diagnosis not present

## 2021-11-13 DIAGNOSIS — E119 Type 2 diabetes mellitus without complications: Secondary | ICD-10-CM

## 2021-11-13 MED ORDER — ISOSORBIDE MONONITRATE ER 30 MG PO TB24: 30.0000 mg | ORAL_TABLET | Freq: Every day | ORAL | 3 refills | Status: DC

## 2021-11-13 NOTE — Progress Notes (Addendum)
Office Visit    Patient Name: Micheal Bell Date of Encounter: 11/13/2021  Primary Care Provider:  [name], MD Primary Cardiologist:  [name], MD  Chief Complaint    51 year old male with a history of CAD, chronic systolic heart failure with improved EF, hypertension, hyperlipidemia, type 2 diabetes, DVT, CVA, IBS, fibromyalgia, anxiety, depression, and gout who presents for hospital follow-up related to CAD.  Past Medical History    Past Medical History:  Diagnosis Date   Anxiety    Arthritis    Asthma    inhaler daily   CHF (congestive heart failure) (HCC)    Coronary artery disease    Depression    Diabetes mellitus without complication (HCC)    DVT (deep venous thrombosis) (HCC)    Dyspnea    Fibromyalgia    Gout    History of kidney stones    Hypertension    IBS (irritable bowel syndrome)    Myocardial infarction (HCC) 09/2019   Pneumonia 09/2019   Stroke (HCC) 09/2019   Vein disorder    Past Surgical History:  Procedure Laterality Date   CORONARY ATHERECTOMY N/A 02/20/2020   Procedure: CORONARY ATHERECTOMY;  Surgeon: [name], [name], MD;  Location: [facility] INVASIVE CV LAB;  Service: Cardiovascular;  Laterality: N/A;  CSI LAD   CORONARY STENT INTERVENTION N/A 02/20/2020   Procedure: CORONARY STENT INTERVENTION;  Surgeon: [name], [name], MD;  Location: [facility] INVASIVE CV LAB;  Service: Cardiovascular;  Laterality: N/A;  LAD CFX   FOOT SURGERY     INTRAVASCULAR ULTRASOUND/IVUS N/A 02/20/2020   Procedure: Intravascular Ultrasound/IVUS;  Surgeon: [name], [name], MD;  Location: [facility] INVASIVE CV LAB;  Service: Cardiovascular;  Laterality: N/A;   LEFT HEART CATH AND CORONARY ANGIOGRAPHY N/A 02/12/2020   Procedure: LEFT HEART CATH AND CORONARY ANGIOGRAPHY;  Surgeon: [name], MD;  Location: [facility] INVASIVE CV LAB;  Service: Cardiovascular;  Laterality: N/A;   LEFT HEART CATH AND CORONARY ANGIOGRAPHY N/A 11/05/2021   Procedure: LEFT HEART CATH AND CORONARY  ANGIOGRAPHY;  Surgeon: Marykay Lex, MD;  Location: [facility] INVASIVE CV LAB;  Service: Cardiovascular;  Laterality: N/A;   REVERSE SHOULDER ARTHROPLASTY Right 10/17/2020   Procedure: REVERSE SHOULDER ARTHROPLASTY;  Surgeon: Jones Broom, MD;  Location: [facility] ORS;  Service: Orthopedics;  Laterality: Right;   TEE WITHOUT CARDIOVERSION N/A 10/24/2019   Procedure: TRANSESOPHAGEAL ECHOCARDIOGRAM (TEE);  Surgeon: [name], MD;  Location: [facility] ENDOSCOPY;  Service: Cardiovascular;  Laterality: N/A;    Allergies  Allergies  Allergen Reactions   Azithromycin     Blisters in mouth    Lodine [Etodolac] Anaphylaxis    History of Present Illness    51 year old male with the above past medical history including CAD, chronic systolic heart failure with improved EF, hypertension, hyperlipidemia, type 2 diabetes, DVT, CVA, IBS, fibromyalgia, anxiety, depression, and gout.  He hospitalized in August 2021 with sepsis.  Echocardiogram at the time showed EF 30 to 35% moderately decreased LV function.  Nuclear study in November 2021, was abnormal, EF 34%.  Follow-up cardiac catheterization revealed severe multivessel disease, CABG was deferred due to overall physical deconditioning.  He underwent successful complex PCI of the proximal to distal LAD with orbital atherectomy and DES x3 as well as successful PCI of the LCx/OM1 with a long DESx1.  He was noted to have residual PDA lesion however, this was a small caliber vessel, medical management was advised.  Echocardiogram in 07/2020 showed EF 55 to 60%, normal LV  function, no RWMA, G1 DD, no significant valvular abnormalities.  He was last seen in the office on 10/28/2021 and reported progressive chest pain.  He was referred for cardiac catheterization which revealed patent stents to LAD, patent stent to LCx, 40% pRCA stenosis, and 75% RPDA lesion, stable from prior cath, EF 55-60%.  Ongoing medical therapy was advised.  He presents today for follow-up accompanied by  his mother.  Since his procedure has been stable overall from a cardiac standpoint.  He has had 1 episode of chest discomfort, relieved with nitroglycerin.  Otherwise, he denies any chest pain, dyspnea, edema, PND, orthopnea, weight gain.  Other than his ongoing chest discomfort, he denies any additional concerns today.  Home Medications    Current Outpatient Medications  Medication Sig Dispense Refill   ACCU-CHEK GUIDE test strip Use to check blood glucose twice daily     Accu-Chek Softclix Lancets lancets 2 (two) times daily.     acetaminophen (TYLENOL) 500 MG tablet Take 1,000 mg by mouth every 6 (six) hours as needed for moderate pain.     albuterol (PROVENTIL HFA;VENTOLIN HFA) 108 (90 Base) MCG/ACT inhaler Inhale 1-2 puffs into the lungs every 4 (four) hours as needed for wheezing or shortness of breath.     albuterol (PROVENTIL) (2.5 MG/3ML) 0.083% nebulizer solution Take 2.5 mg by nebulization 3 (three) times daily as needed for wheezing or shortness of breath.     allopurinol (ZYLOPRIM) 300 MG tablet Take 300 mg by mouth daily.     aspirin EC 81 MG tablet Take 1 tablet (81 mg total) by mouth daily. Swallow whole. 30 tablet 11   buPROPion (WELLBUTRIN XL) 150 MG 24 hr tablet Take 150 mg by mouth every morning.     carvedilol (COREG) 12.5 MG tablet TAKE ONE TABLET BY MOUTH TWICE DAILY WITH A MEAL 180 tablet 1   ciclopirox (PENLAC) 8 % solution Apply topically at bedtime. Apply over nail and surrounding skin. Apply daily over previous coat. After seven (7) days, may remove with alcohol and continue cycle. (Patient taking differently: Apply 1 Application topically at bedtime as needed (nail fungus). Apply over nail and surrounding skin. Apply daily over previous coat. After seven (7) days, may remove with alcohol and continue cycle.) 6.6 mL 2   cyclobenzaprine (FLEXERIL) 10 MG tablet Take 0.5 tablets (5 mg total) by mouth 2 (two) times daily as needed for muscle spasms. (Patient taking  differently: Take 5 mg by mouth 3 (three) times daily as needed for muscle spasms.) 15 tablet 0   empagliflozin (JARDIANCE) 10 MG TABS tablet Take 1 tablet (10 mg total) by mouth daily before breakfast. 90 tablet 3   fluticasone (FLONASE) 50 MCG/ACT nasal spray Place 1 spray into both nostrils daily as needed for allergies.     furosemide (LASIX) 20 MG tablet Take 1 tablet (20 mg total) by mouth daily as needed for fluid or edema (weight gain of 3 lbs or more in a day or 5 lbs in a week). 30 tablet 3   gabapentin (NEURONTIN) 300 MG capsule Take 1,200 mg by mouth 2 (two) times daily.     HYDROmorphone (DILAUDID) 4 MG tablet Take 1 tablet (4 mg total) by mouth in the morning, at noon, and at bedtime. 6 tablet 0   insulin glargine (LANTUS) 100 UNIT/ML Solostar Pen Inject 20 Units into the skin daily. (Patient taking differently: Inject 30 Units into the skin at bedtime.) 15 mL 0   isosorbide mononitrate (IMDUR) 30 MG  24 hr tablet Take 1 tablet (30 mg total) by mouth daily. 90 tablet 3   lidocaine (XYLOCAINE) 5 % ointment APPLY TO AFFECTED AREA TOPICALLY AS NEEDED (Patient taking differently: Apply 1 Application topically at bedtime.) 50 g 3   meloxicam (MOBIC) 15 MG tablet Take 15 mg by mouth daily.     Misc. Devices (RECONSTITUBE) MISC Nebulizer, tubing  J45.30  Fax to [employer]     mometasone-formoterol (DULERA) 200-5 MCG/ACT AERO Inhale 1 puff into the lungs 2 (two) times daily.     montelukast (SINGULAIR) 10 MG tablet Take 10 mg by mouth at bedtime.     Multiple Vitamins-Minerals (MULTIVITAMIN WITH MINERALS) tablet Take 1 tablet by mouth daily.     Nebulizers (COMP AIR COMPRESSOR NEBULIZER) MISC USE AS DIRECTED INCLUDE TUBING J45.30     nitroGLYCERIN (NITROSTAT) 0.4 MG SL tablet Place 1 tablet (0.4 mg total) under the tongue every 5 (five) minutes as needed for chest pain. 25 tablet 2   nortriptyline (PAMELOR) 25 MG capsule Take 25 mg by mouth at bedtime.     pantoprazole (PROTONIX) 40 MG tablet  Take 40 mg by mouth daily.     PENTIPS 32G X 4 MM MISC See admin instructions. with insulin     potassium chloride SA (KLOR-CON M) 20 MEQ tablet TAKE ONE TABLET BY MOUTH AS NEEDED WITH lasix 90 tablet 1   Povidone-Iodine (BETADINE EX) Apply 1 Application topically daily.     RESTASIS 0.05 % ophthalmic emulsion Place 1 drop into both eyes 2 (two) times daily as needed (dry eyes).     rosuvastatin (CRESTOR) 20 MG tablet Take 1 tablet (20 mg total) by mouth daily. 90 tablet 0   sacubitril-valsartan (ENTRESTO) 24-26 MG Take 1 tablet by mouth 2 (two) times daily. 180 tablet 3   spironolactone (ALDACTONE) 25 MG tablet Take 0.5 tablets (12.5 mg total) by mouth every other day. 60 tablet 1   triamcinolone (KENALOG) 0.1 % Apply 1 application topically 2 (two) times daily as needed (irritation).     zolpidem (AMBIEN) 10 MG tablet Take 10 mg by mouth at bedtime.     Current Facility-Administered Medications  Medication Dose Route Frequency Provider Last Rate Last Admin   sodium chloride flush (NS) 0.9 % injection 3 mL  3 mL Intravenous Q12H Sharlene Dory, NP       Facility-Administered Medications Ordered in Other Visits  Medication Dose Route Frequency Provider Last Rate Last Admin   regadenoson (LEXISCAN) injection SOLN 0.4 mg  0.4 mg Intravenous Once O'Neal, Ronnald Ramp, MD         Review of Systems    He denies chest pain, palpitations, dyspnea, pnd, orthopnea, n, v, dizziness, syncope, edema, weight gain, or early satiety. All other systems reviewed and are otherwise negative except as noted above.   Physical Exam    VS:  BP 130/86   Pulse 69   Ht 6\' 2"  (1.88 m)   Wt 276 lb 3.2 oz (125.3 kg)   SpO2 97%   BMI 35.46 kg/m  GEN: Well nourished, well developed, in no acute distress. HEENT: normal. Neck: Supple, no JVD, carotid bruits, or masses. Cardiac: RRR, no murmurs, rubs, or gallops. No clubbing, cyanosis, edema.  Radials/DP/PT 2+ and equal bilaterally. R radial cath site without  bruising, bleeding, or hematoma.  Respiratory:  Respirations regular and unlabored, clear to auscultation bilaterally. GI: Obese, soft, nontender, nondistended, BS + x 4. MS: no deformity or atrophy. Skin: warm and dry, no  rash. Neuro:  Strength and sensation are intact. Psych: Normal affect.  Accessory Clinical Findings    ECG personally reviewed by me today - NSR, 69 bpm - no acute changes.   Lab Results  Component Value Date   WBC 7.8 10/31/2021   HGB 13.2 10/31/2021   HCT 39.5 10/31/2021   MCV 93 10/31/2021   PLT 151 10/31/2021   Lab Results  Component Value Date   CREATININE 1.11 10/31/2021   BUN 23 10/31/2021   NA 137 10/31/2021   K 4.7 10/31/2021   CL 96 10/31/2021   CO2 25 10/31/2021   Lab Results  Component Value Date   ALT 18 10/17/2020   AST 22 10/17/2020   ALKPHOS 116 10/17/2020   BILITOT 0.4 10/17/2020   Lab Results  Component Value Date   CHOL 120 05/15/2020   HDL 50 05/15/2020   LDLCALC 47 05/15/2020   TRIG 131 05/15/2020   CHOLHDL 2.4 05/15/2020    Lab Results  Component Value Date   HGBA1C 11.3 (H) 10/26/2019    Assessment & Plan    1. CAD: S/p successful complex PCI of the proximal to distal LAD with orbital atherectomy and DES x3 as well as successful PCI of the LCx/OM1 with a long DESx1 in 2021.  Repeat cath on 10/28/2021 revealed patent stents to LAD, patent stent to LCx, 40% pRCA stenosis, and 75% RPDA lesion, stable from prior cath, EF 55-60%.  Ongoing medical therapy was advised.  Since procedures he has had 1 episode of chest discomfort with nitroglycerin.  He was previously on Imdur 30 mg daily.  It is unclear why this was discontinued but he does note that since stopping Imdur he began noticing more symptoms of angina.  Will restart Imdur 30 mg daily.  Continue to monitor symptoms.  Continue aspirin, carvedilol, Entresto, Jardiance, Lasix, spironolactone, and Crestor.  2. Systolic heart failure with improved EF: Prior EF 30 to 35%.  Most  recent echo in 07/2020 showed EF 55 to 60%, normal LV function, no RWMA, G1 DD, no significant valvular abnormalities.  Euvolemic and well compensated on exam.  Continue current medications as above.  3. Hypertension: BP well controlled. Continue current antihypertensive regimen.   4. Hyperlipidemia: LDL was 47 in 04/2020.  He is due for routine repeat lipids.  We will plan to recheck lipids, LFTs at next follow-up visit.  5. Type 2 diabetes: No recent A1c on file.  Monitored and managed per PCP.  6. H/o CVA: No recurrence.  Continue ASA, Crestor.   7. Disposition: Follow-up in 3 months.     Joylene Grapes, NP 11/13/2021, 4:03 PM

## 2021-11-13 NOTE — Patient Instructions (Signed)
Medication Instructions:  START Imdur 30 mg daily  *If you need a refill on your cardiac medications before your next appointment, please call your pharmacy*   Lab Work: NONE ordered at this time of appointment   If you have labs (blood work) drawn today and your tests are completely normal, you will receive your results only by: [place] (if you have MyChart) OR A paper copy in the mail If you have any lab test that is abnormal or we need to change your treatment, we will call you to review the results.   Testing/Procedures: NONE ordered at this time of appointment     Follow-Up: At [facility], you and your health needs are our priority.  As part of our continuing mission to provide you with exceptional heart care, we have created designated Provider Care Teams.  These Care Teams include your primary Cardiologist (physician) and Advanced Practice Providers (APPs -  Physician Assistants and Nurse Practitioners) who all work together to provide you with the care you need, when you need it.  We recommend signing up for the patient portal called "MyChart".  Sign up information is provided on this After Visit Summary.  MyChart is used to connect with patients for Virtual Visits (Telemedicine).  Patients are able to view lab/test results, encounter notes, upcoming appointments, etc.  Non-urgent messages can be sent to your provider as well.   To learn more about what you can do with MyChart, go to [email or web address].    Your next appointment:   3 month(s)  The format for your next appointment:   In Person  Provider:   Evalina Field, MD  or Diona Browner, NP        Other Instructions   Important Information About Sugar

## 2021-11-18 ENCOUNTER — Ambulatory Visit (INDEPENDENT_AMBULATORY_CARE_PROVIDER_SITE_OTHER): Payer: Medicaid Other | Admitting: Podiatry

## 2021-11-18 DIAGNOSIS — E11621 Type 2 diabetes mellitus with foot ulcer: Secondary | ICD-10-CM

## 2021-11-18 DIAGNOSIS — L97422 Non-pressure chronic ulcer of left heel and midfoot with fat layer exposed: Secondary | ICD-10-CM

## 2021-11-18 NOTE — Progress Notes (Signed)
  Subjective:  Patient ID: Micheal Bell, male    DOB: 1971/01/08,  MRN: [id number]  Chief Complaint  Patient presents with   Diabetic Ulcer    3 week follow up left midfoot    51 y.o. male returns with the above complaint. History confirmed with patient.  Has been using the Iodosorb and silver alginate   Objective:  Physical Exam: warm, good capillary refill, normal DP and PT pulses, reduced sensation grossly throughout the left lower extremity, onychomycosis x10 with yellow and elongated thickened nail plates, worst of which is the right hallux Left Foot: Recurrent ulceration submetatarsal 5 measures 0.9x0.9 x 0.3 cm with exposed subcutaneous tissue and today there is no malodor, minimal drainage, no erythema cellulitis or exposed bone or tendon     Assessment:   1. Diabetic ulcer of left midfoot associated with type 2 diabetes mellitus, with fat layer exposed ([place])         Plan:  Patient was evaluated and treated and all questions answered.    Ulcer left foot -We discussed the etiology and factors that are a part of the wound healing process.  We also discussed the risk of infection both soft tissue and osteomyelitis from open ulceration.  Discussed the risk of limb loss if this happens or worsens. -Debridement as below. -Dressed with Iodosorb and silver fiber gel dressing  -Continue home dressing changes with silver alginate and Iodosorb, continues to make quite a bit of improvement with this -Continue off-loading with extra depth diabetic shoes with a custom molded multi density insole.  -Vascular testing not indicated he has palpable pulses -HgbA1c: Last A1c 6.7% in March he is due for another one soon he says -Last antibiotics: No further interval antibiotics indicated  Procedure: Excisional Debridement of Wound Rationale: Removal of non-viable soft tissue from the wound to promote healing.  Anesthesia: none Post-Debridement Wound Measurements: 0.9x0.9 cm x 0.3  cm Type of Debridement: Sharp Excisional Tissue Removed: Non-viable soft tissue Depth of Debridement: subcutaneous tissue. Technique: Sharp excisional debridement to bleeding, viable wound base.  Dressing: Dry, sterile, compression dressing. Disposition: Patient tolerated procedure well.    Return in about 3 weeks (around 12/09/2021) for wound care.

## 2021-11-25 ENCOUNTER — Other Ambulatory Visit: Payer: Self-pay | Admitting: Cardiovascular Disease

## 2021-11-28 NOTE — Telephone Encounter (Signed)
error 

## 2021-12-09 ENCOUNTER — Ambulatory Visit (INDEPENDENT_AMBULATORY_CARE_PROVIDER_SITE_OTHER): Payer: Medicaid Other | Admitting: Podiatry

## 2021-12-09 DIAGNOSIS — L97422 Non-pressure chronic ulcer of left heel and midfoot with fat layer exposed: Secondary | ICD-10-CM

## 2021-12-09 DIAGNOSIS — E11621 Type 2 diabetes mellitus with foot ulcer: Secondary | ICD-10-CM | POA: Diagnosis not present

## 2021-12-09 NOTE — Progress Notes (Signed)
  Subjective:  Patient ID: Micheal Bell, male    DOB: 08-25-1970,  MRN: [id number]  Chief Complaint  Patient presents with   Diabetic Ulcer    3 week follow up left midfoot    51 y.o. male returns with the above complaint. History confirmed with patient.  Has been using the Iodosorb and silver alginate   Objective:  Physical Exam: warm, good capillary refill, normal DP and PT pulses, reduced sensation grossly throughout the left lower extremity, onychomycosis x10 with yellow and elongated thickened nail plates, worst of which is the right hallux Left Foot: Recurrent ulceration submetatarsal 5 measures 1.8x0.0 0.2 cm with exposed subcutaneous tissue and today there is no malodor, minimal drainage, no erythema cellulitis or exposed bone or tendon     Assessment:   1. Diabetic ulcer of left midfoot associated with type 2 diabetes mellitus, with fat layer exposed ([place])          Plan:  Patient was evaluated and treated and all questions answered.    Ulcer left foot -We discussed the etiology and factors that are a part of the wound healing process.  We also discussed the risk of infection both soft tissue and osteomyelitis from open ulceration.  Discussed the risk of limb loss if this happens or worsens. -Debridement as below. -Dressed with Iodosorb and silver fiber gel dressing  -Had slight increase in size.  Iodosorb and alginate appear to be controlling biofilm well.  Offloading is difficult -Continue off-loading with extra depth diabetic shoes with a custom molded multi density insole.  -Vascular testing not indicated he has palpable pulses -HgbA1c: Last A1c 6.7% in March he is due for another one soon he says -Last antibiotics: No further interval antibiotics indicated  Procedure: Excisional Debridement of Wound Rationale: Removal of non-viable soft tissue from the wound to promote healing.  Anesthesia: none Post-Debridement Wound Measurements: 1.8 x 0.8 x 0.2  cm Type of Debridement: Sharp Excisional Tissue Removed: Non-viable soft tissue Depth of Debridement: subcutaneous tissue. Technique: Sharp excisional debridement to bleeding, viable wound base.  Dressing: Dry, sterile, compression dressing. Disposition: Patient tolerated procedure well.    Return in about 3 weeks (around 12/30/2021) for wound care.

## 2021-12-30 ENCOUNTER — Ambulatory Visit (INDEPENDENT_AMBULATORY_CARE_PROVIDER_SITE_OTHER): Payer: Medicaid Other | Admitting: Podiatry

## 2021-12-30 DIAGNOSIS — E11621 Type 2 diabetes mellitus with foot ulcer: Secondary | ICD-10-CM | POA: Diagnosis not present

## 2021-12-30 DIAGNOSIS — L97422 Non-pressure chronic ulcer of left heel and midfoot with fat layer exposed: Secondary | ICD-10-CM | POA: Diagnosis not present

## 2021-12-31 NOTE — Progress Notes (Signed)
  Subjective:  Patient ID: Micheal Bell, male    DOB: 1970/11/29,  MRN: [id number]  Chief Complaint  Patient presents with   Diabetic Ulcer    3 week follow up left foot    51 y.o. male returns with the above complaint. History confirmed with patient.  Has been using the Iodosorb and silver alginate   Objective:  Physical Exam: warm, good capillary refill, normal DP and PT pulses, reduced sensation grossly throughout the left lower extremity, onychomycosis x10 with yellow and elongated thickened nail plates, worst of which is the right hallux Left Foot: Recurrent ulceration submetatarsal 5 measures 1.5 x 1.5 0.2 cm with exposed subcutaneous tissue and today there is no malodor, minimal drainage, no erythema cellulitis or exposed bone or tendon     Assessment:   No diagnosis found.        Plan:  Patient was evaluated and treated and all questions answered.    Ulcer left foot -We discussed the etiology and factors that are a part of the wound healing process.  We also discussed the risk of infection both soft tissue and osteomyelitis from open ulceration.  Discussed the risk of limb loss if this happens or worsens. -Debridement as below. -Dressed with Iodosorb and silver fiber gel dressing  -Appearance has improved.  Iodosorb is controlled biofilm.  I think we should transition back to collagen dressings change every other day with silver alginate and gauze dressings. -Continue off-loading with extra depth diabetic shoes with a custom molded multi density insole.  -Vascular testing not indicated he has palpable pulses -HgbA1c: Last A1c 6.7% in March he is due for another one soon he says -Last antibiotics: No further interval antibiotics indicated  Procedure: Excisional Debridement of Wound Rationale: Removal of non-viable soft tissue from the wound to promote healing.  Anesthesia: none Post-Debridement Wound Measurements: 1.5x1.5 x 0.2 cm Type of Debridement: Sharp  Excisional Tissue Removed: Non-viable soft tissue Depth of Debridement: subcutaneous tissue. Technique: Sharp excisional debridement to bleeding, viable wound base.  Dressing: Dry, sterile, compression dressing. Disposition: Patient tolerated procedure well.    Return in about 2 weeks (around 01/13/2022) for wound care.

## 2022-01-13 ENCOUNTER — Ambulatory Visit (INDEPENDENT_AMBULATORY_CARE_PROVIDER_SITE_OTHER): Payer: Medicaid Other | Admitting: Podiatry

## 2022-01-13 DIAGNOSIS — E11621 Type 2 diabetes mellitus with foot ulcer: Secondary | ICD-10-CM | POA: Diagnosis not present

## 2022-01-13 DIAGNOSIS — L97422 Non-pressure chronic ulcer of left heel and midfoot with fat layer exposed: Secondary | ICD-10-CM | POA: Diagnosis not present

## 2022-01-13 NOTE — Progress Notes (Signed)
  Subjective:  Patient ID: Micheal Bell, male    DOB: May 11, 1970,  MRN: [id number]  Chief Complaint  Patient presents with   Diabetic Ulcer    2 week follow up left midfoot    51 y.o. male returns with the above complaint. History confirmed with patient.  Has been using the collagen and silver alginate   Objective:  Physical Exam: warm, good capillary refill, normal DP and PT pulses, reduced sensation grossly throughout the left lower extremity, onychomycosis x10 with yellow and elongated thickened nail plates, worst of which is the right hallux Left Foot: Recurrent ulceration submetatarsal 5 measures one-point 0.3 0.2 cm with exposed subcutaneous tissue and today there is no malodor, minimal drainage, no erythema cellulitis or exposed bone or tendon     Assessment:   1. Diabetic ulcer of left midfoot associated with type 2 diabetes mellitus, with fat layer exposed (HCC)           Plan:  Patient was evaluated and treated and all questions answered.    Ulcer left foot -We discussed the etiology and factors that are a part of the wound healing process.  We also discussed the risk of infection both soft tissue and osteomyelitis from open ulceration.  Discussed the risk of limb loss if this happens or worsens. -Debridement as below. -Dressed with Prisma and silver fiber gel dressing  -Appearance has improved.  Continue use of collagen dressings change every other day with silver alginate and gauze dressings. -Continue off-loading with extra depth diabetic shoes with a custom molded multi density insole.  -Vascular testing not indicated he has palpable pulses -HgbA1c: Last A1c 6.7% in March he is due for another one soon he says -Last antibiotics: No further interval antibiotics indicated  Procedure: Excisional Debridement of Wound Rationale: Removal of non-viable soft tissue from the wound to promote healing.  Anesthesia: none Post-Debridement Wound Measurements: 1.8 x  1.3 x 0.3 cm Type of Debridement: Sharp Excisional Tissue Removed: Non-viable soft tissue Depth of Debridement: subcutaneous tissue. Technique: Sharp excisional debridement to bleeding, viable wound base.  Dressing: Dry, sterile, compression dressing. Disposition: Patient tolerated procedure well.    No follow-ups on file.

## 2022-01-27 ENCOUNTER — Ambulatory Visit (INDEPENDENT_AMBULATORY_CARE_PROVIDER_SITE_OTHER): Payer: Medicaid Other | Admitting: Podiatry

## 2022-01-27 ENCOUNTER — Encounter: Payer: Self-pay | Admitting: Podiatry

## 2022-01-27 DIAGNOSIS — L97422 Non-pressure chronic ulcer of left heel and midfoot with fat layer exposed: Secondary | ICD-10-CM | POA: Diagnosis not present

## 2022-01-27 DIAGNOSIS — E11621 Type 2 diabetes mellitus with foot ulcer: Secondary | ICD-10-CM

## 2022-01-27 NOTE — Progress Notes (Signed)
  Subjective:  Patient ID: Micheal Bell, male    DOB: 09/17/1970,  MRN: [id number]  Chief Complaint  Patient presents with   Diabetic Ulcer    3 week follow up left foot    51 y.o. male returns with the above complaint. History confirmed with patient.  Has been using the Iodosorb and silver alginate   Objective:  Physical Exam: warm, good capillary refill, normal DP and PT pulses, reduced sensation grossly throughout the left lower extremity, onychomycosis x10 with yellow and elongated thickened nail plates, worst of which is the right hallux Left Foot: Recurrent ulceration submetatarsal 5 measures 1.7 x 1.7 x 0.3 with exposed subcutaneous tissue and today there is no malodor, minimal drainage, no erythema cellulitis or exposed bone or tendon     Assessment:   1. Diabetic ulcer of left midfoot associated with type 2 diabetes mellitus, with fat layer exposed (HCC)           Plan:  Patient was evaluated and treated and all questions answered.    Ulcer left foot -We discussed the etiology and factors that are a part of the wound healing process.  We also discussed the risk of infection both soft tissue and osteomyelitis from open ulceration.  Discussed the risk of limb loss if this happens or worsens. -Debridement as below. -Dressed with Prisma and silver fiber gel dressing  -Continue off-loading with extra depth diabetic shoes with a custom molded multi density insole.  -Vascular testing not indicated he has palpable pulses -HgbA1c: Last A1c 6.7% in March he is due for another one soon he says -Last antibiotics: No further interval antibiotics indicated -At this point his wound does appear to have stagnated, has not much pain dimensional progress since late September.  I think we should increase his offloading to a total contact cast we discussed the rationale with him regarding this today.  We will plan to do this starting next week.  Procedure: Excisional Debridement of  Wound Rationale: Removal of non-viable soft tissue from the wound to promote healing.  Anesthesia: none Post-Debridement Wound Measurements: 1.7x1.7 x 0.3 cm Type of Debridement: Sharp Excisional Tissue Removed: Non-viable soft tissue Depth of Debridement: subcutaneous tissue. Technique: Sharp excisional debridement to bleeding, viable wound base.  Dressing: Dry, sterile, compression dressing. Disposition: Patient tolerated procedure well.    Return in about 9 days (around 02/05/2022) for total contact casting, wound care.

## 2022-02-10 ENCOUNTER — Ambulatory Visit (INDEPENDENT_AMBULATORY_CARE_PROVIDER_SITE_OTHER): Payer: Medicaid Other | Admitting: Podiatry

## 2022-02-10 DIAGNOSIS — E11621 Type 2 diabetes mellitus with foot ulcer: Secondary | ICD-10-CM | POA: Diagnosis not present

## 2022-02-10 DIAGNOSIS — L97422 Non-pressure chronic ulcer of left heel and midfoot with fat layer exposed: Secondary | ICD-10-CM

## 2022-02-10 NOTE — Progress Notes (Signed)
  Subjective:  Patient ID: Micheal Bell, male    DOB: 30-Mar-1970,  MRN: [id number]  Chief Complaint  Patient presents with   Diabetic Ulcer    total contact casting, wound care    51 y.o. male returns with the above complaint. History confirmed with patient.  Has been using the Iodosorb and silver alginate   Objective:  Physical Exam: warm, good capillary refill, normal DP and PT pulses, reduced sensation grossly throughout the left lower extremity, onychomycosis x10 with yellow and elongated thickened nail plates, worst of which is the right hallux Left Foot: Recurrent ulceration submetatarsal 5 measures 1.5 x 1.5 x 0.3 with exposed subcutaneous tissue and today there is no malodor, minimal drainage, no erythema cellulitis or exposed bone or tendon     Assessment:   1. Diabetic ulcer of left midfoot associated with type 2 diabetes mellitus, with fat layer exposed (HCC)           Plan:  Patient was evaluated and treated and all questions answered.    Ulcer left foot -We discussed the etiology and factors that are a part of the wound healing process.  We also discussed the risk of infection both soft tissue and osteomyelitis from open ulceration.  Discussed the risk of limb loss if this happens or worsens. -Debridement as below. -Dressed with Prisma and silver fiber gel dressing  -Continue off-loading with extra depth diabetic shoes with a custom molded multi density insole.  -Vascular testing not indicated he has palpable pulses -HgbA1c: Last A1c 6.7% in March he is due for another one soon he says -Last antibiotics: No further interval antibiotics indicated -Following debridement the wound was dressed with Hydrofera Blue, ABD pad and a well-padded below the knee total contact rigid fiberglass cast and a cast boot was applied.  He will follow-up with [place] in 1 week for further wound care and cast changes  Procedure: Excisional Debridement of Wound Rationale: Removal of  non-viable soft tissue from the wound to promote healing.  Anesthesia: none Post-Debridement Wound Measurements: 1.5x1.5 x 0.3 cm Type of Debridement: Sharp Excisional Tissue Removed: Non-viable soft tissue Depth of Debridement: subcutaneous tissue. Technique: Sharp excisional debridement to bleeding, viable wound base.  Dressing: Dry, sterile, compression dressing. Disposition: Patient tolerated procedure well.    Return in about 1 week (around 02/17/2022) for total contact casting, wound care.

## 2022-02-13 ENCOUNTER — Encounter: Payer: Self-pay | Admitting: Nurse Practitioner

## 2022-02-13 ENCOUNTER — Ambulatory Visit: Payer: Medicaid Other | Attending: Nurse Practitioner | Admitting: Nurse Practitioner

## 2022-02-13 ENCOUNTER — Other Ambulatory Visit: Payer: Self-pay

## 2022-02-13 VITALS — BP 128/76 | Ht 74.0 in | Wt 282.8 lb

## 2022-02-13 DIAGNOSIS — I25118 Atherosclerotic heart disease of native coronary artery with other forms of angina pectoris: Secondary | ICD-10-CM | POA: Diagnosis not present

## 2022-02-13 DIAGNOSIS — I10 Essential (primary) hypertension: Secondary | ICD-10-CM | POA: Diagnosis not present

## 2022-02-13 DIAGNOSIS — E119 Type 2 diabetes mellitus without complications: Secondary | ICD-10-CM

## 2022-02-13 DIAGNOSIS — E785 Hyperlipidemia, unspecified: Secondary | ICD-10-CM

## 2022-02-13 DIAGNOSIS — I5022 Chronic systolic (congestive) heart failure: Secondary | ICD-10-CM

## 2022-02-13 DIAGNOSIS — Z8673 Personal history of transient ischemic attack (TIA), and cerebral infarction without residual deficits: Secondary | ICD-10-CM

## 2022-02-13 LAB — COMPREHENSIVE METABOLIC PANEL
ALT: 17 IU/L (ref 0–44)
AST: 22 IU/L (ref 0–40)
Albumin/Globulin Ratio: 2.4 — ABNORMAL HIGH (ref 1.2–2.2)
Albumin: 4.7 g/dL (ref 3.8–4.9)
Alkaline Phosphatase: 128 IU/L — ABNORMAL HIGH (ref 44–121)
BUN/Creatinine Ratio: 18 (ref 9–20)
BUN: 17 mg/dL (ref 6–24)
Bilirubin Total: 0.4 mg/dL (ref 0.0–1.2)
CO2: 24 mmol/L (ref 20–29)
Calcium: 9.2 mg/dL (ref 8.7–10.2)
Chloride: 103 mmol/L (ref 96–106)
Creatinine, Ser: 0.95 mg/dL (ref 0.76–1.27)
Globulin, Total: 2 g/dL (ref 1.5–4.5)
Glucose: 166 mg/dL — ABNORMAL HIGH (ref 70–99)
Potassium: 4.6 mmol/L (ref 3.5–5.2)
Sodium: 141 mmol/L (ref 134–144)
Total Protein: 6.7 g/dL (ref 6.0–8.5)
eGFR: 96 mL/min/{1.73_m2} (ref >59)

## 2022-02-13 LAB — LIPID PANEL
Chol/HDL Ratio: 2.1 ratio (ref 0.0–5.0)
Cholesterol, Total: 119 mg/dL (ref 100–199)
HDL: 58 mg/dL (ref >39)
LDL Chol Calc (NIH): 42 mg/dL (ref 0–99)
Triglycerides: 107 mg/dL (ref 0–149)
VLDL Cholesterol Cal: 19 mg/dL (ref 5–40)

## 2022-02-13 MED ORDER — FUROSEMIDE 20 MG PO TABS: ORAL_TABLET | ORAL | 3 refills | Status: AC

## 2022-02-13 MED ORDER — SPIRONOLACTONE 25 MG PO TABS: 25.0000 mg | ORAL_TABLET | Freq: Every day | ORAL | 3 refills | Status: DC

## 2022-02-13 NOTE — Patient Instructions (Addendum)
Medication Instructions:  Lasix 20 mg daily. May take an additional 20 mg on days of increased swelling and weight gain of 3 lb overnight or 5 lb in 1 week.   Spironolactone 25 mg daily   *If you need a refill on your cardiac medications before your next appointment, please call your pharmacy*   Lab Work: Your physician recommends that you complete lab work today and return in 2 weeks for blood work.  Fasting lipid panel & CMET (Today) BMET (2 weeks)  If you have labs (blood work) drawn today and your tests are completely normal, you will receive your results only by: MyChart Message (if you have MyChart) OR A paper copy in the mail If you have any lab test that is abnormal or we need to change your treatment, we will call you to review the results.   Testing/Procedures: NONE ordered at this time of appointment     Follow-Up: At [facility], you and your health needs are our priority.  As part of our continuing mission to provide you with exceptional heart care, we have created designated Provider Care Teams.  These Care Teams include your primary Cardiologist (physician) and Advanced Practice Providers (APPs -  Physician Assistants and Nurse Practitioners) who all work together to provide you with the care you need, when you need it.  We recommend signing up for the patient portal called "MyChart".  Sign up information is provided on this After Visit Summary.  MyChart is used to connect with patients for Virtual Visits (Telemedicine).  Patients are able to view lab/test results, encounter notes, upcoming appointments, etc.  Non-urgent messages can be sent to your provider as well.   To learn more about what you can do with MyChart, go to [email or web address].    Your next appointment:   4 month(s)  The format for your next appointment:   In Person  Provider:   [name], MD     Other Instructions   Important Information About Sugar

## 2022-02-13 NOTE — Progress Notes (Signed)
Office Visit    Patient Name: Micheal Bell Date of Encounter: 02/13/2022  Primary Care Provider:  [name], MD Primary Cardiologist:  [name], MD  Chief Complaint    51 year old male with a history of CAD, chronic systolic heart failure with improved EF, hypertension, hyperlipidemia, type 2 diabetes, DVT, CVA, IBS, fibromyalgia, anxiety, depression, and gout who presents for follow-up related to CAD.   Past Medical History    Past Medical History:  Diagnosis Date   Anxiety    Arthritis    Asthma    inhaler daily   CHF (congestive heart failure) (HCC)    Coronary artery disease    Depression    Diabetes mellitus without complication (HCC)    DVT (deep venous thrombosis) (HCC)    Dyspnea    Fibromyalgia    Gout    History of kidney stones    Hypertension    IBS (irritable bowel syndrome)    Myocardial infarction (HCC) 09/2019   Pneumonia 09/2019   Stroke (HCC) 09/2019   Vein disorder    Past Surgical History:  Procedure Laterality Date   CORONARY ATHERECTOMY N/A 02/20/2020   Procedure: CORONARY ATHERECTOMY;  Surgeon: [name], [name], MD;  Location: [facility] INVASIVE CV LAB;  Service: Cardiovascular;  Laterality: N/A;  CSI LAD   CORONARY STENT INTERVENTION N/A 02/20/2020   Procedure: CORONARY STENT INTERVENTION;  Surgeon: [name], [name], MD;  Location: [facility] INVASIVE CV LAB;  Service: Cardiovascular;  Laterality: N/A;  LAD CFX   FOOT SURGERY     INTRAVASCULAR ULTRASOUND/IVUS N/A 02/20/2020   Procedure: Intravascular Ultrasound/IVUS;  Surgeon: [name], [name], MD;  Location: [facility] INVASIVE CV LAB;  Service: Cardiovascular;  Laterality: N/A;   LEFT HEART CATH AND CORONARY ANGIOGRAPHY N/A 02/12/2020   Procedure: LEFT HEART CATH AND CORONARY ANGIOGRAPHY;  Surgeon: [name], MD;  Location: [facility] INVASIVE CV LAB;  Service: Cardiovascular;  Laterality: N/A;   LEFT HEART CATH AND CORONARY ANGIOGRAPHY N/A 11/05/2021   Procedure: LEFT HEART CATH AND CORONARY  ANGIOGRAPHY;  Surgeon: Marykay Lex, MD;  Location: [facility] INVASIVE CV LAB;  Service: Cardiovascular;  Laterality: N/A;   REVERSE SHOULDER ARTHROPLASTY Right 10/17/2020   Procedure: REVERSE SHOULDER ARTHROPLASTY;  Surgeon: Jones Broom, MD;  Location: [facility] ORS;  Service: Orthopedics;  Laterality: Right;   TEE WITHOUT CARDIOVERSION N/A 10/24/2019   Procedure: TRANSESOPHAGEAL ECHOCARDIOGRAM (TEE);  Surgeon: [name], MD;  Location: [facility] ENDOSCOPY;  Service: Cardiovascular;  Laterality: N/A;    Allergies  Allergies  Allergen Reactions   Azithromycin     Blisters in mouth    Lodine [Etodolac] Anaphylaxis    History of Present Illness    51 year old male with the above past medical history including CAD, chronic systolic heart failure with improved EF, hypertension, hyperlipidemia, type 2 diabetes, DVT, CVA, IBS, fibromyalgia, anxiety, depression, and gout.   He was hospitalized in August 2021 with sepsis.  Echocardiogram at the time showed EF 30 to 35% moderately decreased LV function.  Nuclear study in November 2021, was abnormal, EF 34%.  Follow-up cardiac catheterization revealed severe multivessel disease, CABG was deferred due to overall physical deconditioning.  He underwent successful complex PCI of the proximal to distal LAD with orbital atherectomy and DES x3 as well as successful PCI of the LCx/OM1 with a long DESx1.  He was noted to have residual PDA lesion however, this was a small caliber vessel, medical management was advised. Echocardiogram in 07/2020 showed EF 55 to 60%, normal  LV function, no RWMA, G1 DD, no significant valvular abnormalities.  At his follow-up visit in 10/2021 and reported progressive chest pain.  He was referred for cardiac catheterization which revealed patent stents to LAD, patent stent to LCx, 40% pRCA stenosis, and 75% RPDA lesion, stable from prior cath, EF 55-60%.  Ongoing medical therapy was advised.  He was last seen in the office on 11/13/2021 and was  stable from a cardiac standpoint.  He reported 1 episode of chest discomfort relieved with nitroglycerin.  He was restarted on Imdur.   He presents today for follow-up accompanied by his uncle.  Since his last visit has been stable from a cardiac standpoint.  His chest pain has significantly improved with the addition of Imdur.  He does note some intermittent bilateral lower extremity edema despite increasing his Lasix to 20 mg daily.  He denies any dyspnea, PND, orthopnea, weight gain.  Overall, he reports feeling well.  Home Medications    Current Outpatient Medications  Medication Sig Dispense Refill   ACCU-CHEK GUIDE test strip Use to check blood glucose twice daily     Accu-Chek Softclix Lancets lancets 2 (two) times daily.     acetaminophen (TYLENOL) 500 MG tablet Take 1,000 mg by mouth every 6 (six) hours as needed for moderate pain.     albuterol (PROVENTIL HFA;VENTOLIN HFA) 108 (90 Base) MCG/ACT inhaler Inhale 1-2 puffs into the lungs every 4 (four) hours as needed for wheezing or shortness of breath.     albuterol (PROVENTIL) (2.5 MG/3ML) 0.083% nebulizer solution Take 2.5 mg by nebulization 3 (three) times daily as needed for wheezing or shortness of breath.     allopurinol (ZYLOPRIM) 300 MG tablet Take 300 mg by mouth daily.     aspirin EC 81 MG tablet Take 1 tablet (81 mg total) by mouth daily. Swallow whole. 30 tablet 11   buPROPion (WELLBUTRIN XL) 150 MG 24 hr tablet Take 150 mg by mouth every morning.     carvedilol (COREG) 12.5 MG tablet TAKE ONE TABLET BY MOUTH TWICE DAILY WITH A MEAL 180 tablet 1   ciclopirox (PENLAC) 8 % solution Apply topically at bedtime. Apply over nail and surrounding skin. Apply daily over previous coat. After seven (7) days, may remove with alcohol and continue cycle. (Patient taking differently: Apply 1 Application topically at bedtime as needed (nail fungus). Apply over nail and surrounding skin. Apply daily over previous coat. After seven (7) days, may  remove with alcohol and continue cycle.) 6.6 mL 2   cyclobenzaprine (FLEXERIL) 10 MG tablet Take 0.5 tablets (5 mg total) by mouth 2 (two) times daily as needed for muscle spasms. (Patient taking differently: Take 5 mg by mouth 3 (three) times daily as needed for muscle spasms.) 15 tablet 0   empagliflozin (JARDIANCE) 10 MG TABS tablet Take 1 tablet (10 mg total) by mouth daily before breakfast. 90 tablet 3   fluticasone (FLONASE) 50 MCG/ACT nasal spray Place 1 spray into both nostrils daily as needed for allergies.     gabapentin (NEURONTIN) 300 MG capsule Take 1,200 mg by mouth 2 (two) times daily.     HYDROmorphone (DILAUDID) 4 MG tablet Take 1 tablet (4 mg total) by mouth in the morning, at noon, and at bedtime. 6 tablet 0   insulin glargine (LANTUS) 100 UNIT/ML Solostar Pen Inject 20 Units into the skin daily. (Patient taking differently: Inject 30 Units into the skin at bedtime.) 15 mL 0   isosorbide mononitrate (IMDUR) 30 MG 24  hr tablet Take 1 tablet (30 mg total) by mouth daily. 90 tablet 3   lidocaine (XYLOCAINE) 5 % ointment APPLY TO AFFECTED AREA TOPICALLY AS NEEDED (Patient taking differently: Apply 1 Application topically at bedtime.) 50 g 3   meloxicam (MOBIC) 15 MG tablet Take 15 mg by mouth daily.     Misc. Devices (RECONSTITUBE) MISC Nebulizer, tubing  J45.30  Fax to [employer]     mometasone-formoterol (DULERA) 200-5 MCG/ACT AERO Inhale 1 puff into the lungs 2 (two) times daily.     montelukast (SINGULAIR) 10 MG tablet Take 10 mg by mouth at bedtime.     Multiple Vitamins-Minerals (MULTIVITAMIN WITH MINERALS) tablet Take 1 tablet by mouth daily.     Nebulizers (COMP AIR COMPRESSOR NEBULIZER) MISC USE AS DIRECTED INCLUDE TUBING J45.30     nitroGLYCERIN (NITROSTAT) 0.4 MG SL tablet Place 1 tablet (0.4 mg total) under the tongue every 5 (five) minutes as needed for chest pain. 25 tablet 2   nortriptyline (PAMELOR) 25 MG capsule Take 25 mg by mouth at bedtime.     pantoprazole  (PROTONIX) 40 MG tablet Take 40 mg by mouth daily.     PENTIPS 32G X 4 MM MISC See admin instructions. with insulin     potassium chloride SA (KLOR-CON M) 20 MEQ tablet TAKE ONE TABLET BY MOUTH AS NEEDED WITH lasix 90 tablet 1   Povidone-Iodine (BETADINE EX) Apply 1 Application topically daily.     RESTASIS 0.05 % ophthalmic emulsion Place 1 drop into both eyes 2 (two) times daily as needed (dry eyes).     rosuvastatin (CRESTOR) 20 MG tablet Take 1 tablet (20 mg total) by mouth daily. 90 tablet 0   sacubitril-valsartan (ENTRESTO) 24-26 MG Take 1 tablet by mouth 2 (two) times daily. 180 tablet 3   spironolactone (ALDACTONE) 25 MG tablet Take 1 tablet (25 mg total) by mouth daily. 90 tablet 3   triamcinolone (KENALOG) 0.1 % Apply 1 application topically 2 (two) times daily as needed (irritation).     zolpidem (AMBIEN) 10 MG tablet Take 10 mg by mouth at bedtime.     furosemide (LASIX) 20 MG tablet Lasix 20 mg daily. May take an additional 20 mg on days of increased swelling and weight gain of 3 lb overnight or 5 lb in 1 week. 180 tablet 3   Current Facility-Administered Medications  Medication Dose Route Frequency Provider Last Rate Last Admin   sodium chloride flush (NS) 0.9 % injection 3 mL  3 mL Intravenous Q12H Sharlene Dory, NP       Facility-Administered Medications Ordered in Other Visits  Medication Dose Route Frequency Provider Last Rate Last Admin   regadenoson (LEXISCAN) injection SOLN 0.4 mg  0.4 mg Intravenous Once O'Neal, Ronnald Ramp, MD         Review of Systems    He denies chest pain, palpitations, dyspnea, pnd, orthopnea, n, v, dizziness, syncope, weight gain, or early satiety. All other systems reviewed and are otherwise negative except as noted above.   Physical Exam    VS:  BP 128/76   Ht 6\' 2"  (1.88 m)   Wt 282 lb 12.8 oz (128.3 kg)   SpO2 97%   BMI 36.31 kg/m  GEN: Well nourished, well developed, in no acute distress. HEENT: normal. Neck: Supple, no JVD,  carotid bruits, or masses. Cardiac: RRR, no murmurs, rubs, or gallops. No clubbing, cyanosis, edema.  Radials/DP/PT 2+ and equal bilaterally.  Respiratory:  Respirations regular and unlabored, clear to  auscultation bilaterally. GI: Soft, nontender, nondistended, BS + x 4. MS: no deformity or atrophy. Skin: warm and dry, no rash. Neuro:  Strength and sensation are intact. Psych: Normal affect.  Accessory Clinical Findings    ECG personally reviewed by me today -NSR, 75 bpm- no acute changes.   Lab Results  Component Value Date   WBC 7.8 10/31/2021   HGB 13.2 10/31/2021   HCT 39.5 10/31/2021   MCV 93 10/31/2021   PLT 151 10/31/2021   Lab Results  Component Value Date   CREATININE 1.11 10/31/2021   BUN 23 10/31/2021   NA 137 10/31/2021   K 4.7 10/31/2021   CL 96 10/31/2021   CO2 25 10/31/2021   Lab Results  Component Value Date   ALT 18 10/17/2020   AST 22 10/17/2020   ALKPHOS 116 10/17/2020   BILITOT 0.4 10/17/2020   Lab Results  Component Value Date   CHOL 120 05/15/2020   HDL 50 05/15/2020   LDLCALC 47 05/15/2020   TRIG 131 05/15/2020   CHOLHDL 2.4 05/15/2020    Lab Results  Component Value Date   HGBA1C 11.3 (H) 10/26/2019    Assessment & Plan    1. CAD: S/p successful complex PCI of the proximal to distal LAD with orbital atherectomy and DES x3 as well as successful PCI of the LCx/OM1 with a long DESx1 in 2021.  Repeat cath on 10/28/2021 revealed patent stents to LAD, patent stent to LCx, 40% pRCA stenosis, and 75% RPDA lesion, stable from prior cath, EF 55-60%.  Ongoing medical therapy was advised.  At his last visit he reported intermittent chest discomfort relieved with nitroglycerin. Since restarting Imdur he has not had any further episodes of chest discomfort. Continue aspirin, carvedilol, Entresto, Jardiance, Lasix as below, spironolactone as below, and Crestor.   2. Systolic heart failure with improved EF: Prior EF 30 to 35%.  Most recent echo in 07/2020  showed EF 55 to 60%, normal LV function, no RWMA, G1 DD, no significant valvular abnormalities.  He does note increased bilateral lower extremity edema.  He is taking Lasix 20 mg daily though it is prescribed as 20 mg daily as needed.  Euvolemic and well compensated on exam.  Will increase Lasix to 20 mg daily.  He may take an additional Lasix 20 mg daily as needed for weight gain, swelling, for a total of 40 mg daily as needed.  Will increase spironolactone to 25 mg daily.  Will check CMET today.  Plan for repeat BMET in 2 weeks.  Otherwise, continue current medications as above.   3. Hypertension: BP well controlled. Continue current antihypertensive regimen with above changes.    4. Hyperlipidemia: LDL was 47 in 04/2020.  Repeat FLP, CMET today.  Continue aspirin, Crestor.  5. Type 2 diabetes: No recent A1c on file.  Monitored and managed per PCP.   6. H/o CVA: No recurrence.  Continue ASA, Crestor.    7. Disposition: Follow-up in 05/2022 with Dr. Flora Lipps.      Joylene Grapes, NP 02/13/2022, 11:21 AM

## 2022-02-19 ENCOUNTER — Ambulatory Visit (INDEPENDENT_AMBULATORY_CARE_PROVIDER_SITE_OTHER): Payer: Medicaid Other | Admitting: Podiatry

## 2022-02-19 DIAGNOSIS — E11621 Type 2 diabetes mellitus with foot ulcer: Secondary | ICD-10-CM | POA: Diagnosis not present

## 2022-02-19 DIAGNOSIS — L97422 Non-pressure chronic ulcer of left heel and midfoot with fat layer exposed: Secondary | ICD-10-CM

## 2022-02-19 NOTE — Progress Notes (Signed)
  Subjective:  Patient ID: Micheal Bell, male    DOB: 16-Sep-1970,  MRN: [id number]  Chief Complaint  Patient presents with   Diabetic Ulcer    Total contact change. Per Zaiyden Strozier    51 y.o. male returns with the above complaint. History confirmed with patient. Had some rubbing of the cast   Objective:  Physical Exam: warm, good capillary refill, normal DP and PT pulses, reduced sensation grossly throughout the left lower extremity, onychomycosis x10 with yellow and elongated thickened nail plates, worst of which is the right hallux Left Foot: Recurrent ulceration submetatarsal 5 measures 1.4 x 1.1 x 0.3 with exposed subcutaneous tissue and today there is no malodor, minimal drainage, no erythema cellulitis or exposed bone or tendon. Small skin tear anterior mid leg     Assessment:   1. Diabetic ulcer of left midfoot associated with type 2 diabetes mellitus, with fat layer exposed (HCC)           Plan:  Patient was evaluated and treated and all questions answered.    Ulcer left foot -We discussed the etiology and factors that are a part of the wound healing process.  We also discussed the risk of infection both soft tissue and osteomyelitis from open ulceration.  Discussed the risk of limb loss if this happens or worsens. -Debridement as below. -Dressed with Prisma and silver fiber gel dressing  -Continue off-loading with extra depth diabetic shoes with a custom molded multi density insole.  -Vascular testing not indicated he has palpable pulses -HgbA1c: Last A1c 6.7% in March he is due for another one soon he says -Last antibiotics: No further interval antibiotics indicated -Following debridement the wound was dressed with solosite, Hydrofera Blue, ABD pad and a well-padded below the knee total contact rigid fiberglass cast and a cast boot was applied.  He will follow-up with [place] in 1 week for further wound care and cast changes  Procedure: Excisional Debridement of  Wound Rationale: Removal of non-viable soft tissue from the wound to promote healing.  Anesthesia: none Post-Debridement Wound Measurements: 1.4 x 1.1 x 0.3 cm Type of Debridement: Sharp Excisional Tissue Removed: Non-viable soft tissue Depth of Debridement: subcutaneous tissue. Technique: Sharp excisional debridement to bleeding, viable wound base.  Dressing: Dry, sterile, compression dressing. Disposition: Patient tolerated procedure well.    No follow-ups on file.

## 2022-02-26 ENCOUNTER — Ambulatory Visit: Payer: Medicaid Other | Admitting: Podiatry

## 2022-02-26 ENCOUNTER — Ambulatory Visit (INDEPENDENT_AMBULATORY_CARE_PROVIDER_SITE_OTHER): Payer: Medicaid Other | Admitting: Podiatry

## 2022-02-26 DIAGNOSIS — L97422 Non-pressure chronic ulcer of left heel and midfoot with fat layer exposed: Secondary | ICD-10-CM

## 2022-02-26 DIAGNOSIS — M79675 Pain in left toe(s): Secondary | ICD-10-CM

## 2022-02-26 DIAGNOSIS — E11621 Type 2 diabetes mellitus with foot ulcer: Secondary | ICD-10-CM

## 2022-02-26 DIAGNOSIS — M79674 Pain in right toe(s): Secondary | ICD-10-CM | POA: Diagnosis not present

## 2022-02-26 DIAGNOSIS — B351 Tinea unguium: Secondary | ICD-10-CM

## 2022-02-27 ENCOUNTER — Telehealth: Payer: Self-pay

## 2022-02-27 LAB — BASIC METABOLIC PANEL
BUN/Creatinine Ratio: 21 — ABNORMAL HIGH (ref 9–20)
BUN: 22 mg/dL (ref 6–24)
CO2: 26 mmol/L (ref 20–29)
Calcium: 9.5 mg/dL (ref 8.7–10.2)
Chloride: 98 mmol/L (ref 96–106)
Creatinine, Ser: 1.05 mg/dL (ref 0.76–1.27)
Glucose: 145 mg/dL — ABNORMAL HIGH (ref 70–99)
Potassium: 4.8 mmol/L (ref 3.5–5.2)
Sodium: 136 mmol/L (ref 134–144)
eGFR: 85 mL/min/{1.73_m2} (ref >59)

## 2022-02-27 NOTE — Telephone Encounter (Signed)
Lmom, waiting on a return call to discuss lab results.  

## 2022-03-02 NOTE — Progress Notes (Signed)
  Subjective:  Patient ID: Micheal Bell, male    DOB: 1970/07/27,  MRN: [id number]  No chief complaint on file.   52 y.o. male returns with the above complaint. History confirmed with patient.  He is doing well his nails are thickened elongated causing discomfort in the cast   Objective:  Physical Exam: warm, good capillary refill, normal DP and PT pulses, reduced sensation grossly throughout the left lower extremity, onychomycosis x10 with yellow and elongated thickened nail plates, worst of which is the right hallux Left Foot: Recurrent ulceration submetatarsal 5 measures 1.0 x 0.7 x 0.3 with exposed subcutaneous tissue and today there is no malodor, minimal drainage, no erythema cellulitis or exposed bone or tendon. Small skin tear anterior mid leg    Assessment:   1. Diabetic ulcer of left midfoot associated with type 2 diabetes mellitus, with fat layer exposed ([place])   2. Pain due to onychomycosis of toenails of both feet           Plan:  Patient was evaluated and treated and all questions answered.    Ulcer left foot -We discussed the etiology and factors that are a part of the wound healing process.  We also discussed the risk of infection both soft tissue and osteomyelitis from open ulceration.  Discussed the risk of limb loss if this happens or worsens. -Debridement as below. -Dressed with Prisma and silver fiber gel dressing  -Continue off-loading with extra depth diabetic shoes with a custom molded multi density insole.  -Vascular testing not indicated he has palpable pulses -HgbA1c: Last A1c 6.7% in March he is due for another one soon he says -Last antibiotics: No further interval antibiotics indicated -Following debridement the wound was dressed with solosite, Hydrofera Blue, ABD pad and a well-padded below the knee total contact rigid fiberglass cast and a cast boot was applied.  He will follow-up with [place] in 1 week for further wound care and cast  changes  Procedure: Excisional Debridement of Wound Rationale: Removal of non-viable soft tissue from the wound to promote healing.  Anesthesia: none Post-Debridement Wound Measurements: Noted above Type of Debridement: Sharp Excisional Tissue Removed: Non-viable soft tissue Depth of Debridement: subcutaneous tissue. Technique: Sharp excisional debridement to bleeding, viable wound base.  Dressing: Dry, sterile, compression dressing. Disposition: Patient tolerated procedure well.   Discussed the etiology and treatment options for the condition in detail with the patient. Educated patient on the topical and oral treatment options for mycotic nails. Recommended debridement of the nails today. Sharp and mechanical debridement performed of all painful and mycotic nails today. Nails debrided in length and thickness using a nail nipper to level of comfort. Discussed treatment options including appropriate shoe gear. Follow up as needed for painful nails.   No follow-ups on file.

## 2022-03-03 ENCOUNTER — Ambulatory Visit (INDEPENDENT_AMBULATORY_CARE_PROVIDER_SITE_OTHER): Payer: Medicaid Other | Admitting: Podiatry

## 2022-03-03 ENCOUNTER — Telehealth: Payer: Self-pay | Admitting: Cardiovascular Disease

## 2022-03-03 DIAGNOSIS — E11621 Type 2 diabetes mellitus with foot ulcer: Secondary | ICD-10-CM

## 2022-03-03 DIAGNOSIS — L97422 Non-pressure chronic ulcer of left heel and midfoot with fat layer exposed: Secondary | ICD-10-CM | POA: Diagnosis not present

## 2022-03-03 NOTE — Telephone Encounter (Signed)
Spoke with patient and gave him current [facility] lab results per Micheal Bell: "Recent labs show stable kidney function and electrolytes.  Continue current medications and follow-up as planned." Patient had no questions or concerns at this time. Has f/u with Dr. Audie Bell in April.

## 2022-03-03 NOTE — Telephone Encounter (Signed)
Patient returned CMA's call regarding results. 

## 2022-03-03 NOTE — Telephone Encounter (Signed)
Noted  

## 2022-03-03 NOTE — Progress Notes (Signed)
Patient in office today for a total contact cast change.   After wound was checked and measured by [name] the wound was dressed with solosite, Hydrofera Blue, ABD pad and a well-padded below the knee total contact rigid fiberglass cast and a cast boot was applied. He will follow-up with [place] in 1 week for further wound care and cast changes on Dr. Maxie Barb schedule.   Advised patient to call the office with any questions, comments or concerns. He verbalized understanding.

## 2022-03-11 ENCOUNTER — Ambulatory Visit (INDEPENDENT_AMBULATORY_CARE_PROVIDER_SITE_OTHER): Payer: Medicaid Other | Admitting: Podiatry

## 2022-03-11 ENCOUNTER — Telehealth: Payer: Self-pay | Admitting: *Deleted

## 2022-03-11 DIAGNOSIS — E11621 Type 2 diabetes mellitus with foot ulcer: Secondary | ICD-10-CM

## 2022-03-11 DIAGNOSIS — L97422 Non-pressure chronic ulcer of left heel and midfoot with fat layer exposed: Secondary | ICD-10-CM

## 2022-03-11 DIAGNOSIS — E11610 Type 2 diabetes mellitus with diabetic neuropathic arthropathy: Secondary | ICD-10-CM

## 2022-03-11 MED ORDER — CEPHALEXIN 500 MG PO CAPS: 500.0000 mg | ORAL_CAPSULE | Freq: Three times a day (TID) | ORAL | 0 refills | Status: DC

## 2022-03-11 NOTE — Addendum Note (Signed)
Addended bySherryle Lis, Angelino Rumery R on: 03/11/2022 01:56 PM   Modules accepted: Orders

## 2022-03-11 NOTE — Telephone Encounter (Signed)
Patient has been updated and pharmacy has contacted .

## 2022-03-11 NOTE — Telephone Encounter (Signed)
Patient is calling for status of an antibiotic that he thought was mentioned during visit  even though not seeing in epic notes that an antibiotic was prescribed, please advise.

## 2022-03-11 NOTE — Progress Notes (Addendum)
  Subjective:  Patient ID: Micheal Bell, male    DOB: [birth year],  MRN: [id number]  No chief complaint on file.   52 y.o. male returns with the above complaint. History confirmed with patient.  He is doing well his nails are thickened elongated causing discomfort in the cast   Objective:  Physical Exam: warm, good capillary refill, normal DP and PT pulses, reduced sensation grossly throughout the left lower extremity, onychomycosis x10 with yellow and elongated thickened nail plates, worst of which is the right hallux Left Foot: Recurrent ulceration submetatarsal 5 measures now and 2 distinct lesions with skin bridge noted in between, both are 0.5 x 0.5 x 0.3 centimeters with exposed subcutaneous tissue and today there is no malodor, minimal drainage, no erythema cellulitis or exposed bone or tendon.  Anterior leg skin tear has healed.  Blistering of distal hallux    Assessment:   1. Diabetic ulcer of left midfoot associated with type 2 diabetes mellitus, with fat layer exposed ([place])   2. Charcot's arthropathy, diabetic ([place])           Plan:  Patient was evaluated and treated and all questions answered.    Ulcer left foot -We discussed the etiology and factors that are a part of the wound healing process.  We also discussed the risk of infection both soft tissue and osteomyelitis from open ulceration.  Discussed the risk of limb loss if this happens or worsens. -Debridement as below. -Dressed with Prisma and Hydrofera Blue -Vascular testing not indicated he has palpable pulses -HgbA1c: Last A1c 6.7% in March he is due for another one soon he says -Last antibiotics: No further interval antibiotics indicated -Following debridement the wound was dressed with solosite, Hydrofera Blue, ABD pad and a well-padded below the knee total contact rigid fiberglass cast and a cast boot was applied.  He will follow-up with [place] in 1 week for further wound care and cast changes.  Betadine was  applied to the blister on the hallux and well-padded today -I placed him on keflex as a precaution for the blister so it does not get infected (will not be able to evaluate while in [facility])  Procedure: Excisional Debridement of Wound Rationale: Removal of non-viable soft tissue from the wound to promote healing.  Anesthesia: none Post-Debridement Wound Measurements: Noted above Type of Debridement: Sharp Excisional Tissue Removed: Non-viable soft tissue Depth of Debridement: subcutaneous tissue. Technique: Sharp excisional debridement to bleeding, viable wound base.  Dressing: Dry, sterile, compression dressing. Disposition: Patient tolerated procedure well.      No follow-ups on file.

## 2022-03-12 ENCOUNTER — Other Ambulatory Visit: Payer: Medicaid Other

## 2022-03-17 ENCOUNTER — Other Ambulatory Visit: Payer: Medicaid Other

## 2022-03-17 ENCOUNTER — Ambulatory Visit (INDEPENDENT_AMBULATORY_CARE_PROVIDER_SITE_OTHER): Payer: Medicaid Other | Admitting: Podiatry

## 2022-03-17 DIAGNOSIS — E11621 Type 2 diabetes mellitus with foot ulcer: Secondary | ICD-10-CM | POA: Diagnosis not present

## 2022-03-17 DIAGNOSIS — L97422 Non-pressure chronic ulcer of left heel and midfoot with fat layer exposed: Secondary | ICD-10-CM | POA: Diagnosis not present

## 2022-03-17 NOTE — Progress Notes (Signed)
  Subjective:  Patient ID: Micheal Bell, male    DOB: Sep 22, 1970,  MRN: [id number]  Chief Complaint  Patient presents with   Diabetic Ulcer    total contact change/ wound care per [name]    51 y.o. male returns with the above complaint. History confirmed with patient.    Objective:  Physical Exam: warm, good capillary refill, normal DP and PT pulses, reduced sensation grossly throughout the left lower extremity, onychomycosis x10 with yellow and elongated thickened nail plates, worst of which is the right hallux Left Foot: Recurrent ulceration submetatarsal 5 measures now and is a small lesion, it measures 0.2 x 0.2 x 0.1 centimeters with exposed subcutaneous tissue, a rim of hyperkeratotic tissue, and today there is no malodor, minimal drainage, no erythema cellulitis or exposed bone or tendon.  Anterior leg skin tear has healed.  Blistering of distal hallux has improved    Assessment:   1. Diabetic ulcer of left midfoot associated with type 2 diabetes mellitus, with fat layer exposed ([place])           Plan:  Patient was evaluated and treated and all questions answered.    Ulcer left foot -We discussed the etiology and factors that are a part of the wound healing process.  We also discussed the risk of infection both soft tissue and osteomyelitis from open ulceration.  Discussed the risk of limb loss if this happens or worsens. -Debridement as below. -Dressed with Prisma and Hydrofera Blue -Vascular testing not indicated he has palpable pulses -HgbA1c: Last A1c 6.7% in March he is due for another one soon he says -Last antibiotics: No further interval antibiotics indicated -Following debridement the wound was dressed with Prisma, Hydrofera Blue, ABD pad and a well-padded below the knee total contact rigid fiberglass cast and a cast boot was applied.  He will follow-up with [place] in 1 week for further wound care and cast changes.    Procedure: Excisional Debridement of  Wound Rationale: Removal of non-viable soft tissue from the wound to promote healing.  Anesthesia: none Post-Debridement Wound Measurements: Noted above Type of Debridement: Sharp Excisional Tissue Removed: Non-viable soft tissue Depth of Debridement: subcutaneous tissue. Technique: Sharp excisional debridement to bleeding, viable wound base.  Dressing: Dry, sterile, compression dressing. Disposition: Patient tolerated procedure well.      No follow-ups on file.

## 2022-03-23 ENCOUNTER — Other Ambulatory Visit: Payer: Self-pay | Admitting: Cardiovascular Disease

## 2022-03-24 ENCOUNTER — Ambulatory Visit (INDEPENDENT_AMBULATORY_CARE_PROVIDER_SITE_OTHER): Payer: Medicaid Other | Admitting: Podiatry

## 2022-03-24 DIAGNOSIS — E11621 Type 2 diabetes mellitus with foot ulcer: Secondary | ICD-10-CM | POA: Diagnosis not present

## 2022-03-24 DIAGNOSIS — L97422 Non-pressure chronic ulcer of left heel and midfoot with fat layer exposed: Secondary | ICD-10-CM

## 2022-03-24 NOTE — Progress Notes (Signed)
  Subjective:  Patient ID: Micheal Bell, male    DOB: [birth year],  MRN: [id number]  Chief Complaint  Patient presents with   Diabetic Ulcer    total contact change/ wound care per [name]    52 y.o. male returns with the above complaint. History confirmed with patient.    Objective:  Physical Exam: warm, good capillary refill, normal DP and PT pulses, reduced sensation grossly throughout the left lower extremity, onychomycosis x10 with yellow and elongated thickened nail plates, worst of which is the right hallux Left Foot: Recurrent ulceration submetatarsal 5 measures now and has healed with overlying epidermal growth, no drainage   Assessment:   1. Diabetic ulcer of left midfoot associated with type 2 diabetes mellitus, with fat layer exposed ([place])           Plan:  Patient was evaluated and treated and all questions answered.    Ulcer left foot -No debridement was necessary today.  A total contact cast was applied to offload the plantar foot ulcer.  Expect this will be our last cast and then can transition to weightbearing in a cam boot until skin matures    No follow-ups on file.

## 2022-03-31 ENCOUNTER — Ambulatory Visit: Payer: Medicaid Other | Admitting: Podiatry

## 2022-03-31 DIAGNOSIS — L97422 Non-pressure chronic ulcer of left heel and midfoot with fat layer exposed: Secondary | ICD-10-CM | POA: Diagnosis not present

## 2022-03-31 DIAGNOSIS — E11621 Type 2 diabetes mellitus with foot ulcer: Secondary | ICD-10-CM

## 2022-03-31 MED ORDER — MUPIROCIN 2 % EX OINT: 1.0000 | TOPICAL_OINTMENT | Freq: Two times a day (BID) | CUTANEOUS | 2 refills | Status: DC

## 2022-03-31 NOTE — Progress Notes (Signed)
  Subjective:  Patient ID: Micheal Bell, male    DOB: January 07, 1971,  MRN: [id number]  Chief Complaint  Patient presents with   Diabetic Ulcer    wound care/total contact change per [name]    52 y.o. male returns with the above complaint. History confirmed with patient.    Objective:  Physical Exam: warm, good capillary refill, normal DP and PT pulses, reduced sensation grossly throughout the left lower extremity, onychomycosis x10 with yellow and elongated thickened nail plates, worst of which is the right hallux Left Foot: Recurrent ulceration submetatarsal 5 remains healed with overlying epithelization, distal tip hallux ulcer 3 mm diameter with no signs of infection bloody scab well adhered   Assessment:   1. Diabetic ulcer of left midfoot associated with type 2 diabetes mellitus, with fat layer exposed ([place])           Plan:  Patient was evaluated and treated and all questions answered.    Ulcer left foot -Doing very well.  Total contact cast was removed today and he was transition to a short leg below-knee Cam walker boot to offload the ulceration.  I discussed with him that this will likely take 4 to 5 weeks for full maturation of the epithelium.  In the interim he will change daily with mupirocin ointment.  Rx sent to pharmacy.  May resume regular bathing.  Does not need to wear boot at night but must be on for any ambulation.  Return in 1 week for reevaluation and then will transition to 2 to 3 week visits    Return in about 1 week (around 04/07/2022) for wound care.

## 2022-04-03 ENCOUNTER — Telehealth: Payer: Self-pay | Admitting: Cardiovascular Disease

## 2022-04-03 MED ORDER — SPIRONOLACTONE 25 MG PO TABS: 25.0000 mg | ORAL_TABLET | Freq: Every day | ORAL | 0 refills | Status: DC

## 2022-04-03 NOTE — Telephone Encounter (Signed)
*  STAT* If patient is at the pharmacy, call can be transferred to refill team.   1. Which medications need to be refilled? (please list name of each medication and dose if known)  spironolactone (ALDACTONE) 25 MG tablet  2. Which pharmacy/location (including street and city if local pharmacy) is medication to be sent to? [facility] - [place], [place] - [address]  3. Do they need a 30 day or 90 day supply?  90 day supply  

## 2022-04-07 ENCOUNTER — Ambulatory Visit (INDEPENDENT_AMBULATORY_CARE_PROVIDER_SITE_OTHER): Payer: Medicaid Other | Admitting: Podiatry

## 2022-04-07 ENCOUNTER — Other Ambulatory Visit: Payer: Self-pay | Admitting: Pain Medicine

## 2022-04-07 VITALS — BP 113/69 | HR 81

## 2022-04-07 DIAGNOSIS — L97921 Non-pressure chronic ulcer of unspecified part of left lower leg limited to breakdown of skin: Secondary | ICD-10-CM

## 2022-04-07 DIAGNOSIS — L97422 Non-pressure chronic ulcer of left heel and midfoot with fat layer exposed: Secondary | ICD-10-CM | POA: Diagnosis not present

## 2022-04-07 DIAGNOSIS — E11621 Type 2 diabetes mellitus with foot ulcer: Secondary | ICD-10-CM

## 2022-04-07 NOTE — Progress Notes (Signed)
  Subjective:  Patient ID: Micheal Bell, male    DOB: 1970-09-16,  MRN: [id number]  Chief Complaint  Patient presents with   Wound Check    Patient denies nausea, vomiting, fever and chills today. No concerns voiced.    52 y.o. male returns with the above complaint. History confirmed with patient.  He says it is doing well.  He has been applying the mupirocin ointment to the toe and the old wound.  Still has a place on the lower leg  Objective:  Physical Exam: warm, good capillary refill, normal DP and PT pulses, reduced sensation grossly throughout the left lower extremity, onychomycosis x10 with yellow and elongated thickened nail plates, worst of which is the right hallux Left Foot: Ulceration subdorsal 5 remains healed.  Small 2 mm x 1 mm ulceration no signs of infection distal tip of the hallux.  1 cm ulceration lower leg limited to breakdown of skin        Assessment:   1. Diabetic ulcer of left midfoot associated with type 2 diabetes mellitus, with fat layer exposed ([name])   2. Ulcer of left lower leg, limited to breakdown of skin [facility])           Plan:  Patient was evaluated and treated and all questions answered.    Ulcer left foot -Continues to heal well.  Recommend he remain in the cam walker boot for ambulation for the next 3 weeks.  Continue mupirocin application daily to each wound site.  May continue regular bathing and moisturization of the remaining dry skin.    Return in about 3 weeks (around 04/28/2022) for wound care.

## 2022-04-14 ENCOUNTER — Ambulatory Visit: Payer: Medicaid Other | Admitting: Podiatry

## 2022-04-15 ENCOUNTER — Other Ambulatory Visit: Payer: Self-pay | Admitting: Cardiovascular Disease

## 2022-04-24 NOTE — Progress Notes (Signed)
Surgical Instructions    Your procedure is scheduled on Wednesday March 6th.  Report to [facility] Main Entrance "A" at 1 P.M., then check in with the Admitting office.  Call this number if you have problems the morning of surgery:  [phone]   If you have any questions prior to your surgery date call [phone]: Open Monday-Friday 8am-4pm If you experience any cold or flu symptoms such as cough, fever, chills, shortness of breath, etc. between now and your scheduled surgery, please notify us at the above number     Remember:  Do not eat after midnight the night before your surgery  You may drink clear liquids until 12 pm the afternoon of your surgery.   Clear liquids allowed are: Water, Non-Citrus Juices (without pulp), Carbonated Beverages, Clear Tea, Black Coffee ONLY (NO MILK, CREAM OR POWDERED CREAMER of any kind), and Gatorade    Take these medicines the morning of surgery with A SIP OF WATER: allopurinol (ZYLOPRIM) 300 MG tablet  buPROPion (WELLBUTRIN XL) 150 MG 24 hr tablet  carvedilol (COREG) 12.5 MG tablet  gabapentin (NEURONTIN) 300 MG capsule  HYDROmorphone (DILAUDID) 4 MG tablet  isosorbide mononitrate (IMDUR) 30 MG 24 hr tablet  mometasone-formoterol (DULERA) 200-5 MCG/ACT AERO  pantoprazole (PROTONIX) 40 MG tablet  rosuvastatin (CRESTOR) 20 MG tablet   IF NEEDED albuterol (PROVENTIL HFA;VENTOLIN HFA) 108 (90 Base) MCG/ACT inhaler - bring with you to the hospital albuterol (PROVENTIL) (2.5 MG/3ML) 0.083% nebulizer solution  cyclobenzaprine (FLEXERIL) 10 MG tablet  fluticasone (FLONASE) 50 MCG/ACT nasal spray  nitroGLYCERIN (NITROSTAT) 0.4 MG SL tablet  RESTASIS 0.05 % ophthalmic emulsion    Follow your surgeon's instructions on when to stop Aspirin.  If no instructions were given by your surgeon then you will need to call the office to get those instructions.     As of today, STOP taking any Aspirin (unless otherwise instructed by your surgeon) MOBIC,  Aleve, Naproxen, Ibuprofen, Motrin, Advil, Goody's, BC's, all herbal medications, fish oil, and all vitamins.  WHAT DO I DO ABOUT MY DIABETES MEDICATION?  HOLD your Semaglutide,0.25 or 0.'5MG'$ /DOS, (OZEMPIC, 0.25 OR 0.5 MG/DOSE,) 2 MG/3ML SOPN        for 7 days prior to surgery  Do not take oral diabetes medicines (Jardiance) the morning of surgery.  THE NIGHT BEFORE SURGERY, take ____12_______ units of ____Lantus_______insulin.  (If you take your Lantus in the morning then you may take 100% of the morning dose on the day before surgery)     THE MORNING OF SURGERY, take ______12_______ units of ___Lantus_______insulin.  The day of surgery, do not take other diabetes injectables, including Byetta (exenatide), Bydureon (exenatide ER), Victoza (liraglutide), or Trulicity (dulaglutide).  If your CBG is greater than 220 mg/dL, you may take  of your sliding scale (correction) dose of insulin.   HOW TO MANAGE YOUR DIABETES BEFORE AND AFTER SURGERY  Why is it important to control my blood sugar before and after surgery? Improving blood sugar levels before and after surgery helps healing and can limit problems. A way of improving blood sugar control is eating a healthy diet by:  Eating less sugar and carbohydrates  Increasing activity/exercise  Talking with your doctor about reaching your blood sugar goals High blood sugars (greater than 180 mg/dL) can raise your risk of infections and slow your recovery, so you will need to focus on controlling your diabetes during the weeks before surgery. Make sure that the doctor who takes care of your diabetes knows about your  planned surgery including the date and location.  How do I manage my blood sugar before surgery? Check your blood sugar at least 4 times a day, starting 2 days before surgery, to make sure that the level is not too high or low.  Check your blood sugar the morning of your surgery when you wake up and every 2 hours until you get to  the Short Stay unit.  If your blood sugar is less than 70 mg/dL, you will need to treat for low blood sugar: Do not take insulin. Treat a low blood sugar (less than 70 mg/dL) with  cup of clear juice (cranberry or apple), 4 glucose tablets, OR glucose gel. Recheck blood sugar in 15 minutes after treatment (to make sure it is greater than 70 mg/dL). If your blood sugar is not greater than 70 mg/dL on recheck, call [phone] for further instructions. Report your blood sugar to the short stay nurse when you get to Short Stay.  If you are admitted to the hospital after surgery: Your blood sugar will be checked by the staff and you will probably be given insulin after surgery (instead of oral diabetes medicines) to make sure you have good blood sugar levels. The goal for blood sugar control after surgery is 80-180 mg/dL.   SURGICAL INSTRUCTIONS        Do not wear jewelry  Do not wear lotions, powders, cologne or deodorant. Do not shave 48 hours prior to surgery.  Men may shave face and neck. Do not bring valuables to the hospital. Do not wear nail polish  [facility] is not responsible for any belongings or valuables.    Do NOT Smoke (Tobacco/Vaping)  24 hours prior to your procedure  If you use a CPAP at night, you may bring your mask for your overnight stay.   Contacts, glasses, hearing aids, dentures or partials may not be worn into surgery, please bring cases for these belongings   For patients admitted to the hospital, discharge time will be determined by your treatment team.   Patients discharged the day of surgery will not be allowed to drive home, and someone needs to stay with them for 24 hours.   SURGICAL WAITING ROOM VISITATION Patients having surgery or a procedure may have no more than 2 support people in the waiting area - these visitors may rotate.   Children under the age of 63 must have an adult with them who is not the patient. If the patient needs to stay at the  hospital during part of their recovery, the visitor guidelines for inpatient rooms apply. Pre-op nurse will coordinate an appropriate time for 1 support person to accompany patient in pre-op.  This support person may not rotate.   Please refer to [email or web address] for the visitor guidelines for Inpatients (after your surgery is over and you are in a regular room).    Special instructions:    Oral Hygiene is also important to reduce your risk of infection.  Remember - BRUSH YOUR TEETH THE MORNING OF SURGERY WITH YOUR REGULAR TOOTHPASTE   [place]- Preparing For Surgery  Before surgery, you can play an important role. Because skin is not sterile, your skin needs to be as free of germs as possible. You can reduce the number of germs on your skin by washing with CHG (chlorahexidine gluconate) Soap before surgery.  CHG is an antiseptic cleaner which kills germs and bonds with the skin to continue killing germs even after washing.  Please do not use if you have an allergy to CHG or antibacterial soaps. If your skin becomes reddened/irritated stop using the CHG.  Do not shave (including legs and underarms) for at least 48 hours prior to first CHG shower. It is OK to shave your face.  Please follow these instructions carefully.     Shower the NIGHT BEFORE SURGERY and the MORNING OF SURGERY with CHG Soap.   If you chose to wash your hair, wash your hair first as usual with your normal shampoo. After you shampoo, rinse your hair and body thoroughly to remove the shampoo.  Then [employer] and genitals (private parts) with your normal soap and rinse thoroughly to remove soap.  After that Use CHG Soap as you would any other liquid soap. You can apply CHG directly to the skin and wash gently with a scrungie or a clean washcloth.   Apply the CHG Soap to your body ONLY FROM THE NECK DOWN.  Do not use on open wounds or open sores. Avoid contact with  your eyes, ears, mouth and genitals (private parts). Wash Face and genitals (private parts)  with your normal soap.   Wash thoroughly, paying special attention to the area where your surgery will be performed.  Thoroughly rinse your body with warm water from the neck down.  DO NOT shower/wash with your normal soap after using and rinsing off the CHG Soap.  Pat yourself dry with a CLEAN TOWEL.  Wear CLEAN PAJAMAS to bed the night before surgery  Place CLEAN SHEETS on your bed the night before your surgery  DO NOT SLEEP WITH PETS.   Day of Surgery:  Take a shower with CHG soap. Wear Clean/Comfortable clothing the morning of surgery Do not apply any deodorants/lotions.   Remember to brush your teeth WITH YOUR REGULAR TOOTHPASTE.    If you received a COVID test during your pre-op visit, it is requested that you wear a mask when out in public, stay away from anyone that may not be feeling well, and notify your surgeon if you develop symptoms. If you have been in contact with anyone that has tested positive in the last 10 days, please notify your surgeon.    Please read over the following fact sheets that you were given.

## 2022-04-27 ENCOUNTER — Encounter (HOSPITAL_COMMUNITY)
Admission: RE | Admit: 2022-04-27 | Discharge: 2022-04-27 | Disposition: A | Discharge status: Home / Self Care | Payer: Medicaid Other | Source: Ambulatory Visit | Attending: Pain Medicine | Admitting: Pain Medicine

## 2022-04-27 ENCOUNTER — Other Ambulatory Visit: Payer: Self-pay

## 2022-04-27 ENCOUNTER — Encounter (HOSPITAL_COMMUNITY): Payer: Self-pay

## 2022-04-27 VITALS — BP 111/69 | HR 72 | Temp 98.2°F | Resp 17 | Ht 74.0 in | Wt 276.0 lb

## 2022-04-27 DIAGNOSIS — I110 Hypertensive heart disease with heart failure: Secondary | ICD-10-CM | POA: Insufficient documentation

## 2022-04-27 DIAGNOSIS — I82409 Acute embolism and thrombosis of unspecified deep veins of unspecified lower extremity: Secondary | ICD-10-CM | POA: Diagnosis not present

## 2022-04-27 DIAGNOSIS — M109 Gout, unspecified: Secondary | ICD-10-CM | POA: Diagnosis not present

## 2022-04-27 DIAGNOSIS — I639 Cerebral infarction, unspecified: Secondary | ICD-10-CM | POA: Diagnosis not present

## 2022-04-27 DIAGNOSIS — G4733 Obstructive sleep apnea (adult) (pediatric): Secondary | ICD-10-CM | POA: Insufficient documentation

## 2022-04-27 DIAGNOSIS — E785 Hyperlipidemia, unspecified: Secondary | ICD-10-CM | POA: Diagnosis not present

## 2022-04-27 DIAGNOSIS — F418 Other specified anxiety disorders: Secondary | ICD-10-CM | POA: Insufficient documentation

## 2022-04-27 DIAGNOSIS — I5022 Chronic systolic (congestive) heart failure: Secondary | ICD-10-CM | POA: Insufficient documentation

## 2022-04-27 DIAGNOSIS — K589 Irritable bowel syndrome without diarrhea: Secondary | ICD-10-CM | POA: Insufficient documentation

## 2022-04-27 DIAGNOSIS — M797 Fibromyalgia: Secondary | ICD-10-CM | POA: Insufficient documentation

## 2022-04-27 DIAGNOSIS — Z01812 Encounter for preprocedural laboratory examination: Secondary | ICD-10-CM | POA: Diagnosis present

## 2022-04-27 DIAGNOSIS — J45909 Unspecified asthma, uncomplicated: Secondary | ICD-10-CM | POA: Diagnosis not present

## 2022-04-27 DIAGNOSIS — I2510 Atherosclerotic heart disease of native coronary artery without angina pectoris: Secondary | ICD-10-CM | POA: Insufficient documentation

## 2022-04-27 DIAGNOSIS — E119 Type 2 diabetes mellitus without complications: Secondary | ICD-10-CM

## 2022-04-27 HISTORY — DX: Sleep apnea, unspecified: G47.30

## 2022-04-27 LAB — CBC
HCT: 41.9 % (ref 39.0–52.0)
Hemoglobin: 13.7 g/dL (ref 13.0–17.0)
MCH: 31.1 pg (ref 26.0–34.0)
MCHC: 32.7 g/dL (ref 30.0–36.0)
MCV: 95 fL (ref 80.0–100.0)
Platelets: 183 10*3/uL (ref 150–400)
RBC: 4.41 MIL/uL (ref 4.22–5.81)
RDW: 13.2 % (ref 11.5–15.5)
WBC: 6.9 10*3/uL (ref 4.0–10.5)
nRBC: 0 % (ref 0.0–0.2)

## 2022-04-27 LAB — BASIC METABOLIC PANEL
Anion gap: 9 (ref 5–15)
BUN: 17 mg/dL (ref 6–20)
CO2: 24 mmol/L (ref 22–32)
Calcium: 8.9 mg/dL (ref 8.9–10.3)
Chloride: 104 mmol/L (ref 98–111)
Creatinine, Ser: 1.12 mg/dL (ref 0.61–1.24)
GFR, Estimated: >60 mL/min (ref >60)
Glucose, Bld: 182 mg/dL — ABNORMAL HIGH (ref 70–99)
Potassium: 4.1 mmol/L (ref 3.5–5.1)
Sodium: 137 mmol/L (ref 135–145)

## 2022-04-27 LAB — GLUCOSE, CAPILLARY: Glucose-Capillary: 192 mg/dL — ABNORMAL HIGH (ref 70–99)

## 2022-04-27 NOTE — Progress Notes (Addendum)
PCP - Dr. Kathryne Eriksson Cardiologist - [facility]  PPM/ICD - Denies  Chest x-ray - 09/19/2020 EKG - 02/13/2022 Stress Test - 01/05/2020 ECHO - 08/06/2020 Cardiac Cath - 11/05/2021  Sleep Study - Diagnosed with sleep apnea CPAP - Does not use his CPAP  Type II diabetic Fasting Blood Sugar - 120-150.  Blood sugar 192 during PAT, ate macaroni salad at  0200 and an apple at 0300 Checks Blood Sugar twice times a day  Last dose of GLP1 agonist-  04/20/2022 GLP1 instructions: stop Ozempic 7 days prior to surgery  Blood Thinner Instructions: Denies Aspirin Instructions: Last dose 04/20/2022  ERAS Protcol - Yes, clear liquids 3 hours prior to surgery PRE-SURGERY Ensure or G2- No  COVID TEST- Denies   Anesthesia review: Yes, hx HTN DM, CAD, CVA, DVT and high cholesterol  Patient denies shortness of breath, fever, cough and chest pain at PAT appointment   All instructions explained to the patient, with a verbal understanding of the material. Patient agrees to go over the instructions while at home for a better understanding. Patient also instructed to self quarantine after being tested for COVID-19. The opportunity to ask questions was provided.

## 2022-04-28 ENCOUNTER — Ambulatory Visit (INDEPENDENT_AMBULATORY_CARE_PROVIDER_SITE_OTHER): Payer: Medicaid Other | Admitting: Podiatry

## 2022-04-28 DIAGNOSIS — L97422 Non-pressure chronic ulcer of left heel and midfoot with fat layer exposed: Secondary | ICD-10-CM

## 2022-04-28 DIAGNOSIS — E11621 Type 2 diabetes mellitus with foot ulcer: Secondary | ICD-10-CM | POA: Diagnosis not present

## 2022-04-28 LAB — HEMOGLOBIN A1C
Hgb A1c MFr Bld: 7.2 % — ABNORMAL HIGH (ref 4.8–5.6)
Mean Plasma Glucose: 160 mg/dL

## 2022-04-28 NOTE — Anesthesia Preprocedure Evaluation (Signed)
Anesthesia Evaluation  Patient identified by MRN, date of birth, ID band Patient awake    Reviewed: Allergy & Precautions, NPO status , Patient's Chart, lab work & pertinent test results, reviewed documented beta blocker date and time   History of Anesthesia Complications Negative for: history of anesthetic complications  Airway Mallampati: II  TM Distance: >3 FB Neck ROM: Full    Dental  (+) Dental Advisory Given, Partial Lower, Chipped   Pulmonary asthma , sleep apnea    Pulmonary exam normal        Cardiovascular hypertension, Pt. on medications and Pt. on home beta blockers + CAD, + Past MI, + Cardiac Stents and + DVT  Normal cardiovascular exam   '23 Cath -   Prox LAD to Mid LAD lesion was previously treated with overlapping DES stent, widely patent.   Dist LAD lesion was previously treated with long DES stent, widely patent..   Prox Cx to Mid Cx lesion was previously treated with DES stent.  Widely patent.   Prox RCA lesion is 40% stenosed.   RPDA lesion is 75% stenosed -> stable from prior catheterization, not likely cause for change in symptoms.   The left ventricular systolic function is normal. The left ventricular ejection fraction is 55-65% by visual estimate.   LV end diastolic pressure is normal.   There is no aortic valve stenosis.  '22 TTE - EF 55 to 60%. Grade I diastolic dysfunction (impaired relaxation). Trivial mitral valve regurgitation.     Neuro/Psych  PSYCHIATRIC DISORDERS Anxiety Depression    CVA, Residual Symptoms    GI/Hepatic Neg liver ROS,GERD  Medicated and Controlled,,  Endo/Other  diabetes, Type 2, Insulin Dependent   On GLP-1a, off over 1 week Obesity   Renal/GU negative Renal ROS     Musculoskeletal  (+) Arthritis ,  Fibromyalgia - Gout    Abdominal   Peds  Hematology negative hematology ROS (+)   Anesthesia Other Findings   Reproductive/Obstetrics                              Anesthesia Physical Anesthesia Plan  ASA: 3  Anesthesia Plan: General   Post-op Pain Management: Tylenol PO (pre-op)*   Induction: Intravenous  PONV Risk Score and Plan: 1 and Treatment may vary due to age or medical condition, Ondansetron, Dexamethasone and Midazolam  Airway Management Planned: Oral ETT  Additional Equipment: None  Intra-op Plan:   Post-operative Plan: Extubation in OR  Informed Consent: I have reviewed the patients History and Physical, chart, labs and discussed the procedure including the risks, benefits and alternatives for the proposed anesthesia with the patient or authorized representative who has indicated his/her understanding and acceptance.     Dental advisory given  Plan Discussed with: CRNA, Anesthesiologist and Surgeon  Anesthesia Plan Comments:         Anesthesia Quick Evaluation

## 2022-04-28 NOTE — Progress Notes (Signed)
Anesthesia Chart Review:  52 year old male with pertinent history including CAD, OSA on BiPAP, asthma, chronic systolic heart failure with improved EF, hypertension, hyperlipidemia, IDDM2, DVT, CVA, IBS, fibromyalgia, anxiety, depression, and gout.   He was hospitalized in August 2021 with sepsis.  Echocardiogram at the time showed EF 30 to 35% moderately decreased LV function.  Nuclear study in November 2021 was abnormal, EF 34%.  Follow-up cardiac catheterization revealed severe multivessel disease, CABG was deferred due to overall physical deconditioning.  He underwent successful complex PCI of the proximal to distal LAD with orbital atherectomy and DES x3 as well as successful PCI of the LCx/OM1 with a long DESx1.  He was noted to have residual PDA lesion however, this was a small caliber vessel, medical management was advised. Echocardiogram in 07/2020 showed EF 55 to 60%, normal LV function, no RWMA, G1 DD, no significant valvular abnormalities.  At his follow-up visit in 10/2021 and reported progressive chest pain.  Cardiac cath 11/05/2021 shwoed patent stents to LAD, patent stent to LCx, 40% pRCA stenosis, and 75% RPDA lesion, stable from prior cath, EF 55-60%.  Ongoing medical therapy was advised, chest discomfort felt to be non anginal.   He was last seen by cardiology APP Diona Browner, NP on 02/13/2022.  At that time he reported no further episodes of chest discomfort since starting on Imdur.  He was continued on aspirin, carvedilol, Entresto, Jardiance, Lasix, spironolactone, and Crestor.  No changes made to management.  He was recommended to follow-up in April 2024.  Preop labs reviewed, WNL.  IDDM 2 region well-controlled, A1c 7.2.  EKG 02/11/2022: NSR.  Rate 75.  Minimal voltage criteria for LVH, may be normal variant.  Cath 11/05/2021:   Prox LAD to Mid LAD lesion was previously treated with overlapping DES stent, widely patent.   Dist LAD lesion was previously treated with long DES stent,  widely patent..   Prox Cx to Mid Cx lesion was previously treated with DES stent.  Widely patent.   Prox RCA lesion is 40% stenosed.   RPDA lesion is 75% stenosed -> stable from prior catheterization, not likely cause for change in symptoms.   The left ventricular systolic function is normal. The left ventricular ejection fraction is 55-65% by visual estimate.   LV end diastolic pressure is normal.   There is no aortic valve stenosis.   POST-OPERATIVE DIAGNOSES Widely patent LAD & LCx Stents with minimal RCA disease - stable from post PCI angiography. RPDA lesion seems to be stable if not somewhat improved from last catheterization.  Not likely to be a culprit lesion for new onset/recurrent pain Poor Imaging on LV Gram -but EF appears to be normal with no wall motion normality normal EDP (11 mmHg)  Very left lateral takeoff of the innominate artery makes it very difficult to manipulate catheters, for future cases, would recommend left radial or femoral access.   RECOMMENDATIONS Consider nonanginal cause for chest pain. Follow-up with primary cardiology.  TTE 08/06/2020:  1. Left ventricular ejection fraction, by estimation, is 55 to 60%. The  left ventricle has normal function. The left ventricle has no regional  wall motion abnormalities. Left ventricular diastolic parameters are  consistent with Grade I diastolic  dysfunction (impaired relaxation).   2. Right ventricular systolic function is normal. The right ventricular  size is normal. Tricuspid regurgitation signal is inadequate for assessing  PA pressure.   3. The mitral valve is abnormal. Trivial mitral valve regurgitation.   4. The aortic valve is  tricuspid. Aortic valve regurgitation is not  visualized.   Comparison(s): Changes from prior study are noted. 10/12/2019: LVEF 30-35%.     Wynonia Musty [facility] Short Stay Center/Anesthesiology Phone [phone] 04/28/2022 12:58 PM

## 2022-04-28 NOTE — Progress Notes (Signed)
  Subjective:  Patient ID: Micheal Bell, male    DOB: [birth year],  MRN: [id number]  Chief Complaint  Patient presents with   Diabetic Ulcer    wound care left foot    52 y.o. male returns with the above complaint. History confirmed with patient.  He says it is doing well he has been using the boot  Objective:  Physical Exam: warm, good capillary refill, normal DP and PT pulses, reduced sensation grossly throughout the left lower extremity, onychomycosis x10 with yellow and elongated thickened nail plates, worst of which is the right hallux Left Foot: Ulcer on hallux and fifth metatarsal remain healed lower leg ulceration has healed   Assessment:   1. Diabetic ulcer of left midfoot associated with type 2 diabetes mellitus, with fat layer exposed ([facility])           Plan:  Patient was evaluated and treated and all questions answered.    Ulcer left foot -Overall doing well and continues to improve.  May leave open to air at this point and return to regular shoe gear use moisturizing lotion.  I did see him back in a few weeks for follow-up to ensure that it remains healed and we will plan to transition to at risk diabetic footcare on a routine schedule at that point.    Return in about 4 weeks (around 05/26/2022).

## 2022-04-29 ENCOUNTER — Encounter (HOSPITAL_COMMUNITY): Payer: Self-pay | Admitting: Pain Medicine

## 2022-04-29 ENCOUNTER — Ambulatory Visit (HOSPITAL_COMMUNITY)
Admission: RE | Admit: 2022-04-29 | Discharge: 2022-04-29 | Disposition: A | Discharge status: Home / Self Care | Payer: Medicaid Other | Attending: Pain Medicine | Admitting: Pain Medicine

## 2022-04-29 ENCOUNTER — Ambulatory Visit (HOSPITAL_COMMUNITY): Payer: Medicaid Other | Admitting: Physician Assistant

## 2022-04-29 ENCOUNTER — Encounter (HOSPITAL_COMMUNITY)
Admission: RE | Disposition: A | Discharge status: Home / Self Care | Payer: Self-pay | Source: Home / Self Care | Attending: Pain Medicine

## 2022-04-29 ENCOUNTER — Ambulatory Visit (HOSPITAL_COMMUNITY): Payer: Medicaid Other

## 2022-04-29 ENCOUNTER — Ambulatory Visit: Payer: Self-pay | Admitting: Pain Medicine

## 2022-04-29 ENCOUNTER — Other Ambulatory Visit: Payer: Self-pay

## 2022-04-29 ENCOUNTER — Ambulatory Visit (HOSPITAL_BASED_OUTPATIENT_CLINIC_OR_DEPARTMENT_OTHER): Payer: Medicaid Other | Admitting: Certified Registered"

## 2022-04-29 DIAGNOSIS — I10 Essential (primary) hypertension: Secondary | ICD-10-CM | POA: Insufficient documentation

## 2022-04-29 DIAGNOSIS — E1142 Type 2 diabetes mellitus with diabetic polyneuropathy: Secondary | ICD-10-CM | POA: Insufficient documentation

## 2022-04-29 DIAGNOSIS — J45909 Unspecified asthma, uncomplicated: Secondary | ICD-10-CM | POA: Insufficient documentation

## 2022-04-29 DIAGNOSIS — G4733 Obstructive sleep apnea (adult) (pediatric): Secondary | ICD-10-CM | POA: Insufficient documentation

## 2022-04-29 DIAGNOSIS — I252 Old myocardial infarction: Secondary | ICD-10-CM | POA: Diagnosis not present

## 2022-04-29 DIAGNOSIS — I2510 Atherosclerotic heart disease of native coronary artery without angina pectoris: Secondary | ICD-10-CM | POA: Diagnosis not present

## 2022-04-29 DIAGNOSIS — E669 Obesity, unspecified: Secondary | ICD-10-CM | POA: Diagnosis not present

## 2022-04-29 DIAGNOSIS — G894 Chronic pain syndrome: Secondary | ICD-10-CM | POA: Insufficient documentation

## 2022-04-29 DIAGNOSIS — Z6834 Body mass index (BMI) 34.0-34.9, adult: Secondary | ICD-10-CM | POA: Diagnosis not present

## 2022-04-29 DIAGNOSIS — Z955 Presence of coronary angioplasty implant and graft: Secondary | ICD-10-CM | POA: Insufficient documentation

## 2022-04-29 DIAGNOSIS — M1990 Unspecified osteoarthritis, unspecified site: Secondary | ICD-10-CM | POA: Insufficient documentation

## 2022-04-29 DIAGNOSIS — Z86718 Personal history of other venous thrombosis and embolism: Secondary | ICD-10-CM | POA: Insufficient documentation

## 2022-04-29 DIAGNOSIS — Z8673 Personal history of transient ischemic attack (TIA), and cerebral infarction without residual deficits: Secondary | ICD-10-CM | POA: Insufficient documentation

## 2022-04-29 DIAGNOSIS — E119 Type 2 diabetes mellitus without complications: Secondary | ICD-10-CM

## 2022-04-29 DIAGNOSIS — Z794 Long term (current) use of insulin: Secondary | ICD-10-CM

## 2022-04-29 DIAGNOSIS — M797 Fibromyalgia: Secondary | ICD-10-CM | POA: Insufficient documentation

## 2022-04-29 HISTORY — PX: SPINAL CORD STIMULATOR INSERTION: SHX5378

## 2022-04-29 LAB — GLUCOSE, CAPILLARY
Glucose-Capillary: 129 mg/dL — ABNORMAL HIGH (ref 70–99)
Glucose-Capillary: 130 mg/dL — ABNORMAL HIGH (ref 70–99)

## 2022-04-29 SURGERY — INSERTION, SPINAL CORD STIMULATOR, LUMBAR
Anesthesia: Monitor Anesthesia Care

## 2022-04-29 MED ORDER — ONDANSETRON HCL 4 MG/2ML IJ SOLN
INTRAMUSCULAR | Status: AC
  Filled 2022-04-29: qty 4

## 2022-04-29 MED ORDER — DEXTROSE 5 % IV SOLN
INTRAVENOUS | Status: DC | PRN
  Administered 2022-04-29: 3 g via INTRAVENOUS

## 2022-04-29 MED ORDER — LIDOCAINE 2% (20 MG/ML) 5 ML SYRINGE
INTRAMUSCULAR | Status: DC | PRN
  Administered 2022-04-29: 60 mg via INTRAVENOUS

## 2022-04-29 MED ORDER — DEXAMETHASONE SODIUM PHOSPHATE 10 MG/ML IJ SOLN
INTRAMUSCULAR | Status: DC | PRN
  Administered 2022-04-29: 10 mg via INTRAVENOUS

## 2022-04-29 MED ORDER — HYDROMORPHONE HCL 1 MG/ML IJ SOLN
0.2500 mg | INTRAMUSCULAR | Status: DC | PRN
  Administered 2022-04-29: 0.5 mg via INTRAVENOUS

## 2022-04-29 MED ORDER — INSULIN ASPART 100 UNIT/ML IJ SOLN: 0.0000 [IU] | INTRAMUSCULAR | Status: DC | PRN

## 2022-04-29 MED ORDER — HYDROMORPHONE HCL 1 MG/ML IJ SOLN
INTRAMUSCULAR | Status: AC
  Filled 2022-04-29: qty 1

## 2022-04-29 MED ORDER — SUGAMMADEX SODIUM 200 MG/2ML IV SOLN
INTRAVENOUS | Status: DC | PRN
  Administered 2022-04-29: 300 mg via INTRAVENOUS

## 2022-04-29 MED ORDER — LACTATED RINGERS IV SOLN: INTRAVENOUS | Status: DC

## 2022-04-29 MED ORDER — 0.9 % SODIUM CHLORIDE (POUR BTL) OPTIME
TOPICAL | Status: DC | PRN
  Administered 2022-04-29: 1000 mL

## 2022-04-29 MED ORDER — EPHEDRINE 5 MG/ML INJ
INTRAVENOUS | Status: AC
  Filled 2022-04-29: qty 5

## 2022-04-29 MED ORDER — MIDAZOLAM HCL 2 MG/2ML IJ SOLN
INTRAMUSCULAR | Status: AC
  Filled 2022-04-29: qty 2

## 2022-04-29 MED ORDER — BUPIVACAINE HCL 0.25 % IJ SOLN
INTRAMUSCULAR | Status: DC | PRN
  Administered 2022-04-29: 10 mL

## 2022-04-29 MED ORDER — ALBUTEROL SULFATE HFA 108 (90 BASE) MCG/ACT IN AERS
INHALATION_SPRAY | RESPIRATORY_TRACT | Status: DC | PRN
  Administered 2022-04-29: 8 via RESPIRATORY_TRACT

## 2022-04-29 MED ORDER — LIDOCAINE 2% (20 MG/ML) 5 ML SYRINGE
INTRAMUSCULAR | Status: AC
  Filled 2022-04-29: qty 10

## 2022-04-29 MED ORDER — ORAL CARE MOUTH RINSE: 15.0000 mL | Freq: Once | OROMUCOSAL | Status: AC

## 2022-04-29 MED ORDER — ROCURONIUM BROMIDE 10 MG/ML (PF) SYRINGE
PREFILLED_SYRINGE | INTRAVENOUS | Status: AC
  Filled 2022-04-29: qty 30

## 2022-04-29 MED ORDER — PROMETHAZINE HCL 25 MG/ML IJ SOLN: 6.2500 mg | INTRAMUSCULAR | Status: DC | PRN

## 2022-04-29 MED ORDER — CHLORHEXIDINE GLUCONATE 0.12 % MT SOLN
15.0000 mL | Freq: Once | OROMUCOSAL | Status: AC
  Administered 2022-04-29: 15 mL via OROMUCOSAL
  Filled 2022-04-29: qty 15

## 2022-04-29 MED ORDER — ONDANSETRON HCL 4 MG/2ML IJ SOLN
INTRAMUSCULAR | Status: DC | PRN
  Administered 2022-04-29: 4 mg via INTRAVENOUS

## 2022-04-29 MED ORDER — MEPERIDINE HCL 25 MG/ML IJ SOLN: 6.2500 mg | INTRAMUSCULAR | Status: DC | PRN

## 2022-04-29 MED ORDER — ALBUTEROL SULFATE HFA 108 (90 BASE) MCG/ACT IN AERS
INHALATION_SPRAY | RESPIRATORY_TRACT | Status: AC
  Filled 2022-04-29: qty 6.7

## 2022-04-29 MED ORDER — FENTANYL CITRATE (PF) 250 MCG/5ML IJ SOLN
INTRAMUSCULAR | Status: AC
  Filled 2022-04-29: qty 5

## 2022-04-29 MED ORDER — BACITRACIN-NEOMYCIN-POLYMYXIN OINTMENT TUBE
TOPICAL_OINTMENT | CUTANEOUS | Status: AC
  Filled 2022-04-29: qty 14.17

## 2022-04-29 MED ORDER — BUPIVACAINE HCL (PF) 0.25 % IJ SOLN
INTRAMUSCULAR | Status: AC
  Filled 2022-04-29: qty 30

## 2022-04-29 MED ORDER — MIDAZOLAM HCL 2 MG/2ML IJ SOLN
INTRAMUSCULAR | Status: DC | PRN
  Administered 2022-04-29: 2 mg via INTRAVENOUS

## 2022-04-29 MED ORDER — CEFAZOLIN SODIUM 1 G IJ SOLR
INTRAMUSCULAR | Status: AC
  Filled 2022-04-29: qty 30

## 2022-04-29 MED ORDER — PROPOFOL 10 MG/ML IV BOLUS
INTRAVENOUS | Status: DC | PRN
  Administered 2022-04-29: 140 mg via INTRAVENOUS

## 2022-04-29 MED ORDER — AMISULPRIDE (ANTIEMETIC) 5 MG/2ML IV SOLN: 10.0000 mg | Freq: Once | INTRAVENOUS | Status: DC | PRN

## 2022-04-29 MED ORDER — ROCURONIUM BROMIDE 10 MG/ML (PF) SYRINGE
PREFILLED_SYRINGE | INTRAVENOUS | Status: DC | PRN
  Administered 2022-04-29: 50 mg via INTRAVENOUS
  Administered 2022-04-29: 20 mg via INTRAVENOUS

## 2022-04-29 MED ORDER — FENTANYL CITRATE (PF) 250 MCG/5ML IJ SOLN
INTRAMUSCULAR | Status: DC | PRN
  Administered 2022-04-29: 50 ug via INTRAVENOUS
  Administered 2022-04-29: 100 ug via INTRAVENOUS
  Administered 2022-04-29: 50 ug via INTRAVENOUS

## 2022-04-29 MED ORDER — BACITRACIN-NEOMYCIN-POLYMYXIN OINTMENT TUBE
TOPICAL_OINTMENT | CUTANEOUS | Status: DC | PRN
  Administered 2022-04-29: 1 via TOPICAL

## 2022-04-29 MED ORDER — DEXAMETHASONE SODIUM PHOSPHATE 10 MG/ML IJ SOLN
INTRAMUSCULAR | Status: AC
  Filled 2022-04-29: qty 2

## 2022-04-29 MED ORDER — SUCCINYLCHOLINE CHLORIDE 200 MG/10ML IV SOSY
PREFILLED_SYRINGE | INTRAVENOUS | Status: AC
  Filled 2022-04-29: qty 10

## 2022-04-29 SURGICAL SUPPLY — 64 items
ADH SKN CLS APL DERMABOND .7 (GAUZE/BANDAGES/DRESSINGS) ×1
APL PRP STRL LF DISP 70% ISPRP (MISCELLANEOUS) ×1
APL SKNCLS STERI-STRIP NONHPOA (GAUZE/BANDAGES/DRESSINGS)
BAG COUNTER SPONGE SURGICOUNT (BAG) ×1 IMPLANT
BAG DECANTER FOR FLEXI CONT (MISCELLANEOUS) ×1 IMPLANT
BAG SPNG CNTER NS LX DISP (BAG) ×1
BENZOIN TINCTURE PRP APPL 2/3 (GAUZE/BANDAGES/DRESSINGS) IMPLANT
BINDER ABDOMINAL 12 ML 46-62 (SOFTGOODS) ×1 IMPLANT
BLADE CLIPPER SURG (BLADE) IMPLANT
CHLORAPREP W/TINT 26 (MISCELLANEOUS) ×1 IMPLANT
CLEANER TIP ELECTROSURG 2X2 (MISCELLANEOUS) ×1 IMPLANT
CLIP VESOCCLUDE SM WIDE 6/CT (CLIP) IMPLANT
DERMABOND ADVANCED .7 DNX12 (GAUZE/BANDAGES/DRESSINGS) ×1 IMPLANT
DEVICE FIXATE SUTURING SINGLE (NEUROSURGERY SUPPLIES) IMPLANT
DRAPE C-ARM 42X72 X-RAY (DRAPES) ×1 IMPLANT
DRAPE C-ARMOR (DRAPES) ×1 IMPLANT
DRAPE LAPAROTOMY 100X72X124 (DRAPES) ×1 IMPLANT
DRAPE SURG 17X23 STRL (DRAPES) ×4 IMPLANT
DRSG OPSITE POSTOP 3X4 (GAUZE/BANDAGES/DRESSINGS) IMPLANT
DRSG OPSITE POSTOP 4X6 (GAUZE/BANDAGES/DRESSINGS) IMPLANT
ELECT REM PT RETURN 9FT ADLT (ELECTROSURGICAL) ×1
ELECTRODE REM PT RTRN 9FT ADLT (ELECTROSURGICAL) ×1 IMPLANT
GAUZE 4X4 16PLY ~~LOC~~+RFID DBL (SPONGE) ×1 IMPLANT
GLOVE BIO SURGEON STRL SZ8 (GLOVE) ×1 IMPLANT
GLOVE EXAM NITRILE XL STR (GLOVE) IMPLANT
GLOVE EXAM NITRILE XS STR PU (GLOVE) IMPLANT
GLOVE SRG 8 PF TXTR STRL LF DI (GLOVE) ×1 IMPLANT
GLOVE SURG UNDER POLY LF SZ8 (GLOVE) ×1
GOWN STRL REUS W/ TWL LRG LVL3 (GOWN DISPOSABLE) IMPLANT
GOWN STRL REUS W/ TWL XL LVL3 (GOWN DISPOSABLE) IMPLANT
GOWN STRL REUS W/TWL 2XL LVL3 (GOWN DISPOSABLE) IMPLANT
GOWN STRL REUS W/TWL LRG LVL3 (GOWN DISPOSABLE)
GOWN STRL REUS W/TWL XL LVL3 (GOWN DISPOSABLE)
KIT ANCHOR LEAD NEUROSTIM N300 (Stimulator) IMPLANT
KIT BASIN OR (CUSTOM PROCEDURE TRAY) ×1 IMPLANT
KIT IPG NEUROSTIM OMNIA 2500 (Stimulator) IMPLANT
KIT IPG NEURSTIM OMNIA SENZA (Stimulator) IMPLANT
KIT LEAD NEUROSTIM 50 BLUE (Stimulator) IMPLANT
KIT TUNNELER SPINAL CORD 35 (KITS) IMPLANT
KIT TURNOVER KIT B (KITS) ×1 IMPLANT
NDL 18GX1X1/2 (RX/OR ONLY) (NEEDLE) IMPLANT
NDL EPIMED EPMD1040 14X4 (NEEDLE) IMPLANT
NDL HYPO 25X1 1.5 SAFETY (NEEDLE) ×1 IMPLANT
NEEDLE 18GX1X1/2 (RX/OR ONLY) (NEEDLE) IMPLANT
NEEDLE HYPO 25X1 1.5 SAFETY (NEEDLE) ×1 IMPLANT
NS IRRIG 1000ML POUR BTL (IV SOLUTION) ×1 IMPLANT
PACK LAMINECTOMY NEURO (CUSTOM PROCEDURE TRAY) ×1 IMPLANT
PAD ARMBOARD 7.5X6 YLW CONV (MISCELLANEOUS) ×1 IMPLANT
SPONGE SURGIFOAM ABS GEL SZ50 (HEMOSTASIS) IMPLANT
SPONGE T-LAP 4X18 ~~LOC~~+RFID (SPONGE) ×1 IMPLANT
STAPLER SKIN PROX WIDE 3.9 (STAPLE) ×1 IMPLANT
STRIP CLOSURE SKIN 1/2X4 (GAUZE/BANDAGES/DRESSINGS) IMPLANT
SUT MNCRL AB 4-0 PS2 18 (SUTURE) IMPLANT
SUT SILK 0 (SUTURE) ×1
SUT SILK 0 MO-6 18XCR BRD 8 (SUTURE) ×1 IMPLANT
SUT SILK 0 TIES 10X30 (SUTURE) IMPLANT
SUT SILK 2 0 TIES 10X30 (SUTURE) IMPLANT
SUT VIC AB 2-0 CP2 18 (SUTURE) ×2 IMPLANT
SYR 10ML LL (SYRINGE) IMPLANT
SYR EPIDURAL 5ML GLASS (SYRINGE) ×1 IMPLANT
TOWEL GREEN STERILE (TOWEL DISPOSABLE) ×1 IMPLANT
TOWEL GREEN STERILE FF (TOWEL DISPOSABLE) ×1 IMPLANT
WATER STERILE IRR 1000ML POUR (IV SOLUTION) ×1 IMPLANT
YANKAUER SUCT BULB TIP NO VENT (SUCTIONS) ×1 IMPLANT

## 2022-04-29 NOTE — Anesthesia Procedure Notes (Signed)
Procedure Name: Intubation Date/Time: 04/29/2022 3:16 PM  Performed by: Carolan Clines, CRNAPre-anesthesia Checklist: Patient identified, Emergency Drugs available, Suction available and Patient being monitored Patient Re-evaluated:Patient Re-evaluated prior to induction Oxygen Delivery Method: Circle System Utilized Preoxygenation: Pre-oxygenation with 100% oxygen Induction Type: IV induction Laryngoscope Size: Mac and 4 Grade View: Grade I Tube type: Oral Tube size: 7.5 mm Number of attempts: 1 Airway Equipment and Method: Stylet Placement Confirmation: ETT inserted through vocal cords under direct vision, positive ETCO2 and breath sounds checked- equal and bilateral Secured at: 22 cm Tube secured with: Tape Dental Injury: Teeth and Oropharynx as per pre-operative assessment  Comments: Two hand mask r/t full beard.

## 2022-04-29 NOTE — Transfer of Care (Signed)
Immediate Anesthesia Transfer of Care Note  Patient: Micheal Bell  Procedure(s) Performed: Permanent Spinal cord stimulator placement  Patient Location: PACU  Anesthesia Type:General  Level of Consciousness: awake, alert , and oriented  Airway & Oxygen Therapy: Patient Spontanous Breathing  Post-op Assessment: Report given to RN and Post -op Vital signs reviewed and stable  Post vital signs: Reviewed and stable  Last Vitals:  Vitals Value Taken Time  BP    Temp    Pulse 90 04/29/22 1741  Resp 29 04/29/22 1741  SpO2 98 % 04/29/22 1741  Vitals shown include unvalidated device data.  Last Pain:  Vitals:   04/29/22 1341  TempSrc:   PainSc: 0-No pain         Complications: No notable events documented.

## 2022-04-29 NOTE — H&P (Signed)
Cc: My feet hurt  HPI:  Patient presents for spinal cord stimulator implant.  He underwent a trial with over 50% benefit.  Pain continues to be predominantly in his feet.  Today he rates it as over 5 out of 10.  Past medical history  Coronary artery disease, obstructive sleep apnea  Medications Reviewed in epic  Allergies Lodine (causes anaphylaxis)  Review of systems Full review of systems performed today, he denies any recent history of infection.  Family history Noncontributory Social history Non-smoker  Physical examination  He is alert, he is appropriate He has good hip flexion legs such as foot plantar and dorsiflexion There is decree sensation in a stocking type distribution in his feet bilaterally His abdomen is soft He is alert, he is appropriate  Assessment plan 52 year old gentleman with painful peripheral neuropathy here for permanent spinal cord stimulator implant  1.  Today undergo permanent spinal cord stimulator implant utilizing the [employer] system. 2.   He will follow-up in clinic in 5 days for dressing change.

## 2022-04-29 NOTE — Anesthesia Postprocedure Evaluation (Addendum)
Anesthesia Post Note  Patient: Micheal Bell  Procedure(s) Performed: Permanent Spinal cord stimulator placement     Patient location during evaluation: PACU Anesthesia Type: General Level of consciousness: awake and alert Pain management: pain level controlled Vital Signs Assessment: post-procedure vital signs reviewed and stable Respiratory status: spontaneous breathing Cardiovascular status: stable Anesthetic complications: no   No notable events documented.  Last Vitals:  Vitals:   04/29/22 1745 04/29/22 1800  BP: 103/84 134/76  Pulse: 89 79  Resp: 19 16  Temp:  36.8 C  SpO2: 97% 98%    Last Pain:  Vitals:   04/29/22 1800  TempSrc:   PainSc: [name]

## 2022-04-29 NOTE — Op Note (Signed)
Operative diagnosis 1.  Diabetic peripheral neuropathy 2.  Painful peripheral neuropathy 3.  Chronic pain syndrome  Postoperative diagnosis Same as above  Procedure 1.  Permanent spinal cord stimulator lead implant x  (percutaneous) under direct fluoroscopic guidance 2.  Spinal cord stimulator battery implant  Surgeon Arturo Morton. Chalisa Kobler  Anesthesia General  Description of procedure in detail Patient was met in the holding area with his mother.  Questions were answered.  Consent was obtained.  An IV was placed by nursing staff.  The patient was then taken to the operating room and placed under general anesthesia.  He is then placed prone, and prepped as well as draped in the usual sterile fashion.  A timeout was performed.  3 g of Ancef was provided.  Under direct fluoroscopic guidance, the L1-L2 epidural space was identified, the space was accessed utilizing a coud needle, upon loss of resistance, clear fluid suggestive of cerebrospinal fluid was obtained.  This level was immediately abandon.  Next attention was addressed at the L2-3 interspace.  Once again the space was accessed using a coud needle.  An 8 contact Nevro spinal cord stimulator lead was then threaded to the interspace of T8-T9 slightly to the right.  A second lead was then placed in identical fashion.  Lateral imaging noted contiuous lead placement in the posterior epidural space.  Next a 2 inch linear region between the 2 needles was anesthetized with 0.25% bupivacaine with epinephrine.  An incision was made.  Dissection was carried down to the supraspinous ligament.  Each needle was then independently dissected through the tissue.  Needle and stylette's were each withdrawn.  The leads were then pulled into the pocket.  Fluoroscopic imaging demonstrated appropriate lead placement.  Each lead was then independently anchored utilizing manufacture provided anchors along with a stay fix device.  The leads were secured to the anchors  utilizing a hex wrench.  Final imaging noted appropriate lead placement with no migration.  Next attention was directed to the right flank.  Once again a 2 inch linear region was anesthetized with 0.25% bupivacaine with epinephrine.  Incision was made.  A pocket was created.  The leads were then tunneled from the midline incision to the flank.  The leads were connected to the battery.  Appropriate parameters were obtained by our Nevro representative.  The leads were then anchored to the battery using a hex wrench.  Each pocket was then irrigated with sterile saline, hemostasis was achieved.  Each pocket was then closed with series of 2-0 Vicryl sutures followed by staples.  Implants Nevro spinal cord stimulator lead x 2 Nevro omnia battery [employer] stay fix device x 2  Complications None  Specimens None  Fluids Per anesthesia  Disposition Patient was taken to the recovery room, he will then be discharged home with his mother.  He will follow-up in clinic in 5 days for dressing change.

## 2022-05-01 NOTE — Addendum Note (Signed)
Addendum  created 05/01/22 1835 by Nolon Nations, MD   Clinical Note Signed

## 2022-05-05 ENCOUNTER — Encounter (HOSPITAL_COMMUNITY): Payer: Self-pay | Admitting: Pain Medicine

## 2022-05-18 ENCOUNTER — Other Ambulatory Visit: Payer: Self-pay | Admitting: Cardiovascular Disease

## 2022-05-28 ENCOUNTER — Ambulatory Visit: Payer: Medicaid Other | Admitting: Podiatry

## 2022-05-28 DIAGNOSIS — E1142 Type 2 diabetes mellitus with diabetic polyneuropathy: Secondary | ICD-10-CM

## 2022-05-28 DIAGNOSIS — B351 Tinea unguium: Secondary | ICD-10-CM | POA: Diagnosis not present

## 2022-05-28 DIAGNOSIS — Z8631 Personal history of diabetic foot ulcer: Secondary | ICD-10-CM | POA: Diagnosis not present

## 2022-05-28 DIAGNOSIS — M79675 Pain in left toe(s): Secondary | ICD-10-CM

## 2022-05-28 DIAGNOSIS — M216X2 Other acquired deformities of left foot: Secondary | ICD-10-CM | POA: Diagnosis not present

## 2022-05-28 DIAGNOSIS — Q66229 Congenital metatarsus adductus, unspecified foot: Secondary | ICD-10-CM | POA: Diagnosis not present

## 2022-05-28 DIAGNOSIS — M79674 Pain in right toe(s): Secondary | ICD-10-CM | POA: Diagnosis not present

## 2022-05-28 DIAGNOSIS — M216X1 Other acquired deformities of right foot: Secondary | ICD-10-CM

## 2022-05-28 NOTE — Progress Notes (Signed)
  Subjective:  Patient ID: Micheal Bell, male    DOB: [birth year],  MRN: [id number]  Chief Complaint  Patient presents with   Foot Ulcer    wound care and nail trim    52 y.o. male returns with the above complaint. History confirmed with patient.  He says it continues to do well he has been wearing regular shoe gear, he is wearing cowboy boots today and says these are the most comfortable shoes he can find for his feet, sketchers were not helpful.  His nails are thickened elongated and causing pressure into the edges, would like them debrided today this has been helpful in the past  Objective:  Physical Exam: warm, good capillary refill, normal DP and PT pulses, reduced sensation grossly throughout the left lower extremity, onychomycosis x10 with yellow and elongated thickened nail plates, worst of which is the right hallux, incurvated nail borders with blisters around the hallux nail medial lateral borders, no signs of infection.  He has a C-shaped foot with metatarsus adductus and pes cavus deformity Left Foot: Ulcer on hallux and fifth metatarsal remain healed lower leg ulceration has healed   Assessment:   1. Type 2 diabetes mellitus with diabetic polyneuropathy, without long-term current use of insulin   2. Diabetic peripheral neuropathy   3. Metatarsus adductus   4. Acquired bilateral pes cavus   5. History of diabetic ulcer of foot   6. Pain due to onychomycosis of toenails of both feet           Plan:  Patient was evaluated and treated and all questions answered.    Ulcer left foot -Ulcer remains healed.  We discussed appropriate shoe gear to offload the ulcer area appropriately to reduce risk of recurrence.  Currently he is wearing cowboy shoes and I do not think long-term these will be good for him due to his unique foot shape.  I recommended diabetic extra-depth insoles with him accommodative insole.  Prescription was given to him, he is going to check with [place]  [facility] to see if they still do this and we will look to see if there are any other resources in [facility] to do this.  Discussed the etiology and treatment options for the condition in detail with the patient.  Prior debridements have been helpful. Recommended debridement of the nails today. Sharp and mechanical debridement performed of all painful and mycotic nails today. Nails debrided in length and thickness using a nail nipper to level of comfort. Discussed treatment options including appropriate shoe gear. Follow up as needed for painful nails.  Advised on Epsom salt soaks to reduce the blistering around the bilateral hallux nail borders    Return in about 10 weeks (around 08/06/2022) for at risk diabetic foot care.

## 2022-06-15 ENCOUNTER — Other Ambulatory Visit: Payer: Self-pay | Admitting: Cardiovascular Disease

## 2022-06-16 ENCOUNTER — Ambulatory Visit: Payer: Medicaid Other | Admitting: Cardiovascular Disease

## 2022-06-24 ENCOUNTER — Ambulatory Visit: Payer: Medicaid Other | Admitting: Cardiovascular Disease

## 2022-07-09 NOTE — Progress Notes (Signed)
Cardiology Office Note:   Date:  07/10/2022  NAME:  Micheal Bell    MRN: [id number] DOB:  [birth year]   PCP:  [name], MD  Cardiologist:  [name], MD  Electrophysiologist:  None   Referring MD: [name], MD   Chief Complaint  Patient presents with   Follow-up        History of Present Illness:   Micheal Bell is a 52 y.o. male with a hx of CAD, CHF, DM, HTN, HLD who presents for follow-up. Had CP in September concerning for unstable angina. LHC unchanged with patent stents.  He reports he is doing well.  He has sleep apnea.  He is awaiting treatment with the inspire device.  Can wake up with chest discomfort.  Symptoms are much improved on Imdur.  He does get short of breath with activity.  I encouraged him to remain active.  No signs of volume overload.  Taking Lasix daily.  On appropriate heart failure medications.  Ejection fraction has improved.  Overall doing quite well.  Problem List 1. CAD -2v PCI -> LAD/LCX 02/20/2020 -LHC unchanged 11/13/2021 2. Systolic HF with recovery of EF -EF 30-35% 10/12/2019 -EF 55-60% 08/06/2020 3. DM -A1c 7.2 4. HTN 5. CVA -prior L frontal lobe 6. HLD -T chol 119, HDL 58, LDL 42, TG 107 7. DVT -R popliteal vein eliquis started 11/11/2019 -completed 3 months therapy  8. Smokeless tobacco -Dip user for 45 years  9. OSA 10. Spinal cord stimulator - neuropathy   Past Medical History: Past Medical History:  Diagnosis Date   Anxiety    Arthritis    Asthma    inhaler daily   CHF (congestive heart failure) (HCC)    Coronary artery disease    Depression    Diabetes mellitus without complication (HCC)    DVT (deep venous thrombosis) (HCC)    Dyspnea    Fibromyalgia    Gout    History of kidney stones    Hypertension    IBS (irritable bowel syndrome)    Myocardial infarction (HCC) 09/2019   Pneumonia 09/2019   Sleep apnea    Stroke (HCC) 09/2019   Vein disorder     Past Surgical History: Past Surgical  History:  Procedure Laterality Date   CORONARY ATHERECTOMY N/A 02/20/2020   Procedure: CORONARY ATHERECTOMY;  Surgeon: [name], [name], MD;  Location: [facility] INVASIVE CV LAB;  Service: Cardiovascular;  Laterality: N/A;  CSI LAD   CORONARY STENT INTERVENTION N/A 02/20/2020   Procedure: CORONARY STENT INTERVENTION;  Surgeon: [name], [name], MD;  Location: [facility] INVASIVE CV LAB;  Service: Cardiovascular;  Laterality: N/A;  LAD CFX   CORONARY ULTRASOUND/IVUS N/A 02/20/2020   Procedure: Intravascular Ultrasound/IVUS;  Surgeon: [name], [name], MD;  Location: [facility] INVASIVE CV LAB;  Service: Cardiovascular;  Laterality: N/A;   FOOT SURGERY     LEFT HEART CATH AND CORONARY ANGIOGRAPHY N/A 02/12/2020   Procedure: LEFT HEART CATH AND CORONARY ANGIOGRAPHY;  Surgeon: [name], MD;  Location: [facility] INVASIVE CV LAB;  Service: Cardiovascular;  Laterality: N/A;   LEFT HEART CATH AND CORONARY ANGIOGRAPHY N/A 11/05/2021   Procedure: LEFT HEART CATH AND CORONARY ANGIOGRAPHY;  Surgeon: Marykay Lex, MD;  Location: [facility] INVASIVE CV LAB;  Service: Cardiovascular;  Laterality: N/A;   REVERSE SHOULDER ARTHROPLASTY Right 10/17/2020   Procedure: REVERSE SHOULDER ARTHROPLASTY;  Surgeon: Jones Broom, MD;  Location: [facility] ORS;  Service: Orthopedics;  Laterality: Right;   SPINAL CORD STIMULATOR  INSERTION N/A 04/29/2022   Procedure: Permanent Spinal cord stimulator placement;  Surgeon: Renaldo Fiddler, MD;  Location: [facility] OR;  Service: Neurosurgery;  Laterality: N/A;  RM 21   TEE WITHOUT CARDIOVERSION N/A 10/24/2019   Procedure: TRANSESOPHAGEAL ECHOCARDIOGRAM (TEE);  Surgeon: [name], MD;  Location: [facility] ENDOSCOPY;  Service: Cardiovascular;  Laterality: N/A;    Current Medications: Current Meds  Medication Sig   ACCU-CHEK GUIDE test strip Use to check blood glucose twice daily   Accu-Chek Softclix Lancets lancets 2 (two) times daily.   albuterol (PROVENTIL HFA;VENTOLIN HFA) 108 (90 Base) MCG/ACT inhaler Inhale 1-2 puffs  into the lungs every 4 (four) hours as needed for wheezing or shortness of breath.   albuterol (PROVENTIL) (2.5 MG/3ML) 0.083% nebulizer solution Take 2.5 mg by nebulization 3 (three) times daily as needed for wheezing or shortness of breath.   allopurinol (ZYLOPRIM) 300 MG tablet Take 300 mg by mouth daily.   Ascorbic Acid (VITAMIN C PO) Take 1 tablet by mouth daily.   aspirin EC 81 MG tablet Take 1 tablet (81 mg total) by mouth daily. Swallow whole.   buPROPion (WELLBUTRIN XL) 150 MG 24 hr tablet Take 150 mg by mouth every morning.   carvedilol (COREG) 12.5 MG tablet TAKE ONE TABLET BY MOUTH TWICE DAILY WITH A MEAL   cephALEXin (KEFLEX) 500 MG capsule Take 1 capsule (500 mg total) by mouth 3 (three) times daily.   ciclopirox (PENLAC) 8 % solution Apply topically at bedtime. Apply over nail and surrounding skin. Apply daily over previous coat. After seven (7) days, may remove with alcohol and continue cycle. (Patient taking differently: Apply 1 Application topically at bedtime as needed (nail fungus). Apply over nail and surrounding skin. Apply daily over previous coat. After seven (7) days, may remove with alcohol and continue cycle.)   cyclobenzaprine (FLEXERIL) 10 MG tablet Take 0.5 tablets (5 mg total) by mouth 2 (two) times daily as needed for muscle spasms. (Patient taking differently: Take 10 mg by mouth 3 (three) times daily as needed for muscle spasms.)   empagliflozin (JARDIANCE) 10 MG TABS tablet Take 1 tablet (10 mg total) by mouth daily before breakfast.   ENTRESTO 24-26 MG TAKE ONE TABLET BY MOUTH TWICE DAILY   fluticasone (FLONASE) 50 MCG/ACT nasal spray Place 1 spray into both nostrils daily as needed for allergies.   furosemide (LASIX) 20 MG tablet Lasix 20 mg daily. May take an additional 20 mg on days of increased swelling and weight gain of 3 lb overnight or 5 lb in 1 week.   gabapentin (NEURONTIN) 300 MG capsule Take 600-1,200 mg by mouth See admin instructions. Take 600 mg by  mouth in the morning and afternoon and take 1200 mg at bedtime.   HYDROmorphone (DILAUDID) 4 MG tablet Take 1 tablet (4 mg total) by mouth in the morning, at noon, and at bedtime.   insulin glargine (LANTUS) 100 UNIT/ML Solostar Pen Inject 20 Units into the skin daily. (Patient taking differently: Inject 25 Units into the skin at bedtime.)   isosorbide mononitrate (IMDUR) 30 MG 24 hr tablet Take 1 tablet (30 mg total) by mouth daily.   lidocaine (XYLOCAINE) 5 % ointment APPLY TO AFFECTED AREA TOPICALLY AS NEEDED   meloxicam (MOBIC) 15 MG tablet Take 15 mg by mouth daily.   metoprolol tartrate (LOPRESSOR) 50 MG tablet Take 50 mg by mouth 2 (two) times daily.   Misc. Devices (RECONSTITUBE) MISC Nebulizer, tubing  J45.30  Fax to [employer]  mometasone-formoterol (DULERA) 200-5 MCG/ACT AERO Inhale 1 puff into the lungs daily.   montelukast (SINGULAIR) 10 MG tablet Take 10 mg by mouth at bedtime.   Multiple Vitamins-Minerals (MULTIVITAMIN WITH MINERALS) tablet Take 1 tablet by mouth daily.   mupirocin ointment (BACTROBAN) 2 % Apply 1 Application topically 2 (two) times daily. (Patient taking differently: Apply 1 Application topically daily as needed (abscess on foot).)   Nebulizers (COMP AIR COMPRESSOR NEBULIZER) MISC USE AS DIRECTED INCLUDE TUBING J45.30   nitroGLYCERIN (NITROSTAT) 0.4 MG SL tablet Place 1 tablet (0.4 mg total) under the tongue every 5 (five) minutes as needed for chest pain.   nortriptyline (PAMELOR) 25 MG capsule Take 25 mg by mouth at bedtime.   pantoprazole (PROTONIX) 40 MG tablet Take 40 mg by mouth daily.   PENTIPS 32G X 4 MM MISC See admin instructions. with insulin   potassium chloride SA (KLOR-CON M) 20 MEQ tablet TAKE ONE TABLET BY MOUTH AS NEEDED WITH lasix   RESTASIS 0.05 % ophthalmic emulsion Place 1 drop into both eyes 2 (two) times daily as needed (dry eyes).   rosuvastatin (CRESTOR) 20 MG tablet TAKE ONE TABLET BY MOUTH EVERY DAY   Semaglutide,0.25 or 0.5MG /DOS,  (OZEMPIC, 0.25 OR 0.5 MG/DOSE,) 2 MG/3ML SOPN Inject 0.5 mg into the skin once a week.   spironolactone (ALDACTONE) 25 MG tablet Take 1 tablet (25 mg total) by mouth daily.   triamcinolone (KENALOG) 0.1 % Apply 1 application topically 2 (two) times daily as needed (irritation).   zinc gluconate 50 MG tablet Take 50 mg by mouth daily.   zolpidem (AMBIEN) 10 MG tablet Take 10 mg by mouth at bedtime.   Current Facility-Administered Medications for the 07/10/22 encounter (Office Visit) with Sande Rives, MD  Medication   sodium chloride flush (NS) 0.9 % injection 3 mL     Allergies:    Azithromycin and Lodine [etodolac]   Social History: Social History   Socioeconomic History   Marital status: Single    Spouse name: Not on file   Number of children: Not on file   Years of education: Not on file   Highest education level: Not on file  Occupational History   Occupation: disabled  Tobacco Use   Smoking status: Never   Smokeless tobacco: Current    Types: Snuff  Vaping Use   Vaping Use: Never used  Substance and Sexual Activity   Alcohol use: Never   Drug use: Never   Sexual activity: Not on file  Other Topics Concern   Not on file  Social History Narrative   Lives alone, mom is next door   Right handed   Drinks no caffeine   Social Determinants of Health   Financial Resource Strain: Not on file  Food Insecurity: Not on file  Transportation Needs: Not on file  Physical Activity: Not on file  Stress: Not on file  Social Connections: Not on file     Family History: The patient's family history includes Diabetes in his mother; Heart disease in his father and mother.  ROS:   All other ROS reviewed and negative. Pertinent positives noted in the HPI.     EKGs/Labs/Other Studies Reviewed:   The following studies were personally reviewed by me today:   Recent Labs: 02/13/2022: ALT 17 04/27/2022: BUN 17; Creatinine, Ser 1.12; Hemoglobin 13.7; Platelets 183;  Potassium 4.1; Sodium 137   Recent Lipid Panel    Component Value Date/Time   CHOL 119 02/13/2022 1135   TRIG  107 02/13/2022 1135   HDL 58 02/13/2022 1135   CHOLHDL 2.1 02/13/2022 1135   CHOLHDL 3.9 10/26/2019 0402   VLDL 26 10/26/2019 0402   LDLCALC 42 02/13/2022 1135    Physical Exam:   VS:  BP 112/74 (BP Location: Left Arm, Patient Position: Sitting, Cuff Size: Normal)   Pulse 83   Ht 6\' 2"  (1.88 m)   Wt 265 lb 3.2 oz (120.3 kg)   SpO2 97%   BMI 34.05 kg/m    Wt Readings from Last 3 Encounters:  07/10/22 265 lb 3.2 oz (120.3 kg)  04/29/22 270 lb (122.5 kg)  04/27/22 276 lb (125.2 kg)    General: Well nourished, well developed, in no acute distress Head: Atraumatic, normal size  Eyes: PEERLA, EOMI  Neck: Supple, no JVD Endocrine: No thryomegaly Cardiac: Normal S1, S2; RRR; no murmurs, rubs, or gallops Lungs: Clear to auscultation bilaterally, no wheezing, rhonchi or rales  Abd: Soft, nontender, no hepatomegaly  Ext: No edema, pulses 2+ Musculoskeletal: No deformities, BUE and BLE strength normal and equal Skin: Warm and dry, no rashes   Neuro: Alert and oriented to person, place, time, and situation, CNII-XII grossly intact, no focal deficits  Psych: Normal mood and affect   ASSESSMENT:   Micheal Bell is a 52 y.o. male who presents for the following: 1. Coronary artery disease of native artery of native heart with stable angina pectoris (HCC)   2. Essential hypertension   3. Hyperlipidemia LDL goal <70   4. Chronic systolic heart failure (HCC)     PLAN:   1. Coronary artery disease of native artery of native heart with stable angina pectoris (HCC) 2. Essential hypertension 3. Hyperlipidemia LDL goal <70 -Status post PCI to the LAD and left circumflex.  Has a distal RCA lesion that is unchanged.  Left heart catheterization in September really with unchanged anatomy.  We will continue with aggressive medical therapy.  On aspirin 81 mg daily.  Lipids are at goal.   He will continue Crestor 20 mg daily.  He is on SGLT2 inhibitor.  He is on Imdur.  He is on a beta-blocker.  Will continue with medical therapy.  4. Chronic systolic heart failure (HCC) -Ejection fraction has improved to 60%.  On appropriate medical therapy.  Euvolemic on examination.  Continue with Lasix 40 mg daily.  On Jardiance 10 mg daily, carvedilol 12.5 mg twice daily, Entresto 24-26 mg twice daily, Aldactone 25 mg daily.  We discussed to work on his diabetes and continue with low-salt diet.  Overall doing well.  Disposition: Return in about 1 year (around 07/10/2023).  Medication Adjustments/Labs and Tests Ordered: Current medicines are reviewed at length with the patient today.  Concerns regarding medicines are outlined above.  No orders of the defined types were placed in this encounter.  No orders of the defined types were placed in this encounter.   Patient Instructions  Medication Instructions:  The current medical regimen is effective;  continue present plan and medications.  *If you need a refill on your cardiac medications before your next appointment, please call your pharmacy*   Follow-Up: At [facility], you and your health needs are our priority.  As part of our continuing mission to provide you with exceptional heart care, we have created designated Provider Care Teams.  These Care Teams include your primary Cardiologist (physician) and Advanced Practice Providers (APPs -  Physician Assistants and Nurse Practitioners) who all work together to provide you with the  care you need, when you need it.  We recommend signing up for the patient portal called "MyChart".  Sign up information is provided on this After Visit Summary.  MyChart is used to connect with patients for Virtual Visits (Telemedicine).  Patients are able to view lab/test results, encounter notes, upcoming appointments, etc.  Non-urgent messages can be sent to your provider as well.   To learn more  about what you can do with MyChart, go to [email or web address].    Your next appointment:   12 month(s)  Provider:   [name], MD        Time Spent with Patient: I have spent a total of 25 minutes with patient reviewing hospital notes, telemetry, EKGs, labs and examining the patient as well as establishing an assessment and plan that was discussed with the patient.  > 50% of time was spent in direct patient care.  Signed, Lenna Gilford. Flora Lipps, MD, [facility]  [facility]  [address], [address] [place], [place] [address] [phone]  07/10/2022 8:40 AM

## 2022-07-10 ENCOUNTER — Encounter: Payer: Self-pay | Admitting: Cardiovascular Disease

## 2022-07-10 ENCOUNTER — Ambulatory Visit: Payer: Medicaid Other | Attending: Cardiovascular Disease | Admitting: Cardiovascular Disease

## 2022-07-10 VITALS — BP 112/74 | HR 83 | Ht 74.0 in | Wt 265.2 lb

## 2022-07-10 DIAGNOSIS — I5022 Chronic systolic (congestive) heart failure: Secondary | ICD-10-CM

## 2022-07-10 DIAGNOSIS — I25118 Atherosclerotic heart disease of native coronary artery with other forms of angina pectoris: Secondary | ICD-10-CM | POA: Diagnosis not present

## 2022-07-10 DIAGNOSIS — I10 Essential (primary) hypertension: Secondary | ICD-10-CM | POA: Diagnosis not present

## 2022-07-10 DIAGNOSIS — E785 Hyperlipidemia, unspecified: Secondary | ICD-10-CM

## 2022-07-10 NOTE — Patient Instructions (Signed)
Medication Instructions:  The current medical regimen is effective;  continue present plan and medications.  *If you need a refill on your cardiac medications before your next appointment, please call your pharmacy*   Follow-Up: At [facility], you and your health needs are our priority.  As part of our continuing mission to provide you with exceptional heart care, we have created designated Provider Care Teams.  These Care Teams include your primary Cardiologist (physician) and Advanced Practice Providers (APPs -  Physician Assistants and Nurse Practitioners) who all work together to provide you with the care you need, when you need it.  We recommend signing up for the patient portal called "MyChart".  Sign up information is provided on this After Visit Summary.  MyChart is used to connect with patients for Virtual Visits (Telemedicine).  Patients are able to view lab/test results, encounter notes, upcoming appointments, etc.  Non-urgent messages can be sent to your provider as well.   To learn more about what you can do with MyChart, go to [email or web address].    Your next appointment:   12 month(s)  Provider:   [place] T O'Neal, MD   

## 2022-07-14 ENCOUNTER — Other Ambulatory Visit: Payer: Self-pay | Admitting: Podiatry

## 2022-08-05 ENCOUNTER — Other Ambulatory Visit: Payer: Self-pay | Admitting: Nurse Practitioner

## 2022-08-05 ENCOUNTER — Other Ambulatory Visit: Payer: Self-pay | Admitting: Cardiovascular Disease

## 2022-08-05 MED ORDER — ISOSORBIDE MONONITRATE ER 30 MG PO TB24: 30.0000 mg | ORAL_TABLET | Freq: Every day | ORAL | 3 refills | Status: DC

## 2022-08-06 ENCOUNTER — Ambulatory Visit (INDEPENDENT_AMBULATORY_CARE_PROVIDER_SITE_OTHER): Payer: Medicaid Other | Admitting: Podiatry

## 2022-08-06 DIAGNOSIS — M79674 Pain in right toe(s): Secondary | ICD-10-CM | POA: Diagnosis not present

## 2022-08-06 DIAGNOSIS — B351 Tinea unguium: Secondary | ICD-10-CM | POA: Diagnosis not present

## 2022-08-06 DIAGNOSIS — L84 Corns and callosities: Secondary | ICD-10-CM

## 2022-08-06 DIAGNOSIS — E1142 Type 2 diabetes mellitus with diabetic polyneuropathy: Secondary | ICD-10-CM | POA: Diagnosis not present

## 2022-08-06 DIAGNOSIS — M79675 Pain in left toe(s): Secondary | ICD-10-CM

## 2022-08-06 NOTE — Progress Notes (Signed)
  Subjective:  Patient ID: Micheal Bell, male    DOB: [birth year],  MRN: [id number]  Chief Complaint  Patient presents with   Diabetes    Patient came in today for diabetic foot care, nail trim, left foot ulcer, patient denies any pain, A1c- 7.2  BG- 120    52 y.o. male returns with the above complaint. History confirmed with patient.  He is doing well the nails become thickened elongated again, the calluses built up under the fifth metatarsal on the left foot as well. Objective:  Physical Exam: warm, good capillary refill, normal DP and PT pulses, reduced sensation grossly throughout the left lower extremity, onychomycosis x10 with yellow and elongated thickened nail plates, worst of which is the right hallux, incurvated nail borders with blisters around the hallux nail medial lateral borders, no signs of infection.  He has a C-shaped foot with metatarsus adductus and pes cavus deformity Left Foot: Hyperkeratosis submetatarsal 5   Assessment:   1. Pain due to onychomycosis of toenails of both feet   2. Type 2 diabetes mellitus with diabetic polyneuropathy, without long-term current use of insulin (HCC)   3. Pre-ulcerative calluses            Plan:  Patient was evaluated and treated and all questions answered.    Ulcer left foot -Ulcer remains healed.  Had overlying hyperkeratosis.  The hyperkeratosis was reduced in thickness with a sharp scalpel to a tolerable level.  This is medically necessary to prevent risk of Rie ulceration.  He will continue to monitor the site for recurrence  Discussed the etiology and treatment options for the condition in detail with the patient.  Prior debridements have been helpful. Recommended debridement of the nails today. Sharp and mechanical debridement performed of all painful and mycotic nails today. Nails debrided in length and thickness using a nail nipper to level of comfort. Discussed treatment options including appropriate shoe gear.  Follow up as needed for painful nails.  Advised on Epsom salt soaks to reduce the blistering around the bilateral hallux nail borders    Return in about 3 months (around 11/06/2022) for at risk diabetic foot care.

## 2022-11-04 ENCOUNTER — Other Ambulatory Visit: Payer: Self-pay | Admitting: Nurse Practitioner

## 2022-11-04 ENCOUNTER — Other Ambulatory Visit: Payer: Self-pay | Admitting: Cardiovascular Disease

## 2022-11-12 ENCOUNTER — Encounter: Payer: Self-pay | Admitting: Podiatry

## 2022-11-12 ENCOUNTER — Ambulatory Visit (INDEPENDENT_AMBULATORY_CARE_PROVIDER_SITE_OTHER): Payer: Medicaid Other | Admitting: Podiatry

## 2022-11-12 VITALS — BP 122/62 | HR 65 | Temp 98.6°F | Resp 18 | Ht 74.0 in | Wt 260.0 lb

## 2022-11-12 DIAGNOSIS — E1142 Type 2 diabetes mellitus with diabetic polyneuropathy: Secondary | ICD-10-CM | POA: Diagnosis not present

## 2022-11-12 DIAGNOSIS — M79674 Pain in right toe(s): Secondary | ICD-10-CM

## 2022-11-12 DIAGNOSIS — B351 Tinea unguium: Secondary | ICD-10-CM

## 2022-11-12 DIAGNOSIS — M79675 Pain in left toe(s): Secondary | ICD-10-CM | POA: Diagnosis not present

## 2022-11-12 NOTE — Progress Notes (Signed)
Subjective:  Patient ID: Micheal Bell, male    DOB: [birth year],  MRN: [id number]  Chief Complaint  Patient presents with   Diabetes    Patient is here for routine foot care    52 y.o. male returns with the above complaint. History confirmed with patient.  He is doing well the nails become thickened elongated again, callus has not built up, his blood sugar remains well-controlled with Ozempic and his spinal cord stimulator is helping  Objective:  Physical Exam: warm, good capillary refill, normal DP and PT pulses, reduced sensation grossly throughout the left lower extremity, onychomycosis x10 with yellow and elongated thickened nail plates, worst of which is the right hallux, incurvated nail borders with blisters around the hallux nail medial lateral borders, no signs of infection.  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 Pain due to onychomycosis of toenails of both feet   2. Type 2 diabetes mellitus with diabetic polyneuropathy, without long-term current use of insulin (HCC)             Plan:  Patient was evaluated and treated and all questions answered.   Remains ulcer free continue daily surveillance and notify me if he develops any new ulceration   Discussed the etiology and treatment options for the condition in detail with the patient.  Prior debridements have been helpful. Recommended debridement of the nails today. Sharp and mechanical debridement performed of all painful and mycotic nails today. Nails debrided in length and thickness using a nail nipper to level of comfort. Discussed treatment options including appropriate shoe gear. Follow up as needed for painful nails.  Advised on Epsom salt soaks to reduce the blistering around the bilateral hallux nail borders  Follow-up 3 months  No follow-ups on file.

## 2023-02-11 ENCOUNTER — Other Ambulatory Visit: Payer: Self-pay | Admitting: Podiatry

## 2023-02-11 ENCOUNTER — Ambulatory Visit (INDEPENDENT_AMBULATORY_CARE_PROVIDER_SITE_OTHER): Payer: Medicaid Other | Admitting: Podiatry

## 2023-02-11 DIAGNOSIS — M79675 Pain in left toe(s): Secondary | ICD-10-CM | POA: Diagnosis not present

## 2023-02-11 DIAGNOSIS — M79674 Pain in right toe(s): Secondary | ICD-10-CM | POA: Diagnosis not present

## 2023-02-11 DIAGNOSIS — E1142 Type 2 diabetes mellitus with diabetic polyneuropathy: Secondary | ICD-10-CM

## 2023-02-11 DIAGNOSIS — B351 Tinea unguium: Secondary | ICD-10-CM

## 2023-02-11 DIAGNOSIS — S90212A Contusion of left great toe with damage to nail, initial encounter: Secondary | ICD-10-CM

## 2023-02-11 MED ORDER — CEPHALEXIN 500 MG PO CAPS: 500.0000 mg | ORAL_CAPSULE | Freq: Three times a day (TID) | ORAL | 0 refills | Status: DC

## 2023-02-11 NOTE — Patient Instructions (Signed)
Soak Instructions ° ° ° °THE DAY AFTER THE PROCEDURE ° °Place 1/4 cup of epsom salts (or betadine, or white vinegar) in a quart of warm tap water.  Submerge your foot or feet with outer bandage intact for the initial soak; this will allow the bandage to become moist and wet for easy lift off.  Once you remove your bandage, continue to soak in the solution for 20 minutes.  This soak should be done twice a day.  Next, remove your foot or feet from solution, blot dry the affected area and cover.  You may use a band aid large enough to cover the area or use gauze and tape.  Apply other medications to the area as directed by the doctor such as polysporin neosporin. ° °IF YOUR SKIN BECOMES IRRITATED WHILE USING THESE INSTRUCTIONS, IT IS OKAY TO SWITCH TO  WHITE VINEGAR AND WATER. Or you may use antibacterial soap and water to keep the toe clean ° °Monitor for any signs/symptoms of infection. Call the office immediately if any occur or go directly to the emergency room. Call with any questions/concerns. ° ° °

## 2023-02-11 NOTE — Progress Notes (Signed)
  Subjective:  Patient ID: Micheal Bell, male    DOB: [birth year],  MRN: [id number]  Chief Complaint  Patient presents with   [facility]    He is here for [facility] and nail trim. He reports he dropped  a dresser on his left big toe recently and has noticed the skin coming off around the base of the toe and is painful.     52 y.o. male returns with the above complaint. History confirmed with patient.  Nails are painful again.  He damaged the left great toenail as noted above  Objective:  Physical Exam: warm, good capillary refill, normal DP and PT pulses, reduced sensation grossly throughout the left lower extremity, onychomycosis x10 with yellow and elongated thickened nail plates, worst of which is the right hallux, left hallux has erythema and ingrown with onycholysis of nail plate, no ecchymosis or pain to indicate distal phalanx fracture.  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  No active ulceration    Assessment:   1. Pain due to onychomycosis of toenails of both feet   2. Contusion of left great toe with damage to nail, initial encounter              Plan:  Patient was evaluated and treated and all questions answered.   Had a new contusion of the left hallux nail plate today there was no clinical evidence to indicate fractures we did not take x-rays.  I did recommend treating with antibiotics to prevent infection and recommended avulsion of the nail plate.  Following consent and a digital block with lidocaine and Marcaine the left hallux was repaired with Betadine a tourniquet secured around the base of the toe and a Therapist, nutritional used to avulsed the nail plate.  It was irrigated and dressed with a sterile bandage.  Post care instructions given I recommended soaking and applying Neosporin with a Band-Aid for the next 2 weeks and then leave open to air if it has any signs of slow healing or infection he should return to see me before his next  appointment.  Rx Keflex sent to pharmacy as well.  Discussed the etiology and treatment options for the condition in detail with the patient.  Prior debridements have been helpful. Recommended debridement of the nails today. Sharp and mechanical debridement performed of all painful and mycotic nails today. Nails debrided in length and thickness using a nail nipper to level of comfort. Discussed treatment options including appropriate shoe gear. Follow up as needed for painful nails.  Advised on Epsom salt soaks to reduce the blistering around the bilateral hallux nail borders   No follow-ups on file.

## 2023-05-06 ENCOUNTER — Other Ambulatory Visit: Payer: Self-pay

## 2023-05-06 ENCOUNTER — Emergency Department (HOSPITAL_BASED_OUTPATIENT_CLINIC_OR_DEPARTMENT_OTHER)
Admission: EM | Admit: 2023-05-06 | Discharge: 2023-05-06 | Disposition: A | Discharge status: Home / Self Care | Attending: Emergency Medicine | Admitting: Emergency Medicine

## 2023-05-06 ENCOUNTER — Encounter (HOSPITAL_BASED_OUTPATIENT_CLINIC_OR_DEPARTMENT_OTHER): Payer: Self-pay | Admitting: Emergency Medicine

## 2023-05-06 DIAGNOSIS — S61451A Open bite of right hand, initial encounter: Secondary | ICD-10-CM | POA: Diagnosis present

## 2023-05-06 DIAGNOSIS — Z7982 Long term (current) use of aspirin: Secondary | ICD-10-CM | POA: Insufficient documentation

## 2023-05-06 DIAGNOSIS — Z794 Long term (current) use of insulin: Secondary | ICD-10-CM | POA: Insufficient documentation

## 2023-05-06 DIAGNOSIS — I509 Heart failure, unspecified: Secondary | ICD-10-CM | POA: Insufficient documentation

## 2023-05-06 DIAGNOSIS — I110 Hypertensive heart disease with heart failure: Secondary | ICD-10-CM | POA: Insufficient documentation

## 2023-05-06 DIAGNOSIS — W5501XA Bitten by cat, initial encounter: Secondary | ICD-10-CM | POA: Insufficient documentation

## 2023-05-06 DIAGNOSIS — S51812A Laceration without foreign body of left forearm, initial encounter: Secondary | ICD-10-CM | POA: Insufficient documentation

## 2023-05-06 DIAGNOSIS — L03114 Cellulitis of left upper limb: Secondary | ICD-10-CM | POA: Insufficient documentation

## 2023-05-06 DIAGNOSIS — Z79899 Other long term (current) drug therapy: Secondary | ICD-10-CM | POA: Insufficient documentation

## 2023-05-06 DIAGNOSIS — E119 Type 2 diabetes mellitus without complications: Secondary | ICD-10-CM | POA: Diagnosis not present

## 2023-05-06 MED ORDER — AMOXICILLIN-POT CLAVULANATE 875-125 MG PO TABS: 1.0000 | ORAL_TABLET | Freq: Two times a day (BID) | ORAL | 0 refills | Status: DC

## 2023-05-06 MED ORDER — AMOXICILLIN-POT CLAVULANATE 875-125 MG PO TABS
1.0000 | ORAL_TABLET | Freq: Once | ORAL | Status: AC
  Administered 2023-05-06: 1 via ORAL
  Filled 2023-05-06: qty 1

## 2023-05-06 NOTE — Discharge Instructions (Signed)
 You have been seen today for your complaint of cat bite. Your discharge medications include Augmentin. This is an antibiotic. You should take it as prescribed. You should take it for the entire duration of the prescription. This may cause an upset stomach. This is normal. You may take this with food. You may also eat yogurt to prevent diarrhea. Follow up with: Your primary care provider next week Please seek immediate medical care if you develop any of the following symptoms: You have a red streak that leads away from your wound. You have non-clear fluid or more blood coming from your wound. There is pus or a bad smell coming from your wound. You have trouble moving your injured area. You have numbness or tingling that spreads beyond your wound. At this time there does not appear to be the presence of an emergent medical condition, however there is always the potential for conditions to change. Please read and follow the below instructions.  Do not take your medicine if  develop an itchy rash, swelling in your mouth or lips, or difficulty breathing; call 911 and seek immediate emergency medical attention if this occurs.  You may review your lab tests and imaging results in their entirety on your MyChart account.  Please discuss all results of fully with your primary care provider and other specialist at your follow-up visit.  Note: Portions of this text may have been transcribed using voice recognition software. Every effort was made to ensure accuracy; however, inadvertent computerized transcription errors may still be present.

## 2023-05-06 NOTE — ED Provider Notes (Signed)
 [facility] EMERGENCY DEPARTMENT AT [facility] Provider Note   CSN: [id number] Arrival date & time: 05/06/23  1034     History  Chief Complaint  Patient presents with   Animal Bite    Micheal Bell is a 53 y.o. male.  With history of IDDM 2, CAD, hypertension, fibromyalgia, CHF, CVA presenting to the ED for evaluation of right hand pain.  He was bit by his cat 2 days ago to the dorsum of the right hand.  He was also scratched on the left forearm. Cat is up-to-date on rabies vaccination.  He cleaned the wounds immediately afterwards with alcohol.  He tried to call his primary care provider but was told that they do not have walk-in appointments so he came to the emergency department.  He reports mild pain to the area.  He denies fevers.   Animal Bite      Home Medications Prior to Admission medications   Medication Sig Start Date End Date Taking? Authorizing Provider  amoxicillin-clavulanate (AUGMENTIN) 875-125 MG tablet Take 1 tablet by mouth every 12 (twelve) hours. 05/06/23  Yes Pleas Carneal, Edsel Petrin, PA-C  ACCU-CHEK GUIDE test strip Use to check blood glucose twice daily 07/23/20   [provider]  Accu-Chek Softclix Lancets lancets 2 (two) times daily. 06/03/20   [provider]  albuterol (PROVENTIL HFA;VENTOLIN HFA) 108 (90 Base) MCG/ACT inhaler Inhale 1-2 puffs into the lungs every 4 (four) hours as needed for wheezing or shortness of breath.    [provider]  albuterol (PROVENTIL) (2.5 MG/3ML) 0.083% nebulizer solution Take 2.5 mg by nebulization 3 (three) times daily as needed for wheezing or shortness of breath.    [provider]  allopurinol (ZYLOPRIM) 300 MG tablet Take 300 mg by mouth daily.    [provider]  Ascorbic Acid (VITAMIN C PO) Take 1 tablet by mouth daily.    [provider]  aspirin EC 81 MG tablet Take 1 tablet (81 mg total) by mouth daily. Swallow whole. 06/12/21   Sande Rives, MD   buPROPion (WELLBUTRIN XL) 150 MG 24 hr tablet Take 150 mg by mouth every morning. 09/08/19   [provider]  carvedilol (COREG) 12.5 MG tablet TAKE ONE TABLET BY MOUTH TWICE DAILY WITH A MEAL 03/26/22   O'Neal, Ronnald Ramp, MD  cephALEXin (KEFLEX) 500 MG capsule Take 1 capsule (500 mg total) by mouth 3 (three) times daily. 02/11/23   McDonald, Rachelle Hora, DPM  ciclopirox (PENLAC) 8 % solution Apply topically at bedtime. Apply over nail and surrounding skin. Apply daily over previous coat. After seven (7) days, may remove with alcohol and continue cycle. Patient taking differently: Apply 1 Application topically at bedtime as needed (nail fungus). Apply over nail and surrounding skin. Apply daily over previous coat. After seven (7) days, may remove with alcohol and continue cycle. 02/18/21   McDonald, Rachelle Hora, DPM  cyclobenzaprine (FLEXERIL) 10 MG tablet Take 0.5 tablets (5 mg total) by mouth 2 (two) times daily as needed for muscle spasms. Patient taking differently: Take 10 mg by mouth 3 (three) times daily as needed for muscle spasms. 11/15/19   Azucena Fallen, MD  empagliflozin (JARDIANCE) 10 MG TABS tablet Take 1 tablet (10 mg total) by mouth daily before breakfast. 06/12/21   O'Neal, Ronnald Ramp, MD  ENTRESTO 24-26 MG TAKE ONE TABLET BY MOUTH TWICE DAILY 06/15/22   O'Neal, Ronnald Ramp, MD  fluticasone [name] Hights) 50 MCG/ACT nasal spray Place 1 spray into both  nostrils daily as needed for allergies.    [provider]  furosemide (LASIX) 20 MG tablet Lasix 20 mg daily. May take an additional 20 mg on days of increased swelling and weight gain of 3 lb overnight or 5 lb in 1 week. 02/13/22   Joylene Grapes, NP  gabapentin (NEURONTIN) 300 MG capsule Take 600-1,200 mg by mouth See admin instructions. Take 600 mg by mouth in the morning and afternoon and take 1200 mg at bedtime. 11/23/19   [provider]  HYDROmorphone (DILAUDID) 4 MG tablet Take 1 tablet (4 mg total) by mouth  in the morning, at noon, and at bedtime. 10/17/20   Porterfield, Amber, PA-C  insulin glargine (LANTUS) 100 UNIT/ML Solostar Pen Inject 20 Units into the skin daily. Patient taking differently: Inject 25 Units into the skin at bedtime. 11/15/19   Azucena Fallen, MD  isosorbide mononitrate (IMDUR) 30 MG 24 hr tablet Take 1 tablet (30 mg total) by mouth daily. 08/05/22   O'NealRonnald Ramp, MD  lidocaine (XYLOCAINE) 5 % ointment APPLY TO THE AFFECTED AREA AS NEEDED 02/14/23   Edwin Cap, DPM  meloxicam (MOBIC) 15 MG tablet Take 15 mg by mouth daily. 04/01/21   [provider]  metoprolol tartrate (LOPRESSOR) 50 MG tablet Take 50 mg by mouth 2 (two) times daily. 04/02/21   [provider]  Misc. Devices (RECONSTITUBE) MISC Nebulizer, tubing  J45.30  Fax to [facility] 10/03/20   [provider]  mometasone-formoterol (DULERA) 200-5 MCG/ACT AERO Inhale 1 puff into the lungs daily.    [provider]  montelukast (SINGULAIR) 10 MG tablet Take 10 mg by mouth at bedtime.    [provider]  Multiple Vitamins-Minerals (MULTIVITAMIN WITH MINERALS) tablet Take 1 tablet by mouth daily.    [provider]  mupirocin ointment (BACTROBAN) 2 % Apply 1 Application topically daily as needed (abscess on foot). 07/15/22   McDonald, Rachelle Hora, DPM  Nebulizers (COMP AIR COMPRESSOR NEBULIZER) MISC USE AS DIRECTED INCLUDE TUBING J45.30 10/14/20   [provider]  nitroGLYCERIN (NITROSTAT) 0.4 MG SL tablet Place 1 tablet (0.4 mg total) under the tongue every 5 (five) minutes as needed for chest pain. 11/25/21   O'NealRonnald Ramp, MD  nortriptyline (PAMELOR) 25 MG capsule Take 25 mg by mouth at bedtime.    [provider]  pantoprazole (PROTONIX) 40 MG tablet Take 40 mg by mouth daily.    [provider]  PENTIPS 32G X 4 MM MISC See admin instructions. with insulin 11/15/19   [provider]  potassium chloride SA (KLOR-CON M) 20  MEQ tablet TAKE ONE TABLET BY MOUTH AS NEEDED WITH lasix 11/04/22   O'Neal, Ronnald Ramp, MD  RESTASIS 0.05 % ophthalmic emulsion Place 1 drop into both eyes 2 (two) times daily as needed (dry eyes). 07/23/20   [provider]  rosuvastatin (CRESTOR) 20 MG tablet TAKE ONE TABLET BY MOUTH EVERY DAY 08/05/22   O'Neal, Ronnald Ramp, MD  Semaglutide,0.25 or 0.5MG /DOS, (OZEMPIC, 0.25 OR 0.5 MG/DOSE,) 2 MG/3ML SOPN Inject 0.5 mg into the skin once a week. 03/06/22   [provider]  spironolactone (ALDACTONE) 25 MG tablet Take 1 tablet (25 mg total) by mouth daily. 04/03/22   O'NealRonnald Ramp, MD  triamcinolone (KENALOG) 0.1 % Apply 1 application topically 2 (two) times daily as needed (irritation).    [provider]  zinc gluconate 50 MG tablet Take 50 mg by mouth daily.  [provider]  zolpidem (AMBIEN) 10 MG tablet Take 10 mg by mouth at bedtime.    [provider]      Allergies    Azithromycin and Lodine [etodolac]    Review of Systems   Review of Systems  Skin:  Positive for wound.  All other systems reviewed and are negative.   Physical Exam Updated Vital Signs BP 120/71 (BP Location: Right Arm)   Pulse 72   Temp (!) 97.5 F (36.4 C)   Resp 16   Ht 6\' 2"  (1.88 m)   Wt 108.9 kg   SpO2 97%   BMI 30.81 kg/m  Physical Exam Vitals and nursing note reviewed.  Constitutional:      General: He is not in acute distress.    Appearance: Normal appearance. He is normal weight. He is not ill-appearing.  HENT:     Head: Normocephalic and atraumatic.  Pulmonary:     Effort: Pulmonary effort is normal. No respiratory distress.  Abdominal:     General: Abdomen is flat.  Musculoskeletal:        General: Normal range of motion.     Cervical back: Neck supple.  Skin:    General: Skin is warm and dry.     Comments: 2 tiny puncture wounds to the dorsum of the right hand with moderate surrounding erythema.  No purulent drainage.  No  fluctuance.  No induration.  No streaking.  3 cm superficial abrasion to the left forearm without active bleeding.  No surrounding erythema.  No purulent discharge.  No warmth or erythema.  No induration.  Neurological:     Mental Status: He is alert and oriented to person, place, and time.  Psychiatric:        Mood and Affect: Mood normal.        Behavior: Behavior normal.     ED Results / Procedures / Treatments   Labs (all labs ordered are listed, but only abnormal results are displayed) Labs Reviewed - No data to display  EKG None  Radiology No results found.  Procedures Procedures    Medications Ordered in ED Medications  amoxicillin-clavulanate (AUGMENTIN) 875-125 MG per tablet 1 tablet (has no administration in time range)    ED Course/ Medical Decision Making/ A&P                                 Medical Decision Making This patient presents to the ED for concern of cat bite, this involves an extensive number of treatment options, and is a complaint that carries with it a high risk of complications and morbidity.  The differential diagnosis includes cellulitis, abscess  My initial workup includes wound outline  Additional history obtained from: Nursing notes from this visit. Mother at bedside  Afebrile, hemodynamically stable.  53 year old male presenting to the ED for evaluation of cat bite to the right hand.  This occurred 2 days ago.  There has been some swelling and pain to the hand since the bite.  Cat is up-to-date on vaccinations.  There is also a superficial laceration to the left forearm where the cat scratched him.  Scratch does not appear infected at this time.  The bite site does appear to have cellulitic changes.  No streaking.  No evidence of any abscess formation.  Will be started on Augmentin.  He is encouraged to follow-up with his primary care provider next week for reassessment.  He was given return precautions.  Stable at discharge.  At this time  there does not appear to be any evidence of an acute emergency medical condition and the patient appears stable for discharge with appropriate outpatient follow up. Diagnosis was discussed with patient who verbalizes understanding of care plan and is agreeable to discharge. I have discussed return precautions with patient and mother at bedside who verbalizes understanding. Patient encouraged to follow-up with their PCP within 1 week. All questions answered.  Note: Portions of this report may have been transcribed using voice recognition software. Every effort was made to ensure accuracy; however, inadvertent computerized transcription errors may still be present.        Final Clinical Impression(s) / ED Diagnoses Final diagnoses:  Cat bite, initial encounter  Cellulitis of left hand    Rx / DC Orders ED Discharge Orders          Ordered    amoxicillin-clavulanate (AUGMENTIN) 875-125 MG tablet  Every 12 hours        05/06/23 1108              Michelle Piper, PA-C 05/06/23 1109    Melene Plan, DO 05/06/23 1115

## 2023-05-06 NOTE — ED Triage Notes (Signed)
 Pt via pov from home with cat bite to this right hand. Cat was his and rabies vaccination is current. Pt's right hand is swollen and red. Unknown if it is draining. Pt alert & oriented, nad noted.

## 2023-05-06 NOTE — ED Notes (Signed)
 Dc instructions reviewed with patient. Patient voiced understanding. Dc with belongings.

## 2023-05-11 ENCOUNTER — Other Ambulatory Visit: Payer: Self-pay | Admitting: Cardiovascular Disease

## 2023-05-13 ENCOUNTER — Encounter: Payer: Self-pay | Admitting: Podiatry

## 2023-05-13 ENCOUNTER — Ambulatory Visit (INDEPENDENT_AMBULATORY_CARE_PROVIDER_SITE_OTHER): Payer: Medicaid Other | Admitting: Podiatry

## 2023-05-13 DIAGNOSIS — E1142 Type 2 diabetes mellitus with diabetic polyneuropathy: Secondary | ICD-10-CM

## 2023-05-13 DIAGNOSIS — M79674 Pain in right toe(s): Secondary | ICD-10-CM | POA: Diagnosis not present

## 2023-05-13 DIAGNOSIS — B351 Tinea unguium: Secondary | ICD-10-CM

## 2023-05-13 DIAGNOSIS — M79675 Pain in left toe(s): Secondary | ICD-10-CM

## 2023-05-13 DIAGNOSIS — L84 Corns and callosities: Secondary | ICD-10-CM

## 2023-05-13 NOTE — Progress Notes (Signed)
  Subjective:  Patient ID: Micheal Bell, male    DOB: 10-04-1970,  MRN: [id number]  Chief Complaint  Patient presents with   Diabetes    "Check my feet and trim my toenails."  Saw Dr. Kathreen Cosier 01/25/2023, A1c - 5.6    53 y.o. male returns with the above complaint. History confirmed with patient.  Nails are painful again.  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  No active ulceration.  Preulcerative callus fifth metatarsal base left foot    Assessment:   1. Pain due to onychomycosis of toenails of both feet   2. Pre-ulcerative calluses   3. Type 2 diabetes mellitus with diabetic polyneuropathy, without long-term current use of insulin (HCC)              Plan:  Patient was evaluated and treated and all questions answered.   All symptomatic hyperkeratoses were safely debrided with a sterile #15 blade to patient's level of comfort without incident. We discussed preventative and palliative care of these lesions including supportive and accommodative shoegear  Discussed the etiology and treatment options for the condition in detail with the patient.  Prior debridements have been helpful. Recommended debridement of the nails today. Sharp and mechanical debridement performed of all painful and mycotic nails today. Nails debrided in length and thickness using a nail nipper to level of comfort.    Return in about 3 months (around 08/13/2023) for at risk diabetic foot care.

## 2023-05-17 ENCOUNTER — Other Ambulatory Visit: Payer: Self-pay | Admitting: Cardiovascular Disease

## 2023-05-18 NOTE — Telephone Encounter (Signed)
*  STAT* If patient is at the pharmacy, call can be transferred to refill team.   1. Which medications need to be refilled? (please list name of each medication and dose if known) Carvedilol and Spironolactone   2. Would you like to learn more about the convenience, safety, & potential cost savings by using the [facility]?      3. Are you open to using the [facility] (Type [facility].    4. Which pharmacy/location (including street and city if local pharmacy) is medication to be sent to?[facility] [place], [place]   5. Do they need a 30 day or 90 day supply? 90 days and refills

## 2023-06-01 ENCOUNTER — Other Ambulatory Visit: Payer: Self-pay | Admitting: Cardiovascular Disease

## 2023-07-01 ENCOUNTER — Telehealth: Payer: Self-pay | Admitting: Adult Health

## 2023-07-01 ENCOUNTER — Ambulatory Visit: Admitting: Adult Health

## 2023-07-01 ENCOUNTER — Encounter: Payer: Self-pay | Admitting: Adult Health

## 2023-07-01 VITALS — BP 105/67 | HR 75 | Ht 73.0 in | Wt 233.0 lb

## 2023-07-01 DIAGNOSIS — U071 COVID-19: Secondary | ICD-10-CM

## 2023-07-01 DIAGNOSIS — G629 Polyneuropathy, unspecified: Secondary | ICD-10-CM

## 2023-07-01 DIAGNOSIS — G5621 Lesion of ulnar nerve, right upper limb: Secondary | ICD-10-CM

## 2023-07-01 DIAGNOSIS — I639 Cerebral infarction, unspecified: Secondary | ICD-10-CM | POA: Diagnosis not present

## 2023-07-01 DIAGNOSIS — G4733 Obstructive sleep apnea (adult) (pediatric): Secondary | ICD-10-CM | POA: Diagnosis not present

## 2023-07-01 NOTE — Progress Notes (Signed)
 [facility] [address] [place]. [place] [address] [phone]       OFFICE FOLLOW UP VISIT NOTE  Micheal Bell Date of Birth:  January 22, 1971 Medical Record Number:  [id number]    Primary neurologist: Dr. Janett Medin Reason for visit:   Chief Complaint  Patient presents with   Cerebrovascular Accident    Rm 3 alone Pt is well and stable, here for routine FU. Continued R sided weakness, No new concerns        HPI:   Initial visit 05/01/2020 Dr. Janett Medin :Micheal Bell is a 53 year old Caucasian male with history of diabetes, hypertension, gout, irritable bowel syndrome, fibromyalgia, obesity who is seen today for office consultation visit for his stroke and hand weakness.  History is obtained from the patient and his uncle who is accompanying him as well as review of electronic medical records and I have personally reviewed pertinent imaging films in PACS. Patient initially was admitted on 10/08/2020 for Covid pneumonia to the floor and decompensated on 10/11/2020 requiring intubation.  He self extubated himself on 10/15/2020 but required reintubation.  He subsequently was extubated on 10/20/2020 and a typical therapy for Covid including steroids, twice daily CT neb and remdesivir .  Hospitalization was complicated by MSSA pneumonia and group B Streptococcus bacteremia due to left foot abscess.  MRI of the foot showed left foot abscess.  And systolic heart failure.  TEE was negative for vegetations.  He developed some right-sided weakness and confusion and MRI scan of the brain was obtained on 10/25/2019 which showed a 17 mm focus of restricted diffusion in the left frontoparietal subcortical region which showed ring like enhancement on postcontrast raising suspicion for subacute infarct versus cerebritis.  MR angiogram of the brain on 10/29/2019 showed no large vessel stenosis or occlusion.  EEG showed mild diffuse slowing without definite epileptiform activity.  He was started on Keppra   for seizure prophylaxis which she seems to be tolerating well without side effects.  Patient was not on aspirin  at the time.  Patient states he initially did well and recovered his right upper extremity strength after discharge.  However he was admitted with chest pain on 02/20/2020 and was found to have multivessel coronary artery disease and underwent emergent 3 coronary stents.  Following which she had severe pain in the right elbow with swelling and subsequently right hand weakness.  He is now developed clawing of the right ulnar fingers with inability to extend them with weakness and numbness that.  He did finish home physical and occupational therapy.  Is able to ambulate independently but still has persistent right hand weakness and slight dragging of the right leg.  He has not undergone any outpatient therapies.  He states he has no further seizures.  Update 08/05/2020 Dr. Janett Medin : He returns for follow-up after last visit 3 months ago.  He continues to have significant weakness and wasting of his right hand as well as gait and balance difficulties.  He had EMG nerve conduction study done on 04/24/2020 by Dr. Torrance Freestone confirmed underlying diabetic neuropathy as well as mononeuropathy of the right ulnar nerve which was severe in the right upper extremity but difficult to localize exactly.  There was evidence of mild median neuropathy bilaterally as well.  MRI scan of the brain done at [facility] on 05/15/2020 showed a small chronic left centrum semiovale infarct with small focus of microhemorrhage and mild changes of small vessel disease.  Patient is tolerating aspirin  Plavix  well without bruising or  bleeding.  He states blood pressure is under good control today it is 110/75.  His blood sugars are also under good control.  He still has paresthesias in his feet particularly at night despite taking gabapentin  300 mg 3 times daily.  He has no new complaints.  Still on Keppra  which was started for seizure  prophylaxis in the hospital but has not had any seizures.  Update 02/03/2021 JM: Patient returns for 64-month follow-up visit accompanied by his uncle.  Stable from stroke standpoint without new stroke/TIA symptoms.  Continued gait difficulties with balance impairment stable without worsening.  Continued use of cane without any recent falls. Repeat EEG negative and since discontinued Keppra . Uncle does mention last week patient seemed to be off during the day with increased forgetfulness and gait unsteadiness. Patient does add in saying he remembers not feeling well that day. Reports being seen by pulmonologist that same day but no mention of these concerns during that visit. Denies any speech difficulties, weakness, numbness/tingling, visual changes or headache. Felt back to his normal self the following day.  S/p right shoulder arthroplasty 8/25 by Dr. Deeann Fare - completed therapies. Some elbow pain but this improved after restarting his prior arthritic medication meloxicam. His right hand is still contracted. C/o BLE neuropathy which he reports is being managed by podiatry.  He is currently on multiple medications for pain/neuropathy including gabapentin  1200 mg BID, hydromorphone  4 mg 3 times daily, nortriptyline  25 mg nightly, lidocaine  ointment, and meloxicam.   No further concerns at this time  Update 08/07/2021 JM: Patient returns for 72-month stroke follow-up accompanied by his uncle.  Overall stable without new stroke/TIA symptoms.  Continued gait impairment with imbalance, stable.  He does have occasional bowel incontinence while sleeping, recent colonoscopy which was normal per patient (unable to view).  Continued use of cane, has had a couple falls since last visit when outdoors on uneven surfaces, did not hit head. Goes to they [employer] 3x weekly and does pool therapy. Continued right hand contracture and neuropathy. Following with neurosurgery and looking at undergoing spinal cord  stimulator.  Compliant on aspirin  and Crestor , denies side effects.  Blood pressure today 118/77.  Continues to follow with endo for DM management with prior A1c 6.7 (04/2021).  Routinely follows with PCP and cardiology.  No further concerns at this time     Update 07/01/2023 JM: Patient returns for follow-up visit unaccompanied.  Previously seen almost 2 years ago and advised to follow-up as needed.  Reports he requested visit today for routine follow-up, no new concerns since prior visit.   Still has some issues with his right hand, more so with dexterity issues. Continued muscle wasting of hand, feels this has progressed some. Continued numbness/tingling from elbow to fingertips, unchanged since prior visit. At times can feel cold.   Sometimes can have dizziness (drunk feeling) if gets up too quickly. This is not new. Does resolve quickly after he stands up. Continues to use a cane, no recent falls.   He did undergo permanent spinal cord stimulator in 04/2022 by neurosurgery with improvement of sharp pain in feet. Still numb and tingling in feet, up to shin level, overall unchanged.   Currently being followed by pain management, remains on nortriptyline , gabapentin , and hydromorphone /Dilaudid  all managed by pain clinic at [facility]  No longer on BiPAP as unable to tolerate. Saw pulm 06/2022, dicussed further weight loss and if needed, could consider eval for inspire. He has since lost quite a bit of weight,  BMI currently 30 (previously at pulm visit BMI 35).   Reports compliance on aspirin  and Crestor .  Routinely follows with PCP for stroke risk factor management as well as endocrinology for DM management.  Previously seen by cardiology 06/2022, has follow-up visit scheduled at the end of this month.     ROS:   14 system review of systems is positive for those listed in HPI and all other systems negative    PMH:  Past Medical History:  Diagnosis Date   Anxiety    Arthritis     Asthma    inhaler daily   CHF (congestive heart failure) (HCC)    Coronary artery disease    Depression    Diabetes mellitus without complication (HCC)    DVT (deep venous thrombosis) (HCC)    Dyspnea    Fibromyalgia    Gout    History of kidney stones    Hypertension    IBS (irritable bowel syndrome)    Myocardial infarction (HCC) 09/2019   Pneumonia 09/2019   Sleep apnea    Stroke (HCC) 09/2019   Vein disorder     Social History:  Social History   Socioeconomic History   Marital status: Single    Spouse name: Not on file   Number of children: Not on file   Years of education: Not on file   Highest education level: Not on file  Occupational History   Occupation: disabled  Tobacco Use   Smoking status: Never   Smokeless tobacco: Current    Types: Snuff, Chew  Vaping Use   Vaping status: Never Used  Substance and Sexual Activity   Alcohol use: Never   Drug use: Never   Sexual activity: Not on file  Other Topics Concern   Not on file  Social History Narrative   Lives alone, mom is next door   Right handed   Drinks no caffeine   Social Drivers of Health   Financial Resource Strain: Medium Risk (09/18/2021)   [facility] prior to [facility]/25/2022., [facility] visits prior to 04/25/2022., [facility]   Overall Financial Resource Strain (CARDIA)    Difficulty of Paying Living Expenses: Somewhat hard  Food Insecurity: Low Risk  (06/04/2023)   Received from [facility]   Hunger Vital Sign    Worried About Running Out of Food in the Last Year: Never true    Ran Out of Food in the Last Year: Never true  Transportation Needs: No Transportation Needs (06/04/2023)   Received from [facility]    In the past 12 months, has lack of reliable transportation kept you from medical appointments, meetings, work or from getting things needed for daily living? : No  Physical Activity:  Insufficiently Active (09/18/2021)   [facility] prior to 04/25/2022[facility] visits prior to 04/25/2022., [facility]   Exercise Vital Sign    Days of Exercise per Week: 1 day    Minutes of Exercise per Session: 10 min  Stress: No Stress Concern Present (09/18/2021)   [facility], [facility] visits prior to 04/25/2022., [facility] visits prior to 04/25/2022., [facility]   [employer] of Occupational Health - Occupational Stress Questionnaire    Feeling of Stress : Only a little  Social Connections: Moderately Integrated (09/18/2021)   [facility],  [facility] visits prior to 04/25/2022., [facility] visits prior to 04/25/2022., [facility]   Social Connection and Isolation Panel [NHANES]    Frequency of Communication with Friends and Family: More than three times a week    Frequency of Social Gatherings with Friends and Family: Three times a week    Attends Religious Services: More than 4 times per year    Active Member of Clubs or Organizations: Yes    Attends Banker Meetings: More than 4 times per year    Marital Status: Never married  Intimate Partner Violence: Unknown (05/30/2021)   Received from [facility], [facility]   HITS    Physically Hurt: Not on file    Insult or Talk Down To: Not on file    Threaten Physical Harm: Not on file    Scream or Curse: Not on file    Medications:   Current Outpatient Medications on File Prior to Visit  Medication Sig Dispense Refill   ACCU-CHEK GUIDE test strip Use to check blood glucose twice daily     Accu-Chek Softclix Lancets lancets 2 (two) times daily.     albuterol  (PROVENTIL  HFA;VENTOLIN  HFA) 108 (90 Base) MCG/ACT inhaler Inhale 1-2 puffs into the lungs every 4 (four) hours as needed for wheezing or shortness of  breath.     albuterol  (PROVENTIL ) (2.5 MG/3ML) 0.083% nebulizer solution Take 2.5 mg by nebulization 3 (three) times daily as needed for wheezing or shortness of breath.     allopurinol  (ZYLOPRIM ) 300 MG tablet Take 300 mg by mouth daily.     amoxicillin -clavulanate (AUGMENTIN ) 875-125 MG tablet Take 1 tablet by mouth every 12 (twelve) hours. 14 tablet 0   Ascorbic Acid (VITAMIN C PO) Take 1 tablet by mouth daily.     aspirin  EC 81 MG tablet Take 1 tablet (81 mg total) by mouth daily. Swallow whole. 30 tablet 11   buPROPion  (WELLBUTRIN  XL) 150 MG 24 hr tablet Take 150 mg by mouth every morning.     carvedilol  (COREG ) 12.5 MG tablet TAKE ONE TABLET BY MOUTH TWICE DAILY WITH A MEAL 180 tablet 0   cephALEXin  (KEFLEX ) 500 MG capsule Take 1 capsule (500 mg total) by mouth 3 (three) times daily. 21 capsule 0   ciclopirox  (PENLAC ) 8 % solution Apply topically at bedtime. Apply over nail and surrounding skin. Apply daily over previous coat. After seven (7) days, may remove with alcohol and continue cycle. (Patient taking differently: Apply 1 Application topically at bedtime as needed (nail fungus). Apply over nail and surrounding skin. Apply daily over previous coat. After seven (7) days, may remove with alcohol and continue cycle.) 6.6 mL 2   cyclobenzaprine  (FLEXERIL ) 10 MG tablet Take 0.5 tablets (5 mg total) by mouth 2 (two) times daily as needed for muscle spasms. (Patient taking differently: Take 10 mg by mouth 3 (three) times daily as needed for muscle spasms.) 15 tablet 0   empagliflozin  (JARDIANCE ) 10 MG TABS tablet Take 1 tablet (10 mg total) by mouth daily before breakfast. 90 tablet 3   fluticasone  (FLONASE ) 50 MCG/ACT nasal spray Place 1 spray into both nostrils daily as needed for allergies.     furosemide  (LASIX ) 20 MG tablet Lasix  20 mg daily. May take an additional 20 mg on days of increased swelling and weight gain of 3 lb overnight or 5 lb in 1 week. 180 tablet 3   gabapentin  (NEURONTIN ) 300  MG capsule Take 600-1,200 mg  by mouth See admin instructions. Take 600 mg by mouth in the morning and afternoon and take 1200 mg at bedtime.     HYDROmorphone  (DILAUDID ) 4 MG tablet Take 1 tablet (4 mg total) by mouth in the morning, at noon, and at bedtime. 6 tablet 0   insulin  glargine (LANTUS ) 100 UNIT/ML Solostar Pen Inject 20 Units into the skin daily. (Patient taking differently: Inject 25 Units into the skin at bedtime.) 15 mL 0   isosorbide  mononitrate (IMDUR ) 30 MG 24 hr tablet Take 1 tablet (30 mg total) by mouth daily. 90 tablet 3   lidocaine  (XYLOCAINE ) 5 % ointment APPLY TO THE AFFECTED AREA AS NEEDED 50 g 6   meloxicam (MOBIC) 15 MG tablet Take 15 mg by mouth daily.     metoprolol  tartrate (LOPRESSOR ) 50 MG tablet Take 50 mg by mouth 2 (two) times daily.     Misc. Devices (RECONSTITUBE) MISC Nebulizer, tubing  J45.30  Fax to [employer]     mometasone -formoterol  (DULERA ) 200-5 MCG/ACT AERO Inhale 1 puff into the lungs daily.     montelukast  (SINGULAIR ) 10 MG tablet Take 10 mg by mouth at bedtime.     Multiple Vitamins-Minerals (MULTIVITAMIN WITH MINERALS) tablet Take 1 tablet by mouth daily.     mupirocin  ointment (BACTROBAN ) 2 % Apply 1 Application topically daily as needed (abscess on foot). 30 g 2   Nebulizers (COMP AIR COMPRESSOR NEBULIZER) MISC USE AS DIRECTED INCLUDE TUBING J45.30     nitroGLYCERIN  (NITROSTAT ) 0.4 MG SL tablet Place 1 tablet (0.4 mg total) under the tongue every 5 (five) minutes as needed for chest pain. 25 tablet 2   nortriptyline  (PAMELOR ) 25 MG capsule Take 25 mg by mouth at bedtime.     pantoprazole  (PROTONIX ) 40 MG tablet Take 40 mg by mouth daily.     PENTIPS 32G X 4 MM MISC See admin instructions. with insulin      potassium chloride  SA (KLOR-CON  M) 20 MEQ tablet TAKE ONE TABLET BY MOUTH AS NEEDED WITH lasix  90 tablet 3   RESTASIS  0.05 % ophthalmic emulsion Place 1 drop into both eyes 2 (two) times daily as needed (dry eyes).     rosuvastatin  (CRESTOR )  20 MG tablet TAKE ONE TABLET BY MOUTH EVERY DAY 90 tablet 3   sacubitril -valsartan  (ENTRESTO ) 24-26 MG TAKE ONE TABLET BY MOUTH TWICE DAILY 180 tablet 0   Semaglutide,0.25 or 0.5MG /DOS, (OZEMPIC, 0.25 OR 0.5 MG/DOSE,) 2 MG/3ML SOPN Inject 0.5 mg into the skin once a week.     spironolactone  (ALDACTONE ) 25 MG tablet Take 1 tablet (25 mg total) by mouth daily. 90 tablet 0   triamcinolone (KENALOG) 0.1 % Apply 1 application topically 2 (two) times daily as needed (irritation).     zinc gluconate 50 MG tablet Take 50 mg by mouth daily.     zolpidem  (AMBIEN ) 10 MG tablet Take 10 mg by mouth at bedtime.     Current Facility-Administered Medications on File Prior to Visit  Medication Dose Route Frequency Provider Last Rate Last Admin   regadenoson  (LEXISCAN ) injection SOLN 0.4 mg  0.4 mg Intravenous Once O'Neal, [place] Thomas, MD       sodium chloride  flush (NS) 0.9 % injection 3 mL  3 mL Intravenous Q12H Lasalle Pointer, NP        Allergies:   Allergies  Allergen Reactions   Azithromycin     Blisters in mouth    Lodine [Etodolac] Anaphylaxis      Physical Exam Today's Vitals  07/01/23 0751  BP: 105/67  Pulse: 75  Weight: 233 lb (105.7 kg)  Height: 6\' 1"  (1.854 m)   Body mass index is 30.74 kg/m.   General: well developed, well nourished very pleasant middle-aged mildly obese Caucasian male, seated, in no evident distress Head: head normocephalic and atraumatic.   Neck: supple with no carotid or supraclavicular bruits Cardiovascular: regular rate and rhythm, no murmurs Musculoskeletal: Moderate atrophy of right hand intrinsic muscles Skin:  no rash/petichiae Vascular:  Normal pulses all extremities  Neurologic Exam Mental Status: Awake and fully alert.  Fluent speech and language.  Oriented to place and time. Recent and remote memory intact. Attention span, concentration and fund of knowledge appropriate. Mood and affect appropriate.  Cranial Nerves: Pupils equal, briskly  reactive to light. Extraocular movements full without nystagmus. Visual fields full to confrontation. Hearing intact. Facial sensation intact. Face, tongue, palate moves normally and symmetrically.  Motor: Diminished bulk in the right hand intrinsic muscles lumbricals and interossei with wasting.  Clawing of the ulnar 3 digits with nonfixed contractures.  Weakness of right grip and intrinsic hand muscles.  Normal strength in other extremities Sensory.:  Diminished touch pinprick sensation in the right hand in the ulnar nerve mediated dermatome.  Positive Tinel sign over the right ulnar groove.  Decreased sensation BLE up to shin/knee Coordination: Rapid alternating movements normal in all extremities except decreased right hand. Finger-to-nose and heel-to-shin performed accurately bilaterally. Gait and Station: Arises from chair with mild difficulty. Stance is normal. Gait demonstrates slight dragging of the right leg and stiffness with use of cane.  Not able to heel, toe and tandem walk without difficulty.  Reflexes: 1+ and symmetric. Toes downgoing.       ASSESSMENT: 53 year old Caucasian male with Covid infection in 09/2020 complicated by Covid pneumonia, sepsis and left frontal small subcortical infarct etiology septic emboli versus Covid related hypercoagulability.  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 and he also has underlying diabetic chronic polyneuropathy.     PLAN:  Left frontal stroke -Continue on aspirin  and Crestor  for secondary stroke prevention managed/prescribed by PCP -Continue close PCP, cardiology and endocrinology follow-up for aggressive stroke risk factor management to maintain aggressive risk factor management including BP goal<130/90, HLD with LDL goal<70 and DM with A1c.<7 - Stroke labs 05/2023: LDL 39, A1c 5.5  Ulnar neuropathy of elbow -feels some progression of right hand muscle wasting/atrophy, continued difficulty  with fine motor control, continued numbness/tingling -will refer to orthopedics for further evaluation  Chronic polyneuropathy/pain -overall stable -some improvement s/p spinal cord stimulator 2023 -Currently being managed by pain management at [facility] medical -Continued use of cane at all times for fall prevention  Hx of OSA -dx'd 10/2020 by pulmonology - Intolerant to BiPAP therapy -Previously seen by pulmonology 06/2022 and discussed pursuing inspire device vs continued weight loss measures. He continues to lose weight especially since starting Ozempic, BMI today 30 (prior BMI 35 at pulm visit).  He was encouraged to schedule follow-up visit with pulmonology if he starts to have sleep-related concerns but currently denies, discussed possibility of prior moderate OSA improved or even possibly resolved     No further recommendations at this time.  Can return back to PCP and other specialties for ongoing monitoring and management    CC:  Cynda Drafts, FNP   I spent 40 minutes of face-to-face and non-face-to-face time with patient.  This included previsit chart review including review of prior specialty follow-up visits,  lab review, study review, order entry, electronic health record documentation, patient education and discussion regarding above diagnoses and treatment plan and answered all other questions to patient's satisfaction  Johny Nap, [facility]  [facility] [address] [place], [place] [address]  Phone [phone] Fax [phone] Note: This document was prepared with digital dictation and possible smart phrase technology. Any transcriptional errors that result from this process are unintentional.

## 2023-07-01 NOTE — Telephone Encounter (Signed)
Referral for orthopedics fax to [facility]. Phone: [phone], Fax: [phone]

## 2023-07-01 NOTE — Patient Instructions (Addendum)
 Your Plan:  Continue current stroke prevention measures and continue close follow up with PCP, endocrinology and cardiology for stroke risk factor management  Will look into further evaluation for right ulnar neuropathy with orthopedic provider   Congratulations on your weight loss achievements! If you are still have issues with your sleep, would recommend you follow back up with pulmonology to discuss pursing Inspire evaluation  Continue to follow with [facility] medical pain clinic as scheduled     Follow up as needed at this time.      Thank you for coming to see us  at [facility]. I hope we have been able to provide you high quality care today.  You may receive a patient satisfaction survey over the next few weeks. We would appreciate your feedback and comments so that we may continue to improve ourselves and the health of our patients.

## 2023-07-03 NOTE — Progress Notes (Signed)
 Cardiology Office Note:    Date:  07/13/2023   ID:  Micheal Bell, DOB [birth year], MRN [id number]  PCP:  Cynda Drafts, FNP  Cardiologist:  Oneil Bigness, MD     Referring MD: Tura Gaines, MD   Chief Complaint: follow-up of CAD and CHF  History of Present Illness:    Micheal Bell is a 53 y.o. male with a history of CAD s/p DES x3 to LAD and  DES x1 to LCX in 01/2020, chronic HFrEF with EF as low as 30-35% in 09/2019 with subsequent normalization, prior CVA, right lower extremity DVT in 10/2019, hypertension, hyperlipidemia, type 2 diabetes mellitus, neuropathy s/p spinal cord stimulator, fibromyalgia, IBS, and tobacco abuse who is followed by Dr. Rolm Clos and presents today for routine follow-up.   Patient had a prolonged hospitalization in August/ September 2021 for COVID pneumonia with severe hypoxia and ARDS requiring intubation and sepsis secondary to MSSA and Group B strep bacteremia from a left foot abscess. Hospitalization was complicated by severe metabolic encephalopathy due to cerebritis, seizures (treated with Keppra ), and acute right lower extremity DVT (treated with Eliquis ). TTE showed showed LVEF of 30-35%, moderately reduced RV function, and no significant valvular disease. TEE later in admission showed LVEF of 50-55%, normal RV function, and no evidence of endocarditis. 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 At follow-up in October 2021, he reported exertional chest pain. Myoview  was ordered and showed a moderate fixed defect in the inferior wall base to apex with associated hypokinesis concerning for infarction but no reversible ischemia. This led to a LHC in 01/2020 which showed severe 3 vessel disease with severe stenosis of LAD, LCX, and PDA. He was referred to CT Surgery as an outpatient but was felt to be too high risk for CABG. Therefore, he returned to the cath lab later that month and underwent  successful PCI with orbital atherectomy and DES x3 to LAD and DES to LCX. Last Echo in 07/2020 showed LVEF of 55-60% with normal wall motion and grade 1 diastolic dysfunction, normal RV function, and no significant valvular disease. Repeat LHC in 10/2021 for progressive angina showed patent stents to the LAD and LCX and otherwise stable disease. Continued medical therapy was recommended.  He was last seen by Dr. Rolm Clos in 06/2022 at which time he reported improvement in chest pain since starting Imdur  but continued to have some dyspnea on exertion. Overall, he was doing well.  Patient presents today for follow-up. Here with mom. He denies any chest pain and states he has done well on Imdur . He reports dyspnea on exertion especially when going up hill or up an incline. This is not new for him but he does feel like it has been a little worse over the last 1-2 months. No dyspnea at rest or orthopnea. He reports rare PND. He has varicose veins on bilateral legs (left > right) but no edema. HE has some dizziness when he stands too quickly but does okay if he changes positions slowly. No palpitations or syncope. He has been losing weight on Ozempic. Weight is down almost 30 lbs since last year.  EKGs/Labs/Other Studies Reviewed:    The following studies were reviewed:  Echocardiogram 08/06/2020: Impressions: 1. Left ventricular ejection fraction, by estimation, is 55 to 60%. The  left ventricle has normal function. The left ventricle has no regional  wall motion abnormalities. Left ventricular diastolic parameters are  consistent with Grade I  diastolic  dysfunction (impaired relaxation).   2. Right ventricular systolic function is normal. The right ventricular  size is normal. Tricuspid regurgitation signal is inadequate for assessing  PA pressure.   3. The mitral valve is abnormal. Trivial mitral valve regurgitation.   4. The aortic valve is tricuspid. Aortic valve regurgitation is not  visualized.    Comparison(s): Changes from prior study are noted. 10/12/2019: LVEF 30-35%.  _______________  Left Cardiac Catheterization 11/05/2021:   Prox LAD to Mid LAD lesion was previously treated with overlapping DES stent, widely patent.   Dist LAD lesion was previously treated with long DES stent, widely patent..   Prox Cx to Mid Cx lesion was previously treated with DES stent.  Widely patent.   Prox RCA lesion is 40% stenosed.   RPDA lesion is 75% stenosed -> stable from prior catheterization, not likely cause for change in symptoms.   The left ventricular systolic function is normal. The left ventricular ejection fraction is 55-65% by visual estimate.   LV end diastolic pressure is normal.   There is no aortic valve stenosis.   Post-op Diagnosis Widely patent LAD & LCx Stents with minimal RCA disease - stable from post PCI angiography. RPDA lesion seems to be stable if not somewhat improved from last catheterization.  Not likely to be a culprit lesion for new onset/recurrent pain Poor Imaging on LV Gram -but EF appears to be normal with no wall motion normality normal EDP (11 mmHg)  Very left lateral takeoff of the innominate artery makes it very difficult to manipulate catheters, for future cases, would recommend left radial or femoral access.   Diagnostic Dominance: Right      EKG:  EKG ordered today.   EKG Interpretation Date/Time:  Tuesday Jul 13 2023 13:33:11 EDT Ventricular Rate:  77 PR Interval:  204 QRS Duration:  98 QT Interval:  388 QTC Calculation: 439 R Axis:   -25  Text Interpretation: Normal sinus rhythm with 1st degree A-V block Incomplete right bundle branch block Non-specific T wave changes Confirmed by Kattie Santoyo [phone]) on 07/13/2023 1:37:03 PM    Recent Labs: No results found for requested labs within last 365 days.  Recent Lipid Panel    Component Value Date/Time   CHOL 119 02/13/2022 1135   TRIG 107 02/13/2022 1135   HDL 58 02/13/2022 1135    CHOLHDL 2.1 02/13/2022 1135   CHOLHDL 3.9 10/26/2019 0402   VLDL 26 10/26/2019 0402   LDLCALC 42 02/13/2022 1135    Physical Exam:    Vital Signs: BP 100/66 (BP Location: Left Arm, Patient Position: Sitting, Cuff Size: Normal)   Pulse 77   Ht 6\' 2"  (1.88 m)   Wt 232 lb (105.2 kg)   SpO2 100%   BMI 29.79 kg/m     Wt Readings from Last 3 Encounters:  07/13/23 232 lb (105.2 kg)  07/01/23 233 lb (105.7 kg)  05/06/23 240 lb (108.9 kg)     General: 53 y.o. Caucasian male in no acute distress. HEENT: Normocephalic and atraumatic. Sclera clear.  Neck: Supple. No carotid bruits. No JVD. Heart: RRR. Distinct S1 and S2. No murmurs, gallops, or rubs.  Lungs: No increased work of breathing. Clear to ausculation bilaterally. No wheezes, rhonchi, or rales.  Extremities: No lower extremity edema. Varicose veins of bilateral lower extremities (left > right). Skin: Warm and dry. Neuro: No focal deficits. Psych: Normal affect. Responds appropriately.   Assessment:    1. Coronary artery disease involving native coronary artery  of native heart without angina pectoris   2. Chronic HFrEF with Improved EF   3. Primary hypertension   4. Hyperlipidemia, unspecified hyperlipidemia type   5. Type 2 diabetes mellitus with complication, with long-term current use of insulin  (HCC)   6. AKI (acute kidney injury) (HCC)     Plan:    CAD S/p DES x3 to LAD and DES x1 to LCX in 01/2020. Last LHC in 10/2021 showed patent stents in LAD and LCX with some residual PDA disease that was unchanged from prior cath. Continue medical therapy was recommended at that time.  - No chest pain.  - Continue Imdur  30mg  daily and Coreg  12.5mg  twice daily. - Continue aspirin  and statin.  Chronic HFrEF with Improved EF EF as low as 30-35% on TTE during admission in 09/2019 for sepsis and COVID. EF quickly improved to 50-55% on TEE later that admission. Last TTE in 07/2020 showed LVEF of 55-60% with normal wall motion and  grade 1 diastolic dysfunction, normal RV function, and no significant valvular disease.  - He reports some dyspnea on exertion, especially when going up an incline, that has gotten worse over the last couple of months. Euvolemic on exam. He has been losing weight on Ozempic. - Continue Lasix  20mg  daily. Can continue to take an extra 20mg  as needed for weight gain or edema.  - Continue Entresto  24-26mg  twice daily.  - He has both Coreg  12.5mg  twice daily and Lopressor  50mg  twice daily on his medications but confirmed that he is only taking Coreg . Continue this. Will remove Metoprolol  from his medication list. - Will decrease Spironolactone  to 12.5mg  daily due to orthostatic symptoms and recent AKI. - Continue Jardiance  10mg  daily.  - Will repeat Echo given mild increase in dyspnea and the fact that last Echo was 3 years ago.  Hypertension BP soft at 100/66 in the office today. He describes some dizziness with standing consistent with orthostasis.  - Will decrease Spironolactone  to 12.5mg  daily.  - Continue current dose of Entresto  and Coreg  as above.  Hyperlipidemia Lipid panel in 05/2023: Total Cholesterol 106, Triglycerides 137, HDL 45, LDL 39.  - Continue Crestor  20mg  daily.   Type 2 Diabetes Mellitus Hemoglobin A1c 5.5% in 05/2023.  - On Jardiance , Ozempic, and Insulin .  - Management per PCP.   AKI Baseline creatinine around 1.1. However, creatinine 1.59 on recent labs in 05/2023.  - Will repeat BMET today.   Disposition: Follow up in 6 months.   Signed, Casimer Clear, PA-C  07/13/2023 5:30 PM    [facility]

## 2023-07-13 ENCOUNTER — Ambulatory Visit: Attending: Student | Admitting: Student

## 2023-07-13 ENCOUNTER — Encounter: Payer: Self-pay | Admitting: Student

## 2023-07-13 ENCOUNTER — Ambulatory Visit: Payer: Medicaid Other | Admitting: Cardiovascular Disease

## 2023-07-13 ENCOUNTER — Telehealth: Payer: Self-pay | Admitting: Student

## 2023-07-13 VITALS — BP 100/66 | HR 77 | Ht 74.0 in | Wt 232.0 lb

## 2023-07-13 DIAGNOSIS — I2510 Atherosclerotic heart disease of native coronary artery without angina pectoris: Secondary | ICD-10-CM

## 2023-07-13 DIAGNOSIS — I10 Essential (primary) hypertension: Secondary | ICD-10-CM

## 2023-07-13 DIAGNOSIS — I5022 Chronic systolic (congestive) heart failure: Secondary | ICD-10-CM | POA: Diagnosis present

## 2023-07-13 DIAGNOSIS — E785 Hyperlipidemia, unspecified: Secondary | ICD-10-CM | POA: Diagnosis present

## 2023-07-13 DIAGNOSIS — Z794 Long term (current) use of insulin: Secondary | ICD-10-CM

## 2023-07-13 DIAGNOSIS — N179 Acute kidney failure, unspecified: Secondary | ICD-10-CM | POA: Diagnosis present

## 2023-07-13 DIAGNOSIS — E118 Type 2 diabetes mellitus with unspecified complications: Secondary | ICD-10-CM

## 2023-07-13 MED ORDER — SPIRONOLACTONE 25 MG PO TABS: 12.5000 mg | ORAL_TABLET | Freq: Every day | ORAL | 3 refills | Status: AC

## 2023-07-13 NOTE — Telephone Encounter (Signed)
 Pt was suppose to call back and let us  know if he takes Metoprolol   or not. Pt states he does not take it.

## 2023-07-13 NOTE — Patient Instructions (Signed)
 Medication Instructions:  DECREASE SPIRONOLACTONE  12.5MG  DAILY METOPROLOL -CALL AND LET US  KNOW IF YOU ARE TAKING. *If you need a refill on your cardiac medications before your next appointment, please call your pharmacy*  Lab Work: BMET TODAY If you have labs (blood work) drawn today and your tests are completely normal, you will receive your results only by: MyChart Message (if you have MyChart) OR A paper copy in the mail If you have any lab test that is abnormal or we need to change your treatment, we will call you to review the results.  Testing/Procedures: Your physician has requested that you have an echocardiogram. Echocardiography is a painless test that uses sound waves to create images of your heart. It provides your doctor with information about the size and shape of your heart and how well your heart's chambers and valves are working. This procedure takes approximately one hour. There are no restrictions for this procedure. Please do NOT wear cologne, perfume, aftershave, or lotions (deodorant is allowed). Please arrive 15 minutes prior to your appointment time.  Please note: We ask at that you not bring children with you during ultrasound (echo/ vascular) testing. Due to room size and safety concerns, children are not allowed in the ultrasound rooms during exams. Our front office staff cannot provide observation of children in our lobby area while testing is being conducted. An adult accompanying a patient to their appointment will only be allowed in the ultrasound room at the discretion of the ultrasound technician under special circumstances. We apologize for any inconvenience.   Follow-Up: At [facility], you and your health needs are our priority.  As part of our continuing mission to provide you with exceptional heart care, our providers are all part of one team.  This team includes your primary Cardiologist (physician) and Advanced Practice Providers or APPs  (Physician Assistants and Nurse Practitioners) who all work together to provide you with the care you need, when you need it.  Your next appointment:   12 month(s)  Provider:   Oneil Bigness, MD    Other Instructions Precautions to manage orthostatic hypotension focus on slowing down position changes, maintaining hydration, and using supportive measures like compression stockings.

## 2023-07-14 ENCOUNTER — Other Ambulatory Visit: Payer: Self-pay | Admitting: Podiatry

## 2023-07-14 ENCOUNTER — Ambulatory Visit: Payer: Self-pay | Admitting: Student

## 2023-07-14 DIAGNOSIS — E1142 Type 2 diabetes mellitus with diabetic polyneuropathy: Secondary | ICD-10-CM

## 2023-07-14 LAB — BASIC METABOLIC PANEL WITH GFR
BUN/Creatinine Ratio: 16 (ref 9–20)
BUN: 24 mg/dL (ref 6–24)
CO2: 24 mmol/L (ref 20–29)
Calcium: 10 mg/dL (ref 8.7–10.2)
Chloride: 98 mmol/L (ref 96–106)
Creatinine, Ser: 1.46 mg/dL — ABNORMAL HIGH (ref 0.76–1.27)
Glucose: 106 mg/dL — ABNORMAL HIGH (ref 70–99)
Potassium: 4.7 mmol/L (ref 3.5–5.2)
Sodium: 142 mmol/L (ref 134–144)
eGFR: 57 mL/min/{1.73_m2} — ABNORMAL LOW (ref >59)

## 2023-07-14 NOTE — Telephone Encounter (Signed)
 Thank you! I will make sure chart is updated.

## 2023-07-22 ENCOUNTER — Other Ambulatory Visit: Payer: Self-pay | Admitting: *Deleted

## 2023-07-22 DIAGNOSIS — I5022 Chronic systolic (congestive) heart failure: Secondary | ICD-10-CM

## 2023-07-22 DIAGNOSIS — N179 Acute kidney failure, unspecified: Secondary | ICD-10-CM

## 2023-07-23 ENCOUNTER — Other Ambulatory Visit: Payer: Self-pay | Admitting: Cardiovascular Disease

## 2023-08-02 ENCOUNTER — Ambulatory Visit (INDEPENDENT_AMBULATORY_CARE_PROVIDER_SITE_OTHER): Admitting: Podiatry

## 2023-08-02 ENCOUNTER — Ambulatory Visit (INDEPENDENT_AMBULATORY_CARE_PROVIDER_SITE_OTHER)

## 2023-08-02 DIAGNOSIS — E08621 Diabetes mellitus due to underlying condition with foot ulcer: Secondary | ICD-10-CM | POA: Diagnosis not present

## 2023-08-02 DIAGNOSIS — L97522 Non-pressure chronic ulcer of other part of left foot with fat layer exposed: Secondary | ICD-10-CM

## 2023-08-02 DIAGNOSIS — L84 Corns and callosities: Secondary | ICD-10-CM

## 2023-08-02 MED ORDER — AMOXICILLIN-POT CLAVULANATE 875-125 MG PO TABS: 1.0000 | ORAL_TABLET | Freq: Two times a day (BID) | ORAL | 0 refills | Status: AC

## 2023-08-08 NOTE — Progress Notes (Signed)
 Chief Complaint  Patient presents with   Foot Ulcer    Pt stated that on Friday he had a blister that popped up on his foot and they have been treating it with iodine and bandaging it up he stated that the blisters busted     Subjective:  52 y.o. male with PMHx of diabetes mellitus presenting for evaluation of a blister that developed on the patient's foot.  No history of injury.  They have been applying Betadine.  Acute onset.  Lab Results  Component Value Date   HGBA1C 7.2 (H) 04/27/2022   HGBA1C 11.3 (H) 10/26/2019   HGBA1C 11.6 (H) 10/10/2019     Past Medical History:  Diagnosis Date   Anxiety    Arthritis    Asthma    inhaler daily   CHF (congestive heart failure) (HCC)    Coronary artery disease    Depression    Diabetes mellitus without complication (HCC)    DVT (deep venous thrombosis) (HCC)    Dyspnea    Fibromyalgia    Gout    History of kidney stones    Hypertension    IBS (irritable bowel syndrome)    Myocardial infarction (HCC) 09/2019   Pneumonia 09/2019   Sleep apnea    Stroke (HCC) 09/2019   Vein disorder     Past Surgical History:  Procedure Laterality Date   CORONARY ATHERECTOMY N/A 02/20/2020   Procedure: CORONARY ATHERECTOMY;  Surgeon: [name], [name], MD;  Location: [facility] INVASIVE CV LAB;  Service: Cardiovascular;  Laterality: N/A;  CSI LAD   CORONARY STENT INTERVENTION N/A 02/20/2020   Procedure: CORONARY STENT INTERVENTION;  Surgeon: [name], [name], MD;  Location: [facility] INVASIVE CV LAB;  Service: Cardiovascular;  Laterality: N/A;  LAD CFX   CORONARY ULTRASOUND/IVUS N/A 02/20/2020   Procedure: Intravascular Ultrasound/IVUS;  Surgeon: [name], [name], MD;  Location: [facility] INVASIVE CV LAB;  Service: Cardiovascular;  Laterality: N/A;   FOOT SURGERY     LEFT HEART CATH AND CORONARY ANGIOGRAPHY N/A 02/12/2020   Procedure: LEFT HEART CATH AND CORONARY ANGIOGRAPHY;  Surgeon: Arnoldo Lapping, MD;  Location: [facility] INVASIVE CV LAB;  Service: Cardiovascular;   Laterality: N/A;   LEFT HEART CATH AND CORONARY ANGIOGRAPHY N/A 11/05/2021   Procedure: LEFT HEART CATH AND CORONARY ANGIOGRAPHY;  Surgeon: Arleen Lacer, MD;  Location: [facility] INVASIVE CV LAB;  Service: Cardiovascular;  Laterality: N/A;   REVERSE SHOULDER ARTHROPLASTY Right 10/17/2020   Procedure: REVERSE SHOULDER ARTHROPLASTY;  Surgeon: Sammye Cristal, MD;  Location: [facility] ORS;  Service: Orthopedics;  Laterality: Right;   SPINAL CORD STIMULATOR INSERTION N/A 04/29/2022   Procedure: Permanent Spinal cord stimulator placement;  Surgeon: Annis Kinder, MD;  Location: [facility] OR;  Service: Neurosurgery;  Laterality: N/A;  RM 21   TEE WITHOUT CARDIOVERSION N/A 10/24/2019   Procedure: TRANSESOPHAGEAL ECHOCARDIOGRAM (TEE);  Surgeon: Loyde Rule, MD;  Location: [facility] ENDOSCOPY;  Service: Cardiovascular;  Laterality: N/A;    Allergies  Allergen Reactions   Azithromycin     Blisters in mouth    Lodine [Etodolac] Anaphylaxis     LT foot 08/02/2023  LT foot 08/02/2023  Objective/Physical Exam General: The patient is alert and oriented x3 in no acute distress.  Dermatology:  Wound #1 noted to the plantar and lateral aspect of the left foot in total measuring approximately 7.0 x 3.5 x 0.2 cm (LxWxD).   To the noted ulceration(s), there is no eschar. There is a moderate amount of slough, fibrin, and necrotic  tissue noted. Granulation tissue and wound base is red. There is a minimal amount of serosanguineous drainage noted. There is no exposed bone muscle-tendon ligament or joint. There is no malodor. Periwound integrity is intact. Skin is warm, dry and supple bilateral lower extremities.  Vascular: Palpable pedal pulses bilaterally. No edema or erythema noted. Capillary refill within normal limits.  Neurological: Light touch and protective threshold diminished bilaterally.   Musculoskeletal Exam: no prior amputations.  No pedal deformity  Radiographic exam LT foot 08/02/2023: Normal osseous mineralization.   Cortices intact.  No indication of osteomyelitis.  No gas within the tissue  Assessment: 1.  Ulcer plantar aspect of the left foot secondary to diabetes mellitus 2. diabetes mellitus w/ peripheral neuropathy   Plan of Care:  -Patient was evaluated. -Medically necessary excisional debridement including subcutaneous tissue was performed using a tissue nipper and a chisel blade. Excisional debridement of all the necrotic nonviable tissue down to healthy bleeding viable tissue was performed with post-debridement measurements same as pre-. -The wound was cleansed and dry sterile dressing applied. -Advised the patient to go immediately to the emergency department if symptoms continue to worsen -Prescription for Augmentin  875/125 mg -Postop shoe dispensed.  WBAT -Continue Betadine wet-to-dry dressing daily -Return to clinic 1 week   Dot Gazella, DPM [facility]  Dr. Dot Gazella, DPM    [address] [place], [place] [address]                Office [phone]  Fax [phone]

## 2023-08-10 ENCOUNTER — Ambulatory Visit: Payer: Self-pay | Admitting: Student

## 2023-08-10 ENCOUNTER — Ambulatory Visit (INDEPENDENT_AMBULATORY_CARE_PROVIDER_SITE_OTHER): Admitting: Podiatry

## 2023-08-10 ENCOUNTER — Encounter: Payer: Self-pay | Admitting: Podiatry

## 2023-08-10 DIAGNOSIS — L97522 Non-pressure chronic ulcer of other part of left foot with fat layer exposed: Secondary | ICD-10-CM

## 2023-08-10 DIAGNOSIS — B351 Tinea unguium: Secondary | ICD-10-CM | POA: Diagnosis not present

## 2023-08-10 DIAGNOSIS — E08621 Diabetes mellitus due to underlying condition with foot ulcer: Secondary | ICD-10-CM | POA: Diagnosis not present

## 2023-08-10 DIAGNOSIS — M79675 Pain in left toe(s): Secondary | ICD-10-CM

## 2023-08-10 DIAGNOSIS — M79674 Pain in right toe(s): Secondary | ICD-10-CM | POA: Diagnosis not present

## 2023-08-10 LAB — BASIC METABOLIC PANEL WITH GFR
BUN/Creatinine Ratio: 18 (ref 9–20)
BUN: 28 mg/dL — ABNORMAL HIGH (ref 6–24)
CO2: 25 mmol/L (ref 20–29)
Calcium: 9.4 mg/dL (ref 8.7–10.2)
Chloride: 96 mmol/L (ref 96–106)
Creatinine, Ser: 1.56 mg/dL — ABNORMAL HIGH (ref 0.76–1.27)
Glucose: 101 mg/dL — ABNORMAL HIGH (ref 70–99)
Potassium: 5.2 mmol/L (ref 3.5–5.2)
Sodium: 139 mmol/L (ref 134–144)
eGFR: 52 mL/min/{1.73_m2} — ABNORMAL LOW (ref >59)

## 2023-08-11 NOTE — Progress Notes (Signed)
  Subjective:  Patient ID: Micheal Bell, male    DOB: 1970/12/04,  MRN: [id number]  Chief Complaint  Patient presents with   Wound Check    Patient is here for wound check left foot top very moist and peeling bottom healing well, but slow    53 y.o. male presents with the above complaint. History confirmed with patient.  Developed a new ulcer on the outside of the foot and bottom of the left foot, saw Dr. Luster Salters last week for this  Objective:  Physical Exam: warm, good capillary refill and normal DP and PT pulses. Left Foot: dystrophic yellowed discolored nail plates with subungual debris and full-thickness ulceration dorsal lateral midfoot over the fifth metatarsal base and plantar midfoot and the corresponding area has much improved since last exam with Dr. Luster Salters last week Right Foot: dystrophic yellowed discolored nail plates with subungual debris  Assessment:   1. 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 Pain due to onychomycosis of toenails of both feet      Plan:  Patient was evaluated and treated and all questions answered.  Discussed the etiology and treatment options for the condition in detail with the patient.  Recommended debridement of the nails today. Sharp and mechanical debridement performed of all painful and mycotic nails today. Nails debrided in length and thickness using a nail nipper to level of comfort.   Regarding his ulcerations they were cleansed and gently debrided today sharply with a scalpel to remove nonviable tissue and hyperkeratosis.  Hemostasis achieved manually and I recommend using Prisma collagen dressings.  These were applied today.  Already has appointment scheduled with me next week to reevaluate.  Follow-up then    No follow-ups on file.

## 2023-08-19 ENCOUNTER — Ambulatory Visit: Admitting: Podiatry

## 2023-08-19 ENCOUNTER — Other Ambulatory Visit: Payer: Self-pay | Admitting: Cardiovascular Disease

## 2023-08-19 ENCOUNTER — Encounter: Payer: Self-pay | Admitting: Podiatry

## 2023-08-19 VITALS — Ht 74.0 in | Wt 232.0 lb

## 2023-08-19 DIAGNOSIS — B351 Tinea unguium: Secondary | ICD-10-CM

## 2023-08-19 DIAGNOSIS — M79675 Pain in left toe(s): Secondary | ICD-10-CM | POA: Diagnosis not present

## 2023-08-19 DIAGNOSIS — L97522 Non-pressure chronic ulcer of other part of left foot with fat layer exposed: Secondary | ICD-10-CM | POA: Diagnosis not present

## 2023-08-19 DIAGNOSIS — M79674 Pain in right toe(s): Secondary | ICD-10-CM

## 2023-08-19 DIAGNOSIS — E08621 Diabetes mellitus due to underlying condition with foot ulcer: Secondary | ICD-10-CM

## 2023-08-19 NOTE — Progress Notes (Signed)
  Subjective:  Patient ID: Micheal Bell, male    DOB: Mar 09, 1970,  MRN: [id number]  Chief Complaint  Patient presents with   Foot Ulcer    Pt is here to f/u on left foot due to diabetic ulcer, he states the wound is healing well and has no other complaints.    53 y.o. male presents with the above complaint. History confirmed with patient.  He returns for follow-up on his left foot ulceration as well as his diabetic quarterly footcare with debridement of the nails which have become thickened elongated and cause pain in shoe gear.  Objective:  Physical Exam: warm, good capillary refill and normal DP and PT pulses. Left Foot: dystrophic yellowed discolored nail plates with subungual debris, ulceration has fully epithelialized with some peeling skin Right Foot: dystrophic yellowed discolored nail plates with subungual debris  Assessment:   1. Pain due to onychomycosis of toenails of both feet   2. 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  Discussed the etiology and treatment options for the condition in detail with the patient.  Recommended debridement of the nails today. Sharp and mechanical debridement performed of all painful and mycotic nails today. Nails debrided in length and thickness using a nail nipper to level of comfort.   Ulceration has fully healed there is no exposed subcutaneous tissue or skin.  May leave open to air and utilize moisturizing creams lotions, Vaseline, etc. to allow to continue to heal.  Notify me if any of this returns.  Follow-up in 3 months for average diabetic footcare or sooner if there is return of the ulceration    No follow-ups on file.

## 2023-08-20 ENCOUNTER — Ambulatory Visit (HOSPITAL_COMMUNITY)
Admission: RE | Admit: 2023-08-20 | Discharge: 2023-08-20 | Disposition: A | Discharge status: Home / Self Care | Source: Ambulatory Visit | Attending: Cardiology | Admitting: Cardiology

## 2023-08-20 DIAGNOSIS — I5021 Acute systolic (congestive) heart failure: Secondary | ICD-10-CM

## 2023-08-20 DIAGNOSIS — I5022 Chronic systolic (congestive) heart failure: Secondary | ICD-10-CM | POA: Diagnosis present

## 2023-08-20 LAB — ECHOCARDIOGRAM COMPLETE
Area-P 1/2: 3.85 cm2
S' Lateral: 3 cm

## 2023-08-20 MED ORDER — PERFLUTREN LIPID MICROSPHERE
1.0000 mL | INTRAVENOUS | Status: AC | PRN
  Administered 2023-08-20: 2 mL via INTRAVENOUS

## 2023-09-21 ENCOUNTER — Other Ambulatory Visit: Payer: Self-pay | Admitting: Cardiovascular Disease

## 2023-09-27 ENCOUNTER — Other Ambulatory Visit: Payer: Self-pay | Admitting: Cardiovascular Disease

## 2023-09-29 ENCOUNTER — Other Ambulatory Visit: Payer: Self-pay | Admitting: Medical Genetics

## 2023-10-19 ENCOUNTER — Other Ambulatory Visit (HOSPITAL_COMMUNITY)
Admission: RE | Admit: 2023-10-19 | Discharge: 2023-10-19 | Disposition: A | Discharge status: Home / Self Care | Payer: Self-pay | Source: Ambulatory Visit | Attending: Medical Genetics | Admitting: Medical Genetics

## 2023-10-26 LAB — GENECONNECT MOLECULAR SCREEN: Genetic Analysis Overall Interpretation: NEGATIVE

## 2023-10-28 ENCOUNTER — Telehealth: Payer: Self-pay | Admitting: Podiatry

## 2023-10-28 NOTE — Telephone Encounter (Signed)
 Called and left a message on voicemail asking to give us  a call back.

## 2023-10-29 ENCOUNTER — Other Ambulatory Visit: Payer: Self-pay | Admitting: Cardiovascular Disease

## 2023-11-16 ENCOUNTER — Ambulatory Visit: Admitting: Podiatry

## 2023-11-30 ENCOUNTER — Ambulatory Visit (INDEPENDENT_AMBULATORY_CARE_PROVIDER_SITE_OTHER): Admitting: Podiatry

## 2023-11-30 VITALS — Ht 74.0 in | Wt 232.0 lb

## 2023-11-30 DIAGNOSIS — B351 Tinea unguium: Secondary | ICD-10-CM

## 2023-11-30 DIAGNOSIS — E1142 Type 2 diabetes mellitus with diabetic polyneuropathy: Secondary | ICD-10-CM

## 2023-11-30 DIAGNOSIS — L84 Corns and callosities: Secondary | ICD-10-CM

## 2023-11-30 DIAGNOSIS — M79675 Pain in left toe(s): Secondary | ICD-10-CM

## 2023-11-30 DIAGNOSIS — M79674 Pain in right toe(s): Secondary | ICD-10-CM

## 2023-11-30 NOTE — Progress Notes (Signed)
  Subjective:  Patient ID: Micheal Bell, male    DOB: [birth year],  MRN: [id number]  Chief Complaint  Patient presents with   Diabetes    RM 1 DFC    53 y.o. male presents with the above complaint. History confirmed with patient.  He returns for follow-up on his left foot the ulceration remains healed there is some callus there, he is also here for debridement of the nails which have become thickened elongated and cause pain in shoe gear.  Objective:  Physical Exam: warm, good capillary refill and normal DP and PT pulses. Left Foot: dystrophic yellowed discolored nail plates with subungual debris, callus fifth metatarsal base Right Foot: dystrophic yellowed discolored nail plates with subungual debris  Assessment:   1. Pain due to onychomycosis of toenails of both feet   2. Pre-ulcerative calluses   3. Type 2 diabetes mellitus with diabetic polyneuropathy, without long-term current use of insulin  (HCC)      Plan:  Patient was evaluated and treated and all questions answered.  Discussed the etiology and treatment options for the condition in detail with the patient.  Recommended debridement of the nails today. Sharp and mechanical debridement performed of all painful and mycotic nails today. Nails debrided in length and thickness using a nail nipper to level of comfort.   Callus fifth metatarsal base debrided today to prevent reulceration   Return in about 3 months (around 03/01/2024) for at risk diabetic foot care.

## 2024-02-26 ENCOUNTER — Other Ambulatory Visit: Payer: Self-pay

## 2024-02-26 ENCOUNTER — Emergency Department (HOSPITAL_BASED_OUTPATIENT_CLINIC_OR_DEPARTMENT_OTHER)

## 2024-02-26 ENCOUNTER — Encounter (HOSPITAL_BASED_OUTPATIENT_CLINIC_OR_DEPARTMENT_OTHER): Payer: Self-pay

## 2024-02-26 ENCOUNTER — Emergency Department (HOSPITAL_BASED_OUTPATIENT_CLINIC_OR_DEPARTMENT_OTHER)
Admission: EM | Admit: 2024-02-26 | Discharge: 2024-02-26 | Disposition: A | Discharge status: Home / Self Care | Attending: Emergency Medicine | Admitting: Emergency Medicine

## 2024-02-26 DIAGNOSIS — R319 Hematuria, unspecified: Secondary | ICD-10-CM

## 2024-02-26 DIAGNOSIS — Z7982 Long term (current) use of aspirin: Secondary | ICD-10-CM | POA: Diagnosis not present

## 2024-02-26 DIAGNOSIS — Z7984 Long term (current) use of oral hypoglycemic drugs: Secondary | ICD-10-CM | POA: Insufficient documentation

## 2024-02-26 DIAGNOSIS — N3091 Cystitis, unspecified with hematuria: Secondary | ICD-10-CM | POA: Diagnosis not present

## 2024-02-26 DIAGNOSIS — N3090 Cystitis, unspecified without hematuria: Secondary | ICD-10-CM

## 2024-02-26 DIAGNOSIS — Z794 Long term (current) use of insulin: Secondary | ICD-10-CM | POA: Insufficient documentation

## 2024-02-26 DIAGNOSIS — E119 Type 2 diabetes mellitus without complications: Secondary | ICD-10-CM | POA: Diagnosis not present

## 2024-02-26 LAB — COMPREHENSIVE METABOLIC PANEL WITH GFR
ALT: 12 U/L (ref 0–44)
AST: 23 U/L (ref 15–41)
Albumin: 4.7 g/dL (ref 3.5–5.0)
Alkaline Phosphatase: 83 U/L (ref 38–126)
Anion gap: 10 (ref 5–15)
BUN: 13 mg/dL (ref 6–20)
CO2: 28 mmol/L (ref 22–32)
Calcium: 10 mg/dL (ref 8.9–10.3)
Chloride: 100 mmol/L (ref 98–111)
Creatinine, Ser: 1.18 mg/dL (ref 0.61–1.24)
GFR, Estimated: >60 mL/min
Glucose, Bld: 109 mg/dL — ABNORMAL HIGH (ref 70–99)
Potassium: 4.3 mmol/L (ref 3.5–5.1)
Sodium: 139 mmol/L (ref 135–145)
Total Bilirubin: 0.6 mg/dL (ref 0.0–1.2)
Total Protein: 7.3 g/dL (ref 6.5–8.1)

## 2024-02-26 LAB — CBC WITH DIFFERENTIAL/PLATELET
Abs Immature Granulocytes: 0.01 K/uL (ref 0.00–0.07)
Basophils Absolute: 0 K/uL (ref 0.0–0.1)
Basophils Relative: 1 %
Eosinophils Absolute: 0.2 K/uL (ref 0.0–0.5)
Eosinophils Relative: 4 %
HCT: 39.6 % (ref 39.0–52.0)
Hemoglobin: 13.4 g/dL (ref 13.0–17.0)
Immature Granulocytes: 0 %
Lymphocytes Relative: 21 %
Lymphs Abs: 1.4 K/uL (ref 0.7–4.0)
MCH: 31.8 pg (ref 26.0–34.0)
MCHC: 33.8 g/dL (ref 30.0–36.0)
MCV: 93.8 fL (ref 80.0–100.0)
Monocytes Absolute: 0.5 K/uL (ref 0.1–1.0)
Monocytes Relative: 7 %
Neutro Abs: 4.6 K/uL (ref 1.7–7.7)
Neutrophils Relative %: 67 %
Platelets: 163 K/uL (ref 150–400)
RBC: 4.22 MIL/uL (ref 4.22–5.81)
RDW: 12.2 % (ref 11.5–15.5)
WBC: 6.8 K/uL (ref 4.0–10.5)
nRBC: 0 % (ref 0.0–0.2)

## 2024-02-26 LAB — URINALYSIS, ROUTINE W REFLEX MICROSCOPIC
Bacteria, UA: NONE SEEN
Bilirubin Urine: NEGATIVE
Glucose, UA: 250 mg/dL — AB
Ketones, ur: 15 mg/dL — AB
Nitrite: 4 — AB
Protein, ur: 300 mg/dL — AB
RBC / HPF: >50 RBC/hpf (ref 0–5)
Specific Gravity, Urine: 1.01 (ref 1.005–1.030)
WBC, UA: >50 WBC/hpf (ref 0–5)
pH: 7 (ref 5.0–8.0)

## 2024-02-26 MED ORDER — OXYCODONE HCL 5 MG PO TABS
5.0000 mg | ORAL_TABLET | Freq: Once | ORAL | Status: AC
  Administered 2024-02-26: 5 mg via ORAL
  Filled 2024-02-26: qty 1

## 2024-02-26 MED ORDER — CEPHALEXIN 250 MG PO CAPS
500.0000 mg | ORAL_CAPSULE | Freq: Once | ORAL | Status: AC
  Administered 2024-02-26: 500 mg via ORAL
  Filled 2024-02-26: qty 2

## 2024-02-26 MED ORDER — CEPHALEXIN 500 MG PO CAPS: 500.0000 mg | ORAL_CAPSULE | Freq: Three times a day (TID) | ORAL | 0 refills | Status: DC

## 2024-02-26 MED ORDER — CEPHALEXIN 500 MG PO CAPS: 500.0000 mg | ORAL_CAPSULE | Freq: Three times a day (TID) | ORAL | 0 refills | Status: AC

## 2024-02-26 NOTE — ED Notes (Signed)
 ED Provider at bedside.

## 2024-02-26 NOTE — Discharge Instructions (Addendum)
 Take antibiotic as prescribed.  Follow-up with urology and primary care doctor.  Return if symptoms worsen including if you developing urinary retention or fever or any other concerning symptoms.  I suspect you have you have a bladder infection or you possibly passed a recent kidney stone but they might need to do cystoscopy to rule out any other causes of hematuria.  Follow-up with your primary care doctor for reevaluation of your lower lungs.  Does not sound like you are having a lung infection at this time but if you develop fever worsening cough or sputum production consider coming back for evaluation as well.

## 2024-02-26 NOTE — ED Triage Notes (Signed)
 Patient arrives with hematuria. He said he has hx of kidney stones and thinks he passed it and he noted large amounts of blood with clots noted. Says this has happened once before but not this bad.

## 2024-02-26 NOTE — ED Notes (Signed)
 Reviewed discharge instructions, medications, and home care with pt. Pt verbalized understanding and had no further questions. Pt exited ED without complications.

## 2024-02-26 NOTE — ED Provider Notes (Signed)
 " Micheal Bell Provider Note   CSN: [id number] Arrival date & time: 02/26/24  1737     Patient presents with: Hematuria   Micheal Bell is a 54 y.o. male.   Patient here right lower back pain concern for kidney stone.  He is had some blood in his urine.  Feels like pain is gone now.  He felt like he might of passed a kidney stone but he is having some bleeding now.  Denies any fevers or chills.  No cough chest pain shortness of breath weakness numbness tingling.  No nausea vomit diarrhea.  He is feeling much better at this time.  History of diabetes IBS DVT stroke MI.  Denies being on any blood thinners.  The history is provided by the patient.       Prior to Admission medications  Medication Sig Start Date End Date Taking? Authorizing Provider  ACCU-CHEK GUIDE test strip Use to check blood glucose twice daily 07/23/20   Provider, Historical, Bell  Accu-Chek Softclix Lancets lancets 2 (two) times daily. 06/03/20   Provider, Historical, Bell  albuterol  (PROVENTIL  HFA;VENTOLIN  HFA) 108 (90 Base) MCG/ACT inhaler Inhale 1-2 puffs into the lungs every 4 (four) hours as needed for wheezing or shortness of breath.    Provider, Historical, Bell  albuterol  (PROVENTIL ) (2.5 MG/3ML) 0.083% nebulizer solution Take 2.5 mg by nebulization 3 (three) times daily as needed for wheezing or shortness of breath.    Provider, Historical, Bell  allopurinol  (ZYLOPRIM ) 300 MG tablet Take 300 mg by mouth daily.    Provider, Historical, Bell  amoxicillin -clavulanate (AUGMENTIN ) 875-125 MG tablet Take 1 tablet by mouth every 12 (twelve) hours. Patient not taking: Reported on 11/30/2023 08/02/23   Micheal Bell  Ascorbic Acid (VITAMIN C PO) Take 1 tablet by mouth daily.    Provider, Historical, Bell  aspirin  EC 81 MG tablet Take 1 tablet (81 mg total) by mouth daily. Swallow whole. 06/12/21   O'NealDarryle Ned, Bell  buPROPion  (WELLBUTRIN  XL) 150 MG 24 hr tablet Take 150 mg by  mouth every morning. 09/08/19   Provider, Historical, Bell  carvedilol  (COREG ) 12.5 MG tablet TAKE ONE TABLET BY MOUTH TWICE DAILY WITH A MEAL 09/23/23   O'Neal, Darryle Ned, Bell  cephALEXin  (KEFLEX ) 500 MG capsule Take 1 capsule (500 mg total) by mouth 3 (three) times daily for 7 days. 02/26/24 03/04/24  Micheal Bell  ciclopirox  (PENLAC ) 8 % solution Apply topically at bedtime. Apply over nail and surrounding skin. Apply daily over previous coat. After seven (7) days, may remove with alcohol and continue cycle. Patient not taking: Reported on 11/30/2023 02/18/21   Micheal Bell  cyclobenzaprine  (FLEXERIL ) 10 MG tablet Take 0.5 tablets (5 mg total) by mouth 2 (two) times daily as needed for muscle spasms. Patient taking differently: Take 10 mg by mouth 3 (three) times daily as needed for muscle spasms. 11/15/19   Micheal Bell  empagliflozin  (JARDIANCE ) 10 MG TABS tablet Take 1 tablet (10 mg total) by mouth daily before breakfast. 06/12/21   O'Neal, Darryle Ned, Bell  fluticasone  (FLONASE ) 50 MCG/ACT nasal spray Place 1 spray into both nostrils daily as needed for allergies.    Provider, Historical, Bell  furosemide  (LASIX ) 20 MG tablet Lasix  20 mg daily. May take an additional 20 mg on days of increased swelling and weight gain of 3 lb overnight or 5 lb in 1 week. 02/13/22   Micheal Bell  gabapentin  (NEURONTIN ) 300 MG capsule Take 600-1,200 mg by mouth See admin instructions. Take 600 mg by mouth in the morning and afternoon and take 1200 mg at bedtime. 11/23/19   Provider, Historical, Bell  HYDROmorphone  (DILAUDID ) 4 MG tablet Take 1 tablet (4 mg total) by mouth in the morning, at noon, and at bedtime. 10/17/20   Porterfield, Amber, Bell  insulin  glargine (LANTUS ) 100 UNIT/ML Solostar Pen Inject 20 Units into the skin daily. Patient taking differently: Inject 25 Units into the skin at bedtime. 11/15/19   Micheal Bell  isosorbide  mononitrate (IMDUR ) 30 MG 24 hr tablet TAKE  ONE TABLET BY MOUTH EVERY DAY 07/23/23   O'Neal, Darryle Ned, Bell  lidocaine  (XYLOCAINE ) 5 % ointment APPLY TO THE AFFECTED AREA AS NEEDED 07/14/23   Micheal Bell  meloxicam (MOBIC) 15 MG tablet Take 15 mg by mouth daily. 04/01/21   Provider, Historical, Bell  Misc. Devices (RECONSTITUBE) MISC Nebulizer, tubing  J45.30  Fax to [facility] 10/03/20   Provider, Historical, Bell  mometasone -formoterol  (DULERA ) 200-5 MCG/ACT AERO Inhale 1 puff into the lungs daily.    Provider, Historical, Bell  montelukast  (SINGULAIR ) 10 MG tablet Take 10 mg by mouth at bedtime.    Provider, Historical, Bell  Multiple Vitamins-Minerals (MULTIVITAMIN WITH MINERALS) tablet Take 1 tablet by mouth daily.    Provider, Historical, Bell  mupirocin  ointment (BACTROBAN ) 2 % Apply 1 Application topically daily as needed (abscess on foot). 07/15/22   McDonald, Juliene SAUNDERS, Bell  Nebulizers (COMP AIR COMPRESSOR NEBULIZER) MISC USE AS DIRECTED INCLUDE TUBING J45.30 10/14/20   Provider, Historical, Bell  nitroGLYCERIN  (NITROSTAT ) 0.4 MG SL tablet Place 1 tablet (0.4 mg total) under the tongue every 5 (five) minutes as needed for chest pain. 08/19/23   Goodrich, Micheal E, Bell  nortriptyline  (PAMELOR ) 25 MG capsule Take 25 mg by mouth at bedtime.    Provider, Historical, Bell  pantoprazole  (PROTONIX ) 40 MG tablet Take 40 mg by mouth daily.    Provider, Historical, Bell  PENTIPS 32G X 4 MM MISC See admin instructions. with insulin  11/15/19   Provider, Historical, Bell  potassium chloride  SA (KLOR-Micheal  M) 20 MEQ tablet TAKE ONE TABLET BY MOUTH AS NEEDED WITH lasix  10/29/23   O'Neal, Darryle Ned, Bell  RESTASIS  0.05 % ophthalmic emulsion Place 1 drop into both eyes 2 (two) times daily as needed (dry eyes). 07/23/20   Provider, Historical, Bell  rosuvastatin  (CRESTOR ) 20 MG tablet TAKE ONE TABLET BY MOUTH EVERY DAY 07/23/23   O'Neal, Darryle Ned, Bell  sacubitril -valsartan  (ENTRESTO ) 24-26 MG TAKE ONE TABLET BY MOUTH TWICE DAILY 09/30/23   O'Neal, Darryle Ned, Bell   Semaglutide,0.25 or 0.5MG /DOS, (OZEMPIC, 0.25 OR 0.5 MG/DOSE,) 2 MG/3ML SOPN Inject 0.5 mg into the skin once a week. 03/06/22   Provider, Historical, Bell  spironolactone  (ALDACTONE ) 25 MG tablet Take 0.5 tablets (12.5 mg total) by mouth daily. 07/13/23   Goodrich, Micheal E, Bell  triamcinolone (KENALOG) 0.1 % Apply 1 application topically 2 (two) times daily as needed (irritation).    Provider, Historical, Bell  XYOSTED 75 MG/0.5ML SOAJ Inject 75 mg into the skin. 10/11/23   Provider, Historical, Bell  zinc gluconate 50 MG tablet Take 50 mg by mouth daily.    Provider, Historical, Bell  zolpidem  (AMBIEN ) 10 MG tablet Take 10 mg by mouth at bedtime. Patient not taking: Reported on 11/30/2023    Provider, Historical, Bell    Allergies: Azithromycin and Lodine [etodolac]    Review of Systems  Updated Vital Signs BP (!) 140/87 (BP Location: Right Arm)   Pulse (!) 101   Temp 97.9 F (36.6 C) (Oral)   Resp 16   SpO2 100%   Physical Exam Vitals and nursing note reviewed.  Constitutional:      General: He is not in acute distress.    Appearance: He is well-developed. He is not ill-appearing.  HENT:     Head: Normocephalic and atraumatic.     Nose: Nose normal.     Mouth/Throat:     Mouth: Mucous membranes are moist.  Eyes:     Extraocular Movements: Extraocular movements intact.     Conjunctiva/sclera: Conjunctivae normal.     Pupils: Pupils are equal, round, and reactive to light.  Cardiovascular:     Rate and Rhythm: Normal rate and regular rhythm.     Pulses: Normal pulses.     Heart sounds: Normal heart sounds. No murmur heard. Pulmonary:     Effort: Pulmonary effort is normal. No respiratory distress.     Breath sounds: Normal breath sounds.  Abdominal:     General: Abdomen is flat.     Palpations: Abdomen is soft.     Tenderness: There is no abdominal tenderness.  Musculoskeletal:        General: No swelling.     Cervical back: Normal range of motion and neck supple.  Skin:     General: Skin is warm and dry.     Capillary Refill: Capillary refill takes less than 2 seconds.  Neurological:     General: No focal deficit present.     Mental Status: He is alert.  Psychiatric:        Mood and Affect: Mood normal.     (all labs ordered are listed, but only abnormal results are displayed) Labs Reviewed  URINALYSIS, ROUTINE W REFLEX MICROSCOPIC - Abnormal; Notable for the following components:      Result Value   Color, Urine RED (*)    Glucose, UA 250 (*)    Hgb urine dipstick LARGE (*)    Ketones, ur 15 (*)    Protein, ur 300 (*)    Nitrite 4 (*)    Leukocytes,Ua MODERATE (*)    All other components within normal limits  COMPREHENSIVE METABOLIC PANEL WITH GFR - Abnormal; Notable for the following components:   Glucose, Bld 109 (*)    All other components within normal limits  URINE CULTURE  CBC WITH DIFFERENTIAL/PLATELET    EKG: None  Radiology: CT Renal Stone Study Result Date: 02/26/2024 EXAM: CT ABDOMEN AND PELVIS WITHOUT CONTRAST 02/26/2024 06:25:42 PM TECHNIQUE: CT of the abdomen and pelvis was performed without the administration of intravenous contrast. Multiplanar reformatted images are provided for review. Automated exposure control, iterative reconstruction, and/or weight-based adjustment of the mA/kV was utilized to reduce the radiation dose to as low as reasonably achievable. COMPARISON: Abdominal radiograph 10/20/2019 and CT abdomen and pelvis 05/12/2017. CLINICAL HISTORY: Abdominal/flank pain, stone suspected. FINDINGS: LOWER CHEST: Nodular ground glass infiltrates throughout the visualized left lower lobe. These are likely to be multifocal pneumonia or aspiration. Short-term follow-up after resolution of the acute process is recommended to exclude developing ground-glass nodules. LIVER: The liver is unremarkable. GALLBLADDER AND BILE DUCTS: Layering small stones or sludge in the gallbladder. No gallbladder wall thickening or edema. No bile duct  dilatation. SPLEEN: No acute abnormality. PANCREAS: No acute abnormality. ADRENAL GLANDS: No acute abnormality. KIDNEYS, URETERS AND BLADDER: No stones in the kidneys or ureters. No hydronephrosis. No  perinephric or periureteral stranding. Urinary bladder is unremarkable. GI AND BOWEL: The stomach, small bowel, and colon are not abnormally distended. No wall thickening or inflammatory stranding is identified. Appendix is normal. PERITONEUM AND RETROPERITONEUM: No ascites. No free air. VASCULATURE: Aorta is normal in caliber. LYMPH NODES: No significant lymphadenopathy. REPRODUCTIVE ORGANS: The prostate gland is not enlarged. BONES AND SOFT TISSUES: Degenerative changes in the spine. Spinal cord stimulator leads in the thoracic region. No acute osseous abnormality. No focal soft tissue abnormality. IMPRESSION: 1. No urolithiasis or hydronephrosis. 2. Layering small stones or sludge in the gallbladder without gallbladder wall thickening, edema, or bile duct dilatation. 3. Nodular ground glass infiltrates throughout the visualized left lower lobe, likely multifocal pneumonia or aspiration; recommend short-term follow-up chest imaging after resolution of the acute process to exclude developing ground-glass nodules. Electronically signed by: Elsie Gravely Bell 02/26/2024 06:34 PM EST RP Workstation: [id number]     Procedures   Medications Ordered in the ED  cephALEXin  (KEFLEX ) capsule 500 mg (has no administration in time range)  oxyCODONE  (Oxy IR/ROXICODONE ) immediate release tablet 5 mg (5 mg Oral Given 02/26/24 1756)                                    Medical Decision Making Amount and/or Complexity of Data Reviewed Labs: ordered. Radiology: ordered.  Risk Prescription drug management.   SYLVIA KONDRACKI is here with blood in his urine and flank pain concern for kidney stone.  History of DVT stroke MI kidney stones.  Has any fevers or chills.  On my evaluation he is not having any pain anymore.  He  felt like he passed a kidney stone.  He is already had blood work done and CT scan done prior to my evaluation.  Differential diagnosis likely UTI versus passed kidney stone versus some other nonemergent process causing hematuria.  Is not having any urinary retention.  He was just able to fully empty his bladder.  He does notice some blood clots in the urine.  Urinalysis somewhat equivocal for infection.  But will send urine culture and treat with Keflex  but likely there is no fever no white count.  No significant anemia or electrolyte abnormality otherwise.  CT scan showed no evidence of kidney stone.  Does have some stones or sludge in the gallbladder but is not tender in this area.  No concern for hepatobiliary process.  He does have some nonspecific nodular ground glass infiltrates in the lower lungs but overall I Bell not think there is an infectious process given he is not having any symptoms.  He understands to follow-up with his primary care doctor regarding this if this gets worse.  Ultimately we will put him on Keflex  and follow-up with urology and PCP.  Discharged in good condition, understands return precautions.  This chart was dictated using voice recognition software.  Despite best efforts to proofread,  errors can occur which can change the documentation meaning.      Final diagnoses:  Hematuria, unspecified type  Cystitis    ED Discharge Orders          Ordered    cephALEXin  (KEFLEX ) 500 MG capsule  3 times daily        02/26/24 2101               Ruthe Cornet, Bell 02/26/24 2105  "

## 2024-02-29 DIAGNOSIS — E1142 Type 2 diabetes mellitus with diabetic polyneuropathy: Secondary | ICD-10-CM

## 2024-03-02 ENCOUNTER — Ambulatory Visit: Admitting: Podiatry

## 2024-03-02 NOTE — Progress Notes (Signed)
 [facility] [place]  Return Patient Visit Micheal Bell DOB: 04-22-1970  MRN: [id number] Visit Date: 03/02/2024  Encounter Provider: Tinnie Lanell Sharps, FNP  Subjective Micheal Bell is a 54 y.o. male who presents for ER Follow Up   History of Present Illness The patient is a 54 year old male who presents for evaluation of kidney stones, asthma, left hip pain, and ear pain.  He recently had a kidney stone, which caused significant back pain and hematuria. The stone was passed naturally, but it resulted in considerable discomfort during urination. He sought emergency care on 02/26/2024, where he was diagnosed with a urinary tract infection and prescribed Keflex . Currently, he reports no symptoms related to the UTI, although he experiences mild stinging during urination, which is improving. His last episode of hematuria was two days ago. He describes his urine as slightly yellow but clear, with no odor, mucus, or sand-like particles. He has a history of recurrent kidney stones, with the first occurrence at age 52.   He reports no urinary frequency or incontinence but notes intermittent dribbling. His last PSA was 5.02 on 01/10/24. He sees urology and takes Flomax 0.4 mg daily. His next urology appointment is on 03/06/2024.   He has a long-standing history of asthma, diagnosed in childhood, and experiences occasional shortness of breath. He is on Singulair  daily, Dulera  2 puffs twice daily, and albuterol  as needed. He finds relief from coughing and expectorating sputum. He has been experiencing a mild increase in shortness of breath over the past one to two weeks, necessitating more frequent use of his albuterol  inhaler. He used the inhaler yesterday but not today. He reports no recent illnesses, fevers, or body aches. He has a history of smoking as a teenager but currently uses smokeless tobacco. He reports no chest tightness over the past one to two weeks.   He has been  experiencing left hip pain for the past few days, which he attributes to arthritis. The pain is described as stabbing/dull/achey, and can reach a severity of 10/10. He is not currently in any pain sitting. It is worse upon waking up and improves with movement. He reports no recent trauma, injury, falls, or heavy lifting. He has previously received injections for this pain. He has tried physical therapy in the past but is not interested in repeating it. He takes meloxicam 15 mg daily for the pain.   Tobacco: The patient uses smokeless tobacco.   Review of Systems  Constitutional:  Negative for activity change, appetite change, chills, fatigue and fever.  HENT:  Negative for congestion, sinus pressure, sinus pain and sore throat.   Respiratory:  Positive for cough and shortness of breath (mild and intermittent). Negative for chest tightness, wheezing and stridor.   Cardiovascular:  Negative for chest pain and palpitations.  Gastrointestinal:  Negative for diarrhea, nausea and vomiting.  Genitourinary:  Positive for dysuria (mild, improving) and frequency. Negative for decreased urine volume and hematuria.       Endorses dribbling  Musculoskeletal:  Positive for arthralgias (left hip).  Neurological:  Negative for dizziness and weakness.     Objective Blood pressure 103/70, pulse 74, temperature 98.2 F (36.8 C), resp. rate 18, height 1.829 m (6'), weight 110 kg (241 lb 12.8 oz), SpO2 99%. Physical Exam   Physical Exam Constitutional:      Appearance: Normal appearance.  HENT:     Head: Normocephalic and atraumatic.     Right Ear: Tympanic membrane, ear canal and external ear  normal.     Left Ear: Tympanic membrane, ear canal and external ear normal.     Nose: Nose normal.     Right Sinus: No maxillary sinus tenderness or frontal sinus tenderness.     Left Sinus: No maxillary sinus tenderness or frontal sinus tenderness.     Mouth/Throat:     Lips: Pink.     Mouth: Mucous membranes  are moist. No oral lesions.     Pharynx: Oropharynx is clear. Uvula midline. No pharyngeal swelling, oropharyngeal exudate, posterior oropharyngeal erythema or postnasal drip.     Tonsils: No tonsillar exudate or tonsillar abscesses.  Cardiovascular:     Rate and Rhythm: Normal rate and regular rhythm.     Heart sounds: Normal heart sounds, S1 normal and S2 normal.  Pulmonary:     Effort: Pulmonary effort is normal. No tachypnea or accessory muscle usage.     Breath sounds: Normal breath sounds and air entry. No decreased breath sounds, wheezing, rhonchi or rales.  Musculoskeletal:     Left hip: Tenderness (tenderness to palpation) present. No crepitus. Decreased range of motion (mild with flextion and extension).  Lymphadenopathy:     Cervical: No cervical adenopathy.  Skin:    General: Skin is warm.     Capillary Refill: Capillary refill takes less than 2 seconds.  Neurological:     General: No focal deficit present.     Mental Status: He is alert and oriented to person, place, and time. Mental status is at baseline.     Comments: Uses a cane  Psychiatric:        Attention and Perception: Attention and perception normal.        Mood and Affect: Mood and affect normal.        Speech: Speech normal.        Behavior: Behavior normal. Behavior is cooperative.        Thought Content: Thought content normal.     Medical History: Medical History[1]  Patient Active Problem List   Diagnosis Date Noted   History of stress fracture 01/10/2024   Severe obesity (BMI 35.0-35.9 with comorbidity) (CMD) 05/14/2022   Gastroesophageal reflux disease 11/04/2020   Drug-induced constipation 11/04/2020   Opioid type dependence, continuous    (CMD) 10/10/2020   Neuropathic pain 04/15/2020   Status post coronary artery stent placement 03/29/2020   Chronic systolic heart failure    (CMD) 02/12/2020   Chronic pain syndrome 12/01/2019   History of 2019 novel coronavirus disease (COVID-19)  12/01/2019   Chronic insomnia 11/20/2018   Colon cancer screening 11/20/2018   Gout 07/23/2015   Benign hypertension 07/23/2015   Obstructive sleep apnea 07/23/2015   Snoring 07/23/2015   Mild persistent asthma 06/02/2015   Class 1 obesity due to excess calories with serious comorbidity and body mass index (BMI) of 32.0 to 32.9 in adult 06/02/2015   Chronic idiopathic gout of multiple sites 06/02/2015   Irritable bowel syndrome with diarrhea 06/02/2015   Fibromyalgia 06/02/2015   Type 2 diabetes mellitus without complication, without long-term current use of insulin     (CMD) 06/02/2015   Reaction, adjustment, with depressed mood, prolonged 06/02/2015   Current Medications:  Medications Ordered Prior to Encounter[2] Current Medications[3]  Allergies: Allergies[4]   Immunizations:  Immunization History  Administered Date(s) Administered   Dtap, Unspecified 12/27/1968, 02/02/1969, 03/15/1969, 01/19/1972, 09/20/1973   Influenza, Injectable, Quadrivalent, Preservative Free 02/03/2016, 11/26/2016   Influenza, split virus, trivalent, preservative 01/31/2015   Influenza,split virus, trivalent, PF 12/20/2023  Measles 10/09/1969, 04/11/1973   Pfizer SARS-CoV-2 Primary Series 12+ yrs 10/03/2019   Pneumococcal Conjugate Vaccine 20-Valent (PREVNAR-20) 6 wks+ 12/20/2023   Polio, Unspecified 04/23/1969, 08/15/1969, 04/11/1973, 09/20/1973   Rubella 04/11/1973   TD (Adult), 2 Lf Tetanus Toxoid, Preservative Free 01/30/2008   TD vaccine (ADULT)PF(TENIVAC) 7Y+ 01/30/2008   TDAP VACCINE (BOOSTRIX ,ADACEL) 7Y+ 01/30/2008, 09/11/2019   Varicella Zoster [facility]) 18Y+ 12/20/2023, 02/28/2024    Surgical History- Surgical History[5]  Family history- Family History[6] Social history- Social History[7]   Labs: No results found for this or any previous visit (from the past week).     1. Encounter for examination following treatment at hospital (Primary) - Patient  recently seen in the ED for kidney stone and UTI. - Symptoms are improving, with no hematuria in recent days. - He reports a little stinging, which is getting better. - He is advised to maintain adequate hydration, aiming for approximately six bottles of water  daily. - He will continue his antibiotic regimen. If symptoms worsen, such as increased pain or urinary frequency, he should seek medical attention.  2. Left hip pain - He reports pain in the left hip, which started yesterday and can reach a 10/10 in intensity. The pain is described as stabby and catchy, worsening in the morning but improving with movement. - He will continue taking meloxicam 15 mg daily. - A referral to [facility] has been initiated for further evaluation and management. - Ambulatory Referral to Orthopedics; Future  3. Persistent asthma without complication, unspecified asthma severity (CMD) - He reports being more short of breath recently and using his albuterol  inhaler more frequently (approximately once every other day) - Lung sounds are clear with no wheezing, rhonchi, rales, and there is no indication of an asthma exacerbation or infection. Pulse oximeter 99% on room air.  - He should continue to use his Dulera  inhaler twice daily, take his singulair  daily, and use his albuterol  inhaler as needed.  - He is advised to monitor his condition closely and return if symptoms worsen.   Assessment & Plan      Problem List Items Addressed This Visit   None   Monitor: The problem is newly identified Evaluation: No labs/tests required today Assessment/Treatment PRN  Please contact my office for worsening conditions or problems, and seek emergency medical treatment and/or call 911 if you or your family deems either necessary.  Patient Instructions  I have sent a referral in for [facility]. They will be in touch to schedule an appointment for your hip pain.   Continue using your daily maintenance inhaler for  your asthma. Use albuterol  as needed for shortness of breath. If you experience worsening shortness of breath, chest pain/pressure, cough, low oxygen levels, please let us  know.   For your kidney stones/urinary tract infection, continue to take your antibiotics as prescribed and stay well hydrated.    No follow-ups on file.  Portions of this note were created using DAX Copilot software. Although proofread, errors may be present.  I have personally spent 30 minutes involved in face-to-face and non-face-to-face activities for this patient on the day of the visit.  Professional time spent includes the following activities, in addition to those noted in the documentation:  - preparing to see the patient (e.g., review of recent and/or remote lab/imaging/study results, provider notes, and patient messages/phone calls available in current EMR, CareEverywhere, and scanned records) -obtaining and/or reviewing separately obtained history either through past provider notes, patient phone calls, and/or patient's family member(s)/caregiver(s) -performing a  medically appropriate examination and/or evaluation -counseling and educating the patient/family/caregiver -ordering medications, tests, or procedures -documenting clinical information in the electronic or other health record -reviewing most up to date studies or expert consensus guidelines for screening/diagnosing/treating pertinent conditions/symptoms -independently interpreting results (not separately reported) and communicating results to the patient/family/caregiver -care coordination (not separately reported) -referring and communicating with other health care professionals (when not separately reported)  Tinnie Lanell Sharps, FNP 9:57 AM       [1] Past Medical History: Diagnosis Date   Allergic    Anemia    Anxiety    Arthritis    Asthma (CMD)    CHF (congestive heart failure)    (CMD)    Coronary artery disease with  exertional angina 03/29/2020   COVID-19 virus infection    hospitalized one month, coma, MI and stroke   Cutaneous abscess of back excluding buttocks 01/01/2019   Depression    Diabetes mellitus (CMD)    Eczema    Fibromyalgia    Financial difficulties 11/20/2018   GERD (gastroesophageal reflux disease)    Gout    Headache    Hypertension    IBS (irritable bowel syndrome)    Issue of medical certificate for disability examination 11/20/2018   Lower respiratory tract infection due to COVID-19 virus 10/10/2019   Last Assessment & Plan:  Formatting of this note might be different from the original. He has residual COVID symptoms and hopefully will continue to improve with increasing activity.  Continue with supportive care   MI (myocardial infarction)    (CMD)    10/2019   Obesity    OSA on CPAP    Reflux esophagitis 07/23/2015   Seizure disorder    (CMD) 12/01/2019   singel occurrence with Covid   Stented coronary artery    4 stents placed Dec. 2021   Stroke    (CMD)    Sept 2021  [2] Current Outpatient Medications on File Prior to Visit  Medication Sig Dispense Refill   acetaminophen  (TYLENOL ) 500 mg tablet Take 1,000 mg by mouth.     albuterol  2.5 mg /3 mL (0.083 %) nebulizer solution USE 3 mls (2.5MG  total) by NEBULIZER THREE TIMES DAILY 300 mL 1   allopurinoL  (ZYLOPRIM ) 300 mg tablet TAKE ONE TABLET BY MOUTH DAILY TO PREVENT GOUT 90 tablet 1   aspirin  81 mg EC tablet Take 81 mg by mouth Once Daily.     buPROPion  (WELLBUTRIN  XL) 150 mg 24 hr tablet Indications: depression. Take one tablet every morning for depression 90 tablet 3   carvediloL  (COREG ) 12.5 mg tablet Take 12.5 mg by mouth 2 (two) times a day.     cephALEXin  (KEFLEX ) 500 mg capsule Take 500 mg by mouth 3 (three) times a day. for 7 days     ciclopirox  (PENLAC ) 8 % solution Apply 1 Application topically nightly. 6.6 mL 1   cyclobenzaprine  (FLEXERIL ) 10 mg tablet TAKE ONE TABLET BY MOUTH  THREE TIMES DAILY AS NEEDED FOR MUSCLE SPASMS 270 tablet 3   Entresto  24-26 mg per tablet Take 1 tablet by mouth 2 (two) times a day.     eszopiclone (LUNESTA) 2 mg tablet Take 1 tablet at bedtime as needed for insomnia 30 tablet 5   fluticasone  propionate (FLONASE ) 50 mcg/spray nasal spray PLACE ONE SPRAY into EACH nostril DAILY AS NEEDED FOR allergies 16 g 5   furosemide  (LASIX ) 20 mg tablet TAKE ONE TABLET A DAY AS NEEDED FOR RETENTION 90 tablet 0   gabapentin  (  NEURONTIN ) 300 mg capsule TAKE 2 CAPSULES IN THE AM, 2 CAPSULES AT LUNCH, AND TAKE 4 CAPSULES AT BEDTIME FOR NERVE PAIN 240 capsule 3   glucose blood (Accu-Chek Guide test strips) test strip Use as instructed 200 strip 5   HYDROmorphone  (DILAUDID ) 4 mg tablet Take 1 tablet (4 mg total) by mouth 3 (three) times a day as needed for severe pain (7-10). 90 tablet 0   isosorbide  mononitrate (IMDUR ) 30 mg 24 hr tablet Take 30 mg by mouth Once Daily.     Jardiance  10 mg tab Take 1 tablet (10 mg total) by mouth daily. 90 tablet 2   lidocaine  (XYLOCAINE ) 5 % ointment Apply 1 Application topically.     losartan  (COZAAR ) 25 mg tablet Take 12.5 mg by mouth 2 (two) times a day.     meloxicam (MOBIC) 15 mg tablet TAKE ONE TABLET BY MOUTH EVERY DAY WITH A MEAL FOR ARTHRITIS PAIN 90 tablet 0   metoprolol  tartrate (LOPRESSOR ) 50 mg tablet TAKE 1 AND 1/2 TABLETS (75 MG) BY MOUTH TWO TIMES DAILY.  Prescribed by cardiology. 90 tablet 3   mometasone -formoterol  (Dulera ) 200-5 mcg/actuation HFAA inhaler Inhale 2 puffs 2 (two) times a day. 13 g 3   montelukast  (SINGULAIR ) 10 mg tablet TAKE ONE TABLET BY MOUTH EVERY DAY FOR ALLERGIES AND ASTHMA 90 tablet 3   naloxone  (NARCAN ) 4 mg/actuation spry nasal spray Administer 1 spray into affected nostril(s) as needed (In case of accidental opioid overdose). For accidental opioid overdose 1 each 1   nebulizer and compressor (Comp-Air Nebulizer Compressor) devi USE AS DIRECTED INCLUDE TUBING J45.30 1 each  0   nitroglycerin  (NITROSTAT ) 0.4 mg SL tablet Place 0.4 mg under the tongue every 5 (five) minutes as needed.     nortriptyline  (PAMELOR ) 25 mg capsule Take one capsule at bedtime for insomnia and pain 90 capsule 3   pantoprazole  (PROTONIX ) 40 mg EC tablet TAKE ONE TABLET BY MOUTH DAILY TO CONTRL ACID REFLUX SYMPTOMS 90 tablet 3   pen needle, diabetic (Sure Comfort Pen Needle) 32 gauge x 5/32 ndle Use to administer insulin  daily 100 each 3   pen needle, diabetic 31 gauge x 5/16 ndle Use with lantus  solostar 100 each 5   potassium chloride  (KLOR-CON ) 20 mEq ER tablet TAKE ONE TABLET BY MOUTH AS NEEDED WITH lasix      Restasis  0.05 % ophthalmic emulsion Administer 1 drop into each eyes every 12 (twelve) hours.     rosuvastatin  (CRESTOR ) 20 mg tablet Take 20 mg by mouth Once Daily. 90 tablet    semaglutide (Ozempic) 2 mg/dose (8 mg/3 mL) subcutaneous pen injector Inject 0.75 mL (2 mg total) under the skin every 7 days. 9 mL 4   spironolactone  (ALDACTONE ) 25 mg tablet Take 12.5 mg by mouth Once Daily.     tamsulosin (FLOMAX) 0.4 mg cap Take 1 capsule (0.4 mg total) by mouth daily. 90 capsule 1   testosterone enanthate (Xyosted) 75 mg/0.5 mL atIn Inject 0.5 mL (75 mg total) under the skin every 7 days. 2 mL 5   Tobradex  0.3-0.1 % ophthalmic solution instill ONE DROP IN THE RIGHT EYE FOUR TIMES DAILY FOR FOUR DAYS     triamcinolone (KENALOG) 0.1 % cream Apply 1 Application topically.     Ventolin  HFA 90 mcg/actuation inhaler inhale ONE OR TWO PUFFS EVERY FOUR hours AS NEEDED FOR FOR WHEEZING 18 g 3   Lancets misc Use to check BG twice daily 200 each 5   No current facility-administered medications  on file prior to visit.  [3]  Current Outpatient Medications:    acetaminophen  (TYLENOL ) 500 mg tablet, Take 1,000 mg by mouth., Disp: , Rfl:    albuterol  2.5 mg /3 mL (0.083 %) nebulizer solution, USE 3 mls (2.5MG  total) by NEBULIZER THREE TIMES DAILY, Disp: 300 mL, Rfl: 1    allopurinoL  (ZYLOPRIM ) 300 mg tablet, TAKE ONE TABLET BY MOUTH DAILY TO PREVENT GOUT, Disp: 90 tablet, Rfl: 1   aspirin  81 mg EC tablet, Take 81 mg by mouth Once Daily., Disp: , Rfl:    buPROPion  (WELLBUTRIN  XL) 150 mg 24 hr tablet, Indications: depression. Take one tablet every morning for depression, Disp: 90 tablet, Rfl: 3   carvediloL  (COREG ) 12.5 mg tablet, Take 12.5 mg by mouth 2 (two) times a day., Disp: , Rfl:    cephALEXin  (KEFLEX ) 500 mg capsule, Take 500 mg by mouth 3 (three) times a day. for 7 days, Disp: , Rfl:    ciclopirox  (PENLAC ) 8 % solution, Apply 1 Application topically nightly., Disp: 6.6 mL, Rfl: 1   cyclobenzaprine  (FLEXERIL ) 10 mg tablet, TAKE ONE TABLET BY MOUTH THREE TIMES DAILY AS NEEDED FOR MUSCLE SPASMS, Disp: 270 tablet, Rfl: 3   Entresto  24-26 mg per tablet, Take 1 tablet by mouth 2 (two) times a day., Disp: , Rfl:    eszopiclone (LUNESTA) 2 mg tablet, Take 1 tablet at bedtime as needed for insomnia, Disp: 30 tablet, Rfl: 5   fluticasone  propionate (FLONASE ) 50 mcg/spray nasal spray, PLACE ONE SPRAY into EACH nostril DAILY AS NEEDED FOR allergies, Disp: 16 g, Rfl: 5   furosemide  (LASIX ) 20 mg tablet, TAKE ONE TABLET A DAY AS NEEDED FOR RETENTION, Disp: 90 tablet, Rfl: 0   gabapentin  (NEURONTIN ) 300 mg capsule, TAKE 2 CAPSULES IN THE AM, 2 CAPSULES AT LUNCH, AND TAKE 4 CAPSULES AT BEDTIME FOR NERVE PAIN, Disp: 240 capsule, Rfl: 3   glucose blood (Accu-Chek Guide test strips) test strip, Use as instructed, Disp: 200 strip, Rfl: 5   HYDROmorphone  (DILAUDID ) 4 mg tablet, Take 1 tablet (4 mg total) by mouth 3 (three) times a day as needed for severe pain (7-10)., Disp: 90 tablet, Rfl: 0   isosorbide  mononitrate (IMDUR ) 30 mg 24 hr tablet, Take 30 mg by mouth Once Daily., Disp: , Rfl:    Jardiance  10 mg tab, Take 1 tablet (10 mg total) by mouth daily., Disp: 90 tablet, Rfl: 2   lidocaine  (XYLOCAINE ) 5 % ointment, Apply 1 Application topically., Disp: , Rfl:     losartan  (COZAAR ) 25 mg tablet, Take 12.5 mg by mouth 2 (two) times a day., Disp: , Rfl:    meloxicam (MOBIC) 15 mg tablet, TAKE ONE TABLET BY MOUTH EVERY DAY WITH A MEAL FOR ARTHRITIS PAIN, Disp: 90 tablet, Rfl: 0   metoprolol  tartrate (LOPRESSOR ) 50 mg tablet, TAKE 1 AND 1/2 TABLETS (75 MG) BY MOUTH TWO TIMES DAILY.  Prescribed by cardiology., Disp: 90 tablet, Rfl: 3   mometasone -formoterol  (Dulera ) 200-5 mcg/actuation HFAA inhaler, Inhale 2 puffs 2 (two) times a day., Disp: 13 g, Rfl: 3   montelukast  (SINGULAIR ) 10 mg tablet, TAKE ONE TABLET BY MOUTH EVERY DAY FOR ALLERGIES AND ASTHMA, Disp: 90 tablet, Rfl: 3   naloxone  (NARCAN ) 4 mg/actuation spry nasal spray, Administer 1 spray into affected nostril(s) as needed (In case of accidental opioid overdose). For accidental opioid overdose, Disp: 1 each, Rfl: 1   nebulizer and compressor (Comp-Air Nebulizer Compressor) devi, USE AS DIRECTED INCLUDE TUBING J45.30, Disp: 1 each, Rfl:  0   nitroglycerin  (NITROSTAT ) 0.4 mg SL tablet, Place 0.4 mg under the tongue every 5 (five) minutes as needed., Disp: , Rfl:    nortriptyline  (PAMELOR ) 25 mg capsule, Take one capsule at bedtime for insomnia and pain, Disp: 90 capsule, Rfl: 3   pantoprazole  (PROTONIX ) 40 mg EC tablet, TAKE ONE TABLET BY MOUTH DAILY TO CONTRL ACID REFLUX SYMPTOMS, Disp: 90 tablet, Rfl: 3   pen needle, diabetic (Sure Comfort Pen Needle) 32 gauge x 5/32 ndle, Use to administer insulin  daily, Disp: 100 each, Rfl: 3   pen needle, diabetic 31 gauge x 5/16 ndle, Use with lantus  solostar, Disp: 100 each, Rfl: 5   potassium chloride  (KLOR-CON ) 20 mEq ER tablet, TAKE ONE TABLET BY MOUTH AS NEEDED WITH lasix , Disp: , Rfl:    Restasis  0.05 % ophthalmic emulsion, Administer 1 drop into each eyes every 12 (twelve) hours., Disp: , Rfl:    rosuvastatin  (CRESTOR ) 20 mg tablet, Take 20 mg by mouth Once Daily., Disp: 90 tablet, Rfl:    semaglutide (Ozempic) 2 mg/dose (8 mg/3 mL)  subcutaneous pen injector, Inject 0.75 mL (2 mg total) under the skin every 7 days., Disp: 9 mL, Rfl: 4   spironolactone  (ALDACTONE ) 25 mg tablet, Take 12.5 mg by mouth Once Daily., Disp: , Rfl:    tamsulosin (FLOMAX) 0.4 mg cap, Take 1 capsule (0.4 mg total) by mouth daily., Disp: 90 capsule, Rfl: 1   testosterone enanthate (Xyosted) 75 mg/0.5 mL atIn, Inject 0.5 mL (75 mg total) under the skin every 7 days., Disp: 2 mL, Rfl: 5   Tobradex  0.3-0.1 % ophthalmic solution, instill ONE DROP IN THE RIGHT EYE FOUR TIMES DAILY FOR FOUR DAYS, Disp: , Rfl:    triamcinolone (KENALOG) 0.1 % cream, Apply 1 Application topically., Disp: , Rfl:    Ventolin  HFA 90 mcg/actuation inhaler, inhale ONE OR TWO PUFFS EVERY FOUR hours AS NEEDED FOR FOR WHEEZING, Disp: 18 g, Rfl: 3   Lancets misc, Use to check BG twice daily, Disp: 200 each, Rfl: 5 [4] Allergies Allergen Reactions   Azithromycin Other (See Comments)    Blisters in mouth    Codeine    Etodolac Other (See Comments)    Blisters in mouth  [5] Past Surgical History: Procedure Laterality Date   TOTAL SHOULDER ARTHROPLASTY Right    Procedure: TOTAL SHOULDER REPLACEMENT; 09/2020  [6] Family History Problem Relation Name Age of Onset   Hypertension Mother Inocente Sharps    Diabetes Mother Inocente Sharps    Heart attack Mother Inocente Sharps    Coronary artery disease Mother Inocente Sharps    Stroke Mother Inocente Sharps    Asthma Father Miquel    Asthma Sister Verneita    Asthma Brother Caron   [7] Social History Socioeconomic History   Marital status: Single  Tobacco Use   Smoking status: Former    Current packs/day: 0.00    Types: Cigarettes    Quit date: 10/27/2022    Years since quitting: 1.3   Smokeless tobacco: Current    Types: Snuff, Chew   Tobacco comments:    Hard to quit for good  Vaping Use   Vaping status: Never Used  Substance and Sexual Activity   Alcohol use: Never   Drug use: Yes    Types: Hydromorphone     Sexual activity: Never   Social Drivers of Health   Living Situation: Low Risk (03/02/2024)   Living Situation    What is your living situation today?: I have a  steady place to live    Think about the place you live. Do you have problems with any of the following? Choose all that apply:: None/None on this list  Food Insecurity: Low Risk (03/02/2024)   Food vital sign    Within the past 12 months, you worried that your food would run out before you got money to buy more: Never true    Within the past 12 months, the food you bought just didn't last and you didn't have money to get more: Never true  Transportation Needs: Unmet Transportation Needs (03/02/2024)   Transportation    In the past 12 months, has lack of reliable transportation kept you from medical appointments, meetings, work or from getting things needed for daily living? : Yes  Utilities: Low Risk (03/02/2024)   Utilities    In the past 12 months has the electric, gas, oil, or water  company threatened to shut off services in your home? : No  Safety: Low Risk (03/02/2024)   Safety    How often does anyone, including family and friends, physically hurt you?: Never    How often does anyone, including family and friends, insult or talk down to you?: Never    How often does anyone, including family and friends, threaten you with harm?: Never    How often does anyone, including family and friends, scream or curse at you?: Never  Alcohol Screening: Not At Risk (12/20/2023)   Alcohol    Audit C Alcohol risk score: 0  Tobacco Use: High Risk (02/26/2024)   Received from [facility]   Patient History    Smoking Tobacco Use: Never    Smokeless Tobacco Use: Current  Depression: Not At Risk (03/02/2024)   PHQ-2    PHQ-2 Score: 0  Social Connections: Moderately Integrated (09/18/2021)   Received from [facility] visits prior to 04/25/2022.   Social Connection and Isolation Panel    In a typical week, how many  times do you talk on the phone with family, friends, or neighbors?: More than three times a week    How often do you get together with friends or relatives?: Three times a week    How often do you attend church or religious services?: More than 4 times per year    Do you belong to any clubs or organizations such as church groups, unions, fraternal or athletic groups, or school groups?: Yes    How often do you attend meetings of the clubs or organizations you belong to?: More than 4 times per year    Are you married, widowed, divorced, separated, never married, or living with a partner?: Never married  Physicist, Medical Strain: Medium Risk (09/18/2021)   Overall Financial Resource Strain (CARDIA)    Difficulty of Paying Living Expenses: Somewhat hard

## 2024-03-06 LAB — SUSCEPTIBILITY RESULT

## 2024-03-06 LAB — SUSCEPTIBILITY, AER + ANAEROB

## 2024-03-10 LAB — URINE CULTURE: Culture: >=100000 — AB

## 2024-03-11 ENCOUNTER — Telehealth (HOSPITAL_BASED_OUTPATIENT_CLINIC_OR_DEPARTMENT_OTHER): Payer: Self-pay | Admitting: *Deleted

## 2024-03-11 ENCOUNTER — Telehealth (HOSPITAL_BASED_OUTPATIENT_CLINIC_OR_DEPARTMENT_OTHER): Payer: Self-pay | Admitting: Emergency Medicine

## 2024-03-11 NOTE — Progress Notes (Signed)
 ED Antimicrobial Stewardship Positive Culture Follow Up   Micheal Bell is an 54 y.o. male who presented to [facility] on @ADMITDT @ with a chief complaint of  Chief Complaint  Patient presents with   Hematuria    Recent Results (from the past 720 hours)  Urine Culture     Status: Abnormal   Collection Time: 02/26/24  9:01 PM   Specimen: Urine, Clean Catch  Result Value Ref Range Status   Specimen Description   Final    URINE, CLEAN CATCH Performed at [facility], [address], [place], [place] [address]    Special Requests   Final    NONE Performed at [facility], [address], [place], [place] [address]    Culture (A)  Final    >=100,000 COLONIES/mL RALSTONIA SPECIES SEE SEPARATE REPORT Performed at [facility], [address] [place], [place] [address]    Report Status 03/10/2024 FINAL  Final  Susceptibility, Aer + Anaerob     Status: Abnormal   Collection Time: 02/26/24  9:01 PM  Result Value Ref Range Status   Suscept, Aer + Anaerob Final report (A)  Final    Comment: (NOTE) Performed At: [facility] [address] [name], [place] [id number] Jennette Shorter MD Ey:[id number]    Source [facility] URINE  Final    Comment: Performed at [facility], [address], [place], [place] [address]  Susceptibility Result     Status: Abnormal   Collection Time: 02/26/24  9:01 PM  Result Value Ref Range Status   Suscept Result 1 Comment (A)  Final    Comment: (NOTE) Ralstonia mannitolilytica Identification performed by account, not confirmed by this laboratory. Testing performed by broth microdilution.    Antimicrobial Suscept Comment  Final    Comment: (NOTE)      ** S = Susceptible; I = Intermediate; R = Resistant **                   P = Positive; N = Negative            MICS are expressed in micrograms per mL   Antibiotic                 RSLT#1    RSLT#2    RSLT#3    RSLT#4 Amikacin                        R Aztreonam                      R Cefepime                       S Cefotaxime                     S Ceftazidime                    S Ceftriaxone                     S Ciprofloxacin                   S Gentamicin                     R Imipenem  R Levofloxacin                   S Meropenem                      R Minocycline                    S Piperacillin/Tazobactam        S Tetracycline                   S Tobramycin                      R Trimethoprim /Sulfa              S Performed At: [facility] [employer] [address] [place], [place] [id number] Jennette Shorter MD Ey:[id number]     [x]  Culture is from 1/3. I would not suspect cephalexin  to work for this organism. Patient has been seen outpatient since culture though without concern. Recommend symptom check.  Yes > call in cefpodoxime 200 mg BID x 7 days F/u with PCP  No > Notify PCP of findings  ED Provider: Charlyn Dorn Buttner, PharmD, BCPS 03/11/2024 10:52 AM ED Clinical Pharmacist -  [phone]

## 2024-03-11 NOTE — Telephone Encounter (Signed)
 Post ED Visit - Positive Culture Follow-up  Culture report reviewed by antimicrobial stewardship pharmacist: [facility] []  [name], Pharm.D. []  Venetia Gully, Pharm.D., BCPS AQ-ID []  Garrel Crews, Pharm.D., BCPS []  Almarie Lunger, Pharm.D., BCPS []  [place], [address].D., BCPS, AAHIVP []  Rosaline Bihari, Pharm.D., BCPS, AAHIVP []  Vernell Meier, PharmD, BCPS []  Latanya Hint, PharmD, BCPS []  Donald Medley, PharmD, BCPS []  Rocky Bold, PharmD []  Dorothyann Alert, PharmD, BCPS []  Morene Babe, PharmD  [name] []  Rosaline Edison, PharmD []  Romona Bliss, PharmD []  Dolphus Roller, PharmD []  Veva Seip, Rph []  Vernell Daunt) [name], PharmD []  Eva Allis, PharmD []  Rosaline Millet, PharmD []  Iantha Batch, PharmD []  Arvin Gauss, PharmD []  Wanda Hasting, PharmD []  Ronal Rav, PharmD []  Rocky Slade, PharmD []  Bard Jeans, PharmD   Positive urine culture Patient returned call. Reports no symptoms and states he is much better. No further patient follow-up is required at this time.  Micheal Bell 03/11/2024, 8:26 PM

## 2024-03-11 NOTE — Telephone Encounter (Signed)
 Post ED Visit - Positive Culture Follow-up: Unsuccessful Patient Follow-up  Culture assessed and recommendations reviewed by:  [x]  Dorn Buttner, Pharm.D. []  Venetia Gully, Pharm.D., BCPS AQ-ID []  Garrel Crews, Pharm.D., BCPS []  Almarie Lunger, Pharm.D., BCPS []  [place], [place].D., BCPS, AAHIVP []  Rosaline Bihari, Pharm.D., BCPS, AAHIVP []  Massie Rigg, PharmD []  Jodie Rower, PharmD, BCPS  Positive urine culture  Plan:  symptom check.   Yes > call in cefpodoxime 200 mg BID x 7 days F/u with PCP   No > Notify PCP of findings   ED Provider: Charlyn Brow to contact patient, letter will be sent to address on file  Micheal Bell 03/11/2024, 3:20 PM

## 2024-03-16 ENCOUNTER — Encounter: Payer: Self-pay | Admitting: Podiatry

## 2024-03-16 ENCOUNTER — Ambulatory Visit: Admitting: Podiatry

## 2024-03-16 VITALS — Ht 74.0 in | Wt 232.0 lb

## 2024-03-16 DIAGNOSIS — E1142 Type 2 diabetes mellitus with diabetic polyneuropathy: Secondary | ICD-10-CM | POA: Diagnosis not present

## 2024-03-16 DIAGNOSIS — B351 Tinea unguium: Secondary | ICD-10-CM

## 2024-03-16 NOTE — Progress Notes (Signed)
"  °  Subjective:  Patient ID: Micheal Bell, male    DOB: 05-23-1970,  MRN: [id number]  Chief Complaint  Patient presents with   Nail Problem    DFC    54 y.o. male presents with the above complaint. History confirmed with patient.  He returns for follow-up on his thickened elongated nail.  Has not had any issues with callus or skin breakdown  Objective:  Physical Exam: warm, good capillary refill and normal DP and PT pulses. Left Foot: dystrophic yellowed discolored nail plates with subungual debris, no callus or preulcerative lesion today Right Foot: dystrophic yellowed discolored nail plates with subungual debris  Assessment:   1. Onychomycosis   2. Type 2 diabetes mellitus with diabetic polyneuropathy, without long-term current use of insulin  [facility])      Plan:  Patient was evaluated and treated and all questions answered.  Discussed the etiology and treatment options for the condition in detail with the patient.  Recommended debridement of the nails today. Sharp and mechanical debridement performed of all painful and mycotic nails today. Nails debrided in length and thickness using a nail nipper to level of comfort.      Return in about 3 months (around 06/14/2024) for at risk diabetic foot care.   "

## 2024-03-29 ENCOUNTER — Telehealth (HOSPITAL_BASED_OUTPATIENT_CLINIC_OR_DEPARTMENT_OTHER): Payer: Self-pay

## 2024-06-15 ENCOUNTER — Ambulatory Visit: Admitting: Podiatry
